# Patient Record
Sex: Female | Born: 1945 | Race: Black or African American | Hispanic: No | Smoking: Never smoker
Health system: Southern US, Community
[De-identification: ages and names within clinical notes are randomized; demographics above are authoritative.]

## PROBLEM LIST (undated history)

## (undated) DIAGNOSIS — K219 Gastro-esophageal reflux disease without esophagitis: Secondary | ICD-10-CM

## (undated) DIAGNOSIS — R112 Nausea with vomiting, unspecified: Secondary | ICD-10-CM

## (undated) DIAGNOSIS — J45909 Unspecified asthma, uncomplicated: Secondary | ICD-10-CM

## (undated) DIAGNOSIS — T7840XA Allergy, unspecified, initial encounter: Secondary | ICD-10-CM

## (undated) DIAGNOSIS — M1990 Unspecified osteoarthritis, unspecified site: Secondary | ICD-10-CM

## (undated) DIAGNOSIS — Z9889 Other specified postprocedural states: Secondary | ICD-10-CM

## (undated) DIAGNOSIS — R05 Cough: Secondary | ICD-10-CM

## (undated) DIAGNOSIS — G629 Polyneuropathy, unspecified: Secondary | ICD-10-CM

## (undated) DIAGNOSIS — E785 Hyperlipidemia, unspecified: Secondary | ICD-10-CM

## (undated) DIAGNOSIS — I671 Cerebral aneurysm, nonruptured: Secondary | ICD-10-CM

## (undated) DIAGNOSIS — I5032 Chronic diastolic (congestive) heart failure: Secondary | ICD-10-CM

## (undated) DIAGNOSIS — N186 End stage renal disease: Secondary | ICD-10-CM

## (undated) DIAGNOSIS — N189 Chronic kidney disease, unspecified: Secondary | ICD-10-CM

## (undated) DIAGNOSIS — Z992 Dependence on renal dialysis: Secondary | ICD-10-CM

## (undated) DIAGNOSIS — Z86010 Personal history of colonic polyps: Secondary | ICD-10-CM

## (undated) DIAGNOSIS — I10 Essential (primary) hypertension: Secondary | ICD-10-CM

## (undated) DIAGNOSIS — R059 Cough, unspecified: Secondary | ICD-10-CM

## (undated) HISTORY — DX: Allergy, unspecified, initial encounter: T78.40XA

## (undated) HISTORY — DX: Cough, unspecified: R05.9

## (undated) HISTORY — DX: Gastro-esophageal reflux disease without esophagitis: K21.9

## (undated) HISTORY — DX: Essential (primary) hypertension: I10

## (undated) HISTORY — DX: Unspecified osteoarthritis, unspecified site: M19.90

## (undated) HISTORY — PX: NASAL TURBINATE REDUCTION: SHX2072

## (undated) HISTORY — DX: Personal history of colonic polyps: Z86.010

## (undated) HISTORY — DX: Hyperlipidemia, unspecified: E78.5

## (undated) HISTORY — PX: CATARACT EXTRACTION: SUR2

## (undated) HISTORY — DX: Cough: R05

## (undated) HISTORY — PX: OTHER SURGICAL HISTORY: SHX169

---

## 2002-05-26 ENCOUNTER — Encounter: Admission: RE | Admit: 2002-05-26 | Discharge: 2002-08-24 | Payer: Self-pay | Admitting: General Practice

## 2005-03-11 ENCOUNTER — Emergency Department (HOSPITAL_COMMUNITY): Admission: EM | Admit: 2005-03-11 | Discharge: 2005-03-11 | Payer: Self-pay | Admitting: Family Medicine

## 2008-05-15 ENCOUNTER — Ambulatory Visit: Payer: Self-pay | Admitting: Cardiovascular Disease

## 2008-05-15 ENCOUNTER — Observation Stay (HOSPITAL_COMMUNITY): Admission: EM | Admit: 2008-05-15 | Discharge: 2008-05-17 | Payer: Self-pay | Admitting: Emergency Medicine

## 2008-05-17 ENCOUNTER — Encounter: Payer: Self-pay | Admitting: Cardiology

## 2008-06-08 ENCOUNTER — Ambulatory Visit: Payer: Self-pay | Admitting: Cardiovascular Disease

## 2009-10-04 ENCOUNTER — Emergency Department (HOSPITAL_COMMUNITY): Admission: EM | Admit: 2009-10-04 | Discharge: 2009-10-04 | Payer: Self-pay | Admitting: Emergency Medicine

## 2010-06-03 ENCOUNTER — Encounter: Admission: RE | Admit: 2010-06-03 | Discharge: 2010-06-03 | Payer: Self-pay | Admitting: Anesthesiology

## 2010-07-03 DIAGNOSIS — E119 Type 2 diabetes mellitus without complications: Secondary | ICD-10-CM | POA: Insufficient documentation

## 2010-07-03 DIAGNOSIS — R059 Cough, unspecified: Secondary | ICD-10-CM | POA: Insufficient documentation

## 2010-07-03 DIAGNOSIS — R05 Cough: Secondary | ICD-10-CM

## 2010-07-03 DIAGNOSIS — I10 Essential (primary) hypertension: Secondary | ICD-10-CM | POA: Insufficient documentation

## 2010-07-03 DIAGNOSIS — E785 Hyperlipidemia, unspecified: Secondary | ICD-10-CM | POA: Insufficient documentation

## 2010-07-08 ENCOUNTER — Ambulatory Visit: Payer: Self-pay | Admitting: Cardiovascular Disease

## 2010-07-08 ENCOUNTER — Encounter: Payer: Self-pay | Admitting: Cardiovascular Disease

## 2010-07-08 DIAGNOSIS — R079 Chest pain, unspecified: Secondary | ICD-10-CM | POA: Insufficient documentation

## 2010-07-08 DIAGNOSIS — R072 Precordial pain: Secondary | ICD-10-CM | POA: Insufficient documentation

## 2010-07-19 ENCOUNTER — Encounter: Payer: Self-pay | Admitting: Cardiovascular Disease

## 2010-07-19 ENCOUNTER — Ambulatory Visit: Payer: Self-pay

## 2010-08-09 ENCOUNTER — Ambulatory Visit: Payer: Self-pay | Admitting: Cardiovascular Disease

## 2010-08-11 DIAGNOSIS — Z860100 Personal history of colon polyps, unspecified: Secondary | ICD-10-CM | POA: Insufficient documentation

## 2010-08-11 HISTORY — DX: Personal history of colon polyps, unspecified: Z86.0100

## 2010-08-11 HISTORY — DX: Personal history of colonic polyps: Z86.010

## 2010-09-12 NOTE — Assessment & Plan Note (Signed)
Summary: bp high/pcp can't get it under control/mt  Medications Added PRAVASTATIN SODIUM 20 MG TABS (PRAVASTATIN SODIUM) 1 tab at bedtime HYDROCHLOROTHIAZIDE 12.5 MG CAPS (HYDROCHLOROTHIAZIDE) 2 caps once daily AMLODIPINE BESYLATE 5 MG TABS (AMLODIPINE BESYLATE) Take one tablet by mouth daily        Visit Type:  pt last seen 2009 Referring Provider:  Cathlean Cower Primary Provider:  Cathlean Cower  CC:  BP has been running very high and states when this happens she gets cp LUQ...edema/abdomen/fingers....denies any sob.  History of Present Illness: Paula [name] is a pleasant 65 year old African female who is originally from [name] and spends much of her time there each year who was last seen in our office in 2009 and is here today for follow up. She was initially seen by myself in the Emergency Room at [facility] on May 15, 2008, with complaints of chest pain.  She has a past medical history significant for hypertension, hyperlipidemia, diabetes mellitus and chronic cough.  At the time I saw her in the emergency room, she was complaining of a sharp pain occurring under her left breast.  The pain at time that seemed to me like it was a positional type pain and was worsened with coughing.  She underwent a nuclear perfusion stress study while in the hospital there that showed no evidence of ischemia.  She had been on Ace-inhibitor and this was stopped because of a cough.   She is here today for follow up. She tells me that she has had problems with blood pressure control over  the last few months. When her blood pressure goes up, she feel sharp left sided chest pain under her left breast. This only lasts for a few seconds. No associated SOB, palpitations, near syncope or syncope. No visual changes or headaches. Her BP has been as high as [id number] systolically.   Current Medications (verified): 1)  Metformin Hcl 500 Mg Tabs (Metformin Hcl) .Marland Kitchen.. 1 Tab Two Times A Day 2)  Pravastatin  Sodium 20 Mg Tabs (Pravastatin Sodium) .Marland Kitchen.. 1 Tab At Bedtime 3)  Aspirin 81 Mg Tbec (Aspirin) .... Take One Tablet By Mouth Daily 4)  Verapamil Hcl 80 Mg Tabs (Verapamil Hcl) .Marland Kitchen.. 1 Tab  Two Times A Day 5)  Hydrochlorothiazide 12.5 Mg Caps (Hydrochlorothiazide) .... 2 Caps Once Daily  Allergies: 1)  ! Ace Inhibitors  Past History:  Past Medical History: Reviewed history from 07/03/2010 and no changes required. HYPERLIPIDEMIA (ICD-272.4) HYPERTENSION (ICD-401.9) COUGH, CHRONIC (ICD-786.2) DIABETES MELLITUS, TYPE II (ICD-250.00)  Social History: Reviewed history from 07/03/2010 and no changes required. The patient is widowed and lives in [place].  She is   originally from [name].  She is a lifelong nonsmoker and denies use of  alcohol or illicit drugs.  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  Her youngest daughter lives here in [place].     Review of Systems       The patient complains of chest pain.  The patient denies fatigue, malaise, fever, weight gain/loss, vision loss, decreased hearing, hoarseness, palpitations, shortness of breath, prolonged cough, wheezing, sleep apnea, coughing up blood, abdominal pain, blood in stool, nausea, vomiting, diarrhea, heartburn, incontinence, blood in urine, muscle weakness, joint pain, leg swelling, rash, skin lesions, headache, fainting, dizziness, depression, anxiety, enlarged lymph nodes, easy bruising or bleeding, and environmental allergies.    Vital Signs:  Patient profile:   65 year old female Height:      63 inches  Weight:      154.50 pounds BMI:     27.47 Pulse rate:   80 / minute Pulse rhythm:   irregular BP sitting:   150 / 90  (left arm) Cuff size:   large  Vitals Entered By: Julaine Hua, CMA (July 08, 2010 4:30 PM)  Physical Exam  General:  General: Well developed, well nourished, NAD Musculoskeletal: Muscle strength 5/5 all ext Psychiatric: Mood and affect normal Neck: No JVD, no carotid  bruits, no thyromegaly, no lymphadenopathy. Lungs:Clear bilaterally, no wheezes, rhonci, crackles CV: RRR no murmurs, gallops rubs Abdomen: soft, NT, ND, BS present Extremities: No edema, pulses 2+.    EKG  Procedure date:  07/08/2010  Findings:      NSR. Nonspecific T wave flattening.  Poor R wave progression.   Impression & Recommendations:  Problem # 1:  HYPERTENSION (ICD-401.9) Will add Norvasc 5 mg by mouth Qdaily and check renal artery dopplers to exclude renal artery atherosclerosis.   The following medications were removed from the medication list:    Diovan 160 Mg Tabs (Valsartan) .Marland Kitchen... 1 tab once daily Her updated medication list for this problem includes:    Aspirin 81 Mg Tbec (Aspirin) .Marland Kitchen... Take one tablet by mouth daily    Verapamil Hcl 80 Mg Tabs (Verapamil hcl) .Marland Kitchen... 1 tab  two times a day    Hydrochlorothiazide 12.5 Mg Caps (Hydrochlorothiazide) .Marland Kitchen... 2 caps once daily    Amlodipine Besylate 5 Mg Tabs (Amlodipine besylate) .Marland Kitchen... Take one tablet by mouth daily  Orders: EKG w/ Interpretation (93000) Renal Artery Duplex (Renal Artery Duplex)  Problem # 2:  CHEST PAIN-PRECORDIAL (ICD-786.51) Atypical. Likely non-cardiac. Stress test in 2009 without ischemia.  Her updated medication list for this problem includes:    Aspirin 81 Mg Tbec (Aspirin) .Marland Kitchen... Take one tablet by mouth daily    Verapamil Hcl 80 Mg Tabs (Verapamil hcl) .Marland Kitchen... 1 tab  two times a day    Amlodipine Besylate 5 Mg Tabs (Amlodipine besylate) .Marland Kitchen... Take one tablet by mouth daily  Patient Instructions: 1)  Your physician recommends that you schedule a follow-up appointment in: 3-4 weeks. 2)  Your physician recommends that you continue on your current medications as directed. Please refer to the Current Medication list given to you today. 3)  Your physician has requested that you have a renal artery duplex. During this test, an ultrasound is used to evaluate blood flow to the kidneys. Allow one hour  for this exam. Do not eat after midnight the day before and avoid carbonated beverages. Take your medications as you usually do. Prescriptions: AMLODIPINE BESYLATE 5 MG TABS (AMLODIPINE BESYLATE) Take one tablet by mouth daily  #30 x 3   Entered by:   Whitney Jannett Celestine RN   Authorized by:   Lauree Chandler, MD   Signed by:   Mikle Bosworth RN on 07/08/2010   Method used:   Electronically to        [facility] [phone]* (retail)       [place], [place]  [address]       Ph: [phone] or [phone]       Fax: [phone]   RxID:   [id number]

## 2010-09-12 NOTE — Assessment & Plan Note (Signed)
Summary: per check out/sf  Medications Added HYDROCHLOROTHIAZIDE 25 MG TABS (HYDROCHLOROTHIAZIDE) Take one tablet by mouth daily. LORATADINE 10 MG TABS (LORATADINE) once daily TYLENOL 325 MG TABS (ACETAMINOPHEN) as needed CALTRATE 600+D PLUS 600-400 MG-UNIT CHEW (CALCIUM CARBONATE-VIT D-MIN) once daily PATANASE 0.6 % SOLN (OLOPATADINE HCL) 1-2 sprays each nostril two times a day QVAR 80 MCG/ACT AERS (BECLOMETHASONE DIPROPIONATE) 2 puffs once daily VENTOLIN HFA 108 (90 BASE) MCG/ACT AERS (ALBUTEROL SULFATE) [place] as directed      Allergies Added:   Visit Type:  Follow-up Referring Provider:  Cathlean Cower Primary Provider:  Cathlean Cower  CC:  no complaints.  History of Present Illness: Ms. Passarella is a pleasant 65 year old African female who is originally from [name] and spends much of her time there each year and is here today for follow up. She was initially seen by myself in the Emergency Room at [facility] on May 15, 2008, with complaints of chest pain.  She has a past medical history significant for hypertension, hyperlipidemia, diabetes mellitus and chronic cough.  At the time I saw her in the emergency room, she was complaining of a sharp pain occurring under her left breast.  The pain at time that seemed to me like it was a positional type pain and was worsened with coughing.  She underwent a nuclear perfusion stress study while in the hospital there that showed no evidence of ischemia.  She had been on Ace-inhibitor and this was stopped because of a cough.   She is here today for follow I last saw her in November 2011 and started her on Norvasc for uncontrolled BP. Her BP is much better. We also checked renal artery dopplers which were normal. She has  no complaints today except for chronic cough , unchanged since 1994 per pt. She had been on an Ace-inhibitor but no change in cough since it was stopped.   Current Medications (verified): 1)  Metformin Hcl 500 Mg Tabs  (Metformin Hcl) .Marland Kitchen.. 1 Tab Two Times A Day 2)  Pravastatin Sodium 20 Mg Tabs (Pravastatin Sodium) .Marland Kitchen.. 1 Tab At Bedtime 3)  Aspirin 81 Mg Tbec (Aspirin) .... Take One Tablet By Mouth Daily 4)  Verapamil Hcl 80 Mg Tabs (Verapamil Hcl) .Marland Kitchen.. 1 Tab  Two Times A Day 5)  Hydrochlorothiazide 25 Mg Tabs (Hydrochlorothiazide) .... Take One Tablet By Mouth Daily. 6)  Amlodipine Besylate 5 Mg Tabs (Amlodipine Besylate) .... Take One Tablet By Mouth Daily 7)  Loratadine 10 Mg Tabs (Loratadine) .... Once Daily 8)  Tylenol 325 Mg Tabs (Acetaminophen) .... As Needed 9)  Caltrate 600+d Plus 600-400 Mg-Unit Chew (Calcium Carbonate-Vit D-Min) .... Once Daily 10)  Patanase 0.6 % Soln (Olopatadine Hcl) .Marland Kitchen.. 1-2 Sprays Each Nostril Two Times A Day 11)  Qvar 80 Mcg/act Aers (Beclomethasone Dipropionate) .... 2 Puffs Once Daily 12)  Ventolin Hfa 108 (90 Base) Mcg/act Aers (Albuterol Sulfate) .... [place] As Directed  Allergies (verified): 1)  ! Ace Inhibitors  Past History:  Past Medical History: Reviewed history from 07/03/2010 and no changes required. HYPERLIPIDEMIA (ICD-272.4) HYPERTENSION (ICD-401.9) COUGH, CHRONIC (ICD-786.2) DIABETES MELLITUS, TYPE II (ICD-250.00)  Social History: Reviewed history from 07/03/2010 and no changes required. The patient is widowed and lives in [place].  She is   originally from [name].  She is a lifelong nonsmoker and denies use of  alcohol or illicit drugs.  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  Review of Systems  The patient denies fatigue, malaise, fever, weight gain/loss, vision loss, decreased hearing, hoarseness, chest pain, palpitations, shortness of breath, prolonged cough, wheezing, sleep apnea, coughing up blood, abdominal pain, blood in stool, nausea, vomiting, diarrhea, heartburn, incontinence, blood in urine, muscle weakness, joint pain, leg swelling, rash, skin lesions, headache,  fainting, dizziness, depression, anxiety, enlarged lymph nodes, easy bruising or bleeding, and environmental allergies.         Cough  Vital Signs:  Patient profile:   65 year old female Height:      63 inches Weight:      153.50 pounds BMI:     27.29 Pulse rate:   72 / minute BP sitting:   112 / 80  (left arm) Cuff size:   regular  Vitals Entered By: Hansel Feinstein CMA (August 09, 2010 9:40 AM)  Physical Exam  General:  General: Well developed, well nourished, NAD Musculoskeletal: Muscle strength 5/5 all ext Psychiatric: Mood and affect normal Neck: No JVD, no carotid bruits, no thyromegaly, no lymphadenopathy. Lungs:Clear bilaterally, no wheezes, rhonci, crackles CV: RRR no murmurs, gallops rubs Abdomen: soft, NT, ND, BS present Extremities: No edema, pulses 2+.    Arterial Doppler  Procedure date:  07/19/2010  Findings:      Normal renal arteries bilaterally, normal kidney size bilaterally. Normal abdominal aorta.   Impression & Recommendations:  Problem # 1:  HYPERTENSION (ICD-401.9) BP well controlled. Continue current therapy.   Her updated medication list for this problem includes:    Aspirin 81 Mg Tbec (Aspirin) .Marland Kitchen... Take one tablet by mouth daily    Verapamil Hcl 80 Mg Tabs (Verapamil hcl) .Marland Kitchen... 1 tab  two times a day    Hydrochlorothiazide 25 Mg Tabs (Hydrochlorothiazide) .Marland Kitchen... Take one tablet by mouth daily.    Amlodipine Besylate 5 Mg Tabs (Amlodipine besylate) .Marland Kitchen... Take one tablet by mouth daily  Patient Instructions: 1)  Your physician recommends that you schedule a follow-up appointment in: 1 year with Dr. Angelena Form 2)  Your physician recommends that you continue on your current medications as directed. Please refer to the Current Medication list given to you today. Prescriptions: AMLODIPINE BESYLATE 5 MG TABS (AMLODIPINE BESYLATE) Take one tablet by mouth daily  #30 x 11   Entered by:   Joelyn Oms RN   Authorized by:   Lauree Chandler, MD    Signed by:   Joelyn Oms RN on 08/09/2010   Method used:   Electronically to        [facility] [phone]* (retail)       [address] [place], [place]  [address]       Ph: [phone] or [phone]       Fax: [phone]   RxID:   [id number]

## 2010-10-30 LAB — DIFFERENTIAL
Basophils Absolute: 0.1 10*3/uL (ref 0.0–0.1)
Basophils Relative: 1 % (ref 0–1)
Eosinophils Absolute: 0.1 10*3/uL (ref 0.0–0.7)
Eosinophils Relative: 2 % (ref 0–5)
Lymphocytes Relative: 32 % (ref 12–46)
Lymphs Abs: 1.9 10*3/uL (ref 0.7–4.0)
Monocytes Absolute: 0.5 10*3/uL (ref 0.1–1.0)
Monocytes Relative: 8 % (ref 3–12)
Neutro Abs: 3.2 10*3/uL (ref 1.7–7.7)
Neutrophils Relative %: 57 % (ref 43–77)

## 2010-10-30 LAB — URINALYSIS, ROUTINE W REFLEX MICROSCOPIC
Bilirubin Urine: NEGATIVE
Glucose, UA: NEGATIVE mg/dL
Hgb urine dipstick: NEGATIVE
Ketones, ur: NEGATIVE mg/dL
Nitrite: NEGATIVE
Protein, ur: 30 mg/dL — AB
Specific Gravity, Urine: 1.01 (ref 1.005–1.030)
Urobilinogen, UA: 0.2 mg/dL (ref 0.0–1.0)
pH: 8 (ref 5.0–8.0)

## 2010-10-30 LAB — BASIC METABOLIC PANEL
BUN: 17 mg/dL (ref 6–23)
CO2: 30 mEq/L (ref 19–32)
Calcium: 9.6 mg/dL (ref 8.4–10.5)
Chloride: 101 mEq/L (ref 96–112)
Creatinine, Ser: 0.95 mg/dL (ref 0.4–1.2)
GFR calc Af Amer: >60 mL/min (ref >60)
GFR calc non Af Amer: 59 mL/min — ABNORMAL LOW (ref >60)
Glucose, Bld: 92 mg/dL (ref 70–99)
Potassium: 3.6 mEq/L (ref 3.5–5.1)
Sodium: 140 mEq/L (ref 135–145)

## 2010-10-30 LAB — POCT CARDIAC MARKERS
CKMB, poc: <1 ng/mL — ABNORMAL LOW (ref 1.0–8.0)
Myoglobin, poc: 75 ng/mL (ref 12–200)
Troponin i, poc: <0.05 ng/mL (ref 0.00–0.09)

## 2010-10-30 LAB — HEPATIC FUNCTION PANEL
ALT: 20 U/L (ref 0–35)
AST: 24 U/L (ref 0–37)
Albumin: 4.2 g/dL (ref 3.5–5.2)
Alkaline Phosphatase: 56 U/L (ref 39–117)
Bilirubin, Direct: <0.1 mg/dL (ref 0.0–0.3)
Total Bilirubin: 0.7 mg/dL (ref 0.3–1.2)
Total Protein: 7.3 g/dL (ref 6.0–8.3)

## 2010-10-30 LAB — RAPID URINE DRUG SCREEN, HOSP PERFORMED
Amphetamines: NOT DETECTED
Barbiturates: NOT DETECTED
Benzodiazepines: NOT DETECTED
Cocaine: NOT DETECTED
Opiates: NOT DETECTED
Tetrahydrocannabinol: NOT DETECTED

## 2010-10-30 LAB — CBC
HCT: 39.6 % (ref 36.0–46.0)
Hemoglobin: 13.5 g/dL (ref 12.0–15.0)
MCHC: 34 g/dL (ref 30.0–36.0)
MCV: 87.3 fL (ref 78.0–100.0)
Platelets: 193 10*3/uL (ref 150–400)
RBC: 4.54 MIL/uL (ref 3.87–5.11)
RDW: 13.3 % (ref 11.5–15.5)
WBC: 5.8 10*3/uL (ref 4.0–10.5)

## 2010-10-30 LAB — URINE MICROSCOPIC-ADD ON

## 2010-10-30 LAB — URINE CULTURE
Colony Count: NO GROWTH
Culture: NO GROWTH

## 2010-10-30 LAB — LIPASE, BLOOD: Lipase: 50 U/L (ref 11–59)

## 2010-10-30 LAB — CK TOTAL AND CKMB (NOT AT ARMC)
CK, MB: 1.4 ng/mL (ref 0.3–4.0)
Relative Index: 0.9 (ref 0.0–2.5)
Total CK: 160 U/L (ref 7–177)

## 2010-10-30 LAB — TROPONIN I: Troponin I: 0.01 ng/mL (ref 0.00–0.06)

## 2010-12-24 NOTE — H&P (Signed)
NAME:  Paula [name], Paula [name]           ACCOUNT NO.:  [id number]   MEDICAL RECORD NO.:  [id number]          PATIENT TYPE:  EMS   LOCATION:  ED                           FACILITY:  [facility]   PHYSICIAN:  Jacquelynn Cree, M.D.   DATE OF BIRTH:  02-23-1946   DATE OF ADMISSION:  05/15/2008  DATE OF DISCHARGE:                              HISTORY & PHYSICAL   PRIMARY CARE PHYSICIAN:  Cathlean Cower, MD.   CHIEF COMPLAINT:  Chest pain.   HISTORY OF PRESENT ILLNESS:  The patient is a 65 year old female with a  2 day history of chest pain.  The patient describes the pain as coming  on suddenly while asleep at 1 a.m. in the morning 2 nights ago.  She  describes the pain as left-sided and sharp.  It was associated with  difficulty breathing and got worse when she took deep breaths in.  It  was also worse with bending down.  The pain later became heavy and she  described it as a level 8/10 at its worst.  She states the pain did not  radiate anywhere when it first came on.  She subsequently took aspirin  and the pain diminished somewhat.  Over the weekend, the patient took  aspirin twice a day which seemed to keep the pain under some control,  but has become concerned because of an increasing sense of heaviness in  her chest.  She also describes some associated head pain and right arm  pain now.  States that when the pain first started, I could not move.  There was no associated diaphoresis, nausea or vomiting.   PAST MEDICAL HISTORY:  1. Hypertension.  2. Diabetes.  3. Dyslipidemia.  4. Chronic cough with postnasal drip.   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   FAMILY HISTORY:  The patient's mother died at 29 from old age.  She also  had cerebrovascular disease with history of stroke and hypertension  prior to her death.  The patient's father died at 36 from a stomach  related illness.  She has 1 brother who is healthy and 1 sister who is  healthy.  One other sister is bed ridden and has asthma.   SOCIAL HISTORY:  The patient is widowed.  She lives in [place], but  is originally from [name].  She is a lifelong nonsmoker.  She denies  alcohol or drug use.  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   ALLERGIES:  CHLOROQUINE causes a pruritus/rash.   CURRENT MEDICATIONS:  1. Metformin 500 mg b.i.d.  2. Lisinopril 20 mg daily.  3. Pravastatin 20 mg daily.  4. Aspirin p.r.n.   REVIEW OF SYSTEMS:  The patient has chronic cough with clear sputum.  This is occasionally associated with bouts of vomiting.  Chronic ear  pain.  No fever or chills.  Otherwise, a 14-point review of systems is  unremarkable.   PHYSICAL EXAMINATION:  VITAL SIGNS:  Temperature 98.4, pulse 73,  respirations 18, blood pressure 156/82, O2 saturation 100% on room air.  GENERAL:  Well-developed, slightly obese female who is in no acute  distress.  HEENT:  Normocephalic, atraumatic.  PERRL.  EOMI.  Oropharynx is clear.  NECK:  Supple, no thyromegaly, no lymphadenopathy, no jugular venous  distention.  CHEST:  Lungs clear to auscultation bilaterally with good  air movement.  HEART:  Regular rate and rhythm.  No murmurs, rubs, or gallops.  ABDOMEN:  Soft, nontender, nondistended with normoactive bowel sounds.  EXTREMITIES:  No clubbing, edema, or cyanosis.  SKIN:  Warm and dry.  No rashes.  NEUROLOGIC:  The patient is alert and oriented x3.  Cranial nerves II-  XII are grossly intact.  Nonfocal.   DIAGNOSTICS:  1. Chest x-ray shows no active cardiopulmonary disease.  2. A 12-lead EKG shows normal sinus rhythm at 74 beats per minute.  No      ST/T wave abnormalities appreciable.   LABORATORY DATA:  Sodium is 139, potassium 4.1, chloride 106, bicarb 23,  BUN 22, creatinine 1.14, glucose 144, white blood cell count is 5.1,  hemoglobin 13.3, hematocrit 39.9, platelets 185, D-dimer is 0.24.  Point  of care  cardiac markers are negative x1.   ASSESSMENT/PLAN:  1. Chest pain:  The patient has multiple risk factors including      hypertension, diabetes, and dyslipidemia along with obesity.  We      will therefore need a cardiology consultation for consideration of      stress test at a minimum.  We will admit the patient to a telemetry      unit and rule out acute coronary syndrome with serial enzymes.  Her      first set of cardiac markers in the ER is negative.  We will put      her on aspirin, p.r.n. morphine and nitroglycerin for recurrent      chest pain.  We will check a fasting lipid panel to make sure her      lipids are well controlled.  2. Hypertension:  The patient's blood pressure is currently      suboptimally controlled.  We will monitor this over the next 24      hours and if needed adjust her antihypertensives.  3. Dyslipidemia:  The patient will be continued on her usual dose of      statin therapy.  We will check a fasting lipid panel to ensure that      she is well controlled on this dose.  4. Diabetes:  We will check the patient's hemoglobin A1c value to      determine her overall glycemic control.  We will hold her metformin      as she may need a contrasted study while in the hospital.  We will      put her on sliding-scale insulin  5. Chronic cough:  We will treat this with Delsym and Protonix.  6. Prophylaxis:  We will initiate DVT prophylaxis with subcutaneous      heparin.  We will put her on proton pump inhibitor therapy for      treatment of her chronic cough and for stress ulcer prophylaxis.      Jacquelynn Cree, M.D.  Electronically Signed     CR/MEDQ  D:  05/15/2008  T:  05/16/2008  Job:  [id number]

## 2010-12-24 NOTE — Consult Note (Signed)
NAME:  ITZAYANA, PILLER           ACCOUNT NO.:  [id number]   MEDICAL RECORD NO.:  [id number]          PATIENT TYPE:  OBV   LOCATION:  0106                         FACILITY[facility]  [facility]   PHYSICIAN:  Lauree Chandler, MDDATE OF BIRTH:  05-02-1946   DATE OF CONSULTATION:  05/15/2008  DATE OF DISCHARGE:                                 CONSULTATION   Chest pain.   HISTORY OF PRESENT ILLNESS:  Ms. Rajput is a pleasant 65 year old  female with a two-day history of left-sided chest pain that she first  noticed while asleep several days ago.  She describes the pain as being  very sharp and occurring underneath her left breast.  There does not  seem to be any radiation of the pain.  There is a mild association with  shortness of breath but no association with palpitations, diaphoresis,  nausea, or vomiting.  The pain has not radiated.  There is no resolution  of the pain while at rest.  There does seem to be a positional component  to the left-sided pain.  It seems to be worse when she is bending over.  She states that she has had pain like this several times before;  however, her pain is usually more of a dull pain.  She says that she  took several aspirin and had no relief of the pain and presented to the  emergency department.  Currently, she describes a very mild, dull pain  over the left chest wall underneath the left breast.  She denies any  lower extremity edema, PND, orthopnea, dizziness, near syncope, or  syncope.   PAST MEDICAL HISTORY:  1. Hypertension.  2. Diabetes mellitus.  3. Hyperlipidemia.  4. Chronic cough.  5. Removal of bilateral benign masses in the axilla.   ALLERGIES:  CHLORAQUIN, which caused a rash.   MEDICATIONS:  1. Metformin 500 mg twice daily.  2. Lisinopril 20 mg once daily.  3. Pravastatin 20 mg once daily.  4. Aspirin p.r.n.   SOCIAL HISTORY:  The patient is widowed and lives in [place].  She is  originally from [name].  She is a lifelong  nonsmoker and denies the use  of alcohol or illicit drugs.  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   FAMILY HISTORY:  Patient's mother died at 77 from natural causes but did  have a stroke and hypertension.  Father died at age 31 from stomach-  related reasons.  She has one brother who is healthy and two sisters.  One of her sisters is bedridden with chronic lung disease, and the other  is healthy.   REVIEW OF SYSTEMS:  Positive for chronic cough productive of clear  sputum.  She denies any fevers or chills.  Otherwise, as noted in the  history of present illness.   PHYSICAL EXAMINATION:  Temperature 98.4, pulse 73 and regular,  respirations 18 and nonlabored.  Blood pressure 166/82.  Oxygen  saturations of 100% on room air.  GENERAL:  Alert and oriented x3 in no acute distress.  NEURO:  Nonfocal.  Cranial nerves II-XII are grossly intact.  NECK:  No JVD.  No carotid bruits.  No lymphadenopathy.  No thyromegaly.  SKIN:  Warm and dry.  Oropharynx clear.  Mucous membranes moist.  LUNGS:  Clear to auscultation bilaterally with no wheezes, rhonchi, or  crackles noted.  CARDIOVASCULAR:  Regular rate and rhythm without murmurs, rubs or  gallops noted.  ABDOMEN:  Soft, nontender, nondistended.  Bowel sounds are present.  EXTREMITIES:  No evidence of edema.  Pulses are 2+ in all extremities.   DIAGNOSTIC STUDIES:  EKG obtained here in the emergency department with  normal sinus rhythm with a ventricular rate of 74 beats per minute and  no ischemic changes.   Chest x-ray:  No active cardiopulmonary disease.   Laboratory values from admission show a sodium of 139, potassium 4.1,  creatinine 1.14, hemoglobin 13.3, platelets 185.  White blood cell count  5.1.  D-dimer 0.24.  Troponin less than 0.05.   ASSESSMENT/PLAN:  This is a pleasant 65 year old African female with  several risk factors for coronary artery disease, including  hypertension, hyperlipidemia, and  diabetes mellitus, who presents to the  emergency department with complaints of two days of atypical left-sided  chest discomfort.  I agree that it would be most appropriate to admit  her to telemetry and rule out a myocardial infarction with serial  cardiac enzymes.  Her symptoms sound atypical, but her risk factors are  significant.  If she rules out for a myocardial infarction, then I think  it would be appropriate to perform an exercise treadmill nuclear stress  test tomorrow prior to discharge.  If she does rule in for a myocardial  infarction, then we would plan on transferring her over to [facility]  for a left heart catheterization tomorrow.      Lauree Chandler, MD  Electronically Signed     CM/MEDQ  D:  05/15/2008  T:  05/15/2008  Job:  [id number]

## 2010-12-24 NOTE — Discharge Summary (Signed)
NAME:  Paula [name], COMTE           ACCOUNT NO.:  [id number]   MEDICAL RECORD NO.:  [id number]          PATIENT TYPE:  OBV   LOCATION:  [id number]                         FACILITY:  [facility]   PHYSICIAN:  Jacquelynn Cree, M.D.   DATE OF BIRTH:  11-01-1945   DATE OF ADMISSION:  05/15/2008  DATE OF DISCHARGE:  05/17/2008                               DISCHARGE SUMMARY   PRIMARY CARE PHYSICIAN:  Dr. Amadeo Garnet.   CARDIOLOGIST:  Dr. Darlina Guys.   DISCHARGE DIAGNOSES:  1. Noncardiac chest pain.  2. Hypertension.  3. Dyslipidemia.  4. Diabetes mellitus.  5. Chronic cough.   DISCHARGE MEDICATIONS:  1. Metformin 500 mg b.i.d.  2. Lisinopril 20 mg daily.  3. Pravastatin 40 mg daily.  4. Nifedipine 20 mg t.i.d.  5. Protonix 40 mg daily.  6. Delsym 60 mg q.12 hours.  7. Aspirin 81 mg daily.   CONSULTATIONS:  1. Dr. Lauree Chandler for Cardiology.   BRIEF ADMISSION HPI:  The patient is a 65 year old female with multiple  cardiac risk factors who presented to the hospital with chief complaints  of chest pain.  For the full details, please see my dictated H&P.   PROCEDURES AND DIAGNOSTIC STUDIES:  1. Chest X-Ray:  On May 15, 2008 showed no active cardiopulmonary      disease.  2. Stress Myoview done on May 17, 2008:  Images pending.  No      complaints of chest pain or EKG changes during the test.   DISCHARGE LABORATORY VALUES:  Sodium was 142, potassium 3.6, chloride  111, bicarb 24, BUN 18, creatinine 1.12, glucose 104.  Cardiac markers  were negative x3.   HOSPITAL COURSE BY PROBLEM:  1. Chest pain:  The patient was admitted to the telemetry unit and      monitored closely.  Acute coronary syndrome was ruled out by three      sets of negative enzymes.  Because of her cardiac risk factor      profile, a cardiology consultation was requested and kindly      provided by the [facility].  They did recommend a stress test      and this was completed on May 17, 2008.  At this  point the      images are pending but the preliminary reading certainly appears to      be normal.  As soon as we get confirmation that the stress test is      negative, she will be discharged home.  Continued risk factor      modification will be stressed to the patient on discharge.  2. Hypertension:  The patient's lisinopril was resumed.  3. Dyslipidemia:  The patient's total cholesterol was well controlled      at 183 but her HDL was low at 33 and her LDL was high at 124.  We      have increased her pravastatin to 40 mg daily.  She should have a      follow-up fasting lipid panel and check of her liver function      studies in 6 weeks.  4. Diabetes:  The patient has excellent outpatient diabetic control      given her hemoglobin A1c of 6.5.  She should continue on metformin      at discharge.  She was covered with sliding scale insulin while in      the hospital in case she had to have a contrasted study.  .  5. Chronic cough:  The patient was put on a combination of Delsym and      Protonix which seems to have improved her cough.  She will be      discharged on these medicines.  If she continues to have problems      with cough, consideration for changing her ACE inhibitor to an ARB      can be made by her primary care physician.   DISPOSITION:  Patient is medically stable and will be discharged home  soon as the final stress test results are available.  She has follow-up  scheduled with Dr. Angelena Form on October 29 at 10:30 a.m.  She should  follow up with her primary care physician in 1-2 weeks.      Jacquelynn Cree, M.D.  Electronically Signed     CR/MEDQ  D:  05/17/2008  T:  05/17/2008  Job:  [id number]   cc:   Lauree Chandler, MD  [address] [place] [address]

## 2010-12-24 NOTE — Assessment & Plan Note (Signed)
[facility] OFFICE NOTE   NAME:Paula [name], Paula [name]                  MRN:          [id number]  DATE:06/08/2008                            DOB:          [birth year]    PRIMARY CARE PHYSICIAN:  Cathlean Cower, MD   HISTORY OF PRESENT ILLNESS:  Ms. Paula [name] is a pleasant 65 year old  African female who is originally from [name] and spends much of her time  their each year who was initially seen by myself in the Emergency Room  at [facility] on May 15, 2008, with complaints of chest  pain.  She has a past medical history significant for hypertension,  hyperlipidemia, diabetes mellitus and chronic cough.  At the time I saw  her in the emergency room, she was complaining of a sharp pain occurring  under her left breast.  There was no radiation of the pain.  There was a  mild association with shortness of breath at that time, but no  associated palpitations, diaphoresis, nausea, or vomiting.  The pain at  time that seemed to me like it was a positional type pain and was  worsened with coughing.  She underwent a nuclear perfusion stress study  while in the hospital there that showed no evidence of ischemia.  She  comes into my office today for a hospital followup.  She tells me that  she has had no recurrence of her chest pain, but does occasionally  notice very mild pain under her left breast when she had deep coughing.  Her only complaint today really is a severe cough for the last several  months.  This is a nonproductive cough.  Occasionally, if she does  produce sputum, it is clear.  She thinks that is mostly associated with  postnasal drip, but tells me that she has been on an ACE inhibitor for  some time.  She has no other complaints today.  She denies palpitations,  dizziness, near syncope, syncope, abdominal pain, nausea, vomiting,  diaphoresis, orthopnea, PND, or lower extremity edema.   PAST  MEDICAL HISTORY:  1. Hypertension.  2. Hyperlipidemia.  3. Diabetes mellitus.  4. Chronic cough, on ACE inhibitor therapy.  5. Removal of bilateral benign masses in the axilla.   ALLERGIES:  CHLOROQUINE, which causes a rash.   CURRENT MEDICATIONS:  1. Metformin 500 mg twice daily.  2. Lisinopril 20 mg once daily.  3. Pravastatin 40 mg once daily.  4. Protonix 40 mg once daily.  5. Enteric-coated aspirin 81 mg once daily.  6. Verapamil 80 mg twice daily.  7. Metoclopramide 5 mg 3 times daily.  8. Ranitidine 300 mg once daily.   SOCIAL HISTORY:  The patient is widowed and lives in [place].  She is  originally from [name].  She is a lifelong nonsmoker and denies use of  alcohol or illicit drugs.  She is currently disabled, but used to work  as a Education officer, museum.  Her youngest daughter lives here in [place].   FAMILY HISTORY:  The patient's mother died at 45 from natural causes,  but did have a stroke  and hypertension.  Her father died at age 44 from  stomach related reasons.  She has 1 brother who is healthy and 2  sisters.  One for sisters is bedridden with chronic lung disease and  other is healthy.   REVIEW OF SYSTEMS:  As stated in history of present illness and is  otherwise negative.  The only positives in her review of systems are  currently the chronic cough, which is productive of clear sputum.  She  denies any fevers or chills.   PHYSICAL EXAMINATION:  VITAL SIGNS:  Blood pressure 182/93 (on recheck  it was 170/90), pulse 69 and regular, respirations 12 and unlabored,  weight 154 pounds.  GENERAL:  She is a pleasant 65 year old African female who is in no  acute distress.  She is alert and oriented x3.  SKIN:  Warm and dry.  HEENT:  Normal.  NEUROLOGICAL:  No focal neurological deficits.  PSYCHIATRIC:  Mood and affect are appropriate.  MUSCULOSKELETAL:  Muscle strength and tone is normal.  LUNGS:  Clear to auscultation bilaterally with no wheezes, rhonchi, or   crackles noted.  CARDIOVASCULAR:  Regular rate and rhythm without murmurs, gallops, or  rubs noted.  ABDOMEN:  Soft, nontender, and nondistended.  Bowel sounds are present.  EXTREMITIES:  No evidence of edema.  Pulses are 2+ in all extremities.   DIAGNOSTIC STUDIES:  1. A 12-lead EKG shows normal sinus rhythm with first-degree AV block.      The PR interval is 216 milliseconds.  There is poor R-wave      progression through the precordial leads.  2. Exercise myocardial perfusion study performed on May 17, 2008,      shows no evidence of inducible ischemia.  The left ventricular wall      motion and systolic function was normal.   ASSESSMENT AND PLAN:  This is a pleasant 65 year old African female with  prior history of diabetes mellitus, hypertension and hyperlipidemia who  is seen today for hospital followup after admission with complaints of  left-sided chest pain.  As noted above, the patient's stress test showed  no evidence of inducible ischemia.  Her overall left ventricular  function was normal on the nuclear study.  She has had no recurrence of  chest pain.  I think that most of her complaints of chest discomfort are  related to coughing, which is most likely related to her ACE inhibitor.  I will stop her ACE inhibitor today and we will start her on Diovan 160  mg once daily.  I will also start her on hydrochlorothiazide 12.5 mg  once daily for her significantly elevated blood pressure.  I do not  think that any further cardiac workup is needed at the current time.  She will continue to  follow up with her primary care physician and we will have further  titration of her antihypertensive medications in the primary care  office.     Lauree Chandler, MD  Electronically Signed    CM/MedQ  DD: 06/08/2008  DT: 06/09/2008  Job #: [id number]   cc:   Cathlean Cower, MD

## 2011-01-13 ENCOUNTER — Other Ambulatory Visit: Payer: Self-pay | Admitting: Allergy and Immunology

## 2011-01-13 ENCOUNTER — Ambulatory Visit
Admission: RE | Admit: 2011-01-13 | Discharge: 2011-01-13 | Disposition: A | Discharge status: Home / Self Care | Payer: Medicaid Other | Source: Ambulatory Visit | Attending: Allergy and Immunology | Admitting: Allergy and Immunology

## 2011-01-13 DIAGNOSIS — J329 Chronic sinusitis, unspecified: Secondary | ICD-10-CM

## 2011-04-07 ENCOUNTER — Encounter: Payer: Self-pay | Admitting: Pulmonary Disease

## 2011-04-08 ENCOUNTER — Encounter: Payer: Self-pay | Admitting: Critical Care Medicine

## 2011-04-08 ENCOUNTER — Ambulatory Visit (INDEPENDENT_AMBULATORY_CARE_PROVIDER_SITE_OTHER): Payer: Medicaid Other | Admitting: Critical Care Medicine

## 2011-04-08 DIAGNOSIS — E119 Type 2 diabetes mellitus without complications: Secondary | ICD-10-CM

## 2011-04-08 DIAGNOSIS — K219 Gastro-esophageal reflux disease without esophagitis: Secondary | ICD-10-CM | POA: Insufficient documentation

## 2011-04-08 DIAGNOSIS — R059 Cough, unspecified: Secondary | ICD-10-CM

## 2011-04-08 DIAGNOSIS — E785 Hyperlipidemia, unspecified: Secondary | ICD-10-CM

## 2011-04-08 DIAGNOSIS — I10 Essential (primary) hypertension: Secondary | ICD-10-CM

## 2011-04-08 DIAGNOSIS — R05 Cough: Secondary | ICD-10-CM

## 2011-04-08 MED ORDER — METOCLOPRAMIDE HCL 10 MG PO TABS: ORAL_TABLET | ORAL | Status: DC

## 2011-04-08 MED ORDER — HYDROCOD POLST-CPM POLST ER 10-8 MG PO CP12: 1.0000 | ORAL_CAPSULE | Freq: Two times a day (BID) | ORAL | Status: DC | PRN

## 2011-04-08 MED ORDER — BENZONATATE 100 MG PO CAPS: ORAL_CAPSULE | ORAL | Status: AC

## 2011-04-08 MED ORDER — DEXLANSOPRAZOLE 60 MG PO CPDR: 60.0000 mg | DELAYED_RELEASE_CAPSULE | Freq: Every day | ORAL | Status: DC

## 2011-04-08 NOTE — Patient Instructions (Signed)
Stop omeprazole Start Dexilant one daily Start Reglan one 4 times a day with meals and at bedtime Stop Qvar Use cyclic cough protocol Reflux diet

## 2011-04-08 NOTE — Progress Notes (Addendum)
Subjective:    Patient ID: Paula [name], female    DOB: 1946/07/18, 65 y.o.   MRN: [id number] 65 y.o. AAF with cyclical cough d/t GERD, upper airway instability HPI Comments: Cough since 1993.  Coughing seasonally , but now coughing constantly, phlegm into nose and mouth. Saw ENT and was neg w/u  Cough This is a chronic problem. The current episode started more than 1 year ago. The problem has been gradually worsening. The problem occurs constantly. The cough is productive of sputum (mucus is clear , no blood). Associated symptoms include ear congestion, ear pain, headaches, heartburn, nasal congestion, postnasal drip, rhinorrhea, a sore throat, shortness of breath and wheezing. Pertinent negatives include no chest pain, chills, fever, hemoptysis, myalgias or rash. Associated symptoms comments: Has a dry scratchy throat If eats food will regurgitate . The symptoms are aggravated by stress, dust, fumes, pollens, lying down and exercise (allergy to mold and dust, coughs all night long). She has tried steroid inhaler and a beta-agonist inhaler for the symptoms. The treatment provided no relief. Her past medical history is significant for environmental allergies. There is no history of asthma, bronchitis, COPD, emphysema or pneumonia.   Past Medical History  Diagnosis Date  . Allergic rhinitis   . Cough   . GERD (gastroesophageal reflux disease)   . Diabetes mellitus   . Hypertension   . Hyperlipidemia      Family History  Problem Relation Age of Onset  . Asthma Sister      History   Social History  . Marital Status: Widowed    Spouse Name: N/A    Number of Children: N/A  . Years of Education: N/A   Occupational History  . Not on file.   Social History Main Topics  . Smoking status: Never Smoker   . Smokeless tobacco: Never Used  . Alcohol Use: No  . Drug Use: No  . Sexually Active: Not on file   Other Topics Concern  . Not on file   Social History Narrative  . No  narrative on file     Allergies  Allergen Reactions  . Ace Inhibitors     REACTION: chronic cough     Outpatient Prescriptions Prior to Visit  Medication Sig Dispense Refill  . albuterol (VENTOLIN HFA) 108 (90 BASE) MCG/ACT inhaler Inhale 2 puffs into the lungs every 6 (six) hours as needed.        Marland Kitchen amLODipine (NORVASC) 5 MG tablet Take 5 mg by mouth daily.        Marland Kitchen aspirin 81 MG tablet Take 81 mg by mouth daily.        . verapamil (CALAN) 80 MG tablet Take 80 mg by mouth 2 (two) times daily.       . beclomethasone (QVAR) 80 MCG/ACT inhaler Inhale 2 puffs into the lungs 2 (two) times daily.        . Calcium-Vitamin D (CALTRATE 600 PLUS-VIT D PO)        . hydrochlorothiazide (,MICROZIDE/HYDRODIURIL,) 12.5 MG capsule Take 12.5 mg by mouth daily.        Marland Kitchen loratadine (CLARITIN) 10 MG tablet Take 10 mg by mouth daily.        . metFORMIN (GLUCOPHAGE) 850 MG tablet Take 850 mg by mouth 2 (two) times daily with a meal.        . Olopatadine HCl (PATANASE) 0.6 % SOLN Place 2 sprays into the nose 2 (two) times daily.       Marland Kitchen  Pseudoeph-Chlorphen-Hydrocod (ZUTRIPRO) 60-4-5 MG/5ML SOLN             Review of Systems  Constitutional: Positive for fatigue. Negative for fever, chills, diaphoresis, activity change, appetite change and unexpected weight change.  HENT: Positive for ear pain, congestion, sore throat, rhinorrhea, sneezing, voice change, postnasal drip, sinus pressure and tinnitus. Negative for hearing loss, nosebleeds, facial swelling, mouth sores, trouble swallowing, neck pain, neck stiffness, dental problem and ear discharge.   Eyes: Positive for itching. Negative for photophobia, discharge and visual disturbance.  Respiratory: Positive for cough, choking, shortness of breath and wheezing. Negative for apnea, hemoptysis, chest tightness and stridor.   Cardiovascular: Positive for palpitations. Negative for chest pain and leg swelling.  Gastrointestinal: Positive for heartburn, nausea,  vomiting and abdominal distention. Negative for abdominal pain, constipation and blood in stool.  Genitourinary: Negative for dysuria, urgency, frequency, hematuria, flank pain, decreased urine volume and difficulty urinating.  Musculoskeletal: Positive for back pain. Negative for myalgias, joint swelling, arthralgias and gait problem.  Skin: Negative for color change, pallor and rash.  Neurological: Positive for dizziness and headaches. Negative for tremors, seizures, syncope, speech difficulty, weakness, light-headedness and numbness.  Hematological: Positive for environmental allergies. Negative for adenopathy. Does not bruise/bleed easily.  Psychiatric/Behavioral: Positive for sleep disturbance and agitation. Negative for confusion. The patient is not nervous/anxious.        Objective:   Physical Exam  Filed Vitals:   04/08/11 1044  BP: 130/88  Pulse: 77  Temp: 98.8 F (37.1 C)  TempSrc: Oral  Height: 5\' 3"  (1.6 m)  Weight: 162 lb 9.6 oz (73.755 kg)  SpO2: 100%    Gen: Pleasant, well-nourished, in no distress,  normal affect  ENT: No lesions,  mouth clear,  oropharynx clear, no postnasal drip  Neck: No JVD, no TMG, no carotid bruits  Lungs: No use of accessory muscles, no dullness to percussion,prominent pseudowheeze  Cardiovascular: RRR, heart sounds normal, no murmur or gallops, no peripheral edema  Abdomen: soft and NT, no HSM,  BS normal  Musculoskeletal: No deformities, no cyanosis or clubbing  Neuro: alert, non focal  Skin: Warm, no lesions or rashes  CXR: NAD Arlyce Harman 04/08/11: normal CT sinus 6/12: normal    Assessment & Plan:   COUGH, CHRONIC 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  No true lower lung disease Note CXR no active disease,  Note no real sinus disease Note spirometry is normal  Plan Stop omeprazole Start Dexilant one daily Start Reglan one 4 times a day with meals and at bedtime Stop Qvar Use cyclic cough  protocol Reflux diet     Updated Medication List Outpatient Encounter Prescriptions as of 04/08/2011  Medication Sig Dispense Refill  . acetaminophen (TYLENOL) 500 MG tablet Take 500 mg by mouth as needed.        Marland Kitchen albuterol (VENTOLIN HFA) 108 (90 BASE) MCG/ACT inhaler Inhale 2 puffs into the lungs every 6 (six) hours as needed.        Marland Kitchen amLODipine (NORVASC) 5 MG tablet Take 5 mg by mouth daily.        Marland Kitchen aspirin 81 MG tablet Take 81 mg by mouth daily.        . Calcium Carbonate-Vitamin D (CALCIUM 600+D) 600-200 MG-UNIT TABS Take 1 tablet by mouth daily.        . cetirizine (ZYRTEC) 10 MG tablet Take 10 mg by mouth daily.        . hydrochlorothiazide 25 MG tablet Take  25 mg by mouth daily.        . metFORMIN (GLUCOPHAGE) 500 MG tablet Take 500 mg by mouth 2 (two) times daily.        Vladimir Faster Glycol-Propyl Glycol 0.4-0.3 % SOLN Apply 1 drop to eye 3 (three) times daily.        . verapamil (CALAN) 80 MG tablet Take 80 mg by mouth 2 (two) times daily.       Marland Kitchen DISCONTD: beclomethasone (QVAR) 80 MCG/ACT inhaler Inhale 2 puffs into the lungs 2 (two) times daily.        Marland Kitchen DISCONTD: Dextromethorphan Polistirex (DELSYM PO) Take by mouth. 1 lozenge as needed       . DISCONTD: omeprazole (PRILOSEC) 20 MG capsule Take 20 mg by mouth daily.        . benzonatate (TESSALON) 100 MG capsule Take 1-2 4 times daily with cyclic cough protocol  90 capsule  4  . dexlansoprazole (DEXILANT) 60 MG capsule Take 1 capsule (60 mg total) by mouth daily.  30 capsule  6  . Hydrocod Polst-Chlorphen Polst (TUSSICAPS) 10-8 MG CP12 Take 1 capsule by mouth 2 (two) times daily as needed.  10 each  1  . metoCLOPramide (REGLAN) 10 MG tablet One three times a day at meals and at bedtime  40 tablet  1  . DISCONTD: Calcium-Vitamin D (CALTRATE 600 PLUS-VIT D PO)        . DISCONTD: hydrochlorothiazide (,MICROZIDE/HYDRODIURIL,) 12.5 MG capsule Take 12.5 mg by mouth daily.        Marland Kitchen DISCONTD: loratadine (CLARITIN) 10 MG tablet Take  10 mg by mouth daily.        Marland Kitchen DISCONTD: metFORMIN (GLUCOPHAGE) 850 MG tablet Take 850 mg by mouth 2 (two) times daily with a meal.        . DISCONTD: Olopatadine HCl (PATANASE) 0.6 % SOLN Place 2 sprays into the nose 2 (two) times daily.       Marland Kitchen DISCONTD: Pseudoeph-Chlorphen-Hydrocod (ZUTRIPRO) 60-4-5 MG/5ML SOLN

## 2011-04-09 ENCOUNTER — Telehealth: Payer: Self-pay | Admitting: Critical Care Medicine

## 2011-04-09 NOTE — Telephone Encounter (Signed)
Spoke with the patient and she wanted to know if the meds she was given yesterday at appt could make her sleepy. I advised that it is possible that some of them could make her sleepy. She states understanding and no further questions. P[place] Bing, CMA

## 2011-04-09 NOTE — Assessment & Plan Note (Addendum)
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  No true lower lung disease Note CXR no active disease,  Note no real sinus disease Note spirometry is normal  Plan Stop omeprazole Start Dexilant one daily Start Reglan one 4 times a day with meals and at bedtime Stop Qvar Use cyclic cough protocol Reflux diet

## 2011-04-15 ENCOUNTER — Telehealth: Payer: Self-pay | Admitting: Critical Care Medicine

## 2011-04-15 NOTE — Telephone Encounter (Signed)
(  Very hard to communicate with pt due to language barrier) From what I was able to understand pt was on cough protocol and completed Tussionex and started Benzonatate yesterday. c/o Muscle pains in right arm, back and foot pain. Offered pt appointment and she declined and states she "does not want to be bombarded with bills" and that her "Medicaid has expired". Pt is scheduled to be seen by TP 04/17/11 and wants to cancel same because she does not know what the cost is and will go to ER or UC. Gave pt ProFee #. Will forward to PW for FYI.

## 2011-04-15 NOTE — Telephone Encounter (Signed)
Not much to offer or add if she is unwilling to come to the office for eval.

## 2011-04-16 ENCOUNTER — Telehealth: Payer: Self-pay | Admitting: Critical Care Medicine

## 2011-04-16 NOTE — Telephone Encounter (Signed)
I called the [employer] [place] [employer] Dept at [phone] to initiate PA for pt's Dexilant and was told that her coverage ended on 04/11/2011. The pt is aware of this and will be checking into getting insurance coverage soon but cannot afford her medications in the meantime. She currently gets prescriptions for Dexilant and Ventolin HFA through our office. I will forward this msg to our PCC's to see if there is patient assistance on either of the medications and call the pt back.

## 2011-04-17 ENCOUNTER — Telehealth: Payer: Self-pay | Admitting: Critical Care Medicine

## 2011-04-17 ENCOUNTER — Ambulatory Visit: Payer: Medicaid Other | Admitting: Adult Health

## 2011-04-17 MED ORDER — TRAMADOL HCL ER 100 MG PO TB24: ORAL_TABLET | ORAL | Status: DC

## 2011-04-17 NOTE — Telephone Encounter (Signed)
Spoke to pt and mailed pt assistance for ventolin hfa and dexilant

## 2011-04-17 NOTE — Telephone Encounter (Signed)
Pt is aware of new rx for tramadol and directions for use. New rx sent to her pharmacy.

## 2011-04-17 NOTE — Telephone Encounter (Signed)
Try generic tramadol 100mg  3 times daily for 3 days then 3 times daily as needed thereafter for cough  #90 1 RF

## 2011-04-17 NOTE — Telephone Encounter (Signed)
Spoke with pt. She states was prescribed tussicaps at last ov and was unable to afford rx and is requesting alternative med. PW, pls advise thanks!

## 2011-04-21 ENCOUNTER — Emergency Department (HOSPITAL_COMMUNITY)
Admission: EM | Admit: 2011-04-21 | Discharge: 2011-04-21 | Disposition: A | Discharge status: Home / Self Care | Payer: Self-pay | Attending: Emergency Medicine | Admitting: Emergency Medicine

## 2011-04-21 ENCOUNTER — Emergency Department (HOSPITAL_COMMUNITY): Payer: Self-pay

## 2011-04-21 DIAGNOSIS — K219 Gastro-esophageal reflux disease without esophagitis: Secondary | ICD-10-CM | POA: Insufficient documentation

## 2011-04-21 DIAGNOSIS — I10 Essential (primary) hypertension: Secondary | ICD-10-CM | POA: Insufficient documentation

## 2011-04-21 DIAGNOSIS — R05 Cough: Secondary | ICD-10-CM | POA: Insufficient documentation

## 2011-04-21 DIAGNOSIS — R079 Chest pain, unspecified: Secondary | ICD-10-CM | POA: Insufficient documentation

## 2011-04-21 DIAGNOSIS — E876 Hypokalemia: Secondary | ICD-10-CM | POA: Insufficient documentation

## 2011-04-21 DIAGNOSIS — E119 Type 2 diabetes mellitus without complications: Secondary | ICD-10-CM | POA: Insufficient documentation

## 2011-04-21 DIAGNOSIS — Z79899 Other long term (current) drug therapy: Secondary | ICD-10-CM | POA: Insufficient documentation

## 2011-04-21 DIAGNOSIS — R059 Cough, unspecified: Secondary | ICD-10-CM | POA: Insufficient documentation

## 2011-04-21 DIAGNOSIS — E7800 Pure hypercholesterolemia, unspecified: Secondary | ICD-10-CM | POA: Insufficient documentation

## 2011-04-21 LAB — CBC
HCT: 37.7 % (ref 36.0–46.0)
Hemoglobin: 13.6 g/dL (ref 12.0–15.0)
MCH: 29 pg (ref 26.0–34.0)
MCHC: 36.1 g/dL — ABNORMAL HIGH (ref 30.0–36.0)
MCV: 80.4 fL (ref 78.0–100.0)
Platelets: 221 10*3/uL (ref 150–400)
RBC: 4.69 MIL/uL (ref 3.87–5.11)
RDW: 12.7 % (ref 11.5–15.5)
WBC: 6.2 10*3/uL (ref 4.0–10.5)

## 2011-04-21 LAB — URINALYSIS, ROUTINE W REFLEX MICROSCOPIC
Bilirubin Urine: NEGATIVE
Glucose, UA: NEGATIVE mg/dL
Hgb urine dipstick: NEGATIVE
Ketones, ur: NEGATIVE mg/dL
Leukocytes, UA: NEGATIVE
Nitrite: NEGATIVE
Protein, ur: 100 mg/dL — AB
Specific Gravity, Urine: 1.012 (ref 1.005–1.030)
Urobilinogen, UA: 0.2 mg/dL (ref 0.0–1.0)
pH: 7 (ref 5.0–8.0)

## 2011-04-21 LAB — BASIC METABOLIC PANEL
BUN: 17 mg/dL (ref 6–23)
CO2: 30 mEq/L (ref 19–32)
Calcium: 10.2 mg/dL (ref 8.4–10.5)
Chloride: 97 mEq/L (ref 96–112)
Creatinine, Ser: 1.33 mg/dL — ABNORMAL HIGH (ref 0.50–1.10)
GFR calc Af Amer: 49 mL/min — ABNORMAL LOW (ref >60)
GFR calc non Af Amer: 40 mL/min — ABNORMAL LOW (ref >60)
Glucose, Bld: 114 mg/dL — ABNORMAL HIGH (ref 70–99)
Potassium: 2.9 mEq/L — ABNORMAL LOW (ref 3.5–5.1)
Sodium: 140 mEq/L (ref 135–145)

## 2011-04-21 LAB — LIPASE, BLOOD: Lipase: 65 U/L — ABNORMAL HIGH (ref 11–59)

## 2011-04-21 LAB — POCT I-STAT TROPONIN I: Troponin i, poc: 0.01 ng/mL (ref 0.00–0.08)

## 2011-04-21 LAB — DIFFERENTIAL
Basophils Absolute: 0 10*3/uL (ref 0.0–0.1)
Basophils Relative: 1 % (ref 0–1)
Eosinophils Absolute: 0.3 10*3/uL (ref 0.0–0.7)
Eosinophils Relative: 5 % (ref 0–5)
Lymphocytes Relative: 28 % (ref 12–46)
Lymphs Abs: 1.8 10*3/uL (ref 0.7–4.0)
Monocytes Absolute: 0.7 10*3/uL (ref 0.1–1.0)
Monocytes Relative: 11 % (ref 3–12)
Neutro Abs: 3.5 10*3/uL (ref 1.7–7.7)
Neutrophils Relative %: 56 % (ref 43–77)

## 2011-04-21 LAB — URINE MICROSCOPIC-ADD ON

## 2011-04-24 ENCOUNTER — Encounter: Payer: Self-pay | Admitting: Adult Health

## 2011-04-24 ENCOUNTER — Ambulatory Visit (INDEPENDENT_AMBULATORY_CARE_PROVIDER_SITE_OTHER): Payer: Medicaid Other | Admitting: Adult Health

## 2011-04-24 ENCOUNTER — Inpatient Hospital Stay: Payer: Medicaid Other | Admitting: Critical Care Medicine

## 2011-04-24 DIAGNOSIS — R05 Cough: Secondary | ICD-10-CM

## 2011-04-24 DIAGNOSIS — R059 Cough, unspecified: Secondary | ICD-10-CM

## 2011-04-24 MED ORDER — TRAMADOL HCL 50 MG PO TABS: 50.0000 mg | ORAL_TABLET | Freq: Four times a day (QID) | ORAL | Status: AC | PRN

## 2011-04-24 NOTE — Progress Notes (Signed)
Subjective:    Patient ID: Paula [name], female    DOB: 1946-01-03, 65 y.o.   MRN: [id number] 65 y.o. AAF with cyclical cough d/t GERD, upper airway instability HPI Comments: Cough since 1993.  Coughing seasonally , but now coughing constantly, phlegm into nose and mouth. Saw ENT and was neg w/u  Cough This is a chronic problem. The current episode started more than 1 year ago. The problem has been gradually worsening. The problem occurs constantly. The cough is productive of sputum (mucus is clear , no blood). Associated symptoms include ear congestion, ear pain, headaches, heartburn, nasal congestion, postnasal drip, rhinorrhea, a sore throat, shortness of breath and wheezing. Pertinent negatives include no chest pain, chills, fever, hemoptysis, myalgias or rash. Associated symptoms comments: Has a dry scratchy throat If eats food will regurgitate . The symptoms are aggravated by stress, dust, fumes, pollens, lying down and exercise (allergy to mold and dust, coughs all night long). She has tried steroid inhaler and a beta-agonist inhaler for the symptoms. The treatment provided no relief. Her past medical history is significant for environmental allergies. There is no history of asthma, bronchitis, COPD, emphysema or pneumonia.   04/24/2011 ER follow up  Pt returns for follow up from ER visit. On 04/21/11. Seen in ER for chest pain, dyspnea and weakness on 9/10. Told she had severe reflux. Was unable to take reglan given last ov due to side effects. She   CXR in ER with no acute findings. Since ER she is feeling better., taking dexilant which is helping.  Of note with med review , she is on verapamil and norvasc  Per PCP .  Does not have money , medicaid does not cover certain meds or otc meds.  NO discolored mucus or fever. Some throat drainage at times.    Past Medical History  Diagnosis Date  . Allergic rhinitis   . Cough   . GERD (gastroesophageal reflux disease)   . Diabetes  mellitus   . Hypertension   . Hyperlipidemia      Family History  Problem Relation Age of Onset  . Asthma Sister      History   Social History  . Marital Status: Widowed    Spouse Name: N/A    Number of Children: N/A  . Years of Education: N/A   Occupational History  . Not on file.   Social History Main Topics  . Smoking status: Never Smoker   . Smokeless tobacco: Never Used  . Alcohol Use: No  . Drug Use: No  . Sexually Active: Not on file   Other Topics Concern  . Not on file   Social History Narrative  . No narrative on file     Allergies  Allergen Reactions  . Ace Inhibitors     REACTION: chronic cough     Outpatient Prescriptions Prior to Visit  Medication Sig Dispense Refill  . albuterol (VENTOLIN HFA) 108 (90 BASE) MCG/ACT inhaler Inhale 2 puffs into the lungs every 6 (six) hours as needed.        Marland Kitchen amLODipine (NORVASC) 5 MG tablet Take 5 mg by mouth daily.        Marland Kitchen aspirin 81 MG tablet Take 81 mg by mouth daily.        . verapamil (CALAN) 80 MG tablet Take 80 mg by mouth 2 (two) times daily.       . beclomethasone (QVAR) 80 MCG/ACT inhaler Inhale 2 puffs into the lungs 2 (two) times  daily.        . Calcium-Vitamin D (CALTRATE 600 PLUS-VIT D PO)        . hydrochlorothiazide (,MICROZIDE/HYDRODIURIL,) 12.5 MG capsule Take 12.5 mg by mouth daily.        Marland Kitchen loratadine (CLARITIN) 10 MG tablet Take 10 mg by mouth daily.        . metFORMIN (GLUCOPHAGE) 850 MG tablet Take 850 mg by mouth 2 (two) times daily with a meal.        . Olopatadine HCl (PATANASE) 0.6 % SOLN Place 2 sprays into the nose 2 (two) times daily.       . Pseudoeph-Chlorphen-Hydrocod (ZUTRIPRO) 60-4-5 MG/5ML SOLN             Review of Systems  Constitutional:   No  weight loss, night sweats,  Fevers, chills, fatigue, or  lassitude.  HEENT:   No headaches,  Difficulty swallowing,  Tooth/dental problems, or  Sore throat,                No sneezing, itching, ear ache, nasal congestion, post  nasal drip,   CV:  No chest pain,  Orthopnea, PND, swelling in lower extremities, anasarca, dizziness, palpitations, syncope.   GI    abdominal pain, nausea, vomiting, diarrhea, change in bowel habits, loss of appetite, bloody stools.   Resp:   No coughing up of blood.   No chest wall deformity  Skin: no rash or lesions.  GU: no dysuria, change in color of urine, no urgency or frequency.  No flank pain, no hematuria   MS:  No joint pain or swelling.  No decreased range of motion.  No back pain.  Psych:  No change in mood or affect. No depression or anxiety.  No memory loss.          Objective:   Physical Exam .                              Gen: Pleasant, well-nourished, in no distress,  normal affect  ENT: No lesions,  mouth clear,  oropharynx clear, no postnasal drip  Neck: No JVD, no TMG, no carotid bruits  Lungs: No use of accessory muscles, no dullness to percussion,prominent pseudowheeze  Cardiovascular: RRR, heart sounds normal, no murmur or gallops, no peripheral edema  Abdomen: soft and NT, no HSM,  BS normal  Musculoskeletal: No deformities, no cyanosis or clubbing  Neuro: alert, non focal  Skin: Warm, no lesions or rashes CXR 04/21/11  NAD Arlyce Harman 04/08/11: normal CT sinus 6/12: normal    Assessment & Plan:      Updated Medication List Outpatient Encounter Prescriptions as of 04/08/2011  Medication Sig Dispense Refill  . acetaminophen (TYLENOL) 500 MG tablet Take 500 mg by mouth as needed.        Marland Kitchen albuterol (VENTOLIN HFA) 108 (90 BASE) MCG/ACT inhaler Inhale 2 puffs into the lungs every 6 (six) hours as needed.        Marland Kitchen amLODipine (NORVASC) 5 MG tablet Take 5 mg by mouth daily.        Marland Kitchen aspirin 81 MG tablet Take 81 mg by mouth daily.        . Calcium Carbonate-Vitamin D (CALCIUM 600+D) 600-200 MG-UNIT TABS Take 1 tablet by mouth daily.        . cetirizine (ZYRTEC) 10 MG tablet Take 10 mg by mouth daily.        . hydrochlorothiazide 25  MG  tablet Take 25 mg by mouth daily.        . metFORMIN (GLUCOPHAGE) 500 MG tablet Take 500 mg by mouth 2 (two) times daily.        Vladimir Faster Glycol-Propyl Glycol 0.4-0.3 % SOLN Apply 1 drop to eye 3 (three) times daily.        . verapamil (CALAN) 80 MG tablet Take 80 mg by mouth 2 (two) times daily.       Marland Kitchen DISCONTD: beclomethasone (QVAR) 80 MCG/ACT inhaler Inhale 2 puffs into the lungs 2 (two) times daily.        Marland Kitchen DISCONTD: Dextromethorphan Polistirex (DELSYM PO) Take by mouth. 1 lozenge as needed       . DISCONTD: omeprazole (PRILOSEC) 20 MG capsule Take 20 mg by mouth daily.        . benzonatate (TESSALON) 100 MG capsule Take 1-2 4 times daily with cyclic cough protocol  90 capsule  4  . dexlansoprazole (DEXILANT) 60 MG capsule Take 1 capsule (60 mg total) by mouth daily.  30 capsule  6  . Hydrocod Polst-Chlorphen Polst (TUSSICAPS) 10-8 MG CP12 Take 1 capsule by mouth 2 (two) times daily as needed.  10 each  1  . metoCLOPramide (REGLAN) 10 MG tablet One three times a day at meals and at bedtime  40 tablet  1  . DISCONTD: Calcium-Vitamin D (CALTRATE 600 PLUS-VIT D PO)        . DISCONTD: hydrochlorothiazide (,MICROZIDE/HYDRODIURIL,) 12.5 MG capsule Take 12.5 mg by mouth daily.        Marland Kitchen DISCONTD: loratadine (CLARITIN) 10 MG tablet Take 10 mg by mouth daily.        Marland Kitchen DISCONTD: metFORMIN (GLUCOPHAGE) 850 MG tablet Take 850 mg by mouth 2 (two) times daily with a meal.        . DISCONTD: Olopatadine HCl (PATANASE) 0.6 % SOLN Place 2 sprays into the nose 2 (two) times daily.       Marland Kitchen DISCONTD: Pseudoeph-Chlorphen-Hydrocod (ZUTRIPRO) 60-4-5 MG/5ML SOLN

## 2011-04-24 NOTE — Patient Instructions (Addendum)
Follow up Dr. Melvyn Novas  In 2-3 weeks .  GERD diet  Dexilant 60mg  daily -before meal  Avoid all mint products  Use sugarless candy, ice chips, water to help coughing and throat clearing.  Stop reglan .  Delsym 2 tsp every 12 hrs for cough (for cough ) otc  Tramadol 50mg  every 4 hr As needed  Breakthrough cough  Chlor -Trimeton 4mg  2 At bedtime  (for drainage ) otc  Pepcid 20mg  At bedtime  (for reflux) otc

## 2011-04-28 NOTE — Assessment & Plan Note (Signed)
Flare with upper airway inflammation ? Secondary to GERD CXR with no acute changes  Plan;  Follow up Dr. Melvyn Novas  In 2-3 weeks .  GERD diet  Dexilant 60mg  daily -before meal  Avoid all mint products  Use sugarless candy, ice chips, water to help coughing and throat clearing.  Stop reglan .  Delsym 2 tsp every 12 hrs for cough (for cough ) otc  Tramadol 50mg  every 4 hr As needed  Breakthrough cough  Chlor -Trimeton 4mg  2 At bedtime  (for drainage ) otc  Pepcid 20mg  At bedtime  (for reflux) otc

## 2011-05-06 ENCOUNTER — Ambulatory Visit (INDEPENDENT_AMBULATORY_CARE_PROVIDER_SITE_OTHER): Payer: Self-pay | Admitting: Critical Care Medicine

## 2011-05-06 ENCOUNTER — Encounter: Payer: Self-pay | Admitting: Critical Care Medicine

## 2011-05-06 DIAGNOSIS — R059 Cough, unspecified: Secondary | ICD-10-CM

## 2011-05-06 DIAGNOSIS — R05 Cough: Secondary | ICD-10-CM

## 2011-05-06 NOTE — Progress Notes (Signed)
Subjective:    Patient ID: Paula [name], female    DOB: Nov 14, 1945, 65 y.o.   MRN: [id number]  HPI  HPI Comments: Cough since 1993.  Coughing seasonally , but now coughing constantly, phlegm into nose and mouth. Saw ENT and was neg w/u  Cough This is a chronic problem. The current episode started more than 1 year ago. The problem has been gradually worsening. The problem occurs constantly. The cough is productive of sputum (mucus is clear , no blood). Associated symptoms include ear congestion, ear pain, headaches, heartburn, nasal congestion, postnasal drip, rhinorrhea, a sore throat, shortness of breath and wheezing. Pertinent negatives include no chest pain, chills, fever, hemoptysis, myalgias or rash. Associated symptoms comments: Has a dry scratchy throat If eats food will regurgitate . The symptoms are aggravated by stress, dust, fumes, pollens, lying down and exercise (allergy to mold and dust, coughs all night long). She has tried steroid inhaler and a beta-agonist inhaler for the symptoms. The treatment provided no relief. Her past medical history is significant for environmental allergies. There is no history of asthma, bronchitis, COPD, emphysema or pneumonia.   04/24/2011 ER follow up  Pt returns for follow up from ER visit. On 04/21/11. Seen in ER for chest pain, dyspnea and weakness on 9/10. Told she had severe reflux. Was unable to take reglan given last ov due to side effects. She   CXR in ER with no acute findings. Since ER she is feeling better., taking dexilant which is helping.  Of note with med review , she is on verapamil and norvasc  Per PCP .  Does not have money , medicaid does not cover certain meds or otc meds.  NO discolored mucus or fever. Some throat drainage at times.   05/06/2011 Pt first seen end 8/12 and we rec: Stop omeprazole  Start Dexilant one daily  Start Reglan one 4 times a day with meals and at bedtime  Stop Qvar  Use cyclic cough protocol  At  Ov with NP 9/13 we rec: GERD diet  Dexilant 60mg  daily -before meal  Avoid all mint products  Use sugarless candy, ice chips, water to help coughing and throat clearing.  Stop reglan .  Delsym 2 tsp every 12 hrs for cough (for cough ) otc  Tramadol 50mg  every 4 hr As needed Breakthrough cough  Chlor -Trimeton 4mg  2 At bedtime (for drainage ) otc  Pepcid 20mg  At bedtime (for reflux) otc   Not as bad as before.   No real heartburn.  Chest will squeeze.   Cough remains dry occ some phlegm.  Not dyspneic.    Review of Systems Constitutional:   No  weight loss, night sweats,  Fevers, chills, fatigue, lassitude. HEENT:   No headaches,  Difficulty swallowing,  Tooth/dental problems,  Sore throat,                No sneezing, itching, ear ache, nasal congestion, post nasal drip,   CV:  No chest pain,  Orthopnea, PND, swelling in lower extremities, anasarca, dizziness, palpitations  GI  No heartburn, indigestion, abdominal pain, nausea, vomiting, diarrhea, change in bowel habits, loss of appetite  Resp: No shortness of breath with exertion or at rest.  No excess mucus, no productive cough,  No non-productive cough,  No coughing up of blood.  No change in color of mucus.  No wheezing.  No chest wall deformity  Skin: no rash or lesions.  GU: no dysuria, change in color of  urine, no urgency or frequency.  No flank pain.  MS:  No joint pain or swelling.  No decreased range of motion.  No back pain.  Psych:  No change in mood or affect. No depression or anxiety.  No memory loss.     Objective:   Physical Exam Filed Vitals:   05/06/11 1057  BP: 152/82  Pulse: 96  Temp: 98.7 F (37.1 C)  TempSrc: Oral  Height: 5\' 4"  (1.626 m)  Weight: 159 lb 9.6 oz (72.394 kg)  SpO2: 99%    Gen: Pleasant, well-nourished, in no distress,  normal affect  ENT: No lesions,  mouth clear,  oropharynx clear, no postnasal drip  Neck: No JVD, no TMG, no carotid bruits  Lungs: No use of accessory  muscles, no dullness to percussion, clear without rales or rhonchi  Cardiovascular: RRR, heart sounds normal, no murmur or gallops, no peripheral edema  Abdomen: soft and NT, no HSM,  BS normal  Musculoskeletal: No deformities, no cyanosis or clubbing  Neuro: alert, non focal  Skin: Warm, no lesions or rashes    CXR 04/21/11  NAD Arlyce Harman 04/08/11: normal CT sinus 6/12: normal     Assessment & Plan:   COUGH, 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 acetaminophen (TYLENOL) 500 MG tablet Take 500 mg by mouth as needed.        Marland Kitchen albuterol (VENTOLIN HFA) 108 (90 BASE) MCG/ACT inhaler Inhale 2 puffs into the lungs every 6 (six) hours as needed.        Marland Kitchen amLODipine (NORVASC) 5 MG tablet Take 5 mg by mouth daily.        Marland Kitchen aspirin 81 MG tablet Take 81 mg by mouth daily.        . Calcium Carbonate-Vitamin D (CALCIUM 600+D) 600-200 MG-UNIT TABS Take 1 tablet by mouth daily.        . cetirizine (ZYRTEC) 10 MG tablet Take 10 mg by mouth daily.        . chlorpheniramine (CHLOR-TRIMETON) 4 MG tablet Take 4 mg by mouth at bedtime.        Marland Kitchen dexlansoprazole (DEXILANT) 60 MG capsule Take 1 capsule (60 mg total) by mouth daily.  30 capsule  6  . famotidine (PEPCID) 10 MG tablet Take 20 mg by mouth at bedtime.        . hydrochlorothiazide 25 MG tablet Take 25 mg by mouth daily.        . metFORMIN (GLUCOPHAGE) 500 MG tablet Take 500 mg by mouth 2 (two) times daily.        Vladimir Faster Glycol-Propyl Glycol 0.4-0.3 % SOLN Apply 1 drop to eye 3 (three) times daily.        Marland Kitchen  POTASSIUM PO Take by mouth daily. Unsure os strength       . verapamil (CALAN) 80 MG tablet Take 80 mg by mouth 2 (two) times daily.       Marland Kitchen DISCONTD: metoCLOPramide (REGLAN) 10 MG tablet One three times a day at meals and at bedtime  40 tablet  1

## 2011-05-06 NOTE — Patient Instructions (Addendum)
Stop pepcid after your current supply runs out Stay on dexilant daily Use Delsym as needed Use chlorpheniramine at bedtime Keep sugar free jolly ranchers in mouth to train yourself to swallow not cough or clear throat Follow reflux diet  Return 2 months

## 2011-05-07 NOTE — Assessment & Plan Note (Signed)
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

## 2011-05-13 LAB — GLUCOSE, CAPILLARY
Glucose-Capillary: 107 — ABNORMAL HIGH
Glucose-Capillary: 109 — ABNORMAL HIGH
Glucose-Capillary: 113 — ABNORMAL HIGH
Glucose-Capillary: 114 — ABNORMAL HIGH
Glucose-Capillary: 123 — ABNORMAL HIGH
Glucose-Capillary: 204 — ABNORMAL HIGH
Glucose-Capillary: 230 — ABNORMAL HIGH
Glucose-Capillary: 276 — ABNORMAL HIGH
Glucose-Capillary: 54 — ABNORMAL LOW
Glucose-Capillary: 93
Glucose-Capillary: 95

## 2011-05-13 LAB — COMPREHENSIVE METABOLIC PANEL
ALT: 16
AST: 20
Albumin: 3.5
Alkaline Phosphatase: 51
BUN: 24 — ABNORMAL HIGH
CO2: 27
Calcium: 9.6
Chloride: 104
Creatinine, Ser: 1.25 — ABNORMAL HIGH
GFR calc Af Amer: 53 — ABNORMAL LOW
GFR calc non Af Amer: 44 — ABNORMAL LOW
Glucose, Bld: 132 — ABNORMAL HIGH
Potassium: 3.8
Sodium: 141
Total Bilirubin: 0.7
Total Protein: 6.5

## 2011-05-13 LAB — BASIC METABOLIC PANEL
BUN: 18
BUN: 22
CO2: 23
CO2: 24
Calcium: 9.3
Calcium: 9.3
Chloride: 106
Chloride: 111
Creatinine, Ser: 1.12
Creatinine, Ser: 1.14
GFR calc Af Amer: 59 — ABNORMAL LOW
GFR calc Af Amer: 60 — ABNORMAL LOW
GFR calc non Af Amer: 48 — ABNORMAL LOW
GFR calc non Af Amer: 49 — ABNORMAL LOW
Glucose, Bld: 104 — ABNORMAL HIGH
Glucose, Bld: 144 — ABNORMAL HIGH
Potassium: 3.6
Potassium: 4.1
Sodium: 139
Sodium: 142

## 2011-05-13 LAB — HEMOGLOBIN A1C
Hgb A1c MFr Bld: 6.5 — ABNORMAL HIGH
Mean Plasma Glucose: 140

## 2011-05-13 LAB — CBC
HCT: 39.9
Hemoglobin: 13.3
MCHC: 33.4
MCV: 87.8
Platelets: 185
RBC: 4.54
RDW: 13.5
WBC: 5.1

## 2011-05-13 LAB — CARDIAC PANEL(CRET KIN+CKTOT+MB+TROPI)
CK, MB: 0.9
CK, MB: 1.1
Relative Index: 1
Relative Index: INVALID
Total CK: 115
Total CK: 94
Troponin I: <0.01
Troponin I: <0.01

## 2011-05-13 LAB — LIPID PANEL
Cholesterol: 183
HDL: 33 — ABNORMAL LOW
LDL Cholesterol: 124 — ABNORMAL HIGH
Total CHOL/HDL Ratio: 5.5
Triglycerides: 132
VLDL: 26

## 2011-05-13 LAB — D-DIMER, QUANTITATIVE: D-Dimer, Quant: 0.24

## 2011-05-13 LAB — POCT CARDIAC MARKERS
CKMB, poc: <1 — ABNORMAL LOW
Myoglobin, poc: 55.9
Troponin i, poc: <0.05

## 2011-05-13 LAB — B-NATRIURETIC PEPTIDE (CONVERTED LAB): Pro B Natriuretic peptide (BNP): <30

## 2011-05-13 LAB — TSH: TSH: 2.789

## 2011-05-19 ENCOUNTER — Ambulatory Visit: Payer: Medicaid Other | Admitting: Internal Medicine

## 2011-06-10 ENCOUNTER — Telehealth: Payer: Self-pay | Admitting: Critical Care Medicine

## 2011-06-10 NOTE — Telephone Encounter (Signed)
Sample at front. Unable to notify pt, phone  Unable to accept calls. [place], CMA

## 2011-06-11 ENCOUNTER — Telehealth: Payer: Self-pay | Admitting: Critical Care Medicine

## 2011-06-11 NOTE — Telephone Encounter (Signed)
I spoke with pt and she is aware dexilant samples where upfront for pick up. Pt verbalized understanding and had no questions

## 2011-06-11 NOTE — Telephone Encounter (Signed)
Pt adds that she wants to know what the status is re: her rx for this- says she signed something re: rx's (maybe med assistance)? Hasn't heard back re: this. Mariann Laster

## 2011-06-11 NOTE — Telephone Encounter (Signed)
ATC NA unable to leave VM wcb

## 2011-06-12 ENCOUNTER — Telehealth: Payer: Self-pay | Admitting: Critical Care Medicine

## 2011-06-12 NOTE — Telephone Encounter (Signed)
ATC x 1. Line busy. WCB. 

## 2011-06-12 NOTE — Telephone Encounter (Signed)
See other note dated 06/11/11.

## 2011-06-13 NOTE — Telephone Encounter (Signed)
ATC and line was still busy, WCB.

## 2011-06-16 NOTE — Telephone Encounter (Signed)
Spoke with [employer] and she states will work on this for pt, has more assistance forms and wants the msg forwarded to her. Thanks [employer]!

## 2011-06-16 NOTE — Telephone Encounter (Signed)
No, I do not have any paperwork or pt assistance forms on this pt.  I haven't seen any either.  Who did she give them too?

## 2011-06-16 NOTE — Telephone Encounter (Signed)
[employer] said they never got forms refilled out forms amd pt will come by 06/17/11 to resign

## 2011-06-16 NOTE — Telephone Encounter (Signed)
Crystal, this pt states she dropped off papers and I think she is talking about patient assistance forms. They mailed in their income information and [employer] has this, but she does not have any assistant forms. Do you have these? Please advise. [place], CMA

## 2011-06-16 NOTE — Telephone Encounter (Signed)
Spoke with pt and notified that [place] does not have the forms and she states does not remember who she gave the forms to. She states that this has been about a month ago.

## 2011-06-24 ENCOUNTER — Telehealth: Payer: Self-pay | Admitting: Critical Care Medicine

## 2011-06-24 NOTE — Telephone Encounter (Signed)
Dexilant should be 60 mg daily- Called back and spoke with Jenny Reichmann and notified of this and he verbalized understanding.

## 2011-07-07 ENCOUNTER — Other Ambulatory Visit: Payer: Self-pay | Admitting: Obstetrics and Gynecology

## 2011-07-07 ENCOUNTER — Ambulatory Visit (INDEPENDENT_AMBULATORY_CARE_PROVIDER_SITE_OTHER): Payer: Self-pay | Admitting: Critical Care Medicine

## 2011-07-07 ENCOUNTER — Encounter: Payer: Self-pay | Admitting: Critical Care Medicine

## 2011-07-07 ENCOUNTER — Other Ambulatory Visit: Payer: Self-pay | Admitting: Anesthesiology

## 2011-07-07 DIAGNOSIS — R05 Cough: Secondary | ICD-10-CM

## 2011-07-07 DIAGNOSIS — Z1231 Encounter for screening mammogram for malignant neoplasm of breast: Secondary | ICD-10-CM

## 2011-07-07 DIAGNOSIS — R059 Cough, unspecified: Secondary | ICD-10-CM

## 2011-07-07 MED ORDER — DEXLANSOPRAZOLE 60 MG PO CPDR: 60.0000 mg | DELAYED_RELEASE_CAPSULE | Freq: Every day | ORAL | Status: DC

## 2011-07-07 NOTE — Progress Notes (Signed)
Subjective:    Patient ID: Paula [name], female    DOB: 1946-03-29, 65 y.o.   MRN: [id number]  Cough    HPI Comments: Cough since 1993.  Coughing seasonally , but now coughing constantly, phlegm into nose and mouth. Saw ENT and was neg w/u  Cough This is a chronic problem. The current episode started more than 1 year ago. The problem has been gradually worsening. The problem occurs constantly. The cough is productive of sputum (mucus is clear , no blood). Associated symptoms include ear congestion, ear pain, headaches, heartburn, nasal congestion, postnasal drip, rhinorrhea, a sore throat, shortness of breath and wheezing. Pertinent negatives include no chest pain, chills, fever, hemoptysis, myalgias or rash. Associated symptoms comments: Has a dry scratchy throat If eats food will regurgitate . The symptoms are aggravated by stress, dust, fumes, pollens, lying down and exercise (allergy to mold and dust, coughs all night long). She has tried steroid inhaler and a beta-agonist inhaler for the symptoms. The treatment provided no relief. Her past medical history is significant for environmental allergies. There is no history of asthma, bronchitis, COPD, emphysema or pneumonia.   04/24/2011 ER follow up  Pt returns for follow up from ER visit. On 04/21/11. Seen in ER for chest pain, dyspnea and weakness on 9/10. Told she had severe reflux. Was unable to take reglan given last ov due to side effects. She   CXR in ER with no acute findings. Since ER she is feeling better., taking dexilant which is helping.  Of note with med review , she is on verapamil and norvasc  Per PCP .  Does not have money , medicaid does not cover certain meds or otc meds.  NO discolored mucus or fever. Some throat drainage at times.   9/25 Pt first seen end 8/12 and we rec: Stop omeprazole  Start Dexilant one daily  Start Reglan one 4 times a day with meals and at bedtime  Stop Qvar  Use cyclic cough  protocol  At Ov with NP 9/13 we rec: GERD diet  Dexilant 60mg  daily -before meal  Avoid all mint products  Use sugarless candy, ice chips, water to help coughing and throat clearing.  Stop reglan .  Delsym 2 tsp every 12 hrs for cough (for cough ) otc  Tramadol 50mg  every 4 hr As needed Breakthrough cough  Chlor -Trimeton 4mg  2 At bedtime (for drainage ) otc  Pepcid 20mg  At bedtime (for reflux) otc   Not as bad as before.   No real heartburn.  Chest will squeeze.   Cough remains dry occ some phlegm.  Not dyspneic.   07/07/2011 At last ov we rec: Stop pepcid after your current supply runs out Stay on dexilant daily Use Delsym as needed Use chlorpheniramine at bedtime Keep sugar free jolly ranchers in mouth to train yourself to swallow not cough or clear throat  Out of dexilant.  Notices a difference, food refluxes less.  Cough is less. Not as frequent.    Review of Systems  Respiratory: Positive for cough.    Constitutional:   No  weight loss, night sweats,  Fevers, chills, fatigue, lassitude. HEENT:   No headaches,  Difficulty swallowing,  Tooth/dental problems,  Sore throat,                No sneezing, itching, ear ache, nasal congestion, post nasal drip,   CV:  No chest pain,  Orthopnea, PND, swelling in lower extremities, anasarca, dizziness, palpitations  GI  No heartburn, indigestion, abdominal pain, nausea, vomiting, diarrhea, change in bowel habits, loss of appetite  Resp: No shortness of breath with exertion or at rest.  No excess mucus, no productive cough,  No non-productive cough,  No coughing up of blood.  No change in color of mucus.  No wheezing.  No chest wall deformity  Skin: no rash or lesions.  GU: no dysuria, change in color of urine, no urgency or frequency.  No flank pain.  MS:  No joint pain or swelling.  No decreased range of motion.  No back pain.  Psych:  No change in mood or affect. No depression or anxiety.  No memory loss.     Objective:    Physical Exam  Filed Vitals:   07/07/11 1331  BP: 130/72  Pulse: 83  Height: 5\' 3"  (1.6 m)  Weight: 156 lb 12.8 oz (71.124 kg)  SpO2: 98%    Gen: Pleasant, well-nourished, in no distress,  normal affect  ENT: No lesions,  mouth clear,  oropharynx clear, no postnasal drip  Neck: No JVD, no TMG, no carotid bruits  Lungs: No use of accessory muscles, no dullness to percussion, clear without rales or rhonchi  Cardiovascular: RRR, heart sounds normal, no murmur or gallops, no peripheral edema  Abdomen: soft and NT, no HSM,  BS normal  Musculoskeletal: No deformities, no cyanosis or clubbing  Neuro: alert, non focal  Skin: Warm, no lesions or rashes    CXR 04/21/11  NAD Arlyce Harman 04/08/11: normal CT sinus 6/12: normal     Assessment & Plan:   COUGH, 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 acetaminophen (TYLENOL) 500 MG tablet Take 500 mg by mouth as needed.        Marland Kitchen amLODipine (NORVASC) 5 MG tablet Take 5 mg by mouth daily.        Marland Kitchen aspirin 81 MG tablet Take 81 mg by mouth daily.        . Calcium Carbonate-Vitamin D (CALCIUM 600+D) 600-200 MG-UNIT TABS Take 1 tablet by mouth daily.        . cetirizine (ZYRTEC) 10 MG tablet Take 10 mg by mouth daily.        . chlorpheniramine (CHLOR-TRIMETON) 4 MG tablet Take 4 mg by mouth at bedtime.        . hydrochlorothiazide 25 MG tablet Take 25 mg by mouth daily.        . metFORMIN (GLUCOPHAGE) 500 MG tablet Take 500 mg by mouth 2 (two) times daily.        Vladimir Faster Glycol-Propyl Glycol 0.4-0.3 % SOLN  Apply 1 drop to eye 3 (three) times daily.        Marland Kitchen POTASSIUM PO Take by mouth daily. Unsure os strength       . verapamil (CALAN) 80 MG tablet Take 80 mg by mouth 2 (two) times daily.       Marland Kitchen albuterol (VENTOLIN HFA) 108 (90 BASE) MCG/ACT inhaler Inhale 2 puffs into the lungs every 6 (six) hours as needed.        Marland Kitchen  dexlansoprazole (DEXILANT) 60 MG capsule Take 1 capsule (60 mg total) by mouth daily.  20 capsule  0  . DISCONTD: famotidine (PEPCID) 10 MG tablet Take 20 mg by mouth at bedtime.

## 2011-07-07 NOTE — Assessment & Plan Note (Signed)
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

## 2011-07-07 NOTE — Patient Instructions (Signed)
Stay on [place] daily, use samples until shipment arrives No other medication changes Return 4 months

## 2011-07-22 ENCOUNTER — Telehealth: Payer: Self-pay | Admitting: Critical Care Medicine

## 2011-07-22 NOTE — Telephone Encounter (Signed)
2 boxes left up front for pt and spoke with pt and notified of this.

## 2011-07-30 ENCOUNTER — Ambulatory Visit: Payer: Self-pay

## 2011-08-07 ENCOUNTER — Telehealth: Payer: Self-pay | Admitting: Critical Care Medicine

## 2011-08-07 DIAGNOSIS — K219 Gastro-esophageal reflux disease without esophagitis: Secondary | ICD-10-CM

## 2011-08-07 NOTE — Telephone Encounter (Signed)
I spoke with pt and she states she thinks the dexilant is causing her to have side effects. Pt states for 2 weeks now she has been having upset stomach, soft stools, and hives on the back of her neck. Pt states her pcp is giving her something to help with the hives. Pt states she read something about being on dexilant to long can cause these side effects. Pt has been on dexilant since 04/08/11. Pt states she has not started anything new to have these reactions. Pt is wanting to know Dr. Bettina Gavia thoughts on this. I advised pt to stop the dexilant to see if it is causing these side effects or not. She voiced her understanding. Pt aware he is out of the office this week and will not return until next week and was fine with this. Pt states she only wants this to go to Dr. Joya Gaskins.  Please advise , thanks

## 2011-08-08 ENCOUNTER — Encounter: Payer: Self-pay | Admitting: Gastroenterology

## 2011-08-08 ENCOUNTER — Telehealth: Payer: Self-pay | Admitting: Critical Care Medicine

## 2011-08-08 NOTE — Telephone Encounter (Signed)
Stop dexilant for now Needs referral to GI for alternatives and eval of GERD

## 2011-08-08 NOTE — Telephone Encounter (Signed)
I spoke with pt and she states she was calling to make [place] aware that she is not sure what GI physician will accept her insurance. I advised her that our [facility] will find someone who does. She voiced her understanding

## 2011-08-08 NOTE — Telephone Encounter (Signed)
I spoke with pt and she is aware of [facility] recs. Referral has been placed.

## 2011-08-25 ENCOUNTER — Ambulatory Visit (INDEPENDENT_AMBULATORY_CARE_PROVIDER_SITE_OTHER): Payer: Self-pay | Admitting: Gastroenterology

## 2011-08-25 ENCOUNTER — Encounter: Payer: Self-pay | Admitting: Gastroenterology

## 2011-08-25 VITALS — BP 136/80 | HR 64 | Ht 63.0 in | Wt 152.0 lb

## 2011-08-25 DIAGNOSIS — Z86010 Personal history of colonic polyps: Secondary | ICD-10-CM

## 2011-08-25 DIAGNOSIS — K219 Gastro-esophageal reflux disease without esophagitis: Secondary | ICD-10-CM

## 2011-08-25 DIAGNOSIS — R197 Diarrhea, unspecified: Secondary | ICD-10-CM

## 2011-08-25 MED ORDER — HYOSCYAMINE SULFATE ER 0.375 MG PO TB12: 375.0000 ug | ORAL_TABLET | Freq: Two times a day (BID) | ORAL | Status: DC

## 2011-08-25 NOTE — Patient Instructions (Addendum)
Go directly to the basement to get your containers for your stool cultures.  You have been scheduled for a Upper Endoscopy. See separate instructions. A prescription for Levbid has been sent to your pharmacy.  Patient advised to avoid spicy, acidic, citrus, chocolate, mints, fruit and fruit juices.  Limit the intake of caffeine, alcohol and Soda.  Don't exercise too soon after eating.  Don't lie down within 3-4 hours of eating.  Elevate the head of your bed. cc: Asencion Noble, MD

## 2011-08-25 NOTE — Progress Notes (Addendum)
History of Present Illness: This is a 66 year old female with GERD and a history of colon polyps who is followed by Dr. Teena Irani. She has a history of a chronic cough which apparently has improved substantially, but has not resolved. with treatment of GERD. According to the patient her Medicaid expired and she has been unable to schedule an endoscopy as recommended by Dr. Amedeo Plenty until her Medicaid is reinstated. She is now referred to me by Dr. Asencion Noble. She takes Dexilant for her reflux which appears to control her reflux quite well however she has occasional breakthrough symptoms. She states she takes Dexilant as needed. She has a prior history of constipation using MiraLax on an occasional basis. For the past few months she has noted 4-5 loose, urgent, watery, nonbloody bowel movements on a daily basis with intestinal bloating and rumbling. She underwent colonoscopy by Dr. Teena Irani in January 2012 showing adenomatous colon polyps. Denies weight loss, abdominal pain, constipation, diarrhea, change in stool caliber, melena, hematochezia, nausea, vomiting, dysphagia, reflux symptoms, chest pain.   Review of Systems: Pertinent positive and negative review of systems were noted in the above HPI section. All other review of systems were otherwise negative.   Current Medications, Allergies, Past Medical History, Past Surgical History, Family History and Social History were reviewed in [employer] record.   Physical Exam: General: Well developed , well nourished, no acute distress Head: Normocephalic and atraumatic Eyes:  sclerae anicteric, EOMI Ears: Normal auditory acuity Mouth: No deformity or lesions Neck: Supple, no masses or thyromegaly Lungs: Clear throughout to auscultation Heart: Regular rate and rhythm; no murmurs, rubs or bruits Abdomen: Soft, non tender and non distended. No masses, hepatosplenomegaly or hernias noted. Normal Bowel sounds Musculoskeletal:  Symmetrical with no gross deformities  Skin: No lesions on visible extremities Pulses:  Normal pulses noted Extremities: No clubbing, cyanosis, edema or deformities noted Neurological: Alert oriented x 4, grossly nonfocal Cervical Nodes:  No significant cervical adenopathy Inguinal Nodes: No significant inguinal adenopathy Psychological:  Alert and cooperative. Normal mood and affect  Assessment and Recommendations:  1. Chronic GERD. Cough may be related to chronic GERD. She's advised to take her Dexilant every morning and not as needed. Standard instructions on antireflux measures. Schedule endoscopy to rule out erosive esophagitis and Barrett's esophagus. The patient will decide on long-term followup with either me or Dr. Teena Irani.  2. Personal history of adenomatous colon polyps. Dr. Amedeo Plenty had recommended a followup colonoscopy in 2 years which would be January 2014. The patient will decide on long-term followup with either me or Dr. Teena Irani.  3. Diarrhea associated with bloating. Prior history of constipation. Obtain standard stool studies to rule out infectious etiologies. Possible irritable bowel syndrome. Trial of Levbid twice a day.

## 2011-08-26 ENCOUNTER — Other Ambulatory Visit: Payer: Self-pay

## 2011-08-26 DIAGNOSIS — R197 Diarrhea, unspecified: Secondary | ICD-10-CM

## 2011-08-27 LAB — CLOSTRIDIUM DIFFICILE BY PCR: Toxigenic C. Difficile by PCR: NOT DETECTED

## 2011-08-30 LAB — STOOL CULTURE

## 2011-09-01 ENCOUNTER — Other Ambulatory Visit: Payer: Self-pay | Admitting: Obstetrics and Gynecology

## 2011-09-01 DIAGNOSIS — Z1231 Encounter for screening mammogram for malignant neoplasm of breast: Secondary | ICD-10-CM

## 2011-09-02 ENCOUNTER — Ambulatory Visit (INDEPENDENT_AMBULATORY_CARE_PROVIDER_SITE_OTHER): Payer: Self-pay | Admitting: *Deleted

## 2011-09-02 ENCOUNTER — Ambulatory Visit (HOSPITAL_COMMUNITY)
Admission: RE | Admit: 2011-09-02 | Discharge: 2011-09-02 | Disposition: A | Discharge status: Home / Self Care | Payer: Self-pay | Source: Ambulatory Visit | Attending: Obstetrics and Gynecology | Admitting: Obstetrics and Gynecology

## 2011-09-02 VITALS — BP 137/83 | HR 84 | Temp 97.9°F | Ht 63.0 in | Wt 152.3 lb

## 2011-09-02 DIAGNOSIS — Z0000 Encounter for general adult medical examination without abnormal findings: Secondary | ICD-10-CM

## 2011-09-02 DIAGNOSIS — Z1231 Encounter for screening mammogram for malignant neoplasm of breast: Secondary | ICD-10-CM

## 2011-09-02 DIAGNOSIS — Z01419 Encounter for gynecological examination (general) (routine) without abnormal findings: Secondary | ICD-10-CM

## 2011-09-02 NOTE — Patient Instructions (Signed)
Taught patient how to perform BSE and gave educational materials to take home. Let her know [facility] will cover Pap smears every 3 years unless has a history of abnormal Pap smears. Patient is escorted to mammography for a screening mammogram. Let patient know will follow up with her within the next few weeks with results by letter or phone. Patient verbalized understanding.

## 2011-09-02 NOTE — Progress Notes (Signed)
No complaints today.  Pap Smear:    Pap smear completed today. Patients last Pap smear was in 2011 per patient but patient was unsure. Per patient no history of an abnormal Pap smear. No Pap smear results are in EPIC.  Physical exam: Breasts Breasts symmetrical. No skin abnormalities bilateral breasts. Scar at the left axilla where patient stated she has had some lymph nodes removed. No nipple retraction bilateral breasts. No nipple discharge bilateral breasts. No lymphadenopathy. No lumps palpated bilateral breasts. No complaints of pain or tenderness on palpation.          Pelvic/Bimanual   Ext Genitalia No lesions, no swelling and no discharge observed on external genitalia.         Vagina Vagina pink and normal texture. No lesions or discharge observed in vagina.          Cervix Cervix is present. Cervix pink and of normal texture. Cervix friable and positioned to the left. No discharge observed.     Uterus Uterus is present and palpable. Uterus normal size.        Adnexae Bilateral ovaries present and palpable. No tenderness on palpation.          Rectovaginal No rectal exam completed today since patient had no rectal complaints. No skin abnormalities observed on exam.

## 2011-09-11 ENCOUNTER — Ambulatory Visit (AMBULATORY_SURGERY_CENTER): Payer: Self-pay | Admitting: Gastroenterology

## 2011-09-11 ENCOUNTER — Encounter: Payer: Self-pay | Admitting: Gastroenterology

## 2011-09-11 VITALS — BP 150/84 | HR 81 | Temp 97.8°F | Resp 19 | Ht 63.0 in | Wt 152.0 lb

## 2011-09-11 DIAGNOSIS — K219 Gastro-esophageal reflux disease without esophagitis: Secondary | ICD-10-CM

## 2011-09-11 LAB — GLUCOSE, CAPILLARY
Glucose-Capillary: 105 mg/dL — ABNORMAL HIGH (ref 70–99)
Glucose-Capillary: 97 mg/dL (ref 70–99)

## 2011-09-11 MED ORDER — SODIUM CHLORIDE 0.9 % IV SOLN: 500.0000 mL | INTRAVENOUS | Status: DC

## 2011-09-11 NOTE — Op Note (Signed)
[place] [address] [place], [place]  [address]  ENDOSCOPY PROCEDURE REPORT  PATIENT:  Paula, [name]  MR#:  [id number] BIRTHDATE:  10-May-1946, 65 yrs. old  GENDER:  female ENDOSCOPIST:  Norberto Sorenson T. Fuller Plan, MD, [facility] Referred by:  Burnett Harry. Joya Gaskins, M.D. PROCEDURE DATE:  09/11/2011 PROCEDURE:  EGD, diagnostic 43235 ASA CLASS:  Class II INDICATIONS:  GERD MEDICATIONS:      These medications were titrated to patient response per physician's verbal order, Fentanyl 50 mcg IV, Versed 4 mg IV TOPICAL ANESTHETIC:  Cetacaine Spray DESCRIPTION OF PROCEDURE:   After the risks benefits and alternatives of the procedure were thoroughly explained, informed consent was obtained.  The LB GIF-H180 [id number] endoscope was introduced through the mouth and advanced to the second portion of the duodenum, without limitations.  The instrument was slowly withdrawn as the mucosa was fully examined. <<PROCEDUREIMAGES>> The esophagus and gastroesophageal junction were completely normal in appearance.  The stomach was entered and closely examined. The pylorus, antrum, angularis, and lesser curvature were well visualized, including a retroflexed view of the cardia and fundus. The stomach wall was normally distensable. The scope passed easily through the pylorus into the duodenum.  The duodenal bulb was normal in appearance, as was the postbulbar duodenum. Retroflexed views revealed no abnormalities.    The scope was then withdrawn from the patient and the procedure completed.  COMPLICATIONS:  None  ENDOSCOPIC IMPRESSION: 1) Normal EGD  RECOMMENDATIONS: 1) Anti-reflux regimen long term 2) Continue daily PPI long term  Devesh Monforte T. Fuller Plan, MD, [name]  n. eSIGNED:   Pricilla Riffle. Yeny Schmoll at 09/11/2011 10:18 AM  [place], [place], [id number]

## 2011-09-11 NOTE — Patient Instructions (Signed)
FOLLOW DISCHARGE INSTRUCTIONS (BLUE AND GREEN SHEETS).. 

## 2011-09-11 NOTE — Progress Notes (Signed)
Patient did not experience any of the following events: a burn prior to discharge; a fall within the facility; wrong site/side/patient/procedure/implant event; or a hospital transfer or hospital admission upon discharge from the facility. (G8907) Patient did not have preoperative order for IV antibiotic SSI prophylaxis. (G8918)  

## 2011-09-11 NOTE — Progress Notes (Signed)
The pt has a non-productive cough.  I asked her how long she has been coughing.  She responed 18 years.  MAW  The pt tolerated the EGD very well. Maw

## 2011-09-12 ENCOUNTER — Telehealth: Payer: Self-pay | Admitting: *Deleted

## 2011-09-12 NOTE — Telephone Encounter (Signed)
  Follow up Call-  Call back number 09/11/2011  Post procedure Call Back phone  # [phone]  Permission to leave phone message Yes     Patient questions:  Do you have a fever, pain , or abdominal swelling? no Pain Score  0 *  Have you tolerated food without any problems? no  Have you been able to return to your normal activities? no  Do you have any questions about your discharge instructions: Diet   no Medications  no Follow up visit  no  Do you have questions or concerns about your Care? no  Actions: * If pain score is 4 or above: No action needed, pain <4.

## 2011-09-16 ENCOUNTER — Encounter: Payer: Self-pay | Admitting: Obstetrics and Gynecology

## 2011-10-10 ENCOUNTER — Other Ambulatory Visit: Payer: Self-pay

## 2011-10-10 ENCOUNTER — Observation Stay (HOSPITAL_COMMUNITY)
Admission: EM | Admit: 2011-10-10 | Discharge: 2011-10-11 | Disposition: A | Discharge status: Home / Self Care | Payer: Medicare Other | Attending: Internal Medicine | Admitting: Internal Medicine

## 2011-10-10 ENCOUNTER — Encounter (HOSPITAL_COMMUNITY): Payer: Self-pay

## 2011-10-10 ENCOUNTER — Emergency Department (HOSPITAL_COMMUNITY): Payer: Medicare Other

## 2011-10-10 DIAGNOSIS — Z79899 Other long term (current) drug therapy: Secondary | ICD-10-CM | POA: Insufficient documentation

## 2011-10-10 DIAGNOSIS — I10 Essential (primary) hypertension: Secondary | ICD-10-CM | POA: Diagnosis present

## 2011-10-10 DIAGNOSIS — I129 Hypertensive chronic kidney disease with stage 1 through stage 4 chronic kidney disease, or unspecified chronic kidney disease: Secondary | ICD-10-CM | POA: Insufficient documentation

## 2011-10-10 DIAGNOSIS — E785 Hyperlipidemia, unspecified: Secondary | ICD-10-CM | POA: Diagnosis present

## 2011-10-10 DIAGNOSIS — R5381 Other malaise: Secondary | ICD-10-CM | POA: Insufficient documentation

## 2011-10-10 DIAGNOSIS — M79609 Pain in unspecified limb: Secondary | ICD-10-CM | POA: Insufficient documentation

## 2011-10-10 DIAGNOSIS — I5033 Acute on chronic diastolic (congestive) heart failure: Secondary | ICD-10-CM | POA: Diagnosis present

## 2011-10-10 DIAGNOSIS — I5032 Chronic diastolic (congestive) heart failure: Secondary | ICD-10-CM | POA: Diagnosis present

## 2011-10-10 DIAGNOSIS — E1140 Type 2 diabetes mellitus with diabetic neuropathy, unspecified: Secondary | ICD-10-CM | POA: Diagnosis present

## 2011-10-10 DIAGNOSIS — N183 Chronic kidney disease, stage 3 unspecified: Secondary | ICD-10-CM | POA: Diagnosis present

## 2011-10-10 DIAGNOSIS — K219 Gastro-esophageal reflux disease without esophagitis: Secondary | ICD-10-CM | POA: Insufficient documentation

## 2011-10-10 DIAGNOSIS — E1149 Type 2 diabetes mellitus with other diabetic neurological complication: Secondary | ICD-10-CM | POA: Insufficient documentation

## 2011-10-10 DIAGNOSIS — N289 Disorder of kidney and ureter, unspecified: Secondary | ICD-10-CM | POA: Insufficient documentation

## 2011-10-10 DIAGNOSIS — G319 Degenerative disease of nervous system, unspecified: Secondary | ICD-10-CM | POA: Insufficient documentation

## 2011-10-10 DIAGNOSIS — E876 Hypokalemia: Secondary | ICD-10-CM | POA: Diagnosis present

## 2011-10-10 DIAGNOSIS — R0789 Other chest pain: Secondary | ICD-10-CM | POA: Insufficient documentation

## 2011-10-10 DIAGNOSIS — I509 Heart failure, unspecified: Secondary | ICD-10-CM | POA: Insufficient documentation

## 2011-10-10 DIAGNOSIS — E119 Type 2 diabetes mellitus without complications: Secondary | ICD-10-CM | POA: Diagnosis present

## 2011-10-10 DIAGNOSIS — Z23 Encounter for immunization: Secondary | ICD-10-CM | POA: Insufficient documentation

## 2011-10-10 DIAGNOSIS — R42 Dizziness and giddiness: Principal | ICD-10-CM | POA: Diagnosis present

## 2011-10-10 DIAGNOSIS — E860 Dehydration: Secondary | ICD-10-CM | POA: Insufficient documentation

## 2011-10-10 DIAGNOSIS — R531 Weakness: Secondary | ICD-10-CM

## 2011-10-10 DIAGNOSIS — I079 Rheumatic tricuspid valve disease, unspecified: Secondary | ICD-10-CM | POA: Insufficient documentation

## 2011-10-10 DIAGNOSIS — R0602 Shortness of breath: Secondary | ICD-10-CM | POA: Insufficient documentation

## 2011-10-10 DIAGNOSIS — E1142 Type 2 diabetes mellitus with diabetic polyneuropathy: Secondary | ICD-10-CM | POA: Insufficient documentation

## 2011-10-10 HISTORY — DX: Other specified postprocedural states: Z98.890

## 2011-10-10 HISTORY — DX: Chronic diastolic (congestive) heart failure: I50.32

## 2011-10-10 HISTORY — DX: Nausea with vomiting, unspecified: R11.2

## 2011-10-10 HISTORY — DX: Polyneuropathy, unspecified: G62.9

## 2011-10-10 LAB — URINALYSIS, ROUTINE W REFLEX MICROSCOPIC
Bilirubin Urine: NEGATIVE
Glucose, UA: NEGATIVE mg/dL
Hgb urine dipstick: NEGATIVE
Ketones, ur: NEGATIVE mg/dL
Nitrite: NEGATIVE
Protein, ur: NEGATIVE mg/dL
Specific Gravity, Urine: 1.003 — ABNORMAL LOW (ref 1.005–1.030)
Urobilinogen, UA: 0.2 mg/dL (ref 0.0–1.0)
pH: 7.5 (ref 5.0–8.0)

## 2011-10-10 LAB — CBC
HCT: 35.6 % — ABNORMAL LOW (ref 36.0–46.0)
HCT: 36.5 % (ref 36.0–46.0)
Hemoglobin: 12.5 g/dL (ref 12.0–15.0)
Hemoglobin: 12.7 g/dL (ref 12.0–15.0)
MCH: 28.3 pg (ref 26.0–34.0)
MCH: 28.3 pg (ref 26.0–34.0)
MCHC: 35.1 g/dL (ref 30.0–36.0)
MCHC: 34.8 g/dL (ref 30.0–36.0)
MCV: 80.5 fL (ref 78.0–100.0)
MCV: 81.5 fL (ref 78.0–100.0)
Platelets: 224 10*3/uL (ref 150–400)
Platelets: 239 10*3/uL (ref 150–400)
RBC: 4.42 MIL/uL (ref 3.87–5.11)
RBC: 4.48 MIL/uL (ref 3.87–5.11)
RDW: 13 % (ref 11.5–15.5)
RDW: 13.1 % (ref 11.5–15.5)
WBC: 10.4 10*3/uL (ref 4.0–10.5)
WBC: 10 10*3/uL (ref 4.0–10.5)

## 2011-10-10 LAB — BASIC METABOLIC PANEL
BUN: 25 mg/dL — ABNORMAL HIGH (ref 6–23)
BUN: 23 mg/dL (ref 6–23)
CO2: 28 mEq/L (ref 19–32)
CO2: 21 mEq/L (ref 19–32)
Calcium: 9.4 mg/dL (ref 8.4–10.5)
Calcium: 8.7 mg/dL (ref 8.4–10.5)
Chloride: 99 mEq/L (ref 96–112)
Chloride: 103 mEq/L (ref 96–112)
Creatinine, Ser: 1.32 mg/dL — ABNORMAL HIGH (ref 0.50–1.10)
Creatinine, Ser: 1.11 mg/dL — ABNORMAL HIGH (ref 0.50–1.10)
GFR calc Af Amer: 48 mL/min — ABNORMAL LOW (ref >90)
GFR calc Af Amer: 59 mL/min — ABNORMAL LOW (ref >90)
GFR calc non Af Amer: 41 mL/min — ABNORMAL LOW (ref >90)
GFR calc non Af Amer: 51 mL/min — ABNORMAL LOW (ref >90)
Glucose, Bld: 138 mg/dL — ABNORMAL HIGH (ref 70–99)
Glucose, Bld: 89 mg/dL (ref 70–99)
Potassium: 3.2 mEq/L — ABNORMAL LOW (ref 3.5–5.1)
Potassium: 3.4 mEq/L — ABNORMAL LOW (ref 3.5–5.1)
Sodium: 137 mEq/L (ref 135–145)
Sodium: 138 mEq/L (ref 135–145)

## 2011-10-10 LAB — GLUCOSE, CAPILLARY
Glucose-Capillary: 114 mg/dL — ABNORMAL HIGH (ref 70–99)
Glucose-Capillary: 72 mg/dL (ref 70–99)
Glucose-Capillary: 114 mg/dL — ABNORMAL HIGH (ref 70–99)

## 2011-10-10 LAB — RAPID URINE DRUG SCREEN, HOSP PERFORMED
Amphetamines: NOT DETECTED
Barbiturates: NOT DETECTED
Benzodiazepines: NOT DETECTED
Cocaine: NOT DETECTED
Opiates: NOT DETECTED
Tetrahydrocannabinol: NOT DETECTED

## 2011-10-10 LAB — PROTIME-INR
INR: 1.08 (ref 0.00–1.49)
INR: 1.14 (ref 0.00–1.49)
Prothrombin Time: 14.2 seconds (ref 11.6–15.2)
Prothrombin Time: 14.8 seconds (ref 11.6–15.2)

## 2011-10-10 LAB — CARDIAC PANEL(CRET KIN+CKTOT+MB+TROPI)
CK, MB: 2.4 ng/mL (ref 0.3–4.0)
Relative Index: 2.1 (ref 0.0–2.5)
Total CK: 112 U/L (ref 7–177)
Troponin I: <0.3 ng/mL (ref <0.30)

## 2011-10-10 LAB — POCT I-STAT TROPONIN I: Troponin i, poc: 0 ng/mL (ref 0.00–0.08)

## 2011-10-10 LAB — URINE MICROSCOPIC-ADD ON

## 2011-10-10 LAB — PRO B NATRIURETIC PEPTIDE: Pro B Natriuretic peptide (BNP): 27.5 pg/mL (ref 0–125)

## 2011-10-10 LAB — PHOSPHORUS: Phosphorus: 3 mg/dL (ref 2.3–4.6)

## 2011-10-10 LAB — APTT: aPTT: 26 seconds (ref 24–37)

## 2011-10-10 LAB — TROPONIN I
Troponin I: <0.3 ng/mL (ref <0.30)
Troponin I: <0.3 ng/mL (ref <0.30)

## 2011-10-10 LAB — MAGNESIUM: Magnesium: 1.5 mg/dL (ref 1.5–2.5)

## 2011-10-10 MED ORDER — AMLODIPINE BESYLATE 5 MG PO TABS
5.0000 mg | ORAL_TABLET | Freq: Every day | ORAL | Status: DC
  Administered 2011-10-11: 5 mg via ORAL
  Filled 2011-10-10 (×3): qty 1

## 2011-10-10 MED ORDER — SIMVASTATIN 10 MG PO TABS
10.0000 mg | ORAL_TABLET | Freq: Every day | ORAL | Status: DC
  Filled 2011-10-10 (×2): qty 1

## 2011-10-10 MED ORDER — HEPARIN SODIUM (PORCINE) 5000 UNIT/ML IJ SOLN: 5000.0000 [IU] | Freq: Three times a day (TID) | INTRAMUSCULAR | Status: DC

## 2011-10-10 MED ORDER — PANTOPRAZOLE SODIUM 40 MG PO TBEC
40.0000 mg | DELAYED_RELEASE_TABLET | Freq: Every day | ORAL | Status: DC
  Administered 2011-10-10 – 2011-10-11 (×2): 40 mg via ORAL
  Filled 2011-10-10 (×2): qty 1

## 2011-10-10 MED ORDER — GABAPENTIN 100 MG PO CAPS
100.0000 mg | ORAL_CAPSULE | Freq: Three times a day (TID) | ORAL | Status: DC
  Administered 2011-10-10 – 2011-10-11 (×2): 100 mg via ORAL
  Filled 2011-10-10 (×8): qty 1

## 2011-10-10 MED ORDER — SODIUM CHLORIDE 0.9 % IV SOLN
INTRAVENOUS | Status: DC
  Administered 2011-10-10: 22:00:00 via INTRAVENOUS

## 2011-10-10 MED ORDER — SODIUM CHLORIDE 0.9 % IJ SOLN
3.0000 mL | Freq: Two times a day (BID) | INTRAMUSCULAR | Status: DC
  Administered 2011-10-10 – 2011-10-11 (×2): 3 mL via INTRAVENOUS

## 2011-10-10 MED ORDER — ALBUTEROL SULFATE HFA 108 (90 BASE) MCG/ACT IN AERS: 2.0000 | INHALATION_SPRAY | Freq: Four times a day (QID) | RESPIRATORY_TRACT | Status: DC | PRN

## 2011-10-10 MED ORDER — ONDANSETRON HCL 4 MG/2ML IJ SOLN: 4.0000 mg | Freq: Three times a day (TID) | INTRAMUSCULAR | Status: AC | PRN

## 2011-10-10 MED ORDER — PNEUMOCOCCAL VAC POLYVALENT 25 MCG/0.5ML IJ INJ
0.5000 mL | INJECTION | INTRAMUSCULAR | Status: AC
  Administered 2011-10-11: 0.5 mL via INTRAMUSCULAR
  Filled 2011-10-10: qty 0.5

## 2011-10-10 MED ORDER — SODIUM CHLORIDE 0.9 % IV SOLN
INTRAVENOUS | Status: DC
  Administered 2011-10-11: 09:00:00 via INTRAVENOUS

## 2011-10-10 MED ORDER — INSULIN ASPART 100 UNIT/ML ~~LOC~~ SOLN: 0.0000 [IU] | Freq: Every day | SUBCUTANEOUS | Status: DC

## 2011-10-10 MED ORDER — POLYVINYL ALCOHOL 1.4 % OP SOLN
1.0000 [drp] | Freq: Two times a day (BID) | OPHTHALMIC | Status: DC
  Administered 2011-10-10 – 2011-10-11 (×2): 1 [drp] via OPHTHALMIC
  Filled 2011-10-10: qty 15

## 2011-10-10 MED ORDER — ASPIRIN EC 81 MG PO TBEC
81.0000 mg | DELAYED_RELEASE_TABLET | Freq: Every day | ORAL | Status: DC
  Administered 2011-10-11: 81 mg via ORAL
  Filled 2011-10-10 (×2): qty 1

## 2011-10-10 MED ORDER — ENOXAPARIN SODIUM 30 MG/0.3ML ~~LOC~~ SOLN
30.0000 mg | SUBCUTANEOUS | Status: DC
  Administered 2011-10-10: 30 mg via SUBCUTANEOUS
  Filled 2011-10-10 (×2): qty 0.3

## 2011-10-10 MED ORDER — POLYETHYL GLYCOL-PROPYL GLYCOL 0.4-0.3 % OP SOLN: 1.0000 [drp] | Freq: Two times a day (BID) | OPHTHALMIC | Status: DC

## 2011-10-10 MED ORDER — POTASSIUM CHLORIDE CRYS ER 20 MEQ PO TBCR
20.0000 meq | EXTENDED_RELEASE_TABLET | Freq: Once | ORAL | Status: AC
  Administered 2011-10-10: 20 meq via ORAL

## 2011-10-10 MED ORDER — INSULIN ASPART 100 UNIT/ML ~~LOC~~ SOLN
0.0000 [IU] | Freq: Three times a day (TID) | SUBCUTANEOUS | Status: DC
  Filled 2011-10-10: qty 3

## 2011-10-10 MED ORDER — CALCIUM CARBONATE-VITAMIN D 600-200 MG-UNIT PO TABS
1.0000 | ORAL_TABLET | Freq: Every day | ORAL | Status: DC
Start: 2011-10-10 — End: 2011-10-10

## 2011-10-10 MED ORDER — ASPIRIN 81 MG PO TABS: 81.0000 mg | ORAL_TABLET | Freq: Every day | ORAL | Status: DC

## 2011-10-10 MED ORDER — SODIUM CHLORIDE 0.9 % IV BOLUS (SEPSIS)
1000.0000 mL | Freq: Once | INTRAVENOUS | Status: AC
  Administered 2011-10-10: 1000 mL via INTRAVENOUS

## 2011-10-10 MED ORDER — ACETAMINOPHEN 500 MG PO TABS: 500.0000 mg | ORAL_TABLET | Freq: Four times a day (QID) | ORAL | Status: DC | PRN

## 2011-10-10 MED ORDER — VERAPAMIL HCL 80 MG PO TABS
80.0000 mg | ORAL_TABLET | Freq: Two times a day (BID) | ORAL | Status: DC
  Administered 2011-10-10 – 2011-10-11 (×2): 80 mg via ORAL
  Filled 2011-10-10 (×5): qty 1

## 2011-10-10 MED ORDER — METFORMIN HCL 500 MG PO TABS
500.0000 mg | ORAL_TABLET | Freq: Two times a day (BID) | ORAL | Status: DC
  Administered 2011-10-11: 500 mg via ORAL
  Filled 2011-10-10 (×4): qty 1

## 2011-10-10 MED ORDER — HYDROCHLOROTHIAZIDE 25 MG PO TABS
25.0000 mg | ORAL_TABLET | Freq: Every day | ORAL | Status: DC
  Administered 2011-10-11: 25 mg via ORAL
  Filled 2011-10-10 (×3): qty 1

## 2011-10-10 MED ORDER — CALCIUM CARBONATE-VITAMIN D 500-200 MG-UNIT PO TABS
1.0000 | ORAL_TABLET | Freq: Every day | ORAL | Status: DC
  Administered 2011-10-11: 1 via ORAL
  Filled 2011-10-10 (×2): qty 1

## 2011-10-10 NOTE — ED Notes (Signed)
Patient transported to CT 

## 2011-10-10 NOTE — ED Notes (Signed)
[id number] Expected date:10/10/11<BR> Expected time:11:49 AM<BR> Means of arrival:Ambulance<BR> Comments:<BR> EMS 100 GC - dizzy/lightheaded

## 2011-10-10 NOTE — H&P (Signed)
PCP:  [facility], MD, MD   DOA:  10/10/2011 12:07 PM  Chief Complaint:  dizziness  HPI: 66 year old female with history of HTN, DM, dyslipidemia, acute on chronic kidney disease who presented to ED with complaints of dizziness, shortness of breath (on exertion) and left sided chest pain, 5-6/10 in intensity, non radiating and started at rest. 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 Patient has a complaint of some numbness and tingling sensation over lower bilateral extremities. Patient denies fever, chills, abdominal pain, dysuria, hematuria, blood in stool. Patient did have only 1 episode of emesis but no blood in it. Since the admission patient did not have any more nausea or vomiting. Patient will be admitted to hospitalist service for further evaluation and management.  Assessment/Plan  Principal Problem:   *Dizziness - unclear etiology - rule out cardiac arrhythmia - cycle cardiac enzymes, TSH, fasting lipid panel and 2 D ECHO - follow up CT head - PT evaluation  Active Problems:   DIABETES MELLITUS, TYPE II - CBG monitoring - sliding scale insulin - continue metformin   HYPERLIPIDEMIA - fasting lipid panel - continue paravachol   HYPERTENSION - uncontrolled; the goal BP should be <135/85 - as per ED physician patient is non compliant with medications - continue home medication   Hypokalemia - repleted in ED - follow up BMP in am   Acute on chronic kidney disease, stage 3 - creatinine stable on admission - continue to monitor  Disposition - to telemetry    Allergies: Allergies  Allergen Reactions  . Ace Inhibitors     REACTION: chronic cough    Prior to Admission medications   Medication Sig Start Date End Date Taking? Authorizing Provider  acetaminophen (TYLENOL) 500 MG tablet Take 500 mg by mouth every 6 (six) hours as needed. For pain.   Yes Historical Provider, MD  albuterol (VENTOLIN  HFA) 108 (90 BASE) MCG/ACT inhaler Inhale 2 puffs into the lungs every 6 (six) hours as needed. For shortness of breath.   Yes Historical Provider, MD  amLODipine (NORVASC) 5 MG tablet Take 5 mg by mouth daily.     Yes Historical Provider, MD  aspirin 81 MG tablet Take 81 mg by mouth daily.     Yes Historical Provider, MD  Calcium Carbonate-Vitamin D (CALCIUM 600+D) 600-200 MG-UNIT TABS Take 1 tablet by mouth daily.     Yes Historical Provider, MD  dexlansoprazole (DEXILANT) 60 MG capsule Take 60 mg by mouth daily.    Yes Historical Provider, MD  hydrochlorothiazide 25 MG tablet Take 25 mg by mouth daily.     Yes Historical Provider, MD  metFORMIN (GLUCOPHAGE) 500 MG tablet Take 500 mg by mouth 2 (two) times daily.     Yes Historical Provider, MD  Polyethyl Glycol-Propyl Glycol 0.4-0.3 % SOLN Place 1 drop into both eyes 2 (two) times daily.    Yes Historical Provider, MD  potassium gluconate 595 MG TABS Take 595 mg by mouth daily.   Yes Historical Provider, MD  pravastatin (PRAVACHOL) 20 MG tablet Take 20 mg by mouth daily.   Yes Historical Provider, MD  verapamil (CALAN) 80 MG tablet Take 80 mg by mouth 2 (two) times daily.    Yes Historical Provider, MD    Past Medical History  Diagnosis Date  . Allergic rhinitis   . Cough   . GERD (gastroesophageal reflux disease)   . Diabetes mellitus   . Hypertension   .  Hyperlipidemia   . History of colon polyps 2012    tubular adenoma   . Arthritis   . PONV (postoperative nausea and vomiting)     trouble waking up    Past Surgical History  Procedure Date  . Nasal turbinate reduction   . Lymphatic mass surgery   . Cataract extraction     right eye    Social History:  reports that she has never smoked. She has never used smokeless tobacco. She reports that she does not drink alcohol or use illicit drugs.  Family History  Problem Relation Age of Onset  . Asthma Sister   . Colon cancer Neg Hx   . Hypertension Mother     Review of  Systems:  Constitutional: Denies fever, chills, diaphoresis, appetite change and fatigue.  HEENT: Denies photophobia, eye pain, redness, hearing loss, ear pain, congestion, sore throat, rhinorrhea, sneezing, mouth sores, trouble swallowing, neck pain, neck stiffness and tinnitus.   Respiratory: Denies SOB, DOE, cough, chest tightness,  and wheezing.   Cardiovascular: Denies chest pain, palpitations and leg swelling.  Gastrointestinal: Denies nausea, vomiting, abdominal pain, diarrhea, constipation, blood in stool and abdominal distention.  Genitourinary: Denies dysuria, urgency, frequency, hematuria, flank pain and difficulty urinating.  Musculoskeletal: Denies myalgias, back pain, joint swelling, arthralgias and gait problem.  Skin: Denies pallor, rash and wound.  Neurological: Denies dizziness, seizures, syncope, weakness, light-headedness, numbness and headaches.  Hematological: Denies adenopathy. Easy bruising, personal or family bleeding history  Psychiatric/Behavioral: Denies suicidal ideation, mood changes, confusion, nervousness, sleep disturbance and agitation   Physical Exam:  Filed Vitals:   10/10/11 1211 10/10/11 1713 10/10/11 1715 10/10/11 1716  BP:  133/66 125/66 141/73  Pulse:  91 93 101  Temp:      TempSrc:      Resp:      Height: 5\' 4"  (1.626 m)     Weight: 68.947 kg (152 lb)     SpO2:        Constitutional: Vital signs reviewed.  Patient is a well-developed and well-nourished in no acute distress and cooperative with exam. Alert and oriented x3.  Head: Normocephalic and atraumatic Ear: TM normal bilaterally Mouth: no erythema or exudates, MMM Eyes: PERRL, EOMI, conjunctivae normal, No scleral icterus.  Neck: Supple, Trachea midline normal ROM, No JVD, mass, thyromegaly, or carotid bruit present.  Cardiovascular: RRR, S1 normal, S2 normal, no MRG, pulses symmetric and intact bilaterally Pulmonary/Chest: CTAB, no wheezes, rales, or rhonchi Abdominal: Soft.  Non-tender, non-distended, bowel sounds are normal, no masses, organomegaly, or guarding present.  GU: no CVA tenderness Musculoskeletal: No joint deformities, erythema, or stiffness, ROM full and no nontender Ext: no edema and no cyanosis, pulses palpable bilaterally (DP and PT) Hematology: no cervical, inginal, or axillary adenopathy.  Neurological: A&O x3, Strenght is normal and symmetric bilaterally, cranial nerve II-XII are grossly intact, no focal motor deficit, sensory intact to light touch bilaterally.  Skin: Warm, dry and intact. No rash, cyanosis, or clubbing.  Psychiatric: Normal mood and affect. speech and behavior is normal. Judgment and thought content normal. Cognition and memory are normal.   Labs on Admission:  Results for orders placed during the hospital encounter of 10/10/11 (from the past 48 hour(s))  GLUCOSE, CAPILLARY     Status: Abnormal   Collection Time   10/10/11  1:20 PM      Component Value Range Comment   Glucose-Capillary 114 (*) 70 - 99 (mg/dL)   CBC     Status: Abnormal  Collection Time   10/10/11  1:29 PM      Component Value Range Comment   WBC 10.4  4.0 - 10.5 (K/uL)    RBC 4.42  3.87 - 5.11 (MIL/uL)    Hemoglobin 12.5  12.0 - 15.0 (g/dL)    HCT 35.6 (*) 36.0 - 46.0 (%)    MCV 80.5  78.0 - 100.0 (fL)    MCH 28.3  26.0 - 34.0 (pg)    MCHC 35.1  30.0 - 36.0 (g/dL)    RDW 13.0  11.5 - 15.5 (%)    Platelets 224  150 - 400 (K/uL)   BASIC METABOLIC PANEL     Status: Abnormal   Collection Time   10/10/11  1:29 PM      Component Value Range Comment   Sodium 137  135 - 145 (mEq/L)    Potassium 3.2 (*) 3.5 - 5.1 (mEq/L)    Chloride 99  96 - 112 (mEq/L)    CO2 28  19 - 32 (mEq/L)    Glucose, Bld 138 (*) 70 - 99 (mg/dL)    BUN 25 (*) 6 - 23 (mg/dL)    Creatinine, Ser 1.32 (*) 0.50 - 1.10 (mg/dL)    Calcium 9.4  8.4 - 10.5 (mg/dL)    GFR calc non Af Amer 41 (*) >90 (mL/min)    GFR calc Af Amer 48 (*) >90 (mL/min)   PRO B NATRIURETIC PEPTIDE     Status:  Normal   Collection Time   10/10/11  1:29 PM      Component Value Range Comment   Pro B Natriuretic peptide (BNP) 27.5  0 - 125 (pg/mL)   TROPONIN I     Status: Normal   Collection Time   10/10/11  1:29 PM      Component Value Range Comment   Troponin I <0.30  <0.30 (ng/mL)   POCT I-STAT TROPONIN I     Status: Normal   Collection Time   10/10/11  1:37 PM      Component Value Range Comment   Troponin i, poc 0.00  0.00 - 0.08 (ng/mL)    Comment 3            TROPONIN I     Status: Normal   Collection Time   10/10/11  3:40 PM      Component Value Range Comment   Troponin I <0.30  <0.30 (ng/mL)   BASIC METABOLIC PANEL     Status: Abnormal   Collection Time   10/10/11  3:41 PM      Component Value Range Comment   Sodium 138  135 - 145 (mEq/L)    Potassium 3.4 (*) 3.5 - 5.1 (mEq/L)    Chloride 103  96 - 112 (mEq/L)    CO2 21  19 - 32 (mEq/L)    Glucose, Bld 89  70 - 99 (mg/dL)    BUN 23  6 - 23 (mg/dL)    Creatinine, Ser 1.11 (*) 0.50 - 1.10 (mg/dL)    Calcium 8.7  8.4 - 10.5 (mg/dL)    GFR calc non Af Amer 51 (*) >90 (mL/min)    GFR calc Af Amer 59 (*) >90 (mL/min)   PROTIME-INR     Status: Normal   Collection Time   10/10/11  4:10 PM      Component Value Range Comment   Prothrombin Time 14.2  11.6 - 15.2 (seconds)    INR 1.08  0.00 - 1.49    URINALYSIS,  ROUTINE W REFLEX MICROSCOPIC     Status: Abnormal   Collection Time   10/10/11  5:23 PM      Component Value Range Comment   Color, Urine YELLOW  YELLOW     APPearance CLEAR  CLEAR     Specific Gravity, Urine 1.003 (*) 1.005 - 1.030     pH 7.5  5.0 - 8.0     Glucose, UA NEGATIVE  NEGATIVE (mg/dL)    Hgb urine dipstick NEGATIVE  NEGATIVE     Bilirubin Urine NEGATIVE  NEGATIVE     Ketones, ur NEGATIVE  NEGATIVE (mg/dL)    Protein, ur NEGATIVE  NEGATIVE (mg/dL)    Urobilinogen, UA 0.2  0.0 - 1.0 (mg/dL)    Nitrite NEGATIVE  NEGATIVE     Leukocytes, UA SMALL (*) NEGATIVE    URINE MICROSCOPIC-ADD ON     Status: Normal   Collection  Time   10/10/11  5:23 PM      Component Value Range Comment   Squamous Epithelial / LPF RARE  RARE     WBC, UA 0-2  <3 (WBC/hpf)   CBC     Status: Normal   Collection Time   10/10/11  5:28 PM      Component Value Range Comment   WBC 10.0  4.0 - 10.5 (K/uL)    RBC 4.48  3.87 - 5.11 (MIL/uL)    Hemoglobin 12.7  12.0 - 15.0 (g/dL)    HCT 36.5  36.0 - 46.0 (%)    MCV 81.5  78.0 - 100.0 (fL)    MCH 28.3  26.0 - 34.0 (pg)    MCHC 34.8  30.0 - 36.0 (g/dL)    RDW 13.1  11.5 - 15.5 (%)    Platelets 239  150 - 400 (K/uL)   GLUCOSE, CAPILLARY     Status: Normal   Collection Time   10/10/11  6:27 PM      Component Value Range Comment   Glucose-Capillary 72  70 - 99 (mg/dL)    Comment 1 Notify RN      Comment 2 Documented in Chart        Time Spent on Admission: Greater than 30 minutes  Paula [name] 10/10/2011, 8:44 PM

## 2011-10-10 NOTE — ED Notes (Signed)
Pt brought by ems from home with multiple complaints, right lower extremity/right upper extremity pain and numbness and burning since 06/2011, and as of this am fatigue, weakness and emesis  x 1.

## 2011-10-10 NOTE — ED Provider Notes (Signed)
History     CSN: [id number]  Arrival date & time 10/10/11  1207   First MD Initiated Contact with Patient 10/10/11 1406      Chief Complaint  Patient presents with  . Weakness  . Fatigue  . Extremity Pain    right arm    (Consider location/radiation/quality/duration/timing/severity/associated sxs/prior treatment) Patient is a 66 y.o. female presenting with weakness. The history is provided by the patient.  Weakness The primary symptoms include dizziness, nausea and vomiting. Primary symptoms do not include headaches, syncope, loss of consciousness, altered mental status, seizures, visual change, paresthesias, focal weakness, loss of sensation or speech change. The symptoms began 12 to 24 hours ago. The symptoms are resolved. The neurological symptoms are diffuse.  Dizziness also occurs with nausea, vomiting and weakness. Dizziness does not occur with tinnitus.  Additional symptoms include weakness. Additional symptoms do not include neck stiffness, pain, lower back pain, leg pain, loss of balance, photophobia, aura, hallucinations, nystagmus, taste disturbance, hyperacusis, hearing loss, tinnitus, vertigo, anxiety, irritability or dysphoric mood.   Pt with hx DM, HTN, HLD presents from home with multiple complaints. She states that she got out of bed this morning around 4 am to use the restroom when she began to feel lightheaded/unsteady on her feet. She denies noticing a sensation of the room spinning around her. She then began to feel "weak all over." She denies any focal weakness in her arms or legs or loss of consciousness. She was able to ambulate after this began. States she then developed a sensation of nausea and had one episode of emesis. She did not have any associated chest pain, dyspnea, diaphoresis. Denies abd pain. She has not had any recent URI sx, and has not had any loss of appetite over the past several days.  Also states she's had intermittent RUE/RLE ext burning pain  since about Nov of 2012. She does have hx DM. She hasn't seen her PCP about this. She's not currently taking any medications such as gabapentin for this.  Past Medical History  Diagnosis Date  . Allergic rhinitis   . Cough   . GERD (gastroesophageal reflux disease)   . Diabetes mellitus   . Hypertension   . Hyperlipidemia   . History of colon polyps 2012    tubular adenoma   . Arthritis     Past Surgical History  Procedure Date  . Nasal turbinate reduction   . Lymphatic mass surgery   . Cataract extraction     Family History  Problem Relation Age of Onset  . Asthma Sister   . Colon cancer Neg Hx   . Hypertension Mother     History  Substance Use Topics  . Smoking status: Never Smoker   . Smokeless tobacco: Never Used  . Alcohol Use: No    OB History    Grav Para Term Preterm Abortions TAB SAB Ect Mult Living   5 4 4  1 1    4       Review of Systems  Constitutional: Negative for activity change, appetite change, irritability and unexpected weight change.  HENT: Negative for hearing loss, nosebleeds, congestion, sore throat, trouble swallowing, neck pain, neck stiffness and tinnitus.   Eyes: Negative for photophobia.  Respiratory: Negative for chest tightness and shortness of breath.   Cardiovascular: Negative for chest pain, palpitations and syncope.  Gastrointestinal: Positive for nausea and vomiting. Negative for abdominal pain and diarrhea.  Genitourinary: Negative for dysuria and decreased urine volume.  Musculoskeletal: Negative for  myalgias.  Skin: Negative for color change and rash.  Neurological: Positive for dizziness and weakness. Negative for vertigo, speech change, focal weakness, seizures, loss of consciousness, light-headedness, headaches, paresthesias and loss of balance.  Psychiatric/Behavioral: Negative for hallucinations, dysphoric mood and altered mental status.    Allergies  Ace inhibitors  Home Medications   Current Outpatient Rx  Name  Route Sig Dispense Refill  . ACETAMINOPHEN 500 MG PO TABS Oral Take 500 mg by mouth every 6 (six) hours as needed. For pain.    . ALBUTEROL SULFATE HFA 108 (90 BASE) MCG/ACT IN AERS Inhalation Inhale 2 puffs into the lungs every 6 (six) hours as needed. For shortness of breath.    . AMLODIPINE BESYLATE 5 MG PO TABS Oral Take 5 mg by mouth daily.      . ASPIRIN 81 MG PO TABS Oral Take 81 mg by mouth daily.      Marland Kitchen CALCIUM CARBONATE-VITAMIN D 600-200 MG-UNIT PO TABS Oral Take 1 tablet by mouth daily.      . DEXLANSOPRAZOLE 60 MG PO CPDR Oral Take 60 mg by mouth daily.     Marland Kitchen HYDROCHLOROTHIAZIDE 25 MG PO TABS Oral Take 25 mg by mouth daily.      Marland Kitchen METFORMIN HCL 500 MG PO TABS Oral Take 500 mg by mouth 2 (two) times daily.      Marland Kitchen POLYETHYL GLYCOL-PROPYL GLYCOL 0.4-0.3 % OP SOLN Both Eyes Place 1 drop into both eyes 2 (two) times daily.     Marland Kitchen POTASSIUM GLUCONATE 595 MG PO TABS Oral Take 595 mg by mouth daily.    Marland Kitchen PRAVASTATIN SODIUM 20 MG PO TABS Oral Take 20 mg by mouth daily.    Marland Kitchen VERAPAMIL HCL 80 MG PO TABS Oral Take 80 mg by mouth 2 (two) times daily.       BP 142/71  Pulse 85  Temp(Src) 99.1 F (37.3 C) (Oral)  Resp 19  Ht 5\' 4"  (1.626 m)  Wt 152 lb (68.947 kg)  BMI 26.09 kg/m2  SpO2 98%  Orthostatic VS negative for change.  Physical Exam  Nursing note and vitals reviewed. Constitutional: She is oriented to person, place, and time. She appears well-developed and well-nourished. No distress.  HENT:  Head: Normocephalic and atraumatic.  Right Ear: External ear normal.  Left Ear: External ear normal.  Mouth/Throat: Oropharynx is clear and moist. No oropharyngeal exudate.  Eyes: EOM are normal. Pupils are equal, round, and reactive to light.  Neck: Normal range of motion.  Cardiovascular: Normal rate, regular rhythm and normal heart sounds.   Pulmonary/Chest: Effort normal and breath sounds normal. No respiratory distress. She has no wheezes. She exhibits no tenderness.  Abdominal:  Soft. Bowel sounds are normal. There is no tenderness. There is no rebound and no guarding.  Musculoskeletal: Normal range of motion. She exhibits no edema.  Neurological: She is alert and oriented to person, place, and time. She displays normal reflexes. No cranial nerve deficit. She exhibits normal muscle tone. Coordination normal.       Grip strength equal b/l, no facial droop  Skin: Skin is warm and dry. No rash noted. She is not diaphoretic.  Psychiatric: She has a normal mood and affect.    ED Course  Procedures (including critical care time)   Date: 10/10/2011  Rate: 93  Rhythm: normal sinus rhythm  QRS Axis: normal  Intervals: normal  ST/T Wave abnormalities: T flat/negative diffusely  Conduction Disutrbances:none  Narrative Interpretation:   Old EKG Reviewed: Old  ECG from 04/21/11: T segments normal appearing    Labs Reviewed  GLUCOSE, CAPILLARY - Abnormal; Notable for the following:    Glucose-Capillary 114 (*)    All other components within normal limits  CBC - Abnormal; Notable for the following:    HCT 35.6 (*)    All other components within normal limits  BASIC METABOLIC PANEL - Abnormal; Notable for the following:    Potassium 3.2 (*)    Glucose, Bld 138 (*)    BUN 25 (*)    Creatinine, Ser 1.32 (*)    GFR calc non Af Amer 41 (*)    GFR calc Af Amer 48 (*)    All other components within normal limits  BASIC METABOLIC PANEL - Abnormal; Notable for the following:    Potassium 3.4 (*)    Creatinine, Ser 1.11 (*)    GFR calc non Af Amer 51 (*)    GFR calc Af Amer 59 (*)    All other components within normal limits  URINALYSIS, ROUTINE W REFLEX MICROSCOPIC - Abnormal; Notable for the following:    Specific Gravity, Urine 1.003 (*)    Leukocytes, UA SMALL (*)    All other components within normal limits  PRO B NATRIURETIC PEPTIDE  TROPONIN I  POCT I-STAT TROPONIN I  PROTIME-INR  TROPONIN I  CBC  GLUCOSE, CAPILLARY  URINE MICROSCOPIC-ADD ON  PROTIME-INR    CBC   No results found.   1. Weakness generalized       MDM  66yo F with episode of weakness which occurred early this morning. Labs remarkable for mild hypokalemia, elevated Cr. UA negative. Troponin negative. However, there does appear to be a change in her ECG from previous, with flattened T waves as compared with previous. Given the episode she suffered today in addition to the ECG changes will admit. I've discussed with Dr. Charlies Silvers of [facility] who agrees to admit for further eval.        Abran Richard, PA 10/10/11 2046

## 2011-10-10 NOTE — ED Notes (Signed)
Bed assignment made. Awaiting admission orders from hospitalist.

## 2011-10-10 NOTE — ED Provider Notes (Signed)
Medical screening examination/treatment/procedure(s) were performed by non-physician practitioner and as supervising physician I was immediately available for consultation/collaboration.  Jasper Riling. Alvino Chapel, MD 10/10/11 [phone]

## 2011-10-10 NOTE — Progress Notes (Addendum)
MEDICATION RELATED CONSULT NOTE - Lovenox for VTE Prophylaxis   Allergies  Allergen Reactions  . Ace Inhibitors     REACTION: chronic cough    Patient Measurements: Height: 5\' 4"  (162.6 cm) Weight: 150 lb 5.7 oz (68.2 kg) IBW/kg (Calculated) : 54.7     Labs:  Basename 10/10/11 2110 10/10/11 1728 10/10/11 1541 10/10/11 1329  WBC -- 10.0 -- 10.4  HGB -- 12.7 -- 12.5  HCT -- 36.5 -- 35.6*  PLT -- 239 -- 224  APTT 26 -- -- --  CREATININE -- -- 1.11* 1.32*  LABCREA -- -- -- --  CREATININE -- -- 1.11* 1.32*  CREAT24HRUR -- -- -- --  MG 1.5 -- -- --  PHOS 3.0 -- -- --  ALBUMIN -- -- -- --  PROT -- -- -- --  ALBUMIN -- -- -- --  AST -- -- -- --  ALT -- -- -- --  ALKPHOS -- -- -- --  BILITOT -- -- -- --  BILIDIR -- -- -- --  IBILI -- -- -- --   Estimated Creatinine Clearance: 47.9 ml/min (by C-G formula based on Cr of 1.11).   Assessment: Asked to review Lovenox dose ordered for VTE prophylaxis for 66 year-old female.  Presenting complaints include weakness, fatigue, dizziness, right arm pain.  Head CT showed no acute intracranial abnormalities.  PT and aPTT are within the normal ranges.  Renal function is adequate for the usual dose.  Plan:  Increase the Lovenox dose to 40 mg every 24 hours.  [place]. 10/10/2011,10:56 PM

## 2011-10-11 ENCOUNTER — Encounter (HOSPITAL_COMMUNITY): Payer: Self-pay | Admitting: Internal Medicine

## 2011-10-11 DIAGNOSIS — G629 Polyneuropathy, unspecified: Secondary | ICD-10-CM | POA: Insufficient documentation

## 2011-10-11 DIAGNOSIS — E1140 Type 2 diabetes mellitus with diabetic neuropathy, unspecified: Secondary | ICD-10-CM | POA: Diagnosis present

## 2011-10-11 DIAGNOSIS — I5033 Acute on chronic diastolic (congestive) heart failure: Secondary | ICD-10-CM | POA: Diagnosis present

## 2011-10-11 DIAGNOSIS — I5032 Chronic diastolic (congestive) heart failure: Secondary | ICD-10-CM | POA: Diagnosis present

## 2011-10-11 HISTORY — DX: Polyneuropathy, unspecified: G62.9

## 2011-10-11 HISTORY — DX: Chronic diastolic (congestive) heart failure: I50.32

## 2011-10-11 LAB — COMPREHENSIVE METABOLIC PANEL
ALT: 18 U/L (ref 0–35)
AST: 19 U/L (ref 0–37)
Albumin: 3.2 g/dL — ABNORMAL LOW (ref 3.5–5.2)
Alkaline Phosphatase: 54 U/L (ref 39–117)
BUN: 17 mg/dL (ref 6–23)
CO2: 21 mEq/L (ref 19–32)
Calcium: 9 mg/dL (ref 8.4–10.5)
Chloride: 108 mEq/L (ref 96–112)
Creatinine, Ser: 1.04 mg/dL (ref 0.50–1.10)
GFR calc Af Amer: 64 mL/min — ABNORMAL LOW (ref >90)
GFR calc non Af Amer: 55 mL/min — ABNORMAL LOW (ref >90)
Glucose, Bld: 111 mg/dL — ABNORMAL HIGH (ref 70–99)
Potassium: 3.6 mEq/L (ref 3.5–5.1)
Sodium: 142 mEq/L (ref 135–145)
Total Bilirubin: 0.3 mg/dL (ref 0.3–1.2)
Total Protein: 6.6 g/dL (ref 6.0–8.3)

## 2011-10-11 LAB — DIFFERENTIAL
Basophils Absolute: 0 10*3/uL (ref 0.0–0.1)
Basophils Relative: 1 % (ref 0–1)
Eosinophils Absolute: 0.2 10*3/uL (ref 0.0–0.7)
Eosinophils Relative: 4 % (ref 0–5)
Lymphocytes Relative: 20 % (ref 12–46)
Lymphs Abs: 1.2 10*3/uL (ref 0.7–4.0)
Monocytes Absolute: 0.6 10*3/uL (ref 0.1–1.0)
Monocytes Relative: 9 % (ref 3–12)
Neutro Abs: 4.2 10*3/uL (ref 1.7–7.7)
Neutrophils Relative %: 67 % (ref 43–77)

## 2011-10-11 LAB — GLUCOSE, CAPILLARY
Glucose-Capillary: 107 mg/dL — ABNORMAL HIGH (ref 70–99)
Glucose-Capillary: 144 mg/dL — ABNORMAL HIGH (ref 70–99)
Glucose-Capillary: 87 mg/dL (ref 70–99)

## 2011-10-11 LAB — CBC
HCT: 33.5 % — ABNORMAL LOW (ref 36.0–46.0)
Hemoglobin: 11.8 g/dL — ABNORMAL LOW (ref 12.0–15.0)
MCH: 28.4 pg (ref 26.0–34.0)
MCHC: 35.2 g/dL (ref 30.0–36.0)
MCV: 80.7 fL (ref 78.0–100.0)
Platelets: 187 10*3/uL (ref 150–400)
RBC: 4.15 MIL/uL (ref 3.87–5.11)
RDW: 13.3 % (ref 11.5–15.5)
WBC: 6.3 10*3/uL (ref 4.0–10.5)

## 2011-10-11 LAB — CARDIAC PANEL(CRET KIN+CKTOT+MB+TROPI)
CK, MB: 3 ng/mL (ref 0.3–4.0)
CK, MB: 3.3 ng/mL (ref 0.3–4.0)
Relative Index: 2.3 (ref 0.0–2.5)
Relative Index: 2.3 (ref 0.0–2.5)
Total CK: 130 U/L (ref 7–177)
Total CK: 144 U/L (ref 7–177)
Troponin I: <0.3 ng/mL (ref <0.30)
Troponin I: <0.3 ng/mL (ref <0.30)

## 2011-10-11 LAB — HEMOGLOBIN A1C
Hgb A1c MFr Bld: 6.1 % — ABNORMAL HIGH (ref <5.7)
Mean Plasma Glucose: 128 mg/dL — ABNORMAL HIGH (ref <117)

## 2011-10-11 LAB — TSH: TSH: 1.725 u[IU]/mL (ref 0.350–4.500)

## 2011-10-11 MED ORDER — AMITRIPTYLINE HCL 25 MG PO TABS: 25.0000 mg | ORAL_TABLET | Freq: Every day | ORAL | Status: DC

## 2011-10-11 MED ORDER — ENOXAPARIN SODIUM 40 MG/0.4ML ~~LOC~~ SOLN
40.0000 mg | SUBCUTANEOUS | Status: DC
  Filled 2011-10-11 (×2): qty 0.4

## 2011-10-11 MED ORDER — HYDROCHLOROTHIAZIDE 25 MG PO TABS: 12.5000 mg | ORAL_TABLET | Freq: Every day | ORAL | Status: DC

## 2011-10-11 NOTE — Progress Notes (Signed)
Gave pt discharge instructions and explained them to her. Pt verbalized understanding of instructions given.

## 2011-10-11 NOTE — Progress Notes (Signed)
  Echocardiogram 2D Echocardiogram has been performed.  Paula [name] Ellery Plunk 10/11/2011, 10:37 AM

## 2011-10-11 NOTE — Discharge Instructions (Signed)
Near-Syncope Near-syncope is sudden weakness, dizziness, or feeling like you might pass out (faint). This can happen when getting up or while standing for a long time. It can be caused by a drop in blood pressure. It is common in people taking medicine for blood pressure. Fainting can happen when the blood pressure or pulse is too low. HOME CARE  If you feel like you are going to pass out:   Lie down right away.   Breathe deeply and steadily.   Move only when the feeling has gone away. Most of the time, this feeling lasts only a few minutes. You may feel tired for several hours.   Drink enough fluids to keep your pee (urine) clear or pale yellow.   If you are taking blood pressure or heart medicine, stand up slowly.  GET HELP RIGHT AWAY IF:   You have a severe headache.   Unusual pain develops in the chest, belly (abdomen), or back.   You have bleeding from the mouth or butt (rectum), or you have black or tarry poop (stool).   You feel your heart beat differently than normal, or you have a very fast pulse.   You pass out, or you twitch and shake when you pass out.   You pass out when sitting or lying down.   You feel confused.   You have trouble walking.   You are weak.   You have vision problems.  MAKE SURE YOU:   Understand these instructions.   Will watch your condition.   Will get help right away if you are not doing well or get worse.  Document Released: 01/14/2008 Document Revised: 04/09/2011 Document Reviewed: 09/13/2010 [facility] Patient Information 2012 [place].

## 2011-10-11 NOTE — Progress Notes (Signed)
Pt refuses to take insulin stating that she takes metformin

## 2011-10-11 NOTE — Discharge Summary (Signed)
Physician Discharge Summary  Patient ID: Paula [name] MRN: [id number] DOB/AGE: 1946-04-07 66 y.o.  Admit date: 10/10/2011 Discharge date: 10/11/2011  Primary Care Physician:  Cathlean Cower, MD, MD   Discharge Diagnoses:    Present on Admission:  .DIABETES MELLITUS, TYPE II .HYPERLIPIDEMIA .HYPERTENSION .Hypokalemia .Acute on chronic kidney disease, stage 3 .Dizziness .Neuropathy .Diastolic CHF, chronic  Discharge Medications:  Medication List  As of 10/11/2011  4:41 PM   TAKE these medications         acetaminophen 500 MG tablet   Commonly known as: TYLENOL   Take 500 mg by mouth every 6 (six) hours as needed. For pain.      amitriptyline 25 MG tablet   Commonly known as: ELAVIL   Take 1 tablet (25 mg total) by mouth at bedtime. Use to treat diabetic neuropathy      amLODipine 5 MG tablet   Commonly known as: NORVASC   Take 5 mg by mouth daily.      aspirin 81 MG tablet   Take 81 mg by mouth daily.      Calcium 600+D 600-200 MG-UNIT Tabs   Generic drug: Calcium Carbonate-Vitamin D   Take 1 tablet by mouth daily.      DEXILANT 60 MG capsule   Generic drug: dexlansoprazole   Take 60 mg by mouth daily.      hydrochlorothiazide 25 MG tablet   Commonly known as: HYDRODIURIL   Take 0.5 tablets (12.5 mg total) by mouth daily. Decrease dose to 1/2 tablet daily      metFORMIN 500 MG tablet   Commonly known as: GLUCOPHAGE   Take 500 mg by mouth 2 (two) times daily.      Polyethyl Glycol-Propyl Glycol 0.4-0.3 % Soln   Place 1 drop into both eyes 2 (two) times daily.      potassium gluconate 595 MG Tabs   Take 595 mg by mouth daily.      pravastatin 20 MG tablet   Commonly known as: PRAVACHOL   Take 20 mg by mouth daily.      VENTOLIN HFA 108 (90 BASE) MCG/ACT inhaler   Generic drug: albuterol   Inhale 2 puffs into the lungs every 6 (six) hours as needed. For shortness of breath.      verapamil 80 MG tablet   Commonly known as: CALAN   Take 80 mg by  mouth 2 (two) times daily.             Disposition and Follow-up: The patient is being discharged home.  She is instructed to follow up with Paula [name] PCP in 1 week.  Significant Diagnostic Studies:   Ct Head Wo Contrast 10/10/2011  IMPRESSION:  1.  No acute intracranial abnormalities. 2.  Small vessel ischemic change and brain atrophy.  Original Report Authenticated By: Angelita Ingles, M.D.    2 D Echo 10/11/10: Study Conclusions - Left ventricle: The cavity size was normal. Systolic function was normal. The estimated ejection fraction was in the range of 60% to 65%. Wall motion was normal; there were no regional wall motion abnormalities. Features are consistent with a pseudonormal left ventricular filling pattern, with concomitant abnormal relaxation and increased filling pressure (grade 2 diastolic dysfunction). - Aortic valve: Trivial regurgitation. - Mitral valve: Calcified annulus.   Discharge Laboratory Values: Basic Metabolic Panel:  Lab [id number] 10/10/11 2110 10/10/11 1541 10/10/11 1329  NA 142 -- 138 137  K 3.6 -- 3.4* --  CL 108 -- 103 99  CO2  21 -- 21 28  GLUCOSE 111* -- 89 138*  BUN 17 -- 23 25*  CREATININE 1.04 -- 1.11* 1.32*  CALCIUM 9.0 -- 8.7 9.4  MG -- 1.5 -- --  PHOS -- 3.0 -- --   GFR Estimated Creatinine Clearance: 51.4 ml/min (by C-G formula based on Cr of 1.04). Liver Function Tests:  Lab 10/11/11 0550  AST 19  ALT 18  ALKPHOS 54  BILITOT 0.3  PROT 6.6  ALBUMIN 3.2*   Coagulation profile  Lab 10/10/11 2110 10/10/11 1610  INR 1.14 1.08  PROTIME -- --    CBC:  Lab 10/11/11 0550 10/10/11 1728 10/10/11 1329  WBC 6.3 10.0 10.4  NEUTROABS 4.2 -- --  HGB 11.8* 12.7 12.5  HCT 33.5* 36.5 35.6*  MCV 80.7 81.5 80.5  PLT 187 239 224   Cardiac Enzymes:  Lab 10/11/11 1355 10/11/11 0550 10/10/11 2248 10/10/11 1540 10/10/11 1329  CKTOTAL 144 130 112 -- --  CKMB 3.3 3.0 2.4 -- --  CKMBINDEX -- -- -- -- --  TROPONINI <0.30 <0.30 <0.30 <0.30  <0.30   CBG:  Lab 10/11/11 1117 10/11/11 0718 10/10/11 2312 10/10/11 1827 10/10/11 1320  GLUCAP 144* 107* 114* 72 114*   Hgb A1c  Basename 10/10/11 2110  HGBA1C 6.1*   Thyroid function studies  Basename 10/10/11 2110  TSH 1.725  T4TOTAL --  T3FREE --  THYROIDAB --    Brief H and P: For complete details please refer to admission H and P, but in brief, Paula [name] is a 66 year old female with history of HTN, DM, dyslipidemia, acute on chronic kidney disease who presented to ED with complaints of dizziness, shortness of breath (on exertion) and left sided chest pain, 5-6/10 in intensity, non radiating and started at rest. 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    Physical Exam at Discharge: BP 115/65  Pulse 67  Temp(Src) 98.2 F (36.8 C) (Oral)  Resp 20  Ht 5\' 4"  (1.626 m)  Wt 69 kg (152 lb 1.9 oz)  BMI 26.11 kg/m2  SpO2 94% Gen:  NAD Cardiovascular:  RRR, No M/R/G Respiratory: Lungs CTAB Gastrointestinal: Abdomen soft, NT/ND with normal active bowel sounds. Extremities: No C/E/C   Hospital Course:  Principal Problem:  *Dizziness Likely from orthostasis in the setting of dehydration, AKI and known peripheral neuropathy.  She was gently hydrated with improvement in Paula [name] creatinine.  She will be discharged on a lower dose of HCTZ with instructions to follow up with Paula [name] PCP for a blood pressure check and further medication adjustment if indicated.  CT of the head, 2 D Echo, thyroid function all unremarkable. Active Problems:  DIABETES MELLITUS, TYPE II Hemoglobin A1c indicates good out patient control.  HYPERLIPIDEMIA Maintained on Zocor.  HYPERTENSION BP slightly low on admission.  Hydrated.  Will d/c on lower dose of HCTZ.  Hypokalemia Likely related to diuretics.  Replaced.  Acute on chronic kidney disease, stage 3 Creatinine elevated on admission.  Baseline creatinine 0.95.  Hydrated with return of  Paula [name] creatinine to baseline.  Neuropathy Put on Neurontin, but cost constraints will preclude continuing this at discharge.  Will start low dose Amitriptyline for pain control.   Diastolic CHF, chronic Pro-BNP not elevated and no evidence of decompensated heart failure.    Recommendations for hospital follow-up: 1. F/U BP check in 1 week. 2. Adjust Amitriptyline dose for neuropathy control.  Diet:  Heart healthy, carbohydrate modified.  Activity:  Increase activity slowly  Condition at Discharge:   Stable  Time spent on Discharge:  35 minutes.  Signed: Dr. Margreta Journey Tamasha Laplante Pager [phone] 10/11/2011, 4:41 PM

## 2011-11-04 ENCOUNTER — Ambulatory Visit (INDEPENDENT_AMBULATORY_CARE_PROVIDER_SITE_OTHER): Payer: Medicaid Other | Admitting: Critical Care Medicine

## 2011-11-04 ENCOUNTER — Encounter: Payer: Self-pay | Admitting: Critical Care Medicine

## 2011-11-04 VITALS — BP 126/62 | HR 82 | Temp 97.8°F | Ht 63.0 in | Wt 154.8 lb

## 2011-11-04 DIAGNOSIS — R05 Cough: Secondary | ICD-10-CM

## 2011-11-04 DIAGNOSIS — R059 Cough, unspecified: Secondary | ICD-10-CM | POA: Insufficient documentation

## 2011-11-04 MED ORDER — MOMETASONE FUROATE 50 MCG/ACT NA SUSP: 2.0000 | Freq: Every day | NASAL | Status: DC

## 2011-11-04 MED ORDER — TRAMADOL HCL 50 MG PO TABS: ORAL_TABLET | ORAL | Status: AC

## 2011-11-04 MED ORDER — BENZONATATE 100 MG PO CAPS: ORAL_CAPSULE | ORAL | Status: AC

## 2011-11-04 MED ORDER — CHLORPHENIRAMINE TANNATE 12 MG PO TABS: ORAL_TABLET | ORAL | Status: DC

## 2011-11-04 NOTE — Assessment & Plan Note (Addendum)
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

## 2011-11-04 NOTE — Progress Notes (Signed)
Subjective:    Patient ID: Paula [name], female    DOB: [birth year], 66 y.o.   MRN: [id number]  HPI  HPI Comments: Cough since 1993.  Coughing seasonally , but now coughing constantly, phlegm into nose and mouth. Saw ENT and was neg w/u  Cough This is a chronic problem. The current episode started more than 1 year ago. The problem has been gradually worsening. The problem occurs constantly. The cough is productive of sputum (mucus is clear , no blood). Associated symptoms include ear congestion, ear pain, headaches, heartburn, nasal congestion, postnasal drip, rhinorrhea, a sore throat, shortness of breath and wheezing. Pertinent negatives include no chest pain, chills, fever, hemoptysis, myalgias or rash. Associated symptoms comments: Has a dry scratchy throat If eats food will regurgitate . The symptoms are aggravated by stress, dust, fumes, pollens, lying down and exercise (allergy to mold and dust, coughs all night long). She has tried steroid inhaler and a beta-agonist inhaler for the symptoms. The treatment provided no relief. Her past medical history is significant for environmental allergies. There is no history of asthma, bronchitis, COPD, emphysema or pneumonia.   04/24/2011 ER follow up  Pt returns for follow up from ER visit. On 04/21/11. Seen in ER for chest pain, dyspnea and weakness on 9/10. Told she had severe reflux. Was unable to take reglan given last ov due to side effects. She   CXR in ER with no acute findings. Since ER she is feeling better., taking dexilant which is helping.  Of note with med review , she is on verapamil and norvasc  Per PCP .  Does not have money , medicaid does not cover certain meds or otc meds.  NO discolored mucus or fever. Some throat drainage at times.   9/25 Pt first seen end 8/12 and we rec: Stop omeprazole  Start Dexilant one daily  Start Reglan one 4 times a day with meals and at bedtime  Stop Qvar  Use cyclic cough protocol  At Ov  with NP 9/13 we rec: GERD diet  Dexilant 60mg  daily -before meal  Avoid all mint products  Use sugarless candy, ice chips, water to help coughing and throat clearing.  Stop reglan .  Delsym 2 tsp every 12 hrs for cough (for cough ) otc  Tramadol 50mg  every 4 hr As needed Breakthrough cough  Chlor -Trimeton 4mg  2 At bedtime (for drainage ) otc  Pepcid 20mg  At bedtime (for reflux) otc   Not as bad as before.   No real heartburn.  Chest will squeeze.   Cough remains dry occ some phlegm.  Not dyspneic.   11/12 At last ov we rec: Stop pepcid after your current supply runs out Stay on dexilant daily Use Delsym as needed Use chlorpheniramine at bedtime Keep sugar free jolly ranchers in mouth to train yourself to swallow not cough or clear throat  Out of dexilant.  Notices a difference, food refluxes less.  Cough is less. Not as frequent.   11/04/2011 Pt started coughing again three weeks ago after d/c from hosp.  Cough is dry, occ prod clear mucus. No heartburn, occ belching and burping with gas.  Throat is irritated.  Occ clear of throat.  Throat is dry and scratchy.  Not dyspneic unless is coughing.  No chest pains.  Notes some sneezing and itching and pndrip.    Past Medical History  Diagnosis Date  . Allergic rhinitis   . Cough   . GERD (gastroesophageal reflux disease)   .  Diabetes mellitus   . Hypertension   . Hyperlipidemia   . History of colon polyps 2012    tubular adenoma   . Arthritis   . PONV (postoperative nausea and vomiting)     trouble waking up  . Diastolic CHF, chronic 123XX123  . Neuropathy 10/11/2011     Family History  Problem Relation Age of Onset  . Asthma Sister   . Colon cancer Neg Hx   . Hypertension Mother      History   Social History  . Marital Status: Widowed    Spouse Name: N/A    Number of Children: 4  . Years of Education: N/A   Occupational History  . Unemployed    Social History Main Topics  . Smoking status: Never Smoker     . Smokeless tobacco: Never Used  . Alcohol Use: No  . Drug Use: No  . Sexually Active: Not Currently    Birth Control/ Protection: None   Other Topics Concern  . Not on file   Social History Narrative  . No narrative on file     Allergies  Allergen Reactions  . Ace Inhibitors     REACTION: chronic cough     Outpatient Prescriptions Prior to Visit  Medication Sig Dispense Refill  . acetaminophen (TYLENOL) 500 MG tablet Take 500 mg by mouth every 6 (six) hours as needed. For pain.      Marland Kitchen albuterol (VENTOLIN HFA) 108 (90 BASE) MCG/ACT inhaler Inhale 2 puffs into the lungs every 6 (six) hours as needed. For shortness of breath.      Marland Kitchen amLODipine (NORVASC) 5 MG tablet Take 5 mg by mouth daily.        Marland Kitchen aspirin 81 MG tablet Take 81 mg by mouth daily.        . Calcium Carbonate-Vitamin D (CALCIUM 600+D) 600-200 MG-UNIT TABS Take 1 tablet by mouth daily.        Marland Kitchen dexlansoprazole (DEXILANT) 60 MG capsule Take 60 mg by mouth daily.       . hydrochlorothiazide (HYDRODIURIL) 25 MG tablet Take 0.5 tablets (12.5 mg total) by mouth daily. Decrease dose to 1/2 tablet daily      . metFORMIN (GLUCOPHAGE) 500 MG tablet Take 500 mg by mouth 2 (two) times daily.        Vladimir Faster Glycol-Propyl Glycol 0.4-0.3 % SOLN Place 1 drop into both eyes 2 (two) times daily.       . potassium gluconate 595 MG TABS Take 595 mg by mouth daily.      . pravastatin (PRAVACHOL) 20 MG tablet Take 20 mg by mouth daily.      . verapamil (CALAN) 80 MG tablet Take 80 mg by mouth 2 (two) times daily.       Marland Kitchen amitriptyline (ELAVIL) 25 MG tablet Take 1 tablet (25 mg total) by mouth at bedtime. Use to treat diabetic neuropathy  30 tablet  2     Review of Systems Constitutional:   No  weight loss, night sweats,  Fevers, chills, fatigue, lassitude. HEENT:   No headaches,  Difficulty swallowing,  Tooth/dental problems,  Sore throat,                No sneezing, itching, ear ache, nasal congestion, post nasal drip,   CV:   No chest pain,  Orthopnea, PND, swelling in lower extremities, anasarca, dizziness, palpitations  GI  No heartburn, indigestion, abdominal pain, nausea, vomiting, diarrhea, change in bowel habits, loss of appetite  Resp: No shortness of breath with exertion or at rest.  No excess mucus, no productive cough,  No non-productive cough,  No coughing up of blood.  No change in color of mucus.  No wheezing.  No chest wall deformity  Skin: no rash or lesions.  GU: no dysuria, change in color of urine, no urgency or frequency.  No flank pain.  MS:  No joint pain or swelling.  No decreased range of motion.  No back pain.  Psych:  No change in mood or affect. No depression or anxiety.  No memory loss.     Objective:   Physical Exam  Filed Vitals:   11/04/11 1054  BP: 126/62  Pulse: 82  Temp: 97.8 F (36.6 C)  TempSrc: Oral  Height: 5\' 3"  (1.6 m)  Weight: 154 lb 12.8 oz (70.217 kg)  SpO2: 96%    Gen: Pleasant, well-nourished, in no distress,  normal affect  ENT: No lesions,  mouth clear,  oropharynx clear, no postnasal drip  Neck: No JVD, no TMG, no carotid bruits  Lungs: No use of accessory muscles, no dullness to percussion, clear without rales or rhonchi [place]  Cardiovascular: RRR, heart sounds normal, no murmur or gallops, no peripheral edema  Abdomen: soft and NT, no HSM,  BS normal  Musculoskeletal: No deformities, no cyanosis or clubbing  Neuro: alert, non focal  Skin: Warm, no lesions or rashes    CXR 04/21/11  NAD Arlyce Harman 04/08/11: normal CT sinus 6/12: normal     Assessment & Plan:   Cough 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 acetaminophen (TYLENOL) 500 MG tablet Take 500 mg by mouth every 6 (six) hours as needed. For pain.      Marland Kitchen albuterol (VENTOLIN HFA) 108 (90 BASE) MCG/ACT inhaler Inhale 2 puffs into the lungs every 6 (six) hours as needed. For shortness of breath.      Marland Kitchen amitriptyline (ELAVIL) 25 MG tablet Take 12.5 mg by mouth at bedtime. Use to treat diabetic neuropathy      . amLODipine (NORVASC) 5 MG tablet Take 5 mg by mouth daily.        Marland Kitchen aspirin 81 MG tablet Take 81 mg by mouth daily.        . Calcium Carbonate-Vitamin D (CALCIUM 600+D) 600-200 MG-UNIT TABS Take 1 tablet by mouth daily.        Marland Kitchen dexlansoprazole (DEXILANT) 60 MG capsule Take 60 mg by mouth daily.       . hydrochlorothiazide (HYDRODIURIL) 25 MG tablet Take 0.5 tablets (12.5 mg total) by mouth daily. Decrease dose to 1/2 tablet daily      . metFORMIN (GLUCOPHAGE) 500 MG tablet Take 500 mg by mouth 2 (two) times daily.        Vladimir Faster Glycol-Propyl Glycol 0.4-0.3 % SOLN Place 1 drop into both eyes 2 (two) times daily.       . potassium gluconate 595 MG TABS Take 595 mg by mouth daily.      . pravastatin (PRAVACHOL) 20 MG tablet Take 20 mg by  mouth daily.      . verapamil (CALAN) 80 MG tablet Take 80 mg by mouth 2 (two) times daily.       Marland Kitchen DISCONTD: amitriptyline (ELAVIL) 25 MG tablet Take 1 tablet (25 mg total) by mouth at bedtime. Use to treat diabetic neuropathy  30 tablet  2  . benzonatate (TESSALON) 100 MG capsule Take 1 - 2 every 4-6 hours as needed for cough  60 capsule  4  . Chlorpheniramine Tannate 12 MG TABS One twice daily for 10days  30 each  6  . mometasone (NASONEX) 50 MCG/ACT nasal spray Place 2 sprays into the nose daily.  17 g  0  . traMADol (ULTRAM) 50 MG tablet Take per cyclic cough protocol  1-2 every 4-6 hours as needed  30 tablet  0

## 2011-11-04 NOTE — Patient Instructions (Signed)
Use tramadol and tessalon for cough protocol Start chlorpheniramine 12mg  twice daily for 10days Use nasonex two puff each nostril daily until sample gone Return 2 weeks for recheck with Tammy Parrett

## 2011-11-17 ENCOUNTER — Ambulatory Visit (INDEPENDENT_AMBULATORY_CARE_PROVIDER_SITE_OTHER): Payer: Medicare Other | Admitting: Adult Health

## 2011-11-17 ENCOUNTER — Encounter: Payer: Self-pay | Admitting: Adult Health

## 2011-11-17 VITALS — BP 118/72 | HR 83 | Temp 96.9°F | Ht 63.0 in | Wt 153.8 lb

## 2011-11-17 DIAGNOSIS — R05 Cough: Secondary | ICD-10-CM

## 2011-11-17 DIAGNOSIS — R059 Cough, unspecified: Secondary | ICD-10-CM

## 2011-11-17 MED ORDER — TRAMADOL HCL 50 MG PO TABS
50.0000 mg | ORAL_TABLET | Freq: Four times a day (QID) | ORAL | Status: AC | PRN
Start: 2011-11-17 — End: 2011-11-27

## 2011-11-17 NOTE — Progress Notes (Signed)
Subjective:    Patient ID: Paula [name], female    DOB: [birth year], 66 y.o.   MRN: [id number]  HPI   HPI Comments: Cough since 1993.  Coughing seasonally , but now coughing constantly, phlegm into nose and mouth. Saw ENT and was neg w/u  Cough This is a chronic problem. The current episode started more than 1 year ago. The problem has been gradually worsening. The problem occurs constantly. The cough is productive of sputum (mucus is clear , no blood). Associated symptoms include ear congestion, ear pain, headaches, heartburn, nasal congestion, postnasal drip, rhinorrhea, a sore throat, shortness of breath and wheezing. Pertinent negatives include no chest pain, chills, fever, hemoptysis, myalgias or rash. Associated symptoms comments: Has a dry scratchy throat If eats food will regurgitate . The symptoms are aggravated by stress, dust, fumes, pollens, lying down and exercise (allergy to mold and dust, coughs all night long). She has tried steroid inhaler and a beta-agonist inhaler for the symptoms. The treatment provided no relief. Her past medical history is significant for environmental allergies. There is no history of asthma, bronchitis, COPD, emphysema or pneumonia.   04/24/2011 ER follow up  Pt returns for follow up from ER visit. On 04/21/11. Seen in ER for chest pain, dyspnea and weakness on 9/10. Told she had severe reflux. Was unable to take reglan given last ov due to side effects. She   CXR in ER with no acute findings. Since ER she is feeling better., taking dexilant which is helping.  Of note with med review , she is on verapamil and norvasc  Per PCP .  Does not have money , medicaid does not cover certain meds or otc meds.  NO discolored mucus or fever. Some throat drainage at times.   9/25 Pt first seen end 8/12 and we rec: Stop omeprazole  Start Dexilant one daily  Start Reglan one 4 times a day with meals and at bedtime  Stop Qvar  Use cyclic cough protocol  At Ov  with NP 9/13 we rec: GERD diet  Dexilant 60mg  daily -before meal  Avoid all mint products  Use sugarless candy, ice chips, water to help coughing and throat clearing.  Stop reglan .  Delsym 2 tsp every 12 hrs for cough (for cough ) otc  Tramadol 50mg  every 4 hr As needed Breakthrough cough  Chlor -Trimeton 4mg  2 At bedtime (for drainage ) otc  Pepcid 20mg  At bedtime (for reflux) otc   Not as bad as before.   No real heartburn.  Chest will squeeze.   Cough remains dry occ some phlegm.  Not dyspneic.   11/12 At last ov we rec: Stop pepcid after your current supply runs out Stay on dexilant daily Use Delsym as needed Use chlorpheniramine at bedtime Keep sugar free jolly ranchers in mouth to train yourself to swallow not cough or clear throat  Out of dexilant.  Notices a difference, food refluxes less.  Cough is less. Not as frequent.   11/04/11  Pt started coughing again three weeks ago after d/c from hosp.  Cough is dry, occ prod clear mucus. No heartburn, occ belching and burping with gas.  Throat is irritated.  Occ clear of throat.  Throat is dry and scratchy.  Not dyspneic unless is coughing.  No chest pains.  Notes some sneezing and itching and pndrip.   >>cyclical cough rx    [id number]  Follow up  2 week follow up cough. Says  SOB and wheezing  have improved but the cough returned after finishing the tramadol. She had flare of cough last ov , tx with cyclical cough regimen with tessalon and tramadol. Unable to take tessalon. Complains of sleepiness with chlor tabs.  Has run out of Tramadol, needs refill.  No chest pain or fever . No discolored mucus. Cough is dry.        Past Medical History  Diagnosis Date  . Allergic rhinitis   . Cough   . GERD (gastroesophageal reflux disease)   . Diabetes mellitus   . Hypertension   . Hyperlipidemia   . History of colon polyps 2012    tubular adenoma   . Arthritis   . PONV (postoperative nausea and vomiting)     trouble  waking up  . Diastolic CHF, chronic 123XX123  . Neuropathy 10/11/2011     Family History  Problem Relation Age of Onset  . Asthma Sister   . Colon cancer Neg Hx   . Hypertension Mother      History   Social History  . Marital Status: Widowed    Spouse Name: N/A    Number of Children: 4  . Years of Education: N/A   Occupational History  . Unemployed    Social History Main Topics  . Smoking status: Never Smoker   . Smokeless tobacco: Never Used  . Alcohol Use: No  . Drug Use: No  . Sexually Active: Not Currently    Birth Control/ Protection: None   Other Topics Concern  . Not on file   Social History Narrative  . No narrative on file     Allergies  Allergen Reactions  . Ace Inhibitors     REACTION: chronic cough     Outpatient Prescriptions Prior to Visit  Medication Sig Dispense Refill  . acetaminophen (TYLENOL) 500 MG tablet Take 500 mg by mouth every 6 (six) hours as needed. For pain.      Marland Kitchen albuterol (VENTOLIN HFA) 108 (90 BASE) MCG/ACT inhaler Inhale 2 puffs into the lungs every 6 (six) hours as needed. For shortness of breath.      Marland Kitchen amitriptyline (ELAVIL) 25 MG tablet Take 12.5 mg by mouth at bedtime. Use to treat diabetic neuropathy      . amLODipine (NORVASC) 5 MG tablet Take 5 mg by mouth daily.        Marland Kitchen aspirin 81 MG tablet Take 81 mg by mouth daily.        . Calcium Carbonate-Vitamin D (CALCIUM 600+D) 600-200 MG-UNIT TABS Take 1 tablet by mouth daily.        . Chlorpheniramine Tannate 12 MG TABS One twice daily for 10days  30 each  6  . metFORMIN (GLUCOPHAGE) 500 MG tablet Take 500 mg by mouth 2 (two) times daily.        . mometasone (NASONEX) 50 MCG/ACT nasal spray Place 2 sprays into the nose daily.  17 g  0  . Polyethyl Glycol-Propyl Glycol 0.4-0.3 % SOLN Place 1 drop into both eyes 2 (two) times daily.       . potassium gluconate 595 MG TABS Take 595 mg by mouth daily.      . pravastatin (PRAVACHOL) 20 MG tablet Take 20 mg by mouth daily.      .  verapamil (CALAN) 80 MG tablet Take 80 mg by mouth 2 (two) times daily.       Marland Kitchen dexlansoprazole (DEXILANT) 60 MG capsule Take 1 capsule (60 mg total) by mouth daily.  [place]  capsule  0  . hydrochlorothiazide (HYDRODIURIL) 25 MG tablet Take 0.5 tablets (12.5 mg total) by mouth daily. Decrease dose to 1/2 tablet daily      . dexlansoprazole (DEXILANT) 60 MG capsule Take 1 capsule (60 mg total) by mouth daily.  30 capsule  6  . dexlansoprazole (DEXILANT) 60 MG capsule Take 60 mg by mouth daily.          Review of Systems  Constitutional:   No  weight loss, night sweats,  Fevers, chills +, fatigue, lassitude. HEENT:   No headaches,  Difficulty swallowing,  Tooth/dental problems,  Sore throat,                No sneezing, itching, ear ache,  +nasal congestion, post nasal drip,   CV:  No chest pain,  Orthopnea, PND, swelling in lower extremities, anasarca, dizziness, palpitations  GI  No heartburn, indigestion, abdominal pain, nausea, vomiting, diarrhea, change in bowel habits, loss of appetite   RESP: No chest wall deformity  Skin: no rash or lesions.  GU: no dysuria, change in color of urine, no urgency or frequency.  No flank pain.  MS:  No joint pain or swelling.  No decreased range of motion.  No back pain.  Psych:  No change in mood or affect. No depression or anxiety.  No memory loss.     Objective:   Physical Exam  Filed Vitals:   11/17/11 1035  BP: 118/72  Pulse: 83  Temp: 96.9 F (36.1 C)  TempSrc: Oral  Height: 5\' 3"  (1.6 m)  Weight: 153 lb 12.8 oz (69.763 kg)  SpO2: 97%    Gen: Pleasant, well-nourished, in no distress,  normal affect  ENT: No lesions,  mouth clear,  oropharynx clear, no postnasal drip  Neck: No JVD, no TMG, no carotid bruits  Lungs: No use of accessory muscles, no dullness to percussion, clear without rales or rhonchi    Cardiovascular: RRR, heart sounds normal, no murmur or gallops, no peripheral edema  Abdomen: soft and NT, no HSM,  BS  normal  Musculoskeletal: No deformities, no cyanosis or clubbing  Neuro: alert, non focal  Skin: Warm, no lesions or rashes    CXR 04/21/11  NAD Arlyce Harman 04/08/11: normal CT sinus 6/12: normal     Assessment & Plan:   No problem-specific assessment & plan notes found for this encounter.   Updated Medication List Outpatient Encounter Prescriptions as of 11/17/2011  Medication Sig Dispense Refill  . acetaminophen (TYLENOL) 500 MG tablet Take 500 mg by mouth every 6 (six) hours as needed. For pain.      Marland Kitchen albuterol (VENTOLIN HFA) 108 (90 BASE) MCG/ACT inhaler Inhale 2 puffs into the lungs every 6 (six) hours as needed. For shortness of breath.      Marland Kitchen amitriptyline (ELAVIL) 25 MG tablet Take 12.5 mg by mouth at bedtime. Use to treat diabetic neuropathy      . amLODipine (NORVASC) 5 MG tablet Take 5 mg by mouth daily.        Marland Kitchen aspirin 81 MG tablet Take 81 mg by mouth daily.        . Calcium Carbonate-Vitamin D (CALCIUM 600+D) 600-200 MG-UNIT TABS Take 1 tablet by mouth daily.        . Chlorpheniramine Tannate 12 MG TABS One twice daily for 10days  30 each  6  . dexlansoprazole (DEXILANT) 60 MG capsule Take 60 mg by mouth daily.      . hydrochlorothiazide (HYDRODIURIL) 25 MG  tablet Decrease dose to 1/2 tablet daily      . metFORMIN (GLUCOPHAGE) 500 MG tablet Take 500 mg by mouth 2 (two) times daily.        . mometasone (NASONEX) 50 MCG/ACT nasal spray Place 2 sprays into the nose daily.  17 g  0  . Polyethyl Glycol-Propyl Glycol 0.4-0.3 % SOLN Place 1 drop into both eyes 2 (two) times daily.       . potassium gluconate 595 MG TABS Take 595 mg by mouth daily.      . pravastatin (PRAVACHOL) 20 MG tablet Take 20 mg by mouth daily.      . verapamil (CALAN) 80 MG tablet Take 80 mg by mouth 2 (two) times daily.       Marland Kitchen DISCONTD: dexlansoprazole (DEXILANT) 60 MG capsule Take 1 capsule (60 mg total) by mouth daily.  20 capsule  0  . DISCONTD: hydrochlorothiazide (HYDRODIURIL) 25 MG tablet Take 0.5  tablets (12.5 mg total) by mouth daily. Decrease dose to 1/2 tablet daily      . DISCONTD: dexlansoprazole (DEXILANT) 60 MG capsule Take 1 capsule (60 mg total) by mouth daily.  30 capsule  6  . DISCONTD: dexlansoprazole (DEXILANT) 60 MG capsule Take 60 mg by mouth daily.

## 2011-11-17 NOTE — Assessment & Plan Note (Signed)
Mild flare   Plan:  GERD diet  Continue on Dexilant 60mg  daily -before meal  Avoid all mint products  Use sugarless candy, ice chips, water to help coughing and throat clearing.  Add Delsym 2 tsp every 12 hrs for cough (for cough ) --this is over the counter.  May continue Tramadol 50mg  every 4 hr As needed  Breakthrough cough  Decrease Chlorphenaramine 12mg  1/2 in am and whole at bedtime.  follow up Dr. Joya Gaskins  In 6 weeks and As needed

## 2011-11-17 NOTE — Patient Instructions (Addendum)
GERD diet  Continue on Dexilant 60mg  daily -before meal  Avoid all mint products  Use sugarless candy, ice chips, water to help coughing and throat clearing.  Add Delsym 2 tsp every 12 hrs for cough (for cough ) --this is over the counter.  May continue Tramadol 50mg  every 4 hr As needed  Breakthrough cough  Decrease Chlorphenaramine 12mg  1/2 in am and whole at bedtime.  follow up Dr. Joya Gaskins  In 6 weeks and As needed

## 2011-12-03 ENCOUNTER — Ambulatory Visit (INDEPENDENT_AMBULATORY_CARE_PROVIDER_SITE_OTHER): Payer: Medicare Other | Admitting: Adult Health

## 2011-12-03 ENCOUNTER — Ambulatory Visit: Payer: Medicare Other | Admitting: Adult Health

## 2011-12-03 DIAGNOSIS — I10 Essential (primary) hypertension: Secondary | ICD-10-CM

## 2011-12-04 ENCOUNTER — Telehealth: Payer: Self-pay | Admitting: Critical Care Medicine

## 2011-12-04 NOTE — Telephone Encounter (Signed)
Pt was scheduled for appt with TP to have patient assistance forms filled out > ov was not necessary, so appt cancelled.    Med assistance forms for dexilant 60mg  once daily filled out - pt had started the forms with PEW's name as the provider.  PEW not in office until next week.  Pt is aware.  Message forwarded to PEW, forms placed in look at folder.  Pt requests call when forms are signed by PEW.  Copy will need to be made and originals mailed back to patient to be finished and sent to manufacturer.  Thanks.

## 2011-12-05 NOTE — Progress Notes (Signed)
Not seen  Canceled ov per pt

## 2011-12-05 NOTE — Assessment & Plan Note (Signed)
Canceled ov

## 2011-12-10 NOTE — Telephone Encounter (Signed)
Form has been signed by Dr. Joya Gaskins.  I called, spoke with pt and informed her of this.  She still needs to sign both forms.  She would like these mailed to her home address, not the [address].  I did verify her home address.  Forms placed in mail and pt aware.  Note: copy made of forms and placed in scan folder.  Also, I do not see mention where an actual rx is needed from Dr. Joya Gaskins.  Looks like this is only needed from [place] and [place].

## 2011-12-22 ENCOUNTER — Telehealth: Payer: Self-pay | Admitting: Critical Care Medicine

## 2011-12-22 NOTE — Telephone Encounter (Signed)
Spoke with patient-I offered her an appt with TP sooner than 01-07-12(appt with PW); pt declined and stated she only wants to see PW!!! She stated she will go to ER if things worsen, otherwise she will be here on 01-07-12 to see PW.

## 2011-12-24 ENCOUNTER — Telehealth: Payer: Self-pay | Admitting: Critical Care Medicine

## 2011-12-24 NOTE — Telephone Encounter (Signed)
[facility] PT line busy x 3 wcb 

## 2011-12-25 ENCOUNTER — Ambulatory Visit (INDEPENDENT_AMBULATORY_CARE_PROVIDER_SITE_OTHER): Payer: Medicare Other | Admitting: Pulmonary Disease

## 2011-12-25 ENCOUNTER — Ambulatory Visit (INDEPENDENT_AMBULATORY_CARE_PROVIDER_SITE_OTHER)
Admission: RE | Admit: 2011-12-25 | Discharge: 2011-12-25 | Disposition: A | Discharge status: Home / Self Care | Payer: Medicare Other | Source: Ambulatory Visit | Attending: Pulmonary Disease | Admitting: Pulmonary Disease

## 2011-12-25 ENCOUNTER — Encounter: Payer: Self-pay | Admitting: Pulmonary Disease

## 2011-12-25 VITALS — BP 122/76 | HR 81 | Temp 98.4°F | Ht 63.0 in | Wt 155.0 lb

## 2011-12-25 DIAGNOSIS — R059 Cough, unspecified: Secondary | ICD-10-CM

## 2011-12-25 DIAGNOSIS — R05 Cough: Secondary | ICD-10-CM

## 2011-12-25 NOTE — Patient Instructions (Addendum)
Likely musculoskeletal pain Tylenol extra strength or motrin after food for pain Chest xray nasonex sample Stay on dexilant

## 2011-12-25 NOTE — Progress Notes (Signed)
  Subjective:    Patient ID: Paula [name], female    DOB: 1945-11-25, 66 y.o.   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  Saw ENT and was neg w/u Improved with GERD therapy.  4/13 Follow up  2 week follow up cough. Says  SOB and wheezing have improved but the cough returned after finishing the tramadol. She had flare of cough last ov , tx with cyclical cough regimen with tessalon and tramadol. Unable to take tessalon. Complains of sleepiness with chlor tabs.  Has run out of Tramadol, needs refill.  No chest pain or fever . No discolored mucus. Cough is dry >> Continue on Dexilant 60mg  daily -before meal  Avoid all mint products  Use sugarless candy, ice chips, water to help coughing and throat clearing.  Add Delsym 2 tsp every 12 hrs for cough (for cough ) --this is over the counter.  May continue Tramadol 50mg  every 4 hr As needed Breakthrough cough  Decrease Chlorphenaramine 12mg  1/2 in am and whole at bedtime.    12/25/2011  .  PW pt. Pt c/o when taking deep breathe her back hurts and hurts when she coughs. today is some better. pt c/o cough w/ clear phlem CXr 9/12 wnl She reports remote injury in the 90's while in [place]     Review of Systems  Constitutional:   No  weight loss, night sweats,  Fevers, chills +, fatigue, lassitude. HEENT:   No headaches,  Difficulty swallowing,  Tooth/dental problems,  Sore throat,                No sneezing, itching, ear ache,  +nasal congestion, post nasal drip,   CV:  No chest pain,  Orthopnea, PND, swelling in lower extremities, anasarca, dizziness, palpitations  GI  No heartburn, indigestion, abdominal pain, nausea, vomiting, diarrhea, change in bowel habits, loss of appetite   RESP: No chest wall deformity  Skin: no rash or lesions.  GU: no dysuria, change in color of urine, no urgency or frequency.  No flank pain.  MS:  No joint  pain or swelling.  No decreased range of motion.  No back pain.  Psych:  No change in mood or affect. No depression or anxiety.  No memory loss.     Objective:   Physical Exam  Filed Vitals:   12/25/11 1555 12/25/11 1557  BP:  122/76  Pulse:  81  Temp: 98.4 F (36.9 C)   TempSrc: Oral   Height: 5\' 3"  (1.6 m)   Weight: 155 lb (70.308 kg)   SpO2:  98%    Gen: Pleasant, well-nourished, in no distress,  normal affect  ENT: No lesions,  mouth clear,  oropharynx clear, no postnasal drip  Neck: No JVD, no TMG, no carotid bruits  Lungs: No use of accessory muscles, no dullness to percussion, clear without rales or rhonchi    Cardiovascular: RRR, heart sounds normal, no murmur or gallops, no peripheral edema  Abdomen: soft and NT, no HSM,  BS normal  Musculoskeletal: No deformities, no cyanosis or clubbing  Neuro: alert, non focal  Skin: Warm, no lesions or rashes    CXR 04/21/11  NAD Arlyce Harman 04/08/11: normal CT sinus 6/12: normal     Assessment & Plan:   No problem-specific assessment & plan notes found for this encounter.

## 2011-12-25 NOTE — Telephone Encounter (Signed)
Spoke with pt. She c/o pain in upper back and chest x several days with inspiration. She states feels the need to cough but can not due to pain. She states that her breathing also seems slightly worse since the last visit.

## 2011-12-25 NOTE — Assessment & Plan Note (Signed)
Likely musculoskeletal pain ? Related to cough Tylenol extra strength or motrin after food for pain Chest xray nasonex sample Stay on dexilant

## 2011-12-25 NOTE — Telephone Encounter (Signed)
OV with RA today at 4:15, advised ED sooner if worsens.

## 2012-01-07 ENCOUNTER — Ambulatory Visit (INDEPENDENT_AMBULATORY_CARE_PROVIDER_SITE_OTHER): Payer: Medicare Other | Admitting: Critical Care Medicine

## 2012-01-07 ENCOUNTER — Encounter: Payer: Self-pay | Admitting: Critical Care Medicine

## 2012-01-07 VITALS — BP 126/76 | HR 76 | Temp 97.9°F | Ht 63.0 in | Wt 155.4 lb

## 2012-01-07 DIAGNOSIS — R059 Cough, unspecified: Secondary | ICD-10-CM

## 2012-01-07 DIAGNOSIS — R05 Cough: Secondary | ICD-10-CM

## 2012-01-07 NOTE — Progress Notes (Signed)
Subjective:    Patient ID: Paula [name], female    DOB: 01-18-1946, 66 y.o.   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  Saw ENT and was neg w/u Improved with GERD therapy.    01/07/2012 Now has Medicare.  Is on Dexilant.  Cough is much better No real pndrip. Cough is better.    Pt denies any significant sore throat, nasal congestion or excess secretions, fever, chills, sweats, unintended weight loss, pleurtic or exertional chest pain, orthopnea PND, or leg swelling Pt denies any increase in rescue therapy over baseline, denies waking up needing it or having any early am or nocturnal exacerbations of coughing/wheezing/or dyspnea. Pt also denies any obvious fluctuation in symptoms with  weather or environmental change or other alleviating or aggravating factors    Past Medical History  Diagnosis Date  . Allergic rhinitis   . Cough   . GERD (gastroesophageal reflux disease)   . Diabetes mellitus   . Hypertension   . Hyperlipidemia   . History of colon polyps 2012    tubular adenoma   . Arthritis   . PONV (postoperative nausea and vomiting)     trouble waking up  . Diastolic CHF, chronic 123XX123  . Neuropathy 10/11/2011     Family History  Problem Relation Age of Onset  . Asthma Sister   . Colon cancer Neg Hx   . Hypertension Mother      History   Social History  . Marital Status: Widowed    Spouse Name: N/A    Number of Children: 4  . Years of Education: N/A   Occupational History  . Unemployed    Social History Main Topics  . Smoking status: Never Smoker   . Smokeless tobacco: Never Used  . Alcohol Use: No  . Drug Use: No  . Sexually Active: Not Currently    Birth Control/ Protection: None   Other Topics Concern  . Not on file   Social History Narrative  . No narrative on file     Allergies  Allergen Reactions  . Ace Inhibitors     REACTION:  chronic cough     Outpatient Prescriptions Prior to Visit  Medication Sig Dispense Refill  . acetaminophen (TYLENOL) 500 MG tablet Take 500 mg by mouth every 6 (six) hours as needed. For pain.      Marland Kitchen amitriptyline (ELAVIL) 25 MG tablet Take 12.5 mg by mouth at bedtime. Use to treat diabetic neuropathy      . amLODipine (NORVASC) 5 MG tablet Take 5 mg by mouth daily.        Marland Kitchen aspirin 81 MG tablet Take 81 mg by mouth daily.        . Calcium Carbonate-Vitamin D (CALCIUM 600+D) 600-200 MG-UNIT TABS Take 1 tablet by mouth daily.        Marland Kitchen dexlansoprazole (DEXILANT) 60 MG capsule Take 60 mg by mouth daily.      . hydrochlorothiazide (HYDRODIURIL) 25 MG tablet Decrease dose to 1/2 tablet daily      . metFORMIN (GLUCOPHAGE) 500 MG tablet Take 500 mg by mouth 2 (two) times daily.        Vladimir Faster Glycol-Propyl Glycol 0.4-0.3 % SOLN Place 1 drop into both eyes 2 (two) times daily.       . potassium gluconate 595 MG TABS Take 595 mg by mouth daily.      Marland Kitchen  pravastatin (PRAVACHOL) 20 MG tablet Take 20 mg by mouth daily.      . verapamil (CALAN) 80 MG tablet Take 80 mg by mouth 2 (two) times daily.       . mometasone (NASONEX) 50 MCG/ACT nasal spray Place 2 sprays into the nose daily.  17 g  0  . albuterol (VENTOLIN HFA) 108 (90 BASE) MCG/ACT inhaler Inhale 2 puffs into the lungs every 6 (six) hours as needed. For shortness of breath.      . Chlorpheniramine Tannate 12 MG TABS One twice daily for 10days  30 each  6       Review of Systems  Constitutional:   No  weight loss, night sweats,  Fevers, chills , fatigue, lassitude. HEENT:   No headaches,  Difficulty swallowing,  Tooth/dental problems,  Sore throat,                No sneezing, itching, ear ache,  No nasal congestion, post nasal drip,   CV:  No chest pain,  Orthopnea, PND, swelling in lower extremities, anasarca, dizziness, palpitations  GI  No heartburn, indigestion, abdominal pain, nausea, vomiting, diarrhea, change in bowel habits,  loss of appetite   RESP: No chest wall deformity  Skin: no rash or lesions.  GU: no dysuria, change in color of urine, no urgency or frequency.  No flank pain.  MS:  No joint pain or swelling.  No decreased range of motion.  No back pain.  Psych:  No change in mood or affect. No depression or anxiety.  No memory loss.     Objective:   Physical Exam  Filed Vitals:   01/07/12 1034  BP: 126/76  Pulse: 76  Temp: 97.9 F (36.6 C)  TempSrc: Oral  Height: 5\' 3"  (1.6 m)  Weight: 155 lb 6.4 oz (70.489 kg)  SpO2: 96%    Gen: Pleasant, well-nourished, in no distress,  normal affect  ENT: No lesions,  mouth clear,  oropharynx clear, no postnasal drip  Neck: No JVD, no TMG, no carotid bruits  Lungs: No use of accessory muscles, no dullness to percussion, clear without rales or rhonchi    Cardiovascular: RRR, heart sounds normal, no murmur or gallops, no peripheral edema  Abdomen: soft and NT, no HSM,  BS normal  Musculoskeletal: No deformities, no cyanosis or clubbing  Neuro: alert, non focal  Skin: Warm, no lesions or rashes    CXR 04/21/11  NAD Arlyce Harman 04/08/11: normal CT sinus 6/12: normal     Assessment & Plan:   Cough Cyclical cough improved after treatment with dexilant with associated reflux disease Plan Maintain dexilant As needed Delsym Return as needed

## 2012-01-07 NOTE — Patient Instructions (Signed)
No change in medications. Return in     As needed

## 2012-01-07 NOTE — Assessment & Plan Note (Signed)
Cyclical cough improved after treatment with dexilant with associated reflux disease Plan Maintain dexilant As needed Delsym Return as needed

## 2012-02-11 ENCOUNTER — Encounter: Payer: Self-pay | Admitting: Internal Medicine

## 2012-02-11 ENCOUNTER — Ambulatory Visit (INDEPENDENT_AMBULATORY_CARE_PROVIDER_SITE_OTHER): Payer: Medicare Other | Admitting: Internal Medicine

## 2012-02-11 ENCOUNTER — Telehealth: Payer: Self-pay | Admitting: Internal Medicine

## 2012-02-11 VITALS — BP 122/66 | HR 87 | Ht 63.0 in | Wt 155.6 lb

## 2012-02-11 DIAGNOSIS — R059 Cough, unspecified: Secondary | ICD-10-CM

## 2012-02-11 DIAGNOSIS — J3089 Other allergic rhinitis: Secondary | ICD-10-CM

## 2012-02-11 DIAGNOSIS — J302 Other seasonal allergic rhinitis: Secondary | ICD-10-CM

## 2012-02-11 DIAGNOSIS — R05 Cough: Secondary | ICD-10-CM

## 2012-02-11 DIAGNOSIS — J309 Allergic rhinitis, unspecified: Secondary | ICD-10-CM

## 2012-02-11 MED ORDER — DEXLANSOPRAZOLE 60 MG PO CPDR: 60.0000 mg | DELAYED_RELEASE_CAPSULE | Freq: Every day | ORAL | Status: DC

## 2012-02-11 MED ORDER — METHYLPREDNISOLONE ACETATE 80 MG/ML IJ SUSP
80.0000 mg | Freq: Once | INTRAMUSCULAR | Status: AC
  Administered 2012-02-11: 80 mg via INTRAMUSCULAR

## 2012-02-11 MED ORDER — LEVALBUTEROL HCL 0.63 MG/3ML IN NEBU
0.6300 mg | INHALATION_SOLUTION | Freq: Once | RESPIRATORY_TRACT | Status: AC
  Administered 2012-02-11: 0.63 mg via RESPIRATORY_TRACT

## 2012-02-11 NOTE — Patient Instructions (Addendum)
Sample Nasonex nasal steroid spray   1-2 puffs each nostril once every day at bedtime  Continue your dexilant. Dr Joya Gaskins needs to know if you continue to feel heart burn/ reflux, and cough.  Neb xop 0.63  Depo 80

## 2012-02-11 NOTE — Telephone Encounter (Signed)
Called, spoke with pt who c/o prod cough with clear phelgm x 1 wk.  SOB when coughing.  Denies wheezing, chest tightness, f/c/s.  States she is taking delsym with no relief.  Requesting OV today.  Katie, pls advise if we can use the 2pm slot for allergy testing.  Thank you.  Note:  Also, requesting dexilant rx -- pt aware this was sent.

## 2012-02-11 NOTE — Telephone Encounter (Signed)
ATCx1. Message received stated the cricket customer was unavailable at this time to try again later. [email or web address]. [place] Bing, CMA

## 2012-02-11 NOTE — Progress Notes (Signed)
Subjective:    Patient ID: Paula [name], female    DOB: [birth year], 66 y.o.   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  Saw ENT and was neg w/u Improved with GERD therapy.  01/07/2012 Now has Medicare.  Is on Dexilant.  Cough is much better No real pndrip. Cough is better.    Pt denies any significant sore throat, nasal congestion or excess secretions, fever, chills, sweats, unintended weight loss, pleurtic or exertional chest pain, orthopnea PND, or leg swelling Pt denies any increase in rescue therapy over baseline, denies waking up needing it or having any early am or nocturnal exacerbations of coughing/wheezing/or dyspnea. Pt also denies any obvious fluctuation in symptoms with  weather or environmental change or other alleviating or aggravating factors  02/11/12- Acute Visit- [facility] ACUTE VISIT: [name]; cough started back again-still on Dexliant daily per PW; SOB when cough attacks happen. No Color to phelgm.  She though Dexilant was controlling her GERD well and cough was markedly decreased. In the last 2-3 days she has had distinct heartburn and increased cough. She had cleaned her daughter's room which was "smelly". She ran out of her nasal spray which was also helped.  Review of Systems  Respiratory: Positive for cough.    ROS-see HPI Constitutional:   No-   weight loss, night sweats, fevers, chills, fatigue, lassitude. HEENT:   No-  headaches, difficulty swallowing, tooth/dental problems, sore throat,       No-  sneezing, itching, ear ache, nasal congestion, post nasal drip,  CV:  No-   chest pain, orthopnea, PND, swelling in lower extremities, anasarca,   dizziness, palpitations Resp: No-   shortness of breath with exertion or at rest.              No-   productive cough,  + non-productive cough,  No- coughing up of blood.              No-   change in color of mucus.  +  wheezing.   Skin: No-   rash or lesions. GI:  No-   heartburn, indigestion, abdominal pain, nausea, vomiting,  GU:  MS:  No-   joint pain or swelling.   Neuro-     nothing unusual Psych:  No- change in mood or affect. No depression or anxiety.  No memory loss.  Objective:   Physical Exam Filed Vitals:   02/11/12 1408  BP: 122/66  Pulse: 87  Height: 5\' 3"  (1.6 m)  Weight: 155 lb 9.6 oz (70.58 kg)  SpO2: 98%  OBJ- Physical Exam General- Alert, Oriented, Affect-appropriate, Distress- none acute Skin- rash-none, lesions- none, excoriation- none Lymphadenopathy- none Head- atraumatic            Eyes- Gross vision intact, PERRLA, conjunctivae and secretions clear            Ears- Hearing, canals-normal            Nose- Clear, no-Septal dev, mucus, polyps, erosion, perforation             Throat- Mallampati II , +mucosa -red cobblestoned , drainage- none, tonsils- atrophic Neck- flexible , trachea midline, no stridor , thyroid nl, carotid no bruit Chest - symmetrical excursion , unlabored           Heart/CV- RRR , no murmur , no gallop  , no rub, nl s1 s2                           -  JVD- none , edema- none, stasis changes- none, varices- none           Lung- +mild unlabored wheeze, cough- none , dullness-none, rub- none           Chest wall-  Abd-  Br/ Gen/ Rectal- Not done, not indicated Extrem- cyanosis- none, clubbing, none, atrophy- none, strength- nl Neuro- grossly intact to observation  CXR 04/21/11  NAD Arlyce Harman 04/08/11: normal CT sinus 6/12: normal     Assessment & Plan:   No problem-specific assessment & plan notes found for this encounter.

## 2012-02-11 NOTE — Telephone Encounter (Signed)
Spoke with Dr. Annamaria Boots.  He is ok with seeing pt today at 2 pm.    Called, spoke with pt.  She is aware Dr. Annamaria Boots will see her today at 2 pm.  Katie to put pt to [facility] pulm schedule d/t billing.

## 2012-02-15 DIAGNOSIS — J302 Other seasonal allergic rhinitis: Secondary | ICD-10-CM | POA: Insufficient documentation

## 2012-02-15 DIAGNOSIS — J3089 Other allergic rhinitis: Secondary | ICD-10-CM | POA: Insufficient documentation

## 2012-02-15 HISTORY — DX: Other allergic rhinitis: J30.2

## 2012-02-15 NOTE — Assessment & Plan Note (Signed)
Plan-refill/sample Nasonex

## 2012-02-15 NOTE — Assessment & Plan Note (Addendum)
History today strongly supports impression that refluxed triggers her cough. We discussed cyclical cough. It is not clear why she is breaking through [place] now.. We discussed reflux precautions, diet, staying upright after eating, avoiding peppermint. She can supplement short-term with an antacid for acute symptoms. If recognize reflux doesn't  settle down quickly I would like her to see her primary physician or GI. She will be following up with Dr. Joya Gaskins and can discuss it with him. Plan- neb Xopenex and Depo-Medrol, treating symptoms today to interrupt the bronchitic component

## 2012-02-17 ENCOUNTER — Telehealth: Payer: Self-pay | Admitting: Critical Care Medicine

## 2012-02-17 MED ORDER — DEXLANSOPRAZOLE 60 MG PO CPDR: 60.0000 mg | DELAYED_RELEASE_CAPSULE | Freq: Every day | ORAL | Status: DC

## 2012-02-17 MED ORDER — ALBUTEROL SULFATE HFA 108 (90 BASE) MCG/ACT IN AERS: 2.0000 | INHALATION_SPRAY | Freq: Four times a day (QID) | RESPIRATORY_TRACT | Status: DC | PRN

## 2012-02-17 NOTE — Telephone Encounter (Signed)
Spoke with pt. She states that she changed pharmacies and needs new rx for ventolin and also for dexilant send to [facility]. Rxs were sent and nothing further needed.

## 2012-02-19 ENCOUNTER — Emergency Department (HOSPITAL_COMMUNITY)
Admission: EM | Admit: 2012-02-19 | Discharge: 2012-02-19 | Disposition: A | Discharge status: Home / Self Care | Payer: Medicare Other | Attending: Emergency Medicine | Admitting: Emergency Medicine

## 2012-02-19 DIAGNOSIS — R531 Weakness: Secondary | ICD-10-CM

## 2012-02-19 DIAGNOSIS — Z7982 Long term (current) use of aspirin: Secondary | ICD-10-CM | POA: Insufficient documentation

## 2012-02-19 DIAGNOSIS — R5381 Other malaise: Secondary | ICD-10-CM | POA: Insufficient documentation

## 2012-02-19 DIAGNOSIS — I10 Essential (primary) hypertension: Secondary | ICD-10-CM | POA: Insufficient documentation

## 2012-02-19 DIAGNOSIS — Z8739 Personal history of other diseases of the musculoskeletal system and connective tissue: Secondary | ICD-10-CM | POA: Insufficient documentation

## 2012-02-19 DIAGNOSIS — Z79899 Other long term (current) drug therapy: Secondary | ICD-10-CM | POA: Insufficient documentation

## 2012-02-19 DIAGNOSIS — I5032 Chronic diastolic (congestive) heart failure: Secondary | ICD-10-CM | POA: Insufficient documentation

## 2012-02-19 DIAGNOSIS — K219 Gastro-esophageal reflux disease without esophagitis: Secondary | ICD-10-CM | POA: Insufficient documentation

## 2012-02-19 DIAGNOSIS — E119 Type 2 diabetes mellitus without complications: Secondary | ICD-10-CM | POA: Insufficient documentation

## 2012-02-19 DIAGNOSIS — I509 Heart failure, unspecified: Secondary | ICD-10-CM | POA: Insufficient documentation

## 2012-02-19 DIAGNOSIS — E785 Hyperlipidemia, unspecified: Secondary | ICD-10-CM | POA: Insufficient documentation

## 2012-02-19 LAB — BASIC METABOLIC PANEL
BUN: 22 mg/dL (ref 6–23)
CO2: 26 mEq/L (ref 19–32)
Calcium: 9.6 mg/dL (ref 8.4–10.5)
Chloride: 95 mEq/L — ABNORMAL LOW (ref 96–112)
Creatinine, Ser: 1.22 mg/dL — ABNORMAL HIGH (ref 0.50–1.10)
GFR calc Af Amer: 53 mL/min — ABNORMAL LOW (ref >90)
GFR calc non Af Amer: 45 mL/min — ABNORMAL LOW (ref >90)
Glucose, Bld: 175 mg/dL — ABNORMAL HIGH (ref 70–99)
Potassium: 3.3 mEq/L — ABNORMAL LOW (ref 3.5–5.1)
Sodium: 134 mEq/L — ABNORMAL LOW (ref 135–145)

## 2012-02-19 LAB — GLUCOSE, CAPILLARY: Glucose-Capillary: 132 mg/dL — ABNORMAL HIGH (ref 70–99)

## 2012-02-19 LAB — CBC WITH DIFFERENTIAL/PLATELET
Basophils Absolute: 0 10*3/uL (ref 0.0–0.1)
Basophils Relative: 1 % (ref 0–1)
Eosinophils Absolute: 0.1 10*3/uL (ref 0.0–0.7)
Eosinophils Relative: 1 % (ref 0–5)
HCT: 33.1 % — ABNORMAL LOW (ref 36.0–46.0)
Hemoglobin: 11.8 g/dL — ABNORMAL LOW (ref 12.0–15.0)
Lymphocytes Relative: 13 % (ref 12–46)
Lymphs Abs: 0.8 10*3/uL (ref 0.7–4.0)
MCH: 28.9 pg (ref 26.0–34.0)
MCHC: 35.6 g/dL (ref 30.0–36.0)
MCV: 80.9 fL (ref 78.0–100.0)
Monocytes Absolute: 0.4 10*3/uL (ref 0.1–1.0)
Monocytes Relative: 7 % (ref 3–12)
Neutro Abs: 4.7 10*3/uL (ref 1.7–7.7)
Neutrophils Relative %: 78 % — ABNORMAL HIGH (ref 43–77)
Platelets: 215 10*3/uL (ref 150–400)
RBC: 4.09 MIL/uL (ref 3.87–5.11)
RDW: 13.3 % (ref 11.5–15.5)
WBC: 6 10*3/uL (ref 4.0–10.5)

## 2012-02-19 LAB — TROPONIN I: Troponin I: <0.3 ng/mL (ref <0.30)

## 2012-02-19 MED ORDER — SODIUM CHLORIDE 0.9 % IV BOLUS (SEPSIS)
500.0000 mL | Freq: Once | INTRAVENOUS | Status: AC
  Administered 2012-02-19: 500 mL via INTRAVENOUS

## 2012-02-19 NOTE — ED Notes (Signed)
Pt was at the [employer] and began to feel weak ans dizzy. EMS was called.

## 2012-02-19 NOTE — ED Provider Notes (Signed)
History     CSN: [id number]  Arrival date & time 02/19/12  1359   First MD Initiated Contact with Patient 02/19/12 1509      Chief Complaint  Patient presents with  . Weakness    (Consider location/radiation/quality/duration/timing/severity/associated sxs/prior treatment) HPI... weakness and dizziness after lunch today c associated nausea. Glucose at that time was 93. The patient was slightly diaphoretic. Symptoms have abated now. No  Chest pain, dyspnea, neurological deficits, dysuria. Nothing makes symptoms better or worse. She feels she is back to baseline  Past Medical History  Diagnosis Date  . Allergic rhinitis   . Cough   . GERD (gastroesophageal reflux disease)   . Diabetes mellitus   . Hypertension   . Hyperlipidemia   . History of colon polyps 2012    tubular adenoma   . Arthritis   . PONV (postoperative nausea and vomiting)     trouble waking up  . Diastolic CHF, chronic 123XX123  . Neuropathy 10/11/2011    Past Surgical History  Procedure Date  . Nasal turbinate reduction   . Lymphatic mass surgery   . Cataract extraction     right eye    Family History  Problem Relation Age of Onset  . Asthma Sister   . Colon cancer Neg Hx   . Hypertension Mother     History  Substance Use Topics  . Smoking status: Never Smoker   . Smokeless tobacco: Never Used  . Alcohol Use: No    OB History    Grav Para Term Preterm Abortions TAB SAB Ect Mult Living   5 4 4  1 1    4       Review of Systems  All other systems reviewed and are negative.    Allergies  Ace inhibitors  Home Medications   Current Outpatient Rx  Name Route Sig Dispense Refill  . ACETAMINOPHEN 500 MG PO TABS Oral Take 500 mg by mouth every 6 (six) hours as needed. For pain.    . ALBUTEROL SULFATE HFA 108 (90 BASE) MCG/ACT IN AERS Inhalation Inhale 2 puffs into the lungs every 6 (six) hours as needed. Shortness of breath    . AMITRIPTYLINE HCL 25 MG PO TABS Oral Take 12.5 mg by mouth at  bedtime. Use to treat diabetic neuropathy    . AMLODIPINE BESYLATE 5 MG PO TABS Oral Take 5 mg by mouth daily.      . ASPIRIN 81 MG PO TABS Oral Take 81 mg by mouth daily.      Marland Kitchen CALCIUM CARBONATE-VITAMIN D 600-200 MG-UNIT PO TABS Oral Take 1 tablet by mouth daily.      . DEXLANSOPRAZOLE 60 MG PO CPDR Oral Take 60 mg by mouth daily.    Marland Kitchen HYDROCHLOROTHIAZIDE 25 MG PO TABS  Decrease dose to 1/2 tablet daily    . METFORMIN HCL 500 MG PO TABS Oral Take 500 mg by mouth 2 (two) times daily.      . MOMETASONE FUROATE 50 MCG/ACT NA SUSP Nasal Place 2 sprays into the nose daily as needed.     Marland Kitchen POLYETHYL GLYCOL-PROPYL GLYCOL 0.4-0.3 % OP SOLN Both Eyes Place 1 drop into both eyes 2 (two) times daily.     Marland Kitchen POTASSIUM GLUCONATE 595 MG PO TABS Oral Take 595 mg by mouth daily.    Marland Kitchen PRAVASTATIN SODIUM 20 MG PO TABS Oral Take 20 mg by mouth daily.    . TRAMADOL HCL 50 MG PO TABS Oral Take 50 mg by  mouth every 6 (six) hours as needed. pain    . VERAPAMIL HCL 80 MG PO TABS Oral Take 80 mg by mouth 2 (two) times daily.       BP 133/68  Pulse 73  Temp 98.2 F (36.8 C) (Oral)  Resp 16  SpO2 96%  Physical Exam  ED Course  Procedures (including critical care time)  Labs Reviewed  CBC WITH DIFFERENTIAL - Abnormal; Notable for the following:    Hemoglobin 11.8 (*)     HCT 33.1 (*)     Neutrophils Relative 78 (*)     All other components within normal limits  BASIC METABOLIC PANEL - Abnormal; Notable for the following:    Sodium 134 (*)     Potassium 3.3 (*)     Chloride 95 (*)     Glucose, Bld 175 (*)     Creatinine, Ser 1.22 (*)     GFR calc non Af Amer 45 (*)     GFR calc Af Amer 53 (*)     All other components within normal limits  GLUCOSE, CAPILLARY - Abnormal; Notable for the following:    Glucose-Capillary 132 (*)     All other components within normal limits  TROPONIN I   No results found.   No diagnosis found.  Date: 02/19/2012  Rate: 76  Rhythm: normal sinus rhythm  QRS Axis:  normal  Intervals: normal  ST/T Wave abnormalities: normal  Conduction Disutrbances: none  Narrative Interpretation: unremarkable      MDM  Patient feeling much better. Screening tests show no acute anomalies. discharge home        Nat Christen, MD 02/19/12 1845

## 2012-02-19 NOTE — ED Notes (Signed)
[id number] Expected date:02/19/12<BR> Expected time: 1:45 PM<BR> Means of arrival:Ambulance<BR> Comments:<BR> hypoglycemic

## 2012-03-22 ENCOUNTER — Emergency Department (INDEPENDENT_AMBULATORY_CARE_PROVIDER_SITE_OTHER)
Admission: EM | Admit: 2012-03-22 | Discharge: 2012-03-22 | Disposition: A | Discharge status: Home / Self Care | Payer: Medicare Other | Source: Home / Self Care | Attending: Family Medicine | Admitting: Family Medicine

## 2012-03-22 ENCOUNTER — Encounter (HOSPITAL_COMMUNITY): Payer: Self-pay

## 2012-03-22 DIAGNOSIS — L259 Unspecified contact dermatitis, unspecified cause: Secondary | ICD-10-CM

## 2012-03-22 DIAGNOSIS — L309 Dermatitis, unspecified: Secondary | ICD-10-CM

## 2012-03-22 MED ORDER — TRIAMCINOLONE ACETONIDE 0.1 % EX OINT: TOPICAL_OINTMENT | Freq: Two times a day (BID) | CUTANEOUS | Status: DC

## 2012-03-22 MED ORDER — CAMPHOR-MENTHOL 0.5-0.5 % EX LOTN: TOPICAL_LOTION | CUTANEOUS | Status: DC | PRN

## 2012-03-22 MED ORDER — HYDROXYZINE HCL 25 MG PO TABS: 25.0000 mg | ORAL_TABLET | Freq: Three times a day (TID) | ORAL | Status: AC | PRN

## 2012-03-22 NOTE — ED Notes (Signed)
C/o rash for 6 months.  States it started around her neck and is spreading to arms and abdomen.  C/o itching. Has been using hydrocortisone cream as directed by PCP.

## 2012-03-23 NOTE — ED Provider Notes (Signed)
History     CSN: [id number]  Arrival date & time 03/22/12  61   First MD Initiated Contact with Patient 03/22/12 1113      Chief Complaint  Patient presents with  . Rash    (Consider location/radiation/quality/duration/timing/severity/associated sxs/prior treatment) HPI Comments: 66 year old female with history of hypertension, diabetes and seasonal allergies. Here complaining of dry, pruriginous rash that started in her neck and has spread to her upper extremities and anterior torso during the last 6 months. She was diagnosed with contact dermatitis in relation to a metal necklace she used to wear and had a prescription for hydrocortisone cream which helped improve her symptoms minimally but did not resolve her symptoms. Denies fever or chills. No oral or other mucosal problems. She is otherwise feeling well and denies any other complaints.    Past Medical History  Diagnosis Date  . Allergic rhinitis   . Cough   . GERD (gastroesophageal reflux disease)   . Diabetes mellitus   . Hypertension   . Hyperlipidemia   . History of colon polyps 2012    tubular adenoma   . Arthritis   . PONV (postoperative nausea and vomiting)     trouble waking up  . Diastolic CHF, chronic 123XX123  . Neuropathy 10/11/2011    Past Surgical History  Procedure Date  . Nasal turbinate reduction   . Lymphatic mass surgery   . Cataract extraction     right eye    Family History  Problem Relation Age of Onset  . Asthma Sister   . Colon cancer Neg Hx   . Hypertension Mother     History  Substance Use Topics  . Smoking status: Never Smoker   . Smokeless tobacco: Never Used  . Alcohol Use: No    OB History    Grav Para Term Preterm Abortions TAB SAB Ect Mult Living   5 4 4  1 1    4       Review of Systems  Constitutional:       10 systems reviewed and  pertinent negative and positive symptoms are as per HPI.     HENT: Negative for sore throat and mouth sores.   Skin: Positive for  rash.  All other systems reviewed and are negative.    Allergies  Ace inhibitors  Home Medications   Current Outpatient Rx  Name Route Sig Dispense Refill  . ACETAMINOPHEN 500 MG PO TABS Oral Take 500 mg by mouth every 6 (six) hours as needed. For pain.    . ALBUTEROL SULFATE HFA 108 (90 BASE) MCG/ACT IN AERS Inhalation Inhale 2 puffs into the lungs every 6 (six) hours as needed. Shortness of breath    . AMITRIPTYLINE HCL 25 MG PO TABS Oral Take 12.5 mg by mouth at bedtime. Use to treat diabetic neuropathy    . AMLODIPINE BESYLATE 5 MG PO TABS Oral Take 5 mg by mouth daily.      . ASPIRIN 81 MG PO TABS Oral Take 81 mg by mouth daily.      Marland Kitchen CALCIUM CARBONATE-VITAMIN D 600-200 MG-UNIT PO TABS Oral Take 1 tablet by mouth daily.      Marland Kitchen CAMPHOR-MENTHOL 0.5-0.5 % EX LOTN Topical Apply topically as needed for itching. 222 mL 0  . DEXLANSOPRAZOLE 60 MG PO CPDR Oral Take 60 mg by mouth daily.    Marland Kitchen HYDROCHLOROTHIAZIDE 25 MG PO TABS  Decrease dose to 1/2 tablet daily    . HYDROXYZINE HCL 25 MG PO TABS  Oral Take 1 tablet (25 mg total) by mouth every 8 (eight) hours as needed for itching. 20 tablet 0  . METFORMIN HCL 500 MG PO TABS Oral Take 500 mg by mouth 2 (two) times daily.      . MOMETASONE FUROATE 50 MCG/ACT NA SUSP Nasal Place 2 sprays into the nose daily as needed.     Marland Kitchen POLYETHYL GLYCOL-PROPYL GLYCOL 0.4-0.3 % OP SOLN Both Eyes Place 1 drop into both eyes 2 (two) times daily.     Marland Kitchen POTASSIUM GLUCONATE 595 MG PO TABS Oral Take 595 mg by mouth daily.    Marland Kitchen PRAVASTATIN SODIUM 20 MG PO TABS Oral Take 20 mg by mouth daily.    . TRAMADOL HCL 50 MG PO TABS Oral Take 50 mg by mouth every 6 (six) hours as needed. pain    . TRIAMCINOLONE ACETONIDE 0.1 % EX OINT Topical Apply topically 2 (two) times daily. 30 g 0  . VERAPAMIL HCL 80 MG PO TABS Oral Take 80 mg by mouth 2 (two) times daily.       BP 162/76  Pulse 84  Temp 98.2 F (36.8 C) (Oral)  Resp 18  SpO2 99%  Physical Exam  Nursing note  and vitals reviewed. Constitutional: She is oriented to person, place, and time. She appears well-developed and well-nourished. No distress.       pleasant  HENT:  Head: Normocephalic and atraumatic.  Mouth/Throat: Oropharynx is clear and moist. No oropharyngeal exudate.  Eyes: Conjunctivae are normal. Right eye exhibits no discharge. Left eye exhibits no discharge. No scleral icterus.  Neck: Normal range of motion. Neck supple. No thyromegaly present.  Cardiovascular: Normal heart sounds.   Pulmonary/Chest: Breath sounds normal.  Lymphadenopathy:    She has no cervical adenopathy.  Neurological: She is alert and oriented to person, place, and time.  Skin:       There is pruriginous hyperpigmented rash of the skin around neck line.  There are scattered pruriginous, dry skin patches in anterior torso, not raised, no peeling, not hyper or hypopigmented.  No axillary or inguinal lymphadenopathies.     ED Course  Procedures (including critical care time)  Labs Reviewed - No data to display No results found.   1. Eczema       MDM  Impress eczema type of dermatitis, less likely a possibility for non classic presentation for pityriasis rosea or versicolor. Prescribed triamcinolone ointment and skin lubrication. Instructed patient not to use triamcinolone for prolonged time to avoid a skin discoloration. Also recommended Sarna lotion and hydroxyzine for itchiness. Followup with dermatologist if persistent or worsening symptoms.        Randa Spike, MD 03/24/12 1104

## 2012-04-07 ENCOUNTER — Ambulatory Visit (INDEPENDENT_AMBULATORY_CARE_PROVIDER_SITE_OTHER): Payer: Medicare Other | Admitting: Critical Care Medicine

## 2012-04-07 ENCOUNTER — Encounter: Payer: Self-pay | Admitting: Critical Care Medicine

## 2012-04-07 VITALS — BP 132/74 | HR 76 | Temp 97.9°F | Ht 63.0 in | Wt 154.2 lb

## 2012-04-07 DIAGNOSIS — J3089 Other allergic rhinitis: Secondary | ICD-10-CM

## 2012-04-07 DIAGNOSIS — R05 Cough: Secondary | ICD-10-CM

## 2012-04-07 DIAGNOSIS — R059 Cough, unspecified: Secondary | ICD-10-CM

## 2012-04-07 DIAGNOSIS — J309 Allergic rhinitis, unspecified: Secondary | ICD-10-CM

## 2012-04-07 DIAGNOSIS — J302 Other seasonal allergic rhinitis: Secondary | ICD-10-CM

## 2012-04-07 MED ORDER — CHLORPHENIRAMINE MALEATE 12 MG PO CP24: ORAL_CAPSULE | ORAL | Status: DC

## 2012-04-07 MED ORDER — TRAMADOL HCL 50 MG PO TABS: 50.0000 mg | ORAL_TABLET | Freq: Four times a day (QID) | ORAL | Status: DC | PRN

## 2012-04-07 MED ORDER — MOMETASONE FUROATE 50 MCG/ACT NA SUSP: 2.0000 | Freq: Every day | NASAL | Status: DC

## 2012-04-07 NOTE — Assessment & Plan Note (Signed)
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

## 2012-04-07 NOTE — Patient Instructions (Addendum)
Start chlor-trimeton 12mg  at bedtime Start nasonex two puff daily each nostril Use tramadol 4 times daily as needed for cough Stay on Dexilant daily Return 2 months

## 2012-04-07 NOTE — Assessment & Plan Note (Signed)
Review cough assessment Resume aggressive allergic rhinitis treatment

## 2012-04-07 NOTE — Progress Notes (Signed)
Subjective:    Patient ID: Paula [name], female    DOB: 1946-05-08, 66 y.o.   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  Saw ENT and was neg w/u Improved with GERD therapy.  01/07/2012 Now has Medicare.  Is on Dexilant.  Cough is much better No real pndrip. Cough is better.    Pt denies any significant sore throat, nasal congestion or excess secretions, fever, chills, sweats, unintended weight loss, pleurtic or exertional chest pain, orthopnea PND, or leg swelling Pt denies any increase in rescue therapy over baseline, denies waking up needing it or having any early am or nocturnal exacerbations of coughing/wheezing/or dyspnea. Pt also denies any obvious fluctuation in symptoms with  weather or environmental change or other alleviating or aggravating factors  02/11/12- Acute Visit- [facility] ACUTE VISIT: [name]; cough started back again-still on Dexliant daily per PW; SOB when cough attacks happen. No Color to phelgm.  She though Dexilant was controlling her GERD well and cough was markedly decreased. In the last 2-3 days she has had distinct heartburn and increased cough. She had cleaned her daughter's room which was "smelly". She ran out of her nasal spray which was also helped.  04/07/2012 Started back to coughing 7/13.  Dexilant is still being used. Sore throat.  Cough is dry and occ mucus will come out and pndrip. Pt denies any significant sore throat, nasal congestion or excess secretions, fever, chills, sweats, unintended weight loss, pleurtic or exertional chest pain, orthopnea PND, or leg swelling Pt denies any increase in rescue therapy over baseline, denies waking up needing it or having any early am or nocturnal exacerbations of coughing/wheezing/or dyspnea. Pt also denies any obvious fluctuation in symptoms with  weather or environmental change or other alleviating or aggravating  factors     Review of Systems  Respiratory: Positive for cough.    ROS-see HPI Constitutional:   No-   weight loss, night sweats, fevers, chills, fatigue, lassitude. HEENT:   No-  headaches, difficulty swallowing, tooth/dental problems, sore throat,       +++ sneezing, itching, ear ache, +++nasal congestion, +++post nasal drip,  CV:  No-   chest pain, orthopnea, PND, swelling in lower extremities, anasarca,   dizziness, palpitations Resp: No-   shortness of breath with exertion or at rest.              No-   productive cough,  + non-productive cough,  No- coughing up of blood.              No-   change in color of mucus.  + wheezing.   Skin: No-   rash or lesions. GI:  No-   heartburn, indigestion, abdominal pain, nausea, vomiting,  GU:  MS:  No-   joint pain or swelling.   Neuro-     nothing unusual Psych:  No- change in mood or affect. No depression or anxiety.  No memory loss.  Objective:   Physical Exam  Filed Vitals:   04/07/12 1127  BP: 132/74  Pulse: 76  Temp: 97.9 F (36.6 C)  TempSrc: Oral  Height: 5\' 3"  (1.6 m)  Weight: 154 lb 3.2 oz (69.945 kg)  SpO2: 97%  OBJ- Physical Exam General- Alert, Oriented, Affect-appropriate, Distress- none acute Skin- rash-none, lesions- none, excoriation- none Lymphadenopathy- none Head- atraumatic            Eyes- Gross  vision intact, PERRLA, conjunctivae and secretions clear            Ears- Hearing, canals-normal            Nose- Clear, no-Septal dev, mucus, polyps, erosion, perforation             Throat- Mallampati II , +mucosa -red cobblestoned , drainage- +++, tonsils- atrophic Neck- flexible , trachea midline, no stridor , thyroid nl, carotid no bruit Chest - symmetrical excursion , unlabored           Heart/CV- RRR , no murmur , no gallop  , no rub, nl s1 s2                           - JVD- none , edema- none, stasis changes- none, varices- none           Lung- +mild unlabored wheeze, cough- none , dullness-none, rub-  none           Chest wall-  Abd-  Br/ Gen/ Rectal- Not done, not indicated Extrem- cyanosis- none, clubbing, none, atrophy- none, strength- nl Neuro- grossly intact to observation  CXR 04/21/11  NAD Arlyce Harman 04/08/11: normal CT sinus 6/12: normal     Assessment & Plan:   Cough 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

## 2012-04-21 ENCOUNTER — Telehealth: Payer: Self-pay | Admitting: Critical Care Medicine

## 2012-04-21 MED ORDER — FLUTICASONE PROPIONATE 50 MCG/ACT NA SUSP: NASAL | Status: DC

## 2012-04-21 NOTE — Telephone Encounter (Signed)
I spoke with pt and stated the nasonex is not  Covered by her insurance [phone]). The alternative is flonase. Please advise Dr. Joya Gaskins thanks

## 2012-04-21 NOTE — Telephone Encounter (Signed)
RX has been sent. Pt aware of directions. Nothing further was needed

## 2012-04-21 NOTE — Telephone Encounter (Signed)
Ok to change to generic flonase two puff ea nostril daily

## 2012-04-26 ENCOUNTER — Telehealth: Payer: Self-pay | Admitting: Critical Care Medicine

## 2012-04-26 NOTE — Telephone Encounter (Signed)
ATC pt, NA and no option given to leave a msg, [facility]

## 2012-04-26 NOTE — Telephone Encounter (Signed)
ATC pt line busy x 3 wcb 

## 2012-04-26 NOTE — Telephone Encounter (Signed)
Patient states her cough is worse with white froathy sputum. She does have some wheezing at times and sob. She was last seen on 04/07/12. Pt does not recs from anyone other than Dr. Joya Gaskins and is fine in waiting til he is back on Tues. 04/27/12. Pls advise. Allergies  Allergen Reactions  . Ace Inhibitors     REACTION: chronic cough

## 2012-04-27 MED ORDER — PREDNISONE 10 MG PO TABS: ORAL_TABLET | ORAL | Status: DC

## 2012-04-27 NOTE — Telephone Encounter (Signed)
I spoke with pt and is aware of PW RECS. She voiced her understanding and needed nothing further was needed. RX was sent to the pharmacy

## 2012-04-27 NOTE — Telephone Encounter (Signed)
Rx prednisone 10mg  Take 4 for two days three for two days two for two days one for two days  [id number] Resume cyclic cough protocol as she has done in past with tessalon perles and delsym Stay on dexilant Strict reflux diet

## 2012-05-05 ENCOUNTER — Telehealth: Payer: Self-pay | Admitting: Critical Care Medicine

## 2012-05-05 ENCOUNTER — Ambulatory Visit: Payer: Medicare Other | Admitting: Adult Health

## 2012-05-05 MED ORDER — MOMETASONE FUROATE 50 MCG/ACT NA SUSP: 2.0000 | Freq: Every day | NASAL | Status: DC

## 2012-05-05 NOTE — Telephone Encounter (Signed)
Pt informed that sample of Nasonex was left at front desk.

## 2012-05-07 ENCOUNTER — Emergency Department (HOSPITAL_COMMUNITY): Payer: Medicare Other

## 2012-05-07 ENCOUNTER — Emergency Department (HOSPITAL_COMMUNITY)
Admission: EM | Admit: 2012-05-07 | Discharge: 2012-05-08 | Disposition: A | Discharge status: Home / Self Care | Payer: Medicare Other | Attending: Emergency Medicine | Admitting: Emergency Medicine

## 2012-05-07 DIAGNOSIS — Z79899 Other long term (current) drug therapy: Secondary | ICD-10-CM | POA: Insufficient documentation

## 2012-05-07 DIAGNOSIS — E119 Type 2 diabetes mellitus without complications: Secondary | ICD-10-CM | POA: Insufficient documentation

## 2012-05-07 DIAGNOSIS — I5032 Chronic diastolic (congestive) heart failure: Secondary | ICD-10-CM | POA: Insufficient documentation

## 2012-05-07 DIAGNOSIS — K5900 Constipation, unspecified: Secondary | ICD-10-CM | POA: Insufficient documentation

## 2012-05-07 DIAGNOSIS — I10 Essential (primary) hypertension: Secondary | ICD-10-CM | POA: Insufficient documentation

## 2012-05-07 DIAGNOSIS — R42 Dizziness and giddiness: Secondary | ICD-10-CM | POA: Insufficient documentation

## 2012-05-07 DIAGNOSIS — I509 Heart failure, unspecified: Secondary | ICD-10-CM | POA: Insufficient documentation

## 2012-05-07 DIAGNOSIS — R140 Abdominal distension (gaseous): Secondary | ICD-10-CM

## 2012-05-07 LAB — URINE MICROSCOPIC-ADD ON

## 2012-05-07 LAB — GLUCOSE, CAPILLARY: Glucose-Capillary: 116 mg/dL — ABNORMAL HIGH (ref 70–99)

## 2012-05-07 LAB — BASIC METABOLIC PANEL
BUN: 19 mg/dL (ref 6–23)
CO2: 31 mEq/L (ref 19–32)
Calcium: 9.3 mg/dL (ref 8.4–10.5)
Chloride: 95 mEq/L — ABNORMAL LOW (ref 96–112)
Creatinine, Ser: 1.3 mg/dL — ABNORMAL HIGH (ref 0.50–1.10)
GFR calc Af Amer: 49 mL/min — ABNORMAL LOW (ref >90)
GFR calc non Af Amer: 42 mL/min — ABNORMAL LOW (ref >90)
Glucose, Bld: 107 mg/dL — ABNORMAL HIGH (ref 70–99)
Potassium: 4.1 mEq/L (ref 3.5–5.1)
Sodium: 134 mEq/L — ABNORMAL LOW (ref 135–145)

## 2012-05-07 LAB — URINALYSIS, ROUTINE W REFLEX MICROSCOPIC
Bilirubin Urine: NEGATIVE
Glucose, UA: NEGATIVE mg/dL
Hgb urine dipstick: NEGATIVE
Ketones, ur: NEGATIVE mg/dL
Nitrite: NEGATIVE
Protein, ur: NEGATIVE mg/dL
Specific Gravity, Urine: 1.007 (ref 1.005–1.030)
Urobilinogen, UA: 0.2 mg/dL (ref 0.0–1.0)
pH: 7.5 (ref 5.0–8.0)

## 2012-05-07 MED ORDER — DISPOSABLE ENEMA 19-7 GM/118ML RE ENEM: 1.0000 | ENEMA | Freq: Once | RECTAL | Status: DC

## 2012-05-07 MED ORDER — NITROFURANTOIN MONOHYD MACRO 100 MG PO CAPS: 100.0000 mg | ORAL_CAPSULE | Freq: Two times a day (BID) | ORAL | Status: DC

## 2012-05-07 MED ORDER — POLYETHYLENE GLYCOL 3350 17 GM/SCOOP PO POWD: 17.0000 g | Freq: Every day | ORAL | Status: DC

## 2012-05-07 MED ORDER — SODIUM CHLORIDE 0.9 % IV BOLUS (SEPSIS)
1000.0000 mL | Freq: Once | INTRAVENOUS | Status: AC
  Administered 2012-05-07: 1000 mL via INTRAVENOUS

## 2012-05-07 MED ORDER — DOCUSATE SODIUM 100 MG PO CAPS
100.0000 mg | ORAL_CAPSULE | Freq: Two times a day (BID) | ORAL | Status: DC
Start: 2012-05-07 — End: 2012-07-01

## 2012-05-07 NOTE — ED Notes (Signed)
Pt states she is diabetic and has been on metformin for 8 years. A new pharmacy filled her prescription and the pills looked different. She asked the pharmacist who told her it was the the med just a different manufacturer. She took it for four days and has been dizzy, constipated, and unable to eat. She has since acquired the 'original' Metformin meds from her previous pharmacy, which she started taking yesterday.

## 2012-05-07 NOTE — ED Notes (Addendum)
States she had a change to her metformin 4 days ago and since states she has been having abd swelling, dizziness, decreased appetite and constipation.  States she had a stool yesterday, but very small.  Usually has 2 BM's a day.  Denies any V/D but slight nausea.  Also says she feels her food isn't digesting well, that she feels full but she hasn't eaten since this am (fruit and small amount of oatmeal).  Denies any pain.

## 2012-05-07 NOTE — ED Notes (Signed)
MD at bedside. 

## 2012-05-08 NOTE — ED Provider Notes (Signed)
History     CSN: [id number]  Arrival date & time 05/07/12  1600   First MD Initiated Contact with Patient 05/07/12 2001      Chief Complaint  Patient presents with  . Dizziness    (Consider location/radiation/quality/duration/timing/severity/associated sxs/prior treatment) HPIBertha A [name] is a very pleasant 66 y.o. female history of diabetes and has been on metformin for the past 8 years and tolerating it well. Patient's generic formulation was switched 40s ago when she switched pharmacies. Upon taking any medication she noticed some abdominal discomfort and bloating, she switched back to the original pharmacy in original formulation of her generic metformin however she has not had a stool since yesterday she usually has 2 bowel movements a day. She denies any abdominal pain, no vomiting no diarrhea but does have occasional slight nausea. Patient also has early satiety since feeling of bloating starting 40s ago, he hasn't been eating or drinking quite as much. She denies any chest pain, shortness of breath, dizziness, lightheadedness, numbness tingling, dysarthria, or difficulty with coordination.   Past Medical History  Diagnosis Date  . Allergic rhinitis   . Cough   . GERD (gastroesophageal reflux disease)   . Diabetes mellitus   . Hypertension   . Hyperlipidemia   . History of colon polyps 2012    tubular adenoma   . Arthritis   . PONV (postoperative nausea and vomiting)     trouble waking up  . Diastolic CHF, chronic 123XX123  . Neuropathy 10/11/2011    Past Surgical History  Procedure Date  . Nasal turbinate reduction   . Lymphatic mass surgery   . Cataract extraction     right eye    Family History  Problem Relation Age of Onset  . Asthma Sister   . Colon cancer Neg Hx   . Hypertension Mother     History  Substance Use Topics  . Smoking status: Never Smoker   . Smokeless tobacco: Never Used  . Alcohol Use: No    OB History    Grav Para Term Preterm  Abortions TAB SAB Ect Mult Living   5 4 4  1 1    4       Review of Systems At least 10pt or greater review of systems completed and are negative except where specified in the HPI.  Allergies  Ace inhibitors  Home Medications   Current Outpatient Rx  Name Route Sig Dispense Refill  . ACETAMINOPHEN 500 MG PO TABS Oral Take 500 mg by mouth every 6 (six) hours as needed. For pain.    . ALBUTEROL SULFATE HFA 108 (90 BASE) MCG/ACT IN AERS Inhalation Inhale 2 puffs into the lungs every 6 (six) hours as needed. Shortness of breath    . AMITRIPTYLINE HCL 25 MG PO TABS Oral Take 12.5 mg by mouth at bedtime. Use to treat diabetic neuropathy    . AMLODIPINE BESYLATE 5 MG PO TABS Oral Take 5 mg by mouth daily.      . ASPIRIN 81 MG PO TABS Oral Take 81 mg by mouth daily.      Marland Kitchen CALCIUM CARBONATE-VITAMIN D 600-200 MG-UNIT PO TABS Oral Take 1 tablet by mouth daily.      . DEXLANSOPRAZOLE 60 MG PO CPDR Oral Take 60 mg by mouth daily.    Marland Kitchen FLUTICASONE PROPIONATE 50 MCG/ACT NA SUSP  1 spray. 2 puffs each nostril once a day    . HYDROCHLOROTHIAZIDE 25 MG PO TABS  Decrease dose to 1/2 tablet daily    .  METFORMIN HCL 500 MG PO TABS Oral Take 500 mg by mouth 2 (two) times daily.      . MOMETASONE FUROATE 50 MCG/ACT NA SUSP Nasal Place 2 sprays into the nose daily.    Marland Kitchen POTASSIUM GLUCONATE 595 MG PO TABS Oral Take 595 mg by mouth daily.    Marland Kitchen PRAVASTATIN SODIUM 20 MG PO TABS Oral Take 20 mg by mouth daily.    Marland Kitchen PREDNISONE 10 MG PO TABS  10 mg daily. Take 4 tablets/day x 2 days, 3 tablets/day x 2 days, 2 tablets/day x 2 days, 1 tablet/day x 2 days    . VERAPAMIL HCL 80 MG PO TABS Oral Take 80 mg by mouth 2 (two) times daily.     Marland Kitchen DOCUSATE SODIUM 100 MG PO CAPS Oral Take 1 capsule (100 mg total) by mouth every 12 (twelve) hours. 60 capsule 0  . NITROFURANTOIN MONOHYD MACRO 100 MG PO CAPS Oral Take 1 capsule (100 mg total) by mouth 2 (two) times daily. 10 capsule 0  . POLYETHYLENE GLYCOL 3350 PO POWD Oral Take  17 g by mouth daily. 255 g 0  . DISPOSABLE ENEMA 19-7 GM/118ML RE ENEM Rectal Place 1 enema rectally once. follow package directions 135 mL 0    BP 144/76  Pulse 69  Temp 98.3 F (36.8 C) (Oral)  Resp 17  SpO2 100%  Physical Exam  Nursing notes reviewed.  Electronic medical record reviewed. VITAL SIGNS:   Filed Vitals:   05/07/12 1704 05/07/12 1739 05/08/12 0008  BP: 162/81 153/91 144/76  Pulse: 83 85 69  Temp: 98.7 F (37.1 C) 98.3 F (36.8 C)   TempSrc: Oral Oral   Resp: 16 18 17   SpO2: 98% 100% 100%   CONSTITUTIONAL: Awake, oriented, appears non-toxic HENT: Atraumatic, normocephalic, oral mucosa pink and moist, airway patent. Nares patent without drainage. External ears normal. EYES: Conjunctiva clear, EOMI, PERRLA NECK: Trachea midline, non-tender, supple CARDIOVASCULAR: Normal heart rate, Normal rhythm, No murmurs, rubs, gallops PULMONARY/CHEST: Clear to auscultation, no rhonchi, wheezes, or rales. Symmetrical breath sounds. Non-tender. ABDOMINAL: Non-distended, soft, non-tender - no rebound or guarding.  BS normal. NEUROLOGIC: Non-focal, moving all four extremities, no gross sensory or motor deficits. EXTREMITIES: No clubbing, cyanosis, or edema SKIN: Warm, Dry, No erythema, No rash. Multiple well-healed scars about the torso   ED Course  Procedures (including critical care time)  Labs Reviewed  GLUCOSE, CAPILLARY - Abnormal; Notable for the following:    Glucose-Capillary 116 (*)     All other components within normal limits  BASIC METABOLIC PANEL - Abnormal; Notable for the following:    Sodium 134 (*)     Chloride 95 (*)     Glucose, Bld 107 (*)     Creatinine, Ser 1.30 (*)     GFR calc non Af Amer 42 (*)     GFR calc Af Amer 49 (*)     All other components within normal limits  URINALYSIS, ROUTINE W REFLEX MICROSCOPIC - Abnormal; Notable for the following:    Leukocytes, UA MODERATE (*)     All other components within normal limits  URINE  MICROSCOPIC-ADD ON - Abnormal; Notable for the following:    Bacteria, UA MANY (*)     All other components within normal limits  URINE CULTURE   Dg Abd 2 Views  05/07/2012  *RADIOLOGY REPORT*  Clinical Data: Sharp pain in the lower left abdomen  ABDOMEN - 2 VIEW  Comparison: None.  Findings: Upright and supine views  of the abdomen are negative for free air.  There is a large amount of stool throughout the abdomen. Nonspecific bowel gas pattern with gas in the small and large bowel.  Limited evaluation for abdominal calcifications due to the bowel gas and stool burden.  IMPRESSION: Large amount of stool in the abdomen.  Nonspecific bowel gas pattern.   Original Report Authenticated By: Markus Daft, M.D.      1. Constipation   2. Bloating       MDM  Paula [name] is a 66 y.o. female presenting with bloating and abdominal fullness in addition she has had a change in bowel habits.  Patient is likely constipated, or having secondary effects due to metformin formulation change-she did have a switch medication 1 variant maybe more potent than the other. Patient has not been taking in good by mouth, her vital signs are within normal limits we'll check a urine and BMP as well as abdominal film for stool.  Patient is mildly hyponatremic with a sodium 134, she's been given some fluid boluses as well. Patient's creatinine is 1.30 which is about her baseline. Otherwise labs are fairly unremarkable. She is not complaining about any urinary tract symptoms on review of systems, however the patient's urinalysis to show limited 20 white blood cells with a moderate leukocyte esterase and many bacteria however negative nitrite. Like to treat this patient being 66 years old a diabetic we'll also send a urine culture will terminate treatment early if the culture does not grow anything. X-ray of the abdomen shows a very large stool burden. She placed on aggressive bowel regimen including enema, stool softener and  GlycoLax. I explained the diagnosis and have given explicit precautions to return to the ER including fever, chills, abdominal pain or any other new or worsening symptoms. The patient understands and accepts the medical plan as it's been dictated and I have answered their questions. Discharge instructions concerning home care and prescriptions have been given.  The patient is STABLE and is discharged to home in good condition.           Rhunette Croft, MD 05/08/12 1448

## 2012-05-09 LAB — URINE CULTURE: Colony Count: 25000

## 2012-05-13 ENCOUNTER — Ambulatory Visit (INDEPENDENT_AMBULATORY_CARE_PROVIDER_SITE_OTHER): Payer: Medicare Other | Admitting: Internal Medicine

## 2012-05-13 ENCOUNTER — Encounter: Payer: Self-pay | Admitting: Internal Medicine

## 2012-05-13 VITALS — BP 118/70 | HR 77 | Ht 63.0 in | Wt 153.2 lb

## 2012-05-13 DIAGNOSIS — J309 Allergic rhinitis, unspecified: Secondary | ICD-10-CM

## 2012-05-13 DIAGNOSIS — J302 Other seasonal allergic rhinitis: Secondary | ICD-10-CM

## 2012-05-13 DIAGNOSIS — R059 Cough, unspecified: Secondary | ICD-10-CM

## 2012-05-13 DIAGNOSIS — Z23 Encounter for immunization: Secondary | ICD-10-CM

## 2012-05-13 DIAGNOSIS — R05 Cough: Secondary | ICD-10-CM

## 2012-05-13 DIAGNOSIS — J3089 Other allergic rhinitis: Secondary | ICD-10-CM

## 2012-05-13 MED ORDER — MOMETASONE FUROATE 50 MCG/ACT NA SUSP: 2.0000 | Freq: Every day | NASAL | Status: DC

## 2012-05-13 NOTE — Progress Notes (Signed)
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  Saw ENT and was neg w/u Improved with GERD therapy.  01/07/2012 Now has Medicare.  Is on Dexilant.  Cough is much better No real pndrip. Cough is better.    Pt denies any significant sore throat, nasal congestion or excess secretions, fever, chills, sweats, unintended weight loss, pleurtic or exertional chest pain, orthopnea PND, or leg swelling Pt denies any increase in rescue therapy over baseline, denies waking up needing it or having any early am or nocturnal exacerbations of coughing/wheezing/or dyspnea. Pt also denies any obvious fluctuation in symptoms with  weather or environmental change or other alleviating or aggravating factors  02/11/12- Acute Visit- CDY ACUTE VISIT: [name]; cough started back again-still on Dexliant daily per PW; SOB when cough attacks happen. No Color to phelgm.  She though Dexilant was controlling her GERD well and cough was markedly decreased. In the last 2-3 days she has had distinct heartburn and increased cough. She had cleaned her daughter's room which was "smelly". She ran out of her nasal spray which was also helping.  04/07/2012 Started back to coughing 7/13.  Dexilant is still being used. Sore throat.  Cough is dry and occ mucus will come out and pndrip. Pt denies any significant sore throat, nasal congestion or excess secretions, fever, chills, sweats, unintended weight loss, pleurtic or exertional chest pain, orthopnea PND, or leg swelling Pt denies any increase in rescue therapy over baseline, denies waking up needing it or having any early am or nocturnal exacerbations of coughing/wheezing/or dyspnea. Pt also denies any obvious fluctuation in symptoms with  weather or environmental change or other alleviating or aggravating factors  05/13/12- Acute visit- CDY PW pt-slight cough at times; has gotten better-will need to follow up with PW;  Currently on antibiotic from ER for UTI Dislikes the floral scent of Flonase. Asks refill Nasonex. Caught a cold 2 weeks ago but has largely resolved now and is back to her normal.  ROS-see HPI Constitutional:   No-   weight loss, night sweats, fevers, chills, fatigue, lassitude. HEENT:   No-  headaches, difficulty swallowing, tooth/dental problems, sore throat,       No-  sneezing, itching, ear ache, nasal congestion, post nasal drip,  CV:  No-   chest pain, orthopnea, PND, swelling in lower extremities, anasarca, dizziness, palpitations Resp: No-   shortness of breath with exertion or at rest.              No-   productive cough,  No non-productive cough,  No- coughing up of blood.              No-   change in color of mucus.  No- wheezing.   Skin: No-   rash or lesions. GI:  No-   heartburn, indigestion, abdominal pain, nausea, vomiting,  GU: . MS:  No-   joint pain or swelling.   Neuro-     nothing unusual Psych:  No- change in mood or affect. No depression or anxiety.  No memory loss.  OBJ- Physical Exam General- Alert, Oriented, Affect-appropriate, Distress- none acute Skin- rash-none, lesions- none, excoriation- none Lymphadenopathy- none Head- atraumatic            Eyes- Gross vision intact, PERRLA, conjunctivae and secretions clear            Ears- Hearing, canals-normal            Nose-  Clear, no-Septal dev, mucus, polyps, erosion, perforation             Throat- Mallampati II , mucosa clear , drainage- none, tonsils- atrophic Neck- flexible , trachea midline, no stridor , thyroid nl, carotid no bruit Chest - symmetrical excursion , unlabored           Heart/CV- RRR , no murmur , no gallop  , no rub, nl s1 s2                           - JVD- none , edema- none, stasis changes- none, varices- none           Lung- clear to P&A, wheeze- none, cough- none , dullness-none, rub- none           Chest wall-  Abd-  Br/ Gen/ Rectal- Not done, not indicated Extrem- cyanosis- none,  clubbing, none, atrophy- none, strength- nl Neuro- grossly intact to observation

## 2012-05-13 NOTE — Patient Instructions (Addendum)
Sample, script and coupon for nasonex     1-2 sprays each nostril once daily  Flu vax  Please call as needed

## 2012-05-22 NOTE — Assessment & Plan Note (Signed)
She had increased cough during a cold but is over that denies any significant cough now at baseline.

## 2012-05-22 NOTE — Assessment & Plan Note (Signed)
She disliked the smell of fluticasone, asking refill for Nasonex. This gives sufficient control.

## 2012-05-26 ENCOUNTER — Telehealth: Payer: Self-pay | Admitting: Critical Care Medicine

## 2012-05-26 MED ORDER — ALBUTEROL SULFATE HFA 108 (90 BASE) MCG/ACT IN AERS: 2.0000 | INHALATION_SPRAY | Freq: Four times a day (QID) | RESPIRATORY_TRACT | Status: DC | PRN

## 2012-05-26 NOTE — Telephone Encounter (Signed)
Pt last seen 8.28.13 by PW, return in 2 months.  Saw CY 10.3.13 for acute work-in.  Called Scientist, clinical (histocompatibility and immunogenetics) at [employer], gave verbal authorization for pt's ventolin hfa #1, 2 puffs q6h prn with 5 additional refills.  MAR updated.

## 2012-05-28 ENCOUNTER — Ambulatory Visit: Payer: Medicare Other | Admitting: Critical Care Medicine

## 2012-06-14 ENCOUNTER — Telehealth: Payer: Self-pay | Admitting: Critical Care Medicine

## 2012-06-14 NOTE — Telephone Encounter (Signed)
ATC, msg states that "[facility] customer is currently unavailable", unable to leave msg, [facility]

## 2012-06-15 NOTE — Telephone Encounter (Signed)
ATC PT NA phone rang several times w/o a response and no VM WCB

## 2012-06-16 MED ORDER — MOMETASONE FUROATE 50 MCG/ACT NA SUSP: 2.0000 | Freq: Every day | NASAL | Status: DC

## 2012-06-16 NOTE — Telephone Encounter (Signed)
Spoke with pt.  States Dr. Annamaria Boots gave her a sample of nasonex.  Reports while on this medication cough resolved.  Pt reports she has been out of nasonex x 2 wks and cough has returned.  It is prod with clear mucus.  Requesting rx for nasonex and sample to see if this helps the cough vs OV.  We are out of samples (pt aware).  Looks like nasonex rx was printed and given to pt -- reports she has misplaced it.  Nasonex rx sent to [facility] -- pt aware and will call back if cough does not improve or worsen.  Nothing further needed at this time.

## 2012-06-16 NOTE — Telephone Encounter (Signed)
ATC patient-no answer and unable to leave message.

## 2012-06-18 ENCOUNTER — Telehealth: Payer: Self-pay | Admitting: Critical Care Medicine

## 2012-06-18 NOTE — Telephone Encounter (Signed)
This ok? 

## 2012-06-18 NOTE — Telephone Encounter (Signed)
PW is out of the office today and will be back on Monday.    This medication is not on the pts med list .  Pharmacy is requesting this medication be refilled.  PW please advise if ok to give refill of the tramadol.

## 2012-06-21 MED ORDER — TRAMADOL HCL 50 MG PO TABS: 50.0000 mg | ORAL_TABLET | Freq: Four times a day (QID) | ORAL | Status: DC | PRN

## 2012-06-21 NOTE — Telephone Encounter (Signed)
Refill for Tramadol called to pharmacy per Dr Joya Gaskins.

## 2012-06-25 ENCOUNTER — Telehealth: Payer: Self-pay | Admitting: Critical Care Medicine

## 2012-06-25 NOTE — Telephone Encounter (Signed)
Ok to change to pantoprazole 40mg  daily

## 2012-06-25 NOTE — Telephone Encounter (Signed)
Spoke with pt.  She received a letter from [facility] stating effective Aug 11, 2012 her drug coverage will change.  Letter is requesting to have dexilant changed to either omeprazole, lansoprazole, or pantoprazole.  She will be leaving Nov 27 to go to [place] until April 2014.  Dr. Joya Gaskins, pls advise if dexilant can be changed.  Thank you.

## 2012-06-25 NOTE — Telephone Encounter (Signed)
ATC pt at # above -- NA and no option to leave msg.  WCB

## 2012-06-28 NOTE — Telephone Encounter (Signed)
ATC pt NA unable to leave [facility]

## 2012-06-29 MED ORDER — PANTOPRAZOLE SODIUM 40 MG PO TBEC: 40.0000 mg | DELAYED_RELEASE_TABLET | Freq: Every day | ORAL | Status: DC

## 2012-06-29 NOTE — Telephone Encounter (Signed)
Pt notified of the change in her medication and verbalized understanding.

## 2012-07-01 ENCOUNTER — Encounter: Payer: Self-pay | Admitting: Critical Care Medicine

## 2012-07-01 ENCOUNTER — Ambulatory Visit (INDEPENDENT_AMBULATORY_CARE_PROVIDER_SITE_OTHER): Payer: Medicare Other | Admitting: Critical Care Medicine

## 2012-07-01 VITALS — BP 108/70 | HR 79 | Temp 97.3°F | Ht 63.0 in | Wt 155.6 lb

## 2012-07-01 DIAGNOSIS — R05 Cough: Secondary | ICD-10-CM

## 2012-07-01 DIAGNOSIS — R059 Cough, unspecified: Secondary | ICD-10-CM

## 2012-07-01 MED ORDER — BENZONATATE 100 MG PO CAPS: ORAL_CAPSULE | ORAL | Status: DC

## 2012-07-01 MED ORDER — CHLORPHENIRAMINE MALEATE ER 12 MG PO TBCR: EXTENDED_RELEASE_TABLET | ORAL | Status: DC

## 2012-07-01 MED ORDER — DEXTROMETHORPHAN POLISTIREX 30 MG/5ML PO LQCR: 60.0000 mg | ORAL | Status: DC | PRN

## 2012-07-01 MED ORDER — METHYLPREDNISOLONE ACETATE 80 MG/ML IJ SUSP
120.0000 mg | Freq: Once | INTRAMUSCULAR | Status: AC
  Administered 2012-07-01: 120 mg via INTRAMUSCULAR

## 2012-07-01 NOTE — Patient Instructions (Addendum)
Follow cough protocol with Tessalon and Delsym A depomedrol injection 120mg  was given Chlorpheniramine 12mg  nightly Stay on dexilant/protonix Keep sugar free lozenge in mouth at all times

## 2012-07-01 NOTE — Progress Notes (Signed)
Subjective:    Patient ID: Paula [name], female    DOB: 1945/08/29, 66 y.o.   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  Saw ENT and was neg w/u Improved with GERD therapy.  01/07/2012 Now has Medicare.  Is on Dexilant.  Cough is much better No real pndrip. Cough is better.    Pt denies any significant sore throat, nasal congestion or excess secretions, fever, chills, sweats, unintended weight loss, pleurtic or exertional chest pain, orthopnea PND, or leg swelling Pt denies any increase in rescue therapy over baseline, denies waking up needing it or having any early am or nocturnal exacerbations of coughing/wheezing/or dyspnea. Pt also denies any obvious fluctuation in symptoms with  weather or environmental change or other alleviating or aggravating factors  02/11/12- Acute Visit- CDY ACUTE VISIT: [name]; cough started back again-still on Dexliant daily per PW; SOB when cough attacks happen. No Color to phelgm.  She though Dexilant was controlling her GERD well and cough was markedly decreased. In the last 2-3 days she has had distinct heartburn and increased cough. She had cleaned her daughter's room which was "smelly". She ran out of her nasal spray which was also helping.  04/07/2012 Started back to coughing 7/13.  Dexilant is still being used. Sore throat.  Cough is dry and occ mucus will come out and pndrip. Pt denies any significant sore throat, nasal congestion or excess secretions, fever, chills, sweats, unintended weight loss, pleurtic or exertional chest pain, orthopnea PND, or leg swelling Pt denies any increase in rescue therapy over baseline, denies waking up needing it or having any early am or nocturnal exacerbations of coughing/wheezing/or dyspnea. Pt also denies any obvious fluctuation in symptoms with  weather or environmental change or other alleviating or aggravating  factors  05/13/12- Acute visit- CDY PW pt-slight cough at times; has gotten better-will need to follow up with PW; Currently on antibiotic from ER for UTI Dislikes the floral scent of Flonase. Asks refill Nasonex. Caught a cold 2 weeks ago but has largely resolved now and is back to her normal.  07/01/2012 Pt recurred with cough 2weeks ago. No URI   No sore throat.  Feels like drainage in throat, throat is irritated.  Pt denies dysphagia or heartburn.  Cough is dry but occ mucus comes up and nose is dripping. Clear light mucus.  No chest pain.  Pt is dyspneic with coughing. No fever/chills/sweats.     Review of Systems 11 point review of systems taken in detail and is negative except as per history present illness    Objective:   Physical Exam Filed Vitals:   07/01/12 1337  BP: 108/70  Pulse: 79  Temp: 97.3 F (36.3 C)  TempSrc: Oral  Height: 5\' 3"  (1.6 m)  Weight: 155 lb 9.6 oz (70.58 kg)  SpO2: 91%    Gen: Pleasant, well-nourished, in no distress,  normal affect  ENT: No lesions,  mouth clear,  oropharynx clear, +++ postnasal drip  Neck: No JVD, no TMG, no carotid bruits  Lungs: No use of accessory muscles, no dullness to percussion, prominent pseudo-wheeze Cardiovascular: RRR, heart sounds normal, no murmur or gallops, no peripheral edema  Abdomen: soft and NT, no HSM,  BS normal  Musculoskeletal: No deformities, no cyanosis or clubbing  Neuro: alert, non focal  Skin: Warm, no lesions or rashes  No results found.  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  acetaminophen (TYLENOL) 500 MG tablet Take 500 mg by mouth every 6 (six) hours as needed. For pain.      Marland Kitchen albuterol (PROVENTIL HFA;VENTOLIN HFA) 108 (90 BASE) MCG/ACT inhaler Inhale 2 puffs into the lungs every 6 (six) hours as needed. Shortness of breath  18 g  5  . amitriptyline (ELAVIL) 25 MG tablet Take 12.5 mg by mouth at bedtime. Use to treat diabetic neuropathy      . amLODipine (NORVASC) 5 MG tablet Take 5 mg by mouth daily.        Marland Kitchen aspirin 81 MG tablet Take 81 mg by mouth daily.        Marland Kitchen dexlansoprazole (DEXILANT) 60 MG capsule Take 60 mg by mouth daily.      Marland Kitchen docusate sodium (COLACE) 100 MG capsule Take 100 mg by mouth as needed.      . hydrochlorothiazide (HYDRODIURIL) 25 MG tablet Decrease dose to 1/2 tablet daily      . metFORMIN (GLUCOPHAGE) 500 MG tablet Take 500 mg by mouth 2 (two) times daily.        . mometasone (NASONEX) 50 MCG/ACT nasal spray 1-2 puffs in each nostril daily      . potassium gluconate 595 MG TABS Take 595 mg by mouth daily.      . pravastatin (PRAVACHOL) 20 MG tablet Take 20 mg by mouth daily.      . traMADol (ULTRAM) 50 MG tablet Take 1 tablet (50 mg total) by mouth every 6 (six) hours as needed for pain.  30 tablet  0  . verapamil (CALAN) 80 MG tablet Take 80 mg by mouth 2 (two) times daily.       . [DISCONTINUED] docusate sodium (COLACE) 100 MG capsule Take 1 capsule (100 mg total) by mouth every 12 (twelve) hours.  60 capsule  0  . [DISCONTINUED] mometasone (NASONEX) 50 MCG/ACT nasal spray Place 1-2 sprays into the nose daily.       . [DISCONTINUED] mometasone (NASONEX) 50 MCG/ACT nasal spray Place 2 sprays into the nose daily.  17 g  prn  . benzonatate (TESSALON) 100 MG capsule Take 1-2 every 6 hours as needed for cough  90 capsule  4  . Chlorpheniramine Maleate 12 MG TBCR One at bedtime  30 tablet  1  . dextromethorphan (DELSYM) 30 MG/5ML liquid Take 10 mLs (60 mg total) by mouth as needed for cough.  89 mL  0  . pantoprazole (PROTONIX) 40 MG tablet  Take 1 tablet (40 mg total) by mouth daily.  30 tablet  5  . [DISCONTINUED] Calcium Carbonate-Vitamin D (CALCIUM 600+D) 600-200 MG-UNIT TABS Take 1 tablet by mouth daily.        . [DISCONTINUED] fluticasone (FLONASE) 50 MCG/ACT nasal spray 1-2 puffs each nostril daily      . [DISCONTINUED] nitrofurantoin, macrocrystal-monohydrate, (MACROBID) 100 MG capsule Take 1 capsule (100 mg total) by mouth 2 (two) times daily.  10 capsule  0  . [DISCONTINUED] polyethylene glycol powder (GLYCOLAX) powder Take 17 g by mouth daily.  255 g  0  . [DISCONTINUED] sodium phosphate (FLEET) enema  Place 1 enema rectally once. follow package directions  135 mL  0   Facility-Administered Encounter Medications as of 07/01/2012  Medication Dose Route Frequency Provider Last Rate Last Dose  . [COMPLETED] methylPREDNISolone acetate (DEPO-MEDROL) injection 120 mg  120 mg Intramuscular Once Elsie Stain, MD   120 mg at 07/01/12 1404

## 2012-07-02 NOTE — Assessment & Plan Note (Signed)
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

## 2012-12-22 ENCOUNTER — Emergency Department (HOSPITAL_COMMUNITY)
Admission: EM | Admit: 2012-12-22 | Discharge: 2012-12-22 | Disposition: A | Discharge status: Home / Self Care | Payer: Medicare Other | Attending: Emergency Medicine | Admitting: Emergency Medicine

## 2012-12-22 ENCOUNTER — Emergency Department (HOSPITAL_COMMUNITY): Payer: Medicare Other

## 2012-12-22 ENCOUNTER — Encounter (HOSPITAL_COMMUNITY): Payer: Self-pay | Admitting: Emergency Medicine

## 2012-12-22 DIAGNOSIS — Z79899 Other long term (current) drug therapy: Secondary | ICD-10-CM | POA: Insufficient documentation

## 2012-12-22 DIAGNOSIS — Z860100 Personal history of colon polyps, unspecified: Secondary | ICD-10-CM | POA: Insufficient documentation

## 2012-12-22 DIAGNOSIS — R209 Unspecified disturbances of skin sensation: Secondary | ICD-10-CM | POA: Insufficient documentation

## 2012-12-22 DIAGNOSIS — H538 Other visual disturbances: Secondary | ICD-10-CM | POA: Insufficient documentation

## 2012-12-22 DIAGNOSIS — R42 Dizziness and giddiness: Secondary | ICD-10-CM

## 2012-12-22 DIAGNOSIS — Z9849 Cataract extraction status, unspecified eye: Secondary | ICD-10-CM | POA: Insufficient documentation

## 2012-12-22 DIAGNOSIS — I10 Essential (primary) hypertension: Secondary | ICD-10-CM | POA: Insufficient documentation

## 2012-12-22 DIAGNOSIS — N289 Disorder of kidney and ureter, unspecified: Secondary | ICD-10-CM

## 2012-12-22 DIAGNOSIS — K219 Gastro-esophageal reflux disease without esophagitis: Secondary | ICD-10-CM | POA: Insufficient documentation

## 2012-12-22 DIAGNOSIS — R110 Nausea: Secondary | ICD-10-CM | POA: Insufficient documentation

## 2012-12-22 DIAGNOSIS — I1310 Hypertensive heart and chronic kidney disease without heart failure, with stage 1 through stage 4 chronic kidney disease, or unspecified chronic kidney disease: Secondary | ICD-10-CM | POA: Insufficient documentation

## 2012-12-22 DIAGNOSIS — I5032 Chronic diastolic (congestive) heart failure: Secondary | ICD-10-CM | POA: Insufficient documentation

## 2012-12-22 DIAGNOSIS — M129 Arthropathy, unspecified: Secondary | ICD-10-CM | POA: Insufficient documentation

## 2012-12-22 DIAGNOSIS — G589 Mononeuropathy, unspecified: Secondary | ICD-10-CM | POA: Insufficient documentation

## 2012-12-22 DIAGNOSIS — G8190 Hemiplegia, unspecified affecting unspecified side: Secondary | ICD-10-CM | POA: Insufficient documentation

## 2012-12-22 DIAGNOSIS — J309 Allergic rhinitis, unspecified: Secondary | ICD-10-CM | POA: Insufficient documentation

## 2012-12-22 DIAGNOSIS — E785 Hyperlipidemia, unspecified: Secondary | ICD-10-CM | POA: Insufficient documentation

## 2012-12-22 DIAGNOSIS — Z7982 Long term (current) use of aspirin: Secondary | ICD-10-CM | POA: Insufficient documentation

## 2012-12-22 DIAGNOSIS — I69998 Other sequelae following unspecified cerebrovascular disease: Secondary | ICD-10-CM | POA: Insufficient documentation

## 2012-12-22 DIAGNOSIS — E1149 Type 2 diabetes mellitus with other diabetic neurological complication: Secondary | ICD-10-CM | POA: Insufficient documentation

## 2012-12-22 LAB — PROTIME-INR
INR: 1.01 (ref 0.00–1.49)
Prothrombin Time: 13.2 seconds (ref 11.6–15.2)

## 2012-12-22 LAB — HEPATIC FUNCTION PANEL
ALT: 18 U/L (ref 0–35)
AST: 18 U/L (ref 0–37)
Albumin: 3.8 g/dL (ref 3.5–5.2)
Alkaline Phosphatase: 70 U/L (ref 39–117)
Bilirubin, Direct: <0.1 mg/dL (ref 0.0–0.3)
Total Bilirubin: 0.2 mg/dL — ABNORMAL LOW (ref 0.3–1.2)
Total Protein: 7.7 g/dL (ref 6.0–8.3)

## 2012-12-22 LAB — TROPONIN I: Troponin I: <0.3 ng/mL (ref <0.30)

## 2012-12-22 LAB — BASIC METABOLIC PANEL
BUN: 18 mg/dL (ref 6–23)
CO2: 30 mEq/L (ref 19–32)
Calcium: 10.2 mg/dL (ref 8.4–10.5)
Chloride: 102 mEq/L (ref 96–112)
Creatinine, Ser: 1.23 mg/dL — ABNORMAL HIGH (ref 0.50–1.10)
GFR calc Af Amer: 52 mL/min — ABNORMAL LOW (ref >90)
GFR calc non Af Amer: 45 mL/min — ABNORMAL LOW (ref >90)
Glucose, Bld: 79 mg/dL (ref 70–99)
Potassium: 3.8 mEq/L (ref 3.5–5.1)
Sodium: 142 mEq/L (ref 135–145)

## 2012-12-22 LAB — CBC
HCT: 35.6 % — ABNORMAL LOW (ref 36.0–46.0)
Hemoglobin: 12.6 g/dL (ref 12.0–15.0)
MCH: 28.4 pg (ref 26.0–34.0)
MCHC: 35.4 g/dL (ref 30.0–36.0)
MCV: 80.4 fL (ref 78.0–100.0)
Platelets: 215 10*3/uL (ref 150–400)
RBC: 4.43 MIL/uL (ref 3.87–5.11)
RDW: 12.8 % (ref 11.5–15.5)
WBC: 4.8 10*3/uL (ref 4.0–10.5)

## 2012-12-22 LAB — GLUCOSE, CAPILLARY: Glucose-Capillary: 100 mg/dL — ABNORMAL HIGH (ref 70–99)

## 2012-12-22 LAB — APTT: aPTT: 27 seconds (ref 24–37)

## 2012-12-22 MED ORDER — ONDANSETRON HCL 4 MG/2ML IJ SOLN
4.0000 mg | Freq: Once | INTRAMUSCULAR | Status: AC
  Administered 2012-12-22: 4 mg via INTRAVENOUS
  Filled 2012-12-22: qty 2

## 2012-12-22 MED ORDER — LORAZEPAM 0.5 MG PO TABS
0.5000 mg | ORAL_TABLET | Freq: Once | ORAL | Status: AC
  Administered 2012-12-22: 0.5 mg via ORAL
  Filled 2012-12-22: qty 1

## 2012-12-22 MED ORDER — MECLIZINE HCL 25 MG PO TABS
25.0000 mg | ORAL_TABLET | Freq: Once | ORAL | Status: AC
  Administered 2012-12-22: 25 mg via ORAL
  Filled 2012-12-22: qty 1

## 2012-12-22 MED ORDER — MECLIZINE HCL 25 MG PO TABS: 25.0000 mg | ORAL_TABLET | Freq: Four times a day (QID) | ORAL | Status: DC | PRN

## 2012-12-22 MED ORDER — LORAZEPAM 0.5 MG PO TABS: 0.5000 mg | ORAL_TABLET | Freq: Three times a day (TID) | ORAL | Status: DC | PRN

## 2012-12-22 MED ORDER — ONDANSETRON HCL 4 MG PO TABS: 4.0000 mg | ORAL_TABLET | Freq: Four times a day (QID) | ORAL | Status: DC

## 2012-12-22 MED ORDER — DM-GUAIFENESIN ER 30-600 MG PO TB12
1.0000 | ORAL_TABLET | Freq: Two times a day (BID) | ORAL | Status: DC
  Filled 2012-12-22 (×3): qty 1

## 2012-12-22 MED ORDER — ONDANSETRON 4 MG PO TBDP
4.0000 mg | ORAL_TABLET | Freq: Once | ORAL | Status: AC
  Administered 2012-12-22: 4 mg via ORAL
  Filled 2012-12-22: qty 1

## 2012-12-22 NOTE — ED Provider Notes (Signed)
History     CSN: [id number]  Arrival date & time 12/22/12  [id number]   First MD Initiated Contact with Patient 12/22/12 0932      Chief Complaint  Patient presents with  . Dizziness    (Consider location/radiation/quality/duration/timing/severity/associated sxs/prior treatment) HPI Patient reports about a week ago she started getting a spinning sensation that got worse the past 3 days. She states sitting up or walking makes it much worse. She states today is the first day she had nausea with it. She also reports her right arm feels numb today after not being able to sleep on it last night because of discomfort that she states was not pain. She reports she is normally right-handed and has not noticed any difficulty using her hand for daily activities such as opening doors or dressing herself. She also states she has some discomfort along the left side of her face indicates along her jaw-line today. She denies any numbness. She denies any difficulty with speech. She states yesterday when she was walking she was having trouble using her left leg. She states sometimes her vision is blurred. She states last week she had a headache that was on top of her head. She states she's never had any of these symptoms before.  Patient reports she had stayed in [place] for about 5 months and has been home for about a month. She was concerned she may have malaria however she is not having any episodes of fever or chills.  PCP Dr Sheryle Hail  Past Medical History  Diagnosis Date  . Allergic rhinitis   . Cough   . GERD (gastroesophageal reflux disease)   . Diabetes mellitus   . Hypertension   . Hyperlipidemia   . History of colon polyps 2012    tubular adenoma   . Arthritis   . PONV (postoperative nausea and vomiting)     trouble waking up  . Diastolic CHF, chronic 123XX123  . Neuropathy 10/11/2011    Past Surgical History  Procedure Laterality Date  . Nasal turbinate reduction    . Lymphatic mass surgery     . Cataract extraction      right eye    Family History  Problem Relation Age of Onset  . Asthma Sister   . Colon cancer Neg Hx   . Hypertension Mother     History  Substance Use Topics  . Smoking status: Never Smoker   . Smokeless tobacco: Never Used  . Alcohol Use: No   lives at home Lives alone  [place] History   Grav Para Term Preterm Abortions TAB SAB Ect Mult Living   5 4 4  1 1    4       Review of Systems  All other systems reviewed and are negative.    Allergies  Ace inhibitors  Home Medications   Current Outpatient Rx  Name  Route  Sig  Dispense  Refill  . acetaminophen (TYLENOL) 500 MG tablet   Oral   Take 500 mg by mouth every 6 (six) hours as needed. For pain.         Marland Kitchen albuterol (PROVENTIL HFA;VENTOLIN HFA) 108 (90 BASE) MCG/ACT inhaler   Inhalation   Inhale 2 puffs into the lungs every 6 (six) hours as needed. Shortness of breath   18 g   5   . amitriptyline (ELAVIL) 25 MG tablet   Oral   Take 12.5 mg by mouth at bedtime.         Marland Kitchen  amLODipine (NORVASC) 5 MG tablet   Oral   Take 5 mg by mouth daily.           Marland Kitchen aspirin 81 MG tablet   Oral   Take 81 mg by mouth daily.           . benzonatate (TESSALON) 100 MG capsule      Take 1-2 every 6 hours as needed for cough   90 capsule   4   . Calcium-Vitamin D (CALTRATE 600 PLUS-VIT D PO)   Oral   Take 1 tablet by mouth daily.         Marland Kitchen docusate sodium (COLACE) 100 MG capsule   Oral   Take 100 mg by mouth as needed.         . hydrochlorothiazide (HYDRODIURIL) 25 MG tablet      Decrease dose to 1/2 tablet daily         . metFORMIN (GLUCOPHAGE) 500 MG tablet   Oral   Take 500 mg by mouth 2 (two) times daily.           . pantoprazole (PROTONIX) 40 MG tablet   Oral   Take 1 tablet (40 mg total) by mouth daily.   30 tablet   5   . potassium gluconate 595 MG TABS   Oral   Take 595 mg by mouth daily.         . traMADol (ULTRAM) 50 MG tablet   Oral   Take 1 tablet  (50 mg total) by mouth every 6 (six) hours as needed for pain.   30 tablet   0   . verapamil (CALAN) 80 MG tablet   Oral   Take 80 mg by mouth 2 (two) times daily.          Marland Kitchen EXPIRED: amitriptyline (ELAVIL) 25 MG tablet   Oral   Take 12.5 mg by mouth at bedtime. Use to treat diabetic neuropathy           BP 145/74  Pulse 79  Temp(Src) 98.5 F (36.9 C) (Oral)  Resp 18  SpO2 100%  Vital signs normal    Physical Exam  Nursing note and vitals reviewed. Constitutional: She is oriented to person, place, and time. She appears well-developed and well-nourished.  Non-toxic appearance. She does not appear ill. No distress.  HENT:  Head: Normocephalic and atraumatic.  Right Ear: External ear normal.  Left Ear: External ear normal.  Nose: Nose normal. No mucosal edema or rhinorrhea.  Mouth/Throat: Oropharynx is clear and moist and mucous membranes are normal. No dental abscesses or edematous.  Eyes: Conjunctivae and EOM are normal. Pupils are equal, round, and reactive to light.  Neck: Normal range of motion and full passive range of motion without pain. Neck supple.  Cardiovascular: Normal rate, regular rhythm and normal heart sounds.  Exam reveals no gallop and no friction rub.   No murmur heard. Pulmonary/Chest: Effort normal and breath sounds normal. No respiratory distress. She has no wheezes. She has no rhonchi. She has no rales. She exhibits no tenderness and no crepitus.  Abdominal: Soft. Normal appearance and bowel sounds are normal. She exhibits no distension. There is no tenderness. There is no rebound and no guarding.  Musculoskeletal: Normal range of motion. She exhibits no edema and no tenderness.  Moves all extremities well.   Neurological: She is alert and oriented to person, place, and time. She has normal strength. No cranial nerve deficit.  Patient has intact cranial nerves,  she has no pronator drift, she has equal grips, she has normal motor strength in upper and  lower extremities.  Skin: Skin is warm, dry and intact. No rash noted. No erythema. No pallor.  Psychiatric: She has a normal mood and affect. Her speech is normal and behavior is normal. Her mood appears not anxious.    ED Course  Procedures (including critical care time)  Medications  meclizine (ANTIVERT) tablet 25 mg (25 mg Oral Given 12/22/12 1105)  ondansetron (ZOFRAN) injection 4 mg (4 mg Intravenous Given 12/22/12 1123)  meclizine (ANTIVERT) tablet 25 mg (25 mg Oral Given 12/22/12 1349)  ondansetron (ZOFRAN-ODT) disintegrating tablet 4 mg (4 mg Oral Given 12/22/12 1412)  LORazepam (ATIVAN) tablet 0.5 mg (0.5 mg Oral Given 12/22/12 1412)   Pt still has some dizziness, have discussed the results of her tests including her chronic renal insufficiency which on review of her prior labs she has had since Sept 2012 when her creatinine was 1.33 and in Sept 2013 her creatinine was 1.30. MRI was done because of her other potential neurological symptoms however no stroke was found.  Results for orders placed during the hospital encounter of 12/22/12  CBC      Result Value Range   WBC 4.8  4.0 - 10.5 K/uL   RBC 4.43  3.87 - 5.11 MIL/uL   Hemoglobin 12.6  12.0 - 15.0 g/dL   HCT 35.6 (*) 36.0 - 46.0 %   MCV 80.4  78.0 - 100.0 fL   MCH 28.4  26.0 - 34.0 pg   MCHC 35.4  30.0 - 36.0 g/dL   RDW 12.8  11.5 - 15.5 %   Platelets 215  150 - 400 K/uL  BASIC METABOLIC PANEL      Result Value Range   Sodium 142  135 - 145 mEq/L   Potassium 3.8  3.5 - 5.1 mEq/L   Chloride 102  96 - 112 mEq/L   CO2 30  19 - 32 mEq/L   Glucose, Bld 79  70 - 99 mg/dL   BUN 18  6 - 23 mg/dL   Creatinine, Ser 1.23 (*) 0.50 - 1.10 mg/dL   Calcium 10.2  8.4 - 10.5 mg/dL   GFR calc non Af Amer 45 (*) >90 mL/min   GFR calc Af Amer 52 (*) >90 mL/min  GLUCOSE, CAPILLARY      Result Value Range   Glucose-Capillary 100 (*) 70 - 99 mg/dL   Comment 1 Notify RN    TROPONIN I      Result Value Range   Troponin I <0.30  <0.30  ng/mL  APTT      Result Value Range   aPTT 27  24 - 37 seconds  PROTIME-INR      Result Value Range   Prothrombin Time 13.2  11.6 - 15.2 seconds   INR 1.01  0.00 - 1.49  HEPATIC FUNCTION PANEL      Result Value Range   Total Protein 7.7  6.0 - 8.3 g/dL   Albumin 3.8  3.5 - 5.2 g/dL   AST 18  0 - 37 U/L   ALT 18  0 - 35 U/L   Alkaline Phosphatase 70  39 - 117 U/L   Total Bilirubin 0.2 (*) 0.3 - 1.2 mg/dL   Bilirubin, Direct <0.1  0.0 - 0.3 mg/dL   Indirect Bilirubin NOT CALCULATED  0.3 - 0.9 mg/dL   Laboratory interpretation all normal   Mr Brain Wo Contrast  12/22/2012   *  RADIOLOGY REPORT*  Clinical Data: Dizziness.  Right arm numbness.  Left leg weakness. Left facial pain.  Diabetic hypertensive patient with hyperlipidemia.  MRI HEAD WITHOUT CONTRAST  Technique:  Multiplanar, multiecho pulse sequences of the brain and surrounding structures were obtained according to standard protocol without intravenous contrast.  Comparison: 10/10/2011 CT.  No comparison MR.  Findings: No acute infarct.  No intracranial hemorrhage.  Mild small vessel disease type changes.  Mild global atrophy without hydrocephalus.  No intracranial mass lesion detected on this unenhanced exam.  Major intracranial vascular structures are patent.  Cervical medullary junction, pituitary region and pineal region as well as orbital structures unremarkable.  Minimal paranasal sinus mucosal thickening.  IMPRESSION: No acute infarct.  Mild small vessel disease type changes.  Please see above.   Original Report Authenticated By: Genia Del, M.D.     Date: 12/22/2012  Rate: 78  Rhythm: normal sinus rhythm  QRS Axis: normal  Intervals: PR prolonged  ST/T Wave abnormalities: normal  Conduction Disutrbances:none  Narrative Interpretation:   Old EKG Reviewed: unchanged from 02/19/2012     1. Vertigo   2. Renal insufficiency     New Prescriptions   LORAZEPAM (ATIVAN) 0.5 MG TABLET    Take 1 tablet (0.5 mg total) by  mouth every 8 (eight) hours as needed (dizziness).   MECLIZINE (ANTIVERT) 25 MG TABLET    Take 1 tablet (25 mg total) by mouth 4 (four) times daily as needed for dizziness (May take a second if dizziness is not improving).   ONDANSETRON (ZOFRAN) 4 MG TABLET    Take 1 tablet (4 mg total) by mouth every 6 (six) hours.    Rolland Porter, MD, FACEP   MDM          Janice Norrie, MD 12/22/12 [phone]

## 2012-12-22 NOTE — ED Notes (Signed)
Patient transported to MRI 

## 2012-12-22 NOTE — ED Notes (Signed)
Pt states that she was [place] by the [facility] and became dizzy.  Was escorted over by security.  Pt is nauseated but has not vomited.

## 2012-12-24 ENCOUNTER — Other Ambulatory Visit (HOSPITAL_COMMUNITY): Payer: Self-pay | Admitting: Internal Medicine

## 2012-12-24 ENCOUNTER — Other Ambulatory Visit: Payer: Self-pay | Admitting: Family Medicine

## 2012-12-24 DIAGNOSIS — Z1231 Encounter for screening mammogram for malignant neoplasm of breast: Secondary | ICD-10-CM

## 2012-12-31 ENCOUNTER — Ambulatory Visit: Payer: Medicare Other | Admitting: Critical Care Medicine

## 2013-01-05 ENCOUNTER — Ambulatory Visit (HOSPITAL_COMMUNITY): Payer: Medicare Other | Attending: Internal Medicine

## 2013-01-05 ENCOUNTER — Ambulatory Visit
Admission: RE | Admit: 2013-01-05 | Discharge: 2013-01-05 | Disposition: A | Discharge status: Home / Self Care | Payer: Medicare Other | Source: Ambulatory Visit | Attending: General Practice | Admitting: General Practice

## 2013-01-05 ENCOUNTER — Other Ambulatory Visit: Payer: Self-pay | Admitting: General Practice

## 2013-01-05 DIAGNOSIS — R52 Pain, unspecified: Secondary | ICD-10-CM

## 2013-01-07 ENCOUNTER — Other Ambulatory Visit (HOSPITAL_COMMUNITY): Payer: Self-pay | Admitting: Internal Medicine

## 2013-01-07 ENCOUNTER — Ambulatory Visit (HOSPITAL_COMMUNITY): Payer: Medicare Other

## 2013-01-07 ENCOUNTER — Ambulatory Visit (HOSPITAL_COMMUNITY)
Admission: RE | Admit: 2013-01-07 | Discharge: 2013-01-07 | Disposition: A | Discharge status: Home / Self Care | Payer: Medicare Other | Source: Ambulatory Visit | Attending: Internal Medicine | Admitting: Internal Medicine

## 2013-01-07 DIAGNOSIS — Z1231 Encounter for screening mammogram for malignant neoplasm of breast: Secondary | ICD-10-CM

## 2013-01-10 ENCOUNTER — Ambulatory Visit: Payer: Medicare Other | Admitting: Gastroenterology

## 2013-01-11 ENCOUNTER — Ambulatory Visit (INDEPENDENT_AMBULATORY_CARE_PROVIDER_SITE_OTHER): Payer: Medicare Other | Admitting: Critical Care Medicine

## 2013-01-11 ENCOUNTER — Encounter: Payer: Self-pay | Admitting: Critical Care Medicine

## 2013-01-11 VITALS — BP 132/74 | HR 69 | Temp 98.0°F | Ht 62.0 in | Wt 141.6 lb

## 2013-01-11 DIAGNOSIS — R05 Cough: Secondary | ICD-10-CM

## 2013-01-11 DIAGNOSIS — R059 Cough, unspecified: Secondary | ICD-10-CM

## 2013-01-11 MED ORDER — MOMETASONE FUROATE 50 MCG/ACT NA SUSP: 2.0000 | Freq: Every day | NASAL | Status: DC

## 2013-01-11 NOTE — Progress Notes (Signed)
Subjective:    Patient ID: Paula [name], female    DOB: 1945/09/16, 67 y.o.   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  Saw ENT and was neg w/u Improved with GERD therapy.  01/11/2013 Pt travelled to [name] and was there 5 months. No longer coughing.  Using PPI. Not on nasonex.     Review of Systems Constitutional:   No  weight loss, night sweats,  Fevers, chills, fatigue, lassitude. HEENT:   No headaches,  Difficulty swallowing,  Tooth/dental problems,  Sore throat,                No sneezing, itching, ear ache, nasal congestion, post nasal drip,   CV:  No chest pain,  Orthopnea, PND, swelling in lower extremities, anasarca, dizziness, palpitations  GI  No heartburn, indigestion, abdominal pain, nausea, vomiting, diarrhea, change in bowel habits, loss of appetite  Resp: No shortness of breath with exertion or at rest.  No excess mucus, no productive cough,  No non-productive cough,  No coughing up of blood.  No change in color of mucus.  No wheezing.  No chest wall deformity  Skin: no rash or lesions.  GU: no dysuria, change in color of urine, no urgency or frequency.  No flank pain.  MS:  No joint pain or swelling.  No decreased range of motion.  No back pain.  Psych:  No change in mood or affect. No depression or anxiety.  No memory loss.     Objective:   Physical Exam  Filed Vitals:   01/11/13 1344  BP: 132/74  Pulse: 69  Temp: 98 F (36.7 C)  TempSrc: Oral  Height: 5\' 2"  (1.575 m)  Weight: 64.229 kg (141 lb 9.6 oz)  SpO2: 100%    Gen: Pleasant, well-nourished, in no distress,  normal affect  ENT: No lesions,  mouth clear,  oropharynx clear,                    + postnasal drip  Neck: No JVD, no TMG, no carotid bruits  Lungs: No use of accessory muscles, no dullness to percussion,  Cardiovascular: RRR, heart sounds normal, no murmur or gallops, no peripheral  edema  Abdomen: soft and NT, no HSM,  BS normal  Musculoskeletal: No deformities, no cyanosis or clubbing  Neuro: alert, non focal  Skin: Warm, no lesions or rashes  No results found.        Assessment & Plan:   Cough Cyclical cough essentially resolved.  D/t GERD and pndrip Plan Cont nasonex and PPI GERD diet Prn tessalon rov prn     Updated Medication List Outpatient Encounter Prescriptions as of 01/11/2013  Medication Sig Dispense Refill  . acetaminophen (TYLENOL) 500 MG tablet Take 500 mg by mouth every 6 (six) hours as needed. For pain.      Marland Kitchen albuterol (PROVENTIL HFA;VENTOLIN HFA) 108 (90 BASE) MCG/ACT inhaler Inhale 2 puffs into the lungs every 6 (six) hours as needed. Shortness of breath  18 g  5  . amLODipine (NORVASC) 5 MG tablet Take 5 mg by mouth daily.        Marland Kitchen aspirin 81 MG tablet Take 81 mg by mouth daily.        . Calcium-Vitamin D (CALTRATE 600 PLUS-VIT D PO) Take 1 tablet by mouth. On occasions      . hydrochlorothiazide (HYDRODIURIL) 25 MG tablet Take 25  mg by mouth daily.       . metFORMIN (GLUCOPHAGE) 500 MG tablet Take 500 mg by mouth 2 (two) times daily.        . pantoprazole (PROTONIX) 20 MG tablet Take 20 mg by mouth daily as needed.      . potassium gluconate 595 MG TABS Take 595 mg by mouth daily as needed.       . traMADol (ULTRAM) 50 MG tablet Take 1 tablet (50 mg total) by mouth every 6 (six) hours as needed for pain.  30 tablet  0  . verapamil (CALAN) 80 MG tablet Take 80 mg by mouth 2 (two) times daily.       . mometasone (NASONEX) 50 MCG/ACT nasal spray Place 2 sprays into the nose daily.  17 g  6  . [DISCONTINUED] amitriptyline (ELAVIL) 25 MG tablet Take 12.5 mg by mouth at bedtime. Use to treat diabetic neuropathy      . [DISCONTINUED] amitriptyline (ELAVIL) 25 MG tablet Take 12.5 mg by mouth at bedtime.      . [DISCONTINUED] benzonatate (TESSALON) 100 MG capsule Take 1-2 every 6 hours as needed for cough  90 capsule  4  . [DISCONTINUED]  docusate sodium (COLACE) 100 MG capsule Take 100 mg by mouth as needed.      . [DISCONTINUED] LORazepam (ATIVAN) 0.5 MG tablet Take 1 tablet (0.5 mg total) by mouth every 8 (eight) hours as needed (dizziness).  10 tablet  0  . [DISCONTINUED] meclizine (ANTIVERT) 25 MG tablet Take 1 tablet (25 mg total) by mouth 4 (four) times daily as needed for dizziness (May take a second if dizziness is not improving).  40 tablet  0  . [DISCONTINUED] ondansetron (ZOFRAN) 4 MG tablet Take 1 tablet (4 mg total) by mouth every 6 (six) hours.  12 tablet  0  . [DISCONTINUED] pantoprazole (PROTONIX) 40 MG tablet Take 1 tablet (40 mg total) by mouth daily.  30 tablet  5   No facility-administered encounter medications on file as of 01/11/2013.

## 2013-01-11 NOTE — Patient Instructions (Signed)
Use nasonex two puff each nostril as needed Return 1 year or sooner if needed

## 2013-01-12 NOTE — Assessment & Plan Note (Signed)
Cyclical cough essentially resolved.  D/t GERD and pndrip Plan Cont nasonex and PPI GERD diet Prn tessalon rov prn

## 2013-01-14 ENCOUNTER — Ambulatory Visit: Payer: Medicare Other | Admitting: Critical Care Medicine

## 2013-01-19 ENCOUNTER — Other Ambulatory Visit: Payer: Self-pay | Admitting: Gastroenterology

## 2013-01-23 ENCOUNTER — Emergency Department (HOSPITAL_COMMUNITY)
Admission: EM | Admit: 2013-01-23 | Discharge: 2013-01-23 | Disposition: A | Discharge status: Home / Self Care | Payer: Medicare Other | Attending: Emergency Medicine | Admitting: Emergency Medicine

## 2013-01-23 ENCOUNTER — Emergency Department (HOSPITAL_COMMUNITY): Payer: Medicare Other

## 2013-01-23 ENCOUNTER — Encounter (HOSPITAL_COMMUNITY): Payer: Self-pay

## 2013-01-23 DIAGNOSIS — Z8669 Personal history of other diseases of the nervous system and sense organs: Secondary | ICD-10-CM | POA: Insufficient documentation

## 2013-01-23 DIAGNOSIS — I10 Essential (primary) hypertension: Secondary | ICD-10-CM | POA: Insufficient documentation

## 2013-01-23 DIAGNOSIS — E785 Hyperlipidemia, unspecified: Secondary | ICD-10-CM | POA: Insufficient documentation

## 2013-01-23 DIAGNOSIS — Z860100 Personal history of colon polyps, unspecified: Secondary | ICD-10-CM | POA: Insufficient documentation

## 2013-01-23 DIAGNOSIS — K219 Gastro-esophageal reflux disease without esophagitis: Secondary | ICD-10-CM | POA: Insufficient documentation

## 2013-01-23 DIAGNOSIS — M25572 Pain in left ankle and joints of left foot: Secondary | ICD-10-CM

## 2013-01-23 DIAGNOSIS — E119 Type 2 diabetes mellitus without complications: Secondary | ICD-10-CM | POA: Insufficient documentation

## 2013-01-23 DIAGNOSIS — Z7982 Long term (current) use of aspirin: Secondary | ICD-10-CM | POA: Insufficient documentation

## 2013-01-23 DIAGNOSIS — I5032 Chronic diastolic (congestive) heart failure: Secondary | ICD-10-CM | POA: Insufficient documentation

## 2013-01-23 DIAGNOSIS — M129 Arthropathy, unspecified: Secondary | ICD-10-CM | POA: Insufficient documentation

## 2013-01-23 DIAGNOSIS — M25579 Pain in unspecified ankle and joints of unspecified foot: Secondary | ICD-10-CM | POA: Insufficient documentation

## 2013-01-23 DIAGNOSIS — Z79899 Other long term (current) drug therapy: Secondary | ICD-10-CM | POA: Insufficient documentation

## 2013-01-23 MED ORDER — TRAMADOL HCL 50 MG PO TABS
50.0000 mg | ORAL_TABLET | Freq: Once | ORAL | Status: AC
  Administered 2013-01-23: 50 mg via ORAL
  Filled 2013-01-23: qty 1

## 2013-01-23 MED ORDER — TRAMADOL HCL 50 MG PO TABS: 50.0000 mg | ORAL_TABLET | Freq: Four times a day (QID) | ORAL | Status: DC | PRN

## 2013-01-23 NOTE — ED Provider Notes (Signed)
History     CSN: [id number]  Arrival date & time 01/23/13  [id number]   First MD Initiated Contact with Patient 01/23/13 [phone]      Chief Complaint  Patient presents with  . Leg Pain    (Consider location/radiation/quality/duration/timing/severity/associated sxs/prior treatment) HPI  The patient is a 67 year old female presents to the emergency department for burning nonradiating left ankle pain that started around 4 AM this morning the patient woke up. Patient states she is unable to walk on ankle due to pain. No alleviating factors. No other aggravating factors besides ambulating. Patient rates the pain 10 out of 10. Denies any trauma or injury to left ankle.  Past Medical History  Diagnosis Date  . Allergic rhinitis   . Cough   . GERD (gastroesophageal reflux disease)   . Diabetes mellitus   . Hypertension   . Hyperlipidemia   . History of colon polyps 2012    tubular adenoma   . Arthritis   . PONV (postoperative nausea and vomiting)     trouble waking up  . Diastolic CHF, chronic 123XX123  . Neuropathy 10/11/2011    Past Surgical History  Procedure Laterality Date  . Nasal turbinate reduction    . Lymphatic mass surgery    . Cataract extraction      right eye    Family History  Problem Relation Age of Onset  . Asthma Sister   . Colon cancer Neg Hx   . Hypertension Mother     History  Substance Use Topics  . Smoking status: Never Smoker   . Smokeless tobacco: Never Used  . Alcohol Use: No    OB History   Grav Para Term Preterm Abortions TAB SAB Ect Mult Living   5 4 4  1 1    4       Review of Systems  Constitutional: Negative for fever and chills.  Cardiovascular: Negative for leg swelling.  Musculoskeletal: Positive for myalgias and arthralgias.  Skin: Negative.     Allergies  Ace inhibitors  Home Medications   Current Outpatient Rx  Name  Route  Sig  Dispense  Refill  . amLODipine (NORVASC) 5 MG tablet   Oral   Take 5 mg by mouth every  morning.          . hydrochlorothiazide (HYDRODIURIL) 25 MG tablet   Oral   Take 25 mg by mouth every morning.          . lovastatin (MEVACOR) 20 MG tablet   Oral   Take 20 mg by mouth at bedtime.         . metFORMIN (GLUCOPHAGE) 500 MG tablet   Oral   Take 500 mg by mouth 2 (two) times daily.           . methylcellulose (ARTIFICIAL TEARS) 1 % ophthalmic solution   Both Eyes   Place 1 drop into both eyes 2 (two) times daily as needed (dry eyes). Thera Tears         . mometasone (NASONEX) 50 MCG/ACT nasal spray   Nasal   Place 2 sprays into the nose daily.   17 g   6   . pantoprazole (PROTONIX) 20 MG tablet   Oral   Take 20 mg by mouth every morning.          . traMADol (ULTRAM) 50 MG tablet   Oral   Take 50 mg by mouth 2 (two) times daily.         Marland Kitchen  verapamil (CALAN) 80 MG tablet   Oral   Take 80 mg by mouth 2 (two) times daily.          Marland Kitchen albuterol (PROVENTIL HFA;VENTOLIN HFA) 108 (90 BASE) MCG/ACT inhaler   Inhalation   Inhale 2 puffs into the lungs every 6 (six) hours as needed. Shortness of breath   18 g   5   . aspirin 81 MG tablet   Oral   Take 81 mg by mouth daily.           . calcium-vitamin D (OSCAL WITH D) 500-200 MG-UNIT per tablet   Oral   Take 1 tablet by mouth every morning.         . potassium gluconate 595 MG TABS   Oral   Take 595 mg by mouth daily as needed.          . traMADol (ULTRAM) 50 MG tablet   Oral   Take 1 tablet (50 mg total) by mouth every 6 (six) hours as needed for pain.   15 tablet   0     BP 126/65  Pulse 69  Temp(Src) 97.7 F (36.5 C) (Oral)  Resp 19  SpO2 100%  Physical Exam  Constitutional: She is oriented to person, place, and time. She appears well-developed and well-nourished. No distress.  HENT:  Head: Normocephalic and atraumatic.  Eyes: Conjunctivae are normal.  Neck: Neck supple.  Musculoskeletal: Normal range of motion. She exhibits no edema.       Right ankle: Normal.        Left ankle: She exhibits normal range of motion, no swelling, no ecchymosis, no deformity, no laceration and normal pulse. Tenderness. Medial malleolus tenderness found. No lateral malleolus, no AITFL, no CF ligament, no posterior TFL, no head of 5th metatarsal and no proximal fibula tenderness found.       Right foot: Normal.       Left foot: Normal.       Feet:  Neurological: She is alert and oriented to person, place, and time.  Skin: Skin is warm and dry. She is not diaphoretic.  Psychiatric: She has a normal mood and affect.    ED Course  Procedures (including critical care time)  Labs Reviewed - No data to display Dg Ankle Complete Left  01/23/2013   *RADIOLOGY REPORT*  Clinical Data: Left ankle injury.  Severe ankle pain and inability to bear weight.  LEFT ANKLE COMPLETE - 3+ VIEW  Comparison: None.  Findings: No evidence of fracture or dislocation.  No evidence of ankle joint effusion.  Incidental note is made of a prominent dorsal calcaneal spur.  IMPRESSION: No acute findings.   Original Report Authenticated By: Earle Gell, M.D.     1. Painful ankle, left       MDM  No obvious deformity. Imaging shows no fracture. Directed pt to ice injury, take acetaminophen or ibuprofen for pain, and to elevate and rest the injury when possible. Wrapped ankle for support and comfort. Provided crutches. Advised to f/u w/ PCP. Patient is stable at time of discharge.        Harlow Mares, PA-C 01/23/13 [phone]

## 2013-01-23 NOTE — ED Notes (Signed)
Ice pack given to patient. Ankle wrapped in ace bandage, pt education given for use of crutches

## 2013-01-23 NOTE — ED Notes (Signed)
[id number] Expected date:<BR> Expected time:<BR> Means of arrival:<BR> Comments:<BR>

## 2013-01-23 NOTE — ED Notes (Signed)
Pt here via EMS with c/o leg pain. Pt fell three months ago and these flare-ups of pain happen often. Pt is c/o left leg pain. On EMS arrival, pt says she began feeling nauseous around 4 am but has had no vomiting and is currently asleep on the stretcher.

## 2013-01-24 NOTE — ED Provider Notes (Signed)
Medical screening examination/treatment/procedure(s) were performed by non-physician practitioner and as supervising physician I was immediately available for consultation/collaboration.   Saddie Benders. Abhinav Mayorquin, MD 01/24/13 0930

## 2013-03-11 ENCOUNTER — Telehealth: Payer: Self-pay | Admitting: Critical Care Medicine

## 2013-03-11 MED ORDER — MOMETASONE FUROATE 50 MCG/ACT NA SUSP: 2.0000 | Freq: Every day | NASAL | Status: DC

## 2013-03-11 NOTE — Telephone Encounter (Signed)
Pt aware samples upfront for pick up. Nothing further was needed

## 2013-05-06 ENCOUNTER — Ambulatory Visit (INDEPENDENT_AMBULATORY_CARE_PROVIDER_SITE_OTHER): Payer: Medicare Other | Admitting: Adult Health

## 2013-05-06 ENCOUNTER — Encounter: Payer: Self-pay | Admitting: Adult Health

## 2013-05-06 VITALS — BP 124/76 | HR 72 | Temp 98.3°F | Ht 62.5 in | Wt 146.2 lb

## 2013-05-06 DIAGNOSIS — Z23 Encounter for immunization: Secondary | ICD-10-CM

## 2013-05-06 DIAGNOSIS — J309 Allergic rhinitis, unspecified: Secondary | ICD-10-CM

## 2013-05-06 DIAGNOSIS — J302 Other seasonal allergic rhinitis: Secondary | ICD-10-CM

## 2013-05-06 NOTE — Progress Notes (Signed)
  Subjective:    Patient ID: Paula [name], female    DOB: 03-05-1946, 67 y.o.   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  Saw ENT and was neg w/u Improved with GERD therapy.   05/06/2013 Acute OV  Complains of PND, right ear discomfort, eyes itching, prod cough with clear mucus x1 month - denies f/c/s, hemoptysis.  Has lots of clear nasal drainage , drippy nose,  Sneezing. Ears feel full . Out of nasonex .  Cough is minimal.  Says she forgot what antihistamine to take.     Review of Systems Constitutional:   No  weight loss, night sweats,  Fevers, chills, fatigue, or  lassitude.  HEENT:   No headaches,  Difficulty swallowing,  Tooth/dental problems, or  Sore throat,                No sneezing, itching, ear ache,  +nasal congestion, post nasal drip,   CV:  No chest pain,  Orthopnea, PND, swelling in lower extremities, anasarca, dizziness, palpitations, syncope.   GI  No heartburn, indigestion, abdominal pain, nausea, vomiting, diarrhea, change in bowel habits, loss of appetite, bloody stools.   Resp:   No chest wall deformity  Skin: no rash or lesions.  GU: no dysuria, change in color of urine, no urgency or frequency.  No flank pain, no hematuria   MS:  No joint pain or swelling.  No decreased range of motion.  No back pain.  Psych:  No change in mood or affect. No depression or anxiety.  No memory loss.         Objective:   Physical Exam GEN: A/Ox3; pleasant , NAD, well nourished   HEENT:  [place]/AT,  EACs-clear, TMs-wnl, NOSE-clear drainage  THROAT-clear, no lesions, Clear postnasal drip   NECK:  Supple w/ fair ROM; no JVD; normal carotid impulses w/o bruits; no thyromegaly or nodules palpated; no lymphadenopathy.  RESP  Clear  P & A; w/o, wheezes/ rales/ or rhonchi.no accessory muscle use, no dullness to percussion  CARD:  RRR, no m/r/g  , no peripheral edema, pulses intact, no  cyanosis or clubbing.  GI:   Soft & nt; nml bowel sounds; no organomegaly or masses detected.  Musco: Warm bil, no deformities or joint swelling noted.   Neuro: alert, no focal deficits noted.    Skin: Warm, no lesions or rashes         Assessment & Plan:

## 2013-05-06 NOTE — Assessment & Plan Note (Signed)
Flare   Plan  Begin Allegra 180mg  daily for 2 weeks then daily As needed  For drippy nose/drainage.  Saline nasal rinses As needed   Nasonex 1 puff Twice daily   Use Chlorpheniramine  4mg  2 tabs At bedtime  As needed  For drainage.  Delsym 2 tsp Twice daily  As needed  Cough.  follow up Dr. Joya Gaskins  As planned and As needed   Please contact office for sooner follow up if symptoms do not improve or worsen or seek emergency care  Flu shot today .

## 2013-05-06 NOTE — Patient Instructions (Addendum)
Begin Allegra 180mg  daily for 2 weeks then daily As needed  For drippy nose/drainage.  Saline nasal rinses As needed   Nasonex 1 puff Twice daily   Use Chlorpheniramine  4mg  2 tabs At bedtime  As needed  For drainage.  Delsym 2 tsp Twice daily  As needed  Cough.  follow up Dr. Joya Gaskins  As planned and As needed   Please contact office for sooner follow up if symptoms do not improve or worsen or seek emergency care  Flu shot today .

## 2013-05-19 ENCOUNTER — Other Ambulatory Visit: Payer: Self-pay | Admitting: General Practice

## 2013-05-19 ENCOUNTER — Ambulatory Visit
Admission: RE | Admit: 2013-05-19 | Discharge: 2013-05-19 | Disposition: A | Discharge status: Home / Self Care | Payer: No Typology Code available for payment source | Source: Ambulatory Visit | Attending: General Practice | Admitting: General Practice

## 2013-05-19 DIAGNOSIS — M542 Cervicalgia: Secondary | ICD-10-CM

## 2013-05-19 DIAGNOSIS — M25511 Pain in right shoulder: Secondary | ICD-10-CM

## 2013-05-23 ENCOUNTER — Ambulatory Visit: Payer: No Typology Code available for payment source | Attending: General Practice | Admitting: Physical Therapy

## 2013-05-23 DIAGNOSIS — M25519 Pain in unspecified shoulder: Secondary | ICD-10-CM | POA: Diagnosis not present

## 2013-05-23 DIAGNOSIS — IMO0001 Reserved for inherently not codable concepts without codable children: Secondary | ICD-10-CM | POA: Insufficient documentation

## 2013-05-23 DIAGNOSIS — M542 Cervicalgia: Secondary | ICD-10-CM | POA: Diagnosis not present

## 2013-05-23 DIAGNOSIS — M546 Pain in thoracic spine: Secondary | ICD-10-CM | POA: Insufficient documentation

## 2013-05-25 ENCOUNTER — Ambulatory Visit: Payer: No Typology Code available for payment source | Admitting: Physical Therapy

## 2013-05-25 DIAGNOSIS — IMO0001 Reserved for inherently not codable concepts without codable children: Secondary | ICD-10-CM | POA: Diagnosis not present

## 2013-05-27 ENCOUNTER — Ambulatory Visit: Payer: No Typology Code available for payment source | Admitting: Rehabilitation

## 2013-05-27 DIAGNOSIS — IMO0001 Reserved for inherently not codable concepts without codable children: Secondary | ICD-10-CM | POA: Diagnosis not present

## 2013-05-30 ENCOUNTER — Ambulatory Visit: Payer: No Typology Code available for payment source | Admitting: Physical Therapy

## 2013-05-30 DIAGNOSIS — IMO0001 Reserved for inherently not codable concepts without codable children: Secondary | ICD-10-CM | POA: Diagnosis not present

## 2013-06-01 ENCOUNTER — Ambulatory Visit: Payer: No Typology Code available for payment source | Admitting: Physical Therapy

## 2013-06-01 DIAGNOSIS — IMO0001 Reserved for inherently not codable concepts without codable children: Secondary | ICD-10-CM | POA: Diagnosis not present

## 2013-06-02 ENCOUNTER — Encounter: Payer: Medicare Other | Admitting: Physical Therapy

## 2013-06-03 ENCOUNTER — Ambulatory Visit: Payer: No Typology Code available for payment source | Admitting: Physical Therapy

## 2013-06-06 ENCOUNTER — Ambulatory Visit: Payer: No Typology Code available for payment source | Admitting: Physical Therapy

## 2013-06-06 DIAGNOSIS — IMO0001 Reserved for inherently not codable concepts without codable children: Secondary | ICD-10-CM | POA: Diagnosis not present

## 2013-06-09 ENCOUNTER — Ambulatory Visit: Payer: No Typology Code available for payment source | Admitting: Physical Therapy

## 2013-06-09 DIAGNOSIS — IMO0001 Reserved for inherently not codable concepts without codable children: Secondary | ICD-10-CM | POA: Diagnosis not present

## 2013-06-13 ENCOUNTER — Ambulatory Visit: Payer: No Typology Code available for payment source | Attending: General Practice | Admitting: Physical Therapy

## 2013-06-13 DIAGNOSIS — M25519 Pain in unspecified shoulder: Secondary | ICD-10-CM | POA: Diagnosis not present

## 2013-06-13 DIAGNOSIS — M542 Cervicalgia: Secondary | ICD-10-CM | POA: Diagnosis not present

## 2013-06-13 DIAGNOSIS — M546 Pain in thoracic spine: Secondary | ICD-10-CM | POA: Diagnosis not present

## 2013-06-13 DIAGNOSIS — IMO0001 Reserved for inherently not codable concepts without codable children: Secondary | ICD-10-CM | POA: Insufficient documentation

## 2013-06-16 ENCOUNTER — Ambulatory Visit: Payer: No Typology Code available for payment source | Admitting: Physical Therapy

## 2013-06-16 DIAGNOSIS — IMO0001 Reserved for inherently not codable concepts without codable children: Secondary | ICD-10-CM | POA: Diagnosis not present

## 2013-06-20 ENCOUNTER — Ambulatory Visit: Payer: No Typology Code available for payment source | Admitting: Physical Therapy

## 2013-06-20 DIAGNOSIS — IMO0001 Reserved for inherently not codable concepts without codable children: Secondary | ICD-10-CM | POA: Diagnosis not present

## 2013-06-24 ENCOUNTER — Ambulatory Visit: Payer: No Typology Code available for payment source | Admitting: Physical Therapy

## 2013-06-24 DIAGNOSIS — IMO0001 Reserved for inherently not codable concepts without codable children: Secondary | ICD-10-CM | POA: Diagnosis not present

## 2013-06-27 ENCOUNTER — Ambulatory Visit: Payer: No Typology Code available for payment source | Admitting: Physical Therapy

## 2013-06-27 DIAGNOSIS — IMO0001 Reserved for inherently not codable concepts without codable children: Secondary | ICD-10-CM | POA: Diagnosis not present

## 2013-07-01 ENCOUNTER — Ambulatory Visit: Payer: No Typology Code available for payment source | Admitting: Physical Therapy

## 2013-07-01 DIAGNOSIS — IMO0001 Reserved for inherently not codable concepts without codable children: Secondary | ICD-10-CM | POA: Diagnosis not present

## 2014-06-12 ENCOUNTER — Encounter: Payer: Self-pay | Admitting: Adult Health

## 2014-09-04 ENCOUNTER — Emergency Department (HOSPITAL_COMMUNITY)
Admission: EM | Admit: 2014-09-04 | Discharge: 2014-09-04 | Disposition: A | Discharge status: Home / Self Care | Payer: Medicare Other | Attending: Emergency Medicine | Admitting: Emergency Medicine

## 2014-09-04 ENCOUNTER — Emergency Department (HOSPITAL_COMMUNITY): Payer: Medicare Other

## 2014-09-04 ENCOUNTER — Encounter (HOSPITAL_COMMUNITY): Payer: Self-pay | Admitting: Emergency Medicine

## 2014-09-04 DIAGNOSIS — Z86010 Personal history of colonic polyps: Secondary | ICD-10-CM | POA: Insufficient documentation

## 2014-09-04 DIAGNOSIS — R059 Cough, unspecified: Secondary | ICD-10-CM

## 2014-09-04 DIAGNOSIS — K219 Gastro-esophageal reflux disease without esophagitis: Secondary | ICD-10-CM | POA: Insufficient documentation

## 2014-09-04 DIAGNOSIS — I10 Essential (primary) hypertension: Secondary | ICD-10-CM | POA: Insufficient documentation

## 2014-09-04 DIAGNOSIS — Z8669 Personal history of other diseases of the nervous system and sense organs: Secondary | ICD-10-CM | POA: Insufficient documentation

## 2014-09-04 DIAGNOSIS — R05 Cough: Secondary | ICD-10-CM

## 2014-09-04 DIAGNOSIS — E119 Type 2 diabetes mellitus without complications: Secondary | ICD-10-CM | POA: Insufficient documentation

## 2014-09-04 DIAGNOSIS — Z7951 Long term (current) use of inhaled steroids: Secondary | ICD-10-CM | POA: Insufficient documentation

## 2014-09-04 DIAGNOSIS — I5032 Chronic diastolic (congestive) heart failure: Secondary | ICD-10-CM | POA: Insufficient documentation

## 2014-09-04 DIAGNOSIS — M1990 Unspecified osteoarthritis, unspecified site: Secondary | ICD-10-CM | POA: Insufficient documentation

## 2014-09-04 DIAGNOSIS — J069 Acute upper respiratory infection, unspecified: Secondary | ICD-10-CM | POA: Insufficient documentation

## 2014-09-04 DIAGNOSIS — Z7982 Long term (current) use of aspirin: Secondary | ICD-10-CM | POA: Insufficient documentation

## 2014-09-04 DIAGNOSIS — Z79899 Other long term (current) drug therapy: Secondary | ICD-10-CM | POA: Insufficient documentation

## 2014-09-04 DIAGNOSIS — E785 Hyperlipidemia, unspecified: Secondary | ICD-10-CM | POA: Insufficient documentation

## 2014-09-04 LAB — CBC WITH DIFFERENTIAL/PLATELET
Basophils Absolute: 0 10*3/uL (ref 0.0–0.1)
Basophils Relative: 1 % (ref 0–1)
Eosinophils Absolute: 0.3 10*3/uL (ref 0.0–0.7)
Eosinophils Relative: 4 % (ref 0–5)
HCT: 38.4 % (ref 36.0–46.0)
Hemoglobin: 13.5 g/dL (ref 12.0–15.0)
Lymphocytes Relative: 27 % (ref 12–46)
Lymphs Abs: 1.9 10*3/uL (ref 0.7–4.0)
MCH: 28.9 pg (ref 26.0–34.0)
MCHC: 35.2 g/dL (ref 30.0–36.0)
MCV: 82.2 fL (ref 78.0–100.0)
Monocytes Absolute: 0.6 10*3/uL (ref 0.1–1.0)
Monocytes Relative: 9 % (ref 3–12)
Neutro Abs: 4.1 10*3/uL (ref 1.7–7.7)
Neutrophils Relative %: 59 % (ref 43–77)
Platelets: 201 10*3/uL (ref 150–400)
RBC: 4.67 MIL/uL (ref 3.87–5.11)
RDW: 12.9 % (ref 11.5–15.5)
WBC: 7 10*3/uL (ref 4.0–10.5)

## 2014-09-04 LAB — BASIC METABOLIC PANEL
Anion gap: 12 (ref 5–15)
BUN: 23 mg/dL (ref 6–23)
CO2: 25 mmol/L (ref 19–32)
Calcium: 9.8 mg/dL (ref 8.4–10.5)
Chloride: 105 mmol/L (ref 96–112)
Creatinine, Ser: 1.25 mg/dL — ABNORMAL HIGH (ref 0.50–1.10)
GFR calc Af Amer: 50 mL/min — ABNORMAL LOW (ref >90)
GFR calc non Af Amer: 43 mL/min — ABNORMAL LOW (ref >90)
Glucose, Bld: 158 mg/dL — ABNORMAL HIGH (ref 70–99)
Potassium: 3.2 mmol/L — ABNORMAL LOW (ref 3.5–5.1)
Sodium: 142 mmol/L (ref 135–145)

## 2014-09-04 MED ORDER — TRAMADOL HCL 50 MG PO TABS: 50.0000 mg | ORAL_TABLET | Freq: Four times a day (QID) | ORAL | Status: DC | PRN

## 2014-09-04 MED ORDER — BENZONATATE 100 MG PO CAPS: 100.0000 mg | ORAL_CAPSULE | Freq: Three times a day (TID) | ORAL | Status: DC

## 2014-09-04 NOTE — ED Notes (Signed)
Pt arrived via EMS with report of coughing, fatigue, fever. Pt reported coughing productive and when she coughs too much she gets SOB.

## 2014-09-04 NOTE — ED Provider Notes (Signed)
CSN: [id number]     Arrival date & time 09/04/14  [id number] History   First MD Initiated Contact with Patient 09/04/14 [phone]      Patient is a 69 y.o. female presenting with cough. The history is provided by the patient.  Cough Cough characteristics:  Productive Sputum characteristics:  Clear Severity:  Moderate Onset quality:  Gradual Duration:  4 days Timing:  Constant Progression since onset: It got a little better on sunday but she woke up feeling poorly this am.  She did not sleep well. Chronicity:  New Relieved by:  Nothing Worsened by:  Activity Associated symptoms: chest pain, chills, ear pain, myalgias and sinus congestion   Associated symptoms: no rhinorrhea, no shortness of breath and no wheezing  Fever: no temp measured, felt warm.   Chest pain:    Quality:  Aching (with coughing)   Severity:  Moderate Ear pain:    Location:  Right   Severity:  Moderate Myalgias:    Location:  Generalized   Quality:  Aching   Severity:  Moderate   Timing: Pt has a history of chronic pain, she did run out of her ultram before the storm and could not get it refilled.   Past Medical History  Diagnosis Date  . Allergic rhinitis   . Cough   . GERD (gastroesophageal reflux disease)   . Diabetes mellitus   . Hypertension   . Hyperlipidemia   . History of colon polyps 2012    tubular adenoma   . Arthritis   . PONV (postoperative nausea and vomiting)     trouble waking up  . Diastolic CHF, chronic [id number]  . Neuropathy 10/11/2011   Past Surgical History  Procedure Laterality Date  . Nasal turbinate reduction    . Lymphatic mass surgery    . Cataract extraction      right eye   Family History  Problem Relation Age of Onset  . Asthma Sister   . Colon cancer Neg Hx   . Hypertension Mother    History  Substance Use Topics  . Smoking status: Never Smoker   . Smokeless tobacco: Never Used  . Alcohol Use: No   OB History    Gravida Para Term Preterm AB TAB SAB Ectopic Multiple  Living   5 4 4  1 1    4      Review of Systems  Constitutional: Positive for chills. Fever: no temp measured, felt warm.  HENT: Positive for ear pain. Negative for rhinorrhea.   Respiratory: Positive for cough. Negative for shortness of breath and wheezing.   Cardiovascular: Positive for chest pain.  Musculoskeletal: Positive for myalgias.  All other systems reviewed and are negative.     Allergies  Ace inhibitors  Home Medications   Prior to Admission medications   Medication Sig Start Date End Date Taking? Authorizing Provider  albuterol (PROVENTIL HFA;VENTOLIN HFA) 108 (90 BASE) MCG/ACT inhaler Inhale 2 puffs into the lungs every 6 (six) hours as needed. Shortness of breath 05/26/12   Elsie Stain, MD  amLODipine (NORVASC) 5 MG tablet Take 5 mg by mouth every morning.     Historical Provider, MD  aspirin 81 MG tablet Take 81 mg by mouth daily.      Historical Provider, MD  calcium-vitamin D (OSCAL WITH D) 500-200 MG-UNIT per tablet Take 1 tablet by mouth every morning.    Historical Provider, MD  hydrochlorothiazide (HYDRODIURIL) 25 MG tablet Take 25 mg by mouth every morning.  10/11/11  Venetia Maxon Rama, MD  lovastatin (MEVACOR) 20 MG tablet Take 20 mg by mouth at bedtime.    Historical Provider, MD  metFORMIN (GLUCOPHAGE) 500 MG tablet Take 500 mg by mouth 2 (two) times daily.      Historical Provider, MD  methylcellulose (ARTIFICIAL TEARS) 1 % ophthalmic solution Place 1 drop into both eyes 2 (two) times daily as needed (dry eyes). Thera Tears    Historical Provider, MD  mometasone (NASONEX) 50 MCG/ACT nasal spray Place 2 sprays into the nose daily. 03/11/13   Elsie Stain, MD  pantoprazole (PROTONIX) 20 MG tablet Take 20 mg by mouth every morning.     Historical Provider, MD  potassium gluconate 595 MG TABS Take 595 mg by mouth daily as needed.     Historical Provider, MD  traMADol (ULTRAM) 50 MG tablet Take 1 tablet (50 mg total) by mouth every 6 (six) hours as needed  for pain. 01/23/13   Jennifer L Piepenbrink, PA-C  verapamil (CALAN) 80 MG tablet Take 80 mg by mouth 2 (two) times daily.     Historical Provider, MD   SpO2 100% Physical Exam  Constitutional: She appears well-developed and well-nourished. No distress.  HENT:  Head: Normocephalic and atraumatic.  Right Ear: Tympanic membrane, external ear and ear canal normal.  Left Ear: Tympanic membrane, external ear and ear canal normal.  Eyes: Conjunctivae are normal. Right eye exhibits no discharge. Left eye exhibits no discharge. No scleral icterus.  Neck: Neck supple. No tracheal deviation present.  Cardiovascular: Normal rate, regular rhythm and intact distal pulses.   Pulmonary/Chest: Effort normal and breath sounds normal. No stridor. No respiratory distress. She has no wheezes. She has no rales.  Abdominal: Soft. Bowel sounds are normal. She exhibits no distension. There is no tenderness. There is no rebound and no guarding.  Musculoskeletal: She exhibits no edema or tenderness.  Neurological: She is alert. She has normal strength. No cranial nerve deficit (no facial droop, extraocular movements intact, no slurred speech) or sensory deficit. She exhibits normal muscle tone. She displays no seizure activity. Coordination normal.  Skin: Skin is warm and dry. No rash noted.  Psychiatric: She has a normal mood and affect.  Nursing note and vitals reviewed.   ED Course  Procedures (including critical care time) Labs Review Labs Reviewed  BASIC METABOLIC PANEL - Abnormal; Notable for the following:    Potassium 3.2 (*)    Glucose, Bld 158 (*)    Creatinine, Ser 1.25 (*)    GFR calc non Af Amer 43 (*)    GFR calc Af Amer 50 (*)    All other components within normal limits  CBC WITH DIFFERENTIAL/PLATELET    Imaging Review Dg Chest 2 View  09/04/2014   CLINICAL DATA:  Cough, congestion and Chest comfort. Fever this morning.  EXAM: CHEST  2 VIEW  COMPARISON:  12/25/2011 and 04/21/2011.   FINDINGS: The heart size and mediastinal contours are normal. The lungs are clear. There is no pleural effusion or pneumothorax. No acute osseous findings are identified. There are stable degenerative changes in the thoracic spine and stable mild aortic atherosclerosis.  IMPRESSION: Stable chest.  No active cardiopulmonary process.   Electronically Signed   By: Camie Patience M.D.   On: 09/04/2014 07:47     EKG Interpretation   Date/Time:  Monday September 04 2014 08:37:17 EST Ventricular Rate:  75 PR Interval:  196 QRS Duration: 80 QT Interval:  412 QTC Calculation: 460 R Axis:   -  20 Text Interpretation:  Sinus rhythm Borderline left axis deviation  Borderline T wave abnormalities No significant change since last tracing  except PR interval decreased Confirmed by Joeli Fenner  MD-J, Tamer Baughman [id number]) on  09/04/2014 8:50:08 AM      MDM   Final diagnoses:  Cough  URI, acute    Symptoms are consistent with a simple upper respiratory infection. There is no evidence to suggest pneumonia on my exam. The patient does not appear to have an otitis media. I discussed supportive treatment. I encouraged followup with the primary care doctor next week if symptoms have not resolved. Warning signs and reasons to return to the emergency room were discussed  Also discussed her tramadol which she has been taking for a while.  She is concerned she might be withdrawing from it.  Discussed talking to her doctor about this medication.  Will give her few days of refill until she can see her doctor.      Dorie Rank, MD 09/04/14 [phone]

## 2014-09-04 NOTE — ED Notes (Signed)
Bed: WA03 Expected date: 09/04/14 Expected time: 6:33 AM Means of arrival: Ambulance Comments: Cough, fever

## 2014-09-04 NOTE — Discharge Instructions (Signed)
Cough, Adult   A cough is a reflex. It helps you clear your throat and airways. A cough can help heal your body. A cough can last 2 or 3 weeks (acute) or may last more than 8 weeks (chronic). Some common causes of a cough can include an infection, allergy, or a cold.  HOME CARE  · Only take medicine as told by your doctor.  · If given, take your medicines (antibiotics) as told. Finish them even if you start to feel better.  · Use a cold steam vaporizer or humidifier in your home. This can help loosen thick spit (secretions).  · Sleep so you are almost sitting up (semi-upright). Use pillows to do this. This helps reduce coughing.  · Rest as needed.  · Stop smoking if you smoke.  GET HELP RIGHT AWAY IF:  · You have yellowish-white fluid (pus) in your thick spit.  · Your cough gets worse.  · Your medicine does not reduce coughing, and you are losing sleep.  · You cough up blood.  · You have trouble breathing.  · Your pain gets worse and medicine does not help.  · You have a fever.  MAKE SURE YOU:   · Understand these instructions.  · Will watch your condition.  · Will get help right away if you are not doing well or get worse.  Document Released: 04/10/2011 Document Revised: 12/12/2013 Document Reviewed: 04/10/2011  [employer]® Patient Information ©2015 [employer]. This information is not intended to replace advice given to you by your health care provider. Make sure you discuss any questions you have with your health care provider.

## 2014-12-11 ENCOUNTER — Ambulatory Visit: Payer: Medicare Other | Attending: Internal Medicine

## 2014-12-13 ENCOUNTER — Ambulatory Visit: Payer: Self-pay | Attending: Internal Medicine | Admitting: Internal Medicine

## 2014-12-13 ENCOUNTER — Encounter: Payer: Self-pay | Admitting: Internal Medicine

## 2014-12-13 VITALS — BP 148/81 | HR 76 | Temp 98.3°F | Resp 16 | Ht 63.0 in | Wt 150.0 lb

## 2014-12-13 DIAGNOSIS — E1140 Type 2 diabetes mellitus with diabetic neuropathy, unspecified: Secondary | ICD-10-CM | POA: Insufficient documentation

## 2014-12-13 DIAGNOSIS — I129 Hypertensive chronic kidney disease with stage 1 through stage 4 chronic kidney disease, or unspecified chronic kidney disease: Secondary | ICD-10-CM | POA: Insufficient documentation

## 2014-12-13 DIAGNOSIS — Z7982 Long term (current) use of aspirin: Secondary | ICD-10-CM | POA: Insufficient documentation

## 2014-12-13 DIAGNOSIS — E119 Type 2 diabetes mellitus without complications: Secondary | ICD-10-CM

## 2014-12-13 DIAGNOSIS — K088 Other specified disorders of teeth and supporting structures: Secondary | ICD-10-CM | POA: Insufficient documentation

## 2014-12-13 DIAGNOSIS — K219 Gastro-esophageal reflux disease without esophagitis: Secondary | ICD-10-CM | POA: Insufficient documentation

## 2014-12-13 DIAGNOSIS — M1990 Unspecified osteoarthritis, unspecified site: Secondary | ICD-10-CM | POA: Insufficient documentation

## 2014-12-13 DIAGNOSIS — N183 Chronic kidney disease, stage 3 unspecified: Secondary | ICD-10-CM

## 2014-12-13 DIAGNOSIS — I10 Essential (primary) hypertension: Secondary | ICD-10-CM

## 2014-12-13 DIAGNOSIS — Z79891 Long term (current) use of opiate analgesic: Secondary | ICD-10-CM | POA: Insufficient documentation

## 2014-12-13 DIAGNOSIS — Z86010 Personal history of colonic polyps: Secondary | ICD-10-CM | POA: Insufficient documentation

## 2014-12-13 DIAGNOSIS — Z79899 Other long term (current) drug therapy: Secondary | ICD-10-CM | POA: Insufficient documentation

## 2014-12-13 DIAGNOSIS — L509 Urticaria, unspecified: Secondary | ICD-10-CM | POA: Insufficient documentation

## 2014-12-13 DIAGNOSIS — M79674 Pain in right toe(s): Secondary | ICD-10-CM

## 2014-12-13 DIAGNOSIS — M25571 Pain in right ankle and joints of right foot: Secondary | ICD-10-CM | POA: Insufficient documentation

## 2014-12-13 DIAGNOSIS — E785 Hyperlipidemia, unspecified: Secondary | ICD-10-CM | POA: Insufficient documentation

## 2014-12-13 DIAGNOSIS — M25512 Pain in left shoulder: Secondary | ICD-10-CM | POA: Insufficient documentation

## 2014-12-13 LAB — POCT GLYCOSYLATED HEMOGLOBIN (HGB A1C): Hemoglobin A1C: 6.7

## 2014-12-13 LAB — GLUCOSE, POCT (MANUAL RESULT ENTRY): POC Glucose: 169 mg/dl — AB (ref 70–99)

## 2014-12-13 MED ORDER — FREESTYLE SYSTEM KIT: 1.0000 | PACK | Status: DC | PRN

## 2014-12-13 MED ORDER — FREESTYLE LANCETS MISC: Status: DC

## 2014-12-13 MED ORDER — AMLODIPINE BESYLATE 10 MG PO TABS: 10.0000 mg | ORAL_TABLET | Freq: Every day | ORAL | Status: DC

## 2014-12-13 MED ORDER — GLIPIZIDE ER 2.5 MG PO TB24: 2.5000 mg | ORAL_TABLET | Freq: Every day | ORAL | Status: DC

## 2014-12-13 MED ORDER — GLUCOSE BLOOD VI STRP: ORAL_STRIP | Status: DC

## 2014-12-13 MED ORDER — VERAPAMIL HCL 80 MG PO TABS: 80.0000 mg | ORAL_TABLET | Freq: Two times a day (BID) | ORAL | Status: DC

## 2014-12-13 NOTE — Progress Notes (Signed)
Pt is here to establish care. Pt has a history of HTN and diabetes. Pt states that she has pain in her left arm and her neck for about 6 months. Pt reports having pain in her right great toe.

## 2014-12-13 NOTE — Patient Instructions (Signed)
Stop Metformin due to Kidney function.  Stop hydrochlorothiazide. I have increased your amlodipine to 10 mg. The new prescription of amlodipine is for the new dose of 10 mg.   I want you to return to clinic tomorrow for blood test and I will check you for Gout.  I would also like you to come back in 2 weeks so a nurse can check your blood pressure since we changed the medications   Call me back when you receive orange card and I will put in dental referral

## 2014-12-13 NOTE — Progress Notes (Signed)
Patient ID: Paula [name], female   DOB: 01-16-1946, 69 y.o.   MRN: [id number]  [id number]  [id number]  DOB - 07/14/1946  CC:  Chief Complaint  Patient presents with  . Establish Care       HPI: Paula [name] is a 69 y.o. female here today to establish medical care.  She has a past medical history of HTN, T2DM, HLD, Gerd, CKD, and arthritis.  She presents today to seek care for her chronic diseases. She was previously being seen by a provider in the past who has continued to write her prescriptions because she lost her insurance. She reports that she has been taking Metformin twice per day.  Patient is concerned about hives for the past 6 months. She is unsure exactly what could be causing her to have hives. She states that she stopped taking lovastatin because she thought it was causing her generalized pain. She would like a dental referral for dental pain and mouth odor. She has not been to a dentist in several years. Pain in right great toe described as swollen and sharp pain. She reports neuropathy from years of diabetes but states that this pain is different from her neuropathy pain.  Pain in left arm from shoulder joint to neck. Unable to sleep on arm. Pain is improving slightly. Has been taking tylenol.    Allergies  Allergen Reactions  . Dust Mite Extract Cough  . Ace Inhibitors Cough    chronic cough   Past Medical History  Diagnosis Date  . Allergic rhinitis   . Cough   . GERD (gastroesophageal reflux disease)   . Diabetes mellitus   . Hypertension   . Hyperlipidemia   . History of colon polyps 2012    tubular adenoma   . Arthritis   . PONV (postoperative nausea and vomiting)     trouble waking up  . Diastolic CHF, chronic 11/10/3242  . Neuropathy 10/11/2011   Current Outpatient Prescriptions on File Prior to Visit  Medication Sig Dispense Refill  . acetaminophen (TYLENOL) 500 MG tablet Take 500 mg by mouth every 6 (six) hours as needed for mild pain  or headache.    . albuterol (PROVENTIL HFA;VENTOLIN HFA) 108 (90 BASE) MCG/ACT inhaler Inhale 2 puffs into the lungs every 6 (six) hours as needed. Shortness of breath 18 g 5  . amLODipine (NORVASC) 5 MG tablet Take 5 mg by mouth daily with breakfast.     . aspirin 81 MG tablet Take 81 mg by mouth daily.      . calcium-vitamin D (OSCAL WITH D) 500-200 MG-UNIT per tablet Take 1 tablet by mouth every morning.    Marland Kitchen esomeprazole (NEXIUM) 20 MG capsule Take 20 mg by mouth daily with breakfast.    . hydrochlorothiazide (MICROZIDE) 12.5 MG capsule Take 12.5 mg by mouth daily with breakfast.    . metFORMIN (GLUCOPHAGE) 500 MG tablet Take 500 mg by mouth 2 (two) times daily.      . polyvinyl alcohol (LIQUIFILM TEARS) 1.4 % ophthalmic solution Place 2 drops into both eyes 2 (two) times daily.    . potassium gluconate 595 MG TABS Take 595 mg by mouth daily.     . traMADol (ULTRAM) 50 MG tablet Take 1 tablet (50 mg total) by mouth every 6 (six) hours as needed for moderate pain. 15 tablet 0  . verapamil (CALAN) 80 MG tablet Take 80 mg by mouth 2 (two) times daily.     . benzonatate (TESSALON) 100 MG capsule  Take 1 capsule (100 mg total) by mouth every 8 (eight) hours. (Patient not taking: Reported on 12/13/2014) 21 capsule 0  . Influenza vac split quadrivalent PF (FLUARIX) 0.5 ML injection Inject 0.5 mLs into the muscle once.    . lovastatin (MEVACOR) 20 MG tablet Take 20 mg by mouth at bedtime.    . mometasone (NASONEX) 50 MCG/ACT nasal spray Place 2 sprays into the nose daily. (Patient not taking: Reported on 09/04/2014) 15 g 0  . pantoprazole (PROTONIX) 20 MG tablet Take 20 mg by mouth every morning.      No current facility-administered medications on file prior to visit.   Family History  Problem Relation Age of Onset  . Asthma Sister   . Colon cancer Neg Hx   . Hypertension Mother    History   Social History  . Marital Status: Widowed    Spouse Name: N/A  . Number of Children: 4  . Years of  Education: N/A   Occupational History  . Unemployed    Social History Main Topics  . Smoking status: Never Smoker   . Smokeless tobacco: Never Used  . Alcohol Use: No  . Drug Use: No  . Sexual Activity: Not Currently    Birth Control/ Protection: None   Other Topics Concern  . Not on file   Social History Narrative    Review of Systems: See HPI     Objective:   Filed Vitals:   12/13/14 0955  BP: 148/81  Pulse: 76  Temp: 98.3 F (36.8 C)  Resp: 16    Physical Exam: Constitutional: Patient appears well-developed and well-nourished. No distress. HENT: Normocephalic, atraumatic, External right and left ear normal. Oropharynx is clear and moist.  Eyes: Conjunctivae and EOM are normal. PERRLA, no scleral icterus. Neck: Normal ROM. Neck supple. No JVD. No tracheal deviation. No thyromegaly. CVS: RRR, S1/S2 +, no murmurs, no gallops, no carotid bruit.  Pulmonary: Effort and breath sounds normal, no stridor, rhonchi, wheezes, rales.  Abdominal: Soft. BS +, no distension, tenderness, rebound or guarding.  Musculoskeletal: Normal range of motion. No edema and no tenderness.  Neuro: Alert. Normal reflexes, muscle tone coordination. No cranial nerve deficit. Skin: Skin is warm and dry. No rash noted. Not diaphoretic. No erythema. No pallor. Right great toe swelling  Psychiatric: Normal mood and affect. Behavior, judgment, thought content normal.  Lab Results  Component Value Date   WBC 7.0 09/04/2014   HGB 13.5 09/04/2014   HCT 38.4 09/04/2014   MCV 82.2 09/04/2014   PLT 201 09/04/2014   Lab Results  Component Value Date   CREATININE 1.25* 09/04/2014   BUN 23 09/04/2014   NA 142 09/04/2014   K 3.2* 09/04/2014   CL 105 09/04/2014   CO2 25 09/04/2014    Lab Results  Component Value Date   HGBA1C 6.70 12/13/2014   Lipid Panel     Component Value Date/Time   CHOL  05/16/2008 0500    183        ATP III CLASSIFICATION:  <200     mg/dL   Desirable  200-239   mg/dL   Borderline High  >=240    mg/dL   High   TRIG 132 05/16/2008 0500   HDL 33* 05/16/2008 0500   CHOLHDL 5.5 05/16/2008 0500   VLDL 26 05/16/2008 0500   LDLCALC * 05/16/2008 0500    124        Total Cholesterol/HDL:CHD Risk Coronary Heart Disease Risk Table  Men   Women  1/2 Average Risk   3.4   3.3       Assessment and plan:   Paula [name] was seen today for establish care.  Diagnoses and all orders for this visit:  Type 2 diabetes mellitus without complication Orders: -     Glucose (CBG) -     HgB A1c -     Microalbumin, urine -     COMPLETE METABOLIC PANEL WITH GFR; Future -     glucose monitoring kit (FREESTYLE) monitoring kit; 1 each by Does not apply route as needed. -     glucose blood test strip; Use as instructed -     Lancets (FREESTYLE) lancets; Use as instructed -    Switched to glipiZIDE (GLIPIZIDE XL) 2.5 MG 24 hr tablet; Take 1 tablet (2.5 mg total) by mouth daily with breakfast. For diabetes -     Lipid panel; Future Discontinued Metformin d/t elevated creatinine   Essential hypertension Orders: -    Refilled amLODipine (NORVASC) 10 MG tablet; Take 1 tablet (10 mg total) by mouth daily with breakfast. -    Refilled verapamil (CALAN) 80 MG tablet; Take 1 tablet (80 mg total) by mouth 2 (two) times daily. Stop HCTZ due to potassium wasting  CKD (chronic kidney disease) stage 3, GFR 30-59 ml/min Orders: -     Basic Metabolic Panel; Future Will need a urology referral  Great toe pain, right -     Uric Acid; Future Likely gout, will send allopurinol if needed once labs are resulted  Left shoulder pain Continue tylenol   Return in about 1 day (around 12/14/2014) for Lab Visit, 2 weeks RN-BP check, and 3 mo PCP.      Chari Manning, NP-C [facility] [phone] 12/13/2014, 10:07 AM

## 2014-12-14 ENCOUNTER — Ambulatory Visit: Payer: Self-pay | Attending: Internal Medicine

## 2014-12-14 DIAGNOSIS — N183 Chronic kidney disease, stage 3 unspecified: Secondary | ICD-10-CM

## 2014-12-14 DIAGNOSIS — E119 Type 2 diabetes mellitus without complications: Secondary | ICD-10-CM

## 2014-12-14 LAB — COMPLETE METABOLIC PANEL WITH GFR
ALT: 19 U/L (ref 0–35)
AST: 16 U/L (ref 0–37)
Albumin: 4.2 g/dL (ref 3.5–5.2)
Alkaline Phosphatase: 66 U/L (ref 39–117)
BUN: 19 mg/dL (ref 6–23)
CO2: 26 mEq/L (ref 19–32)
Calcium: 10.1 mg/dL (ref 8.4–10.5)
Chloride: 101 mEq/L (ref 96–112)
Creat: 1.48 mg/dL — ABNORMAL HIGH (ref 0.50–1.10)
GFR, Est African American: 42 mL/min — ABNORMAL LOW
GFR, Est Non African American: 36 mL/min — ABNORMAL LOW
Glucose, Bld: 133 mg/dL — ABNORMAL HIGH (ref 70–99)
Potassium: 4.2 mEq/L (ref 3.5–5.3)
Sodium: 143 mEq/L (ref 135–145)
Total Bilirubin: 0.5 mg/dL (ref 0.2–1.2)
Total Protein: 7.7 g/dL (ref 6.0–8.3)

## 2014-12-14 LAB — LIPID PANEL
Cholesterol: 249 mg/dL — ABNORMAL HIGH (ref 0–200)
HDL: 49 mg/dL (ref >=46)
LDL Cholesterol: 167 mg/dL — ABNORMAL HIGH (ref 0–99)
Total CHOL/HDL Ratio: 5.1 Ratio
Triglycerides: 164 mg/dL — ABNORMAL HIGH (ref <150)
VLDL: 33 mg/dL (ref 0–40)

## 2014-12-14 LAB — BASIC METABOLIC PANEL
BUN: 19 mg/dL (ref 6–23)
CO2: 26 mEq/L (ref 19–32)
Calcium: 10.1 mg/dL (ref 8.4–10.5)
Chloride: 101 mEq/L (ref 96–112)
Creat: 1.48 mg/dL — ABNORMAL HIGH (ref 0.50–1.10)
Glucose, Bld: 133 mg/dL — ABNORMAL HIGH (ref 70–99)
Potassium: 4.2 mEq/L (ref 3.5–5.3)
Sodium: 143 mEq/L (ref 135–145)

## 2014-12-14 LAB — URIC ACID: Uric Acid, Serum: 8.1 mg/dL — ABNORMAL HIGH (ref 2.4–7.0)

## 2014-12-14 LAB — MICROALBUMIN, URINE: Microalb, Ur: 56.7 mg/dL — ABNORMAL HIGH (ref <2.0)

## 2014-12-27 ENCOUNTER — Ambulatory Visit: Payer: Self-pay | Attending: Internal Medicine | Admitting: *Deleted

## 2014-12-27 VITALS — BP 109/67 | HR 73 | Temp 98.4°F | Resp 18

## 2014-12-27 DIAGNOSIS — R7989 Other specified abnormal findings of blood chemistry: Secondary | ICD-10-CM

## 2014-12-27 DIAGNOSIS — R748 Abnormal levels of other serum enzymes: Secondary | ICD-10-CM | POA: Insufficient documentation

## 2014-12-27 MED ORDER — PANTOPRAZOLE SODIUM 20 MG PO TBEC: 20.0000 mg | DELAYED_RELEASE_TABLET | Freq: Every morning | ORAL | Status: DC

## 2014-12-27 MED ORDER — MOMETASONE FUROATE 50 MCG/ACT NA SUSP: 2.0000 | Freq: Every day | NASAL | Status: DC

## 2014-12-27 MED ORDER — ALLOPURINOL 100 MG PO TABS: 100.0000 mg | ORAL_TABLET | Freq: Every day | ORAL | Status: DC

## 2014-12-27 NOTE — Progress Notes (Signed)
Patient presents for BP check after stopping HCTZ Med list reviewed; states taking all meds as directed except unable to take lovastatin due to severe generalized body pain and states nexium does not work for GERD. States does better with protonix. (refill e-scribed to [facility] pharmacy) Discussed need for low sodium diet and using Mrs. Dash as alternative to salt (patient currently using sea salt). Encouraged to choose foods with 5% or less of daily value for sodium. Patient riding stationary bike and following TV exercises 30 minutes per day for exercise Patient denies headaches, blurred vision, SHOB, chest pain  Labs from last OV reviewed; Patient aware to begin allopurinol 100 mg daily to prevent gout flares Patient states she has no gout pain at present Referral to nephrology placed   Filed Vitals:   12/27/14 1044  BP: 109/67  Pulse: 73  Temp: 98.4 F (36.9 C)  Resp: 18    Patient advised to call for med refills at least 7 days before running out so as not to go without.  Patient aware that she is to f/u with PCP 3 months from last visit (Due 03/15/15)  Patient given literature on DASH Eating Plan, Fat and Cholesterol Control Diet, Hypertension, Gout and foods to avoid for gout  Note routed to Covering Provider for alternative to lovastatin

## 2014-12-27 NOTE — Patient Instructions (Signed)
Discussed list of food to avoid ie. Organ meats, game meats, bacon, beef, pork and lamb, anchovies, sardines, herring, mackerel, scallops, gravy, beer Discussed foods that are safe to eat. Ie. Green vegetables, tomatoes, fruits, breads and cereals that are not whole-grain, cheese, eggs, chocolate, coffee, tea, peanut butter and nuts Discussed dairy that may lower risk of gout: low-fat or nonfat milk, low-fat yogurt    Gout Gout is an inflammatory arthritis caused by a buildup of uric acid crystals in the joints. Uric acid is a chemical that is normally present in the blood. When the level of uric acid in the blood is too high it can form crystals that deposit in your joints and tissues. This causes joint redness, soreness, and swelling (inflammation). Repeat attacks are common. Over time, uric acid crystals can form into masses (tophi) near a joint, destroying bone and causing disfigurement. Gout is treatable and often preventable. CAUSES  The disease begins with elevated levels of uric acid in the blood. Uric acid is produced by your body when it breaks down a naturally found substance called purines. Certain foods you eat, such as meats and fish, contain high amounts of purines. Causes of an elevated uric acid level include:  Being passed down from parent to child (heredity).  Diseases that cause increased uric acid production (such as obesity, psoriasis, and certain cancers).  Excessive alcohol use.  Diet, especially diets rich in meat and seafood.  Medicines, including certain cancer-fighting medicines (chemotherapy), water pills (diuretics), and aspirin.  Chronic kidney disease. The kidneys are no longer able to remove uric acid well.  Problems with metabolism. Conditions strongly associated with gout include:  Obesity.  High blood pressure.  High cholesterol.  Diabetes. Not everyone with elevated uric acid levels gets gout. It is not understood why some people get gout and  others do not. Surgery, joint injury, and eating too much of certain foods are some of the factors that can lead to gout attacks. SYMPTOMS   An attack of gout comes on quickly. It causes intense pain with redness, swelling, and warmth in a joint.  Fever can occur.  Often, only one joint is involved. Certain joints are more commonly involved:  Base of the big toe.  Knee.  Ankle.  Wrist.  Finger. Without treatment, an attack usually goes away in a few days to weeks. Between attacks, you usually will not have symptoms, which is different from many other forms of arthritis. DIAGNOSIS  Your caregiver will suspect gout based on your symptoms and exam. In some cases, tests may be recommended. The tests may include:  Blood tests.  Urine tests.  X-rays.  Joint fluid exam. This exam requires a needle to remove fluid from the joint (arthrocentesis). Using a microscope, gout is confirmed when uric acid crystals are seen in the joint fluid. TREATMENT  There are two phases to gout treatment: treating the sudden onset (acute) attack and preventing attacks (prophylaxis).  Treatment of an Acute Attack.  Medicines are used. These include anti-inflammatory medicines or steroid medicines.  An injection of steroid medicine into the affected joint is sometimes necessary.  The painful joint is rested. Movement can worsen the arthritis.  You may use warm or cold treatments on painful joints, depending which works best for you.  Treatment to Prevent Attacks.  If you suffer from frequent gout attacks, your caregiver may advise preventive medicine. These medicines are started after the acute attack subsides. These medicines either help your kidneys eliminate uric acid from  your body or decrease your uric acid production. You may need to stay on these medicines for a very long time.  The early phase of treatment with preventive medicine can be associated with an increase in acute gout attacks. For  this reason, during the first few months of treatment, your caregiver may also advise you to take medicines usually used for acute gout treatment. Be sure you understand your caregiver's directions. Your caregiver may make several adjustments to your medicine dose before these medicines are effective.  Discuss dietary treatment with your caregiver or dietitian. Alcohol and drinks high in sugar and fructose and foods such as meat, poultry, and seafood can increase uric acid levels. Your caregiver or dietitian can advise you on drinks and foods that should be limited. HOME CARE INSTRUCTIONS   Do not take aspirin to relieve pain. This raises uric acid levels.  Only take over-the-counter or prescription medicines for pain, discomfort, or fever as directed by your caregiver.  Rest the joint as much as possible. When in bed, keep sheets and blankets off painful areas.  Keep the affected joint raised (elevated).  Apply warm or cold treatments to painful joints. Use of warm or cold treatments depends on which works best for you.  Use crutches if the painful joint is in your leg.  Drink enough fluids to keep your urine clear or pale yellow. This helps your body get rid of uric acid. Limit alcohol, sugary drinks, and fructose drinks.  Follow your dietary instructions. Pay careful attention to the amount of protein you eat. Your daily diet should emphasize fruits, vegetables, whole grains, and fat-free or low-fat milk products. Discuss the use of coffee, vitamin C, and cherries with your caregiver or dietitian. These may be helpful in lowering uric acid levels.  Maintain a healthy body weight. SEEK MEDICAL CARE IF:   You develop diarrhea, vomiting, or any side effects from medicines.  You do not feel better in 24 hours, or you are getting worse. SEEK IMMEDIATE MEDICAL CARE IF:   Your joint becomes suddenly more tender, and you have chills or a fever. MAKE SURE YOU:   Understand these  instructions.  Will watch your condition.  Will get help right away if you are not doing well or get worse. Document Released: 07/25/2000 Document Revised: 12/12/2013 Document Reviewed: 03/10/2012 [facility] Patient Information 2015 [place], [place]. This information is not intended to replace advice given to you by your health care provider. Make sure you discuss any questions you have with your health care provider. Fat and Cholesterol Control Diet Fat and cholesterol levels in your blood and organs are influenced by your diet. High levels of fat and cholesterol may lead to diseases of the heart, small and large blood vessels, gallbladder, liver, and pancreas. CONTROLLING FAT AND CHOLESTEROL WITH DIET Although exercise and lifestyle factors are important, your diet is key. That is because certain foods are known to raise cholesterol and others to lower it. The goal is to balance foods for their effect on cholesterol and more importantly, to replace saturated and trans fat with other types of fat, such as monounsaturated fat, polyunsaturated fat, and omega-3 fatty acids. On average, a person should consume no more than 15 to 17 g of saturated fat daily. Saturated and trans fats are considered "bad" fats, and they will raise LDL cholesterol. Saturated fats are primarily found in animal products such as meats, butter, and cream. However, that does not mean you need to give up all your  favorite foods. Today, there are good tasting, low-fat, low-cholesterol substitutes for most of the things you like to eat. Choose low-fat or nonfat alternatives. Choose round or loin cuts of red meat. These types of cuts are lowest in fat and cholesterol. Chicken (without the skin), fish, veal, and ground [place] breast are great choices. Eliminate fatty meats, such as hot dogs and salami. Even shellfish have little or no saturated fat. Have a 3 oz (85 g) portion when you eat lean meat, poultry, or fish. Trans fats are also  called "partially hydrogenated oils." They are oils that have been scientifically manipulated so that they are solid at room temperature resulting in a longer shelf life and improved taste and texture of foods in which they are added. Trans fats are found in stick margarine, some tub margarines, cookies, crackers, and baked goods.  When baking and cooking, oils are a great substitute for butter. The monounsaturated oils are especially beneficial since it is believed they lower LDL and raise HDL. The oils you should avoid entirely are saturated tropical oils, such as coconut and palm.  Remember to eat a lot from food groups that are naturally free of saturated and trans fat, including fish, fruit, vegetables, beans, grains (barley, rice, couscous, bulgur wheat), and pasta (without cream sauces).  IDENTIFYING FOODS THAT LOWER FAT AND CHOLESTEROL  Soluble fiber may lower your cholesterol. This type of fiber is found in fruits such as apples, vegetables such as broccoli, potatoes, and carrots, legumes such as beans, peas, and lentils, and grains such as barley. Foods fortified with plant sterols (phytosterol) may also lower cholesterol. You should eat at least 2 g per day of these foods for a cholesterol lowering effect.  Read package labels to identify low-saturated fats, trans fat free, and low-fat foods at the supermarket. Select cheeses that have only 2 to 3 g saturated fat per ounce. Use a heart-healthy tub margarine that is free of trans fats or partially hydrogenated oil. When buying baked goods (cookies, crackers), avoid partially hydrogenated oils. Breads and muffins should be made from whole grains (whole-wheat or whole oat flour, instead of "flour" or "enriched flour"). Buy non-creamy canned soups with reduced salt and no added fats.  FOOD PREPARATION TECHNIQUES  Never deep-fry. If you must fry, either stir-fry, which uses very little fat, or use non-stick cooking sprays. When possible, broil, bake, or  roast meats, and steam vegetables. Instead of putting butter or margarine on vegetables, use lemon and herbs, applesauce, and cinnamon (for squash and sweet potatoes). Use nonfat yogurt, salsa, and low-fat dressings for salads.  LOW-SATURATED FAT / LOW-FAT FOOD SUBSTITUTES Meats / Saturated Fat (g)  Avoid: Steak, marbled (3 oz/85 g) / 11 g  Choose: Steak, lean (3 oz/85 g) / 4 g  Avoid: Hamburger (3 oz/85 g) / 7 g  Choose: Hamburger, lean (3 oz/85 g) / 5 g  Avoid: Ham (3 oz/85 g) / 6 g  Choose: Ham, lean cut (3 oz/85 g) / 2.4 g  Avoid: Chicken, with skin, dark meat (3 oz/85 g) / 4 g  Choose: Chicken, skin removed, dark meat (3 oz/85 g) / 2 g  Avoid: Chicken, with skin, light meat (3 oz/85 g) / 2.5 g  Choose: Chicken, skin removed, light meat (3 oz/85 g) / 1 g Dairy / Saturated Fat (g)  Avoid: Whole milk (1 cup) / 5 g  Choose: Low-fat milk, 2% (1 cup) / 3 g  Choose: Low-fat milk, 1% (1 cup) / 1.5 g  Choose: Skim milk (1 cup) / 0.3 g  Avoid: Hard cheese (1 oz/28 g) / 6 g  Choose: Skim milk cheese (1 oz/28 g) / 2 to 3 g  Avoid: [place], 4% fat (1 cup) / 6.5 g  Choose: Low-fat cottage cheese, 1% fat (1 cup) / 1.5 g  Avoid: Ice cream (1 cup) / 9 g  Choose: Sherbet (1 cup) / 2.5 g  Choose: Nonfat frozen yogurt (1 cup) / 0.3 g  Choose: Frozen fruit bar / trace  Avoid: Whipped cream (1 tbs) / 3.5 g  Choose: Nondairy whipped topping (1 tbs) / 1 g Condiments / Saturated Fat (g)  Avoid: Mayonnaise (1 tbs) / 2 g  Choose: Low-fat mayonnaise (1 tbs) / 1 g  Avoid: Butter (1 tbs) / 7 g  Choose: Extra light margarine (1 tbs) / 1 g  Avoid: Coconut oil (1 tbs) / 11.8 g  Choose: Olive oil (1 tbs) / 1.8 g  Choose: Corn oil (1 tbs) / 1.7 g  Choose: Safflower oil (1 tbs) / 1.2 g  Choose: Sunflower oil (1 tbs) / 1.4 g  Choose: Soybean oil (1 tbs) / 2.4 g  Choose: Canola oil (1 tbs) / 1 g Document Released: 07/28/2005 Document Revised: 11/22/2012 Document  Reviewed: 10/26/2013 [employer] Patient Information 2015 [place], [place]. This information is not intended to replace advice given to you by your health care provider. Make sure you discuss any questions you have with your health care provider. DASH Eating Plan DASH stands for "Dietary Approaches to Stop Hypertension." The DASH eating plan is a healthy eating plan that has been shown to reduce high blood pressure (hypertension). Additional health benefits may include reducing the risk of type 2 diabetes mellitus, heart disease, and stroke. The DASH eating plan may also help with weight loss. WHAT DO I NEED TO KNOW ABOUT THE DASH EATING PLAN? For the DASH eating plan, you will follow these general guidelines:  Choose foods with a percent daily value for sodium of less than 5% (as listed on the food label).  Use salt-free seasonings or herbs instead of table salt or sea salt.  Check with your health care provider or pharmacist before using salt substitutes.  Eat lower-sodium products, often labeled as "lower sodium" or "no salt added."  Eat fresh foods.  Eat more vegetables, fruits, and low-fat dairy products.  Choose whole grains. Look for the word "whole" as the first word in the ingredient list.  Choose fish and skinless chicken or [place] more often than red meat. Limit fish, poultry, and meat to 6 oz (170 g) each day.  Limit sweets, desserts, sugars, and sugary drinks.  Choose heart-healthy fats.  Limit cheese to 1 oz (28 g) per day.  Eat more home-cooked food and less restaurant, buffet, and fast food.  Limit fried foods.  Cook foods using methods other than frying.  Limit canned vegetables. If you do use them, rinse them well to decrease the sodium.  When eating at a restaurant, ask that your food be prepared with less salt, or no salt if possible. WHAT FOODS CAN I EAT? Seek help from a dietitian for individual calorie needs. Grains Whole grain or whole wheat bread. Brown  rice. Whole grain or whole wheat pasta. Quinoa, bulgur, and whole grain cereals. Low-sodium cereals. Corn or whole wheat flour tortillas. Whole grain cornbread. Whole grain crackers. Low-sodium crackers. Vegetables Fresh or frozen vegetables (raw, steamed, roasted, or grilled). Low-sodium or reduced-sodium tomato and vegetable juices. Low-sodium  or reduced-sodium tomato sauce and paste. Low-sodium or reduced-sodium canned vegetables.  Fruits All fresh, canned (in natural juice), or frozen fruits. Meat and Other Protein Products Ground beef (85% or leaner), grass-fed beef, or beef trimmed of fat. Skinless chicken or [place]. Ground chicken or [place]. Pork trimmed of fat. All fish and seafood. Eggs. Dried beans, peas, or lentils. Unsalted nuts and seeds. Unsalted canned beans. Dairy Low-fat dairy products, such as skim or 1% milk, 2% or reduced-fat cheeses, low-fat ricotta or cottage cheese, or plain low-fat yogurt. Low-sodium or reduced-sodium cheeses. Fats and Oils Tub margarines without trans fats. Light or reduced-fat mayonnaise and salad dressings (reduced sodium). Avocado. Safflower, olive, or canola oils. Natural peanut or almond butter. Other Unsalted popcorn and pretzels. The items listed above may not be a complete list of recommended foods or beverages. Contact your dietitian for more options. WHAT FOODS ARE NOT RECOMMENDED? Grains White bread. White pasta. White rice. Refined cornbread. Bagels and croissants. Crackers that contain trans fat. Vegetables Creamed or fried vegetables. Vegetables in a cheese sauce. Regular canned vegetables. Regular canned tomato sauce and paste. Regular tomato and vegetable juices. Fruits Dried fruits. Canned fruit in light or heavy syrup. Fruit juice. Meat and Other Protein Products Fatty cuts of meat. Ribs, chicken wings, bacon, sausage, bologna, salami, chitterlings, fatback, hot dogs, bratwurst, and packaged luncheon meats. Salted nuts and seeds.  Canned beans with salt. Dairy Whole or 2% milk, cream, half-and-half, and cream cheese. Whole-fat or sweetened yogurt. Full-fat cheeses or blue cheese. Nondairy creamers and whipped toppings. Processed cheese, cheese spreads, or cheese curds. Condiments Onion and garlic salt, seasoned salt, table salt, and sea salt. Canned and packaged gravies. Worcestershire sauce. Tartar sauce. Barbecue sauce. Teriyaki sauce. Soy sauce, including reduced sodium. Steak sauce. Fish sauce. Oyster sauce. Cocktail sauce. Horseradish. Ketchup and mustard. Meat flavorings and tenderizers. Bouillon cubes. Hot sauce. Tabasco sauce. Marinades. Taco seasonings. Relishes. Fats and Oils Butter, stick margarine, lard, shortening, ghee, and bacon fat. Coconut, palm kernel, or palm oils. Regular salad dressings. Other Pickles and olives. Salted popcorn and pretzels. The items listed above may not be a complete list of foods and beverages to avoid. Contact your dietitian for more information. WHERE CAN I FIND MORE INFORMATION? National Heart, Lung, and Blood Institute: travelstabloid.com Document Released: 07/17/2011 Document Revised: 12/12/2013 Document Reviewed: 06/01/2013 [facility] Patient Information 2015 [place], [place]. This information is not intended to replace advice given to you by your health care provider. Make sure you discuss any questions you have with your health care provider.

## 2015-01-10 ENCOUNTER — Telehealth: Payer: Self-pay | Admitting: Internal Medicine

## 2015-01-10 DIAGNOSIS — E785 Hyperlipidemia, unspecified: Secondary | ICD-10-CM

## 2015-01-10 NOTE — Telephone Encounter (Signed)
Patient has come in today to see if she can have a review of her BP medication; patient mentioned she was on a BP medication before and it was DC'd by PCP;

## 2015-01-16 NOTE — Telephone Encounter (Signed)
Please find out what patient needs. I d/c'ed her HCTZ in May and then she came back for a BP check with Lauren. At that time her BP looked great and I wanted her to stay on the current regimen she is already on. Only thing I saw from Lauren's note is that she needed cholesterol medication, find out what reaction she has to lovastatin

## 2015-01-16 NOTE — Telephone Encounter (Signed)
Please see previous note.

## 2015-01-24 MED ORDER — ROSUVASTATIN CALCIUM 5 MG PO TABS: 5.0000 mg | ORAL_TABLET | Freq: Every day | ORAL | Status: DC

## 2015-01-24 NOTE — Telephone Encounter (Signed)
Patient reports myalgia taking the lovastatin.  Per verbal order I am sending a prescription for Crestor 5 mg daily to her [place] [address]  Patient understands and was told to begin taking immediately and if she has problems to let us know.  Patient verbalized understanding.

## 2015-01-24 NOTE — Telephone Encounter (Signed)
-----   Message from Lance Bosch, NP sent at 01/19/2015 12:18 PM EDT ----- Regarding: call patient Please call patient and find out what her intolerance to lovastatin is. If it is just itchy or myalgias then please switch her to Crestor 5 mg daily. If it is a serious reaction please see me before doing anything. Thanks

## 2015-02-02 ENCOUNTER — Ambulatory Visit: Payer: Self-pay | Attending: Internal Medicine | Admitting: Internal Medicine

## 2015-02-02 VITALS — BP 154/78 | HR 71 | Temp 97.8°F | Resp 16 | Wt 152.4 lb

## 2015-02-02 DIAGNOSIS — E785 Hyperlipidemia, unspecified: Secondary | ICD-10-CM

## 2015-02-02 DIAGNOSIS — K088 Other specified disorders of teeth and supporting structures: Secondary | ICD-10-CM

## 2015-02-02 DIAGNOSIS — M545 Low back pain, unspecified: Secondary | ICD-10-CM

## 2015-02-02 DIAGNOSIS — E119 Type 2 diabetes mellitus without complications: Secondary | ICD-10-CM

## 2015-02-02 DIAGNOSIS — K0889 Other specified disorders of teeth and supporting structures: Secondary | ICD-10-CM

## 2015-02-02 LAB — GLUCOSE, POCT (MANUAL RESULT ENTRY): POC Glucose: 82 mg/dl (ref 70–99)

## 2015-02-02 MED ORDER — TRAMADOL HCL 50 MG PO TABS: 50.0000 mg | ORAL_TABLET | Freq: Two times a day (BID) | ORAL | Status: DC | PRN

## 2015-02-02 MED ORDER — ROSUVASTATIN CALCIUM 5 MG PO TABS: 5.0000 mg | ORAL_TABLET | Freq: Every day | ORAL | Status: DC

## 2015-02-02 NOTE — Progress Notes (Signed)
Pt present today for a Dental Referral and refill on Tramdol.
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
## 2015-02-06 ENCOUNTER — Encounter: Payer: Self-pay | Admitting: Internal Medicine

## 2015-02-09 ENCOUNTER — Encounter (HOSPITAL_COMMUNITY): Payer: Self-pay | Admitting: *Deleted

## 2015-02-09 ENCOUNTER — Emergency Department (INDEPENDENT_AMBULATORY_CARE_PROVIDER_SITE_OTHER)
Admission: EM | Admit: 2015-02-09 | Discharge: 2015-02-09 | Disposition: A | Discharge status: Home / Self Care | Payer: Self-pay | Source: Home / Self Care | Attending: Internal Medicine | Admitting: Internal Medicine

## 2015-02-09 DIAGNOSIS — S46812A Strain of other muscles, fascia and tendons at shoulder and upper arm level, left arm, initial encounter: Secondary | ICD-10-CM

## 2015-02-09 DIAGNOSIS — S161XXA Strain of muscle, fascia and tendon at neck level, initial encounter: Secondary | ICD-10-CM

## 2015-02-09 MED ORDER — DICLOFENAC SODIUM 1 % TD GEL: 1.0000 "application " | Freq: Four times a day (QID) | TRANSDERMAL | Status: DC
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
## 2015-02-09 NOTE — Discharge Instructions (Signed)
Muscle Strain A muscle strain (pulled muscle) happens when a muscle is stretched beyond normal length. It happens when a sudden, violent force stretches your muscle too far. Usually, a few of the fibers in your muscle are torn. Muscle strain is common in athletes. Recovery usually takes 1-2 weeks. Complete healing takes 5-6 weeks.  HOME CARE   Follow the PRICE method of treatment to help your injury get better. Do this the first 2-3 days after the injury:  Protect. Protect the muscle to keep it from getting injured again.  Rest. Limit your activity and rest the injured body part.  Ice. Put ice in a plastic bag. Place a towel between your skin and the bag. Then, apply the ice and leave it on from 15-20 minutes each hour. After the third day, switch to moist heat packs.  Compression. Use a splint or elastic bandage on the injured area for comfort. Do not put it on too tightly.  Elevate. Keep the injured body part above the level of your heart.  Only take medicine as told by your doctor.  Warm up before doing exercise to prevent future muscle strains. GET HELP IF:   You have more pain or puffiness (swelling) in the injured area.  You feel numbness, tingling, or notice a loss of strength in the injured area. MAKE SURE YOU:   Understand these instructions.  Will watch your condition.  Will get help right away if you are not doing well or get worse. Document Released: 05/06/2008 Document Revised: 05/18/2013 Document Reviewed: 02/24/2013 [facility] Patient Information 2015 [place], [place]. This information is not intended to replace advice given to you by your health care provider. Make sure you discuss any questions you have with your health care provider.  Cervical Strain and Sprain (Whiplash) with Rehab Cervical strain and sprain are injuries that commonly occur with "whiplash" injuries. Whiplash occurs when the neck is forcefully whipped backward or forward, such as during a motor  vehicle accident or during contact sports. The muscles, ligaments, tendons, discs, and nerves of the neck are susceptible to injury when this occurs. RISK FACTORS Risk of having a whiplash injury increases if:  Osteoarthritis of the spine.  Situations that make head or neck accidents or trauma more likely.  High-risk sports (football, rugby, wrestling, hockey, auto racing, gymnastics, diving, contact karate, or boxing).  Poor strength and flexibility of the neck.  Previous neck injury.  Poor tackling technique.  Improperly fitted or padded equipment. SYMPTOMS   Pain or stiffness in the front or back of neck or both.  Symptoms may present immediately or up to 24 hours after injury.  Dizziness, headache, nausea, and vomiting.  Muscle spasm with soreness and stiffness in the neck.  Tenderness and swelling at the injury site. PREVENTION  Learn and use proper technique (avoid tackling with the head, spearing, and head-butting; use proper falling techniques to avoid landing on the head).  Warm up and stretch properly before activity.  Maintain physical fitness:  Strength, flexibility, and endurance.  Cardiovascular fitness.  Wear properly fitted and padded protective equipment, such as padded soft collars, for participation in contact sports. PROGNOSIS  Recovery from cervical strain and sprain injuries is dependent on the extent of the injury. These injuries are usually curable in 1 week to 3 months with appropriate treatment.  RELATED COMPLICATIONS   Temporary numbness and weakness may occur if the nerve roots are damaged, and this may persist until the nerve has completely healed.  Chronic pain due  to frequent recurrence of symptoms.  Prolonged healing, especially if activity is resumed too soon (before complete recovery). TREATMENT  Treatment initially involves the use of ice and medication to help reduce pain and inflammation. It is also important to perform  strengthening and stretching exercises and modify activities that worsen symptoms so the injury does not get worse. These exercises may be performed at home or with a therapist. For patients who experience severe symptoms, a soft, padded collar may be recommended to be worn around the neck.  Improving your posture may help reduce symptoms. Posture improvement includes pulling your chin and abdomen in while sitting or standing. If you are sitting, sit in a firm chair with your buttocks against the back of the chair. While sleeping, try replacing your pillow with a small towel rolled to 2 inches in diameter, or use a cervical pillow or soft cervical collar. Poor sleeping positions delay healing.  For patients with nerve root damage, which causes numbness or weakness, the use of a cervical traction apparatus may be recommended. Surgery is rarely necessary for these injuries. However, cervical strain and sprains that are present at birth (congenital) may require surgery. MEDICATION   If pain medication is necessary, nonsteroidal anti-inflammatory medications, such as aspirin and ibuprofen, or other minor pain relievers, such as acetaminophen, are often recommended.  Do not take pain medication for 7 days before surgery.  Prescription pain relievers may be given if deemed necessary by your caregiver. Use only as directed and only as much as you need. HEAT AND COLD:   Cold treatment (icing) relieves pain and reduces inflammation. Cold treatment should be applied for 10 to 15 minutes every 2 to 3 hours for inflammation and pain and immediately after any activity that aggravates your symptoms. Use ice packs or an ice massage.  Heat treatment may be used prior to performing the stretching and strengthening activities prescribed by your caregiver, physical therapist, or athletic trainer. Use a heat pack or a warm soak. SEEK MEDICAL CARE IF:   Symptoms get worse or do not improve in 2 weeks despite  treatment.  New, unexplained symptoms develop (drugs used in treatment may produce side effects).

## 2015-02-15 ENCOUNTER — Ambulatory Visit (HOSPITAL_BASED_OUTPATIENT_CLINIC_OR_DEPARTMENT_OTHER): Payer: Self-pay | Admitting: Internal Medicine

## 2015-02-15 ENCOUNTER — Ambulatory Visit: Payer: Self-pay | Attending: Internal Medicine

## 2015-02-15 ENCOUNTER — Encounter: Payer: Self-pay | Admitting: Internal Medicine

## 2015-02-15 VITALS — BP 138/78 | HR 68 | Temp 98.2°F | Ht 63.0 in | Wt 150.0 lb

## 2015-02-15 DIAGNOSIS — Z8669 Personal history of other diseases of the nervous system and sense organs: Secondary | ICD-10-CM

## 2015-02-15 DIAGNOSIS — I10 Essential (primary) hypertension: Secondary | ICD-10-CM | POA: Insufficient documentation

## 2015-02-15 DIAGNOSIS — E119 Type 2 diabetes mellitus without complications: Secondary | ICD-10-CM | POA: Insufficient documentation

## 2015-02-15 DIAGNOSIS — R52 Pain, unspecified: Secondary | ICD-10-CM | POA: Insufficient documentation

## 2015-02-15 LAB — GLUCOSE, POCT (MANUAL RESULT ENTRY): POC Glucose: 121 mg/dl — AB (ref 70–99)

## 2015-02-15 MED ORDER — MOMETASONE FUROATE 50 MCG/ACT NA SUSP: 2.0000 | Freq: Every day | NASAL | Status: DC

## 2015-02-15 MED ORDER — GLIPIZIDE ER 2.5 MG PO TB24: 2.5000 mg | ORAL_TABLET | Freq: Every day | ORAL | Status: DC

## 2015-02-15 MED ORDER — VERAPAMIL HCL 80 MG PO TABS: 80.0000 mg | ORAL_TABLET | Freq: Two times a day (BID) | ORAL | Status: DC

## 2015-02-15 MED ORDER — AMLODIPINE BESYLATE 10 MG PO TABS: 10.0000 mg | ORAL_TABLET | Freq: Every day | ORAL | Status: DC

## 2015-02-15 NOTE — Patient Instructions (Signed)
Continue to use Voltaren Gel. If no improvement in 6 weeks I will place referral to Orthopedics
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
## 2015-02-23 ENCOUNTER — Other Ambulatory Visit: Payer: Self-pay | Admitting: Internal Medicine

## 2015-02-23 DIAGNOSIS — E785 Hyperlipidemia, unspecified: Secondary | ICD-10-CM

## 2015-02-23 MED ORDER — ROSUVASTATIN CALCIUM 5 MG PO TABS: 5.0000 mg | ORAL_TABLET | Freq: Every day | ORAL | Status: DC

## 2015-02-27 DIAGNOSIS — Z8669 Personal history of other diseases of the nervous system and sense organs: Secondary | ICD-10-CM | POA: Insufficient documentation

## 2015-02-27 HISTORY — DX: Personal history of other diseases of the nervous system and sense organs: Z86.69

## 2015-03-01 ENCOUNTER — Ambulatory Visit: Payer: Self-pay | Attending: Internal Medicine | Admitting: Physical Therapy

## 2015-03-01 DIAGNOSIS — M545 Low back pain, unspecified: Secondary | ICD-10-CM

## 2015-03-01 DIAGNOSIS — M542 Cervicalgia: Secondary | ICD-10-CM | POA: Insufficient documentation

## 2015-03-01 DIAGNOSIS — M79605 Pain in left leg: Secondary | ICD-10-CM | POA: Insufficient documentation
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
## 2015-03-07 ENCOUNTER — Encounter: Payer: Self-pay | Admitting: Physical Therapy

## 2015-03-07 ENCOUNTER — Ambulatory Visit: Payer: Self-pay | Admitting: Physical Therapy

## 2015-03-07 DIAGNOSIS — M545 Low back pain, unspecified: Secondary | ICD-10-CM

## 2015-03-07 DIAGNOSIS — M542 Cervicalgia: Secondary | ICD-10-CM

## 2015-03-07 NOTE — Therapy (Signed)
Paula [name], [place], [address] Phone: [phone]   Fax:  [phone]  Physical Therapy Treatment  Patient Details  Name: Paula [name] MRN: [id number] Date of Birth: 12-Oct-1945 Referring Provider:  Tresa Garter, MD  Encounter Date: 03/07/2015      PT End of Session - 03/07/15 1022    Visit Number 2   Date for PT Re-Evaluation 05/02/15   PT Start Time [id number]   PT Stop Time 1055   PT Time Calculation (min) 40 min      Past Medical History  Diagnosis Date  . Allergic rhinitis   . Cough   . GERD (gastroesophageal reflux disease)   . Diabetes mellitus   . Hypertension   . Hyperlipidemia   . History of colon polyps 2012    tubular adenoma   . Arthritis   . PONV (postoperative nausea and vomiting)     trouble waking up  . Diastolic CHF, chronic 123XX123  . Neuropathy 10/11/2011    Past Surgical History  Procedure Laterality Date  . Nasal turbinate reduction    . Lymphatic mass surgery    . Cataract extraction      right eye    There were no vitals filed for this visit.  Visit Diagnosis:  Neck pain on left side  Left-sided low back pain without sciatica      Subjective Assessment - 03/07/15 1010    Subjective felt alot better after estim and heat, decreased tension. Verb doing HEP.    Currently in Pain? Yes   Pain Score 5    Pain Location Neck   Pain Orientation Left   Multiple Pain Sites Yes   Pain Score 5   Pain Location Back                         [facility] Adult PT Treatment/Exercise - 03/07/15 0001    Exercises   Exercises Neck;Lumbar;Knee/Hip   Neck Exercises: Theraband   Scapula Retraction 10 reps  YELLOW,3 way   Neck Exercises: Standing   Other Standing Exercises ball vs wall 10 times   Other Standing Exercises 3# shruggs and backward rolls 15 times each   Lumbar Exercises: Aerobic   Stationary Bike Nustep L 5 5 min   Lumbar  Exercises: Seated   Other Seated Lumbar Exercises sit fit pelvic ROM 4 way, LAQ and marching 10 times each   Modalities   Modalities Electrical Stimulation;Moist Heat   Moist Heat Therapy   Number Minutes Moist Heat 15 Minutes   Moist Heat Location Cervical;Shoulder;Hip  Left cervical/UT, left low back/hip   Electrical Stimulation   Electrical Stimulation Location L proximal cervical muscles to L UT, Lparaspinals, L glute medius   Electrical Stimulation Parameters premod   Electrical Stimulation Goals Pain                  PT Short Term Goals - 03/01/15 1629    PT SHORT TERM GOAL #1   Title Pt will ind with HEP   Time 2   Status New           PT Long Term Goals - 03/01/15 1630    PT LONG TERM GOAL #1   Title Pt will have pain decrease 50%   Time 8   Period Weeks   Status New   PT LONG TERM GOAL #2   Title Pt will have increased UE  strength 4/5 to maintain posture and decrease muscle imbalances.   Time 8   Period Weeks   Status New   PT LONG TERM GOAL #3   Title Pt will have increased LE strength 4/5.   PT LONG TERM GOAL #4   Title Pt will increase cervical ROM 25%.     Time 8   Period Weeks   Status New               Plan - 03/07/15 1022    Clinical Impression Statement pt tolerate dther ex fair, agreeable but limited by pain. V/C for correct tech as pt compensates with pain   PT Next Visit Plan Progress as tolerated with ther ex. Add to HEP for strength,pt currently doing gentle cerv ROM.        Problem List Patient Active Problem List   Diagnosis Date Noted  . History of cataract 02/27/2015  . Seasonal and perennial allergic rhinitis 02/15/2012  . Cough 11/04/2011  . Diabetic neuropathy 10/11/2011  . Diastolic CHF, chronic [id number]  . CKD (chronic kidney disease) stage 3, GFR 30-59 ml/min 10/10/2011  . DIABETES MELLITUS, TYPE II 07/03/2010  . HYPERLIPIDEMIA 07/03/2010  . HYPERTENSION 07/03/2010    Lorrane Mccay,ANGIE PTA 03/07/2015,  10:25 AM  [address] [address] [place], [place], [address] Phone: [phone]   Fax:  [phone]

## 2015-03-09 ENCOUNTER — Ambulatory Visit: Payer: Self-pay | Admitting: Physical Therapy

## 2015-03-09 DIAGNOSIS — M545 Low back pain, unspecified: Secondary | ICD-10-CM

## 2015-03-09 DIAGNOSIS — M542 Cervicalgia: Secondary | ICD-10-CM

## 2015-03-09 DIAGNOSIS — M79605 Pain in left leg: Secondary | ICD-10-CM

## 2015-03-09 NOTE — Therapy (Signed)
[place], [place], [address] Phone: [phone]   Fax:  [phone]  Physical Therapy Treatment  Patient Details  Name: Paula [name] MRN: [id number] Date of Birth: [birth year] Referring Provider:  Tresa Garter, MD  Encounter Date: 03/09/2015      PT End of Session - 03/09/15 0928    Visit Number 3   Date for PT Re-Evaluation 05/02/15   PT Start Time 0926   PT Stop Time 1018   PT Time Calculation (min) 52 min   Activity Tolerance Patient tolerated treatment well   Behavior During Therapy [facility] for tasks assessed/performed      Past Medical History  Diagnosis Date  . Allergic rhinitis   . Cough   . GERD (gastroesophageal reflux disease)   . Diabetes mellitus   . Hypertension   . Hyperlipidemia   . History of colon polyps 2012    tubular adenoma   . Arthritis   . PONV (postoperative nausea and vomiting)     trouble waking up  . Diastolic CHF, chronic 123XX123  . Neuropathy 10/11/2011    Past Surgical History  Procedure Laterality Date  . Nasal turbinate reduction    . Lymphatic mass surgery    . Cataract extraction      right eye    There were no vitals filed for this visit.  Visit Diagnosis:  Neck pain on left side  Left-sided low back pain without sciatica  Left leg pain      Subjective Assessment - 03/09/15 0926    Subjective feel much better   Currently in Pain? Yes   Pain Score 5    Pain Location Back  L subscapular area   Pain Orientation Left   Pain Descriptors / Indicators Aching;Tightness   Multiple Pain Sites Yes   Pain Score 5   Pain Location Back   Pain Orientation Lower   Pain Descriptors / Indicators Aching                         [facility] Adult PT Treatment/Exercise - 03/09/15 0001    Neck Exercises: Theraband   Scapula Retraction 10 reps  2 sets   Neck Exercises: Standing   Other Standing Exercises 3#  shrugs/depression and backward rolls 15 times each   Neck Exercises: Seated   Other Seated Exercise Thoracic rotation with manual overpressure holding cane x 10 each way   Lumbar Exercises: Aerobic   Stationary Bike Nustep L 5 5 min   Lumbar Exercises: Seated   Other Seated Lumbar Exercises sit fit pelvic ROM 4 way, LAQ and marching 10 times each   Shoulder Exercises: Standing   Extension Strengthening;Both;15 reps   Theraband Level (Shoulder Extension) Level 3 (Green)   Row Strengthening;Both;15 reps  2 sets   Theraband Level (Shoulder Row) Level 3 (Green)   Modalities   Modalities Electrical Stimulation;Moist Heat   Moist Heat Therapy   Number Minutes Moist Heat 15 Minutes   Moist Heat Location Cervical;Shoulder;Hip   Electrical Stimulation   Electrical Stimulation Location R/L UT, L T11/12 paraspinals, L glute med   Electrical Stimulation Parameters premod   Electrical Stimulation Goals Pain   Manual Therapy   Manual Therapy Joint mobilization;Soft tissue mobilization   Joint Mobilization C4-T12, PA mobes, T1-T12 L unilateral mobes, T11/12 costovertebral mobes, grade 1-2  tightness at C6-7, pain relief at T11/12 with unilat,  PT Education - 03/09/15 1006    Education provided Yes   Education Details provided green/blue TB for shoulder exercises, education on posture   Person(s) Educated Patient   Methods Explanation;Demonstration;Verbal cues   Comprehension Verbalized understanding          PT Short Term Goals - 03/01/15 1629    PT SHORT TERM GOAL #1   Title Pt will ind with HEP   Time 2   Status New           PT Long Term Goals - 03/01/15 1630    PT LONG TERM GOAL #1   Title Pt will have pain decrease 50%   Time 8   Period Weeks   Status New   PT LONG TERM GOAL #2   Title Pt will have increased UE strength 4/5 to maintain posture and decrease muscle imbalances.   Time 8   Period Weeks   Status New   PT LONG TERM GOAL #3   Title  Pt will have increased LE strength 4/5.   PT LONG TERM GOAL #4   Title Pt will increase cervical ROM 25%.     Time 8   Period Weeks   Status New               Plan - 03/09/15 1007    Clinical Impression Statement Pt improving with posture, and increased exercises.  Added in cervical/thoracic mobilizations for pain.     Pt will benefit from skilled therapeutic intervention in order to improve on the following deficits Abnormal gait;Decreased activity tolerance;Decreased endurance;Decreased mobility;Decreased range of motion;Hypomobility;Decreased strength;Difficulty walking;Pain   Rehab Potential Good   PT Frequency 2x / week   PT Duration 8 weeks   PT Next Visit Plan Progress as tolerated with ther ex. Add to HEP for strength,pt currently doing gentle cerv ROM.  possibly add in glute med/piriformis stretching   PT Home Exercise Plan TB for shoulder exercises, posture education   Consulted and Agree with Plan of Care Patient        Problem List Patient Active Problem List   Diagnosis Date Noted  . History of cataract 02/27/2015  . Seasonal and perennial allergic rhinitis 02/15/2012  . Cough 11/04/2011  . Diabetic neuropathy 10/11/2011  . Diastolic CHF, chronic [id number]  . CKD (chronic kidney disease) stage 3, GFR 30-59 ml/min 10/10/2011  . DIABETES MELLITUS, TYPE II 07/03/2010  . HYPERLIPIDEMIA 07/03/2010  . HYPERTENSION 07/03/2010    Sumner Boast., PT 03/09/2015, 11:29 AM  [place] [address] [place], [place], [address] Phone: [phone]   Fax:  [phone]

## 2015-03-13 ENCOUNTER — Ambulatory Visit: Payer: Self-pay | Admitting: Physical Therapy

## 2015-03-16 ENCOUNTER — Ambulatory Visit: Payer: Self-pay | Attending: Internal Medicine | Admitting: Physical Therapy

## 2015-03-16 ENCOUNTER — Encounter: Payer: Self-pay | Admitting: Physical Therapy

## 2015-03-16 DIAGNOSIS — M79605 Pain in left leg: Secondary | ICD-10-CM | POA: Insufficient documentation

## 2015-03-16 DIAGNOSIS — M542 Cervicalgia: Secondary | ICD-10-CM | POA: Insufficient documentation

## 2015-03-16 DIAGNOSIS — M545 Low back pain, unspecified: Secondary | ICD-10-CM

## 2015-03-16 NOTE — Therapy (Signed)
[place], [place], [address] Phone: [phone]   Fax:  [phone]  Physical Therapy Treatment  Patient Details  Name: Paula [name] MRN: [id number] Date of Birth: [birth year] Referring Provider:  Tresa Garter, MD  Encounter Date: 03/16/2015      PT End of Session - 03/16/15 1202    Visit Number 4   Date for PT Re-Evaluation 05/02/15   PT Start Time 1100   PT Stop Time [id number]   PT Time Calculation (min) 56 min   Activity Tolerance Patient tolerated treatment well   Behavior During Therapy [facility] for tasks assessed/performed      Past Medical History  Diagnosis Date  . Allergic rhinitis   . Cough   . GERD (gastroesophageal reflux disease)   . Diabetes mellitus   . Hypertension   . Hyperlipidemia   . History of colon polyps 2012    tubular adenoma   . Arthritis   . PONV (postoperative nausea and vomiting)     trouble waking up  . Diastolic CHF, chronic 123XX123  . Neuropathy 10/11/2011    Past Surgical History  Procedure Laterality Date  . Nasal turbinate reduction    . Lymphatic mass surgery    . Cataract extraction      right eye    There were no vitals filed for this visit.  Visit Diagnosis:  Neck pain on left side  Left-sided low back pain without sciatica  Left leg pain      Subjective Assessment - 03/16/15 1100    Subjective I had pain a couple days after the last treatment and that is why I couldn't come.  Then I had a dr. appt for my cataracts.  I have a pain that is shooting up into my head that I might have to get taken care of because that wasn't  there before the bus event.  The pushing on my back and neck really helped (jt mobes)   Currently in Pain? Yes   Pain Score 5    Pain Location Back  and neck   Pain Orientation Left   Pain Descriptors / Indicators Aching;Shooting   Pain Type Chronic pain                         [facility]  Adult PT Treatment/Exercise - 03/16/15 0001    Neck Exercises: Seated   Other Seated Exercise Thoracic rotation with manual overpressure holding cane x 10 each way   Lumbar Exercises: Aerobic   Stationary Bike Nustep L 5 6 min   Shoulder Exercises: Standing   Row Strengthening;Both;10 reps  2 sets 10   Row Weight (lbs) 20#   Other Standing Exercises Lat pull down 2 x 10  15#   Modalities   Modalities Electrical Stimulation   Moist Heat Therapy   Number Minutes Moist Heat 15 Minutes   Moist Heat Location Cervical;Shoulder;Hip   Electrical Stimulation   Electrical Stimulation Location R/L UT, L T11/12 paraspinals, L glute med   Electrical Stimulation Action Premod   Electrical Stimulation Goals Pain   Manual Therapy   Manual Therapy Joint mobilization;Soft tissue mobilization   Joint Mobilization C4-T12, PA mobes, lateral glides, T1-T12 L unilateral mobes, T11/12 costovertebral mobes, grade 2-3, L3/4  tightness in paraspinals, C6-T10, L3/4   Soft tissue mobilization paraspinals L/R side, L QL  PT Education - 03/16/15 1201    Education provided Yes   Education Details movement, thoracic self mobilizations and stretching   Person(s) Educated Patient   Methods Explanation;Demonstration   Comprehension Verbalized understanding;Returned demonstration          PT Short Term Goals - 03/01/15 1629    PT SHORT TERM GOAL #1   Title Pt will ind with HEP   Time 2   Status New           PT Long Term Goals - 03/01/15 1630    PT LONG TERM GOAL #1   Title Pt will have pain decrease 50%   Time 8   Period Weeks   Status New   PT LONG TERM GOAL #2   Title Pt will have increased UE strength 4/5 to maintain posture and decrease muscle imbalances.   Time 8   Period Weeks   Status New   PT LONG TERM GOAL #3   Title Pt will have increased LE strength 4/5.   PT LONG TERM GOAL #4   Title Pt will increase cervical ROM 25%.     Time 8   Period Weeks   Status  New               Plan - 03/16/15 1202    Clinical Impression Statement Pt had increase in pain a couple days after last treatment.  Denied doing anything that could have caused the pain.  Pt mentioned the joint mobilizations helped a lot.  Focused today on strengthning and joint mobilizations   Pt will benefit from skilled therapeutic intervention in order to improve on the following deficits Abnormal gait;Decreased activity tolerance;Decreased endurance;Decreased mobility;Decreased range of motion;Hypomobility;Decreased strength;Difficulty walking;Pain   Rehab Potential Good   PT Frequency 2x / week   PT Duration 8 weeks   PT Next Visit Plan Progress as tolerated with ther ex. Add to HEP for strength,pt currently doing gentle cerv ROM.  possibly add in glute med/piriformis stretching   PT Home Exercise Plan posture, thoracic self mobes   Consulted and Agree with Plan of Care Patient        Problem List Patient Active Problem List   Diagnosis Date Noted  . History of cataract 02/27/2015  . Seasonal and perennial allergic rhinitis 02/15/2012  . Cough 11/04/2011  . Diabetic neuropathy 10/11/2011  . Diastolic CHF, chronic [id number]  . CKD (chronic kidney disease) stage 3, GFR 30-59 ml/min 10/10/2011  . DIABETES MELLITUS, TYPE II 07/03/2010  . HYPERLIPIDEMIA 07/03/2010  . HYPERTENSION 07/03/2010    Sumner Boast., PT 03/16/2015, 12:10 PM  [address] [place], [place], [address] Phone: [phone]   Fax:  [phone]

## 2015-03-20 ENCOUNTER — Ambulatory Visit: Payer: Self-pay | Admitting: Physical Therapy

## 2015-03-20 DIAGNOSIS — M79605 Pain in left leg: Secondary | ICD-10-CM

## 2015-03-20 DIAGNOSIS — M545 Low back pain, unspecified: Secondary | ICD-10-CM

## 2015-03-20 DIAGNOSIS — M542 Cervicalgia: Secondary | ICD-10-CM

## 2015-03-20 NOTE — Therapy (Signed)
[place], [place], [address] Phone: [phone]   Fax:  [phone]  Physical Therapy Treatment  Patient Details  Name: Paula [name] MRN: [id number] Date of Birth: [birth year] Referring Provider:  Tresa Garter, MD  Encounter Date: 03/20/2015      PT End of Session - 03/20/15 1139    Visit Number 5   Date for PT Re-Evaluation 05/02/15   PT Start Time 1055   PT Stop Time 1150   PT Time Calculation (min) 55 min   Activity Tolerance Patient tolerated treatment well   Behavior During Therapy [facility] for tasks assessed/performed      Past Medical History  Diagnosis Date  . Allergic rhinitis   . Cough   . GERD (gastroesophageal reflux disease)   . Diabetes mellitus   . Hypertension   . Hyperlipidemia   . History of colon polyps 2012    tubular adenoma   . Arthritis   . PONV (postoperative nausea and vomiting)     trouble waking up  . Diastolic CHF, chronic 123XX123  . Neuropathy 10/11/2011    Past Surgical History  Procedure Laterality Date  . Nasal turbinate reduction    . Lymphatic mass surgery    . Cataract extraction      right eye    There were no vitals filed for this visit.  Visit Diagnosis:  Neck pain on left side  Left-sided low back pain without sciatica  Left leg pain      Subjective Assessment - 03/20/15 1059    Subjective I feel terrific, the points you touched on my back and neck feel good and I don't have the pain going up into my head anymore.     Currently in Pain? Yes   Pain Score 2    Pain Location Back   Pain Orientation Left   Pain Descriptors / Indicators Aching   Pain Type Chronic pain;Acute pain   Pain Onset 1 to 4 weeks ago   Pain Frequency Intermittent                         [facility] Adult PT Treatment/Exercise - 03/20/15 0001    Neck Exercises: Seated   Other Seated Exercise Thoracic rotation with manual overpressure  holding cane x 10 each way   Lumbar Exercises: Aerobic   Stationary Bike Nustep L 5 6 min   Shoulder Exercises: Standing   Row Strengthening;Both;10 reps   Row Weight (lbs) 20#   Other Standing Exercises Lat pull down 2 x 10  20#   Modalities   Modalities Electrical Stimulation   Moist Heat Therapy   Number Minutes Moist Heat 15 Minutes   Moist Heat Location Cervical;Shoulder;Hip   Electrical Stimulation   Electrical Stimulation Location R/L UT, L T11/12 paraspinals, L glute med   Electrical Stimulation Action premod   Electrical Stimulation Parameters to tolerance   Electrical Stimulation Goals Pain   Manual Therapy   Manual Therapy Joint mobilization;Soft tissue mobilization   Joint Mobilization C4-T12, PA mobes, lateral glides, T1-T12 L unilateral mobes, T9/10 costovertebral mobes, grade 2-3, L3/4   Soft tissue mobilization paraspinals L/R side, L QL                PT Education - 03/20/15 1139    Education provided Yes   Education Details thoracic mobilizations/stretching, doorway stretch   Person(s) Educated Patient   Methods Explanation;Demonstration;Tactile cues;Verbal cues  Comprehension Verbalized understanding;Returned demonstration          PT Short Term Goals - 03/20/15 1142    PT SHORT TERM GOAL #1   Status Achieved           PT Long Term Goals - 03/20/15 1142    PT LONG TERM GOAL #1   Status Achieved               Plan - 03/20/15 1139    Clinical Impression Statement Pt is doing great, she reports decrease in pain in cervical/thoracic/L LBP.  She has been doing her thoracic rotations and stretching.  Continued joint mobilizations in cervical/thoracic spine PA/lateral glides/unilateral.  STM of L QL.  Discussed visit on Thursday and having next week as possible discharge by the end of the week.     Pt will benefit from skilled therapeutic intervention in order to improve on the following deficits Abnormal gait;Decreased activity  tolerance;Decreased endurance;Decreased mobility;Decreased range of motion;Hypomobility;Decreased strength;Difficulty walking;Pain   Rehab Potential Good   PT Frequency 2x / week   PT Duration 2 weeks   PT Next Visit Plan Continue joint mobilizations and scapular stabilization, STM   PT Home Exercise Plan posture, thoracic self mobes   Consulted and Agree with Plan of Care Patient        Problem List Patient Active Problem List   Diagnosis Date Noted  . History of cataract 02/27/2015  . Seasonal and perennial allergic rhinitis 02/15/2012  . Cough 11/04/2011  . Diabetic neuropathy 10/11/2011  . Diastolic CHF, chronic [id number]  . CKD (chronic kidney disease) stage 3, GFR 30-59 ml/min 10/10/2011  . DIABETES MELLITUS, TYPE II 07/03/2010  . HYPERLIPIDEMIA 07/03/2010  . HYPERTENSION 07/03/2010    Scot Jun, PTA 03/20/2015, 11:50 AM  [place], [place], [address] Phone: [phone]   Fax:  [phone]

## 2015-03-22 ENCOUNTER — Ambulatory Visit: Payer: Self-pay | Admitting: Physical Therapy

## 2015-03-22 DIAGNOSIS — M545 Low back pain, unspecified: Secondary | ICD-10-CM

## 2015-03-22 DIAGNOSIS — M542 Cervicalgia: Secondary | ICD-10-CM

## 2015-03-22 DIAGNOSIS — M79605 Pain in left leg: Secondary | ICD-10-CM

## 2015-03-22 NOTE — Therapy (Signed)
[place], [place], [address] Phone: [phone]   Fax:  [phone]  Physical Therapy Treatment  Patient Details  Name: Paula [name] MRN: [id number] Date of Birth: [birth year] Referring Provider:  Tresa Garter, MD  Encounter Date: 03/22/2015      PT End of Session - 03/22/15 1059    Visit Number 6   PT Start Time 1012   PT Stop Time 1108   PT Time Calculation (min) 56 min   Activity Tolerance Patient tolerated treatment well   Behavior During Therapy [facility] for tasks assessed/performed      Past Medical History  Diagnosis Date  . Allergic rhinitis   . Cough   . GERD (gastroesophageal reflux disease)   . Diabetes mellitus   . Hypertension   . Hyperlipidemia   . History of colon polyps 2012    tubular adenoma   . Arthritis   . PONV (postoperative nausea and vomiting)     trouble waking up  . Diastolic CHF, chronic 123XX123  . Neuropathy 10/11/2011    Past Surgical History  Procedure Laterality Date  . Nasal turbinate reduction    . Lymphatic mass surgery    . Cataract extraction      right eye    There were no vitals filed for this visit.  Visit Diagnosis:  Neck pain on left side  Left-sided low back pain without sciatica  Left leg pain      Subjective Assessment - 03/22/15 1013    Subjective Doing well today, just a little tightness in the shoulders.  Maybe because of packing and moving.     Currently in Pain? No/denies                         [facility] Adult PT Treatment/Exercise - 03/22/15 0001    Neck Exercises: Seated   Other Seated Exercise Thoracic rotation with manual overpressure holding cane x 10 each way   Lumbar Exercises: Aerobic   Stationary Bike bike, 6 min no resistance   Knee/Hip Exercises: Supine   Other Supine Knee/Hip Exercises QL distraction 3 x 25 sec   Shoulder Exercises: Standing   Row Strengthening;Both  8 reps, 2  sets   Row Weight (lbs) 25#   Other Standing Exercises Lat pull down 2 x 8  2#   Modalities   Modalities Electrical Stimulation   Moist Heat Therapy   Number Minutes Moist Heat 15 Minutes   Moist Heat Location Cervical;Shoulder;Hip   Electrical Stimulation   Electrical Stimulation Location R/L UT, L T11/12 paraspinals, L glute med   Electrical Stimulation Action premod   Electrical Stimulation Parameters to tolerance   Electrical Stimulation Goals Pain   Manual Therapy   Manual Therapy Joint mobilization;Soft tissue mobilization   Joint Mobilization C4-T12, PA mobes, lateral glides, T1-T12 L unilateral mobes, T9/10 costovertebral mobes, grade 2-3, L3/4   Soft tissue mobilization paraspinals L/R side, L QL   Passive ROM Cervical Rot/flex/SB/45 degrees                PT Education - 03/22/15 1059    Education provided Yes   Education Details thoracic stretching, cervical stretching   Person(s) Educated Patient   Methods Explanation;Demonstration   Comprehension Verbalized understanding          PT Short Term Goals - 03/20/15 1142    PT SHORT TERM GOAL #1   Status Achieved  PT Long Term Goals - 03/20/15 1142    PT LONG TERM GOAL #1   Status Achieved               Plan - 03/22/15 1100    Clinical Impression Statement pt continues to do well with decreased pain and increased mobility.  spinal mobilizations were less restricted today.  Cervical ROM is much improved from initial evaluation.  Discussed discharge next week.     Pt will benefit from skilled therapeutic intervention in order to improve on the following deficits Abnormal gait;Decreased activity tolerance;Decreased endurance;Decreased mobility;Decreased range of motion;Hypomobility;Decreased strength;Difficulty walking;Pain   PT Frequency 2x / week   PT Duration 2 weeks   PT Next Visit Plan Continue joint mobilizations and scapular stabilization, STM   PT Home Exercise Plan posture,  thoracic self mobes, cervical ROM   Consulted and Agree with Plan of Care Patient        Problem List Patient Active Problem List   Diagnosis Date Noted  . History of cataract 02/27/2015  . Seasonal and perennial allergic rhinitis 02/15/2012  . Cough 11/04/2011  . Diabetic neuropathy 10/11/2011  . Diastolic CHF, chronic [id number]  . CKD (chronic kidney disease) stage 3, GFR 30-59 ml/min 10/10/2011  . DIABETES MELLITUS, TYPE II 07/03/2010  . HYPERLIPIDEMIA 07/03/2010  . HYPERTENSION 07/03/2010    Sumner Boast., PT 03/22/2015, 3:59 PM  [address] [place], [place], [address] Phone: [phone]   Fax:  [phone]

## 2015-03-23 ENCOUNTER — Ambulatory Visit: Payer: Self-pay | Admitting: Physical Therapy

## 2015-03-26 ENCOUNTER — Encounter: Payer: Self-pay | Admitting: Physical Therapy

## 2015-03-26 ENCOUNTER — Ambulatory Visit: Payer: Self-pay | Admitting: Physical Therapy

## 2015-03-26 DIAGNOSIS — M542 Cervicalgia: Secondary | ICD-10-CM

## 2015-03-26 NOTE — Therapy (Signed)
[place], [place], [address] Phone: [phone]   Fax:  [phone]  Physical Therapy Treatment  Patient Details  Name: Paula [name] MRN: [id number] Date of Birth: 17-May-1946 Referring Provider:  Tresa Garter, MD  Encounter Date: 03/26/2015      PT End of Session - 03/26/15 1100    Visit Number 7   Date for PT Re-Evaluation 05/02/15   PT Start Time 1022   PT Stop Time 1100   PT Time Calculation (min) 38 min   Activity Tolerance Patient tolerated treatment well   Behavior During Therapy [facility] for tasks assessed/performed      Past Medical History  Diagnosis Date  . Allergic rhinitis   . Cough   . GERD (gastroesophageal reflux disease)   . Diabetes mellitus   . Hypertension   . Hyperlipidemia   . History of colon polyps 2012    tubular adenoma   . Arthritis   . PONV (postoperative nausea and vomiting)     trouble waking up  . Diastolic CHF, chronic 10/16/962  . Neuropathy 10/11/2011    Past Surgical History  Procedure Laterality Date  . Nasal turbinate reduction    . Lymphatic mass surgery    . Cataract extraction      right eye    There were no vitals filed for this visit.  Visit Diagnosis:  Neck pain on left side      Subjective Assessment - 03/26/15 1026    Subjective Pt reports that only a little pain in the neck that goes down into the L shoulder otherwise she is totally healed    Pertinent History DM, HTN   Currently in Pain? Yes   Pain Score 2    Pain Location Neck   Pain Orientation Left   Pain Descriptors / Indicators Aching                         [facility] Adult PT Treatment/Exercise - 03/26/15 0001    Lumbar Exercises: Aerobic   Stationary Bike bike, 6 min no resistance   Shoulder Exercises: Standing   Extension 15 reps;Theraband  2 sets    Theraband Level (Shoulder Extension) Level 3 (Green)   Row Strengthening;Both;10 reps   3 sets    Row Weight (lbs) 25#   Retraction 10 reps  2 sets    Theraband Level (Shoulder Retraction) Level 3 (Green)   Other Standing Exercises Lat pull down 3 x 10 #25   Manual Therapy   Manual Therapy Soft tissue mobilization   Soft tissue mobilization paraspinals L/R side, L QL   Passive ROM Cervical Rot/flex/SB/45 degrees                  PT Short Term Goals - 03/20/15 1142    PT SHORT TERM GOAL #1   Status Achieved           PT Long Term Goals - 03/26/15 1104    PT LONG TERM GOAL #2   Title Pt will have increased UE strength 4/5 to maintain posture and decrease muscle imbalances.   Status On-going   PT LONG TERM GOAL #3   Title Pt will have increased LE strength 4/5.   Status On-going   PT LONG TERM GOAL #4   Title Pt will increase cervical ROM 25%.     Status Partially Met  Plan - 03/26/15 1101    Clinical Impression Statement Pt 5 minutes late for PT treatment. Pt reports decreased pain and increased mobility. Pt able to complete additional therapeutic interventions without issue.    Pt will benefit from skilled therapeutic intervention in order to improve on the following deficits Abnormal gait;Decreased activity tolerance;Decreased endurance;Decreased mobility;Decreased range of motion;Hypomobility;Decreased strength;Difficulty walking;Pain   PT Frequency 2x / week   PT Duration 2 weeks   PT Next Visit Plan d/c PT         Problem List Patient Active Problem List   Diagnosis Date Noted  . History of cataract 02/27/2015  . Seasonal and perennial allergic rhinitis 02/15/2012  . Cough 11/04/2011  . Diabetic neuropathy 10/11/2011  . Diastolic CHF, chronic 19/54/2481  . CKD (chronic kidney disease) stage 3, GFR 30-59 ml/min 10/10/2011  . DIABETES MELLITUS, TYPE II 07/03/2010  . HYPERLIPIDEMIA 07/03/2010  . HYPERTENSION 07/03/2010    Scot Jun, PTA  03/26/2015, 11:06 AM  [place] [address] [place], [place], [address] Phone: [phone]   Fax:  [phone]

## 2015-03-27 ENCOUNTER — Encounter: Payer: Self-pay | Admitting: Pharmacist

## 2015-03-28 ENCOUNTER — Ambulatory Visit: Payer: Self-pay | Attending: Internal Medicine | Admitting: Internal Medicine

## 2015-03-28 ENCOUNTER — Encounter: Payer: Self-pay | Admitting: Internal Medicine

## 2015-03-28 VITALS — BP 128/80 | HR 77 | Temp 98.2°F | Resp 18 | Ht 63.0 in | Wt 152.0 lb

## 2015-03-28 DIAGNOSIS — R42 Dizziness and giddiness: Secondary | ICD-10-CM

## 2015-03-28 DIAGNOSIS — E119 Type 2 diabetes mellitus without complications: Secondary | ICD-10-CM

## 2015-03-28 LAB — COMPLETE METABOLIC PANEL WITH GFR
ALT: 20 U/L (ref 6–29)
AST: 21 U/L (ref 10–35)
Albumin: 4.1 g/dL (ref 3.6–5.1)
Alkaline Phosphatase: 73 U/L (ref 33–130)
BUN: 21 mg/dL (ref 7–25)
CO2: 28 mmol/L (ref 20–31)
Calcium: 10 mg/dL (ref 8.6–10.4)
Chloride: 101 mmol/L (ref 98–110)
Creat: 1.64 mg/dL — ABNORMAL HIGH (ref 0.50–0.99)
GFR, Est African American: 37 mL/min — ABNORMAL LOW (ref >=60)
GFR, Est Non African American: 32 mL/min — ABNORMAL LOW (ref >=60)
Glucose, Bld: 97 mg/dL (ref 65–99)
Potassium: 4.9 mmol/L (ref 3.5–5.3)
Sodium: 139 mmol/L (ref 135–146)
Total Bilirubin: 0.5 mg/dL (ref 0.2–1.2)
Total Protein: 7.4 g/dL (ref 6.1–8.1)

## 2015-03-28 LAB — CBC
HCT: 37.9 % (ref 36.0–46.0)
Hemoglobin: 12.5 g/dL (ref 12.0–15.0)
MCH: 28.5 pg (ref 26.0–34.0)
MCHC: 33 g/dL (ref 30.0–36.0)
MCV: 86.3 fL (ref 78.0–100.0)
MPV: 12 fL (ref 8.6–12.4)
Platelets: 231 10*3/uL (ref 150–400)
RBC: 4.39 MIL/uL (ref 3.87–5.11)
RDW: 14.3 % (ref 11.5–15.5)
WBC: 5.7 10*3/uL (ref 4.0–10.5)

## 2015-03-28 LAB — POCT GLYCOSYLATED HEMOGLOBIN (HGB A1C): Hemoglobin A1C: 6.2

## 2015-03-28 LAB — GLUCOSE, POCT (MANUAL RESULT ENTRY): POC Glucose: 124 mg/dl — AB (ref 70–99)

## 2015-03-28 NOTE — Progress Notes (Signed)
Patient ID: Paula [name], female   DOB: 03-22-1946, 69 y.o.   MRN: [id number]  CC: dizziness  HPI: Paula [name] is a 69 y.o. female here today for a follow up visit.  Patient has past medical history of GERD, HTN, T2DM, CHF, CKD, and HLD. Patient reports that she experienced dizziness for 2 days three weeks ago that has now resolved. She states that she felt off balance but does not remember if it occurred with position changes. She reports at that time her BP and cbg were both normal. She reports concern because of CKD.  Patient also reports that she has always been managed by a endocrinologist for her diabetes and wants to know if she should go back.    Allergies  Allergen Reactions  . Dust Mite Extract Cough  . Ace Inhibitors Cough    chronic cough  . Lovastatin Other (See Comments)    Generalized body pain   Past Medical History  Diagnosis Date  . Allergic rhinitis   . Cough   . GERD (gastroesophageal reflux disease)   . Diabetes mellitus   . Hypertension   . Hyperlipidemia   . History of colon polyps 2012    tubular adenoma   . Arthritis   . PONV (postoperative nausea and vomiting)     trouble waking up  . Diastolic CHF, chronic 6/0/0459  . Neuropathy 10/11/2011   Current Outpatient Prescriptions on File Prior to Visit  Medication Sig Dispense Refill  . acetaminophen (TYLENOL) 500 MG tablet Take 500 mg by mouth every 6 (six) hours as needed for mild pain or headache.    . albuterol (PROVENTIL HFA;VENTOLIN HFA) 108 (90 BASE) MCG/ACT inhaler Inhale 2 puffs into the lungs every 6 (six) hours as needed. Shortness of breath 18 g 5  . allopurinol (ZYLOPRIM) 100 MG tablet Take 1 tablet (100 mg total) by mouth daily. 30 tablet 2  . amLODipine (NORVASC) 10 MG tablet Take 1 tablet (10 mg total) by mouth daily with breakfast. 90 tablet 2  . aspirin 81 MG tablet Take 81 mg by mouth daily.      . calcium-vitamin D (OSCAL WITH D) 500-200 MG-UNIT per tablet Take 1 tablet by  mouth every morning.    . diclofenac sodium (VOLTAREN) 1 % GEL Apply 1 application topically 4 (four) times daily. 100 g 0  . glipiZIDE (GLIPIZIDE XL) 2.5 MG 24 hr tablet Take 1 tablet (2.5 mg total) by mouth daily with breakfast. For diabetes 90 tablet 2  . glucose blood test strip Use as instructed 100 each 12  . glucose monitoring kit (FREESTYLE) monitoring kit 1 each by Does not apply route as needed. 1 each 0  . Lancets (FREESTYLE) lancets Use as instructed 100 each 12  . mometasone (NASONEX) 50 MCG/ACT nasal spray Place 2 sprays into the nose daily. 15 g 2  . pantoprazole (PROTONIX) 20 MG tablet Take 1 tablet (20 mg total) by mouth every morning. 30 tablet 2  . Polyethyl Glycol-Propyl Glycol 0.4-0.3 % SOLN Apply to eye.    . polyvinyl alcohol (LIQUIFILM TEARS) 1.4 % ophthalmic solution Place 2 drops into both eyes 2 (two) times daily.    . rosuvastatin (CRESTOR) 5 MG tablet Take 1 tablet (5 mg total) by mouth daily. 90 tablet 3  . traMADol (ULTRAM) 50 MG tablet Take 1 tablet (50 mg total) by mouth every 12 (twelve) hours as needed for moderate pain. 60 tablet 1  . verapamil (CALAN) 80 MG tablet Take 1  tablet (80 mg total) by mouth 2 (two) times daily. 180 tablet 4  . benzonatate (TESSALON) 100 MG capsule Take 1 capsule (100 mg total) by mouth every 8 (eight) hours. (Patient not taking: Reported on 02/15/2015) 21 capsule 0   No current facility-administered medications on file prior to visit.   Family History  Problem Relation Age of Onset  . Asthma Sister   . Colon cancer Neg Hx   . Hypertension Mother    Social History   Social History  . Marital Status: Widowed    Spouse Name: N/A  . Number of Children: 4  . Years of Education: N/A   Occupational History  . Unemployed    Social History Main Topics  . Smoking status: Never Smoker   . Smokeless tobacco: Never Used  . Alcohol Use: No  . Drug Use: No  . Sexual Activity: Not Currently    Birth Control/ Protection: None    Other Topics Concern  . Not on file   Social History Narrative    Review of Systems: Other than what is stated in HPI, all other systems are negative.   Objective:   Filed Vitals:   03/28/15 1118  BP: 116/68  Pulse: 62  Temp: 98.2 F (36.8 C)  Resp: 18    Physical Exam  Constitutional: She is oriented to person, place, and time.  Cardiovascular: Normal rate, regular rhythm and normal heart sounds.   Pulmonary/Chest: Effort normal and breath sounds normal.  Neurological: She is alert and oriented to person, place, and time. No cranial nerve deficit.  '  Lab Results  Component Value Date   WBC 7.0 09/04/2014   HGB 13.5 09/04/2014   HCT 38.4 09/04/2014   MCV 82.2 09/04/2014   PLT 201 09/04/2014   Lab Results  Component Value Date   CREATININE 1.48* 12/14/2014   CREATININE 1.48* 12/14/2014   BUN 19 12/14/2014   BUN 19 12/14/2014   NA 143 12/14/2014   NA 143 12/14/2014   K 4.2 12/14/2014   K 4.2 12/14/2014   CL 101 12/14/2014   CL 101 12/14/2014   CO2 26 12/14/2014   CO2 26 12/14/2014    Lab Results  Component Value Date   HGBA1C 6.20 03/28/2015   Lipid Panel     Component Value Date/Time   CHOL 249* 12/14/2014 0921   TRIG 164* 12/14/2014 0921   HDL 49 12/14/2014 0921   CHOLHDL 5.1 12/14/2014 0921   VLDL 33 12/14/2014 0921   LDLCALC 167* 12/14/2014 0921       Assessment and plan:   Paula [name] was seen today for dizziness.  Diagnoses and all orders for this visit:  Dizziness -     Orthostatic vital signs -     CBC -     COMPLETE METABOLIC PANEL WITH GFR -     Vitamin D, 25-hydroxy Dizziness resolved now.   Type 2 diabetes mellitus without complication -     Glucose (CBG) -     HgB A1c Patients diabetes is well control as evidence by consistently low a1c.  Patient will continue with current therapy and continue to make necessary lifestyle changes.  Reviewed foot care, diet, exercise, annual health maintenance with patient.    Return in  about 3 months (around 06/28/2015) for DM/HTN.       Paula [name], [name] [phone] 03/28/2015, 11:58 AM

## 2015-03-28 NOTE — Progress Notes (Signed)
Pt been experiencing some dizziness x3wks ago, but has subsided.   Pt wants a referral diabetes specialist

## 2015-03-28 NOTE — Patient Instructions (Signed)
I am checking blood work today to see if I can find a cause for your dizziness. I will call you with results in a few days.   Dizziness Dizziness is a common problem. It is a feeling of unsteadiness or light-headedness. You may feel like you are about to faint. Dizziness can lead to injury if you stumble or fall. A person of any age group can suffer from dizziness, but dizziness is more common in older adults. CAUSES  Dizziness can be caused by many different things, including:  Middle ear problems.  Standing for too long.  Infections.  An allergic reaction.  Aging.  An emotional response to something, such as the sight of blood.  Side effects of medicines.  Tiredness.  Problems with circulation or blood pressure.  Excessive use of alcohol or medicines, or illegal drug use.  Breathing too fast (hyperventilation).  An irregular heart rhythm (arrhythmia).  A low red blood cell count (anemia).  Pregnancy.  Vomiting, diarrhea, fever, or other illnesses that cause body fluid loss (dehydration).  Diseases or conditions such as Parkinson's disease, high blood pressure (hypertension), diabetes, and thyroid problems.  Exposure to extreme heat. DIAGNOSIS  Your health care provider will ask about your symptoms, perform a physical exam, and perform an electrocardiogram (ECG) to record the electrical activity of your heart. Your health care provider may also perform other heart or blood tests to determine the cause of your dizziness. These may include:  Transthoracic echocardiogram (TTE). During echocardiography, sound waves are used to evaluate how blood flows through your heart.  Transesophageal echocardiogram (TEE).  Cardiac monitoring. This allows your health care provider to monitor your heart rate and rhythm in real time.  Holter monitor. This is a portable device that records your heartbeat and can help diagnose heart arrhythmias. It allows your health care provider to  track your heart activity for several days if needed.  Stress tests by exercise or by giving medicine that makes the heart beat faster. TREATMENT  Treatment of dizziness depends on the cause of your symptoms and can vary greatly. HOME CARE INSTRUCTIONS   Drink enough fluids to keep your urine clear or pale yellow. This is especially important in very hot weather. In older adults, it is also important in cold weather.  Take your medicine exactly as directed if your dizziness is caused by medicines. When taking blood pressure medicines, it is especially important to get up slowly.  Rise slowly from chairs and steady yourself until you feel okay.  In the morning, first sit up on the side of the bed. When you feel okay, stand slowly while holding onto something until you know your balance is fine.  Move your legs often if you need to stand in one place for a long time. Tighten and relax your muscles in your legs while standing.  Have someone stay with you for 1-2 days if dizziness continues to be a problem. Do this until you feel you are well enough to stay alone. Have the person call your health care provider if he or she notices changes in you that are concerning.  Do not drive or use heavy machinery if you feel dizzy.  Do not drink alcohol. SEEK IMMEDIATE MEDICAL CARE IF:   Your dizziness or light-headedness gets worse.  You feel nauseous or vomit.  You have problems talking, walking, or using your arms, hands, or legs.  You feel weak.  You are not thinking clearly or you have trouble forming sentences. It  may take a friend or family member to notice this.  You have chest pain, abdominal pain, shortness of breath, or sweating.  Your vision changes.  You notice any bleeding.  You have side effects from medicine that seems to be getting worse rather than better. MAKE SURE YOU:   Understand these instructions.  Will watch your condition.  Will get help right away if you are  not doing well or get worse. Document Released: 01/21/2001 Document Revised: 08/02/2013 Document Reviewed: 02/14/2011 [facility] Patient Information 2015 [place], [place]. This information is not intended to replace advice given to you by your health care provider. Make sure you discuss any questions you have with your health care provider.

## 2015-03-29 LAB — VITAMIN D 25 HYDROXY (VIT D DEFICIENCY, FRACTURES): Vit D, 25-Hydroxy: 29 ng/mL — ABNORMAL LOW (ref 30–100)

## 2015-03-30 ENCOUNTER — Encounter: Payer: Self-pay | Admitting: Physical Therapy

## 2015-03-30 ENCOUNTER — Ambulatory Visit: Payer: Self-pay | Admitting: Physical Therapy

## 2015-03-30 DIAGNOSIS — M545 Low back pain, unspecified: Secondary | ICD-10-CM

## 2015-03-30 DIAGNOSIS — M542 Cervicalgia: Secondary | ICD-10-CM

## 2015-03-30 NOTE — Therapy (Signed)
[facility] [address] [place], [place], [address] Phone: [phone]   Fax:  [phone]  Physical Therapy Treatment  Patient Details  Name: Paula [name] MRN: [id number] Date of Birth: 12-Aug-1945 Referring Provider:  Tresa Garter, MD  Encounter Date: 03/30/2015    Past Medical History  Diagnosis Date  . Allergic rhinitis   . Cough   . GERD (gastroesophageal reflux disease)   . Diabetes mellitus   . Hypertension   . Hyperlipidemia   . History of colon polyps 2012    tubular adenoma   . Arthritis   . PONV (postoperative nausea and vomiting)     trouble waking up  . Diastolic CHF, chronic 12/18/4583  . Neuropathy 10/11/2011    Past Surgical History  Procedure Laterality Date  . Nasal turbinate reduction    . Lymphatic mass surgery    . Cataract extraction      right eye    There were no vitals filed for this visit.  Visit Diagnosis:  Left-sided low back pain without sciatica  Neck pain on left side      Subjective Assessment - 03/30/15 1019    Subjective Pt reports that everything is going good, no pain just tense    Currently in Pain? No/denies   Pain Score 0-No pain                                   PT Short Term Goals - 03/20/15 1142    PT SHORT TERM GOAL #1   Status Achieved           PT Long Term Goals - 03/30/15 1044    PT LONG TERM GOAL #2   Title Pt will have increased UE strength 4/5 to maintain posture and decrease muscle imbalances.   Status Achieved   PT LONG TERM GOAL #3   Title Pt will have increased LE strength 4/5.   Status Achieved               Plan - 03/30/15 1050    Clinical Impression Statement Pt does report that she did not sleep well last night and was a little tired. Pt tolerated exercises well but did get tired on stationary bile. Pt has completed all goals    Rehab Potential Good   PT Frequency 2x / week   PT  Duration 2 weeks   PT Next Visit Plan d/c PT      PHYSICAL THERAPY DISCHARGE SUMMARY   Plan: Patient agrees to discharge.  Patient goals were met. Patient is being discharged due to meeting the stated rehab goals.  ?????       Problem List Patient Active Problem List   Diagnosis Date Noted  . History of cataract 02/27/2015  . Seasonal and perennial allergic rhinitis 02/15/2012  . Cough 11/04/2011  . Diabetic neuropathy 10/11/2011  . Diastolic CHF, chronic 92/92/4462  . CKD (chronic kidney disease) stage 3, GFR 30-59 ml/min 10/10/2011  . DIABETES MELLITUS, TYPE II 07/03/2010  . HYPERLIPIDEMIA 07/03/2010  . HYPERTENSION 07/03/2010  Lum Babe, PT  Sumner Boast, PTA 04/02/2015, 12:28 PM  [facility] [address] [place], [place], [address] Phone: [phone]   Fax:  [phone]

## 2015-04-03 ENCOUNTER — Other Ambulatory Visit: Payer: Self-pay | Admitting: Internal Medicine

## 2015-04-04 ENCOUNTER — Telehealth: Payer: Self-pay | Admitting: Internal Medicine

## 2015-04-04 ENCOUNTER — Other Ambulatory Visit: Payer: Self-pay

## 2015-04-04 DIAGNOSIS — M545 Low back pain, unspecified: Secondary | ICD-10-CM

## 2015-04-04 MED ORDER — TRAMADOL HCL 50 MG PO TABS: 50.0000 mg | ORAL_TABLET | Freq: Two times a day (BID) | ORAL | Status: DC | PRN

## 2015-04-04 NOTE — Progress Notes (Unsigned)
Patient came into office requesting a refill on her tramadol Per Chari Manning NP we can refill it today

## 2015-04-04 NOTE — Telephone Encounter (Signed)
Patient came in requesting a medication refill for   traMADol Veatrice Bourbon)      Please follow up.

## 2015-04-09 ENCOUNTER — Telehealth: Payer: Self-pay

## 2015-04-09 NOTE — Telephone Encounter (Signed)
Returned patient phone call Patient stated that her prescription  Was in fact sent To [facility]

## 2015-04-12 ENCOUNTER — Telehealth: Payer: Self-pay

## 2015-04-12 NOTE — Telephone Encounter (Signed)
-----   Message from Lance Bosch, NP sent at 04/01/2015  8:24 PM EDT ----- Vitamin d is slightly low. She may take OTC vitamin D 800 IU daily. This is important for bone health

## 2015-04-12 NOTE — Telephone Encounter (Signed)
Nurse called patient, patient verified date of birth. Patient aware of vitamin D is slightly low.  Patient agrees to take OTC Vitamin D 800 IU daily. Patient aware of vitamin D being important for bone health.  Patient voices understanding.

## 2015-05-02 ENCOUNTER — Other Ambulatory Visit: Payer: Self-pay | Admitting: Internal Medicine

## 2015-05-04 ENCOUNTER — Telehealth: Payer: Self-pay

## 2015-05-04 ENCOUNTER — Encounter: Payer: Self-pay | Admitting: Internal Medicine

## 2015-05-04 ENCOUNTER — Ambulatory Visit: Payer: Self-pay | Attending: Internal Medicine | Admitting: Internal Medicine

## 2015-05-04 VITALS — BP 153/75 | HR 79 | Temp 98.8°F | Resp 15 | Ht 63.0 in | Wt 153.0 lb

## 2015-05-04 DIAGNOSIS — N189 Chronic kidney disease, unspecified: Secondary | ICD-10-CM | POA: Insufficient documentation

## 2015-05-04 DIAGNOSIS — K219 Gastro-esophageal reflux disease without esophagitis: Secondary | ICD-10-CM | POA: Insufficient documentation

## 2015-05-04 DIAGNOSIS — E785 Hyperlipidemia, unspecified: Secondary | ICD-10-CM | POA: Insufficient documentation

## 2015-05-04 DIAGNOSIS — E119 Type 2 diabetes mellitus without complications: Secondary | ICD-10-CM | POA: Insufficient documentation

## 2015-05-04 DIAGNOSIS — Z79899 Other long term (current) drug therapy: Secondary | ICD-10-CM | POA: Insufficient documentation

## 2015-05-04 DIAGNOSIS — I129 Hypertensive chronic kidney disease with stage 1 through stage 4 chronic kidney disease, or unspecified chronic kidney disease: Secondary | ICD-10-CM | POA: Insufficient documentation

## 2015-05-04 DIAGNOSIS — Z889 Allergy status to unspecified drugs, medicaments and biological substances status: Secondary | ICD-10-CM

## 2015-05-04 DIAGNOSIS — Z7982 Long term (current) use of aspirin: Secondary | ICD-10-CM | POA: Insufficient documentation

## 2015-05-04 DIAGNOSIS — J301 Allergic rhinitis due to pollen: Secondary | ICD-10-CM | POA: Insufficient documentation

## 2015-05-04 LAB — GLUCOSE, POCT (MANUAL RESULT ENTRY): POC Glucose: 110 mg/dl — AB (ref 70–99)

## 2015-05-04 MED ORDER — PANTOPRAZOLE SODIUM 20 MG PO TBEC: 20.0000 mg | DELAYED_RELEASE_TABLET | Freq: Every morning | ORAL | Status: DC

## 2015-05-04 MED ORDER — ALBUTEROL SULFATE HFA 108 (90 BASE) MCG/ACT IN AERS: 2.0000 | INHALATION_SPRAY | Freq: Four times a day (QID) | RESPIRATORY_TRACT | Status: DC | PRN

## 2015-05-04 MED ORDER — GLIPIZIDE ER 2.5 MG PO TB24: 2.5000 mg | ORAL_TABLET | Freq: Every day | ORAL | Status: DC

## 2015-05-04 NOTE — Progress Notes (Signed)
Subjective:    Patient ID: Paula [name], female    DOB: Aug 01, 1946, 69 y.o.   MRN: [id number]  HPI  Patient is 69 year old here for medication refills. Patient has history of GERD, HTN, DMT2, CHF, CKD and hyperlipidemia.  Patient CBG today 110, her A1C was 6.20 on 03/28/15. Patient has elevated BP today of 153/75 states she has taken her medication and is compliant with her medication regimen. Patient reports that she does not feel her 2.5 mg of Glipizide is working because she has seen some cbg readings of 140 earlier this week. Her cbg log has readings of 65-142. She has no diabetic symptoms.     Blood pressure 153/75, pulse 79, temperature 98.8 F (37.1 C), resp. rate 15, height 5' 3" (1.6 m), weight 153 lb (69.4 kg), SpO2 100 %.  Review of Systems  Constitutional: Negative.   HENT: Negative.   Eyes: Negative.   Respiratory: Negative.   Cardiovascular: Negative.        Patient complains of chest pain last week that she rates as minor and she thinks was related to cold that has resolved.  Gastrointestinal: Negative.   Endocrine: Negative.   Genitourinary: Negative.   Musculoskeletal: Negative.   Skin: Negative.   Allergic/Immunologic: Negative.   Neurological: Negative.   Hematological: Negative.   Psychiatric/Behavioral: Negative.     Social History   Social History  . Marital Status: Widowed    Spouse Name: N/A  . Number of Children: 4  . Years of Education: N/A   Occupational History  . Unemployed    Social History Main Topics  . Smoking status: Never Smoker   . Smokeless tobacco: Never Used  . Alcohol Use: No  . Drug Use: No  . Sexual Activity: Not Currently    Birth Control/ Protection: None   Other Topics Concern  . Not on file   Social History Narrative    Outpatient Encounter Prescriptions as of 05/04/2015  Medication Sig Note  . albuterol (PROVENTIL HFA;VENTOLIN HFA) 108 (90 BASE) MCG/ACT inhaler Inhale 2 puffs into the lungs every 6 (six)  hours as needed. Shortness of breath   . allopurinol (ZYLOPRIM) 100 MG tablet Take 1 tablet (100 mg total) by mouth daily.   Marland Kitchen amLODipine (NORVASC) 10 MG tablet Take 1 tablet (10 mg total) by mouth daily with breakfast.   . aspirin 81 MG tablet Take 81 mg by mouth daily.   09/04/2014: .  . calcium-vitamin D (OSCAL WITH D) 500-200 MG-UNIT per tablet Take 1 tablet by mouth every morning. 09/04/2014: .  . diclofenac sodium (VOLTAREN) 1 % GEL Apply 1 application topically 4 (four) times daily.   Marland Kitchen glipiZIDE (GLIPIZIDE XL) 2.5 MG 24 hr tablet Take 1 tablet (2.5 mg total) by mouth daily with breakfast. For diabetes   . rosuvastatin (CRESTOR) 5 MG tablet Take 1 tablet (5 mg total) by mouth daily.   . traMADol (ULTRAM) 50 MG tablet Take 1 tablet (50 mg total) by mouth every 12 (twelve) hours as needed for moderate pain.   . verapamil (CALAN) 80 MG tablet Take 1 tablet (80 mg total) by mouth 2 (two) times daily.   . [DISCONTINUED] albuterol (PROVENTIL HFA;VENTOLIN HFA) 108 (90 BASE) MCG/ACT inhaler Inhale 2 puffs into the lungs every 6 (six) hours as needed. Shortness of breath   . [DISCONTINUED] glipiZIDE (GLIPIZIDE XL) 2.5 MG 24 hr tablet Take 1 tablet (2.5 mg total) by mouth daily with breakfast. For diabetes   .  acetaminophen (TYLENOL) 500 MG tablet Take 500 mg by mouth every 6 (six) hours as needed for mild pain or headache.   . benzonatate (TESSALON) 100 MG capsule Take 1 capsule (100 mg total) by mouth every 8 (eight) hours. (Patient not taking: Reported on 02/15/2015)   . glucose blood test strip Use as instructed   . glucose monitoring kit (FREESTYLE) monitoring kit 1 each by Does not apply route as needed.   . Lancets (FREESTYLE) lancets Use as instructed   . mometasone (NASONEX) 50 MCG/ACT nasal spray Place 2 sprays into the nose daily.   . pantoprazole (PROTONIX) 20 MG tablet Take 1 tablet (20 mg total) by mouth every morning.   Vladimir Faster Glycol-Propyl Glycol 0.4-0.3 % SOLN Apply to eye.   .  polyvinyl alcohol (LIQUIFILM TEARS) 1.4 % ophthalmic solution Place 2 drops into both eyes 2 (two) times daily.   . [DISCONTINUED] pantoprazole (PROTONIX) 20 MG tablet Take 1 tablet (20 mg total) by mouth every morning.    No facility-administered encounter medications on file as of 05/04/2015.      Objective:   Physical Exam  Constitutional: She appears well-developed and well-nourished.  HENT:  Head: Normocephalic and atraumatic.  Right Ear: External ear normal.  Left Ear: External ear normal.  Nose: Nose normal.  Mouth/Throat: Oropharynx is clear and moist.  Eyes: Pupils are equal, round, and reactive to light.  Neck: Normal range of motion.  Cardiovascular: Normal rate, regular rhythm, normal heart sounds and intact distal pulses.   Pulmonary/Chest: Effort normal and breath sounds normal.  Abdominal: Soft.  Musculoskeletal: Normal range of motion.  Neurological: She is alert. She has normal reflexes.  Skin: Skin is warm and dry.  Psychiatric: She has a normal mood and affect. Judgment and thought content normal.      Assessment & Plan   Lumi was seen today for follow-up.  Diagnoses and all orders for this visit:  Type 2 diabetes mellitus without complication -     Glucose (CBG) -    Refill glipiZIDE (GLIPIZIDE XL) 2.5 MG 24 hr tablet; Take 1 tablet (2.5 mg total) by mouth daily with breakfast. For diabetes Patients diabetes is well control as evidence by consistently low a1c.  Patient will continue with current therapy and continue to make necessary lifestyle changes.  Reviewed foot care, diet, exercise, annual health maintenance with patient.   Gastroesophageal reflux disease without esophagitis -     pantoprazole (PROTONIX) 20 MG tablet; Take 1 tablet (20 mg total) by mouth every morning. Stable, refilled meds  H/O seasonal allergies -     albuterol (PROVENTIL HFA;VENTOLIN HFA) 108 (90 BASE) MCG/ACT inhaler; Inhale 2 puffs into the lungs every 6 (six) hours as  needed. Shortness of breath  Patient states she has had influenza vaccination from [place] this year. Patient encouraged to return to the clinic if needed for primary care. Medications refilled and medication teaching completed and patient verbalizes understanding.   Return in about 3 months (around 08/03/2015) for DM/HTN.   Clois Dupes, RN, BSN, [place] 05/04/2015 3:16 PM

## 2015-05-04 NOTE — Progress Notes (Signed)
Patient states her glucose medication is not working She has been getting 5mg  glipizide from [facility] and having  To break it in half Patient states her am blood sugars having been running a little high(140's)

## 2015-05-10 NOTE — Telephone Encounter (Signed)
Error

## 2015-05-31 ENCOUNTER — Other Ambulatory Visit: Payer: Self-pay | Admitting: Internal Medicine

## 2015-06-05 ENCOUNTER — Telehealth: Payer: Self-pay

## 2015-06-05 ENCOUNTER — Other Ambulatory Visit: Payer: Self-pay | Admitting: Family Medicine

## 2015-06-05 ENCOUNTER — Ambulatory Visit: Payer: Self-pay | Attending: Internal Medicine

## 2015-06-05 DIAGNOSIS — M545 Low back pain, unspecified: Secondary | ICD-10-CM

## 2015-06-05 LAB — CBC WITH DIFFERENTIAL/PLATELET
Basophils Absolute: 0 10*3/uL (ref 0.0–0.1)
Basophils Relative: 0 % (ref 0–1)
Eosinophils Absolute: 0.2 10*3/uL (ref 0.0–0.7)
Eosinophils Relative: 3 % (ref 0–5)
HCT: 36.4 % (ref 36.0–46.0)
Hemoglobin: 12.5 g/dL (ref 12.0–15.0)
Lymphocytes Relative: 29 % (ref 12–46)
Lymphs Abs: 1.9 10*3/uL (ref 0.7–4.0)
MCH: 28.7 pg (ref 26.0–34.0)
MCHC: 34.3 g/dL (ref 30.0–36.0)
MCV: 83.5 fL (ref 78.0–100.0)
MPV: 11 fL (ref 8.6–12.4)
Monocytes Absolute: 0.5 10*3/uL (ref 0.1–1.0)
Monocytes Relative: 8 % (ref 3–12)
Neutro Abs: 3.8 10*3/uL (ref 1.7–7.7)
Neutrophils Relative %: 60 % (ref 43–77)
Platelets: 211 10*3/uL (ref 150–400)
RBC: 4.36 MIL/uL (ref 3.87–5.11)
RDW: 13.9 % (ref 11.5–15.5)
WBC: 6.4 10*3/uL (ref 4.0–10.5)

## 2015-06-05 MED ORDER — TRAMADOL HCL 50 MG PO TABS: 50.0000 mg | ORAL_TABLET | Freq: Two times a day (BID) | ORAL | Status: DC | PRN

## 2015-06-05 NOTE — Telephone Encounter (Signed)
Pt. Called requesting a med refill on her Tramadol. Pt. Stated she has been out of her med for two days. Please f/u with pt.

## 2015-06-05 NOTE — Telephone Encounter (Signed)
ok to refill. Same as before with 1 refill

## 2015-06-05 NOTE — Telephone Encounter (Signed)
Patient requesting a refill on her tramadol Thanks

## 2015-06-05 NOTE — Telephone Encounter (Signed)
Called pt to let know Tramadol prescription was ready for pickup

## 2015-06-06 LAB — COMPREHENSIVE METABOLIC PANEL
ALT: 23 U/L (ref 6–29)
AST: 21 U/L (ref 10–35)
Albumin: 4.4 g/dL (ref 3.6–5.1)
Alkaline Phosphatase: 88 U/L (ref 33–130)
BUN: 23 mg/dL (ref 7–25)
CO2: 25 mmol/L (ref 20–31)
Calcium: 9.2 mg/dL (ref 8.6–10.4)
Chloride: 100 mmol/L (ref 98–110)
Creat: 1.32 mg/dL — ABNORMAL HIGH (ref 0.50–0.99)
Glucose, Bld: 95 mg/dL (ref 65–99)
Potassium: 3.7 mmol/L (ref 3.5–5.3)
Sodium: 138 mmol/L (ref 135–146)
Total Bilirubin: 0.3 mg/dL (ref 0.2–1.2)
Total Protein: 7.5 g/dL (ref 6.1–8.1)

## 2015-06-06 LAB — PTH, INTACT AND CALCIUM
Calcium: 9.2 mg/dL (ref 8.4–10.5)
PTH: 103 pg/mL — ABNORMAL HIGH (ref 14–64)

## 2015-06-06 LAB — PHOSPHORUS: Phosphorus: 4 mg/dL (ref 2.1–4.3)

## 2015-06-07 LAB — SPEP & IFE WITH QIG
Albumin ELP: 4.3 g/dL (ref 3.8–4.8)
Alpha-1-Globulin: 0.3 g/dL (ref 0.2–0.3)
Alpha-2-Globulin: 0.7 g/dL (ref 0.5–0.9)
Beta 2: 0.4 g/dL (ref 0.2–0.5)
Beta Globulin: 0.5 g/dL (ref 0.4–0.6)
Gamma Globulin: 1.5 g/dL (ref 0.8–1.7)
IgA: 248 mg/dL (ref 69–380)
IgG (Immunoglobin G), Serum: 1720 mg/dL — ABNORMAL HIGH (ref 690–1700)
IgM, Serum: 37 mg/dL — ABNORMAL LOW (ref 52–322)
Total Protein, Serum Electrophoresis: 7.5 g/dL (ref 6.1–8.1)

## 2015-06-15 ENCOUNTER — Other Ambulatory Visit: Payer: Self-pay | Admitting: Nephrology

## 2015-06-15 DIAGNOSIS — N2581 Secondary hyperparathyroidism of renal origin: Secondary | ICD-10-CM

## 2015-06-15 DIAGNOSIS — D631 Anemia in chronic kidney disease: Secondary | ICD-10-CM

## 2015-06-15 DIAGNOSIS — N189 Chronic kidney disease, unspecified: Secondary | ICD-10-CM

## 2015-06-15 DIAGNOSIS — N183 Chronic kidney disease, stage 3 unspecified: Secondary | ICD-10-CM

## 2015-06-18 ENCOUNTER — Ambulatory Visit
Admission: RE | Admit: 2015-06-18 | Discharge: 2015-06-18 | Disposition: A | Discharge status: Home / Self Care | Payer: No Typology Code available for payment source | Source: Ambulatory Visit | Attending: Nephrology | Admitting: Nephrology

## 2015-06-18 DIAGNOSIS — N2581 Secondary hyperparathyroidism of renal origin: Secondary | ICD-10-CM

## 2015-06-18 DIAGNOSIS — N183 Chronic kidney disease, stage 3 unspecified: Secondary | ICD-10-CM

## 2015-06-18 DIAGNOSIS — D631 Anemia in chronic kidney disease: Secondary | ICD-10-CM

## 2015-06-18 DIAGNOSIS — N189 Chronic kidney disease, unspecified: Secondary | ICD-10-CM

## 2015-06-22 ENCOUNTER — Telehealth: Payer: Self-pay | Admitting: Internal Medicine

## 2015-06-22 ENCOUNTER — Ambulatory Visit: Payer: Self-pay | Attending: Internal Medicine

## 2015-06-22 NOTE — Telephone Encounter (Signed)
Patient would like a refill on medications for 3 Months  Amlodipine Glipizide Verpamil Rosouvastatin Pantoprozole diclofenac sodium Nasonex Albuterol Lancets And a Glucometer

## 2015-07-02 NOTE — Telephone Encounter (Signed)
Patient is calling to check on the status of her refill request. Please follow up with pt. Thank you

## 2015-07-03 NOTE — Telephone Encounter (Signed)
Patient is calling to check on the status of her refill request. Please follow up with pt. Thank you

## 2015-07-04 ENCOUNTER — Telehealth: Payer: Self-pay

## 2015-07-04 ENCOUNTER — Other Ambulatory Visit: Payer: Self-pay

## 2015-07-04 DIAGNOSIS — E785 Hyperlipidemia, unspecified: Secondary | ICD-10-CM

## 2015-07-04 DIAGNOSIS — Z794 Long term (current) use of insulin: Secondary | ICD-10-CM

## 2015-07-04 DIAGNOSIS — I10 Essential (primary) hypertension: Secondary | ICD-10-CM

## 2015-07-04 DIAGNOSIS — E119 Type 2 diabetes mellitus without complications: Secondary | ICD-10-CM

## 2015-07-04 DIAGNOSIS — M545 Low back pain, unspecified: Secondary | ICD-10-CM

## 2015-07-04 MED ORDER — TRAMADOL HCL 50 MG PO TABS: 50.0000 mg | ORAL_TABLET | Freq: Two times a day (BID) | ORAL | Status: DC | PRN

## 2015-07-04 MED ORDER — GLIPIZIDE ER 2.5 MG PO TB24: 2.5000 mg | ORAL_TABLET | Freq: Every day | ORAL | Status: DC

## 2015-07-04 MED ORDER — AMLODIPINE BESYLATE 10 MG PO TABS: 10.0000 mg | ORAL_TABLET | Freq: Every day | ORAL | Status: DC

## 2015-07-04 MED ORDER — GLUCOSE BLOOD VI STRP: ORAL_STRIP | Status: DC

## 2015-07-04 MED ORDER — VERAPAMIL HCL 80 MG PO TABS: 80.0000 mg | ORAL_TABLET | Freq: Two times a day (BID) | ORAL | Status: DC

## 2015-07-04 MED ORDER — MOMETASONE FUROATE 50 MCG/ACT NA SUSP
2.0000 | Freq: Every day | NASAL | Status: DC
Start: 2015-07-04 — End: 2016-01-23

## 2015-07-04 MED ORDER — ROSUVASTATIN CALCIUM 5 MG PO TABS: 5.0000 mg | ORAL_TABLET | Freq: Every day | ORAL | Status: DC

## 2015-07-04 NOTE — Telephone Encounter (Signed)
Patient called requesting refills on her medications Patient is going out of town and needs them  Refills sent to [facility] on file

## 2015-07-04 NOTE — Telephone Encounter (Signed)
Pt calling to follow up on med refill request, please f/u with pt.

## 2015-12-18 ENCOUNTER — Other Ambulatory Visit: Payer: Self-pay | Admitting: Internal Medicine

## 2015-12-18 MED FILL — TRUE METRIX TEST STRIP: 25 days supply | Qty: 100 | Fill #0

## 2015-12-18 MED FILL — !TRUE METRIX BLOOD GLUCOSE: 1 days supply | Qty: 1 | Fill #0

## 2015-12-19 MED FILL — VERAPAMIL 80 MG TABLET: 80 | 30 days supply | Qty: 60 | Fill #0

## 2015-12-19 MED FILL — ROSUVASTATIN CAL 5 MG TAB: 5 | 30 days supply | Qty: 30 | Fill #0

## 2015-12-19 MED FILL — AMLODIPINE BESYLATE 10 MG T: 10 | 30 days supply | Qty: 30 | Fill #0

## 2016-01-09 ENCOUNTER — Emergency Department (HOSPITAL_COMMUNITY): Payer: Self-pay

## 2016-01-09 ENCOUNTER — Encounter (HOSPITAL_COMMUNITY): Payer: Self-pay | Admitting: *Deleted

## 2016-01-09 ENCOUNTER — Emergency Department (HOSPITAL_COMMUNITY)
Admission: EM | Admit: 2016-01-09 | Discharge: 2016-01-09 | Disposition: A | Discharge status: Home / Self Care | Payer: Self-pay | Attending: Emergency Medicine | Admitting: Emergency Medicine

## 2016-01-09 DIAGNOSIS — Z79899 Other long term (current) drug therapy: Secondary | ICD-10-CM | POA: Insufficient documentation

## 2016-01-09 DIAGNOSIS — E1140 Type 2 diabetes mellitus with diabetic neuropathy, unspecified: Secondary | ICD-10-CM | POA: Insufficient documentation

## 2016-01-09 DIAGNOSIS — M109 Gout, unspecified: Secondary | ICD-10-CM

## 2016-01-09 DIAGNOSIS — I110 Hypertensive heart disease with heart failure: Secondary | ICD-10-CM | POA: Insufficient documentation

## 2016-01-09 DIAGNOSIS — I5032 Chronic diastolic (congestive) heart failure: Secondary | ICD-10-CM | POA: Insufficient documentation

## 2016-01-09 DIAGNOSIS — E785 Hyperlipidemia, unspecified: Secondary | ICD-10-CM

## 2016-01-09 DIAGNOSIS — R05 Cough: Secondary | ICD-10-CM | POA: Insufficient documentation

## 2016-01-09 DIAGNOSIS — M10071 Idiopathic gout, right ankle and foot: Secondary | ICD-10-CM | POA: Insufficient documentation

## 2016-01-09 DIAGNOSIS — Z7982 Long term (current) use of aspirin: Secondary | ICD-10-CM | POA: Insufficient documentation

## 2016-01-09 DIAGNOSIS — Z7984 Long term (current) use of oral hypoglycemic drugs: Secondary | ICD-10-CM | POA: Insufficient documentation

## 2016-01-09 MED ORDER — NAPROXEN 500 MG PO TABS
500.0000 mg | ORAL_TABLET | Freq: Two times a day (BID) | ORAL | Status: DC
  Administered 2016-01-09: 500 mg via ORAL
  Filled 2016-01-09: qty 1

## 2016-01-09 MED ORDER — COLCHICINE 0.6 MG PO TABS
0.6000 mg | ORAL_TABLET | Freq: Once | ORAL | Status: AC
  Administered 2016-01-09: 0.6 mg via ORAL
  Filled 2016-01-09: qty 1

## 2016-01-09 MED ORDER — OXYCODONE HCL 5 MG PO TABS
2.5000 mg | ORAL_TABLET | Freq: Once | ORAL | Status: AC
  Administered 2016-01-09: 2.5 mg via ORAL
  Filled 2016-01-09: qty 1

## 2016-01-09 MED ORDER — ACETAMINOPHEN 500 MG PO TABS
1000.0000 mg | ORAL_TABLET | Freq: Once | ORAL | Status: AC
  Administered 2016-01-09: 500 mg via ORAL
  Filled 2016-01-09: qty 2

## 2016-01-09 MED ORDER — ROSUVASTATIN CALCIUM 5 MG PO TABS: 5.0000 mg | ORAL_TABLET | Freq: Every day | ORAL | Status: DC

## 2016-01-09 MED ORDER — COLCHICINE 0.6 MG PO TABS
1.2000 mg | ORAL_TABLET | Freq: Once | ORAL | Status: AC
  Administered 2016-01-09: 1.2 mg via ORAL
  Filled 2016-01-09: qty 2

## 2016-01-09 NOTE — ED Notes (Signed)
MD at bedside. 

## 2016-01-09 NOTE — Discharge Instructions (Signed)
Ask your family doctor about taking NSAIDs.  Take tylenol for pain. Return for fever, or increased redness to the foot.  Gout Gout is when your joints become red, sore, and swell (inflamed). This is caused by the buildup of uric acid crystals in the joints. Uric acid is a chemical that is normally in the blood. If the level of uric acid gets too high in the blood, these crystals form in your joints and tissues. Over time, these crystals can form into masses near the joints and tissues. These masses can destroy bone and cause the bone to look misshapen (deformed). HOME CARE   Do not take aspirin for pain.  Only take medicine as told by your doctor.  Rest the joint as much as you can. When in bed, keep sheets and blankets off painful areas.  Keep the sore joints raised (elevated).  Put warm or cold packs on painful joints. Use of warm or cold packs depends on which works best for you.  Use crutches if the painful joint is in your leg.  Drink enough fluids to keep your pee (urine) clear or pale yellow. Limit alcohol, sugary drinks, and drinks with fructose in them.  Follow your diet instructions. Pay careful attention to how much protein you eat. Include fruits, vegetables, whole grains, and fat-free or low-fat milk products in your daily diet. Talk to your doctor or dietitian about the use of coffee, vitamin C, and cherries. These may help lower uric acid levels.  Keep a healthy body weight. GET HELP RIGHT AWAY IF:   You have watery poop (diarrhea), throw up (vomit), or have any side effects from medicines.  You do not feel better in 24 hours, or you are getting worse.  Your joint becomes suddenly more tender, and you have chills or a fever. MAKE SURE YOU:   Understand these instructions.  Will watch your condition.  Will get help right away if you are not doing well or get worse.   This information is not intended to replace advice given to you by your health care provider. Make  sure you discuss any questions you have with your health care provider.   Document Released: 05/06/2008 Document Revised: 08/18/2014 Document Reviewed: 03/10/2012 Elsevier Interactive Patient Education [employer].

## 2016-01-09 NOTE — ED Provider Notes (Signed)
CSN: [id number]     Arrival date & time 01/09/16  8270 History   First MD Initiated Contact with Patient 01/09/16 0848     Chief Complaint  Patient presents with  . Foot Pain  . Cough     (Consider location/radiation/quality/duration/timing/severity/associated sxs/prior Treatment) Patient is a 70 y.o. female presenting with lower extremity pain and cough. The history is provided by the patient.  Foot Pain This is a new problem. The current episode started more than 1 week ago. The problem occurs constantly. The problem has not changed since onset.Pertinent negatives include no chest pain, no abdominal pain, no headaches and no shortness of breath. The symptoms are aggravated by walking and bending. Nothing relieves the symptoms. She has tried nothing for the symptoms.  Cough Associated symptoms: myalgias   Associated symptoms: no chest pain, no chills, no fever, no headaches, no rhinorrhea, no shortness of breath and no wheezing     70 yo F With a chief complaint of right foot pain. This been going on for about a week. Patient has a history of gout and feels that this feels similar. However she started taking her allopurinol and it did not get better so she thinks it probably is not gallop. Denies fevers or chills. Denies injury.  Past Medical History  Diagnosis Date  . Allergic rhinitis   . Cough   . GERD (gastroesophageal reflux disease)   . Diabetes mellitus   . Hypertension   . Hyperlipidemia   . History of colon polyps 2012    tubular adenoma   . Arthritis   . PONV (postoperative nausea and vomiting)     trouble waking up  . Diastolic CHF, chronic ([facility]) 10/11/2011  . Neuropathy ([facility]) 10/11/2011   Past Surgical History  Procedure Laterality Date  . Nasal turbinate reduction    . Lymphatic mass surgery    . Cataract extraction      right eye   Family History  Problem Relation Age of Onset  . Asthma Sister   . Colon cancer Neg Hx   . Hypertension Mother    Social  History  Substance Use Topics  . Smoking status: Never Smoker   . Smokeless tobacco: Never Used  . Alcohol Use: No   OB History    Gravida Para Term Preterm AB TAB SAB Ectopic Multiple Living   5 4 4  1 1    4      Review of Systems  Constitutional: Negative for fever and chills.  HENT: Negative for congestion and rhinorrhea.   Eyes: Negative for redness and visual disturbance.  Respiratory: Positive for cough. Negative for shortness of breath and wheezing.   Cardiovascular: Negative for chest pain and palpitations.  Gastrointestinal: Negative for nausea, vomiting and abdominal pain.  Genitourinary: Negative for dysuria and urgency.  Musculoskeletal: Positive for myalgias, arthralgias and gait problem.  Skin: Negative for pallor and wound.  Neurological: Negative for dizziness and headaches.      Allergies  Dust mite extract; Ace inhibitors; and Lovastatin  Home Medications   Prior to Admission medications   Medication Sig Start Date End Date Taking? Authorizing Provider  acetaminophen (TYLENOL) 500 MG tablet Take 500 mg by mouth every 6 (six) hours as needed for mild pain or headache.   Yes Historical Provider, MD  albuterol (PROVENTIL HFA;VENTOLIN HFA) 108 (90 BASE) MCG/ACT inhaler Inhale 2 puffs into the lungs every 6 (six) hours as needed. Shortness of breath 05/04/15  Yes Lance Bosch, NP  allopurinol (ZYLOPRIM) 100 MG tablet Take 1 tablet (100 mg total) by mouth daily. 12/27/14  Yes Lance Bosch, NP  amLODipine (NORVASC) 10 MG tablet Take 1 tablet (10 mg total) by mouth daily with breakfast. 07/04/15  Yes Lance Bosch, NP  aspirin 81 MG tablet Take 81 mg by mouth daily.     Yes Historical Provider, MD  Blood Glucose Monitoring Suppl (TRUERESULT BLOOD GLUCOSE) w/Device KIT USE AS DIRECTED TO CHECK BLOOD SUGAR. 12/18/15  Yes Tresa Garter, MD  cholecalciferol (VITAMIN D) 1000 units tablet Take 1,000 Units by mouth daily.   Yes Historical Provider, MD  diclofenac  sodium (VOLTAREN) 1 % GEL Apply 1 application topically 4 (four) times daily. 02/09/15  Yes Janne Napoleon, NP  glipiZIDE (GLIPIZIDE XL) 2.5 MG 24 hr tablet Take 1 tablet (2.5 mg total) by mouth daily with breakfast. For diabetes 07/04/15  Yes Lance Bosch, NP  glucose blood test strip Use as instructed 07/04/15  Yes Lance Bosch, NP  Lancets (FREESTYLE) lancets Use as instructed 12/13/14  Yes Lance Bosch, NP  mometasone (NASONEX) 50 MCG/ACT nasal spray Place 2 sprays into the nose daily. 07/04/15  Yes Lance Bosch, NP  pantoprazole (PROTONIX) 20 MG tablet Take 1 tablet (20 mg total) by mouth every morning. Patient taking differently: Take 20 mg by mouth daily as needed for heartburn or indigestion.  05/04/15  Yes Lance Bosch, NP  Polyethyl Glycol-Propyl Glycol (SYSTANE) 0.4-0.3 % SOLN Place 1 drop into both eyes 2 (two) times daily.   Yes Historical Provider, MD  traMADol (ULTRAM) 50 MG tablet Take 1 tablet (50 mg total) by mouth every 12 (twelve) hours as needed for moderate pain. 07/04/15  Yes Lance Bosch, NP  TRUETEST TEST test strip USE TO CHECK BLOOD AS DIRECTED BY PHYSICIAN 12/18/15  Yes Olugbemiga Essie Christine, MD  verapamil (CALAN) 80 MG tablet Take 1 tablet (80 mg total) by mouth 2 (two) times daily. 07/04/15  Yes Lance Bosch, NP  rosuvastatin (CRESTOR) 5 MG tablet Take 1 tablet (5 mg total) by mouth daily. 01/09/16   Deno Etienne, DO   BP 122/67 mmHg  Pulse 64  Temp(Src) 98.4 F (36.9 C) (Oral)  Resp 18  Ht 5' 3"  (1.6 m)  Wt 150 lb (68.04 kg)  BMI 26.58 kg/m2  SpO2 100%  LMP  (LMP Unknown) Physical Exam  Constitutional: She is oriented to person, place, and time. She appears well-developed and well-nourished. No distress.  HENT:  Head: Normocephalic and atraumatic.  Eyes: EOM are normal. Pupils are equal, round, and reactive to light.  Neck: Normal range of motion. Neck supple.  Cardiovascular: Normal rate and regular rhythm.  Exam reveals no gallop and no friction rub.   No  murmur heard. Pulmonary/Chest: Effort normal. She has no wheezes. She has no rales.  Abdominal: Soft. She exhibits no distension. There is no tenderness.  Musculoskeletal: She exhibits tenderness. She exhibits no edema.  Tenderness and swelling about the mid foot. No noted erythema no warmth. Noted swelling or erythema to the ankle. Full range of motion of the ankle. Pulse motor and sensation intact distally. No signs of trauma.  Neurological: She is alert and oriented to person, place, and time.  Skin: Skin is warm and dry. She is not diaphoretic.  Psychiatric: She has a normal mood and affect. Her behavior is normal.  Nursing note and vitals reviewed.   ED Course  Procedures (including critical care time) Labs Review  Labs Reviewed - No data to display  Imaging Review Dg Foot Complete Right  01/09/2016  CLINICAL DATA:  Pain for 6 days.  No history of trauma EXAM: RIGHT FOOT COMPLETE - 3+ VIEW COMPARISON:  None. FINDINGS: Frontal, oblique, and lateral views were obtained. There is no acute fracture or dislocation. There is mild joint space narrowing in the first MTP joint as well as in all PIP joints. Calcification in the medial aspect of the second DIP joint is likely of arthropathic etiology. No erosive change. There are minimal calcaneal spurs. IMPRESSION: No acute fracture or dislocation. 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 Minimal calcaneal spurs present. Electronically Signed   By: Lowella Grip III M.D.   On: 01/09/2016 09:39   I have personally reviewed and evaluated these images and lab results as part of my medical decision-making.   EKG Interpretation None      MDM   Final diagnoses:  Acute gout of right foot, unspecified cause    70 yo F with right foot pain. Patient has some swelling no noted findings on x-ray. Suspect gout based on patient's history. This could be early cellulitis however patient has no erythema or warmth to the skin and has been going on  for a week. Doubt septic arthritis. Given cholchicine seen here. We'll have her follow with her family physician.  10:31 AM:  I have discussed the diagnosis/risks/treatment options with the patient and family and believe the pt to be eligible for discharge home to follow-up with PCP. We also discussed returning to the ED immediately if new or worsening sx occur. We discussed the sx which are most concerning (e.g., sudden worsening pain, fever, inability to tolerate by mouth) that necessitate immediate return. Medications administered to the patient during their visit and any new prescriptions provided to the patient are listed below.  Medications given during this visit Medications  naproxen (NAPROSYN) tablet 500 mg (500 mg Oral Given 01/09/16 0906)  acetaminophen (TYLENOL) tablet 1,000 mg (500 mg Oral Given 01/09/16 0906)  oxyCODONE (Oxy IR/ROXICODONE) immediate release tablet 2.5 mg (2.5 mg Oral Given 01/09/16 0906)  colchicine tablet 1.2 mg (1.2 mg Oral Given 01/09/16 0937)  colchicine tablet 0.6 mg (0.6 mg Oral Given 01/09/16 1018)    Discharge Medication List as of 01/09/2016 10:03 AM      The patient appears reasonably screen and/or stabilized for discharge and I doubt any other medical condition or other [facility] requiring further screening, evaluation, or treatment in the ED at this time prior to discharge.      Deno Etienne, DO 01/09/16 1032

## 2016-01-09 NOTE — ED Notes (Signed)
Pt reports R foot pain since Friday.  Denies any injury at this time.  Pt reports she is unable to bear weight on her R foot d/t pain.  No swelling noted at this time.  Pt states she has DM.  Pt also reports non-productive cough x 1 week d/t nasal drainage.  Pt states she has been using nasonex but ran out.

## 2016-01-13 ENCOUNTER — Other Ambulatory Visit: Payer: Self-pay | Admitting: Internal Medicine

## 2016-01-14 ENCOUNTER — Other Ambulatory Visit: Payer: Self-pay | Admitting: Internal Medicine

## 2016-01-14 NOTE — Telephone Encounter (Signed)
Pt. Called requesting a refill on Tramadol. Please f/u °

## 2016-01-15 NOTE — Telephone Encounter (Signed)
Medication Refill: traMADol (ULTRAM) 50 MG tablet  Pt. Insistent on getting medication refill.  Pt. Has appointment scheduled for June 14th

## 2016-01-17 NOTE — Telephone Encounter (Signed)
Last visit was in 04/2015; will address refill at office visit when she comes in to establish with me.

## 2016-01-21 ENCOUNTER — Ambulatory Visit (HOSPITAL_COMMUNITY)
Admission: EM | Admit: 2016-01-21 | Discharge: 2016-01-21 | Disposition: A | Discharge status: Home / Self Care | Payer: No Typology Code available for payment source | Attending: Family Medicine | Admitting: Family Medicine

## 2016-01-21 ENCOUNTER — Encounter (HOSPITAL_COMMUNITY): Payer: Self-pay | Admitting: Emergency Medicine

## 2016-01-21 DIAGNOSIS — J301 Allergic rhinitis due to pollen: Secondary | ICD-10-CM

## 2016-01-21 MED ORDER — IPRATROPIUM BROMIDE 0.06 % NA SOLN: 2.0000 | Freq: Four times a day (QID) | NASAL | Status: DC

## 2016-01-21 NOTE — Discharge Instructions (Signed)
Go to your doctor for medication renewal.

## 2016-01-21 NOTE — ED Provider Notes (Signed)
CSN: [id number]     Arrival date & time 01/21/16  1257 History   First MD Initiated Contact with Patient 01/21/16 1335     Chief Complaint  Patient presents with  . Dizziness   (Consider location/radiation/quality/duration/timing/severity/associated sxs/prior Treatment) Patient is a 70 y.o. female presenting with dizziness. The history is provided by the patient.  Dizziness Quality:  Imbalance Severity:  Mild Onset quality:  Gradual Duration:  2 days Progression:  Unchanged Chronicity:  New Context: medication   Context comment:  Pt reports dizziness related to being out of her tramadol, advised she needs that rx from [place], also with head cong. Relieved by:  Being still Worsened by:  Being still Ineffective treatments:  Being still   Past Medical History  Diagnosis Date  . Allergic rhinitis   . Cough   . GERD (gastroesophageal reflux disease)   . Diabetes mellitus   . Hypertension   . Hyperlipidemia   . History of colon polyps 2012    tubular adenoma   . Arthritis   . PONV (postoperative nausea and vomiting)     trouble waking up  . Diastolic CHF, chronic ([place]) 10/11/2011  . Neuropathy ([place]) 10/11/2011   Past Surgical History  Procedure Laterality Date  . Nasal turbinate reduction    . Lymphatic mass surgery    . Cataract extraction      right eye   Family History  Problem Relation Age of Onset  . Asthma Sister   . Colon cancer Neg Hx   . Hypertension Mother    Social History  Substance Use Topics  . Smoking status: Never Smoker   . Smokeless tobacco: Never Used  . Alcohol Use: No   OB History    Gravida Para Term Preterm AB TAB SAB Ectopic Multiple Living   _0 Review of Systems  Constitutional: Negative.   HENT: Positive for congestion, postnasal drip and rhinorrhea.   Eyes: Negative.   Neurological: Positive for dizziness.  All other systems reviewed and are negative.   Allergies  Dust mite extract; Ace inhibitors; and  Lovastatin  Home Medications   Prior to Admission medications   Medication Sig Start Date End Date Taking? Authorizing Provider  acetaminophen (TYLENOL) 500 MG tablet Take 500 mg by mouth every 6 (six) hours as needed for mild pain or headache.   Yes Historical Provider, MD  allopurinol (ZYLOPRIM) 100 MG tablet Take 1 tablet (100 mg total) by mouth daily. 12/27/14  Yes Lance Bosch, NP  amLODipine (NORVASC) 10 MG tablet Take 1 tablet (10 mg total) by mouth daily with breakfast. 07/04/15  Yes Lance Bosch, NP  aspirin 81 MG tablet Take 81 mg by mouth daily.     Yes Historical Provider, MD  Blood Glucose Monitoring Suppl (TRUERESULT BLOOD GLUCOSE) w/Device KIT USE AS DIRECTED TO CHECK BLOOD SUGAR. 12/18/15  Yes Tresa Garter, MD  cholecalciferol (VITAMIN D) 1000 units tablet Take 1,000 Units by mouth daily.   Yes Historical Provider, MD  glipiZIDE (GLIPIZIDE XL) 2.5 MG 24 hr tablet Take 1 tablet (2.5 mg total) by mouth daily with breakfast. For diabetes 07/04/15  Yes Lance Bosch, NP  glucose blood test strip Use as instructed 07/04/15  Yes Lance Bosch, NP  Lancets (FREESTYLE) lancets Use as instructed 12/13/14  Yes Lance Bosch, NP  pantoprazole (PROTONIX) 20 MG tablet Take 1 tablet (20 mg total) by mouth every morning.  Patient taking differently: Take 20 mg by mouth daily as needed for heartburn or indigestion.  05/04/15  Yes Lance Bosch, NP  rosuvastatin (CRESTOR) 5 MG tablet Take 1 tablet (5 mg total) by mouth daily. 01/09/16  Yes Deno Etienne, DO  traMADol (ULTRAM) 50 MG tablet Take 1 tablet (50 mg total) by mouth every 12 (twelve) hours as needed for moderate pain. 07/04/15  Yes Lance Bosch, NP  TRUETEST TEST test strip USE TO CHECK BLOOD AS DIRECTED BY PHYSICIAN 12/18/15  Yes Olugbemiga Essie Christine, MD  verapamil (CALAN) 80 MG tablet Take 1 tablet (80 mg total) by mouth 2 (two) times daily. 07/04/15  Yes Lance Bosch, NP  albuterol (PROVENTIL HFA;VENTOLIN HFA) 108 (90 BASE)  MCG/ACT inhaler Inhale 2 puffs into the lungs every 6 (six) hours as needed. Shortness of breath 05/04/15   Lance Bosch, NP  diclofenac sodium (VOLTAREN) 1 % GEL Apply 1 application topically 4 (four) times daily. 02/09/15   Janne Napoleon, NP  ipratropium (ATROVENT) 0.06 % nasal spray Place 2 sprays into both nostrils 4 (four) times daily. 01/21/16   Billy Fischer, MD  mometasone (NASONEX) 50 MCG/ACT nasal spray Place 2 sprays into the nose daily. 07/04/15   Lance Bosch, NP  Polyethyl Glycol-Propyl Glycol (SYSTANE) 0.4-0.3 % SOLN Place 1 drop into both eyes 2 (two) times daily.    Historical Provider, MD   Meds Ordered and Administered this Visit  Medications - No data to display  BP 149/75 mmHg  Pulse 73  Temp(Src) 97.8 F (36.6 C) (Oral)  Resp 18  SpO2 98%  LMP  (LMP Unknown) No data found.   Physical Exam  Constitutional: She is oriented to person, place, and time. She appears well-developed and well-nourished.  HENT:  Right Ear: External ear normal.  Left Ear: External ear normal.  Nose: Nose normal.  Mouth/Throat: Oropharynx is clear and moist.  Eyes: Conjunctivae and EOM are normal. Pupils are equal, round, and reactive to light.  Neck: Normal range of motion. Neck supple.  Cardiovascular: Normal rate, regular rhythm, normal heart sounds and intact distal pulses.   Pulmonary/Chest: Effort normal and breath sounds normal.  Lymphadenopathy:    She has no cervical adenopathy.  Neurological: She is alert and oriented to person, place, and time.  Skin: Skin is warm and dry.  Nursing note and vitals reviewed.   ED Course  Procedures (including critical care time)  Labs Review Labs Reviewed - No data to display  Imaging Review No results found.   Visual Acuity Review  Right Eye Distance:   Left Eye Distance:   Bilateral Distance:    Right Eye Near:   Left Eye Near:    Bilateral Near:         MDM   1. Seasonal allergic rhinitis due to pollen      Billy Fischer, MD 01/21/16 1349

## 2016-01-21 NOTE — ED Notes (Signed)
The patient presented to the [facility] with a complaint of dizziness x 2 days and dry mouth x 3 weeks.

## 2016-01-23 ENCOUNTER — Ambulatory Visit: Payer: Self-pay | Attending: Family Medicine | Admitting: Family Medicine

## 2016-01-23 ENCOUNTER — Encounter: Payer: Self-pay | Admitting: Family Medicine

## 2016-01-23 VITALS — BP 152/81 | HR 67 | Temp 97.6°F | Resp 18 | Ht 63.0 in | Wt 149.8 lb

## 2016-01-23 DIAGNOSIS — E119 Type 2 diabetes mellitus without complications: Secondary | ICD-10-CM | POA: Insufficient documentation

## 2016-01-23 DIAGNOSIS — M545 Low back pain, unspecified: Secondary | ICD-10-CM

## 2016-01-23 DIAGNOSIS — M109 Gout, unspecified: Secondary | ICD-10-CM

## 2016-01-23 DIAGNOSIS — Z79899 Other long term (current) drug therapy: Secondary | ICD-10-CM | POA: Insufficient documentation

## 2016-01-23 DIAGNOSIS — M1A0790 Idiopathic chronic gout, unspecified ankle and foot, without tophus (tophi): Secondary | ICD-10-CM

## 2016-01-23 DIAGNOSIS — E785 Hyperlipidemia, unspecified: Secondary | ICD-10-CM | POA: Insufficient documentation

## 2016-01-23 DIAGNOSIS — I10 Essential (primary) hypertension: Secondary | ICD-10-CM | POA: Insufficient documentation

## 2016-01-23 DIAGNOSIS — Z7982 Long term (current) use of aspirin: Secondary | ICD-10-CM | POA: Insufficient documentation

## 2016-01-23 HISTORY — DX: Gout, unspecified: M10.9

## 2016-01-23 LAB — POCT GLYCOSYLATED HEMOGLOBIN (HGB A1C): Hemoglobin A1C: 6.7

## 2016-01-23 LAB — GLUCOSE, POCT (MANUAL RESULT ENTRY): POC Glucose: 105 mg/dl — AB (ref 70–99)

## 2016-01-23 MED ORDER — TRAMADOL HCL 50 MG PO TABS: 50.0000 mg | ORAL_TABLET | Freq: Two times a day (BID) | ORAL | Status: DC | PRN

## 2016-01-23 MED ORDER — MOMETASONE FUROATE 50 MCG/ACT NA SUSP: 2.0000 | Freq: Every day | NASAL | Status: DC

## 2016-01-23 MED ORDER — COLCHICINE 0.6 MG PO TABS: ORAL_TABLET | ORAL | Status: DC

## 2016-01-23 MED FILL — NASONEX 50 MCG NASAL SPRAY: 50 | 25 days supply | Qty: 17 | Fill #0

## 2016-01-23 MED FILL — traMADol HCL 50 MG TABS: 50 | 30 days supply | Qty: 60 | Fill #0

## 2016-01-23 NOTE — Progress Notes (Signed)
Subjective:  Patient ID: Paula [name], female    DOB: [birth year]  Age: 70 y.o. MRN: [id number]  CC: Follow-up   HPI Paula [name] is 70 year old female with a history of type 2 diabetes mellitus (A1c 6.7 from today), hypertension, hyperlipidemia, chronic joint pains and back pain from previous trauma several years ago who comes into the clinic to establish care with me as she was previously followed by a nurse practitioner who is no longer with the practice.  She has been out of the country on a trip to [name] and recently returned; she has been compliant with her medications. She denies hypoglycemic episodes or numbness in extremities. She is not up-to-date on an annual eye exam.  Her blood pressure is slightly elevated which she attributes to stress but states she has been compliant with her antihypertensives.  She reports a recent gout flare which is subsiding now with pain at 3/10 in her right big toe and right dorsum.  Lost her Medicare and no longer has any insurance. Needs a refill of tramadol and Nasonex.  Outpatient Prescriptions Prior to Visit  Medication Sig Dispense Refill  . acetaminophen (TYLENOL) 500 MG tablet Take 500 mg by mouth every 6 (six) hours as needed for mild pain or headache.    . albuterol (PROVENTIL HFA;VENTOLIN HFA) 108 (90 BASE) MCG/ACT inhaler Inhale 2 puffs into the lungs every 6 (six) hours as needed. Shortness of breath 18 g 2  . allopurinol (ZYLOPRIM) 100 MG tablet Take 1 tablet (100 mg total) by mouth daily. 30 tablet 2  . amLODipine (NORVASC) 10 MG tablet Take 1 tablet (10 mg total) by mouth daily with breakfast. 90 tablet 2  . aspirin 81 MG tablet Take 81 mg by mouth daily.      . Blood Glucose Monitoring Suppl (TRUERESULT BLOOD GLUCOSE) w/Device KIT USE AS DIRECTED TO CHECK BLOOD SUGAR. 1 each 0  . cholecalciferol (VITAMIN D) 1000 units tablet Take 1,000 Units by mouth daily.    . diclofenac sodium (VOLTAREN) 1 % GEL Apply 1 application  topically 4 (four) times daily. 100 g 0  . glipiZIDE (GLIPIZIDE XL) 2.5 MG 24 hr tablet Take 1 tablet (2.5 mg total) by mouth daily with breakfast. For diabetes 90 tablet 2  . glucose blood test strip Use as instructed 100 each 12  . ipratropium (ATROVENT) 0.06 % nasal spray Place 2 sprays into both nostrils 4 (four) times daily. 15 mL 1  . Lancets (FREESTYLE) lancets Use as instructed 100 each 12  . pantoprazole (PROTONIX) 20 MG tablet Take 1 tablet (20 mg total) by mouth every morning. (Patient taking differently: Take 20 mg by mouth daily as needed for heartburn or indigestion. ) 90 tablet 2  . rosuvastatin (CRESTOR) 5 MG tablet Take 1 tablet (5 mg total) by mouth daily. 30 tablet 0  . TRUETEST TEST test strip USE TO CHECK BLOOD AS DIRECTED BY PHYSICIAN 100 each 12  . verapamil (CALAN) 80 MG tablet Take 1 tablet (80 mg total) by mouth 2 (two) times daily. 180 tablet 4  . mometasone (NASONEX) 50 MCG/ACT nasal spray Place 2 sprays into the nose daily. 15 g 2  . traMADol (ULTRAM) 50 MG tablet Take 1 tablet (50 mg total) by mouth every 12 (twelve) hours as needed for moderate pain. 60 tablet 1  . Polyethyl Glycol-Propyl Glycol (SYSTANE) 0.4-0.3 % SOLN Place 1 drop into both eyes 2 (two) times daily.     No facility-administered medications prior to  visit.    ROS Review of Systems  Constitutional: Negative for activity change, appetite change and fatigue.  HENT: Negative for congestion, sinus pressure and sore throat.   Eyes: Negative for visual disturbance.  Respiratory: Negative for cough, chest tightness, shortness of breath and wheezing.   Cardiovascular: Negative for chest pain and palpitations.  Gastrointestinal: Negative for abdominal pain, constipation and abdominal distention.  Endocrine: Negative for polydipsia.  Genitourinary: Negative for dysuria and frequency.  Musculoskeletal:       Chronic joint pains  Skin: Negative for rash.  Neurological: Negative for tremors,  light-headedness and numbness.  Hematological: Does not bruise/bleed easily.  Psychiatric/Behavioral: Negative for behavioral problems and agitation.    Objective:  BP 152/81 mmHg  Pulse 67  Temp(Src) 97.6 F (36.4 C) (Oral)  Resp 18  Ht _0  (1.6 m)  Wt 149 lb 12.8 oz (67.949 kg)  BMI 26.54 kg/m2  SpO2 98%  LMP  (LMP Unknown)  BP/Weight 01/23/2016 01/21/2016 1/85/6314  Systolic BP 970 263 785  Diastolic BP 81 75 67  Wt. (Lbs) 149.8 - 150  BMI 26.54 - 26.58      Physical Exam  Constitutional: She is oriented to person, place, and time. She appears well-developed and well-nourished.  Cardiovascular: Normal rate, normal heart sounds and intact distal pulses.   No murmur heard. Pulmonary/Chest: Effort normal and breath sounds normal. She has no wheezes. She has no rales. She exhibits no tenderness.  Abdominal: Soft. Bowel sounds are normal. She exhibits no distension and no mass. There is no tenderness.  Musculoskeletal: Normal range of motion.  Neurological: She is alert and oriented to person, place, and time.  Skin: Skin is warm and dry.  Psychiatric: She has a normal mood and affect.     Lab Results  Component Value Date   HGBA1C 6.7 01/23/2016    CMP Latest Ref Rng 06/05/2015 06/05/2015 03/28/2015  Glucose 65 - 99 mg/dL - 95 97  BUN 7 - 25 mg/dL - 23 21  Creatinine 0.50 - 0.99 mg/dL - 1.32(H) 1.64(H)  Sodium 135 - 146 mmol/L - 138 139  Potassium 3.5 - 5.3 mmol/L - 3.7 4.9  Chloride 98 - 110 mmol/L - 100 101  CO2 20 - 31 mmol/L - 25 28  Calcium 8.4 - 10.5 mg/dL 9.2 9.2 10.0  Total Protein 6.1 - 8.1 g/dL - 7.5 7.4  Total Bilirubin 0.2 - 1.2 mg/dL - 0.3 0.5  Alkaline Phos 33 - 130 U/L - 88 73  AST 10 - 35 U/L - 21 21  ALT 6 - 29 U/L - 23 20     Lipid Panel     Component Value Date/Time   CHOL 249* 12/14/2014 0921   TRIG 164* 12/14/2014 0921   HDL 49 12/14/2014 0921   CHOLHDL 5.1 12/14/2014 0921   VLDL 33 12/14/2014 0921   LDLCALC 167* 12/14/2014 0921       Assessment & Plan:   1. Type 2 diabetes mellitus without complication, without long-term current use of insulin (HCC) Controlled with A1c of 6.7 Advised to schedule an annual eye exam - HgB A1c - Glucose (CBG) - COMPLETE METABOLIC PANEL WITH GFR; Future - Lipid panel; Future - Microalbumin / creatinine urine ratio; Future  2. Essential hypertension Uncontrolled We'll make no changes to regimen as BP was previously controlled at last office visit. Low-sodium, DASH diet  3. Midline low back pain without sciatica Previous history of trauma several years ago Remains on tramadol chronically Controlled substances  contract Discussed with the patient and signed - traMADol (ULTRAM) 50 MG tablet; Take 1 tablet (50 mg total) by mouth every 12 (twelve) hours as needed for moderate pain.  Dispense: 60 tablet; Refill: 1  4. Idiopathic chronic gout of foot without tophus, unspecified laterality Recently got over an acute flare. - Uric Acid; Future  5. Hyperlipidemia Uncontrolled, LDL from 12/2014 was 167 which is above goal of <100 Lipid panel ordered. Continue Crestor   Meds ordered this encounter  Medications  . traMADol (ULTRAM) 50 MG tablet    Sig: Take 1 tablet (50 mg total) by mouth every 12 (twelve) hours as needed for moderate pain.    Dispense:  60 tablet    Refill:  1  . mometasone (NASONEX) 50 MCG/ACT nasal spray    Sig: Place 2 sprays into the nose daily.    Dispense:  15 g    Refill:  2    Order Specific Question:  Lot Number?    Answer:  [id number]    Order Specific Question:  Expiration Date?    Answer:  06/11/2014    Order Specific Question:  Quantity    Answer:  2  . colchicine 0.6 MG tablet    Sig: Take 2 tabs (1.45m) at the onset of a gout attack, may repeat 1 tab (0.644m after 1 hour if symptoms persist.    Dispense:  30 tablet    Refill:  1    Follow-up: Return in about 3 months (around 04/24/2016) for follow up on Diabetes mellitus.   EnArnoldo MoraleMD

## 2016-01-23 NOTE — Patient Instructions (Signed)

## 2016-01-23 NOTE — Progress Notes (Signed)
Pt here for F/U for DM and HTN. Pt denies any pain today. Pt CBG is 105 and A1C is 6.7. Pt has taken medications today and needs refill tramadol and nasonex if the pharmacy has samples.

## 2016-01-25 ENCOUNTER — Ambulatory Visit: Payer: Self-pay | Attending: Internal Medicine

## 2016-01-25 DIAGNOSIS — E119 Type 2 diabetes mellitus without complications: Secondary | ICD-10-CM | POA: Insufficient documentation

## 2016-01-25 DIAGNOSIS — M1A0790 Idiopathic chronic gout, unspecified ankle and foot, without tophus (tophi): Secondary | ICD-10-CM

## 2016-01-25 LAB — COMPLETE METABOLIC PANEL WITH GFR
ALT: 18 U/L (ref 6–29)
AST: 18 U/L (ref 10–35)
Albumin: 4.2 g/dL (ref 3.6–5.1)
Alkaline Phosphatase: 80 U/L (ref 33–130)
BUN: 21 mg/dL (ref 7–25)
CO2: 28 mmol/L (ref 20–31)
Calcium: 9.7 mg/dL (ref 8.6–10.4)
Chloride: 103 mmol/L (ref 98–110)
Creat: 1.53 mg/dL — ABNORMAL HIGH (ref 0.50–0.99)
GFR, Est African American: 40 mL/min — ABNORMAL LOW (ref >=60)
GFR, Est Non African American: 34 mL/min — ABNORMAL LOW (ref >=60)
Glucose, Bld: 146 mg/dL — ABNORMAL HIGH (ref 65–99)
Potassium: 4 mmol/L (ref 3.5–5.3)
Sodium: 141 mmol/L (ref 135–146)
Total Bilirubin: 0.4 mg/dL (ref 0.2–1.2)
Total Protein: 7.6 g/dL (ref 6.1–8.1)

## 2016-01-25 LAB — LIPID PANEL
Cholesterol: 156 mg/dL (ref 125–200)
HDL: 54 mg/dL (ref >=46)
LDL Cholesterol: 82 mg/dL (ref <130)
Total CHOL/HDL Ratio: 2.9 Ratio (ref <=5.0)
Triglycerides: 98 mg/dL (ref <150)
VLDL: 20 mg/dL (ref <30)

## 2016-01-25 LAB — URIC ACID: Uric Acid, Serum: 7 mg/dL (ref 2.5–7.0)

## 2016-01-26 LAB — MICROALBUMIN / CREATININE URINE RATIO
Creatinine, Urine: 127 mg/dL (ref 20–320)
Microalb Creat Ratio: 535 mcg/mg creat — ABNORMAL HIGH (ref <30)
Microalb, Ur: 67.9 mg/dL — ABNORMAL HIGH

## 2016-01-30 ENCOUNTER — Telehealth: Payer: Self-pay

## 2016-01-30 MED FILL — PANTOPRAZOLE SOD DR 20 MG T: 20 | 30 days supply | Qty: 30 | Fill #2

## 2016-01-30 NOTE — Telephone Encounter (Signed)
-----   Message from Arnoldo Morale, MD sent at 01/28/2016  8:44 AM EDT ----- Cholesterol level is normal, uric acid which is an indicator of gout is normal. She does have some microalbuminuria and mildly elevated creatinine which could be secondary to diabetes and age related; advised increase fluid intake.

## 2016-01-30 NOTE — Telephone Encounter (Signed)
Writer called patient regarding her lab results.  Patient states an understanding about increasing her fluids due to her elevated creatinine and the albumin found in her urine.

## 2016-02-08 MED FILL — ROSUVASTATIN CAL 5 MG TAB: 5 | 30 days supply | Qty: 30 | Fill #1

## 2016-02-14 ENCOUNTER — Other Ambulatory Visit: Payer: Self-pay | Admitting: Pharmacist

## 2016-02-14 MED ORDER — ATORVASTATIN CALCIUM 10 MG PO TABS: 10.0000 mg | ORAL_TABLET | Freq: Every day | ORAL | Status: DC

## 2016-02-14 NOTE — Telephone Encounter (Signed)
Patient does not have insurance and is unable to afford Crestor (>$200 copay) so substituted atorvastatin 10 mg daily for $30. Patient has not tolerated lovastatin in the past.

## 2016-02-15 MED FILL — traMADol HCL 50 MG TABS: 50 | 30 days supply | Qty: 60 | Fill #1

## 2016-02-18 ENCOUNTER — Other Ambulatory Visit: Payer: Self-pay | Admitting: Internal Medicine

## 2016-02-18 MED FILL — AMLODIPINE BESYLATE 10 MG T: 10 | 30 days supply | Qty: 30 | Fill #0

## 2016-03-03 ENCOUNTER — Other Ambulatory Visit: Payer: Self-pay | Admitting: Internal Medicine

## 2016-03-03 DIAGNOSIS — E119 Type 2 diabetes mellitus without complications: Secondary | ICD-10-CM

## 2016-03-03 DIAGNOSIS — E785 Hyperlipidemia, unspecified: Secondary | ICD-10-CM

## 2016-03-03 DIAGNOSIS — I10 Essential (primary) hypertension: Secondary | ICD-10-CM

## 2016-03-03 MED FILL — ROSUVASTATIN CAL 5 MG TAB: 5 | 30 days supply | Qty: 30 | Fill #0 | Status: TO

## 2016-03-03 MED FILL — glipiZIDE XL 2.5 MG TB24: 2.5 | 30 days supply | Qty: 30 | Fill #0

## 2016-03-03 MED FILL — PANTOPRAZOLE SOD DR 20 MG T: 20 | 30 days supply | Qty: 30 | Fill #3

## 2016-03-03 NOTE — Telephone Encounter (Signed)
Rx request 

## 2016-03-03 NOTE — Telephone Encounter (Signed)
Patient came into facility requesting a 3 month supply for all her medications including albuterol (PROVENTIL HFA;VENTOLIN HFA) 108 (90 BASE) MCG/ACT inhaler.  Please follow up.

## 2016-03-03 NOTE — Telephone Encounter (Signed)
Rx requests 

## 2016-03-04 MED FILL — VERAPAMIL 80 MG TABLET: 80 | 60 days supply | Qty: 60 | Fill #0

## 2016-03-05 ENCOUNTER — Telehealth: Payer: Self-pay | Admitting: Family Medicine

## 2016-03-05 MED FILL — TRUE METRIX TEST STRIP: 25 days supply | Qty: 100 | Fill #1

## 2016-03-05 NOTE — Telephone Encounter (Signed)
Patient dropped off SCAT paperwork to be filled by the doctor

## 2016-03-05 NOTE — Telephone Encounter (Signed)
Patient would like 3 month supply of he medications . Patient did not specify.  Please follow up.

## 2016-03-06 ENCOUNTER — Telehealth: Payer: Self-pay

## 2016-03-06 NOTE — Telephone Encounter (Signed)
Patient calling for 3 month refill on her medications.  Writer reminded patient that she has an appt with MD on 03/11/16 and she can request refills at her next visit. Patient stated understanding and states that she didn't know that she had an appt with MD she thought it was in Sept.

## 2016-03-11 ENCOUNTER — Encounter: Payer: Self-pay | Admitting: Family Medicine

## 2016-03-11 ENCOUNTER — Ambulatory Visit: Payer: Self-pay | Attending: Family Medicine | Admitting: Family Medicine

## 2016-03-11 VITALS — BP 145/77 | HR 73 | Temp 98.3°F | Ht 63.0 in | Wt 151.4 lb

## 2016-03-11 DIAGNOSIS — F411 Generalized anxiety disorder: Secondary | ICD-10-CM

## 2016-03-11 DIAGNOSIS — Z794 Long term (current) use of insulin: Secondary | ICD-10-CM

## 2016-03-11 DIAGNOSIS — M545 Low back pain, unspecified: Secondary | ICD-10-CM

## 2016-03-11 DIAGNOSIS — E119 Type 2 diabetes mellitus without complications: Secondary | ICD-10-CM

## 2016-03-11 DIAGNOSIS — E785 Hyperlipidemia, unspecified: Secondary | ICD-10-CM

## 2016-03-11 DIAGNOSIS — Z889 Allergy status to unspecified drugs, medicaments and biological substances status: Secondary | ICD-10-CM

## 2016-03-11 DIAGNOSIS — I10 Essential (primary) hypertension: Secondary | ICD-10-CM

## 2016-03-11 DIAGNOSIS — R21 Rash and other nonspecific skin eruption: Secondary | ICD-10-CM

## 2016-03-11 MED ORDER — ROSUVASTATIN CALCIUM 5 MG PO TABS: 5.0000 mg | ORAL_TABLET | Freq: Every day | ORAL | 1 refills | Status: DC

## 2016-03-11 MED ORDER — GLIPIZIDE ER 5 MG PO TB24: 5.0000 mg | ORAL_TABLET | Freq: Every day | ORAL | 1 refills | Status: DC

## 2016-03-11 MED ORDER — AMLODIPINE BESYLATE 10 MG PO TABS: 10.0000 mg | ORAL_TABLET | Freq: Every day | ORAL | 1 refills | Status: DC

## 2016-03-11 MED ORDER — HYDROCORTISONE 1 % EX CREA: 1.0000 "application " | TOPICAL_CREAM | Freq: Two times a day (BID) | CUTANEOUS | 0 refills | Status: DC

## 2016-03-11 MED ORDER — ALBUTEROL SULFATE HFA 108 (90 BASE) MCG/ACT IN AERS: 2.0000 | INHALATION_SPRAY | Freq: Four times a day (QID) | RESPIRATORY_TRACT | 2 refills | Status: DC | PRN

## 2016-03-11 MED ORDER — TRAMADOL HCL 50 MG PO TABS: 50.0000 mg | ORAL_TABLET | Freq: Two times a day (BID) | ORAL | 1 refills | Status: DC | PRN

## 2016-03-11 MED ORDER — VERAPAMIL HCL 80 MG PO TABS: 80.0000 mg | ORAL_TABLET | Freq: Two times a day (BID) | ORAL | 1 refills | Status: DC

## 2016-03-11 MED FILL — ?GLIPIZIDE 5 MG TABLET: 5 | 30 days supply | Qty: 30 | Fill #0

## 2016-03-11 MED FILL — ?AMLODIPINE BESYLATE 10 MG: 10 | 30 days supply | Qty: 30 | Fill #0

## 2016-03-11 MED FILL — VENTOLIN HFA 90 MCG INHALER: 108 (90 BAS | 28 days supply | Qty: 18 | Fill #0

## 2016-03-11 NOTE — Progress Notes (Signed)
Subjective:  Patient ID: Paula [name], female    DOB: [birth year]  Age: 70 y.o. MRN: [id number]  CC: Diabetes (sugars have been "really high")   HPI Paula [name] is a 70 year old female with a history of type 2 diabetes mellitus (A1c 6.7), hypertension, gout, chronic back pain who presents to the clinic complaining of fluctuations in blood sugars.  She states her blood sugars have been in the 300s after eating (of note she checked blood sugar 1 hour after her meal). Her 7 day average is 171 and her 30 day average is 161; she remains on 2.5 mg of Glucotrol XL daily. Denies hypoglycemia.  Complains of palpitations at night which occur after she has had scary and weird dreams. She endorses being stressed by her daughter and symptoms have been on for the last 1 week but did not happen last night. Blood pressure is slightly elevated and she endorses compliance with her antihypertensive.   Past Medical History:  Diagnosis Date  . Allergic rhinitis   . Arthritis   . Cough   . Diabetes mellitus   . Diastolic CHF, chronic ([place]) 10/11/2011  . GERD (gastroesophageal reflux disease)   . History of colon polyps 2012   tubular adenoma   . Hyperlipidemia   . Hypertension   . Neuropathy ([place]) 10/11/2011  . PONV (postoperative nausea and vomiting)    trouble waking up    Past Surgical History:  Procedure Laterality Date  . CATARACT EXTRACTION     right eye  . lymphatic mass surgery    . NASAL TURBINATE REDUCTION      Allergies  Allergen Reactions  . Dust Mite Extract Cough  . Ace Inhibitors Cough    chronic cough  . Lovastatin Other (See Comments)    Generalized body pain     Outpatient Medications Prior to Visit  Medication Sig Dispense Refill  . acetaminophen (TYLENOL) 500 MG tablet Take 500 mg by mouth every 6 (six) hours as needed for mild pain or headache.    Marland Kitchen aspirin 81 MG tablet Take 81 mg by mouth daily.      . Blood Glucose Monitoring Suppl (TRUERESULT BLOOD  GLUCOSE) w/Device KIT USE AS DIRECTED TO CHECK BLOOD SUGAR. 1 each 0  . cholecalciferol (VITAMIN D) 1000 units tablet Take 1,000 Units by mouth daily.    . diclofenac sodium (VOLTAREN) 1 % GEL Apply 1 application topically 4 (four) times daily. 100 g 0  . glucose blood test strip Use as instructed 100 each 12  . Lancets (FREESTYLE) lancets Use as instructed 100 each 12  . mometasone (NASONEX) 50 MCG/ACT nasal spray Place 2 sprays into the nose daily. 15 g 2  . pantoprazole (PROTONIX) 20 MG tablet Take 1 tablet (20 mg total) by mouth every morning. (Patient taking differently: Take 20 mg by mouth daily as needed for heartburn or indigestion. ) 90 tablet 2  . Polyethyl Glycol-Propyl Glycol (SYSTANE) 0.4-0.3 % SOLN Place 1 drop into both eyes 2 (two) times daily.    . TRUETEST TEST test strip USE TO CHECK BLOOD AS DIRECTED BY PHYSICIAN 100 each 12  . albuterol (PROVENTIL HFA;VENTOLIN HFA) 108 (90 BASE) MCG/ACT inhaler Inhale 2 puffs into the lungs every 6 (six) hours as needed. Shortness of breath 18 g 2  . amLODipine (NORVASC) 10 MG tablet TAKE 1 TABLET BY MOUTH DAILY WITH BREAKFAST 90 tablet 0  . atorvastatin (LIPITOR) 10 MG tablet Take 1 tablet (10 mg total) by mouth daily.  90 tablet 0  . glipiZIDE (GLIPIZIDE XL) 2.5 MG 24 hr tablet Take 1 tablet (2.5 mg total) by mouth daily with breakfast. For diabetes 90 tablet 2  . rosuvastatin (CRESTOR) 5 MG tablet TAKE 1 TABLET BY MOUTH DAILY 90 tablet 0  . traMADol (ULTRAM) 50 MG tablet Take 1 tablet (50 mg total) by mouth every 12 (twelve) hours as needed for moderate pain. 60 tablet 1  . verapamil (CALAN) 80 MG tablet TAKE 1 TABLET BY MOUTH TWICE DAILY 180 tablet 0  . allopurinol (ZYLOPRIM) 100 MG tablet Take 1 tablet (100 mg total) by mouth daily. (Patient not taking: Reported on 03/11/2016) 30 tablet 2  . colchicine 0.6 MG tablet Take 2 tabs (1.45m) at the onset of a gout attack, may repeat 1 tab (0.663m after 1 hour if symptoms persist. (Patient not  taking: Reported on 03/11/2016) 30 tablet 1  . GLIPIZIDE XL 2.5 MG 24 hr tablet TAKE 1 TABLET BY MOUTH DAILY WITH BREAKFAST FOR DIABETES 90 tablet 0  . ipratropium (ATROVENT) 0.06 % nasal spray Place 2 sprays into both nostrils 4 (four) times daily. (Patient not taking: Reported on 03/11/2016) 15 mL 1   No facility-administered medications prior to visit.     ROS Review of Systems  Constitutional: Negative for activity change, appetite change and fatigue.  HENT: Negative for congestion, sinus pressure and sore throat.   Eyes: Negative for visual disturbance.  Respiratory: Negative for cough, chest tightness, shortness of breath and wheezing.   Cardiovascular: Positive for palpitations. Negative for chest pain.  Gastrointestinal: Negative for abdominal distention, abdominal pain and constipation.  Endocrine: Negative for polydipsia.  Genitourinary: Negative for dysuria and frequency.  Musculoskeletal: Negative for arthralgias and back pain.  Skin: Negative for rash.  Neurological: Negative for tremors, light-headedness and numbness.  Hematological: Does not bruise/bleed easily.  Psychiatric/Behavioral: Negative for agitation and behavioral problems.    Objective:  BP (!) 145/77 (BP Location: Left Arm, Patient Position: Sitting, Cuff Size: Small)   Pulse 73   Temp 98.3 F (36.8 C) (Oral)   Ht 5' 3"  (1.6 m)   Wt 151 lb 6.4 oz (68.7 kg)   LMP  (LMP Unknown)   SpO2 97%   BMI 26.82 kg/m   BP/Weight 03/11/2016 01/23/2016 01/17/92/7342Systolic BP [id number] BP 77 81 75  Wt. (Lbs) 151.4 149.8 -  BMI 26.82 26.54 -      Physical Exam  Constitutional: She is oriented to person, place, and time. She appears well-developed and well-nourished.  Cardiovascular: Normal rate, normal heart sounds and intact distal pulses.   No murmur heard. Pulmonary/Chest: Effort normal and breath sounds normal. She has no wheezes. She has no rales. She exhibits no tenderness.  Abdominal: Soft.  Bowel sounds are normal. She exhibits no distension and no mass. There is no tenderness.  Musculoskeletal: Normal range of motion.  Neurological: She is alert and oriented to person, place, and time.  Skin: Skin is warm and dry.  Psychiatric: She has a normal mood and affect.     Assessment & Plan:   1. H/O seasonal allergies - albuterol (PROVENTIL HFA;VENTOLIN HFA) 108 (90 Base) MCG/ACT inhaler; Inhale 2 puffs into the lungs every 6 (six) hours as needed. Shortness of breath  Dispense: 18 g; Refill: 2  2. Type 2 diabetes mellitus without complication, with long-term current use of insulin (HCC) Controlled with A1c of 6.7 Due to complaints of fluctuations in blood sugar I am raising glipizide from  2.5 mg to 5 mg daily We'll review her blood sugar log at her next visit - glipiZIDE (GLUCOTROL XL) 5 MG 24 hr tablet; Take 1 tablet (5 mg total) by mouth daily with breakfast. For diabetes  Dispense: 90 tablet; Refill: 1  3. Hyperlipidemia Scheduled for lipid panel - rosuvastatin (CRESTOR) 5 MG tablet; Take 1 tablet (5 mg total) by mouth daily.  Dispense: 90 tablet; Refill: 1  4. Midline low back pain without sciatica Currently on chronic tramadol - traMADol (ULTRAM) 50 MG tablet; Take 1 tablet (50 mg total) by mouth every 12 (twelve) hours as needed for moderate pain.  Dispense: 60 tablet; Refill: 1  5. Essential hypertension Slightly above goal of less than 140/90 Low-sodium diet, DASH diet, lifestyle modification - amLODipine (NORVASC) 10 MG tablet; Take 1 tablet (10 mg total) by mouth daily with breakfast.  Dispense: 90 tablet; Refill: 1 - verapamil (CALAN) 80 MG tablet; Take 1 tablet (80 mg total) by mouth 2 (two) times daily.  Dispense: 180 tablet; Refill: 1  6. Rash and nonspecific skin eruption Uses triamcinolone on her facial rash and I have advised him that unless for 10 steroid will be safer for her face  7. Anxiety state Could explain palpitations and weird dreams at  night. If she continues to have symptoms I will place on clonidine as needed at bedtime.   Meds ordered this encounter  Medications  . albuterol (PROVENTIL HFA;VENTOLIN HFA) 108 (90 Base) MCG/ACT inhaler    Sig: Inhale 2 puffs into the lungs every 6 (six) hours as needed. Shortness of breath    Dispense:  18 g    Refill:  2  . hydrocortisone cream 1 %    Sig: Apply 1 application topically 2 (two) times daily.    Dispense:  30 g    Refill:  0  . amLODipine (NORVASC) 10 MG tablet    Sig: Take 1 tablet (10 mg total) by mouth daily with breakfast.    Dispense:  90 tablet    Refill:  1  . glipiZIDE (GLUCOTROL XL) 5 MG 24 hr tablet    Sig: Take 1 tablet (5 mg total) by mouth daily with breakfast. For diabetes    Dispense:  90 tablet    Refill:  1    Discontinue previous dose  . rosuvastatin (CRESTOR) 5 MG tablet    Sig: Take 1 tablet (5 mg total) by mouth daily.    Dispense:  90 tablet    Refill:  1  . traMADol (ULTRAM) 50 MG tablet    Sig: Take 1 tablet (50 mg total) by mouth every 12 (twelve) hours as needed for moderate pain.    Dispense:  60 tablet    Refill:  1  . verapamil (CALAN) 80 MG tablet    Sig: Take 1 tablet (80 mg total) by mouth 2 (two) times daily.    Dispense:  180 tablet    Refill:  1    Follow-up: Return in about 2 weeks (around 03/25/2016) for Follow-up on diabetes mellitus.   Arnoldo Morale MD

## 2016-03-11 NOTE — Patient Instructions (Signed)
Diabetes Mellitus and Food It is important for you to manage your blood sugar (glucose) level. Your blood glucose level can be greatly affected by what you eat. Eating healthier foods in the appropriate amounts throughout the day at about the same time each day will help you control your blood glucose level. It can also help slow or prevent worsening of your diabetes mellitus. Healthy eating may even help you improve the level of your blood pressure and reach or maintain a healthy weight.  General recommendations for healthful eating and cooking habits include:  Eating meals and snacks regularly. Avoid going long periods of time without eating to lose weight.  Eating a diet that consists mainly of plant-based foods, such as fruits, vegetables, nuts, legumes, and whole grains.  Using low-heat cooking methods, such as baking, instead of high-heat cooking methods, such as deep frying. Work with your dietitian to make sure you understand how to use the Nutrition Facts information on food labels. HOW CAN FOOD AFFECT ME? Carbohydrates Carbohydrates affect your blood glucose level more than any other type of food. Your dietitian will help you determine how many carbohydrates to eat at each meal and teach you how to count carbohydrates. Counting carbohydrates is important to keep your blood glucose at a healthy level, especially if you are using insulin or taking certain medicines for diabetes mellitus. Alcohol Alcohol can cause sudden decreases in blood glucose (hypoglycemia), especially if you use insulin or take certain medicines for diabetes mellitus. Hypoglycemia can be a life-threatening condition. Symptoms of hypoglycemia (sleepiness, dizziness, and disorientation) are similar to symptoms of having too much alcohol.  If your health care provider has given you approval to drink alcohol, do so in moderation and use the following guidelines:  Women should not have more than one drink per day, and men  should not have more than two drinks per day. One drink is equal to:  12 oz of beer.  5 oz of wine.  1 oz of hard liquor.  Do not drink on an empty stomach.  Keep yourself hydrated. Have water, diet soda, or unsweetened iced tea.  Regular soda, juice, and other mixers might contain a lot of carbohydrates and should be counted. WHAT FOODS ARE NOT RECOMMENDED? As you make food choices, it is important to remember that all foods are not the same. Some foods have fewer nutrients per serving than other foods, even though they might have the same number of calories or carbohydrates. It is difficult to get your body what it needs when you eat foods with fewer nutrients. Examples of foods that you should avoid that are high in calories and carbohydrates but low in nutrients include:  Trans fats (most processed foods list trans fats on the Nutrition Facts label).  Regular soda.  Juice.  Candy.  Sweets, such as cake, pie, doughnuts, and cookies.  Fried foods. WHAT FOODS CAN I EAT? Eat nutrient-rich foods, which will nourish your body and keep you healthy. The food you should eat also will depend on several factors, including:  The calories you need.  The medicines you take.  Your weight.  Your blood glucose level.  Your blood pressure level.  Your cholesterol level. You should eat a variety of foods, including:  Protein.  Lean cuts of meat.  Proteins low in saturated fats, such as fish, egg whites, and beans. Avoid processed meats.  Fruits and vegetables.  Fruits and vegetables that may help control blood glucose levels, such as apples, mangoes, and   yams.  Dairy products.  Choose fat-free or low-fat dairy products, such as milk, yogurt, and cheese.  Grains, bread, pasta, and rice.  Choose whole grain products, such as multigrain bread, whole oats, and brown rice. These foods may help control blood pressure.  Fats.  Foods containing healthful fats, such as nuts,  avocado, olive oil, canola oil, and fish. DOES EVERYONE WITH DIABETES MELLITUS HAVE THE SAME MEAL PLAN? Because every person with diabetes mellitus is different, there is not one meal plan that works for everyone. It is very important that you meet with a dietitian who will help you create a meal plan that is just right for you.   This information is not intended to replace advice given to you by your health care provider. Make sure you discuss any questions you have with your health care provider.   Document Released: 04/24/2005 Document Revised: 08/18/2014 Document Reviewed: 06/24/2013 Elsevier Interactive Patient Education 2016 [employer]  

## 2016-03-11 NOTE — Progress Notes (Signed)
Woke up in the middle of the night and her "heart was pounding" for one week- did not happen last night.  Admits to being under a lot of stress.  Sugars are a fluctuating a lot.  Would like three months supply of meds  Would like triamcinolone cream 0.1%

## 2016-03-17 MED FILL — traMADol HCL 50 MG TABS: 50 | 30 days supply | Qty: 60 | Fill #0

## 2016-03-25 ENCOUNTER — Encounter: Payer: Self-pay | Admitting: Family Medicine

## 2016-03-25 ENCOUNTER — Ambulatory Visit: Payer: Self-pay | Attending: Family Medicine | Admitting: Family Medicine

## 2016-03-25 VITALS — BP 127/76 | HR 70 | Temp 98.0°F | Ht 63.0 in | Wt 150.2 lb

## 2016-03-25 DIAGNOSIS — G4700 Insomnia, unspecified: Secondary | ICD-10-CM | POA: Insufficient documentation

## 2016-03-25 DIAGNOSIS — I10 Essential (primary) hypertension: Secondary | ICD-10-CM

## 2016-03-25 DIAGNOSIS — E119 Type 2 diabetes mellitus without complications: Secondary | ICD-10-CM

## 2016-03-25 NOTE — Progress Notes (Signed)
Subjective:    Patient ID: Paula [name], female    DOB: [birth year], 70 y.o.   MRN: [id number]  HPI Paula [name] is a 70 year old female with a history of type 2 diabetes mellitus (A1c 6.7), hypertension, gout, chronic back pain who presents to the clinic for follow-up of blood sugars after her glipizide had been increased from 2.5 mg to 5 mg daily due to fluctuations in blood sugars with hyperglycemia  up to 300. She had to cut back her dose to 2.5 mg as she noticed hypoglycemia with the 5 mg. Endorses ingestion high carbohydrate meals and is working on her diet. Blood sugar log reviewed today reveals fasting sugars in the 110-166 range and random sugars less than 195.  Had complains of palpitations at night which occur after she has had scary and weird dreams. She endorses being stressed by her daughter; she had refused initiation of clonidine at her last visit and comes into the clinic wondering if she can take melatonin for insomnia which I have advised that she can.  She has no other complaints today.  Past Medical History:  Diagnosis Date  . Allergic rhinitis   . Arthritis   . Cough   . Diabetes mellitus   . Diastolic CHF, chronic ([place]) 10/11/2011  . GERD (gastroesophageal reflux disease)   . History of colon polyps 2012   tubular adenoma   . Hyperlipidemia   . Hypertension   . Neuropathy ([place]) 10/11/2011  . PONV (postoperative nausea and vomiting)    trouble waking up    Past Surgical History:  Procedure Laterality Date  . CATARACT EXTRACTION     right eye  . lymphatic mass surgery    . NASAL TURBINATE REDUCTION      Allergies  Allergen Reactions  . Dust Mite Extract Cough  . Ace Inhibitors Cough    chronic cough  . Lovastatin Other (See Comments)    Generalized body pain    Current Outpatient Prescriptions on File Prior to Visit  Medication Sig Dispense Refill  . acetaminophen (TYLENOL) 500 MG tablet Take 500 mg by mouth every 6 (six) hours as  needed for mild pain or headache.    . albuterol (PROVENTIL HFA;VENTOLIN HFA) 108 (90 Base) MCG/ACT inhaler Inhale 2 puffs into the lungs every 6 (six) hours as needed. Shortness of breath 18 g 2  . amLODipine (NORVASC) 10 MG tablet Take 1 tablet (10 mg total) by mouth daily with breakfast. 90 tablet 1  . aspirin 81 MG tablet Take 81 mg by mouth daily.      . Blood Glucose Monitoring Suppl (TRUERESULT BLOOD GLUCOSE) w/Device KIT USE AS DIRECTED TO CHECK BLOOD SUGAR. 1 each 0  . cholecalciferol (VITAMIN D) 1000 units tablet Take 1,000 Units by mouth daily.    . diclofenac sodium (VOLTAREN) 1 % GEL Apply 1 application topically 4 (four) times daily. 100 g 0  . glipiZIDE (GLUCOTROL XL) 5 MG 24 hr tablet Take 1 tablet (5 mg total) by mouth daily with breakfast. For diabetes 90 tablet 1  . glucose blood test strip Use as instructed 100 each 12  . Lancets (FREESTYLE) lancets Use as instructed 100 each 12  . mometasone (NASONEX) 50 MCG/ACT nasal spray Place 2 sprays into the nose daily. 15 g 2  . pantoprazole (PROTONIX) 20 MG tablet Take 1 tablet (20 mg total) by mouth every morning. (Patient taking differently: Take 20 mg by mouth daily as needed for heartburn or indigestion. ) 90  tablet 2  . Polyethyl Glycol-Propyl Glycol (SYSTANE) 0.4-0.3 % SOLN Place 1 drop into both eyes 2 (two) times daily.    . rosuvastatin (CRESTOR) 5 MG tablet Take 1 tablet (5 mg total) by mouth daily. 90 tablet 1  . traMADol (ULTRAM) 50 MG tablet Take 1 tablet (50 mg total) by mouth every 12 (twelve) hours as needed for moderate pain. 60 tablet 1  . TRUETEST TEST test strip USE TO CHECK BLOOD AS DIRECTED BY PHYSICIAN 100 each 12  . verapamil (CALAN) 80 MG tablet Take 1 tablet (80 mg total) by mouth 2 (two) times daily. 180 tablet 1  . hydrocortisone cream 1 % Apply 1 application topically 2 (two) times daily. (Patient not taking: Reported on 03/25/2016) 30 g 0   No current facility-administered medications on file prior to  visit.      Review of Systems  Constitutional: Negative for activity change, appetite change and fatigue.  HENT: Negative for congestion, sinus pressure and sore throat.   Eyes: Negative for visual disturbance.  Respiratory: Negative for cough, chest tightness, shortness of breath and wheezing.   Cardiovascular: Positive for palpitations. Negative for chest pain.  Gastrointestinal: Negative for abdominal distention, abdominal pain and constipation.  Endocrine: Negative for polydipsia.  Genitourinary: Negative for dysuria and frequency.  Musculoskeletal: Negative for arthralgias and back pain.  Skin: Negative for rash.  Neurological: Negative for tremors, light-headedness and numbness.  Hematological: Does not bruise/bleed easily.  Psychiatric/Behavioral: Positive for sleep disturbance. Negative for agitation and behavioral problems.       Objective: Vitals:   03/25/16 1115  BP: 127/76  Pulse: 70  Temp: 98 F (36.7 C)  TempSrc: Oral  SpO2: 100%  Weight: 150 lb 3.2 oz (68.1 kg)  Height: 5' 3"  (1.6 m)      Physical Exam  Constitutional: She is oriented to person, place, and time. She appears well-developed and well-nourished.  Cardiovascular: Normal rate, normal heart sounds and intact distal pulses.   No murmur heard. Pulmonary/Chest: Effort normal and breath sounds normal. She has no wheezes. She has no rales. She exhibits no tenderness.  Abdominal: Soft. Bowel sounds are normal. She exhibits no distension and no mass. There is no tenderness.  Musculoskeletal: Normal range of motion.  Neurological: She is alert and oriented to person, place, and time.    Lab Results  Component Value Date   HGBA1C 6.7 01/23/2016    Lipid Panel     Component Value Date/Time   CHOL 156 01/25/2016 0901   TRIG 98 01/25/2016 0901   HDL 54 01/25/2016 0901   CHOLHDL 2.9 01/25/2016 0901   VLDL 20 01/25/2016 0901   LDLCALC 82 01/25/2016 0901    CMP Latest Ref Rng & Units 01/25/2016  06/05/2015 06/05/2015  Glucose 65 - 99 mg/dL 146(H) - 95  BUN 7 - 25 mg/dL 21 - 23  Creatinine 0.50 - 0.99 mg/dL 1.53(H) - 1.32(H)  Sodium 135 - 146 mmol/L 141 - 138  Potassium 3.5 - 5.3 mmol/L 4.0 - 3.7  Chloride 98 - 110 mmol/L 103 - 100  CO2 20 - 31 mmol/L 28 - 25  Calcium 8.6 - 10.4 mg/dL 9.7 9.2 9.2  Total Protein 6.1 - 8.1 g/dL 7.6 - 7.5  Total Bilirubin 0.2 - 1.2 mg/dL 0.4 - 0.3  Alkaline Phos 33 - 130 U/L 80 - 88  AST 10 - 35 U/L 18 - 21  ALT 6 - 29 U/L 18 - 23  Assessment & Plan:   1. Type 2 diabetes mellitus without complication, with long-term current use of insulin (HCC) Controlled with A1c of 6.7 Continue 2.49m as 534mcauses hypoglycemia We'll review her blood sugar log at her next visit  2. Hyperlipidemia Controlled - rosuvastatin (CRESTOR) 5 MG tablet; Take 1 tablet (5 mg total) by mouth daily.  Dispense: 90 tablet; Refill: 1  3. Essential hypertension Controlled - amLODipine (NORVASC) 10 MG tablet; Take 1 tablet (10 mg total) by mouth daily with breakfast.  Dispense: 90 tablet; Refill: 1 - verapamil (CALAN) 80 MG tablet; Take 1 tablet (80 mg total) by mouth 2 (two) times daily.  Dispense: 180 tablet; Refill: 1  4. Insomnia and Anxiety state Unable to place on clonidine due to interaction with verapamil. Advised to use melatonin to help with insomnia Holding off on SSRI at this time due to patient declining.   This note has been created with [employer]veyor, quantityAny transcriptional errors are unintentional.

## 2016-03-25 NOTE — Patient Instructions (Signed)
Insomnia Insomnia is a sleep disorder that makes it difficult to fall asleep or to stay asleep. Insomnia can cause tiredness (fatigue), low energy, difficulty concentrating, mood swings, and poor performance at work or school.  There are three different ways to classify insomnia:  Difficulty falling asleep.  Difficulty staying asleep.  Waking up too early in the morning. Any type of insomnia can be long-term (chronic) or short-term (acute). Both are common. Short-term insomnia usually lasts for three months or less. Chronic insomnia occurs at least three times a week for longer than three months. CAUSES  Insomnia may be caused by another condition, situation, or substance, such as:  Anxiety.  Certain medicines.  Gastroesophageal reflux disease (GERD) or other gastrointestinal conditions.  Asthma or other breathing conditions.  Restless legs syndrome, sleep apnea, or other sleep disorders.  Chronic pain.  Menopause. This may include hot flashes.  Stroke.  Abuse of alcohol, tobacco, or illegal drugs.  Depression.  Caffeine.   Neurological disorders, such as Alzheimer disease.  An overactive thyroid (hyperthyroidism). The cause of insomnia may not be known. RISK FACTORS Risk factors for insomnia include:  Gender. Women are more commonly affected than men.  Age. Insomnia is more common as you get older.  Stress. This may involve your professional or personal life.  Income. Insomnia is more common in people with lower income.  Lack of exercise.   Irregular work schedule or night shifts.  Traveling between different time zones. SIGNS AND SYMPTOMS If you have insomnia, trouble falling asleep or trouble staying asleep is the main symptom. This may lead to other symptoms, such as:  Feeling fatigued.  Feeling nervous about going to sleep.  Not feeling rested in the morning.  Having trouble concentrating.  Feeling irritable, anxious, or depressed. TREATMENT   Treatment for insomnia depends on the cause. If your insomnia is caused by an underlying condition, treatment will focus on addressing the condition. Treatment may also include:   Medicines to help you sleep.  Counseling or therapy.  Lifestyle adjustments. HOME CARE INSTRUCTIONS   Take medicines only as directed by your health care provider.  Keep regular sleeping and waking hours. Avoid naps.  Keep a sleep diary to help you and your health care provider figure out what could be causing your insomnia. Include:   When you sleep.  When you wake up during the night.  How well you sleep.   How rested you feel the next day.  Any side effects of medicines you are taking.  What you eat and drink.   Make your bedroom a comfortable place where it is easy to fall asleep:  Put up shades or special blackout curtains to block light from outside.  Use a white noise machine to block noise.  Keep the temperature cool.   Exercise regularly as directed by your health care provider. Avoid exercising right before bedtime.  Use relaxation techniques to manage stress. Ask your health care provider to suggest some techniques that may work well for you. These may include:  Breathing exercises.  Routines to release muscle tension.  Visualizing peaceful scenes.  Cut back on alcohol, caffeinated beverages, and cigarettes, especially close to bedtime. These can disrupt your sleep.  Do not overeat or eat spicy foods right before bedtime. This can lead to digestive discomfort that can make it hard for you to sleep.  Limit screen use before bedtime. This includes:  Watching TV.  Using your smartphone, tablet, and computer.  Stick to a routine. This   can help you fall asleep faster. Try to do a quiet activity, brush your teeth, and go to bed at the same time each night.  Get out of bed if you are still awake after 15 minutes of trying to sleep. Keep the lights down, but try reading or  doing a quiet activity. When you feel sleepy, go back to bed.  Make sure that you drive carefully. Avoid driving if you feel very sleepy.  Keep all follow-up appointments as directed by your health care provider. This is important. SEEK MEDICAL CARE IF:   You are tired throughout the day or have trouble in your daily routine due to sleepiness.  You continue to have sleep problems or your sleep problems get worse. SEEK IMMEDIATE MEDICAL CARE IF:   You have serious thoughts about hurting yourself or someone else.   This information is not intended to replace advice given to you by your health care provider. Make sure you discuss any questions you have with your health care provider.   Document Released: 07/25/2000 Document Revised: 04/18/2015 Document Reviewed: 04/28/2014 Elsevier Interactive Patient Education 2016 [employer]  

## 2016-04-02 ENCOUNTER — Telehealth: Payer: Self-pay | Admitting: Family Medicine

## 2016-04-02 NOTE — Telephone Encounter (Signed)
Pt called requesting referral to cardiology, pt states she still continues with fast heart rate and is concerned. Please f/up

## 2016-04-03 NOTE — Telephone Encounter (Signed)
Please schedule an office visit for her as she needs work up including EKG and labs prior to Cardiology referral.

## 2016-04-07 MED FILL — VERAPAMIL 80 MG TABLET: 80 | 30 days supply | Qty: 60 | Fill #0

## 2016-04-08 NOTE — Telephone Encounter (Signed)
Patient called and advised per Dr. Jarold Song: need office visit before referral to specialist.  Appointment made for September 18th at 4:00pm.  No further action needed at this time. Priscille Heidelberg, RN, BSN

## 2016-04-11 MED FILL — PANTOPRAZOLE SOD DR 20 MG T: 20 | 90 days supply | Qty: 90 | Fill #4

## 2016-04-11 MED FILL — TRUE METRIX TEST STRIP: 25 days supply | Qty: 100 | Fill #2

## 2016-04-15 MED FILL — traMADol HCL 50 MG TABS: 50 | 30 days supply | Qty: 60 | Fill #1

## 2016-04-24 MED FILL — ROSUVASTATIN CAL 5 MG TAB: 5 | 30 days supply | Qty: 30 | Fill #1 | Status: TO

## 2016-04-24 MED FILL — glipiZIDE XL 2.5 MG TB24: 2.5 | 30 days supply | Qty: 30 | Fill #1 | Status: TO

## 2016-04-25 LAB — GLUCOSE, POCT (MANUAL RESULT ENTRY): POC Glucose: 115 mg/dl — AB (ref 70–99)

## 2016-04-25 LAB — POCT GLYCOSYLATED HEMOGLOBIN (HGB A1C): Hemoglobin A1C: 8.1

## 2016-04-28 ENCOUNTER — Ambulatory Visit: Payer: Self-pay | Attending: Family Medicine | Admitting: Family Medicine

## 2016-04-28 ENCOUNTER — Encounter: Payer: Self-pay | Admitting: Family Medicine

## 2016-04-28 ENCOUNTER — Other Ambulatory Visit: Payer: Self-pay

## 2016-04-28 VITALS — BP 129/65 | HR 72 | Temp 98.7°F | Resp 18 | Ht 63.0 in | Wt 154.0 lb

## 2016-04-28 DIAGNOSIS — Z0000 Encounter for general adult medical examination without abnormal findings: Secondary | ICD-10-CM | POA: Insufficient documentation

## 2016-04-28 DIAGNOSIS — E1140 Type 2 diabetes mellitus with diabetic neuropathy, unspecified: Secondary | ICD-10-CM | POA: Insufficient documentation

## 2016-04-28 DIAGNOSIS — E1122 Type 2 diabetes mellitus with diabetic chronic kidney disease: Secondary | ICD-10-CM | POA: Insufficient documentation

## 2016-04-28 DIAGNOSIS — Z79899 Other long term (current) drug therapy: Secondary | ICD-10-CM | POA: Insufficient documentation

## 2016-04-28 DIAGNOSIS — N183 Chronic kidney disease, stage 3 (moderate): Secondary | ICD-10-CM | POA: Insufficient documentation

## 2016-04-28 DIAGNOSIS — Z7984 Long term (current) use of oral hypoglycemic drugs: Secondary | ICD-10-CM | POA: Insufficient documentation

## 2016-04-28 DIAGNOSIS — R002 Palpitations: Secondary | ICD-10-CM | POA: Insufficient documentation

## 2016-04-28 DIAGNOSIS — E119 Type 2 diabetes mellitus without complications: Secondary | ICD-10-CM

## 2016-04-28 DIAGNOSIS — Z23 Encounter for immunization: Secondary | ICD-10-CM | POA: Insufficient documentation

## 2016-04-28 DIAGNOSIS — E1149 Type 2 diabetes mellitus with other diabetic neurological complication: Secondary | ICD-10-CM

## 2016-04-28 DIAGNOSIS — Z7982 Long term (current) use of aspirin: Secondary | ICD-10-CM | POA: Insufficient documentation

## 2016-04-28 DIAGNOSIS — I129 Hypertensive chronic kidney disease with stage 1 through stage 4 chronic kidney disease, or unspecified chronic kidney disease: Secondary | ICD-10-CM | POA: Insufficient documentation

## 2016-04-28 DIAGNOSIS — I10 Essential (primary) hypertension: Secondary | ICD-10-CM

## 2016-04-28 LAB — POCT GLYCOSYLATED HEMOGLOBIN (HGB A1C): Hemoglobin A1C: 6.7

## 2016-04-28 LAB — GLUCOSE, POCT (MANUAL RESULT ENTRY): POC Glucose: 124 mg/dl — AB (ref 70–99)

## 2016-04-28 MED ORDER — GABAPENTIN 300 MG PO CAPS: 300.0000 mg | ORAL_CAPSULE | Freq: Two times a day (BID) | ORAL | 3 refills | Status: DC

## 2016-04-28 MED FILL — GABAPENTIN 300 MG CAPSULE: 300 | 30 days supply | Qty: 60 | Fill #0

## 2016-04-28 MED FILL — AMLODIPINE BESYLATE 10 MG T: 10 | 30 days supply | Qty: 30 | Fill #1

## 2016-04-28 NOTE — Patient Instructions (Signed)

## 2016-04-28 NOTE — Progress Notes (Signed)
Patient is here for Clearance  Patient denies pain at this time.  Patient tolerated flu injection well today.

## 2016-04-28 NOTE — Progress Notes (Signed)
Subjective:  Patient ID: Paula [name], female    DOB: 01-11-1946  Age: 70 y.o. MRN: [id number]  CC: Medical Clearance   HPI Paula [name] is a 70 year old female with a history of type 2 diabetes mellitus (A1c 6.7), hypertension, stage III CKD (managed by [facility]) who presents today for evaluation of symptoms of palpitations. At a previous visit she had attributed palpitations to anxiety and stress from her daughter.  She had called the clinic requesting a referral to cardiology for this. She wakes up with palpitations in the middle of the night and this occurs intermittently but denies shortness of breath or chest pains. 2-D echo from 2013 reveals EF of 38-93%, grade 2 diastolic dysfunction. Denies night sweats, weight loss.  Also complains of numbness in feet which is occasional and prevents her from placing the sole of her foot on the floor.  Past Medical History:  Diagnosis Date  . Allergic rhinitis   . Arthritis   . Cough   . Diabetes mellitus   . Diastolic CHF, chronic ([place]) 10/11/2011  . GERD (gastroesophageal reflux disease)   . History of colon polyps 2012   tubular adenoma   . Hyperlipidemia   . Hypertension   . Neuropathy ([place]) 10/11/2011  . PONV (postoperative nausea and vomiting)    trouble waking up    Past Surgical History:  Procedure Laterality Date  . CATARACT EXTRACTION     right eye  . lymphatic mass surgery    . NASAL TURBINATE REDUCTION      Allergies  Allergen Reactions  . Dust Mite Extract Cough  . Ace Inhibitors Cough    chronic cough  . Lovastatin Other (See Comments)    Generalized body pain     Outpatient Medications Prior to Visit  Medication Sig Dispense Refill  . acetaminophen (TYLENOL) 500 MG tablet Take 500 mg by mouth every 6 (six) hours as needed for mild pain or headache.    . albuterol (PROVENTIL HFA;VENTOLIN HFA) 108 (90 Base) MCG/ACT inhaler Inhale 2 puffs into the lungs every 6 (six) hours as needed.  Shortness of breath 18 g 2  . amLODipine (NORVASC) 10 MG tablet Take 1 tablet (10 mg total) by mouth daily with breakfast. 90 tablet 1  . aspirin 81 MG tablet Take 81 mg by mouth daily.      . Blood Glucose Monitoring Suppl (TRUERESULT BLOOD GLUCOSE) w/Device KIT USE AS DIRECTED TO CHECK BLOOD SUGAR. 1 each 0  . cholecalciferol (VITAMIN D) 1000 units tablet Take 1,000 Units by mouth daily.    . diclofenac sodium (VOLTAREN) 1 % GEL Apply 1 application topically 4 (four) times daily. 100 g 0  . glipiZIDE (GLUCOTROL XL) 5 MG 24 hr tablet Take 1 tablet (5 mg total) by mouth daily with breakfast. For diabetes 90 tablet 1  . glucose blood test strip Use as instructed 100 each 12  . hydrocortisone cream 1 % Apply 1 application topically 2 (two) times daily. (Patient not taking: Reported on 03/25/2016) 30 g 0  . Lancets (FREESTYLE) lancets Use as instructed 100 each 12  . mometasone (NASONEX) 50 MCG/ACT nasal spray Place 2 sprays into the nose daily. 15 g 2  . pantoprazole (PROTONIX) 20 MG tablet Take 1 tablet (20 mg total) by mouth every morning. (Patient taking differently: Take 20 mg by mouth daily as needed for heartburn or indigestion. ) 90 tablet 2  . Polyethyl Glycol-Propyl Glycol (SYSTANE) 0.4-0.3 % SOLN Place 1 drop into both  eyes 2 (two) times daily.    . rosuvastatin (CRESTOR) 5 MG tablet Take 1 tablet (5 mg total) by mouth daily. 90 tablet 1  . traMADol (ULTRAM) 50 MG tablet Take 1 tablet (50 mg total) by mouth every 12 (twelve) hours as needed for moderate pain. 60 tablet 1  . TRUETEST TEST test strip USE TO CHECK BLOOD AS DIRECTED BY PHYSICIAN 100 each 12  . verapamil (CALAN) 80 MG tablet Take 1 tablet (80 mg total) by mouth 2 (two) times daily. 180 tablet 1   No facility-administered medications prior to visit.     ROS Review of Systems  Constitutional: Negative for activity change, appetite change and fatigue.  HENT: Negative for congestion, sinus pressure and sore throat.   Eyes:  Negative for visual disturbance.  Respiratory: Negative for cough, chest tightness, shortness of breath and wheezing.   Cardiovascular: Negative for chest pain and palpitations.  Gastrointestinal: Negative for abdominal distention, abdominal pain and constipation.  Endocrine: Negative for polydipsia.  Genitourinary: Negative for dysuria and frequency.  Musculoskeletal: Negative for arthralgias and back pain.  Skin: Negative for rash.  Neurological: Positive for numbness. Negative for tremors and light-headedness.  Hematological: Does not bruise/bleed easily.  Psychiatric/Behavioral: Negative for agitation and behavioral problems.    Objective: Vitals:   04/28/16 1717  BP: 129/65  Pulse: 72  Resp: 18  Temp: 98.7 F (37.1 C)  TempSrc: Oral  SpO2: 98%  Weight: 154 lb (69.9 kg)  Height: 5' 3"  (1.6 m)     LMP  (LMP Unknown)   BP/Weight 03/25/2016 03/11/2016 7/00/1749  Systolic BP 449 675 916  Diastolic BP 76 77 81  Wt. (Lbs) 150.2 151.4 149.8  BMI 26.61 26.82 26.54      Physical Exam  Constitutional: She is oriented to person, place, and time. She appears well-developed and well-nourished.  Cardiovascular: Normal rate, normal heart sounds and intact distal pulses.   No murmur heard. Pulmonary/Chest: Effort normal and breath sounds normal. She has no wheezes. She has no rales. She exhibits no tenderness.  Abdominal: Soft. Bowel sounds are normal. She exhibits no distension and no mass. There is no tenderness.  Musculoskeletal: Normal range of motion.  Neurological: She is alert and oriented to person, place, and time.    Lab Results  Component Value Date   HGBA1C 6.7 04/28/2016    Lipid Panel     Component Value Date/Time   CHOL 156 01/25/2016 0901   TRIG 98 01/25/2016 0901   HDL 54 01/25/2016 0901   CHOLHDL 2.9 01/25/2016 0901   VLDL 20 01/25/2016 0901   LDLCALC 82 01/25/2016 0901    Assessment & Plan:   1. Type 2 diabetes mellitus without complication,  without long-term current use of insulin (HCC) Controlled with A1c of 6.7 Continue current regimen - POCT A1C - Glucose (CBG)  2. Other diabetic neurological complication associated with type 2 diabetes mellitus (HCC) Commenced gabapentin - side effects discussed. - gabapentin (NEURONTIN) 300 MG capsule; Take 1 capsule (300 mg total) by mouth 2 (two) times daily.  Dispense: 60 capsule; Refill: 3  3. Essential hypertension Controlled  4. Palpitations Will need to exclude hypoglycemia as etiology; patient advised to check blood sugar whenever she has this episodes at night EKG performed today reveals first-degree AV block which could be secondary to verapamil use. We'll see back at next visit and refer to cardiology if workup is unrevealing. - TSH  5. Healthcare maintenance - Flu Vaccine QUAD 36+ mos PF IM (  Fluarix & Fluzone Quad PF)   Meds ordered this encounter  Medications  . gabapentin (NEURONTIN) 300 MG capsule    Sig: Take 1 capsule (300 mg total) by mouth 2 (two) times daily.    Dispense:  60 capsule    Refill:  3    Follow-up: Return in about 2 weeks (around 05/12/2016) for follow up on palpitations.   This note has been created with Surveyor, quantity. Any transcriptional errors are unintentional.    Arnoldo Morale MD

## 2016-04-29 LAB — TSH: TSH: 3.54 mIU/L

## 2016-04-30 ENCOUNTER — Telehealth: Payer: Self-pay

## 2016-04-30 NOTE — Telephone Encounter (Signed)
-----   Message from Arnoldo Morale, MD sent at 04/29/2016  4:47 PM EDT ----- Please inform the patient that labs are normal; advised to keep follow up appointment.Thank you.

## 2016-04-30 NOTE — Telephone Encounter (Signed)
Patient hipaa verif per guidelines. RN advised patient per Dr. Jarold Song: Please inform the patient that labs are normal; advised to keep follow up appointment.  Patient verbalized understanding and no further questions at this time.

## 2016-05-05 MED FILL — VERAPAMIL 80 MG TABLET: 80 | 30 days supply | Qty: 60 | Fill #1

## 2016-05-12 ENCOUNTER — Ambulatory Visit: Payer: Self-pay

## 2016-05-12 ENCOUNTER — Encounter: Payer: Self-pay | Admitting: Family Medicine

## 2016-05-12 ENCOUNTER — Ambulatory Visit: Payer: Self-pay | Attending: Family Medicine | Admitting: Family Medicine

## 2016-05-12 VITALS — BP 132/70 | HR 71 | Temp 97.6°F | Ht 63.0 in | Wt 153.4 lb

## 2016-05-12 DIAGNOSIS — E1141 Type 2 diabetes mellitus with diabetic mononeuropathy: Secondary | ICD-10-CM | POA: Insufficient documentation

## 2016-05-12 DIAGNOSIS — Z7984 Long term (current) use of oral hypoglycemic drugs: Secondary | ICD-10-CM | POA: Insufficient documentation

## 2016-05-12 DIAGNOSIS — E0841 Diabetes mellitus due to underlying condition with diabetic mononeuropathy: Secondary | ICD-10-CM

## 2016-05-12 DIAGNOSIS — N183 Chronic kidney disease, stage 3 (moderate): Secondary | ICD-10-CM | POA: Insufficient documentation

## 2016-05-12 DIAGNOSIS — M545 Low back pain, unspecified: Secondary | ICD-10-CM

## 2016-05-12 DIAGNOSIS — G8929 Other chronic pain: Secondary | ICD-10-CM

## 2016-05-12 DIAGNOSIS — Z79899 Other long term (current) drug therapy: Secondary | ICD-10-CM | POA: Insufficient documentation

## 2016-05-12 DIAGNOSIS — I129 Hypertensive chronic kidney disease with stage 1 through stage 4 chronic kidney disease, or unspecified chronic kidney disease: Secondary | ICD-10-CM | POA: Insufficient documentation

## 2016-05-12 DIAGNOSIS — Z7982 Long term (current) use of aspirin: Secondary | ICD-10-CM | POA: Insufficient documentation

## 2016-05-12 DIAGNOSIS — E1122 Type 2 diabetes mellitus with diabetic chronic kidney disease: Secondary | ICD-10-CM | POA: Insufficient documentation

## 2016-05-12 DIAGNOSIS — R002 Palpitations: Secondary | ICD-10-CM | POA: Insufficient documentation

## 2016-05-12 LAB — GLUCOSE, POCT (MANUAL RESULT ENTRY): POC Glucose: 157 mg/dl — AB (ref 70–99)

## 2016-05-12 MED ORDER — TRAMADOL HCL 50 MG PO TABS: 50.0000 mg | ORAL_TABLET | Freq: Two times a day (BID) | ORAL | 1 refills | Status: DC | PRN

## 2016-05-12 NOTE — Progress Notes (Signed)
Subjective:  Patient ID: Paula [name], female    DOB: Jul 03, 1946  Age: 70 y.o. MRN: [id number]  CC: Diabetes; Palpitations ("feeling better"); Hypertension; and Chronic Kidney Disease   HPI Paula [name] is a 70 year old female with a history of type 2 diabetes mellitus (A1c 6.7), hypertension, stage III CKD (managed by [facility]) who presents today for evaluation of symptoms of palpitations.  At her last office visit she had a TSH which came back normal, EKG revealed first-degree AV block. In the past she has endorsed being stressed by her daughter and also has had some degree of anxiety. I have reviewed her blood sugar log which reveals fasting sugars in the 101-165 range and her blood sugars between 2-3am are negative for hypoglycemia.  She would like to be evaluated by cardiology.  Outpatient Medications Prior to Visit  Medication Sig Dispense Refill  . acetaminophen (TYLENOL) 500 MG tablet Take 500 mg by mouth every 6 (six) hours as needed for mild pain or headache.    . albuterol (PROVENTIL HFA;VENTOLIN HFA) 108 (90 Base) MCG/ACT inhaler Inhale 2 puffs into the lungs every 6 (six) hours as needed. Shortness of breath 18 g 2  . amLODipine (NORVASC) 10 MG tablet Take 1 tablet (10 mg total) by mouth daily with breakfast. 90 tablet 1  . aspirin 81 MG tablet Take 81 mg by mouth daily.      . Blood Glucose Monitoring Suppl (TRUERESULT BLOOD GLUCOSE) w/Device KIT USE AS DIRECTED TO CHECK BLOOD SUGAR. 1 each 0  . cholecalciferol (VITAMIN D) 1000 units tablet Take 1,000 Units by mouth daily.    . diclofenac sodium (VOLTAREN) 1 % GEL Apply 1 application topically 4 (four) times daily. 100 g 0  . gabapentin (NEURONTIN) 300 MG capsule Take 1 capsule (300 mg total) by mouth 2 (two) times daily. 60 capsule 3  . glipiZIDE (GLUCOTROL XL) 5 MG 24 hr tablet Take 1 tablet (5 mg total) by mouth daily with breakfast. For diabetes 90 tablet 1  . glucose blood test strip Use as  instructed 100 each 12  . hydrocortisone cream 1 % Apply 1 application topically 2 (two) times daily. 30 g 0  . Lancets (FREESTYLE) lancets Use as instructed 100 each 12  . mometasone (NASONEX) 50 MCG/ACT nasal spray Place 2 sprays into the nose daily. 15 g 2  . pantoprazole (PROTONIX) 20 MG tablet Take 1 tablet (20 mg total) by mouth every morning. (Patient taking differently: Take 20 mg by mouth daily as needed for heartburn or indigestion. ) 90 tablet 2  . Polyethyl Glycol-Propyl Glycol (SYSTANE) 0.4-0.3 % SOLN Place 1 drop into both eyes 2 (two) times daily.    . rosuvastatin (CRESTOR) 5 MG tablet Take 1 tablet (5 mg total) by mouth daily. 90 tablet 1  . TRUETEST TEST test strip USE TO CHECK BLOOD AS DIRECTED BY PHYSICIAN 100 each 12  . verapamil (CALAN) 80 MG tablet Take 1 tablet (80 mg total) by mouth 2 (two) times daily. 180 tablet 1  . traMADol (ULTRAM) 50 MG tablet Take 1 tablet (50 mg total) by mouth every 12 (twelve) hours as needed for moderate pain. 60 tablet 1   No facility-administered medications prior to visit.     ROS Review of Systems  Constitutional: Negative for activity change and appetite change.  HENT: Negative for sinus pressure and sore throat.   Respiratory: Negative for chest tightness, shortness of breath and wheezing.   Cardiovascular: Positive for palpitations. Negative  for chest pain.  Gastrointestinal: Negative for abdominal distention, abdominal pain and constipation.  Genitourinary: Negative.   Musculoskeletal: Negative.   Psychiatric/Behavioral: Negative for behavioral problems and dysphoric mood.    Objective:  BP 132/70 (BP Location: Right Arm, Patient Position: Sitting, Cuff Size: Large)   Pulse 71   Temp 97.6 F (36.4 C) (Oral)   Ht _0  (1.6 m)   Wt 153 lb 6.4 oz (69.6 kg)   LMP  (LMP Unknown)   SpO2 98%   BMI 27.17 kg/m   BP/Weight 05/12/2016 04/28/2016 12/07/7679  Systolic BP 157 262 035  Diastolic BP 70 65 76  Wt. (Lbs) 153.4 154  150.2  BMI 27.17 27.28 26.61      Physical Exam  Constitutional: She is oriented to person, place, and time. She appears well-developed and well-nourished.  Cardiovascular: Normal rate, normal heart sounds and intact distal pulses.   No murmur heard. Pulmonary/Chest: Effort normal and breath sounds normal. She has no wheezes. She has no rales. She exhibits no tenderness.  Abdominal: Soft. Bowel sounds are normal. She exhibits no distension and no mass. There is no tenderness.  Musculoskeletal: Normal range of motion.  Neurological: She is alert and oriented to person, place, and time.    Lab Results  Component Value Date   TSH 3.54 04/28/2016    Lab Results  Component Value Date   HGBA1C 6.7 04/28/2016     Assessment & Plan:   1. Diabetic mononeuropathy associated with diabetes mellitus due to underlying condition ([place]) Diabetes is controlled with A1c of 6.7 No hypoglycemia We'll see back in 2 months to refill medications as she will be traveling to [name] - Glucose (CBG)  2. Chronic midline low back pain without sciatica Controlled on tramadol - traMADol (ULTRAM) 50 MG tablet; Take 1 tablet (50 mg total) by mouth every 12 (twelve) hours as needed for moderate pain.  Dispense: 60 tablet; Refill: 1  3. Palpitations Workup so far negative. First degree AV block on EKG could be secondary to Verapamil We'll refer to cardiology as per patient request. - Ambulatory referral to Cardiology   Meds ordered this encounter  Medications  . traMADol (ULTRAM) 50 MG tablet    Sig: Take 1 tablet (50 mg total) by mouth every 12 (twelve) hours as needed for moderate pain.    Dispense:  60 tablet    Refill:  1    Follow-up: Return in about 2 months (around 07/12/2016) for Follow-up on diabetes mellitus.   This note has been created with Surveyor, quantity. Any transcriptional errors are unintentional.    Arnoldo Morale MD

## 2016-05-12 NOTE — Patient Instructions (Signed)

## 2016-05-13 MED FILL — traMADol HCL 50 MG TABS: 50 | 30 days supply | Qty: 60 | Fill #0

## 2016-05-14 ENCOUNTER — Encounter: Payer: Self-pay | Admitting: Cardiology

## 2016-05-15 ENCOUNTER — Encounter: Payer: Self-pay | Admitting: Cardiology

## 2016-05-15 ENCOUNTER — Ambulatory Visit (INDEPENDENT_AMBULATORY_CARE_PROVIDER_SITE_OTHER): Payer: Self-pay | Admitting: Cardiology

## 2016-05-15 VITALS — BP 152/70 | HR 80 | Ht 63.0 in | Wt 153.4 lb

## 2016-05-15 DIAGNOSIS — R002 Palpitations: Secondary | ICD-10-CM

## 2016-05-15 NOTE — Progress Notes (Signed)
05/15/2016 Paula [name]   [birth year]  [id number]  Primary Physician Paula Morale, MD Primary Cardiologist: New (Dr. Harrington [name], Paula [name])   Reason for Visit/CC: New Patient Referral for Palpitations   HPI:  Paula [name] is a 70 y/o AAF, referred to our practice, as a new patient by her PCP, Paula [name], for evaluation for palpitations. She has no prior h/o any cardiac issue. Risk factors include HTN HLD, and T2DM, all of which are treated with medication. Last Hgb A1c was at goal at 6.7. She also has a h/o stage III CKD, managed by [facility]. Her chart has been reviewed. It appears she had a NST in 2009 that was negative for ischemia. She also had a 2D echo in 2013 which showed normal LVEF at 60-65%. Wall motion was normal. There were no regional wall motion abnormalities. Grade 2DD was noted.   She was seen at her PCP office on 05/12/16 and complained of palpitations. At her last office visit she had a TSH which came back normal, EKG revealed first-degree AV block (on verapamil for BP).  She reports symptoms have been occurring off and on for the past 2 1/2 months. Occurs almost every night while sleeping. She notes heavy pounding in her chest. Rate dose not feel fast, just hard. It dose not hurt when it occurs. No associated dyspnea, diaphoresis, dizziness, syncope/ near syncope. Episodes can last up to an hour. She denies caffeine, ETOH and tobacco use.   She denies any exertional symptoms. She lives in a 2 story home. No exertional CP or dyspnea climbing a flight of stairs. No symptoms or limitations with house hold chores.    Current Meds  Medication Sig  . acetaminophen (TYLENOL) 500 MG tablet Take 500 mg by mouth every 6 (six) hours as needed for mild pain or headache.  . albuterol (PROVENTIL HFA;VENTOLIN HFA) 108 (90 Base) MCG/ACT inhaler Inhale 2 puffs into the lungs every 6 (six) hours as needed. Shortness of breath  . amLODipine (NORVASC) 10 MG tablet Take 1 tablet (10 mg  total) by mouth daily with breakfast.  . aspirin 81 MG tablet Take 81 mg by mouth daily.    . Blood Glucose Monitoring Suppl (TRUERESULT BLOOD GLUCOSE) w/Device KIT USE AS DIRECTED TO CHECK BLOOD SUGAR.  . cholecalciferol (VITAMIN D) 1000 units tablet Take 1,000 Units by mouth daily.  . diclofenac sodium (VOLTAREN) 1 % GEL Apply 1 application topically 4 (four) times daily.  Marland Kitchen gabapentin (NEURONTIN) 300 MG capsule Take 1 capsule (300 mg total) by mouth 2 (two) times daily.  Marland Kitchen glipiZIDE (GLUCOTROL XL) 5 MG 24 hr tablet Take 1 tablet (5 mg total) by mouth daily with breakfast. For diabetes  . glucose blood test strip Use as instructed  . hydrocortisone cream 1 % Apply 1 application topically 2 (two) times daily.  . Lancets (FREESTYLE) lancets Use as instructed  . mometasone (NASONEX) 50 MCG/ACT nasal spray Place 2 sprays into the nose daily.  . pantoprazole (PROTONIX) 20 MG tablet Take 1 tablet (20 mg total) by mouth every morning. (Patient taking differently: Take 20 mg by mouth daily as needed for heartburn or indigestion. )  . Polyethyl Glycol-Propyl Glycol (SYSTANE) 0.4-0.3 % SOLN Place 1 drop into both eyes 2 (two) times daily.  . rosuvastatin (CRESTOR) 5 MG tablet Take 1 tablet (5 mg total) by mouth daily.  . traMADol (ULTRAM) 50 MG tablet Take 1 tablet (50 mg total) by mouth every 12 (twelve) hours as needed for moderate  pain.  Marland Kitchen TRUETEST TEST test strip USE TO CHECK BLOOD AS DIRECTED BY PHYSICIAN  . verapamil (CALAN) 80 MG tablet Take 1 tablet (80 mg total) by mouth 2 (two) times daily.   Allergies  Allergen Reactions  . Dust Mite Extract Cough  . Ace Inhibitors Cough    chronic cough  . Lovastatin Other (See Comments)    Generalized body pain   Past Medical History:  Diagnosis Date  . Allergic rhinitis   . Arthritis   . Cough   . Diabetes mellitus   . Diastolic CHF, chronic ([place]) 10/11/2011  . GERD (gastroesophageal reflux disease)   . History of colon polyps 2012   tubular  adenoma   . Hyperlipidemia   . Hypertension   . Neuropathy ([place]) 10/11/2011  . PONV (postoperative nausea and vomiting)    trouble waking up   Family History  Problem Relation Age of Onset  . Hypertension Mother   . Asthma Sister   . Colon cancer Neg Hx    Past Surgical History:  Procedure Laterality Date  . CATARACT EXTRACTION     right eye  . lymphatic mass surgery    . NASAL TURBINATE REDUCTION     Social History   Social History  . Marital status: Widowed    Spouse name: N/A  . Number of children: 4  . Years of education: N/A   Occupational History  . Unemployed    Social History Main Topics  . Smoking status: Never Smoker  . Smokeless tobacco: Never Used  . Alcohol use No  . Drug use: No  . Sexual activity: Not Currently    Birth control/ protection: None   Other Topics Concern  . Not on file   Social History Narrative  . No narrative on file     Review of Systems: General: negative for chills, fever, night sweats or weight changes.  Cardiovascular: negative for chest pain, dyspnea on exertion, edema, orthopnea, palpitations, paroxysmal nocturnal dyspnea or shortness of breath Dermatological: negative for rash Respiratory: negative for cough or wheezing Urologic: negative for hematuria Abdominal: negative for nausea, vomiting, diarrhea, bright red blood per rectum, melena, or hematemesis Neurologic: negative for visual changes, syncope, or dizziness All other systems reviewed and are otherwise negative except as noted above.   Physical Exam:  Blood pressure (!) 152/70, pulse 80, height 5' 3"  (1.6 m), weight 153 lb 6.4 oz (69.6 kg).  General appearance: alert, cooperative and no distress Neck: no carotid bruit and no JVD Lungs: clear to auscultation bilaterally Heart: regular rate and rhythm, S1, S2 normal, no murmur, click, rub or gallop Extremities: no LEE Pulses: 2+ and symmetric Skin: warm and dry Neurologic: Grossly normal  EKG NSR. 1st  degree AVB. No ischemia.  ASSESSMENT AND PLAN:   1. Palpiations: recent TSH was WNL. EKG shows NSR with 1st degree AVB (on verapamil for BP). She had a NST in 2009 that was negative for ischemia and a 2D echo in 2013 that showed normal LVEF, trivial AI and G2DD. Her physical exam today is benign. RRR. No murmurs. I've discussed patient case with Dr. Harrington [name], DOD. We will further evaluate patient with a 30 day monitor to assess for worrisome cardiac arrhthymias. No indication for NST nor repeat 2D echo, at this time, as patient has not had any exertional CP nor dyspnea. F/u in 4-5 weeks after monitor.    Lyda Jester PA-C 05/15/2016 11:54 AM

## 2016-05-15 NOTE — Patient Instructions (Signed)
Medication Instructions:   Your physician recommends that you continue on your current medications as directed. Please refer to the Current Medication list given to you today.    If you need a refill on your cardiac medications before your next appointment, please call your pharmacy.  Labwork: NONE ORDER TODAY    Testing/Procedures: Your physician has recommended that you wear an event monitor. Event monitors are medical devices that record the heart's electrical activity. Doctors most often [place] these monitors to diagnose arrhythmias. Arrhythmias are problems with the speed or rhythm of the heartbeat. The monitor is a small, portable device. You can wear one while you do your normal daily activities. This is usually used to diagnose what is causing palpitations/syncope (passing out).    Follow-Up:  AFTER RESULTS SOME ONE WILL CONTACT YOUR FOR AN FOLLOW UP    Any Other Special Instructions Will Be Listed Below (If Applicable).

## 2016-05-22 MED FILL — ROSUVASTATIN CAL 5 MG TAB: 5 | 30 days supply | Qty: 30 | Fill #2 | Status: TO

## 2016-05-26 MED FILL — ?AMLODIPINE BESYLATE 10 MG: 10 | 30 days supply | Qty: 30 | Fill #2

## 2016-05-26 MED FILL — glipiZIDE XL 2.5 MG TB24: 2.5 | 30 days supply | Qty: 30 | Fill #0

## 2016-06-03 ENCOUNTER — Encounter (INDEPENDENT_AMBULATORY_CARE_PROVIDER_SITE_OTHER): Payer: Self-pay

## 2016-06-03 DIAGNOSIS — R002 Palpitations: Secondary | ICD-10-CM

## 2016-06-04 MED FILL — VERAPAMIL 80 MG TABLET: 80 | 30 days supply | Qty: 60 | Fill #2

## 2016-06-12 MED FILL — traMADol HCL 50 MG TABS: 50 | 30 days supply | Qty: 60 | Fill #1

## 2016-06-17 MED FILL — ROSUVASTATIN CAL 5 MG TAB: 5 | 30 days supply | Qty: 30 | Fill #3 | Status: TO

## 2016-06-20 MED FILL — TRUE METRIX TEST STRIP: 25 days supply | Qty: 100 | Fill #3

## 2016-06-24 ENCOUNTER — Other Ambulatory Visit: Payer: Self-pay | Admitting: Internal Medicine

## 2016-06-24 DIAGNOSIS — E119 Type 2 diabetes mellitus without complications: Secondary | ICD-10-CM

## 2016-06-24 MED FILL — AMLODIPINE BESYLATE 10 MG T: 10 | 30 days supply | Qty: 30 | Fill #3

## 2016-06-24 MED FILL — glipiZIDE XL 5 MG TB24: 5 | 30 days supply | Qty: 30 | Fill #0

## 2016-06-27 ENCOUNTER — Ambulatory Visit: Payer: Self-pay | Attending: Family Medicine | Admitting: Family Medicine

## 2016-06-27 ENCOUNTER — Encounter: Payer: Self-pay | Admitting: Family Medicine

## 2016-06-27 VITALS — BP 130/70 | HR 69 | Temp 97.6°F | Ht 63.0 in | Wt 155.0 lb

## 2016-06-27 DIAGNOSIS — K219 Gastro-esophageal reflux disease without esophagitis: Secondary | ICD-10-CM | POA: Insufficient documentation

## 2016-06-27 DIAGNOSIS — E1149 Type 2 diabetes mellitus with other diabetic neurological complication: Secondary | ICD-10-CM

## 2016-06-27 DIAGNOSIS — I129 Hypertensive chronic kidney disease with stage 1 through stage 4 chronic kidney disease, or unspecified chronic kidney disease: Secondary | ICD-10-CM | POA: Insufficient documentation

## 2016-06-27 DIAGNOSIS — Z79899 Other long term (current) drug therapy: Secondary | ICD-10-CM | POA: Insufficient documentation

## 2016-06-27 DIAGNOSIS — N183 Chronic kidney disease, stage 3 (moderate): Secondary | ICD-10-CM | POA: Insufficient documentation

## 2016-06-27 DIAGNOSIS — Z7982 Long term (current) use of aspirin: Secondary | ICD-10-CM | POA: Insufficient documentation

## 2016-06-27 DIAGNOSIS — E7800 Pure hypercholesterolemia, unspecified: Secondary | ICD-10-CM | POA: Insufficient documentation

## 2016-06-27 DIAGNOSIS — Z889 Allergy status to unspecified drugs, medicaments and biological substances status: Secondary | ICD-10-CM

## 2016-06-27 DIAGNOSIS — E1140 Type 2 diabetes mellitus with diabetic neuropathy, unspecified: Secondary | ICD-10-CM | POA: Insufficient documentation

## 2016-06-27 DIAGNOSIS — I10 Essential (primary) hypertension: Secondary | ICD-10-CM

## 2016-06-27 DIAGNOSIS — Z7984 Long term (current) use of oral hypoglycemic drugs: Secondary | ICD-10-CM | POA: Insufficient documentation

## 2016-06-27 DIAGNOSIS — G8929 Other chronic pain: Secondary | ICD-10-CM

## 2016-06-27 DIAGNOSIS — M545 Low back pain, unspecified: Secondary | ICD-10-CM

## 2016-06-27 DIAGNOSIS — Z760 Encounter for issue of repeat prescription: Secondary | ICD-10-CM | POA: Insufficient documentation

## 2016-06-27 DIAGNOSIS — E1122 Type 2 diabetes mellitus with diabetic chronic kidney disease: Secondary | ICD-10-CM | POA: Insufficient documentation

## 2016-06-27 LAB — COMPLETE METABOLIC PANEL WITH GFR
ALT: 27 U/L (ref 6–29)
AST: 22 U/L (ref 10–35)
Albumin: 4.1 g/dL (ref 3.6–5.1)
Alkaline Phosphatase: 87 U/L (ref 33–130)
BUN: 18 mg/dL (ref 7–25)
CO2: 27 mmol/L (ref 20–31)
Calcium: 9.6 mg/dL (ref 8.6–10.4)
Chloride: 101 mmol/L (ref 98–110)
Creat: 1.52 mg/dL — ABNORMAL HIGH (ref 0.50–0.99)
GFR, Est African American: 40 mL/min — ABNORMAL LOW (ref >=60)
GFR, Est Non African American: 35 mL/min — ABNORMAL LOW (ref >=60)
Glucose, Bld: 242 mg/dL — ABNORMAL HIGH (ref 65–99)
Potassium: 3.9 mmol/L (ref 3.5–5.3)
Sodium: 137 mmol/L (ref 135–146)
Total Bilirubin: 0.4 mg/dL (ref 0.2–1.2)
Total Protein: 7.2 g/dL (ref 6.1–8.1)

## 2016-06-27 LAB — GLUCOSE, POCT (MANUAL RESULT ENTRY): POC Glucose: 275 mg/dl — AB (ref 70–99)

## 2016-06-27 MED ORDER — ALBUTEROL SULFATE HFA 108 (90 BASE) MCG/ACT IN AERS: 2.0000 | INHALATION_SPRAY | Freq: Four times a day (QID) | RESPIRATORY_TRACT | 2 refills | Status: DC | PRN

## 2016-06-27 MED ORDER — PANTOPRAZOLE SODIUM 20 MG PO TBEC: 20.0000 mg | DELAYED_RELEASE_TABLET | Freq: Every day | ORAL | 1 refills | Status: DC | PRN

## 2016-06-27 MED ORDER — GABAPENTIN 300 MG PO CAPS: 300.0000 mg | ORAL_CAPSULE | Freq: Two times a day (BID) | ORAL | 1 refills | Status: DC

## 2016-06-27 MED ORDER — AMLODIPINE BESYLATE 10 MG PO TABS: 10.0000 mg | ORAL_TABLET | Freq: Every day | ORAL | 1 refills | Status: DC

## 2016-06-27 MED ORDER — VERAPAMIL HCL 80 MG PO TABS: 80.0000 mg | ORAL_TABLET | Freq: Two times a day (BID) | ORAL | 1 refills | Status: DC

## 2016-06-27 MED ORDER — GLUCOSE BLOOD VI STRP
ORAL_STRIP | 1 refills | Status: DC
Start: 2016-06-27 — End: 2017-07-28

## 2016-06-27 MED ORDER — GLIPIZIDE ER 5 MG PO TB24: 5.0000 mg | ORAL_TABLET | Freq: Every day | ORAL | 0 refills | Status: DC

## 2016-06-27 MED ORDER — CETIRIZINE HCL 10 MG PO TABS: 10.0000 mg | ORAL_TABLET | Freq: Every day | ORAL | 1 refills | Status: DC

## 2016-06-27 MED ORDER — TRAMADOL HCL 50 MG PO TABS: 50.0000 mg | ORAL_TABLET | Freq: Two times a day (BID) | ORAL | 1 refills | Status: DC | PRN

## 2016-06-27 MED ORDER — ROSUVASTATIN CALCIUM 5 MG PO TABS: 5.0000 mg | ORAL_TABLET | Freq: Every day | ORAL | 1 refills | Status: DC

## 2016-06-27 MED FILL — PANTOPRAZOLE SOD DR 20 MG T: 20 | 90 days supply | Qty: 90 | Fill #0

## 2016-06-27 MED FILL — ROSUVASTATIN CAL 5 MG TAB: 5 | 90 days supply | Qty: 90 | Fill #0

## 2016-06-27 MED FILL — VERAPAMIL 80 MG TABLET: 80 | 90 days supply | Qty: 180 | Fill #0

## 2016-06-27 MED FILL — AMLODIPINE BESYLATE 10 MG T: 10 | 90 days supply | Qty: 90 | Fill #0

## 2016-06-27 MED FILL — glipiZIDE XL 5 MG TB24: 5 | 90 days supply | Qty: 90 | Fill #0

## 2016-06-27 MED FILL — GABAPENTIN 300 MG CAPSULE: 300 | 90 days supply | Qty: 180 | Fill #0

## 2016-06-27 MED FILL — VENTOLIN HFA 90 MCG INHALER: 108 (90 BAS | 89 days supply | Qty: 54 | Fill #0

## 2016-06-27 MED FILL — TRUE METRIX TEST STRIP: 50 days supply | Qty: 200 | Fill #4

## 2016-06-27 NOTE — Patient Instructions (Signed)
Diabetes Mellitus and Food It is important for you to manage your blood sugar (glucose) level. Your blood glucose level can be greatly affected by what you eat. Eating healthier foods in the appropriate amounts throughout the day at about the same time each day will help you control your blood glucose level. It can also help slow or prevent worsening of your diabetes mellitus. Healthy eating may even help you improve the level of your blood pressure and reach or maintain a healthy weight. General recommendations for healthful eating and cooking habits include:  Eating meals and snacks regularly. Avoid going long periods of time without eating to lose weight.  Eating a diet that consists mainly of plant-based foods, such as fruits, vegetables, nuts, legumes, and whole grains.  Using low-heat cooking methods, such as baking, instead of high-heat cooking methods, such as deep frying.  Work with your dietitian to make sure you understand how to use the Nutrition Facts information on food labels. How can food affect me? Carbohydrates Carbohydrates affect your blood glucose level more than any other type of food. Your dietitian will help you determine how many carbohydrates to eat at each meal and teach you how to count carbohydrates. Counting carbohydrates is important to keep your blood glucose at a healthy level, especially if you are using insulin or taking certain medicines for diabetes mellitus. Alcohol Alcohol can cause sudden decreases in blood glucose (hypoglycemia), especially if you use insulin or take certain medicines for diabetes mellitus. Hypoglycemia can be a life-threatening condition. Symptoms of hypoglycemia (sleepiness, dizziness, and disorientation) are similar to symptoms of having too much alcohol. If your health care provider has given you approval to drink alcohol, do so in moderation and use the following guidelines:  Women should not have more than one drink per day, and men  should not have more than two drinks per day. One drink is equal to: ? 12 oz of beer. ? 5 oz of wine. ? 1 oz of hard liquor.  Do not drink on an empty stomach.  Keep yourself hydrated. Have water, diet soda, or unsweetened iced tea.  Regular soda, juice, and other mixers might contain a lot of carbohydrates and should be counted.  What foods are not recommended? As you make food choices, it is important to remember that all foods are not the same. Some foods have fewer nutrients per serving than other foods, even though they might have the same number of calories or carbohydrates. It is difficult to get your body what it needs when you eat foods with fewer nutrients. Examples of foods that you should avoid that are high in calories and carbohydrates but low in nutrients include:  Trans fats (most processed foods list trans fats on the Nutrition Facts label).  Regular soda.  Juice.  Candy.  Sweets, such as cake, pie, doughnuts, and cookies.  Fried foods.  What foods can I eat? Eat nutrient-rich foods, which will nourish your body and keep you healthy. The food you should eat also will depend on several factors, including:  The calories you need.  The medicines you take.  Your weight.  Your blood glucose level.  Your blood pressure level.  Your cholesterol level.  You should eat a variety of foods, including:  Protein. ? Lean cuts of meat. ? Proteins low in saturated fats, such as fish, egg whites, and beans. Avoid processed meats.  Fruits and vegetables. ? Fruits and vegetables that may help control blood glucose levels, such as apples,   mangoes, and yams.  Dairy products. ? Choose fat-free or low-fat dairy products, such as milk, yogurt, and cheese.  Grains, bread, pasta, and rice. ? Choose whole grain products, such as multigrain bread, whole oats, and brown rice. These foods may help control blood pressure.  Fats. ? Foods containing healthful fats, such as  nuts, avocado, olive oil, canola oil, and fish.  Does everyone with diabetes mellitus have the same meal plan? Because every person with diabetes mellitus is different, there is not one meal plan that works for everyone. It is very important that you meet with a dietitian who will help you create a meal plan that is just right for you. This information is not intended to replace advice given to you by your health care provider. Make sure you discuss any questions you have with your health care provider. Document Released: 04/24/2005 Document Revised: 01/03/2016 Document Reviewed: 06/24/2013 Elsevier Interactive Patient Education  2017 Elsevier Inc.  

## 2016-06-27 NOTE — Progress Notes (Signed)
Subjective:  Patient ID: Paula [name], female    DOB: 16-Mar-1946  Age: 70 y.o. MRN: [id number]  CC: Diabetes and medication refills (leaving for [place] at the end of the month)   HPI Paula [name] is a 70 year old female with a history of type 2 diabetes mellitus (A1c 6.7), hypertension, hypercholesterolemia, allergic rhinitis stage III CKD (managed by [facility]) who comes into the clinic for a follow-up visit.  She has been compliant with her glipizide and denies hypoglycemia. Has also been compliant with a diabetic diet and exercise. She does have diabetic neuropathy which is controlled on gabapentin.  Tolerating her antihypertensive with no complains of chest pains. She did have palpitations for which she had been referred to cardiology and is currently wearing an event monitor.  2 weeks ago she developed sinus congestion, rhinorrhea, worsening postnasal drip but she states the symptoms are improving with the use of OTC antihistamines. She does have a history of allergic rhinitis has been unable to obtain her Nasonex from the pharmacy due to cost.  Past Medical History:  Diagnosis Date  . Allergic rhinitis   . Arthritis   . Cough   . Diabetes mellitus   . Diastolic CHF, chronic ([place]) 10/11/2011  . GERD (gastroesophageal reflux disease)   . History of colon polyps 2012   tubular adenoma   . Hyperlipidemia   . Hypertension   . Neuropathy ([place]) 10/11/2011  . PONV (postoperative nausea and vomiting)    trouble waking up    Past Surgical History:  Procedure Laterality Date  . CATARACT EXTRACTION     right eye  . lymphatic mass surgery    . NASAL TURBINATE REDUCTION       Outpatient Medications Prior to Visit  Medication Sig Dispense Refill  . acetaminophen (TYLENOL) 500 MG tablet Take 500 mg by mouth every 6 (six) hours as needed for mild pain or headache.    Marland Kitchen aspirin 81 MG tablet Take 81 mg by mouth daily.      . Blood Glucose Monitoring Suppl  (TRUERESULT BLOOD GLUCOSE) w/Device KIT USE AS DIRECTED TO CHECK BLOOD SUGAR. 1 each 0  . cholecalciferol (VITAMIN D) 1000 units tablet Take 1,000 Units by mouth daily.    Marland Kitchen glucose blood test strip Use as instructed 100 each 12  . Lancets (FREESTYLE) lancets Use as instructed 100 each 12  . Polyethyl Glycol-Propyl Glycol (SYSTANE) 0.4-0.3 % SOLN Place 1 drop into both eyes 2 (two) times daily.    Marland Kitchen albuterol (PROVENTIL HFA;VENTOLIN HFA) 108 (90 Base) MCG/ACT inhaler Inhale 2 puffs into the lungs every 6 (six) hours as needed. Shortness of breath 18 g 2  . amLODipine (NORVASC) 10 MG tablet Take 1 tablet (10 mg total) by mouth daily with breakfast. 90 tablet 1  . gabapentin (NEURONTIN) 300 MG capsule Take 1 capsule (300 mg total) by mouth 2 (two) times daily. 60 capsule 3  . glipiZIDE (GLUCOTROL XL) 5 MG 24 hr tablet Take 1 tablet (5 mg total) by mouth daily with breakfast. 90 tablet 0  . pantoprazole (PROTONIX) 20 MG tablet Take 1 tablet (20 mg total) by mouth every morning. (Patient taking differently: Take 20 mg by mouth daily as needed for heartburn or indigestion. ) 90 tablet 2  . rosuvastatin (CRESTOR) 5 MG tablet Take 1 tablet (5 mg total) by mouth daily. 90 tablet 1  . traMADol (ULTRAM) 50 MG tablet Take 1 tablet (50 mg total) by mouth every 12 (twelve) hours as  needed for moderate pain. 60 tablet 1  . TRUETEST TEST test strip USE TO CHECK BLOOD AS DIRECTED BY PHYSICIAN 100 each 12  . verapamil (CALAN) 80 MG tablet Take 1 tablet (80 mg total) by mouth 2 (two) times daily. 180 tablet 1  . diclofenac sodium (VOLTAREN) 1 % GEL Apply 1 application topically 4 (four) times daily. (Patient not taking: Reported on 06/27/2016) 100 g 0  . hydrocortisone cream 1 % Apply 1 application topically 2 (two) times daily. (Patient not taking: Reported on 06/27/2016) 30 g 0  . mometasone (NASONEX) 50 MCG/ACT nasal spray Place 2 sprays into the nose daily. (Patient not taking: Reported on 06/27/2016) 15 g 2    No facility-administered medications prior to visit.     ROS Review of Systems  Constitutional: Negative for activity change, appetite change and fatigue.  HENT: Positive for postnasal drip. Negative for congestion, sinus pressure and sore throat.   Eyes: Negative for visual disturbance.  Respiratory: Negative for cough, chest tightness, shortness of breath and wheezing.   Cardiovascular: Negative for chest pain and palpitations.  Gastrointestinal: Negative for abdominal distention, abdominal pain and constipation.  Endocrine: Negative for polydipsia.  Genitourinary: Negative for dysuria and frequency.  Musculoskeletal: Negative for arthralgias and back pain.  Skin: Negative for rash.  Neurological: Negative for tremors, light-headedness and numbness.  Hematological: Does not bruise/bleed easily.  Psychiatric/Behavioral: Negative for agitation and behavioral problems.    Objective:  BP 130/70 (BP Location: Right Arm, Patient Position: Sitting, Cuff Size: Small)   Pulse 69   Temp 97.6 F (36.4 C) (Oral)   Ht _0  (1.6 m)   Wt 155 lb (70.3 kg)   LMP  (LMP Unknown)   SpO2 99%   BMI 27.46 kg/m   BP/Weight 06/27/2016 05/15/2016 14/11/8183  Systolic BP 631 497 026  Diastolic BP 70 70 70  Wt. (Lbs) 155 153.4 153.4  BMI 27.46 27.17 27.17      Physical Exam  Constitutional: She is oriented to person, place, and time. She appears well-developed and well-nourished.  Cardiovascular: Normal rate, normal heart sounds and intact distal pulses.   No murmur heard. Pulmonary/Chest: Effort normal and breath sounds normal. She has no wheezes. She has no rales. She exhibits no tenderness.  Wearing an event monitor  Abdominal: Soft. Bowel sounds are normal. She exhibits no distension and no mass. There is no tenderness.  Musculoskeletal: Normal range of motion.  Neurological: She is alert and oriented to person, place, and time.    Lab Results  Component Value Date   HGBA1C 6.7  04/28/2016    CMP Latest Ref Rng & Units 01/25/2016 06/05/2015 06/05/2015  Glucose 65 - 99 mg/dL 146(H) - 95  BUN 7 - 25 mg/dL 21 - 23  Creatinine 0.50 - 0.99 mg/dL 1.53(H) - 1.32(H)  Sodium 135 - 146 mmol/L 141 - 138  Potassium 3.5 - 5.3 mmol/L 4.0 - 3.7  Chloride 98 - 110 mmol/L 103 - 100  CO2 20 - 31 mmol/L 28 - 25  Calcium 8.6 - 10.4 mg/dL 9.7 9.2 9.2  Total Protein 6.1 - 8.1 g/dL 7.6 - 7.5  Total Bilirubin 0.2 - 1.2 mg/dL 0.4 - 0.3  Alkaline Phos 33 - 130 U/L 80 - 88  AST 10 - 35 U/L 18 - 21  ALT 6 - 29 U/L 18 - 23    Lipid Panel     Component Value Date/Time   CHOL 156 01/25/2016 0901   TRIG 98 01/25/2016 0901  HDL 54 01/25/2016 0901   CHOLHDL 2.9 01/25/2016 0901   VLDL 20 01/25/2016 0901   LDLCALC 82 01/25/2016 0901    Assessment & Plan:   1. H/O seasonal allergies Placed on Zyrtec as she has not been able to obtain Nasonex due to cost - albuterol (PROVENTIL HFA;VENTOLIN HFA) 108 (90 Base) MCG/ACT inhaler; Inhale 2 puffs into the lungs every 6 (six) hours as needed. Shortness of breath  Dispense: 18 g; Refill: 2  2. Essential hypertension Controlled - amLODipine (NORVASC) 10 MG tablet; Take 1 tablet (10 mg total) by mouth daily with breakfast.  Dispense: 90 tablet; Refill: 1 - verapamil (CALAN) 80 MG tablet; Take 1 tablet (80 mg total) by mouth 2 (two) times daily.  Dispense: 180 tablet; Refill: 1  3. Other diabetic neurological complication associated with type 2 diabetes mellitus ([place]) Controlled with A1c of 6.7 - Glucose (CBG) - gabapentin (NEURONTIN) 300 MG capsule; Take 1 capsule (300 mg total) by mouth 2 (two) times daily.  Dispense: 180 capsule; Refill: 1 - glipiZIDE (GLUCOTROL XL) 5 MG 24 hr tablet; Take 1 tablet (5 mg total) by mouth daily with breakfast.  Dispense: 90 tablet; Refill: 0 - COMPLETE METABOLIC PANEL WITH GFR  4. Pure hypercholesterolemia Controlled - rosuvastatin (CRESTOR) 5 MG tablet; Take 1 tablet (5 mg total) by mouth daily.   Dispense: 90 tablet; Refill: 1  5. Gastroesophageal reflux disease without esophagitis Stable - pantoprazole (PROTONIX) 20 MG tablet; Take 1 tablet (20 mg total) by mouth daily as needed for heartburn or indigestion.  Dispense: 90 tablet; Refill: 1  6. Chronic midline low back pain without sciatica Controlled on tramadol - traMADol (ULTRAM) 50 MG tablet; Take 1 tablet (50 mg total) by mouth every 12 (twelve) hours as needed for moderate pain.  Dispense: 90 tablet; Refill: 1   Meds ordered this encounter  Medications  . DISCONTD: albuterol (PROVENTIL HFA;VENTOLIN HFA) 108 (90 Base) MCG/ACT inhaler    Sig: Inhale 2 puffs into the lungs every 6 (six) hours as needed. Shortness of breath    Dispense:  18 g    Refill:  2  . albuterol (PROVENTIL HFA;VENTOLIN HFA) 108 (90 Base) MCG/ACT inhaler    Sig: Inhale 2 puffs into the lungs every 6 (six) hours as needed. Shortness of breath    Dispense:  18 g    Refill:  2  . amLODipine (NORVASC) 10 MG tablet    Sig: Take 1 tablet (10 mg total) by mouth daily with breakfast.    Dispense:  90 tablet    Refill:  1  . gabapentin (NEURONTIN) 300 MG capsule    Sig: Take 1 capsule (300 mg total) by mouth 2 (two) times daily.    Dispense:  180 capsule    Refill:  1  . glipiZIDE (GLUCOTROL XL) 5 MG 24 hr tablet    Sig: Take 1 tablet (5 mg total) by mouth daily with breakfast.    Dispense:  90 tablet    Refill:  0  . rosuvastatin (CRESTOR) 5 MG tablet    Sig: Take 1 tablet (5 mg total) by mouth daily.    Dispense:  90 tablet    Refill:  1  . glucose blood (TRUETEST TEST) test strip    Sig: USE TO CHECK BLOOD AS DIRECTED ONCE DAILY    Dispense:  100 each    Refill:  1  . verapamil (CALAN) 80 MG tablet    Sig: Take 1 tablet (80 mg total) by  mouth 2 (two) times daily.    Dispense:  180 tablet    Refill:  1  . cetirizine (ZYRTEC) 10 MG tablet    Sig: Take 1 tablet (10 mg total) by mouth daily.    Dispense:  90 tablet    Refill:  1  . pantoprazole  (PROTONIX) 20 MG tablet    Sig: Take 1 tablet (20 mg total) by mouth daily as needed for heartburn or indigestion.    Dispense:  90 tablet    Refill:  1  . traMADol (ULTRAM) 50 MG tablet    Sig: Take 1 tablet (50 mg total) by mouth every 12 (twelve) hours as needed for moderate pain.    Dispense:  90 tablet    Refill:  1    Follow-up: Return in about 6 months (around 12/25/2016) for Follow-up on diabetes mellitus.   Arnoldo Morale MD

## 2016-06-30 ENCOUNTER — Telehealth: Payer: Self-pay

## 2016-06-30 NOTE — Telephone Encounter (Signed)
-----   Message from Arnoldo Morale, MD sent at 06/30/2016  8:42 AM EST ----- Kidney function is stable

## 2016-06-30 NOTE — Telephone Encounter (Signed)
Writer called patient with lab results per Dr. Jarold Song.  Patient states understanding.

## 2016-07-07 MED FILL — traMADol HCL 50 MG TABS: 50 | 45 days supply | Qty: 90 | Fill #0

## 2016-07-10 DIAGNOSIS — R002 Palpitations: Secondary | ICD-10-CM | POA: Insufficient documentation

## 2016-07-17 ENCOUNTER — Telehealth: Payer: Self-pay | Admitting: Cardiology

## 2016-07-17 NOTE — Telephone Encounter (Signed)
New message ° °Pt is returning call  ° °Please call back °

## 2016-07-17 NOTE — Telephone Encounter (Signed)
Gave patient normal event monitor results and to continue same meds. Sent message to Dr. Harrington Challenger to inquire about follow up.  Pt is feeling ok with no new symptoms/complaints.

## 2016-12-16 ENCOUNTER — Other Ambulatory Visit: Payer: Self-pay | Admitting: Family Medicine

## 2016-12-16 DIAGNOSIS — E1149 Type 2 diabetes mellitus with other diabetic neurological complication: Secondary | ICD-10-CM

## 2016-12-16 MED FILL — VERAPAMIL 80 MG TABLET: 80 | 90 days supply | Qty: 180 | Fill #1

## 2016-12-16 MED FILL — AMLODIPINE BESYLATE 10 MG T: 10 | 90 days supply | Qty: 90 | Fill #1

## 2016-12-16 MED FILL — traMADol HCL 50 MG TABS: 50 | 45 days supply | Qty: 90 | Fill #1

## 2016-12-17 MED FILL — glipiZIDE XL 5 MG TB24: 5 | 90 days supply | Qty: 90 | Fill #0

## 2016-12-19 ENCOUNTER — Ambulatory Visit: Payer: Self-pay | Attending: Family Medicine

## 2016-12-25 ENCOUNTER — Other Ambulatory Visit: Payer: Self-pay | Admitting: Family Medicine

## 2016-12-25 DIAGNOSIS — K219 Gastro-esophageal reflux disease without esophagitis: Secondary | ICD-10-CM

## 2017-01-02 ENCOUNTER — Ambulatory Visit: Payer: Self-pay | Admitting: Family Medicine

## 2017-01-05 ENCOUNTER — Emergency Department (HOSPITAL_COMMUNITY)
Admission: EM | Admit: 2017-01-05 | Discharge: 2017-01-05 | Disposition: A | Discharge status: Home / Self Care | Payer: Self-pay | Attending: Emergency Medicine | Admitting: Emergency Medicine

## 2017-01-05 ENCOUNTER — Encounter (HOSPITAL_COMMUNITY): Payer: Self-pay | Admitting: Emergency Medicine

## 2017-01-05 DIAGNOSIS — E1140 Type 2 diabetes mellitus with diabetic neuropathy, unspecified: Secondary | ICD-10-CM | POA: Insufficient documentation

## 2017-01-05 DIAGNOSIS — Z79899 Other long term (current) drug therapy: Secondary | ICD-10-CM | POA: Insufficient documentation

## 2017-01-05 DIAGNOSIS — M7662 Achilles tendinitis, left leg: Secondary | ICD-10-CM | POA: Insufficient documentation

## 2017-01-05 DIAGNOSIS — I130 Hypertensive heart and chronic kidney disease with heart failure and stage 1 through stage 4 chronic kidney disease, or unspecified chronic kidney disease: Secondary | ICD-10-CM | POA: Insufficient documentation

## 2017-01-05 DIAGNOSIS — Z7982 Long term (current) use of aspirin: Secondary | ICD-10-CM | POA: Insufficient documentation

## 2017-01-05 DIAGNOSIS — I5032 Chronic diastolic (congestive) heart failure: Secondary | ICD-10-CM | POA: Insufficient documentation

## 2017-01-05 DIAGNOSIS — N183 Chronic kidney disease, stage 3 (moderate): Secondary | ICD-10-CM | POA: Insufficient documentation

## 2017-01-05 LAB — BASIC METABOLIC PANEL
Anion gap: 10 (ref 5–15)
BUN: 21 mg/dL — ABNORMAL HIGH (ref 6–20)
CO2: 28 mmol/L (ref 22–32)
Calcium: 9.2 mg/dL (ref 8.9–10.3)
Chloride: 101 mmol/L (ref 101–111)
Creatinine, Ser: 1.37 mg/dL — ABNORMAL HIGH (ref 0.44–1.00)
GFR calc Af Amer: 44 mL/min — ABNORMAL LOW (ref >60)
GFR calc non Af Amer: 38 mL/min — ABNORMAL LOW (ref >60)
Glucose, Bld: 187 mg/dL — ABNORMAL HIGH (ref 65–99)
Potassium: 3.6 mmol/L (ref 3.5–5.1)
Sodium: 139 mmol/L (ref 135–145)

## 2017-01-05 LAB — URINALYSIS, ROUTINE W REFLEX MICROSCOPIC
Bacteria, UA: NONE SEEN
Bilirubin Urine: NEGATIVE
Glucose, UA: NEGATIVE mg/dL
Hgb urine dipstick: NEGATIVE
Ketones, ur: NEGATIVE mg/dL
Nitrite: NEGATIVE
Protein, ur: 100 mg/dL — AB
Specific Gravity, Urine: 1.005 (ref 1.005–1.030)
pH: 7 (ref 5.0–8.0)

## 2017-01-05 LAB — CBC
HCT: 35.6 % — ABNORMAL LOW (ref 36.0–46.0)
Hemoglobin: 12.3 g/dL (ref 12.0–15.0)
MCH: 28.1 pg (ref 26.0–34.0)
MCHC: 34.6 g/dL (ref 30.0–36.0)
MCV: 81.5 fL (ref 78.0–100.0)
Platelets: 201 10*3/uL (ref 150–400)
RBC: 4.37 MIL/uL (ref 3.87–5.11)
RDW: 13.3 % (ref 11.5–15.5)
WBC: 7.4 10*3/uL (ref 4.0–10.5)

## 2017-01-05 LAB — CBG MONITORING, ED: Glucose-Capillary: 249 mg/dL — ABNORMAL HIGH (ref 65–99)

## 2017-01-05 MED ORDER — ACETAMINOPHEN 500 MG PO TABS
1000.0000 mg | ORAL_TABLET | Freq: Once | ORAL | Status: AC
  Administered 2017-01-05: 1000 mg via ORAL
  Filled 2017-01-05: qty 2

## 2017-01-05 MED ORDER — COLCHICINE 0.6 MG PO TABS
1.2000 mg | ORAL_TABLET | Freq: Once | ORAL | Status: AC
  Administered 2017-01-05: 1.2 mg via ORAL
  Filled 2017-01-05: qty 2

## 2017-01-05 MED ORDER — COLCHICINE 0.6 MG PO TABS
0.6000 mg | ORAL_TABLET | Freq: Once | ORAL | Status: AC
  Administered 2017-01-05: 0.6 mg via ORAL
  Filled 2017-01-05: qty 1

## 2017-01-05 NOTE — ED Provider Notes (Signed)
[facility] DEPT Provider Note   CSN: [id number] Arrival date & time: 01/05/17  0900     History   Chief Complaint Chief Complaint  Patient presents with  . Foot Pain    HPI Paula [name] is a 71 y.o. female.  71 yo F with a chief complaint of left foot pain. This started a couple days ago with pain worse to the lateral aspect of the foot and now has moved to the posterior aspect of the ankle. Worse with movement and palpation. Denies fevers denies trauma. Today it had gotten much worse to where she had trouble bearing weight at all.   The history is provided by the patient.  Illness  This is a new problem. The current episode started yesterday. The problem occurs constantly. The problem has not changed since onset.Pertinent negatives include no chest pain, no headaches and no shortness of breath. Nothing aggravates the symptoms. Nothing relieves the symptoms. She has tried nothing for the symptoms. The treatment provided no relief.    Past Medical History:  Diagnosis Date  . Allergic rhinitis   . Arthritis   . Cough   . Diabetes mellitus   . Diastolic CHF, chronic ([place]) 10/11/2011  . GERD (gastroesophageal reflux disease)   . History of colon polyps 2012   tubular adenoma   . Hyperlipidemia   . Hypertension   . Neuropathy 10/11/2011  . PONV (postoperative nausea and vomiting)    trouble waking up    Patient Active Problem List   Diagnosis Date Noted  . Palpitations 07/10/2016  . Insomnia 03/25/2016  . Gout 01/23/2016  . History of cataract 02/27/2015  . Seasonal and perennial allergic rhinitis 02/15/2012  . Cough 11/04/2011  . Diabetic neuropathy ([place]) 10/11/2011  . Diastolic CHF, chronic ([place]) 10/11/2011  . CKD (chronic kidney disease) stage 3, GFR 30-59 ml/min 10/10/2011  . Type 2 diabetes mellitus ([place]) 07/03/2010  . Hyperlipidemia 07/03/2010  . Essential hypertension 07/03/2010    Past Surgical History:  Procedure Laterality Date  . CATARACT  EXTRACTION     right eye  . lymphatic mass surgery    . NASAL TURBINATE REDUCTION      OB History    Gravida Para Term Preterm AB Living   5 4 4   1 4    SAB TAB Ectopic Multiple Live Births     1             Home Medications    Prior to Admission medications   Medication Sig Start Date End Date Taking? Authorizing Provider  amLODipine (NORVASC) 10 MG tablet Take 1 tablet (10 mg total) by mouth daily with breakfast. 06/27/16  Yes Arnoldo Morale, MD  aspirin 81 MG tablet Take 81 mg by mouth daily.     Yes [provider]  calcium carbonate (OSCAL) 1500 (600 Ca) MG TABS tablet Take 1,500 mg by mouth daily with breakfast.   Yes [provider]  GLIPIZIDE XL 5 MG 24 hr tablet TAKE 1 TABLET BY MOUTH DAILY WITH BREAKFAST. 12/16/16  Yes Amao, Charlane Ferretti, MD  Polyethyl Glycol-Propyl Glycol (SYSTANE) 0.4-0.3 % SOLN Place 1 drop into both eyes 2 (two) times daily.   Yes [provider]  rosuvastatin (CRESTOR) 5 MG tablet Take 1 tablet (5 mg total) by mouth daily. 06/27/16  Yes Arnoldo Morale, MD  traMADol (ULTRAM) 50 MG tablet Take 1 tablet (50 mg total) by mouth every 12 (twelve) hours as needed for moderate pain. 06/27/16  Yes Amao, Charlane Ferretti,  MD  verapamil (CALAN) 80 MG tablet Take 1 tablet (80 mg total) by mouth 2 (two) times daily. 06/27/16  Yes Arnoldo Morale, MD  albuterol (PROVENTIL HFA;VENTOLIN HFA) 108 (90 Base) MCG/ACT inhaler Inhale 2 puffs into the lungs every 6 (six) hours as needed. Shortness of breath Patient not taking: Reported on 01/05/2017 06/27/16   Arnoldo Morale, MD  Blood Glucose Monitoring Suppl (TRUERESULT BLOOD GLUCOSE) w/Device KIT USE AS DIRECTED TO CHECK BLOOD SUGAR. 12/18/15   Tresa Garter, MD  cetirizine (ZYRTEC) 10 MG tablet Take 1 tablet (10 mg total) by mouth daily. Patient not taking: Reported on 01/05/2017 06/27/16   Arnoldo Morale, MD  diclofenac sodium (VOLTAREN) 1 % GEL Apply 1 application topically 4 (four) times daily. Patient not  taking: Reported on 06/27/2016 02/09/15   Janne Napoleon, NP  gabapentin (NEURONTIN) 300 MG capsule Take 1 capsule (300 mg total) by mouth 2 (two) times daily. Patient not taking: Reported on 01/05/2017 06/27/16   Arnoldo Morale, MD  glucose blood (TRUETEST TEST) test strip USE TO CHECK BLOOD AS DIRECTED ONCE DAILY 06/27/16   Arnoldo Morale, MD  glucose blood test strip Use as instructed 07/04/15   Chari Manning A, NP  hydrocortisone cream 1 % Apply 1 application topically 2 (two) times daily. Patient not taking: Reported on 06/27/2016 03/11/16   Arnoldo Morale, MD  Lancets (FREESTYLE) lancets Use as instructed 12/13/14   Chari Manning A, NP  mometasone (NASONEX) 50 MCG/ACT nasal spray Place 2 sprays into the nose daily. Patient not taking: Reported on 06/27/2016 01/23/16   Arnoldo Morale, MD  pantoprazole (PROTONIX) 20 MG tablet TAKE 1 TABLET BY MOUTH DAILY AS NEEDED FOR HEARTBURN OR INDIGESTION. Patient not taking: Reported on 01/05/2017 12/25/16   Maren Reamer, MD    Family History Family History  Problem Relation Age of Onset  . Hypertension Mother   . Asthma Sister   . Colon cancer Neg Hx     Social History Social History  Substance Use Topics  . Smoking status: Never Smoker  . Smokeless tobacco: Never Used  . Alcohol use No     Allergies   Dust mite extract; Ace inhibitors; and Lovastatin   Review of Systems Review of Systems  Constitutional: Negative for chills and fever.  HENT: Negative for congestion and rhinorrhea.   Eyes: Negative for redness and visual disturbance.  Respiratory: Negative for shortness of breath and wheezing.   Cardiovascular: Negative for chest pain and palpitations.  Gastrointestinal: Negative for nausea and vomiting.  Genitourinary: Negative for dysuria and urgency.  Musculoskeletal: Positive for arthralgias and myalgias.  Skin: Negative for pallor and wound.  Neurological: Negative for dizziness and headaches.     Physical Exam Updated Vital  Signs BP (!) 150/62 (BP Location: Right Arm)   Pulse 75   Temp 98.3 F (36.8 C) (Oral)   Resp 16   Ht 5' 2"  (1.575 m)   Wt 68 kg (150 lb)   LMP  (LMP Unknown)   SpO2 100%   BMI 27.44 kg/m   Physical Exam  Constitutional: She is oriented to person, place, and time. She appears well-developed and well-nourished. No distress.  HENT:  Head: Normocephalic and atraumatic.  Eyes: EOM are normal. Pupils are equal, round, and reactive to light.  Neck: Normal range of motion. Neck supple.  Cardiovascular: Normal rate and regular rhythm.  Exam reveals no gallop and no friction rub.   No murmur heard. Pulmonary/Chest: Effort normal. She has no wheezes. She has  no rales.  Abdominal: Soft. She exhibits no distension and no mass. There is no tenderness. There is no guarding.  Musculoskeletal: She exhibits tenderness (worse to the attachment of the Achilles to the calcaneus). She exhibits no edema.  Mild erythema. Range of motion limited to pain. Pulse motor and sensation intact to the left foot.  Neurological: She is alert and oriented to person, place, and time.  Skin: Skin is warm and dry. She is not diaphoretic.  Psychiatric: She has a normal mood and affect. Her behavior is normal.  Nursing note and vitals reviewed.    ED Treatments / Results  Labs (all labs ordered are listed, but only abnormal results are displayed) Labs Reviewed  BASIC METABOLIC PANEL - Abnormal; Notable for the following:       Result Value   Glucose, Bld 187 (*)    BUN 21 (*)    Creatinine, Ser 1.37 (*)    GFR calc non Af Amer 38 (*)    GFR calc Af Amer 44 (*)    All other components within normal limits  CBC - Abnormal; Notable for the following:    HCT 35.6 (*)    All other components within normal limits  URINALYSIS, ROUTINE W REFLEX MICROSCOPIC - Abnormal; Notable for the following:    Color, Urine STRAW (*)    Protein, ur 100 (*)    Leukocytes, UA TRACE (*)    Squamous Epithelial / LPF 0-5 (*)     All other components within normal limits  CBG MONITORING, ED - Abnormal; Notable for the following:    Glucose-Capillary 249 (*)    All other components within normal limits  CBG MONITORING, ED    EKG  EKG Interpretation None       Radiology No results found.  Procedures Procedures (including critical care time)  Medications Ordered in ED Medications  colchicine tablet 1.2 mg (1.2 mg Oral Given 01/05/17 1012)    Followed by  colchicine tablet 0.6 mg (0.6 mg Oral Given 01/05/17 1122)  acetaminophen (TYLENOL) tablet 1,000 mg (1,000 mg Oral Given 01/05/17 1012)     Initial Impression / Assessment and Plan / ED Course  I have reviewed the triage vital signs and the nursing notes.  Pertinent labs & imaging results that were available during my care of the patient were reviewed by me and considered in my medical decision making (see chart for details).     71 yo F With a chief complaint of left foot pain. On my exam it's localized to the attachment of the Achilles to the calcaneus. I suspect this is a overuse injury. Patient has limitation to NSAIDs that she has chronic kidney disease and is having trouble controlling her blood sugars side do not feel that steroids would be appropriate. The patient feels this is somewhat typical of her gout so I'll treat with colchicine here in the ED. She had labs drawn in triage, I will wait for those to return.  PCP follow up.  4:07 PM:  I have discussed the diagnosis/risks/treatment options with the patient and family and believe the pt to be eligible for discharge home to follow-up with PCP. We also discussed returning to the ED immediately if new or worsening sx occur. We discussed the sx which are most concerning (e.g., sudden worsening pain, fever, inability to tolerate by mouth) that necessitate immediate return. Medications administered to the patient during their visit and any new prescriptions provided to the patient are listed  below.  Medications given during this visit Medications  colchicine tablet 1.2 mg (1.2 mg Oral Given 01/05/17 1012)    Followed by  colchicine tablet 0.6 mg (0.6 mg Oral Given 01/05/17 1122)  acetaminophen (TYLENOL) tablet 1,000 mg (1,000 mg Oral Given 01/05/17 1012)     The patient appears reasonably screen and/or stabilized for discharge and I doubt any other medical condition or other [facility] requiring further screening, evaluation, or treatment in the ED at this time prior to discharge.    Final Clinical Impressions(s) / ED Diagnoses   Final diagnoses:  Tendonitis, Achilles, left    New Prescriptions Discharge Medication List as of 01/05/2017 11:44 AM       Deno Etienne, DO 01/05/17 1607

## 2017-01-05 NOTE — ED Notes (Signed)
Bed: WA02 Expected date:  Expected time:  Means of arrival:  Comments: 

## 2017-01-05 NOTE — ED Notes (Signed)
Bed: WLPT1 Expected date:  Expected time:  Means of arrival:  Comments: 

## 2017-01-05 NOTE — ED Notes (Signed)
Bed: WLPT2 Expected date:  Expected time:  Means of arrival:  Comments: 

## 2017-01-05 NOTE — ED Triage Notes (Signed)
Pt complaint of worsening left foot pain for past 10 days; hx of gout. Pt diabetic with CBG in the 200s; pt verbalizes complaint with home medications.

## 2017-01-05 NOTE — Discharge Instructions (Signed)
Follow up with the podiatrist as needed.   Take tylenol 1000mg (2 extra strength) four times a day.

## 2017-01-08 ENCOUNTER — Other Ambulatory Visit: Payer: Self-pay | Admitting: Family Medicine

## 2017-01-08 DIAGNOSIS — E7800 Pure hypercholesterolemia, unspecified: Secondary | ICD-10-CM

## 2017-01-08 MED FILL — ROSUVASTATIN CALCIUM 5 MG T: 5 | 90 days supply | Qty: 90 | Fill #1

## 2017-01-08 MED FILL — PANTOPRAZOLE SOD DR 20 MG T: 20 | 30 days supply | Qty: 30 | Fill #0

## 2017-01-12 ENCOUNTER — Ambulatory Visit: Payer: Self-pay | Admitting: Podiatry

## 2017-01-12 ENCOUNTER — Encounter: Payer: Self-pay | Admitting: Podiatry

## 2017-01-12 DIAGNOSIS — M7752 Other enthesopathy of left foot: Secondary | ICD-10-CM

## 2017-01-12 DIAGNOSIS — M7662 Achilles tendinitis, left leg: Secondary | ICD-10-CM

## 2017-01-12 MED ORDER — MELOXICAM 15 MG PO TABS: 15.0000 mg | ORAL_TABLET | Freq: Every day | ORAL | 0 refills | Status: DC

## 2017-01-12 MED ORDER — BETAMETHASONE SOD PHOS & ACET 6 (3-3) MG/ML IJ SUSP: 3.0000 mg | Freq: Once | INTRAMUSCULAR | Status: DC

## 2017-01-12 NOTE — Progress Notes (Signed)
   HPI: 71 year old female presents today for evaluation of left heel pain. Pains been going on for approximately one month now. Patient presented to the emergency department on 01/05/2017. Patient and x-rays taken which were negative for fracture. Patient presents today for follow-up treatment and evaluation   Physical Exam: General: The patient is alert and oriented x3 in no acute distress.  Dermatology: Skin is warm, dry and supple bilateral lower extremities. Negative for open lesions or macerations.  Vascular: Palpable pedal pulses bilaterally. No edema or erythema noted. Capillary refill within normal limits.  Neurological: Epicritic and protective threshold grossly intact bilaterally.   Musculoskeletal Exam: Pain on direct palpation to the insertion of the Achilles tendon left lower extremity posterior to the heel. There is some fluctuance noted suggestive of a retrocalcaneal bursitis.  Assessment: 1. Achilles tendinitis left lower extremity 2. Retrocalcaneal bursitis left lower extremity  Plan of Care:  1. Patient was evaluated. 2. Injection of 0.5 mL Celestone Soluspan injected into the retrocalcaneal bursa left lower extremity. Care was taken to avoid injection directly into the Achilles tendon. 3. Immobilization cam boot provided with compression anklet 4. Return to clinic in 4 weeks   Edrick Kins, DPM [facility]  Dr. Edrick Kins, [address]                                        [place], [place] [address]                Office [phone]  Fax [phone]

## 2017-01-30 ENCOUNTER — Other Ambulatory Visit: Payer: Self-pay | Admitting: Family Medicine

## 2017-01-30 DIAGNOSIS — M545 Low back pain, unspecified: Secondary | ICD-10-CM

## 2017-01-30 DIAGNOSIS — G8929 Other chronic pain: Secondary | ICD-10-CM

## 2017-02-02 ENCOUNTER — Encounter: Payer: Self-pay | Admitting: Family Medicine

## 2017-02-02 ENCOUNTER — Ambulatory Visit: Payer: Self-pay | Attending: Family Medicine | Admitting: Family Medicine

## 2017-02-02 VITALS — BP 127/69 | HR 68 | Temp 97.9°F | Wt 153.4 lb

## 2017-02-02 DIAGNOSIS — I5032 Chronic diastolic (congestive) heart failure: Secondary | ICD-10-CM | POA: Insufficient documentation

## 2017-02-02 DIAGNOSIS — E1149 Type 2 diabetes mellitus with other diabetic neurological complication: Secondary | ICD-10-CM

## 2017-02-02 DIAGNOSIS — M545 Low back pain, unspecified: Secondary | ICD-10-CM

## 2017-02-02 DIAGNOSIS — E1140 Type 2 diabetes mellitus with diabetic neuropathy, unspecified: Secondary | ICD-10-CM | POA: Insufficient documentation

## 2017-02-02 DIAGNOSIS — N183 Chronic kidney disease, stage 3 (moderate): Secondary | ICD-10-CM | POA: Insufficient documentation

## 2017-02-02 DIAGNOSIS — Z9889 Other specified postprocedural states: Secondary | ICD-10-CM | POA: Insufficient documentation

## 2017-02-02 DIAGNOSIS — I130 Hypertensive heart and chronic kidney disease with heart failure and stage 1 through stage 4 chronic kidney disease, or unspecified chronic kidney disease: Secondary | ICD-10-CM | POA: Insufficient documentation

## 2017-02-02 DIAGNOSIS — Z888 Allergy status to other drugs, medicaments and biological substances status: Secondary | ICD-10-CM | POA: Insufficient documentation

## 2017-02-02 DIAGNOSIS — Z7982 Long term (current) use of aspirin: Secondary | ICD-10-CM | POA: Insufficient documentation

## 2017-02-02 DIAGNOSIS — M1990 Unspecified osteoarthritis, unspecified site: Secondary | ICD-10-CM | POA: Insufficient documentation

## 2017-02-02 DIAGNOSIS — E1100 Type 2 diabetes mellitus with hyperosmolarity without nonketotic hyperglycemic-hyperosmolar coma (NKHHC): Secondary | ICD-10-CM | POA: Insufficient documentation

## 2017-02-02 DIAGNOSIS — G8929 Other chronic pain: Secondary | ICD-10-CM | POA: Insufficient documentation

## 2017-02-02 DIAGNOSIS — E7800 Pure hypercholesterolemia, unspecified: Secondary | ICD-10-CM | POA: Insufficient documentation

## 2017-02-02 DIAGNOSIS — E1122 Type 2 diabetes mellitus with diabetic chronic kidney disease: Secondary | ICD-10-CM | POA: Insufficient documentation

## 2017-02-02 DIAGNOSIS — K219 Gastro-esophageal reflux disease without esophagitis: Secondary | ICD-10-CM | POA: Insufficient documentation

## 2017-02-02 DIAGNOSIS — Z86010 Personal history of colonic polyps: Secondary | ICD-10-CM | POA: Insufficient documentation

## 2017-02-02 DIAGNOSIS — Z7984 Long term (current) use of oral hypoglycemic drugs: Secondary | ICD-10-CM | POA: Insufficient documentation

## 2017-02-02 DIAGNOSIS — Z9841 Cataract extraction status, right eye: Secondary | ICD-10-CM | POA: Insufficient documentation

## 2017-02-02 DIAGNOSIS — I10 Essential (primary) hypertension: Secondary | ICD-10-CM

## 2017-02-02 LAB — POCT GLYCOSYLATED HEMOGLOBIN (HGB A1C): Hemoglobin A1C: 7.4

## 2017-02-02 LAB — GLUCOSE, POCT (MANUAL RESULT ENTRY): POC Glucose: 165 mg/dl — AB (ref 70–99)

## 2017-02-02 MED ORDER — VERAPAMIL HCL 80 MG PO TABS: 80.0000 mg | ORAL_TABLET | Freq: Two times a day (BID) | ORAL | 1 refills | Status: DC

## 2017-02-02 MED ORDER — AMLODIPINE BESYLATE 10 MG PO TABS: 10.0000 mg | ORAL_TABLET | Freq: Every day | ORAL | 1 refills | Status: DC

## 2017-02-02 MED ORDER — ROSUVASTATIN CALCIUM 5 MG PO TABS
5.0000 mg | ORAL_TABLET | Freq: Every day | ORAL | 1 refills | Status: DC
Start: 2017-02-02 — End: 2017-02-02

## 2017-02-02 MED ORDER — CETIRIZINE HCL 10 MG PO TABS: 10.0000 mg | ORAL_TABLET | Freq: Every day | ORAL | 1 refills | Status: DC

## 2017-02-02 MED ORDER — ROSUVASTATIN CALCIUM 5 MG PO TABS: 5.0000 mg | ORAL_TABLET | Freq: Every day | ORAL | 1 refills | Status: DC

## 2017-02-02 MED ORDER — TRAMADOL HCL 50 MG PO TABS: 50.0000 mg | ORAL_TABLET | Freq: Two times a day (BID) | ORAL | 1 refills | Status: DC | PRN

## 2017-02-02 MED ORDER — GLIPIZIDE ER 5 MG PO TB24: 5.0000 mg | ORAL_TABLET | Freq: Every day | ORAL | 1 refills | Status: DC

## 2017-02-02 MED FILL — ?CETIRIZINE HCL 10 MG TABLE: 10 | 30 days supply | Qty: 30 | Fill #0

## 2017-02-02 MED FILL — traMADol HCL 50 MG TABS: 50 | 45 days supply | Qty: 90 | Fill #0

## 2017-02-02 NOTE — Progress Notes (Signed)
Subjective:  Patient ID: Paula [name], female    DOB: August 24, 1945  Age: 71 y.o. MRN: [id number]  CC: Diabetes   HPI Paula [name] is a 71 year old female with a history of type 2 diabetes mellitus (A1c 7.4 which is up from 6.7), hypertension, hypercholesterolemia, allergic rhinitis, stage III CKD (managed by [facility]) who comes into the clinic for a follow-up visit.  She has been compliant with her glipizide and denies hypoglycemia. Has also been compliant with a diabetic diet and exercise. She does have diabetic neuropathy which is controlled on gabapentin.  Tolerating her antihypertensive with no complains of side effects. She recently returned from [name] and had been compliant with all her medications but endorses ingesting foods high in carbs. She is requesting a refill for her pills.  She has no acute concerns today.  Past Medical History:  Diagnosis Date  . Allergic rhinitis   . Arthritis   . Cough   . Diabetes mellitus   . Diastolic CHF, chronic ([place]) 10/11/2011  . GERD (gastroesophageal reflux disease)   . History of colon polyps 2012   tubular adenoma   . Hyperlipidemia   . Hypertension   . Neuropathy 10/11/2011  . PONV (postoperative nausea and vomiting)    trouble waking up    Past Surgical History:  Procedure Laterality Date  . CATARACT EXTRACTION     right eye  . lymphatic mass surgery    . NASAL TURBINATE REDUCTION      Allergies  Allergen Reactions  . Dust Mite Extract Cough  . Ace Inhibitors Cough    chronic cough  . Lovastatin Other (See Comments)    Generalized body pain     Outpatient Medications Prior to Visit  Medication Sig Dispense Refill  . aspirin 81 MG tablet Take 81 mg by mouth daily.      . Blood Glucose Monitoring Suppl (TRUERESULT BLOOD GLUCOSE) w/Device KIT USE AS DIRECTED TO CHECK BLOOD SUGAR. 1 each 0  . calcium carbonate (OSCAL) 1500 (600 Ca) MG TABS tablet Take 1,500 mg by mouth daily with breakfast.    .  glucose blood (TRUETEST TEST) test strip USE TO CHECK BLOOD AS DIRECTED ONCE DAILY 100 each 1  . glucose blood test strip Use as instructed 100 each 12  . Lancets (FREESTYLE) lancets Use as instructed 100 each 12  . amLODipine (NORVASC) 10 MG tablet Take 1 tablet (10 mg total) by mouth daily with breakfast. 90 tablet 1  . GLIPIZIDE XL 5 MG 24 hr tablet TAKE 1 TABLET BY MOUTH DAILY WITH BREAKFAST. 90 tablet 0  . pantoprazole (PROTONIX) 20 MG tablet TAKE 1 TABLET BY MOUTH DAILY AS NEEDED FOR HEARTBURN OR INDIGESTION. 30 tablet 0  . rosuvastatin (CRESTOR) 5 MG tablet TAKE 1 TABLET BY MOUTH DAILY 30 tablet 0  . traMADol (ULTRAM) 50 MG tablet Take 1 tablet (50 mg total) by mouth every 12 (twelve) hours as needed for moderate pain. 90 tablet 1  . verapamil (CALAN) 80 MG tablet Take 1 tablet (80 mg total) by mouth 2 (two) times daily. 180 tablet 1  . albuterol (PROVENTIL HFA;VENTOLIN HFA) 108 (90 Base) MCG/ACT inhaler Inhale 2 puffs into the lungs every 6 (six) hours as needed. Shortness of breath (Patient not taking: Reported on 01/05/2017) 18 g 2  . mometasone (NASONEX) 50 MCG/ACT nasal spray Place 2 sprays into the nose daily. (Patient not taking: Reported on 06/27/2016) 15 g 2  . Polyethyl Glycol-Propyl Glycol (SYSTANE) 0.4-0.3 % SOLN  Place 1 drop into both eyes 2 (two) times daily.    . cetirizine (ZYRTEC) 10 MG tablet Take 1 tablet (10 mg total) by mouth daily. (Patient not taking: Reported on 01/05/2017) 90 tablet 1  . diclofenac sodium (VOLTAREN) 1 % GEL Apply 1 application topically 4 (four) times daily. (Patient not taking: Reported on 06/27/2016) 100 g 0  . gabapentin (NEURONTIN) 300 MG capsule Take 1 capsule (300 mg total) by mouth 2 (two) times daily. (Patient not taking: Reported on 01/05/2017) 180 capsule 1  . hydrocortisone cream 1 % Apply 1 application topically 2 (two) times daily. (Patient not taking: Reported on 06/27/2016) 30 g 0  . meloxicam (MOBIC) 15 MG tablet Take 1 tablet (15 mg  total) by mouth daily. (Patient not taking: Reported on 02/02/2017) 60 tablet 0   Facility-Administered Medications Prior to Visit  Medication Dose Route Frequency Provider Last Rate Last Dose  . betamethasone acetate-betamethasone sodium phosphate (CELESTONE) injection 3 mg  3 mg Intramuscular Once Daylene Katayama M, DPM        ROS Review of Systems  Constitutional: Negative for activity change, appetite change and fatigue.  HENT: Negative for congestion, sinus pressure and sore throat.   Eyes: Negative for visual disturbance.  Respiratory: Negative for cough, chest tightness, shortness of breath and wheezing.   Cardiovascular: Negative for chest pain and palpitations.  Gastrointestinal: Negative for abdominal distention, abdominal pain and constipation.  Endocrine: Negative for polydipsia.  Genitourinary: Negative for dysuria and frequency.  Musculoskeletal: Negative for arthralgias and back pain.  Skin: Negative for rash.  Neurological: Negative for tremors, light-headedness and numbness.  Hematological: Does not bruise/bleed easily.  Psychiatric/Behavioral: Negative for agitation and behavioral problems.    Objective:  BP 127/69   Pulse 68   Temp 97.9 F (36.6 C) (Oral)   Wt 153 lb 6.4 oz (69.6 kg)   LMP  (LMP Unknown)   SpO2 98%   BMI 28.06 kg/m   BP/Weight 02/02/2017 01/05/2017 16/05/9603  Systolic BP 540 981 191  Diastolic BP 69 62 70  Wt. (Lbs) 153.4 150 155  BMI 28.06 27.44 27.46      Physical Exam  Constitutional: She is oriented to person, place, and time. She appears well-developed and well-nourished.  Cardiovascular: Normal rate, normal heart sounds and intact distal pulses.   No murmur heard. Pulmonary/Chest: Effort normal and breath sounds normal. She has no wheezes. She has no rales. She exhibits no tenderness.  Abdominal: Soft. Bowel sounds are normal. She exhibits no distension and no mass. There is no tenderness.  Musculoskeletal: Normal range of motion.   Neurological: She is alert and oriented to person, place, and time.  Skin: Skin is warm and dry.  Psychiatric: She has a normal mood and affect.     Lab Results  Component Value Date   HGBA1C 7.4 02/02/2017    Assessment & Plan:   1. Type 2 diabetes mellitus with hyperosmolarity without coma, without long-term current use of insulin (HCC) A1c of 7.4 Stricter glycemic control to ensure A1c at goal of less than 7.0 Continue medications - POCT glucose (manual entry) - POCT glycosylated hemoglobin (Hb A1C)  2. Chronic midline low back pain without sciatica Stable  - traMADol (ULTRAM) 50 MG tablet; Take 1 tablet (50 mg total) by mouth every 12 (twelve) hours as needed for moderate pain.  Dispense: 90 tablet; Refill: 1  3. Essential hypertension controlled Low-sodium diet - verapamil (CALAN) 80 MG tablet; Take 1 tablet (80 mg total) by  mouth 2 (two) times daily.  Dispense: 180 tablet; Refill: 1 - amLODipine (NORVASC) 10 MG tablet; Take 1 tablet (10 mg total) by mouth daily with breakfast.  Dispense: 90 tablet; Refill: 1  4. Pure hypercholesterolemia Staple - rosuvastatin (CRESTOR) 5 MG tablet; Take 1 tablet (5 mg total) by mouth daily.  Dispense: 90 tablet; Refill: 1  5. Other diabetic neurological complication associated with type 2 diabetes mellitus (HCC) - glipiZIDE (GLIPIZIDE XL) 5 MG 24 hr tablet; Take 1 tablet (5 mg total) by mouth daily with breakfast.  Dispense: 90 tablet; Refill: 1   Meds ordered this encounter  Medications  . traMADol (ULTRAM) 50 MG tablet    Sig: Take 1 tablet (50 mg total) by mouth every 12 (twelve) hours as needed for moderate pain.    Dispense:  90 tablet    Refill:  1  . verapamil (CALAN) 80 MG tablet    Sig: Take 1 tablet (80 mg total) by mouth 2 (two) times daily.    Dispense:  180 tablet    Refill:  1  . amLODipine (NORVASC) 10 MG tablet    Sig: Take 1 tablet (10 mg total) by mouth daily with breakfast.    Dispense:  90 tablet     Refill:  1  . cetirizine (ZYRTEC) 10 MG tablet    Sig: Take 1 tablet (10 mg total) by mouth daily.    Dispense:  90 tablet    Refill:  1  . rosuvastatin (CRESTOR) 5 MG tablet    Sig: Take 1 tablet (5 mg total) by mouth daily.    Dispense:  90 tablet    Refill:  1    Must have office visit for refills  . glipiZIDE (GLIPIZIDE XL) 5 MG 24 hr tablet    Sig: Take 1 tablet (5 mg total) by mouth daily with breakfast.    Dispense:  90 tablet    Refill:  1    Follow-up: Return in about 3 months (around 05/05/2017) for Follow-up on chronic medical conditions.   This note has been created with Surveyor, quantity. Any transcriptional errors are unintentional.     Arnoldo Morale MD

## 2017-02-02 NOTE — Patient Instructions (Signed)
Diabetes Mellitus and Food It is important for you to manage your blood sugar (glucose) level. Your blood glucose level can be greatly affected by what you eat. Eating healthier foods in the appropriate amounts throughout the day at about the same time each day will help you control your blood glucose level. It can also help slow or prevent worsening of your diabetes mellitus. Healthy eating may even help you improve the level of your blood pressure and reach or maintain a healthy weight. General recommendations for healthful eating and cooking habits include:  Eating meals and snacks regularly. Avoid going long periods of time without eating to lose weight.  Eating a diet that consists mainly of plant-based foods, such as fruits, vegetables, nuts, legumes, and whole grains.  Using low-heat cooking methods, such as baking, instead of high-heat cooking methods, such as deep frying.  Work with your dietitian to make sure you understand how to use the Nutrition Facts information on food labels. How can food affect me? Carbohydrates Carbohydrates affect your blood glucose level more than any other type of food. Your dietitian will help you determine how many carbohydrates to eat at each meal and teach you how to count carbohydrates. Counting carbohydrates is important to keep your blood glucose at a healthy level, especially if you are using insulin or taking certain medicines for diabetes mellitus. Alcohol Alcohol can cause sudden decreases in blood glucose (hypoglycemia), especially if you use insulin or take certain medicines for diabetes mellitus. Hypoglycemia can be a life-threatening condition. Symptoms of hypoglycemia (sleepiness, dizziness, and disorientation) are similar to symptoms of having too much alcohol. If your health care provider has given you approval to drink alcohol, do so in moderation and use the following guidelines:  Women should not have more than one drink per day, and men  should not have more than two drinks per day. One drink is equal to: ? 12 oz of beer. ? 5 oz of wine. ? 1 oz of hard liquor.  Do not drink on an empty stomach.  Keep yourself hydrated. Have water, diet soda, or unsweetened iced tea.  Regular soda, juice, and other mixers might contain a lot of carbohydrates and should be counted.  What foods are not recommended? As you make food choices, it is important to remember that all foods are not the same. Some foods have fewer nutrients per serving than other foods, even though they might have the same number of calories or carbohydrates. It is difficult to get your body what it needs when you eat foods with fewer nutrients. Examples of foods that you should avoid that are high in calories and carbohydrates but low in nutrients include:  Trans fats (most processed foods list trans fats on the Nutrition Facts label).  Regular soda.  Juice.  Candy.  Sweets, such as cake, pie, doughnuts, and cookies.  Fried foods.  What foods can I eat? Eat nutrient-rich foods, which will nourish your body and keep you healthy. The food you should eat also will depend on several factors, including:  The calories you need.  The medicines you take.  Your weight.  Your blood glucose level.  Your blood pressure level.  Your cholesterol level.  You should eat a variety of foods, including:  Protein. ? Lean cuts of meat. ? Proteins low in saturated fats, such as fish, egg whites, and beans. Avoid processed meats.  Fruits and vegetables. ? Fruits and vegetables that may help control blood glucose levels, such as apples,   mangoes, and yams.  Dairy products. ? Choose fat-free or low-fat dairy products, such as milk, yogurt, and cheese.  Grains, bread, pasta, and rice. ? Choose whole grain products, such as multigrain bread, whole oats, and brown rice. These foods may help control blood pressure.  Fats. ? Foods containing healthful fats, such as  nuts, avocado, olive oil, canola oil, and fish.  Does everyone with diabetes mellitus have the same meal plan? Because every person with diabetes mellitus is different, there is not one meal plan that works for everyone. It is very important that you meet with a dietitian who will help you create a meal plan that is just right for you. This information is not intended to replace advice given to you by your health care provider. Make sure you discuss any questions you have with your health care provider. Document Released: 04/24/2005 Document Revised: 01/03/2016 Document Reviewed: 06/24/2013 Elsevier Interactive Patient Education  2017 Elsevier Inc.  

## 2017-02-02 NOTE — Telephone Encounter (Signed)
Patient came in to office

## 2017-02-09 ENCOUNTER — Encounter: Payer: Self-pay | Admitting: Podiatry

## 2017-02-09 ENCOUNTER — Ambulatory Visit: Payer: Self-pay | Admitting: Podiatry

## 2017-02-09 DIAGNOSIS — E0842 Diabetes mellitus due to underlying condition with diabetic polyneuropathy: Secondary | ICD-10-CM

## 2017-02-11 NOTE — Progress Notes (Signed)
   HPI: 71 year old female presents today for follow-up evaluation of left heel pain. She states her pain has improved. She has not worn the boot because it was too heavy. She has been wearing the compression anklet N states the injection has also helped to alleviate her pain. She is requesting diabetic shoes. She denies any new complaints at this time.   Physical Exam: General: The patient is alert and oriented x3 in no acute distress.  Dermatology: Skin is warm, dry and supple bilateral lower extremities. Negative for open lesions or macerations.  Vascular: Palpable pedal pulses bilaterally. No edema or erythema noted. Capillary refill within normal limits.  Neurological: Epicritic and protective threshold absent bilaterally.   Musculoskeletal Exam: Range of motion within normal limits to all pedal and ankle joints bilateral. Muscle strength 5/5 in all groups bilateral.   Assessment: 1. DM with neuropathy  Plan of Care:  1. Patient was evaluated. 2. Fitted for DM shoes today. Authorization for DM shoes initiated. 3. Return to clinic in 4 weeks to pick up DM shoes.   Edrick Kins, DPM [facility]  Dr. Edrick Kins, [address]                                        [place], [place] [address]                Office [phone]  Fax [phone]

## 2017-02-17 ENCOUNTER — Telehealth: Payer: Self-pay | Admitting: Podiatry

## 2017-02-17 NOTE — Telephone Encounter (Signed)
Calling to check to see if my shoes have come in.

## 2017-03-02 ENCOUNTER — Other Ambulatory Visit: Payer: Self-pay | Admitting: Family Medicine

## 2017-03-02 DIAGNOSIS — E1149 Type 2 diabetes mellitus with other diabetic neurological complication: Secondary | ICD-10-CM

## 2017-03-02 MED FILL — glipiZIDE XL 5 MG TB24: 5 | 30 days supply | Qty: 30 | Fill #0

## 2017-03-12 MED FILL — AMLODIPINE BESYLATE 10 MG T: 10 | 30 days supply | Qty: 30 | Fill #0

## 2017-03-12 MED FILL — glipiZIDE XL 5 MG TB24: 5 | 30 days supply | Qty: 30 | Fill #1

## 2017-03-12 MED FILL — VERAPAMIL 80 MG TABLET: 80 | 30 days supply | Qty: 60 | Fill #0

## 2017-03-16 ENCOUNTER — Other Ambulatory Visit: Payer: Self-pay | Admitting: Family Medicine

## 2017-03-16 DIAGNOSIS — G8929 Other chronic pain: Secondary | ICD-10-CM

## 2017-03-16 DIAGNOSIS — M545 Low back pain, unspecified: Secondary | ICD-10-CM

## 2017-03-16 MED FILL — TRUE METRIX TEST STRIP: 25 days supply | Qty: 100 | Fill #0

## 2017-03-18 ENCOUNTER — Other Ambulatory Visit: Payer: Self-pay | Admitting: Family Medicine

## 2017-03-18 DIAGNOSIS — G8929 Other chronic pain: Secondary | ICD-10-CM

## 2017-03-18 DIAGNOSIS — M545 Low back pain, unspecified: Secondary | ICD-10-CM

## 2017-03-18 MED FILL — traMADol HCL 50 MG TABS: 50 | 45 days supply | Qty: 90 | Fill #0

## 2017-03-24 ENCOUNTER — Encounter (HOSPITAL_COMMUNITY): Payer: Self-pay

## 2017-03-24 DIAGNOSIS — E1140 Type 2 diabetes mellitus with diabetic neuropathy, unspecified: Secondary | ICD-10-CM | POA: Insufficient documentation

## 2017-03-24 DIAGNOSIS — Z7982 Long term (current) use of aspirin: Secondary | ICD-10-CM | POA: Insufficient documentation

## 2017-03-24 DIAGNOSIS — I5032 Chronic diastolic (congestive) heart failure: Secondary | ICD-10-CM | POA: Insufficient documentation

## 2017-03-24 DIAGNOSIS — R002 Palpitations: Secondary | ICD-10-CM | POA: Insufficient documentation

## 2017-03-24 DIAGNOSIS — N183 Chronic kidney disease, stage 3 (moderate): Secondary | ICD-10-CM | POA: Insufficient documentation

## 2017-03-24 DIAGNOSIS — Z79899 Other long term (current) drug therapy: Secondary | ICD-10-CM | POA: Insufficient documentation

## 2017-03-24 DIAGNOSIS — I130 Hypertensive heart and chronic kidney disease with heart failure and stage 1 through stage 4 chronic kidney disease, or unspecified chronic kidney disease: Secondary | ICD-10-CM | POA: Insufficient documentation

## 2017-03-24 NOTE — ED Triage Notes (Signed)
Pt complains of having an irregular heartbeat This evening she says it felt like it was stopping and starting again

## 2017-03-25 ENCOUNTER — Emergency Department (HOSPITAL_COMMUNITY): Payer: Self-pay

## 2017-03-25 ENCOUNTER — Emergency Department (HOSPITAL_COMMUNITY)
Admission: EM | Admit: 2017-03-25 | Discharge: 2017-03-25 | Disposition: A | Discharge status: Home / Self Care | Payer: Self-pay | Attending: Emergency Medicine | Admitting: Emergency Medicine

## 2017-03-25 DIAGNOSIS — R002 Palpitations: Secondary | ICD-10-CM

## 2017-03-25 LAB — BASIC METABOLIC PANEL
Anion gap: 8 (ref 5–15)
BUN: 23 mg/dL — ABNORMAL HIGH (ref 6–20)
CO2: 26 mmol/L (ref 22–32)
Calcium: 9.1 mg/dL (ref 8.9–10.3)
Chloride: 105 mmol/L (ref 101–111)
Creatinine, Ser: 1.64 mg/dL — ABNORMAL HIGH (ref 0.44–1.00)
GFR calc Af Amer: 36 mL/min — ABNORMAL LOW (ref >60)
GFR calc non Af Amer: 31 mL/min — ABNORMAL LOW (ref >60)
Glucose, Bld: 131 mg/dL — ABNORMAL HIGH (ref 65–99)
Potassium: 3.9 mmol/L (ref 3.5–5.1)
Sodium: 139 mmol/L (ref 135–145)

## 2017-03-25 LAB — CBC
HCT: 35.9 % — ABNORMAL LOW (ref 36.0–46.0)
Hemoglobin: 12.8 g/dL (ref 12.0–15.0)
MCH: 28.7 pg (ref 26.0–34.0)
MCHC: 35.7 g/dL (ref 30.0–36.0)
MCV: 80.5 fL (ref 78.0–100.0)
Platelets: 196 10*3/uL (ref 150–400)
RBC: 4.46 MIL/uL (ref 3.87–5.11)
RDW: 13.2 % (ref 11.5–15.5)
WBC: 6.7 10*3/uL (ref 4.0–10.5)

## 2017-03-25 LAB — POCT I-STAT TROPONIN I
Troponin i, poc: 0 ng/mL (ref 0.00–0.08)
Troponin i, poc: 0 ng/mL (ref 0.00–0.08)

## 2017-03-25 NOTE — ED Provider Notes (Signed)
[facility] DEPT Provider Note   CSN: [id number] Arrival date & time: 03/24/17  2343     History   Chief Complaint Chief Complaint  Patient presents with  . Irregular Heart Beat    HPI Paula [name] is a 71 y.o. female.  The history is provided by the patient. No language interpreter was used.  Palpitations   This is a recurrent problem. The current episode started 6 to 12 hours ago. The problem occurs constantly. The problem has not changed since onset.The problem is associated with an unknown factor. Pertinent negatives include no diaphoresis, no fever, no malaise/fatigue, no numbness, no chest pain, no chest pressure, no claudication, no exertional chest pressure, no near-syncope, no orthopnea, no PND, no syncope, no abdominal pain, no nausea, no vomiting, no headaches, no back pain, no leg pain, no lower extremity edema, no dizziness, no weakness, no cough, no hemoptysis, no shortness of breath and no sputum production. She has tried nothing for the symptoms. The treatment provided moderate relief. Risk factors include post menopause. Her past medical history does not include hyperthyroidism.  States they have happened while she was here.    Past Medical History:  Diagnosis Date  . Allergic rhinitis   . Arthritis   . Cough   . Diabetes mellitus   . Diastolic CHF, chronic ([place]) 10/11/2011  . GERD (gastroesophageal reflux disease)   . History of colon polyps 2012   tubular adenoma   . Hyperlipidemia   . Hypertension   . Neuropathy 10/11/2011  . PONV (postoperative nausea and vomiting)    trouble waking up    Patient Active Problem List   Diagnosis Date Noted  . Palpitations 07/10/2016  . Insomnia 03/25/2016  . Gout 01/23/2016  . History of cataract 02/27/2015  . Seasonal and perennial allergic rhinitis 02/15/2012  . Cough 11/04/2011  . Diabetic neuropathy ([place]) 10/11/2011  . Diastolic CHF, chronic ([place]) 10/11/2011  . CKD (chronic kidney disease) stage 3, GFR  30-59 ml/min 10/10/2011  . Type 2 diabetes mellitus ([place]) 07/03/2010  . Hyperlipidemia 07/03/2010  . Essential hypertension 07/03/2010    Past Surgical History:  Procedure Laterality Date  . CATARACT EXTRACTION     right eye  . lymphatic mass surgery    . NASAL TURBINATE REDUCTION      OB History    Gravida Para Term Preterm AB Living   _0 SAB TAB Ectopic Multiple Live Births     1             Home Medications    Prior to Admission medications   Medication Sig Start Date End Date Taking? Authorizing Provider  amLODipine (NORVASC) 10 MG tablet Take 1 tablet (10 mg total) by mouth daily with breakfast. 02/02/17  Yes Arnoldo Morale, MD  aspirin 81 MG tablet Take 81 mg by mouth daily.     Yes [provider]  calcium carbonate (OSCAL) 1500 (600 Ca) MG TABS tablet Take 1,500 mg by mouth daily with breakfast.   Yes [provider]  cetirizine (ZYRTEC) 10 MG tablet Take 1 tablet (10 mg total) by mouth daily. 02/02/17  Yes Arnoldo Morale, MD  glipiZIDE (GLIPIZIDE XL) 5 MG 24 hr tablet Take 1 tablet (5 mg total) by mouth daily with breakfast. 02/02/17  Yes Amao, Charlane Ferretti, MD  Polyethyl Glycol-Propyl Glycol (SYSTANE) 0.4-0.3 % SOLN Place 1 drop into both eyes 2 (two) times daily.   Yes [provider]  rosuvastatin (  CRESTOR) 5 MG tablet Take 1 tablet (5 mg total) by mouth daily. 02/02/17  Yes Amao, Charlane Ferretti, MD  traMADol (ULTRAM) 50 MG tablet TAKE 1 TABLET BY MOUTH EVERY 12 HOURS AS NEEDED FOR MODERATE PAIN 03/16/17  Yes Arnoldo Morale, MD  verapamil (CALAN) 80 MG tablet Take 1 tablet (80 mg total) by mouth 2 (two) times daily. 02/02/17  Yes Arnoldo Morale, MD  albuterol (PROVENTIL HFA;VENTOLIN HFA) 108 (90 Base) MCG/ACT inhaler Inhale 2 puffs into the lungs every 6 (six) hours as needed. Shortness of breath Patient not taking: Reported on 01/05/2017 06/27/16   Arnoldo Morale, MD  Blood Glucose Monitoring Suppl (TRUERESULT BLOOD GLUCOSE) w/Device KIT USE AS DIRECTED  TO CHECK BLOOD SUGAR. 12/18/15   Angelica Chessman E, MD  glucose blood (TRUETEST TEST) test strip USE TO CHECK BLOOD AS DIRECTED ONCE DAILY 06/27/16   Arnoldo Morale, MD  glucose blood test strip Use as instructed 07/04/15   Lance Bosch, NP  Lancets (FREESTYLE) lancets Use as instructed 12/13/14   Lance Bosch, NP  mometasone (NASONEX) 50 MCG/ACT nasal spray Place 2 sprays into the nose daily. Patient not taking: Reported on 06/27/2016 01/23/16   Arnoldo Morale, MD    Family History Family History  Problem Relation Age of Onset  . Hypertension Mother   . Asthma Sister   . Colon cancer Neg Hx     Social History Social History  Substance Use Topics  . Smoking status: Never Smoker  . Smokeless tobacco: Never Used  . Alcohol use No     Allergies   Dust mite extract; Ace inhibitors; and Lovastatin   Review of Systems Review of Systems  Constitutional: Negative for diaphoresis, fever and malaise/fatigue.  Respiratory: Negative for cough, hemoptysis, sputum production, chest tightness and shortness of breath.   Cardiovascular: Positive for palpitations. Negative for chest pain, orthopnea, claudication, leg swelling, syncope, PND and near-syncope.  Gastrointestinal: Negative for abdominal pain, nausea and vomiting.  Musculoskeletal: Negative for back pain.  Neurological: Negative for dizziness, weakness, numbness and headaches.  All other systems reviewed and are negative.    Physical Exam Updated Vital Signs BP (!) 118/58 (BP Location: Left Arm)   Pulse 74   Temp 98.7 F (37.1 C) (Oral)   Resp 19   LMP  (LMP Unknown)   SpO2 98%   Physical Exam  Constitutional: She is oriented to person, place, and time. She appears well-developed and well-nourished. No distress.  HENT:  Head: Normocephalic and atraumatic.  Mouth/Throat: No oropharyngeal exudate.  Eyes: Pupils are equal, round, and reactive to light. Conjunctivae are normal.  Neck: Normal range of motion. Neck  supple. No JVD present.  Cardiovascular: Normal rate, regular rhythm, normal heart sounds and intact distal pulses.   Pulmonary/Chest: Effort normal and breath sounds normal. No stridor. No respiratory distress. She has no wheezes. She has no rales.  Abdominal: Soft. Bowel sounds are normal. She exhibits no mass. There is no tenderness. There is no guarding.  Musculoskeletal: Normal range of motion. She exhibits no edema, tenderness or deformity.  Neurological: She is alert and oriented to person, place, and time. She displays normal reflexes.  Skin: Skin is warm and dry. Capillary refill takes less than 2 seconds. She is not diaphoretic.  Psychiatric: She has a normal mood and affect.     ED Treatments / Results  Labs (all labs ordered are listed, but only abnormal results are displayed)  Results for orders placed or performed during the hospital encounter of  16/10/96  Basic metabolic panel  Result Value Ref Range   Sodium 139 135 - 145 mmol/L   Potassium 3.9 3.5 - 5.1 mmol/L   Chloride 105 101 - 111 mmol/L   CO2 26 22 - 32 mmol/L   Glucose, Bld 131 (H) 65 - 99 mg/dL   BUN 23 (H) 6 - 20 mg/dL   Creatinine, Ser 1.64 (H) 0.44 - 1.00 mg/dL   Calcium 9.1 8.9 - 10.3 mg/dL   GFR calc non Af Amer 31 (L) >60 mL/min   GFR calc Af Amer 36 (L) >60 mL/min   Anion gap 8 5 - 15  CBC  Result Value Ref Range   WBC 6.7 4.0 - 10.5 K/uL   RBC 4.46 3.87 - 5.11 MIL/uL   Hemoglobin 12.8 12.0 - 15.0 g/dL   HCT 35.9 (L) 36.0 - 46.0 %   MCV 80.5 78.0 - 100.0 fL   MCH 28.7 26.0 - 34.0 pg   MCHC 35.7 30.0 - 36.0 g/dL   RDW 13.2 11.5 - 15.5 %   Platelets 196 150 - 400 K/uL  POCT i-Stat troponin I  Result Value Ref Range   Troponin i, poc 0.00 0.00 - 0.08 ng/mL   Comment 3           Dg Chest 2 View  Result Date: 03/25/2017 CLINICAL DATA:  Chest pain.  Nonproductive cough for 1 week. EXAM: CHEST  2 VIEW COMPARISON:  09/04/2014 FINDINGS: The cardiomediastinal contours are normal. The lungs are  clear. Pulmonary vasculature is normal. No consolidation, pleural effusion, or pneumothorax. No acute osseous abnormalities are seen. IMPRESSION: No acute pulmonary process. Electronically Signed   By: Jeb Levering M.D.   On: 03/25/2017 01:11    EKG  EKG Interpretation  Date/Time:  Tuesday March 24 2017 23:53:51 EDT Ventricular Rate:  69 PR Interval:    QRS Duration: 90 QT Interval:  382 QTC Calculation: 410 R Axis:   -9 Text Interpretation:  Sinus rhythm PAC Prolonged PR interval Confirmed by Dory Horn) on 03/25/2017 1:55:47 AM       Radiology Dg Chest 2 View  Result Date: 03/25/2017 CLINICAL DATA:  Chest pain.  Nonproductive cough for 1 week. EXAM: CHEST  2 VIEW COMPARISON:  09/04/2014 FINDINGS: The cardiomediastinal contours are normal. The lungs are clear. Pulmonary vasculature is normal. No consolidation, pleural effusion, or pneumothorax. No acute osseous abnormalities are seen. IMPRESSION: No acute pulmonary process. Electronically Signed   By: Jeb Levering M.D.   On: 03/25/2017 01:11    Procedures Procedures (including critical care time)   Final Clinical Impressions(s) / ED Diagnoses  The patient is very well appearing and has been observed in the ED.  Strict return precautions given for  intractable abdominal pain, hard or rigid abdomen, inability to pass gas or stool, intractable vomiting or  Diarrhea, inability to tolerate oral liquids or foods, shortness of breath, changes in vision or thinking, chest pain, dyspnea on exertion, weakness or numbness or any concerns. No signs of systemic illness or infection. The patient is nontoxic-appearing on exam and vital signs are within normal limits.  Follow up with your PMD to schedule holter monitoring.   I have reviewed the triage vital signs and the nursing notes. Pertinent labs &imaging results that were available during my care of the patient were reviewed by me and considered in my medical decision  making (see chart for details).  After history, exam, and medical workup I feel the patient has been appropriately  medically screened and is safe for discharge home. Pertinent diagnoses were discussed with the patient. Patient was given return precautions.   Kynleigh Artz, MD 03/25/17 [phone]

## 2017-04-02 ENCOUNTER — Other Ambulatory Visit: Payer: Self-pay | Admitting: Family Medicine

## 2017-04-02 DIAGNOSIS — E7800 Pure hypercholesterolemia, unspecified: Secondary | ICD-10-CM

## 2017-04-06 MED FILL — ROSUVASTATIN CALCIUM 5 MG T: 5 | 30 days supply | Qty: 30 | Fill #0

## 2017-04-08 ENCOUNTER — Ambulatory Visit: Payer: Self-pay | Admitting: Podiatry

## 2017-04-08 ENCOUNTER — Telehealth: Payer: Self-pay | Admitting: Podiatry

## 2017-04-08 ENCOUNTER — Encounter: Payer: Self-pay | Admitting: Podiatry

## 2017-04-08 VITALS — BP 147/77 | HR 69

## 2017-04-08 DIAGNOSIS — E0842 Diabetes mellitus due to underlying condition with diabetic polyneuropathy: Secondary | ICD-10-CM

## 2017-04-08 NOTE — Telephone Encounter (Signed)
Pt was asking at her appt today with Dr Amalia Hailey about Diabetic shoes. She thought they were already ordered but they were not.   Upon checking pts only has the [employer] 100% coverage and it does not cover diabetic shoes/inserts. Pt then asked Dr Rebekah Chesterfield the cost andhe told pt we would call her. I discussed with Liliane Channel about the cost for cash pay and he said estimated about  210.00.  I called pt and gave her the estimated price and she is going to check to see if she can get some assistance with the cost and will let us know if she is able to get them

## 2017-04-08 NOTE — Progress Notes (Signed)
   HPI: 71 year old female presents today for follow-up evaluation of diabetes mellitus with neuropathy. Patient states that she's feeling much better regarding her heel pain. She no longer experiences any pain or symptoms. She presents today for follow-up regarding diabetes and possible   Physical Exam: General: The patient is alert and oriented x3 in no acute distress.  Dermatology: Skin is warm, dry and supple bilateral lower extremities. Negative for open lesions or macerations.  Vascular: Palpable pedal pulses bilaterally. No edema or erythema noted. Capillary refill within normal limits.  Neurological: Epicritic and protective threshold absent bilaterally.   Musculoskeletal Exam: Range of motion within normal limits to all pedal and ankle joints bilateral. Muscle strength 5/5 in all groups bilateral.   Assessment: 1. DM with neuropathy  Plan of Care:  1. Patient was evaluated. 2. Today we will follow up with the patient regarding her diabetic shoes. At the moment her current [facility] discount coverage does not cover DME products.  3. We will contact her regarding price for the shoes and diabetic insoles 4. Return to clinic when necessary  Edrick Kins, DPM [facility]  Dr. Edrick Kins, [address] [place]                                        [place], [place] [address]                Office [phone]  Fax [phone]

## 2017-04-14 MED FILL — VERAPAMIL 80 MG TABLET: 80 | 30 days supply | Qty: 60 | Fill #1

## 2017-04-14 MED FILL — AMLODIPINE BESYLATE 10 MG T: 10 | 30 days supply | Qty: 30 | Fill #1

## 2017-04-14 MED FILL — glipiZIDE XL 5 MG TB24: 5 | 30 days supply | Qty: 30 | Fill #2

## 2017-04-21 ENCOUNTER — Ambulatory Visit: Payer: Self-pay | Attending: Family Medicine

## 2017-05-04 ENCOUNTER — Other Ambulatory Visit: Payer: Self-pay | Admitting: Family Medicine

## 2017-05-04 DIAGNOSIS — M545 Low back pain, unspecified: Secondary | ICD-10-CM

## 2017-05-04 DIAGNOSIS — G8929 Other chronic pain: Secondary | ICD-10-CM

## 2017-05-04 MED ORDER — TRAMADOL HCL 50 MG PO TABS: ORAL_TABLET | ORAL | 1 refills | Status: DC

## 2017-05-04 MED FILL — traMADol HCL 50 MG TABS: 50 | 30 days supply | Qty: 60 | Fill #0

## 2017-05-04 MED FILL — ROSUVASTATIN CALCIUM 5 MG T: 5 | 30 days supply | Qty: 30 | Fill #1

## 2017-05-07 ENCOUNTER — Encounter: Payer: Self-pay | Admitting: Family Medicine

## 2017-05-07 ENCOUNTER — Ambulatory Visit: Payer: Self-pay | Attending: Family Medicine | Admitting: Family Medicine

## 2017-05-07 VITALS — BP 139/70 | HR 67 | Temp 97.5°F | Ht 63.0 in | Wt 156.2 lb

## 2017-05-07 DIAGNOSIS — Z9841 Cataract extraction status, right eye: Secondary | ICD-10-CM | POA: Insufficient documentation

## 2017-05-07 DIAGNOSIS — N183 Chronic kidney disease, stage 3 (moderate): Secondary | ICD-10-CM | POA: Insufficient documentation

## 2017-05-07 DIAGNOSIS — E1100 Type 2 diabetes mellitus with hyperosmolarity without nonketotic hyperglycemic-hyperosmolar coma (NKHHC): Secondary | ICD-10-CM | POA: Insufficient documentation

## 2017-05-07 DIAGNOSIS — G8929 Other chronic pain: Secondary | ICD-10-CM | POA: Insufficient documentation

## 2017-05-07 DIAGNOSIS — J328 Other chronic sinusitis: Secondary | ICD-10-CM

## 2017-05-07 DIAGNOSIS — Z7982 Long term (current) use of aspirin: Secondary | ICD-10-CM | POA: Insufficient documentation

## 2017-05-07 DIAGNOSIS — Z9889 Other specified postprocedural states: Secondary | ICD-10-CM | POA: Insufficient documentation

## 2017-05-07 DIAGNOSIS — Z888 Allergy status to other drugs, medicaments and biological substances status: Secondary | ICD-10-CM | POA: Insufficient documentation

## 2017-05-07 DIAGNOSIS — E7800 Pure hypercholesterolemia, unspecified: Secondary | ICD-10-CM | POA: Insufficient documentation

## 2017-05-07 DIAGNOSIS — B360 Pityriasis versicolor: Secondary | ICD-10-CM | POA: Insufficient documentation

## 2017-05-07 DIAGNOSIS — Z1159 Encounter for screening for other viral diseases: Secondary | ICD-10-CM | POA: Insufficient documentation

## 2017-05-07 DIAGNOSIS — G894 Chronic pain syndrome: Secondary | ICD-10-CM | POA: Insufficient documentation

## 2017-05-07 DIAGNOSIS — K0510 Chronic gingivitis, plaque induced: Secondary | ICD-10-CM | POA: Insufficient documentation

## 2017-05-07 DIAGNOSIS — I130 Hypertensive heart and chronic kidney disease with heart failure and stage 1 through stage 4 chronic kidney disease, or unspecified chronic kidney disease: Secondary | ICD-10-CM | POA: Insufficient documentation

## 2017-05-07 DIAGNOSIS — Z23 Encounter for immunization: Secondary | ICD-10-CM | POA: Insufficient documentation

## 2017-05-07 DIAGNOSIS — E1122 Type 2 diabetes mellitus with diabetic chronic kidney disease: Secondary | ICD-10-CM | POA: Insufficient documentation

## 2017-05-07 DIAGNOSIS — I10 Essential (primary) hypertension: Secondary | ICD-10-CM

## 2017-05-07 DIAGNOSIS — J329 Chronic sinusitis, unspecified: Secondary | ICD-10-CM | POA: Insufficient documentation

## 2017-05-07 DIAGNOSIS — Z7984 Long term (current) use of oral hypoglycemic drugs: Secondary | ICD-10-CM | POA: Insufficient documentation

## 2017-05-07 DIAGNOSIS — E1140 Type 2 diabetes mellitus with diabetic neuropathy, unspecified: Secondary | ICD-10-CM | POA: Insufficient documentation

## 2017-05-07 DIAGNOSIS — E1149 Type 2 diabetes mellitus with other diabetic neurological complication: Secondary | ICD-10-CM

## 2017-05-07 DIAGNOSIS — I5032 Chronic diastolic (congestive) heart failure: Secondary | ICD-10-CM | POA: Insufficient documentation

## 2017-05-07 DIAGNOSIS — Z91048 Other nonmedicinal substance allergy status: Secondary | ICD-10-CM | POA: Insufficient documentation

## 2017-05-07 DIAGNOSIS — Z86010 Personal history of colonic polyps: Secondary | ICD-10-CM | POA: Insufficient documentation

## 2017-05-07 LAB — GLUCOSE, POCT (MANUAL RESULT ENTRY): POC Glucose: 240 mg/dl — AB (ref 70–99)

## 2017-05-07 LAB — POCT GLYCOSYLATED HEMOGLOBIN (HGB A1C): Hemoglobin A1C: 8

## 2017-05-07 MED ORDER — ACYCLOVIR 400 MG PO TABS: 400.0000 mg | ORAL_TABLET | Freq: Three times a day (TID) | ORAL | 0 refills | Status: DC

## 2017-05-07 MED ORDER — ROSUVASTATIN CALCIUM 5 MG PO TABS: 5.0000 mg | ORAL_TABLET | Freq: Every day | ORAL | 1 refills | Status: DC

## 2017-05-07 MED ORDER — AMLODIPINE BESYLATE 10 MG PO TABS: 10.0000 mg | ORAL_TABLET | Freq: Every day | ORAL | 1 refills | Status: DC

## 2017-05-07 MED ORDER — CETIRIZINE HCL 10 MG PO TABS: 10.0000 mg | ORAL_TABLET | Freq: Every day | ORAL | 1 refills | Status: DC

## 2017-05-07 MED ORDER — GLIPIZIDE ER 10 MG PO TB24: 10.0000 mg | ORAL_TABLET | Freq: Every day | ORAL | 1 refills | Status: DC

## 2017-05-07 MED ORDER — CLOTRIMAZOLE 1 % EX CREA: 1.0000 "application " | TOPICAL_CREAM | Freq: Two times a day (BID) | CUTANEOUS | 0 refills | Status: DC

## 2017-05-07 MED ORDER — VERAPAMIL HCL 80 MG PO TABS: 80.0000 mg | ORAL_TABLET | Freq: Two times a day (BID) | ORAL | 1 refills | Status: DC

## 2017-05-07 MED FILL — CETIRIZINE HCL 10 MG TABLET: 10 | 90 days supply | Qty: 90 | Fill #0 | Status: TO

## 2017-05-07 MED FILL — glipiZIDE XL 10 MG TB24: 10 | 90 days supply | Qty: 90 | Fill #0

## 2017-05-07 MED FILL — VERAPAMIL 80 MG TABLET: 80 | 30 days supply | Qty: 60 | Fill #0

## 2017-05-07 MED FILL — ACYCLOVIR 400 MG TABLET: 400 | 3 days supply | Qty: 10 | Fill #0

## 2017-05-07 NOTE — Progress Notes (Signed)
Subjective:  Patient ID: Paula [name], female    DOB: 27-Jun-1946  Age: 71 y.o. MRN: [id number]  CC: Diabetes   HPI SHEELAH [name] is a 71 year old female with a history of type 2 diabetes mellitus (A1c 8.0 which is up from ), hypertension, hypercholesterolemia, allergic rhinitis, stage III CKD (managed by [facility]) who comes into the clinic for a follow-up visit.  Her blood sugars have been high lately and today is 240. She endorses compliance with a diabetic diet, her medications and tries to exercise. Denies hypoglycemia, visual complaints or numbness in extremities.  Tolerates her antihypertensive with no adverse effects and for her cholesterol she has been taking Crestor which she tolerates with no complaints of myalgias.  She does have chronic rhinitis and takes Zyrtec for it.  Also informs me that tramadol helps with her postnasal drip and when I go on to inform her that is not an indication for tramadol use she informs me that her pulmonologist-Dr. Joya Gaskins placed her on that and she has taken it for years. She also informs me of chronic pain that she has had for years ever since she sustained a fall from a high rise building with resulting broken bones several years ago. She points to her neck, low back and the knees status post of her pain improved and is willing to work on being tapered off it when we discussed the potential for addiction.  Past Medical History:  Diagnosis Date  . Allergic rhinitis   . Arthritis   . Cough   . Diabetes mellitus   . Diastolic CHF, chronic ([place]) 10/11/2011  . GERD (gastroesophageal reflux disease)   . History of colon polyps 2012   tubular adenoma   . Hyperlipidemia   . Hypertension   . Neuropathy 10/11/2011  . PONV (postoperative nausea and vomiting)    trouble waking up    Past Surgical History:  Procedure Laterality Date  . CATARACT EXTRACTION     right eye  . lymphatic mass surgery    . NASAL TURBINATE REDUCTION       Allergies  Allergen Reactions  . Dust Mite Extract Cough  . Ace Inhibitors Cough    chronic cough  . Lovastatin Other (See Comments)    Generalized body pain     Outpatient Medications Prior to Visit  Medication Sig Dispense Refill  . albuterol (PROVENTIL HFA;VENTOLIN HFA) 108 (90 Base) MCG/ACT inhaler Inhale 2 puffs into the lungs every 6 (six) hours as needed. Shortness of breath 18 g 2  . aspirin 81 MG tablet Take 81 mg by mouth daily.      . Blood Glucose Monitoring Suppl (TRUERESULT BLOOD GLUCOSE) w/Device KIT USE AS DIRECTED TO CHECK BLOOD SUGAR. 1 each 0  . calcium carbonate (OSCAL) 1500 (600 Ca) MG TABS tablet Take 1,500 mg by mouth daily with breakfast.    . glucose blood (TRUETEST TEST) test strip USE TO CHECK BLOOD AS DIRECTED ONCE DAILY 100 each 1  . glucose blood test strip Use as instructed 100 each 12  . Lancets (FREESTYLE) lancets Use as instructed 100 each 12  . Polyethyl Glycol-Propyl Glycol (SYSTANE) 0.4-0.3 % SOLN Place 1 drop into both eyes 2 (two) times daily.    . traMADol (ULTRAM) 50 MG tablet TAKE 1 TABLET BY MOUTH EVERY 12 HOURS AS NEEDED FOR MODERATE PAIN 60 tablet 1  . amLODipine (NORVASC) 10 MG tablet Take 1 tablet (10 mg total) by mouth daily with breakfast. 90 tablet 1  .  cetirizine (ZYRTEC) 10 MG tablet Take 1 tablet (10 mg total) by mouth daily. 90 tablet 1  . glipiZIDE (GLIPIZIDE XL) 5 MG 24 hr tablet Take 1 tablet (5 mg total) by mouth daily with breakfast. 90 tablet 1  . rosuvastatin (CRESTOR) 5 MG tablet Take 1 tablet (5 mg total) by mouth daily. 90 tablet 1  . verapamil (CALAN) 80 MG tablet Take 1 tablet (80 mg total) by mouth 2 (two) times daily. 180 tablet 1  . mometasone (NASONEX) 50 MCG/ACT nasal spray Place 2 sprays into the nose daily. (Patient not taking: Reported on 06/27/2016) 15 g 2  . betamethasone acetate-betamethasone sodium phosphate (CELESTONE) injection 3 mg      No facility-administered medications prior to visit.      ROS Review of Systems  Constitutional: Negative for activity change, appetite change and fatigue.  HENT: Negative for congestion, sinus pressure and sore throat.   Eyes: Negative for visual disturbance.  Respiratory: Negative for cough, chest tightness, shortness of breath and wheezing.   Cardiovascular: Negative for chest pain and palpitations.  Gastrointestinal: Negative for abdominal distention, abdominal pain and constipation.  Endocrine: Negative for polydipsia.  Genitourinary: Negative for dysuria and frequency.  Musculoskeletal: Negative for arthralgias and back pain.  Skin: Positive for rash.  Neurological: Negative for tremors, light-headedness and numbness.  Hematological: Does not bruise/bleed easily.  Psychiatric/Behavioral: Negative for agitation and behavioral problems.    Objective:  BP 139/70   Pulse 67   Temp (!) 97.5 F (36.4 C) (Oral)   Ht 5' 3"  (1.6 m)   Wt 156 lb 3.2 oz (70.9 kg)   LMP  (LMP Unknown)   SpO2 98%   BMI 27.67 kg/m   BP/Weight 05/07/2017 04/08/2017 4/58/5929  Systolic BP 244 628 638  Diastolic BP 70 77 60  Wt. (Lbs) 156.2 - -  BMI 27.67 - -      Physical Exam  Constitutional: She is oriented to person, place, and time. She appears well-developed and well-nourished.  HENT:  Gingival Hypertrophy without inflammation  Cardiovascular: Normal rate, normal heart sounds and intact distal pulses.   No murmur heard. Pulmonary/Chest: Effort normal and breath sounds normal. She has no wheezes. She has no rales. She exhibits no tenderness.  Abdominal: Soft. Bowel sounds are normal. She exhibits no distension and no mass. There is no tenderness.  Musculoskeletal: Normal range of motion.  Neurological: She is alert and oriented to person, place, and time.  Skin: Skin is warm and dry.  Hypopigmented patches sparsely distributed on forearms  Psychiatric: She has a normal mood and affect.    CMP Latest Ref Rng & Units 03/25/2017 01/05/2017  06/27/2016  Glucose 65 - 99 mg/dL 131(H) 187(H) 242(H)  BUN 6 - 20 mg/dL 23(H) 21(H) 18  Creatinine 0.44 - 1.00 mg/dL 1.64(H) 1.37(H) 1.52(H)  Sodium 135 - 145 mmol/L 139 139 137  Potassium 3.5 - 5.1 mmol/L 3.9 3.6 3.9  Chloride 101 - 111 mmol/L 105 101 101  CO2 22 - 32 mmol/L 26 28 27   Calcium 8.9 - 10.3 mg/dL 9.1 9.2 9.6  Total Protein 6.1 - 8.1 g/dL - - 7.2  Total Bilirubin 0.2 - 1.2 mg/dL - - 0.4  Alkaline Phos 33 - 130 U/L - - 87  AST 10 - 35 U/L - - 22  ALT 6 - 29 U/L - - 27    Lipid Panel     Component Value Date/Time   CHOL 156 01/25/2016 0901   TRIG 98 01/25/2016  0901   HDL 54 01/25/2016 0901   CHOLHDL 2.9 01/25/2016 0901   VLDL 20 01/25/2016 0901   LDLCALC 82 01/25/2016 0901     Lab Results  Component Value Date   HGBA1C 8.0 05/07/2017    Assessment & Plan:   1. Type 2 diabetes mellitus with hyperosmolarity without coma, without long-term current use of insulin (HCC) Uncontrolled with A1c of 8.0 which has trended up from 7.4 previously Increased dose of glipizide from 5 mg to 10 mg Diabetic diet, lifestyle modifications - POCT glucose (manual entry) - POCT glycosylated hemoglobin (Hb A1C) - CMP14+EGFR; Future - Microalbumin/Creatinine Ratio, Urine; Future  2. Tinea versicolor - clotrimazole (LOTRIMIN) 1 % cream; Apply 1 application topically 2 (two) times daily.  Dispense: 30 g; Refill: 0  3. Other diabetic neurological complication associated with type 2 diabetes mellitus (HCC) - glipiZIDE (GLUCOTROL XL) 10 MG 24 hr tablet; Take 1 tablet (10 mg total) by mouth daily with breakfast.  Dispense: 90 tablet; Refill: 1  4. Chronic pain syndrome Patient currently takes tramadol which she had taken for years -she points to different body parts as the source of the pain but states tramadol does help him mostly with postnasal drip We have discussed the potential for addiction and I am unable to  identify any medical indication to continue prescribing this  medication for her We have discussed tapering instructions and if she has pain we might have to place her on a muscle relaxant  5. Essential hypertension Controlled Low-sodium diet - amLODipine (NORVASC) 10 MG tablet; Take 1 tablet (10 mg total) by mouth daily with breakfast.  Dispense: 90 tablet; Refill: 1 - verapamil (CALAN) 80 MG tablet; Take 1 tablet (80 mg total) by mouth 2 (two) times daily.  Dispense: 180 tablet; Refill: 1  6. Pure hypercholesterolemia Controlled - Lipid panel; Future - rosuvastatin (CRESTOR) 5 MG tablet; Take 1 tablet (5 mg total) by mouth daily.  Dispense: 90 tablet; Refill: 1  7. Gingivostomatitis Resolving - acyclovir (ZOVIRAX) 400 MG tablet; Take 1 tablet (400 mg total) by mouth 3 (three) times daily.  Dispense: 10 tablet; Refill: 0  8. Need for hepatitis C screening test - Hepatitis c antibody (reflex); Future  9. Need for influenza vaccination Flu shot administered  10. Other chronic sinusitis Continue Zyrtec   Meds ordered this encounter  Medications  . glipiZIDE (GLUCOTROL XL) 10 MG 24 hr tablet    Sig: Take 1 tablet (10 mg total) by mouth daily with breakfast.    Dispense:  90 tablet    Refill:  1    Discontinue previous dose-ninety-day supply  . cetirizine (ZYRTEC) 10 MG tablet    Sig: Take 1 tablet (10 mg total) by mouth daily.    Dispense:  90 tablet    Refill:  1    90 days supply  . amLODipine (NORVASC) 10 MG tablet    Sig: Take 1 tablet (10 mg total) by mouth daily with breakfast.    Dispense:  90 tablet    Refill:  1    Ninety-day supply  . rosuvastatin (CRESTOR) 5 MG tablet    Sig: Take 1 tablet (5 mg total) by mouth daily.    Dispense:  90 tablet    Refill:  1    Must have office visit for refills  . verapamil (CALAN) 80 MG tablet    Sig: Take 1 tablet (80 mg total) by mouth 2 (two) times daily.    Dispense:  180 tablet  Refill:  1    Ninety-day supply  . clotrimazole (LOTRIMIN) 1 % cream    Sig: Apply 1 application  topically 2 (two) times daily.    Dispense:  30 g    Refill:  0  . acyclovir (ZOVIRAX) 400 MG tablet    Sig: Take 1 tablet (400 mg total) by mouth 3 (three) times daily.    Dispense:  10 tablet    Refill:  0    Follow-up: Return in about 3 months (around 08/06/2017) for follow up on Chronic medical conditions.   Arnoldo Morale MD

## 2017-05-08 ENCOUNTER — Ambulatory Visit: Payer: Self-pay | Attending: Family Medicine

## 2017-05-08 DIAGNOSIS — E7800 Pure hypercholesterolemia, unspecified: Secondary | ICD-10-CM | POA: Insufficient documentation

## 2017-05-08 DIAGNOSIS — Z1159 Encounter for screening for other viral diseases: Secondary | ICD-10-CM | POA: Insufficient documentation

## 2017-05-08 DIAGNOSIS — E1100 Type 2 diabetes mellitus with hyperosmolarity without nonketotic hyperglycemic-hyperosmolar coma (NKHHC): Secondary | ICD-10-CM | POA: Insufficient documentation

## 2017-05-08 NOTE — Progress Notes (Signed)
Patient here for lab visit  

## 2017-05-09 LAB — CMP14+EGFR
ALT: 19 IU/L (ref 0–32)
AST: 18 IU/L (ref 0–40)
Albumin/Globulin Ratio: 1.3 (ref 1.2–2.2)
Albumin: 4.1 g/dL (ref 3.5–4.8)
Alkaline Phosphatase: 85 IU/L (ref 39–117)
BUN/Creatinine Ratio: 14 (ref 12–28)
BUN: 22 mg/dL (ref 8–27)
Bilirubin Total: 0.4 mg/dL (ref 0.0–1.2)
CO2: 25 mmol/L (ref 20–29)
Calcium: 9.5 mg/dL (ref 8.7–10.3)
Chloride: 102 mmol/L (ref 96–106)
Creatinine, Ser: 1.58 mg/dL — ABNORMAL HIGH (ref 0.57–1.00)
GFR calc Af Amer: 38 mL/min/{1.73_m2} — ABNORMAL LOW (ref >59)
GFR calc non Af Amer: 33 mL/min/{1.73_m2} — ABNORMAL LOW (ref >59)
Globulin, Total: 3.1 g/dL (ref 1.5–4.5)
Glucose: 201 mg/dL — ABNORMAL HIGH (ref 65–99)
Potassium: 4.1 mmol/L (ref 3.5–5.2)
Sodium: 143 mmol/L (ref 134–144)
Total Protein: 7.2 g/dL (ref 6.0–8.5)

## 2017-05-09 LAB — LIPID PANEL
Chol/HDL Ratio: 3.2 ratio (ref 0.0–4.4)
Cholesterol, Total: 145 mg/dL (ref 100–199)
HDL: 46 mg/dL (ref >39)
LDL Calculated: 81 mg/dL (ref 0–99)
Triglycerides: 90 mg/dL (ref 0–149)
VLDL Cholesterol Cal: 18 mg/dL (ref 5–40)

## 2017-05-09 LAB — MICROALBUMIN / CREATININE URINE RATIO
Creatinine, Urine: 148.6 mg/dL
Microalb/Creat Ratio: 1696.6 mg/g creat — ABNORMAL HIGH (ref 0.0–30.0)
Microalbumin, Urine: 2521.2 ug/mL

## 2017-05-09 LAB — HEPATITIS C ANTIBODY (REFLEX): HCV Ab: <0.1 s/co ratio (ref 0.0–0.9)

## 2017-05-09 LAB — HCV COMMENT:

## 2017-05-14 ENCOUNTER — Telehealth: Payer: Self-pay

## 2017-05-14 NOTE — Telephone Encounter (Signed)
Pt was called and informed of lab results. 

## 2017-06-02 MED FILL — traMADol HCL 50 MG TABS: 50 | 30 days supply | Qty: 60 | Fill #1

## 2017-06-02 MED FILL — ROSUVASTATIN CALCIUM 5 MG T: 5 | 30 days supply | Qty: 30 | Fill #2

## 2017-06-11 ENCOUNTER — Other Ambulatory Visit: Payer: Self-pay | Admitting: Family Medicine

## 2017-06-11 DIAGNOSIS — I10 Essential (primary) hypertension: Secondary | ICD-10-CM

## 2017-06-11 MED FILL — VERAPAMIL 80 MG TABLET: 80 | 90 days supply | Qty: 180 | Fill #1 | Status: TO

## 2017-06-17 ENCOUNTER — Encounter (HOSPITAL_COMMUNITY): Payer: Self-pay

## 2017-06-29 ENCOUNTER — Telehealth: Payer: Self-pay | Admitting: Family Medicine

## 2017-06-29 NOTE — Telephone Encounter (Signed)
Refill denied - please refer to my last note regarding my conversation with the patient; I will no longer be prescribing Tramadol

## 2017-06-29 NOTE — Telephone Encounter (Signed)
Pt. Called requesting a refill on Tramadol. Please f/u °

## 2017-06-30 MED ORDER — TIZANIDINE HCL 4 MG PO TABS: 4.0000 mg | ORAL_TABLET | Freq: Two times a day (BID) | ORAL | 1 refills | Status: DC | PRN

## 2017-06-30 NOTE — Telephone Encounter (Signed)
Done

## 2017-06-30 NOTE — Telephone Encounter (Signed)
Will inform PCP to send medication to onsite pharmacy.

## 2017-06-30 NOTE — Telephone Encounter (Signed)
Pt called back to speak with the nurse, she is changing her mind now she want the muscle relaxer to please sent it to the [facility] pharmacy and she will pick it tomorrow, please follow up

## 2017-06-30 NOTE — Telephone Encounter (Signed)
Pt was called and informed that tramadol will no longer be prescribed, pt states that she would like to talk to Dr, pt also states that she does not want the muscle relaxer and needs some type of medication by Wednesday. Please follow up.

## 2017-07-01 MED FILL — tiZANidine HCL 4 MG TABS: 4 | 30 days supply | Qty: 60 | Fill #0 | Status: TO

## 2017-07-01 NOTE — Telephone Encounter (Signed)
Pt was called and there is no VM set up to leave a message is pt returns phone call please inform pt that muscle relaxer has been sent tot onsite pharmacy.

## 2017-07-02 ENCOUNTER — Emergency Department (HOSPITAL_COMMUNITY)
Admission: EM | Admit: 2017-07-02 | Discharge: 2017-07-02 | Disposition: A | Discharge status: Home / Self Care | Payer: Self-pay | Attending: Emergency Medicine | Admitting: Emergency Medicine

## 2017-07-02 ENCOUNTER — Emergency Department (HOSPITAL_COMMUNITY): Payer: Self-pay

## 2017-07-02 ENCOUNTER — Encounter (HOSPITAL_COMMUNITY): Payer: Self-pay | Admitting: Emergency Medicine

## 2017-07-02 DIAGNOSIS — F1193 Opioid use, unspecified with withdrawal: Secondary | ICD-10-CM

## 2017-07-02 DIAGNOSIS — Z7982 Long term (current) use of aspirin: Secondary | ICD-10-CM | POA: Insufficient documentation

## 2017-07-02 DIAGNOSIS — I110 Hypertensive heart disease with heart failure: Secondary | ICD-10-CM | POA: Insufficient documentation

## 2017-07-02 DIAGNOSIS — E119 Type 2 diabetes mellitus without complications: Secondary | ICD-10-CM | POA: Insufficient documentation

## 2017-07-02 DIAGNOSIS — R109 Unspecified abdominal pain: Secondary | ICD-10-CM | POA: Insufficient documentation

## 2017-07-02 DIAGNOSIS — R110 Nausea: Secondary | ICD-10-CM | POA: Insufficient documentation

## 2017-07-02 DIAGNOSIS — N183 Chronic kidney disease, stage 3 (moderate): Secondary | ICD-10-CM | POA: Insufficient documentation

## 2017-07-02 DIAGNOSIS — F1123 Opioid dependence with withdrawal: Secondary | ICD-10-CM | POA: Insufficient documentation

## 2017-07-02 DIAGNOSIS — I5032 Chronic diastolic (congestive) heart failure: Secondary | ICD-10-CM | POA: Insufficient documentation

## 2017-07-02 DIAGNOSIS — I129 Hypertensive chronic kidney disease with stage 1 through stage 4 chronic kidney disease, or unspecified chronic kidney disease: Secondary | ICD-10-CM | POA: Insufficient documentation

## 2017-07-02 DIAGNOSIS — R059 Cough, unspecified: Secondary | ICD-10-CM

## 2017-07-02 DIAGNOSIS — J3489 Other specified disorders of nose and nasal sinuses: Secondary | ICD-10-CM | POA: Insufficient documentation

## 2017-07-02 DIAGNOSIS — R05 Cough: Secondary | ICD-10-CM | POA: Insufficient documentation

## 2017-07-02 DIAGNOSIS — Z7984 Long term (current) use of oral hypoglycemic drugs: Secondary | ICD-10-CM | POA: Insufficient documentation

## 2017-07-02 DIAGNOSIS — N179 Acute kidney failure, unspecified: Secondary | ICD-10-CM

## 2017-07-02 LAB — CBC WITH DIFFERENTIAL/PLATELET
Basophils Absolute: 0 10*3/uL (ref 0.0–0.1)
Basophils Relative: 0 %
Eosinophils Absolute: 0.2 10*3/uL (ref 0.0–0.7)
Eosinophils Relative: 3 %
HCT: 41.3 % (ref 36.0–46.0)
Hemoglobin: 14.8 g/dL (ref 12.0–15.0)
Lymphocytes Relative: 25 %
Lymphs Abs: 1.5 10*3/uL (ref 0.7–4.0)
MCH: 29.5 pg (ref 26.0–34.0)
MCHC: 35.8 g/dL (ref 30.0–36.0)
MCV: 82.3 fL (ref 78.0–100.0)
Monocytes Absolute: 0.5 10*3/uL (ref 0.1–1.0)
Monocytes Relative: 9 %
Neutro Abs: 3.7 10*3/uL (ref 1.7–7.7)
Neutrophils Relative %: 63 %
Platelets: 124 10*3/uL — ABNORMAL LOW (ref 150–400)
RBC: 5.02 MIL/uL (ref 3.87–5.11)
RDW: 12.9 % (ref 11.5–15.5)
WBC: 5.9 10*3/uL (ref 4.0–10.5)

## 2017-07-02 LAB — URINALYSIS, ROUTINE W REFLEX MICROSCOPIC
Bacteria, UA: NONE SEEN
Bilirubin Urine: NEGATIVE
Glucose, UA: NEGATIVE mg/dL
Hgb urine dipstick: NEGATIVE
Ketones, ur: NEGATIVE mg/dL
Nitrite: NEGATIVE
Protein, ur: >=300 mg/dL — AB
RBC / HPF: NONE SEEN RBC/hpf (ref 0–5)
Specific Gravity, Urine: 1.011 (ref 1.005–1.030)
pH: 6 (ref 5.0–8.0)

## 2017-07-02 LAB — COMPREHENSIVE METABOLIC PANEL
ALT: 20 U/L (ref 14–54)
AST: 23 U/L (ref 15–41)
Albumin: 3.7 g/dL (ref 3.5–5.0)
Alkaline Phosphatase: 70 U/L (ref 38–126)
Anion gap: 8 (ref 5–15)
BUN: 27 mg/dL — ABNORMAL HIGH (ref 6–20)
CO2: 26 mmol/L (ref 22–32)
Calcium: 8.9 mg/dL (ref 8.9–10.3)
Chloride: 104 mmol/L (ref 101–111)
Creatinine, Ser: 2.38 mg/dL — ABNORMAL HIGH (ref 0.44–1.00)
GFR calc Af Amer: 23 mL/min — ABNORMAL LOW (ref >60)
GFR calc non Af Amer: 20 mL/min — ABNORMAL LOW (ref >60)
Glucose, Bld: 174 mg/dL — ABNORMAL HIGH (ref 65–99)
Potassium: 3.7 mmol/L (ref 3.5–5.1)
Sodium: 138 mmol/L (ref 135–145)
Total Bilirubin: 0.3 mg/dL (ref 0.3–1.2)
Total Protein: 7.2 g/dL (ref 6.5–8.1)

## 2017-07-02 LAB — INFLUENZA PANEL BY PCR (TYPE A & B)
Influenza A By PCR: NEGATIVE
Influenza B By PCR: NEGATIVE

## 2017-07-02 LAB — I-STAT TROPONIN, ED: Troponin i, poc: 0 ng/mL (ref 0.00–0.08)

## 2017-07-02 MED ORDER — ONDANSETRON 4 MG PO TBDP: 4.0000 mg | ORAL_TABLET | Freq: Three times a day (TID) | ORAL | 0 refills | Status: DC | PRN

## 2017-07-02 MED ORDER — HYDROXYZINE HCL 25 MG PO TABS: 25.0000 mg | ORAL_TABLET | Freq: Four times a day (QID) | ORAL | 0 refills | Status: DC | PRN

## 2017-07-02 MED ORDER — METHOCARBAMOL 1000 MG/10ML IJ SOLN
1000.0000 mg | Freq: Once | INTRAVENOUS | Status: AC
  Administered 2017-07-02: 1000 mg via INTRAVENOUS
  Filled 2017-07-02: qty 10

## 2017-07-02 MED ORDER — METHOCARBAMOL 500 MG PO TABS: 500.0000 mg | ORAL_TABLET | Freq: Four times a day (QID) | ORAL | 0 refills | Status: DC | PRN

## 2017-07-02 MED ORDER — AMOXICILLIN-POT CLAVULANATE 875-125 MG PO TABS: 1.0000 | ORAL_TABLET | Freq: Two times a day (BID) | ORAL | 0 refills | Status: AC

## 2017-07-02 MED ORDER — SODIUM CHLORIDE 0.9 % IV BOLUS (SEPSIS)
1000.0000 mL | Freq: Once | INTRAVENOUS | Status: AC
  Administered 2017-07-02: 1000 mL via INTRAVENOUS

## 2017-07-02 MED ORDER — ONDANSETRON HCL 4 MG/2ML IJ SOLN
4.0000 mg | Freq: Once | INTRAMUSCULAR | Status: AC
  Administered 2017-07-02: 4 mg via INTRAVENOUS
  Filled 2017-07-02: qty 2

## 2017-07-02 MED ORDER — METHOCARBAMOL 1000 MG/10ML IJ SOLN: 1000.0000 mg | Freq: Once | INTRAMUSCULAR | Status: DC

## 2017-07-02 NOTE — ED Triage Notes (Signed)
Per EMS, patient from home with daughter, c/o abdominal pain, productive cough, and nausea since Monday when she stopped taking tramadol. Reports her doctor discontinued the medication after patient has been taking it long term.

## 2017-07-02 NOTE — ED Notes (Signed)
Bed: WA21 Expected date: 07/02/17 Expected time: 4:34 PM Means of arrival: Ambulance Comments: Near Syncope, light headed

## 2017-07-02 NOTE — ED Provider Notes (Signed)
[facility] DEPT Provider Note   CSN: [id number] Arrival date & time: 07/02/17  1638     History   Chief Complaint Chief Complaint  Patient presents with  . Cough  . Abdominal Pain  . Nausea    HPI Paula [name] is a 71 y.o. female.  HPI   Has been on tramadol for 5 years, tried to come off of it but "body couldn't take it."  Doctor would not refill rx for her, and has not had it since Monday.  Patient reports she tried to wean the medication but had too much pain and did not wean it, and PCP did not fill it.  Reported feeling nervous, anxious, like couldn't sleep and feeling fatigue.  Reports some rectal pain.  Reports some abdominal pain in the front of the belly, feels uncomfortable, thinks it is because of the lack of tramadol.  Reports history of chronic rhinitis, history of chronic pain from breaking bones after remote fall.  Reports nausea, abdominal pain, sensation that needs to have frequent bowel movements but not having diarrhea.    Reports now she is having increased rhinorrhea, and now with post-nasal drainage and coughing.  Now coughing up clear phlegm.  When coughing, feels some shortness of breath, uses inhaler-2 weeks ago was given inhaler.  No chest pain.     Past Medical History:  Diagnosis Date  . Allergic rhinitis   . Arthritis   . Cough   . Diabetes mellitus   . Diastolic CHF, chronic ([place]) 10/11/2011  . GERD (gastroesophageal reflux disease)   . History of colon polyps 2012   tubular adenoma   . Hyperlipidemia   . Hypertension   . Neuropathy 10/11/2011  . PONV (postoperative nausea and vomiting)    trouble waking up    Patient Active Problem List   Diagnosis Date Noted  . Chronic pain 05/07/2017  . Palpitations 07/10/2016  . Insomnia 03/25/2016  . Gout 01/23/2016  . History of cataract 02/27/2015  . Seasonal and perennial allergic rhinitis 02/15/2012  . Cough 11/04/2011  . Diabetic neuropathy ([place])  10/11/2011  . Diastolic CHF, chronic ([place]) 10/11/2011  . CKD (chronic kidney disease) stage 3, GFR 30-59 ml/min (HCC) 10/10/2011  . Type 2 diabetes mellitus ([place]) 07/03/2010  . Hyperlipidemia 07/03/2010  . Essential hypertension 07/03/2010    Past Surgical History:  Procedure Laterality Date  . CATARACT EXTRACTION     right eye  . lymphatic mass surgery    . NASAL TURBINATE REDUCTION      OB History    Gravida Para Term Preterm AB Living   _0 SAB TAB Ectopic Multiple Live Births     1             Home Medications    Prior to Admission medications   Medication Sig Start Date End Date Taking? Authorizing Provider  acyclovir (ZOVIRAX) 400 MG tablet Take 1 tablet (400 mg total) by mouth 3 (three) times daily. 05/07/17   Arnoldo Morale, MD  albuterol (PROVENTIL HFA;VENTOLIN HFA) 108 (90 Base) MCG/ACT inhaler Inhale 2 puffs into the lungs every 6 (six) hours as needed. Shortness of breath 06/27/16   Arnoldo Morale, MD  amLODipine (NORVASC) 10 MG tablet Take 1 tablet (10 mg total) by mouth daily with breakfast. 05/07/17   Arnoldo Morale, MD  amoxicillin-clavulanate (AUGMENTIN) 875-125 MG tablet Take 1 tablet by mouth every 12 (twelve) hours for 7 days. 07/02/17 07/09/17  Gareth Morgan, MD  aspirin 81 MG tablet Take 81 mg by mouth daily.      [provider]  Blood Glucose Monitoring Suppl (TRUERESULT BLOOD GLUCOSE) w/Device KIT USE AS DIRECTED TO CHECK BLOOD SUGAR. 12/18/15   Tresa Garter, MD  calcium carbonate (OSCAL) 1500 (600 Ca) MG TABS tablet Take 1,500 mg by mouth daily with breakfast.    [provider]  cetirizine (ZYRTEC) 10 MG tablet Take 1 tablet (10 mg total) by mouth daily. 05/07/17   Arnoldo Morale, MD  clotrimazole (LOTRIMIN) 1 % cream Apply 1 application topically 2 (two) times daily. 05/07/17   Arnoldo Morale, MD  glipiZIDE (GLUCOTROL XL) 10 MG 24 hr tablet Take 1 tablet (10 mg total) by mouth daily with breakfast. 05/07/17   Arnoldo Morale,  MD  glucose blood (TRUETEST TEST) test strip USE TO CHECK BLOOD AS DIRECTED ONCE DAILY 06/27/16   Arnoldo Morale, MD  glucose blood test strip Use as instructed 07/04/15   Lance Bosch, NP  hydrOXYzine (ATARAX/VISTARIL) 25 MG tablet Take 1-2 tablets (25-50 mg total) by mouth every 6 (six) hours as needed for anxiety or nausea. 07/02/17   Gareth Morgan, MD  Lancets (FREESTYLE) lancets Use as instructed 12/13/14   Lance Bosch, NP  methocarbamol (ROBAXIN) 500 MG tablet Take 1-2 tablets (500-1,000 mg total) by mouth every 6 (six) hours as needed for muscle spasms. 07/02/17   Gareth Morgan, MD  mometasone (NASONEX) 50 MCG/ACT nasal spray Place 2 sprays into the nose daily. Patient not taking: Reported on 06/27/2016 01/23/16   Arnoldo Morale, MD  ondansetron (ZOFRAN ODT) 4 MG disintegrating tablet Take 1 tablet (4 mg total) by mouth every 8 (eight) hours as needed for nausea or vomiting. 07/02/17   Gareth Morgan, MD  Polyethyl Glycol-Propyl Glycol (SYSTANE) 0.4-0.3 % SOLN Place 1 drop into both eyes 2 (two) times daily.    [provider]  rosuvastatin (CRESTOR) 5 MG tablet Take 1 tablet (5 mg total) by mouth daily. 05/07/17   Arnoldo Morale, MD  tiZANidine (ZANAFLEX) 4 MG tablet Take 1 tablet (4 mg total) by mouth every 12 (twelve) hours as needed for muscle spasms. 06/30/17   Arnoldo Morale, MD  traMADol (ULTRAM) 50 MG tablet TAKE 1 TABLET BY MOUTH EVERY 12 HOURS AS NEEDED FOR MODERATE PAIN 05/04/17   Arnoldo Morale, MD  verapamil (CALAN) 80 MG tablet Take 1 tablet (80 mg total) by mouth 2 (two) times daily. 05/07/17   Arnoldo Morale, MD    Family History Family History  Problem Relation Age of Onset  . Hypertension Mother   . Asthma Sister   . Colon cancer Neg Hx     Social History Social History   Tobacco Use  . Smoking status: Never Smoker  . Smokeless tobacco: Never Used  Substance Use Topics  . Alcohol use: No  . Drug use: No     Allergies   Dust mite extract; Ace  inhibitors; and Lovastatin   Review of Systems Review of Systems  Constitutional: Positive for chills, fatigue and fever (subjective). Negative for appetite change.  HENT: Positive for congestion, ear pain, postnasal drip and rhinorrhea.   Eyes: Negative for visual disturbance.  Respiratory: Positive for cough and shortness of breath.   Cardiovascular: Negative for chest pain.  Gastrointestinal: Positive for abdominal pain, nausea and rectal pain. Negative for constipation and vomiting. Diarrhea: frequent but not diarrhea.  Genitourinary: Negative for difficulty urinating and dysuria.  Musculoskeletal: Positive for back pain and  neck pain.  Skin: Negative for rash.  Neurological: Positive for light-headedness.  Psychiatric/Behavioral: The patient is nervous/anxious.      Physical Exam Updated Vital Signs BP (!) 146/79   Pulse 80   Temp 98.8 F (37.1 C) (Oral)   Resp 18   LMP  (LMP Unknown)   SpO2 100%   Physical Exam  Constitutional: She is oriented to person, place, and time. She appears well-developed and well-nourished. No distress.  HENT:  Head: Normocephalic and atraumatic.  Right Ear: Tympanic membrane is bulging.  Left Ear: Tympanic membrane is bulging.  Eyes: Conjunctivae and EOM are normal.  Neck: Normal range of motion.  Cardiovascular: Normal rate, regular rhythm, normal heart sounds and intact distal pulses. Exam reveals no gallop and no friction rub.  No murmur heard. Pulmonary/Chest: Effort normal and breath sounds normal. No respiratory distress. She has no wheezes. She has no rales.  Abdominal: Soft. She exhibits no distension. There is no tenderness. There is no guarding.  Musculoskeletal: She exhibits no edema or tenderness.  Neurological: She is alert and oriented to person, place, and time.  Skin: Skin is warm and dry. No rash noted. She is not diaphoretic. No erythema.  Nursing note and vitals reviewed.    ED Treatments / Results  Labs (all labs  ordered are listed, but only abnormal results are displayed) Labs Reviewed  COMPREHENSIVE METABOLIC PANEL - Abnormal; Notable for the following components:      Result Value   Glucose, Bld 174 (*)    BUN 27 (*)    Creatinine, Ser 2.38 (*)    GFR calc non Af Amer 20 (*)    GFR calc Af Amer 23 (*)    All other components within normal limits  URINALYSIS, ROUTINE W REFLEX MICROSCOPIC - Abnormal; Notable for the following components:   Protein, ur >=300 (*)    Leukocytes, UA SMALL (*)    Squamous Epithelial / LPF 0-5 (*)    All other components within normal limits  CBC WITH DIFFERENTIAL/PLATELET - Abnormal; Notable for the following components:   Platelets 124 (*)    All other components within normal limits  URINE CULTURE  INFLUENZA PANEL BY PCR (TYPE A & B)  CBC WITH DIFFERENTIAL/PLATELET  I-STAT TROPONIN, ED    EKG  EKG Interpretation  Date/Time:  Thursday July 02 2017 16:52:01 EST Ventricular Rate:  85 PR Interval:    QRS Duration: 79 QT Interval:  443 QTC Calculation: 527 R Axis:   -14 Text Interpretation:  Sinus rhythm Borderline T wave abnormalities Prolonged QT interval No significant change since last tracing Confirmed by Gareth Morgan [phone]) on 07/02/2017 4:56:31 PM       Radiology Dg Chest 2 View  Result Date: 07/02/2017 CLINICAL DATA:  Chest tightness, nausea, vomiting and dyspnea. EXAM: CHEST  2 VIEW COMPARISON:  03/25/2017 FINDINGS: The heart size and mediastinal contours are within normal limits. Both lungs are clear. The visualized skeletal structures are unremarkable. IMPRESSION: No active cardiopulmonary disease. Electronically Signed   By: Ashley Royalty M.D.   On: 07/02/2017 17:44    Procedures Procedures (including critical care time)  Medications Ordered in ED Medications  sodium chloride 0.9 % bolus 1,000 mL (0 mLs Intravenous Stopped 07/02/17 1927)  ondansetron (ZOFRAN) injection 4 mg (4 mg Intravenous Given 07/02/17 1753)  methocarbamol  (ROBAXIN) 1,000 mg in dextrose 5 % 50 mL IVPB (0 mg Intravenous Stopped 07/02/17 1833)     Initial Impression / Assessment and Plan / ED  Course  I have reviewed the triage vital signs and the nursing notes.  Pertinent labs & imaging results that were available during my care of the patient were reviewed by me and considered in my medical decision making (see chart for details).     71 year old female with hx of DM, htn, hlpd, chronic pain, presents with concern for cough, congestion, nausea, abdominal pain in the setting of her physician stopping her tramadol on Monday.  X-ray completed shows no sign of pneumonia or congestive heart failure.  Patient also reports a sensation of palpitations, with EKG showing no significant abnormalities, no abnormalities on cardiac monitoring.  Abdominal exam is benign, have low suspicion for appendicitis, cholecystitis, acute obstruction, diverticulitis. Hx not consistent with mesenteric ischemia.  Flu testing completed and negative. Troponin completed given nausea, hx of DM, show no sign of cardiac ischemia.    Symptoms may be due to viral syndrome or likely tramadol withdrawal given duration of time taking the medication. Given IV fluids, robaxin and zofran in ED.  Discussed we cannot refill her tramadol rx, however will give rx for zofran, robaxin, hydroxazine to help with side effects of symptoms. In addition, bilateral TM concerning for otitis media and will give rx for augmentin.    Final Clinical Impressions(s) / ED Diagnoses   Final diagnoses:  Cough  Rhinorrhea  Narcotic withdrawal (HCC)  Nausea  Abdominal pain, unspecified abdominal location  Acute kidney injury [facility])    ED Discharge Orders        Ordered    ondansetron (ZOFRAN ODT) 4 MG disintegrating tablet  Every 8 hours PRN     07/02/17 2021    methocarbamol (ROBAXIN) 500 MG tablet  Every 6 hours PRN     07/02/17 2021    hydrOXYzine (ATARAX/VISTARIL) 25 MG tablet  Every 6 hours PRN      07/02/17 2021    amoxicillin-clavulanate (AUGMENTIN) 875-125 MG tablet  Every 12 hours     07/02/17 2028       Gareth Morgan, MD 07/02/17 2322

## 2017-07-02 NOTE — ED Notes (Signed)
Patient transported to X-ray 

## 2017-07-04 LAB — URINE CULTURE

## 2017-07-06 NOTE — Telephone Encounter (Signed)
She does have chronic kidney disease which is being managed by [facility].  She could be placed on any available provider's schedule.

## 2017-07-06 NOTE — Telephone Encounter (Signed)
Pt. Called stating she went to the ED and was told to fullow up with her PCP ASAP. Pt. States that she was told that something is wrong with her Kidney's. Pt. Also states that her PCP took her off Tramadol and thinks that is why she also ended in the ED. She was told that her PCP was booked up and put her in with [facility] on 07/16/17. Pt. Stated she could not wait that long and requested to speak with PCP. Please f/u

## 2017-07-07 NOTE — Telephone Encounter (Signed)
Pt has appointment Thursday at 9

## 2017-07-08 MED FILL — ROSUVASTATIN CALCIUM 5 MG T: 5 | 30 days supply | Qty: 30 | Fill #3

## 2017-07-08 NOTE — Telephone Encounter (Signed)
Noted  

## 2017-07-09 ENCOUNTER — Encounter: Payer: Self-pay | Admitting: Family Medicine

## 2017-07-09 ENCOUNTER — Ambulatory Visit: Payer: Self-pay | Attending: Family Medicine | Admitting: Family Medicine

## 2017-07-09 VITALS — BP 131/72 | HR 79 | Temp 97.8°F | Ht 63.0 in | Wt 152.8 lb

## 2017-07-09 DIAGNOSIS — E1100 Type 2 diabetes mellitus with hyperosmolarity without nonketotic hyperglycemic-hyperosmolar coma (NKHHC): Secondary | ICD-10-CM

## 2017-07-09 DIAGNOSIS — Z7982 Long term (current) use of aspirin: Secondary | ICD-10-CM | POA: Insufficient documentation

## 2017-07-09 DIAGNOSIS — F1193 Opioid use, unspecified with withdrawal: Secondary | ICD-10-CM

## 2017-07-09 DIAGNOSIS — E1165 Type 2 diabetes mellitus with hyperglycemia: Secondary | ICD-10-CM | POA: Insufficient documentation

## 2017-07-09 DIAGNOSIS — Z86010 Personal history of colonic polyps: Secondary | ICD-10-CM | POA: Insufficient documentation

## 2017-07-09 DIAGNOSIS — E1122 Type 2 diabetes mellitus with diabetic chronic kidney disease: Secondary | ICD-10-CM | POA: Insufficient documentation

## 2017-07-09 DIAGNOSIS — N183 Chronic kidney disease, stage 3 unspecified: Secondary | ICD-10-CM

## 2017-07-09 DIAGNOSIS — F1123 Opioid dependence with withdrawal: Secondary | ICD-10-CM | POA: Insufficient documentation

## 2017-07-09 DIAGNOSIS — E860 Dehydration: Secondary | ICD-10-CM | POA: Insufficient documentation

## 2017-07-09 DIAGNOSIS — K219 Gastro-esophageal reflux disease without esophagitis: Secondary | ICD-10-CM | POA: Insufficient documentation

## 2017-07-09 DIAGNOSIS — I130 Hypertensive heart and chronic kidney disease with heart failure and stage 1 through stage 4 chronic kidney disease, or unspecified chronic kidney disease: Secondary | ICD-10-CM | POA: Insufficient documentation

## 2017-07-09 DIAGNOSIS — Z7984 Long term (current) use of oral hypoglycemic drugs: Secondary | ICD-10-CM | POA: Insufficient documentation

## 2017-07-09 DIAGNOSIS — E7800 Pure hypercholesterolemia, unspecified: Secondary | ICD-10-CM | POA: Insufficient documentation

## 2017-07-09 DIAGNOSIS — Z79899 Other long term (current) drug therapy: Secondary | ICD-10-CM | POA: Insufficient documentation

## 2017-07-09 LAB — GLUCOSE, POCT (MANUAL RESULT ENTRY): POC Glucose: 277 mg/dl — AB (ref 70–99)

## 2017-07-09 MED ORDER — GLUCOSE BLOOD VI STRP: ORAL_STRIP | 12 refills | Status: DC

## 2017-07-09 MED ORDER — CLONIDINE HCL 0.1 MG PO TABS: 0.1000 mg | ORAL_TABLET | Freq: Two times a day (BID) | ORAL | 0 refills | Status: DC

## 2017-07-09 MED FILL — AMOX-CLAV 875-125 MG TABLET: 875-125 | 7 days supply | Qty: 14 | Fill #0

## 2017-07-09 MED FILL — TRUE METRIX TEST STRIP: 30 days supply | Qty: 100 | Fill #0 | Status: TO

## 2017-07-09 MED FILL — ?CLONIDINE HCL 0.1 MG TABL: 0.1 | 15 days supply | Qty: 30 | Fill #0

## 2017-07-09 NOTE — Progress Notes (Signed)
Subjective:  Patient ID: Paula [name], female    DOB: Feb 25, 1946  Age: 72 y.o. MRN: [id number]  CC: Hospitalization Follow-up   HPI Paula [name]  is a 71 year old female with a history of type 2 diabetes mellitus (A1c 8.0 ), hypertension, hypercholesterolemia, allergic rhinitis, stage III CKD (managed by Dr [facility]) who comes into the clinic for a follow-up visit.  She was seen at the ED 1 week ago due to symptoms of tramadol withdrawal versus viral syndrome and was treated with IV fluids, Robaxin, Zofran and Augmentin prescribed for examination findings concerning for otitis media. We had discussed tapering instructions for tramadol at her visit on 05/07/17 after we had found no indication to remain on tramadol which she states she had taken for 5 years; refill had been denied as we had discussed discontinuation. During her ED visit labs had revealed trending up of her creatinine from a baseline of 1.5-2.38.  Today she informs me she no longer has rhinorrhea but still feels nervous, weak and dizzy; states she thought 'she was going to die'.   Denies body aches or joint pain and has no diarrhea.  She would like to have information about completing a Will. She has been to see her nephrologist-Dr. [name] for follow-up of her chronic kidney disease and had labs done yesterday.  With regards to her blood sugars her fasting sugars have been in the 124-134 range.  Past Medical History:  Diagnosis Date  . Allergic rhinitis   . Arthritis   . Cough   . Diabetes mellitus   . Diastolic CHF, chronic ([place]) 10/11/2011  . GERD (gastroesophageal reflux disease)   . History of colon polyps 2012   tubular adenoma   . Hyperlipidemia   . Hypertension   . Neuropathy 10/11/2011  . PONV (postoperative nausea and vomiting)    trouble waking up    Past Surgical History:  Procedure Laterality Date  . CATARACT EXTRACTION     right eye  . lymphatic mass surgery    . NASAL TURBINATE  REDUCTION      Allergies  Allergen Reactions  . Dust Mite Extract Cough  . Ace Inhibitors Cough    chronic cough  . Lovastatin Other (See Comments)    Generalized body pain     Outpatient Medications Prior to Visit  Medication Sig Dispense Refill  . amLODipine (NORVASC) 10 MG tablet Take 1 tablet (10 mg total) by mouth daily with breakfast. 90 tablet 1  . glipiZIDE (GLUCOTROL XL) 10 MG 24 hr tablet Take 1 tablet (10 mg total) by mouth daily with breakfast. 90 tablet 1  . rosuvastatin (CRESTOR) 5 MG tablet Take 1 tablet (5 mg total) by mouth daily. 90 tablet 1  . verapamil (CALAN) 80 MG tablet Take 1 tablet (80 mg total) by mouth 2 (two) times daily. 180 tablet 1  . acyclovir (ZOVIRAX) 400 MG tablet Take 1 tablet (400 mg total) by mouth 3 (three) times daily. (Patient not taking: Reported on 07/09/2017) 10 tablet 0  . albuterol (PROVENTIL HFA;VENTOLIN HFA) 108 (90 Base) MCG/ACT inhaler Inhale 2 puffs into the lungs every 6 (six) hours as needed. Shortness of breath (Patient not taking: Reported on 07/09/2017) 18 g 2  . amoxicillin-clavulanate (AUGMENTIN) 875-125 MG tablet Take 1 tablet by mouth every 12 (twelve) hours for 7 days. (Patient not taking: Reported on 07/09/2017) 14 tablet 0  . aspirin 81 MG tablet Take 81 mg by mouth daily.      Marland Kitchen  Blood Glucose Monitoring Suppl (TRUERESULT BLOOD GLUCOSE) w/Device KIT USE AS DIRECTED TO CHECK BLOOD SUGAR. (Patient not taking: Reported on 07/09/2017) 1 each 0  . calcium carbonate (OSCAL) 1500 (600 Ca) MG TABS tablet Take 1,500 mg by mouth daily with breakfast.    . cetirizine (ZYRTEC) 10 MG tablet Take 1 tablet (10 mg total) by mouth daily. (Patient not taking: Reported on 07/09/2017) 90 tablet 1  . clotrimazole (LOTRIMIN) 1 % cream Apply 1 application topically 2 (two) times daily. (Patient not taking: Reported on 07/09/2017) 30 g 0  . glucose blood (TRUETEST TEST) test strip USE TO CHECK BLOOD AS DIRECTED ONCE DAILY (Patient not taking: Reported  on 07/09/2017) 100 each 1  . hydrOXYzine (ATARAX/VISTARIL) 25 MG tablet Take 1-2 tablets (25-50 mg total) by mouth every 6 (six) hours as needed for anxiety or nausea. (Patient not taking: Reported on 07/09/2017) 12 tablet 0  . Lancets (FREESTYLE) lancets Use as instructed (Patient not taking: Reported on 07/09/2017) 100 each 12  . mometasone (NASONEX) 50 MCG/ACT nasal spray Place 2 sprays into the nose daily. (Patient not taking: Reported on 06/27/2016) 15 g 2  . ondansetron (ZOFRAN ODT) 4 MG disintegrating tablet Take 1 tablet (4 mg total) by mouth every 8 (eight) hours as needed for nausea or vomiting. (Patient not taking: Reported on 07/09/2017) 20 tablet 0  . Polyethyl Glycol-Propyl Glycol (SYSTANE) 0.4-0.3 % SOLN Place 1 drop into both eyes 2 (two) times daily.    Marland Kitchen tiZANidine (ZANAFLEX) 4 MG tablet Take 1 tablet (4 mg total) by mouth every 12 (twelve) hours as needed for muscle spasms. (Patient not taking: Reported on 07/09/2017) 60 tablet 1  . traMADol (ULTRAM) 50 MG tablet TAKE 1 TABLET BY MOUTH EVERY 12 HOURS AS NEEDED FOR MODERATE PAIN (Patient not taking: Reported on 07/09/2017) 60 tablet 1  . glucose blood test strip Use as instructed (Patient not taking: Reported on 07/09/2017) 100 each 12  . methocarbamol (ROBAXIN) 500 MG tablet Take 1-2 tablets (500-1,000 mg total) by mouth every 6 (six) hours as needed for muscle spasms. (Patient not taking: Reported on 07/09/2017) 20 tablet 0   No facility-administered medications prior to visit.     ROS Review of Systems General: negative for fever, weight loss, appetite change, positive for fatigue Eyes: no visual symptoms. ENT: no ear symptoms, no sinus tenderness, no nasal congestion or sore throat. Neck: no pain  Respiratory: no wheezing, shortness of breath, cough Cardiovascular: no chest pain, no dyspnea on exertion, no pedal edema, no orthopnea. Gastrointestinal: no abdominal pain, no diarrhea, no constipation Genito-Urinary: no  urinary frequency, no dysuria, no polyuria. Hematologic: no bruising Endocrine: no cold or heat intolerance Neurological: no headaches, no seizures, positive for restlessness, lightheadedness Musculoskeletal: no joint pains, no joint swelling Skin: no pruritus, no rash. Psychological: no depression, no anxiety,    Objective:  BP 131/72   Pulse 79   Temp 97.8 F (36.6 C) (Oral)   Ht _0  (1.6 m)   Wt 152 lb 12.8 oz (69.3 kg)   LMP  (LMP Unknown)   SpO2 98%   BMI 27.07 kg/m   BP/Weight 07/09/2017 07/02/2017 7/56/4332  Systolic BP 951 884 166  Diastolic BP 72 79 70  Wt. (Lbs) 152.8 - 156.2  BMI 27.07 - 27.67      Physical Exam Constitutional: normal appearing,  Eyes: PERRLA HEENT: Head is atraumatic, normal sinuses, normal oropharynx, normal appearing tonsils and palate, tympanic membrane is normal bilaterally. Neck: normal range of  motion, no thyromegaly, no JVD Cardiovascular: normal rate and rhythm, normal heart sounds, no murmurs, rub or gallop, no pedal edema Respiratory: clear to auscultation bilaterally, no wheezes, no rales, no rhonchi Abdomen: soft, not tender to palpation, normal bowel sounds, no enlarged organs Extremities: Full ROM, no tenderness in joints Skin: warm and dry, no lesions. Neurological: alert, oriented x3, cranial nerves I-XII grossly intact , normal motor strength, normal sensation. Psychological: normal mood.    Lab Results  Component Value Date   HGBA1C 8.0 05/07/2017    Assessment & Plan:   1. Type 2 diabetes mellitus with hyperosmolarity without coma, without long-term current use of insulin (HCC) A1c was 8.0 in 04/2017 Currently on glipizide 10 mg and fasting blood sugars in the 120-130 range If A1c trends up I will increase her dose of glipizide Compliant with diabetic diet - POCT glucose (manual entry) - glucose blood (TRUE METRIX BLOOD GLUCOSE TEST) test strip; Use 3 times daily before meals  Dispense: 100 each; Refill:  12  2. Opioid withdrawal ([place]) Symptoms have improved -she has residual restlessness, fatigue Spoke with the pharmacy who will fill her Flexeril today I have discussed with her in the event of body aches we might have to consider nonnarcotic treatment but at this time she is pain-free so this is not indicated - cloNIDine (CATAPRES) 0.1 MG tablet; Take 1 tablet (0.1 mg total) by mouth 2 (two) times daily. For opioid withdrawal  Dispense: 30 tablet; Refill: 0 Information has been provided regarding proxy, power of attorney and creation of a will.  3. CKD (chronic kidney disease) stage 3, GFR 30-59 ml/min (HCC) Creatinine trended up from a baseline of 1.5- 2.38 due to dehydration and symptoms of opiate withdrawal She was seen by her nephrologist (Dr [name]) yesterday and had labs. We will hold off on repeating labs today.   Meds ordered this encounter  Medications  . cloNIDine (CATAPRES) 0.1 MG tablet    Sig: Take 1 tablet (0.1 mg total) by mouth 2 (two) times daily. For opioid withdrawal    Dispense:  30 tablet    Refill:  0  . glucose blood (TRUE METRIX BLOOD GLUCOSE TEST) test strip    Sig: Use 3 times daily before meals    Dispense:  100 each    Refill:  12    Follow-up: Return in about 1 month (around 08/08/2017) for follow up on diabetes mellitus.   Arnoldo Morale MD

## 2017-07-13 ENCOUNTER — Telehealth: Payer: Self-pay | Admitting: Family Medicine

## 2017-07-13 MED ORDER — ISOSORBIDE MONONITRATE ER 60 MG PO TB24: 60.0000 mg | ORAL_TABLET | Freq: Every day | ORAL | 3 refills | Status: DC

## 2017-07-13 NOTE — Telephone Encounter (Signed)
Please advise her to speak with her pharmacy regarding her concern as she would have received a notice if the lot number of the medication she was taking was on the recall list. Nevertheless I have changed her medication as per request.

## 2017-07-13 NOTE — Telephone Encounter (Signed)
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

## 2017-07-14 ENCOUNTER — Ambulatory Visit (INDEPENDENT_AMBULATORY_CARE_PROVIDER_SITE_OTHER): Payer: Self-pay | Admitting: Emergency Medicine

## 2017-07-14 ENCOUNTER — Encounter: Payer: Self-pay | Admitting: Emergency Medicine

## 2017-07-14 VITALS — BP 142/80 | HR 76 | Ht 63.0 in | Wt 155.4 lb

## 2017-07-14 DIAGNOSIS — R053 Chronic cough: Secondary | ICD-10-CM

## 2017-07-14 DIAGNOSIS — R05 Cough: Secondary | ICD-10-CM

## 2017-07-14 MED ORDER — MOMETASONE FUROATE 50 MCG/ACT NA SUSP: 2.0000 | Freq: Every day | NASAL | 5 refills | Status: DC

## 2017-07-14 MED ORDER — PANTOPRAZOLE SODIUM 40 MG PO TBEC: 40.0000 mg | DELAYED_RELEASE_TABLET | Freq: Every day | ORAL | 5 refills | Status: DC

## 2017-07-14 NOTE — Progress Notes (Signed)
Subjective:    Patient ID: Paula [name], female    DOB: [birth year], 71 y.o.   MRN: [id number]  HPI 71 year old never smoker with a history of diabetes, hypertension with diastolic dysfunction, and chronic cough previously followed in our office by Dr. Joya Gaskins.  She has a history of allergic rhinitis and GERD and it was felt that these were the most significant contributor to her coughing on her past evaluations.  She returns today describing an increase nasal drainage, cough that started about 2.5 weeks ago when she came off tramadol. She does not want to restart it. She is no longer on nasonex, was on an alternative nasal steroid that she stopped a month ago. She is no longer on a PPI.  Her cough is productive of clear mucous, she has a lot of nasal congestion.    Review of Systems  HENT: Positive for congestion, postnasal drip, sinus pressure, sneezing and sore throat.   Respiratory: Positive for cough, chest tightness, shortness of breath and wheezing.   Cardiovascular: Positive for palpitations.  Skin: Negative for rash.  Allergic/Immunologic: Negative.   Psychiatric/Behavioral: The patient is nervous/anxious.     Past Medical History:  Diagnosis Date  . Allergic rhinitis   . Arthritis   . Cough   . Diabetes mellitus   . Diastolic CHF, chronic ([place]) 10/11/2011  . GERD (gastroesophageal reflux disease)   . History of colon polyps 2012   tubular adenoma   . Hyperlipidemia   . Hypertension   . Neuropathy 10/11/2011  . PONV (postoperative nausea and vomiting)    trouble waking up     Family History  Problem Relation Age of Onset  . Hypertension Mother   . Asthma Sister   . Colon cancer Neg Hx      Social History   Socioeconomic History  . Marital status: Widowed    Spouse name: Not on file  . Number of children: 4  . Years of education: Not on file  . Highest education level: Not on file  Social Needs  . Financial resource strain: Not on file  . Food insecurity  - worry: Not on file  . Food insecurity - inability: Not on file  . Transportation needs - medical: Not on file  . Transportation needs - non-medical: Not on file  Occupational History  . Occupation: Unemployed  Tobacco Use  . Smoking status: Never Smoker  . Smokeless tobacco: Never Used  Substance and Sexual Activity  . Alcohol use: No  . Drug use: No  . Sexual activity: Not Currently    Birth control/protection: None  Other Topics Concern  . Not on file  Social History Narrative  . Not on file  Originally from [name], has lived [place], [place], [place] Has worked as Education officer, museum   Allergies  Allergen Reactions  . Dust Mite Extract Cough  . Ace Inhibitors Cough    chronic cough  . Lovastatin Other (See Comments)    Generalized body pain     Outpatient Medications Prior to Visit  Medication Sig Dispense Refill  . albuterol (PROVENTIL HFA;VENTOLIN HFA) 108 (90 Base) MCG/ACT inhaler Inhale 2 puffs into the lungs every 6 (six) hours as needed. Shortness of breath 18 g 2  . aspirin 81 MG tablet Take 81 mg by mouth daily.      . Blood Glucose Monitoring Suppl (TRUERESULT BLOOD GLUCOSE) w/Device KIT USE AS DIRECTED TO CHECK BLOOD SUGAR. 1 each 0  . calcium carbonate (OSCAL) 1500 (600 Ca)  MG TABS tablet Take 1,500 mg by mouth daily with breakfast.    . glipiZIDE (GLUCOTROL XL) 10 MG 24 hr tablet Take 1 tablet (10 mg total) by mouth daily with breakfast. 90 tablet 1  . glucose blood (TRUE METRIX BLOOD GLUCOSE TEST) test strip Use 3 times daily before meals 100 each 12  . glucose blood (TRUETEST TEST) test strip USE TO CHECK BLOOD AS DIRECTED ONCE DAILY 100 each 1  . Lancets (FREESTYLE) lancets Use as instructed 100 each 12  . ondansetron (ZOFRAN ODT) 4 MG disintegrating tablet Take 1 tablet (4 mg total) by mouth every 8 (eight) hours as needed for nausea or vomiting. 20 tablet 0  . Polyethyl Glycol-Propyl Glycol (SYSTANE) 0.4-0.3 % SOLN Place 1 drop into both eyes 2 (two) times daily.    .  rosuvastatin (CRESTOR) 5 MG tablet Take 1 tablet (5 mg total) by mouth daily. 90 tablet 1  . tiZANidine (ZANAFLEX) 4 MG tablet Take 1 tablet (4 mg total) by mouth every 12 (twelve) hours as needed for muscle spasms. 60 tablet 1  . traMADol (ULTRAM) 50 MG tablet TAKE 1 TABLET BY MOUTH EVERY 12 HOURS AS NEEDED FOR MODERATE PAIN 60 tablet 1  . verapamil (CALAN) 80 MG tablet Take 1 tablet (80 mg total) by mouth 2 (two) times daily. 180 tablet 1  . acyclovir (ZOVIRAX) 400 MG tablet Take 1 tablet (400 mg total) by mouth 3 (three) times daily. 10 tablet 0  . cetirizine (ZYRTEC) 10 MG tablet Take 1 tablet (10 mg total) by mouth daily. 90 tablet 1  . mometasone (NASONEX) 50 MCG/ACT nasal spray Place 2 sprays into the nose daily. 15 g 2  . cloNIDine (CATAPRES) 0.1 MG tablet Take 1 tablet (0.1 mg total) by mouth 2 (two) times daily. For opioid withdrawal (Patient not taking: Reported on 07/14/2017) 30 tablet 0  . clotrimazole (LOTRIMIN) 1 % cream Apply 1 application topically 2 (two) times daily. (Patient not taking: Reported on 07/14/2017) 30 g 0  . isosorbide mononitrate (IMDUR) 60 MG 24 hr tablet Take 1 tablet (60 mg total) by mouth daily. (Patient not taking: Reported on 07/14/2017) 30 tablet 3  . hydrOXYzine (ATARAX/VISTARIL) 25 MG tablet Take 1-2 tablets (25-50 mg total) by mouth every 6 (six) hours as needed for anxiety or nausea. (Patient not taking: Reported on 07/14/2017) 12 tablet 0   No facility-administered medications prior to visit.         Objective:   Physical Exam  Vitals:   07/14/17 1459  BP: (!) 142/80  Pulse: 76  SpO2: 98%  Weight: 155 lb 6.4 oz (70.5 kg)  Height: 5' 3"  (1.6 m)   Gen: Pleasant, well-nourished, in no distress,  normal affect  ENT: No lesions,  mouth clear,  oropharynx clear, no postnasal drip but some apparent congestion  Neck: No JVD, no stridor  Lungs: No use of accessory muscles, no wheezing, no crackles  Cardiovascular: RRR, heart sounds normal, no murmur  or gallops, no peripheral edema  Musculoskeletal: No deformities, no cyanosis or clubbing  Neuro: alert, non focal  Skin: Warm, no lesions or rashes  Assessment & Plan:  Cough She has a history of chronic cough that in the past has responded to PPI.  She also describes significant allergic rhinitis and drainage that are almost certainly contributing.  She is not currently taking her Nasonex.  Interestingly the entire syndrome worsened when she was taken off tramadol.  She does not want to have to go back on the tramadol.  I believe we can try restarting her Nasonex, restarting pantoprazole to see if her cough resolves.  If so we can peel these medications off as tolerated.  If her cough does not improve then I believe she needs full pulmonary function testing, chest x-ray, more in-depth workup.  Baltazar Apo, MD, PhD 07/14/2017, 3:41 PM [facility] [phone] or if no answer [phone]

## 2017-07-14 NOTE — Patient Instructions (Addendum)
Please start Nasonex 2 sprays each nostril once a day Please start pantoprazole 40 mg once a day.  Take this medication 30-60 minutes before or after eating.  Depending on your improvement on these medications we may need to do further evaluation including possible pulmonary function testing, chest x-ray.  Follow with Dr Lamonte Sakai next available to assess your status

## 2017-07-14 NOTE — Assessment & Plan Note (Signed)
She has a history of chronic cough that in the past has responded to PPI.  She also describes significant allergic rhinitis and drainage that are almost certainly contributing.  She is not currently taking her Nasonex.  Interestingly the entire syndrome worsened when she was taken off tramadol.  She does not want to have to go back on the tramadol.  I believe we can try restarting her Nasonex, restarting pantoprazole to see if her cough resolves.  If so we can peel these medications off as tolerated.  If her cough does not improve then I believe she needs full pulmonary function testing, chest x-ray, more in-depth workup.

## 2017-07-16 ENCOUNTER — Institutional Professional Consult (permissible substitution): Payer: Self-pay | Admitting: Emergency Medicine

## 2017-07-16 ENCOUNTER — Inpatient Hospital Stay: Payer: Self-pay

## 2017-07-20 ENCOUNTER — Telehealth: Payer: Self-pay | Admitting: Emergency Medicine

## 2017-07-20 MED ORDER — PREDNISONE 10 MG PO TABS: ORAL_TABLET | ORAL | 0 refills | Status: DC

## 2017-07-20 NOTE — Telephone Encounter (Signed)
Spoke with patient. She is having persistent cough, hoarse voice, dyaspnea that feels like she cannot move air through her throat. She is trying allergy and GERD relief as we discussed 12/4.   Reasonable to treat her with pred taper to see if we can reverse UA irritation. I will call in for her - although she is concerned that she will not be able top pick up until 12/11 due to the snow. I advised her to seek emergency care if she worsens in any way - she understands to do so if needed.   pred > Take 40mg  daily for 3 days, then 30mg  daily for 3 days, then 20mg  daily for 3 days, then 10mg  daily for 3 days, then stop

## 2017-07-21 ENCOUNTER — Telehealth: Payer: Self-pay | Admitting: Emergency Medicine

## 2017-07-21 NOTE — Telephone Encounter (Signed)
I was notified by the patient that the prednisone that was sent by [facility] never got to her pharmacy.   I will call in again.   Take 40mg  daily for 3 days, then 30mg  daily for 3 days, then 20mg  daily for 3 days, then 10mg  daily for 3 days, then stop

## 2017-07-26 ENCOUNTER — Other Ambulatory Visit: Payer: Self-pay

## 2017-07-26 ENCOUNTER — Emergency Department (HOSPITAL_COMMUNITY)
Admission: EM | Admit: 2017-07-26 | Discharge: 2017-07-26 | Disposition: A | Discharge status: Home / Self Care | Payer: Self-pay | Attending: Emergency Medicine | Admitting: Emergency Medicine

## 2017-07-26 DIAGNOSIS — I5032 Chronic diastolic (congestive) heart failure: Secondary | ICD-10-CM | POA: Insufficient documentation

## 2017-07-26 DIAGNOSIS — N183 Chronic kidney disease, stage 3 (moderate): Secondary | ICD-10-CM | POA: Insufficient documentation

## 2017-07-26 DIAGNOSIS — E1140 Type 2 diabetes mellitus with diabetic neuropathy, unspecified: Secondary | ICD-10-CM | POA: Insufficient documentation

## 2017-07-26 DIAGNOSIS — Z79899 Other long term (current) drug therapy: Secondary | ICD-10-CM | POA: Insufficient documentation

## 2017-07-26 DIAGNOSIS — Z7982 Long term (current) use of aspirin: Secondary | ICD-10-CM | POA: Insufficient documentation

## 2017-07-26 DIAGNOSIS — R739 Hyperglycemia, unspecified: Secondary | ICD-10-CM | POA: Insufficient documentation

## 2017-07-26 DIAGNOSIS — Z7984 Long term (current) use of oral hypoglycemic drugs: Secondary | ICD-10-CM | POA: Insufficient documentation

## 2017-07-26 DIAGNOSIS — I130 Hypertensive heart and chronic kidney disease with heart failure and stage 1 through stage 4 chronic kidney disease, or unspecified chronic kidney disease: Secondary | ICD-10-CM | POA: Insufficient documentation

## 2017-07-26 DIAGNOSIS — E1122 Type 2 diabetes mellitus with diabetic chronic kidney disease: Secondary | ICD-10-CM | POA: Insufficient documentation

## 2017-07-26 LAB — URINALYSIS, ROUTINE W REFLEX MICROSCOPIC
Bilirubin Urine: NEGATIVE
Glucose, UA: 50 mg/dL — AB
Hgb urine dipstick: NEGATIVE
Ketones, ur: NEGATIVE mg/dL
Leukocytes, UA: NEGATIVE
Nitrite: NEGATIVE
Protein, ur: 100 mg/dL — AB
RBC / HPF: NONE SEEN RBC/hpf (ref 0–5)
Specific Gravity, Urine: 1.006 (ref 1.005–1.030)
WBC, UA: NONE SEEN WBC/hpf (ref 0–5)
pH: 7 (ref 5.0–8.0)

## 2017-07-26 LAB — COMPREHENSIVE METABOLIC PANEL
ALT: 34 U/L (ref 14–54)
AST: 30 U/L (ref 15–41)
Albumin: 3.7 g/dL (ref 3.5–5.0)
Alkaline Phosphatase: 73 U/L (ref 38–126)
Anion gap: 9 (ref 5–15)
BUN: 30 mg/dL — ABNORMAL HIGH (ref 6–20)
CO2: 28 mmol/L (ref 22–32)
Calcium: 9 mg/dL (ref 8.9–10.3)
Chloride: 98 mmol/L — ABNORMAL LOW (ref 101–111)
Creatinine, Ser: 1.69 mg/dL — ABNORMAL HIGH (ref 0.44–1.00)
GFR calc Af Amer: 34 mL/min — ABNORMAL LOW (ref >60)
GFR calc non Af Amer: 30 mL/min — ABNORMAL LOW (ref >60)
Glucose, Bld: 354 mg/dL — ABNORMAL HIGH (ref 65–99)
Potassium: 4 mmol/L (ref 3.5–5.1)
Sodium: 135 mmol/L (ref 135–145)
Total Bilirubin: 0.5 mg/dL (ref 0.3–1.2)
Total Protein: 7.3 g/dL (ref 6.5–8.1)

## 2017-07-26 LAB — CBC WITH DIFFERENTIAL/PLATELET
Basophils Absolute: 0 10*3/uL (ref 0.0–0.1)
Basophils Relative: 0 %
Eosinophils Absolute: 0 10*3/uL (ref 0.0–0.7)
Eosinophils Relative: 0 %
HCT: 36.9 % (ref 36.0–46.0)
Hemoglobin: 13.3 g/dL (ref 12.0–15.0)
Lymphocytes Relative: 9 %
Lymphs Abs: 0.8 10*3/uL (ref 0.7–4.0)
MCH: 29.2 pg (ref 26.0–34.0)
MCHC: 36 g/dL (ref 30.0–36.0)
MCV: 81.1 fL (ref 78.0–100.0)
Monocytes Absolute: 0.1 10*3/uL (ref 0.1–1.0)
Monocytes Relative: 2 %
Neutro Abs: 7.1 10*3/uL (ref 1.7–7.7)
Neutrophils Relative %: 89 %
Platelets: 273 10*3/uL (ref 150–400)
RBC: 4.55 MIL/uL (ref 3.87–5.11)
RDW: 13.3 % (ref 11.5–15.5)
WBC: 8 10*3/uL (ref 4.0–10.5)

## 2017-07-26 LAB — CBG MONITORING, ED
Glucose-Capillary: 331 mg/dL — ABNORMAL HIGH (ref 65–99)
Glucose-Capillary: 332 mg/dL — ABNORMAL HIGH (ref 65–99)
Glucose-Capillary: 288 mg/dL — ABNORMAL HIGH (ref 65–99)
Glucose-Capillary: 293 mg/dL — ABNORMAL HIGH (ref 65–99)

## 2017-07-26 MED ORDER — GLIPIZIDE 5 MG PO TABS: 5.0000 mg | ORAL_TABLET | Freq: Every day | ORAL | 0 refills | Status: DC

## 2017-07-26 NOTE — ED Notes (Signed)
Dr.Zammit at bedside to speak with pt.

## 2017-07-26 NOTE — ED Provider Notes (Signed)
[facility] DEPT Provider Note   CSN: [id number] Arrival date & time: 07/26/17  1345     History   Chief Complaint Chief Complaint  Patient presents with  . Hyperglycemia    HPI Paula [name] is a 71 y.o. female.  The history is provided by the patient and medical records. No language interpreter was used.   Paula [name] is a 71 y.o. female  with a PMH of DM who presents to the Emergency Department complaining of hyperglycemia which she noticed today.  She was prescribed a prednisone taper on 12/10 has been taking this as directed for URI symptoms.  She has noticed significant improvement in URI symptoms.  This morning, she took her usual home medications including her glipizide.  She had spaghetti for lunch and then checked her sugar and noticed it was a little over 500.  She became very worried about her elevated glucose level, therefore took an extra glipizide pill today at 1 PM.  She states that other than her improving URI symptoms, she has no complaints.  Past Medical History:  Diagnosis Date  . Allergic rhinitis   . Arthritis   . Cough   . Diabetes mellitus   . Diastolic CHF, chronic ([place]) 10/11/2011  . GERD (gastroesophageal reflux disease)   . History of colon polyps 2012   tubular adenoma   . Hyperlipidemia   . Hypertension   . Neuropathy 10/11/2011  . PONV (postoperative nausea and vomiting)    trouble waking up    Patient Active Problem List   Diagnosis Date Noted  . Chronic pain 05/07/2017  . Palpitations 07/10/2016  . Insomnia 03/25/2016  . Gout 01/23/2016  . History of cataract 02/27/2015  . Seasonal and perennial allergic rhinitis 02/15/2012  . Cough 11/04/2011  . Diabetic neuropathy ([place]) 10/11/2011  . Diastolic CHF, chronic ([place]) 10/11/2011  . CKD (chronic kidney disease) stage 3, GFR 30-59 ml/min ([facility]) 10/10/2011  . Type 2 diabetes mellitus ([place]) 07/03/2010  . Hyperlipidemia 07/03/2010  . Essential  hypertension 07/03/2010    Past Surgical History:  Procedure Laterality Date  . CATARACT EXTRACTION     right eye  . lymphatic mass surgery    . NASAL TURBINATE REDUCTION      OB History    Gravida Para Term Preterm AB Living   5 4 4   1 4    SAB TAB Ectopic Multiple Live Births     1             Home Medications    Prior to Admission medications   Medication Sig Start Date End Date Taking? Authorizing Provider  albuterol (PROVENTIL HFA;VENTOLIN HFA) 108 (90 Base) MCG/ACT inhaler Inhale 2 puffs into the lungs every 6 (six) hours as needed. Shortness of breath 06/27/16  Yes Arnoldo Morale, MD  amLODipine (NORVASC) 10 MG tablet Take 10 mg by mouth daily with breakfast. 07/14/17  Yes [provider]  aspirin 81 MG tablet Take 81 mg by mouth daily.     Yes [provider]  calcium carbonate (OSCAL) 1500 (600 Ca) MG TABS tablet Take 1,500 mg by mouth daily with breakfast.   Yes [provider]  glipiZIDE (GLUCOTROL XL) 10 MG 24 hr tablet Take 1 tablet (10 mg total) by mouth daily with breakfast. 05/07/17  Yes Arnoldo Morale, MD  isosorbide mononitrate (IMDUR) 60 MG 24 hr tablet Take 1 tablet (60 mg total) by mouth daily. 07/13/17  Yes Arnoldo Morale, MD  pantoprazole (  PROTONIX) 40 MG tablet Take 1 tablet (40 mg total) by mouth daily. 07/14/17  Yes Collene Gobble, MD  Polyethyl Glycol-Propyl Glycol (SYSTANE) 0.4-0.3 % SOLN Place 1 drop into both eyes 2 (two) times daily.   Yes [provider]  predniSONE (DELTASONE) 10 MG tablet Take 40mg  daily for 3 days, then 30mg  daily for 3 days, then 20mg  daily for 3 days, then 10mg  daily for 3 days, then stop 07/20/17  Yes Byrum, Rose Fillers, MD  rosuvastatin (CRESTOR) 5 MG tablet Take 1 tablet (5 mg total) by mouth daily. 05/07/17  Yes Arnoldo Morale, MD  verapamil (CALAN) 80 MG tablet Take 1 tablet (80 mg total) by mouth 2 (two) times daily. 05/07/17  Yes Arnoldo Morale, MD  cloNIDine (CATAPRES) 0.1 MG tablet Take 1 tablet  (0.1 mg total) by mouth 2 (two) times daily. For opioid withdrawal Patient not taking: Reported on 07/14/2017 07/09/17   Arnoldo Morale, MD  glucose blood (TRUETEST TEST) test strip USE TO CHECK BLOOD AS DIRECTED ONCE DAILY 06/27/16   Arnoldo Morale, MD  tiZANidine (ZANAFLEX) 4 MG tablet Take 1 tablet (4 mg total) by mouth every 12 (twelve) hours as needed for muscle spasms. Patient not taking: Reported on 07/26/2017 06/30/17   Arnoldo Morale, MD    Family History Family History  Problem Relation Age of Onset  . Hypertension Mother   . Asthma Sister   . Colon cancer Neg Hx     Social History Social History   Tobacco Use  . Smoking status: Never Smoker  . Smokeless tobacco: Never Used  Substance Use Topics  . Alcohol use: No  . Drug use: No     Allergies   Dust mite extract; Ace inhibitors; and Lovastatin   Review of Systems Review of Systems  HENT: Positive for congestion.   Respiratory: Positive for cough.   All other systems reviewed and are negative.    Physical Exam Updated Vital Signs BP (!) 155/84 (BP Location: Left Arm)   Pulse 75   Temp 98.3 F (36.8 C) (Oral)   Resp (!) 26   Ht 5\' 2"  (1.575 m)   Wt 69.9 kg (154 lb)   LMP  (LMP Unknown)   SpO2 100%   BMI 28.17 kg/m   Physical Exam  Constitutional: She is oriented to person, place, and time. She appears well-developed and well-nourished. No distress.  HENT:  Head: Normocephalic and atraumatic.  Cardiovascular: Normal rate, regular rhythm and normal heart sounds.  No murmur heard. Pulmonary/Chest: Effort normal and breath sounds normal. No respiratory distress.  Lungs clear to auscultation bilaterally.  Abdominal: Soft. She exhibits no distension.  No abdominal tenderness.  Musculoskeletal: She exhibits no edema.  Neurological: She is alert and oriented to person, place, and time.  Skin: Skin is warm and dry.  Nursing note and vitals reviewed.    ED Treatments / Results  Labs (all labs ordered  are listed, but only abnormal results are displayed) Labs Reviewed  CBG MONITORING, ED - Abnormal; Notable for the following components:      Result Value   Glucose-Capillary 331 (*)    All other components within normal limits  CBC WITH DIFFERENTIAL/PLATELET  URINALYSIS, ROUTINE W REFLEX MICROSCOPIC  COMPREHENSIVE METABOLIC PANEL  CBG MONITORING, ED  CBG MONITORING, ED    EKG  EKG Interpretation None       Radiology No results found.  Procedures Procedures (including critical care time)  Medications Ordered in ED Medications - No data to  display   Initial Impression / Assessment and Plan / ED Course  I have reviewed the triage vital signs and the nursing notes.  Pertinent labs & imaging results that were available during my care of the patient were reviewed by me and considered in my medical decision making (see chart for details).    Paula [name] is a 71 y.o. female who presents to ED for hyperglycemia.  She has been on prednisone taper since 12/11 and also had spaghetti for lunch just prior to checking CBG.  (Likely contributory to elevated glucose level today.  She took her typical medications this morning, but after seeing her sugar at home was 500, she took an extra of her home 10 mg glipizide.  This was at 1 PM today.  CBG upon arrival 331.  Will obtain basic labs and continue to trend glucose.  CMP pending at shift change. Care assumed by oncoming provider PA [place]. Case discussed, plan agreed upon. Will follow up on pending CMP and monitor CBG's. If CBG's remain stable, likely discharge to home with close PCP follow up.   Patient discussed with Dr. Rogene Houston who agrees with treatment plan.    Final Clinical Impressions(s) / ED Diagnoses   Final diagnoses:  None    ED Discharge Orders    None       Ward, Ozella Almond, PA-C 07/26/17 1608    Fredia Sorrow, MD 07/27/17 [phone]

## 2017-07-26 NOTE — ED Provider Notes (Signed)
Care assumed from [facility], PA-C at shift change with CMP and CBG pending.  In brief, this patient is a 71 y.o. F who presents for evaluation of hyperglycemia.  Patient reports that she took her normal meds this morning including daily glipizide.  Patient then had spaghetti for lunch.  She rechecked her blood sugar and it was in the 500s.  She decided to take a second additional glipizide and come to the emergency department.  On physical exam, lungs are clear.  Initial blood glucose of 500.  Blood glucose at this time is 330.  Please see full note by Amie Portland for further ED course.   PLAN: Plan is to watch patient in the department for 5 hours.  Her distal time would be 7 PM.  Plan for hourly CBG checks.  If patient's blood glucose remains greater than 100, plan for dispel with primary care follow-up.  Patient instructed to resume her normal medications tomorrow.   Repeat CBG is 288.  Discussed with patient.  No complaints at this time.  Repeat CBG is 293.  At this point patient has been monitored in the ED for 5 hours.  Dr. patient to continue taking prednisone taper until completion. Discussed patient with Dr. Roderic Palau.  Given that patient is going to be completing the steroid taper and has had further hyperglycemia, will plan to add additional 5 mg glipizide to her normal medications.  Directed patient to follow-up with her primary care doctor in the next 24-48 hours for further evaluation. Strict return precautions discussed. Patient expresses understanding and agreement to plan.     1. Hyperglycemia       Desma Mcgregor 07/27/17 5456    Milton Ferguson, MD 07/28/17 [phone]

## 2017-07-26 NOTE — ED Notes (Signed)
Bed: XY72 Expected date: 07/26/17 Expected time: 1:48 PM Means of arrival: Ambulance Comments: Hyperglycemia

## 2017-07-26 NOTE — ED Triage Notes (Signed)
Pt arrived by [facility] from home after checking her sugar this morning and seeing that it was 500. Patient took 2 doses of glipizide after seeing high CBG. Has hx of diabetes type 2.  Patient states that she is "dazy" and has increased thirst/urination. Pt denies any other symptoms.

## 2017-07-26 NOTE — Discharge Instructions (Addendum)
It was my pleasure taking care of you today!   Take your diabetes medication only as prescribed.   Watch your blood sugar closely at home and follow a low carb, low sugar diet. Decrease your amount of pastas, breads, sugars, etc.  Follow up with your primary care provider for further discussion of your hospital visit today.   Take your normal 10 mg Glipizide and the 5mg  Glipizide as directed.   Return to ER for new or worsening symptoms, any additional concerns.

## 2017-07-27 MED FILL — glipiZIDE XL 10 MG TB24: 10 | 30 days supply | Qty: 30 | Fill #1 | Status: TO

## 2017-07-28 ENCOUNTER — Telehealth: Payer: Self-pay | Admitting: Family Medicine

## 2017-07-28 ENCOUNTER — Telehealth: Payer: Self-pay | Admitting: Internal Medicine

## 2017-07-28 ENCOUNTER — Other Ambulatory Visit: Payer: Self-pay | Admitting: Family Medicine

## 2017-07-28 MED ORDER — GLUCOSE BLOOD VI STRP: ORAL_STRIP | 5 refills | Status: DC

## 2017-07-28 MED FILL — TRUE METRIX TEST STRIP: 100 days supply | Qty: 100 | Fill #0 | Status: TO

## 2017-07-28 NOTE — Telephone Encounter (Signed)
Refilled

## 2017-07-28 NOTE — Telephone Encounter (Signed)
Patient needs glucose meter test strips sent to [facility]

## 2017-07-28 NOTE — Telephone Encounter (Signed)
Triage: answsering just called. SOme med is causing high bp. Please call and enquire I am headin gout. YUOu can call me on my cell

## 2017-07-29 MED FILL — ISOSORBIDE MN ER 60 MG TAB: 60 | 30 days supply | Qty: 30 | Fill #0 | Status: TO

## 2017-07-29 NOTE — Telephone Encounter (Signed)
ATC pt, VM box has not been set up yet. WCB.  

## 2017-08-05 MED FILL — ROSUVASTATIN CALCIUM 5 MG T: 5 | 30 days supply | Qty: 30 | Fill #4 | Status: TO

## 2017-08-07 ENCOUNTER — Ambulatory Visit: Payer: Self-pay | Admitting: Family Medicine

## 2017-08-18 ENCOUNTER — Ambulatory Visit: Payer: Self-pay | Admitting: Emergency Medicine

## 2017-08-20 ENCOUNTER — Encounter: Payer: Self-pay | Admitting: Family Medicine

## 2017-08-20 ENCOUNTER — Ambulatory Visit: Payer: BLUE CROSS/BLUE SHIELD | Attending: Family Medicine | Admitting: Family Medicine

## 2017-08-20 VITALS — BP 138/70 | HR 73 | Temp 98.0°F | Ht 63.0 in | Wt 156.0 lb

## 2017-08-20 DIAGNOSIS — J3089 Other allergic rhinitis: Secondary | ICD-10-CM | POA: Diagnosis not present

## 2017-08-20 DIAGNOSIS — K219 Gastro-esophageal reflux disease without esophagitis: Secondary | ICD-10-CM | POA: Diagnosis not present

## 2017-08-20 DIAGNOSIS — E7800 Pure hypercholesterolemia, unspecified: Secondary | ICD-10-CM | POA: Diagnosis not present

## 2017-08-20 DIAGNOSIS — I10 Essential (primary) hypertension: Secondary | ICD-10-CM

## 2017-08-20 DIAGNOSIS — Z79899 Other long term (current) drug therapy: Secondary | ICD-10-CM | POA: Diagnosis not present

## 2017-08-20 DIAGNOSIS — J309 Allergic rhinitis, unspecified: Secondary | ICD-10-CM | POA: Insufficient documentation

## 2017-08-20 DIAGNOSIS — I5032 Chronic diastolic (congestive) heart failure: Secondary | ICD-10-CM | POA: Insufficient documentation

## 2017-08-20 DIAGNOSIS — N183 Chronic kidney disease, stage 3 (moderate): Secondary | ICD-10-CM | POA: Insufficient documentation

## 2017-08-20 DIAGNOSIS — Z86010 Personal history of colonic polyps: Secondary | ICD-10-CM | POA: Insufficient documentation

## 2017-08-20 DIAGNOSIS — E1122 Type 2 diabetes mellitus with diabetic chronic kidney disease: Secondary | ICD-10-CM | POA: Insufficient documentation

## 2017-08-20 DIAGNOSIS — Z7982 Long term (current) use of aspirin: Secondary | ICD-10-CM | POA: Diagnosis not present

## 2017-08-20 DIAGNOSIS — I130 Hypertensive heart and chronic kidney disease with heart failure and stage 1 through stage 4 chronic kidney disease, or unspecified chronic kidney disease: Secondary | ICD-10-CM | POA: Insufficient documentation

## 2017-08-20 DIAGNOSIS — E1100 Type 2 diabetes mellitus with hyperosmolarity without nonketotic hyperglycemic-hyperosmolar coma (NKHHC): Secondary | ICD-10-CM | POA: Insufficient documentation

## 2017-08-20 HISTORY — DX: Allergic rhinitis, unspecified: J30.9

## 2017-08-20 LAB — GLUCOSE, POCT (MANUAL RESULT ENTRY): POC Glucose: 253 mg/dl — AB (ref 70–99)

## 2017-08-20 LAB — POCT GLYCOSYLATED HEMOGLOBIN (HGB A1C): Hemoglobin A1C: 8.3

## 2017-08-20 MED ORDER — ISOSORBIDE MONONITRATE ER 60 MG PO TB24: 60.0000 mg | ORAL_TABLET | Freq: Every day | ORAL | 1 refills | Status: DC

## 2017-08-20 MED ORDER — ROSUVASTATIN CALCIUM 5 MG PO TABS: 5.0000 mg | ORAL_TABLET | Freq: Every day | ORAL | 1 refills | Status: DC

## 2017-08-20 MED ORDER — GLIPIZIDE 10 MG PO TABS: 10.0000 mg | ORAL_TABLET | Freq: Two times a day (BID) | ORAL | 3 refills | Status: DC

## 2017-08-20 MED ORDER — VERAPAMIL HCL 80 MG PO TABS: 80.0000 mg | ORAL_TABLET | Freq: Two times a day (BID) | ORAL | 1 refills | Status: DC

## 2017-08-20 MED ORDER — CETIRIZINE HCL 10 MG PO TABS: 10.0000 mg | ORAL_TABLET | Freq: Every day | ORAL | 1 refills | Status: DC

## 2017-08-20 NOTE — Progress Notes (Signed)
Subjective:  Patient ID: Paula [name], female    DOB: 11-24-1945  Age: 72 y.o. MRN: [id number]  CC: Diabetes   HPI Paula [name] is a 72 year old female with a history of type 2 diabetes mellitus (A1c 8.3 ), hypertension, hypercholesterolemia, allergic rhinitis, stage III CKD (managed by Dr Pamala Duffel Kidney) who comes into the clinic for a follow-up visit.  She had an ED visit 3 weeks ago due to hyperglycemia as her sugars had been running in the 400s after she had been placed on prednisone by pulmonary for management of chronic allergic rhinitis. She informs me she has been taking 15mg  of glipizide in the morning and 5 mg in the evening and her blood sugar log reveals her fasting sugars have been in the 170 range. She denies hypoglycemic episodes, visual concerns or numbness in extremities.  She is unhappy about her allergic rhinitis which is uncontrolled as she has constant postnasal drip, itching of her eyes and stays congested despite using Nasonex.  She informs me the symptoms were absent when she was on tramadol which she had been taking for 5 years but was recently discontinued due to the fact that I found she had no indication for chronic tramadol use. She informs me she also takes Zyrtec however she does not have that on her med list.  She has been compliant with her antihypertensive and her statin and denies the presence of myalgias.  Past Medical History:  Diagnosis Date  . Allergic rhinitis   . Arthritis   . Cough   . Diabetes mellitus   . Diastolic CHF, chronic ([place]) 10/11/2011  . GERD (gastroesophageal reflux disease)   . History of colon polyps 2012   tubular adenoma   . Hyperlipidemia   . Hypertension   . Neuropathy 10/11/2011  . PONV (postoperative nausea and vomiting)    trouble waking up    Past Surgical History:  Procedure Laterality Date  . CATARACT EXTRACTION     right eye  . lymphatic mass surgery    . NASAL TURBINATE REDUCTION       Allergies  Allergen Reactions  . Dust Mite Extract Cough  . Ace Inhibitors Cough    chronic cough  . Lovastatin Other (See Comments)    Generalized body pain     Outpatient Medications Prior to Visit  Medication Sig Dispense Refill  . albuterol (PROVENTIL HFA;VENTOLIN HFA) 108 (90 Base) MCG/ACT inhaler Inhale 2 puffs into the lungs every 6 (six) hours as needed. Shortness of breath 18 g 2  . aspirin 81 MG tablet Take 81 mg by mouth daily.      . calcium carbonate (OSCAL) 1500 (600 Ca) MG TABS tablet Take 1,500 mg by mouth daily with breakfast.    . pantoprazole (PROTONIX) 40 MG tablet Take 1 tablet (40 mg total) by mouth daily. 30 tablet 5  . Polyethyl Glycol-Propyl Glycol (SYSTANE) 0.4-0.3 % SOLN Place 1 drop into both eyes 2 (two) times daily.    . isosorbide mononitrate (IMDUR) 60 MG 24 hr tablet Take 1 tablet (60 mg total) by mouth daily. 30 tablet 3  . rosuvastatin (CRESTOR) 5 MG tablet Take 1 tablet (5 mg total) by mouth daily. 90 tablet 1  . verapamil (CALAN) 80 MG tablet Take 1 tablet (80 mg total) by mouth 2 (two) times daily. 180 tablet 1  . glucose blood (TRUETEST TEST) test strip USE TO CHECK BLOOD AS DIRECTED THREE TIMES DAILY (Patient not taking: Reported on 08/20/2017) 100 each  5  . amLODipine (NORVASC) 10 MG tablet Take 10 mg by mouth daily with breakfast.  1  . cloNIDine (CATAPRES) 0.1 MG tablet Take 1 tablet (0.1 mg total) by mouth 2 (two) times daily. For opioid withdrawal (Patient not taking: Reported on 07/14/2017) 30 tablet 0  . glipiZIDE (GLUCOTROL) 5 MG tablet Take 1 tablet (5 mg total) by mouth daily before breakfast for 5 days. 5 tablet 0  . predniSONE (DELTASONE) 10 MG tablet Take 40mg  daily for 3 days, then 30mg  daily for 3 days, then 20mg  daily for 3 days, then 10mg  daily for 3 days, then stop (Patient not taking: Reported on 08/20/2017) 30 tablet 0  . tiZANidine (ZANAFLEX) 4 MG tablet Take 1 tablet (4 mg total) by mouth every 12 (twelve) hours as needed for  muscle spasms. (Patient not taking: Reported on 07/26/2017) 60 tablet 1   No facility-administered medications prior to visit.     ROS Review of Systems  Constitutional: Negative for activity change, appetite change and fatigue.  HENT: Positive for postnasal drip. Negative for congestion, sinus pressure and sore throat.   Eyes: Positive for itching. Negative for visual disturbance.  Respiratory: Negative for cough, chest tightness, shortness of breath and wheezing.   Cardiovascular: Negative for chest pain and palpitations.  Gastrointestinal: Negative for abdominal distention, abdominal pain and constipation.  Endocrine: Negative for polydipsia.  Genitourinary: Negative for dysuria and frequency.  Musculoskeletal: Negative for arthralgias and back pain.  Skin: Negative for rash.  Neurological: Negative for tremors, light-headedness and numbness.  Hematological: Does not bruise/bleed easily.  Psychiatric/Behavioral: Negative for agitation and behavioral problems.    Objective:  BP 138/70   Pulse 73   Temp 98 F (36.7 C) (Oral)   Ht 5\' 3"  (1.6 m)   Wt 156 lb (70.8 kg)   LMP  (LMP Unknown)   SpO2 99%   BMI 27.63 kg/m   BP/Weight 08/20/2017 07/26/2017 29/12/1882  Systolic BP 166 063 016  Diastolic BP 70 80 80  Wt. (Lbs) 156 154 155.4  BMI 27.63 28.17 27.53      Physical Exam Constitutional: normal appearing,  Eyes: PERRLA HEENT: Head is atraumatic, normal sinuses, post nasal drip in oropharynx, normal appearing tonsils and palate, tympanic membrane is normal bilaterally. Neck: normal range of motion, no thyromegaly, no JVD Cardiovascular: normal rate and rhythm, normal heart sounds, no murmurs, rub or gallop, no pedal edema Respiratory: clear to auscultation bilaterally, no wheezes, no rales, no rhonchi Abdomen: soft, not tender to palpation, normal bowel sounds, no enlarged organs Extremities: Full ROM, no tenderness in joints Skin: warm and dry, no  lesions. Neurological: alert, oriented x3, cranial nerves I-XII grossly intact , normal motor strength, normal sensation. Psychological: normal mood.   CMP Latest Ref Rng & Units 07/26/2017 07/02/2017 05/08/2017  Glucose 65 - 99 mg/dL 354(H) 174(H) 201(H)  BUN 6 - 20 mg/dL 30(H) 27(H) 22  Creatinine 0.44 - 1.00 mg/dL 1.69(H) 2.38(H) 1.58(H)  Sodium 135 - 145 mmol/L 135 138 143  Potassium 3.5 - 5.1 mmol/L 4.0 3.7 4.1  Chloride 101 - 111 mmol/L 98(L) 104 102  CO2 22 - 32 mmol/L 28 26 25   Calcium 8.9 - 10.3 mg/dL 9.0 8.9 9.5  Total Protein 6.5 - 8.1 g/dL 7.3 7.2 7.2  Total Bilirubin 0.3 - 1.2 mg/dL 0.5 0.3 0.4  Alkaline Phos 38 - 126 U/L 73 70 85  AST 15 - 41 U/L 30 23 18   ALT 14 - 54 U/L 34 20 19  Lipid Panel     Component Value Date/Time   CHOL 145 05/08/2017 0845   TRIG 90 05/08/2017 0845   HDL 46 05/08/2017 0845   CHOLHDL 3.2 05/08/2017 0845   CHOLHDL 2.9 01/25/2016 0901   VLDL 20 01/25/2016 0901   LDLCALC 81 05/08/2017 0845    Lab Results  Component Value Date   HGBA1C 8.3 08/20/2017    Assessment & Plan:   1. Type 2 diabetes mellitus with hyperosmolarity without coma, without long-term current use of insulin (HCC) Controlled with A1c of 8.3 Recent hyperglycemia due to treatment with prednisone which she received from pulmonary She is not open to the idea of insulin and so we will continue with glipizide at this time; unable to use metformin due to chronic kidney disease Counseled on diabetic diet - POCT glucose (manual entry) - POCT glycosylated hemoglobin (Hb A1C)  2. Pure hypercholesterolemia Controlled Low-cholesterol diet - rosuvastatin (CRESTOR) 5 MG tablet; Take 1 tablet (5 mg total) by mouth daily.  Dispense: 90 tablet; Refill: 1  3. Essential hypertension Controlled Low-sodium diet - verapamil (CALAN) 80 MG tablet; Take 1 tablet (80 mg total) by mouth 2 (two) times daily.  Dispense: 180 tablet; Refill: 1  4. Non-seasonal allergic rhinitis due to  other allergic trigger Symptoms have worsened ever since her tramadol was discontinued as per patient Uncontrolled on Nasonex Patient informs me she is taking Nasonex and Zyrtec even though I do not see it on her med list I have prescribed Zyrtec Will refer to allergy and immunology for optimization - Ambulatory referral to Allergy   Meds ordered this encounter  Medications  . glipiZIDE (GLUCOTROL) 10 MG tablet    Sig: Take 1 tablet (10 mg total) by mouth 2 (two) times daily before a meal.    Dispense:  60 tablet    Refill:  3  . cetirizine (ZYRTEC) 10 MG tablet    Sig: Take 1 tablet (10 mg total) by mouth daily.    Dispense:  90 tablet    Refill:  1  . rosuvastatin (CRESTOR) 5 MG tablet    Sig: Take 1 tablet (5 mg total) by mouth daily.    Dispense:  90 tablet    Refill:  1  . verapamil (CALAN) 80 MG tablet    Sig: Take 1 tablet (80 mg total) by mouth 2 (two) times daily.    Dispense:  180 tablet    Refill:  1    Ninety-day supply  . isosorbide mononitrate (IMDUR) 60 MG 24 hr tablet    Sig: Take 1 tablet (60 mg total) by mouth daily.    Dispense:  90 tablet    Refill:  1    Follow-up: Return in about 3 months (around 11/18/2017) for follow up on chronic medical conditions.   Arnoldo Morale MD

## 2017-08-20 NOTE — Patient Instructions (Signed)

## 2017-09-01 ENCOUNTER — Emergency Department (HOSPITAL_COMMUNITY): Payer: BLUE CROSS/BLUE SHIELD

## 2017-09-01 ENCOUNTER — Encounter (HOSPITAL_COMMUNITY): Payer: Self-pay | Admitting: Emergency Medicine

## 2017-09-01 ENCOUNTER — Inpatient Hospital Stay (HOSPITAL_COMMUNITY)
Admission: EM | Admit: 2017-09-01 | Discharge: 2017-09-03 | DRG: 071 | Disposition: A | Discharge status: Home / Self Care | Payer: BLUE CROSS/BLUE SHIELD | Attending: Family Medicine | Admitting: Family Medicine

## 2017-09-01 ENCOUNTER — Inpatient Hospital Stay (HOSPITAL_COMMUNITY): Payer: BLUE CROSS/BLUE SHIELD

## 2017-09-01 DIAGNOSIS — Z79891 Long term (current) use of opiate analgesic: Secondary | ICD-10-CM | POA: Diagnosis not present

## 2017-09-01 DIAGNOSIS — J069 Acute upper respiratory infection, unspecified: Secondary | ICD-10-CM | POA: Diagnosis present

## 2017-09-01 DIAGNOSIS — E871 Hypo-osmolality and hyponatremia: Secondary | ICD-10-CM | POA: Diagnosis present

## 2017-09-01 DIAGNOSIS — Z91048 Other nonmedicinal substance allergy status: Secondary | ICD-10-CM

## 2017-09-01 DIAGNOSIS — G9341 Metabolic encephalopathy: Secondary | ICD-10-CM | POA: Diagnosis present

## 2017-09-01 DIAGNOSIS — E785 Hyperlipidemia, unspecified: Secondary | ICD-10-CM | POA: Diagnosis present

## 2017-09-01 DIAGNOSIS — R29810 Facial weakness: Secondary | ICD-10-CM

## 2017-09-01 DIAGNOSIS — I10 Essential (primary) hypertension: Secondary | ICD-10-CM | POA: Diagnosis not present

## 2017-09-01 DIAGNOSIS — Z7984 Long term (current) use of oral hypoglycemic drugs: Secondary | ICD-10-CM | POA: Diagnosis not present

## 2017-09-01 DIAGNOSIS — E119 Type 2 diabetes mellitus without complications: Secondary | ICD-10-CM | POA: Diagnosis not present

## 2017-09-01 DIAGNOSIS — J181 Lobar pneumonia, unspecified organism: Secondary | ICD-10-CM | POA: Diagnosis not present

## 2017-09-01 DIAGNOSIS — N183 Chronic kidney disease, stage 3 unspecified: Secondary | ICD-10-CM | POA: Diagnosis present

## 2017-09-01 DIAGNOSIS — K219 Gastro-esophageal reflux disease without esophagitis: Secondary | ICD-10-CM | POA: Diagnosis present

## 2017-09-01 DIAGNOSIS — G4700 Insomnia, unspecified: Secondary | ICD-10-CM | POA: Diagnosis present

## 2017-09-01 DIAGNOSIS — G8929 Other chronic pain: Secondary | ICD-10-CM | POA: Diagnosis present

## 2017-09-01 DIAGNOSIS — E872 Acidosis: Secondary | ICD-10-CM | POA: Diagnosis present

## 2017-09-01 DIAGNOSIS — R410 Disorientation, unspecified: Secondary | ICD-10-CM | POA: Diagnosis present

## 2017-09-01 DIAGNOSIS — Z8249 Family history of ischemic heart disease and other diseases of the circulatory system: Secondary | ICD-10-CM | POA: Diagnosis not present

## 2017-09-01 DIAGNOSIS — J189 Pneumonia, unspecified organism: Secondary | ICD-10-CM

## 2017-09-01 DIAGNOSIS — Z7982 Long term (current) use of aspirin: Secondary | ICD-10-CM | POA: Diagnosis not present

## 2017-09-01 DIAGNOSIS — Z79899 Other long term (current) drug therapy: Secondary | ICD-10-CM | POA: Diagnosis not present

## 2017-09-01 DIAGNOSIS — I5032 Chronic diastolic (congestive) heart failure: Secondary | ICD-10-CM | POA: Diagnosis present

## 2017-09-01 DIAGNOSIS — E860 Dehydration: Secondary | ICD-10-CM | POA: Diagnosis present

## 2017-09-01 DIAGNOSIS — M109 Gout, unspecified: Secondary | ICD-10-CM | POA: Diagnosis present

## 2017-09-01 DIAGNOSIS — E1122 Type 2 diabetes mellitus with diabetic chronic kidney disease: Secondary | ICD-10-CM | POA: Diagnosis present

## 2017-09-01 DIAGNOSIS — J309 Allergic rhinitis, unspecified: Secondary | ICD-10-CM | POA: Diagnosis present

## 2017-09-01 DIAGNOSIS — B9729 Other coronavirus as the cause of diseases classified elsewhere: Secondary | ICD-10-CM | POA: Diagnosis present

## 2017-09-01 DIAGNOSIS — I130 Hypertensive heart and chronic kidney disease with heart failure and stage 1 through stage 4 chronic kidney disease, or unspecified chronic kidney disease: Secondary | ICD-10-CM | POA: Diagnosis present

## 2017-09-01 DIAGNOSIS — Z86010 Personal history of colonic polyps: Secondary | ICD-10-CM

## 2017-09-01 DIAGNOSIS — I509 Heart failure, unspecified: Secondary | ICD-10-CM | POA: Diagnosis not present

## 2017-09-01 DIAGNOSIS — E1142 Type 2 diabetes mellitus with diabetic polyneuropathy: Secondary | ICD-10-CM | POA: Diagnosis present

## 2017-09-01 DIAGNOSIS — Z888 Allergy status to other drugs, medicaments and biological substances status: Secondary | ICD-10-CM

## 2017-09-01 LAB — URINALYSIS, ROUTINE W REFLEX MICROSCOPIC
Bacteria, UA: NONE SEEN
Bilirubin Urine: NEGATIVE
Glucose, UA: 150 mg/dL — AB
Hgb urine dipstick: NEGATIVE
Ketones, ur: 5 mg/dL — AB
Leukocytes, UA: NEGATIVE
Nitrite: NEGATIVE
Protein, ur: 100 mg/dL — AB
Specific Gravity, Urine: 1.006 (ref 1.005–1.030)
pH: 8 (ref 5.0–8.0)

## 2017-09-01 LAB — CBG MONITORING, ED: Glucose-Capillary: 167 mg/dL — ABNORMAL HIGH (ref 65–99)

## 2017-09-01 LAB — COMPREHENSIVE METABOLIC PANEL
ALT: 22 U/L (ref 14–54)
AST: 34 U/L (ref 15–41)
Albumin: 4.3 g/dL (ref 3.5–5.0)
Alkaline Phosphatase: 76 U/L (ref 38–126)
Anion gap: 14 (ref 5–15)
BUN: 22 mg/dL — ABNORMAL HIGH (ref 6–20)
CO2: 25 mmol/L (ref 22–32)
Calcium: 9.4 mg/dL (ref 8.9–10.3)
Chloride: 92 mmol/L — ABNORMAL LOW (ref 101–111)
Creatinine, Ser: 1.75 mg/dL — ABNORMAL HIGH (ref 0.44–1.00)
GFR calc Af Amer: 33 mL/min — ABNORMAL LOW (ref >60)
GFR calc non Af Amer: 28 mL/min — ABNORMAL LOW (ref >60)
Glucose, Bld: 163 mg/dL — ABNORMAL HIGH (ref 65–99)
Potassium: 3.4 mmol/L — ABNORMAL LOW (ref 3.5–5.1)
Sodium: 131 mmol/L — ABNORMAL LOW (ref 135–145)
Total Bilirubin: 0.9 mg/dL (ref 0.3–1.2)
Total Protein: 7.7 g/dL (ref 6.5–8.1)

## 2017-09-01 LAB — CBC WITH DIFFERENTIAL/PLATELET
Basophils Absolute: 0 10*3/uL (ref 0.0–0.1)
Basophils Relative: 1 %
Eosinophils Absolute: 0 10*3/uL (ref 0.0–0.7)
Eosinophils Relative: 1 %
HCT: 36.2 % (ref 36.0–46.0)
Hemoglobin: 12.9 g/dL (ref 12.0–15.0)
Lymphocytes Relative: 16 %
Lymphs Abs: 1 10*3/uL (ref 0.7–4.0)
MCH: 28.9 pg (ref 26.0–34.0)
MCHC: 35.6 g/dL (ref 30.0–36.0)
MCV: 81 fL (ref 78.0–100.0)
Monocytes Absolute: 0.7 10*3/uL (ref 0.1–1.0)
Monocytes Relative: 10 %
Neutro Abs: 4.6 10*3/uL (ref 1.7–7.7)
Neutrophils Relative %: 72 %
Platelets: 240 10*3/uL (ref 150–400)
RBC: 4.47 MIL/uL (ref 3.87–5.11)
RDW: 12.9 % (ref 11.5–15.5)
WBC: 6.3 10*3/uL (ref 4.0–10.5)

## 2017-09-01 LAB — RAPID URINE DRUG SCREEN, HOSP PERFORMED
Amphetamines: NOT DETECTED
Barbiturates: NOT DETECTED
Benzodiazepines: NOT DETECTED
Cocaine: NOT DETECTED
Opiates: NOT DETECTED
Tetrahydrocannabinol: NOT DETECTED

## 2017-09-01 LAB — APTT: aPTT: 27 seconds (ref 24–36)

## 2017-09-01 LAB — I-STAT CHEM 8, ED
BUN: 26 mg/dL — ABNORMAL HIGH (ref 6–20)
Calcium, Ion: 1.04 mmol/L — ABNORMAL LOW (ref 1.15–1.40)
Chloride: 93 mmol/L — ABNORMAL LOW (ref 101–111)
Creatinine, Ser: 1.5 mg/dL — ABNORMAL HIGH (ref 0.44–1.00)
Glucose, Bld: 169 mg/dL — ABNORMAL HIGH (ref 65–99)
HCT: 42 % (ref 36.0–46.0)
Hemoglobin: 14.3 g/dL (ref 12.0–15.0)
Potassium: 3.6 mmol/L (ref 3.5–5.1)
Sodium: 133 mmol/L — ABNORMAL LOW (ref 135–145)
TCO2: 27 mmol/L (ref 22–32)

## 2017-09-01 LAB — I-STAT CG4 LACTIC ACID, ED
Lactic Acid, Venous: 3.97 mmol/L (ref 0.5–1.9)
Lactic Acid, Venous: 3.48 mmol/L (ref 0.5–1.9)

## 2017-09-01 LAB — I-STAT TROPONIN, ED: Troponin i, poc: 0 ng/mL (ref 0.00–0.08)

## 2017-09-01 LAB — ETHANOL: Alcohol, Ethyl (B): <10 mg/dL (ref <10)

## 2017-09-01 LAB — PROTIME-INR
INR: 1.01
Prothrombin Time: 13.2 seconds (ref 11.4–15.2)

## 2017-09-01 MED ORDER — HYDRALAZINE HCL 20 MG/ML IJ SOLN
10.0000 mg | Freq: Once | INTRAMUSCULAR | Status: AC
  Administered 2017-09-01: 10 mg via INTRAVENOUS
  Filled 2017-09-01: qty 1

## 2017-09-01 MED ORDER — HYDRALAZINE HCL 20 MG/ML IJ SOLN
10.0000 mg | Freq: Four times a day (QID) | INTRAMUSCULAR | Status: DC | PRN
  Administered 2017-09-03: 10 mg via INTRAVENOUS
  Filled 2017-09-01: qty 1

## 2017-09-01 MED ORDER — ACETAMINOPHEN 325 MG PO TABS
650.0000 mg | ORAL_TABLET | Freq: Once | ORAL | Status: AC | PRN
  Administered 2017-09-01: 650 mg via ORAL
  Filled 2017-09-01: qty 2

## 2017-09-01 MED ORDER — AZITHROMYCIN 500 MG IV SOLR
500.0000 mg | Freq: Once | INTRAVENOUS | Status: AC
  Administered 2017-09-01: 500 mg via INTRAVENOUS
  Filled 2017-09-01: qty 500

## 2017-09-01 MED ORDER — PANTOPRAZOLE SODIUM 40 MG IV SOLR
40.0000 mg | Freq: Two times a day (BID) | INTRAVENOUS | Status: DC
  Administered 2017-09-02 – 2017-09-03 (×4): 40 mg via INTRAVENOUS
  Filled 2017-09-01 (×6): qty 40

## 2017-09-01 MED ORDER — DEXTROSE 5 % IV SOLN
1.0000 g | Freq: Once | INTRAVENOUS | Status: AC
  Administered 2017-09-01: 1 g via INTRAVENOUS
  Filled 2017-09-01: qty 10

## 2017-09-01 MED ORDER — FLUTICASONE PROPIONATE 50 MCG/ACT NA SUSP
2.0000 | Freq: Every day | NASAL | Status: DC
  Administered 2017-09-02 (×2): 2 via NASAL
  Filled 2017-09-01: qty 16

## 2017-09-01 MED ORDER — KETOROLAC TROMETHAMINE 15 MG/ML IJ SOLN
7.5000 mg | Freq: Once | INTRAMUSCULAR | Status: AC
  Administered 2017-09-01: 7.5 mg via INTRAVENOUS
  Filled 2017-09-01: qty 1

## 2017-09-01 MED ORDER — SODIUM CHLORIDE 0.9 % IV BOLUS (SEPSIS)
1000.0000 mL | Freq: Once | INTRAVENOUS | Status: AC
  Administered 2017-09-01: 1000 mL via INTRAVENOUS

## 2017-09-01 MED ORDER — SODIUM CHLORIDE 0.9 % IV SOLN
INTRAVENOUS | Status: DC
  Administered 2017-09-02 (×2): via INTRAVENOUS

## 2017-09-01 MED ORDER — METOPROLOL TARTRATE 5 MG/5ML IV SOLN
5.0000 mg | Freq: Three times a day (TID) | INTRAVENOUS | Status: DC
  Filled 2017-09-01 (×2): qty 5

## 2017-09-01 NOTE — H&P (Addendum)
History and Physical  Paula [name]:[id number] DOB: 08-26-1945 DOA: 09/01/2017  Referring physician: EDP PCP: Arnoldo Morale, MD   Chief Complaint: cough and severe headache, confusion  HPI: Paula [name] is a 72 y.o. female  H/o noninsulin dependent diabetes, HTN, HLD, diastolic chf presented to the emergency room with above complaints.  Currently patient seems to be confused, not able to provide detailed history.  HPI obtained from chart review and talking to daughter at bedside. Per daughter patient has a chronic cough, however cough is got worse in the last 2 weeks.  Cough is nonproductive.  Daughter denies fever.  In the last week patient has been sitting up at night due to worsening of cough. There is no chest pain, no edema.  She also complained of severe headache when she coughs. Patient has been taking mucinex d at home for this. Daughter reported that her blood pressure and blood sugar has been elevated recently. Daughter also observed a brief slurred speech and facial droop which has resolved upon arrival, CT head negative.   No hypoxia, tmax 100,2, blood pressure elevated.  CBC unremarkable, glucose 169, BUN 26 creatinine 1.5 which is close to baseline, lft unremarkable,  troponin negative. ekg sinus rhythm, diffuse st/t flattening, no acute changes.  however lactic acid is elevated at 3.97 (daughter reports patient takes metformin which is not on home med list), UA unremarkable. Cxr: Mild left base atelectasis/infiltrate.  She is given 1 L of normal saline,  Zithromax and Rocephinx1, hospitalist called to admit the patient.  Review of Systems:  Detail per HPI, Review of systems are otherwise negative  Past Medical History:  Diagnosis Date  . Allergic rhinitis   . Arthritis   . Cough   . Diabetes mellitus   . Diastolic CHF, chronic ([place]) 10/11/2011  . GERD (gastroesophageal reflux disease)   . History of colon polyps 2012   tubular adenoma   . Hyperlipidemia     . Hypertension   . Neuropathy 10/11/2011  . PONV (postoperative nausea and vomiting)    trouble waking up   Past Surgical History:  Procedure Laterality Date  . CATARACT EXTRACTION     right eye  . lymphatic mass surgery    . NASAL TURBINATE REDUCTION     Social History:  reports that  has never smoked. she has never used smokeless tobacco. She reports that she does not drink alcohol or use drugs. Patient lives at home with daughter & is able to participate in activities of daily living independently prior to this   Allergies  Allergen Reactions  . Dust Mite Extract Cough  . Ace Inhibitors Cough    chronic cough  . Lovastatin Other (See Comments)    Generalized body pain    Family History  Problem Relation Age of Onset  . Hypertension Mother   . Asthma Sister   . Colon cancer Neg Hx       Prior to Admission medications   Medication Sig Start Date End Date Taking? Authorizing Provider  albuterol (PROVENTIL HFA;VENTOLIN HFA) 108 (90 Base) MCG/ACT inhaler Inhale 2 puffs into the lungs every 6 (six) hours as needed. Shortness of breath 06/27/16  Yes Arnoldo Morale, MD  aspirin 81 MG tablet Take 81 mg by mouth daily.     Yes [provider]  calcium carbonate (OSCAL) 1500 (600 Ca) MG TABS tablet Take 1,500 mg by mouth daily with breakfast.   Yes [provider]  cetirizine (ZYRTEC) 10 MG tablet Take  1 tablet (10 mg total) by mouth daily. 08/20/17  Yes Arnoldo Morale, MD  glipiZIDE (GLUCOTROL) 10 MG tablet Take 1 tablet (10 mg total) by mouth 2 (two) times daily before a meal. 08/20/17  Yes Amao, Charlane Ferretti, MD  isosorbide mononitrate (IMDUR) 60 MG 24 hr tablet Take 1 tablet (60 mg total) by mouth daily. 08/20/17  Yes Arnoldo Morale, MD  Melatonin 10 MG TABS Take 1 tablet by mouth at bedtime.   Yes [provider]  pantoprazole (PROTONIX) 40 MG tablet Take 1 tablet (40 mg total) by mouth daily. Patient taking differently: Take 40 mg by mouth daily as needed  (heartburn).  07/14/17  Yes Collene Gobble, MD  pseudoephedrine-guaifenesin (MUCINEX D) 60-600 MG 12 hr tablet Take 1 tablet by mouth every 12 (twelve) hours as needed for congestion.   Yes [provider]  rosuvastatin (CRESTOR) 5 MG tablet Take 1 tablet (5 mg total) by mouth daily. 08/20/17  Yes Arnoldo Morale, MD  verapamil (CALAN) 80 MG tablet Take 1 tablet (80 mg total) by mouth 2 (two) times daily. 08/20/17  Yes Arnoldo Morale, MD  GLIPIZIDE XL 10 MG 24 hr tablet Take 10 mg by mouth daily with breakfast. 07/27/17   [provider]  glucose blood (TRUETEST TEST) test strip USE TO CHECK BLOOD AS DIRECTED THREE TIMES DAILY Patient not taking: Reported on 08/20/2017 07/28/17   Arnoldo Morale, MD  mometasone (NASONEX) 50 MCG/ACT nasal spray Place 2 sprays into the nose daily as needed (allergies).  08/13/17   [provider]  Polyethyl Glycol-Propyl Glycol (SYSTANE) 0.4-0.3 % SOLN Place 1 drop into both eyes 2 (two) times daily as needed (dry eyes).     [provider]  tiZANidine (ZANAFLEX) 4 MG tablet TAKE 1 TABLET  4 MG TOTAL  BY MOUTH EVERY 12  TWELVE  HOURS AS NEEDED FOR MUSCLE SPASMS 07/01/17   [provider]  traMADol (ULTRAM) 50 MG tablet TAKE 1 TABLET BY MOUTH EVERY 12 HOURS AS NEEDED FOR MODERATE PAIN 06/02/17   [provider]    Physical Exam: BP (!) 189/86   Pulse 96   Temp 97.9 F (36.6 C) (Rectal)   Resp 14   Wt 70.8 kg (156 lb)   LMP  (LMP Unknown)   SpO2 100%   BMI 27.63 kg/m   General:  Confused, not following command Eyes: PERRL ENT: unremarkable Neck: supple, no JVD Cardiovascular:  sinus tachycardia in low 100's Respiratory: diminished at basis, no wheezing, no rhonchi, no rales Abdomen: soft/NT/ND, positive bowel sounds Skin: no rash Musculoskeletal:  No edema Psychiatric: confused Neurologic: confused, moving all extremities          Labs on Admission:  Basic Metabolic Panel: Recent Labs  Lab  09/01/17 1528 09/01/17 1605  NA 131* 133*  K 3.4* 3.6  CL 92* 93*  CO2 25  --   GLUCOSE 163* 169*  BUN 22* 26*  CREATININE 1.75* 1.50*  CALCIUM 9.4  --    Liver Function Tests: Recent Labs  Lab 09/01/17 1528  AST 34  ALT 22  ALKPHOS 76  BILITOT 0.9  PROT 7.7  ALBUMIN 4.3   No results for input(s): LIPASE, AMYLASE in the last 168 hours. No results for input(s): AMMONIA in the last 168 hours. CBC: Recent Labs  Lab 09/01/17 1528 09/01/17 1605  WBC 6.3  --   NEUTROABS 4.6  --   HGB 12.9 14.3  HCT 36.2 42.0  MCV 81.0  --  PLT 240  --    Cardiac Enzymes: No results for input(s): CKTOTAL, CKMB, CKMBINDEX, TROPONINI in the last 168 hours.  BNP (last 3 results) No results for input(s): BNP in the last 8760 hours.  ProBNP (last 3 results) No results for input(s): PROBNP in the last 8760 hours.  CBG: Recent Labs  Lab 09/01/17 1538  GLUCAP 167*    Radiological Exams on Admission: Dg Chest 2 View  Result Date: 09/01/2017 CLINICAL DATA:  Severe headache.  Cough and wheezing. EXAM: CHEST  2 VIEW COMPARISON:  07/02/2017. FINDINGS: Mediastinum and hilar structures normal. Mild left base atelectasis/infiltrate. No pleural effusion or pneumothorax. Heart size normal. Thoracic spine scoliosis and degenerative change. IMPRESSION: Mild left base atelectasis/infiltrate. Electronically Signed   By: Marcello Moores  Register   On: 09/01/2017 14:03   Ct Head Wo Contrast  Result Date: 09/01/2017 CLINICAL DATA:  Severe headache for several days EXAM: CT HEAD WITHOUT CONTRAST TECHNIQUE: Contiguous axial images were obtained from the base of the skull through the vertex without intravenous contrast. COMPARISON:  12/22/2012 FINDINGS: Brain: No evidence of acute infarction, hemorrhage, hydrocephalus, extra-axial collection or mass lesion/mass effect. Vascular: No hyperdense vessel or unexpected calcification. Skull: Normal. Negative for fracture or focal lesion. Sinuses/Orbits: No acute finding.  Other: None. IMPRESSION: No acute intracranial abnormality noted. Electronically Signed   By: Inez Catalina M.D.   On: 09/01/2017 16:32      Assessment/Plan Present on Admission: **None**   Metabolic encephalopathy/headache uds negative From elevated blood pressure? From infection? Pneumonia? cxr concerning for mild left lower lobe infiltrates/atelectasisua unremarkable,  Respiratory viral panel ordered, pending collection Blood culture ordered, pending collection. Urine streppneumo antigen ordered, pending collection. Ct head no acute finding, MRI brain no acute findings, does show "Scattered mucosal thickening throughout the paranasal sinuses. No air-fluid level to suggest acute sinusitis. No mastoid effusion" Will continue rocephin/zitrhomax for now. Continue nasonex, mucinex.  Lactic acidosis:  From metformin?  from infection? Continue hydration, cycle lactic acid  Cough:  -With h/o acid reflux, allergic rhinitis and diastolic chf -cxr concerns for pneumonia,  -Clinically does not appear fluids overloaded, cxr on cardiomegaly -Will continue rocephin/zitrhomax for now. Continue nasonex, mucinex. Start iv protonix, Will get echocardiogram.  Headache:  -Ct /mri no acute intracranial findings, does show "Scattered mucosal thickening throughout the paranasal sinuses. No air-fluid level to suggest acute sinusitis. No mastoid effusion" -blood pressure control, avoid imdur for now.   HTN; bp elevated Continue verapamil, add prn hydralazine Hold imdur for now due to headache  Non-insulin-dependent type 2 diabetes Recent a1c 8.3  Start ssi here, hold home oral meds Carb modified diet    CKDIII: Cr at baseline, renal dosing meds   DVT prophylaxis: lovenox  Consultants: none  Code Status: full   Family Communication:  Patient and daughter at bedside  Disposition Plan: admit to med tele  Time spent: 50mins  Florencia Reasons MD, PhD [facility] Pager [phone] If  7PM-7AM, please contact night-coverage at [email or web address], password [facility]

## 2017-09-01 NOTE — ED Triage Notes (Signed)
Pt comes in with complaints of a "severe" headache over the past few days. Also endorses cough.  Pt very tearful and anxious on arrival and continued to state she is choking.  Pt oxygenating at 100% and able to speak in full clear sentences.  Some wheezing noted.  No hx of migraine. Afebrile at home and on assessment.  Complains that the headache is posterior in nature radiating up.

## 2017-09-01 NOTE — ED Notes (Signed)
Patient was in the Lobby in a wheelchair and walked to the triage entrance beating on the door. Patient was escorted back to her wheelchair. Patient was brought to [facility] [place]. After the patient was placed and assisted to the stretcher, she began demanding food. Explained to the patient that she could not have food until seen by a doctor and that they approved for her to have food. Patient's daughter stated that she was going to get her mother some food and left out of the ED.

## 2017-09-01 NOTE — ED Notes (Signed)
Patient transported to X-ray 

## 2017-09-01 NOTE — ED Provider Notes (Signed)
[facility] DEPT Provider Note   CSN: [id number] Arrival date & time: 09/01/17  1249     History   Chief Complaint Chief Complaint  Patient presents with  . Migraine  . Cough    HPI Paula [name] is a 72 y.o. female who presents today for evaluation of cough and headache.  Her daughter reports that her head has been hurting for 4-5 days.  It appears to come and go.  She was ok at around 11am this morning but after that started having a headache.  Her daughter reports that she has an episode about an hour PTA where the left side of her face didn't move.  This lasted "a few minutes" and resolved.  She had slurred speech during this episode.  Her daughter reports that she has a "strong" personality normally but is having behavior changes.  She also reports chronic cough that is unchanged.  Her head hurts more when she coughs.  Her headache feels like a pressure.  Her daughter reports that patient speaks multiple languages and switches between them during interview making assessment difficult.    She has been taking her medications as rx.    HPI  Past Medical History:  Diagnosis Date  . Allergic rhinitis   . Arthritis   . Cough   . Diabetes mellitus   . Diastolic CHF, chronic ([place]) 10/11/2011  . GERD (gastroesophageal reflux disease)   . History of colon polyps 2012   tubular adenoma   . Hyperlipidemia   . Hypertension   . Neuropathy 10/11/2011  . PONV (postoperative nausea and vomiting)    trouble waking up    Patient Active Problem List   Diagnosis Date Noted  . Confusion 09/01/2017  . Allergic rhinitis 08/20/2017  . Chronic pain 05/07/2017  . Palpitations 07/10/2016  . Insomnia 03/25/2016  . Gout 01/23/2016  . History of cataract 02/27/2015  . Seasonal and perennial allergic rhinitis 02/15/2012  . Cough 11/04/2011  . Diabetic neuropathy ([place]) 10/11/2011  . Diastolic CHF, chronic ([place]) 10/11/2011  . CKD (chronic kidney disease) stage  3, GFR 30-59 ml/min ([facility]) 10/10/2011  . Type 2 diabetes mellitus ([place]) 07/03/2010  . Hyperlipidemia 07/03/2010  . Essential hypertension 07/03/2010    Past Surgical History:  Procedure Laterality Date  . CATARACT EXTRACTION     right eye  . lymphatic mass surgery    . NASAL TURBINATE REDUCTION      OB History    Gravida Para Term Preterm AB Living   5 4 4   1 4    SAB TAB Ectopic Multiple Live Births     1             Home Medications    Prior to Admission medications   Medication Sig Start Date End Date Taking? Authorizing Provider  albuterol (PROVENTIL HFA;VENTOLIN HFA) 108 (90 Base) MCG/ACT inhaler Inhale 2 puffs into the lungs every 6 (six) hours as needed. Shortness of breath 06/27/16  Yes Arnoldo Morale, MD  aspirin 81 MG tablet Take 81 mg by mouth daily.     Yes [provider]  calcium carbonate (OSCAL) 1500 (600 Ca) MG TABS tablet Take 1,500 mg by mouth daily with breakfast.   Yes [provider]  cetirizine (ZYRTEC) 10 MG tablet Take 1 tablet (10 mg total) by mouth daily. 08/20/17  Yes Arnoldo Morale, MD  glipiZIDE (GLUCOTROL) 10 MG tablet Take 1 tablet (10 mg total) by mouth 2 (two) times daily before a  meal. 08/20/17  Yes Arnoldo Morale, MD  isosorbide mononitrate (IMDUR) 60 MG 24 hr tablet Take 1 tablet (60 mg total) by mouth daily. 08/20/17  Yes Arnoldo Morale, MD  Melatonin 10 MG TABS Take 1 tablet by mouth at bedtime.   Yes [provider]  pantoprazole (PROTONIX) 40 MG tablet Take 1 tablet (40 mg total) by mouth daily. Patient taking differently: Take 40 mg by mouth daily as needed (heartburn).  07/14/17  Yes Collene Gobble, MD  pseudoephedrine-guaifenesin (MUCINEX D) 60-600 MG 12 hr tablet Take 1 tablet by mouth every 12 (twelve) hours as needed for congestion.   Yes [provider]  rosuvastatin (CRESTOR) 5 MG tablet Take 1 tablet (5 mg total) by mouth daily. 08/20/17  Yes Arnoldo Morale, MD  verapamil (CALAN) 80 MG tablet Take 1  tablet (80 mg total) by mouth 2 (two) times daily. 08/20/17  Yes Arnoldo Morale, MD  GLIPIZIDE XL 10 MG 24 hr tablet Take 10 mg by mouth daily with breakfast. 07/27/17   [provider]  glucose blood (TRUETEST TEST) test strip USE TO CHECK BLOOD AS DIRECTED THREE TIMES DAILY Patient not taking: Reported on 08/20/2017 07/28/17   Arnoldo Morale, MD  mometasone (NASONEX) 50 MCG/ACT nasal spray Place 2 sprays into the nose daily as needed (allergies).  08/13/17   [provider]  Polyethyl Glycol-Propyl Glycol (SYSTANE) 0.4-0.3 % SOLN Place 1 drop into both eyes 2 (two) times daily as needed (dry eyes).     [provider]  tiZANidine (ZANAFLEX) 4 MG tablet TAKE 1 TABLET  4 MG TOTAL  BY MOUTH EVERY 12  TWELVE  HOURS AS NEEDED FOR MUSCLE SPASMS 07/01/17   [provider]  traMADol (ULTRAM) 50 MG tablet TAKE 1 TABLET BY MOUTH EVERY 12 HOURS AS NEEDED FOR MODERATE PAIN 06/02/17   [provider]    Family History Family History  Problem Relation Age of Onset  . Hypertension Mother   . Asthma Sister   . Colon cancer Neg Hx     Social History Social History   Tobacco Use  . Smoking status: Never Smoker  . Smokeless tobacco: Never Used  Substance Use Topics  . Alcohol use: No  . Drug use: No     Allergies   Dust mite extract; Ace inhibitors; and Lovastatin   Review of Systems Review of Systems  Constitutional: Negative for chills and fever.  HENT: Negative for ear pain and sore throat.   Eyes: Negative for pain and visual disturbance.  Respiratory: Negative for cough and shortness of breath.   Cardiovascular: Negative for chest pain and palpitations.  Gastrointestinal: Negative for abdominal pain and vomiting.  Genitourinary: Negative for dysuria and hematuria.  Musculoskeletal: Negative for arthralgias and back pain.  Skin: Negative for color change and rash.  Neurological: Positive for facial asymmetry, speech difficulty and headaches.  Negative for seizures, syncope and weakness.  Psychiatric/Behavioral: Positive for confusion.  All other systems reviewed and are negative.    Physical Exam Updated Vital Signs BP (!) 195/97   Pulse 88   Temp 97.9 F (36.6 C) (Rectal)   Resp 15   Wt 70.8 kg (156 lb)   LMP  (LMP Unknown)   SpO2 100%   BMI 27.63 kg/m   Physical Exam  Constitutional: She appears well-developed and well-nourished. No distress.  HENT:  Head: Normocephalic and atraumatic.  Eyes: Conjunctivae are normal.  Neck: Normal range of motion. Neck supple.  Cardiovascular: Normal rate, regular rhythm  and intact distal pulses.  No murmur heard. Pulmonary/Chest: Effort normal. No respiratory distress. She has rhonchi in the left lower field.  Abdominal: Soft. Bowel sounds are normal. She exhibits no distension. There is no tenderness.  Musculoskeletal: She exhibits no edema.  Neurological: She is alert.  Neuro exam limited secondary to patient AMS.  Mental status appears to wax and wane, patient remains alert, but orientation varries from x3 to x1 (person only).    Skin: Skin is warm and dry.  Nursing note and vitals reviewed.    ED Treatments / Results  Labs (all labs ordered are listed, but only abnormal results are displayed) Labs Reviewed  COMPREHENSIVE METABOLIC PANEL - Abnormal; Notable for the following components:      Result Value   Sodium 131 (*)    Potassium 3.4 (*)    Chloride 92 (*)    Glucose, Bld 163 (*)    BUN 22 (*)    Creatinine, Ser 1.75 (*)    GFR calc non Af Amer 28 (*)    GFR calc Af Amer 33 (*)    All other components within normal limits  URINALYSIS, ROUTINE W REFLEX MICROSCOPIC - Abnormal; Notable for the following components:   Color, Urine STRAW (*)    Glucose, UA 150 (*)    Ketones, ur 5 (*)    Protein, ur 100 (*)    Squamous Epithelial / LPF 0-5 (*)    All other components within normal limits  I-STAT CG4 LACTIC ACID, ED - Abnormal; Notable for the following  components:   Lactic Acid, Venous 3.97 (*)    All other components within normal limits  CBG MONITORING, ED - Abnormal; Notable for the following components:   Glucose-Capillary 167 (*)    All other components within normal limits  I-STAT CHEM 8, ED - Abnormal; Notable for the following components:   Sodium 133 (*)    Chloride 93 (*)    BUN 26 (*)    Creatinine, Ser 1.50 (*)    Glucose, Bld 169 (*)    Calcium, Ion 1.04 (*)    All other components within normal limits  I-STAT CG4 LACTIC ACID, ED - Abnormal; Notable for the following components:   Lactic Acid, Venous 3.48 (*)    All other components within normal limits  RESPIRATORY PANEL BY PCR  CULTURE, BLOOD (ROUTINE X 2)  CULTURE, BLOOD (ROUTINE X 2)  CBC WITH DIFFERENTIAL/PLATELET  ETHANOL  PROTIME-INR  APTT  RAPID URINE DRUG SCREEN, HOSP PERFORMED  COOXEMETRY PANEL  ACETAMINOPHEN LEVEL  SALICYLATE LEVEL  I-STAT TROPONIN, ED    EKG  EKG Interpretation  Date/Time:  Tuesday September 01 2017 16:12:40 EST Ventricular Rate:  77 PR Interval:    QRS Duration: 86 QT Interval:  429 QTC Calculation: 486 R Axis:   4 Text Interpretation:  Sinus rhythm Borderline prolonged PR interval Borderline T abnormalities, lateral leads Borderline prolonged QT interval No STEMI.  Confirmed by Nanda Quinton [phone]) on 09/01/2017 4:17:52 PM Also confirmed by Nanda Quinton [phone]), editor Philomena Doheny [phone])  on 09/01/2017 4:30:46 PM       Radiology Dg Chest 2 View  Result Date: 09/01/2017 CLINICAL DATA:  Severe headache.  Cough and wheezing. EXAM: CHEST  2 VIEW COMPARISON:  07/02/2017. FINDINGS: Mediastinum and hilar structures normal. Mild left base atelectasis/infiltrate. No pleural effusion or pneumothorax. Heart size normal. Thoracic spine scoliosis and degenerative change. IMPRESSION: Mild left base atelectasis/infiltrate. Electronically Signed   By: Marcello Moores  Register  On: 09/01/2017 14:03   Ct Head Wo Contrast  Result Date:  09/01/2017 CLINICAL DATA:  Severe headache for several days EXAM: CT HEAD WITHOUT CONTRAST TECHNIQUE: Contiguous axial images were obtained from the base of the skull through the vertex without intravenous contrast. COMPARISON:  12/22/2012 FINDINGS: Brain: No evidence of acute infarction, hemorrhage, hydrocephalus, extra-axial collection or mass lesion/mass effect. Vascular: No hyperdense vessel or unexpected calcification. Skull: Normal. Negative for fracture or focal lesion. Sinuses/Orbits: No acute finding. Other: None. IMPRESSION: No acute intracranial abnormality noted. Electronically Signed   By: Inez Catalina M.D.   On: 09/01/2017 16:32    Procedures Procedures (including critical care time)  Medications Ordered in ED Medications  hydrALAZINE (APRESOLINE) injection 10 mg (not administered)  pantoprazole (PROTONIX) injection 40 mg (not administered)  fluticasone (FLONASE) 50 MCG/ACT nasal spray 2 spray (not administered)  ketorolac (TORADOL) 15 MG/ML injection 7.5 mg (not administered)  acetaminophen (TYLENOL) tablet 650 mg (650 mg Oral Given 09/01/17 1314)  cefTRIAXone (ROCEPHIN) 1 g in dextrose 5 % 50 mL IVPB (0 g Intravenous Stopped 09/01/17 1630)  azithromycin (ZITHROMAX) 500 mg in dextrose 5 % 250 mL IVPB (0 mg Intravenous Stopped 09/01/17 1750)  sodium chloride 0.9 % bolus 1,000 mL (0 mLs Intravenous Stopped 09/01/17 1750)  hydrALAZINE (APRESOLINE) injection 10 mg (10 mg Intravenous Given 09/01/17 2008)     Initial Impression / Assessment and Plan / ED Course  I have reviewed the triage vital signs and the nursing notes.  Pertinent labs & imaging results that were available during my care of the patient were reviewed by me and considered in my medical decision making (see chart for details).  Clinical Course as of Sep 01 2008  Tue Sep 01, 2017  1611 When initially assessed patient she was able to tell me name, location (hospital) and that it was January.  Now is unable to tell  location and thinks its December.   [EH]  [place] with hospitalist who requests I add on a respiratory viral panel, states they will admit to telemetry.  [EH]    Clinical Course User Index [EH] Lorin Glass, PA-C   [place] presents today for evaluation of headache and cough.  She was found to have altered mental status and that she is oriented to person, not place or time, and this changes frequently, patients daughter attributes this to her mother being irritated by all the questions and not wanting to answer them.  Patient did not meet SIRS criteria, not febrile, tachycardic, tachypneic, and normal white count. Other causes for patient's lactic acidosis were considered, orders placed for cooxemetry panel but low suspicion for CO poisoning as she lives in an apartment and no one else is sick.   Patient does not take metformin, denies use of asa or apap.  Patient denies alcohol use, unsure why patient has isolated lactic acid with out other physical signs of infection/sirs.  CURB-65 score 3 (elevated BUN, age, and confusion).    She did have a headache and reported facial droop earlier in the day which was not present on my exam, CT head performed with out acute abnormality.  As patient requires admission for confusion and pneumonia tx will defer additional work up to inpatient team.    This patient was seen as a shared visit with Dr. Leonette Monarch who evaluated the patient and agreed with my plan.   Final Clinical Impressions(s) / ED Diagnoses   Final diagnoses:  Confusion  Community acquired pneumonia of  left lower lobe of lung [facility])    ED Discharge Orders    None       Ollen Gross 09/02/17 0144    Fatima Blank, MD 09/03/17 [phone]

## 2017-09-01 NOTE — ED Notes (Signed)
ED Provider at bedside. 

## 2017-09-02 ENCOUNTER — Inpatient Hospital Stay (HOSPITAL_COMMUNITY): Payer: BLUE CROSS/BLUE SHIELD

## 2017-09-02 ENCOUNTER — Other Ambulatory Visit: Payer: Self-pay

## 2017-09-02 DIAGNOSIS — J069 Acute upper respiratory infection, unspecified: Secondary | ICD-10-CM

## 2017-09-02 DIAGNOSIS — E119 Type 2 diabetes mellitus without complications: Secondary | ICD-10-CM

## 2017-09-02 DIAGNOSIS — R29810 Facial weakness: Secondary | ICD-10-CM

## 2017-09-02 DIAGNOSIS — I10 Essential (primary) hypertension: Secondary | ICD-10-CM

## 2017-09-02 DIAGNOSIS — I509 Heart failure, unspecified: Secondary | ICD-10-CM

## 2017-09-02 DIAGNOSIS — G9341 Metabolic encephalopathy: Secondary | ICD-10-CM

## 2017-09-02 DIAGNOSIS — N183 Chronic kidney disease, stage 3 (moderate): Secondary | ICD-10-CM

## 2017-09-02 HISTORY — DX: Metabolic encephalopathy: G93.41

## 2017-09-02 LAB — RESPIRATORY PANEL BY PCR

## 2017-09-02 LAB — CBC
HCT: 35.5 % — ABNORMAL LOW (ref 36.0–46.0)
Hemoglobin: 12.7 g/dL (ref 12.0–15.0)
MCH: 28.8 pg (ref 26.0–34.0)
MCHC: 35.8 g/dL (ref 30.0–36.0)
MCV: 80.5 fL (ref 78.0–100.0)
Platelets: 215 10*3/uL (ref 150–400)
RBC: 4.41 MIL/uL (ref 3.87–5.11)
RDW: 13.1 % (ref 11.5–15.5)
WBC: 9.4 10*3/uL (ref 4.0–10.5)

## 2017-09-02 LAB — COMPREHENSIVE METABOLIC PANEL
ALT: 19 U/L (ref 14–54)
AST: 25 U/L (ref 15–41)
Albumin: 3.5 g/dL (ref 3.5–5.0)
Alkaline Phosphatase: 74 U/L (ref 38–126)
Anion gap: 10 (ref 5–15)
BUN: 20 mg/dL (ref 6–20)
CO2: 26 mmol/L (ref 22–32)
Calcium: 8.9 mg/dL (ref 8.9–10.3)
Chloride: 101 mmol/L (ref 101–111)
Creatinine, Ser: 1.56 mg/dL — ABNORMAL HIGH (ref 0.44–1.00)
GFR calc Af Amer: 38 mL/min — ABNORMAL LOW (ref >60)
GFR calc non Af Amer: 33 mL/min — ABNORMAL LOW (ref >60)
Glucose, Bld: 202 mg/dL — ABNORMAL HIGH (ref 65–99)
Potassium: 2.9 mmol/L — ABNORMAL LOW (ref 3.5–5.1)
Sodium: 137 mmol/L (ref 135–145)
Total Bilirubin: 0.6 mg/dL (ref 0.3–1.2)
Total Protein: 6.9 g/dL (ref 6.5–8.1)

## 2017-09-02 LAB — CREATININE, SERUM
Creatinine, Ser: 1.41 mg/dL — ABNORMAL HIGH (ref 0.44–1.00)
GFR calc Af Amer: 43 mL/min — ABNORMAL LOW (ref >60)
GFR calc non Af Amer: 37 mL/min — ABNORMAL LOW (ref >60)

## 2017-09-02 LAB — SALICYLATE LEVEL: Salicylate Lvl: <7 mg/dL (ref 2.8–30.0)

## 2017-09-02 LAB — LACTIC ACID, PLASMA
Lactic Acid, Venous: 1.7 mmol/L (ref 0.5–1.9)
Lactic Acid, Venous: 1.7 mmol/L (ref 0.5–1.9)

## 2017-09-02 LAB — STREP PNEUMONIAE URINARY ANTIGEN: Strep Pneumo Urinary Antigen: NEGATIVE

## 2017-09-02 LAB — ECHOCARDIOGRAM COMPLETE
Height: 63 in
Weight: 2408 oz

## 2017-09-02 LAB — MAGNESIUM: Magnesium: 1.8 mg/dL (ref 1.7–2.4)

## 2017-09-02 LAB — GLUCOSE, CAPILLARY
Glucose-Capillary: 141 mg/dL — ABNORMAL HIGH (ref 65–99)
Glucose-Capillary: 203 mg/dL — ABNORMAL HIGH (ref 65–99)
Glucose-Capillary: 149 mg/dL — ABNORMAL HIGH (ref 65–99)
Glucose-Capillary: 244 mg/dL — ABNORMAL HIGH (ref 65–99)

## 2017-09-02 LAB — ACETAMINOPHEN LEVEL: Acetaminophen (Tylenol), Serum: <10 ug/mL — ABNORMAL LOW (ref 10–30)

## 2017-09-02 MED ORDER — GUAIFENESIN 100 MG/5ML PO SOLN
5.0000 mL | ORAL | Status: DC | PRN
  Administered 2017-09-02: 100 mg via ORAL
  Filled 2017-09-02: qty 10

## 2017-09-02 MED ORDER — POTASSIUM CHLORIDE CRYS ER 20 MEQ PO TBCR
40.0000 meq | EXTENDED_RELEASE_TABLET | ORAL | Status: AC
  Administered 2017-09-02 (×3): 40 meq via ORAL
  Filled 2017-09-02 (×3): qty 2

## 2017-09-02 MED ORDER — LORATADINE 10 MG PO TABS
10.0000 mg | ORAL_TABLET | Freq: Every day | ORAL | Status: DC
  Administered 2017-09-02 – 2017-09-03 (×2): 10 mg via ORAL
  Filled 2017-09-02 (×2): qty 1

## 2017-09-02 MED ORDER — METOPROLOL TARTRATE 5 MG/5ML IV SOLN
5.0000 mg | INTRAVENOUS | Status: AC
Start: 2017-09-02 — End: 2017-09-02
  Administered 2017-09-02: 5 mg via INTRAVENOUS

## 2017-09-02 MED ORDER — CALCIUM CARBONATE 1250 (500 CA) MG PO TABS
1250.0000 mg | ORAL_TABLET | Freq: Every day | ORAL | Status: DC
  Administered 2017-09-02 – 2017-09-03 (×2): 1250 mg via ORAL
  Filled 2017-09-02 (×2): qty 1

## 2017-09-02 MED ORDER — TRAMADOL HCL 50 MG PO TABS
50.0000 mg | ORAL_TABLET | Freq: Two times a day (BID) | ORAL | Status: DC | PRN
  Filled 2017-09-02: qty 1

## 2017-09-02 MED ORDER — ENOXAPARIN SODIUM 30 MG/0.3ML ~~LOC~~ SOLN
30.0000 mg | Freq: Every day | SUBCUTANEOUS | Status: DC
  Filled 2017-09-02 (×2): qty 0.3

## 2017-09-02 MED ORDER — VERAPAMIL HCL 80 MG PO TABS
80.0000 mg | ORAL_TABLET | Freq: Two times a day (BID) | ORAL | Status: DC
Start: 2017-09-02 — End: 2017-09-03
  Administered 2017-09-02 – 2017-09-03 (×4): 80 mg via ORAL
  Filled 2017-09-02 (×4): qty 1

## 2017-09-02 MED ORDER — INSULIN ASPART 100 UNIT/ML ~~LOC~~ SOLN: 0.0000 [IU] | Freq: Every day | SUBCUTANEOUS | Status: DC

## 2017-09-02 MED ORDER — POLYVINYL ALCOHOL 1.4 % OP SOLN: 1.0000 [drp] | Freq: Two times a day (BID) | OPHTHALMIC | Status: DC | PRN

## 2017-09-02 MED ORDER — FLUTICASONE PROPIONATE 50 MCG/ACT NA SUSP
2.0000 | Freq: Every day | NASAL | Status: DC
  Filled 2017-09-02: qty 16

## 2017-09-02 MED ORDER — METOPROLOL TARTRATE 5 MG/5ML IV SOLN
5.0000 mg | Freq: Three times a day (TID) | INTRAVENOUS | Status: DC | PRN
  Filled 2017-09-02: qty 5

## 2017-09-02 MED ORDER — DEXTROSE 5 % IV SOLN
1.0000 g | INTRAVENOUS | Status: DC
  Administered 2017-09-02: 1 g via INTRAVENOUS
  Filled 2017-09-02: qty 10

## 2017-09-02 MED ORDER — ALBUTEROL SULFATE (2.5 MG/3ML) 0.083% IN NEBU
3.0000 mL | INHALATION_SOLUTION | Freq: Four times a day (QID) | RESPIRATORY_TRACT | Status: DC | PRN
  Administered 2017-09-02: 3 mL via RESPIRATORY_TRACT
  Filled 2017-09-02: qty 3

## 2017-09-02 MED ORDER — AZITHROMYCIN 500 MG IV SOLR
500.0000 mg | INTRAVENOUS | Status: DC
  Filled 2017-09-02: qty 500

## 2017-09-02 MED ORDER — ASPIRIN EC 81 MG PO TBEC
81.0000 mg | DELAYED_RELEASE_TABLET | Freq: Every day | ORAL | Status: DC
  Administered 2017-09-02 – 2017-09-03 (×2): 81 mg via ORAL
  Filled 2017-09-02 (×2): qty 1

## 2017-09-02 MED ORDER — INSULIN ASPART 100 UNIT/ML ~~LOC~~ SOLN: 0.0000 [IU] | Freq: Three times a day (TID) | SUBCUTANEOUS | Status: DC

## 2017-09-02 MED ORDER — ROSUVASTATIN CALCIUM 5 MG PO TABS
5.0000 mg | ORAL_TABLET | Freq: Every day | ORAL | Status: DC
  Administered 2017-09-02: 5 mg via ORAL
  Filled 2017-09-02: qty 1

## 2017-09-02 NOTE — ED Notes (Signed)
meds sent via tube station to floor 1400

## 2017-09-02 NOTE — Progress Notes (Signed)
PROGRESS NOTE  ALDONIA KEEVEN JQB:[id number] DOB: 10-16-1945 DOA: 09/01/2017 PCP: Arnoldo Morale, MD  Brief Narrative: 26yow PMH DM prsented with cough, headache, confusion, brief episode of slurred speech and facial droop which resolved prior to presentation. Admitted for cough, metabolic encephalopathy, possible pneumonia.   Assessment/Plan Acute metabolic encephalopathy - etiology unclear, but appears resolved  Reported facial weakness, difficulty speaking, confusion prior to presentation. No focal deficits on presentation. - MRI brain negative. History suggests possible TIA with HA and reported high blood pressure. - check MRA brain, carotid u/s - echo was unremarkable  URI. Respiratory panel positive for Coronavirus.  - no significant infiltrate on CXR. D/c abx.  Headache  - resolved, etiology unclear   Hyponatremia, resolved, likely dehydration  DM type 2 - CBG stable  CKD stage III - at baseline  Essential HTN - stable   Much better. Complete carotid u/s and MRA head. If better likley home 1/24  DVT prophylaxis: enoxaparin Code Status: full Family Communication: daughter at bedside Disposition Plan: home    Murray Hodgkins, MD  [facility] Direct contact: [phone] --[place]ia [place]  --[email or web address]; password [email or web address]  7PM-7AM contact night coverage as above 09/02/2017, 6:42 PM  LOS: 1 day   Consultants:    Procedures:  Echo Study Conclusions  - Left ventricle: The cavity size was normal. Systolic function was   vigorous. The estimated ejection fraction was in the range of 65%   to 70%. Wall motion was normal; there were no regional wall   motion abnormalities. Doppler parameters are consistent with   abnormal left ventricular relaxation (grade 1 diastolic   dysfunction). - Aortic valve: There was trivial regurgitation. - Mitral valve: There was trivial regurgitation.  Antimicrobials:  Azithromycin 1/22  Ceftriaxone 1/22 >>  1/23  Interval history/Subjective: Feels much better, no headache, breathing fine, eating fine. No neuro complaints.  Objective: Vitals:  Vitals:   09/02/17 0930 09/02/17 1500  BP: (!) 148/72 (!) 150/70  Pulse:  98  Resp:  20  Temp:  98.9 F (37.2 C)  SpO2:  100%    Exam:  Constitutional:  . Appears calm and comfortable Eyes:  . pupils and irises appear normal . Normal lids ENMT:  . grossly normal hearing  . Lips appear normal Respiratory:  . CTA bilaterally, no w/r/r.  . Respiratory effort normal.  Cardiovascular:  . RRR, no m/r/g . No LE extremity edema   Musculoskeletal:  . Digits/nails BUE: no clubbing, cyanosis, petechiae, infection . exam of joints, bones, muscles of at least one of following: head/neck, RUE, LUE, RLE, LLE   o strength and tone normal, no atrophy, no abnormal movements Neurologic:  . CN 2-12 intact Psychiatric:  . Mental status o Mood, affect appropriate o Orientation to person, WL, month, year  . judgement and insight appear normal     I have personally reviewed the following:   Labs:  Potassium 2.9  Creatinine at baseline  LFTs unremarkable  Imaging studies:  MRI brain negative   CXR NAD  Medical tests:  EKG SR    Scheduled Meds: . aspirin EC  81 mg Oral Daily  . calcium carbonate  1,250 mg Oral Q breakfast  . enoxaparin (LOVENOX) injection  30 mg Subcutaneous QHS  . fluticasone  2 spray Each Nare Daily  . insulin aspart  0-5 Units Subcutaneous QHS  . insulin aspart  0-9 Units Subcutaneous TID WC  . loratadine  10 mg Oral Daily  . pantoprazole (PROTONIX) IV  40  mg Intravenous Q12H  . potassium chloride  40 mEq Oral Q4H  . rosuvastatin  5 mg Oral q1800  . verapamil  80 mg Oral BID   Continuous Infusions:   Active Problems:   Type 2 diabetes mellitus (HCC)   CKD (chronic kidney disease) stage 3, GFR 30-59 ml/min ([facility])   Acute metabolic encephalopathy   URI (upper respiratory infection)   Facial weakness    Benign essential HTN   LOS: 1 day

## 2017-09-02 NOTE — Progress Notes (Signed)
  Echocardiogram 2D Echocardiogram has been performed.  Paula [name] L Androw 09/02/2017, 10:44 AM

## 2017-09-02 NOTE — ED Notes (Signed)
ED TO INPATIENT HANDOFF REPORT  Name/Age/Gender Paula [name] 72 y.o. female  Code Status Code Status History    Date Active Date Inactive Code Status Order ID Comments User Context   10/10/2011 21:45 10/11/2011 21:28 Full Code [id number]  Arn Medal, RN ED      Home/SNF/Other Home  Chief Complaint Migraine, Cough, Feels like "choking"  Level of Care/Admitting Diagnosis ED Disposition    ED Disposition Condition [facility] Area: [place] [[id number]]  Level of Care: Telemetry [5]  Admit to tele based on following criteria: Other see comments  Comments: unexplained confusion  Diagnosis: Confusion [[id number]]  Admitting Physician: Florencia Reasons [[id number]]  Attending Physician: Florencia Reasons [[id number]]  Estimated length of stay: past midnight tomorrow  Certification:: I certify this patient will need inpatient services for at least 2 midnights  PT Class (Do Not Modify): Inpatient [101]  PT Acc Code (Do Not Modify): Private [1]       Medical History Past Medical History:  Diagnosis Date  . Allergic rhinitis   . Arthritis   . Cough   . Diabetes mellitus   . Diastolic CHF, chronic ([place]) 10/11/2011  . GERD (gastroesophageal reflux disease)   . History of colon polyps 2012   tubular adenoma   . Hyperlipidemia   . Hypertension   . Neuropathy 10/11/2011  . PONV (postoperative nausea and vomiting)    trouble waking up    Allergies Allergies  Allergen Reactions  . Dust Mite Extract Cough  . Ace Inhibitors Cough    chronic cough  . Lovastatin Other (See Comments)    Generalized body pain    IV Location/Drains/Wounds Patient Lines/Drains/Airways Status   Active Line/Drains/Airways    None          Labs/Imaging Results for orders placed or performed during the hospital encounter of 09/01/17 (from the past 48 hour(s))  CBC with Differential     Status: None   Collection Time: 09/01/17  3:28 PM  Result Value Ref Range   WBC 6.3  4.0 - 10.5 K/uL   RBC 4.47 3.87 - 5.11 MIL/uL   Hemoglobin 12.9 12.0 - 15.0 g/dL   HCT 36.2 36.0 - 46.0 %   MCV 81.0 78.0 - 100.0 fL   MCH 28.9 26.0 - 34.0 pg   MCHC 35.6 30.0 - 36.0 g/dL   RDW 12.9 11.5 - 15.5 %   Platelets 240 150 - 400 K/uL   Neutrophils Relative % 72 %   Neutro Abs 4.6 1.7 - 7.7 K/uL   Lymphocytes Relative 16 %   Lymphs Abs 1.0 0.7 - 4.0 K/uL   Monocytes Relative 10 %   Monocytes Absolute 0.7 0.1 - 1.0 K/uL   Eosinophils Relative 1 %   Eosinophils Absolute 0.0 0.0 - 0.7 K/uL   Basophils Relative 1 %   Basophils Absolute 0.0 0.0 - 0.1 K/uL  Comprehensive metabolic panel     Status: Abnormal   Collection Time: 09/01/17  3:28 PM  Result Value Ref Range   Sodium 131 (L) 135 - 145 mmol/L   Potassium 3.4 (L) 3.5 - 5.1 mmol/L   Chloride 92 (L) 101 - 111 mmol/L   CO2 25 22 - 32 mmol/L   Glucose, Bld 163 (H) 65 - 99 mg/dL   BUN 22 (H) 6 - 20 mg/dL   Creatinine, Ser 1.75 (H) 0.44 - 1.00 mg/dL   Calcium 9.4 8.9 - 10.3 mg/dL   Total Protein 7.7 6.5 -  8.1 g/dL   Albumin 4.3 3.5 - 5.0 g/dL   AST 34 15 - 41 U/L   ALT 22 14 - 54 U/L   Alkaline Phosphatase 76 38 - 126 U/L   Total Bilirubin 0.9 0.3 - 1.2 mg/dL   GFR calc non Af Amer 28 (L) >60 mL/min   GFR calc Af Amer 33 (L) >60 mL/min    Comment: (NOTE) The eGFR has been calculated using the CKD EPI equation. This calculation has not been validated in all clinical situations. eGFR's persistently <60 mL/min signify possible Chronic Kidney Disease.    Anion gap 14 5 - 15  CBG monitoring, ED     Status: Abnormal   Collection Time: 09/01/17  3:38 PM  Result Value Ref Range   Glucose-Capillary 167 (H) 65 - 99 mg/dL  Protime-INR     Status: None   Collection Time: 09/01/17  3:51 PM  Result Value Ref Range   Prothrombin Time 13.2 11.4 - 15.2 seconds   INR 1.01   APTT     Status: None   Collection Time: 09/01/17  3:51 PM  Result Value Ref Range   aPTT 27 24 - 36 seconds  Ethanol     Status: None   Collection  Time: 09/01/17  4:00 PM  Result Value Ref Range   Alcohol, Ethyl (B) <10 <10 mg/dL    Comment:        LOWEST DETECTABLE LIMIT FOR SERUM ALCOHOL IS 10 mg/dL FOR MEDICAL PURPOSES ONLY   I-stat troponin, ED     Status: None   Collection Time: 09/01/17  4:03 PM  Result Value Ref Range   Troponin i, poc 0.00 0.00 - 0.08 ng/mL   Comment 3            Comment: Due to the release kinetics of cTnI, a negative result within the first hours of the onset of symptoms does not rule out myocardial infarction with certainty. If myocardial infarction is still suspected, repeat the test at appropriate intervals.   I-Stat CG4 Lactic Acid, ED     Status: Abnormal   Collection Time: 09/01/17  4:05 PM  Result Value Ref Range   Lactic Acid, Venous 3.97 (HH) 0.5 - 1.9 mmol/L   Comment NOTIFIED PHYSICIAN   I-stat Chem 8, ED     Status: Abnormal   Collection Time: 09/01/17  4:05 PM  Result Value Ref Range   Sodium 133 (L) 135 - 145 mmol/L   Potassium 3.6 3.5 - 5.1 mmol/L   Chloride 93 (L) 101 - 111 mmol/L   BUN 26 (H) 6 - 20 mg/dL   Creatinine, Ser 1.50 (H) 0.44 - 1.00 mg/dL   Glucose, Bld 169 (H) 65 - 99 mg/dL   Calcium, Ion 1.04 (L) 1.15 - 1.40 mmol/L   TCO2 27 22 - 32 mmol/L   Hemoglobin 14.3 12.0 - 15.0 g/dL   HCT 42.0 36.0 - 46.0 %  Urine rapid drug screen (hosp performed)     Status: None   Collection Time: 09/01/17  5:41 PM  Result Value Ref Range   Opiates NONE DETECTED NONE DETECTED   Cocaine NONE DETECTED NONE DETECTED   Benzodiazepines NONE DETECTED NONE DETECTED   Amphetamines NONE DETECTED NONE DETECTED   Tetrahydrocannabinol NONE DETECTED NONE DETECTED   Barbiturates NONE DETECTED NONE DETECTED    Comment: (NOTE) DRUG SCREEN FOR MEDICAL PURPOSES ONLY.  IF CONFIRMATION IS NEEDED FOR ANY PURPOSE, NOTIFY LAB WITHIN 5 DAYS. LOWEST DETECTABLE LIMITS FOR URINE  DRUG SCREEN Drug Class                     Cutoff (ng/mL) Amphetamine and metabolites    1000 Barbiturate and metabolites     200 Benzodiazepine                 494 Tricyclics and metabolites     300 Opiates and metabolites        300 Cocaine and metabolites        300 THC                            50   Urinalysis, Routine w reflex microscopic     Status: Abnormal   Collection Time: 09/01/17  5:41 PM  Result Value Ref Range   Color, Urine STRAW (A) YELLOW   APPearance CLEAR CLEAR   Specific Gravity, Urine 1.006 1.005 - 1.030   pH 8.0 5.0 - 8.0   Glucose, UA 150 (A) NEGATIVE mg/dL   Hgb urine dipstick NEGATIVE NEGATIVE   Bilirubin Urine NEGATIVE NEGATIVE   Ketones, ur 5 (A) NEGATIVE mg/dL   Protein, ur 100 (A) NEGATIVE mg/dL   Nitrite NEGATIVE NEGATIVE   Leukocytes, UA NEGATIVE NEGATIVE   RBC / HPF 0-5 0 - 5 RBC/hpf   WBC, UA 0-5 0 - 5 WBC/hpf   Bacteria, UA NONE SEEN NONE SEEN   Squamous Epithelial / LPF 0-5 (A) NONE SEEN  I-Stat CG4 Lactic Acid, ED     Status: Abnormal   Collection Time: 09/01/17  5:52 PM  Result Value Ref Range   Lactic Acid, Venous 3.48 (HH) 0.5 - 1.9 mmol/L   Comment NOTIFIED PHYSICIAN    Dg Chest 2 View  Result Date: 09/01/2017 CLINICAL DATA:  Severe headache.  Cough and wheezing. EXAM: CHEST  2 VIEW COMPARISON:  07/02/2017. FINDINGS: Mediastinum and hilar structures normal. Mild left base atelectasis/infiltrate. No pleural effusion or pneumothorax. Heart size normal. Thoracic spine scoliosis and degenerative change. IMPRESSION: Mild left base atelectasis/infiltrate. Electronically Signed   By: Marcello Moores  Register   On: 09/01/2017 14:03   Ct Head Wo Contrast  Result Date: 09/01/2017 CLINICAL DATA:  Severe headache for several days EXAM: CT HEAD WITHOUT CONTRAST TECHNIQUE: Contiguous axial images were obtained from the base of the skull through the vertex without intravenous contrast. COMPARISON:  12/22/2012 FINDINGS: Brain: No evidence of acute infarction, hemorrhage, hydrocephalus, extra-axial collection or mass lesion/mass effect. Vascular: No hyperdense vessel or unexpected  calcification. Skull: Normal. Negative for fracture or focal lesion. Sinuses/Orbits: No acute finding. Other: None. IMPRESSION: No acute intracranial abnormality noted. Electronically Signed   By: Inez Catalina M.D.   On: 09/01/2017 16:32   Mr Brain Wo Contrast  Result Date: 09/01/2017 CLINICAL DATA:  Initial evaluation for acute severe headache for several days, altered mental status. EXAM: MRI HEAD WITHOUT CONTRAST TECHNIQUE: Multiplanar, multiecho pulse sequences of the brain and surrounding structures were obtained without intravenous contrast. COMPARISON:  Prior CT from earlier the same day. FINDINGS: Brain: Examination mildly degraded by motion artifact. Generalized age appropriate cerebral atrophy. Patchy and confluent T2/FLAIR hyperintensity within the periventricular and deep white matter both cerebral hemispheres, most consistent with chronic small vessel ischemic changes, mild for age. Single tiny remote lacunar infarct noted within the right thalamus. No abnormal foci of restricted diffusion to suggest acute or subacute ischemia. Gray-white matter differentiation maintained. No evidence for acute or chronic intracranial hemorrhage. No mass lesion,  midline shift or mass effect. No hydrocephalus. No extra-axial fluid collection. Major dural sinuses are grossly patent. Pituitary gland grossly within normal limits. Midline structures intact. The Vascular: Major intracranial vascular flow voids are maintained at the skull base. Skull and upper cervical spine: Craniocervical junction within normal limits. Upper cervical spine normal. Bone marrow signal intensity within normal limits. No scalp soft tissue abnormality. Sinuses/Orbits: Globes and orbital soft tissues within normal limits. Patient status post cataract extraction bilaterally. Scattered mucosal thickening throughout the paranasal sinuses. No air-fluid level to suggest acute sinusitis. No mastoid effusion. Other: None. IMPRESSION: 1. No acute  intracranial abnormality. 2. 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 Electronically Signed   By: Jeannine Boga M.D.   On: 09/01/2017 22:39    Pending Labs Unresulted Labs (From admission, onward)   Start     Ordered   09/02/17 0052  Strep pneumoniae urinary antigen  Once,   R     09/02/17 0051   09/01/17 2147  Lactic acid, plasma  STAT Now then every 3 hours,   R     09/01/17 2146   09/01/17 1939  Culture, blood (routine x 2)  BLOOD CULTURE X 2,   R     09/01/17 1938   09/01/17 1854  Respiratory Panel by PCR  (Respiratory virus panel)  Once,   R     09/01/17 1853   09/01/17 1804  .Cooxemetry Panel (carboxy, met, total hgb, O2 sat)  Once,   R     09/01/17 1803   09/01/17 1804  Acetaminophen level  Once,   STAT     09/01/17 1804   25/05/39 7673  Salicylate level  Once,   STAT     09/01/17 1804      Vitals/Pain Today's Vitals   09/01/17 2330 09/02/17 0000 09/02/17 0015 09/02/17 0047  BP: (!) 170/67 (!) 141/66 (!) 142/72 (!) 192/64  Pulse: (!) 107 (!) 107 (!) 109 (!) 109  Resp: _0 Temp:    100.2 F (37.9 C)  TempSrc:    Oral  SpO2: 95% 98% 96% 100%  Weight:    150 lb 8 oz (68.3 kg)  Height:    _1  (1.6 m)  PainSc:    10-Worst pain ever    Isolation Precautions Droplet precaution  Medications Medications  hydrALAZINE (APRESOLINE) injection 10 mg (not administered)  pantoprazole (PROTONIX) injection 40 mg (not administered)  fluticasone (FLONASE) 50 MCG/ACT nasal spray 2 spray (not administered)  0.9 %  sodium chloride infusion (not administered)  traMADol (ULTRAM) tablet 50 mg (not administered)  albuterol (PROVENTIL HFA;VENTOLIN HFA) 108 (90 Base) MCG/ACT inhaler 2 puff (not administered)  aspirin tablet 81 mg (not administered)  calcium carbonate (OSCAL) tablet 1,500 mg (not administered)  loratadine (CLARITIN) tablet 10 mg (not administered)  fluticasone (FLONASE) 50 MCG/ACT nasal spray 2 spray  (not administered)  Polyethyl Glycol-Propyl Glycol 0.4-0.3 % SOLN 1 drop (not administered)  rosuvastatin (CRESTOR) tablet 5 mg (not administered)  verapamil (CALAN) tablet 80 mg (not administered)  metoprolol tartrate (LOPRESSOR) injection 5 mg (not administered)  metoprolol tartrate (LOPRESSOR) injection 5 mg (not administered)  acetaminophen (TYLENOL) tablet 650 mg (650 mg Oral Given 09/01/17 1314)  cefTRIAXone (ROCEPHIN) 1 g in dextrose 5 % 50 mL IVPB (0 g Intravenous Stopped 09/01/17 1630)  azithromycin (ZITHROMAX) 500 mg in dextrose 5 % 250 mL IVPB (0 mg Intravenous Stopped 09/01/17 1750)  sodium chloride 0.9 % bolus 1,000  mL (0 mLs Intravenous Stopped 09/01/17 1750)  hydrALAZINE (APRESOLINE) injection 10 mg (10 mg Intravenous Given 09/01/17 2008)  ketorolac (TORADOL) 15 MG/ML injection 7.5 mg (7.5 mg Intravenous Given 09/01/17 2023)    Mobility walks

## 2017-09-02 NOTE — ED Notes (Signed)
Pt pulled out IV in her sleep 3 attempts to restart site unsuccessful consult for IV team placed in orders.

## 2017-09-03 ENCOUNTER — Inpatient Hospital Stay (HOSPITAL_COMMUNITY): Payer: BLUE CROSS/BLUE SHIELD

## 2017-09-03 DIAGNOSIS — R410 Disorientation, unspecified: Secondary | ICD-10-CM

## 2017-09-03 DIAGNOSIS — J069 Acute upper respiratory infection, unspecified: Secondary | ICD-10-CM

## 2017-09-03 LAB — BASIC METABOLIC PANEL
Anion gap: 8 (ref 5–15)
BUN: 21 mg/dL — ABNORMAL HIGH (ref 6–20)
CO2: 23 mmol/L (ref 22–32)
Calcium: 9.1 mg/dL (ref 8.9–10.3)
Chloride: 109 mmol/L (ref 101–111)
Creatinine, Ser: 1.52 mg/dL — ABNORMAL HIGH (ref 0.44–1.00)
GFR calc Af Amer: 39 mL/min — ABNORMAL LOW (ref >60)
GFR calc non Af Amer: 34 mL/min — ABNORMAL LOW (ref >60)
Glucose, Bld: 157 mg/dL — ABNORMAL HIGH (ref 65–99)
Potassium: 3.6 mmol/L (ref 3.5–5.1)
Sodium: 140 mmol/L (ref 135–145)

## 2017-09-03 LAB — GLUCOSE, CAPILLARY
Glucose-Capillary: 183 mg/dL — ABNORMAL HIGH (ref 65–99)
Glucose-Capillary: 249 mg/dL — ABNORMAL HIGH (ref 65–99)
Glucose-Capillary: 144 mg/dL — ABNORMAL HIGH (ref 65–99)

## 2017-09-03 LAB — MAGNESIUM: Magnesium: 1.8 mg/dL (ref 1.7–2.4)

## 2017-09-03 MED ORDER — ACETAMINOPHEN 325 MG PO TABS: 650.0000 mg | ORAL_TABLET | Freq: Four times a day (QID) | ORAL | Status: DC | PRN

## 2017-09-03 MED ORDER — ONDANSETRON HCL 4 MG/2ML IJ SOLN: 4.0000 mg | Freq: Four times a day (QID) | INTRAMUSCULAR | Status: DC | PRN

## 2017-09-03 MED ORDER — PANTOPRAZOLE SODIUM 40 MG PO TBEC: 40.0000 mg | DELAYED_RELEASE_TABLET | Freq: Two times a day (BID) | ORAL | Status: DC

## 2017-09-03 NOTE — Progress Notes (Signed)
PHARMACIST - PHYSICIAN COMMUNICATION  DR:   TRH  CONCERNING: IV to Oral Route Change Policy  RECOMMENDATION: This patient is receiving pantoprazole by the intravenous route.  Based on criteria approved by the Pharmacy and Therapeutics Committee, the intravenous medication(s) is/are being converted to the equivalent oral dose form(s).   DESCRIPTION: These criteria include:  The patient is eating (either orally or via tube) and/or has been taking other orally administered medications for a least 24 hours  The patient has no evidence of active gastrointestinal bleeding or impaired GI absorption (gastrectomy, short bowel, patient on TNA or NPO).  If you have questions about this conversion, please contact the Pharmacy Department  []   [phone] )  [facility] []   [phone] )  [facility] []   [phone] )  [facility] []   [phone] )  [facility] [x]   [phone] )  [place], PharmD, BCPS.   Pager: [phone] 09/03/2017 11:37 AM

## 2017-09-03 NOTE — Progress Notes (Signed)
Pt and Pt's Daughter given discharge teaching including medications and schedule. Understanding verbalized. Discharged packet given to Daughter

## 2017-09-03 NOTE — Progress Notes (Signed)
Dr. Sarajane Jews making rounds on the floor and made aware that the Patient is refusing Insulin.

## 2017-09-03 NOTE — Progress Notes (Signed)
Patient c/o left foot pain.  RN will use cold therapy(ice pack)

## 2017-09-03 NOTE — Progress Notes (Signed)
Carotid artery duplex has been completed. 1-39% ICA stenosis bilaterally.  09/03/17 12:23 PM Carlos Levering RVT

## 2017-09-03 NOTE — Discharge Summary (Addendum)
Physician Discharge Summary  Paula [name] [facility] VPX:[id number] DOB: Feb 09, 1946 DOA: 09/01/2017  PCP: Charlott Rakes, MD  Admit date: 09/01/2017 Discharge date: 09/03/2017  Recommendations for Outpatient Follow-up:  1. Incidental finding small supraclinoid aneursym. Discussed with Dr. Estanislado Pandy, will follow-up in outpatient setting, information below. Patient to call as below to set up appointment. Discussed with patient and daughter.  Follow-up Information    Arnoldo Morale, MD. Schedule an appointment as soon as possible for a visit in 1 week(s).   Specialty:  Family Medicine Contact information: [place] [address] [phone]        Luanne Bras, MD. Schedule an appointment as soon as possible for a visit in 2 week(s).   Specialties:  Interventional Radiology, Radiology Why:  [facility] [phone] Contact information: [address] [place] [address] [phone]            Discharge Diagnoses:  2. Acute metabolic encephalopathy 3. URI secondary to Coronavirus.  4. Headache  5. Hyponatremia 6. DM type 2 7. CKD stage III 8. Essential HTN 9. Incidental finding small supraclinoid aneursym  Discharge Condition: improved Disposition: home  Diet recommendation: heart healthy, diabetic diet  Filed Weights   09/01/17 1310 09/02/17 0047 09/03/17 0503  Weight: 70.8 kg (156 lb) 68.3 kg (150 lb 8 oz) 71.2 kg (156 lb 14.4 oz)    History of present illness:  70yow PMH DM prsented with cough, headache, confusion, brief episode of slurred speech and facial droop which resolved prior to presentation. Admitted for cough, metabolic encephalopathy, possible pneumonia.   Hospital Course:  Acute metabolic encephalopathy spontaneously resolved, etiology unclear but extensive investigation was unrevealing. Headache spontaneously resolved as well. Hospitalization was uncomplicated. Individual issues as below.  Acute metabolic  encephalopathy - etiology unclear, but resolved spontaneously  Reported facial weakness, difficulty speaking, confusion prior to presentation. No focal deficits on presentation. - MRI brain, MRA head negative. Carotid u/s and echo unremarkable.  TIA was considered in differential but is doubted at this point given lack of objective findings.   URI. Respiratory panel positive for Coronavirus.  - no significant infiltrate on CXR. No indication for antibiotic.  Headache  - resolved spontaneously, likely secondary to URI  Hyponatremia, resolved, likely dehydration, URI  DM type 2 - CBG remained stable  CKD stage III - at baseline  Essential HTN - stable  Consultants:  None   Procedures:  Echo Study Conclusions  - Left ventricle: The cavity size was normal. Systolic function was vigorous. The estimated ejection fraction was in the range of 65% to 70%. Wall motion was normal; there were no regional wall motion abnormalities. Doppler parameters are consistent with abnormal left ventricular relaxation (grade 1 diastolic dysfunction). - Aortic valve: There was trivial regurgitation. - Mitral valve: There was trivial regurgitation.  Antimicrobials:  Azithromycin 1/22  Ceftriaxone 1/22 >> 1/23  Today's assessment: S: feels fine, some pain in left foot, ready to go home Refusing insulin. O: Vitals:  Vitals:   09/03/17 0942 09/03/17 1316  BP: (!) 150/70 (!) 174/71  Pulse:  89  Resp:  18  Temp:  98 F (36.7 C)  SpO2:  100%    Constitutional:  . Appears calm and comfortable Respiratory:  . CTA bilaterally, no w/r/r.  . Respiratory effort normal.  Cardiovascular:  . RRR, no m/r/g . No LE extremity edema   Neurologic:  . Grossly intact Psychiatric:  . judgement and insight appear normal . Mental status o Mood, affect appropriate  Left foot appears unremarkable  CBG stable BMP stable Mg 1.8 MRA head noted Carotid u/s  noted  Discharge Instructions  Discharge Instructions    Diet - low sodium heart healthy   Complete by:  As directed    Diet Carb Modified   Complete by:  As directed    Discharge instructions   Complete by:  As directed    Call your physician or seek immediate medical attention for pain, confusion, weakness, difficulty speaking or worsening of condition.   Increase activity slowly   Complete by:  As directed      Allergies as of 09/03/2017      Reactions   Dust Mite Extract Cough   Ace Inhibitors Cough   chronic cough   Lovastatin Other (See Comments)   Generalized body pain      Medication List    STOP taking these medications   traMADol 50 MG tablet Commonly known as:  ULTRAM     TAKE these medications   albuterol 108 (90 Base) MCG/ACT inhaler Commonly known as:  PROVENTIL HFA;VENTOLIN HFA Inhale 2 puffs into the lungs every 6 (six) hours as needed. Shortness of breath   aspirin 81 MG tablet Take 81 mg by mouth daily.   calcium carbonate 1500 (600 Ca) MG Tabs tablet Commonly known as:  OSCAL Take 1,500 mg by mouth daily with breakfast.   cetirizine 10 MG tablet Commonly known as:  ZYRTEC Take 1 tablet (10 mg total) by mouth daily.   glipiZIDE 10 MG tablet Commonly known as:  GLUCOTROL Take 1 tablet (10 mg total) by mouth 2 (two) times daily before a meal. What changed:  Another medication with the same name was removed. Continue taking this medication, and follow the directions you see here.   glucose blood test strip Commonly known as:  TRUETEST TEST USE TO CHECK BLOOD AS DIRECTED THREE TIMES DAILY   isosorbide mononitrate 60 MG 24 hr tablet Commonly known as:  IMDUR Take 1 tablet (60 mg total) by mouth daily.   Melatonin 10 MG Tabs Take 1 tablet by mouth at bedtime.   mometasone 50 MCG/ACT nasal spray Commonly known as:  NASONEX Place 2 sprays into the nose daily as needed (allergies).   pantoprazole 40 MG tablet Commonly known as:   PROTONIX Take 1 tablet (40 mg total) by mouth daily. What changed:    when to take this  reasons to take this   pseudoephedrine-guaifenesin 60-600 MG 12 hr tablet Commonly known as:  MUCINEX D Take 1 tablet by mouth every 12 (twelve) hours as needed for congestion.   rosuvastatin 5 MG tablet Commonly known as:  CRESTOR Take 1 tablet (5 mg total) by mouth daily.   SYSTANE 0.4-0.3 % Soln Generic drug:  Polyethyl Glycol-Propyl Glycol Place 1 drop into both eyes 2 (two) times daily as needed (dry eyes).   tiZANidine 4 MG tablet Commonly known as:  ZANAFLEX TAKE 1 TABLET  4 MG TOTAL  BY MOUTH EVERY 12  TWELVE  HOURS AS NEEDED FOR MUSCLE SPASMS   verapamil 80 MG tablet Commonly known as:  CALAN Take 1 tablet (80 mg total) by mouth 2 (two) times daily.      Allergies  Allergen Reactions  . Dust Mite Extract Cough  . Ace Inhibitors Cough    chronic cough  . Lovastatin Other (See Comments)    Generalized body pain    The results of significant diagnostics from this hospitalization (including imaging, microbiology, ancillary and laboratory)  are listed below for reference.    Significant Diagnostic Studies: Dg Chest 2 View  Result Date: 09/01/2017 CLINICAL DATA:  Severe headache.  Cough and wheezing. EXAM: CHEST  2 VIEW COMPARISON:  07/02/2017. FINDINGS: Mediastinum and hilar structures normal. Mild left base atelectasis/infiltrate. No pleural effusion or pneumothorax. Heart size normal. Thoracic spine scoliosis and degenerative change. IMPRESSION: Mild left base atelectasis/infiltrate. Electronically Signed   By: Marcello Moores  Register   On: 09/01/2017 14:03   Ct Head Wo Contrast  Result Date: 09/01/2017 CLINICAL DATA:  Severe headache for several days EXAM: CT HEAD WITHOUT CONTRAST TECHNIQUE: Contiguous axial images were obtained from the base of the skull through the vertex without intravenous contrast. COMPARISON:  12/22/2012 FINDINGS: Brain: No evidence of acute infarction,  hemorrhage, hydrocephalus, extra-axial collection or mass lesion/mass effect. Vascular: No hyperdense vessel or unexpected calcification. Skull: Normal. Negative for fracture or focal lesion. Sinuses/Orbits: No acute finding. Other: None. IMPRESSION: No acute intracranial abnormality noted. Electronically Signed   By: Inez Catalina M.D.   On: 09/01/2017 16:32   Mr Jodene Nam Head Wo Contrast  Result Date: 09/02/2017 CLINICAL DATA:  Slurred speech and facial droop, now resolved. EXAM: MRA HEAD WITHOUT CONTRAST TECHNIQUE: Angiographic images of the Circle of Willis were obtained using MRA technique without intravenous contrast. COMPARISON:  MRI of the head September 01, 2017 and CT HEAD September 01, 2017 FINDINGS: ANTERIOR CIRCULATION: Flow related enhancement bilateral cervical internal carotid arteries, however there is moderate tandem stenosis RIGHT carotid siphon. 2 mm superiorly directed outpouching LEFT supraclinoid internal carotid artery. Focally ectatic LEFT cavernous segment without discrete aneurysm, associated with advanced calcific atherosclerosis on recent head CT. Mild luminal irregularity of the anterior and middle cerebral arteries which are patent. Moderate stenosis LEFT M2 origin. No large vessel occlusion, flow limiting stenosis. POSTERIOR CIRCULATION: Codominant vertebral arteries. Basilar artery is patent, with normal flow related enhancement of the main branch vessels. Patent posterior cerebral arteries, mild luminal regularity. No large vessel occlusion, flow limiting stenosis,  aneurysm. ANATOMIC VARIANTS: Hypoplastic RIGHT A1 segment. 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 Source images and MIP images were reviewed. IMPRESSION: 1. No emergent large vessel occlusion. 2. Moderate stenosis RIGHT carotid siphon associated with calcific atherosclerosis on prior CT. 3. Mild cerebral artery atherosclerosis. Moderate stenosis LEFT M2 segment. 4. 2 mm probable aneurysm versus atherosclerotic irregularity LEFT  supraclinoid internal carotid artery, CTA HEAD may better characterize. Electronically Signed   By: Elon Alas M.D.   On: 09/02/2017 21:11   Mr Brain Wo Contrast  Result Date: 09/01/2017 CLINICAL DATA:  Initial evaluation for acute severe headache for several days, altered mental status. EXAM: MRI HEAD WITHOUT CONTRAST TECHNIQUE: Multiplanar, multiecho pulse sequences of the brain and surrounding structures were obtained without intravenous contrast. COMPARISON:  Prior CT from earlier the same day. FINDINGS: Brain: Examination mildly degraded by motion artifact. Generalized age appropriate cerebral atrophy. Patchy and confluent T2/FLAIR hyperintensity within the periventricular and deep white matter both cerebral hemispheres, most consistent with chronic small vessel ischemic changes, mild for age. Single tiny remote lacunar infarct noted within the right thalamus. No abnormal foci of restricted diffusion to suggest acute or subacute ischemia. Gray-white matter differentiation maintained. No evidence for acute or chronic intracranial hemorrhage. No mass lesion, midline shift or mass effect. No hydrocephalus. No extra-axial fluid collection. Major dural sinuses are grossly patent. Pituitary gland grossly within normal limits. Midline structures intact. The Vascular: Major intracranial vascular flow voids are maintained at the skull base. Skull and upper cervical spine: Craniocervical  junction within normal limits. Upper cervical spine normal. Bone marrow signal intensity within normal limits. No scalp soft tissue abnormality. Sinuses/Orbits: Globes and orbital soft tissues within normal limits. Patient status post cataract extraction bilaterally. Scattered mucosal thickening throughout the paranasal sinuses. No air-fluid level to suggest acute sinusitis. No mastoid effusion. Other: None. IMPRESSION: 1. No acute intracranial abnormality. 2. 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 Electronically Signed   By: Jeannine Boga M.D.   On: 09/01/2017 22:39    Microbiology: Recent Results (from the past 240 hour(s))  Respiratory Panel by PCR     Status: Abnormal   Collection Time: 09/01/17  6:54 PM  Result Value Ref Range Status   Adenovirus NOT DETECTED NOT DETECTED Final   Coronavirus 229E NOT DETECTED NOT DETECTED Final   Coronavirus HKU1 NOT DETECTED NOT DETECTED Final   Coronavirus NL63 NOT DETECTED NOT DETECTED Final   Coronavirus OC43 DETECTED (A) NOT DETECTED Final   Metapneumovirus NOT DETECTED NOT DETECTED Final   Rhinovirus / Enterovirus NOT DETECTED NOT DETECTED Final   Influenza A NOT DETECTED NOT DETECTED Final   Influenza B NOT DETECTED NOT DETECTED Final   Parainfluenza Virus 1 NOT DETECTED NOT DETECTED Final   Parainfluenza Virus 2 NOT DETECTED NOT DETECTED Final   Parainfluenza Virus 3 NOT DETECTED NOT DETECTED Final   Parainfluenza Virus 4 NOT DETECTED NOT DETECTED Final   Respiratory Syncytial Virus NOT DETECTED NOT DETECTED Final   Bordetella pertussis NOT DETECTED NOT DETECTED Final   Chlamydophila pneumoniae NOT DETECTED NOT DETECTED Final   Mycoplasma pneumoniae NOT DETECTED NOT DETECTED Final  Culture, blood (routine x 2)     Status: None (Preliminary result)   Collection Time: 09/01/17  7:39 PM  Result Value Ref Range Status   Specimen Description BLOOD LEFT ANTECUBITAL  Final   Special Requests   Final    BOTTLES DRAWN AEROBIC AND ANAEROBIC Blood Culture adequate volume   Culture   Final    NO GROWTH 1 DAY Performed at [facility], [address] [place], [place] [address]    Report Status PENDING  Incomplete  Culture, blood (routine x 2)     Status: None (Preliminary result)   Collection Time: 09/01/17  7:44 PM  Result Value Ref Range Status   Specimen Description BLOOD RIGHT HAND  Final   Special Requests   Final    BOTTLES DRAWN AEROBIC ONLY Blood Culture adequate volume   Culture   Final      NO GROWTH 1 DAY Performed at [facility], [place] [address] [place], [place] [address]    Report Status PENDING  Incomplete     Labs: Basic Metabolic Panel: Recent Labs  Lab 09/01/17 1528 09/01/17 1605 09/02/17 0144 09/02/17 0411 09/03/17 0512  NA 131* 133*  --  137 140  K 3.4* 3.6  --  2.9* 3.6  CL 92* 93*  --  101 109  CO2 25  --   --  26 23  GLUCOSE 163* 169*  --  202* 157*  BUN 22* 26*  --  20 21*  CREATININE 1.75* 1.50* 1.41* 1.56* 1.52*  CALCIUM 9.4  --   --  8.9 9.1  MG  --   --   --  1.8 1.8   Liver Function Tests: Recent Labs  Lab 09/01/17 1528 09/02/17 0411  AST 34 25  ALT 22 19  ALKPHOS 76 74  BILITOT 0.9 0.6  PROT 7.7 6.9  ALBUMIN 4.3 3.5   CBC: Recent Labs  Lab 09/01/17 1528 09/01/17 1605 09/02/17 0144  WBC 6.3  --  9.4  NEUTROABS 4.6  --   --   HGB 12.9 14.3 12.7  HCT 36.2 42.0 35.5*  MCV 81.0  --  80.5  PLT 240  --  215    CBG: Recent Labs  Lab 09/02/17 1652 09/02/17 2319 09/03/17 0800 09/03/17 1157 09/03/17 1547  GLUCAP 149* 244* 183* 249* 144*    Active Problems:   Type 2 diabetes mellitus (HCC)   CKD (chronic kidney disease) stage 3, GFR 30-59 ml/min (HCC)   Acute metabolic encephalopathy   URI (upper respiratory infection)   Facial weakness   Benign essential HTN   Time coordinating discharge: 35 minutes  Signed:  Murray Hodgkins, MD [facility] 09/03/2017, 5:50 PM

## 2017-09-04 ENCOUNTER — Other Ambulatory Visit (HOSPITAL_COMMUNITY): Payer: Self-pay | Admitting: Interventional Radiology

## 2017-09-04 DIAGNOSIS — I729 Aneurysm of unspecified site: Secondary | ICD-10-CM

## 2017-09-07 ENCOUNTER — Ambulatory Visit: Payer: Self-pay | Admitting: Emergency Medicine

## 2017-09-07 LAB — CULTURE, BLOOD (ROUTINE X 2)
Culture: NO GROWTH
Culture: NO GROWTH
Special Requests: ADEQUATE
Special Requests: ADEQUATE

## 2017-09-09 ENCOUNTER — Ambulatory Visit: Payer: BLUE CROSS/BLUE SHIELD | Admitting: Family Medicine

## 2017-09-10 ENCOUNTER — Inpatient Hospital Stay: Payer: BLUE CROSS/BLUE SHIELD | Admitting: Acute Care

## 2017-09-10 ENCOUNTER — Other Ambulatory Visit (HOSPITAL_COMMUNITY): Payer: Self-pay | Admitting: Interventional Radiology

## 2017-09-10 ENCOUNTER — Ambulatory Visit (HOSPITAL_COMMUNITY)
Admission: RE | Admit: 2017-09-10 | Discharge: 2017-09-10 | Disposition: A | Discharge status: Home / Self Care | Payer: BLUE CROSS/BLUE SHIELD | Source: Ambulatory Visit | Attending: Interventional Radiology | Admitting: Interventional Radiology

## 2017-09-10 DIAGNOSIS — I729 Aneurysm of unspecified site: Secondary | ICD-10-CM

## 2017-09-10 HISTORY — PX: IR RADIOLOGIST EVAL & MGMT: IMG5224

## 2017-09-11 ENCOUNTER — Other Ambulatory Visit: Payer: Self-pay | Admitting: Radiology

## 2017-09-14 ENCOUNTER — Other Ambulatory Visit: Payer: Self-pay | Admitting: General Surgery

## 2017-09-14 ENCOUNTER — Encounter (HOSPITAL_COMMUNITY): Payer: Self-pay | Admitting: Interventional Radiology

## 2017-09-15 ENCOUNTER — Ambulatory Visit (HOSPITAL_COMMUNITY)
Admission: RE | Admit: 2017-09-15 | Discharge: 2017-09-15 | Disposition: A | Discharge status: Home / Self Care | Payer: BLUE CROSS/BLUE SHIELD | Source: Ambulatory Visit | Attending: Interventional Radiology | Admitting: Interventional Radiology

## 2017-09-15 ENCOUNTER — Other Ambulatory Visit (HOSPITAL_COMMUNITY): Payer: Self-pay | Admitting: Interventional Radiology

## 2017-09-15 ENCOUNTER — Encounter (HOSPITAL_COMMUNITY): Payer: Self-pay

## 2017-09-15 DIAGNOSIS — I729 Aneurysm of unspecified site: Secondary | ICD-10-CM

## 2017-09-15 DIAGNOSIS — Z7984 Long term (current) use of oral hypoglycemic drugs: Secondary | ICD-10-CM | POA: Insufficient documentation

## 2017-09-15 DIAGNOSIS — K219 Gastro-esophageal reflux disease without esophagitis: Secondary | ICD-10-CM | POA: Insufficient documentation

## 2017-09-15 DIAGNOSIS — I110 Hypertensive heart disease with heart failure: Secondary | ICD-10-CM | POA: Insufficient documentation

## 2017-09-15 DIAGNOSIS — M1990 Unspecified osteoarthritis, unspecified site: Secondary | ICD-10-CM | POA: Insufficient documentation

## 2017-09-15 DIAGNOSIS — I6523 Occlusion and stenosis of bilateral carotid arteries: Secondary | ICD-10-CM | POA: Insufficient documentation

## 2017-09-15 DIAGNOSIS — I5032 Chronic diastolic (congestive) heart failure: Secondary | ICD-10-CM | POA: Diagnosis not present

## 2017-09-15 DIAGNOSIS — E1140 Type 2 diabetes mellitus with diabetic neuropathy, unspecified: Secondary | ICD-10-CM | POA: Insufficient documentation

## 2017-09-15 DIAGNOSIS — I671 Cerebral aneurysm, nonruptured: Secondary | ICD-10-CM | POA: Diagnosis not present

## 2017-09-15 DIAGNOSIS — E785 Hyperlipidemia, unspecified: Secondary | ICD-10-CM | POA: Insufficient documentation

## 2017-09-15 DIAGNOSIS — R51 Headache: Secondary | ICD-10-CM | POA: Diagnosis present

## 2017-09-15 DIAGNOSIS — Z7982 Long term (current) use of aspirin: Secondary | ICD-10-CM | POA: Insufficient documentation

## 2017-09-15 HISTORY — PX: IR ANGIO VERTEBRAL SEL VERTEBRAL BILAT MOD SED: IMG5369

## 2017-09-15 HISTORY — PX: IR ANGIO INTRA EXTRACRAN SEL COM CAROTID INNOMINATE BILAT MOD SED: IMG5360

## 2017-09-15 LAB — APTT: aPTT: 26 seconds (ref 24–36)

## 2017-09-15 LAB — BASIC METABOLIC PANEL
Anion gap: 15 (ref 5–15)
BUN: 20 mg/dL (ref 6–20)
CO2: 24 mmol/L (ref 22–32)
Calcium: 9.2 mg/dL (ref 8.9–10.3)
Chloride: 102 mmol/L (ref 101–111)
Creatinine, Ser: 1.74 mg/dL — ABNORMAL HIGH (ref 0.44–1.00)
GFR calc Af Amer: 33 mL/min — ABNORMAL LOW (ref >60)
GFR calc non Af Amer: 29 mL/min — ABNORMAL LOW (ref >60)
Glucose, Bld: 175 mg/dL — ABNORMAL HIGH (ref 65–99)
Potassium: 3.2 mmol/L — ABNORMAL LOW (ref 3.5–5.1)
Sodium: 141 mmol/L (ref 135–145)

## 2017-09-15 LAB — PROTIME-INR
INR: 1.08
Prothrombin Time: 13.9 seconds (ref 11.4–15.2)

## 2017-09-15 LAB — CBC
HCT: 35.8 % — ABNORMAL LOW (ref 36.0–46.0)
Hemoglobin: 12.4 g/dL (ref 12.0–15.0)
MCH: 28.4 pg (ref 26.0–34.0)
MCHC: 34.6 g/dL (ref 30.0–36.0)
MCV: 82.1 fL (ref 78.0–100.0)
Platelets: 208 10*3/uL (ref 150–400)
RBC: 4.36 MIL/uL (ref 3.87–5.11)
RDW: 13.3 % (ref 11.5–15.5)
WBC: 5.1 10*3/uL (ref 4.0–10.5)

## 2017-09-15 LAB — GLUCOSE, CAPILLARY
Glucose-Capillary: 151 mg/dL — ABNORMAL HIGH (ref 65–99)
Glucose-Capillary: 111 mg/dL — ABNORMAL HIGH (ref 65–99)

## 2017-09-15 MED ORDER — FENTANYL CITRATE (PF) 100 MCG/2ML IJ SOLN
INTRAMUSCULAR | Status: AC
  Filled 2017-09-15: qty 2

## 2017-09-15 MED ORDER — SODIUM CHLORIDE 0.9 % IV SOLN: INTRAVENOUS | Status: DC

## 2017-09-15 MED ORDER — IOPAMIDOL (ISOVUE-300) INJECTION 61%
INTRAVENOUS | Status: AC
  Administered 2017-09-15: 81 mL
  Filled 2017-09-15: qty 50

## 2017-09-15 MED ORDER — SODIUM CHLORIDE 0.9 % IV SOLN
INTRAVENOUS | Status: AC | PRN
Start: 2017-09-15 — End: 2017-09-15
  Administered 2017-09-15: 250 mL/h via INTRAVENOUS

## 2017-09-15 MED ORDER — FENTANYL CITRATE (PF) 100 MCG/2ML IJ SOLN
INTRAMUSCULAR | Status: AC | PRN
  Administered 2017-09-15: 25 ug via INTRAVENOUS

## 2017-09-15 MED ORDER — LIDOCAINE HCL 1 % IJ SOLN
INTRAMUSCULAR | Status: AC
  Filled 2017-09-15: qty 20

## 2017-09-15 MED ORDER — HEPARIN SODIUM (PORCINE) 1000 UNIT/ML IJ SOLN
INTRAMUSCULAR | Status: AC
  Filled 2017-09-15: qty 1

## 2017-09-15 MED ORDER — MIDAZOLAM HCL 2 MG/2ML IJ SOLN
INTRAMUSCULAR | Status: AC
  Filled 2017-09-15: qty 2

## 2017-09-15 MED ORDER — SODIUM CHLORIDE 0.9 % IV SOLN: Freq: Once | INTRAVENOUS | Status: DC

## 2017-09-15 MED ORDER — MIDAZOLAM HCL 2 MG/2ML IJ SOLN
INTRAMUSCULAR | Status: AC | PRN
  Administered 2017-09-15: 1 mg via INTRAVENOUS

## 2017-09-15 MED ORDER — ACETAMINOPHEN 325 MG PO TABS
650.0000 mg | ORAL_TABLET | Freq: Once | ORAL | Status: AC
  Administered 2017-09-15: 650 mg via ORAL
  Filled 2017-09-15: qty 2

## 2017-09-15 MED ORDER — LIDOCAINE HCL (PF) 1 % IJ SOLN
INTRAMUSCULAR | Status: AC | PRN
  Administered 2017-09-15: 10 mL

## 2017-09-15 MED ORDER — IOPAMIDOL (ISOVUE-300) INJECTION 61%
INTRAVENOUS | Status: AC
  Filled 2017-09-15: qty 150

## 2017-09-15 MED ORDER — HEPARIN SODIUM (PORCINE) 1000 UNIT/ML IJ SOLN
INTRAMUSCULAR | Status: AC | PRN
  Administered 2017-09-15: 500 [IU] via INTRAVENOUS

## 2017-09-15 MED ORDER — ACETAMINOPHEN 325 MG PO TABS
ORAL_TABLET | ORAL | Status: AC
  Filled 2017-09-15: qty 2

## 2017-09-15 NOTE — Sedation Documentation (Signed)
Patient is resting comfortably. 

## 2017-09-15 NOTE — Discharge Instructions (Signed)
Angiogram, Care After °This sheet gives you information about how to care for yourself after your procedure. Your doctor may also give you more specific instructions. If you have problems or questions, contact your doctor. °Follow these instructions at home: °Insertion site care °· Follow instructions from your doctor about how to take care of your long, thin tube (catheter) insertion area. Make sure you: °? Wash your hands with soap and water before you change your bandage (dressing). If you cannot use soap and water, use hand sanitizer. °? Change your bandage as told by your doctor. °? Leave stitches (sutures), skin glue, or skin tape (adhesive) strips in place. They may need to stay in place for 2 weeks or longer. If tape strips get loose and curl up, you may trim the loose edges. Do not remove tape strips completely unless your doctor says it is okay. °· Do not take baths, swim, or use a hot tub until your doctor says it is okay. °· You may shower 24-48 hours after the procedure or as told by your doctor. °? Gently wash the area with plain soap and water. °? Pat the area dry with a clean towel. °? Do not rub the area. This may cause bleeding. °· Do not apply powder or lotion to the area. Keep the area clean and dry. °· Check your insertion area every day for signs of infection. Check for: °? More redness, swelling, or pain. °? Fluid or blood. °? Warmth. °? Pus or a bad smell. °Activity °· Rest as told by your doctor, usually for 1-2 days. °· Do not lift anything that is heavier than 10 lbs. (4.5 kg) or as told by your doctor. °· Do not drive for 24 hours if you were given a medicine to help you relax (sedative). °· Do not drive or use heavy machinery while taking prescription pain medicine. °General instructions °· Go back to your normal activities as told by your doctor, usually in about a week. Ask your doctor what activities are safe for you. °· If the insertion area starts to bleed, lie flat and put pressure  on the area. If the bleeding does not stop, get help right away. This is an emergency. °· Drink enough fluid to keep your pee (urine) clear or pale yellow. °· Take over-the-counter and prescription medicines only as told by your doctor. °· Keep all follow-up visits as told by your doctor. This is important. °Contact a doctor if: °· You have a fever. °· You have chills. °· You have more redness, swelling, or pain around your insertion area. °· You have fluid or blood coming from your insertion area. °· The insertion area feels warm to the touch. °· You have pus or a bad smell coming from your insertion area. °· You have more bruising around the insertion area. °· Blood collects in the tissue around the insertion area (hematoma) that may be painful to the touch. °Get help right away if: °· You have a lot of pain in the insertion area. °· The insertion area swells very fast. °· The insertion area is bleeding, and the bleeding does not stop after holding steady pressure on the area. °· The area near or just beyond the insertion area becomes pale, cool, tingly, or numb. °These symptoms may be an emergency. Do not wait to see if the symptoms will go away. Get medical help right away. Call your local emergency services (911 in the [place] Do not drive yourself to the hospital. °Summary °·   After the procedure, it is common to have bruising and tenderness at the long, thin tube insertion area. °· After the procedure, it is important to rest and drink plenty of fluids. °· Do not take baths, swim, or use a hot tub until your doctor says it is okay to do so. You may shower 24-48 hours after the procedure or as told by your doctor. °· If the insertion area starts to bleed, lie flat and put pressure on the area. If the bleeding does not stop, get help right away. This is an emergency. °This information is not intended to replace advice given to you by your health care provider. Make sure you discuss any questions you have with  your health care provider. °Document Released: 10/24/2008 Document Revised: 07/22/2016 Document Reviewed: 07/22/2016 °Elsevier Interactive Patient Education © 2017 [employer] °Moderate Conscious Sedation, Adult, Care After °These instructions provide you with information about caring for yourself after your procedure. Your health care provider may also give you more specific instructions. Your treatment has been planned according to current medical practices, but problems sometimes occur. Call your health care provider if you have any problems or questions after your procedure. °What can I expect after the procedure? °After your procedure, it is common: °· To feel sleepy for several hours. °· To feel clumsy and have poor balance for several hours. °· To have poor judgment for several hours. °· To vomit if you eat too soon. ° °Follow these instructions at home: °For at least 24 hours after the procedure: ° °· Do not: °? Participate in activities where you could fall or become injured. °? Drive. °? Use heavy machinery. °? Drink alcohol. °? Take sleeping pills or medicines that cause drowsiness. °? Make important decisions or sign legal documents. °? Take care of children on your own. °· Rest. °Eating and drinking °· Follow the diet recommended by your health care provider. °· If you vomit: °? Drink water, juice, or soup when you can drink without vomiting. °? Make sure you have little or no nausea before eating solid foods. °General instructions °· Have a responsible adult stay with you until you are awake and alert. °· Take over-the-counter and prescription medicines only as told by your health care provider. °· If you smoke, do not smoke without supervision. °· Keep all follow-up visits as told by your health care provider. This is important. °Contact a health care provider if: °· You keep feeling nauseous or you keep vomiting. °· You feel light-headed. °· You develop a rash. °· You have a fever. °Get help right  away if: °· You have trouble breathing. °This information is not intended to replace advice given to you by your health care provider. Make sure you discuss any questions you have with your health care provider. °Document Released: 05/18/2013 Document Revised: 12/31/2015 Document Reviewed: 11/17/2015 °Elsevier Interactive Patient Education © 2018 [employer] ° °

## 2017-09-15 NOTE — H&P (Signed)
Chief Complaint: Patient was seen in consultation today for cerebral arteriogram at the request of Dr Dillard Cannon   Supervising Physician: Luanne Bras  Patient Status: [facility] - Out-pt  History of Present Illness: Paula [name] is a 72 y.o. female   Presented to ED 09/01/17 Altered mental status Headaches; slurred speech Work up included CT and MRI/MRA 09/02/17: IMPRESSION: 1. No emergent large vessel occlusion. 2. Moderate stenosis RIGHT carotid siphon associated with calcific atherosclerosis on prior CT. 3. Mild cerebral artery atherosclerosis. Moderate stenosis LEFT M2 segment. 4. 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  All mental status changes have resolved Referred to Dr Estanislado Pandy as OP 1/31/Note: The MRI MRA findings were reviewed with the patient and also with her daughter. Brought to their attention was the focal outpouching in the left internal carotid artery supraclinoid region. A formal catheter arteriogram would be needed in order to more accurately identify the morphology of the suspected aneurysm in the left internal carotid artery supraclinoid region.  Now scheduled for same  Past Medical History:  Diagnosis Date  . Allergic rhinitis   . Arthritis   . Cough   . Diabetes mellitus   . Diastolic CHF, chronic ([place]) 10/11/2011  . GERD (gastroesophageal reflux disease)   . History of colon polyps 2012   tubular adenoma   . Hyperlipidemia   . Hypertension   . Neuropathy 10/11/2011  . PONV (postoperative nausea and vomiting)    trouble waking up    Past Surgical History:  Procedure Laterality Date  . CATARACT EXTRACTION     right eye  . IR RADIOLOGIST EVAL & MGMT  09/10/2017  . lymphatic mass surgery    . NASAL TURBINATE REDUCTION      Allergies: Dust mite extract; Ace inhibitors; and Lovastatin  Medications: Prior to Admission medications     Medication Sig Start Date End Date Taking? Authorizing Provider  albuterol (PROVENTIL HFA;VENTOLIN HFA) 108 (90 Base) MCG/ACT inhaler Inhale 2 puffs into the lungs every 6 (six) hours as needed. Shortness of breath 06/27/16   Charlott Rakes, MD  aspirin 81 MG tablet Take 81 mg by mouth daily.      [provider]  calcium carbonate (OSCAL) 1500 (600 Ca) MG TABS tablet Take 1,500 mg by mouth daily with breakfast.    [provider]  cetirizine (ZYRTEC) 10 MG tablet Take 1 tablet (10 mg total) by mouth daily. 08/20/17   Charlott Rakes, MD  glipiZIDE (GLUCOTROL) 10 MG tablet Take 1 tablet (10 mg total) by mouth 2 (two) times daily before a meal. 08/20/17   Newlin, Enobong, MD  glucose blood (TRUETEST TEST) test strip USE TO CHECK BLOOD AS DIRECTED THREE TIMES DAILY Patient not taking: Reported on 08/20/2017 07/28/17   Charlott Rakes, MD  isosorbide mononitrate (IMDUR) 60 MG 24 hr tablet Take 1 tablet (60 mg total) by mouth daily. 08/20/17   Charlott Rakes, MD  Melatonin 10 MG TABS Take 1 tablet by mouth at bedtime.    [provider]  mometasone (NASONEX) 50 MCG/ACT nasal spray Place 2 sprays into the nose daily as needed (allergies).  08/13/17   [provider]  pantoprazole (PROTONIX) 40 MG tablet Take 1 tablet (40 mg total) by mouth daily. Patient taking differently: Take 40 mg by mouth daily as needed (heartburn).  07/14/17   Collene Gobble, MD  Polyethyl Glycol-Propyl Glycol (SYSTANE) 0.4-0.3 % SOLN Place 1 drop into both  eyes 2 (two) times daily as needed (dry eyes).     [provider]  pseudoephedrine-guaifenesin (MUCINEX D) 60-600 MG 12 hr tablet Take 1 tablet by mouth every 12 (twelve) hours as needed for congestion.    [provider]  rosuvastatin (CRESTOR) 5 MG tablet Take 1 tablet (5 mg total) by mouth daily. 08/20/17   Charlott Rakes, MD  verapamil (CALAN) 80 MG tablet Take 1 tablet (80 mg total) by mouth 2 (two) times daily. 08/20/17    Charlott Rakes, MD     Family History  Problem Relation Age of Onset  . Hypertension Mother   . Asthma Sister   . Colon cancer Neg Hx     Social History   Socioeconomic History  . Marital status: Widowed    Spouse name: None  . Number of children: 4  . Years of education: None  . Highest education level: None  Social Needs  . Financial resource strain: None  . Food insecurity - worry: None  . Food insecurity - inability: None  . Transportation needs - medical: None  . Transportation needs - non-medical: None  Occupational History  . Occupation: Unemployed  Tobacco Use  . Smoking status: Never Smoker  . Smokeless tobacco: Never Used  Substance and Sexual Activity  . Alcohol use: No  . Drug use: No  . Sexual activity: Not Currently    Birth control/protection: None  Other Topics Concern  . None  Social History Narrative  . None    Review of Systems: A 12 point ROS discussed and pertinent positives are indicated in the HPI above.  All other systems are negative.  Review of Systems  Constitutional: Positive for activity change. Negative for fatigue and fever.  HENT: Negative for hearing loss and tinnitus.   Eyes: Negative for photophobia and visual disturbance.  Respiratory: Positive for cough. Negative for shortness of breath.   Cardiovascular: Negative for chest pain.  Gastrointestinal: Negative for abdominal pain.  Musculoskeletal: Negative for gait problem.  Neurological: Positive for headaches. Negative for dizziness, tremors, seizures, syncope, facial asymmetry, speech difficulty, weakness, light-headedness and numbness.  Psychiatric/Behavioral: Negative for behavioral problems and confusion.    Vital Signs: BP (!) 153/73   Pulse 90   Temp 98.8 F (37.1 C)   Resp 20   Ht 5\' 3"  (1.6 m)   Wt 156 lb (70.8 kg)   LMP  (LMP Unknown)   SpO2 97%   BMI 27.63 kg/m   Physical Exam  Constitutional: She is oriented to person, place, and time. She appears  well-nourished.  HENT:  Head: Atraumatic.  Eyes: EOM are normal.  Neck: Neck supple.  Cardiovascular: Normal rate, regular rhythm and normal heart sounds.  Pulmonary/Chest: Effort normal and breath sounds normal.  Abdominal: Soft. Bowel sounds are normal.  Musculoskeletal: Normal range of motion.  Neurological: She is alert and oriented to person, place, and time.  Skin: Skin is warm and dry.  Psychiatric: She has a normal mood and affect. Her behavior is normal. Judgment and thought content normal.  Nursing note and vitals reviewed.   Imaging:   Labs:  CBC: Recent Labs    07/02/17 1810 07/26/17 1416 09/01/17 1528 09/01/17 1605 09/02/17 0144  WBC 5.9 8.0 6.3  --  9.4  HGB 14.8 13.3 12.9 14.3 12.7  HCT 41.3 36.9 36.2 42.0 35.5*  PLT 124* 273 240  --  215    COAGS: Recent Labs    09/01/17 1551  INR 1.01  APTT  27    BMP: Recent Labs    07/26/17 1555 09/01/17 1528 09/01/17 1605 09/02/17 0144 09/02/17 0411 09/03/17 0512  NA 135 131* 133*  --  137 140  K 4.0 3.4* 3.6  --  2.9* 3.6  CL 98* 92* 93*  --  101 109  CO2 28 25  --   --  26 23  GLUCOSE 354* 163* 169*  --  202* 157*  BUN 30* 22* 26*  --  20 21*  CALCIUM 9.0 9.4  --   --  8.9 9.1  CREATININE 1.69* 1.75* 1.50* 1.41* 1.56* 1.52*  GFRNONAA 30* 28*  --  37* 33* 34*  GFRAA 34* 33*  --  43* 38* 39*    LIVER FUNCTION TESTS: Recent Labs    07/02/17 1712 07/26/17 1555 09/01/17 1528 09/02/17 0411  BILITOT 0.3 0.5 0.9 0.6  AST 23 30 34 25  ALT 20 34 22 19  ALKPHOS 70 73 76 74  PROT 7.2 7.3 7.7 6.9  ALBUMIN 3.7 3.7 4.3 3.5    TUMOR MARKERS: No results for input(s): AFPTM, CEA, CA199, CHROMGRNA in the last 8760 hours.  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 This interventional procedure involves the use of X-rays and because of the nature of the planned procedure, it is possible that we will have prolonged use of X-ray fluoroscopy. Potential radiation risks to you include (but are not limited to) the following: - A slightly elevated risk for cancer  several years later in life. This risk is typically less than 0.5% percent. This risk is low in comparison to the normal incidence of human cancer, which is 33% for women and 50% for men according to the [place]. - Radiation induced injury can include skin redness, resembling a rash, tissue breakdown / ulcers and hair loss (which can be temporary or permanent).  The likelihood of either of these occurring depends on the difficulty of the procedure and whether you are sensitive to radiation due to previous procedures, disease, or genetic conditions.  IF your procedure requires a prolonged use of radiation, you will be notified and given written instructions for further action.  It is your responsibility to monitor the irradiated area for the 2 weeks following the procedure and to notify your physician if you are concerned that you have suffered a radiation induced injury.    All of the patient's questions were answered, patient is agreeable to proceed. Consent signed and in chart.  Thank you for this interesting consult.  I greatly enjoyed meeting Paula [name] and look forward to participating in their care.  A copy of this report was sent to the requesting provider on this date.  Electronically Signed: Lavonia Drafts, PA-C 09/15/2017, 9:36 AM   I spent a total of  30 min   in face to face in clinical consultation, greater than 50% of which was counseling/coordinating care for cerebral arteriogram

## 2017-09-15 NOTE — Progress Notes (Signed)
(  R) groin dsg changed. VPad removed.redressed with 2x2 and tegederm dsg.  Site unremarkable.  Pt ambulated in halls without bleeding. (R) groin site remains unremarkable

## 2017-09-15 NOTE — Procedures (Signed)
S/P 4 vessel cerebral arteriogram. RT CFA approach. Findings. 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 2.50 % mid basilar A stenosis. 3.Approx 65 % stenosis of origin of LT [place]. 4.Lt ICA approx 6.29mm x 4.4 mm ICA ?PCOM region aneurysm. 5.ACON  3.6 mm x 3.5  fusiform   aneurysm 6.Approc 50 % stenosis of LT ICA petrous cavernous junction.

## 2017-09-16 ENCOUNTER — Other Ambulatory Visit (HOSPITAL_COMMUNITY): Payer: Self-pay | Admitting: Interventional Radiology

## 2017-09-16 ENCOUNTER — Encounter (HOSPITAL_COMMUNITY): Payer: Self-pay | Admitting: Interventional Radiology

## 2017-09-16 DIAGNOSIS — I729 Aneurysm of unspecified site: Secondary | ICD-10-CM

## 2017-09-21 ENCOUNTER — Ambulatory Visit: Payer: BLUE CROSS/BLUE SHIELD | Admitting: Family Medicine

## 2017-09-22 ENCOUNTER — Ambulatory Visit: Payer: BLUE CROSS/BLUE SHIELD | Admitting: Family Medicine

## 2017-09-24 ENCOUNTER — Telehealth: Payer: Self-pay

## 2017-09-24 ENCOUNTER — Ambulatory Visit: Payer: BLUE CROSS/BLUE SHIELD | Attending: Family Medicine | Admitting: Family Medicine

## 2017-09-24 ENCOUNTER — Encounter: Payer: Self-pay | Admitting: Family Medicine

## 2017-09-24 VITALS — BP 138/70 | HR 78 | Temp 98.0°F | Ht 63.0 in | Wt 151.6 lb

## 2017-09-24 DIAGNOSIS — E1122 Type 2 diabetes mellitus with diabetic chronic kidney disease: Secondary | ICD-10-CM | POA: Insufficient documentation

## 2017-09-24 DIAGNOSIS — N183 Chronic kidney disease, stage 3 unspecified: Secondary | ICD-10-CM

## 2017-09-24 DIAGNOSIS — K219 Gastro-esophageal reflux disease without esophagitis: Secondary | ICD-10-CM | POA: Insufficient documentation

## 2017-09-24 DIAGNOSIS — J3089 Other allergic rhinitis: Secondary | ICD-10-CM | POA: Diagnosis not present

## 2017-09-24 DIAGNOSIS — I10 Essential (primary) hypertension: Secondary | ICD-10-CM | POA: Diagnosis not present

## 2017-09-24 DIAGNOSIS — E1140 Type 2 diabetes mellitus with diabetic neuropathy, unspecified: Secondary | ICD-10-CM | POA: Insufficient documentation

## 2017-09-24 DIAGNOSIS — I130 Hypertensive heart and chronic kidney disease with heart failure and stage 1 through stage 4 chronic kidney disease, or unspecified chronic kidney disease: Secondary | ICD-10-CM | POA: Insufficient documentation

## 2017-09-24 DIAGNOSIS — E119 Type 2 diabetes mellitus without complications: Secondary | ICD-10-CM | POA: Diagnosis not present

## 2017-09-24 DIAGNOSIS — Z9111 Patient's noncompliance with dietary regimen: Secondary | ICD-10-CM | POA: Insufficient documentation

## 2017-09-24 DIAGNOSIS — I5032 Chronic diastolic (congestive) heart failure: Secondary | ICD-10-CM | POA: Diagnosis not present

## 2017-09-24 DIAGNOSIS — I729 Aneurysm of unspecified site: Secondary | ICD-10-CM | POA: Diagnosis not present

## 2017-09-24 DIAGNOSIS — J302 Other seasonal allergic rhinitis: Secondary | ICD-10-CM | POA: Diagnosis not present

## 2017-09-24 DIAGNOSIS — Z79899 Other long term (current) drug therapy: Secondary | ICD-10-CM | POA: Insufficient documentation

## 2017-09-24 DIAGNOSIS — Z7982 Long term (current) use of aspirin: Secondary | ICD-10-CM | POA: Diagnosis not present

## 2017-09-24 DIAGNOSIS — Z7984 Long term (current) use of oral hypoglycemic drugs: Secondary | ICD-10-CM | POA: Insufficient documentation

## 2017-09-24 DIAGNOSIS — I671 Cerebral aneurysm, nonruptured: Secondary | ICD-10-CM | POA: Diagnosis not present

## 2017-09-24 LAB — GLUCOSE, POCT (MANUAL RESULT ENTRY): POC Glucose: 234 mg/dl — AB (ref 70–99)

## 2017-09-24 MED ORDER — MONTELUKAST SODIUM 10 MG PO TABS: 10.0000 mg | ORAL_TABLET | Freq: Every day | ORAL | 1 refills | Status: DC

## 2017-09-24 MED ORDER — HYDROCODONE-HOMATROPINE 5-1.5 MG/5ML PO SYRP: 5.0000 mL | ORAL_SOLUTION | Freq: Two times a day (BID) | ORAL | 0 refills | Status: DC | PRN

## 2017-09-24 NOTE — Progress Notes (Signed)
Subjective:  Patient ID: Paula [name], female    DOB: 1945-10-09  Age: 72 y.o. MRN: [id number]  CC: Hospitalization Follow-up   HPI NARELY NOBLES is a 72 year old female with a history of type 2 diabetes mellitus (A1c 8.3 ), hypertension, hypercholesterolemia, allergic rhinitis, stage III CKD (managed by Dr Pamala Duffel Kidney) who comes into the clinic for a follow-up visit company by her daughter after recent hospitalization at [facility] from 09/01/17- 09/03/17 for acute metabolic encephalopathy and URI secondary to coronavirus.  She had presented to the ED with confusion, headache, cough brief episode of slurred speech and facial droop which had resolved prior to presentation. CT head revealed no acute intracranial abnormality, MRI of the brain revealed no acute intracranial abnormality, mild chronic microvascular ischemic disease with small remote right thalamic lacunar infarct.  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 Estanislado Pandy for follow-up. She received IV ceftriaxone and azithromycin which was subsequently discontinued after respiratory virus panel was positive for coronavirus  Today she complains of headaches which occur whenever she coughs and this is described as pounding but at this time she describes it as heaviness as she has no pain.  She continues to complain of spontaneous cough which would occur in spells with resultant production of mucus from her mouth and her nose (witnessed at today's visit) and I had referred her to ENT but her appointment is pending.  Her symptoms were controlled while she was on tramadol she continues to insist.  Currently using Nasonex and Zyrtec with no relief.  Since discharge she underwent IR angiogram with findings below:  IMPRESSION: Approximately 5.2 mm x 3.4 mm wide-based mildly lobulated left internal carotid  artery superior hypophyseal region aneurysm.  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 Approximately 50% stenosis of the left internal carotid artery petrous cavernous junction.  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  Approximately 80% stenosis at the origin of the right external carotid artery with the post stenotic dilatation.   Past Medical History:  Diagnosis Date  . Allergic rhinitis   . Arthritis   . Cough   . Diabetes mellitus   . Diastolic CHF, chronic ([place]) 10/11/2011  . GERD (gastroesophageal reflux disease)   . History of colon polyps 2012   tubular adenoma   . Hyperlipidemia   . Hypertension   . Neuropathy 10/11/2011  . PONV (postoperative nausea and vomiting)    trouble waking up    Past Surgical History:  Procedure Laterality Date  . CATARACT EXTRACTION     right eye  . IR ANGIO INTRA EXTRACRAN SEL COM CAROTID INNOMINATE BILAT MOD SED  09/15/2017  . IR ANGIO VERTEBRAL SEL VERTEBRAL BILAT MOD SED  09/15/2017  . IR RADIOLOGIST EVAL & MGMT  09/10/2017  . lymphatic mass surgery    . NASAL TURBINATE REDUCTION      Allergies  Allergen Reactions  . Dust Mite Extract Cough  . Ace Inhibitors Cough    chronic cough  . Lovastatin Other (See Comments)    Generalized body pain     Outpatient Medications Prior to Visit  Medication Sig Dispense Refill  .  acetaminophen (TYLENOL) 500 MG tablet Take 500 mg by mouth every 6 (six) hours as needed for headache.    . albuterol (PROVENTIL HFA;VENTOLIN HFA) 108 (90 Base) MCG/ACT inhaler Inhale 2 puffs into the lungs every 6 (six) hours as needed. Shortness of breath 18 g 2  . aspirin 81 MG tablet Take 81 mg by mouth daily.      .  calcium carbonate (OSCAL) 1500 (600 Ca) MG TABS tablet Take 1,500 mg by mouth daily with breakfast.    . Camphor-Eucalyptus-Menthol (CHEST RUB EX) Apply 1 application topically daily as needed (cough).    . cetirizine (ZYRTEC) 10 MG tablet Take 1 tablet (10 mg total) by mouth daily. 90 tablet 1  . glipiZIDE (GLUCOTROL) 10 MG tablet Take 1 tablet (10 mg total) by mouth 2 (two) times daily before a meal. 60 tablet 3  . isosorbide mononitrate (IMDUR) 60 MG 24 hr tablet Take 1 tablet (60 mg total) by mouth daily. 90 tablet 1  . Melatonin 10 MG TABS Take 10 mg by mouth at bedtime as needed (sleep).     . mometasone (NASONEX) 50 MCG/ACT nasal spray Place 2 sprays into the nose daily as needed (allergies).   0  . pantoprazole (PROTONIX) 40 MG tablet Take 1 tablet (40 mg total) by mouth daily. (Patient taking differently: Take 40 mg by mouth daily as needed (heartburn). ) 30 tablet 5  . Polyethyl Glycol-Propyl Glycol (SYSTANE) 0.4-0.3 % SOLN Place 1 drop into both eyes 2 (two) times daily as needed (dry eyes).     . Propylene Glycol (SYSTANE BALANCE) 0.6 % SOLN Apply 1 drop to eye daily.    . pseudoephedrine-guaifenesin (MUCINEX D) 60-600 MG 12 hr tablet Take 1 tablet by mouth every 12 (twelve) hours as needed for congestion.    . rosuvastatin (CRESTOR) 5 MG tablet Take 1 tablet (5 mg total) by mouth daily. 90 tablet 1  . verapamil (CALAN) 80 MG tablet Take 1 tablet (80 mg total) by mouth 2 (two) times daily. 180 tablet 1  . glucose blood (TRUETEST TEST) test strip USE TO CHECK BLOOD AS DIRECTED THREE TIMES DAILY (Patient not taking: Reported on 08/20/2017) 100 each 5   No facility-administered medications prior to visit.     ROS Review of Systems  Constitutional: Negative for activity change, appetite change and fatigue.  HENT: Positive for postnasal drip. Negative for congestion, rhinorrhea, sinus pressure, sinus pain and sore throat.   Eyes: Negative for visual disturbance.  Respiratory: Positive  for cough. Negative for chest tightness, shortness of breath and wheezing.   Cardiovascular: Negative for chest pain and palpitations.  Gastrointestinal: Negative for abdominal distention, abdominal pain and constipation.  Endocrine: Negative for polydipsia.  Genitourinary: Negative for dysuria and frequency.  Musculoskeletal: Negative for arthralgias and back pain.  Skin: Negative for rash.  Neurological: Negative for tremors, light-headedness and numbness.  Hematological: Does not bruise/bleed easily.  Psychiatric/Behavioral: Negative for agitation and behavioral problems.    Objective:  BP 138/70   Pulse 78   Temp 98 F (36.7 C) (Oral)   Ht _0  (1.6 m)   Wt 151 lb 9.6 oz (68.8 kg)   LMP  (LMP Unknown)   SpO2 98%   BMI 26.85 kg/m   BP/Weight 09/24/2017 09/15/2017 8/89/1694  Systolic BP 503 888 280  Diastolic BP 70 79 71  Wt. (Lbs) 151.6 156 156.9  BMI 26.85 27.63 27.79      Physical Exam  Constitutional: She is oriented to person,  place, and time. She appears well-developed and well-nourished.  Cardiovascular: Normal rate, normal heart sounds and intact distal pulses.  No murmur heard. Pulmonary/Chest: Effort normal and breath sounds normal. She has no wheezes. She has no rales. She exhibits no tenderness.  Abdominal: Soft. Bowel sounds are normal. She exhibits no distension and no mass. There is no tenderness.  Musculoskeletal: Normal range of motion.  Neurological: She is alert and oriented to person, place, and time.  Skin: Skin is warm and dry.  Psychiatric: She has a normal mood and affect.     Lab Results  Component Value Date   HGBA1C 8.3 08/20/2017    Assessment & Plan:   1. Type 2 diabetes mellitus without complication, without long-term current use of insulin (HCC) Controlled with A1c of 8.3 Patient has been noncompliant with diabetic diet. Compliance with diabetic regimen emphasized Counseled on Diabetic diet, my plate method, 209 minutes of  moderate intensity exercise/week Keep blood sugar logs with fasting goals of 80-120 mg/dl, random of less than 180 and in the event of sugars less than 60 mg/dl or greater than 400 mg/dl please notify the clinic ASAP. It is recommended that you undergo annual eye exams and annual foot exams. Pneumonia vaccine is recommended. - POCT glucose (manual entry) - CMP14+EGFR  2. Essential hypertension Controlled Continue antihypertensive  3. Seasonal and perennial allergic rhinitis Uncontrolled Referred to ENT previously We will add Singulair to her regimen while she continues Nasonex and Zyrtec She continues to insist that tramadol controlled her symptoms in the past  4. CKD (chronic kidney disease) stage 3, GFR 30-59 ml/min (HCC) Currently followed by nephrology We will send of complaints of metabolic panel today  5. 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 Has upcoming appointment with interventional radiology.   Meds ordered this encounter  Medications  . HYDROcodone-homatropine (HYCODAN) 5-1.5 MG/5ML syrup    Sig: Take 5 mLs by mouth every 12 (twelve) hours as needed for cough.    Dispense:  120 mL    Refill:  0  . montelukast (SINGULAIR) 10 MG tablet    Sig: Take 1 tablet (10 mg total) by mouth at bedtime.    Dispense:  30 tablet    Refill:  1    Follow-up: Return in about 6 weeks (around 11/05/2017) for follow up of chronic medical conditions.   Charlott Rakes MD

## 2017-09-24 NOTE — Telephone Encounter (Signed)
Met with the patient and her daughter when she was in the clinic today. Completed SCAT application and faxed to SCAT eligibility.  

## 2017-09-24 NOTE — Patient Instructions (Signed)

## 2017-09-25 LAB — CMP14+EGFR
ALT: 21 IU/L (ref 0–32)
AST: 18 IU/L (ref 0–40)
Albumin/Globulin Ratio: 1.4 (ref 1.2–2.2)
Albumin: 4.1 g/dL (ref 3.5–4.8)
Alkaline Phosphatase: 79 IU/L (ref 39–117)
BUN/Creatinine Ratio: 12 (ref 12–28)
BUN: 22 mg/dL (ref 8–27)
Bilirubin Total: 0.4 mg/dL (ref 0.0–1.2)
CO2: 24 mmol/L (ref 20–29)
Calcium: 9.8 mg/dL (ref 8.7–10.3)
Chloride: 95 mmol/L — ABNORMAL LOW (ref 96–106)
Creatinine, Ser: 1.88 mg/dL — ABNORMAL HIGH (ref 0.57–1.00)
GFR calc Af Amer: 31 mL/min/{1.73_m2} — ABNORMAL LOW (ref >59)
GFR calc non Af Amer: 27 mL/min/{1.73_m2} — ABNORMAL LOW (ref >59)
Globulin, Total: 3 g/dL (ref 1.5–4.5)
Glucose: 181 mg/dL — ABNORMAL HIGH (ref 65–99)
Potassium: 4.2 mmol/L (ref 3.5–5.2)
Sodium: 137 mmol/L (ref 134–144)
Total Protein: 7.1 g/dL (ref 6.0–8.5)

## 2017-09-29 ENCOUNTER — Ambulatory Visit (HOSPITAL_COMMUNITY): Payer: BLUE CROSS/BLUE SHIELD

## 2017-10-01 ENCOUNTER — Telehealth: Payer: Self-pay

## 2017-10-01 ENCOUNTER — Encounter: Payer: Self-pay | Admitting: Allergy

## 2017-10-01 ENCOUNTER — Ambulatory Visit (INDEPENDENT_AMBULATORY_CARE_PROVIDER_SITE_OTHER): Payer: BLUE CROSS/BLUE SHIELD | Admitting: Allergy

## 2017-10-01 VITALS — BP 138/70 | HR 64 | Temp 98.1°F | Resp 20 | Ht 61.0 in | Wt 150.4 lb

## 2017-10-01 DIAGNOSIS — I5032 Chronic diastolic (congestive) heart failure: Secondary | ICD-10-CM | POA: Diagnosis not present

## 2017-10-01 DIAGNOSIS — H1010 Acute atopic conjunctivitis, unspecified eye: Secondary | ICD-10-CM | POA: Insufficient documentation

## 2017-10-01 DIAGNOSIS — J310 Chronic rhinitis: Secondary | ICD-10-CM

## 2017-10-01 DIAGNOSIS — R05 Cough: Secondary | ICD-10-CM

## 2017-10-01 DIAGNOSIS — R059 Cough, unspecified: Secondary | ICD-10-CM

## 2017-10-01 DIAGNOSIS — E119 Type 2 diabetes mellitus without complications: Secondary | ICD-10-CM

## 2017-10-01 HISTORY — DX: Chronic rhinitis: J31.0

## 2017-10-01 MED ORDER — RANITIDINE HCL 300 MG PO CAPS: 300.0000 mg | ORAL_CAPSULE | Freq: Every evening | ORAL | 5 refills | Status: DC

## 2017-10-01 MED ORDER — AZELASTINE HCL 0.1 % NA SOLN: 2.0000 | Freq: Two times a day (BID) | NASAL | 5 refills | Status: DC

## 2017-10-01 MED ORDER — LEVOCETIRIZINE DIHYDROCHLORIDE 5 MG PO TABS
5.0000 mg | ORAL_TABLET | Freq: Every evening | ORAL | 5 refills | Status: DC
Start: 2017-10-01 — End: 2018-06-23

## 2017-10-01 NOTE — Progress Notes (Signed)
New Patient Note  RE: Paula [name] MRN: [id number] DOB: [birth year] Date of Office Visit: 10/01/2017  Referring provider: Charlott Rakes, MD Primary care provider: Charlott Rakes, MD  Chief Complaint: Cough and nasal drip  History of present illness: Paula [name] is a 72 y.o. female presenting today for consultation for allergic rhinitis with cough. She reports a long history of allergic rhinitis and GERD for which she has visited Dr. Ishmael [name] in the past. She also reports going to [facility] clinic and receiving Tramadol, for chronic midline low back pain without sciatica, which she reports completely resolved her rhinitis and cough. She stopped taking Tramadol in mid November, 2018 and reports, that since then, she has experienced an increase in nasal drainage and cough.  She reports going to the emergency department on 07/26/2017 due to hyperglycemia with blood sugars over 400 which was likely caused by increased prednisone use designed to control her allergic rhinitis.  She does have a history of dust mite allergy.  On 09/01/2017 Paula [name] presented to the [facility] emergency department with cough, headache, confusion, brief episode of facial droop and slurred speech and was admitted for cough, metabolic encephalopathy, and possible pneumonia.  Echocardiogram indicated ejection fraction in the range of 45-80%, grade 1 diastolic dysfunction, aortic valve regurgitation, and mitral valve regurgitation.  On 09/02/2017 she had an MRA head without contrast with the following impression: No emergent large vessel occlusion, moderate stenosis right carotid siphon associated with cephalic atherosclerosis on prior CT.  Mild cerebral artery atherosclerosis.  Moderate stenosis left M2 segment.  4.2 mm probable aneurysm versus atherosclerotic irregularity left supra clinoid internal carotid artery.  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 Approximately 50% stenosis of the left internal carotid artery petrous cavernous junction. 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 Approximately 80% stenosis at the origin of the right external carotid artery with the post stenotic dilatation.  At today's visit, she reports her allergic rhinitis is uncontrolled despite aggressive use of medications. She reports constant clear nasal drainage from both nares, dry and itchy eyes, intermittent pain in both ears, and continuous productive cough with clear sputum. She reports a lump that appears on the right side of her throat when she is having an extended period of coughing that completely resolves, with her neck becoming smooth again, when she stops coughing. She is currently using Nasonex 2 sprays in each nare twice a day and taking Zyrtec 10 mg once a day with no relief. She is not currently taking montelukast, however, she reports taking this on a previous occasion with no relief of symptoms.   Paula [name] reports an increase in gastric reflux over the last several months despite pantoprazole daily.  She reports heartburn and burning feeling in her throat frequently. She reports an increase in throat clearing over the last 2 months.   Paula [name] reports no history of shortness of breath, or asthma.  She does report that she received an albuterol inhaler that she uses about 2-3 times a day when  she cannot stop coughing which leads to wheezing.  She  reports this strategy has not been effective in stopping her cough.  She denies a history of or current eczema.   Review of systems: Review of Systems  Constitutional: Negative.   HENT:       Continuous cough, continuous sinus drainage, continuous postnasal drainage, intermittent ear pain, continuous headache.  Eyes:       Occasional dry red itchy eyes  Respiratory: Positive for cough and wheezing.   Cardiovascular: Negative.   Gastrointestinal: Positive for heartburn.  Genitourinary: Negative.   Musculoskeletal: Positive for back pain.  Skin: Negative.   Neurological: Positive for headaches.  Endo/Heme/Allergies: Negative.   Psychiatric/Behavioral: Negative.     All other systems negative unless noted above in HPI  Past medical history: Past Medical History:  Diagnosis Date  . Allergic rhinitis   . Arthritis   . Cough   . Diabetes mellitus   . Diastolic CHF, chronic ([place]) 10/11/2011  . GERD (gastroesophageal reflux disease)   . History of colon polyps 2012   tubular adenoma   . Hyperlipidemia   . Hypertension   . Neuropathy 10/11/2011  . PONV (postoperative nausea and vomiting)    trouble waking up    Past surgical history: Past Surgical History:  Procedure Laterality Date  . CATARACT EXTRACTION     right eye  . Paula [name]  09/15/2017  . Paula [name]  09/15/2017  . Paula [name]  09/10/2017  . lymphatic mass surgery    . NASAL TURBINATE REDUCTION      Family history:  Family History  Problem Relation Age of Onset  . Hypertension Mother   . Asthma Sister   . Colon cancer Neg Hx   . Allergic rhinitis Neg Hx   . Eczema Neg Hx     Social history:  Social History Narrative   Paula [name] lives in a 72 year old apartment with no concern for water damage or mildew.  Flooring is carpet throughout.  Heating is electric and cooling is central.  There are no animals located  in the home, however, there is 1 dog across the hallway.  There are no roaches in the home and there are no dust mite free covers on the bed or pillow.  She is exposed to tobacco smoke in her home.  There are no concerns for exposure to fumes, chemicals, or dust in the home.    Medication List: Allergies as of 10/01/2017      Reactions   Dust Mite Extract Cough   Ace Inhibitors Cough   chronic cough   Lovastatin Other (See Comments)   Generalized body pain      Medication List        Accurate as of 10/01/17 10:53 PM. Always use your most recent med list.          acetaminophen 500 MG tablet Commonly known as:  TYLENOL Take 500 mg by mouth every 6 (six) hours as needed for headache.   albuterol 108 (90 Base) MCG/ACT inhaler Commonly known as:  PROVENTIL HFA;VENTOLIN HFA Inhale 2 puffs into the lungs every 6 (six) hours as needed. Shortness of breath   aspirin 81 MG tablet Take 81 mg by mouth daily.   azelastine 0.1 % nasal spray Commonly known as:  ASTELIN Place 2 sprays into both nostrils 2 (two) times daily.   calcium carbonate 1500 (600  Ca) MG Tabs tablet Commonly known as:  OSCAL Take 1,500 mg by mouth daily with breakfast.   cetirizine 10 MG tablet Commonly known as:  ZYRTEC Take 1 tablet (10 mg total) by mouth daily.   CHEST RUB EX Apply 1 application topically daily as needed (cough).   glipiZIDE 10 MG tablet Commonly known as:  GLUCOTROL Take 1 tablet (10 mg total) by mouth 2 (two) times daily before a meal.   glucose blood test strip Commonly known as:  TRUETEST TEST USE TO CHECK BLOOD AS DIRECTED THREE TIMES DAILY   HYDROcodone-homatropine 5-1.5 MG/5ML syrup Commonly known as:  HYCODAN Take 5 mLs by mouth every 12 (twelve) hours as needed for cough.   isosorbide mononitrate 60 MG 24 hr tablet Commonly known as:  IMDUR Take 1 tablet (60 mg total) by mouth daily.   levocetirizine 5 MG tablet Commonly known as:  XYZAL Take 1 tablet (5 mg total) by  mouth every evening.   Melatonin 10 MG Tabs Take 10 mg by mouth at bedtime as needed (sleep).   mometasone 50 MCG/ACT nasal spray Commonly known as:  NASONEX Place 2 sprays into the nose daily as needed (allergies).   montelukast 10 MG tablet Commonly known as:  SINGULAIR Take 1 tablet (10 mg total) by mouth at bedtime.   pantoprazole 40 MG tablet Commonly known as:  PROTONIX Take 1 tablet (40 mg total) by mouth daily.   pseudoephedrine-guaifenesin 60-600 MG 12 hr tablet Commonly known as:  MUCINEX D Take 1 tablet by mouth every 12 (twelve) hours as needed for congestion.   ranitidine 300 MG capsule Commonly known as:  ZANTAC Take 1 capsule (300 mg total) by mouth every evening.   rosuvastatin 5 MG tablet Commonly known as:  CRESTOR Take 1 tablet (5 mg total) by mouth daily.   SYSTANE 0.4-0.3 % Soln Generic drug:  Polyethyl Glycol-Propyl Glycol Place 1 drop into both eyes 2 (two) times daily as needed (dry eyes).   SYSTANE BALANCE 0.6 % Soln Generic drug:  Propylene Glycol Apply 1 drop to eye daily.   verapamil 80 MG tablet Commonly known as:  CALAN Take 1 tablet (80 mg total) by mouth 2 (two) times daily.       Known medication allergies: Allergies  Allergen Reactions  . Dust Mite Extract Cough  . Ace Inhibitors Cough    chronic cough  . Lovastatin Other (See Comments)    Generalized body pain     Physical examination: Blood pressure 138/70, pulse 64, temperature 98.1 F (36.7 C), temperature source Oral, resp. rate 20, height 5\' 1"  (1.549 m), weight 150 lb 6.4 oz (68.2 kg).  General: Alert, interactive, in no acute distress. HEENT: TMs pearly gray, turbinates non-edematous without discharge, post-pharynx mildly erythematous. Neck: Supple without lymphadenopathy. Lungs: Clear to auscultation without wheezing, rhonchi or rales. {no increased work of breathing. CV: Normal S1, S2 without murmurs. Abdomen: Nondistended, nontender. Skin: Warm and dry,  without lesions or rashes. Extremities:  No clubbing, cyanosis or edema. Neuro:   Grossly intact.  Diagnositics/Labs:  Spirometry: Spirometry not done due to continuous cough.   Assessment and plan:   1. Chronic rhinitis Stop Nasonex for now. Use this medication if you have a stuffy nose. Begin Astelin nasal spray 2 sprays in each nostril twice a day for nasal drainage/PND Stop Zyrtec Begin Xyzal 2.5 mg once a day (due to history of stage 3 kidney diseaes)  2. Seasonal allergic conjunctivitis Pazeo eye drops once a day as  needed for itchy watery eyes  3. GERD Stop pantoprazole Begin ranitidine 300 mg once a day Avoid all forms of caffeine  Follow up in 2 months or sooner if needed.  If this treatment plan is not effective we will refer to ear nose and throat specialist for further evaluation.  Gareth Morgan, FNP [facility]  I appreciate the opportunity to take part in Paula [name]'s care. Please do not hesitate to contact me with questions.  I performed/discussed the history and physical examination of the patient as well as management with NP Buna Cuppett. I reviewed the NP's note and agree with the documented findings and plan of care with following additions/exceptions: none  Sincerely,   Prudy Feeler, MD Allergy/Immunology [facility]

## 2017-10-01 NOTE — Telephone Encounter (Signed)
Patient as called and patient states that she is on the bus and could not understand me. Patient will return phone call for lab results.  Kidney function has declined slightly compared to previous probably due to contrast which she received. Please advise increase intake of fluids and follow-up with nephrologist.

## 2017-10-01 NOTE — Patient Instructions (Addendum)
1. Chronic rhinitis Stop Nasonex for now. Use this medication if you have a stuffy nose. Begin Astelin nasal spray 2 sprays in each nostril twice a day  Stop Zyrtec Begin Xyzal 5 mg once a day  2. Seasonal allergic conjunctivitis Pazeo eye drops once a day as needed for itchy watery eyes  3. GERD Stop pantoprazole Begin ranitidine 300 mg once a day Avoid all forms of caffeine  Follow up in 2 months or sooner if needed.  If this treatment plan is not effective we will refer to ear nose and throat specialist for further evaluation.

## 2017-10-02 ENCOUNTER — Telehealth: Payer: Self-pay | Admitting: Family Medicine

## 2017-10-02 NOTE — Telephone Encounter (Signed)
Patient called and I told  the patient Kidney function has declined slightly compared to previous probably due to contrast which she received. Please advise increase intake of fluids and follow-up with nephrologist.

## 2017-10-08 ENCOUNTER — Other Ambulatory Visit: Payer: Self-pay | Admitting: Family Medicine

## 2017-10-08 ENCOUNTER — Inpatient Hospital Stay: Payer: BLUE CROSS/BLUE SHIELD | Admitting: Emergency Medicine

## 2017-10-08 ENCOUNTER — Other Ambulatory Visit: Payer: Self-pay | Admitting: Pharmacist

## 2017-10-08 DIAGNOSIS — Z889 Allergy status to unspecified drugs, medicaments and biological substances status: Secondary | ICD-10-CM

## 2017-10-08 MED ORDER — GLUCOSE BLOOD VI STRP: ORAL_STRIP | 5 refills | Status: DC

## 2017-10-08 NOTE — Telephone Encounter (Signed)
Please send albuterol inhaler and strips to [facility] on [address]

## 2017-10-09 ENCOUNTER — Other Ambulatory Visit: Payer: Self-pay | Admitting: Pharmacist

## 2017-10-09 ENCOUNTER — Telehealth: Payer: Self-pay | Admitting: Emergency Medicine

## 2017-10-09 DIAGNOSIS — Z889 Allergy status to unspecified drugs, medicaments and biological substances status: Secondary | ICD-10-CM

## 2017-10-09 MED ORDER — ALBUTEROL SULFATE HFA 108 (90 BASE) MCG/ACT IN AERS: INHALATION_SPRAY | RESPIRATORY_TRACT | 0 refills | Status: DC

## 2017-10-09 MED ORDER — GLUCOSE BLOOD VI STRP: ORAL_STRIP | 12 refills | Status: DC

## 2017-10-09 NOTE — Telephone Encounter (Signed)
Went to the lobby to speak to pt about her appt she was supposed to have had yesterday, 10/08/17 with Dr. Lamonte Sakai when she no showed for that appt.  Pt stated she has had memory loss and due to not receiving a phone call about that appt, she forgot the appt was scheduled.  I told pt we could get her in Monday, 10/12/17 with Dr. Melvyn Novas at 3:15 for a HFU.  Pt expressed understanding. appt scheduled. Nothing further needed at this current time.

## 2017-10-12 ENCOUNTER — Ambulatory Visit (INDEPENDENT_AMBULATORY_CARE_PROVIDER_SITE_OTHER): Payer: BLUE CROSS/BLUE SHIELD | Admitting: Internal Medicine

## 2017-10-12 ENCOUNTER — Encounter: Payer: Self-pay | Admitting: Internal Medicine

## 2017-10-12 ENCOUNTER — Ambulatory Visit (INDEPENDENT_AMBULATORY_CARE_PROVIDER_SITE_OTHER)
Admission: RE | Admit: 2017-10-12 | Discharge: 2017-10-12 | Disposition: A | Discharge status: Home / Self Care | Payer: BLUE CROSS/BLUE SHIELD | Source: Ambulatory Visit | Attending: Internal Medicine | Admitting: Internal Medicine

## 2017-10-12 ENCOUNTER — Other Ambulatory Visit (INDEPENDENT_AMBULATORY_CARE_PROVIDER_SITE_OTHER): Payer: BLUE CROSS/BLUE SHIELD

## 2017-10-12 VITALS — BP 166/94 | HR 66 | Ht 61.0 in | Wt 150.0 lb

## 2017-10-12 DIAGNOSIS — R05 Cough: Secondary | ICD-10-CM

## 2017-10-12 DIAGNOSIS — R058 Other specified cough: Secondary | ICD-10-CM

## 2017-10-12 LAB — CBC WITH DIFFERENTIAL/PLATELET
Basophils Absolute: 0.1 10*3/uL (ref 0.0–0.1)
Basophils Relative: 0.9 % (ref 0.0–3.0)
Eosinophils Absolute: 0.2 10*3/uL (ref 0.0–0.7)
Eosinophils Relative: 4.1 % (ref 0.0–5.0)
HCT: 36.7 % (ref 36.0–46.0)
Hemoglobin: 12.6 g/dL (ref 12.0–15.0)
Lymphocytes Relative: 30.5 % (ref 12.0–46.0)
Lymphs Abs: 1.7 10*3/uL (ref 0.7–4.0)
MCHC: 34.2 g/dL (ref 30.0–36.0)
MCV: 84 fl (ref 78.0–100.0)
Monocytes Absolute: 0.4 10*3/uL (ref 0.1–1.0)
Monocytes Relative: 8 % (ref 3.0–12.0)
Neutro Abs: 3.1 10*3/uL (ref 1.4–7.7)
Neutrophils Relative %: 56.5 % (ref 43.0–77.0)
Platelets: 188 10*3/uL (ref 150.0–400.0)
RBC: 4.37 Mil/uL (ref 3.87–5.11)
RDW: 14 % (ref 11.5–15.5)
WBC: 5.6 10*3/uL (ref 4.0–10.5)

## 2017-10-12 MED ORDER — GABAPENTIN 100 MG PO CAPS
100.0000 mg | ORAL_CAPSULE | Freq: Three times a day (TID) | ORAL | 2 refills | Status: DC
Start: 2017-10-12 — End: 2018-01-11

## 2017-10-12 MED ORDER — RANITIDINE HCL 300 MG PO CAPS: ORAL_CAPSULE | ORAL | Status: DC

## 2017-10-12 NOTE — Patient Instructions (Addendum)
Gabapentin 100 mg three times daily   For drainage / throat tickle try take CHLORPHENIRAMINE  4 mg - take one every 4 hours as needed - available over the counter- may cause drowsiness so start with just a bedtime dose or two and see how you tolerate it before trying in daytime    Zantac 300 mg one half after bfast and one half after supper    GERD (REFLUX)  is an extremely common cause of respiratory symptoms just like yours , many times with no obvious heartburn at all.    It can be treated with medication, but also with lifestyle changes including elevation of the head of your bed (ideally with 6 inch  bed blocks),  Smoking cessation, avoidance of late meals, excessive alcohol, and avoid fatty foods, chocolate, peppermint, colas, red wine, and acidic juices such as orange juice.  NO MINT OR MENTHOL PRODUCTS SO NO COUGH DROPS  USE SUGARLESS CANDY INSTEAD (Jolley ranchers or Stover's or Life Savers) or even ice chips will also do - the key is to swallow to prevent all throat clearing. NO OIL BASED VITAMINS - use powdered substitutes.     Please remember to go to the lab and x-ray department downstairs in the basement  for your tests - we will call you with the results when they are available.     Follow up with Dr Lamonte Sakai in one month but you must bring every one of your medications with you

## 2017-10-12 NOTE — Progress Notes (Signed)
Subjective:     Patient ID: Paula [name], female   DOB: November 09, 1945, 72 y.o.   MRN: [id number]  HPI  41 yobf from [place] never smoker ollwed by Dr Lamonte Sakai p Dr Joya Gaskins with cough since 1993 attributed to gerd / irritable larynx (esp due to smells)  eg diesel smells/ perfume / dusty conditions    Last eval by Joya Gaskins 01/11/13 rx tramadol prn  Last eval by [facility]rum 07/14/17  Please start Nasonex 2 sprays each nostril once a day Please start pantoprazole 40 mg once a day.  Take this medication 30-60 minutes before or after eating.  Depending on your improvement on these medications we may need to do further evaluation including possible pulmonary function testing, chest x-ray.     10/12/2017 acute  office visit/ Lakeita Panther  Re cough since 1993/ Requesting tramadol Chief Complaint  Patient presents with  . Follow-up    cough caused by an unpleasant feeling in her throat, like a lump, clear mucus  apparently doing fine until ran out of tramadol and now feels throat is closing up with severe cough but no dysphagia and only sob during coughing fist assoc with incont and sense of choking 2/4/7  - no better with saba /only tramadol  No obvious day to day or daytime variability or assoc excess/ purulent sputum or mucus plugs or hemoptysis or cp or chest tightness, subjective wheeze or overt sinus or hb symptoms. No unusual exposure hx or h/o childhood pna/ asthma or knowledge of premature birth.  Also denies any obvious fluctuation of symptoms with weather or environmental changes or other aggravating or alleviating factors except as outlined above   Current Allergies, Complete Past Medical History, Past Surgical History, Family History, and Social History were reviewed in [employer] record.  ROS  The following are not active complaints unless bolded Hoarseness, sore throat, dysphagia, dental problems, itching, sneezing,  nasal congestion or discharge of excess mucus or purulent  secretions, ear ache,   fever, chills, sweats, unintended wt loss or wt gain, classically pleuritic or exertional cp,  orthopnea pnd or leg swelling, presyncope, palpitations, abdominal pain, anorexia, nausea, vomiting, diarrhea  or change in bowel habits or change in bladder habits, change in stools or change in urine, dysuria, hematuria,  rash, arthralgias, visual complaints, headache, numbness, weakness or ataxia or problems with walking or coordination,  change in mood/affect or memory.        Current Meds - not able to confirm she's taking any of these meds   Medication Sig  . acetaminophen (TYLENOL) 500 MG tablet Take 500 mg by mouth every 6 (six) hours as needed for headache.  . albuterol (PROVENTIL HFA;VENTOLIN HFA) 108 (90 Base) MCG/ACT inhaler INHALE TWO PUFFS INTO LUNGS EVERY 6 HOURS AS NEEDED FOR SHORTNESS OF BREATH  . aspirin 81 MG tablet Take 81 mg by mouth daily.    . calcium carbonate (OSCAL) 1500 (600 Ca) MG TABS tablet Take 1,500 mg by mouth daily with breakfast.  . Camphor-Eucalyptus-Menthol (CHEST RUB EX) Apply 1 application topically daily as needed (cough).  . cetirizine (ZYRTEC) 10 MG tablet Take 1 tablet (10 mg total) by mouth daily.  Marland Kitchen glipiZIDE (GLUCOTROL) 10 MG tablet Take 1 tablet (10 mg total) by mouth 2 (two) times daily before a meal.  . glucose blood (TRUE METRIX BLOOD GLUCOSE TEST) test strip Use as instructed TID  . isosorbide mononitrate (IMDUR) 60 MG 24 hr tablet Take 1 tablet (60 mg total) by mouth daily.  Marland Kitchen  levocetirizine (XYZAL) 5 MG tablet Take 1 tablet (5 mg total) by mouth every evening.  . mometasone (NASONEX) 50 MCG/ACT nasal spray Place 2 sprays into the nose daily as needed (allergies).   . montelukast (SINGULAIR) 10 MG tablet Take 1 tablet (10 mg total) by mouth at bedtime.  Vladimir Faster Glycol-Propyl Glycol (SYSTANE) 0.4-0.3 % SOLN Place 1 drop into both eyes 2 (two) times daily as needed (dry eyes).   . Propylene Glycol (SYSTANE BALANCE) 0.6 % SOLN  Apply 1 drop to eye daily.  . ranitidine (ZANTAC) 300 MG capsule One half after bfast and one half after supper  . rosuvastatin (CRESTOR) 5 MG tablet Take 1 tablet (5 mg total) by mouth daily.  . verapamil (CALAN) 80 MG tablet Take 1 tablet (80 mg total) by mouth 2 (two) times daily.  . [DISCONTINUED] ranitidine (ZANTAC) 300 MG capsule Take 1 capsule (300 mg total) by mouth every evening.         Review of Systems     Objective:   Physical Exam    animated hoarse bf clearing throat constantly    Wt Readings from Last 3 Encounters:  10/12/17 150 lb (68 kg)  10/01/17 150 lb 6.4 oz (68.2 kg)  09/24/17 151 lb 9.6 oz (68.8 kg)     Vital signs reviewed - Note on arrival 02 sats  98% on RA   HEENT: nl dentition, turbinates bilaterally, and oropharynx which is pristine. Nl external ear canals without cough reflex   NECK :  without JVD/Nodes/TM/ nl carotid upstrokes bilaterally   LUNGS: no acc muscle use,  Nl contour chest which is clear to A and P bilaterally without cough on insp or exp maneuvers   CV:  RRR  no s3 or murmur or increase in P2, and no edema   ABD:  soft and nontender with nl inspiratory excursion in the supine position. No bruits or organomegaly appreciated, bowel sounds nl  MS:  Nl gait/ ext warm without deformities, calf tenderness, cyanosis or clubbing No obvious joint restrictions   SKIN: warm and dry without lesions    NEURO:  alert, approp, nl sensorium with  no motor or cerebellar deficits apparent.     CXR PA and Lateral:   10/12/2017 :    I personally reviewed images and   impression as follows:    wnl  Labs ordered 10/12/2017  Allergy profile      Assessment:

## 2017-10-13 ENCOUNTER — Encounter: Payer: Self-pay | Admitting: Internal Medicine

## 2017-10-13 LAB — RESPIRATORY ALLERGY PROFILE REGION II ~~LOC~~

## 2017-10-13 LAB — INTERPRETATION:

## 2017-10-13 NOTE — Progress Notes (Signed)
Spoke with pt and notified of results per Dr. Wert. Pt verbalized understanding and denied any questions. 

## 2017-10-13 NOTE — Assessment & Plan Note (Addendum)
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   These are the same pts (now being labeled as having "irritable larynx syndrome" by some cough centers) who not infrequently have a history of having failed to tolerate ace inhibitors,  dry powder inhalers or biphosphonates or report having atypical/extraesophageal reflux symptoms that don't respond to standard doses of PPI  and are easily confused as having aecopd or asthma flares by even experienced allergists/ pulmonologists (myself included).    rec trial of gabapentin and if use tramadol it all it should only be used for short courses for cyclical cough: Of the three most common causes of  Sub-acute or recurrent or chronic cough, only one (GERD)  can actually contribute to/ trigger  the other two (asthma and post nasal drip syndrome)  and perpetuate the cylce of cough.  While not intuitively obvious, many patients with chronic low grade reflux do not cough until there is a primary insult that disturbs the protective epithelial barrier and exposes sensitive nerve endings.   This is typically viral but can be direct physical injury such as with an endotracheal tube.   The point is that once this occurs, it is difficult to eliminate the cycle  using anything but a maximally effective acid suppression regimen at least in the short run, accompanied by an appropriate  diet to address non acid GERD and control / eliminate the cough itself for at least 3 days with trmadol and add  1st gen H1 blockers per guidelines     I had an extended discussion with the patient reviewing all relevant studies completed to date and  lasting 25 minutes of a 40  minute acute office  visit  With pt new to me     re  severe non-specific but potentially very serious refractory respiratory symptoms of uncertain and potentially multiple  etiologies.   Each maintenance medication was reviewed in detail including most importantly the difference between maintenance and prns and under what circumstances the prns are to be triggered using an action plan format that is not reflected in the computer generated alphabetically organized AVS.    Please see AVS for specific instructions unique to this office visit that I personally wrote and verbalized to the the pt in detail and then reviewed with pt  by my nurse highlighting any changes in therapy/plan of care  recommended at today's visit.

## 2017-10-13 NOTE — Progress Notes (Signed)
Pt aware.

## 2017-10-16 ENCOUNTER — Telehealth: Payer: Self-pay | Admitting: Family Medicine

## 2017-10-16 NOTE — Telephone Encounter (Signed)
Patient returned my call and stated that she was evaluated and got approved for [facility].

## 2017-10-16 NOTE — Telephone Encounter (Signed)
Call placed to patient regarding her SCAT application. No answer. Left patient a message asking her to return my call at [phone].  Last time I spoke with patient, she mentioned that she missed [facility]'s call, but that she would contact them to schedule her evaluation.

## 2017-10-19 ENCOUNTER — Ambulatory Visit (HOSPITAL_COMMUNITY)
Admission: RE | Admit: 2017-10-19 | Discharge: 2017-10-19 | Disposition: A | Discharge status: Home / Self Care | Payer: BLUE CROSS/BLUE SHIELD | Source: Ambulatory Visit | Attending: Interventional Radiology | Admitting: Interventional Radiology

## 2017-10-19 DIAGNOSIS — I729 Aneurysm of unspecified site: Secondary | ICD-10-CM

## 2017-10-19 HISTORY — PX: IR RADIOLOGIST EVAL & MGMT: IMG5224

## 2017-10-20 ENCOUNTER — Encounter (HOSPITAL_COMMUNITY): Payer: Self-pay | Admitting: Interventional Radiology

## 2017-10-21 ENCOUNTER — Ambulatory Visit: Payer: BLUE CROSS/BLUE SHIELD | Attending: Family Medicine | Admitting: Family Medicine

## 2017-10-21 ENCOUNTER — Encounter: Payer: Self-pay | Admitting: Family Medicine

## 2017-10-21 VITALS — BP 147/73 | HR 73 | Temp 97.4°F | Ht 61.0 in | Wt 148.0 lb

## 2017-10-21 DIAGNOSIS — Z86010 Personal history of colonic polyps: Secondary | ICD-10-CM | POA: Insufficient documentation

## 2017-10-21 DIAGNOSIS — E1142 Type 2 diabetes mellitus with diabetic polyneuropathy: Secondary | ICD-10-CM | POA: Diagnosis not present

## 2017-10-21 DIAGNOSIS — K219 Gastro-esophageal reflux disease without esophagitis: Secondary | ICD-10-CM | POA: Insufficient documentation

## 2017-10-21 DIAGNOSIS — R05 Cough: Secondary | ICD-10-CM | POA: Diagnosis present

## 2017-10-21 DIAGNOSIS — I10 Essential (primary) hypertension: Secondary | ICD-10-CM | POA: Diagnosis not present

## 2017-10-21 DIAGNOSIS — R058 Other specified cough: Secondary | ICD-10-CM

## 2017-10-21 DIAGNOSIS — N183 Chronic kidney disease, stage 3 unspecified: Secondary | ICD-10-CM

## 2017-10-21 DIAGNOSIS — Z79899 Other long term (current) drug therapy: Secondary | ICD-10-CM | POA: Diagnosis not present

## 2017-10-21 DIAGNOSIS — E1122 Type 2 diabetes mellitus with diabetic chronic kidney disease: Secondary | ICD-10-CM | POA: Diagnosis present

## 2017-10-21 DIAGNOSIS — E7800 Pure hypercholesterolemia, unspecified: Secondary | ICD-10-CM | POA: Diagnosis not present

## 2017-10-21 DIAGNOSIS — I729 Aneurysm of unspecified site: Secondary | ICD-10-CM | POA: Diagnosis not present

## 2017-10-21 DIAGNOSIS — Z7982 Long term (current) use of aspirin: Secondary | ICD-10-CM | POA: Insufficient documentation

## 2017-10-21 DIAGNOSIS — R51 Headache: Secondary | ICD-10-CM | POA: Diagnosis not present

## 2017-10-21 DIAGNOSIS — E119 Type 2 diabetes mellitus without complications: Secondary | ICD-10-CM | POA: Diagnosis not present

## 2017-10-21 DIAGNOSIS — I130 Hypertensive heart and chronic kidney disease with heart failure and stage 1 through stage 4 chronic kidney disease, or unspecified chronic kidney disease: Secondary | ICD-10-CM | POA: Insufficient documentation

## 2017-10-21 LAB — GLUCOSE, POCT (MANUAL RESULT ENTRY): POC Glucose: 191 mg/dl — AB (ref 70–99)

## 2017-10-21 MED ORDER — CARVEDILOL 3.125 MG PO TABS: 3.1250 mg | ORAL_TABLET | Freq: Two times a day (BID) | ORAL | 3 refills | Status: DC

## 2017-10-21 NOTE — Progress Notes (Signed)
Subjective:  Patient ID: Paula [name], female    DOB: 06-14-1946  Age: 72 y.o. 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 She was last seen by interventional radiology on 10/19/17 with recommendations for optimization of glycemic control and blood pressure control with a goal of [phone] systolic blood pressure, pulmonary risk assessment for general anesthesia. She continues to have headache whenever she coughs and has to tie her head with a strong band to control her symptoms.  Last seen by [facility]-Dr. Melvyn Novas on 10/12/17 due to chronic cough with postnasal drip and diagnosed with severe upper airway cough syndrome for which she was placed on chlorpheniramine, gabapentin and ranitidine.  She is unsure if her symptoms have improved as she coughs intermittently with frequent clearing of her throat and postnasal drip but has an upcoming appointment in 1 month with pulmonary.  She has a blood sugar log with her which reveals sugars have improved and fasting sugars in the low 100s.  Her daughter now prepares her meals and she is limited to  silence in the car modified diet.      Past Medical History:  Diagnosis Date  . Allergic rhinitis   . Arthritis   . Cough   . Diabetes mellitus   . Diastolic CHF, chronic ([place]) 10/11/2011  . GERD (gastroesophageal reflux disease)   . History of colon polyps 2012   tubular adenoma   . Hyperlipidemia   . Hypertension   . Neuropathy 10/11/2011  . PONV (postoperative nausea and vomiting)    trouble waking up    Past Surgical History:  Procedure Laterality Date  . CATARACT EXTRACTION     right eye  . IR ANGIO INTRA EXTRACRAN SEL COM CAROTID INNOMINATE BILAT MOD SED  09/15/2017  . IR ANGIO VERTEBRAL SEL VERTEBRAL BILAT MOD SED  09/15/2017  . IR RADIOLOGIST EVAL & MGMT  09/10/2017  . IR RADIOLOGIST EVAL & MGMT  10/19/2017  . lymphatic mass surgery    . NASAL TURBINATE REDUCTION      Allergies  Allergen Reactions  . Dust Mite Extract Cough  . Ace Inhibitors Cough    chronic cough  . Lovastatin Other (See Comments)    Generalized body pain     Outpatient Medications Prior to Visit  Medication Sig Dispense Refill  . acetaminophen (TYLENOL) 500 MG tablet Take 500 mg by mouth every 6 (six) hours as needed for headache.    . albuterol (PROVENTIL HFA;VENTOLIN HFA) 108 (90 Base) MCG/ACT inhaler INHALE TWO PUFFS INTO LUNGS EVERY 6 HOURS AS NEEDED FOR SHORTNESS OF BREATH 1 Inhaler 0  . aspirin 81 MG tablet Take 81 mg by mouth daily.      . calcium carbonate (OSCAL) 1500 (600 Ca) MG TABS tablet Take 1,500 mg by mouth daily with breakfast.    . Camphor-Eucalyptus-Menthol (CHEST RUB EX) Apply 1 application topically daily as needed (cough).    . cetirizine (ZYRTEC) 10 MG tablet Take 1 tablet (10 mg total) by mouth daily. 90 tablet 1  . glipiZIDE (GLUCOTROL) 10 MG tablet Take 1 tablet (10 mg total) by mouth 2 (two) times daily before a meal. 60 tablet 3  .  glucose blood (TRUE METRIX BLOOD GLUCOSE TEST) test strip Use as instructed TID 100 each 12  . isosorbide mononitrate (IMDUR) 60 MG 24 hr tablet Take 1 tablet (60 mg total) by mouth daily. 90 tablet 1  . levocetirizine (XYZAL) 5 MG tablet Take 1 tablet (5 mg total) by mouth every evening. 30 tablet 5  . Polyethyl Glycol-Propyl Glycol (SYSTANE) 0.4-0.3 % SOLN Place 1 drop into both eyes 2 (two) times daily as needed (dry eyes).     . Propylene Glycol (SYSTANE BALANCE) 0.6 % SOLN Apply 1 drop to eye daily.    . ranitidine (ZANTAC) 300 MG capsule One half after bfast and one half after supper    . rosuvastatin (CRESTOR) 5 MG tablet Take 1  tablet (5 mg total) by mouth daily. 90 tablet 1  . verapamil (CALAN) 80 MG tablet Take 1 tablet (80 mg total) by mouth 2 (two) times daily. 180 tablet 1  . gabapentin (NEURONTIN) 100 MG capsule Take 1 capsule (100 mg total) by mouth 3 (three) times daily. One three times daily (Patient not taking: Reported on 10/21/2017) 90 capsule 2  . mometasone (NASONEX) 50 MCG/ACT nasal spray Place 2 sprays into the nose daily as needed (allergies).   0  . montelukast (SINGULAIR) 10 MG tablet Take 1 tablet (10 mg total) by mouth at bedtime. (Patient not taking: Reported on 10/21/2017) 30 tablet 1  . pantoprazole (PROTONIX) 40 MG tablet Take 1 tablet (40 mg total) by mouth daily. (Patient not taking: Reported on 10/12/2017) 30 tablet 5  . pseudoephedrine-guaifenesin (MUCINEX D) 60-600 MG 12 hr tablet Take 1 tablet by mouth every 12 (twelve) hours as needed for congestion.     No facility-administered medications prior to visit.     ROS Review of Systems  Constitutional: Negative for activity change, appetite change and fatigue.  HENT: Negative for congestion, sinus pressure and sore throat.   Eyes: Negative for visual disturbance.  Respiratory: Positive for cough. Negative for chest tightness, shortness of breath and wheezing.   Cardiovascular: Negative for chest pain and palpitations.  Gastrointestinal: Negative for abdominal distention, abdominal pain and constipation.  Endocrine: Negative for polydipsia.  Genitourinary: Negative for dysuria and frequency.  Musculoskeletal: Negative for arthralgias and back pain.  Skin: Negative for rash.  Neurological: Positive for headaches. Negative for tremors, light-headedness and numbness.  Hematological: Does not bruise/bleed easily.  Psychiatric/Behavioral: Negative for agitation and behavioral problems.    Objective:  BP (!) 147/73   Pulse 73   Temp (!) 97.4 F (36.3 C) (Oral)   Ht 5\' 1"  (1.549 m)   Wt 148 lb (67.1 kg)   LMP  (LMP Unknown)   SpO2 99%    BMI 27.96 kg/m   BP/Weight 10/21/2017 10/12/2017 04/08/5620  Systolic BP 308 657 846  Diastolic BP 73 94 70  Wt. (Lbs) 148 150 150.4  BMI 27.96 28.34 28.42      Physical Exam  Constitutional: She is oriented to person, place, and time. She appears well-developed and well-nourished.  HENT:  Right Ear: External ear normal.  Left Ear: External ear normal.  Postnasal drip  Cardiovascular: Normal rate, normal heart sounds and intact distal pulses.  No murmur heard. Pulmonary/Chest: Effort normal and breath sounds normal. She has no wheezes. She has no rales. She exhibits no tenderness.  Abdominal: Soft. Bowel sounds are normal. She exhibits no distension and no mass. There is no tenderness.  Musculoskeletal: Normal range of motion.  Neurological: She is alert and oriented  to person, place, and time.     Lab Results  Component Value Date   HGBA1C 8.3 08/20/2017    Assessment & Plan:   1. Type 2 diabetes mellitus without complication, without long-term current use of insulin (HCC) Not fully optimized with A1c of 8.3 Blood sugar log reveals improvement  - POCT glucose (manual entry)  2. Essential hypertension uncontrolled Carvedilol added to regimen No regimen change today - carvedilol (COREG) 3.125 MG tablet; Take 1 tablet (3.125 mg total) by mouth 2 (two) times daily with a meal.  Dispense: 60 tablet; Refill: 3  3. CKD (chronic kidney disease) stage 3, GFR 30-59 ml/min (HCC) Stable Managed by nephrology  4. Aneurysm [facility]) Currently followed by interventional radiology plans for anticipated repair once blood pressure, diabetes optimal and clearance received from pulmonary.  5.  Upper airway cough syndrome Continue gabapentin, chlorpheniramine, ranitidine as per pulmonary  Meds ordered this encounter  Medications  . carvedilol (COREG) 3.125 MG tablet    Sig: Take 1 tablet (3.125 mg total) by mouth 2 (two) times daily with a meal.    Dispense:  60 tablet    Refill:  3      Follow-up: Return for follow up of chronic meical conditions keep previously scheduled appointment.   Charlott Rakes MD

## 2017-10-21 NOTE — Patient Instructions (Signed)

## 2017-10-30 ENCOUNTER — Telehealth: Payer: Self-pay | Admitting: Podiatry

## 2017-10-30 NOTE — Telephone Encounter (Signed)
Called pt to discuss diabetic shoe/insert coverage and she does not have voicemail set up to be able to leave a message. But diabetic shoes/inserts are not covered under her plan.

## 2017-11-09 ENCOUNTER — Telehealth: Payer: Self-pay | Admitting: Family Medicine

## 2017-11-09 MED FILL — TRUE METRIX TEST STRIP: 30 days supply | Qty: 100 | Fill #0

## 2017-11-16 ENCOUNTER — Encounter: Payer: Self-pay | Admitting: Emergency Medicine

## 2017-11-16 ENCOUNTER — Ambulatory Visit (INDEPENDENT_AMBULATORY_CARE_PROVIDER_SITE_OTHER): Payer: BLUE CROSS/BLUE SHIELD | Admitting: Emergency Medicine

## 2017-11-16 ENCOUNTER — Telehealth: Payer: Self-pay | Admitting: Emergency Medicine

## 2017-11-16 VITALS — BP 120/86 | HR 64 | Ht 62.0 in | Wt 149.0 lb

## 2017-11-16 DIAGNOSIS — R059 Cough, unspecified: Secondary | ICD-10-CM

## 2017-11-16 DIAGNOSIS — R05 Cough: Secondary | ICD-10-CM

## 2017-11-16 NOTE — Telephone Encounter (Signed)
[facility] dept closes at 4:30.  Will call back 11/17/17.

## 2017-11-16 NOTE — H&P (View-Only) (Signed)
Subjective:    Patient ID: Paula [name], female    DOB: 08-17-1945, 72 y.o.   MRN: [id number]  Cough  Associated symptoms include postnasal drip, a sore throat, shortness of breath and wheezing. Pertinent negatives include no rash.   72 year old never smoker with a history of diabetes, hypertension with diastolic dysfunction, and chronic cough previously followed in our office by Dr. Joya [name].  She has a history of allergic rhinitis and GERD and it was felt that these were the most significant contributor to her coughing on her past evaluations.  She returns today describing an increase nasal drainage, cough that started about 2.5 weeks ago when she came off tramadol. She does not want to restart it. She is no longer on nasonex, was on an alternative nasal steroid that she stopped a month ago. She is no longer on a PPI.  Her cough is productive of clear mucous, she has a lot of nasal congestion.   ROV 11/16/17 --follow-up visit for patient with a history of diabetes, hypertension with diastolic dysfunction.  We have followed her for chronic cough in the setting of allergic rhinitis and GERD. I had her on nasal saline, protonix but she stopped. She used to benefit from tramadol, but this was stopped.   She was seen here about 1 month ago for upper airway irritation, globus sensation, and flaring of her cough.   She was started on chlorpheniramine as needed, Zantac, and was given gabapentin 100 mg 3 times daily. She apparently is no longer on these, if she took them at all.   She was already on Xyzal. CXR was done last time > no infiltrates noted. Planning for IR treatment brain aneurism w Dr Estanislado Pandy, needs surgical pre-op eval   Review of Systems  HENT: Positive for congestion, postnasal drip, sinus pressure, sneezing and sore throat.   Respiratory: Positive for cough, chest tightness, shortness of breath and wheezing.   Cardiovascular: Positive for palpitations.  Skin: Negative for rash.    Allergic/Immunologic: Negative.   Psychiatric/Behavioral: The patient is nervous/anxious.     Past Medical History:  Diagnosis Date  . Allergic rhinitis   . Arthritis   . Cough   . Diabetes mellitus   . Diastolic CHF, chronic ([place]) 10/11/2011  . GERD (gastroesophageal reflux disease)   . History of colon polyps 2012   tubular adenoma   . Hyperlipidemia   . Hypertension   . Neuropathy 10/11/2011  . PONV (postoperative nausea and vomiting)    trouble waking up     Family History  Problem Relation Age of Onset  . Hypertension Mother   . Asthma Sister   . Colon cancer Neg Hx   . Allergic rhinitis Neg Hx   . Eczema Neg Hx      Social History   Socioeconomic History  . Marital status: Widowed    Spouse name: Not on file  . Number of children: 4  . Years of education: Not on file  . Highest education level: Not on file  Occupational History  . Occupation: Unemployed  Social Needs  . Financial resource strain: Not on file  . Food insecurity:    Worry: Not on file    Inability: Not on file  . Transportation needs:    Medical: Not on file    Non-medical: Not on file  Tobacco Use  . Smoking status: Never Smoker  . Smokeless tobacco: Never Used  Substance and Sexual Activity  . Alcohol use: No  .  Drug use: No  . Sexual activity: Not Currently    Birth control/protection: None  Lifestyle  . Physical activity:    Days per week: Not on file    Minutes per session: Not on file  . Stress: Not on file  Relationships  . Social connections:    Talks on phone: Not on file    Gets together: Not on file    Attends religious service: Not on file    Active member of club or organization: Not on file    Attends meetings of clubs or organizations: Not on file    Relationship status: Not on file  . Intimate partner violence:    Fear of current or ex partner: Not on file    Emotionally abused: Not on file    Physically abused: Not on file    Forced sexual activity: Not on  file  Other Topics Concern  . Not on file  Social History Narrative   Paula [name] lives in a 72 year old apartment with no concern for water damage or mildew.  Flooring is carpet throughout.  Heating is electric and cooling is central.  There are no animals located in the home, however, there is 1 dog across the hallway.  There are no roaches in the home and there are no dust mite free covers on the bed or pillow.  She is exposed to tobacco smoke in her home.  There are no concerns for exposure to fumes, chemicals, or dust in the home.  Originally from [name], has lived [place], [place], [place] Has worked as Education officer, museum   Allergies  Allergen Reactions  . Dust Mite Extract Cough  . Ace Inhibitors Cough    chronic cough  . Lovastatin Other (See Comments)    Generalized body pain     Outpatient Medications Prior to Visit  Medication Sig Dispense Refill  . acetaminophen (TYLENOL) 500 MG tablet Take 500 mg by mouth every 6 (six) hours as needed for headache.    . albuterol (PROVENTIL HFA;VENTOLIN HFA) 108 (90 Base) MCG/ACT inhaler INHALE TWO PUFFS INTO LUNGS EVERY 6 HOURS AS NEEDED FOR SHORTNESS OF BREATH 1 Inhaler 0  . aspirin 81 MG tablet Take 81 mg by mouth daily.      . calcium carbonate (OSCAL) 1500 (600 Ca) MG TABS tablet Take 1,500 mg by mouth daily with breakfast.    . carvedilol (COREG) 3.125 MG tablet Take 1 tablet (3.125 mg total) by mouth 2 (two) times daily with a meal. 60 tablet 3  . glipiZIDE (GLUCOTROL) 10 MG tablet Take 1 tablet (10 mg total) by mouth 2 (two) times daily before a meal. 60 tablet 3  . isosorbide mononitrate (IMDUR) 60 MG 24 hr tablet Take 1 tablet (60 mg total) by mouth daily. 90 tablet 1  . levocetirizine (XYZAL) 5 MG tablet Take 1 tablet (5 mg total) by mouth every evening. 30 tablet 5  . Propylene Glycol (SYSTANE BALANCE) 0.6 % SOLN Apply 1 drop to eye daily.    . rosuvastatin (CRESTOR) 5 MG tablet Take 1 tablet (5 mg total) by mouth daily. 90 tablet 1  . verapamil (CALAN)  80 MG tablet Take 1 tablet (80 mg total) by mouth 2 (two) times daily. 180 tablet 1  . Camphor-Eucalyptus-Menthol (CHEST RUB EX) Apply 1 application topically daily as needed (cough).    . cetirizine (ZYRTEC) 10 MG tablet Take 1 tablet (10 mg total) by mouth daily. 90 tablet 1  . gabapentin (NEURONTIN) 100 MG capsule Take  1 capsule (100 mg total) by mouth 3 (three) times daily. One three times daily (Patient not taking: Reported on 10/21/2017) 90 capsule 2  . glucose blood (TRUE METRIX BLOOD GLUCOSE TEST) test strip Use as instructed TID 100 each 12  . mometasone (NASONEX) 50 MCG/ACT nasal spray Place 2 sprays into the nose daily as needed (allergies).   0  . montelukast (SINGULAIR) 10 MG tablet Take 1 tablet (10 mg total) by mouth at bedtime. (Patient not taking: Reported on 10/21/2017) 30 tablet 1  . pantoprazole (PROTONIX) 40 MG tablet Take 1 tablet (40 mg total) by mouth daily. (Patient not taking: Reported on 10/12/2017) 30 tablet 5  . Polyethyl Glycol-Propyl Glycol (SYSTANE) 0.4-0.3 % SOLN Place 1 drop into both eyes 2 (two) times daily as needed (dry eyes).     . pseudoephedrine-guaifenesin (MUCINEX D) 60-600 MG 12 hr tablet Take 1 tablet by mouth every 12 (twelve) hours as needed for congestion.    . ranitidine (ZANTAC) 300 MG capsule One half after bfast and one half after supper     No facility-administered medications prior to visit.         Objective:   Physical Exam  Vitals:   11/16/17 1139  BP: 120/86  Pulse: 64  SpO2: 97%  Weight: 149 lb (67.6 kg)  Height: 5\' 2"  (1.575 m)   Gen: Pleasant, well-nourished, in no distress,  normal affect  ENT: No lesions,  mouth clear,  oropharynx clear, no postnasal drip but some apparent congestion  Neck: No JVD, no stridor  Lungs: No use of accessory muscles, no wheezing, no crackles  Cardiovascular: RRR, heart sounds normal, no murmur or gallops, no peripheral edema  Musculoskeletal: No deformities, no cyanosis or clubbing  Neuro:  alert, non focal  Skin: Warm, no lesions or rashes                                                                           Assessment & Plan:  Cough Related to rhinitis and also GERD.  We have tried to treat both of these but she has been unreliable therapy.  She saw Dr. Melvyn Novas 1 month ago and he started therapy then but she did not take reliably.  He also started gabapentin to see if she would benefit but she is not on that either.  I believe she needs a bronchoscopy for airway inspection to rule out other causes.  She also needs pulmonary function testing to rule out obstructive lung disease.  If both of these are reassuring then we will have to push hard to improve her compliance with her GERD therapy, rhinitis therapy.  Please continue your chlorpheniramine 8mg  in the morning.  Continue xyzal (levoceterizine) once a day.  You are at low risk from pulmonary standpoint to get your aneurysm procedure done under general anesthesia.  We will set up a bronchoscopy to evaluate your throat and airways. You will need someone to drive you for the procedure as we will use sedation  We will set up full pulmonary function testing  Follow with Dr Lamonte Sakai in 1 month or next available.  Baltazar Apo, MD, PhD 11/16/2017, 12:09 PM [facility] [phone] or if no answer [phone]

## 2017-11-16 NOTE — Patient Instructions (Addendum)
Please continue your chlorpheniramine 8mg  in the morning.  Continue xyzal (levoceterizine) once a day.  You are at low risk from pulmonary standpoint to get your aneurysm procedure done under general anesthesia.  We will set up a bronchoscopy to evaluate your throat and airways. You will need someone to drive you for the procedure as we will use sedation  We will set up full pulmonary function testing  Follow with Dr Lamonte Sakai in 1 month or next available.

## 2017-11-16 NOTE — Progress Notes (Signed)
Subjective:    Patient ID: Paula [name], female    DOB: 1945/11/08, 72 y.o.   MRN: [id number]  Cough  Associated symptoms include postnasal drip, a sore throat, shortness of breath and wheezing. Pertinent negatives include no rash.   72 year old never smoker with a history of diabetes, hypertension with diastolic dysfunction, and chronic cough previously followed in our office by Dr. Joya [name].  She has a history of allergic rhinitis and GERD and it was felt that these were the most significant contributor to her coughing on her past evaluations.  She returns today describing an increase nasal drainage, cough that started about 2.5 weeks ago when she came off tramadol. She does not want to restart it. She is no longer on nasonex, was on an alternative nasal steroid that she stopped a month ago. She is no longer on a PPI.  Her cough is productive of clear mucous, she has a lot of nasal congestion.   ROV 11/16/17 --follow-up visit for patient with a history of diabetes, hypertension with diastolic dysfunction.  We have followed her for chronic cough in the setting of allergic rhinitis and GERD. I had her on nasal saline, protonix but she stopped. She used to benefit from tramadol, but this was stopped.   She was seen here about 1 month ago for upper airway irritation, globus sensation, and flaring of her cough.   She was started on chlorpheniramine as needed, Zantac, and was given gabapentin 100 mg 3 times daily. She apparently is no longer on these, if she took them at all.   She was already on Xyzal. CXR was done last time > no infiltrates noted. Planning for IR treatment brain aneurism w Dr Paula [name], needs surgical pre-op eval   Review of Systems  HENT: Positive for congestion, postnasal drip, sinus pressure, sneezing and sore throat.   Respiratory: Positive for cough, chest tightness, shortness of breath and wheezing.   Cardiovascular: Positive for palpitations.  Skin: Negative for rash.    Allergic/Immunologic: Negative.   Psychiatric/Behavioral: The patient is nervous/anxious.     Past Medical History:  Diagnosis Date  . Allergic rhinitis   . Arthritis   . Cough   . Diabetes mellitus   . Diastolic CHF, chronic ([place]) 10/11/2011  . GERD (gastroesophageal reflux disease)   . History of colon polyps 2012   tubular adenoma   . Hyperlipidemia   . Hypertension   . Neuropathy 10/11/2011  . PONV (postoperative nausea and vomiting)    trouble waking up     Family History  Problem Relation Age of Onset  . Hypertension Mother   . Asthma Sister   . Colon cancer Neg Hx   . Allergic rhinitis Neg Hx   . Eczema Neg Hx      Social History   Socioeconomic History  . Marital status: Widowed    Spouse name: Not on file  . Number of children: 4  . Years of education: Not on file  . Highest education level: Not on file  Occupational History  . Occupation: Unemployed  Social Needs  . Financial resource strain: Not on file  . Food insecurity:    Worry: Not on file    Inability: Not on file  . Transportation needs:    Medical: Not on file    Non-medical: Not on file  Tobacco Use  . Smoking status: Never Smoker  . Smokeless tobacco: Never Used  Substance and Sexual Activity  . Alcohol use: No  .  Drug use: No  . Sexual activity: Not Currently    Birth control/protection: None  Lifestyle  . Physical activity:    Days per week: Not on file    Minutes per session: Not on file  . Stress: Not on file  Relationships  . Social connections:    Talks on phone: Not on file    Gets together: Not on file    Attends religious service: Not on file    Active member of club or organization: Not on file    Attends meetings of clubs or organizations: Not on file    Relationship status: Not on file  . Intimate partner violence:    Fear of current or ex partner: Not on file    Emotionally abused: Not on file    Physically abused: Not on file    Forced sexual activity: Not on  file  Other Topics Concern  . Not on file  Social History Narrative   [name] lives in a 72 year old apartment with no concern for water damage or mildew.  Flooring is carpet throughout.  Heating is electric and cooling is central.  There are no animals located in the home, however, there is 1 dog across the hallway.  There are no roaches in the home and there are no dust mite free covers on the bed or pillow.  She is exposed to tobacco smoke in her home.  There are no concerns for exposure to fumes, chemicals, or dust in the home.  Originally from [name], has lived [place], [place], [place] Has worked as Education officer, museum   Allergies  Allergen Reactions  . Dust Mite Extract Cough  . Ace Inhibitors Cough    chronic cough  . Lovastatin Other (See Comments)    Generalized body pain     Outpatient Medications Prior to Visit  Medication Sig Dispense Refill  . acetaminophen (TYLENOL) 500 MG tablet Take 500 mg by mouth every 6 (six) hours as needed for headache.    . albuterol (PROVENTIL HFA;VENTOLIN HFA) 108 (90 Base) MCG/ACT inhaler INHALE TWO PUFFS INTO LUNGS EVERY 6 HOURS AS NEEDED FOR SHORTNESS OF BREATH 1 Inhaler 0  . aspirin 81 MG tablet Take 81 mg by mouth daily.      . calcium carbonate (OSCAL) 1500 (600 Ca) MG TABS tablet Take 1,500 mg by mouth daily with breakfast.    . carvedilol (COREG) 3.125 MG tablet Take 1 tablet (3.125 mg total) by mouth 2 (two) times daily with a meal. 60 tablet 3  . glipiZIDE (GLUCOTROL) 10 MG tablet Take 1 tablet (10 mg total) by mouth 2 (two) times daily before a meal. 60 tablet 3  . isosorbide mononitrate (IMDUR) 60 MG 24 hr tablet Take 1 tablet (60 mg total) by mouth daily. 90 tablet 1  . levocetirizine (XYZAL) 5 MG tablet Take 1 tablet (5 mg total) by mouth every evening. 30 tablet 5  . Propylene Glycol (SYSTANE BALANCE) 0.6 % SOLN Apply 1 drop to eye daily.    . rosuvastatin (CRESTOR) 5 MG tablet Take 1 tablet (5 mg total) by mouth daily. 90 tablet 1  . verapamil (CALAN)  80 MG tablet Take 1 tablet (80 mg total) by mouth 2 (two) times daily. 180 tablet 1  . Camphor-Eucalyptus-Menthol (CHEST RUB EX) Apply 1 application topically daily as needed (cough).    . cetirizine (ZYRTEC) 10 MG tablet Take 1 tablet (10 mg total) by mouth daily. 90 tablet 1  . gabapentin (NEURONTIN) 100 MG capsule Take  1 capsule (100 mg total) by mouth 3 (three) times daily. One three times daily (Patient not taking: Reported on 10/21/2017) 90 capsule 2  . glucose blood (TRUE METRIX BLOOD GLUCOSE TEST) test strip Use as instructed TID 100 each 12  . mometasone (NASONEX) 50 MCG/ACT nasal spray Place 2 sprays into the nose daily as needed (allergies).   0  . montelukast (SINGULAIR) 10 MG tablet Take 1 tablet (10 mg total) by mouth at bedtime. (Patient not taking: Reported on 10/21/2017) 30 tablet 1  . pantoprazole (PROTONIX) 40 MG tablet Take 1 tablet (40 mg total) by mouth daily. (Patient not taking: Reported on 10/12/2017) 30 tablet 5  . Polyethyl Glycol-Propyl Glycol (SYSTANE) 0.4-0.3 % SOLN Place 1 drop into both eyes 2 (two) times daily as needed (dry eyes).     . pseudoephedrine-guaifenesin (MUCINEX D) 60-600 MG 12 hr tablet Take 1 tablet by mouth every 12 (twelve) hours as needed for congestion.    . ranitidine (ZANTAC) 300 MG capsule One half after bfast and one half after supper     No facility-administered medications prior to visit.         Objective:   Physical Exam  Vitals:   11/16/17 1139  BP: 120/86  Pulse: 64  SpO2: 97%  Weight: 149 lb (67.6 kg)  Height: 5\' 2"  (1.575 m)   Gen: Pleasant, well-nourished, in no distress,  normal affect  ENT: No lesions,  mouth clear,  oropharynx clear, no postnasal drip but some apparent congestion  Neck: No JVD, no stridor  Lungs: No use of accessory muscles, no wheezing, no crackles  Cardiovascular: RRR, heart sounds normal, no murmur or gallops, no peripheral edema  Musculoskeletal: No deformities, no cyanosis or clubbing  Neuro:  alert, non focal  Skin: Warm, no lesions or rashes                                                                           Assessment & Plan:  Cough Related to rhinitis and also GERD.  We have tried to treat both of these but she has been unreliable therapy.  She saw Dr. Melvyn Novas 1 month ago and he started therapy then but she did not take reliably.  He also started gabapentin to see if she would benefit but she is not on that either.  I believe she needs a bronchoscopy for airway inspection to rule out other causes.  She also needs pulmonary function testing to rule out obstructive lung disease.  If both of these are reassuring then we will have to push hard to improve her compliance with her GERD therapy, rhinitis therapy.  Please continue your chlorpheniramine 8mg  in the morning.  Continue xyzal (levoceterizine) once a day.  You are at low risk from pulmonary standpoint to get your aneurysm procedure done under general anesthesia.  We will set up a bronchoscopy to evaluate your throat and airways. You will need someone to drive you for the procedure as we will use sedation  We will set up full pulmonary function testing  Follow with Dr Lamonte Sakai in 1 month or next available.  Baltazar Apo, MD, PhD 11/16/2017, 12:09 PM [facility] [phone] or if no answer [phone]

## 2017-11-16 NOTE — Assessment & Plan Note (Signed)
Related to rhinitis and also GERD.  We have tried to treat both of these but she has been unreliable therapy.  She saw Dr. Melvyn Novas 1 month ago and he started therapy then but she did not take reliably.  He also started gabapentin to see if she would benefit but she is not on that either.  I believe she needs a bronchoscopy for airway inspection to rule out other causes.  She also needs pulmonary function testing to rule out obstructive lung disease.  If both of these are reassuring then we will have to push hard to improve her compliance with her GERD therapy, rhinitis therapy.  Please continue your chlorpheniramine 8mg  in the morning.  Continue xyzal (levoceterizine) once a day.  You are at low risk from pulmonary standpoint to get your aneurysm procedure done under general anesthesia.  We will set up a bronchoscopy to evaluate your throat and airways. You will need someone to drive you for the procedure as we will use sedation  We will set up full pulmonary function testing  Follow with Dr Lamonte Sakai in 1 month or next available.

## 2017-11-17 NOTE — Telephone Encounter (Signed)
This issue has been handled. Pt is going to cancel the other appointment she has tomorrow and will be coming to have the bronch done. Baxter Flattery is aware of this information. Nothing further was needed.

## 2017-11-17 NOTE — Telephone Encounter (Signed)
Pt is returning call. Cb is [phone].

## 2017-11-17 NOTE — Telephone Encounter (Signed)
Spoke with Yvone Neu was scheduled for tomorrow  4/10 at 10 am but she has an appt. I called to see if pt was still going to the bronch appt.   I tried to call pt but no VM to leave message.   FYI RB

## 2017-11-17 NOTE — Telephone Encounter (Signed)
Attempted to call patient, no answer, and voicemail has not been set up so unable to leave message.   Will route to [place] to also follow up on.

## 2017-11-18 ENCOUNTER — Telehealth: Payer: Self-pay | Admitting: Emergency Medicine

## 2017-11-18 ENCOUNTER — Ambulatory Visit (HOSPITAL_COMMUNITY)
Admission: RE | Admit: 2017-11-18 | Discharge: 2017-11-18 | Disposition: A | Discharge status: Home / Self Care | Payer: BLUE CROSS/BLUE SHIELD | Source: Ambulatory Visit | Attending: Emergency Medicine | Admitting: Emergency Medicine

## 2017-11-18 ENCOUNTER — Encounter (HOSPITAL_COMMUNITY)
Admission: RE | Disposition: A | Discharge status: Home / Self Care | Payer: Self-pay | Source: Ambulatory Visit | Attending: Emergency Medicine

## 2017-11-18 ENCOUNTER — Ambulatory Visit: Payer: BLUE CROSS/BLUE SHIELD | Admitting: Family Medicine

## 2017-11-18 DIAGNOSIS — I5032 Chronic diastolic (congestive) heart failure: Secondary | ICD-10-CM | POA: Diagnosis not present

## 2017-11-18 DIAGNOSIS — J309 Allergic rhinitis, unspecified: Secondary | ICD-10-CM | POA: Insufficient documentation

## 2017-11-18 DIAGNOSIS — Z7982 Long term (current) use of aspirin: Secondary | ICD-10-CM | POA: Diagnosis not present

## 2017-11-18 DIAGNOSIS — Z79899 Other long term (current) drug therapy: Secondary | ICD-10-CM | POA: Diagnosis not present

## 2017-11-18 DIAGNOSIS — I110 Hypertensive heart disease with heart failure: Secondary | ICD-10-CM | POA: Diagnosis not present

## 2017-11-18 DIAGNOSIS — Z7984 Long term (current) use of oral hypoglycemic drugs: Secondary | ICD-10-CM | POA: Insufficient documentation

## 2017-11-18 DIAGNOSIS — E1140 Type 2 diabetes mellitus with diabetic neuropathy, unspecified: Secondary | ICD-10-CM | POA: Diagnosis not present

## 2017-11-18 DIAGNOSIS — R059 Cough, unspecified: Secondary | ICD-10-CM | POA: Diagnosis present

## 2017-11-18 DIAGNOSIS — R05 Cough: Secondary | ICD-10-CM | POA: Diagnosis present

## 2017-11-18 DIAGNOSIS — K219 Gastro-esophageal reflux disease without esophagitis: Secondary | ICD-10-CM | POA: Diagnosis not present

## 2017-11-18 DIAGNOSIS — E785 Hyperlipidemia, unspecified: Secondary | ICD-10-CM | POA: Diagnosis not present

## 2017-11-18 DIAGNOSIS — Z7951 Long term (current) use of inhaled steroids: Secondary | ICD-10-CM | POA: Insufficient documentation

## 2017-11-18 DIAGNOSIS — Z7722 Contact with and (suspected) exposure to environmental tobacco smoke (acute) (chronic): Secondary | ICD-10-CM | POA: Insufficient documentation

## 2017-11-18 DIAGNOSIS — R058 Other specified cough: Secondary | ICD-10-CM

## 2017-11-18 HISTORY — PX: VIDEO BRONCHOSCOPY: SHX5072

## 2017-11-18 LAB — BODY FLUID CELL COUNT WITH DIFFERENTIAL
Lymphs, Fluid: 14 %
Monocyte-Macrophage-Serous Fluid: 82 % (ref 50–90)
Neutrophil Count, Fluid: 4 % (ref 0–25)
Total Nucleated Cell Count, Fluid: 43 cu mm (ref 0–1000)

## 2017-11-18 LAB — GLUCOSE, CAPILLARY: Glucose-Capillary: 116 mg/dL — ABNORMAL HIGH (ref 65–99)

## 2017-11-18 SURGERY — VIDEO BRONCHOSCOPY WITHOUT FLUORO
Anesthesia: Moderate Sedation | Laterality: Bilateral

## 2017-11-18 MED ORDER — MIDAZOLAM HCL 5 MG/ML IJ SOLN
INTRAMUSCULAR | Status: AC
  Filled 2017-11-18: qty 2

## 2017-11-18 MED ORDER — MIDAZOLAM HCL 10 MG/2ML IJ SOLN
INTRAMUSCULAR | Status: DC | PRN
  Administered 2017-11-18: 3 mg via INTRAVENOUS

## 2017-11-18 MED ORDER — PHENYLEPHRINE HCL 0.25 % NA SOLN
NASAL | Status: DC | PRN
  Administered 2017-11-18: 2 via NASAL

## 2017-11-18 MED ORDER — LIDOCAINE HCL (PF) 1 % IJ SOLN
INTRAMUSCULAR | Status: DC | PRN
  Administered 2017-11-18: 6 mL

## 2017-11-18 MED ORDER — LIDOCAINE HCL 2 % EX GEL: 1.0000 "application " | Freq: Once | CUTANEOUS | Status: DC

## 2017-11-18 MED ORDER — HYDRALAZINE HCL 20 MG/ML IJ SOLN
INTRAMUSCULAR | Status: AC
  Filled 2017-11-18: qty 1

## 2017-11-18 MED ORDER — SODIUM CHLORIDE 0.9 % IV SOLN
INTRAVENOUS | Status: DC
  Administered 2017-11-18: 10:00:00 via INTRAVENOUS

## 2017-11-18 MED ORDER — LIDOCAINE HCL 2 % EX GEL
CUTANEOUS | Status: DC | PRN
  Administered 2017-11-18: 1

## 2017-11-18 MED ORDER — HYDRALAZINE HCL 20 MG/ML IJ SOLN
10.0000 mg | Freq: Once | INTRAMUSCULAR | Status: AC
  Administered 2017-11-18: 10 mg via INTRAVENOUS

## 2017-11-18 MED ORDER — FENTANYL CITRATE (PF) 100 MCG/2ML IJ SOLN
INTRAMUSCULAR | Status: AC
  Filled 2017-11-18: qty 4

## 2017-11-18 MED ORDER — PHENYLEPHRINE HCL 0.25 % NA SOLN: 1.0000 | Freq: Four times a day (QID) | NASAL | Status: DC | PRN

## 2017-11-18 MED ORDER — FENTANYL CITRATE (PF) 100 MCG/2ML IJ SOLN
INTRAMUSCULAR | Status: DC | PRN
  Administered 2017-11-18: 50 ug via INTRAVENOUS

## 2017-11-18 NOTE — Discharge Instructions (Signed)
Flexible Bronchoscopy, Care After These instructions give you information on caring for yourself after your procedure. Your doctor may also give you more specific instructions. Call your doctor if you have any problems or questions after your procedure. Follow these instructions at home:  Do not eat or drink anything for 2 hours after your procedure. If you try to eat or drink before the medicine wears off, food or drink could go into your lungs. You could also burn yourself.  After 2 hours have passed and when you can cough and gag normally, you may eat soft food and drink liquids slowly.  The day after the test, you may eat your normal diet.  You may do your normal activities.  Keep all doctor visits. Get help right away if:  You get more and more short of breath.  You get light-headed.  You feel like you are going to pass out (faint).  You have chest pain.  You have new problems that worry you.  You cough up more than a little blood.  You cough up more blood than before. This information is not intended to replace advice given to you by your health care provider. Make sure you discuss any questions you have with your health care provider. Document Released: 05/25/2009 Document Revised: 01/03/2016 Document Reviewed: 04/01/2013 Elsevier Interactive Patient Education  2017 [place] not eat or drink until after 1215 today 11/18/17  PLEASE CALL OUR OFFICE FOR ANY PROBLEMS OR QUESTIONS [phone].

## 2017-11-18 NOTE — Op Note (Signed)
[facility] Cardiopulmonary Patient Name: Paula [name] Procedure Date: 11/18/2017 MRN: [id number] Attending MD: Collene Gobble , MD Date of Birth: 03/22/1946 CSN: [id number] Age: 72 Admit Type: Outpatient Ethnicity: Not Hispanic or Latino Procedure:            Bronchoscopy Indications:          Chronic cough with normal chest X-ray Providers:            Collene Gobble, MD, Cherre Huger RRT, RCP, Ashley Mariner                        RRT,RCP Referring MD:          Medicines:            Midazolam 3 mg mg IV, Fentanyl 50 mcg IV, Lidocaine 1%                        applied to cords 8 mL, Lidocaine 1% applied to the                        tracheobronchial tree 8 mL Complications:        No immediate complications Estimated Blood Loss: Estimated blood loss: none. Procedure:      Pre-Anesthesia Assessment:      - A History and Physical has been performed. Patient meds and allergies       have been reviewed. The risks and benefits of the procedure and the       sedation options and risks were discussed with the patient. All       questions were answered and informed consent was obtained. Patient       identification and proposed procedure were verified prior to the       procedure by the physician in the procedure room. Mental Status       Examination: alert and oriented. Airway Examination: normal       oropharyngeal airway. Respiratory Examination: clear to auscultation. CV       Examination: normal. ASA Grade Assessment: I - A normal healthy patient.       After reviewing the risks and benefits, the patient was deemed in       satisfactory condition to undergo the procedure. The anesthesia plan was       to use moderate sedation / analgesia (conscious sedation). Immediately       prior to administration of medications, the patient was re-assessed for       adequacy to receive sedatives. The heart rate, respiratory rate, oxygen       saturations, blood pressure,  adequacy of pulmonary ventilation, and       response to care were monitored throughout the procedure. The physical       status of the patient was re-assessed after the procedure.      After obtaining informed consent, the bronchoscope was passed under       direct vision. Throughout the procedure, the patient's blood pressure,       pulse, and oxygen saturations were monitored continuously. the [id number]       ([id number]) scope was introduced through the right nostril and advanced to       the tracheobronchial tree. The procedure was accomplished without       difficulty. The patient tolerated the procedure well. The total duration       of the procedure was  23 minutes. Findings:      The nasopharynx/oropharynx appears normal. The larynx is very crowded       with edema, no significant cobblestoning. No overt lesions noted. The       vocal cords appear normal at rest and with phonation. The subglottic       space is normal. The trachea is of normal caliber. The main carina is       sharp. The tracheobronchial tree was examined to at least the first       subsegmental level. Bronchial mucosa is somewhat edematous with some       blunting of the carinae and some collapsability of the segmantal       airways. Anatomy is otherwise normal; there are no endobronchial       lesions, and no secretions.      Bronchoalveolar lavage was performed in the RUL anterior segment (B3) of       the lung and sent for cell count, bacterial culture, viral smears &       culture, and fungal & AFB analysis and cytology. 60 mL of fluid were       instilled. 20 mL were returned. The return was clear. There were no       mucoid plugs in the return fluid. Impression:      - Chronic cough with normal chest X-ray      - The airway examination was normal.      - Bronchoalveolar lavage was performed. Moderate Sedation:      Moderate (conscious) sedation was personally administered by the       pulmonologist. The  following parameters were monitored: oxygen       saturation, heart rate, blood pressure, respiratory rate, EKG, adequacy       of pulmonary ventilation, and response to care. Total physician       intraservice time was 23 minutes. Recommendation:      - Await BAL, culture and cytology results.      - Discharge patient to home (ambulatory). Procedure Code(s):      --- Professional ---      [phone], Bronchoscopy, rigid or flexible, including fluoroscopic guidance,       when performed; with bronchial alveolar lavage      99152, Moderate sedation services provided by the same physician or       other qualified health care professional performing the diagnostic or       therapeutic service that the sedation supports, requiring the presence       of an independent trained observer to assist in the monitoring of the       patient's level of consciousness and physiological status; initial 15       minutes of intraservice time, patient age 76 years or older      [phone], Moderate sedation services provided by the same physician or       other qualified health care professional performing the diagnostic or       therapeutic service that the sedation supports, requiring the presence       of an independent trained observer to assist in the monitoring of the       patient's level of consciousness and physiological status; each       additional 15 minutes intraservice time (List separately in addition to       code for primary service) Diagnosis Code(s):      --- Professional ---  R05, Cough CPT copyright 2017 American Medical Association. All rights reserved. The codes documented in this report are preliminary and upon coder review may  be revised to meet current compliance requirements. Collene Gobble, MD Collene Gobble, MD 11/18/2017 10:52:50 AM Number of Addenda: 0 Scope In: 10:26:16 AM Scope Out: 10:34:21 AM

## 2017-11-18 NOTE — Progress Notes (Signed)
Video bronchoscopy performed Intervention bronchial washings Pt tolerated well  Adhya Cocco David RRT  

## 2017-11-18 NOTE — Interval H&P Note (Signed)
PCCM Interval Note  Pt presents today for further eval of her chronic cough. Etiology unclear but suspect related to GERD and rhinitis based on her past eval and response to therapy.   No new problems reported, although she is hypertensive on presentation here, did not take her verapamil, carvedilol, Imdur this am since she was NPO. Also having difficulty obtaining IV access, still attempting  Vitals:   11/18/17 0945 11/18/17 0949  BP: (!) 204/87 (!) 204/142  Resp: 20 (!) 21  Temp:  99.1 F (37.3 C)  TempSrc:  Oral  SpO2: 100% 100%   Planning for inspection FOB to further w/u cough, r/o endobronchial or pharyngeal lesion. If we are able to get IV access then we will give IV BP control before giving conscious sedation. If we are unable to get IV access then we I will refer her to ENT where she can at least get an airway inspection.   Baltazar Apo, MD, PhD 11/18/2017, 10:08 AM [facility] [phone] or if no answer [phone]

## 2017-11-18 NOTE — Telephone Encounter (Signed)
Called and spoke to patient and let her know she could resume her medications at the next scheduled time that her medications are due. I gave the example that if a medication is usually taken in the morning and evening then take the evening dose at the regular time. Patient stated that was good because she had already taken her medications. Nothing further is needed at this time.

## 2017-11-19 ENCOUNTER — Encounter (HOSPITAL_COMMUNITY): Payer: Self-pay | Admitting: Emergency Medicine

## 2017-11-19 LAB — ACID FAST SMEAR (AFB, MYCOBACTERIA): Acid Fast Smear: NEGATIVE

## 2017-11-20 LAB — CULTURE, BAL-QUANTITATIVE W GRAM STAIN
Culture: 50000 — AB
Gram Stain: NONE SEEN
Special Requests: NORMAL

## 2017-11-25 ENCOUNTER — Other Ambulatory Visit (HOSPITAL_COMMUNITY): Payer: Self-pay | Admitting: Interventional Radiology

## 2017-11-25 DIAGNOSIS — I671 Cerebral aneurysm, nonruptured: Secondary | ICD-10-CM

## 2017-12-02 ENCOUNTER — Ambulatory Visit: Payer: BLUE CROSS/BLUE SHIELD | Admitting: Allergy

## 2017-12-08 ENCOUNTER — Other Ambulatory Visit: Payer: Self-pay | Admitting: Radiology

## 2017-12-08 NOTE — Pre-Procedure Instructions (Addendum)
[name]  12/08/2017    Your procedure is scheduled on Monday, Dec 14, 2017 at 8:30 AM.   Report to [facility] Entrance "A" Admitting Office at 6:30 AM.   Call this number if you have problems the morning of surgery: [phone]   Questions prior to day of surgery, please call [phone] between 8 & 4 PM.   Remember:  Do not eat food or drink liquids after midnight Sunday, 12/13/17  Take these medicines the morning of surgery with A SIP OF WATER: Aspirin, Clopidogrel (Plavix), Carvedilol (Coreg), Cetirizine (Zyrtec), Gabapentin (Neurontin), Isosorbide Mononitrate (Imdur), Verapamil (Calan), Albuterol inhaler - if needed (bring with you day of surgery), eye drops   Do not take Glipizide the evening before surgery or the day of surgery.   How to Manage Your Diabetes Before Surgery   Why is it important to control my blood sugar before and after surgery?   Improving blood sugar levels before and after surgery helps healing and can limit problems.  A way of improving blood sugar control is eating a healthy diet by:  - Eating less sugar and carbohydrates  - Increasing activity/exercise  - Talk with your doctor about reaching your blood sugar goals  High blood sugars (greater than 180 mg/dL) can raise your risk of infections and slow down your recovery so you will need to focus on controlling your diabetes during the weeks before surgery.  Make sure that the doctor who takes care of your diabetes knows about your planned surgery including the date and location.  How do I manage my blood sugars before surgery?   Check your blood sugar at least 4 times a day, 2 days before surgery to make sure that they are not too high or low.  Check your blood sugar the morning of your surgery when you wake up and every 2 hours until you get to the Short-Stay unit.  Treat a low blood sugar (less than 70 mg/dL) with 1/2 cup of clear juice (cranberry or apple), 4 glucose  tablets, OR glucose gel.  Recheck blood sugar in 15 minutes after treatment (to make sure it is greater than 70 mg/dL).  If blood sugar is not greater than 70 mg/dL on re-check, call [phone] for further instructions.   Report your blood sugar to the Short-Stay nurse when you get to Short-Stay.  References:  [facility], 2007 "How to Manage your Diabetes Before and After Surgery".   Do not wear jewelry, make-up or nail polish.  Do not wear lotions, powders, perfumes or deodorant.  Do not shave 48 hours prior to surgery.    Do not bring valuables to the hospital.  [facility] is not responsible for any belongings or valuables.  Contacts, dentures or bridgework may not be worn into surgery.  Leave your suitcase in the car.  After surgery it may be brought to your room.  For patients admitted to the hospital, discharge time will be determined by your treatment team.  [facility] - Preparing for Surgery  Before surgery, you can play an important role.  Because skin is not sterile, your skin needs to be as free of germs as possible.  You can reduce the number of germs on you skin by washing with CHG (chlorahexidine gluconate) soap before surgery.  CHG is an antiseptic cleaner which kills germs and bonds with the skin to continue killing germs even after washing.  Please DO NOT use if you have an  allergy to CHG or antibacterial soaps.  If your skin becomes reddened/irritated stop using the CHG and inform your nurse when you arrive at Short Stay.  Do not shave (including legs and underarms) for at least 48 hours prior to the first CHG shower.  You may shave your face.  Please follow these instructions carefully:   1.  Shower with CHG Soap the night before surgery and the                    morning of Surgery.  2.  If you choose to wash your hair, wash your hair first as usual with your       normal shampoo.  3.  After you shampoo, rinse your hair and body  thoroughly to remove the shampoo.  4.  Use CHG as you would any other liquid soap.  You can apply chg directly       to the skin and wash gently with scrungie or a clean washcloth.  5.  Apply the CHG Soap to your body ONLY FROM THE NECK DOWN.        Do not use on open wounds or open sores.  Avoid contact with your eyes, ears, mouth and genitals (private parts).  Wash genitals (private parts) with your normal soap.  6.  Wash thoroughly, paying special attention to the area where your surgery        will be performed.  7.  Thoroughly rinse your body with warm water from the neck down.  8.  DO NOT shower/wash with your normal soap after using and rinsing off       the CHG Soap.  9.  Pat yourself dry with a clean towel.            10.  Wear clean pajamas.            11.  Place clean sheets on your bed the night of your first shower and do not        sleep with pets.  Day of Surgery  Shower as above. Do not apply any lotions/deodorants the morning of surgery.  Please wear clean clothes to the hospital.    Please read over the fact sheets that you were given.

## 2017-12-09 ENCOUNTER — Encounter (HOSPITAL_COMMUNITY)
Admission: RE | Admit: 2017-12-09 | Discharge: 2017-12-09 | Disposition: A | Discharge status: Home / Self Care | Payer: BLUE CROSS/BLUE SHIELD | Source: Ambulatory Visit | Attending: Interventional Radiology | Admitting: Interventional Radiology

## 2017-12-09 ENCOUNTER — Other Ambulatory Visit (HOSPITAL_COMMUNITY): Payer: Self-pay | Admitting: *Deleted

## 2017-12-09 ENCOUNTER — Encounter (HOSPITAL_COMMUNITY): Payer: Self-pay

## 2017-12-09 ENCOUNTER — Other Ambulatory Visit: Payer: Self-pay

## 2017-12-09 DIAGNOSIS — Z01812 Encounter for preprocedural laboratory examination: Secondary | ICD-10-CM | POA: Diagnosis present

## 2017-12-09 HISTORY — DX: Unspecified asthma, uncomplicated: J45.909

## 2017-12-09 HISTORY — DX: Chronic kidney disease, unspecified: N18.9

## 2017-12-09 LAB — BASIC METABOLIC PANEL WITH GFR
Anion gap: 13 (ref 5–15)
BUN: 18 mg/dL (ref 6–20)
CO2: 29 mmol/L (ref 22–32)
Calcium: 9.6 mg/dL (ref 8.9–10.3)
Chloride: 99 mmol/L — ABNORMAL LOW (ref 101–111)
Creatinine, Ser: 1.58 mg/dL — ABNORMAL HIGH (ref 0.44–1.00)
GFR calc Af Amer: 37 mL/min — ABNORMAL LOW
GFR calc non Af Amer: 32 mL/min — ABNORMAL LOW
Glucose, Bld: 144 mg/dL — ABNORMAL HIGH (ref 65–99)
Potassium: 3.9 mmol/L (ref 3.5–5.1)
Sodium: 141 mmol/L (ref 135–145)

## 2017-12-09 LAB — CBC WITH DIFFERENTIAL/PLATELET
Basophils Absolute: 0 K/uL (ref 0.0–0.1)
Basophils Relative: 0 %
Eosinophils Absolute: 0.2 K/uL (ref 0.0–0.7)
Eosinophils Relative: 4 %
HCT: 36.4 % (ref 36.0–46.0)
Hemoglobin: 12.7 g/dL (ref 12.0–15.0)
Lymphocytes Relative: 34 %
Lymphs Abs: 1.6 K/uL (ref 0.7–4.0)
MCH: 28.7 pg (ref 26.0–34.0)
MCHC: 34.9 g/dL (ref 30.0–36.0)
MCV: 82.4 fL (ref 78.0–100.0)
Monocytes Absolute: 0.4 K/uL (ref 0.1–1.0)
Monocytes Relative: 9 %
Neutro Abs: 2.4 K/uL (ref 1.7–7.7)
Neutrophils Relative %: 51 %
Platelets: 181 K/uL (ref 150–400)
RBC: 4.42 MIL/uL (ref 3.87–5.11)
RDW: 12.8 % (ref 11.5–15.5)
WBC: 4.6 K/uL (ref 4.0–10.5)

## 2017-12-09 LAB — PLATELET INHIBITION P2Y12: Platelet Function  P2Y12: 166 [PRU] — ABNORMAL LOW (ref 194–418)

## 2017-12-09 LAB — PROTIME-INR
INR: 1.1
Prothrombin Time: 14.1 s (ref 11.4–15.2)

## 2017-12-09 LAB — HEMOGLOBIN A1C
Hgb A1c MFr Bld: 6.6 % — ABNORMAL HIGH (ref 4.8–5.6)
Mean Plasma Glucose: 142.72 mg/dL

## 2017-12-09 LAB — GLUCOSE, CAPILLARY: Glucose-Capillary: 161 mg/dL — ABNORMAL HIGH (ref 65–99)

## 2017-12-09 LAB — APTT: aPTT: 26 seconds (ref 24–36)

## 2017-12-09 NOTE — Progress Notes (Signed)
Pt states she has had an irregular heart rate in the past, but no other cardiac problems that she is aware of. States she saw a cardiologist at [facility] in 2017 and wore a heart monitor. States it was negative and she didn't have to return. Hx of HTN and Type 2 diabetic. She states it's been "a long time" since having an A1C done. She thinks it was 8. Something. Pt states her fasting blood sugar is usually between 80-120. Today's CBG was 161, she had eaten breakfast. Pt states she started Plavix on 12/07/17. Pt also on an 81 mg Aspirin

## 2017-12-10 ENCOUNTER — Telehealth: Payer: Self-pay | Admitting: Podiatry

## 2017-12-10 NOTE — Telephone Encounter (Signed)
Pt called and left 3 messages about diabetic shoes. She has bcbs and wants to know if they are covered.   I called pt back and per her insurance diabetic shoes are not covered. I did tell pt that if we ordered her a pair of shoes and 1 pair of inserts it is 250.00. She is deciding and will contact us if she wants to proceed.

## 2017-12-14 ENCOUNTER — Ambulatory Visit (HOSPITAL_COMMUNITY): Payer: BLUE CROSS/BLUE SHIELD | Admitting: Certified Registered"

## 2017-12-14 ENCOUNTER — Encounter (HOSPITAL_COMMUNITY)
Admission: RE | Disposition: A | Discharge status: Home / Self Care | Payer: Self-pay | Source: Ambulatory Visit | Attending: Interventional Radiology

## 2017-12-14 ENCOUNTER — Other Ambulatory Visit: Payer: Self-pay

## 2017-12-14 ENCOUNTER — Ambulatory Visit (HOSPITAL_COMMUNITY)
Admission: RE | Admit: 2017-12-14 | Discharge: 2017-12-14 | Disposition: A | Discharge status: Home / Self Care | Payer: BLUE CROSS/BLUE SHIELD | Source: Ambulatory Visit | Attending: Interventional Radiology | Admitting: Interventional Radiology

## 2017-12-14 ENCOUNTER — Encounter (HOSPITAL_COMMUNITY): Payer: Self-pay

## 2017-12-14 DIAGNOSIS — I130 Hypertensive heart and chronic kidney disease with heart failure and stage 1 through stage 4 chronic kidney disease, or unspecified chronic kidney disease: Secondary | ICD-10-CM | POA: Diagnosis not present

## 2017-12-14 DIAGNOSIS — K219 Gastro-esophageal reflux disease without esophagitis: Secondary | ICD-10-CM | POA: Insufficient documentation

## 2017-12-14 DIAGNOSIS — N183 Chronic kidney disease, stage 3 (moderate): Secondary | ICD-10-CM | POA: Insufficient documentation

## 2017-12-14 DIAGNOSIS — Z7982 Long term (current) use of aspirin: Secondary | ICD-10-CM | POA: Insufficient documentation

## 2017-12-14 DIAGNOSIS — Z7902 Long term (current) use of antithrombotics/antiplatelets: Secondary | ICD-10-CM | POA: Insufficient documentation

## 2017-12-14 DIAGNOSIS — J449 Chronic obstructive pulmonary disease, unspecified: Secondary | ICD-10-CM | POA: Insufficient documentation

## 2017-12-14 DIAGNOSIS — E1151 Type 2 diabetes mellitus with diabetic peripheral angiopathy without gangrene: Secondary | ICD-10-CM | POA: Insufficient documentation

## 2017-12-14 DIAGNOSIS — E1140 Type 2 diabetes mellitus with diabetic neuropathy, unspecified: Secondary | ICD-10-CM | POA: Insufficient documentation

## 2017-12-14 DIAGNOSIS — E1122 Type 2 diabetes mellitus with diabetic chronic kidney disease: Secondary | ICD-10-CM | POA: Diagnosis not present

## 2017-12-14 DIAGNOSIS — I671 Cerebral aneurysm, nonruptured: Secondary | ICD-10-CM | POA: Insufficient documentation

## 2017-12-14 DIAGNOSIS — E785 Hyperlipidemia, unspecified: Secondary | ICD-10-CM | POA: Insufficient documentation

## 2017-12-14 DIAGNOSIS — I5032 Chronic diastolic (congestive) heart failure: Secondary | ICD-10-CM | POA: Diagnosis not present

## 2017-12-14 HISTORY — PX: IR 3D INDEPENDENT WKST: IMG2385

## 2017-12-14 HISTORY — PX: RADIOLOGY WITH ANESTHESIA: SHX6223

## 2017-12-14 HISTORY — PX: IR ANGIO INTRA EXTRACRAN SEL COM CAROTID INNOMINATE UNI L MOD SED: IMG5358

## 2017-12-14 HISTORY — DX: Cerebral aneurysm, nonruptured: I67.1

## 2017-12-14 LAB — PLATELET INHIBITION P2Y12: Platelet Function  P2Y12: 93 [PRU] — ABNORMAL LOW (ref 194–418)

## 2017-12-14 LAB — GLUCOSE, CAPILLARY: Glucose-Capillary: 134 mg/dL — ABNORMAL HIGH (ref 65–99)

## 2017-12-14 SURGERY — RADIOLOGY WITH ANESTHESIA
Anesthesia: General

## 2017-12-14 MED ORDER — ASPIRIN EC 325 MG PO TBEC
DELAYED_RELEASE_TABLET | ORAL | Status: AC
  Filled 2017-12-14: qty 1

## 2017-12-14 MED ORDER — LIDOCAINE HCL 1 % IJ SOLN
INTRAMUSCULAR | Status: AC
  Filled 2017-12-14: qty 20

## 2017-12-14 MED ORDER — ASPIRIN EC 325 MG PO TBEC: 325.0000 mg | DELAYED_RELEASE_TABLET | ORAL | Status: DC

## 2017-12-14 MED ORDER — MIDAZOLAM HCL 2 MG/2ML IJ SOLN
INTRAMUSCULAR | Status: DC | PRN
  Administered 2017-12-14: 1 mg via INTRAVENOUS

## 2017-12-14 MED ORDER — SODIUM CHLORIDE 0.9 % IV SOLN: INTRAVENOUS | Status: DC

## 2017-12-14 MED ORDER — CLOPIDOGREL BISULFATE 75 MG PO TABS
ORAL_TABLET | ORAL | Status: AC
  Filled 2017-12-14: qty 1

## 2017-12-14 MED ORDER — CEFAZOLIN SODIUM-DEXTROSE 2-4 GM/100ML-% IV SOLN: 2.0000 g | INTRAVENOUS | Status: DC

## 2017-12-14 MED ORDER — NIMODIPINE 30 MG PO CAPS
ORAL_CAPSULE | ORAL | Status: AC
  Administered 2017-12-14: 60 mg via ORAL
  Filled 2017-12-14: qty 2

## 2017-12-14 MED ORDER — FENTANYL CITRATE (PF) 100 MCG/2ML IJ SOLN
INTRAMUSCULAR | Status: DC | PRN
  Administered 2017-12-14: 25 ug via INTRAVENOUS

## 2017-12-14 MED ORDER — CEFAZOLIN SODIUM-DEXTROSE 2-4 GM/100ML-% IV SOLN
INTRAVENOUS | Status: AC
  Filled 2017-12-14: qty 100

## 2017-12-14 MED ORDER — NIMODIPINE 30 MG PO CAPS
0.0000 mg | ORAL_CAPSULE | ORAL | Status: AC
  Administered 2017-12-14: 60 mg via ORAL

## 2017-12-14 MED ORDER — CISATRACURIUM BESYLATE 20 MG/10ML IV SOLN
INTRAVENOUS | Status: AC
  Filled 2017-12-14: qty 20

## 2017-12-14 MED ORDER — IOPAMIDOL (ISOVUE-300) INJECTION 61%
INTRAVENOUS | Status: AC
  Administered 2017-12-14: 40 mL
  Filled 2017-12-14: qty 150

## 2017-12-14 MED ORDER — LACTATED RINGERS IV SOLN
INTRAVENOUS | Status: DC
  Administered 2017-12-14: 08:00:00 via INTRAVENOUS

## 2017-12-14 MED ORDER — HEPARIN SODIUM (PORCINE) 1000 UNIT/ML IJ SOLN
INTRAMUSCULAR | Status: DC | PRN
  Administered 2017-12-14: 1000 [IU] via INTRAVENOUS

## 2017-12-14 MED ORDER — CLOPIDOGREL BISULFATE 75 MG PO TABS: 75.0000 mg | ORAL_TABLET | ORAL | Status: DC

## 2017-12-14 MED ORDER — IOPAMIDOL (ISOVUE-300) INJECTION 61%
INTRAVENOUS | Status: AC
  Administered 2017-12-14: 21 mL
  Filled 2017-12-14: qty 100

## 2017-12-14 NOTE — Sedation Documentation (Signed)
R groin pulses checked with Arby Barrette RN

## 2017-12-14 NOTE — Progress Notes (Signed)
Pt ambulated, R groin remains level 0, vpad removed, gauze/tegaderm dsg applied, pt dressing for D/C.

## 2017-12-14 NOTE — Sedation Documentation (Signed)
R groin sheath removed Vpad applied

## 2017-12-14 NOTE — Anesthesia Procedure Notes (Signed)
Arterial Line Insertion Start/End5/01/2018 8:15 AM, 12/14/2017 8:25 AM Performed by: Cleda Daub, CRNA, CRNA  Patient location: Pre-op. Preanesthetic checklist: patient identified, IV checked, site marked, risks and benefits discussed, surgical consent, monitors and equipment checked, pre-op evaluation and anesthesia consent Lidocaine 1% used for infiltration Left, radial was placed Catheter size: 20 G Hand hygiene performed , maximum sterile barriers used  and Seldinger technique used Allen's test indicative of satisfactory collateral circulation Attempts: 1 Procedure performed without using ultrasound guided technique. Ultrasound Notes:anatomy identified, needle tip was noted to be adjacent to the nerve/plexus identified and no ultrasound evidence of intravascular and/or intraneural injection Following insertion, dressing applied and Biopatch. Post procedure assessment: normal  Patient tolerated the procedure well with no immediate complications.

## 2017-12-14 NOTE — Procedures (Signed)
S/P Lt common carotid arteriogram. RT CFA approach. Findings. 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

## 2017-12-14 NOTE — H&P (Signed)
Chief Complaint: Patient was seen in consultation today for Left internal carotid artery aneurysm embolization  At request of Dr Dillard Cannon  Supervising Physician: Luanne Bras  Patient Status: [facility] - Out-pt  History of Present Illness: Paula [name] is a 72 y.o. female    Presented to ED 09/01/17 Altered mental status Headaches; slurred speech Work up included CT and MRI/MRA 09/02/17: IMPRESSION: 1. No emergent large vessel occlusion. 2. Moderate stenosis RIGHT carotid siphon associated with calcific atherosclerosis on prior CT. 3. Mild cerebral artery atherosclerosis. Moderate stenosis LEFT M2 segment. 4. 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  All mental status changes have resolved Referred to Dr Estanislado Pandy as OP  1/31/Note Dr Estanislado Pandy: The MRI MRA findings were reviewed with the patient and also with her daughter. Brought to their attention was the focal outpouching in the left internal carotid artery supraclinoid region. A formal catheter arteriogram would be needed in order to more accurately identify the morphology of the suspected aneurysm in the left internal carotid artery supraclinoid region.  2/5 Arteriogram: IMPRESSION: Approximately 5.2 mm x 3.4 mm wide-based mildly lobulated left internal carotid artery superior hypophyseal region aneurysm. 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 Approximately 50% stenosis of the left internal carotid artery petrous cavernous junction. 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 Approximately 80% stenosis at the origin of the  right external carotid artery with the post stenotic dilatation.  Note from Dr Lamonte Sakai 11/16/17: You are at low risk from pulmonary standpoint to get your aneurysm procedure done under general anesthesia.   Scheduled now for cerebral arteriogram with L ICA aneurysm embolization   Past Medical History:  Diagnosis Date  . Allergic rhinitis   . Arthritis   . Asthma   . Brain aneurysm   . Chronic kidney disease    Stage 3  . Cough   . Diabetes mellitus   . Diastolic CHF, chronic ([place]) 10/11/2011  . GERD (gastroesophageal reflux disease)   . History of colon polyps 2012   tubular adenoma   . Hyperlipidemia   . Hypertension   . Neuropathy 10/11/2011  . PONV (postoperative nausea and vomiting)    one time after lymph node surgery    Past Surgical History:  Procedure Laterality Date  . CATARACT EXTRACTION     right eye  . IR ANGIO INTRA EXTRACRAN SEL COM CAROTID INNOMINATE BILAT MOD SED  09/15/2017  . IR ANGIO VERTEBRAL SEL VERTEBRAL BILAT MOD SED  09/15/2017  . IR RADIOLOGIST EVAL & MGMT  09/10/2017  . IR RADIOLOGIST EVAL & MGMT  10/19/2017  . lymphatic mass surgery    . lymphoma mass surgery Bilateral    non cancerous  . NASAL TURBINATE REDUCTION    . VIDEO BRONCHOSCOPY Bilateral 11/18/2017   Procedure: VIDEO BRONCHOSCOPY WITHOUT FLUORO;  Surgeon: Collene Gobble, MD;  Location: [facility];  Service: Cardiopulmonary;  Laterality: Bilateral;    Allergies: Dust mite extract; Ace inhibitors; and Lovastatin  Medications: Prior to Admission medications   Medication Sig Start Date End Date Taking? Authorizing Provider  acetaminophen (TYLENOL) 500 MG tablet Take 500 mg by mouth every 6 (six) hours as needed for headache.   Yes [provider]  albuterol (PROVENTIL HFA;VENTOLIN HFA) 108 (90 Base) MCG/ACT inhaler INHALE TWO PUFFS INTO LUNGS EVERY 6 HOURS AS NEEDED FOR SHORTNESS OF BREATH 10/09/17  Yes Charlott Rakes, MD  aspirin 81 MG tablet Take 81 mg by mouth daily.     Yes  [provider]  calcium carbonate (OSCAL) 1500 (600 Ca) MG TABS tablet Take 1,500 mg by mouth daily with breakfast.   Yes [provider]  carvedilol (COREG) 3.125 MG tablet Take 1 tablet (3.125 mg total) by mouth 2 (two) times daily with a meal. 10/21/17  Yes Newlin, Enobong, MD  cetirizine (ZYRTEC) 10 MG tablet Take 1 tablet (10 mg total) by mouth daily. 08/20/17  Yes Charlott Rakes, MD  clopidogrel (PLAVIX) 75 MG tablet Take 75 mg by mouth daily. 12/02/17  Yes [provider]  gabapentin (NEURONTIN) 100 MG capsule Take 1 capsule (100 mg total) by mouth 3 (three) times daily. One three times daily 10/12/17  Yes Tanda Rockers, MD  glipiZIDE (GLUCOTROL) 10 MG tablet Take 1 tablet (10 mg total) by mouth 2 (two) times daily before a meal. 08/20/17  Yes Newlin, Enobong, MD  glucose blood (TRUE METRIX BLOOD GLUCOSE TEST) test strip Use as instructed TID 10/09/17  Yes Charlott Rakes, MD  isosorbide mononitrate (IMDUR) 60 MG 24 hr tablet Take 1 tablet (60 mg total) by mouth daily. 08/20/17  Yes Charlott Rakes, MD  levocetirizine (XYZAL) 5 MG tablet Take 1 tablet (5 mg total) by mouth every evening. 10/01/17  Yes Ambs, Kathrine Cords, FNP  Polyethyl Glycol-Propyl Glycol (SYSTANE) 0.4-0.3 % SOLN Place 1 drop into both eyes 2 (two) times daily as needed (dry eyes).    Yes [provider]  rosuvastatin (CRESTOR) 5 MG tablet Take 1 tablet (5 mg total) by mouth daily. 08/20/17  Yes Charlott Rakes, MD  verapamil (CALAN) 80 MG tablet Take 1 tablet (80 mg total) by mouth 2 (two) times daily. 08/20/17  Yes Charlott Rakes, MD  Camphor-Eucalyptus-Menthol (CHEST RUB EX) Apply 1 application topically daily as needed (cough).    [provider]  montelukast (SINGULAIR) 10 MG tablet Take 1 tablet (10 mg total) by mouth at bedtime. Patient not taking: Reported on 10/21/2017 09/24/17   Charlott Rakes, MD  pantoprazole (PROTONIX) 40 MG tablet Take 1 tablet (40 mg total) by mouth  daily. Patient not taking: Reported on 10/12/2017 07/14/17   Collene Gobble, MD     Family History  Problem Relation Age of Onset  . Hypertension Mother   . Asthma Sister   . Colon cancer Neg Hx   . Allergic rhinitis Neg Hx   . Eczema Neg Hx     Social History   Socioeconomic History  . Marital status: Widowed    Spouse name: Not on file  . Number of children: 4  . Years of education: Not on file  . Highest education level: Not on file  Occupational History  . Occupation: Unemployed  Social Needs  . Financial resource strain: Not on file  . Food insecurity:    Worry: Not on file    Inability: Not on file  . Transportation needs:    Medical: Not on file    Non-medical: Not on file  Tobacco Use  . Smoking status: Never Smoker  . Smokeless tobacco: Never Used  Substance and Sexual Activity  . Alcohol use: No  . Drug use: No  . Sexual activity: Not Currently    Birth control/protection: None  Lifestyle  .  Physical activity:    Days per week: Not on file    Minutes per session: Not on file  . Stress: Not on file  Relationships  . Social connections:    Talks on phone: Not on file    Gets together: Not on file    Attends religious service: Not on file    Active member of club or organization: Not on file    Attends meetings of clubs or organizations: Not on file    Relationship status: Not on file  Other Topics Concern  . Not on file  Social History Narrative   Trecia lives in a 72 year old apartment with no concern for water damage or mildew.  Flooring is carpet throughout.  Heating is electric and cooling is central.  There are no animals located in the home, however, there is 1 dog across the hallway.  There are no roaches in the home and there are no dust mite free covers on the bed or pillow.  She is exposed to tobacco smoke in her home.  There are no concerns for exposure to fumes, chemicals, or dust in the home.    Review of Systems: A 12 point ROS discussed  and pertinent positives are indicated in the HPI above.  All other systems are negative.  Review of Systems  Constitutional: Negative for activity change, appetite change, fatigue, fever and unexpected weight change.  HENT: Negative for tinnitus, trouble swallowing and voice change.   Eyes: Negative for visual disturbance.  Respiratory: Negative for cough and shortness of breath.   Cardiovascular: Negative for chest pain.  Gastrointestinal: Negative for abdominal pain.  Musculoskeletal: Negative for back pain, gait problem, neck pain and neck stiffness.  Neurological: Positive for headaches. Negative for dizziness, tremors, seizures, syncope, facial asymmetry, speech difficulty, weakness, light-headedness and numbness.  Psychiatric/Behavioral: Negative for behavioral problems and confusion.    Vital Signs: BP (!) 209/94   Pulse 67   Temp 97.6 F (36.4 C)   Resp 20   Ht 5\' 2"  (1.575 m)   Wt 149 lb (67.6 kg)   LMP  (LMP Unknown)   SpO2 99%   BMI 27.25 kg/m   Physical Exam  Constitutional: She is oriented to person, place, and time. She appears well-nourished.  HENT:  Head: Atraumatic.  Eyes: EOM are normal.  Neck: Neck supple.  Cardiovascular: Normal rate, regular rhythm and normal heart sounds.  Pulmonary/Chest: Effort normal and breath sounds normal. She has no wheezes.  Abdominal: Soft. Bowel sounds are normal. There is no tenderness.  Musculoskeletal: Normal range of motion.  Neurological: She is oriented to person, place, and time.  Skin: Skin is warm and dry.  Psychiatric: She has a normal mood and affect. Her behavior is normal. Judgment and thought content normal.  Nursing note and vitals reviewed.   Imaging: No results found.  Labs:  CBC: Recent Labs    09/02/17 0144 09/15/17 0846 10/12/17 1559 12/09/17 1035  WBC 9.4 5.1 5.6 4.6  HGB 12.7 12.4 12.6 12.7  HCT 35.5* 35.8* 36.7 36.4  PLT 215 208 188.0 181    COAGS: Recent Labs    09/01/17 1551  09/15/17 0846 12/09/17 1035  INR 1.01 1.08 1.10  APTT 27 26 26     BMP: Recent Labs    09/03/17 0512 09/15/17 0846 09/24/17 1214 12/09/17 1035  NA 140 141 137 141  K 3.6 3.2* 4.2 3.9  CL 109 102 95* 99*  CO2 23 24 24 29   GLUCOSE 157* 175*  181* 144*  BUN 21* 20 22 18   CALCIUM 9.1 9.2 9.8 9.6  CREATININE 1.52* 1.74* 1.88* 1.58*  GFRNONAA 34* 29* 27* 32*  GFRAA 39* 33* 31* 37*    LIVER FUNCTION TESTS: Recent Labs    07/26/17 1555 09/01/17 1528 09/02/17 0411 09/24/17 1214  BILITOT 0.5 0.9 0.6 0.4  AST 30 34 25 18  ALT 34 22 19 21   ALKPHOS 73 76 74 79  PROT 7.3 7.7 6.9 7.1  ALBUMIN 3.7 4.3 3.5 4.1    TUMOR MARKERS: No results for input(s): AFPTM, CEA, CA199, CHROMGRNA in the last 8760 hours.  Assessment and Plan:  Rt sided headaches Left internal carotid aneurysm Consulted with Dr Estanislado Pandy Scheduled today for cerebral arteriogram with possible embolization Risks and benefits of cerebral angiogram with intervention were discussed with the patient including, but not limited to bleeding, infection, vascular injury, contrast induced renal failure, stroke or even death.  This interventional procedure involves the use of X-rays and because of the nature of the planned procedure, it is possible that we will have prolonged use of X-ray fluoroscopy.  Potential radiation risks to you include (but are not limited to) the following: - A slightly elevated risk for cancer  several years later in life. This risk is typically less than 0.5% percent. This risk is low in comparison to the normal incidence of human cancer, which is 33% for women and 50% for men according to the [place]. - Radiation induced injury can include skin redness, resembling a rash, tissue breakdown / ulcers and hair loss (which can be temporary or permanent).   The likelihood of either of these occurring depends on the difficulty of the procedure and whether you are sensitive to radiation  due to previous procedures, disease, or genetic conditions.   IF your procedure requires a prolonged use of radiation, you will be notified and given written instructions for further action.  It is your responsibility to monitor the irradiated area for the 2 weeks following the procedure and to notify your physician if you are concerned that you have suffered a radiation induced injury.    All of the patient's questions were answered, patient is agreeable to proceed. Consent signed and in chart.  Pt and family aware-- if intervention is performed-- she will be admitted to Neuro ICU overnight; plan to discharge in am Agreeable to plan   Thank you for this interesting consult.  I greatly enjoyed meeting Paula [name] and look forward to participating in their care.  A copy of this report was sent to the requesting provider on this date.  Electronically Signed: Lavonia Drafts, PA-C 12/14/2017, 7:26 AM   I spent a total of  30 Minutes   in face to face in clinical consultation, greater than 50% of which was counseling/coordinating care for cerebral arteriogram/ LICA aneurysm embolization

## 2017-12-14 NOTE — Anesthesia Preprocedure Evaluation (Addendum)
Anesthesia Evaluation  Patient identified by MRN, date of birth, ID band Patient awake    Reviewed: Allergy & Precautions, NPO status , Patient's Chart, lab work & pertinent test results  History of Anesthesia Complications (+) PONV  Airway Mallampati: III  TM Distance: <3 FB Neck ROM: Full    Dental no notable dental hx.    Pulmonary neg pulmonary ROS,    Pulmonary exam normal breath sounds clear to auscultation       Cardiovascular hypertension, + Peripheral Vascular Disease  Normal cardiovascular exam Rhythm:Regular Rate:Normal  EF 60%   Neuro/Psych negative neurological ROS  negative psych ROS   GI/Hepatic negative GI ROS, Neg liver ROS,   Endo/Other  diabetes  Renal/GU Renal InsufficiencyRenal disease  negative genitourinary   Musculoskeletal negative musculoskeletal ROS (+)   Abdominal   Peds negative pediatric ROS (+)  Hematology negative hematology ROS (+)   Anesthesia Other Findings   Reproductive/Obstetrics negative OB ROS                            Anesthesia Physical Anesthesia Plan  ASA: III  Anesthesia Plan: MAC   Post-op Pain Management:    Induction: Intravenous  PONV Risk Score and Plan: 3 and Ondansetron, Dexamethasone and Treatment may vary due to age or medical condition  Airway Management Planned: Simple Face Mask  Additional Equipment: Arterial line  Intra-op Plan:   Post-operative Plan:   Informed Consent: I have reviewed the patients History and Physical, chart, labs and discussed the procedure including the risks, benefits and alternatives for the proposed anesthesia with the patient or authorized representative who has indicated his/her understanding and acceptance.   Dental advisory given  Plan Discussed with: CRNA and Surgeon  Anesthesia Plan Comments:        Anesthesia Quick Evaluation

## 2017-12-14 NOTE — Anesthesia Postprocedure Evaluation (Signed)
Anesthesia Post Note  Patient: Paula [name]  Procedure(s) Performed: RADIOLOGY WITH ANESTHESIA EMBOLIZATION (N/A )     Patient location during evaluation: PACU Anesthesia Type: MAC Level of consciousness: awake and alert Pain management: pain level controlled Vital Signs Assessment: post-procedure vital signs reviewed and stable Respiratory status: spontaneous breathing, nonlabored ventilation, respiratory function stable and patient connected to nasal cannula oxygen Cardiovascular status: blood pressure returned to baseline and stable Postop Assessment: no apparent nausea or vomiting Anesthetic complications: no    Last Vitals:  Vitals:   12/14/17 0845 12/14/17 0850  BP: (!) 101/39 (!) 108/49  Pulse:    Resp:    Temp:    SpO2:      Last Pain:  Vitals:   12/14/17 0718  PainSc: 0-No pain                 [name] S

## 2017-12-14 NOTE — Discharge Instructions (Addendum)
Cerebral Angiogram, Care After °Refer to this sheet in the next few weeks. These instructions provide you with information on caring for yourself after your procedure. Your health care provider may also give you more specific instructions. Your treatment has been planned according to current medical practices, but problems sometimes occur. Call your health care provider if you have any problems or questions after your procedure. °What can I expect after the procedure? °After your procedure, it is typical to have the following: °· Bruising at the catheter insertion site that usually fades within 1-2 weeks. °· Blood collecting in the tissue (hematoma) that may be painful to the touch. It should usually decrease in size and tenderness within 1-2 weeks. °· A mild headache. ° °Follow these instructions at home: °· Take medicines only as directed by your health care provider. °· You may shower 24-48 hours after the procedure or as directed by your health care provider. Remove the bandage (dressing) and gently wash the site with plain soap and water. Pat the area dry with a clean towel. Do not rub the site, because this may cause bleeding. °· Do not take baths, swim, or use a hot tub until your health care provider approves. °· Check your insertion site every day for redness, swelling, or drainage. °· Do not apply powder or lotion to the site. °· Do not lift over 10 lb (4.5 kg) for 5 days after your procedure or as directed by your health care provider. °· Ask your health care provider when it is okay to: °? Return to work or school. °? Resume usual physical activities or sports. °? Resume sexual activity. °· Do not drive home if you are discharged the same day as the procedure. Have someone else drive you. °· You may drive 24 hours after the procedure unless otherwise instructed by your health care provider. °· Do not operate machinery or power tools for 24 hours after the procedure or as directed by your health care  provider. °· If your procedure was done as an outpatient procedure, which means that you went home the same day as your procedure, a responsible adult should be with you for the first 24 hours after you arrive home. °· Keep all follow-up visits as directed by your health care provider. This is important. °Contact a health care provider if: °· You have a fever. °· You have chills. °· You have increased bleeding from the catheter insertion site. Hold pressure on the site. °Get help right away if: °· You have vision changes or loss of vision. °· You have numbness or weakness on one side of your body. °· You have difficulty talking, or you have slurred speech or cannot speak (aphasia). °· You feel confused or have difficulty remembering. °· You have unusual pain at the catheter insertion site. °· You have redness, warmth, or swelling at the catheter insertion site. °· You have drainage (other than a small amount of blood on the dressing) from the catheter insertion site. °· The catheter insertion site is bleeding, and the bleeding does not stop after 30 minutes of holding steady pressure on the site. °These symptoms may represent a serious problem that is an emergency. Do not wait to see if the symptoms will go away. Get medical help right away. Call your local emergency services (911 in [place] Do not drive yourself to the hospital. °This information is not intended to replace advice given to you by your health care provider. Make sure you discuss any questions   you have with your health care provider. °Document Released: 12/12/2013 Document Revised: 01/03/2016 Document Reviewed: 08/10/2013 °Elsevier Interactive Patient Education © 2017 Elsevier Inc[employer] °Moderate Conscious Sedation, Adult, Care After °These instructions provide you with information about caring for yourself after your procedure. Your health care provider may also give you more specific instructions. Your treatment has been planned according to current  medical practices, but problems sometimes occur. Call your health care provider if you have any problems or questions after your procedure. °What can I expect after the procedure? °After your procedure, it is common: °· To feel sleepy for several hours. °· To feel clumsy and have poor balance for several hours. °· To have poor judgment for several hours. °· To vomit if you eat too soon. ° °Follow these instructions at home: °For at least 24 hours after the procedure: ° °· Do not: °? Participate in activities where you could fall or become injured. °? Drive. °? Use heavy machinery. °? Drink alcohol. °? Take sleeping pills or medicines that cause drowsiness. °? Make important decisions or sign legal documents. °? Take care of children on your own. °· Rest. °Eating and drinking °· Follow the diet recommended by your health care provider. °· If you vomit: °? Drink water, juice, or soup when you can drink without vomiting. °? Make sure you have little or no nausea before eating solid foods. °General instructions °· Have a responsible adult stay with you until you are awake and alert. °· Take over-the-counter and prescription medicines only as told by your health care provider. °· If you smoke, do not smoke without supervision. °· Keep all follow-up visits as told by your health care provider. This is important. °Contact a health care provider if: °· You keep feeling nauseous or you keep vomiting. °· You feel light-headed. °· You develop a rash. °· You have a fever. °Get help right away if: °· You have trouble breathing. °This information is not intended to replace advice given to you by your health care provider. Make sure you discuss any questions you have with your health care provider. °Document Released: 05/18/2013 Document Revised: 12/31/2015 Document Reviewed: 11/17/2015 °Elsevier Interactive Patient Education © 2018 Elsevier Inc. ° °

## 2017-12-14 NOTE — Sedation Documentation (Signed)
Pt arrived to room with CRNA AO x 4 speech clear  & appropriate moves all extremities well

## 2017-12-14 NOTE — Transfer of Care (Signed)
Immediate Anesthesia Transfer of Care Note  Patient: Paula [name]  Procedure(s) Performed: RADIOLOGY WITH ANESTHESIA EMBOLIZATION (N/A )  Patient Location: PACU and Short Stay  Anesthesia Type:MAC  Level of Consciousness: awake, alert , oriented and patient cooperative  Airway & Oxygen Therapy: Patient Spontanous Breathing in room air.  Post-op Assessment: Report given to RN and Post -op Vital signs reviewed and stable  Post vital signs: Reviewed and stable  Last Vitals:  Vitals Value Taken Time  BP    Temp    Pulse    Resp    SpO2      Last Pain:  Vitals:   12/14/17 0718  PainSc: 0-No pain      Patients Stated Pain Goal: 3 (59/29/24 4628)  Complications: No apparent anesthesia complications

## 2017-12-15 ENCOUNTER — Encounter (HOSPITAL_COMMUNITY): Payer: Self-pay | Admitting: Interventional Radiology

## 2017-12-16 ENCOUNTER — Encounter (HOSPITAL_COMMUNITY): Payer: Self-pay | Admitting: Interventional Radiology

## 2017-12-17 LAB — FUNGUS CULTURE WITH STAIN

## 2017-12-17 LAB — FUNGAL ORGANISM REFLEX

## 2017-12-17 LAB — FUNGUS CULTURE RESULT

## 2017-12-19 ENCOUNTER — Other Ambulatory Visit: Payer: Self-pay | Admitting: Family Medicine

## 2017-12-19 DIAGNOSIS — I10 Essential (primary) hypertension: Secondary | ICD-10-CM

## 2017-12-22 ENCOUNTER — Ambulatory Visit (INDEPENDENT_AMBULATORY_CARE_PROVIDER_SITE_OTHER): Payer: BLUE CROSS/BLUE SHIELD | Admitting: Emergency Medicine

## 2017-12-22 ENCOUNTER — Encounter: Payer: Self-pay | Admitting: Emergency Medicine

## 2017-12-22 VITALS — BP 106/68 | HR 70 | Ht 62.0 in | Wt 151.0 lb

## 2017-12-22 DIAGNOSIS — J3089 Other allergic rhinitis: Secondary | ICD-10-CM

## 2017-12-22 DIAGNOSIS — R059 Cough, unspecified: Secondary | ICD-10-CM

## 2017-12-22 DIAGNOSIS — K219 Gastro-esophageal reflux disease without esophagitis: Secondary | ICD-10-CM

## 2017-12-22 DIAGNOSIS — E119 Type 2 diabetes mellitus without complications: Secondary | ICD-10-CM

## 2017-12-22 DIAGNOSIS — R05 Cough: Secondary | ICD-10-CM

## 2017-12-22 LAB — PULMONARY FUNCTION TEST
DL/VA % pred: 82 %
DL/VA: 3.74 ml/min/mmHg/L
DLCO cor % pred: 63 %
DLCO cor: 13.76 ml/min/mmHg
DLCO unc % pred: 62 %
DLCO unc: 13.45 ml/min/mmHg
FEF 25-75 Post: 2.12 L/sec
FEF 25-75 Pre: 2.08 L/sec
FEF2575-%Change-Post: 1 %
FEF2575-%Pred-Post: 140 %
FEF2575-%Pred-Pre: 138 %
FEV1-%Change-Post: 3 %
FEV1-%Pred-Post: 119 %
FEV1-%Pred-Pre: 114 %
FEV1-Post: 1.94 L
FEV1-Pre: 1.87 L
FEV1FVC-%Change-Post: 4 %
FEV1FVC-%Pred-Pre: 106 %
FEV6-%Change-Post: 0 %
FEV6-%Pred-Post: 111 %
FEV6-%Pred-Pre: 112 %
FEV6-Post: 2.26 L
FEV6-Pre: 2.27 L
FEV6FVC-%Change-Post: 0 %
FEV6FVC-%Pred-Post: 104 %
FEV6FVC-%Pred-Pre: 103 %
FVC-%Change-Post: 0 %
FVC-%Pred-Post: 107 %
FVC-%Pred-Pre: 107 %
FVC-Post: 2.26 L
FVC-Pre: 2.28 L
Post FEV1/FVC ratio: 86 %
Post FEV6/FVC ratio: 100 %
Pre FEV1/FVC ratio: 82 %
Pre FEV6/FVC Ratio: 99 %
RV % pred: 98 %
RV: 2.09 L
TLC % pred: 101 %
TLC: 4.81 L

## 2017-12-22 LAB — GLUCOSE, POCT (MANUAL RESULT ENTRY): POC Glucose: 145 mg/dl — AB (ref 70–99)

## 2017-12-22 MED ORDER — MONTELUKAST SODIUM 10 MG PO TABS: 10.0000 mg | ORAL_TABLET | Freq: Every day | ORAL | 5 refills | Status: DC

## 2017-12-22 MED ORDER — FLUTICASONE PROPIONATE 50 MCG/ACT NA SUSP: 2.0000 | Freq: Every day | NASAL | 5 refills | Status: DC

## 2017-12-22 MED ORDER — PANTOPRAZOLE SODIUM 40 MG PO TBEC: 40.0000 mg | DELAYED_RELEASE_TABLET | Freq: Every day | ORAL | 5 refills | Status: DC

## 2017-12-22 NOTE — Progress Notes (Signed)
Subjective:    Patient ID: Paula [name], female    DOB: 1946-02-05, 72 y.o.   MRN: [id number]  Cough  Associated symptoms include postnasal drip, a sore throat, shortness of breath and wheezing. Pertinent negatives include no rash.   72 year old never smoker with a history of diabetes, hypertension with diastolic dysfunction, and chronic cough previously followed in our office by Dr. Joya [name].  She has a history of allergic rhinitis and GERD and it was felt that these were the most significant contributor to her coughing on her past evaluations.  She returns today describing an increase nasal drainage, cough that started about 2.5 weeks ago when she came off tramadol. She does not want to restart it. She is no longer on nasonex, was on an alternative nasal steroid that she stopped a month ago. She is no longer on a PPI.  Her cough is productive of clear mucous, she has a lot of nasal congestion.   ROV 11/16/17 --follow-up visit for patient with a history of diabetes, hypertension with diastolic dysfunction.  We have followed her for chronic cough in the setting of allergic rhinitis and GERD. I had her on nasal saline, protonix but she stopped. She used to benefit from tramadol, but this was stopped.   She was seen here about 1 month ago for upper airway irritation, globus sensation, and flaring of her cough.   She was started on chlorpheniramine as needed, Zantac, and was given gabapentin 100 mg 3 times daily. She apparently is no longer on these, if she took them at all.   She was already on Xyzal. CXR was done last time > no infiltrates noted. Planning for IR treatment brain aneurism w Dr Paula [name], needs surgical pre-op eval  ROV 12/22/17 --this is a follow-up visit for patient with a history of diabetes, hypertension and chronic cough.  I saw her approximately 1 month ago and her cough was quite active.  I continued her on chlorpheniramine, Xyzal.  We performed bronchoscopy on 11/18/2017 to  perform an upper airway evaluation which showed a very crowded larynx with some edema.  No significant cobblestoning or lesion noted.  The vocal cords and glottis were normal.  The rest of the entire airway exam was normal as well.  Bacterial culture, fungal and AFB smears are negative.  Cell count showed 43 white cells (43% neutrophils, 14% lymphocytes, 82% monocytes).  Cytology was normal.  She underwent pulmonary function testing today that I have reviewed.  This shows normal air flows without a bronchodilator response, normal lung volumes and a slightly decreased diffusion capacity that corrects to the normal range when it was adjusted for alveolar volume.  Her flow volume loops are grossly normal.  She still feels nasal gtt to her throat. She is taking both zyrtec and xyzal, but not singulair. She is not doing any nasal sprays or saline. Not currently on pantoprazole either.    Review of Systems  HENT: Positive for congestion, postnasal drip, sinus pressure, sneezing and sore throat.   Respiratory: Positive for cough, chest tightness, shortness of breath and wheezing.   Cardiovascular: Positive for palpitations.  Skin: Negative for rash.  Allergic/Immunologic: Negative.   Psychiatric/Behavioral: The patient is nervous/anxious.     Past Medical History:  Diagnosis Date  . Allergic rhinitis   . Arthritis   . Asthma   . Brain aneurysm   . Chronic kidney disease    Stage 3  . Cough   . Diabetes mellitus   .  Diastolic CHF, chronic ([place]) 10/11/2011  . GERD (gastroesophageal reflux disease)   . History of colon polyps 2012   tubular adenoma   . Hyperlipidemia   . Hypertension   . Neuropathy 10/11/2011  . PONV (postoperative nausea and vomiting)    one time after lymph node surgery     Family History  Problem Relation Age of Onset  . Hypertension Mother   . Asthma Sister   . Colon cancer Neg Hx   . Allergic rhinitis Neg Hx   . Eczema Neg Hx      Social History   Socioeconomic  History  . Marital status: Widowed    Spouse name: Not on file  . Number of children: 4  . Years of education: Not on file  . Highest education level: Not on file  Occupational History  . Occupation: Unemployed  Social Needs  . Financial resource strain: Not on file  . Food insecurity:    Worry: Not on file    Inability: Not on file  . Transportation needs:    Medical: Not on file    Non-medical: Not on file  Tobacco Use  . Smoking status: Never Smoker  . Smokeless tobacco: Never Used  Substance and Sexual Activity  . Alcohol use: No  . Drug use: No  . Sexual activity: Not Currently    Birth control/protection: None  Lifestyle  . Physical activity:    Days per week: Not on file    Minutes per session: Not on file  . Stress: Not on file  Relationships  . Social connections:    Talks on phone: Not on file    Gets together: Not on file    Attends religious service: Not on file    Active member of club or organization: Not on file    Attends meetings of clubs or organizations: Not on file    Relationship status: Not on file  . Intimate partner violence:    Fear of current or ex partner: Not on file    Emotionally abused: Not on file    Physically abused: Not on file    Forced sexual activity: Not on file  Other Topics Concern  . Not on file  Social History Narrative   Paula [name] lives in a 72 year old apartment with no concern for water damage or mildew.  Flooring is carpet throughout.  Heating is electric and cooling is central.  There are no animals located in the home, however, there is 1 dog across the hallway.  There are no roaches in the home and there are no dust mite free covers on the bed or pillow.  She is exposed to tobacco smoke in her home.  There are no concerns for exposure to fumes, chemicals, or dust in the home.  Originally from [name], has lived [place], [place], [place] Has worked as Education officer, museum   Allergies  Allergen Reactions  . Dust Mite Extract Cough  . Ace  Inhibitors Cough    chronic cough  . Lovastatin Other (See Comments)    Generalized body pain     Outpatient Medications Prior to Visit  Medication Sig Dispense Refill  . acetaminophen (TYLENOL) 500 MG tablet Take 500 mg by mouth every 6 (six) hours as needed for headache.    . albuterol (PROVENTIL HFA;VENTOLIN HFA) 108 (90 Base) MCG/ACT inhaler INHALE TWO PUFFS INTO LUNGS EVERY 6 HOURS AS NEEDED FOR SHORTNESS OF BREATH 1 Inhaler 0  . aspirin 81 MG tablet Take 81 mg by  mouth daily.      . calcium carbonate (OSCAL) 1500 (600 Ca) MG TABS tablet Take 1,500 mg by mouth daily with breakfast.    . carvedilol (COREG) 3.125 MG tablet TAKE 1 TABLET BY MOUTH TWICE DAILY WITH A MEAL 60 tablet 3  . cetirizine (ZYRTEC) 10 MG tablet Take 1 tablet (10 mg total) by mouth daily. 90 tablet 1  . gabapentin (NEURONTIN) 100 MG capsule Take 1 capsule (100 mg total) by mouth 3 (three) times daily. One three times daily 90 capsule 2  . glipiZIDE (GLUCOTROL) 10 MG tablet Take 1 tablet (10 mg total) by mouth 2 (two) times daily before a meal. 60 tablet 3  . glucose blood (TRUE METRIX BLOOD GLUCOSE TEST) test strip Use as instructed TID 100 each 12  . isosorbide mononitrate (IMDUR) 60 MG 24 hr tablet Take 1 tablet (60 mg total) by mouth daily. 90 tablet 1  . levocetirizine (XYZAL) 5 MG tablet Take 1 tablet (5 mg total) by mouth every evening. 30 tablet 5  . montelukast (SINGULAIR) 10 MG tablet Take 1 tablet (10 mg total) by mouth at bedtime. 30 tablet 1  . pantoprazole (PROTONIX) 40 MG tablet Take 1 tablet (40 mg total) by mouth daily. 30 tablet 5  . Polyethyl Glycol-Propyl Glycol (SYSTANE) 0.4-0.3 % SOLN Place 1 drop into both eyes 2 (two) times daily as needed (dry eyes).     . rosuvastatin (CRESTOR) 5 MG tablet Take 1 tablet (5 mg total) by mouth daily. 90 tablet 1  . verapamil (CALAN) 80 MG tablet Take 1 tablet (80 mg total) by mouth 2 (two) times daily. 180 tablet 1  . Camphor-Eucalyptus-Menthol (CHEST RUB EX)  Apply 1 application topically daily as needed (cough).     No facility-administered medications prior to visit.         Objective:   Physical Exam  Vitals:   12/22/17 1041 12/22/17 1042  BP:  106/68  Pulse:  70  SpO2:  96%  Weight: 151 lb (68.5 kg)   Height: 5\' 2"  (1.575 m)    Gen: Pleasant, well-nourished, in no distress,  normal affect  ENT: No lesions,  mouth clear,  oropharynx clear, no postnasal drip but some apparent congestion  Neck: No JVD, no stridor  Lungs: No use of accessory muscles, no wheezing, no crackles  Cardiovascular: RRR, heart sounds normal, no murmur or gallops, no peripheral edema  Musculoskeletal: No deformities, no cyanosis or clubbing  Neuro: alert, non focal  Skin: Warm, no lesions or rashes                                                                           Assessment & Plan:  Cough Continues to have cough, it may be slightly improved since last visit.  Pulmonary function testing does not support a productive lung disease.  Her bronchoscopy was reassuring with the exception of some upper airway erythema and narrowing without cobblestoning.  She is not currently on her Singulair, nasal spray.  She is also not taking her pantoprazole although she was confused about all these medications.  We will try to ramp up her allergy regimen, get her back on a GERD regimen.  Allergic rhinitis She has  Xyzal and Zyrtec both in her medications, is not taking the Singulair or any nasal steroid.  Instructed her to only take either the Xyzal or Zyrtec daily, will add back the Singulair and fluticasone nasal spray.  GERD (gastroesophageal reflux disease) Add back pantoprazole 40 mg daily.  Baltazar Apo, MD, PhD 12/22/2017, 11:14 AM [facility] [phone] or if no answer [phone]

## 2017-12-22 NOTE — Patient Instructions (Signed)
Please take either Xyzal or Zyrtec once a day (you do not need to take both) We will restart Singulair 10 mg every evening We will restart fluticasone nasal spray, 2 sprays each nostril once a day. We will restart pantoprazole 40 mg once a day.  Remember to take this medication either 1 hour before or 1 hour after eating. We do not need to start any new inhaled medications at this time. Follow with Dr Lamonte Sakai in 6 months or sooner if you have any problems

## 2017-12-22 NOTE — Assessment & Plan Note (Signed)
Continues to have cough, it may be slightly improved since last visit.  Pulmonary function testing does not support a productive lung disease.  Her bronchoscopy was reassuring with the exception of some upper airway erythema and narrowing without cobblestoning.  She is not currently on her Singulair, nasal spray.  She is also not taking her pantoprazole although she was confused about all these medications.  We will try to ramp up her allergy regimen, get her back on a GERD regimen.

## 2017-12-22 NOTE — Assessment & Plan Note (Signed)
She has Xyzal and Zyrtec both in her medications, is not taking the Singulair or any nasal steroid.  Instructed her to only take either the Xyzal or Zyrtec daily, will add back the Singulair and fluticasone nasal spray.

## 2017-12-22 NOTE — Addendum Note (Signed)
Addended by: Desmond Dike C on: 12/22/2017 11:24 AM   Modules accepted: Orders

## 2017-12-22 NOTE — Progress Notes (Signed)
Patient completed full PFT today. Patient had hard time with Pleth, was only able to complete two, and did not want to do Nitrogen Washout due to productive coughing during the testing today.

## 2017-12-22 NOTE — Assessment & Plan Note (Signed)
Add back pantoprazole 40 mg daily.

## 2017-12-24 ENCOUNTER — Other Ambulatory Visit: Payer: Self-pay | Admitting: Family Medicine

## 2017-12-25 MED FILL — TRUE METRIX TEST STRIP: 30 days supply | Qty: 100 | Fill #1

## 2017-12-30 LAB — ACID FAST CULTURE WITH REFLEXED SENSITIVITIES (MYCOBACTERIA): Acid Fast Culture: NEGATIVE

## 2018-01-07 ENCOUNTER — Ambulatory Visit: Payer: BLUE CROSS/BLUE SHIELD | Admitting: Allergy

## 2018-01-07 ENCOUNTER — Encounter: Payer: Self-pay | Admitting: Allergy

## 2018-01-07 VITALS — BP 122/70 | HR 73 | Resp 17

## 2018-01-07 DIAGNOSIS — J310 Chronic rhinitis: Secondary | ICD-10-CM

## 2018-01-07 NOTE — Progress Notes (Signed)
Follow-up Note  RE: Paula [name] MRN: [id number] DOB: Feb 14, 1946 Date of Office Visit: 01/07/2018   Brief review of history since last visit: Paula [name] is a 72 y.o. female presenting today for allergy testing.  She has history of chronic rhinoconjunctivitis and GERD.  However she states that she took zyrtec this morning.  She was last seen in the office on 10/01/17 by our nurse practitioner.  In the interim she has seen [facility] Dr Melvyn Novas and Dr. Malvin Johns who believes she has upper airway cough syndrome and GERD leading to her persistent cough.  She has undergone bronchoscopy which per report revealed  "very crowded larynx with some edema, no significant cobblestoning or lesion noted.  The vocal cords and glottis were normal.  The rest of the entire airway exam was normal as well.  Bacterial culture, fungal and AFB smears are negative.  Cell count showed 43 white cells (43% neutrophils, 14% lymphocytes, 82% monocytes).  Cytology was normal."   She also has full PFTs done which per report showed "normal air flows without a bronchodilator response, normal lung volumes and a slightly decreased diffusion capacity that corrects to the normal range when it was adjusted for alveolar volume.  Her flow volume loops are grossly normal."      She states she is currently on Zyrtec, flonase, singulair and protonix and is still quite symptomatic.     She did have an environmental allergy panel done by pulmonary that was negative (see below).  Hence appt today for allergy testing.    Medication List: Allergies as of 01/07/2018      Reactions   Dust Mite Extract Cough   Ace Inhibitors Cough   chronic cough   Lovastatin Other (See Comments)   Generalized body pain      Medication List        Accurate as of 01/07/18 10:32 AM. Always use your most recent med list.          acetaminophen 500 MG tablet Commonly known as:  TYLENOL Take 500 mg by mouth every 6 (six) hours as needed for  headache.   aspirin 81 MG tablet Take 81 mg by mouth daily.   calcium carbonate 1500 (600 Ca) MG Tabs tablet Commonly known as:  OSCAL Take 1,500 mg by mouth daily with breakfast.   carvedilol 3.125 MG tablet Commonly known as:  COREG TAKE 1 TABLET BY MOUTH TWICE DAILY WITH A MEAL   cetirizine 10 MG tablet Commonly known as:  ZYRTEC Take 1 tablet (10 mg total) by mouth daily.   fluticasone 50 MCG/ACT nasal spray Commonly known as:  FLONASE Place 2 sprays into both nostrils daily.   gabapentin 100 MG capsule Commonly known as:  NEURONTIN Take 1 capsule (100 mg total) by mouth 3 (three) times daily. One three times daily   glipiZIDE 10 MG tablet Commonly known as:  GLUCOTROL TAKE 1 TABLET BY MOUTH TWICE DAILY BEFORE MEAL(S)   glucose blood test strip Commonly known as:  TRUE METRIX BLOOD GLUCOSE TEST Use as instructed TID   isosorbide mononitrate 60 MG 24 hr tablet Commonly known as:  IMDUR Take 1 tablet (60 mg total) by mouth daily.   levocetirizine 5 MG tablet Commonly known as:  XYZAL Take 1 tablet (5 mg total) by mouth every evening.   montelukast 10 MG tablet Commonly known as:  SINGULAIR Take 1 tablet (10 mg total) by mouth at bedtime.   pantoprazole 40 MG tablet Commonly known  as:  PROTONIX Take 1 tablet (40 mg total) by mouth daily.   rosuvastatin 5 MG tablet Commonly known as:  CRESTOR Take 1 tablet (5 mg total) by mouth daily.   SYSTANE 0.4-0.3 % Soln Generic drug:  Polyethyl Glycol-Propyl Glycol Place 1 drop into both eyes 2 (two) times daily as needed (dry eyes).   verapamil 80 MG tablet Commonly known as:  CALAN Take 1 tablet (80 mg total) by mouth 2 (two) times daily.       Known medication allergies: Allergies  Allergen Reactions  . Dust Mite Extract Cough  . Ace Inhibitors Cough    chronic cough  . Lovastatin Other (See Comments)    Generalized body pain     Physical examination: Blood pressure 122/70, pulse 73, resp. rate 17,  SpO2 98 %.   PE not performed today  Diagnositics/Labs: Labs:  Component     Latest Ref Rng & Units 10/12/2017         3:59 PM  Allergen, D pternoyssinus,d7     kU/L <0.10  Class      0  D. farinae     kU/L <0.10  Allergen, P. notatum, m1     kU/L <0.10  CLADOSPORIUM HERBARUM (M2) IGE     kU/L <0.10  Aspergillus fumigatus, m3     kU/L <0.10  Allergen, A. alternata, m6     kU/L <0.10  Cat Dander     kU/L <0.10  Dog Dander     kU/L <0.10  Cockroach     kU/L <0.10  [place] IgE     kU/L <0.10  Allergen, Comm Silver Wendee Copp, t9     kU/L <0.10  Allergen, Cedar tree, t12     kU/L <0.10  Allergen, Cottonwood, t14     kU/L <0.10  Allergen, Oak,t7     kU/L <0.10  Elm IgE     kU/L <0.10  Pecan/Hickory Tree IgE     kU/L <0.10  Allergen, Mulberry, t76     kU/L <0.10  [place] Grass     kU/L <0.10  Timothy Grass     kU/L <0.10  [place] Grass     kU/L <0.10  COMMON RAGWEED (SHORT) (W1) IGE     kU/L <0.10  Rough Pigweed  IgE     kU/L <0.10  Sheep Sorrel IgE     kU/L <0.10  Allergen, Mouse Urine Protein, e78     kU/L <0.10  IgE (Immunoglobulin E), Serum     <OR=114 kU/L 5   A/P: chronic rhinitis, GERD Unable to perform allergy skin prick testing due to recent use of antihistamine.   I advised that if she wants to have skin testing done she needs to hold all antihistamines for 3 days.  I did discuss will refer to ENT as she continues to have significant cough, nasal drainage and globus sensation despite nasal sprays, antihistamine and singulair on board.  She did have evidence of airway crowding in post-pharynx per bronchscopy report.   She will return when she can be off antihistamines.    Sincerely,   Prudy Feeler, MD Allergy/Immunology [facility] [facility]

## 2018-01-08 ENCOUNTER — Telehealth: Payer: Self-pay

## 2018-01-08 NOTE — Telephone Encounter (Signed)
Referral placed to Dr. Teoh.  

## 2018-01-08 NOTE — Telephone Encounter (Signed)
Noted  

## 2018-01-08 NOTE — Telephone Encounter (Signed)
-----   Message from Herbie Drape, LPN sent at 4/44/6190 11:16 AM EDT ----- Please refer patient to ENT for persistent non allergic nasal drainage with globus sensations with inadequate response to medication. Thank you.

## 2018-01-11 ENCOUNTER — Encounter: Payer: Self-pay | Admitting: Neurology

## 2018-01-11 ENCOUNTER — Encounter: Payer: Self-pay | Admitting: Family Medicine

## 2018-01-11 ENCOUNTER — Telehealth: Payer: Self-pay | Admitting: *Deleted

## 2018-01-11 ENCOUNTER — Ambulatory Visit: Payer: BLUE CROSS/BLUE SHIELD | Attending: Family Medicine | Admitting: Family Medicine

## 2018-01-11 VITALS — BP 124/71 | HR 64 | Temp 98.2°F | Ht 62.0 in | Wt 150.0 lb

## 2018-01-11 DIAGNOSIS — Z780 Asymptomatic menopausal state: Secondary | ICD-10-CM | POA: Insufficient documentation

## 2018-01-11 DIAGNOSIS — K089 Disorder of teeth and supporting structures, unspecified: Secondary | ICD-10-CM | POA: Insufficient documentation

## 2018-01-11 DIAGNOSIS — E7800 Pure hypercholesterolemia, unspecified: Secondary | ICD-10-CM

## 2018-01-11 DIAGNOSIS — R058 Other specified cough: Secondary | ICD-10-CM

## 2018-01-11 DIAGNOSIS — E1140 Type 2 diabetes mellitus with diabetic neuropathy, unspecified: Secondary | ICD-10-CM | POA: Diagnosis not present

## 2018-01-11 DIAGNOSIS — Z7984 Long term (current) use of oral hypoglycemic drugs: Secondary | ICD-10-CM | POA: Insufficient documentation

## 2018-01-11 DIAGNOSIS — K009 Disorder of tooth development, unspecified: Secondary | ICD-10-CM

## 2018-01-11 DIAGNOSIS — E2839 Other primary ovarian failure: Secondary | ICD-10-CM | POA: Diagnosis not present

## 2018-01-11 DIAGNOSIS — I10 Essential (primary) hypertension: Secondary | ICD-10-CM

## 2018-01-11 DIAGNOSIS — Z1231 Encounter for screening mammogram for malignant neoplasm of breast: Secondary | ICD-10-CM | POA: Diagnosis not present

## 2018-01-11 DIAGNOSIS — E1122 Type 2 diabetes mellitus with diabetic chronic kidney disease: Secondary | ICD-10-CM | POA: Diagnosis not present

## 2018-01-11 DIAGNOSIS — Z7982 Long term (current) use of aspirin: Secondary | ICD-10-CM | POA: Insufficient documentation

## 2018-01-11 DIAGNOSIS — J45909 Unspecified asthma, uncomplicated: Secondary | ICD-10-CM | POA: Insufficient documentation

## 2018-01-11 DIAGNOSIS — K219 Gastro-esophageal reflux disease without esophagitis: Secondary | ICD-10-CM | POA: Diagnosis not present

## 2018-01-11 DIAGNOSIS — Z86010 Personal history of colonic polyps: Secondary | ICD-10-CM | POA: Insufficient documentation

## 2018-01-11 DIAGNOSIS — G4489 Other headache syndrome: Secondary | ICD-10-CM

## 2018-01-11 DIAGNOSIS — N183 Chronic kidney disease, stage 3 (moderate): Secondary | ICD-10-CM | POA: Insufficient documentation

## 2018-01-11 DIAGNOSIS — I5032 Chronic diastolic (congestive) heart failure: Secondary | ICD-10-CM | POA: Diagnosis not present

## 2018-01-11 DIAGNOSIS — I130 Hypertensive heart and chronic kidney disease with heart failure and stage 1 through stage 4 chronic kidney disease, or unspecified chronic kidney disease: Secondary | ICD-10-CM | POA: Diagnosis not present

## 2018-01-11 DIAGNOSIS — N644 Mastodynia: Secondary | ICD-10-CM

## 2018-01-11 DIAGNOSIS — J309 Allergic rhinitis, unspecified: Secondary | ICD-10-CM | POA: Diagnosis not present

## 2018-01-11 DIAGNOSIS — R05 Cough: Secondary | ICD-10-CM

## 2018-01-11 DIAGNOSIS — Z79899 Other long term (current) drug therapy: Secondary | ICD-10-CM | POA: Diagnosis not present

## 2018-01-11 DIAGNOSIS — Z1239 Encounter for other screening for malignant neoplasm of breast: Secondary | ICD-10-CM

## 2018-01-11 DIAGNOSIS — Z7951 Long term (current) use of inhaled steroids: Secondary | ICD-10-CM | POA: Diagnosis not present

## 2018-01-11 DIAGNOSIS — E119 Type 2 diabetes mellitus without complications: Secondary | ICD-10-CM | POA: Diagnosis not present

## 2018-01-11 LAB — GLUCOSE, POCT (MANUAL RESULT ENTRY): POC Glucose: 123 mg/dl — AB (ref 70–99)

## 2018-01-11 MED ORDER — ONETOUCH ULTRASOFT LANCETS MISC: 12 refills | Status: DC

## 2018-01-11 MED ORDER — ISOSORBIDE MONONITRATE ER 60 MG PO TB24: 60.0000 mg | ORAL_TABLET | Freq: Every day | ORAL | 1 refills | Status: DC

## 2018-01-11 MED ORDER — ONETOUCH ULTRA 2 W/DEVICE KIT: 1.0000 | PACK | Freq: Three times a day (TID) | 0 refills | Status: DC

## 2018-01-11 MED ORDER — ROSUVASTATIN CALCIUM 5 MG PO TABS: 5.0000 mg | ORAL_TABLET | Freq: Every day | ORAL | 1 refills | Status: DC

## 2018-01-11 MED ORDER — VERAPAMIL HCL 80 MG PO TABS: 80.0000 mg | ORAL_TABLET | Freq: Two times a day (BID) | ORAL | 1 refills | Status: DC

## 2018-01-11 MED ORDER — GABAPENTIN 100 MG PO CAPS: 100.0000 mg | ORAL_CAPSULE | Freq: Three times a day (TID) | ORAL | 2 refills | Status: DC

## 2018-01-11 MED ORDER — GLUCOSE BLOOD VI STRP: ORAL_STRIP | 12 refills | Status: DC

## 2018-01-11 NOTE — Patient Instructions (Signed)
Diabetes Mellitus and Nutrition When you have diabetes (diabetes mellitus), it is very important to have healthy eating habits because your blood sugar (glucose) levels are greatly affected by what you eat and drink. Eating healthy foods in the appropriate amounts, at about the same times every day, can help you:  Control your blood glucose.  Lower your risk of heart disease.  Improve your blood pressure.  Reach or maintain a healthy weight.  Every person with diabetes is different, and each person has different needs for a meal plan. Your health care provider may recommend that you work with a diet and nutrition specialist (dietitian) to make a meal plan that is best for you. Your meal plan may vary depending on factors such as:  The calories you need.  The medicines you take.  Your weight.  Your blood glucose, blood pressure, and cholesterol levels.  Your activity level.  Other health conditions you have, such as heart or kidney disease.  How do carbohydrates affect me? Carbohydrates affect your blood glucose level more than any other type of food. Eating carbohydrates naturally increases the amount of glucose in your blood. Carbohydrate counting is a method for keeping track of how many carbohydrates you eat. Counting carbohydrates is important to keep your blood glucose at a healthy level, especially if you use insulin or take certain oral diabetes medicines. It is important to know how many carbohydrates you can safely have in each meal. This is different for every person. Your dietitian can help you calculate how many carbohydrates you should have at each meal and for snack. Foods that contain carbohydrates include:  Bread, cereal, rice, pasta, and crackers.  Potatoes and corn.  Peas, beans, and lentils.  Milk and yogurt.  Fruit and juice.  Desserts, such as cakes, cookies, ice cream, and candy.  How does alcohol affect me? Alcohol can cause a sudden decrease in blood  glucose (hypoglycemia), especially if you use insulin or take certain oral diabetes medicines. Hypoglycemia can be a life-threatening condition. Symptoms of hypoglycemia (sleepiness, dizziness, and confusion) are similar to symptoms of having too much alcohol. If your health care provider says that alcohol is safe for you, follow these guidelines:  Limit alcohol intake to no more than 1 drink per day for nonpregnant women and 2 drinks per day for men. One drink equals 12 oz of beer, 5 oz of wine, or 1 oz of hard liquor.  Do not drink on an empty stomach.  Keep yourself hydrated with water, diet soda, or unsweetened iced tea.  Keep in mind that regular soda, juice, and other mixers may contain a lot of sugar and must be counted as carbohydrates.  What are tips for following this plan? Reading food labels  Start by checking the serving size on the label. The amount of calories, carbohydrates, fats, and other nutrients listed on the label are based on one serving of the food. Many foods contain more than one serving per package.  Check the total grams (g) of carbohydrates in one serving. You can calculate the number of servings of carbohydrates in one serving by dividing the total carbohydrates by 15. For example, if a food has 30 g of total carbohydrates, it would be equal to 2 servings of carbohydrates.  Check the number of grams (g) of saturated and trans fats in one serving. Choose foods that have low or no amount of these fats.  Check the number of milligrams (mg) of sodium in one serving. Most people   should limit total sodium intake to less than 2,300 mg per day.  Always check the nutrition information of foods labeled as "low-fat" or "nonfat". These foods may be higher in added sugar or refined carbohydrates and should be avoided.  Talk to your dietitian to identify your daily goals for nutrients listed on the label. Shopping  Avoid buying canned, premade, or processed foods. These  foods tend to be high in fat, sodium, and added sugar.  Shop around the outside edge of the grocery store. This includes fresh fruits and vegetables, bulk grains, fresh meats, and fresh dairy. Cooking  Use low-heat cooking methods, such as baking, instead of high-heat cooking methods like deep frying.  Cook using healthy oils, such as olive, canola, or sunflower oil.  Avoid cooking with butter, cream, or high-fat meats. Meal planning  Eat meals and snacks regularly, preferably at the same times every day. Avoid going long periods of time without eating.  Eat foods high in fiber, such as fresh fruits, vegetables, beans, and whole grains. Talk to your dietitian about how many servings of carbohydrates you can eat at each meal.  Eat 4-6 ounces of lean protein each day, such as lean meat, chicken, fish, eggs, or tofu. 1 ounce is equal to 1 ounce of meat, chicken, or fish, 1 egg, or 1/4 cup of tofu.  Eat some foods each day that contain healthy fats, such as avocado, nuts, seeds, and fish. Lifestyle   Check your blood glucose regularly.  Exercise at least 30 minutes 5 or more days each week, or as told by your health care provider.  Take medicines as told by your health care provider.  Do not use any products that contain nicotine or tobacco, such as cigarettes and e-cigarettes. If you need help quitting, ask your health care provider.  Work with a counselor or diabetes educator to identify strategies to manage stress and any emotional and social challenges. What are some questions to ask my health care provider?  Do I need to meet with a diabetes educator?  Do I need to meet with a dietitian?  What number can I call if I have questions?  When are the best times to check my blood glucose? Where to find more information:  American Diabetes Association: diabetes.org/food-and-fitness/food  Academy of Nutrition and Dietetics:  www.eatright.org/resources/health/diseases-and-conditions/diabetes  National Institute of Diabetes and Digestive and Kidney Diseases (NIH): www.niddk.nih.gov/health-information/diabetes/overview/diet-eating-physical-activity Summary  A healthy meal plan will help you control your blood glucose and maintain a healthy lifestyle.  Working with a diet and nutrition specialist (dietitian) can help you make a meal plan that is best for you.  Keep in mind that carbohydrates and alcohol have immediate effects on your blood glucose levels. It is important to count carbohydrates and to use alcohol carefully. This information is not intended to replace advice given to you by your health care provider. Make sure you discuss any questions you have with your health care provider. Document Released: 04/24/2005 Document Revised: 09/01/2016 Document Reviewed: 09/01/2016 Elsevier Interactive Patient Education  2018 Elsevier Inc.  

## 2018-01-11 NOTE — Telephone Encounter (Signed)
Patient verified DOB Due to pain patient has to have an order for Diagnostic Screening.

## 2018-01-12 ENCOUNTER — Encounter: Payer: Self-pay | Admitting: Family Medicine

## 2018-01-12 LAB — MICROALBUMIN / CREATININE URINE RATIO
Creatinine, Urine: 99.9 mg/dL
Microalb/Creat Ratio: 1213.2 mg/g creat — ABNORMAL HIGH (ref 0.0–30.0)
Microalbumin, Urine: 1212 ug/mL

## 2018-01-12 NOTE — Progress Notes (Signed)
Subjective:  Patient ID: Paula [name], female    DOB: 1946/05/18  Age: 72 y.o. 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 Her A1c is 6.6 and has improved significantly from 8.3 and she endorses compliant with a diabetic diet and her medications.  Her blood pressure is also controlled and she is doing well on her antihypertensives with no adverse effects.  On 12/14/2017 she underwent a cerebral angiogram with findings below: IMPRESSION. 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 PLAN: The findings were reviewed with the patient and the patient's daughter. It was decided to continue with conservative management with neuroimaging surveillance with a six-month MRI MRA brain.  She complains of intermittent headaches which she describes as feeling like a "hollow" in her posterior head and is requesting neurology referral.  She denies visual concerns, nausea or vomiting. She also continues to cough with associated postnasal drip uncontrolled on Zyrtec, Xyzal, Flonase, Singulair and was seen by allergy and asthma center on 01/07/2018 and referred to ENT Last seen by [facility]Dr. Melvyn Novas on 12/22/17 and as per notes PFT does not support a productive lung disease, bronchoscopy revealed upper airway erythema and narrowing without cobblestoning.  She is requesting a referral to the dentist for routine dental cleaning.  Past Medical History:  Diagnosis Date  . Allergic rhinitis   . Arthritis   . Asthma   . Brain aneurysm   . Chronic  kidney disease    Stage 3  . Cough   . Diabetes mellitus   . Diastolic CHF, chronic ([place]) 10/11/2011  . GERD (gastroesophageal reflux disease)   . History of colon polyps 2012   tubular adenoma   . Hyperlipidemia   . Hypertension   . Neuropathy 10/11/2011  . PONV (postoperative nausea and vomiting)    one time after lymph node surgery    Past Surgical History:  Procedure Laterality Date  . CATARACT EXTRACTION     right eye  . IR 3D INDEPENDENT WKST  12/14/2017  . IR ANGIO INTRA EXTRACRAN SEL COM CAROTID INNOMINATE BILAT MOD SED  09/15/2017  . IR ANGIO INTRA EXTRACRAN SEL COM CAROTID INNOMINATE UNI L MOD SED  12/14/2017  . IR ANGIO VERTEBRAL SEL VERTEBRAL BILAT MOD SED  09/15/2017  . IR RADIOLOGIST EVAL & MGMT  09/10/2017  . IR RADIOLOGIST EVAL & MGMT  10/19/2017  . lymphatic mass surgery    . lymphoma mass surgery Bilateral    non cancerous  . NASAL TURBINATE REDUCTION    . RADIOLOGY WITH ANESTHESIA N/A 12/14/2017   Procedure: RADIOLOGY WITH ANESTHESIA EMBOLIZATION;  Surgeon: Luanne Bras, MD;  Location: [facility];  Service: Radiology;  Laterality: N/A;  . VIDEO BRONCHOSCOPY Bilateral 11/18/2017   Procedure: VIDEO BRONCHOSCOPY WITHOUT FLUORO;  Surgeon: Collene Gobble, MD;  Location: [facility];  Service: Cardiopulmonary;  Laterality: Bilateral;    Allergies  Allergen Reactions  . Dust Mite Extract Cough  . Ace Inhibitors Cough    chronic cough  . Lovastatin Other (See Comments)    Generalized body pain    Outpatient Medications Prior to Visit  Medication Sig Dispense Refill  . acetaminophen (TYLENOL) 500 MG tablet Take 500 mg by mouth every 6 (six) hours as needed for headache.    Marland Kitchen aspirin 81 MG tablet Take 81 mg by mouth daily.      . calcium carbonate (OSCAL) 1500 (600 Ca) MG TABS tablet Take 1,500 mg by mouth daily with breakfast.    . carvedilol (COREG) 3.125 MG tablet TAKE 1 TABLET BY MOUTH TWICE DAILY WITH A MEAL 60 tablet 3  . cetirizine (ZYRTEC) 10 MG tablet Take 1  tablet (10 mg total) by mouth daily. 90 tablet 1  . fluticasone (FLONASE) 50 MCG/ACT nasal spray Place 2 sprays into both nostrils daily. 16 g 5  . glipiZIDE (GLUCOTROL) 10 MG tablet TAKE 1 TABLET BY MOUTH TWICE DAILY BEFORE MEAL(S) 60 tablet 3  . glucose blood (TRUE METRIX BLOOD GLUCOSE TEST) test strip Use as instructed TID 100 each 12  . levocetirizine (XYZAL) 5 MG tablet Take 1 tablet (5 mg total) by mouth every evening. 30 tablet 5  . montelukast (SINGULAIR) 10 MG tablet Take 1 tablet (10 mg total) by mouth at bedtime. 30 tablet 5  . pantoprazole (PROTONIX) 40 MG tablet Take 1 tablet (40 mg total) by mouth daily. 30 tablet 5  . Polyethyl Glycol-Propyl Glycol (SYSTANE) 0.4-0.3 % SOLN Place 1 drop into both eyes 2 (two) times daily as needed (dry eyes).     . gabapentin (NEURONTIN) 100 MG capsule Take 1 capsule (100 mg total) by mouth 3 (three) times daily. One three times daily 90 capsule 2  . isosorbide mononitrate (IMDUR) 60 MG 24 hr tablet Take 1 tablet (60 mg total) by mouth daily. 90 tablet 1  . rosuvastatin (CRESTOR) 5 MG tablet Take 1 tablet (5 mg total) by mouth daily. 90 tablet 1  . verapamil (CALAN) 80 MG tablet Take 1 tablet (80 mg total) by mouth 2 (two) times daily. 180 tablet 1   No facility-administered medications prior to visit.     ROS Review of Systems  Constitutional: Negative for activity change, appetite change and fatigue.  HENT: Negative for congestion, sinus pressure and sore throat.   Eyes: Negative for visual disturbance.  Respiratory: Positive for cough. Negative for chest tightness, shortness of breath and wheezing.   Cardiovascular: Negative for chest pain and palpitations.  Gastrointestinal: Negative for abdominal distention, abdominal pain and constipation.  Endocrine: Negative for polydipsia.  Genitourinary: Negative for dysuria and frequency.  Musculoskeletal: Negative for arthralgias and back pain.  Skin: Negative for rash.  Neurological: Positive  for headaches. Negative for tremors, light-headedness and numbness.  Hematological: Does not bruise/bleed easily.  Psychiatric/Behavioral: Negative for agitation and behavioral problems.    Objective:  BP 124/71   Pulse 64   Temp 98.2 F (36.8 C) (Oral)   Ht _0  (1.575 m)   Wt 150 lb (68 kg)   LMP  (LMP Unknown)   SpO2 98%   BMI 27.44 kg/m   BP/Weight 01/11/2018 01/07/2018 1/88/4166  Systolic BP 063 016 010  Diastolic BP 71 70 68  Wt. (Lbs) 150 - 151  BMI 27.44 - 27.62     Physical Exam  Constitutional: She is oriented to person, place, and time. She appears well-developed and well-nourished.  Cardiovascular: Normal rate, normal heart sounds and intact distal pulses.  No murmur heard. Pulmonary/Chest: Effort normal and breath sounds normal. She has no wheezes. She has no rales. She exhibits no tenderness.  Abdominal: Soft. Bowel sounds are normal. She exhibits  no distension and no mass. There is no tenderness.  Musculoskeletal: Normal range of motion.  Neurological: She is alert and oriented to person, place, and time.  Skin: Skin is warm and dry.  Psychiatric: She has a normal mood and affect.     Lab Results  Component Value Date   HGBA1C 6.6 (H) 12/09/2017    Assessment & Plan:   1. Type 2 diabetes mellitus without complication, without long-term current use of insulin ([place]) Contolled with A1c of 6.6 Continue current management - POCT glucose (manual entry) - Microalbumin/Creatinine Ratio, Urine - Ambulatory referral to Ophthalmology - Lancets (ONETOUCH ULTRASOFT) lancets; Use as instructed 3 times daily before meals  Dispense: 100 each; Refill: 12 - glucose blood (ONE TOUCH ULTRA TEST) test strip; Use as instructed 3 times daily before meals  Dispense: 100 each; Refill: 12 - Blood Glucose Monitoring Suppl (ONE TOUCH ULTRA 2) w/Device KIT; 1 each by Does not apply route 3 (three) times daily.  Dispense: 1 each; Refill: 0  2. Upper airway cough  syndrome Stable - gabapentin (NEURONTIN) 100 MG capsule; Take 1 capsule (100 mg total) by mouth 3 (three) times daily. One three times daily  Dispense: 90 capsule; Refill: 2  3. Essential hypertension Controlled - isosorbide mononitrate (IMDUR) 60 MG 24 hr tablet; Take 1 tablet (60 mg total) by mouth daily.  Dispense: 90 tablet; Refill: 1 - verapamil (CALAN) 80 MG tablet; Take 1 tablet (80 mg total) by mouth 2 (two) times daily.  Dispense: 180 tablet; Refill: 1  4. Pure hypercholesterolemia Controlled - rosuvastatin (CRESTOR) 5 MG tablet; Take 1 tablet (5 mg total) by mouth daily.  Dispense: 90 tablet; Refill: 1  5. Other headache syndrome Uncontrolled Multifactorial-she does have a PCA aneurysm monitored by interventional radiology and also has underlying allergic rhinitis Will refer to neurology as requested - Ambulatory referral to Neurology  6. Dental anomaly - Ambulatory referral to Dentistry  7. Screening for breast cancer - MM Digital Screening; Future  8. Estrogen deficiency - DG Bone Density; Future   Meds ordered this encounter  Medications  . gabapentin (NEURONTIN) 100 MG capsule    Sig: Take 1 capsule (100 mg total) by mouth 3 (three) times daily. One three times daily    Dispense:  90 capsule    Refill:  2  . isosorbide mononitrate (IMDUR) 60 MG 24 hr tablet    Sig: Take 1 tablet (60 mg total) by mouth daily.    Dispense:  90 tablet    Refill:  1  . verapamil (CALAN) 80 MG tablet    Sig: Take 1 tablet (80 mg total) by mouth 2 (two) times daily.    Dispense:  180 tablet    Refill:  1    Ninety-day supply  . rosuvastatin (CRESTOR) 5 MG tablet    Sig: Take 1 tablet (5 mg total) by mouth daily.    Dispense:  90 tablet    Refill:  1  . Lancets (ONETOUCH ULTRASOFT) lancets    Sig: Use as instructed 3 times daily before meals    Dispense:  100 each    Refill:  12  . glucose blood (ONE TOUCH ULTRA TEST) test strip    Sig: Use as instructed 3 times daily  before meals    Dispense:  100 each    Refill:  12  . Blood Glucose Monitoring Suppl (ONE TOUCH ULTRA 2) w/Device KIT    Sig: 1 each by Does not apply route 3 (three)  times daily.    Dispense:  1 each    Refill:  0    May change for insurance purposes    Follow-up: Return in about 3 months (around 04/13/2018) for Follow-up of chronic medical conditions.   Charlott Rakes MD

## 2018-01-18 ENCOUNTER — Ambulatory Visit (INDEPENDENT_AMBULATORY_CARE_PROVIDER_SITE_OTHER): Payer: BLUE CROSS/BLUE SHIELD | Admitting: Neurology

## 2018-01-18 ENCOUNTER — Encounter: Payer: Self-pay | Admitting: Neurology

## 2018-01-18 VITALS — BP 170/88 | HR 70 | Ht 62.0 in | Wt 150.0 lb

## 2018-01-18 DIAGNOSIS — G4484 Primary exertional headache: Secondary | ICD-10-CM | POA: Diagnosis not present

## 2018-01-18 DIAGNOSIS — G44209 Tension-type headache, unspecified, not intractable: Secondary | ICD-10-CM

## 2018-01-18 DIAGNOSIS — I671 Cerebral aneurysm, nonruptured: Secondary | ICD-10-CM

## 2018-01-18 NOTE — Progress Notes (Signed)
[facility] Clinic Note - Initial Visit   Date: 01/18/18  Paula [name] MRN: [id number] DOB: 23-Jul-1946   Dear Dr. Margarita Rana:  Thank you for your kind referral of [facility] for consultation of headache. Although her history is well known to you, please allow [place] to reiterate it for the purpose of our medical record. The patient was accompanied to the clinic by self.    History of Present Illness: Paula [name] is a 72 y.o. 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    For the past 2 months, she has sharp pain over the parietal region bilaterally, which lasts a few seconds. She denies nausea or vomiting. She also has dull and pounding headache which are longer, lasting 30-60 minutes.  These headaches are worse with coughing and occur about once per week.  Headache usually respond to tylenol within 30-minutes.  She cannot take NSAIDs due to CKD. Recently, she was started on gabapentin 118m qhs which is being titrated to 1029mTID for her severe coughing spells.  She has noticed some improvement with the severity of her headaches.  Regarding her left PCA aneurysm, this is followed by Dr. DeEstanislado Pandynd being monitored every 6 months.    Out-side paper records, electronic medical record, and images have been reviewed where available and summarized as:  Cerebral angiogram 12/16/2017: IMPRESSION. 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  MRI brain wo contrast 09/01/2017:  1. No acute intracranial abnormality. 2. Mild chronic microvascular ischemic disease with small remote  right thalamic lacunar infarct.  Lab Results  Component Value Date   HGBA1C 6.6 (H) 12/09/2017   Lab Results    Component Value Date   TSH 3.54 04/28/2016     Past Medical History:  Diagnosis Date  . Allergic rhinitis   . Arthritis   . Asthma   . Brain aneurysm   . Chronic kidney disease    Stage 3  . Cough   . Diabetes mellitus   . Diastolic CHF, chronic ([place]/09/2011  . GERD (gastroesophageal reflux disease)   . History of colon polyps 2012   tubular adenoma   . Hyperlipidemia   . Hypertension   . Neuropathy 10/11/2011  . PONV (postoperative nausea and vomiting)    one time after lymph node surgery    Past Surgical History:  Procedure Laterality Date  . CATARACT EXTRACTION     right eye  . IR 3D INDEPENDENT WKST  12/14/2017  . IR ANGIO INTRA EXTRACRAN SEL COM CAROTID INNOMINATE BILAT MOD SED  09/15/2017  . IR ANGIO INTRA EXTRACRAN SEL COM CAROTID INNOMINATE UNI L MOD SED  12/14/2017  . IR ANGIO VERTEBRAL SEL VERTEBRAL BILAT MOD SED  09/15/2017  . IR RADIOLOGIST EVAL & MGMT  09/10/2017  . IR RADIOLOGIST EVAL & MGMT  10/19/2017  . lymphatic mass surgery    . lymphoma mass surgery Bilateral    non cancerous  . NASAL TURBINATE REDUCTION    . RADIOLOGY WITH ANESTHESIA N/A 12/14/2017   Procedure: RADIOLOGY WITH ANESTHESIA EMBOLIZATION;  Surgeon: DeLuanne BrasMD;  [facility] Service: Radiology;  Laterality: N/A;  . VIDEO BRONCHOSCOPY Bilateral 11/18/2017   Procedure: VIDEO BRONCHOSCOPY WITHOUT FLUORO;  Surgeon: ByCollene GobbleMD;  Location: [facility];  Service: Cardiopulmonary;  Laterality: Bilateral;     Medications:  Outpatient Encounter Medications as of 01/18/2018  Medication Sig Note  . acetaminophen (TYLENOL) 500 MG tablet Take 500 mg by mouth every 6 (six) hours as needed for headache.   Marland Kitchen aspirin 81 MG tablet Take 81 mg by mouth daily.   12/14/2017: Took 3 tablets  . Blood Glucose Monitoring Suppl (ONE TOUCH ULTRA 2) w/Device KIT 1 each by Does not apply route 3 (three) times daily.   . calcium carbonate (OSCAL) 1500 (600 Ca) MG TABS tablet Take 1,500 mg by mouth  daily with breakfast.   . carvedilol (COREG) 3.125 MG tablet TAKE 1 TABLET BY MOUTH TWICE DAILY WITH A MEAL   . cetirizine (ZYRTEC) 10 MG tablet Take 1 tablet (10 mg total) by mouth daily.   . fluticasone (FLONASE) 50 MCG/ACT nasal spray Place 2 sprays into both nostrils daily.   Marland Kitchen gabapentin (NEURONTIN) 100 MG capsule Take 1 capsule (100 mg total) by mouth 3 (three) times daily. One three times daily   . glipiZIDE (GLUCOTROL) 10 MG tablet TAKE 1 TABLET BY MOUTH TWICE DAILY BEFORE MEAL(S)   . glucose blood (ONE TOUCH ULTRA TEST) test strip Use as instructed 3 times daily before meals   . glucose blood (TRUE METRIX BLOOD GLUCOSE TEST) test strip Use as instructed TID   . isosorbide mononitrate (IMDUR) 60 MG 24 hr tablet Take 1 tablet (60 mg total) by mouth daily.   . Lancets (ONETOUCH ULTRASOFT) lancets Use as instructed 3 times daily before meals   . levocetirizine (XYZAL) 5 MG tablet Take 1 tablet (5 mg total) by mouth every evening. (Patient not taking: Reported on 01/18/2018)   . montelukast (SINGULAIR) 10 MG tablet Take 1 tablet (10 mg total) by mouth at bedtime.   . pantoprazole (PROTONIX) 40 MG tablet Take 1 tablet (40 mg total) by mouth daily.   Vladimir Faster Glycol-Propyl Glycol (SYSTANE) 0.4-0.3 % SOLN Place 1 drop into both eyes 2 (two) times daily as needed (dry eyes).    . rosuvastatin (CRESTOR) 5 MG tablet Take 1 tablet (5 mg total) by mouth daily.   . verapamil (CALAN) 80 MG tablet Take 1 tablet (80 mg total) by mouth 2 (two) times daily.    No facility-administered encounter medications on file as of 01/18/2018.      Allergies:  Allergies  Allergen Reactions  . Dust Mite Extract Cough  . Ace Inhibitors Cough    chronic cough  . Lovastatin Other (See Comments)    Generalized body pain    Family History: Family History  Problem Relation Age of Onset  . Hypertension Mother   . Asthma Sister   . Colon cancer Neg Hx   . Allergic rhinitis Neg Hx   . Eczema Neg Hx      Social History: Social History   Tobacco Use  . Smoking status: Never Smoker  . Smokeless tobacco: Never Used  Substance Use Topics  . Alcohol use: No  . Drug use: No   Social History   Social History Narrative   Hattie lives in a 72 year old apartment with no concern for water damage or mildew.  Flooring is carpet throughout.  Heating is electric and cooling is central.  There are no animals located in the home, however, there is 1 dog across the hallway.  There are no roaches in the home and there are no dust mite free covers on the bed or pillow.  She is exposed to tobacco smoke in  her home.  There are no concerns for exposure to fumes, chemicals, or dust in the home.      Paient is right-handed. She avoids caffeine, she walks daily. She lives alone in a first floor apartment. She had 5 children, one died in childhood, unknown causes, another as a young adult with kidney disease, in [place].    Review of Systems:  CONSTITUTIONAL: No fevers, chills, night sweats, or weight loss.   EYES: No visual changes or eye pain ENT: No hearing changes.  No history of nose bleeds.   RESPIRATORY: No cough, wheezing and shortness of breath.   CARDIOVASCULAR: Negative for chest pain, and palpitations.  + cough GI: Negative for abdominal discomfort, blood in stools or black stools.  No recent change in bowel habits.   GU:  No history of incontinence.   MUSCLOSKELETAL: No history of joint pain or swelling.  No myalgias.   SKIN: Negative for lesions, rash, and itching.   HEMATOLOGY/ONCOLOGY: Negative for prolonged bleeding, bruising easily, and swollen nodes.    ENDOCRINE: Negative for cold or heat intolerance, polydipsia or goiter.   PSYCH:  No depression or anxiety symptoms.   NEURO: As Above.   Vital Signs:  BP (!) 170/88   Pulse 70   Ht 5' 2"  (1.575 m)   Wt 150 lb (68 kg)   LMP  (LMP Unknown)   SpO2 98%   BMI 27.44 kg/m    General Medical Exam:   General:  Well appearing,  comfortable.   Eyes/ENT: see cranial nerve examination.   Neck: No masses appreciated.  No tenderness to palpation over the GON or LON bilaterally.  No carotid bruits. Respiratory:  Clear to auscultation, good air entry bilaterally.   Cardiac:  Regular rate and rhythm, no murmur.   Extremities:  No deformities, edema, or skin discoloration.  Skin:  No rashes or lesions.  Neurological Exam: MENTAL STATUS including orientation to time, place, person, recent and remote memory, attention span and concentration, language, and fund of knowledge is normal.  Speech is not dysarthric.  CRANIAL NERVES: II:  No visual field defects.  Unremarkable fundi.   III-IV-VI: Pupils equal round and reactive to light.  Normal conjugate, extra-ocular eye movements in all directions of gaze.  No nystagmus.  No ptosis.   V:  Normal facial sensation.    VII:  Normal facial symmetry and movements.  VIII:  Normal hearing and vestibular function.   IX-X:  Normal palatal movement.   XI:  Normal shoulder shrug and head rotation.   XII:  Normal tongue strength and range of motion, no deviation or fasciculation.  MOTOR: Motor strength is 5/5 throughout.  No atrophy, fasciculations or abnormal movements.  No pronator drift.  Tone is normal.    MSRs:  Right                                                                 Left brachioradialis 2+  brachioradialis 2+  biceps 2+  biceps 2+  triceps 2+  triceps 2+  patellar 2+  patellar 2+  ankle jerk 2+  ankle jerk 2+  Hoffman no  Hoffman no  plantar response down  plantar response down   SENSORY:  Normal and symmetric perception of light touch, pinprick, vibration,  and proprioception.  Romberg's sign absent.   COORDINATION/GAIT: Normal finger-to- nose-finger.  Intact rapid alternating movements bilaterally.  Gait narrow based and stable.    IMPRESSION: Episodic secondary headache, exertional, due to cerebral aneurysm (left PCA) occurring ~ once per week.  Headache  is responsive to tylenol which she can continue.  Given that her headaches are less than twice per week, no need for preventative medication.  Of note, she is already taking gabapentin 1101m BID for chronic cough, which is written by ENT and this can certainly help with the severity of her headaches as well.  Warning signs discussed if she develops severe headache and to go to the ER.  Thank you for allowing me to participate in patient's care.  If I can answer any additional questions, I would be pleased to do so.    Sincerely,    Donika K. PPosey Pronto DO

## 2018-01-18 NOTE — Patient Instructions (Addendum)
Continue to treat your headache with tylenol.  Limit to twice per week  Return to clinic as needed

## 2018-01-19 ENCOUNTER — Other Ambulatory Visit (INDEPENDENT_AMBULATORY_CARE_PROVIDER_SITE_OTHER): Payer: Self-pay | Admitting: Otolaryngology

## 2018-01-19 DIAGNOSIS — Q313 Laryngocele: Secondary | ICD-10-CM

## 2018-01-26 MED FILL — TRUE METRIX TEST STRIP: 30 days supply | Qty: 100 | Fill #2

## 2018-01-28 ENCOUNTER — Ambulatory Visit
Admission: RE | Admit: 2018-01-28 | Discharge: 2018-01-28 | Disposition: A | Discharge status: Home / Self Care | Payer: BLUE CROSS/BLUE SHIELD | Source: Ambulatory Visit | Attending: Otolaryngology | Admitting: Otolaryngology

## 2018-01-28 DIAGNOSIS — Q313 Laryngocele: Secondary | ICD-10-CM

## 2018-01-28 MED ORDER — IOHEXOL 300 MG/ML  SOLN
50.0000 mL | Freq: Once | INTRAMUSCULAR | Status: AC | PRN
  Administered 2018-01-28: 50 mL via INTRAVENOUS

## 2018-01-29 ENCOUNTER — Telehealth: Payer: Self-pay | Admitting: Emergency Medicine

## 2018-01-29 MED ORDER — FAMOTIDINE 20 MG PO TABS: 20.0000 mg | ORAL_TABLET | Freq: Two times a day (BID) | ORAL | 1 refills | Status: DC

## 2018-01-29 NOTE — Telephone Encounter (Signed)
She can try starting Pepcid / famotidine 20mg  bid. Should be able to get this OTC. We can write generic script if she would prefer.

## 2018-01-29 NOTE — Telephone Encounter (Signed)
Pt is calling back requesting medication. Cb is [phone].

## 2018-01-29 NOTE — Telephone Encounter (Signed)
Pt is aware of below message and voiced her understanding.  Pt requested that Rx for Pepcid be sent in. Rx has been sent in to preferred pharmacy. Nothing further is needed.

## 2018-01-29 NOTE — Telephone Encounter (Signed)
Pt is aware that we are currently awaiting response from [facility]. Advised pt that our office would contact her with a response.

## 2018-01-29 NOTE — Telephone Encounter (Signed)
Called and spoke with pt who states she was taken off of the pantoprazole due to her kidneys.  Due to pt's cough, she is wanting to know an alternative med to pantoprazole that she can take.  Dr. Lamonte Sakai, please advise on this for pt. Thanks!

## 2018-01-30 ENCOUNTER — Emergency Department (HOSPITAL_COMMUNITY)
Admission: EM | Admit: 2018-01-30 | Discharge: 2018-01-30 | Disposition: A | Discharge status: Home / Self Care | Payer: BLUE CROSS/BLUE SHIELD | Attending: Emergency Medicine | Admitting: Emergency Medicine

## 2018-01-30 ENCOUNTER — Encounter (HOSPITAL_COMMUNITY): Payer: Self-pay

## 2018-01-30 ENCOUNTER — Other Ambulatory Visit: Payer: Self-pay

## 2018-01-30 DIAGNOSIS — N183 Chronic kidney disease, stage 3 (moderate): Secondary | ICD-10-CM | POA: Insufficient documentation

## 2018-01-30 DIAGNOSIS — K111 Hypertrophy of salivary gland: Secondary | ICD-10-CM

## 2018-01-30 DIAGNOSIS — Z7982 Long term (current) use of aspirin: Secondary | ICD-10-CM | POA: Insufficient documentation

## 2018-01-30 DIAGNOSIS — K118 Other diseases of salivary glands: Secondary | ICD-10-CM | POA: Diagnosis not present

## 2018-01-30 DIAGNOSIS — I130 Hypertensive heart and chronic kidney disease with heart failure and stage 1 through stage 4 chronic kidney disease, or unspecified chronic kidney disease: Secondary | ICD-10-CM | POA: Insufficient documentation

## 2018-01-30 DIAGNOSIS — J45909 Unspecified asthma, uncomplicated: Secondary | ICD-10-CM | POA: Diagnosis not present

## 2018-01-30 DIAGNOSIS — I5032 Chronic diastolic (congestive) heart failure: Secondary | ICD-10-CM | POA: Insufficient documentation

## 2018-01-30 DIAGNOSIS — Z79899 Other long term (current) drug therapy: Secondary | ICD-10-CM | POA: Insufficient documentation

## 2018-01-30 DIAGNOSIS — Z7984 Long term (current) use of oral hypoglycemic drugs: Secondary | ICD-10-CM | POA: Diagnosis not present

## 2018-01-30 DIAGNOSIS — E1122 Type 2 diabetes mellitus with diabetic chronic kidney disease: Secondary | ICD-10-CM | POA: Insufficient documentation

## 2018-01-30 DIAGNOSIS — E785 Hyperlipidemia, unspecified: Secondary | ICD-10-CM | POA: Diagnosis not present

## 2018-01-30 DIAGNOSIS — R221 Localized swelling, mass and lump, neck: Secondary | ICD-10-CM | POA: Diagnosis present

## 2018-01-30 NOTE — ED Provider Notes (Signed)
[facility] DEPT Provider Note   CSN: [id number] Arrival date & time: 01/30/18  1107     History   Chief Complaint Chief Complaint  Patient presents with  . Neck Pain  . neck swelling    HPI Paula [name] is a 72 y.o. female.  HPI Patient reports that she had a CT scan done for swelling about her neck.  She reports that after she had the scan she thought that swelling increased yesterday.  She reports today this has gone down again.  She was concerned for the enlargement of either lymph nodes or glands.  She reports it was painful when she was swallowing.  No earache, no nasal congestion or drainage. Past Medical History:  Diagnosis Date  . Allergic rhinitis   . Arthritis   . Asthma   . Brain aneurysm   . Chronic kidney disease    Stage 3  . Cough   . Diabetes mellitus   . Diastolic CHF, chronic ([facility]) 10/11/2011  . GERD (gastroesophageal reflux disease)   . History of colon polyps 2012   tubular adenoma   . Hyperlipidemia   . Hypertension   . Neuropathy 10/11/2011  . PONV (postoperative nausea and vomiting)    one time after lymph node surgery    Patient Active Problem List   Diagnosis Date Noted  . Upper airway cough syndrome 10/12/2017  . Chronic rhinitis 10/01/2017  . Seasonal allergic conjunctivitis 10/01/2017  . Aneurysm ([facility]) 09/24/2017  . Acute metabolic encephalopathy 45/10/8880  . URI (upper respiratory infection) 09/02/2017  . Facial weakness 09/02/2017  . Benign essential HTN 09/02/2017  . Allergic rhinitis 08/20/2017  . Chronic pain 05/07/2017  . Palpitations 07/10/2016  . Insomnia 03/25/2016  . Gout 01/23/2016  . History of cataract 02/27/2015  . Seasonal and perennial allergic rhinitis 02/15/2012  . Cough 11/04/2011  . Diabetic neuropathy ([facility]) 10/11/2011  . Chronic diastolic congestive heart failure ([facility]) 10/11/2011  . CKD (chronic kidney disease) stage 3, GFR 30-59 ml/min ([facility]) 10/10/2011  . GERD  (gastroesophageal reflux disease)   . Type 2 diabetes mellitus without complication, without long-term current use of insulin ([facility]) 07/03/2010  . Hyperlipidemia 07/03/2010  . Essential hypertension 07/03/2010    Past Surgical History:  Procedure Laterality Date  . CATARACT EXTRACTION     right eye  . IR 3D INDEPENDENT WKST  12/14/2017  . IR ANGIO INTRA EXTRACRAN SEL COM CAROTID INNOMINATE BILAT MOD SED  09/15/2017  . IR ANGIO INTRA EXTRACRAN SEL COM CAROTID INNOMINATE UNI L MOD SED  12/14/2017  . IR ANGIO VERTEBRAL SEL VERTEBRAL BILAT MOD SED  09/15/2017  . IR RADIOLOGIST EVAL & MGMT  09/10/2017  . IR RADIOLOGIST EVAL & MGMT  10/19/2017  . lymphatic mass surgery    . lymphoma mass surgery Bilateral    non cancerous  . NASAL TURBINATE REDUCTION    . RADIOLOGY WITH ANESTHESIA N/A 12/14/2017   Procedure: RADIOLOGY WITH ANESTHESIA EMBOLIZATION;  Surgeon: Luanne Bras, MD;  Location: [facility];  Service: Radiology;  Laterality: N/A;  . VIDEO BRONCHOSCOPY Bilateral 11/18/2017   Procedure: VIDEO BRONCHOSCOPY WITHOUT FLUORO;  Surgeon: Collene Gobble, MD;  Location: [facility];  Service: Cardiopulmonary;  Laterality: Bilateral;     OB History    Gravida  5   Para  4   Term  4   Preterm      AB  1   Living  4     SAB  TAB  1   Ectopic      Multiple      Live Births               Home Medications    Prior to Admission medications   Medication Sig Start Date End Date Taking? Authorizing Provider  aspirin 81 MG tablet Take 81 mg by mouth daily.     Yes [provider]  carvedilol (COREG) 3.125 MG tablet TAKE 1 TABLET BY MOUTH TWICE DAILY WITH A MEAL 12/21/17  Yes Newlin, Enobong, MD  fluticasone (FLONASE) 50 MCG/ACT nasal spray Place 2 sprays into both nostrils daily. 12/22/17  Yes Collene Gobble, MD  gabapentin (NEURONTIN) 100 MG capsule Take 1 capsule (100 mg total) by mouth 3 (three) times daily. One three times daily 01/11/18  Yes Newlin, Enobong, MD    glipiZIDE (GLUCOTROL) 10 MG tablet TAKE 1 TABLET BY MOUTH TWICE DAILY BEFORE MEAL(S) 12/25/17  Yes Newlin, Enobong, MD  isosorbide mononitrate (IMDUR) 60 MG 24 hr tablet Take 1 tablet (60 mg total) by mouth daily. 01/11/18  Yes Charlott Rakes, MD  rosuvastatin (CRESTOR) 5 MG tablet Take 1 tablet (5 mg total) by mouth daily. 01/11/18  Yes Charlott Rakes, MD  verapamil (CALAN) 80 MG tablet Take 1 tablet (80 mg total) by mouth 2 (two) times daily. 01/11/18  Yes Charlott Rakes, MD  acetaminophen (TYLENOL) 500 MG tablet Take 500 mg by mouth every 6 (six) hours as needed for headache.    [provider]  Blood Glucose Monitoring Suppl (ONE TOUCH ULTRA 2) w/Device KIT 1 each by Does not apply route 3 (three) times daily. 01/11/18   Charlott Rakes, MD  calcium carbonate (OSCAL) 1500 (600 Ca) MG TABS tablet Take 1,500 mg by mouth daily with breakfast.    [provider]  cetirizine (ZYRTEC) 10 MG tablet Take 1 tablet (10 mg total) by mouth daily. Patient not taking: Reported on 01/30/2018 08/20/17   Charlott Rakes, MD  famotidine (PEPCID) 20 MG tablet Take 1 tablet (20 mg total) by mouth 2 (two) times daily. 01/29/18   Collene Gobble, MD  glucose blood (ONE TOUCH ULTRA TEST) test strip Use as instructed 3 times daily before meals 01/11/18   Charlott Rakes, MD  glucose blood (TRUE METRIX BLOOD GLUCOSE TEST) test strip Use as instructed TID 10/09/17   Charlott Rakes, MD  Lancets (ONETOUCH ULTRASOFT) lancets Use as instructed 3 times daily before meals 01/11/18   Charlott Rakes, MD  levocetirizine (XYZAL) 5 MG tablet Take 1 tablet (5 mg total) by mouth every evening. Patient not taking: Reported on 01/18/2018 10/01/17   Dara Hoyer, FNP  montelukast (SINGULAIR) 10 MG tablet Take 1 tablet (10 mg total) by mouth at bedtime. Patient not taking: Reported on 01/30/2018 12/22/17   Collene Gobble, MD  pantoprazole (PROTONIX) 40 MG tablet Take 1 tablet (40 mg total) by mouth daily. Patient not taking:  Reported on 01/30/2018 12/22/17   Collene Gobble, MD  Polyethyl Glycol-Propyl Glycol (SYSTANE) 0.4-0.3 % SOLN Place 1 drop into both eyes 2 (two) times daily as needed (dry eyes).     [provider]    Family History Family History  Problem Relation Age of Onset  . Hypertension Mother   . Asthma Sister   . Colon cancer Neg Hx   . Allergic rhinitis Neg Hx   . Eczema Neg Hx     Social History Social History   Tobacco Use  . Smoking status:  Never Smoker  . Smokeless tobacco: Never Used  Substance Use Topics  . Alcohol use: No  . Drug use: No     Allergies   Dust mite extract; Ace inhibitors; and Lovastatin   Review of Systems Review of Systems 10 Systems reviewed and are negative for acute change except as noted in the HPI.   Physical Exam Updated Vital Signs BP 132/80 (BP Location: Right Arm)   Pulse 65   Temp 98 F (36.7 C) (Oral)   Resp 18   Ht 5' 2"  (1.575 m)   Wt 68 kg (150 lb)   LMP  (LMP Unknown)   SpO2 96%   BMI 27.44 kg/m   Physical Exam  Constitutional: She is oriented to person, place, and time. She appears well-developed and well-nourished. No distress.  HENT:  Oral cavity is normal.  Posterior oropharynx is widely patent.  Mucous membranes are moist and without lesions.  Dentition is in very good condition.  No apparent lesions on the tongue.  Bilateral TMs normal.  Eyes: Pupils are equal, round, and reactive to light. EOM are normal.  Neck:  Neck is supple.  Patient does have slightly prominent and palpable submandibular salivary glands.  No significant lymphadenopathy.  No stridor.  Cardiovascular: Normal rate, regular rhythm, normal heart sounds and intact distal pulses.  Pulmonary/Chest: Effort normal and breath sounds normal.  Musculoskeletal: Normal range of motion.  Neurological: She is alert and oriented to person, place, and time. No cranial nerve deficit. She exhibits normal muscle tone. Coordination normal.  Skin: Skin is warm  and dry.  Psychiatric: She has a normal mood and affect.     ED Treatments / Results  Labs (all labs ordered are listed, but only abnormal results are displayed) Labs Reviewed - No data to display  EKG None  Radiology Ct Soft Tissue Neck W Contrast  Result Date: 01/28/2018 CLINICAL DATA:  72 year old female with progressed chronic cough and frequent choking sensation. Query laryngocele. Creatinine was obtained on site at [facility] at [address] Results: Creatinine 1.8 mg/dL. EXAM: CT NECK WITH CONTRAST TECHNIQUE: Multidetector CT imaging of the neck was performed using the standard protocol following the bolus administration of intravenous contrast. CONTRAST:  51m OMNIPAQUE IOHEXOL 300 MG/ML SOLN (reduced dose due to renal insufficiency) COMPARISON:  Cerebral angiogram 12/14/2017. Brain MRI 09/01/2017, intracranial MRA 09/02/2017, paranasal sinus CT 01/13/2011. The FINDINGS: Pharynx and larynx: The glottis is closed. The remaining laryngeal contours are normal. Negative for laryngocele. The paralaryngeal spaces appear normal. The hypopharynx, oropharynx, and nasopharynx contours appear normal. Negative parapharyngeal and retropharyngeal spaces. Salivary glands: Negative sublingual space. A skin marker is located along the right anterior neck corresponding to the right submandibular space on series 3, image 60 and sagittal image 34. Subjacent lower pole of the right submandibular gland appears normal. The submandibular glands appear symmetric and within normal limits. No sialolithiasis. No regional soft tissue abnormality; there is a prominent asymmetric right facial vein incidentally noted near the marker. The parotid glands and parotid spaces appear symmetric and normal. Thyroid: Normal. Lymph nodes: No cervical lymphadenopathy. The bilateral neck lymph nodes are symmetric and normal. Vascular: Suboptimal intravascular contrast due to the reduced contrast dose. Grossly patent  major vascular structures in the neck and at the skull base. Limited intracranial: ICA siphon Calcified atherosclerosis. Negative visible brain parenchyma. Visualized orbits: Negative. Mastoids and visualized paranasal sinuses: Mild sinus periosteal thickening, but otherwise clear. Bilateral tympanic cavities and mastoids are clear. Skeleton: No acute  osseous abnormality identified. Mild for age cervical spine degeneration. Upper chest: Negative trachea and carina. Minimal subpleural scarring in the visible lungs. Calcified aortic atherosclerosis. No superior mediastinal mass or lymphadenopathy. IMPRESSION: 1. Negative for laryngocele.  Normal CT appearance of the neck. 2. No abnormality identified to correspond to a right submandibular space marked area of clinical concern. Electronically Signed   By: Genevie Ann M.D.   On: 01/28/2018 16:27    Procedures Procedures (including critical care time)  Medications Ordered in ED Medications - No data to display   Initial Impression / Assessment and Plan / ED Course  I have reviewed the triage vital signs and the nursing notes.  Pertinent labs & imaging results that were available during my care of the patient were reviewed by me and considered in my medical decision making (see chart for details).     Final Clinical Impressions(s) / ED Diagnoses   Final diagnoses:  Salivary gland enlargement   Patient is clinically well in appearance.  She perceived enlargement of swelling in her neck yesterday.  She had gotten a CT of the next day before.  She reports she had dysphasia yesterday.  Symptoms are improved today.  Review of CT shows no abnormalities of the salivary glands or lymph nodes.  On physical exam, patient has slightly prominent submandibular salivary glands but no prominent lymphadenopathy or other soft tissue anomaly.  Patient has scheduled follow-up with ENT.'s time I feel she is stable to continue home observation.  The remainder of her head and  neck exam were normal.  Return precautions reviewed. ED Discharge Orders    None       Charlesetta Shanks, MD 01/30/18 1234

## 2018-01-30 NOTE — ED Notes (Signed)
Pt is A & O x4.  She understood AVS instructions.

## 2018-01-30 NOTE — Discharge Instructions (Addendum)
1.  The CT scan done 2 days ago did not show any abnormalities in the neck.  You have slightly prominent salivary glands but at this time that does not appear to be abnormal.  Follow-up with Dr. Benjamine Mola as planned. 2.  Return to the emergency department if you develop any difficulty swallowing or breathing.

## 2018-01-30 NOTE — ED Triage Notes (Signed)
Patient c/o neck swelling and pain that began yesterday. Patient states she has pain when she swallows.

## 2018-03-09 MED FILL — TRUE METRIX TEST STRIP: 30 days supply | Qty: 100 | Fill #3

## 2018-03-11 ENCOUNTER — Other Ambulatory Visit: Payer: Self-pay | Admitting: Family Medicine

## 2018-03-11 DIAGNOSIS — N644 Mastodynia: Secondary | ICD-10-CM

## 2018-03-17 ENCOUNTER — Ambulatory Visit (INDEPENDENT_AMBULATORY_CARE_PROVIDER_SITE_OTHER): Payer: BLUE CROSS/BLUE SHIELD

## 2018-03-17 ENCOUNTER — Ambulatory Visit: Payer: BLUE CROSS/BLUE SHIELD | Admitting: Podiatry

## 2018-03-17 ENCOUNTER — Other Ambulatory Visit: Payer: Self-pay

## 2018-03-17 DIAGNOSIS — M7752 Other enthesopathy of left foot: Secondary | ICD-10-CM

## 2018-03-17 DIAGNOSIS — M778 Other enthesopathies, not elsewhere classified: Secondary | ICD-10-CM

## 2018-03-17 DIAGNOSIS — M10072 Idiopathic gout, left ankle and foot: Secondary | ICD-10-CM | POA: Diagnosis not present

## 2018-03-17 DIAGNOSIS — M659 Synovitis and tenosynovitis, unspecified: Secondary | ICD-10-CM

## 2018-03-17 DIAGNOSIS — M779 Enthesopathy, unspecified: Secondary | ICD-10-CM | POA: Diagnosis not present

## 2018-03-18 ENCOUNTER — Ambulatory Visit: Payer: BLUE CROSS/BLUE SHIELD

## 2018-03-18 ENCOUNTER — Ambulatory Visit
Admission: RE | Admit: 2018-03-18 | Discharge: 2018-03-18 | Disposition: A | Discharge status: Home / Self Care | Payer: BLUE CROSS/BLUE SHIELD | Source: Ambulatory Visit | Attending: Family Medicine | Admitting: Family Medicine

## 2018-03-18 DIAGNOSIS — E2839 Other primary ovarian failure: Secondary | ICD-10-CM

## 2018-03-18 DIAGNOSIS — N644 Mastodynia: Secondary | ICD-10-CM

## 2018-03-19 ENCOUNTER — Telehealth: Payer: Self-pay

## 2018-03-19 NOTE — Telephone Encounter (Signed)
Patient was called and informed to contact office for lab results.   If patient returns phone call please inform patient of results below. 

## 2018-03-19 NOTE — Telephone Encounter (Signed)
-----   Message from Charlott Rakes, MD sent at 03/18/2018  1:37 PM EDT ----- Bone density test is normal

## 2018-03-21 NOTE — Progress Notes (Signed)
   HPI: 72 year old female with PMHx of DM presenting today with a chief complaint of left foot pain that began one week ago. She states the pain started in the middle of the night and woke her from her sleep. She states the pain is worse with walking and decreases her mobility. She has not done anything for treatment. She reports h/o fracture to the 4th or 5th metatarsal 6 months ago that was treated at [facility]. She also admits to eating a lot of shellfish lately. Patient is here for further evaluation and treatment.   Past Medical History:  Diagnosis Date  . Allergic rhinitis   . Arthritis   . Asthma   . Brain aneurysm   . Chronic kidney disease    Stage 3  . Cough   . Diabetes mellitus   . Diastolic CHF, chronic ([place]) 10/11/2011  . GERD (gastroesophageal reflux disease)   . History of colon polyps 2012   tubular adenoma   . Hyperlipidemia   . Hypertension   . Neuropathy 10/11/2011  . PONV (postoperative nausea and vomiting)    one time after lymph node surgery     Physical Exam: General: The patient is alert and oriented x3 in no acute distress.  Dermatology: Skin is warm, dry and supple bilateral lower extremities. Negative for open lesions or macerations.  Vascular: Palpable pedal pulses bilaterally. No edema or erythema noted. Capillary refill within normal limits.  Neurological: Epicritic and protective threshold grossly intact bilaterally.   Musculoskeletal Exam: Pain on palpation to the left ankle and midfoot with erythema and edema. Muscle strength 5/5 in all groups bilateral.   Radiographic Exam:  Normal osseous mineralization. Joint spaces preserved. No fracture/dislocation/boney destruction.    Assessment: 1. Left ankle and midfoot capsulitis secondary to acute gout attack    Plan of Care:  1. Patient evaluated. X-Rays reviewed.  2. Injection of 0.5 mLs Celestone Soluspan injected into the left ankle joint.  3. Injection of 0.5 mLs  Celestone Soluspan injected into the left midfoot.  4. Discussed avoiding shellfish and red meat.  5. Patient has CKD so we will avoid oral NSAIDs.  6. Return to clinic as needed.       Edrick Kins, DPM [facility]  Dr. Edrick Kins, DPM    [address], [place] [address]                Office [phone]  Fax [phone]

## 2018-04-12 ENCOUNTER — Other Ambulatory Visit: Payer: Self-pay | Admitting: Family Medicine

## 2018-04-13 MED FILL — TRUE METRIX TEST STRIP: 30 days supply | Qty: 100 | Fill #4

## 2018-04-15 ENCOUNTER — Encounter: Payer: Self-pay | Admitting: Family Medicine

## 2018-04-15 ENCOUNTER — Ambulatory Visit: Payer: BLUE CROSS/BLUE SHIELD | Attending: Family Medicine | Admitting: Family Medicine

## 2018-04-15 VITALS — BP 147/76 | HR 60 | Temp 98.4°F | Ht 62.0 in | Wt 147.0 lb

## 2018-04-15 DIAGNOSIS — J45909 Unspecified asthma, uncomplicated: Secondary | ICD-10-CM | POA: Insufficient documentation

## 2018-04-15 DIAGNOSIS — I130 Hypertensive heart and chronic kidney disease with heart failure and stage 1 through stage 4 chronic kidney disease, or unspecified chronic kidney disease: Secondary | ICD-10-CM | POA: Insufficient documentation

## 2018-04-15 DIAGNOSIS — K219 Gastro-esophageal reflux disease without esophagitis: Secondary | ICD-10-CM | POA: Insufficient documentation

## 2018-04-15 DIAGNOSIS — N183 Chronic kidney disease, stage 3 unspecified: Secondary | ICD-10-CM

## 2018-04-15 DIAGNOSIS — E1169 Type 2 diabetes mellitus with other specified complication: Secondary | ICD-10-CM | POA: Diagnosis not present

## 2018-04-15 DIAGNOSIS — E7800 Pure hypercholesterolemia, unspecified: Secondary | ICD-10-CM | POA: Insufficient documentation

## 2018-04-15 DIAGNOSIS — I5032 Chronic diastolic (congestive) heart failure: Secondary | ICD-10-CM | POA: Insufficient documentation

## 2018-04-15 DIAGNOSIS — Z7982 Long term (current) use of aspirin: Secondary | ICD-10-CM | POA: Insufficient documentation

## 2018-04-15 DIAGNOSIS — Z7984 Long term (current) use of oral hypoglycemic drugs: Secondary | ICD-10-CM | POA: Insufficient documentation

## 2018-04-15 DIAGNOSIS — Z79899 Other long term (current) drug therapy: Secondary | ICD-10-CM | POA: Insufficient documentation

## 2018-04-15 DIAGNOSIS — R05 Cough: Secondary | ICD-10-CM | POA: Insufficient documentation

## 2018-04-15 DIAGNOSIS — E1140 Type 2 diabetes mellitus with diabetic neuropathy, unspecified: Secondary | ICD-10-CM | POA: Insufficient documentation

## 2018-04-15 DIAGNOSIS — R51 Headache: Secondary | ICD-10-CM | POA: Insufficient documentation

## 2018-04-15 DIAGNOSIS — I10 Essential (primary) hypertension: Secondary | ICD-10-CM

## 2018-04-15 DIAGNOSIS — I671 Cerebral aneurysm, nonruptured: Secondary | ICD-10-CM | POA: Insufficient documentation

## 2018-04-15 DIAGNOSIS — R058 Other specified cough: Secondary | ICD-10-CM

## 2018-04-15 DIAGNOSIS — E0841 Diabetes mellitus due to underlying condition with diabetic mononeuropathy: Secondary | ICD-10-CM | POA: Diagnosis not present

## 2018-04-15 DIAGNOSIS — E1122 Type 2 diabetes mellitus with diabetic chronic kidney disease: Secondary | ICD-10-CM | POA: Insufficient documentation

## 2018-04-15 DIAGNOSIS — I729 Aneurysm of unspecified site: Secondary | ICD-10-CM

## 2018-04-15 LAB — POCT GLYCOSYLATED HEMOGLOBIN (HGB A1C): HbA1c, POC (controlled diabetic range): 6.7 % (ref 0.0–7.0)

## 2018-04-15 LAB — GLUCOSE, POCT (MANUAL RESULT ENTRY): POC Glucose: 180 mg/dl — AB (ref 70–99)

## 2018-04-15 MED ORDER — GLIPIZIDE 10 MG PO TABS: 10.0000 mg | ORAL_TABLET | Freq: Two times a day (BID) | ORAL | 1 refills | Status: DC

## 2018-04-15 MED ORDER — IPRATROPIUM BROMIDE 0.06 % NA SOLN: NASAL | 2 refills | Status: DC

## 2018-04-15 MED ORDER — CARVEDILOL 3.125 MG PO TABS: ORAL_TABLET | ORAL | 1 refills | Status: DC

## 2018-04-15 NOTE — Progress Notes (Signed)
Subjective:  Patient ID: Paula [name], female    DOB: Dec 31, 1945  Age: 72 y.o. 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 She had a visit with [facility] on 03/24/2018 due to persistent postnasal drainage and cough and as per notes cough suppression therapy recommended given cough not controlled on current regimen. Also seen by Neurology-Dr. Posey Pronto due to headaches in the setting of cerebral aneurysm and no preventive medication recommended given infrequent headaches she is to continue gabapentin. Her aneurysm is followed by Dr. Estanislado Pandy and she has an upcoming appointment later this month.  Paula [name] states she feels "healthy" today and her headaches and cough have been infrequent.  She endorses compliance with a diabetic diet, exercise and denies hypoglycemic episodes.  Her diabetic neuropathy is controlled on gabapentin and she recently saw podiatry where she received a sock which has helped with her balance and gait. Her blood pressure is slightly elevated and she endorses compliance with her antihypertensives. She denies acute concerns today.  Past Medical History:  Diagnosis Date  . Allergic rhinitis   . Arthritis   . Asthma   . Brain aneurysm   . Chronic kidney disease    Stage 3  . Cough   . Diabetes mellitus   . Diastolic CHF, chronic ([place]) 10/11/2011  . GERD (gastroesophageal reflux disease)   . History of colon polyps 2012   tubular adenoma   . Hyperlipidemia   . Hypertension   . Neuropathy 10/11/2011  . PONV (postoperative nausea and vomiting)    one time after lymph node  surgery    Past Surgical History:  Procedure Laterality Date  . CATARACT EXTRACTION     right eye  . IR 3D INDEPENDENT WKST  12/14/2017  . IR ANGIO INTRA EXTRACRAN SEL COM CAROTID INNOMINATE BILAT MOD SED  09/15/2017  . IR ANGIO INTRA EXTRACRAN SEL COM CAROTID INNOMINATE UNI L MOD SED  12/14/2017  . IR ANGIO VERTEBRAL SEL VERTEBRAL BILAT MOD SED  09/15/2017  . IR RADIOLOGIST EVAL & MGMT  09/10/2017  . IR RADIOLOGIST EVAL & MGMT  10/19/2017  . lymphatic mass surgery    . lymphoma mass surgery Bilateral    non cancerous  . NASAL TURBINATE REDUCTION    . RADIOLOGY WITH ANESTHESIA N/A 12/14/2017   Procedure: RADIOLOGY WITH ANESTHESIA EMBOLIZATION;  Surgeon: Luanne Bras, MD;  Location: [place];  Service: Radiology;  Laterality: N/A;  . VIDEO BRONCHOSCOPY Bilateral 11/18/2017   Procedure: VIDEO BRONCHOSCOPY WITHOUT FLUORO;  Surgeon: Collene Gobble, MD;  Location: [facility];  Service: Cardiopulmonary;  Laterality: Bilateral;    Allergies  Allergen Reactions  . Dust Mite Extract Cough  . Ace Inhibitors Cough    chronic cough  . Lovastatin Other (See Comments)    Generalized body pain     Outpatient Medications Prior to Visit  Medication Sig Dispense Refill  . acetaminophen (TYLENOL) 500 MG tablet Take 500 mg by mouth every 6 (six) hours as needed for headache.    Marland Kitchen aspirin 81 MG tablet Take 81 mg by mouth daily.      . Blood Glucose Monitoring Suppl (ONE TOUCH ULTRA 2)  w/Device KIT 1 each by Does not apply route 3 (three) times daily. 1 each 0  . calcium carbonate (OSCAL) 1500 (600 Ca) MG TABS tablet Take 1,500 mg by mouth daily with breakfast.    . cetirizine (ZYRTEC) 10 MG tablet Take 1 tablet (10 mg total) by mouth daily. 90 tablet 1  . famotidine (PEPCID) 20 MG tablet Take 1 tablet (20 mg total) by mouth 2 (two) times daily. 60 tablet 1  . fluticasone (FLONASE) 50 MCG/ACT nasal spray Place 2 sprays into both nostrils daily. 16 g 5  . gabapentin (NEURONTIN) 100 MG capsule Take 1  capsule (100 mg total) by mouth 3 (three) times daily. One three times daily 90 capsule 2  . glucose blood (ONE TOUCH ULTRA TEST) test strip Use as instructed 3 times daily before meals 100 each 12  . glucose blood (TRUE METRIX BLOOD GLUCOSE TEST) test strip Use as instructed TID 100 each 12  . isosorbide mononitrate (IMDUR) 60 MG 24 hr tablet Take 1 tablet (60 mg total) by mouth daily. 90 tablet 1  . Lancets (ONETOUCH ULTRASOFT) lancets Use as instructed 3 times daily before meals 100 each 12  . Polyethyl Glycol-Propyl Glycol (SYSTANE) 0.4-0.3 % SOLN Place 1 drop into both eyes 2 (two) times daily as needed (dry eyes).     . rosuvastatin (CRESTOR) 5 MG tablet Take 1 tablet (5 mg total) by mouth daily. 90 tablet 1  . verapamil (CALAN) 80 MG tablet Take 1 tablet (80 mg total) by mouth 2 (two) times daily. 180 tablet 1  . carvedilol (COREG) 3.125 MG tablet TAKE 1 TABLET BY MOUTH TWICE DAILY WITH A MEAL 60 tablet 3  . glipiZIDE (GLUCOTROL) 10 MG tablet TAKE 1 TABLET BY MOUTH TWICE DAILY BEFORE MEAL(S) 90 tablet 0  . ipratropium (ATROVENT) 0.06 % nasal spray PLACE 2 SPRAY(S) IN EACH NOSTRIL TWICE DAILY AS NEEDED FOR DRAINAGE  5  . ipratropium (ATROVENT) 0.06 % nasal spray PLACE 2 SPRAY(S) IN EACH NOSTRIL TWICE DAILY AS NEEDED FOR DRAINAGE    . levocetirizine (XYZAL) 5 MG tablet Take 1 tablet (5 mg total) by mouth every evening. (Patient not taking: Reported on 01/18/2018) 30 tablet 5  . montelukast (SINGULAIR) 10 MG tablet Take 1 tablet (10 mg total) by mouth at bedtime. (Patient not taking: Reported on 01/30/2018) 30 tablet 5  . pantoprazole (PROTONIX) 40 MG tablet Take 1 tablet (40 mg total) by mouth daily. (Patient not taking: Reported on 01/30/2018) 30 tablet 5   No facility-administered medications prior to visit.     ROS Review of Systems  Constitutional: Negative for activity change, appetite change and fatigue.  HENT: Negative for congestion, sinus pressure and sore throat.   Eyes: Negative  for visual disturbance.  Respiratory: Positive for cough. Negative for chest tightness, shortness of breath and wheezing.   Cardiovascular: Negative for chest pain and palpitations.  Gastrointestinal: Negative for abdominal distention, abdominal pain and constipation.  Endocrine: Negative for polydipsia.  Genitourinary: Negative for dysuria and frequency.  Musculoskeletal: Negative for arthralgias and back pain.  Skin: Negative for rash.  Neurological: Negative for tremors, light-headedness and numbness.  Hematological: Does not bruise/bleed easily.  Psychiatric/Behavioral: Negative for agitation and behavioral problems.    Objective:  BP (!) 147/76   Pulse 60   Temp 98.4 F (36.9 C) (Oral)   Ht 5' 2" (1.575 m)   Wt 147 lb (66.7 kg)   LMP  (LMP Unknown)   SpO2 95%   BMI 26.89  kg/m   BP/Weight 04/15/2018 01/30/2018 1/56/1537  Systolic BP 943 276 147  Diastolic BP 76 93 88  Wt. (Lbs) 147 150 150  BMI 26.89 27.44 27.44     Physical Exam  Constitutional: She is oriented to person, place, and time. She appears well-developed and well-nourished.  Cardiovascular: Normal rate, normal heart sounds and intact distal pulses.  No murmur heard. Pulmonary/Chest: Effort normal and breath sounds normal. She has no wheezes. She has no rales. She exhibits no tenderness.  Abdominal: Soft. Bowel sounds are normal. She exhibits no distension and no mass. There is no tenderness.  Musculoskeletal: Normal range of motion.  Neurological: She is alert and oriented to person, place, and time.  Skin: Skin is warm and dry.  Psychiatric: She has a normal mood and affect.      CMP Latest Ref Rng & Units 12/09/2017 09/24/2017 09/15/2017  Glucose 65 - 99 mg/dL 144(H) 181(H) 175(H)  BUN 6 - 20 mg/dL _0 Creatinine 0.44 - 1.00 mg/dL 1.58(H) 1.88(H) 1.74(H)  Sodium 135 - 145 mmol/L 141 137 141  Potassium 3.5 - 5.1 mmol/L 3.9 4.2 3.2(L)  Chloride 101 - 111 mmol/L 99(L) 95(L) 102  CO2 22 - 32 mmol/L _1 Calcium 8.9 - 10.3 mg/dL 9.6 9.8 9.2  Total Protein 6.0 - 8.5 g/dL - 7.1 -  Total Bilirubin 0.0 - 1.2 mg/dL - 0.4 -  Alkaline Phos 39 - 117 IU/L - 79 -  AST 0 - 40 IU/L - 18 -  ALT 0 - 32 IU/L - 21 -    Lipid Panel     Component Value Date/Time   CHOL 145 05/08/2017 0845   TRIG 90 05/08/2017 0845   HDL 46 05/08/2017 0845   CHOLHDL 3.2 05/08/2017 0845   CHOLHDL 2.9 01/25/2016 0901   VLDL 20 01/25/2016 0901   LDLCALC 81 05/08/2017 0845    Lab Results  Component Value Date   HGBA1C 6.7 04/15/2018    Assessment & Plan:   1. Type 2 diabetes mellitus with other specified complication, without long-term current use of insulin (HCC) Controlled with A1c of 6.7 Discussed management of hypoglycemia Counseled on Diabetic diet, my plate method, 092 minutes of moderate intensity exercise/week Keep blood sugar logs with fasting goals of 80-120 mg/dl, random of less than 180 and in the event of sugars less than 60 mg/dl or greater than 400 mg/dl please notify the clinic ASAP. It is recommended that you undergo annual eye exams and annual foot exams. Pneumonia vaccine is recommended. - POCT glucose (manual entry) - POCT glycosylated hemoglobin (Hb A1C) - glipiZIDE (GLUCOTROL) 10 MG tablet; Take 1 tablet (10 mg total) by mouth 2 (two) times daily before a meal.  Dispense: 180 tablet; Refill: 1  2. Essential hypertension Slightly elevated No regimen change today Counseled on blood pressure goal of less than 130/80, low-sodium, DASH diet, medication compliance, 150 minutes of moderate intensity exercise per week. Discussed medication compliance, adverse effects. - carvedilol (COREG) 3.125 MG tablet; TAKE 1 TABLET BY MOUTH TWICE DAILY WITH A MEAL  Dispense: 180 tablet; Refill: 1  3. Diabetic mononeuropathy associated with diabetes mellitus due to underlying condition ([place]) Stable Currently on gabapentin   4. 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 Upper airway cough syndrome Improved with less frequent cough Continue Zyrtec Recently seen by ENT-cough suppression therapy recommended - ipratropium (ATROVENT) 0.06 %  nasal spray; PLACE 2 SPRAY(S) IN EACH NOSTRIL TWICE DAILY AS NEEDED FOR DRAINAGE  Dispense: 15 mL; Refill: 2  6. Aneurysm ([place]) Stable Followed by Dr. Estanislado Pandy   Meds ordered this encounter  Medications  . glipiZIDE (GLUCOTROL) 10 MG tablet    Sig: Take 1 tablet (10 mg total) by mouth 2 (two) times daily before a meal.    Dispense:  180 tablet    Refill:  1  . carvedilol (COREG) 3.125 MG tablet    Sig: TAKE 1 TABLET BY MOUTH TWICE DAILY WITH A MEAL    Dispense:  180 tablet    Refill:  1    Please consider 90 day supplies to promote better adherence  . ipratropium (ATROVENT) 0.06 % nasal spray    Sig: PLACE 2 SPRAY(S) IN EACH NOSTRIL TWICE DAILY AS NEEDED FOR DRAINAGE    Dispense:  15 mL    Refill:  2    Follow-up: Return in about 3 months (around 07/15/2018) for Follow-up of chronic medical conditions.   Charlott Rakes MD

## 2018-04-15 NOTE — Patient Instructions (Signed)
Diabetes Mellitus and Nutrition When you have diabetes (diabetes mellitus), it is very important to have healthy eating habits because your blood sugar (glucose) levels are greatly affected by what you eat and drink. Eating healthy foods in the appropriate amounts, at about the same times every day, can help you:  Control your blood glucose.  Lower your risk of heart disease.  Improve your blood pressure.  Reach or maintain a healthy weight.  Every person with diabetes is different, and each person has different needs for a meal plan. Your health care provider may recommend that you work with a diet and nutrition specialist (dietitian) to make a meal plan that is best for you. Your meal plan may vary depending on factors such as:  The calories you need.  The medicines you take.  Your weight.  Your blood glucose, blood pressure, and cholesterol levels.  Your activity level.  Other health conditions you have, such as heart or kidney disease.  How do carbohydrates affect me? Carbohydrates affect your blood glucose level more than any other type of food. Eating carbohydrates naturally increases the amount of glucose in your blood. Carbohydrate counting is a method for keeping track of how many carbohydrates you eat. Counting carbohydrates is important to keep your blood glucose at a healthy level, especially if you use insulin or take certain oral diabetes medicines. It is important to know how many carbohydrates you can safely have in each meal. This is different for every person. Your dietitian can help you calculate how many carbohydrates you should have at each meal and for snack. Foods that contain carbohydrates include:  Bread, cereal, rice, pasta, and crackers.  Potatoes and corn.  Peas, beans, and lentils.  Milk and yogurt.  Fruit and juice.  Desserts, such as cakes, cookies, ice cream, and candy.  How does alcohol affect me? Alcohol can cause a sudden decrease in blood  glucose (hypoglycemia), especially if you use insulin or take certain oral diabetes medicines. Hypoglycemia can be a life-threatening condition. Symptoms of hypoglycemia (sleepiness, dizziness, and confusion) are similar to symptoms of having too much alcohol. If your health care provider says that alcohol is safe for you, follow these guidelines:  Limit alcohol intake to no more than 1 drink per day for nonpregnant women and 2 drinks per day for men. One drink equals 12 oz of beer, 5 oz of wine, or 1 oz of hard liquor.  Do not drink on an empty stomach.  Keep yourself hydrated with water, diet soda, or unsweetened iced tea.  Keep in mind that regular soda, juice, and other mixers may contain a lot of sugar and must be counted as carbohydrates.  What are tips for following this plan? Reading food labels  Start by checking the serving size on the label. The amount of calories, carbohydrates, fats, and other nutrients listed on the label are based on one serving of the food. Many foods contain more than one serving per package.  Check the total grams (g) of carbohydrates in one serving. You can calculate the number of servings of carbohydrates in one serving by dividing the total carbohydrates by 15. For example, if a food has 30 g of total carbohydrates, it would be equal to 2 servings of carbohydrates.  Check the number of grams (g) of saturated and trans fats in one serving. Choose foods that have low or no amount of these fats.  Check the number of milligrams (mg) of sodium in one serving. Most people   should limit total sodium intake to less than 2,300 mg per day.  Always check the nutrition information of foods labeled as "low-fat" or "nonfat". These foods may be higher in added sugar or refined carbohydrates and should be avoided.  Talk to your dietitian to identify your daily goals for nutrients listed on the label. Shopping  Avoid buying canned, premade, or processed foods. These  foods tend to be high in fat, sodium, and added sugar.  Shop around the outside edge of the grocery store. This includes fresh fruits and vegetables, bulk grains, fresh meats, and fresh dairy. Cooking  Use low-heat cooking methods, such as baking, instead of high-heat cooking methods like deep frying.  Cook using healthy oils, such as olive, canola, or sunflower oil.  Avoid cooking with butter, cream, or high-fat meats. Meal planning  Eat meals and snacks regularly, preferably at the same times every day. Avoid going long periods of time without eating.  Eat foods high in fiber, such as fresh fruits, vegetables, beans, and whole grains. Talk to your dietitian about how many servings of carbohydrates you can eat at each meal.  Eat 4-6 ounces of lean protein each day, such as lean meat, chicken, fish, eggs, or tofu. 1 ounce is equal to 1 ounce of meat, chicken, or fish, 1 egg, or 1/4 cup of tofu.  Eat some foods each day that contain healthy fats, such as avocado, nuts, seeds, and fish. Lifestyle   Check your blood glucose regularly.  Exercise at least 30 minutes 5 or more days each week, or as told by your health care provider.  Take medicines as told by your health care provider.  Do not use any products that contain nicotine or tobacco, such as cigarettes and e-cigarettes. If you need help quitting, ask your health care provider.  Work with a counselor or diabetes educator to identify strategies to manage stress and any emotional and social challenges. What are some questions to ask my health care provider?  Do I need to meet with a diabetes educator?  Do I need to meet with a dietitian?  What number can I call if I have questions?  When are the best times to check my blood glucose? Where to find more information:  American Diabetes Association: diabetes.org/food-and-fitness/food  Academy of Nutrition and Dietetics:  www.eatright.org/resources/health/diseases-and-conditions/diabetes  National Institute of Diabetes and Digestive and Kidney Diseases (NIH): www.niddk.nih.gov/health-information/diabetes/overview/diet-eating-physical-activity Summary  A healthy meal plan will help you control your blood glucose and maintain a healthy lifestyle.  Working with a diet and nutrition specialist (dietitian) can help you make a meal plan that is best for you.  Keep in mind that carbohydrates and alcohol have immediate effects on your blood glucose levels. It is important to count carbohydrates and to use alcohol carefully. This information is not intended to replace advice given to you by your health care provider. Make sure you discuss any questions you have with your health care provider. Document Released: 04/24/2005 Document Revised: 09/01/2016 Document Reviewed: 09/01/2016 Elsevier Interactive Patient Education  2018 Elsevier Inc.  

## 2018-04-16 ENCOUNTER — Other Ambulatory Visit: Payer: Self-pay | Admitting: Family Medicine

## 2018-04-24 ENCOUNTER — Emergency Department (HOSPITAL_COMMUNITY): Payer: BLUE CROSS/BLUE SHIELD

## 2018-04-24 ENCOUNTER — Encounter (HOSPITAL_COMMUNITY): Payer: Self-pay

## 2018-04-24 ENCOUNTER — Emergency Department (HOSPITAL_COMMUNITY)
Admission: EM | Admit: 2018-04-24 | Discharge: 2018-04-24 | Disposition: A | Discharge status: Home / Self Care | Payer: BLUE CROSS/BLUE SHIELD | Attending: Emergency Medicine | Admitting: Emergency Medicine

## 2018-04-24 ENCOUNTER — Other Ambulatory Visit: Payer: Self-pay

## 2018-04-24 DIAGNOSIS — Z7984 Long term (current) use of oral hypoglycemic drugs: Secondary | ICD-10-CM | POA: Insufficient documentation

## 2018-04-24 DIAGNOSIS — Y999 Unspecified external cause status: Secondary | ICD-10-CM | POA: Insufficient documentation

## 2018-04-24 DIAGNOSIS — M546 Pain in thoracic spine: Secondary | ICD-10-CM | POA: Insufficient documentation

## 2018-04-24 DIAGNOSIS — W1830XA Fall on same level, unspecified, initial encounter: Secondary | ICD-10-CM | POA: Diagnosis not present

## 2018-04-24 DIAGNOSIS — Y92009 Unspecified place in unspecified non-institutional (private) residence as the place of occurrence of the external cause: Secondary | ICD-10-CM | POA: Insufficient documentation

## 2018-04-24 DIAGNOSIS — I130 Hypertensive heart and chronic kidney disease with heart failure and stage 1 through stage 4 chronic kidney disease, or unspecified chronic kidney disease: Secondary | ICD-10-CM | POA: Insufficient documentation

## 2018-04-24 DIAGNOSIS — M25561 Pain in right knee: Secondary | ICD-10-CM | POA: Insufficient documentation

## 2018-04-24 DIAGNOSIS — I5032 Chronic diastolic (congestive) heart failure: Secondary | ICD-10-CM | POA: Insufficient documentation

## 2018-04-24 DIAGNOSIS — N183 Chronic kidney disease, stage 3 (moderate): Secondary | ICD-10-CM | POA: Diagnosis not present

## 2018-04-24 DIAGNOSIS — W19XXXA Unspecified fall, initial encounter: Secondary | ICD-10-CM

## 2018-04-24 DIAGNOSIS — E1122 Type 2 diabetes mellitus with diabetic chronic kidney disease: Secondary | ICD-10-CM | POA: Diagnosis not present

## 2018-04-24 DIAGNOSIS — T07XXXA Unspecified multiple injuries, initial encounter: Secondary | ICD-10-CM | POA: Insufficient documentation

## 2018-04-24 DIAGNOSIS — Z79899 Other long term (current) drug therapy: Secondary | ICD-10-CM | POA: Insufficient documentation

## 2018-04-24 DIAGNOSIS — Y9389 Activity, other specified: Secondary | ICD-10-CM | POA: Insufficient documentation

## 2018-04-24 NOTE — Discharge Instructions (Addendum)
There were no abnormal findings on the x-ray images.  You likely bruised your right knee and strained your back and hip area when he fell.  To treat the discomfort take Tylenol every 4 hours.  You can also use heat on the sore area 3 or 4 times a day.

## 2018-04-24 NOTE — ED Triage Notes (Signed)
She states she tripped and fell yesterday while going to get her mail. She c/o left hip and bilat. Elbow pain. She is alert and in no distress.

## 2018-04-24 NOTE — ED Provider Notes (Signed)
[facility] DEPT Provider Note   CSN: [id number] Arrival date & time: 04/24/18  6546     History   Chief Complaint Chief Complaint  Patient presents with  . Fall    HPI Paula [name] is a 72 y.o. female.  HPI   She presents for evaluation of injuries to back, and right knee, when she fell yesterday while getting the mail.  Since then she has been ambulatory but complains of pain in the middle of her back in her right knee.  She denies headache, neck pain, focal weakness or paresthesia.  There are no other known modifying factors.  Past Medical History:  Diagnosis Date  . Allergic rhinitis   . Arthritis   . Asthma   . Brain aneurysm   . Chronic kidney disease    Stage 3  . Cough   . Diabetes mellitus   . Diastolic CHF, chronic ([place]) 10/11/2011  . GERD (gastroesophageal reflux disease)   . History of colon polyps 2012   tubular adenoma   . Hyperlipidemia   . Hypertension   . Neuropathy 10/11/2011  . PONV (postoperative nausea and vomiting)    one time after lymph node surgery    Patient Active Problem List   Diagnosis Date Noted  . Upper airway cough syndrome 10/12/2017  . Chronic rhinitis 10/01/2017  . Seasonal allergic conjunctivitis 10/01/2017  . Aneurysm ([place]) 09/24/2017  . Acute metabolic encephalopathy 50/35/4656  . URI (upper respiratory infection) 09/02/2017  . Facial weakness 09/02/2017  . Benign essential HTN 09/02/2017  . Allergic rhinitis 08/20/2017  . Chronic pain 05/07/2017  . Palpitations 07/10/2016  . Insomnia 03/25/2016  . Gout 01/23/2016  . History of cataract 02/27/2015  . Seasonal and perennial allergic rhinitis 02/15/2012  . Cough 11/04/2011  . Diabetic neuropathy ([place]) 10/11/2011  . Chronic diastolic congestive heart failure ([place]) 10/11/2011  . CKD (chronic kidney disease) stage 3, GFR 30-59 ml/min ([facility]) 10/10/2011  . GERD (gastroesophageal reflux disease)   . Type 2 diabetes mellitus without  complication, without long-term current use of insulin ([place]) 07/03/2010  . Hyperlipidemia 07/03/2010  . Essential hypertension 07/03/2010    Past Surgical History:  Procedure Laterality Date  . CATARACT EXTRACTION     right eye  . IR 3D INDEPENDENT WKST  12/14/2017  . IR ANGIO INTRA EXTRACRAN SEL COM CAROTID INNOMINATE BILAT MOD SED  09/15/2017  . IR ANGIO INTRA EXTRACRAN SEL COM CAROTID INNOMINATE UNI L MOD SED  12/14/2017  . IR ANGIO VERTEBRAL SEL VERTEBRAL BILAT MOD SED  09/15/2017  . IR RADIOLOGIST EVAL & MGMT  09/10/2017  . IR RADIOLOGIST EVAL & MGMT  10/19/2017  . lymphatic mass surgery    . lymphoma mass surgery Bilateral    non cancerous  . NASAL TURBINATE REDUCTION    . RADIOLOGY WITH ANESTHESIA N/A 12/14/2017   Procedure: RADIOLOGY WITH ANESTHESIA EMBOLIZATION;  Surgeon: Luanne Bras, MD;  Location: [facility];  Service: Radiology;  Laterality: N/A;  . VIDEO BRONCHOSCOPY Bilateral 11/18/2017   Procedure: VIDEO BRONCHOSCOPY WITHOUT FLUORO;  Surgeon: Collene Gobble, MD;  Location: [facility];  Service: Cardiopulmonary;  Laterality: Bilateral;     OB History    Gravida  5   Para  4   Term  4   Preterm      AB  1   Living  4     SAB      TAB  1   Ectopic      Multiple  Live Births               Home Medications    Prior to Admission medications   Medication Sig Start Date End Date Taking? Authorizing Provider  aspirin 81 MG tablet Take 81 mg by mouth daily.     Yes [provider]  calcium carbonate (OSCAL) 1500 (600 Ca) MG TABS tablet Take 1,500 mg by mouth daily with breakfast.   Yes [provider]  carvedilol (COREG) 3.125 MG tablet TAKE 1 TABLET BY MOUTH TWICE DAILY WITH A MEAL 04/15/18  Yes Newlin, Enobong, MD  EQ ALLERGY RELIEF, CETIRIZINE, 10 MG tablet TAKE 1 TABLET BY MOUTH ONCE DAILY 04/16/18  Yes Newlin, Enobong, MD  glipiZIDE (GLUCOTROL) 10 MG tablet Take 1 tablet (10 mg total) by mouth 2 (two) times daily before a meal. 04/15/18   Yes Newlin, Enobong, MD  isosorbide mononitrate (IMDUR) 60 MG 24 hr tablet Take 1 tablet (60 mg total) by mouth daily. 01/11/18  Yes Charlott Rakes, MD  rosuvastatin (CRESTOR) 5 MG tablet Take 1 tablet (5 mg total) by mouth daily. 01/11/18  Yes Charlott Rakes, MD  verapamil (CALAN) 80 MG tablet Take 1 tablet (80 mg total) by mouth 2 (two) times daily. 01/11/18  Yes Charlott Rakes, MD  Blood Glucose Monitoring Suppl (ONE TOUCH ULTRA 2) w/Device KIT 1 each by Does not apply route 3 (three) times daily. 01/11/18   Charlott Rakes, MD  famotidine (PEPCID) 20 MG tablet Take 1 tablet (20 mg total) by mouth 2 (two) times daily. Patient not taking: Reported on 04/24/2018 01/29/18   Collene Gobble, MD  fluticasone [facility]) 50 MCG/ACT nasal spray Place 2 sprays into both nostrils daily. Patient not taking: Reported on 04/24/2018 12/22/17   Collene Gobble, MD  gabapentin (NEURONTIN) 100 MG capsule Take 1 capsule (100 mg total) by mouth 3 (three) times daily. One three times daily Patient not taking: Reported on 04/24/2018 01/11/18   Charlott Rakes, MD  glucose blood (ONE TOUCH ULTRA TEST) test strip Use as instructed 3 times daily before meals 01/11/18   Charlott Rakes, MD  glucose blood (TRUE METRIX BLOOD GLUCOSE TEST) test strip Use as instructed TID 10/09/17   Charlott Rakes, MD  ipratropium (ATROVENT) 0.06 % nasal spray PLACE 2 SPRAY(S) IN EACH NOSTRIL TWICE DAILY AS NEEDED FOR DRAINAGE Patient not taking: Reported on 04/24/2018 04/15/18   Charlott Rakes, MD  Lancets (ONETOUCH ULTRASOFT) lancets Use as instructed 3 times daily before meals 01/11/18   Charlott Rakes, MD  levocetirizine (XYZAL) 5 MG tablet Take 1 tablet (5 mg total) by mouth every evening. Patient not taking: Reported on 01/18/2018 10/01/17   Dara Hoyer, FNP  montelukast (SINGULAIR) 10 MG tablet Take 1 tablet (10 mg total) by mouth at bedtime. Patient not taking: Reported on 01/30/2018 12/22/17   Collene Gobble, MD  pantoprazole (PROTONIX) 40 MG  tablet Take 1 tablet (40 mg total) by mouth daily. Patient not taking: Reported on 01/30/2018 12/22/17   Collene Gobble, MD    Family History Family History  Problem Relation Age of Onset  . Hypertension Mother   . Asthma Sister   . Colon cancer Neg Hx   . Allergic rhinitis Neg Hx   . Eczema Neg Hx     Social History Social History   Tobacco Use  . Smoking status: Never Smoker  . Smokeless tobacco: Never Used  Substance Use Topics  . Alcohol use: No  . Drug use: No  Allergies   Dust mite extract; Ace inhibitors; and Lovastatin   Review of Systems Review of Systems  All other systems reviewed and are negative.    Physical Exam Updated Vital Signs BP (!) 184/85   Pulse 71   Temp 97.9 F (36.6 C)   Resp 16   LMP  (LMP Unknown)   SpO2 97%   Physical Exam  Constitutional: She is oriented to person, place, and time. She appears well-developed and well-nourished.  HENT:  Head: Normocephalic and atraumatic.  Eyes: Pupils are equal, round, and reactive to light. Conjunctivae and EOM are normal.  Neck: Normal range of motion and phonation normal. Neck supple.  Cardiovascular: Normal rate.  Pulmonary/Chest: Effort normal. No respiratory distress. She exhibits no tenderness.  Musculoskeletal: Normal range of motion.  Right knee tender medially without deformity or instability.  Normal range of motion arms and legs.  No focal back or spine tenderness.  Neurological: She is alert and oriented to person, place, and time. She exhibits normal muscle tone.  Skin: Skin is warm and dry.  Psychiatric: She has a normal mood and affect. Her behavior is normal. Judgment and thought content normal.  Nursing note and vitals reviewed.    ED Treatments / Results  Labs (all labs ordered are listed, but only abnormal results are displayed) Labs Reviewed - No data to display  EKG None  Radiology Dg Thoracic Spine 2 View  Result Date: 04/24/2018 CLINICAL DATA:  Fall EXAM:  THORACIC SPINE 2 VIEWS COMPARISON:  None. FINDINGS: Normal thoracic kyphosis. No evidence of fracture or dislocation. Vertebral body heights are maintained. Mild multilevel degenerative changes. Visualized lungs are clear. IMPRESSION: Negative. Electronically Signed   By: Julian Hy M.D.   On: 04/24/2018 10:51   Dg Lumbar Spine Complete  Result Date: 04/24/2018 CLINICAL DATA:  Fall EXAM: LUMBAR SPINE - COMPLETE 4+ VIEW COMPARISON:  None. FINDINGS: Normal lumbar lordosis. No evidence of fracture or dislocation. Vertebral body heights are maintained. Mild degenerative changes of the lower thoracic spine. Mild degenerative changes at L3-4. Visualized bony pelvis appears intact. IMPRESSION: No fracture or dislocation is seen. Mild degenerative changes. Electronically Signed   By: Julian Hy M.D.   On: 04/24/2018 10:51   Dg Knee Complete 4 Views Right  Result Date: 04/24/2018 CLINICAL DATA:  Fall, right knee pain EXAM: RIGHT KNEE - COMPLETE 4+ VIEW COMPARISON:  None. FINDINGS: No fracture or dislocation is seen. The joint spaces are preserved. Suprapatellar and infrapatellar enthesopathy. Visualized soft tissues are within normal limits. No suprapatellar knee joint effusion. IMPRESSION: Negative. Electronically Signed   By: Julian Hy M.D.   On: 04/24/2018 10:52    Procedures Procedures (including critical care time)  Medications Ordered in ED Medications - No data to display   Initial Impression / Assessment and Plan / ED Course  I have reviewed the triage vital signs and the nursing notes.  Pertinent labs & imaging results that were available during my care of the patient were reviewed by me and considered in my medical decision making (see chart for details).  Clinical Course as of Apr 25 1103  Sat Apr 24, 2018  1101 Radiologic images reviewed by me, right knee, thoracic and lumbar spines, no fracture or dislocations.   [EW]    Clinical Course User Index [EW] Daleen Bo, MD     Patient Vitals for the past 24 hrs:  BP Temp Pulse Resp SpO2  04/24/18 0925 (!) 184/85 97.9 F (36.6 C) 71 16  97 %    11:04 AM Reevaluation with update and discussion. After initial assessment and treatment, an updated evaluation reveals no change in clinical status.  Findings discussed and questions answered. Daleen Bo   Medical Decision Making: Fall with contusions without evidence for serious injury.  Doubt fracture or significant soft tissue injury  CRITICAL CARE-no Performed by: Daleen Bo  Nursing Notes Reviewed/ Care Coordinated Applicable Imaging Reviewed Interpretation of Laboratory Data incorporated into ED treatment  The patient appears reasonably screened and/or stabilized for discharge and I doubt any other medical condition or other [facility] requiring further screening, evaluation, or treatment in the ED at this time prior to discharge.  Plan: Home Medications-use OTC analgesia; Home Treatments-rest, heat therapy; return here if the recommended treatment, does not improve the symptoms; Recommended follow up-PCP, PRN   Final Clinical Impressions(s) / ED Diagnoses   Final diagnoses:  Fall, initial encounter  Contusion, multiple sites    ED Discharge Orders    None       Daleen Bo, MD 04/24/18 1105

## 2018-04-28 ENCOUNTER — Ambulatory Visit: Payer: BLUE CROSS/BLUE SHIELD

## 2018-04-28 NOTE — Progress Notes (Deleted)
Patient ID: Paula [name], female   DOB: 1945/12/06, 72 y.o.   MRN: [id number]  After being seen in the ED 04/24/2018 after a fall.  Imaging of lumbar spine, T-spine, and R knee were negative other than mild degenerative changes of the Lumbar spine.    From ED note: She presents for evaluation of injuries to back, and right knee, when she fell yesterday while getting the mail.  Since then she has been ambulatory but complains of pain in the middle of her back in her right knee.  She denies headache, neck pain, focal weakness or paresthesia.  There are no other known modifying factors.   A/P: Plan: Home Medications-use OTC analgesia; Home Treatments-rest, heat therapy; return here if the recommended treatment, does not improve the symptoms; Recommended follow up-PCP, PRN

## 2018-04-30 ENCOUNTER — Telehealth: Payer: Self-pay | Admitting: Family Medicine

## 2018-04-30 NOTE — Telephone Encounter (Signed)
Patient called experiencing some dizziness and wants to get some advice regarding what she is experiencing. Please follow up with patient.

## 2018-05-12 ENCOUNTER — Ambulatory Visit (HOSPITAL_COMMUNITY)
Admission: EM | Admit: 2018-05-12 | Discharge: 2018-05-12 | Disposition: A | Discharge status: Home / Self Care | Payer: BLUE CROSS/BLUE SHIELD | Attending: Family Medicine | Admitting: Family Medicine

## 2018-05-12 ENCOUNTER — Emergency Department (HOSPITAL_COMMUNITY): Payer: Self-pay

## 2018-05-12 ENCOUNTER — Encounter (HOSPITAL_COMMUNITY): Payer: Self-pay | Admitting: Emergency Medicine

## 2018-05-12 ENCOUNTER — Other Ambulatory Visit: Payer: Self-pay

## 2018-05-12 ENCOUNTER — Emergency Department (HOSPITAL_COMMUNITY)
Admission: EM | Admit: 2018-05-12 | Discharge: 2018-05-12 | Disposition: A | Discharge status: Home / Self Care | Payer: Self-pay | Attending: Emergency Medicine | Admitting: Emergency Medicine

## 2018-05-12 DIAGNOSIS — I130 Hypertensive heart and chronic kidney disease with heart failure and stage 1 through stage 4 chronic kidney disease, or unspecified chronic kidney disease: Secondary | ICD-10-CM | POA: Insufficient documentation

## 2018-05-12 DIAGNOSIS — R42 Dizziness and giddiness: Secondary | ICD-10-CM

## 2018-05-12 DIAGNOSIS — Z7982 Long term (current) use of aspirin: Secondary | ICD-10-CM | POA: Insufficient documentation

## 2018-05-12 DIAGNOSIS — I671 Cerebral aneurysm, nonruptured: Secondary | ICD-10-CM | POA: Insufficient documentation

## 2018-05-12 DIAGNOSIS — E119 Type 2 diabetes mellitus without complications: Secondary | ICD-10-CM | POA: Insufficient documentation

## 2018-05-12 DIAGNOSIS — N183 Chronic kidney disease, stage 3 (moderate): Secondary | ICD-10-CM | POA: Insufficient documentation

## 2018-05-12 DIAGNOSIS — Z79899 Other long term (current) drug therapy: Secondary | ICD-10-CM | POA: Insufficient documentation

## 2018-05-12 DIAGNOSIS — I5032 Chronic diastolic (congestive) heart failure: Secondary | ICD-10-CM | POA: Insufficient documentation

## 2018-05-12 LAB — BASIC METABOLIC PANEL
Anion gap: 9 (ref 5–15)
BUN: 29 mg/dL — ABNORMAL HIGH (ref 8–23)
CO2: 26 mmol/L (ref 22–32)
Calcium: 9.4 mg/dL (ref 8.9–10.3)
Chloride: 102 mmol/L (ref 98–111)
Creatinine, Ser: 1.74 mg/dL — ABNORMAL HIGH (ref 0.44–1.00)
GFR calc Af Amer: 33 mL/min — ABNORMAL LOW (ref >60)
GFR calc non Af Amer: 28 mL/min — ABNORMAL LOW (ref >60)
Glucose, Bld: 93 mg/dL (ref 70–99)
Potassium: 3.6 mmol/L (ref 3.5–5.1)
Sodium: 137 mmol/L (ref 135–145)

## 2018-05-12 LAB — POCT I-STAT, CHEM 8
BUN: 33 mg/dL — ABNORMAL HIGH (ref 8–23)
Calcium, Ion: 1.17 mmol/L (ref 1.15–1.40)
Chloride: 99 mmol/L (ref 98–111)
Creatinine, Ser: 2 mg/dL — ABNORMAL HIGH (ref 0.44–1.00)
Glucose, Bld: 158 mg/dL — ABNORMAL HIGH (ref 70–99)
HCT: 40 % (ref 36.0–46.0)
Hemoglobin: 13.6 g/dL (ref 12.0–15.0)
Potassium: 3.7 mmol/L (ref 3.5–5.1)
Sodium: 138 mmol/L (ref 135–145)
TCO2: 29 mmol/L (ref 22–32)

## 2018-05-12 LAB — CBC WITH DIFFERENTIAL/PLATELET
Abs Immature Granulocytes: 0 10*3/uL (ref 0.0–0.1)
Basophils Absolute: 0.1 10*3/uL (ref 0.0–0.1)
Basophils Relative: 1 %
Eosinophils Absolute: 0.2 10*3/uL (ref 0.0–0.7)
Eosinophils Relative: 2 %
HCT: 40.1 % (ref 36.0–46.0)
Hemoglobin: 13.5 g/dL (ref 12.0–15.0)
Immature Granulocytes: 1 %
Lymphocytes Relative: 29 %
Lymphs Abs: 1.9 10*3/uL (ref 0.7–4.0)
MCH: 28.4 pg (ref 26.0–34.0)
MCHC: 33.7 g/dL (ref 30.0–36.0)
MCV: 84.2 fL (ref 78.0–100.0)
Monocytes Absolute: 0.5 10*3/uL (ref 0.1–1.0)
Monocytes Relative: 7 %
Neutro Abs: 3.8 10*3/uL (ref 1.7–7.7)
Neutrophils Relative %: 60 %
Platelets: 181 10*3/uL (ref 150–400)
RBC: 4.76 MIL/uL (ref 3.87–5.11)
RDW: 14.3 % (ref 11.5–15.5)
WBC: 6.4 10*3/uL (ref 4.0–10.5)

## 2018-05-12 LAB — POCT URINALYSIS DIP (DEVICE)
Bilirubin Urine: NEGATIVE
Glucose, UA: NEGATIVE mg/dL
Hgb urine dipstick: NEGATIVE
Ketones, ur: NEGATIVE mg/dL
Leukocytes, UA: NEGATIVE
Nitrite: NEGATIVE
Protein, ur: >=300 mg/dL — AB
Specific Gravity, Urine: 1.02 (ref 1.005–1.030)
Urobilinogen, UA: 0.2 mg/dL (ref 0.0–1.0)
pH: 5.5 (ref 5.0–8.0)

## 2018-05-12 MED ORDER — IOPAMIDOL (ISOVUE-370) INJECTION 76%: 50.0000 mL | Freq: Once | INTRAVENOUS | Status: DC | PRN

## 2018-05-12 MED ORDER — SODIUM CHLORIDE 0.9 % IV BOLUS
1000.0000 mL | Freq: Once | INTRAVENOUS | Status: AC
  Administered 2018-05-12: 1000 mL via INTRAVENOUS

## 2018-05-12 MED ORDER — HYDRALAZINE HCL 20 MG/ML IJ SOLN
5.0000 mg | Freq: Once | INTRAMUSCULAR | Status: AC
  Administered 2018-05-12: 5 mg via INTRAVENOUS
  Filled 2018-05-12: qty 1

## 2018-05-12 MED ORDER — IOPAMIDOL (ISOVUE-370) INJECTION 76%
INTRAVENOUS | Status: AC
  Filled 2018-05-12: qty 50

## 2018-05-12 MED ORDER — IOPAMIDOL (ISOVUE-370) INJECTION 76%
50.0000 mL | Freq: Once | INTRAVENOUS | Status: AC | PRN
  Administered 2018-05-12: 50 mL via INTRAVENOUS

## 2018-05-12 NOTE — ED Provider Notes (Signed)
[facility]    CSN: [id number] Arrival date & time: 05/12/18  1111     History   Chief Complaint Chief Complaint  Patient presents with  . Dizziness    HPI Paula [name] is a 72 y.o. female.   Paula [name] presents with complaints of dizziness which has been ongoing for the past two weeks at least. Has not been progressive. Has not worsened. Intermittent headache. Denies any current headache. No chest pain or palpitations. No nausea or vomiting, no sweating. States tripped and fell two weeks ago, went to er and had imaging done. Does not know that she hit her head or lost consciousness. She has a known brain aneurysm. She has not followed with neurology or radiology in regards to this. Has seen neurology for chronic headache as well. She has been eating and drinking well. Getting up to standing and activity can worsen her dizziness. Feels better at rest. No weakness. No fevers. Her blood sugars have been well managed. Hx of asthma, arthritis, CKD, cough, DM, chf, hyperlipidemia, htn, neuropathy. Has been taking her BP medications, takes imdur daily as well.      ROS per HPI.      Past Medical History:  Diagnosis Date  . Allergic rhinitis   . Arthritis   . Asthma   . Brain aneurysm   . Chronic kidney disease    Stage 3  . Cough   . Diabetes mellitus   . Diastolic CHF, chronic ([place]) 10/11/2011  . GERD (gastroesophageal reflux disease)   . History of colon polyps 2012   tubular adenoma   . Hyperlipidemia   . Hypertension   . Neuropathy 10/11/2011  . PONV (postoperative nausea and vomiting)    one time after lymph node surgery    Patient Active Problem List   Diagnosis Date Noted  . Upper airway cough syndrome 10/12/2017  . Chronic rhinitis 10/01/2017  . Seasonal allergic conjunctivitis 10/01/2017  . Aneurysm ([place]) 09/24/2017  . Acute metabolic encephalopathy 63/87/5643  . URI (upper respiratory infection) 09/02/2017  . Facial weakness 09/02/2017  .  Benign essential HTN 09/02/2017  . Allergic rhinitis 08/20/2017  . Chronic pain 05/07/2017  . Palpitations 07/10/2016  . Insomnia 03/25/2016  . Gout 01/23/2016  . History of cataract 02/27/2015  . Seasonal and perennial allergic rhinitis 02/15/2012  . Cough 11/04/2011  . Diabetic neuropathy ([place]) 10/11/2011  . Chronic diastolic congestive heart failure ([place]) 10/11/2011  . CKD (chronic kidney disease) stage 3, GFR 30-59 ml/min ([facility]) 10/10/2011  . GERD (gastroesophageal reflux disease)   . Type 2 diabetes mellitus without complication, without long-term current use of insulin ([place]) 07/03/2010  . Hyperlipidemia 07/03/2010  . Essential hypertension 07/03/2010    Past Surgical History:  Procedure Laterality Date  . CATARACT EXTRACTION     right eye  . IR 3D INDEPENDENT WKST  12/14/2017  . IR ANGIO INTRA EXTRACRAN SEL COM CAROTID INNOMINATE BILAT MOD SED  09/15/2017  . IR ANGIO INTRA EXTRACRAN SEL COM CAROTID INNOMINATE UNI L MOD SED  12/14/2017  . IR ANGIO VERTEBRAL SEL VERTEBRAL BILAT MOD SED  09/15/2017  . IR RADIOLOGIST EVAL & MGMT  09/10/2017  . IR RADIOLOGIST EVAL & MGMT  10/19/2017  . lymphatic mass surgery    . lymphoma mass surgery Bilateral    non cancerous  . NASAL TURBINATE REDUCTION    . RADIOLOGY WITH ANESTHESIA N/A 12/14/2017   Procedure: RADIOLOGY WITH ANESTHESIA EMBOLIZATION;  Surgeon: Luanne Bras, MD;  Location: [facility];  Service: Radiology;  Laterality: N/A;  . VIDEO BRONCHOSCOPY Bilateral 11/18/2017   Procedure: VIDEO BRONCHOSCOPY WITHOUT FLUORO;  Surgeon: Collene Gobble, MD;  Location[facility];  Service: Cardiopulmonary;  Laterality: Bilateral;    OB History    Gravida  5   Para  4   Term  4   Preterm      AB  1   Living  4     SAB      TAB  1   Ectopic      Multiple      Live Births               Home Medications    Prior to Admission medications   Medication Sig Start Date End Date Taking? Authorizing Provider  aspirin 81 MG  tablet Take 81 mg by mouth daily.      [provider]  Blood Glucose Monitoring Suppl (ONE TOUCH ULTRA 2) w/Device KIT 1 each by Does not apply route 3 (three) times daily. 01/11/18   Charlott Rakes, MD  calcium carbonate (OSCAL) 1500 (600 Ca) MG TABS tablet Take 1,500 mg by mouth daily with breakfast.    [provider]  carvedilol (COREG) 3.125 MG tablet TAKE 1 TABLET BY MOUTH TWICE DAILY WITH A MEAL 04/15/18   Charlott Rakes, MD  EQ ALLERGY RELIEF, CETIRIZINE, 10 MG tablet TAKE 1 TABLET BY MOUTH ONCE DAILY 04/16/18   Charlott Rakes, MD  famotidine (PEPCID) 20 MG tablet Take 1 tablet (20 mg total) by mouth 2 (two) times daily. Patient not taking: Reported on 04/24/2018 01/29/18   Collene Gobble, MD  fluticasone [facility]) 50 MCG/ACT nasal spray Place 2 sprays into both nostrils daily. Patient not taking: Reported on 04/24/2018 12/22/17   Collene Gobble, MD  gabapentin (NEURONTIN) 100 MG capsule Take 1 capsule (100 mg total) by mouth 3 (three) times daily. One three times daily Patient not taking: Reported on 04/24/2018 01/11/18   Charlott Rakes, MD  glipiZIDE (GLUCOTROL) 10 MG tablet Take 1 tablet (10 mg total) by mouth 2 (two) times daily before a meal. 04/15/18   Charlott Rakes, MD  glucose blood (ONE TOUCH ULTRA TEST) test strip Use as instructed 3 times daily before meals 01/11/18   Charlott Rakes, MD  glucose blood (TRUE METRIX BLOOD GLUCOSE TEST) test strip Use as instructed TID 10/09/17   Charlott Rakes, MD  ipratropium (ATROVENT) 0.06 % nasal spray PLACE 2 SPRAY(S) IN EACH NOSTRIL TWICE DAILY AS NEEDED FOR DRAINAGE Patient not taking: Reported on 04/24/2018 04/15/18   Charlott Rakes, MD  isosorbide mononitrate (IMDUR) 60 MG 24 hr tablet Take 1 tablet (60 mg total) by mouth daily. 01/11/18   Charlott Rakes, MD  Lancets (ONETOUCH ULTRASOFT) lancets Use as instructed 3 times daily before meals 01/11/18   Charlott Rakes, MD  levocetirizine (XYZAL) 5 MG tablet Take 1 tablet (5 mg total) by  mouth every evening. Patient not taking: Reported on 01/18/2018 10/01/17   Dara Hoyer, FNP  montelukast (SINGULAIR) 10 MG tablet Take 1 tablet (10 mg total) by mouth at bedtime. Patient not taking: Reported on 01/30/2018 12/22/17   Collene Gobble, MD  pantoprazole (PROTONIX) 40 MG tablet Take 1 tablet (40 mg total) by mouth daily. Patient not taking: Reported on 01/30/2018 12/22/17   Collene Gobble, MD  rosuvastatin (CRESTOR) 5 MG tablet Take 1 tablet (5 mg total) by mouth daily. 01/11/18   Charlott Rakes, MD  verapamil (CALAN) 80 MG tablet Take  1 tablet (80 mg total) by mouth 2 (two) times daily. 01/11/18   Charlott Rakes, MD    Family History Family History  Problem Relation Age of Onset  . Hypertension Mother   . Asthma Sister   . Colon cancer Neg Hx   . Allergic rhinitis Neg Hx   . Eczema Neg Hx     Social History Social History   Tobacco Use  . Smoking status: Never Smoker  . Smokeless tobacco: Never Used  Substance Use Topics  . Alcohol use: No  . Drug use: No     Allergies   Dust mite extract; Ace inhibitors; and Lovastatin   Review of Systems Review of Systems   Physical Exam Triage Vital Signs ED Triage Vitals  Enc Vitals Group     BP 05/12/18 1230 (!) 165/70     Pulse Rate 05/12/18 1230 71     Resp 05/12/18 1230 18     Temp 05/12/18 1230 98 F (36.7 C)     Temp Source 05/12/18 1230 Oral     SpO2 05/12/18 1230 98 %     Weight --      Height --      Head Circumference --      Peak Flow --      Pain Score 05/12/18 1231 0     Pain Loc --      Pain Edu? --      Excl. in [place]? --    Orthostatic VS for the past 24 hrs:  BP- Sitting Pulse- Sitting BP- Standing at 0 minutes Pulse- Standing at 0 minutes  05/12/18 1231 165/70 71 186/71 88    Updated Vital Signs BP (!) 165/70 (BP Location: Left Arm)   Pulse 71   Temp 98 F (36.7 C) (Oral)   Resp 18   LMP  (LMP Unknown)   SpO2 98%    Physical Exam  Constitutional: She is oriented to person, place,  and time. She appears well-developed and well-nourished. No distress.  HENT:  Head: Normocephalic and atraumatic.  Right Ear: External ear normal.  Left Ear: External ear normal.  Mouth/Throat: Oropharynx is clear and moist.  Eyes: Pupils are equal, round, and reactive to light. Conjunctivae and EOM are normal.  Neck: Normal range of motion.  Cardiovascular: Normal rate, regular rhythm and normal heart sounds.  Pulmonary/Chest: Effort normal and breath sounds normal.  Neurological: She is alert and oriented to person, place, and time. She has normal strength. She is not disoriented. No sensory deficit. GCS eye subscore is 4. GCS verbal subscore is 5. GCS motor subscore is 6.  Mild dizziness noted with [place]; ambulatory without difficulty   Skin: Skin is warm and dry.     UC Treatments / Results  Labs (all labs ordered are listed, but only abnormal results are displayed) Labs Reviewed  POCT I-STAT, CHEM 8 - Abnormal; Notable for the following components:      Result Value   BUN 33 (*)    Creatinine, Ser 2.00 (*)    Glucose, Bld 158 (*)    All other components within normal limits  POCT URINALYSIS DIP (DEVICE) - Abnormal; Notable for the following components:   Protein, ur >=300 (*)    All other components within normal limits    EKG None  Radiology No results found.  Procedures Procedures (including critical care time)  Medications Ordered in UC Medications - No data to display  Initial Impression / Assessment and Plan / UC Course  I have reviewed the triage vital signs and the nursing notes.  Pertinent labs & imaging results that were available during my care of the patient were reviewed by me and considered in my medical decision making (see chart for details).     Creatinine 2.0 here today which appears slightly elevated from baseline from here, 1.88, 1.58 noted as last two creatinines. Protein to urine, otherwise urine unremarkable. Alert, oriented, ambulatory,  without acute neurological findings. Known brain aneurysm. No headache. Bps slightly elevated, no orthostatic hypotension. EKG without acute findings which would relate to dizziness. Persistent dizziness for two weeks, not progressive. No known head injury. Patient has physicians she is willing to follow up with closely, vs go to ER discussed. Patient states she feels she may go to the ER for further evaluation due to her concern about her aneurysm and she is worried she is dehydrated. Safe for self transport at this time. Return precautions provided. Patient verbalized understanding and agreeable to plan.   Ambulatory out of clinic without difficulty.    Case discussed and ekg reviewed with supervising physician dr. Mannie Stabile.  Final Clinical Impressions(s) / UC Diagnoses   Final diagnoses:  Dizziness     Discharge Instructions     Your exam is reassuring here today, as is your ekg and basic lab tests.  Your kidney function is very slightly elevated, please continue to drink plenty of fluids.  Please follow up with your physician/PCP for recheck. For further concern about your aneurysm I would recommend seeing Dr. Estanislado Pandy or going to the Er.  If develop worsening of headache, dizziness, nausea, vomiting, chest pain  please go to the Er.    ED Prescriptions    None     Controlled Substance Prescriptions [place] Controlled Substance Registry consulted? Not Applicable   Zigmund Gottron, NP 05/12/18 1327

## 2018-05-12 NOTE — ED Notes (Signed)
Patient verbalizes understanding of medications and discharge instructions. No further questions at this time. VSS and patient ambulatory at discharge.   

## 2018-05-12 NOTE — ED Notes (Signed)
Called pt x3 for vital reassessment. No answer.

## 2018-05-12 NOTE — ED Notes (Signed)
Patient ambulated to bathroom with minimal assistance. Steady gait noted.

## 2018-05-12 NOTE — ED Notes (Signed)
Patient updated on wait for IV team.

## 2018-05-12 NOTE — ED Triage Notes (Signed)
Pt sts dizziness x 2 weeks worse with position change; pt sts hospitalization last month after fall

## 2018-05-12 NOTE — Discharge Instructions (Addendum)
Call Dr. Arlean Hopping office to set up an appointment. It is very important to be seen tomorrow, 10/3.  You should have his number, if not, additional numbers to try are above and [phone].  If you develop recurrent HEADACHE, VERTIGO, DIZZINESS, or worsening symptoms in the mean time, come back to the ER immediately  Make sure you take your blood pressure medications AS SOON as you leave the ER

## 2018-05-12 NOTE — Discharge Instructions (Signed)
Your exam is reassuring here today, as is your ekg and basic lab tests.  Your kidney function is very slightly elevated, please continue to drink plenty of fluids.  Please follow up with your physician/PCP for recheck. For further concern about your aneurysm I would recommend seeing Dr. Estanislado Pandy or going to the Er.  If develop worsening of headache, dizziness, nausea, vomiting, chest pain  please go to the Er.

## 2018-05-12 NOTE — ED Notes (Signed)
IV team placed 22g, informed we need a 20g for CTA Head.

## 2018-05-12 NOTE — ED Notes (Signed)
Patient refused 3rd IV. Requests to speak with MD about need for CT.

## 2018-05-12 NOTE — ED Notes (Signed)
18g placed by Dr. Ellender Hose.

## 2018-05-12 NOTE — ED Provider Notes (Signed)
Patient placed in Quick Look pathway, seen and evaluated   Chief Complaint: headache  HPI:  Paula [name] is a 72 y.o. with hx of hx of brain aneurysm who present to the ED with severe headache. Pt reports dizziness with headache x2 days. Symptoms continue even with hydration. She is a patient of Dr. Estanislado Pandy for brain aneurysm. She is concerned.  ROS: Neuro: headache  Physical Exam:  BP (!) 178/89 (BP Location: Right Arm)   Pulse 68   Temp 98.4 F (36.9 C) (Oral)   Resp 17   LMP  (LMP Unknown)   SpO2 96%    Gen: No distress  Neuro: Awake and Alert, no weakness  Skin: Warm and dry  Eyes: no vision change, good occular movement.   Resp: no distress  Initiation of care has begun. The patient has been counseled on the process, plan, and necessity for staying for the completion/evaluation, and the remainder of the medical screening examination    Ashley Murrain, NP 05/12/18 [place]    Drenda Freeze, MD 05/12/18 [phone]

## 2018-05-12 NOTE — ED Notes (Signed)
MD notified of pt's HTN

## 2018-05-12 NOTE — ED Provider Notes (Signed)
[facility] EMERGENCY DEPARTMENT Provider Note   CSN: [id number] Arrival date & time: 05/12/18  1336     History   Chief Complaint Chief Complaint  Patient presents with  . Dizziness    HPI Paula [name] is a 72 y.o. female.  HPI   72 year old female with extensive past medical history as below including recurrent dehydration and orthostasis as well as known cerebral aneurysms here with mild dizziness upon standing.  The patient states that over the last several days, she has noticed more dizziness with standing, though it is also persisted intermittently without standing.  She is had a mild, generalized headache.  She has history of previous headaches and is followed by Dr. Marjory Lies w/ interventional radiology.  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  She denies any thunderclap headache or severe symptoms.  No recent trauma.  She is been taking blood pressure medications.  No recent cough or heavy lifting.  She currently feels fine when sitting and otherwise at her baseline.  Denies any focal numbness or weakness.  No vision changes.  Past Medical History:  Diagnosis Date  . Allergic rhinitis   . Arthritis   . Asthma   . Brain aneurysm   . Chronic kidney disease    Stage 3  . Cough   . Diabetes mellitus   . Diastolic CHF, chronic ([place]) 10/11/2011  . GERD (gastroesophageal reflux disease)   . History of colon polyps 2012   tubular adenoma   . Hyperlipidemia   . Hypertension   . Neuropathy 10/11/2011  . PONV (postoperative nausea and vomiting)    one time after lymph node surgery    Patient Active Problem List   Diagnosis Date Noted  . Upper airway cough syndrome 10/12/2017  . Chronic rhinitis 10/01/2017  . Seasonal allergic conjunctivitis 10/01/2017  . Aneurysm ([place]) 09/24/2017  . Acute metabolic encephalopathy 03/47/4259  . URI (upper respiratory infection) 09/02/2017  . Facial weakness 09/02/2017  .  Benign essential HTN 09/02/2017  . Allergic rhinitis 08/20/2017  . Chronic pain 05/07/2017  . Palpitations 07/10/2016  . Insomnia 03/25/2016  . Gout 01/23/2016  . History of cataract 02/27/2015  . Seasonal and perennial allergic rhinitis 02/15/2012  . Cough 11/04/2011  . Diabetic neuropathy ([place]) 10/11/2011  . Chronic diastolic congestive heart failure ([place]) 10/11/2011  . CKD (chronic kidney disease) stage 3, GFR 30-59 ml/min ([facility]) 10/10/2011  . GERD (gastroesophageal reflux disease)   . Type 2 diabetes mellitus without complication, without long-term current use of insulin ([place]) 07/03/2010  . Hyperlipidemia 07/03/2010  . Essential hypertension 07/03/2010    Past Surgical History:  Procedure Laterality Date  . CATARACT EXTRACTION     right eye  . IR 3D INDEPENDENT WKST  12/14/2017  . IR ANGIO INTRA EXTRACRAN SEL COM CAROTID INNOMINATE BILAT MOD SED  09/15/2017  . IR ANGIO INTRA EXTRACRAN SEL COM CAROTID INNOMINATE UNI L MOD SED  12/14/2017  . IR ANGIO VERTEBRAL SEL VERTEBRAL BILAT MOD SED  09/15/2017  . IR RADIOLOGIST EVAL & MGMT  09/10/2017  . IR RADIOLOGIST EVAL & MGMT  10/19/2017  . lymphatic mass surgery    . lymphoma mass surgery Bilateral    non cancerous  . NASAL TURBINATE REDUCTION    . RADIOLOGY WITH ANESTHESIA N/A 12/14/2017   Procedure: RADIOLOGY WITH ANESTHESIA EMBOLIZATION;  Surgeon: Luanne Bras, MD;  Location: [facility];  Service: Radiology;  Laterality: N/A;  . VIDEO BRONCHOSCOPY Bilateral 11/18/2017  Procedure: VIDEO BRONCHOSCOPY WITHOUT FLUORO;  Surgeon: Collene Gobble, MD;  Location: [facility];  Service: Cardiopulmonary;  Laterality: Bilateral;     OB History    Gravida  5   Para  4   Term  4   Preterm      AB  1   Living  4     SAB      TAB  1   Ectopic      Multiple      Live Births               Home Medications    Prior to Admission medications   Medication Sig Start Date End Date Taking? Authorizing Provider  aspirin 81 MG  tablet Take 81 mg by mouth daily.     Yes [provider]  calcium carbonate (OSCAL) 1500 (600 Ca) MG TABS tablet Take 1,500 mg by mouth daily with breakfast.   Yes [provider]  carvedilol (COREG) 3.125 MG tablet TAKE 1 TABLET BY MOUTH TWICE DAILY WITH A MEAL 04/15/18  Yes Newlin, Enobong, MD  Cholecalciferol (VITAMIN D PO) Take 1 tablet by mouth daily.   Yes [provider]  EQ ALLERGY RELIEF, CETIRIZINE, 10 MG tablet TAKE 1 TABLET BY MOUTH ONCE DAILY 04/16/18  Yes Newlin, Enobong, MD  glipiZIDE (GLUCOTROL) 10 MG tablet Take 1 tablet (10 mg total) by mouth 2 (two) times daily before a meal. 04/15/18  Yes Newlin, Enobong, MD  isosorbide mononitrate (IMDUR) 60 MG 24 hr tablet Take 1 tablet (60 mg total) by mouth daily. 01/11/18  Yes Charlott Rakes, MD  Polyethyl Glycol-Propyl Glycol (SYSTANE OP) Place 1 drop into both eyes daily as needed (dry eyes).   Yes [provider]  rosuvastatin (CRESTOR) 5 MG tablet Take 1 tablet (5 mg total) by mouth daily. 01/11/18  Yes Charlott Rakes, MD  verapamil (CALAN) 80 MG tablet Take 1 tablet (80 mg total) by mouth 2 (two) times daily. 01/11/18  Yes Charlott Rakes, MD  Blood Glucose Monitoring Suppl (ONE TOUCH ULTRA 2) w/Device KIT 1 each by Does not apply route 3 (three) times daily. 01/11/18   Charlott Rakes, MD  famotidine (PEPCID) 20 MG tablet Take 1 tablet (20 mg total) by mouth 2 (two) times daily. Patient not taking: Reported on 04/24/2018 01/29/18   Collene Gobble, MD  fluticasone [facility] 50 MCG/ACT nasal spray Place 2 sprays into both nostrils daily. Patient not taking: Reported on 05/12/2018 12/22/17   Collene Gobble, MD  gabapentin (NEURONTIN) 100 MG capsule Take 1 capsule (100 mg total) by mouth 3 (three) times daily. One three times daily Patient not taking: Reported on 04/24/2018 01/11/18   Charlott Rakes, MD  glucose blood (ONE TOUCH ULTRA TEST) test strip Use as instructed 3 times daily before meals 01/11/18   Charlott Rakes, MD  glucose blood (TRUE METRIX BLOOD GLUCOSE TEST) test strip Use as instructed TID 10/09/17   Charlott Rakes, MD  ipratropium (ATROVENT) 0.06 % nasal spray PLACE 2 SPRAY(S) IN EACH NOSTRIL TWICE DAILY AS NEEDED FOR DRAINAGE Patient not taking: Reported on 04/24/2018 04/15/18   Charlott Rakes, MD  Lancets (ONETOUCH ULTRASOFT) lancets Use as instructed 3 times daily before meals 01/11/18   Charlott Rakes, MD  levocetirizine (XYZAL) 5 MG tablet Take 1 tablet (5 mg total) by mouth every evening. Patient not taking: Reported on 01/18/2018 10/01/17   Dara Hoyer, FNP  montelukast (SINGULAIR) 10 MG tablet Take 1 tablet (10 mg total)  by mouth at bedtime. Patient not taking: Reported on 01/30/2018 12/22/17   Collene Gobble, MD  pantoprazole (PROTONIX) 40 MG tablet Take 1 tablet (40 mg total) by mouth daily. Patient not taking: Reported on 01/30/2018 12/22/17   Collene Gobble, MD    Family History Family History  Problem Relation Age of Onset  . Hypertension Mother   . Asthma Sister   . Colon cancer Neg Hx   . Allergic rhinitis Neg Hx   . Eczema Neg Hx     Social History Social History   Tobacco Use  . Smoking status: Never Smoker  . Smokeless tobacco: Never Used  Substance Use Topics  . Alcohol use: No  . Drug use: No     Allergies   Dust mite extract; Ace inhibitors; and Lovastatin   Review of Systems Review of Systems  Constitutional: Positive for fatigue. Negative for chills and fever.  HENT: Negative for congestion and rhinorrhea.   Eyes: Negative for visual disturbance.  Respiratory: Negative for cough, shortness of breath and wheezing.   Cardiovascular: Negative for chest pain and leg swelling.  Gastrointestinal: Negative for abdominal pain, diarrhea, nausea and vomiting.  Genitourinary: Negative for dysuria and flank pain.  Musculoskeletal: Negative for neck pain and neck stiffness.  Skin: Negative for rash and wound.  Allergic/Immunologic: Negative for  immunocompromised state.  Neurological: Positive for dizziness and headaches. Negative for syncope and weakness.  All other systems reviewed and are negative.    Physical Exam Updated Vital Signs BP (!) 157/97   Pulse 79   Temp 98.4 F (36.9 C) (Oral)   Resp (!) 21   LMP  (LMP Unknown)   SpO2 96%   Physical Exam  Constitutional: She is oriented to person, place, and time. She appears well-developed and well-nourished. No distress.  HENT:  Head: Normocephalic and atraumatic.  Dry MM  Eyes: Conjunctivae are normal.  Neck: Neck supple.  Cardiovascular: Normal rate, regular rhythm and normal heart sounds. Exam reveals no friction rub.  No murmur heard. Pulmonary/Chest: Effort normal and breath sounds normal. No respiratory distress. She has no wheezes. She has no rales.  Abdominal: She exhibits no distension.  Musculoskeletal: She exhibits no edema.  Neurological: She is alert and oriented to person, place, and time. She exhibits normal muscle tone.  Skin: Skin is warm. Capillary refill takes less than 2 seconds.  Psychiatric: She has a normal mood and affect.  Nursing note and vitals reviewed.   Neurological Exam:  Mental Status: Alert and oriented to person, place, and time. Attention and concentration normal. Speech clear. Recent memory is intact. Cranial Nerves: Visual fields grossly intact. EOMI and PERRLA. No nystagmus noted. Facial sensation intact at forehead, maxillary cheek, and chin/mandible bilaterally. No facial asymmetry or weakness. Hearing grossly normal. Uvula is midline, and palate elevates symmetrically. Normal SCM and trapezius strength. Tongue midline without fasciculations. Motor: Muscle strength 5/5 in proximal and distal UE and LE bilaterally. No pronator drift. Muscle tone normal. Reflexes: 2+ and symmetrical in all four extremities.  Sensation: Intact to light touch in upper and lower extremities distally bilaterally.  Gait: Normal without  ataxia. Coordination: Normal FTN bilaterally.      ED Treatments / Results  Labs (all labs ordered are listed, but only abnormal results are displayed) Labs Reviewed  BASIC METABOLIC PANEL - Abnormal; Notable for the following components:      Result Value   BUN 29 (*)    Creatinine, Ser 1.74 (*)  GFR calc non Af Amer 28 (*)    GFR calc Af Amer 33 (*)    All other components within normal limits  CBC WITH DIFFERENTIAL/PLATELET    EKG None  Radiology Ct Angio Head W Or Wo Contrast  Result Date: 05/12/2018 CLINICAL DATA:  72 y/o F; cerebral aneurysm, subarachnoid hemorrhage, cerebral vasospasm evaluation. EXAM: CT ANGIOGRAPHY HEAD TECHNIQUE: Multidetector CT imaging of the head was performed using the standard protocol during bolus administration of intravenous contrast. Multiplanar CT image reconstructions and MIPs were obtained to evaluate the vascular anatomy. CONTRAST:  47 cc Isovue 370 COMPARISON:  12/14/2017 and 09/15/2017 cerebral angiograms. 09/02/2017 MRA of the head. FINDINGS: CT HEAD Brain: No evidence of acute infarction, hemorrhage, hydrocephalus, extra-axial collection or mass lesion/mass effect. Stable chronic microvascular ischemic changes and volume loss of the brain. Vascular: As below. Skull: Normal. Negative for fracture or focal lesion. Sinuses: Imaged portions are clear. Orbits: No acute finding. Bilateral intra-ocular lens replacement. CTA HEAD Anterior circulation: Left ICA petrous cavernous junction 50% stenosis. Mild fusiform dilatation of proximal left ICA cavernous segment (series 13, image 107). Left posterior communicating artery wide neck saccular aneurysm measuring 3.8 x 2.7 mm (series 13, image 106 and series 16, image 23). Additionally, there is stable mild fusiform ectasia of the adjacent paraclinoid left ICA (series 13, image 105). Moderate stenosis of the right carotid siphon. No large vessel occlusion, new aneurysm, or high-grade stenosis. Posterior  circulation: No significant stenosis, proximal occlusion, aneurysm, or vascular malformation. Mild mid basilar stenosis. Venous sinuses: As permitted by contrast timing, patent. Anatomic variants: Large left A1, large anterior communicating artery, hypoplastic right A1, normal variant. Delayed phase: No abnormal intracranial enhancement. IMPRESSION: CT head: 1. No acute intracranial abnormality identified. 2. Stable chronic microvascular ischemic changes and volume loss of the brain. CTA head: 1. Stable left posterior communicating artery wide neck saccular aneurysm measuring 3.8 x 2.7 mm. 2. Stable mild fusiform ectasia of adjacent paraclinoid left ICA. 3. Stable moderate stenosis of right carotid siphon. 4. Stable left ICA petrous cavernous junction moderate stenosis. Stable mild fusiform dilatation of left ICA proximal cavernous segment. 5. Otherwise no large vessel occlusion, additional aneurysm, or vascular malformation of Circle of Willis is identified. Electronically Signed   By: Kristine Garbe M.D.   On: 05/12/2018 22:58    Procedures Procedures (including critical care time)  Medications Ordered in ED Medications  iopamidol (ISOVUE-370) 76 % injection 50 mL (has no administration in time range)  sodium chloride 0.9 % bolus 1,000 mL (0 mLs Intravenous Stopped 05/12/18 1921)  hydrALAZINE (APRESOLINE) injection 5 mg (5 mg Intravenous Given 05/12/18 1924)  iopamidol (ISOVUE-370) 76 % injection 50 mL (50 mLs Intravenous Contrast Given 05/12/18 2214)    Emergency Ultrasound Study:   Angiocath insertion Performed by: Evonnie Pat Consent: Verbal consent/emergent consent obtained. Risks and benefits: risks, benefits and alternatives were discussed Immediately prior to procedure the correct patient, procedure, equipment, support staff and site/side marked as needed.  Indication: difficult IV access Preparation: Patient was prepped and draped in the usual sterile fashion. Sterile gel  was used for this procedure and the ultrasound probe was sterilized prior to use. Vein Location: Left AC vein was visualized during assessment for potential access sites and was found to be patent/ easily compressed with linear ultrasound.  The needle was visualized with real-time ultrasound and guided into the vein. Gauge: 18  Image saved and stored.  Normal blood return.   Patient tolerance: Patient tolerated the procedure well with  no immediate complications.       Initial Impression / Assessment and Plan / ED Course  I have reviewed the triage vital signs and the nursing notes.  Pertinent labs & imaging results that were available during my care of the patient were reviewed by me and considered in my medical decision making (see chart for details).     72 year old female here with intermittent dizziness upon standing, and mild headache.  I suspect this could be due to dehydration, but given her extensive history, CT angios ordered.  CT angios fortunately shows no acute dissection or bleed, but does show known, posterior cerebral aneurysm as well as additional areas of ectasia and aneurysmal dilatation.  These are stable from her recent IR in May, but given her dizziness, I discussed with Dr. Marjory Lies of IR who is following her.  Given her otherwise well appearance, he recommends following up with him in the clinic tomorrow, for further discussion.  The patient has had no headache or recurrence of symptoms here.  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  Serial neurological exam showed no acute abnormalities.  She has been given fluids with improvement in her symptoms.  Given her otherwise well appearance, stable imaging, and close follow-up with IR tomorrow, feels reasonable to discharge her.  She is mildly hypertensive on discharge, but has not taken her meds and has them in the car and would like to take these at home.  I discussed the importance of monitoring it closely and returning with  any recurrent headache, dizziness, vertigo, numbness, weakness, or elevated blood pressure at home.  Final Clinical Impressions(s) / ED Diagnoses   Final diagnoses:  Brain aneurysm  Dizziness    ED Discharge Orders    None       Duffy Bruce, MD 05/12/18 2343

## 2018-05-12 NOTE — ED Notes (Signed)
Called for Pt x3 for vitals. No answer.

## 2018-05-12 NOTE — ED Triage Notes (Signed)
Pt reports dizziness with headache x2 days. Symptoms continue even with hydration. She is a patient of Dr. Estanislado Pandy for brain aneurysm. She is concerned. Pt is alert and oriented at triage.

## 2018-05-13 ENCOUNTER — Ambulatory Visit (HOSPITAL_COMMUNITY)
Admission: RE | Admit: 2018-05-13 | Discharge: 2018-05-13 | Disposition: A | Discharge status: Home / Self Care | Payer: Self-pay | Source: Ambulatory Visit | Attending: Interventional Radiology | Admitting: Interventional Radiology

## 2018-05-13 ENCOUNTER — Other Ambulatory Visit (HOSPITAL_COMMUNITY): Payer: Self-pay | Admitting: Interventional Radiology

## 2018-05-13 DIAGNOSIS — R42 Dizziness and giddiness: Secondary | ICD-10-CM

## 2018-05-13 DIAGNOSIS — I671 Cerebral aneurysm, nonruptured: Secondary | ICD-10-CM

## 2018-05-13 NOTE — Progress Notes (Addendum)
Chief Complaint: Patient was seen in consultation today for dizziness x 2 weeks - ED visit on 05/12/18  Supervising Physician: Luanne Bras  Patient Status: [facility] - Out-pt  History of Present Illness: Paula [name] is a 71 y.o. female with a past medical history significant for asthma, CKD, DM II, GERD, HLD, HTN and neuropathy. 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 She was diagnosed with acute metabolic encephalopathy which was treated and resolved her AMS, although she continued to have some speech difficulties.  She was seen in consultation with Dr. Estanislado Pandy on 09/10/17 and it was decided to pursue a diagnostic angiogram to further evaluate. Diagnostic angiogram was performed on 09/15/17 by Dr. Estanislado Pandy which showed, "Approximately 5.2 mm x 3.4 mm wide-based mildly lobulated left internal carotid artery superior hypophyseal region aneurysm. 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 Approximately 50% stenosis of the left internal carotid artery petrous cavernous junction. 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 Approximately 80% stenosis at the origin of the right external carotid artery with the post stenotic dilatation."   She was again seen on 10/19/17 for follow up of diagnostic angiogram and to discuss need to follow up with PCP for DM and HTN control as well as pulmonology prior to anesthesia. 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 These findings were reviewed and it was decided at that time to continue with conservative management and patient was to have MRI/MRA 6 months later.   Patient presented to urgent care on the morning 05/12/18 with complaints of dizziness, balance issues and chronic headache - EKG and orthostatics were negative however patient was concerned she was dehydrated and needed further testing so she presented to [facility] ED later that day. 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 Dr. Ellender Hose consulted with Dr. Estanislado Pandy and decision was made for patient to return to IR today for further evaluation.  Patient presents today with female friend and daughter, she states she has dizziness for >2 weeks that is most noticeable with standing although she is unsure if it continues when she sits or lays down. She reports poor balance over the past several months with one fall on 04/24/18 - she is unsure if she experienced syncope at that time, she did not lose consciousness and she was evaluated in the ED and d/ced that same day. She reports that she has headaches "all over" most days of the week and when these occur she "needs quiet and no light" and endorses bilateral eye pain. She endorses continued slurred speech as well as trouble finding words when she is talking. She also endorses numbness and tingling at times in both of her feet. She reports fasting blood sugar 100 - 140 at home, BP around 140/80 mmHg usually. She does follow up with her PCP  regularly. She continues to take ASA 81 mg QD.   Past Medical History:  Diagnosis Date  . Allergic rhinitis   . Arthritis   . Asthma   . Brain aneurysm   . Chronic kidney disease    Stage 3  . Cough   . Diabetes mellitus   . Diastolic CHF, chronic ([place])  10/11/2011  . GERD (gastroesophageal reflux disease)   . History of colon polyps 2012   tubular adenoma   . Hyperlipidemia   . Hypertension   . Neuropathy 10/11/2011  . PONV (postoperative nausea and vomiting)    one time after lymph node surgery    Past Surgical History:  Procedure Laterality Date  . CATARACT EXTRACTION     right eye  . IR 3D INDEPENDENT WKST  12/14/2017  . IR ANGIO INTRA EXTRACRAN SEL COM CAROTID INNOMINATE BILAT MOD SED  09/15/2017  . IR ANGIO INTRA EXTRACRAN SEL COM CAROTID INNOMINATE UNI L MOD SED  12/14/2017  . IR ANGIO VERTEBRAL SEL VERTEBRAL BILAT MOD SED  09/15/2017  . IR RADIOLOGIST EVAL & MGMT  09/10/2017  . IR RADIOLOGIST EVAL & MGMT  10/19/2017  . lymphatic mass surgery    . lymphoma mass surgery Bilateral    non cancerous  . NASAL TURBINATE REDUCTION    . RADIOLOGY WITH ANESTHESIA N/A 12/14/2017   Procedure: RADIOLOGY WITH ANESTHESIA EMBOLIZATION;  Surgeon: Luanne Bras, MD;  Location: [facility];  Service: Radiology;  Laterality: N/A;  . VIDEO BRONCHOSCOPY Bilateral 11/18/2017   Procedure: VIDEO BRONCHOSCOPY WITHOUT FLUORO;  Surgeon: Collene Gobble, MD;  Location: [facility];  Service: Cardiopulmonary;  Laterality: Bilateral;    Allergies: Dust mite extract; Ace inhibitors; and Lovastatin  Medications: Prior to Admission medications   Medication Sig Start Date End Date Taking? Authorizing Provider  aspirin 81 MG tablet Take 81 mg by mouth daily.      [provider]  Blood Glucose Monitoring Suppl (ONE TOUCH ULTRA 2) w/Device KIT 1 each by Does not apply route 3 (three) times daily. 01/11/18   Charlott Rakes, MD  calcium carbonate (OSCAL) 1500 (600 Ca) MG TABS tablet Take 1,500 mg by mouth daily with breakfast.    [provider]  carvedilol (COREG) 3.125 MG tablet TAKE 1 TABLET BY MOUTH TWICE DAILY WITH A MEAL 04/15/18   Charlott Rakes, MD  Cholecalciferol (VITAMIN D PO) Take 1 tablet by mouth daily.    [provider]  EQ ALLERGY  RELIEF, CETIRIZINE, 10 MG tablet TAKE 1 TABLET BY MOUTH ONCE DAILY 04/16/18   Charlott Rakes, MD  famotidine (PEPCID) 20 MG tablet Take 1 tablet (20 mg total) by mouth 2 (two) times daily. Patient not taking: Reported on 04/24/2018 01/29/18   Collene Gobble, MD  fluticasone [facility]) 50 MCG/ACT nasal spray Place 2 sprays into both nostrils daily. Patient not taking: Reported on 05/12/2018 12/22/17   Collene Gobble, MD  gabapentin (NEURONTIN) 100 MG capsule Take 1 capsule (100 mg total) by mouth 3 (three) times daily. One three times daily Patient not taking: Reported on 04/24/2018 01/11/18   Charlott Rakes, MD  glipiZIDE (GLUCOTROL) 10 MG tablet Take 1 tablet (10 mg total) by mouth 2 (two) times daily before a meal. 04/15/18   Charlott Rakes, MD  glucose blood (ONE TOUCH ULTRA TEST) test strip Use as instructed 3 times daily before meals 01/11/18   Charlott Rakes, MD  glucose blood (TRUE METRIX BLOOD GLUCOSE TEST) test strip Use as instructed TID 10/09/17  Charlott Rakes, MD  ipratropium (ATROVENT) 0.06 % nasal spray PLACE 2 SPRAY(S) IN EACH NOSTRIL TWICE DAILY AS NEEDED FOR DRAINAGE Patient not taking: Reported on 04/24/2018 04/15/18   Charlott Rakes, MD  isosorbide mononitrate (IMDUR) 60 MG 24 hr tablet Take 1 tablet (60 mg total) by mouth daily. 01/11/18   Charlott Rakes, MD  Lancets (ONETOUCH ULTRASOFT) lancets Use as instructed 3 times daily before meals 01/11/18   Charlott Rakes, MD  levocetirizine (XYZAL) 5 MG tablet Take 1 tablet (5 mg total) by mouth every evening. Patient not taking: Reported on 01/18/2018 10/01/17   Dara Hoyer, FNP  montelukast (SINGULAIR) 10 MG tablet Take 1 tablet (10 mg total) by mouth at bedtime. Patient not taking: Reported on 01/30/2018 12/22/17   Collene Gobble, MD  pantoprazole (PROTONIX) 40 MG tablet Take 1 tablet (40 mg total) by mouth daily. Patient not taking: Reported on 01/30/2018 12/22/17   Collene Gobble, MD  Polyethyl Glycol-Propyl Glycol (SYSTANE OP) Place 1 drop  into both eyes daily as needed (dry eyes).    [provider]  rosuvastatin (CRESTOR) 5 MG tablet Take 1 tablet (5 mg total) by mouth daily. 01/11/18   Charlott Rakes, MD  verapamil (CALAN) 80 MG tablet Take 1 tablet (80 mg total) by mouth 2 (two) times daily. 01/11/18   Charlott Rakes, MD     Family History  Problem Relation Age of Onset  . Hypertension Mother   . Asthma Sister   . Colon cancer Neg Hx   . Allergic rhinitis Neg Hx   . Eczema Neg Hx     Social History   Socioeconomic History  . Marital status: Divorced    Spouse name: Not on file  . Number of children: 5  . Years of education: Not on file  . Highest education level: Master's degree (e.g., MA, MS, MEng, MEd, MSW, MBA)  Occupational History  . Occupation: Unemployed  Social Needs  . Financial resource strain: Not on file  . Food insecurity:    Worry: Not on file    Inability: Not on file  . Transportation needs:    Medical: Not on file    Non-medical: Not on file  Tobacco Use  . Smoking status: Never Smoker  . Smokeless tobacco: Never Used  Substance and Sexual Activity  . Alcohol use: No  . Drug use: No  . Sexual activity: Not Currently    Birth control/protection: None  Lifestyle  . Physical activity:    Days per week: Not on file    Minutes per session: Not on file  . Stress: Not on file  Relationships  . Social connections:    Talks on phone: Not on file    Gets together: Not on file    Attends religious service: Not on file    Active member of club or organization: Not on file    Attends meetings of clubs or organizations: Not on file    Relationship status: Not on file  Other Topics Concern  . Not on file  Social History Narrative   Phylisha lives in a 72 year old apartment with no concern for water damage or mildew.  Flooring is carpet throughout.  Heating is electric and cooling is central.  There are no animals located in the home, however, there is 1 dog across the hallway.  There  are no roaches in the home and there are no dust mite free covers on the bed or pillow.  She is exposed to  tobacco smoke in her home.  There are no concerns for exposure to fumes, chemicals, or dust in the home.      Paient is right-handed. She avoids caffeine, she walks daily. She lives alone in a first floor apartment. She had 5 children, one died in childhood, unknown causes, another as a young adult with kidney disease, in [place].     Review of Systems: A 12 point ROS discussed and pertinent positives are indicated in the HPI above.  All other systems are negative.  Review of Systems  HENT: Negative for hearing loss, tinnitus, trouble swallowing and voice change.   Eyes: Positive for photophobia (with headaches) and pain (with headaches). Negative for redness and visual disturbance.  Respiratory: Negative for shortness of breath.   Cardiovascular: Negative for chest pain.  Gastrointestinal: Negative for abdominal pain, nausea and vomiting.  Neurological: Positive for dizziness, speech difficulty (slurred), numbness (bilateral feet) and headaches. Negative for tremors, seizures, syncope, facial asymmetry, weakness and light-headedness.  Psychiatric/Behavioral: Positive for confusion (at times - forgets words and small tasks). Negative for sleep disturbance.    Vital Signs: LMP  (LMP Unknown)   Physical Exam  Constitutional: She is oriented to person, place, and time. She appears well-developed and well-nourished. No distress.  HENT:  Head: Normocephalic.  Pulmonary/Chest: Effort normal.  Abdominal: She exhibits no distension.  Neurological: She is alert and oriented to person, place, and time.  Speech does not appear dysarthric on exam today; patient is non-native english speaker and at times has trouble finding word she would like to use.   Skin: Skin is warm. She is not diaphoretic.  Psychiatric: She has a normal mood and affect. Her behavior is normal.     Imaging: Ct Angio  Head W Or Wo Contrast  Result Date: 05/12/2018 CLINICAL DATA:  72 y/o F; cerebral aneurysm, subarachnoid hemorrhage, cerebral vasospasm evaluation. EXAM: CT ANGIOGRAPHY HEAD TECHNIQUE: Multidetector CT imaging of the head was performed using the standard protocol during bolus administration of intravenous contrast. Multiplanar CT image reconstructions and MIPs were obtained to evaluate the vascular anatomy. CONTRAST:  47 cc Isovue 370 COMPARISON:  12/14/2017 and 09/15/2017 cerebral angiograms. 09/02/2017 MRA of the head. FINDINGS: CT HEAD Brain: No evidence of acute infarction, hemorrhage, hydrocephalus, extra-axial collection or mass lesion/mass effect. Stable chronic microvascular ischemic changes and volume loss of the brain. Vascular: As below. Skull: Normal. Negative for fracture or focal lesion. Sinuses: Imaged portions are clear. Orbits: No acute finding. Bilateral intra-ocular lens replacement. CTA HEAD Anterior circulation: Left ICA petrous cavernous junction 50% stenosis. Mild fusiform dilatation of proximal left ICA cavernous segment (series 13, image 107). Left posterior communicating artery wide neck saccular aneurysm measuring 3.8 x 2.7 mm (series 13, image 106 and series 16, image 23). Additionally, there is stable mild fusiform ectasia of the adjacent paraclinoid left ICA (series 13, image 105). Moderate stenosis of the right carotid siphon. No large vessel occlusion, new aneurysm, or high-grade stenosis. Posterior circulation: No significant stenosis, proximal occlusion, aneurysm, or vascular malformation. Mild mid basilar stenosis. Venous sinuses: As permitted by contrast timing, patent. Anatomic variants: Large left A1, large anterior communicating artery, hypoplastic right A1, normal variant. Delayed phase: No abnormal intracranial enhancement. IMPRESSION: CT head: 1. No acute intracranial abnormality identified. 2. Stable chronic microvascular ischemic changes and volume loss of the brain. CTA  head: 1. Stable left posterior communicating artery wide neck saccular aneurysm measuring 3.8 x 2.7 mm. 2. Stable mild fusiform ectasia of adjacent paraclinoid left ICA. 3.  Stable moderate stenosis of right carotid siphon. 4. Stable left ICA petrous cavernous junction moderate stenosis. Stable mild fusiform dilatation of left ICA proximal cavernous segment. 5. Otherwise no large vessel occlusion, additional aneurysm, or vascular malformation of Circle of Willis is identified. Electronically Signed   By: Kristine Garbe M.D.   On: 05/12/2018 22:58   Dg Thoracic Spine 2 View  Result Date: 04/24/2018 CLINICAL DATA:  Fall EXAM: THORACIC SPINE 2 VIEWS COMPARISON:  None. FINDINGS: Normal thoracic kyphosis. No evidence of fracture or dislocation. Vertebral body heights are maintained. Mild multilevel degenerative changes. Visualized lungs are clear. IMPRESSION: Negative. Electronically Signed   By: Julian Hy M.D.   On: 04/24/2018 10:51   Dg Lumbar Spine Complete  Result Date: 04/24/2018 CLINICAL DATA:  Fall EXAM: LUMBAR SPINE - COMPLETE 4+ VIEW COMPARISON:  None. FINDINGS: Normal lumbar lordosis. No evidence of fracture or dislocation. Vertebral body heights are maintained. Mild degenerative changes of the lower thoracic spine. Mild degenerative changes at L3-4. Visualized bony pelvis appears intact. IMPRESSION: No fracture or dislocation is seen. Mild degenerative changes. Electronically Signed   By: Julian Hy M.D.   On: 04/24/2018 10:51   Dg Knee Complete 4 Views Right  Result Date: 04/24/2018 CLINICAL DATA:  Fall, right knee pain EXAM: RIGHT KNEE - COMPLETE 4+ VIEW COMPARISON:  None. FINDINGS: No fracture or dislocation is seen. The joint spaces are preserved. Suprapatellar and infrapatellar enthesopathy. Visualized soft tissues are within normal limits. No suprapatellar knee joint effusion. IMPRESSION: Negative. Electronically Signed   By: Julian Hy M.D.   On: 04/24/2018  10:52    Labs:  CBC: Recent Labs    09/15/17 0846 10/12/17 1559 12/09/17 1035 05/12/18 1251 05/12/18 1429  WBC 5.1 5.6 4.6  --  6.4  HGB 12.4 12.6 12.7 13.6 13.5  HCT 35.8* 36.7 36.4 40.0 40.1  PLT 208 188.0 181  --  181    COAGS: Recent Labs    09/01/17 1551 09/15/17 0846 12/09/17 1035  INR 1.01 1.08 1.10  APTT _0 BMP: Recent Labs    09/15/17 0846 09/24/17 1214 12/09/17 1035 05/12/18 1251 05/12/18 1558  NA 141 137 141 138 137  K 3.2* 4.2 3.9 3.7 3.6  CL 102 95* 99* 99 102  CO2 _1 --  26  GLUCOSE 175* 181* 144* 158* 93  BUN _2 33* 29*  CALCIUM 9.2 9.8 9.6  --  9.4  CREATININE 1.74* 1.88* 1.58* 2.00* 1.74*  GFRNONAA 29* 27* 32*  --  28*  GFRAA 33* 31* 37*  --  33*    LIVER FUNCTION TESTS: Recent Labs    07/26/17 1555 09/01/17 1528 09/02/17 0411 09/24/17 1214  BILITOT 0.5 0.9 0.6 0.4  AST 30 34 25 18  ALT 34 _3 ALKPHOS 73 76 74 79  PROT 7.3 7.7 6.9 7.1  ALBUMIN 3.7 4.3 3.5 4.1    TUMOR MARKERS: No results for input(s): AFPTM, CEA, CA199, CHROMGRNA in the last 8760 hours.  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 Work up in ED was negative for acute issues and Dr. Estanislado Pandy was contacted regarding known aneurysm noted on CTA. Patient presents today to discuss these complaints, she continues to report headaches with some visual and auditory symptoms, she also reports slurred speech however this was not appreciated on exam  today and she endorses constant dizziness that is worse with standing. EKG and orthostatics were negative yesterday. BP and DM well controlled per patient, she states she is compliant with all medications.  Images were reviewed by Dr. Estanislado Pandy prior to visit today and he recommends repeat MRI/MRA which was discussed with patient and family today.   Plan  for follow-up includes repeat MRI/MRA brain to evaluate posterior cerebral aneurysm given new onset of symptoms.   IR scheduler to call patient with appointment for MRI/MRA.  All questions answered and concerns addressed.  Patient conveys understanding and agrees with plan.  Thank you for this interesting consult.  I greatly enjoyed meeting Paula [name] and look forward to participating in their care.  A copy of this report was sent to the requesting provider on this date.  Electronically Signed: Joaquim Nam, PA-C 05/13/2018, 3:28 PM   I spent a total of  40 Minutes in face to face in clinical consultation, greater than 50% of which was counseling/coordinating care for posterior cerebral aneurysm.

## 2018-05-17 MED FILL — VERAPAMIL 80 MG TABLET: 80 | 85 days supply | Qty: 170 | Fill #0

## 2018-05-17 MED FILL — ISOSORBIDE MN ER 60 MG TAB: 60 | 90 days supply | Qty: 90 | Fill #0

## 2018-05-21 ENCOUNTER — Ambulatory Visit: Payer: Self-pay | Attending: Family Medicine

## 2018-05-21 MED FILL — TRUE METRIX TEST STRIP: 30 days supply | Qty: 100 | Fill #5

## 2018-05-21 NOTE — Telephone Encounter (Signed)
error 

## 2018-05-26 MED FILL — IPRATROPIUM 0.06% SPRAY: 0.06 | 30 days supply | Qty: 15 | Fill #0

## 2018-05-28 ENCOUNTER — Other Ambulatory Visit: Payer: Self-pay | Admitting: Internal Medicine

## 2018-05-28 DIAGNOSIS — E119 Type 2 diabetes mellitus without complications: Secondary | ICD-10-CM

## 2018-05-28 MED ORDER — TRUE METRIX METER W/DEVICE KIT: PACK | 0 refills | Status: DC

## 2018-05-31 MED FILL — ROSUVASTATIN CALCIUM 5 MG T: 5 | 30 days supply | Qty: 30 | Fill #0

## 2018-06-02 ENCOUNTER — Encounter: Payer: Self-pay | Admitting: Family Medicine

## 2018-06-02 ENCOUNTER — Ambulatory Visit: Payer: Self-pay | Attending: Family Medicine | Admitting: Family Medicine

## 2018-06-02 VITALS — BP 153/79 | HR 64 | Temp 98.5°F | Ht 62.0 in | Wt 140.6 lb

## 2018-06-02 DIAGNOSIS — K089 Disorder of teeth and supporting structures, unspecified: Secondary | ICD-10-CM

## 2018-06-02 DIAGNOSIS — I130 Hypertensive heart and chronic kidney disease with heart failure and stage 1 through stage 4 chronic kidney disease, or unspecified chronic kidney disease: Secondary | ICD-10-CM | POA: Insufficient documentation

## 2018-06-02 DIAGNOSIS — I671 Cerebral aneurysm, nonruptured: Secondary | ICD-10-CM | POA: Insufficient documentation

## 2018-06-02 DIAGNOSIS — J45909 Unspecified asthma, uncomplicated: Secondary | ICD-10-CM | POA: Insufficient documentation

## 2018-06-02 DIAGNOSIS — I5032 Chronic diastolic (congestive) heart failure: Secondary | ICD-10-CM | POA: Insufficient documentation

## 2018-06-02 DIAGNOSIS — J302 Other seasonal allergic rhinitis: Secondary | ICD-10-CM

## 2018-06-02 DIAGNOSIS — Z79899 Other long term (current) drug therapy: Secondary | ICD-10-CM | POA: Insufficient documentation

## 2018-06-02 DIAGNOSIS — K0889 Other specified disorders of teeth and supporting structures: Secondary | ICD-10-CM | POA: Insufficient documentation

## 2018-06-02 DIAGNOSIS — M26609 Unspecified temporomandibular joint disorder, unspecified side: Secondary | ICD-10-CM | POA: Insufficient documentation

## 2018-06-02 DIAGNOSIS — N183 Chronic kidney disease, stage 3 (moderate): Secondary | ICD-10-CM | POA: Insufficient documentation

## 2018-06-02 DIAGNOSIS — E1122 Type 2 diabetes mellitus with diabetic chronic kidney disease: Secondary | ICD-10-CM | POA: Insufficient documentation

## 2018-06-02 DIAGNOSIS — G8929 Other chronic pain: Secondary | ICD-10-CM | POA: Insufficient documentation

## 2018-06-02 DIAGNOSIS — M25552 Pain in left hip: Secondary | ICD-10-CM | POA: Insufficient documentation

## 2018-06-02 DIAGNOSIS — E7800 Pure hypercholesterolemia, unspecified: Secondary | ICD-10-CM | POA: Insufficient documentation

## 2018-06-02 DIAGNOSIS — E1140 Type 2 diabetes mellitus with diabetic neuropathy, unspecified: Secondary | ICD-10-CM | POA: Insufficient documentation

## 2018-06-02 DIAGNOSIS — E119 Type 2 diabetes mellitus without complications: Secondary | ICD-10-CM

## 2018-06-02 DIAGNOSIS — J3089 Other allergic rhinitis: Secondary | ICD-10-CM

## 2018-06-02 DIAGNOSIS — Z7984 Long term (current) use of oral hypoglycemic drugs: Secondary | ICD-10-CM | POA: Insufficient documentation

## 2018-06-02 DIAGNOSIS — K219 Gastro-esophageal reflux disease without esophagitis: Secondary | ICD-10-CM | POA: Insufficient documentation

## 2018-06-02 DIAGNOSIS — M791 Myalgia, unspecified site: Secondary | ICD-10-CM | POA: Insufficient documentation

## 2018-06-02 DIAGNOSIS — Z7982 Long term (current) use of aspirin: Secondary | ICD-10-CM | POA: Insufficient documentation

## 2018-06-02 LAB — GLUCOSE, POCT (MANUAL RESULT ENTRY): POC Glucose: 197 mg/dl — AB (ref 70–99)

## 2018-06-02 MED ORDER — CETIRIZINE HCL 10 MG PO TABS: 10.0000 mg | ORAL_TABLET | Freq: Every day | ORAL | 1 refills | Status: DC

## 2018-06-02 MED ORDER — LIDOCAINE 5 % EX PTCH: 1.0000 | MEDICATED_PATCH | CUTANEOUS | 1 refills | Status: DC

## 2018-06-02 MED ORDER — DICLOFENAC SODIUM 1 % TD GEL: 4.0000 g | Freq: Four times a day (QID) | TRANSDERMAL | 3 refills | Status: DC

## 2018-06-02 MED FILL — DICLOFENAC SODIUM 1% GEL: 1 | 6 days supply | Qty: 100 | Fill #0

## 2018-06-02 MED FILL — ?LIDOCAINE 5% PATCH: 5 | 30 days supply | Qty: 30 | Fill #0

## 2018-06-02 NOTE — Patient Instructions (Signed)

## 2018-06-02 NOTE — Progress Notes (Signed)
Subjective:  Patient ID: Paula [name], female    DOB: 1946-01-18  Age: 72 y.o. 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 She complains she had myalgias and arthralgia 2 weeks ago without fever but these have resolved but she has persisting left hip pain which does not radiate down her left lower extremity. She had an ED visit for a fall last month and also 3 weeks ago for dizziness. CT angiogram of the brain revealed PCA aneurysm and she was advised to follow-up with interventional radiology Dr. Estanislado Pandy whom she usually sees on a regular basis.  She is yet to make an appointment.  At this time dizziness has resolved and she has not had any recent falls since the last one from last month. She would like to see dentist as she has chronic dental pain and grinds her teeth in her sleep. Also complains her cough has worsened.  She has seen ENT, allergy and asthma center, pulmonary due to her chronic rhinitis and upper airway syndrome.  She has been compliant with her nasal spray.  Past Medical History:  Diagnosis Date  . Allergic rhinitis   . Arthritis   . Asthma   . Brain aneurysm   . Chronic kidney disease    Stage 3  . Cough   . Diabetes mellitus   . Diastolic CHF, chronic ([place]) 10/11/2011  . GERD (gastroesophageal reflux disease)   . History of colon polyps 2012   tubular adenoma   . Hyperlipidemia   . Hypertension   . Neuropathy 10/11/2011  . PONV (postoperative nausea and vomiting)    one time after lymph node surgery    Past Surgical History:  Procedure Laterality Date  . CATARACT EXTRACTION     right eye  . IR 3D INDEPENDENT WKST  12/14/2017  . IR ANGIO INTRA EXTRACRAN SEL COM CAROTID INNOMINATE BILAT MOD SED  09/15/2017  . IR ANGIO INTRA EXTRACRAN SEL COM CAROTID INNOMINATE UNI L MOD SED  12/14/2017  . IR ANGIO VERTEBRAL SEL VERTEBRAL BILAT MOD SED  09/15/2017  . IR RADIOLOGIST EVAL & MGMT  09/10/2017  . IR RADIOLOGIST EVAL & MGMT  10/19/2017  . lymphatic mass surgery    . lymphoma mass surgery Bilateral    non cancerous  . NASAL TURBINATE REDUCTION    . RADIOLOGY WITH ANESTHESIA N/A 12/14/2017   Procedure: RADIOLOGY WITH ANESTHESIA EMBOLIZATION;  Surgeon: Luanne Bras, MD;  Location: [facility];  Service: Radiology;  Laterality: N/A;  . VIDEO BRONCHOSCOPY Bilateral 11/18/2017   Procedure: VIDEO BRONCHOSCOPY WITHOUT FLUORO;  Surgeon: Collene Gobble, MD;  Location: [facility];  Service: Cardiopulmonary;  Laterality: Bilateral;    Allergies  Allergen Reactions  . Dust Mite Extract Cough  . Ace Inhibitors Cough    chronic cough  . Lovastatin Other (See Comments)    Generalized body pain     Outpatient Medications Prior to Visit  Medication Sig Dispense Refill  . aspirin 81 MG tablet Take 81 mg by mouth daily.      . Blood Glucose Monitoring Suppl (TRUE METRIX METER) w/Device KIT Use as directed once daily 1 kit 0  . calcium  carbonate (OSCAL) 1500 (600 Ca) MG TABS tablet Take 1,500 mg by mouth daily with breakfast.    . carvedilol (COREG) 3.125 MG tablet TAKE 1 TABLET BY MOUTH TWICE DAILY WITH A MEAL 180 tablet 1  . Cholecalciferol (VITAMIN D PO) Take 1 tablet by mouth daily.    . famotidine (PEPCID) 20 MG tablet Take 1 tablet (20 mg total) by mouth 2 (two) times daily. 60 tablet 1  . fluticasone (FLONASE) 50 MCG/ACT nasal spray Place 2 sprays into both nostrils daily. 16 g 5  . glipiZIDE (GLUCOTROL) 10 MG tablet Take 1 tablet (10 mg total) by mouth 2 (two) times daily before a meal. 180 tablet 1  . glucose blood (ONE TOUCH ULTRA TEST) test strip Use as instructed 3 times daily  before meals 100 each 12  . glucose blood (TRUE METRIX BLOOD GLUCOSE TEST) test strip Use as instructed TID 100 each 12  . ipratropium (ATROVENT) 0.06 % nasal spray PLACE 2 SPRAY(S) IN EACH NOSTRIL TWICE DAILY AS NEEDED FOR DRAINAGE 15 mL 2  . isosorbide mononitrate (IMDUR) 60 MG 24 hr tablet Take 1 tablet (60 mg total) by mouth daily. 90 tablet 1  . Lancets (ONETOUCH ULTRASOFT) lancets Use as instructed 3 times daily before meals 100 each 12  . montelukast (SINGULAIR) 10 MG tablet Take 1 tablet (10 mg total) by mouth at bedtime. 30 tablet 5  . pantoprazole (PROTONIX) 40 MG tablet Take 1 tablet (40 mg total) by mouth daily. 30 tablet 5  . Polyethyl Glycol-Propyl Glycol (SYSTANE OP) Place 1 drop into both eyes daily as needed (dry eyes).    . rosuvastatin (CRESTOR) 5 MG tablet Take 1 tablet (5 mg total) by mouth daily. 90 tablet 1  . verapamil (CALAN) 80 MG tablet Take 1 tablet (80 mg total) by mouth 2 (two) times daily. 180 tablet 1  . EQ ALLERGY RELIEF, CETIRIZINE, 10 MG tablet TAKE 1 TABLET BY MOUTH ONCE DAILY 90 tablet 0  . gabapentin (NEURONTIN) 100 MG capsule Take 1 capsule (100 mg total) by mouth 3 (three) times daily. One three times daily (Patient not taking: Reported on 04/24/2018) 90 capsule 2  . levocetirizine (XYZAL) 5 MG tablet Take 1 tablet (5 mg total) by mouth every evening. (Patient not taking: Reported on 01/18/2018) 30 tablet 5   No facility-administered medications prior to visit.     ROS Review of Systems  Constitutional: Negative for activity change, appetite change and fatigue.  HENT: Positive for dental problem. Negative for congestion, sinus pressure and sore throat.   Eyes: Negative for visual disturbance.  Respiratory: Negative for cough, chest tightness, shortness of breath and wheezing.   Cardiovascular: Negative for chest pain and palpitations.  Gastrointestinal: Negative for abdominal distention, abdominal pain and constipation.  Endocrine: Negative for  polydipsia.  Genitourinary: Negative for dysuria and frequency.  Musculoskeletal:       See hpi  Skin: Negative for rash.  Neurological: Negative for tremors, light-headedness and numbness.  Hematological: Does not bruise/bleed easily.  Psychiatric/Behavioral: Negative for agitation and behavioral problems.    Objective:  BP (!) 153/79   Pulse 64   Temp 98.5 F (36.9 C) (Oral)   Ht 5' 2"  (1.575 m)   Wt 140 lb 9.6 oz (63.8 kg)   LMP  (LMP Unknown)   SpO2 98%   BMI 25.72 kg/m   BP/Weight 06/02/2018 05/12/2018 74/08/4237  Systolic BP 532 023 343  Diastolic BP 79 97 70  Wt. (Lbs) 140.6 - -  BMI 25.72 - -      Physical Exam  Constitutional: She is oriented to person, place, and time. She appears well-developed and well-nourished.  HENT:  Right Ear: External ear normal.  Left Ear: External ear normal.  Postnasal drip  Cardiovascular: Normal rate, normal heart sounds and intact distal pulses.  No murmur heard. Pulmonary/Chest: Effort normal and breath sounds normal. She has no wheezes. She has no rales. She exhibits no tenderness.  Abdominal: Soft. Bowel sounds are normal. She exhibits no distension and no mass. There is no tenderness.  Musculoskeletal: Normal range of motion.  Slight tenderness on range of motion of left hip  Neurological: She is alert and oriented to person, place, and time.  Skin: Skin is warm and dry.  Psychiatric: She has a normal mood and affect.    Lab Results  Component Value Date   HGBA1C 6.7 04/15/2018    Assessment & Plan:   1. Type 2 diabetes mellitus without complication, without long-term current use of insulin (HCC) Controlled with A1c of 6.7 Continue current regimen - POCT glucose (manual entry)  2. TMJ dysfunction Unable to place on NSAIDs due to CKD She could use Voltaren gel Referred to dentist  3. Chronic dental pain - Ambulatory referral to Dentistry  4. Pain of left hip joint Likely due to underlying osteoarthritis -  diclofenac sodium (VOLTAREN) 1 % GEL; Apply 4 g topically 4 (four) times daily.  Dispense: 100 g; Refill: 3 - lidocaine (LIDODERM) 5 %; Place 1 patch onto the skin daily. Remove & Discard patch within 12 hours or as directed by MD  Dispense: 30 patch; Refill: 1  5. Seasonal and perennial allergic rhinitis Uncontrolled on current regimen We will add Zyrtec to regimen - cetirizine (ZYRTEC) 10 MG tablet; Take 1 tablet (10 mg total) by mouth daily.  Dispense: 30 tablet; Refill: 1   Meds ordered this encounter  Medications  . diclofenac sodium (VOLTAREN) 1 % GEL    Sig: Apply 4 g topically 4 (four) times daily.    Dispense:  100 g    Refill:  3  . cetirizine (ZYRTEC) 10 MG tablet    Sig: Take 1 tablet (10 mg total) by mouth daily.    Dispense:  30 tablet    Refill:  1  . lidocaine (LIDODERM) 5 %    Sig: Place 1 patch onto the skin daily. Remove & Discard patch within 12 hours or as directed by MD    Dispense:  30 patch    Refill:  1    Follow-up: Return for Follow-up of chronic medical conditions, keep previously scheduled appointment.   Charlott Rakes MD

## 2018-06-04 ENCOUNTER — Telehealth (HOSPITAL_COMMUNITY): Payer: Self-pay

## 2018-06-23 ENCOUNTER — Ambulatory Visit (INDEPENDENT_AMBULATORY_CARE_PROVIDER_SITE_OTHER): Payer: Self-pay | Admitting: Emergency Medicine

## 2018-06-23 ENCOUNTER — Encounter: Payer: Self-pay | Admitting: Emergency Medicine

## 2018-06-23 DIAGNOSIS — R05 Cough: Secondary | ICD-10-CM

## 2018-06-23 DIAGNOSIS — R059 Cough, unspecified: Secondary | ICD-10-CM

## 2018-06-23 NOTE — Progress Notes (Signed)
 Subjective:    Patient ID: Paula [name], female    DOB: 03/30/1946, 72 y.o.   MRN: [id number]  Cough  Associated symptoms include postnasal drip, a sore throat, shortness of breath and wheezing. Pertinent negatives include no rash.   72-year-old never smoker with a history of diabetes, hypertension with diastolic dysfunction, and chronic cough previously followed in our office by Dr. Wright.  She has a history of allergic rhinitis and GERD and it was felt that these were the most significant contributor to her coughing on her past evaluations.  She returns today describing an increase nasal drainage, cough that started about 2.5 weeks ago when she came off tramadol. She does not want to restart it. She is no longer on nasonex, was on an alternative nasal steroid that she stopped a month ago. She is no longer on a PPI.  Her cough is productive of clear mucous, she has a lot of nasal congestion.   ROV 11/16/17 --follow-up visit for patient with a history of diabetes, hypertension with diastolic dysfunction.  We have followed her for chronic cough in the setting of allergic rhinitis and GERD. I had her on nasal saline, protonix but she stopped. She used to benefit from tramadol, but this was stopped.   She was seen here about 1 month ago for upper airway irritation, globus sensation, and flaring of her cough.   She was started on chlorpheniramine as needed, Zantac, and was given gabapentin 100 mg 3 times daily. She apparently is no longer on these, if she took them at all.   She was already on Xyzal. CXR was done last time > no infiltrates noted. Planning for IR treatment brain aneurism w Dr Deveshwar, needs surgical pre-op eval  ROV 12/22/17 --this is a follow-up visit for patient with a history of diabetes, hypertension and chronic cough.  I saw her approximately 1 month ago and her cough was quite active.  I continued her on chlorpheniramine, Xyzal.  We performed bronchoscopy on 11/18/2017 to  perform an upper airway evaluation which showed a very crowded larynx with some edema.  No significant cobblestoning or lesion noted.  The vocal cords and glottis were normal.  The rest of the entire airway exam was normal as well.  Bacterial culture, fungal and AFB smears are negative.  Cell count showed 43 white cells (43% neutrophils, 14% lymphocytes, 82% monocytes).  Cytology was normal.  She underwent pulmonary function testing today that I have reviewed.  This shows normal air flows without a bronchodilator response, normal lung volumes and a slightly decreased diffusion capacity that corrects to the normal range when it was adjusted for alveolar volume.  Her flow volume loops are grossly normal.  She still feels nasal gtt to her throat. She is taking both zyrtec and xyzal, but not singulair. She is not doing any nasal sprays or saline. Not currently on pantoprazole either.   ROV 06/23/18 --72-year-old woman with a history of diabetes and hypertension.  Seen here for chronic cough with contributions from chronic upper airway irritation, allergic rhinitis, GERD.  She had reassuring PFT.  Bronchoscopy showed upper airway erythema. She restarted zyrtec, flonase (recently ran out). ? atrovent or singulair or protonix >> she doesn't know. No longer on gabapentin, did not help her. She has seen ENT - reassuring eval. She was recommended to go to cough behavioral therapy, but hasn't done so to date.    Review of Systems  HENT: Positive for congestion, postnasal drip, sinus pressure,   sneezing and sore throat.   Respiratory: Positive for cough, chest tightness, shortness of breath and wheezing.   Cardiovascular: Positive for palpitations.  Skin: Negative for rash.  Allergic/Immunologic: Negative.   Psychiatric/Behavioral: The patient is nervous/anxious.     Past Medical History:  Diagnosis Date  . Allergic rhinitis   . Arthritis   . Asthma   . Brain aneurysm   . Chronic kidney disease    Stage 3    . Cough   . Diabetes mellitus   . Diastolic CHF, chronic ([facility]) 10/11/2011  . GERD (gastroesophageal reflux disease)   . History of colon polyps 2012   tubular adenoma   . Hyperlipidemia   . Hypertension   . Neuropathy 10/11/2011  . PONV (postoperative nausea and vomiting)    one time after lymph node surgery     Family History  Problem Relation Age of Onset  . Hypertension Mother   . Asthma Sister   . Colon cancer Neg Hx   . Allergic rhinitis Neg Hx   . Eczema Neg Hx      Social History   Socioeconomic History  . Marital status: Divorced    Spouse name: Not on file  . Number of children: 5  . Years of education: Not on file  . Highest education level: Master's degree (e.g., MA, MS, MEng, MEd, MSW, MBA)  Occupational History  . Occupation: Unemployed  Social Needs  . Financial resource strain: Not on file  . Food insecurity:    Worry: Not on file    Inability: Not on file  . Transportation needs:    Medical: Not on file    Non-medical: Not on file  Tobacco Use  . Smoking status: Never Smoker  . Smokeless tobacco: Never Used  Substance and Sexual Activity  . Alcohol use: No  . Drug use: No  . Sexual activity: Not Currently    Birth control/protection: None  Lifestyle  . Physical activity:    Days per week: Not on file    Minutes per session: Not on file  . Stress: Not on file  Relationships  . Social connections:    Talks on phone: Not on file    Gets together: Not on file    Attends religious service: Not on file    Active member of club or organization: Not on file    Attends meetings of clubs or organizations: Not on file    Relationship status: Not on file  . Intimate partner violence:    Fear of current or ex partner: Not on file    Emotionally abused: Not on file    Physically abused: Not on file    Forced sexual activity: Not on file  Other Topics Concern  . Not on file  Social History Narrative   Fendi lives in a 34-year-old apartment with no  concern for water damage or mildew.  Flooring is carpet throughout.  Heating is electric and cooling is central.  There are no animals located in the home, however, there is 1 dog across the hallway.  There are no roaches in the home and there are no dust mite free covers on the bed or pillow.  She is exposed to tobacco smoke in her home.  There are no concerns for exposure to fumes, chemicals, or dust in the home.      Paient is right-handed. She avoids caffeine, she walks daily. She lives alone in a first floor apartment. She had 5 children, one died   in childhood, unknown causes, another as a young adult with kidney disease, in [place].  Originally from [name], has lived [place], [place], [place] Has worked as Education officer, museum   Allergies  Allergen Reactions  . Dust Mite Extract Cough  . Ace Inhibitors Cough    chronic cough  . Lovastatin Other (See Comments)    Generalized body pain     Outpatient Medications Prior to Visit  Medication Sig Dispense Refill  . aspirin 81 MG tablet Take 81 mg by mouth daily.      . Blood Glucose Monitoring Suppl (TRUE METRIX METER) w/Device KIT Use as directed once daily 1 kit 0  . calcium carbonate (OSCAL) 1500 (600 Ca) MG TABS tablet Take 1,500 mg by mouth daily with breakfast.    . carvedilol (COREG) 3.125 MG tablet TAKE 1 TABLET BY MOUTH TWICE DAILY WITH A MEAL 180 tablet 1  . cetirizine (ZYRTEC) 10 MG tablet Take 1 tablet (10 mg total) by mouth daily. 30 tablet 1  . Cholecalciferol (VITAMIN D PO) Take 1 tablet by mouth daily.    . diclofenac sodium (VOLTAREN) 1 % GEL Apply 4 g topically 4 (four) times daily. 100 g 3  . famotidine (PEPCID) 20 MG tablet Take 1 tablet (20 mg total) by mouth 2 (two) times daily. 60 tablet 1  . fluticasone (FLONASE) 50 MCG/ACT nasal spray Place 2 sprays into both nostrils daily. 16 g 5  . glipiZIDE (GLUCOTROL) 10 MG tablet Take 1 tablet (10 mg total) by mouth 2 (two) times daily before a meal. 180 tablet 1  . glucose blood (ONE TOUCH ULTRA  TEST) test strip Use as instructed 3 times daily before meals 100 each 12  . glucose blood (TRUE METRIX BLOOD GLUCOSE TEST) test strip Use as instructed TID 100 each 12  . ipratropium (ATROVENT) 0.06 % nasal spray PLACE 2 SPRAY(S) IN EACH NOSTRIL TWICE DAILY AS NEEDED FOR DRAINAGE 15 mL 2  . isosorbide mononitrate (IMDUR) 60 MG 24 hr tablet Take 1 tablet (60 mg total) by mouth daily. 90 tablet 1  . Lancets (ONETOUCH ULTRASOFT) lancets Use as instructed 3 times daily before meals 100 each 12  . lidocaine (LIDODERM) 5 % Place 1 patch onto the skin daily. Remove & Discard patch within 12 hours or as directed by MD 30 patch 1  . montelukast (SINGULAIR) 10 MG tablet Take 1 tablet (10 mg total) by mouth at bedtime. 30 tablet 5  . pantoprazole (PROTONIX) 40 MG tablet Take 1 tablet (40 mg total) by mouth daily. 30 tablet 5  . Polyethyl Glycol-Propyl Glycol (SYSTANE OP) Place 1 drop into both eyes daily as needed (dry eyes).    . rosuvastatin (CRESTOR) 5 MG tablet Take 1 tablet (5 mg total) by mouth daily. 90 tablet 1  . verapamil (CALAN) 80 MG tablet Take 1 tablet (80 mg total) by mouth 2 (two) times daily. 180 tablet 1  . gabapentin (NEURONTIN) 100 MG capsule Take 1 capsule (100 mg total) by mouth 3 (three) times daily. One three times daily (Patient not taking: Reported on 04/24/2018) 90 capsule 2  . levocetirizine (XYZAL) 5 MG tablet Take 1 tablet (5 mg total) by mouth every evening. (Patient not taking: Reported on 01/18/2018) 30 tablet 5   No facility-administered medications prior to visit.         Objective:   Physical Exam  Vitals:   06/23/18 1053  BP: 130/80  Pulse: 64  SpO2: 97%  Weight: 153 lb (69.4 kg)  Height: 5' 2" (1.575 m)   Gen: Pleasant, well-nourished, in no distress,  normal affect  ENT: No lesions,  mouth clear,  oropharynx clear, no postnasal drip but some apparent congestion  Neck: No JVD, no stridor  Lungs: No use of accessory muscles, no wheezing, no  crackles  Cardiovascular: RRR, heart sounds normal, no murmur or gallops, no peripheral edema  Musculoskeletal: No deformities, no cyanosis or clubbing  Neuro: alert, non focal  Skin: Warm, no lesions or rashes                                                                           Assessment & Plan:  Cough Long-standing.  Again her precipitating factors have been identified but I also think there is a behavioral component and she needs cough behavioral therapy.  The ENT evaluation at [facility] agrees.  Difficult to tell which medication she is actually taking based on her history giving.  We have ruled out any significant pulmonary pathology.  Your pulmonary function testing and a chest imaging have both been reassuring. Your bronchoscopy has been reassuring as well but does show some throat irritation. You would benefit from continuing your medications for allergic nasal drainage including Zyrtec, Flonase nasal spray.  He probably also benefit from restarting your Singulair You may benefit from being on a stomach acid medication, previously you were on Protonix. Agree with ENT evaluations that are recommended cough behavioral therapy to help reduce your cough and keep it from being so intrusive in your life. Follow with Dr Byrum if needed  Robert Byrum, MD, PhD 06/23/2018, 11:24 AM [facility] [phone] or if no answer [phone]  

## 2018-06-23 NOTE — Patient Instructions (Signed)
Your pulmonary function testing and a chest imaging have both been reassuring. Your bronchoscopy has been reassuring as well but does show some throat irritation. You would benefit from continuing your medications for allergic nasal drainage including Zyrtec, Flonase nasal spray.  He probably also benefit from restarting your Singulair You may benefit from being on a stomach acid medication, previously you were on Protonix. Agree with ENT evaluations that are recommended cough behavioral therapy to help reduce your cough and keep it from being so intrusive in your life. Follow with Dr Lamonte Sakai if needed

## 2018-06-23 NOTE — Assessment & Plan Note (Signed)
Long-standing.  Again her precipitating factors have been identified but I also think there is a behavioral component and she needs cough behavioral therapy.  The ENT evaluation at [facility] agrees.  Difficult to tell which medication she is actually taking based on her history giving.  We have ruled out any significant pulmonary pathology.  Your pulmonary function testing and a chest imaging have both been reassuring. Your bronchoscopy has been reassuring as well but does show some throat irritation. You would benefit from continuing your medications for allergic nasal drainage including Zyrtec, Flonase nasal spray.  He probably also benefit from restarting your Singulair You may benefit from being on a stomach acid medication, previously you were on Protonix. Agree with ENT evaluations that are recommended cough behavioral therapy to help reduce your cough and keep it from being so intrusive in your life. Follow with Dr Lamonte Sakai if needed

## 2018-06-24 ENCOUNTER — Telehealth: Payer: Self-pay | Admitting: Family Medicine

## 2018-06-24 NOTE — Telephone Encounter (Signed)
Pt called to request an update on her CAFA letter please follow up

## 2018-06-24 NOTE — Telephone Encounter (Signed)
I spoke with Pt about her CAFA that has been approve

## 2018-06-25 MED FILL — IPRATROPIUM 0.06% SPRAY: 0.06 | 30 days supply | Qty: 15 | Fill #1

## 2018-06-25 MED FILL — ROSUVASTATIN CALCIUM 5 MG T: 5 | 30 days supply | Qty: 30 | Fill #1

## 2018-06-25 MED FILL — glipiZIDE 10 MG TABS: 10 | 90 days supply | Qty: 180 | Fill #0

## 2018-06-29 MED FILL — TRUE METRIX TEST STRIP: 30 days supply | Qty: 100 | Fill #6

## 2018-07-13 ENCOUNTER — Encounter: Payer: Self-pay | Admitting: Family Medicine

## 2018-07-13 ENCOUNTER — Ambulatory Visit: Payer: Self-pay | Attending: Family Medicine | Admitting: Family Medicine

## 2018-07-13 VITALS — BP 130/80 | HR 66 | Temp 98.3°F | Ht 62.0 in

## 2018-07-13 DIAGNOSIS — Z7982 Long term (current) use of aspirin: Secondary | ICD-10-CM | POA: Insufficient documentation

## 2018-07-13 DIAGNOSIS — R05 Cough: Secondary | ICD-10-CM

## 2018-07-13 DIAGNOSIS — J45909 Unspecified asthma, uncomplicated: Secondary | ICD-10-CM | POA: Insufficient documentation

## 2018-07-13 DIAGNOSIS — Z7984 Long term (current) use of oral hypoglycemic drugs: Secondary | ICD-10-CM | POA: Insufficient documentation

## 2018-07-13 DIAGNOSIS — J329 Chronic sinusitis, unspecified: Secondary | ICD-10-CM | POA: Insufficient documentation

## 2018-07-13 DIAGNOSIS — K219 Gastro-esophageal reflux disease without esophagitis: Secondary | ICD-10-CM | POA: Insufficient documentation

## 2018-07-13 DIAGNOSIS — E1169 Type 2 diabetes mellitus with other specified complication: Secondary | ICD-10-CM

## 2018-07-13 DIAGNOSIS — G8929 Other chronic pain: Secondary | ICD-10-CM

## 2018-07-13 DIAGNOSIS — R51 Headache: Secondary | ICD-10-CM

## 2018-07-13 DIAGNOSIS — E7800 Pure hypercholesterolemia, unspecified: Secondary | ICD-10-CM

## 2018-07-13 DIAGNOSIS — E785 Hyperlipidemia, unspecified: Secondary | ICD-10-CM | POA: Insufficient documentation

## 2018-07-13 DIAGNOSIS — I130 Hypertensive heart and chronic kidney disease with heart failure and stage 1 through stage 4 chronic kidney disease, or unspecified chronic kidney disease: Secondary | ICD-10-CM | POA: Insufficient documentation

## 2018-07-13 DIAGNOSIS — R519 Headache, unspecified: Secondary | ICD-10-CM

## 2018-07-13 DIAGNOSIS — E119 Type 2 diabetes mellitus without complications: Secondary | ICD-10-CM

## 2018-07-13 DIAGNOSIS — N183 Chronic kidney disease, stage 3 (moderate): Secondary | ICD-10-CM | POA: Insufficient documentation

## 2018-07-13 DIAGNOSIS — I5032 Chronic diastolic (congestive) heart failure: Secondary | ICD-10-CM | POA: Insufficient documentation

## 2018-07-13 DIAGNOSIS — I10 Essential (primary) hypertension: Secondary | ICD-10-CM

## 2018-07-13 DIAGNOSIS — Z79899 Other long term (current) drug therapy: Secondary | ICD-10-CM | POA: Insufficient documentation

## 2018-07-13 DIAGNOSIS — E1140 Type 2 diabetes mellitus with diabetic neuropathy, unspecified: Secondary | ICD-10-CM | POA: Insufficient documentation

## 2018-07-13 DIAGNOSIS — E1122 Type 2 diabetes mellitus with diabetic chronic kidney disease: Secondary | ICD-10-CM | POA: Insufficient documentation

## 2018-07-13 DIAGNOSIS — J3089 Other allergic rhinitis: Secondary | ICD-10-CM

## 2018-07-13 DIAGNOSIS — J328 Other chronic sinusitis: Secondary | ICD-10-CM

## 2018-07-13 DIAGNOSIS — J302 Other seasonal allergic rhinitis: Secondary | ICD-10-CM

## 2018-07-13 DIAGNOSIS — R058 Other specified cough: Secondary | ICD-10-CM

## 2018-07-13 LAB — POCT GLYCOSYLATED HEMOGLOBIN (HGB A1C): HbA1c, POC (controlled diabetic range): 6.3 % (ref 0.0–7.0)

## 2018-07-13 LAB — GLUCOSE, POCT (MANUAL RESULT ENTRY): POC Glucose: 153 mg/dl — AB (ref 70–99)

## 2018-07-13 MED ORDER — ROSUVASTATIN CALCIUM 5 MG PO TABS: 5.0000 mg | ORAL_TABLET | Freq: Every day | ORAL | 1 refills | Status: DC

## 2018-07-13 MED ORDER — VERAPAMIL HCL 80 MG PO TABS: 80.0000 mg | ORAL_TABLET | Freq: Two times a day (BID) | ORAL | 1 refills | Status: DC

## 2018-07-13 MED ORDER — MONTELUKAST SODIUM 10 MG PO TABS: 10.0000 mg | ORAL_TABLET | Freq: Every day | ORAL | 5 refills | Status: DC

## 2018-07-13 MED ORDER — CARVEDILOL 3.125 MG PO TABS: ORAL_TABLET | ORAL | 1 refills | Status: DC

## 2018-07-13 MED ORDER — GLIPIZIDE 10 MG PO TABS: 10.0000 mg | ORAL_TABLET | Freq: Two times a day (BID) | ORAL | 1 refills | Status: DC

## 2018-07-13 MED ORDER — PANTOPRAZOLE SODIUM 40 MG PO TBEC: 40.0000 mg | DELAYED_RELEASE_TABLET | Freq: Every day | ORAL | 5 refills | Status: DC

## 2018-07-13 MED ORDER — IPRATROPIUM BROMIDE 0.06 % NA SOLN: NASAL | 2 refills | Status: DC

## 2018-07-13 MED ORDER — CETIRIZINE HCL 10 MG PO TABS: 10.0000 mg | ORAL_TABLET | Freq: Every day | ORAL | 1 refills | Status: DC

## 2018-07-13 MED ORDER — ISOSORBIDE MONONITRATE ER 60 MG PO TB24: 60.0000 mg | ORAL_TABLET | Freq: Every day | ORAL | 1 refills | Status: DC

## 2018-07-13 MED FILL — CARVEDILOL 3.125 MG TABLET: 3.125 | 90 days supply | Qty: 180 | Fill #0

## 2018-07-13 NOTE — Progress Notes (Signed)
Patient has a cough that is making her head hurt.

## 2018-07-14 NOTE — Progress Notes (Signed)
Subjective:  Patient ID: Paula [name], female    DOB: May 01, 1946  Age: 72 y.o. 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 She complains of persisting cough and postnasal drip which she states she has had for >20 years; informs me "I want my head to be looked at".  She also has headaches on the right side of her head.  Denies nausea, vomiting.  Cough is productive of clear mucus from her mouth and nostrils. On informing her she had a CT angiogram of her head one month ago she is unable to recall this.  She has been evaluated by ENT and also by pulmonary with a  bronchoscopy and PFT; cough behavioral therapy recommended which she has been unable to obtain due to lack of transportation. "All my medications do not work" she states.  She is currently on antihistamines, Atrovent nasal spray, PPI.  CTA of the head 05/12/2018: IMPRESSION: CT head:  1. No acute intracranial abnormality identified. 2. Stable chronic microvascular ischemic changes and volume loss of the brain.  CTA head:  1. Stable left posterior communicating artery wide neck saccular aneurysm measuring 3.8 x 2.7 mm. 2. Stable mild fusiform ectasia of adjacent paraclinoid left ICA. 3. Stable moderate stenosis of right carotid siphon. 4. Stable left ICA petrous cavernous junction moderate stenosis. Stable mild fusiform dilatation of left ICA proximal cavernous segment. 5. Otherwise no large vessel occlusion, additional aneurysm, or vascular malformation of Circle of Willis is identified.   Her  diabetes is controlled and she denies hypoglycemia, numbness in extremities or visual concerns.  Past Medical History:  Diagnosis Date  . Allergic rhinitis   . Arthritis   . Asthma   . Brain aneurysm   . Chronic kidney disease    Stage 3  . Cough   . Diabetes mellitus   . Diastolic CHF, chronic ([place]) 10/11/2011  . GERD (gastroesophageal reflux disease)   . History of colon polyps 2012   tubular adenoma   . Hyperlipidemia   . Hypertension   . Neuropathy 10/11/2011  . PONV (postoperative nausea and vomiting)    one time after lymph node surgery    Past Surgical History:  Procedure Laterality Date  . CATARACT EXTRACTION     right eye  . IR 3D INDEPENDENT WKST  12/14/2017  . IR ANGIO INTRA EXTRACRAN SEL COM CAROTID INNOMINATE BILAT MOD SED  09/15/2017  . IR ANGIO INTRA EXTRACRAN SEL COM CAROTID INNOMINATE UNI L MOD SED  12/14/2017  . IR ANGIO VERTEBRAL SEL VERTEBRAL BILAT MOD SED  09/15/2017  . IR RADIOLOGIST EVAL & MGMT  09/10/2017  . IR RADIOLOGIST EVAL & MGMT  10/19/2017  . lymphatic mass surgery    . lymphoma mass surgery Bilateral    non cancerous  . NASAL TURBINATE REDUCTION    . RADIOLOGY WITH ANESTHESIA N/A 12/14/2017   Procedure: RADIOLOGY WITH ANESTHESIA EMBOLIZATION;  Surgeon: Luanne Bras, MD;  Location: [facility];  Service: Radiology;  Laterality: N/A;  . VIDEO BRONCHOSCOPY Bilateral 11/18/2017   Procedure: VIDEO BRONCHOSCOPY WITHOUT FLUORO;  Surgeon: Collene Gobble, MD;  Location: [facility];  Service: Cardiopulmonary;  Laterality: Bilateral;    Allergies  Allergen Reactions  . Dust Mite Extract Cough  . Ace Inhibitors Cough    chronic cough  . Lovastatin Other (See Comments)    Generalized body pain     Outpatient Medications Prior to Visit  Medication Sig Dispense Refill  . aspirin 81 MG tablet Take 81 mg by mouth daily.      . Blood Glucose Monitoring Suppl (TRUE METRIX METER) w/Device KIT Use as directed once daily 1 kit 0  . calcium carbonate (OSCAL) 1500  (600 Ca) MG TABS tablet Take 1,500 mg by mouth daily with breakfast.    . Cholecalciferol (VITAMIN D PO) Take 1 tablet by mouth daily.    . diclofenac sodium (VOLTAREN) 1 % GEL Apply 4 g topically 4 (four) times daily. 100 g 3  . famotidine (PEPCID) 20 MG tablet Take 1 tablet (20 mg total) by mouth 2 (two) times daily. 60 tablet 1  . fluticasone (FLONASE) 50 MCG/ACT nasal spray Place 2 sprays into both nostrils daily. 16 g 5  . glucose blood (ONE TOUCH ULTRA TEST) test strip Use as instructed 3 times daily before meals 100 each 12  . glucose blood (TRUE METRIX BLOOD GLUCOSE TEST) test strip Use as instructed TID 100 each 12  . Lancets (ONETOUCH ULTRASOFT) lancets Use as instructed 3 times daily before meals 100 each 12  . lidocaine (LIDODERM) 5 % Place 1 patch onto the skin daily. Remove & Discard patch within 12 hours or as directed by MD 30 patch 1  . Polyethyl Glycol-Propyl Glycol (SYSTANE OP) Place 1 drop into both eyes daily as needed (dry eyes).    . carvedilol (COREG) 3.125 MG tablet TAKE 1 TABLET BY MOUTH TWICE DAILY WITH A MEAL 180 tablet 1  . cetirizine (ZYRTEC) 10 MG tablet Take 1 tablet (10 mg total) by mouth daily. 30 tablet 1  . glipiZIDE (GLUCOTROL) 10 MG tablet Take 1 tablet (10 mg total) by mouth 2 (two) times daily before a meal. 180 tablet 1  . ipratropium (ATROVENT) 0.06 % nasal spray PLACE 2 SPRAY(S) IN EACH NOSTRIL TWICE DAILY AS NEEDED FOR DRAINAGE 15 mL 2  . isosorbide mononitrate (IMDUR) 60 MG 24 hr tablet Take 1 tablet (60 mg total) by mouth daily. 90 tablet 1  . montelukast (SINGULAIR) 10 MG tablet Take 1 tablet (10 mg total) by mouth at bedtime. 30 tablet 5  . pantoprazole (PROTONIX) 40 MG tablet Take 1 tablet (40 mg total) by mouth daily. 30 tablet 5  . rosuvastatin (CRESTOR) 5 MG tablet Take 1 tablet (5 mg total) by mouth daily. 90 tablet 1  . verapamil (CALAN) 80 MG tablet Take 1 tablet (80 mg total) by mouth 2 (two) times daily. 180 tablet 1   No  facility-administered medications prior to visit.     ROS Review of Systems  Constitutional: Negative for activity change, appetite change and fatigue.  HENT: Positive for postnasal drip. Negative for congestion, sinus pressure and sore throat.   Eyes: Negative for visual disturbance.  Respiratory: Positive for cough and shortness of breath. Negative for chest tightness and wheezing.   Cardiovascular: Negative for chest pain and palpitations.  Gastrointestinal: Negative for abdominal distention, abdominal pain and constipation.  Endocrine: Negative for polydipsia.  Genitourinary: Negative for dysuria and frequency.  Musculoskeletal: Negative for arthralgias and back pain.  Skin: Negative for rash.  Neurological: Positive for headaches. Negative for tremors, light-headedness and numbness.  Hematological: Does not bruise/bleed  easily.  Psychiatric/Behavioral: Negative for agitation and behavioral problems.    Objective:  BP 130/80   Pulse 66   Temp 98.3 F (36.8 C) (Oral)   Ht 5' 2"  (1.575 m)   LMP  (LMP Unknown)   SpO2 98%   BMI 27.98 kg/m   BP/Weight 07/13/2018 06/23/2018 29/51/8841  Systolic BP 660 630 160  Diastolic BP 80 80 79  Wt. (Lbs) - 153 140.6  BMI 27.98 27.98 25.72      Physical Exam  Constitutional: She is oriented to person, place, and time. She appears well-developed and well-nourished.  HENT:  Oropharyngeal erythema  Cardiovascular: Normal rate, normal heart sounds and intact distal pulses.  No murmur heard. Pulmonary/Chest: Effort normal and breath sounds normal. She has no wheezes. She has no rales. She exhibits no tenderness.  Abdominal: Soft. Bowel sounds are normal. She exhibits no distension and no mass. There is no tenderness.  Musculoskeletal: Normal range of motion.  Neurological: She is alert and oriented to person, place, and time.  Psychiatric: She has a normal mood and affect.    Lab Results  Component Value Date   HGBA1C 6.3 07/13/2018      Assessment & Plan:   1. Type 2 diabetes mellitus without complication, without long-term current use of insulin (HCC) Controlled with A1c of 6.3 Continue current regimen - POCT glucose (manual entry) - POCT glycosylated hemoglobin (Hb A1C)  2. Essential hypertension Controlled Low-sodium diet - verapamil (CALAN) 80 MG tablet; Take 1 tablet (80 mg total) by mouth 2 (two) times daily.  Dispense: 180 tablet; Refill: 1 - isosorbide mononitrate (IMDUR) 60 MG 24 hr tablet; Take 1 tablet (60 mg total) by mouth daily.  Dispense: 90 tablet; Refill: 1 - carvedilol (COREG) 3.125 MG tablet; TAKE 1 TABLET BY MOUTH TWICE DAILY WITH A MEAL  Dispense: 180 tablet; Refill: 1  3. Pure hypercholesterolemia Controlled Low-cholesterol diet - rosuvastatin (CRESTOR) 5 MG tablet; Take 1 tablet (5 mg total) by mouth daily.  Dispense: 90 tablet; Refill: 1  4. Upper airway cough syndrome Uncontrolled Previously seen by ENT and pulmonary Cough behavioral therapy recommended however she never made It there due to transportation issues - pantoprazole (PROTONIX) 40 MG tablet; Take 1 tablet (40 mg total) by mouth daily.  Dispense: 30 tablet; Refill: 5 - montelukast (SINGULAIR) 10 MG tablet; Take 1 tablet (10 mg total) by mouth at bedtime.  Dispense: 30 tablet; Refill: 5 - ipratropium (ATROVENT) 0.06 % nasal spray; PLACE 2 SPRAY(S) IN EACH NOSTRIL TWICE DAILY AS NEEDED FOR DRAINAGE  Dispense: 15 mL; Refill: 2  5. Type 2 diabetes mellitus with other specified complication, without long-term current use of insulin (HCC) See 1 above - glipiZIDE (GLUCOTROL) 10 MG tablet; Take 1 tablet (10 mg total) by mouth 2 (two) times daily before a meal.  Dispense: 180 tablet; Refill: 1  6. Seasonal and perennial allergic rhinitis Uncontrolled - cetirizine (ZYRTEC) 10 MG tablet; Take 1 tablet (10 mg total) by mouth daily.  Dispense: 30 tablet; Refill: 1  7. Other chronic sinusitis Continue antihistamine Advised to  follow-up with ENT  8. Chronic intractable headache, unspecified headache type - CT MAXILLOFACIAL WO CONTRAST; Future   Meds ordered this encounter  Medications  . verapamil (CALAN) 80 MG tablet    Sig: Take 1 tablet (80 mg total) by mouth 2 (two) times daily.    Dispense:  180 tablet    Refill:  1    Ninety-day supply  . rosuvastatin (CRESTOR) 5 MG  tablet    Sig: Take 1 tablet (5 mg total) by mouth daily.    Dispense:  90 tablet    Refill:  1  . pantoprazole (PROTONIX) 40 MG tablet    Sig: Take 1 tablet (40 mg total) by mouth daily.    Dispense:  30 tablet    Refill:  5  . montelukast (SINGULAIR) 10 MG tablet    Sig: Take 1 tablet (10 mg total) by mouth at bedtime.    Dispense:  30 tablet    Refill:  5  . isosorbide mononitrate (IMDUR) 60 MG 24 hr tablet    Sig: Take 1 tablet (60 mg total) by mouth daily.    Dispense:  90 tablet    Refill:  1  . ipratropium (ATROVENT) 0.06 % nasal spray    Sig: PLACE 2 SPRAY(S) IN EACH NOSTRIL TWICE DAILY AS NEEDED FOR DRAINAGE    Dispense:  15 mL    Refill:  2  . glipiZIDE (GLUCOTROL) 10 MG tablet    Sig: Take 1 tablet (10 mg total) by mouth 2 (two) times daily before a meal.    Dispense:  180 tablet    Refill:  1  . cetirizine (ZYRTEC) 10 MG tablet    Sig: Take 1 tablet (10 mg total) by mouth daily.    Dispense:  30 tablet    Refill:  1  . carvedilol (COREG) 3.125 MG tablet    Sig: TAKE 1 TABLET BY MOUTH TWICE DAILY WITH A MEAL    Dispense:  180 tablet    Refill:  1    Please consider 90 day supplies to promote better adherence    Follow-up: Return in about 3 months (around 10/12/2018) for Follow-up of chronic medical conditions.   Charlott Rakes MD

## 2018-07-16 ENCOUNTER — Emergency Department (HOSPITAL_COMMUNITY)
Admission: EM | Admit: 2018-07-16 | Discharge: 2018-07-16 | Disposition: A | Discharge status: Home / Self Care | Payer: Medicaid Other | Attending: Emergency Medicine | Admitting: Emergency Medicine

## 2018-07-16 ENCOUNTER — Emergency Department (HOSPITAL_COMMUNITY): Payer: Medicaid Other

## 2018-07-16 ENCOUNTER — Encounter (HOSPITAL_COMMUNITY): Payer: Self-pay | Admitting: Emergency Medicine

## 2018-07-16 DIAGNOSIS — I5032 Chronic diastolic (congestive) heart failure: Secondary | ICD-10-CM | POA: Diagnosis not present

## 2018-07-16 DIAGNOSIS — E1122 Type 2 diabetes mellitus with diabetic chronic kidney disease: Secondary | ICD-10-CM | POA: Insufficient documentation

## 2018-07-16 DIAGNOSIS — J45909 Unspecified asthma, uncomplicated: Secondary | ICD-10-CM | POA: Insufficient documentation

## 2018-07-16 DIAGNOSIS — I130 Hypertensive heart and chronic kidney disease with heart failure and stage 1 through stage 4 chronic kidney disease, or unspecified chronic kidney disease: Secondary | ICD-10-CM | POA: Diagnosis not present

## 2018-07-16 DIAGNOSIS — N183 Chronic kidney disease, stage 3 (moderate): Secondary | ICD-10-CM | POA: Insufficient documentation

## 2018-07-16 DIAGNOSIS — R42 Dizziness and giddiness: Secondary | ICD-10-CM | POA: Diagnosis not present

## 2018-07-16 DIAGNOSIS — R51 Headache: Secondary | ICD-10-CM | POA: Diagnosis not present

## 2018-07-16 DIAGNOSIS — Z7982 Long term (current) use of aspirin: Secondary | ICD-10-CM | POA: Diagnosis not present

## 2018-07-16 DIAGNOSIS — Z7984 Long term (current) use of oral hypoglycemic drugs: Secondary | ICD-10-CM | POA: Diagnosis not present

## 2018-07-16 DIAGNOSIS — Z79899 Other long term (current) drug therapy: Secondary | ICD-10-CM | POA: Insufficient documentation

## 2018-07-16 DIAGNOSIS — R519 Headache, unspecified: Secondary | ICD-10-CM

## 2018-07-16 LAB — BASIC METABOLIC PANEL
Anion gap: 8 (ref 5–15)
BUN: 27 mg/dL — ABNORMAL HIGH (ref 8–23)
CO2: 28 mmol/L (ref 22–32)
Calcium: 8.8 mg/dL — ABNORMAL LOW (ref 8.9–10.3)
Chloride: 104 mmol/L (ref 98–111)
Creatinine, Ser: 1.88 mg/dL — ABNORMAL HIGH (ref 0.44–1.00)
GFR calc Af Amer: 31 mL/min — ABNORMAL LOW (ref >60)
GFR calc non Af Amer: 26 mL/min — ABNORMAL LOW (ref >60)
Glucose, Bld: 165 mg/dL — ABNORMAL HIGH (ref 70–99)
Potassium: 3.8 mmol/L (ref 3.5–5.1)
Sodium: 140 mmol/L (ref 135–145)

## 2018-07-16 LAB — CBC WITH DIFFERENTIAL/PLATELET
Abs Immature Granulocytes: 0.02 10*3/uL (ref 0.00–0.07)
Basophils Absolute: 0 10*3/uL (ref 0.0–0.1)
Basophils Relative: 0 %
Eosinophils Absolute: 0.2 10*3/uL (ref 0.0–0.5)
Eosinophils Relative: 4 %
HCT: 35.6 % — ABNORMAL LOW (ref 36.0–46.0)
Hemoglobin: 12 g/dL (ref 12.0–15.0)
Immature Granulocytes: 0 %
Lymphocytes Relative: 27 %
Lymphs Abs: 1.4 10*3/uL (ref 0.7–4.0)
MCH: 29.1 pg (ref 26.0–34.0)
MCHC: 33.7 g/dL (ref 30.0–36.0)
MCV: 86.2 fL (ref 80.0–100.0)
Monocytes Absolute: 0.5 10*3/uL (ref 0.1–1.0)
Monocytes Relative: 9 %
Neutro Abs: 3.2 10*3/uL (ref 1.7–7.7)
Neutrophils Relative %: 60 %
Platelets: 153 10*3/uL (ref 150–400)
RBC: 4.13 MIL/uL (ref 3.87–5.11)
RDW: 12.7 % (ref 11.5–15.5)
WBC: 5.4 10*3/uL (ref 4.0–10.5)
nRBC: 0 % (ref 0.0–0.2)

## 2018-07-16 LAB — CBG MONITORING, ED: Glucose-Capillary: 152 mg/dL — ABNORMAL HIGH (ref 70–99)

## 2018-07-16 MED ORDER — SODIUM CHLORIDE 0.9 % IV BOLUS
1000.0000 mL | Freq: Once | INTRAVENOUS | Status: AC
  Administered 2018-07-16: 1000 mL via INTRAVENOUS

## 2018-07-16 MED ORDER — METOCLOPRAMIDE HCL 5 MG/ML IJ SOLN
10.0000 mg | Freq: Once | INTRAMUSCULAR | Status: AC
  Administered 2018-07-16: 10 mg via INTRAVENOUS
  Filled 2018-07-16: qty 2

## 2018-07-16 MED ORDER — ACETAMINOPHEN 500 MG PO TABS
1000.0000 mg | ORAL_TABLET | Freq: Once | ORAL | Status: AC
  Administered 2018-07-16: 1000 mg via ORAL
  Filled 2018-07-16: qty 2

## 2018-07-16 NOTE — ED Triage Notes (Signed)
Patient here via EMS with complaints of headaches x2 days with nausea. Hx of same. Reports that she has hx of brain aneurysm that is being monitored.

## 2018-07-16 NOTE — ED Provider Notes (Signed)
[facility] DEPT Provider Note   CSN: [id number] Arrival date & time: 07/16/18  1116     History   Chief Complaint Chief Complaint  Patient presents with  . Headache  . Nausea    HPI Paula [name] is a 72 y.o. female.  She is complaining of feeling lightheaded/dizzy and weak along with headache 8 out of 10 with associated nausea since this morning.  She said she has had symptoms like this before.  No trauma no fevers no chills no blurry vision or double vision no numbness or focal weakness.  No chest pain or abdominal pain.  She has a cough which is nonproductive and she says is chronic and they have done every test for it and cannot find a reason for it.  The history is provided by the patient.  Headache   This is a recurrent problem. The current episode started 1 to 2 hours ago. The problem occurs constantly. The problem has not changed since onset.The headache is associated with nothing. The pain is located in the occipital region. The pain is at a severity of 8/10. The pain does not radiate. Associated symptoms include malaise/fatigue, near-syncope and nausea. Pertinent negatives include no fever, no chest pressure, no syncope, no shortness of breath and no vomiting. She has tried nothing for the symptoms. The treatment provided no relief.    Past Medical History:  Diagnosis Date  . Allergic rhinitis   . Arthritis   . Asthma   . Brain aneurysm   . Chronic kidney disease    Stage 3  . Cough   . Diabetes mellitus   . Diastolic CHF, chronic ([place]) 10/11/2011  . GERD (gastroesophageal reflux disease)   . History of colon polyps 2012   tubular adenoma   . Hyperlipidemia   . Hypertension   . Neuropathy 10/11/2011  . PONV (postoperative nausea and vomiting)    one time after lymph node surgery    Patient Active Problem List   Diagnosis Date Noted  . Upper airway cough syndrome 10/12/2017  . Chronic rhinitis 10/01/2017  . Seasonal allergic  conjunctivitis 10/01/2017  . Aneurysm ([place]) 09/24/2017  . Acute metabolic encephalopathy 62/83/6629  . URI (upper respiratory infection) 09/02/2017  . Facial weakness 09/02/2017  . Benign essential HTN 09/02/2017  . Allergic rhinitis 08/20/2017  . Chronic pain 05/07/2017  . Palpitations 07/10/2016  . Insomnia 03/25/2016  . Gout 01/23/2016  . History of cataract 02/27/2015  . Seasonal and perennial allergic rhinitis 02/15/2012  . Cough 11/04/2011  . Diabetic neuropathy ([place]) 10/11/2011  . Chronic diastolic congestive heart failure ([place]) 10/11/2011  . CKD (chronic kidney disease) stage 3, GFR 30-59 ml/min (HCC) 10/10/2011  . GERD (gastroesophageal reflux disease)   . Type 2 diabetes mellitus without complication, without long-term current use of insulin ([place]) 07/03/2010  . Hyperlipidemia 07/03/2010  . Essential hypertension 07/03/2010    Past Surgical History:  Procedure Laterality Date  . CATARACT EXTRACTION     right eye  . IR 3D INDEPENDENT WKST  12/14/2017  . IR ANGIO INTRA EXTRACRAN SEL COM CAROTID INNOMINATE BILAT MOD SED  09/15/2017  . IR ANGIO INTRA EXTRACRAN SEL COM CAROTID INNOMINATE UNI L MOD SED  12/14/2017  . IR ANGIO VERTEBRAL SEL VERTEBRAL BILAT MOD SED  09/15/2017  . IR RADIOLOGIST EVAL & MGMT  09/10/2017  . IR RADIOLOGIST EVAL & MGMT  10/19/2017  . lymphatic mass surgery    . lymphoma mass surgery Bilateral  non cancerous  . NASAL TURBINATE REDUCTION    . RADIOLOGY WITH ANESTHESIA N/A 12/14/2017   Procedure: RADIOLOGY WITH ANESTHESIA EMBOLIZATION;  Surgeon: Deveshwar, Sanjeev, MD;  Location: [facility];  Service: Radiology;  Laterality: N/A;  . VIDEO BRONCHOSCOPY Bilateral 11/18/2017   Procedure: VIDEO BRONCHOSCOPY WITHOUT FLUORO;  Surgeon: Byrum, Robert S, MD;  Location: [facility];  Service: Cardiopulmonary;  Laterality: Bilateral;     OB History    Gravida  5   Para  4   Term  4   Preterm      AB  1   Living  4     SAB      TAB  1   Ectopic       Multiple      Live Births               Home Medications    Prior to Admission medications   Medication Sig Start Date End Date Taking? Authorizing Provider  aspirin 81 MG tablet Take 81 mg by mouth daily.      [provider]  Blood Glucose Monitoring Suppl (TRUE METRIX METER) w/Device KIT Use as directed once daily 05/28/18   Newlin, Enobong, MD  calcium carbonate (OSCAL) 1500 (600 Ca) MG TABS tablet Take 1,500 mg by mouth daily with breakfast.    [provider]  carvedilol (COREG) 3.125 MG tablet TAKE 1 TABLET BY MOUTH TWICE DAILY WITH A MEAL 07/13/18   Newlin, Enobong, MD  cetirizine (ZYRTEC) 10 MG tablet Take 1 tablet (10 mg total) by mouth daily. 07/13/18   Newlin, Enobong, MD  Cholecalciferol (VITAMIN D PO) Take 1 tablet by mouth daily.    [provider]  diclofenac sodium (VOLTAREN) 1 % GEL Apply 4 g topically 4 (four) times daily. 06/02/18   Newlin, Enobong, MD  famotidine (PEPCID) 20 MG tablet Take 1 tablet (20 mg total) by mouth 2 (two) times daily. 01/29/18   Byrum, Robert S, MD  fluticasone (FLONASE) 50 MCG/ACT nasal spray Place 2 sprays into both nostrils daily. 12/22/17   Byrum, Robert S, MD  glipiZIDE (GLUCOTROL) 10 MG tablet Take 1 tablet (10 mg total) by mouth 2 (two) times daily before a meal. 07/13/18   Newlin, Enobong, MD  glucose blood (ONE TOUCH ULTRA TEST) test strip Use as instructed 3 times daily before meals 01/11/18   Newlin, Enobong, MD  glucose blood (TRUE METRIX BLOOD GLUCOSE TEST) test strip Use as instructed TID 10/09/17   Newlin, Enobong, MD  ipratropium (ATROVENT) 0.06 % nasal spray PLACE 2 SPRAY(S) IN EACH NOSTRIL TWICE DAILY AS NEEDED FOR DRAINAGE 07/13/18   Newlin, Enobong, MD  isosorbide mononitrate (IMDUR) 60 MG 24 hr tablet Take 1 tablet (60 mg total) by mouth daily. 07/13/18   Newlin, Enobong, MD  Lancets (ONETOUCH ULTRASOFT) lancets Use as instructed 3 times daily before meals 01/11/18   Newlin, Enobong, MD  lidocaine (LIDODERM)  5 % Place 1 patch onto the skin daily. Remove & Discard patch within 12 hours or as directed by MD 06/02/18   Newlin, Enobong, MD  montelukast (SINGULAIR) 10 MG tablet Take 1 tablet (10 mg total) by mouth at bedtime. 07/13/18   Newlin, Enobong, MD  pantoprazole (PROTONIX) 40 MG tablet Take 1 tablet (40 mg total) by mouth daily. 07/13/18   Newlin, Enobong, MD  Polyethyl Glycol-Propyl Glycol (SYSTANE OP) Place 1 drop into both eyes daily as needed (dry eyes).    [provider]  rosuvastatin (CRESTOR) 5 MG   tablet Take 1 tablet (5 mg total) by mouth daily. 07/13/18   Newlin, Enobong, MD  verapamil (CALAN) 80 MG tablet Take 1 tablet (80 mg total) by mouth 2 (two) times daily. 07/13/18   Newlin, Enobong, MD    Family History Family History  Problem Relation Age of Onset  . Hypertension Mother   . Asthma Sister   . Colon cancer Neg Hx   . Allergic rhinitis Neg Hx   . Eczema Neg Hx     Social History Social History   Tobacco Use  . Smoking status: Never Smoker  . Smokeless tobacco: Never Used  Substance Use Topics  . Alcohol use: No  . Drug use: No     Allergies   Dust mite extract; Ace inhibitors; and Lovastatin   Review of Systems Review of Systems  Constitutional: Positive for malaise/fatigue. Negative for fever.  HENT: Negative for sore throat.   Eyes: Negative for visual disturbance.  Respiratory: Negative for shortness of breath.   Cardiovascular: Positive for near-syncope. Negative for chest pain and syncope.  Gastrointestinal: Positive for nausea. Negative for abdominal pain and vomiting.  Genitourinary: Negative for dysuria.  Musculoskeletal: Negative for neck pain.  Skin: Negative for rash.  Neurological: Positive for dizziness, light-headedness and headaches. Negative for seizures, syncope, facial asymmetry, speech difficulty, weakness and numbness.     Physical Exam Updated Vital Signs BP (!) 131/117   Pulse 67   Temp 98.1 F (36.7 C) (Oral)   Resp (!)  24   LMP  (LMP Unknown)   SpO2 100%   Physical Exam  Constitutional: She is oriented to person, place, and time. She appears well-developed and well-nourished. No distress.  HENT:  Head: Normocephalic and atraumatic.  Mouth/Throat: Oropharynx is clear and moist.  Eyes: Conjunctivae are normal.  Neck: Neck supple. No neck rigidity. No Brudzinski's sign and no Kernig's sign noted.  Cardiovascular: Normal rate, regular rhythm and normal heart sounds.  No murmur heard. Pulmonary/Chest: Effort normal and breath sounds normal. No respiratory distress. She has no wheezes.  Abdominal: Soft. She exhibits no distension and no mass. There is no tenderness. There is no guarding.  Musculoskeletal: Normal range of motion. She exhibits no edema or tenderness.  Neurological: She is alert and oriented to person, place, and time. She has normal strength. No cranial nerve deficit or sensory deficit. She displays a negative Romberg sign. GCS eye subscore is 4. GCS verbal subscore is 5. GCS motor subscore is 6.  Skin: Skin is warm and dry.  Psychiatric: She has a normal mood and affect.  Nursing note and vitals reviewed.    ED Treatments / Results  Labs (all labs ordered are listed, but only abnormal results are displayed) Labs Reviewed  BASIC METABOLIC PANEL - Abnormal; Notable for the following components:      Result Value   Glucose, Bld 165 (*)    BUN 27 (*)    Creatinine, Ser 1.88 (*)    Calcium 8.8 (*)    GFR calc non Af Amer 26 (*)    GFR calc Af Amer 31 (*)    All other components within normal limits  CBC WITH DIFFERENTIAL/PLATELET - Abnormal; Notable for the following components:   HCT 35.6 (*)    All other components within normal limits  CBG MONITORING, ED - Abnormal; Notable for the following components:   Glucose-Capillary 152 (*)    All other components within normal limits    EKG None  Radiology Ct Head Wo   Contrast  Result Date: 07/16/2018 CLINICAL DATA:  72 year old  female with acute headache for 2 days. EXAM: CT HEAD WITHOUT CONTRAST TECHNIQUE: Contiguous axial images were obtained from the base of the skull through the vertex without intravenous contrast. COMPARISON:  05/12/2018 and prior CTs FINDINGS: Brain: No evidence of acute infarction, hemorrhage, hydrocephalus, extra-axial collection or mass lesion/mass effect. Mild chronic small-vessel white matter ischemic changes again noted. Vascular: Carotid atherosclerotic calcifications noted. Skull: Normal. Negative for fracture or focal lesion. Sinuses/Orbits: No acute finding. Other: None. IMPRESSION: 1. No evidence of acute intracranial abnormality. 2. Mild chronic small-vessel white matter ischemic changes. Electronically Signed   By: Margarette Canada M.D.   On: 07/16/2018 13:15    Procedures Procedures (including critical care time)  Medications Ordered in ED Medications  sodium chloride 0.9 % bolus 1,000 mL (has no administration in time range)  metoCLOPramide (REGLAN) injection 10 mg (has no administration in time range)     Initial Impression / Assessment and Plan / ED Course  I have reviewed the triage vital signs and the nursing notes.  Pertinent labs & imaging results that were available during my care of the patient were reviewed by me and considered in my medical decision making (see chart for details).  Clinical Course as of Jul 16 1532  Fri Jul 16, 2018  1323 Patient's nausea better but headache still remains.  I asked her what she normally takes and she says Tylenol.  I have ordered some for her.   [MB]  1402 Reevaluated patient.  She is says her headache and dizziness is improved and she is sitting up in bed eating some soup that her family members are brought in.  She is comfortable with discharge and I recommended that she follow-up with her primary care doctor and her neuro interventional radiologist Dr. Estanislado Pandy   [MB]    Clinical Course User Index [MB] Hayden Rasmussen, MD      Final Clinical Impressions(s) / ED Diagnoses   Final diagnoses:  Acute nonintractable headache, unspecified headache type  Lightheadedness    ED Discharge Orders    None       Hayden Rasmussen, MD 07/16/18 [phone]

## 2018-07-16 NOTE — ED Notes (Signed)
Bed: [facility] Expected date:  Expected time:  Means of arrival:  Comments: EMSheadache

## 2018-07-16 NOTE — Discharge Instructions (Addendum)
You were evaluated in the emergency department for headache lightheadedness and nausea.  You had blood work and a CAT scan that did not show an obvious cause of your symptoms.  You improved with some nausea medication and some Tylenol.  Please contact Dr. Estanislado Pandy and your primary care doctor regarding your symptoms.  Return if any concerns.

## 2018-07-16 NOTE — ED Notes (Signed)
Patient transported to CT 

## 2018-07-21 ENCOUNTER — Encounter (HOSPITAL_COMMUNITY): Payer: Self-pay

## 2018-07-21 ENCOUNTER — Ambulatory Visit (HOSPITAL_COMMUNITY)
Admission: RE | Admit: 2018-07-21 | Discharge: 2018-07-21 | Disposition: A | Discharge status: Home / Self Care | Payer: Medicaid Other | Source: Ambulatory Visit | Attending: Family Medicine | Admitting: Family Medicine

## 2018-07-21 DIAGNOSIS — R51 Headache: Secondary | ICD-10-CM | POA: Insufficient documentation

## 2018-07-21 DIAGNOSIS — G8929 Other chronic pain: Secondary | ICD-10-CM

## 2018-07-22 ENCOUNTER — Other Ambulatory Visit (HOSPITAL_COMMUNITY): Payer: Self-pay | Admitting: Interventional Radiology

## 2018-07-22 DIAGNOSIS — I729 Aneurysm of unspecified site: Secondary | ICD-10-CM

## 2018-07-23 ENCOUNTER — Telehealth: Payer: Self-pay

## 2018-07-23 NOTE — Telephone Encounter (Signed)
Patient was called and informed of lab results. 

## 2018-07-23 NOTE — Telephone Encounter (Signed)
-----   Message from Charlott Rakes, MD sent at 07/22/2018  4:34 PM EST ----- CT reveals no fracture or dislocation and sinuses are clear.

## 2018-07-26 MED FILL — ROSUVASTATIN CALCIUM 5 MG T: 5 | 90 days supply | Qty: 90 | Fill #0

## 2018-07-26 MED FILL — MONTELUKAST SOD 10 MG TAB: 10 | 90 days supply | Qty: 90 | Fill #0

## 2018-07-26 MED FILL — PANTOPRAZOLE SOD DR 40 MG T: 40 | 90 days supply | Qty: 180 | Fill #0

## 2018-07-26 MED FILL — IPRATROPIUM 0.06% SPRAY: 0.06 | 30 days supply | Qty: 15 | Fill #0

## 2018-07-30 MED FILL — ROSUVASTATIN CALCIUM 5 MG T: 5 | 30 days supply | Qty: 30 | Fill #2

## 2018-08-09 ENCOUNTER — Ambulatory Visit (HOSPITAL_COMMUNITY): Payer: Medicaid Other

## 2018-08-09 ENCOUNTER — Ambulatory Visit (HOSPITAL_COMMUNITY)
Admission: RE | Admit: 2018-08-09 | Discharge: 2018-08-09 | Disposition: A | Discharge status: Home / Self Care | Payer: Medicaid Other | Source: Ambulatory Visit | Attending: Interventional Radiology | Admitting: Interventional Radiology

## 2018-08-09 DIAGNOSIS — I729 Aneurysm of unspecified site: Secondary | ICD-10-CM | POA: Insufficient documentation

## 2018-08-09 MED FILL — VERAPAMIL 80 MG TABLET: 80 | 30 days supply | Qty: 60 | Fill #0

## 2018-08-09 MED FILL — ISOSORBIDE MN ER 60 MG TAB: 60 | 90 days supply | Qty: 90 | Fill #0

## 2018-08-16 ENCOUNTER — Telehealth (HOSPITAL_COMMUNITY): Payer: Self-pay

## 2018-08-16 NOTE — Telephone Encounter (Signed)
Pt agreed to f/u in 6 months with [facility] head wo. AW  

## 2018-08-27 MED FILL — TRUE METRIX TEST STRIP: 30 days supply | Qty: 100 | Fill #7

## 2018-09-02 ENCOUNTER — Ambulatory Visit: Payer: BLUE CROSS/BLUE SHIELD | Attending: Family Medicine | Admitting: Physician Assistant

## 2018-09-02 VITALS — BP 182/91 | HR 70 | Temp 98.3°F | Ht 62.0 in | Wt 155.0 lb

## 2018-09-02 DIAGNOSIS — R8281 Pyuria: Secondary | ICD-10-CM | POA: Diagnosis not present

## 2018-09-02 DIAGNOSIS — R197 Diarrhea, unspecified: Secondary | ICD-10-CM

## 2018-09-02 DIAGNOSIS — E119 Type 2 diabetes mellitus without complications: Secondary | ICD-10-CM

## 2018-09-02 LAB — POCT URINALYSIS DIP (CLINITEK)
Bilirubin, UA: NEGATIVE
Blood, UA: NEGATIVE
Glucose, UA: NEGATIVE mg/dL
Ketones, POC UA: NEGATIVE mg/dL
Nitrite, UA: NEGATIVE
POC PROTEIN,UA: >=300 — AB
Spec Grav, UA: 1.015 (ref 1.010–1.025)
Urobilinogen, UA: 0.2 E.U./dL
pH, UA: 5 (ref 5.0–8.0)

## 2018-09-02 LAB — GLUCOSE, POCT (MANUAL RESULT ENTRY): POC Glucose: 322 mg/dl — AB (ref 70–99)

## 2018-09-02 MED ORDER — INSULIN ASPART 100 UNIT/ML ~~LOC~~ SOLN: 15.0000 [IU] | Freq: Once | SUBCUTANEOUS | Status: DC

## 2018-09-02 MED FILL — IPRATROPIUM 0.06% SPRAY: 0.06 | 30 days supply | Qty: 15 | Fill #1

## 2018-09-02 NOTE — Patient Instructions (Addendum)
Diarrhea, Adult Diarrhea is when you pass loose and watery poop (stool) often. Diarrhea can make you feel weak and cause you to lose water in your body (get dehydrated). Losing water in your body can cause you to:  Feel tired and thirsty.  Have a dry mouth.  Go pee (urinate) less often. Diarrhea often lasts 2-3 days. However, it can last longer if it is a sign of something more serious. It is important to treat your diarrhea as told by your doctor. Follow these instructions at home: Eating and drinking     Follow these instructions as told by your doctor:  Take an ORS (oral rehydration solution). This is a drink that helps you replace fluids and minerals your body lost. It is sold at pharmacies and stores.  Drink plenty of fluids, such as: ? Water. ? Ice chips.   Avoid drinking fluids that have a lot of sugar or caffeine in them.  Eat bland, easy-to-digest foods in small amounts as you are able. These foods including: ? Low-fat (lean) meats. ? Toast. ? Crackers.  Avoid alcohol.  Avoid spicy or fatty foods.  Medicines  Take over-the-counter and prescription medicines only as told by your doctor.  If you were prescribed an antibiotic medicine, take it as told by your doctor. Do not stop using the antibiotic even if you start to feel better. General instructions   Wash your hands often using soap and water. If soap and water are not available, use a hand sanitizer. Others in your home should wash their hands as well. Hands should be washed: ? After using the toilet or changing a diaper. ? Before preparing, cooking, or serving food. ? While caring for a sick person. ? While visiting someone in a hospital.  Drink enough fluid to keep your pee (urine) pale yellow.  Rest at home while you get better.  Watch your condition for any changes.  Take a warm bath to help with any burning or pain from having diarrhea.  Keep all follow-up visits as told by your doctor. This is  important. Contact a doctor if:  You have a fever.  Your diarrhea gets worse.  You have new symptoms.  You cannot keep fluids down.  You feel light-headed or dizzy.  You have a headache.  You have muscle cramps. Get help right away if:  You have chest pain.  You feel very weak or you pass out (faint).  You have bloody or black poop or poop that looks like tar.  You have very bad pain, cramping, or bloating in your belly (abdomen).  You have trouble breathing or you are breathing very quickly.  Your heart is beating very quickly.  Your skin feels cold and clammy.  You feel confused.  You have signs of losing too much water in your body, such as: ? Dark pee, very little pee, or no pee. ? Cracked lips. ? Dry mouth. ? Sunken eyes. ? Sleepiness. ? Weakness. Summary  Diarrhea is when you pass loose and watery poop (stool) often.  Diarrhea can make you feel weak and cause you to lose water in your body (get dehydrated).  Take an ORS (oral rehydration solution). This is a drink that is sold at pharmacies and stores.  Eat bland, easy-to-digest foods in small amounts as you are able.  Contact a doctor if your condition gets worse. Get help right away if you have signs that you have lost too much water in your body. This information is  not intended to replace advice given to you by your health care provider. Make sure you discuss any questions you have with your health care provider. Document Released: 01/14/2008 Document Revised: 01/01/2018 Document Reviewed: 01/01/2018 Elsevier Interactive Patient Education  2019 [employer].

## 2018-09-02 NOTE — Progress Notes (Signed)
Patient ID: Paula [name], female   DOB: 1945-11-23, 73 y.o.   MRN: [id number]   Paula [name], is a 73 y.o. female  OFB:[id number]  POE:[id number]  DOB - [birth year]  Subjective:  Chief Complaint and HPI: Paula [name] is a 73 y.o. female here today due to 2 large loose BMs yesterday.  This morning's BM was more firm.  She felt a little under the weather yesterday, but denies abdominal pain.  No nausea.  Sometimes she coughs til she vomits  ROS:   Constitutional:  No f/c, No night sweats, No unexplained weight loss. EENT:  No vision changes, No blurry vision, No hearing changes. No mouth, throat, or ear problems.  Respiratory: + cough X 24 years, No SOB Cardiac: No CP, no palpitations GI:  No abd pain, No N/V/D. GU: No Urinary s/sx Musculoskeletal: No joint pain Neuro: No headache, no dizziness, no motor weakness.  Skin: No rash Endocrine:  No polydipsia. No polyuria.  Psych: Denies SI/HI  No problems updated.  ALLERGIES: Allergies  Allergen Reactions  . Dust Mite Extract Cough  . Ace Inhibitors Cough  . Lovastatin Other (See Comments)    Generalized body pain    PAST MEDICAL HISTORY: Past Medical History:  Diagnosis Date  . Allergic rhinitis   . Arthritis   . Asthma   . Brain aneurysm   . Chronic kidney disease    Stage 3  . Cough   . Diabetes mellitus   . Diastolic CHF, chronic ([place]) 10/11/2011  . GERD (gastroesophageal reflux disease)   . History of colon polyps 2012   tubular adenoma   . Hyperlipidemia   . Hypertension   . Neuropathy 10/11/2011  . PONV (postoperative nausea and vomiting)    one time after lymph node surgery    MEDICATIONS AT HOME: Prior to Admission medications   Medication Sig Start Date End Date Taking? Authorizing Provider  acetaminophen (TYLENOL) 500 MG tablet Take 500 mg by mouth every 6 (six) hours as needed for mild pain.   Yes [provider]  aspirin 81 MG tablet Take 81 mg by mouth every evening.    Yes  [provider]  Bitter Melon Extract 10 % POWD Take 1 Scoop by mouth daily as needed (high blood sugar). Mix with water   Yes [provider]  Blood Glucose Monitoring Suppl (TRUE METRIX METER) w/Device KIT Use as directed once daily 05/28/18  Yes Newlin, Enobong, MD  calcium-vitamin D (OSCAL WITH D) 250-125 MG-UNIT tablet Take 1 tablet by mouth daily.   Yes [provider]  carvedilol (COREG) 3.125 MG tablet TAKE 1 TABLET BY MOUTH TWICE DAILY WITH A MEAL Patient taking differently: Take 3.125 mg by mouth 2 (two) times daily with a meal.  07/13/18  Yes Newlin, Enobong, MD  cetirizine (ZYRTEC) 10 MG tablet Take 1 tablet (10 mg total) by mouth daily. 07/13/18  Yes Charlott Rakes, MD  diclofenac sodium (VOLTAREN) 1 % GEL Apply 4 g topically 4 (four) times daily. Patient taking differently: Apply 4 g topically 4 (four) times daily as needed (pain).  06/02/18  Yes Charlott Rakes, MD  glipiZIDE (GLUCOTROL) 10 MG tablet Take 1 tablet (10 mg total) by mouth 2 (two) times daily before a meal. 07/13/18  Yes Newlin, Enobong, MD  glucose blood (ONE TOUCH ULTRA TEST) test strip Use as instructed 3 times daily before meals 01/11/18  Yes Newlin, Enobong, MD  glucose blood (TRUE METRIX BLOOD GLUCOSE TEST) test strip Use as instructed TID  10/09/17  Yes Charlott Rakes, MD  Homeopathic Products (EARACHE DROPS) SOLN Place 1 drop in ear(s) daily.   Yes [provider]  ipratropium (ATROVENT) 0.06 % nasal spray PLACE 2 SPRAY(S) IN EACH NOSTRIL TWICE DAILY AS NEEDED FOR DRAINAGE Patient taking differently: Place 2 sprays into both nostrils 2 (two) times daily as needed for rhinitis. PLACE 2 SPRAY(S) IN EACH NOSTRIL TWICE DAILY AS NEEDED FOR DRAINAG 07/13/18  Yes Charlott Rakes, MD  isosorbide mononitrate (IMDUR) 60 MG 24 hr tablet Take 1 tablet (60 mg total) by mouth daily. 07/13/18  Yes Charlott Rakes, MD  Lancets (ONETOUCH ULTRASOFT) lancets Use as instructed 3 times daily before meals  01/11/18  Yes Newlin, Enobong, MD  lidocaine (LIDODERM) 5 % Place 1 patch onto the skin daily. Remove & Discard patch within 12 hours or as directed by MD Patient taking differently: Place 1 patch onto the skin daily as needed (pain). Remove & Discard patch within 12 hours or as directed by MD 06/02/18  Yes Charlott Rakes, MD  montelukast (SINGULAIR) 10 MG tablet Take 1 tablet (10 mg total) by mouth at bedtime. 07/13/18  Yes Charlott Rakes, MD  pantoprazole (PROTONIX) 40 MG tablet Take 1 tablet (40 mg total) by mouth daily. 07/13/18  Yes Charlott Rakes, MD  Polyethyl Glycol-Propyl Glycol (SYSTANE OP) Place 1 drop into both eyes daily as needed (dry eyes).   Yes [provider]  rosuvastatin (CRESTOR) 5 MG tablet Take 1 tablet (5 mg total) by mouth daily. Patient taking differently: Take 5 mg by mouth every evening.  07/13/18  Yes Charlott Rakes, MD  verapamil (CALAN) 80 MG tablet Take 1 tablet (80 mg total) by mouth 2 (two) times daily. 07/13/18  Yes Charlott Rakes, MD  famotidine (PEPCID) 20 MG tablet Take 1 tablet (20 mg total) by mouth 2 (two) times daily. Patient not taking: Reported on 07/16/2018 01/29/18   Collene Gobble, MD  fluticasone [facility]) 50 MCG/ACT nasal spray Place 2 sprays into both nostrils daily. Patient not taking: Reported on 07/16/2018 12/22/17   Collene Gobble, MD     Objective:  EXAM:   Vitals:   09/02/18 1036  BP: (!) 182/91  Pulse: 70  Temp: 98.3 F (36.8 C)  TempSrc: Oral  SpO2: 97%  Weight: 155 lb (70.3 kg)  Height: 5' 2"  (1.575 m)    General appearance : A&OX3. NAD. Non-toxic-appearing HEENT: Atraumatic and Normocephalic.  PERRLA. EOM intact.  TM clear B. Mouth-MMM, post pharynx WNL w/o erythema, No PND. Neck: supple, no JVD. No cervical lymphadenopathy. No thyromegaly Chest/Lungs:  Breathing-non-labored, Good air entry bilaterally, breath sounds normal without rales, rhonchi, or wheezing  CVS: S1 S2 regular, no murmurs, gallops, rubs  Abdomen:  Bowel sounds present, Non tender and not distended with no gaurding, rigidity or rebound. Extremities: Bilateral Lower Ext shows no edema, both legs are warm to touch with = pulse throughout Neurology:  CN II-XII grossly intact, Non focal.   Psych:  TP scattered. J/I WNL. Normal speech. Appropriate eye contact and affect.  Skin:  No Rash  Data Review Lab Results  Component Value Date   HGBA1C 6.3 07/13/2018   HGBA1C 6.7 04/15/2018   HGBA1C 6.6 (H) 12/09/2017     Assessment & Plan   1. Diarrhea, unspecified type Likely viral syndrome-seems to have resolved at this point.  She is not orthostatic.  To ED or UC if worsens.   - Comprehensive metabolic panel - CBC with Differential/Platelet  2. Type 2 diabetes  mellitus without complication, without long-term current use of insulin ([place]) Uncontrolled-she has refused insulin today.  She didn't take her meds the last 2 days.  Resume meds.  Drink adequate water - Glucose (CBG) - POCT URINALYSIS DIP (CLINITEK)  Patient have been counseled extensively about nutrition and exercise  Return for for 10/19/2018 appt with Dr Margarita Rana.  The patient was given clear instructions to go to ER or return to medical center if symptoms don't improve, worsen or new problems develop. The patient verbalized understanding. The patient was told to call to get lab results if they haven't heard anything in the next week.   Freeman Caldron, PA-C [facility] [place], [place]   09/02/2018, 11:02 AM

## 2018-09-03 LAB — COMPREHENSIVE METABOLIC PANEL
ALT: 17 IU/L (ref 0–32)
AST: 19 IU/L (ref 0–40)
Albumin/Globulin Ratio: 1.4 (ref 1.2–2.2)
Albumin: 3.8 g/dL (ref 3.7–4.7)
Alkaline Phosphatase: 76 IU/L (ref 39–117)
BUN/Creatinine Ratio: 14 (ref 12–28)
BUN: 30 mg/dL — ABNORMAL HIGH (ref 8–27)
Bilirubin Total: 0.3 mg/dL (ref 0.0–1.2)
CO2: 22 mmol/L (ref 20–29)
Calcium: 9 mg/dL (ref 8.7–10.3)
Chloride: 95 mmol/L — ABNORMAL LOW (ref 96–106)
Creatinine, Ser: 2.08 mg/dL — ABNORMAL HIGH (ref 0.57–1.00)
GFR calc Af Amer: 27 mL/min/{1.73_m2} — ABNORMAL LOW (ref >59)
GFR calc non Af Amer: 23 mL/min/{1.73_m2} — ABNORMAL LOW (ref >59)
Globulin, Total: 2.8 g/dL (ref 1.5–4.5)
Glucose: 319 mg/dL — ABNORMAL HIGH (ref 65–99)
Potassium: 4 mmol/L (ref 3.5–5.2)
Sodium: 136 mmol/L (ref 134–144)
Total Protein: 6.6 g/dL (ref 6.0–8.5)

## 2018-09-03 LAB — CBC WITH DIFFERENTIAL/PLATELET
Basophils Absolute: 0 10*3/uL (ref 0.0–0.2)
Basos: 1 %
EOS (ABSOLUTE): 0.1 10*3/uL (ref 0.0–0.4)
Eos: 3 %
Hematocrit: 37.3 % (ref 34.0–46.6)
Hemoglobin: 12.4 g/dL (ref 11.1–15.9)
Immature Grans (Abs): 0 10*3/uL (ref 0.0–0.1)
Immature Granulocytes: 1 %
Lymphocytes Absolute: 1.2 10*3/uL (ref 0.7–3.1)
Lymphs: 24 %
MCH: 29 pg (ref 26.6–33.0)
MCHC: 33.2 g/dL (ref 31.5–35.7)
MCV: 87 fL (ref 79–97)
Monocytes Absolute: 0.4 10*3/uL (ref 0.1–0.9)
Monocytes: 7 %
Neutrophils Absolute: 3.3 10*3/uL (ref 1.4–7.0)
Neutrophils: 64 %
Platelets: 203 10*3/uL (ref 150–450)
RBC: 4.28 x10E6/uL (ref 3.77–5.28)
RDW: 13.1 % (ref 11.7–15.4)
WBC: 5.1 10*3/uL (ref 3.4–10.8)

## 2018-09-04 LAB — URINE CULTURE

## 2018-09-06 ENCOUNTER — Telehealth: Payer: Self-pay | Admitting: Family Medicine

## 2018-09-06 MED FILL — VERAPAMIL 80 MG TABLET: 80 | 30 days supply | Qty: 60 | Fill #1

## 2018-09-06 NOTE — Telephone Encounter (Signed)
Patient called stating she is still coughing and that her larynx is still swollen on both sides of her throat. Patient would like advise. Please f/u.

## 2018-09-07 MED ORDER — BENZONATATE 100 MG PO CAPS: 100.0000 mg | ORAL_CAPSULE | Freq: Three times a day (TID) | ORAL | 0 refills | Status: DC | PRN

## 2018-09-07 NOTE — Telephone Encounter (Signed)
Patient was called and informed of medication being sent to pharmacy and to follow up with ENT and pulmonary.

## 2018-09-07 NOTE — Telephone Encounter (Signed)
I have sent prescription for cough medication to her pharmacy.  I would like her to follow-up with her ENT and her pulmonary doctors.

## 2018-09-07 NOTE — Telephone Encounter (Signed)
Will route to PCP for review. 

## 2018-09-08 MED FILL — glipiZIDE 10 MG TABS: 10 | 60 days supply | Qty: 120 | Fill #0

## 2018-09-09 ENCOUNTER — Telehealth: Payer: Self-pay | Admitting: Family Medicine

## 2018-09-09 DIAGNOSIS — J3089 Other allergic rhinitis: Secondary | ICD-10-CM

## 2018-09-09 NOTE — Telephone Encounter (Signed)
Patient need a new Ent Referral  Thank you

## 2018-09-09 NOTE — Telephone Encounter (Signed)
Pt called to request a new referral to ENT, she does not want to assist [facility] any longer, please follow up

## 2018-09-10 ENCOUNTER — Telehealth: Payer: Self-pay

## 2018-09-10 NOTE — Telephone Encounter (Signed)
Referral has been placed. 

## 2018-09-10 NOTE — Telephone Encounter (Signed)
-----   Message from [place] Donovan, [place] sent at 09/03/2018  9:41 AM EST ----- Please call patient.  Her kidney function is impaired-likely due to poor blood sugar control.  Take meds as directed and work on diet with less sugar.  Drink adequate water.  Follow-up as planned.  Thanks, Freeman Caldron, PA-C

## 2018-09-10 NOTE — Telephone Encounter (Signed)
Patient called stating that she was called with her lab results and states that she is doing all the recommendations her nurse told her and she is still not better. Patient states she has been eating well and drinking a lot of water. Patient would like to be referred out to a kidney doctor or for her PCP to change her DM medication. Please f/u

## 2018-09-10 NOTE — Telephone Encounter (Signed)
Patient was called and informed of lab results. 

## 2018-09-10 NOTE — Telephone Encounter (Signed)
Thank you :)

## 2018-09-20 ENCOUNTER — Ambulatory Visit
Admission: RE | Admit: 2018-09-20 | Discharge: 2018-09-20 | Disposition: A | Discharge status: Home / Self Care | Payer: BLUE CROSS/BLUE SHIELD | Source: Ambulatory Visit | Attending: Allergy | Admitting: Allergy

## 2018-09-20 ENCOUNTER — Ambulatory Visit (INDEPENDENT_AMBULATORY_CARE_PROVIDER_SITE_OTHER): Payer: Self-pay | Admitting: Allergy

## 2018-09-20 ENCOUNTER — Encounter: Payer: Self-pay | Admitting: Allergy

## 2018-09-20 VITALS — BP 158/90 | HR 76 | Temp 98.2°F | Resp 16 | Ht 62.0 in | Wt 154.8 lb

## 2018-09-20 DIAGNOSIS — J310 Chronic rhinitis: Secondary | ICD-10-CM

## 2018-09-20 DIAGNOSIS — R059 Cough, unspecified: Secondary | ICD-10-CM

## 2018-09-20 DIAGNOSIS — R05 Cough: Secondary | ICD-10-CM

## 2018-09-20 MED ORDER — FLUTICASONE PROPIONATE 50 MCG/ACT NA SUSP: 2.0000 | Freq: Every day | NASAL | 5 refills | Status: DC

## 2018-09-20 MED ORDER — AZELASTINE HCL 0.1 % NA SOLN: 2.0000 | Freq: Two times a day (BID) | NASAL | 5 refills | Status: DC

## 2018-09-20 NOTE — Progress Notes (Signed)
Follow Up Note  RE: Paula [name] MRN: [id number] DOB: 04-Feb-1946 Date of Office Visit: 09/20/2018  Referring provider: Charlott Rakes, MD Primary care provider: Charlott Rakes, MD  Chief Complaint: Cough and Allergic Rhinitis   History of Present Illness: I had the pleasure of seeing Paula [name] for a follow up visit at the [facility] on 09/22/2018. She is a 73 y.o. female, who is being followed for coughing and allergic rhinitis. Today she is here for new complaint of persistent coughing. Her previous allergy office visit was on 01/07/2018 with Dr. Nelva Bush.   She reports symptoms of coughing with post tussive emesis, chest tightness, shortness of breath, wheezing, nocturnal awakenings for 24 years. Current medications include none. She reports not using aerochamber with inhalers. She tried the following inhalers: ventolin. Main triggers are strong scents, temperature changes.  In the last month, frequency of symptoms: daily. Frequency of nocturnal symptoms: nightly. Frequency of SABA use: 0x/week. In the last 12 months, emergency room visits/urgent care visits/doctor office visits or hospitalizations due to respiratory issues: 3. In the last 12 months, oral steroids courses: not sure. Lifetime history of hospitalization for respiratory issues: yes. History of pneumonia: no. She was evaluated by allergist/pulmonologist in the past. Smoking exposure: no. Up to date with flu vaccine: yes. Up to date with pneumonia vaccine: no.   She reports symptoms of PND, coughing, itchy/watery eyes, itchy nose, rhinorrhea. Symptoms have been going on for 20+ years. The symptoms are present all year around. She has used various allergy pills,  with minimal improvement in symptoms. Sinus infections: no. Previous work up includes: 2019 immunocap was negative. Previous ENT evaluation: yes and recommended cough behavioral therapy. She did attend a session with some benefit.   Patient  is being treated for GERD.  She also had diagnoses of diabetes, hypertension with diastolic dysfunction.  She tried the following remedies: zantac, chlorpheniramine, gabapentin with no significant benefit.  Bronchoscopy on 11/18/2017 which showed "very crowded larynx with some edema" - normal cytology. Full body PFT normal.   Assessment and Plan: Paula [name] is a 73 y.o. female with: Cough Persistent coughing for 20+ years. She had extensive work up with pulmonology - full body PFT and bronchoscopy which were both unremarkable. ENT evaluation was unremarkable and recommended cough behavioral therapy which she just started with some benefit. She has tried PPIs, antihistamines, various nasal sprays, gabapentin with minimal benefit. 2019 immunocap was negative to environmental allergies. Patient wants to get skin prick testing done.   Today's skin prick testing showed: negative to environmental panel and select foods however the positive control was negative as well questioning the validity of the results. Patient denies taking any type of antihistamines for the past 3 days.   Today's spirometry was normal.   No recent CXR and will obtain one.  Discussed with patient that not sure what is triggering her coughing. It is probably multifactorial in nature.   Strongly recommend that she continues with her therapy.   Continue with Protonix 26m daily. Continue reflux diet.  Stop Atrovent.  Start Flonase 2 sprays daily.  Start azelastine 2 sprays twice a day for post nasal drip.   Start Nasal saline spray (i.e., Simply Saline) or nasal saline lavage (i.e., NeilMed) is recommended as needed and prior to medicated nasal sprays.  Chronic rhinitis See assessment and plan as above.   Return in about 4 weeks (around 10/18/2018).  Meds ordered this encounter  Medications  . azelastine (ASTELIN) 0.1 %  nasal spray    Sig: Place 2 sprays into both nostrils 2 (two) times daily.    Dispense:  30 mL     Refill:  5  . fluticasone (FLONASE) 50 MCG/ACT nasal spray    Sig: Place 2 sprays into both nostrils daily.    Dispense:  16 g    Refill:  5   Diagnostics: Spirometry:  Tracings reviewed. Her effort: Good reproducible efforts. FVC: 2.21 L FEV1: 1.74 L, 107 % predicted FEV1/FVC ratio: 79 % Interpretation: No overt abnormalities noted given today's efforts.  Please see scanned spirometry results for details.  Skin Testing: Environmental allergy panel and select foods Negative test to: Environmental allergy panel and select foods including positive control questioning the validity of the results. Results discussed with patient/family. Airborne Adult Perc - 09/20/18 1533    Time Antigen Placed  1534    Allergen Manufacturer  Lavella Hammock    Location  Back    Number of Test  59    Panel 1  Select    1. Control-Buffer 50% Glycerol  Negative    2. Control-Histamine 1 mg/ml  Negative    3. Albumin saline  Negative    4. Squirrel [place]  Negative    5. [place]  Negative    6. [place]  Negative    7. [place] Blue  Negative    8. Meadow Fescue  Negative    9. Perennial Rye  Negative    10. Sweet Vernal  Negative    11. Timothy  Negative    12. Cocklebur  Negative    13. Burweed [place]er  Negative    14. Ragweed, short  Negative    15. Ragweed, Giant  Negative    16. Plantain,  English  Negative    17. Lamb's Quarters  Negative    18. Sheep [place]  Negative    19. Rough Pigweed  Negative    20. Marsh Elder, Rough  Negative    21. Mugwort, Common  Negative    22. Ash mix  Negative    23. Birch mix  Negative    24. Beech American  Negative    25. Box, Elder  Negative    26. Cedar, red  Negative    27. Cottonwood, [place]  Negative    28. Elm mix  Negative    29. Hickory mix  Negative    30. Maple mix  Negative    31. Oak, [place] mix  Negative    32. Pecan Pollen  Negative    33. Pine mix  Negative    34. Sycamore Eastern  Negative    35. [place], Black Pollen  Negative    36.  Alternaria alternata  Negative    37. Cladosporium Herbarum  Negative    38. Aspergillus mix  Negative    39. Penicillium mix  Negative    40. Bipolaris sorokiniana (Helminthosporium)  Negative    41. Drechslera spicifera (Curvularia)  Negative    42. Mucor plumbeus  Negative    43. Fusarium moniliforme  Negative    44. Aureobasidium pullulans (pullulara)  Negative    45. Rhizopus oryzae  Negative    46. Botrytis cinera  Negative    47. Epicoccum nigrum  Negative    48. Phoma betae  Negative    49. Candida Albicans  Negative    50. Trichophyton mentagrophytes  Negative    51. Mite, D Farinae  5,000 AU/ml  Negative    52. Mite, D Pteronyssinus  5,000 AU/ml  Negative    53. Cat Hair 10,000 BAU/ml  Negative    54.  Dog Epithelia  Negative    55. Mixed Feathers  Negative    56. Horse Epithelia  Negative    57. Cockroach, German  Negative    58. Mouse  Negative    59. Tobacco Leaf  Negative     Food Perc - 09/20/18 1534    Time Antigen Placed  1534    Allergen Manufacturer  Lavella Hammock    Location  Back    Number of allergen test  10    Food  Select    1. Peanut  Negative    2. Soybean food  Negative    3. Wheat, whole  Negative    4. Sesame  Negative    5. Milk, cow  Negative    6. Egg White, chicken  Negative    7. Casein  Negative    8. Shellfish mix  Negative    9. Fish mix  Negative    10. Cashew  Negative       Medication List:  Current Outpatient Medications  Medication Sig Dispense Refill  . aspirin 81 MG tablet Take 81 mg by mouth every evening.     . Blood Glucose Monitoring Suppl (TRUE METRIX METER) w/Device KIT Use as directed once daily 1 kit 0  . calcium-vitamin D (OSCAL WITH D) 250-125 MG-UNIT tablet Take 1 tablet by mouth daily.    . carvedilol (COREG) 3.125 MG tablet TAKE 1 TABLET BY MOUTH TWICE DAILY WITH A MEAL (Patient taking differently: Take 3.125 mg by mouth 2 (two) times daily with a meal. ) 180 tablet 1  . cetirizine (ZYRTEC) 10 MG tablet Take 1 tablet  (10 mg total) by mouth daily. 30 tablet 1  . glipiZIDE (GLUCOTROL) 10 MG tablet Take 1 tablet (10 mg total) by mouth 2 (two) times daily before a meal. 180 tablet 1  . glucose blood (ONE TOUCH ULTRA TEST) test strip Use as instructed 3 times daily before meals 100 each 12  . glucose blood (TRUE METRIX BLOOD GLUCOSE TEST) test strip Use as instructed TID 100 each 12  . Homeopathic Products (EARACHE DROPS) SOLN Place 1 drop in ear(s) daily.    . isosorbide mononitrate (IMDUR) 60 MG 24 hr tablet Take 1 tablet (60 mg total) by mouth daily. 90 tablet 1  . Lancets (ONETOUCH ULTRASOFT) lancets Use as instructed 3 times daily before meals 100 each 12  . pantoprazole (PROTONIX) 40 MG tablet Take 1 tablet (40 mg total) by mouth daily. 30 tablet 5  . Polyethyl Glycol-Propyl Glycol (SYSTANE OP) Place 1 drop into both eyes daily as needed (dry eyes).    . rosuvastatin (CRESTOR) 5 MG tablet Take 1 tablet (5 mg total) by mouth daily. (Patient taking differently: Take 5 mg by mouth every evening. ) 90 tablet 1  . acetaminophen (TYLENOL) 500 MG tablet Take 500 mg by mouth every 6 (six) hours as needed for mild pain.    Marland Kitchen azelastine (ASTELIN) 0.1 % nasal spray Place 2 sprays into both nostrils 2 (two) times daily. 30 mL 5  . benzonatate (TESSALON) 100 MG capsule Take 1 capsule (100 mg total) by mouth 3 (three) times daily as needed for cough. (Patient not taking: Reported on 09/20/2018) 30 capsule 0  . Bitter Melon Extract 10 % POWD Take 1 Scoop by mouth daily as needed (high blood sugar). Mix with water    . diclofenac  sodium (VOLTAREN) 1 % GEL Apply 4 g topically 4 (four) times daily. (Patient not taking: Reported on 09/20/2018) 100 g 3  . famotidine (PEPCID) 20 MG tablet Take 1 tablet (20 mg total) by mouth 2 (two) times daily. (Patient not taking: Reported on 07/16/2018) 60 tablet 1  . fluticasone (FLONASE) 50 MCG/ACT nasal spray Place 2 sprays into both nostrils daily. (Patient not taking: Reported on 07/16/2018) 16  g 5  . fluticasone (FLONASE) 50 MCG/ACT nasal spray Place 2 sprays into both nostrils daily. 16 g 5  . lidocaine (LIDODERM) 5 % Place 1 patch onto the skin daily. Remove & Discard patch within 12 hours or as directed by MD (Patient not taking: Reported on 09/20/2018) 30 patch 1  . montelukast (SINGULAIR) 10 MG tablet Take 1 tablet (10 mg total) by mouth at bedtime. (Patient not taking: Reported on 09/20/2018) 30 tablet 5  . verapamil (CALAN) 80 MG tablet Take 1 tablet (80 mg total) by mouth 2 (two) times daily. (Patient not taking: Reported on 09/20/2018) 180 tablet 1   No current facility-administered medications for this visit.    Allergies: Allergies  Allergen Reactions  . Dust Mite Extract Cough  . Ace Inhibitors Cough  . Lovastatin Other (See Comments)    Generalized body pain   I reviewed her past medical history, social history, family history, and environmental history and no significant changes have been reported from previous visit on 01/07/2018.  Review of Systems  Constitutional: Negative for appetite change, chills, fever and unexpected weight change.  HENT: Positive for congestion, postnasal drip and rhinorrhea.   Eyes: Positive for itching.  Respiratory: Positive for cough. Negative for chest tightness, shortness of breath and wheezing.   Gastrointestinal: Negative for abdominal pain.  Skin: Negative for rash.  Allergic/Immunologic: Negative for environmental allergies and food allergies.  Neurological: Negative for headaches.   Objective: BP (!) 158/90 (BP Location: Right Arm, Patient Position: Sitting, Cuff Size: Normal)   Pulse 76   Temp 98.2 F (36.8 C) (Oral)   Resp 16   Ht 5' 2"  (1.575 m)   Wt 154 lb 12.8 oz (70.2 kg)   LMP  (LMP Unknown)   SpO2 96%   BMI 28.31 kg/m  Body mass index is 28.31 kg/m. Physical Exam  Constitutional: She is oriented to person, place, and time. She appears well-developed and well-nourished.  HENT:  Head: Normocephalic and  atraumatic.  Right Ear: External ear normal.  Left Ear: External ear normal.  Nose: Nose normal.  Mouth/Throat: Oropharynx is clear and moist.  Eyes: Conjunctivae and EOM are normal.  Neck: Neck supple.  Cardiovascular: Normal rate, regular rhythm and normal heart sounds. Exam reveals no gallop and no friction rub.  No murmur heard. Coughing during some parts of the exam.   Pulmonary/Chest: Effort normal and breath sounds normal. She has no wheezes. She has no rales.  Abdominal: Soft.  Lymphadenopathy:    She has no cervical adenopathy.  Neurological: She is alert and oriented to person, place, and time.  Skin: Skin is warm. No rash noted.  Psychiatric: She has a normal mood and affect. Her behavior is normal.  Nursing note and vitals reviewed.  Previous notes and tests were reviewed. The plan was reviewed with the patient/family, and all questions/concerned were addressed.  It was my pleasure to see Paula [name] today and participate in her care. Please feel free to contact me with any questions or concerns.  Sincerely,  Rexene Alberts, DO Allergy & Immunology  [name] office: [phone] [place] office: [phone]  40 minutes spent face-to-face with more than 50% of the time spent discussing coughing.

## 2018-09-20 NOTE — Patient Instructions (Addendum)
Today's skin testing was all negative to environmental allergies and food testing but your positive control was negative as well.   Not sure what's going on with your coughing.  Continue with your speech therapy.  Continue with Protonix 40mg  daily. Continue reflux diet. Stop atrovent. Start Flonase 2 sprays daily. Start azelastine 2 sprays twice a day for post nasal drip.   Nasal saline spray (i.e., Simply Saline) or nasal saline lavage (i.e., NeilMed) is recommended as needed and prior to medicated nasal sprays.  Get CXR  Follow up in 1 month.

## 2018-09-21 MED FILL — FLUTICASONE PROP 50 MCG SPR: 50 | 30 days supply | Qty: 16 | Fill #0

## 2018-09-21 MED FILL — AZELASTINE HCL 137 MCG/SPRA: 137 | 25 days supply | Qty: 30 | Fill #0

## 2018-09-22 ENCOUNTER — Encounter: Payer: Self-pay | Admitting: Allergy

## 2018-09-22 ENCOUNTER — Telehealth: Payer: Self-pay | Admitting: Allergy

## 2018-09-22 NOTE — Telephone Encounter (Signed)
Patient said someone called her and is returning call.

## 2018-09-22 NOTE — Assessment & Plan Note (Addendum)
Persistent coughing for 20+ years. She had extensive work up with pulmonology - full body PFT and bronchoscopy which were both unremarkable. ENT evaluation was unremarkable and recommended cough behavioral therapy which she just started with some benefit. She has tried PPIs, antihistamines, various nasal sprays, gabapentin with minimal benefit. 2019 immunocap was negative to environmental allergies. Patient wants to get skin prick testing done.   Today's skin prick testing showed: negative to environmental panel and select foods however the positive control was negative as well questioning the validity of the results. Patient denies taking any type of antihistamines for the past 3 days.   Today's spirometry was normal.   No recent CXR and will obtain one.  Discussed with patient that not sure what is triggering her coughing. It is probably multifactorial in nature.   Strongly recommend that she continues with her therapy.   Continue with Protonix 40mg  daily. Continue reflux diet.  Stop Atrovent.  Start Flonase 2 sprays daily.  Start azelastine 2 sprays twice a day for post nasal drip.   Start Nasal saline spray (i.e., Simply Saline) or nasal saline lavage (i.e., NeilMed) is recommended as needed and prior to medicated nasal sprays.

## 2018-09-22 NOTE — Assessment & Plan Note (Signed)
.   See assessment and plan as above. 

## 2018-10-05 MED FILL — VERAPAMIL 80 MG TABLET: 80 | 30 days supply | Qty: 60 | Fill #2

## 2018-10-12 MED FILL — CARVEDILOL 3.125 MG TABLET: 3.125 | 90 days supply | Qty: 180 | Fill #0

## 2018-10-12 MED FILL — TRUE METRIX TEST STRIP: 30 days supply | Qty: 100 | Fill #8

## 2018-10-19 ENCOUNTER — Other Ambulatory Visit: Payer: BLUE CROSS/BLUE SHIELD

## 2018-10-19 ENCOUNTER — Encounter: Payer: Self-pay | Admitting: Family Medicine

## 2018-10-19 ENCOUNTER — Ambulatory Visit: Payer: BLUE CROSS/BLUE SHIELD | Attending: Family Medicine | Admitting: Family Medicine

## 2018-10-19 VITALS — BP 142/85 | HR 62 | Temp 98.1°F | Ht 62.0 in | Wt 151.0 lb

## 2018-10-19 DIAGNOSIS — M545 Low back pain, unspecified: Secondary | ICD-10-CM

## 2018-10-19 DIAGNOSIS — N183 Chronic kidney disease, stage 3 unspecified: Secondary | ICD-10-CM

## 2018-10-19 DIAGNOSIS — E1122 Type 2 diabetes mellitus with diabetic chronic kidney disease: Secondary | ICD-10-CM

## 2018-10-19 DIAGNOSIS — R05 Cough: Secondary | ICD-10-CM | POA: Diagnosis not present

## 2018-10-19 DIAGNOSIS — I10 Essential (primary) hypertension: Secondary | ICD-10-CM

## 2018-10-19 DIAGNOSIS — G8929 Other chronic pain: Secondary | ICD-10-CM | POA: Diagnosis not present

## 2018-10-19 DIAGNOSIS — R058 Other specified cough: Secondary | ICD-10-CM

## 2018-10-19 LAB — POCT GLYCOSYLATED HEMOGLOBIN (HGB A1C): HbA1c, POC (controlled diabetic range): 6.6 % (ref 0.0–7.0)

## 2018-10-19 LAB — GLUCOSE, POCT (MANUAL RESULT ENTRY): POC Glucose: 173 mg/dl — AB (ref 70–99)

## 2018-10-19 MED ORDER — HYDROCORTISONE 1 % EX OINT: 1.0000 "application " | TOPICAL_OINTMENT | Freq: Two times a day (BID) | CUTANEOUS | 1 refills | Status: DC

## 2018-10-19 MED ORDER — MONTELUKAST SODIUM 10 MG PO TABS: 10.0000 mg | ORAL_TABLET | Freq: Every day | ORAL | 5 refills | Status: DC

## 2018-10-19 MED ORDER — GABAPENTIN 300 MG PO CAPS: 300.0000 mg | ORAL_CAPSULE | Freq: Every day | ORAL | 3 refills | Status: DC

## 2018-10-19 MED ORDER — LIDOCAINE 5 % EX PTCH: 1.0000 | MEDICATED_PATCH | CUTANEOUS | 1 refills | Status: DC

## 2018-10-19 MED ORDER — PANTOPRAZOLE SODIUM 40 MG PO TBEC: 40.0000 mg | DELAYED_RELEASE_TABLET | Freq: Every day | ORAL | 5 refills | Status: DC

## 2018-10-19 MED ORDER — ALBUTEROL SULFATE HFA 108 (90 BASE) MCG/ACT IN AERS: 2.0000 | INHALATION_SPRAY | Freq: Four times a day (QID) | RESPIRATORY_TRACT | 2 refills | Status: DC | PRN

## 2018-10-19 MED FILL — PANTOPRAZOLE SOD DR 40 MG T: 40 | 30 days supply | Qty: 30 | Fill #0

## 2018-10-19 MED FILL — GABAPENTIN 300 MG CAPSULE: 300 | 30 days supply | Qty: 30 | Fill #0

## 2018-10-19 MED FILL — VENTOLIN HFA 90 MCG INHALER: 108 (90 BAS | 25 days supply | Qty: 18 | Fill #0

## 2018-10-19 MED FILL — MONTELUKAST SOD 10 MG TAB: 10 | 30 days supply | Qty: 30 | Fill #0

## 2018-10-19 NOTE — Progress Notes (Signed)
Subjective:  Patient ID: Paula [name], female    DOB: 1945-11-18  Age: 73 y.o. 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  MRA from 07/2018 revealed: IMPRESSION: Stable LEFT PCOM aneurysm, 4 x 3 mm.  Stable intracranial atherosclerotic disease, without flow-limiting stenosis.  No acute intracranial findings.  Chronic changes as described.   Today she complains of low back pain for the last 1 month which is absent at this time but when it occurs gets as severe as 9/10, prevents her from standing up straight and she has to bend for some time prior to maintaining an upright position.  Pain does not radiate down her lower extremities, is not associated with weakness of her legs, falls or loss of sphincteric function.  She continues to experience coughing spells which prevent her from sleeping with associated production of whitish to yellowish phlegm.  Denies headaches as she states those have improved.  Recently seen by allergy and immunology last month and commenced on azelastine spray.  Also saw ENT last month and is currently undergoing cough behavioral therapy which was beneficial after the first session but no recent it has been ineffective.  Her diabetes is stable and she denies hypoglycemia, numbness of her extremities.  Doing well on her antihypertensive and other medications.  Past Medical History:  Diagnosis Date  . Allergic rhinitis   . Arthritis   . Asthma   . Brain aneurysm   . Chronic kidney disease    Stage 3  . Cough   . Diabetes mellitus   . Diastolic  CHF, chronic ([facility]) 10/11/2011  . GERD (gastroesophageal reflux disease)   . History of colon polyps 2012   tubular adenoma   . Hyperlipidemia   . Hypertension   . Neuropathy 10/11/2011  . PONV (postoperative nausea and vomiting)    one time after lymph node surgery    Past Surgical History:  Procedure Laterality Date  . CATARACT EXTRACTION     right eye  . IR 3D INDEPENDENT WKST  12/14/2017  . IR ANGIO INTRA EXTRACRAN SEL COM CAROTID INNOMINATE BILAT MOD SED  09/15/2017  . IR ANGIO INTRA EXTRACRAN SEL COM CAROTID INNOMINATE UNI L MOD SED  12/14/2017  . IR ANGIO VERTEBRAL SEL VERTEBRAL BILAT MOD SED  09/15/2017  . IR RADIOLOGIST EVAL & MGMT  09/10/2017  . IR RADIOLOGIST EVAL & MGMT  10/19/2017  . lymphatic mass surgery    . lymphoma mass surgery Bilateral    non cancerous  . NASAL TURBINATE REDUCTION    . RADIOLOGY WITH ANESTHESIA N/A 12/14/2017   Procedure: RADIOLOGY WITH ANESTHESIA EMBOLIZATION;  Surgeon: Luanne Bras, MD;  Location: [facility];  Service: Radiology;  Laterality: N/A;  . VIDEO BRONCHOSCOPY Bilateral 11/18/2017   Procedure: VIDEO BRONCHOSCOPY WITHOUT FLUORO;  Surgeon: Collene Gobble, MD;  Location: [facility];  Service: Cardiopulmonary;  Laterality: Bilateral;    Family History  Problem Relation Age of Onset  . Hypertension Mother   . Asthma Sister   . Colon cancer Neg Hx   . Allergic rhinitis Neg Hx   . Eczema Neg Hx  Allergies  Allergen Reactions  . Dust Mite Extract Cough  . Ace Inhibitors Cough  . Lovastatin Other (See Comments)    Generalized body pain    Outpatient Medications Prior to Visit  Medication Sig Dispense Refill  . acetaminophen (TYLENOL) 500 MG tablet Take 500 mg by mouth every 6 (six) hours as needed for mild pain.    Marland Kitchen aspirin 81 MG tablet Take 81 mg by mouth every evening.     Marland Kitchen azelastine (ASTELIN) 0.1 % nasal spray Place 2 sprays into both nostrils 2 (two) times daily. 30 mL 5  . Bitter Melon Extract 10 % POWD Take 1 Scoop by mouth  daily as needed (high blood sugar). Mix with water    . Blood Glucose Monitoring Suppl (TRUE METRIX METER) w/Device KIT Use as directed once daily 1 kit 0  . calcium-vitamin D (OSCAL WITH D) 250-125 MG-UNIT tablet Take 1 tablet by mouth daily.    . carvedilol (COREG) 3.125 MG tablet TAKE 1 TABLET BY MOUTH TWICE DAILY WITH A MEAL (Patient taking differently: Take 3.125 mg by mouth 2 (two) times daily with a meal. ) 180 tablet 1  . cetirizine (ZYRTEC) 10 MG tablet Take 1 tablet (10 mg total) by mouth daily. 30 tablet 1  . diclofenac sodium (VOLTAREN) 1 % GEL Apply 4 g topically 4 (four) times daily. 100 g 3  . fluticasone (FLONASE) 50 MCG/ACT nasal spray Place 2 sprays into both nostrils daily. 16 g 5  . glipiZIDE (GLUCOTROL) 10 MG tablet Take 1 tablet (10 mg total) by mouth 2 (two) times daily before a meal. 180 tablet 1  . glucose blood (ONE TOUCH ULTRA TEST) test strip Use as instructed 3 times daily before meals 100 each 12  . glucose blood (TRUE METRIX BLOOD GLUCOSE TEST) test strip Use as instructed TID 100 each 12  . Homeopathic Products (EARACHE DROPS) SOLN Place 1 drop in ear(s) daily.    . isosorbide mononitrate (IMDUR) 60 MG 24 hr tablet Take 1 tablet (60 mg total) by mouth daily. 90 tablet 1  . Lancets (ONETOUCH ULTRASOFT) lancets Use as instructed 3 times daily before meals 100 each 12  . Polyethyl Glycol-Propyl Glycol (SYSTANE OP) Place 1 drop into both eyes daily as needed (dry eyes).    . rosuvastatin (CRESTOR) 5 MG tablet Take 1 tablet (5 mg total) by mouth daily. (Patient taking differently: Take 5 mg by mouth every evening. ) 90 tablet 1  . verapamil (CALAN) 80 MG tablet Take 1 tablet (80 mg total) by mouth 2 (two) times daily. 180 tablet 1  . benzonatate (TESSALON) 100 MG capsule Take 1 capsule (100 mg total) by mouth 3 (three) times daily as needed for cough. (Patient not taking: Reported on 09/20/2018) 30 capsule 0  . famotidine (PEPCID) 20 MG tablet Take 1 tablet (20 mg total) by  mouth 2 (two) times daily. (Patient not taking: Reported on 07/16/2018) 60 tablet 1  . fluticasone (FLONASE) 50 MCG/ACT nasal spray Place 2 sprays into both nostrils daily. (Patient not taking: Reported on 07/16/2018) 16 g 5  . lidocaine (LIDODERM) 5 % Place 1 patch onto the skin daily. Remove & Discard patch within 12 hours or as directed by MD (Patient not taking: Reported on 09/20/2018) 30 patch 1  . montelukast (SINGULAIR) 10 MG tablet Take 1 tablet (10 mg total) by mouth at bedtime. (Patient not taking: Reported on 09/20/2018) 30 tablet 5  . pantoprazole (PROTONIX) 40 MG tablet Take 1 tablet (40  mg total) by mouth daily. (Patient not taking: Reported on 10/19/2018) 30 tablet 5   No facility-administered medications prior to visit.      ROS Review of Systems  Constitutional: Negative for activity change, appetite change and fatigue.  HENT: Negative for congestion, sinus pressure and sore throat.   Eyes: Negative for visual disturbance.  Respiratory: Positive for cough. Negative for chest tightness, shortness of breath and wheezing.   Cardiovascular: Negative for chest pain and palpitations.  Gastrointestinal: Negative for abdominal distention, abdominal pain and constipation.  Endocrine: Negative for polydipsia.  Genitourinary: Negative for dysuria and frequency.  Musculoskeletal: Positive for back pain. Negative for arthralgias.  Skin: Negative for rash.  Neurological: Negative for tremors, light-headedness and numbness.  Hematological: Does not bruise/bleed easily.  Psychiatric/Behavioral: Negative for agitation and behavioral problems.    Objective:  BP (!) 142/85   Pulse 62   Temp 98.1 F (36.7 C) (Oral)   Ht 5' 2"  (1.575 m)   Wt 151 lb (68.5 kg)   LMP  (LMP Unknown)   SpO2 99%   BMI 27.62 kg/m   BP/Weight 10/19/2018 09/20/2018 08/16/2692  Systolic BP 854 627 035  Diastolic BP 85 90 91  Wt. (Lbs) 151 154.8 155  BMI 27.62 28.31 28.35      Physical Exam Constitutional:       Appearance: She is well-developed.  HENT:     Right Ear: Tympanic membrane normal.     Left Ear: Tympanic membrane normal.     Mouth/Throat:     Comments: Slight oropharyngeal erythema Cardiovascular:     Rate and Rhythm: Normal rate.     Heart sounds: Normal heart sounds. No murmur.  Pulmonary:     Effort: Pulmonary effort is normal.     Breath sounds: Normal breath sounds. No wheezing or rales.  Chest:     Chest wall: No tenderness.  Abdominal:     General: Bowel sounds are normal. There is no distension.     Palpations: Abdomen is soft. There is no mass.     Tenderness: There is no abdominal tenderness.  Musculoskeletal:     Comments: No tenderness to palpation of lumbar spine; negative straight leg raise bilaterally  Neurological:     Mental Status: She is alert and oriented to person, place, and time.  Psychiatric:        Mood and Affect: Mood normal.     CMP Latest Ref Rng & Units 09/02/2018 07/16/2018 05/12/2018  Glucose 65 - 99 mg/dL 319(H) 165(H) 93  BUN 8 - 27 mg/dL 30(H) 27(H) 29(H)  Creatinine 0.57 - 1.00 mg/dL 2.08(H) 1.88(H) 1.74(H)  Sodium 134 - 144 mmol/L 136 140 137  Potassium 3.5 - 5.2 mmol/L 4.0 3.8 3.6  Chloride 96 - 106 mmol/L 95(L) 104 102  CO2 20 - 29 mmol/L 22 28 26   Calcium 8.7 - 10.3 mg/dL 9.0 8.8(L) 9.4  Total Protein 6.0 - 8.5 g/dL 6.6 - -  Total Bilirubin 0.0 - 1.2 mg/dL 0.3 - -  Alkaline Phos 39 - 117 IU/L 76 - -  AST 0 - 40 IU/L 19 - -  ALT 0 - 32 IU/L 17 - -    Lipid Panel     Component Value Date/Time   CHOL 145 05/08/2017 0845   TRIG 90 05/08/2017 0845   HDL 46 05/08/2017 0845   CHOLHDL 3.2 05/08/2017 0845   CHOLHDL 2.9 01/25/2016 0901   VLDL 20 01/25/2016 0901   LDLCALC 81 05/08/2017 0845  CBC    Component Value Date/Time   WBC 5.1 09/02/2018 1211   WBC 5.4 07/16/2018 1145   RBC 4.28 09/02/2018 1211   RBC 4.13 07/16/2018 1145   HGB 12.4 09/02/2018 1211   HCT 37.3 09/02/2018 1211   PLT 203 09/02/2018 1211   MCV 87  09/02/2018 1211   MCH 29.0 09/02/2018 1211   MCH 29.1 07/16/2018 1145   MCHC 33.2 09/02/2018 1211   MCHC 33.7 07/16/2018 1145   RDW 13.1 09/02/2018 1211   LYMPHSABS 1.2 09/02/2018 1211   MONOABS 0.5 07/16/2018 1145   EOSABS 0.1 09/02/2018 1211   BASOSABS 0.0 09/02/2018 1211    Lab Results  Component Value Date   HGBA1C 6.6 10/19/2018    Assessment & Plan:   1. Type 2 diabetes mellitus with stage 3 chronic kidney disease, without long-term current use of insulin (HCC) Diabetes mellitus is controlled Continue current management Chronic kidney disease followed by nephrology at [facility] Counseled on Diabetic diet, my plate method, 476 minutes of moderate intensity exercise/week Keep blood sugar logs with fasting goals of 80-120 mg/dl, random of less than 180 and in the event of sugars less than 60 mg/dl or greater than 400 mg/dl please notify the clinic ASAP. It is recommended that you undergo annual eye exams and annual foot exams. Pneumonia vaccine is recommended. - POCT glucose (manual entry) - POCT glycosylated hemoglobin (Hb A1C) - Lipid panel; Future - Microalbumin/Creatinine Ratio, Urine; Future  2. Upper airway cough syndrome Uncontrolled Currently undergoing cough behavioral therapy Review of her med list indicates she has not been taking Singulair which I have refilled We will add Proventil to her regimen Continue azelastine spray prescribed by allergy and immunology and keep appointment with them Also advised to keep appointment with ENT - albuterol (PROVENTIL HFA;VENTOLIN HFA) 108 (90 Base) MCG/ACT inhaler; Inhale 2 puffs into the lungs every 6 (six) hours as needed for wheezing or shortness of breath.  Dispense: 1 Inhaler; Refill: 2 - montelukast (SINGULAIR) 10 MG tablet; Take 1 tablet (10 mg total) by mouth at bedtime.  Dispense: 30 tablet; Refill: 5 - pantoprazole (PROTONIX) 40 MG tablet; Take 1 tablet (40 mg total) by mouth daily.  Dispense: 30  tablet; Refill: 5  3. Chronic midline low back pain without sciatica Pain is absent at this time Unable to use NSAIDs due to CKD Continue Voltaren gel, Lidoderm patch, will add on gabapentin - gabapentin (NEURONTIN) 300 MG capsule; Take 1 capsule (300 mg total) by mouth at bedtime.  Dispense: 30 capsule; Refill: 3 - lidocaine (LIDODERM) 5 %; Place 1 patch onto the skin daily. Remove & Discard patch within 12 hours or as directed by MD  Dispense: 30 patch; Refill: 1  4. CKD (chronic kidney disease) stage 3, GFR 30-59 ml/min (HCC) Stable See #1 above  5. Essential hypertension Blood pressure is almost at goal No regimen change today Counseled on blood pressure goal of less than 130/80, low-sodium, DASH diet, medication compliance, 150 minutes of moderate intensity exercise per week. Discussed medication compliance, adverse effects.    Meds ordered this encounter  Medications  . gabapentin (NEURONTIN) 300 MG capsule    Sig: Take 1 capsule (300 mg total) by mouth at bedtime.    Dispense:  30 capsule    Refill:  3  . albuterol (PROVENTIL HFA;VENTOLIN HFA) 108 (90 Base) MCG/ACT inhaler    Sig: Inhale 2 puffs into the lungs every 6 (six) hours as needed for wheezing or shortness of  breath.    Dispense:  1 Inhaler    Refill:  2  . montelukast (SINGULAIR) 10 MG tablet    Sig: Take 1 tablet (10 mg total) by mouth at bedtime.    Dispense:  30 tablet    Refill:  5  . hydrocortisone 1 % ointment    Sig: Apply 1 application topically 2 (two) times daily.    Dispense:  30 g    Refill:  1  . pantoprazole (PROTONIX) 40 MG tablet    Sig: Take 1 tablet (40 mg total) by mouth daily.    Dispense:  30 tablet    Refill:  5  . lidocaine (LIDODERM) 5 %    Sig: Place 1 patch onto the skin daily. Remove & Discard patch within 12 hours or as directed by MD    Dispense:  30 patch    Refill:  1    Follow-up: Return in about 3 months (around 01/19/2019) for Follow-up of chronic medical conditions.         Charlott Rakes, MD, FAAFP. [facility] [place], [place]   10/19/2018, 12:19 PM

## 2018-10-19 NOTE — Progress Notes (Signed)
Patient is having pain in lower back.

## 2018-10-19 NOTE — Patient Instructions (Signed)

## 2018-10-22 ENCOUNTER — Ambulatory Visit: Payer: BLUE CROSS/BLUE SHIELD | Attending: Family Medicine

## 2018-10-22 ENCOUNTER — Other Ambulatory Visit: Payer: Self-pay

## 2018-10-22 DIAGNOSIS — N183 Chronic kidney disease, stage 3 unspecified: Secondary | ICD-10-CM

## 2018-10-22 DIAGNOSIS — E1122 Type 2 diabetes mellitus with diabetic chronic kidney disease: Secondary | ICD-10-CM

## 2018-10-23 LAB — LIPID PANEL
Chol/HDL Ratio: 3.2 ratio (ref 0.0–4.4)
Cholesterol, Total: 139 mg/dL (ref 100–199)
HDL: 44 mg/dL (ref >39)
LDL Calculated: 77 mg/dL (ref 0–99)
Triglycerides: 88 mg/dL (ref 0–149)
VLDL Cholesterol Cal: 18 mg/dL (ref 5–40)

## 2018-10-23 LAB — MICROALBUMIN / CREATININE URINE RATIO
Creatinine, Urine: 41.3 mg/dL
Microalb/Creat Ratio: 1026 mg/g creat — ABNORMAL HIGH (ref 0–29)
Microalbumin, Urine: 423.9 ug/mL

## 2018-10-28 ENCOUNTER — Telehealth: Payer: Self-pay

## 2018-10-28 MED FILL — glipiZIDE 10 MG TABS: 10 | 60 days supply | Qty: 120 | Fill #1

## 2018-10-28 NOTE — Telephone Encounter (Signed)
-----   Message from Charlott Rakes, MD sent at 10/25/2018 11:28 AM EDT ----- Cholesterol is normal but she does have microalbuminuria which is due to her kidney problems.  Please advised to keep appointment with her nephrologist.

## 2018-10-29 MED FILL — VERAPAMIL 80 MG TABLET: 80 | 90 days supply | Qty: 180 | Fill #3

## 2018-10-29 NOTE — Telephone Encounter (Signed)
-----   Message from Charlott Rakes, MD sent at 10/25/2018 11:28 AM EDT ----- Cholesterol is normal but she does have microalbuminuria which is due to her kidney problems.  Please advised to keep appointment with her nephrologist.

## 2018-10-29 NOTE — Telephone Encounter (Signed)
Patient name and DOB has been verified Patient was informed of lab results. Patient had no questions.  

## 2018-11-05 MED FILL — ISOSORBIDE MN ER 60 MG TAB: 60 | 90 days supply | Qty: 90 | Fill #1

## 2018-11-08 MED FILL — GABAPENTIN 300 MG CAPSULE: 300 | 30 days supply | Qty: 30 | Fill #1

## 2018-11-11 MED FILL — GABAPENTIN 300 MG CAPSULE: 300 | 60 days supply | Qty: 60 | Fill #2

## 2018-11-11 MED FILL — AZELASTINE HCL 137 MCG SPRY: 0.1 | 25 days supply | Qty: 30 | Fill #1

## 2018-11-20 MED FILL — PANTOPRAZOLE SOD DR 40 MG T: 40 | 30 days supply | Qty: 30 | Fill #1

## 2018-11-20 MED FILL — ROSUVASTATIN CALCIUM 5 MG T: 5 | 90 days supply | Qty: 90 | Fill #1

## 2018-11-29 ENCOUNTER — Other Ambulatory Visit: Payer: Self-pay | Admitting: Pharmacist

## 2018-11-29 ENCOUNTER — Other Ambulatory Visit: Payer: Self-pay | Admitting: Family Medicine

## 2018-11-29 MED ORDER — MICROLET LANCETS MISC: 11 refills | Status: DC

## 2018-11-29 MED ORDER — CONTOUR NEXT MONITOR W/DEVICE KIT: 1.0000 | PACK | Freq: Three times a day (TID) | 0 refills | Status: DC

## 2018-11-29 MED FILL — MICROLET LANCETS MISC: 30 days supply | Qty: 100 | Fill #0

## 2018-11-29 MED FILL — CONTOUR NEXT STRIPS: 30 days supply | Qty: 100 | Fill #0

## 2018-11-29 MED FILL — CONTOUR NEXT METER: W/DEVICE | 30 days supply | Qty: 1 | Fill #0

## 2018-12-14 MED FILL — glipiZIDE 10 MG TABS: 10 | 60 days supply | Qty: 120 | Fill #2

## 2018-12-24 MED FILL — PANTOPRAZOLE SOD DR 40 MG T: 40 | 30 days supply | Qty: 30 | Fill #2

## 2019-01-04 MED FILL — CARVEDILOL 3.125 MG TABLET: 3.125 | 90 days supply | Qty: 180 | Fill #1

## 2019-01-04 MED FILL — MONTELUKAST SOD 10 MG TAB: 10 | 90 days supply | Qty: 90 | Fill #1

## 2019-01-07 MED FILL — CONTOUR NEXT STRIPS: 30 days supply | Qty: 100 | Fill #1

## 2019-01-19 MED FILL — PANTOPRAZOLE SOD DR 40 MG T: 40 | 90 days supply | Qty: 90 | Fill #3

## 2019-01-24 MED FILL — VENTOLIN HFA 90 MCG INHALER: 108 (90 BAS | 25 days supply | Qty: 18 | Fill #1

## 2019-01-31 ENCOUNTER — Other Ambulatory Visit: Payer: Self-pay | Admitting: Family Medicine

## 2019-01-31 DIAGNOSIS — I10 Essential (primary) hypertension: Secondary | ICD-10-CM

## 2019-02-01 ENCOUNTER — Other Ambulatory Visit: Payer: Self-pay | Admitting: Family Medicine

## 2019-02-01 DIAGNOSIS — M545 Low back pain, unspecified: Secondary | ICD-10-CM

## 2019-02-01 DIAGNOSIS — G8929 Other chronic pain: Secondary | ICD-10-CM

## 2019-02-01 MED FILL — ISOSORBIDE MN ER 60 MG TAB: 60 | 30 days supply | Qty: 30 | Fill #0

## 2019-02-01 MED FILL — VERAPAMIL 80 MG TABLET: 80 | 30 days supply | Qty: 60 | Fill #0

## 2019-02-02 MED FILL — GABAPENTIN 300 MG CAPSULE: 300 | 30 days supply | Qty: 30 | Fill #0

## 2019-02-03 ENCOUNTER — Ambulatory Visit: Payer: BLUE CROSS/BLUE SHIELD | Attending: Family Medicine | Admitting: Family Medicine

## 2019-02-03 ENCOUNTER — Other Ambulatory Visit: Payer: Self-pay

## 2019-02-03 ENCOUNTER — Telehealth: Payer: Self-pay | Admitting: Family Medicine

## 2019-02-03 VITALS — BP 173/65 | HR 51 | Temp 97.6°F | Ht 62.0 in | Wt 155.0 lb

## 2019-02-03 DIAGNOSIS — M25562 Pain in left knee: Secondary | ICD-10-CM

## 2019-02-03 DIAGNOSIS — E1122 Type 2 diabetes mellitus with diabetic chronic kidney disease: Secondary | ICD-10-CM | POA: Diagnosis not present

## 2019-02-03 DIAGNOSIS — R05 Cough: Secondary | ICD-10-CM

## 2019-02-03 DIAGNOSIS — J302 Other seasonal allergic rhinitis: Secondary | ICD-10-CM

## 2019-02-03 DIAGNOSIS — N183 Chronic kidney disease, stage 3 unspecified: Secondary | ICD-10-CM

## 2019-02-03 DIAGNOSIS — I10 Essential (primary) hypertension: Secondary | ICD-10-CM | POA: Diagnosis not present

## 2019-02-03 DIAGNOSIS — E7800 Pure hypercholesterolemia, unspecified: Secondary | ICD-10-CM

## 2019-02-03 DIAGNOSIS — R058 Other specified cough: Secondary | ICD-10-CM

## 2019-02-03 DIAGNOSIS — M5442 Lumbago with sciatica, left side: Secondary | ICD-10-CM

## 2019-02-03 DIAGNOSIS — G8929 Other chronic pain: Secondary | ICD-10-CM

## 2019-02-03 DIAGNOSIS — J3089 Other allergic rhinitis: Secondary | ICD-10-CM

## 2019-02-03 LAB — POCT GLYCOSYLATED HEMOGLOBIN (HGB A1C): HbA1c, POC (controlled diabetic range): 6.6 % (ref 0.0–7.0)

## 2019-02-03 LAB — GLUCOSE, POCT (MANUAL RESULT ENTRY): POC Glucose: 163 mg/dl — AB (ref 70–99)

## 2019-02-03 MED ORDER — METHOCARBAMOL 500 MG PO TABS: 500.0000 mg | ORAL_TABLET | Freq: Two times a day (BID) | ORAL | 1 refills | Status: DC | PRN

## 2019-02-03 MED ORDER — CETIRIZINE HCL 10 MG PO TABS: 10.0000 mg | ORAL_TABLET | Freq: Every day | ORAL | 1 refills | Status: DC

## 2019-02-03 MED ORDER — VERAPAMIL HCL 40 MG PO TABS: 40.0000 mg | ORAL_TABLET | Freq: Two times a day (BID) | ORAL | 1 refills | Status: DC

## 2019-02-03 MED ORDER — GABAPENTIN 300 MG PO CAPS: 300.0000 mg | ORAL_CAPSULE | Freq: Every day | ORAL | 1 refills | Status: DC

## 2019-02-03 MED ORDER — ALBUTEROL SULFATE HFA 108 (90 BASE) MCG/ACT IN AERS: 2.0000 | INHALATION_SPRAY | Freq: Four times a day (QID) | RESPIRATORY_TRACT | 3 refills | Status: DC | PRN

## 2019-02-03 MED ORDER — ROSUVASTATIN CALCIUM 5 MG PO TABS: 5.0000 mg | ORAL_TABLET | Freq: Every evening | ORAL | 1 refills | Status: DC

## 2019-02-03 MED ORDER — GLIPIZIDE 10 MG PO TABS: 10.0000 mg | ORAL_TABLET | Freq: Two times a day (BID) | ORAL | 1 refills | Status: DC

## 2019-02-03 MED ORDER — ISOSORBIDE MONONITRATE ER 60 MG PO TB24: 60.0000 mg | ORAL_TABLET | Freq: Every day | ORAL | 1 refills | Status: DC

## 2019-02-03 MED ORDER — AZELASTINE HCL 0.1 % NA SOLN: 2.0000 | Freq: Two times a day (BID) | NASAL | 5 refills | Status: DC

## 2019-02-03 MED ORDER — CARVEDILOL 6.25 MG PO TABS: 6.2500 mg | ORAL_TABLET | Freq: Two times a day (BID) | ORAL | 1 refills | Status: DC

## 2019-02-03 MED ORDER — PANTOPRAZOLE SODIUM 40 MG PO TBEC: 40.0000 mg | DELAYED_RELEASE_TABLET | Freq: Every day | ORAL | 1 refills | Status: DC

## 2019-02-03 MED FILL — VERAPAMIL HCL 40 MG TABS: 40 | 30 days supply | Qty: 60 | Fill #0

## 2019-02-03 MED FILL — METHOCARBAMOL 500 MG TABS: 500 | 30 days supply | Qty: 60 | Fill #0

## 2019-02-03 MED FILL — glipiZIDE 10 MG TABS: 10 | 30 days supply | Qty: 60 | Fill #0

## 2019-02-03 MED FILL — CARVEDILOL 6.25 MG TABLET: 6.25 | 30 days supply | Qty: 60 | Fill #0

## 2019-02-03 MED FILL — AZELASTINE HCL 137 MCG SPRY: 0.1 | 30 days supply | Qty: 30 | Fill #0

## 2019-02-03 NOTE — Progress Notes (Signed)
Patient is having lower back pain and leg pain.

## 2019-02-03 NOTE — Progress Notes (Signed)
Subjective:  Patient ID: Paula [name], female    DOB: [birth year]  Age: 73 y.o. 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  One week ago she suddenly noticed her chronic cough had resolved and she endorses resolution of her headache as well.  She states the cough behavioral therapy at [facility] was not too effective but she had complied with performing the exercises she had been taught.  She continues to use a nasal spray, Singulair and Zyrtec.  With regards to her diabetes mellitus her sugars have been controlled, she endorses exercising and adhering to a diabetic diet; she denies neuropathy.  She has no visual concerns but is not up-to-date on annual eye exam due to the fact that her insurance will not pay. Her blood pressure is elevated and she endorses compliance with her antihypertensive.  Today she complains of left-sided back pain which radiates down her left lower extremity and is described as moderate.  This sometimes prevents her from walking.  She also has intermittent left knee pain with associated crepitus but denies swelling of her knee.  She denies falls, paresthesia or loss of sphincteric function.  Past Medical History:  Diagnosis Date  . Allergic rhinitis   . Arthritis   . Asthma   . Brain aneurysm   . Chronic kidney disease    Stage 3  . Cough   . Diabetes mellitus   . Diastolic CHF, chronic ([place]) 10/11/2011  . GERD (gastroesophageal reflux disease)   . History of colon polyps 2012   tubular adenoma   . Hyperlipidemia   . Hypertension   .  Neuropathy 10/11/2011  . PONV (postoperative nausea and vomiting)    one time after lymph node surgery    Past Surgical History:  Procedure Laterality Date  . CATARACT EXTRACTION     right eye  . IR 3D INDEPENDENT WKST  12/14/2017  . IR ANGIO INTRA EXTRACRAN SEL COM CAROTID INNOMINATE BILAT MOD SED  09/15/2017  . IR ANGIO INTRA EXTRACRAN SEL COM CAROTID INNOMINATE UNI L MOD SED  12/14/2017  . IR ANGIO VERTEBRAL SEL VERTEBRAL BILAT MOD SED  09/15/2017  . IR RADIOLOGIST EVAL & MGMT  09/10/2017  . IR RADIOLOGIST EVAL & MGMT  10/19/2017  . lymphatic mass surgery    . lymphoma mass surgery Bilateral    non cancerous  . NASAL TURBINATE REDUCTION    . RADIOLOGY WITH ANESTHESIA N/A 12/14/2017   Procedure: RADIOLOGY WITH ANESTHESIA EMBOLIZATION;  Surgeon: Luanne Bras, MD;  Location: [facility];  Service: Radiology;  Laterality: N/A;  . VIDEO BRONCHOSCOPY Bilateral 11/18/2017   Procedure: VIDEO BRONCHOSCOPY WITHOUT FLUORO;  Surgeon: Collene Gobble, MD;  Location: [facility];  Service: Cardiopulmonary;  Laterality: Bilateral;    Family History  Problem Relation Age of Onset  . Hypertension Mother   . Asthma Sister   . Colon cancer Neg Hx   . Allergic rhinitis Neg Hx   . Eczema Neg Hx     Allergies  Allergen Reactions  . Dust Mite Extract Cough  . Ace Inhibitors Cough  . Lovastatin Other (See Comments)  Generalized body pain    Outpatient Medications Prior to Visit  Medication Sig Dispense Refill  . acetaminophen (TYLENOL) 500 MG tablet Take 500 mg by mouth every 6 (six) hours as needed for mild pain.    Marland Kitchen aspirin 81 MG tablet Take 81 mg by mouth every evening.     Theron Arista Melon Extract 10 % POWD Take 1 Scoop by mouth daily as needed (high blood sugar). Mix with water    . Blood Glucose Monitoring Suppl (CONTOUR NEXT MONITOR) w/Device KIT 1 kit by Does not apply route 3 (three) times daily. 1 kit 0  . calcium-vitamin D (OSCAL WITH D) 250-125 MG-UNIT tablet Take 1 tablet by mouth daily.     . diclofenac sodium (VOLTAREN) 1 % GEL Apply 4 g topically 4 (four) times daily. 100 g 3  . fluticasone (FLONASE) 50 MCG/ACT nasal spray Place 2 sprays into both nostrils daily. 16 g 5  . glucose blood test strip USE AS DIRECTED 3 TIMES DAILY. 100 each 12  . Homeopathic Products (EARACHE DROPS) SOLN Place 1 drop in ear(s) daily.    . hydrocortisone 1 % ointment Apply 1 application topically 2 (two) times daily. 30 g 1  . lidocaine (LIDODERM) 5 % Place 1 patch onto the skin daily. Remove & Discard patch within 12 hours or as directed by MD 30 patch 1  . Microlet Lancets MISC Use to check blood sugar up to 3 times daily. 100 each 11  . montelukast (SINGULAIR) 10 MG tablet Take 1 tablet (10 mg total) by mouth at bedtime. 30 tablet 5  . Polyethyl Glycol-Propyl Glycol (SYSTANE OP) Place 1 drop into both eyes daily as needed (dry eyes).    Marland Kitchen albuterol (PROVENTIL HFA;VENTOLIN HFA) 108 (90 Base) MCG/ACT inhaler Inhale 2 puffs into the lungs every 6 (six) hours as needed for wheezing or shortness of breath. 1 Inhaler 2  . azelastine (ASTELIN) 0.1 % nasal spray Place 2 sprays into both nostrils 2 (two) times daily. 30 mL 5  . carvedilol (COREG) 3.125 MG tablet TAKE 1 TABLET BY MOUTH TWICE DAILY WITH A MEAL (Patient taking differently: Take 3.125 mg by mouth 2 (two) times daily with a meal. ) 180 tablet 1  . cetirizine (ZYRTEC) 10 MG tablet Take 1 tablet (10 mg total) by mouth daily. 30 tablet 1  . gabapentin (NEURONTIN) 300 MG capsule TAKE 1 CAPSULE (300 MG TOTAL) BY MOUTH AT BEDTIME. 30 capsule 3  . glipiZIDE (GLUCOTROL) 10 MG tablet Take 1 tablet (10 mg total) by mouth 2 (two) times daily before a meal. 180 tablet 1  . isosorbide mononitrate (IMDUR) 60 MG 24 hr tablet TAKE 1 TABLET BY MOUTH DAILY. 30 tablet 0  . pantoprazole (PROTONIX) 40 MG tablet Take 1 tablet (40 mg total) by mouth daily. 30 tablet 5  . rosuvastatin (CRESTOR) 5 MG tablet Take 1 tablet (5 mg total) by mouth daily. (Patient taking  differently: Take 5 mg by mouth every evening. ) 90 tablet 1  . verapamil (CALAN) 80 MG tablet TAKE 1 TABLET BY MOUTH TWICE DAILY. 60 tablet 0  . benzonatate (TESSALON) 100 MG capsule Take 1 capsule (100 mg total) by mouth 3 (three) times daily as needed for cough. (Patient not taking: Reported on 09/20/2018) 30 capsule 0  . fluticasone (FLONASE) 50 MCG/ACT nasal spray Place 2 sprays into both nostrils daily. (Patient not taking: Reported on 07/16/2018) 16 g 5  . famotidine (PEPCID) 20 MG tablet Take 1 tablet (20 mg  total) by mouth 2 (two) times daily. (Patient not taking: Reported on 07/16/2018) 60 tablet 1   No facility-administered medications prior to visit.      ROS Review of Systems  Constitutional: Negative for activity change, appetite change and fatigue.  HENT: Negative for congestion, sinus pressure and sore throat.   Eyes: Negative for visual disturbance.  Respiratory: Negative for cough, chest tightness, shortness of breath and wheezing.   Cardiovascular: Negative for chest pain and palpitations.  Gastrointestinal: Negative for abdominal distention, abdominal pain and constipation.  Endocrine: Negative for polydipsia.  Genitourinary: Negative for dysuria and frequency.  Musculoskeletal:       See hpi  Skin: Negative for rash.  Neurological: Negative for tremors, light-headedness and numbness.  Hematological: Does not bruise/bleed easily.  Psychiatric/Behavioral: Negative for agitation and behavioral problems.    Objective:  BP (!) 173/65   Pulse (!) 51   Temp 97.6 F (36.4 C) (Oral)   Ht 5' 2" (1.575 m)   Wt 155 lb (70.3 kg)   LMP  (LMP Unknown)   SpO2 98%   BMI 28.35 kg/m   BP/Weight 02/03/2019 10/19/2018 05/01/1940  Systolic BP 740 814 481  Diastolic BP 65 85 90  Wt. (Lbs) 155 151 154.8  BMI 28.35 27.62 28.31      Physical Exam Constitutional:      Appearance: She is well-developed.  Cardiovascular:     Rate and Rhythm: Bradycardia present.     Heart  sounds: Normal heart sounds. No murmur.  Pulmonary:     Effort: Pulmonary effort is normal.     Breath sounds: Normal breath sounds. No wheezing or rales.  Chest:     Chest wall: No tenderness.  Abdominal:     General: Bowel sounds are normal. There is no distension.     Palpations: Abdomen is soft. There is no mass.     Tenderness: There is no abdominal tenderness.  Musculoskeletal: Normal range of motion.     Comments: Negative straight leg raise bilaterally Tenderness to palpation of left lumbar spine Slight crepitus on range of motion of left knee with associated tenderness.  No edema of the knee noted. Right knee is normal  Neurological:     Mental Status: She is alert and oriented to person, place, and time.     Motor: No weakness.     Coordination: Coordination normal.     Gait: Gait normal.     Deep Tendon Reflexes: Reflexes normal.     CMP Latest Ref Rng & Units 09/02/2018 07/16/2018 05/12/2018  Glucose 65 - 99 mg/dL 319(H) 165(H) 93  BUN 8 - 27 mg/dL 30(H) 27(H) 29(H)  Creatinine 0.57 - 1.00 mg/dL 2.08(H) 1.88(H) 1.74(H)  Sodium 134 - 144 mmol/L 136 140 137  Potassium 3.5 - 5.2 mmol/L 4.0 3.8 3.6  Chloride 96 - 106 mmol/L 95(L) 104 102  CO2 20 - 29 mmol/L _0 Calcium 8.7 - 10.3 mg/dL 9.0 8.8(L) 9.4  Total Protein 6.0 - 8.5 g/dL 6.6 - -  Total Bilirubin 0.0 - 1.2 mg/dL 0.3 - -  Alkaline Phos 39 - 117 IU/L 76 - -  AST 0 - 40 IU/L 19 - -  ALT 0 - 32 IU/L 17 - -    Lipid Panel     Component Value Date/Time   CHOL 139 10/22/2018 1041   TRIG 88 10/22/2018 1041   HDL 44 10/22/2018 1041   CHOLHDL 3.2 10/22/2018 1041   CHOLHDL 2.9 01/25/2016 0901  VLDL 20 01/25/2016 0901   LDLCALC 77 10/22/2018 1041    CBC    Component Value Date/Time   WBC 5.1 09/02/2018 1211   WBC 5.4 07/16/2018 1145   RBC 4.28 09/02/2018 1211   RBC 4.13 07/16/2018 1145   HGB 12.4 09/02/2018 1211   HCT 37.3 09/02/2018 1211   PLT 203 09/02/2018 1211   MCV 87 09/02/2018 1211   MCH  29.0 09/02/2018 1211   MCH 29.1 07/16/2018 1145   MCHC 33.2 09/02/2018 1211   MCHC 33.7 07/16/2018 1145   RDW 13.1 09/02/2018 1211   LYMPHSABS 1.2 09/02/2018 1211   MONOABS 0.5 07/16/2018 1145   EOSABS 0.1 09/02/2018 1211   BASOSABS 0.0 09/02/2018 1211    Lab Results  Component Value Date   HGBA1C 6.6 02/03/2019    Assessment & Plan:   1. Type 2 diabetes mellitus with other specified complication, without long-term current use of insulin (HCC) Controlled with A1c of 6.6 She is due for an ophthalmology exam however is unable to afford this as insurance will not cover; she is trying to obtain this through the [facility] at next visit Counseled on Diabetic diet, my plate method, 314 minutes of moderate intensity exercise/week Keep blood sugar logs with fasting goals of 80-120 mg/dl, random of less than 180 and in the event of sugars less than 60 mg/dl or greater than 400 mg/dl please notify the clinic ASAP. It is recommended that you undergo annual eye exams and annual foot exams. Pneumonia vaccine is recommended. - POCT glucose (manual entry) - POCT glycosylated hemoglobin (Hb A1C) - glipiZIDE (GLUCOTROL) 10 MG tablet; Take 1 tablet (10 mg total) by mouth 2 (two) times daily before a meal.  Dispense: 180 tablet; Refill: 1 - CMP14+EGFR  2. Chronic pain of left knee Likely underlying osteoarthritis Will benefit from PT Unable to use NSAID due to CKD  3. Essential hypertension Uncontrolled Increase carvedilol dose and decreased dose of verapamil as she is bradycardic Counseled on blood pressure goal of less than 130/80, low-sodium, DASH diet, medication compliance, 150 minutes of moderate intensity exercise per week. Discussed medication compliance, adverse effects. - carvedilol (COREG) 6.25 MG tablet; Take 1 tablet (6.25 mg total) by mouth 2 (two) times daily with a meal.  Dispense: 180 tablet; Refill: 1 - isosorbide mononitrate (IMDUR) 60 MG 24 hr tablet; Take 1 tablet  (60 mg total) by mouth daily.  Dispense: 90 tablet; Refill: 1 - verapamil (CALAN) 40 MG tablet; Take 1 tablet (40 mg total) by mouth 2 (two) times daily.  Dispense: 90 tablet; Refill: 1  4. Seasonal and perennial allergic rhinitis Stable - cetirizine (ZYRTEC) 10 MG tablet; Take 1 tablet (10 mg total) by mouth daily.  Dispense: 30 tablet; Refill: 1  5. Upper airway cough syndrome Cough has resolved - albuterol (VENTOLIN HFA) 108 (90 Base) MCG/ACT inhaler; Inhale 2 puffs into the lungs every 6 (six) hours as needed for wheezing or shortness of breath.  Dispense: 18 g; Refill: 3 - pantoprazole (PROTONIX) 40 MG tablet; Take 1 tablet (40 mg total) by mouth daily.  Dispense: 90 tablet; Refill: 1  6. Pure hypercholesterolemia Controlled Low-cholesterol diet - rosuvastatin (CRESTOR) 5 MG tablet; Take 1 tablet (5 mg total) by mouth every evening.  Dispense: 90 tablet; Refill: 1  7. 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  Dispense: 90 capsule; Refill: 1 - Ambulatory referral to Physical Therapy - methocarbamol (ROBAXIN) 500 MG tablet; Take 1 tablet (500 mg total) by mouth 2 (two) times daily as needed for muscle spasms.  Dispense: 60 tablet; Refill: 1   Meds ordered this encounter  Medications  . carvedilol (COREG) 6.25 MG tablet    Sig: Take 1 tablet (6.25 mg total) by mouth 2 (two) times daily with a meal.    Dispense:  180 tablet    Refill:  1    Discontinue 3.125 mg  . isosorbide mononitrate (IMDUR) 60 MG 24 hr tablet    Sig: Take 1 tablet (60 mg total) by mouth daily.    Dispense:  90 tablet    Refill:  1  . verapamil (CALAN) 40 MG tablet    Sig: Take 1 tablet (40 mg total) by mouth 2 (two) times daily.    Dispense:  90 tablet    Refill:  1    Discontinue 31m  . glipiZIDE (GLUCOTROL) 10 MG tablet    Sig: Take 1 tablet (10 mg  total) by mouth 2 (two) times daily before a meal.    Dispense:  180 tablet    Refill:  1  . cetirizine (ZYRTEC) 10 MG tablet    Sig: Take 1 tablet (10 mg total) by mouth daily.    Dispense:  30 tablet    Refill:  1  . albuterol (VENTOLIN HFA) 108 (90 Base) MCG/ACT inhaler    Sig: Inhale 2 puffs into the lungs every 6 (six) hours as needed for wheezing or shortness of breath.    Dispense:  18 g    Refill:  3  . azelastine (ASTELIN) 0.1 % nasal spray    Sig: Place 2 sprays into both nostrils 2 (two) times daily.    Dispense:  30 mL    Refill:  5  . gabapentin (NEURONTIN) 300 MG capsule    Sig: Take 1 capsule (300 mg total) by mouth at bedtime.    Dispense:  90 capsule    Refill:  1  . pantoprazole (PROTONIX) 40 MG tablet    Sig: Take 1 tablet (40 mg total) by mouth daily.    Dispense:  90 tablet    Refill:  1  . rosuvastatin (CRESTOR) 5 MG tablet    Sig: Take 1 tablet (5 mg total) by mouth every evening.    Dispense:  90 tablet    Refill:  1  . methocarbamol (ROBAXIN) 500 MG tablet    Sig: Take 1 tablet (500 mg total) by mouth 2 (two) times daily as needed for muscle spasms.    Dispense:  60 tablet    Refill:  1    Follow-up: Return in about 3 months (around 05/06/2019) for Medical conditions.       ECharlott Rakes MD, FAAFP. [facility]  02/03/2019, 12:03 PM

## 2019-02-03 NOTE — Patient Instructions (Signed)
Sciatica    Sciatica is pain, numbness, weakness, or tingling along the path of the sciatic nerve. The sciatic nerve starts in the lower back and runs down the back of each leg. The nerve controls the muscles in the lower leg and in the back of the knee. It also provides feeling (sensation) to the back of the thigh, the lower leg, and the sole of the foot. Sciatica is a symptom of another medical condition that pinches or puts pressure on the sciatic nerve.  Generally, sciatica only affects one side of the body. Sciatica usually goes away on its own or with treatment. In some cases, sciatica may keep coming back (recur).  What are the causes?  This condition is caused by pressure on the sciatic nerve, or pinching of the sciatic nerve. This may be the result of:  · A disk in between the bones of the spine (vertebrae) bulging out too far (herniated disk).  · Age-related changes in the spinal disks (degenerative disk disease).  · A pain disorder that affects a muscle in the buttock (piriformis syndrome).  · Extra bone growth (bone spur) near the sciatic nerve.  · An injury or break (fracture) of the pelvis.  · Pregnancy.  · Tumor (rare).  What increases the risk?  The following factors may make you more likely to develop this condition:  · Playing sports that place pressure or stress on the spine, such as football or weight lifting.  · Having poor strength and flexibility.  · A history of back injury.  · A history of back surgery.  · Sitting for long periods of time.  · Doing activities that involve repetitive bending or lifting.  · Obesity.  What are the signs or symptoms?  Symptoms can vary from mild to very severe, and they may include:  · Any of these problems in the lower back, leg, hip, or buttock:  ? Mild tingling or dull aches.  ? Burning sensations.  ? Sharp pains.  · Numbness in the back of the calf or the sole of the foot.  · Leg weakness.  · Severe back pain that makes movement difficult.  These symptoms  may get worse when you cough, sneeze, or laugh, or when you sit or stand for long periods of time. Being overweight may also make symptoms worse. In some cases, symptoms may recur over time.  How is this diagnosed?  This condition may be diagnosed based on:  · Your symptoms.  · A physical exam. Your health care provider may ask you to do certain movements to check whether those movements trigger your symptoms.  · You may have tests, including:  ? Blood tests.  ? X-rays.  ? MRI.  ? CT scan.  How is this treated?  In many cases, this condition improves on its own, without any treatment. However, treatment may include:  · Reducing or modifying physical activity during periods of pain.  · Exercising and stretching to strengthen your abdomen and improve the flexibility of your spine.  · Icing and applying heat to the affected area.  · Medicines that help:  ? To relieve pain and swelling.  ? To relax your muscles.  · Injections of medicines that help to relieve pain, irritation, and inflammation around the sciatic nerve (steroids).  · Surgery.  Follow these instructions at home:  Medicines  · Take over-the-counter and prescription medicines only as told by your health care provider.  · Do not drive or operate heavy   machinery while taking prescription pain medicine.  Managing pain  · If directed, apply ice to the affected area.  ? Put ice in a plastic bag.  ? Place a towel between your skin and the bag.  ? Leave the ice on for 20 minutes, 2-3 times a day.  · After icing, apply heat to the affected area before you exercise or as often as told by your health care provider. Use the heat source that your health care provider recommends, such as a moist heat pack or a heating pad.  ? Place a towel between your skin and the heat source.  ? Leave the heat on for 20-30 minutes.  ? Remove the heat if your skin turns bright red. This is especially important if you are unable to feel pain, heat, or cold. You may have a greater risk  of getting burned.  Activity  · Return to your normal activities as told by your health care provider. Ask your health care provider what activities are safe for you.  ? Avoid activities that make your symptoms worse.  · Take brief periods of rest throughout the day. Resting in a lying or standing position is usually better than sitting to rest.  ? When you rest for longer periods, mix in some mild activity or stretching between periods of rest. This will help to prevent stiffness and pain.  ? Avoid sitting for long periods of time without moving. Get up and move around at least one time each hour.  · Exercise and stretch regularly, as told by your health care provider.  · Do not lift anything that is heavier than 10 lb (4.5 kg) while you have symptoms of sciatica. When you do not have symptoms, you should still avoid heavy lifting, especially repetitive heavy lifting.  · When you lift objects, always use proper lifting technique, which includes:  ? Bending your knees.  ? Keeping the load close to your body.  ? Avoiding twisting.  General instructions  · Use good posture.  ? Avoid leaning forward while sitting.  ? Avoid hunching over while standing.  · Maintain a healthy weight. Excess weight puts extra stress on your back and makes it difficult to maintain good posture.  · Wear supportive, comfortable shoes. Avoid wearing high heels.  · Avoid sleeping on a mattress that is too soft or too hard. A mattress that is firm enough to support your back when you sleep may help to reduce your pain.  · Keep all follow-up visits as told by your health care provider. This is important.  Contact a health care provider if:  · You have pain that wakes you up when you are sleeping.  · You have pain that gets worse when you lie down.  · Your pain is worse than you have experienced in the past.  · Your pain lasts longer than 4 weeks.  · You experience unexplained weight loss.  Get help right away if:  · You lose control of your  bowel or bladder (incontinence).  · You have:  ? Weakness in your lower back, pelvis, buttocks, or legs that gets worse.  ? Redness or swelling of your back.  ? A burning sensation when you urinate.  This information is not intended to replace advice given to you by your health care provider. Make sure you discuss any questions you have with your health care provider.  Document Released: 07/22/2001 Document Revised: 01/01/2016 Document Reviewed: 04/06/2015  Elsevier Interactive Patient Education ©   2019 [employer]

## 2019-02-03 NOTE — Telephone Encounter (Signed)
New Message   PT states she forgot to ask about her Diabetes patch and would like to pick it up when ready. Please f/u

## 2019-02-04 ENCOUNTER — Other Ambulatory Visit: Payer: Self-pay | Admitting: Family Medicine

## 2019-02-04 ENCOUNTER — Telehealth: Payer: Self-pay | Admitting: Family Medicine

## 2019-02-04 DIAGNOSIS — E871 Hypo-osmolality and hyponatremia: Secondary | ICD-10-CM

## 2019-02-04 LAB — CMP14+EGFR
ALT: 13 IU/L (ref 0–32)
AST: 19 IU/L (ref 0–40)
Albumin/Globulin Ratio: 1.4 (ref 1.2–2.2)
Albumin: 3.9 g/dL (ref 3.7–4.7)
Alkaline Phosphatase: 76 IU/L (ref 39–117)
BUN/Creatinine Ratio: 11 — ABNORMAL LOW (ref 12–28)
BUN: 21 mg/dL (ref 8–27)
Bilirubin Total: 0.3 mg/dL (ref 0.0–1.2)
CO2: 25 mmol/L (ref 20–29)
Calcium: 9.3 mg/dL (ref 8.7–10.3)
Chloride: 90 mmol/L — ABNORMAL LOW (ref 96–106)
Creatinine, Ser: 1.86 mg/dL — ABNORMAL HIGH (ref 0.57–1.00)
GFR calc Af Amer: 31 mL/min/{1.73_m2} — ABNORMAL LOW (ref >59)
GFR calc non Af Amer: 27 mL/min/{1.73_m2} — ABNORMAL LOW (ref >59)
Globulin, Total: 2.8 g/dL (ref 1.5–4.5)
Glucose: 114 mg/dL — ABNORMAL HIGH (ref 65–99)
Potassium: 4.3 mmol/L (ref 3.5–5.2)
Sodium: 128 mmol/L — ABNORMAL LOW (ref 134–144)
Total Protein: 6.7 g/dL (ref 6.0–8.5)

## 2019-02-04 NOTE — Telephone Encounter (Signed)
Patient would like a referral to cardiology due to a low heart. Patient states she informed PCP at [facility].

## 2019-02-04 NOTE — Telephone Encounter (Signed)
Patient was informed of PCP response.

## 2019-02-04 NOTE — Telephone Encounter (Signed)
Her Heart rate has always been normal except for this one time with a slight reduction hence she does not need a Cardiology referral as reduction in Cardizem dose should correct it.

## 2019-02-04 NOTE — Telephone Encounter (Signed)
Patient would like to get the glucose monitor that stays on the body. She does not want to prick her fingers anymore. Patient has [employer].

## 2019-02-04 NOTE — Telephone Encounter (Signed)
Patient called requesting a referral for cardiologist. Stating that her heart rate is low. Patient states that she discussed this with her PCP on her appointment yesterday.  Please advise [phone]  Thank you Paula [name]

## 2019-02-07 NOTE — Telephone Encounter (Signed)
This patient does not qualify for a continuous glucose monitor since she is not insulin dependent.

## 2019-02-07 NOTE — Telephone Encounter (Signed)
Ok. Noted 

## 2019-02-08 ENCOUNTER — Other Ambulatory Visit: Payer: Self-pay | Admitting: Family Medicine

## 2019-02-08 DIAGNOSIS — Z1231 Encounter for screening mammogram for malignant neoplasm of breast: Secondary | ICD-10-CM

## 2019-02-14 MED FILL — CONTOUR NEXT STRIPS: 30 days supply | Qty: 100 | Fill #2

## 2019-02-15 ENCOUNTER — Other Ambulatory Visit: Payer: Self-pay

## 2019-02-15 ENCOUNTER — Ambulatory Visit: Payer: BLUE CROSS/BLUE SHIELD | Attending: Family Medicine | Admitting: Physical Therapy

## 2019-02-15 ENCOUNTER — Encounter: Payer: Self-pay | Admitting: Physical Therapy

## 2019-02-15 DIAGNOSIS — G8929 Other chronic pain: Secondary | ICD-10-CM

## 2019-02-15 DIAGNOSIS — M5442 Lumbago with sciatica, left side: Secondary | ICD-10-CM | POA: Diagnosis present

## 2019-02-15 DIAGNOSIS — M25562 Pain in left knee: Secondary | ICD-10-CM | POA: Insufficient documentation

## 2019-02-15 DIAGNOSIS — R2689 Other abnormalities of gait and mobility: Secondary | ICD-10-CM

## 2019-02-15 NOTE — Therapy (Signed)
[place], [place], [address] Phone: [phone]   Fax:  [phone]  Physical Therapy Evaluation  Patient Details  Name: Paula [name] MRN: [id number] Date of Birth: 1946-01-21 Referring Provider (PT): Dr Charlott Rakes    Encounter Date: 02/15/2019  PT End of Session - 02/15/19 1257    Visit Number  1    Number of Visits  12    Date for PT Re-Evaluation  03/29/19    PT Start Time  1230    PT Stop Time  1314    PT Time Calculation (min)  44 min    Activity Tolerance  Patient tolerated treatment well    Behavior During Therapy  [facility] for tasks assessed/performed       Past Medical History:  Diagnosis Date  . Allergic rhinitis   . Arthritis   . Asthma   . Brain aneurysm   . Chronic kidney disease    Stage 3  . Cough   . Diabetes mellitus   . Diastolic CHF, chronic ([place]) 10/11/2011  . GERD (gastroesophageal reflux disease)   . History of colon polyps 2012   tubular adenoma   . Hyperlipidemia   . Hypertension   . Neuropathy 10/11/2011  . PONV (postoperative nausea and vomiting)    one time after lymph node surgery    Past Surgical History:  Procedure Laterality Date  . CATARACT EXTRACTION     right eye  . IR 3D INDEPENDENT WKST  12/14/2017  . IR ANGIO INTRA EXTRACRAN SEL COM CAROTID INNOMINATE BILAT MOD SED  09/15/2017  . IR ANGIO INTRA EXTRACRAN SEL COM CAROTID INNOMINATE UNI L MOD SED  12/14/2017  . IR ANGIO VERTEBRAL SEL VERTEBRAL BILAT MOD SED  09/15/2017  . IR RADIOLOGIST EVAL & MGMT  09/10/2017  . IR RADIOLOGIST EVAL & MGMT  10/19/2017  . lymphatic mass surgery    . lymphoma mass surgery Bilateral    non cancerous  . NASAL TURBINATE REDUCTION    . RADIOLOGY WITH ANESTHESIA N/A 12/14/2017   Procedure: RADIOLOGY WITH ANESTHESIA EMBOLIZATION;  Surgeon: Luanne Bras, MD;  Location: [facility];  Service: Radiology;  Laterality: N/A;  . VIDEO BRONCHOSCOPY Bilateral 11/18/2017   Procedure: VIDEO  BRONCHOSCOPY WITHOUT FLUORO;  Surgeon: Collene Gobble, MD;  Location[facility];  Service: Cardiopulmonary;  Laterality: Bilateral;    There were no vitals filed for this visit.   Subjective Assessment - 02/15/19 1238    Subjective  Patient had an initial onset of lower back pain 3 years ago. Over the past month the patient has had an increase in lower back pain. The pain radiates down the back of her hip.    Limitations  Standing;Walking    How long can you sit comfortably?  < 5 minutes    How long can you stand comfortably?  knee crepitus with any standing    How long can you walk comfortably?  feels like she has to hold her back sometimes to be able to walk    Diagnostic tests  X-rays: 2019: lumbar mild degenerative changes Right knee: normal x-ray. None performed on the left k nee    Currently in Pain?  Yes    Pain Score  8     Pain Location  Back    Pain Orientation  Left    Pain Descriptors / Indicators  Aching    Pain Type  Chronic pain    Pain Onset  More than a  month ago    Pain Frequency  Intermittent    Aggravating Factors   Sitting causes increased radiular pain    Pain Relieving Factors  moving    Effect of Pain on Daily Activities  difficulty sitting for too long         [facility] PT Assessment - 02/15/19 0001      Assessment   Medical Diagnosis  Low back and knee pain     Referring Provider (PT)  Dr Charlott Rakes     Onset Date/Surgical Date  --   1 month    Hand Dominance  Right    Next MD Visit  Nothing per patient     Prior Therapy  4 years ago       Precautions   Precautions  None      Restrictions   Weight Bearing Restrictions  No      Balance Screen   Has the patient fallen in the past 6 months  No    Has the patient had a decrease in activity level because of a fear of falling?   No    Is the patient reluctant to leave their home because of a fear of falling?   No      Home Film/video editor residence    Additional  Comments  nothing significant       Prior Function   Level of Independence  Independent    Vocation  Retired    Leisure  used to walk 10 blocks a day       Cognition   Overall Cognitive Status  Within Functional Limits for tasks assessed    Attention  Focused    Focused Attention  Appears intact    Memory  Appears intact    Awareness  Appears intact    Problem Solving  Appears intact      Observation/Other Assessments   Observations  frequently shiftis in her chair     Focus on Therapeutic Outcomes (FOTO)   71% limit 51% expected       Sensation   Light Touch  Appears Intact    Additional Comments  reports radiating pain into the left lower leg       Coordination   Gross Motor Movements are Fluid and Coordinated  Yes    Fine Motor Movements are Fluid and Coordinated  Yes      Posture/Postural Control   Posture Comments  rounded shoulders, forward head, flexed trunk in standing       ROM / Strength   AROM / PROM / Strength  AROM;PROM;Strength      AROM   AROM Assessment Site  Lumbar    Lumbar Flexion  limited 75%. lost balance testing and therapy ahd to correct     Lumbar Extension  limited 75%     Lumbar - Right Rotation  limited 50%     Lumbar - Left Rotation  limited 75% with pain       PROM   Overall PROM Comments  full passive ROM of the hip       Strength   Strength Assessment Site  Hip;Knee    Right/Left Hip  Right;Left    Right Hip Flexion  4+/5    Right Hip ABduction  4+/5    Right Hip ADduction  4+/5    Left Hip Flexion  3/5    Left Hip ABduction  3+/5    Left Hip ADduction  3+/5  Right/Left Knee  Left    Left Knee Flexion  4+/5    Left Knee Extension  4/5      Palpation   Palpation comment  signifiacnt spasming of the lumbar paraspinals extending into the left gluteal       Special Tests   Other special tests  patellar compression test (+)       Transfers   Comments  Slow sit to stand. Has to use hands and has difficulty getting left leg  undereath her                 Objective measurements completed on examination: See above findings.              PT Education - 02/15/19 2022    Education Details  HEP, symptom mangement    Person(s) Educated  Patient    Methods  Explanation;Demonstration;Tactile cues;Verbal cues    Comprehension  Tactile cues required;Verbalized understanding       PT Short Term Goals - 02/15/19 1421      PT SHORT TERM GOAL #1   Title  Patient will increase lumbar flexion by 25%    Time  3    Period  Weeks    Status  New    Target Date  03/08/19      PT SHORT TERM GOAL #2   Title  Patient will increase left hip flexion and abduction to 4/5    Time  3    Period  Weeks    Status  New    Target Date  03/08/19      PT SHORT TERM GOAL #3   Title  Patient will be independent with basic exercises to improve left leg strength and decrease spasming    Time  3    Period  Weeks    Status  New    Target Date  03/08/19        PT Long Term Goals - 02/15/19 1422      PT LONG TERM GOAL #1   Title  Patient will stand for 1/2 hour without increased pain    Time  6    Period  Weeks    Status  New    Target Date  03/29/19      PT LONG TERM GOAL #2   Title  Patient will demonstrate 4+/5 gross left LE strength in order to perfrom daily activity    Time  6    Period  Weeks    Status  New    Target Date  03/29/19      PT LONG TERM GOAL #3   Title  Patient will sit for 45 minutes without increased pain in order to perfrom ADL's and IADL's    Time  6    Period  Weeks    Status  New    Target Date  03/08/19             Plan - 02/15/19 1411    Clinical Impression Statement  Patient is a 73 year old female with left sided low back pain and left knee pain. The patient has signifcant spasming in her left lumbar spine and into her gluteal. Shehads limited spinal motion in all planes. Low back motion testing was limited by balance. The patient reports she has not fallen. She  has significant gait deficits. She has lateral movement to the left with ambualtion. She has decreased hip flexion. She has left sided weakness and reports crepitus in  the left knee. She has limited superior/ inferior patellar movement. She would benefit from skilled therapy to improve left leg strength, gait, and reduce spasming. She was seen for a moderate complexity eval    Personal Factors and Comorbidities  Comorbidity 1;Age    Comorbidities  left knee pain    Examination-Activity Limitations  Bend;Dressing;Carry;Locomotion Level    Examination-Participation Restrictions  Shop;Laundry;Community Activity    Stability/Clinical Decision Making  Evolving/Moderate complexity    Clinical Decision Making  Moderate    Rehab Potential  Good    PT Frequency  2x / week    PT Duration  8 weeks    PT Treatment/Interventions  ADLs/Self Care Home Management;Cryotherapy;Electrical Stimulation;Moist Heat;Iontophoresis 4mg /ml Dexamethasone;DME Instruction;Therapeutic exercise;Therapeutic activities;Functional mobility training;Neuromuscular re-education;Patient/family education;Gait training;Manual techniques;Passive range of motion    PT Next Visit Plan  soft tissue mobilization to the left hip; patellar mobilizations; add lumbar march with TA, hip abduction with TA hip aduction with TA; consider LAD. modalities PRN    PT Home Exercise Plan  LTR, piriformis stretch, tennis ball release, patellar glides    Consulted and Agree with Plan of Care  Patient       Patient will benefit from skilled therapeutic intervention in order to improve the following deficits and impairments:  Abnormal gait, Decreased endurance, Decreased mobility, Difficulty walking, Increased muscle spasms, Decreased range of motion, Improper body mechanics, Decreased activity tolerance, Decreased safety awareness, Pain, Postural dysfunction, Impaired UE functional use  Visit Diagnosis: 1. Chronic left-sided low back pain with left-sided  sciatica   2. Chronic pain of left knee   3. Other abnormalities of gait and mobility        Problem List Patient Active Problem List   Diagnosis Date Noted  . Upper airway cough syndrome 10/12/2017  . Chronic rhinitis 10/01/2017  . Seasonal allergic conjunctivitis 10/01/2017  . Aneurysm ([place]) 09/24/2017  . Acute metabolic encephalopathy 01/65/5374  . URI (upper respiratory infection) 09/02/2017  . Facial weakness 09/02/2017  . Benign essential HTN 09/02/2017  . Allergic rhinitis 08/20/2017  . Chronic pain 05/07/2017  . Palpitations 07/10/2016  . Insomnia 03/25/2016  . Gout 01/23/2016  . History of cataract 02/27/2015  . Seasonal and perennial allergic rhinitis 02/15/2012  . Cough 11/04/2011  . Diabetic neuropathy ([place]) 10/11/2011  . Chronic diastolic congestive heart failure ([place]) 10/11/2011  . CKD (chronic kidney disease) stage 3, GFR 30-59 ml/min ([facility]) 10/10/2011  . GERD (gastroesophageal reflux disease)   . Type 2 diabetes mellitus without complication, without long-term current use of insulin ([place]) 07/03/2010  . Hyperlipidemia 07/03/2010  . Essential hypertension 07/03/2010    Carney Living 02/15/2019, 8:27 PM  [facility] [address] [place], [place], [address] Phone: [phone]   Fax:  [phone]  Name: Paula [name] MRN: [id number] Date of Birth: Oct 23, 1945

## 2019-02-22 ENCOUNTER — Other Ambulatory Visit: Payer: BLUE CROSS/BLUE SHIELD

## 2019-02-23 ENCOUNTER — Ambulatory Visit: Payer: BLUE CROSS/BLUE SHIELD | Admitting: Physical Therapy

## 2019-02-23 ENCOUNTER — Encounter: Payer: Self-pay | Admitting: Physical Therapy

## 2019-02-23 ENCOUNTER — Other Ambulatory Visit: Payer: Self-pay

## 2019-02-23 DIAGNOSIS — R2689 Other abnormalities of gait and mobility: Secondary | ICD-10-CM

## 2019-02-23 DIAGNOSIS — M25562 Pain in left knee: Secondary | ICD-10-CM

## 2019-02-23 DIAGNOSIS — M5442 Lumbago with sciatica, left side: Secondary | ICD-10-CM | POA: Diagnosis not present

## 2019-02-23 DIAGNOSIS — G8929 Other chronic pain: Secondary | ICD-10-CM

## 2019-02-23 NOTE — Therapy (Signed)
[place], [place], [address] Phone: [phone]   Fax:  [phone]  Physical Therapy Treatment  Patient Details  Name: Paula [name] MRN: [id number] Date of Birth: October 25, 1945 Referring Provider (PT): Dr Charlott Rakes    Encounter Date: 02/23/2019  PT End of Session - 02/23/19 1030    Visit Number  2    PT Start Time  1005   Patient was 2 hours early to her appointment   PT Stop Time  1047    PT Time Calculation (min)  42 min    Activity Tolerance  Patient tolerated treatment well    Behavior During Therapy  [facility] for tasks assessed/performed       Past Medical History:  Diagnosis Date  . Allergic rhinitis   . Arthritis   . Asthma   . Brain aneurysm   . Chronic kidney disease    Stage 3  . Cough   . Diabetes mellitus   . Diastolic CHF, chronic ([place]) 10/11/2011  . GERD (gastroesophageal reflux disease)   . History of colon polyps 2012   tubular adenoma   . Hyperlipidemia   . Hypertension   . Neuropathy 10/11/2011  . PONV (postoperative nausea and vomiting)    one time after lymph node surgery    Past Surgical History:  Procedure Laterality Date  . CATARACT EXTRACTION     right eye  . IR 3D INDEPENDENT WKST  12/14/2017  . IR ANGIO INTRA EXTRACRAN SEL COM CAROTID INNOMINATE BILAT MOD SED  09/15/2017  . IR ANGIO INTRA EXTRACRAN SEL COM CAROTID INNOMINATE UNI L MOD SED  12/14/2017  . IR ANGIO VERTEBRAL SEL VERTEBRAL BILAT MOD SED  09/15/2017  . IR RADIOLOGIST EVAL & MGMT  09/10/2017  . IR RADIOLOGIST EVAL & MGMT  10/19/2017  . lymphatic mass surgery    . lymphoma mass surgery Bilateral    non cancerous  . NASAL TURBINATE REDUCTION    . RADIOLOGY WITH ANESTHESIA N/A 12/14/2017   Procedure: RADIOLOGY WITH ANESTHESIA EMBOLIZATION;  Surgeon: Luanne Bras, MD;  Location: [facility];  Service: Radiology;  Laterality: N/A;  . VIDEO BRONCHOSCOPY Bilateral 11/18/2017   Procedure: VIDEO BRONCHOSCOPY  WITHOUT FLUORO;  Surgeon: Collene Gobble, MD;  [facility];  Service: Cardiopulmonary;  Laterality: Bilateral;    There were no vitals filed for this visit.  Subjective Assessment - 02/23/19 1011    Subjective  Patient reports her back feels a little better. She has been working on her stretches and exercises at home. She reports that hse maay be lying down too much.    How long can you sit comfortably?  < 5 minutes    How long can you stand comfortably?  knee crepitus with any standing    How long can you walk comfortably?  feels like she has to hold her back sometimes to be able to walk    Diagnostic tests  X-rays: 2019: lumbar mild degenerative changes Right knee: normal x-ray. None performed on the left k nee    Currently in Pain?  Yes    Pain Score  5     Pain Location  Back    Pain Orientation  Left    Pain Descriptors / Indicators  Aching    Pain Type  Chronic pain    Pain Onset  More than a month ago    Pain Frequency  Constant    Aggravating Factors   sitting causes increased pain  Pain Relieving Factors  movement    Effect of Pain on Daily Activities  long                       [facility] Adult PT Treatment/Exercise - 02/23/19 0001      Lumbar Exercises: Stretches   Active Hamstring Stretch Limitations  seated 2x20 sechold bilateral     Lower Trunk Rotation Limitations  x10     Piriformis Stretch Limitations  3x20 with left       Lumbar Exercises: Supine   AB Set Limitations  reviewed proper abdominal breathing patient picked up quickly 2x10     Clam Limitations  seated and supine x10 each patient rpeorted more difficulty in sitting. Advised she had to be more are of posture in sitting     Other Supine Lumbar Exercises  ball squeeze with breathing 2x10       Manual Therapy   Manual Therapy  Passive ROM;Manual Traction;Soft tissue mobilization    Soft tissue mobilization  to left lumbar spine. Improved tolerance to light touch     Passive ROM   Hip PROminto flexion IR an ER     Manual Traction  bilateral LE with grade 3 oscillations                PT Short Term Goals - 02/15/19 1421      PT SHORT TERM GOAL #1   Title  Patient will increase lumbar flexion by 25%    Time  3    Period  Weeks    Status  New    Target Date  03/08/19      PT SHORT TERM GOAL #2   Title  Patient will increase left hip flexion and abduction to 4/5    Time  3    Period  Weeks    Status  New    Target Date  03/08/19      PT SHORT TERM GOAL #3   Title  Patient will be independent with basic exercises to improve left leg strength and decrease spasming    Time  3    Period  Weeks    Status  New    Target Date  03/08/19        PT Long Term Goals - 02/15/19 1422      PT LONG TERM GOAL #1   Title  Patient will stand for 1/2 hour without increased pain    Time  6    Period  Weeks    Status  New    Target Date  03/29/19      PT LONG TERM GOAL #2   Title  Patient will demonstrate 4+/5 gross left LE strength in order to perfrom daily activity    Time  6    Period  Weeks    Status  New    Target Date  03/29/19      PT LONG TERM GOAL #3   Title  Patient will sit for 45 minutes without increased pain in order to perfrom ADL's and IADL's    Time  6    Period  Weeks    Status  New    Target Date  03/08/19            Plan - 02/23/19 1030    Clinical Impression Statement  Significant improvement noted in spasming in the left hip and left hip movement. Therapy added in core exercises. The patient did well coordinating her  breathing with her exercises. She got a little stiff lying on her back. She was shown how to do her exercises    Comorbidities  left knee pain    Examination-Activity Limitations  Bend;Dressing;Carry;Locomotion Level    Examination-Participation Restrictions  Shop;Laundry;Community Activity    Stability/Clinical Decision Making  Evolving/Moderate complexity    Rehab Potential  Good    PT Frequency  2x / week     PT Duration  8 weeks    PT Treatment/Interventions  ADLs/Self Care Home Management;Cryotherapy;Electrical Stimulation;Moist Heat;Iontophoresis 4mg /ml Dexamethasone;DME Instruction;Therapeutic exercise;Therapeutic activities;Functional mobility training;Neuromuscular re-education;Patient/family education;Gait training;Manual techniques;Passive range of motion    PT Next Visit Plan  soft tissue mobilization to the left hip; patellar mobilizations; add lumbar march with TA, hip abduction with TA hip aduction with TA; consider LAD. modalities PRN    PT Home Exercise Plan  LTR, piriformis stretch, tennis ball release, patellar glides    Consulted and Agree with Plan of Care  Patient       Patient will benefit from skilled therapeutic intervention in order to improve the following deficits and impairments:  Abnormal gait, Decreased endurance, Decreased mobility, Difficulty walking, Increased muscle spasms, Decreased range of motion, Improper body mechanics, Decreased activity tolerance, Decreased safety awareness, Pain, Postural dysfunction, Impaired UE functional use  Visit Diagnosis: 1. Chronic left-sided low back pain with left-sided sciatica   2. Chronic pain of left knee   3. Other abnormalities of gait and mobility        Problem List Patient Active Problem List   Diagnosis Date Noted  . Upper airway cough syndrome 10/12/2017  . Chronic rhinitis 10/01/2017  . Seasonal allergic conjunctivitis 10/01/2017  . Aneurysm ([place]) 09/24/2017  . Acute metabolic encephalopathy 16/05/9603  . URI (upper respiratory infection) 09/02/2017  . Facial weakness 09/02/2017  . Benign essential HTN 09/02/2017  . Allergic rhinitis 08/20/2017  . Chronic pain 05/07/2017  . Palpitations 07/10/2016  . Insomnia 03/25/2016  . Gout 01/23/2016  . History of cataract 02/27/2015  . Seasonal and perennial allergic rhinitis 02/15/2012  . Cough 11/04/2011  . Diabetic neuropathy ([place]) 10/11/2011  . Chronic  diastolic congestive heart failure ([place]) 10/11/2011  . CKD (chronic kidney disease) stage 3, GFR 30-59 ml/min ([facility]) 10/10/2011  . GERD (gastroesophageal reflux disease)   . Type 2 diabetes mellitus without complication, without long-term current use of insulin ([place]) 07/03/2010  . Hyperlipidemia 07/03/2010  . Essential hypertension 07/03/2010    Carney Living PT DPT  02/23/2019, 8:53 PM  [facility] [address] [place], [place], [address] Phone: [phone]   Fax:  [phone]  Name: MCKYLA DECKMAN MRN: [id number] Date of Birth: 03-31-1946

## 2019-02-25 ENCOUNTER — Other Ambulatory Visit: Payer: BLUE CROSS/BLUE SHIELD

## 2019-02-25 ENCOUNTER — Other Ambulatory Visit: Payer: Self-pay

## 2019-02-25 ENCOUNTER — Ambulatory Visit: Payer: BLUE CROSS/BLUE SHIELD | Attending: Family Medicine

## 2019-02-25 DIAGNOSIS — E871 Hypo-osmolality and hyponatremia: Secondary | ICD-10-CM

## 2019-02-25 MED FILL — ROSUVASTATIN CALCIUM 5 MG T: 5 | 90 days supply | Qty: 90 | Fill #0

## 2019-02-26 LAB — BASIC METABOLIC PANEL
BUN/Creatinine Ratio: 12 (ref 12–28)
BUN: 27 mg/dL (ref 8–27)
CO2: 28 mmol/L (ref 20–29)
Calcium: 9.3 mg/dL (ref 8.7–10.3)
Chloride: 94 mmol/L — ABNORMAL LOW (ref 96–106)
Creatinine, Ser: 2.29 mg/dL — ABNORMAL HIGH (ref 0.57–1.00)
GFR calc Af Amer: 24 mL/min/{1.73_m2} — ABNORMAL LOW (ref >59)
GFR calc non Af Amer: 21 mL/min/{1.73_m2} — ABNORMAL LOW (ref >59)
Glucose: 135 mg/dL — ABNORMAL HIGH (ref 65–99)
Potassium: 4.3 mmol/L (ref 3.5–5.2)
Sodium: 135 mmol/L (ref 134–144)

## 2019-02-28 MED FILL — VERAPAMIL HCL 40 MG TABS: 40 | 60 days supply | Qty: 120 | Fill #1

## 2019-02-28 MED FILL — ISOSORBIDE MN ER 60 MG TAB: 60 | 90 days supply | Qty: 90 | Fill #0

## 2019-02-28 MED FILL — CARVEDILOL 6.25 MG TABLET: 6.25 | 90 days supply | Qty: 180 | Fill #1

## 2019-03-02 ENCOUNTER — Ambulatory Visit: Payer: BLUE CROSS/BLUE SHIELD | Admitting: Physical Therapy

## 2019-03-03 ENCOUNTER — Telehealth: Payer: Self-pay | Admitting: Family Medicine

## 2019-03-03 NOTE — Telephone Encounter (Signed)
Patient wants to talk to a nurse or Dr regarding her medications because [facility] told her not to take Gabapentin , acid reflux and cholesterol they said that  medicine she is not suppose to take it because she is losing her kidney . Please, call her back   Thanks

## 2019-03-03 NOTE — Telephone Encounter (Signed)
Patient states that her kidney doctor told her to stop Gabapentin due to messing up kidneys.

## 2019-03-04 NOTE — Telephone Encounter (Signed)
Pt called back because it urges her for someone to get back to her about her kidneys pt states that gabapentin and cetirizine are not good for her kidneys, please follow up.

## 2019-03-04 NOTE — Telephone Encounter (Signed)
Pt would like to know what medication she can take in order to replace cetirizine..please advice.

## 2019-03-04 NOTE — Telephone Encounter (Signed)
Patient wants to know what she can take in place of cetirizine.

## 2019-03-04 NOTE — Telephone Encounter (Signed)
She can discontinue her gabapentin if advised by her nephrologist to do so.  Cetirizine is for her allergic rhinitis and cough and if she feels it is not good for her kidneys and would like to discontinue it she can feel free to do so.

## 2019-03-07 NOTE — Telephone Encounter (Signed)
She can take Claritin

## 2019-03-08 ENCOUNTER — Other Ambulatory Visit: Payer: Self-pay | Admitting: Nephrology

## 2019-03-08 DIAGNOSIS — N183 Chronic kidney disease, stage 3 unspecified: Secondary | ICD-10-CM

## 2019-03-09 ENCOUNTER — Ambulatory Visit: Payer: BLUE CROSS/BLUE SHIELD | Admitting: Physical Therapy

## 2019-03-10 ENCOUNTER — Other Ambulatory Visit (HOSPITAL_COMMUNITY): Payer: Self-pay | Admitting: Interventional Radiology

## 2019-03-10 ENCOUNTER — Ambulatory Visit: Payer: BLUE CROSS/BLUE SHIELD

## 2019-03-10 DIAGNOSIS — I729 Aneurysm of unspecified site: Secondary | ICD-10-CM

## 2019-03-16 ENCOUNTER — Ambulatory Visit (HOSPITAL_COMMUNITY): Admission: RE | Admit: 2019-03-16 | Payer: BLUE CROSS/BLUE SHIELD | Source: Ambulatory Visit

## 2019-03-17 MED FILL — CONTOUR NEXT STRIPS: 30 days supply | Qty: 100 | Fill #3

## 2019-03-21 ENCOUNTER — Telehealth: Payer: Self-pay | Admitting: Emergency Medicine

## 2019-03-21 ENCOUNTER — Encounter: Payer: Self-pay | Admitting: Primary Care

## 2019-03-21 ENCOUNTER — Telehealth: Payer: Self-pay

## 2019-03-21 ENCOUNTER — Other Ambulatory Visit: Payer: Self-pay

## 2019-03-21 ENCOUNTER — Ambulatory Visit (INDEPENDENT_AMBULATORY_CARE_PROVIDER_SITE_OTHER): Payer: BLUE CROSS/BLUE SHIELD | Admitting: Primary Care

## 2019-03-21 DIAGNOSIS — R053 Chronic cough: Secondary | ICD-10-CM

## 2019-03-21 DIAGNOSIS — R05 Cough: Secondary | ICD-10-CM

## 2019-03-21 MED ORDER — AZELASTINE HCL 0.1 % NA SOLN: 2.0000 | Freq: Two times a day (BID) | NASAL | 5 refills | Status: DC

## 2019-03-21 MED ORDER — SALINE SPRAY 0.65 % NA SOLN: 2.0000 | NASAL | 3 refills | Status: DC | PRN

## 2019-03-21 NOTE — Telephone Encounter (Signed)
Pt returned call from earlier today not sure what it was about. Please call today before other pulmonary appt or after.

## 2019-03-21 NOTE — Progress Notes (Signed)
Virtual Visit via Telephone Note  I connected with Paula [name] on 03/21/19 at  4:00 PM EDT by telephone and verified that I am speaking with the correct person using two identifiers.  Location: Patient: Home Provider: Office   I discussed the limitations, risks, security and privacy concerns of performing an evaluation and management service by telephone and the availability of in person appointments. I also discussed with the patient that there may be a patient responsible charge related to this service. The patient expressed understanding and agreed to proceed.   History of Present Illness: 73 year old, never smoked. PMH significant for chronic rhinitis, URI, HTN, chronic diastolic CHF, type 2 diabetes, GERD. Patient of Dr. Lamonte Sakai, last seen on 06/2018. Normal PFTs. Normal bronchoscopy but did show some throat irritation. Ruled out significant pulmonary pathology. CT sinuses on 07/22/18 showed no evidence of sinusitis.    03/21/2019 Patient contacted today for televisit. She continues to have chronic cough, appears more upper airway. Cough is mostly dry, occasionally productive with clear mucus. Associated nasal drainage. States that she has been to voice rehab, reports to be that her larynx are swollen. She has stopped taking most of her medications d/t her declining kidney function. Denies shortness of breath or wheezing.   Observations/Objective:  - Difficult to understand over phone call d/t accent  - Audible upper airway congestion   Assessment and Plan:  Chronic cough - Likely d/t rhinitis and post nasal drip - She ruled out for any significant pulmonary pathology per Dr. Lamonte Sakai  - Advised patient use ocean nasal sinus twice daily followed by azelastine 1 spray per nostril twice daily  - Continue Singulair and Zyrtec if ok per kidney doctor  - Refer ENT   Follow Up Instructions:   As needed if symptoms worsen   I discussed the assessment and treatment plan with the  patient. The patient was provided an opportunity to ask questions and all were answered. The patient agreed with the plan and demonstrated an understanding of the instructions.   The patient was advised to call back or seek an in-person evaluation if the symptoms worsen or if the condition fails to improve as anticipated.  I provided 15 minutes of non-face-to-face time during this encounter.   Martyn Ehrich, NP

## 2019-03-21 NOTE — Telephone Encounter (Signed)
Primary Pulmonologist: RB Last office visit and with whom: 06/23/18 with RB What do we see them for (pulmonary problems): cough Last OV assessment/plan: Instructions  Your pulmonary function testing and a chest imaging have both been reassuring. Your bronchoscopy has been reassuring as well but does show some throat irritation. You would benefit from continuing your medications for allergic nasal drainage including Zyrtec, Flonase nasal spray.  He probably also benefit from restarting your Singulair You may benefit from being on a stomach acid medication, previously you were on Protonix. Agree with ENT evaluations that are recommended cough behavioral therapy to help reduce your cough and keep it from being so intrusive in your life. Follow with Dr Lamonte Sakai if needed     Was appointment offered to patient (explain)?  Pt wants recommendations   Reason for call: called and spoke with pt who stated she has been coughing and states her cough has been worse x2 weeks. Pt is coughing up clear mucus. Pt denies any complaints of head congestion. Pt said that she coughs due to postnasal drainage.  Pt said that she went to [facility] and was told that her larynx was ruined.  Asked pt if she had taken any meds to see if it would help with her cough and pt said that a lot of her meds had been stopped due to her kidneys. Pt said that she is not currently taking benzonatate. Asked pt if she has had to use her ventolin inhaler and she said that she has used it once today.   Stated to pt that we should get her scheduled for a televisit to further evaluate and pt verbalized understanding. Pt has been scheduled a televisit with Beth today at 4pm. Nothing further needed.

## 2019-03-21 NOTE — Patient Instructions (Addendum)
Use ocean nasal sinus twice daily Followed by azelastine 1 spray per nostril twice daily  Continue Singulair and Zyrtec if ok per kidney doctor   Referral: ENT ([place]- local)

## 2019-03-21 NOTE — Telephone Encounter (Signed)
Will forward to Alycia  

## 2019-03-22 ENCOUNTER — Encounter: Payer: Self-pay | Admitting: Primary Care

## 2019-03-22 NOTE — Addendum Note (Signed)
Addended by: Parke Poisson E on: 03/22/2019 02:30 PM   Modules accepted: Orders

## 2019-03-23 ENCOUNTER — Ambulatory Visit (HOSPITAL_COMMUNITY): Payer: BLUE CROSS/BLUE SHIELD

## 2019-03-24 ENCOUNTER — Other Ambulatory Visit: Payer: Self-pay

## 2019-03-24 ENCOUNTER — Ambulatory Visit (HOSPITAL_COMMUNITY)
Admission: RE | Admit: 2019-03-24 | Discharge: 2019-03-24 | Disposition: A | Discharge status: Home / Self Care | Payer: BLUE CROSS/BLUE SHIELD | Source: Ambulatory Visit | Attending: Interventional Radiology | Admitting: Interventional Radiology

## 2019-03-24 DIAGNOSIS — I729 Aneurysm of unspecified site: Secondary | ICD-10-CM | POA: Insufficient documentation

## 2019-03-26 ENCOUNTER — Emergency Department (HOSPITAL_COMMUNITY)
Admission: EM | Admit: 2019-03-26 | Discharge: 2019-03-26 | Disposition: A | Discharge status: Home / Self Care | Payer: BLUE CROSS/BLUE SHIELD | Attending: Emergency Medicine | Admitting: Emergency Medicine

## 2019-03-26 ENCOUNTER — Encounter (HOSPITAL_COMMUNITY): Payer: Self-pay

## 2019-03-26 ENCOUNTER — Other Ambulatory Visit: Payer: Self-pay

## 2019-03-26 ENCOUNTER — Emergency Department (HOSPITAL_COMMUNITY): Payer: BLUE CROSS/BLUE SHIELD

## 2019-03-26 DIAGNOSIS — R51 Headache: Secondary | ICD-10-CM | POA: Diagnosis present

## 2019-03-26 DIAGNOSIS — I5032 Chronic diastolic (congestive) heart failure: Secondary | ICD-10-CM | POA: Insufficient documentation

## 2019-03-26 DIAGNOSIS — Z7982 Long term (current) use of aspirin: Secondary | ICD-10-CM | POA: Insufficient documentation

## 2019-03-26 DIAGNOSIS — N183 Chronic kidney disease, stage 3 (moderate): Secondary | ICD-10-CM | POA: Diagnosis not present

## 2019-03-26 DIAGNOSIS — I10 Essential (primary) hypertension: Secondary | ICD-10-CM

## 2019-03-26 DIAGNOSIS — E1122 Type 2 diabetes mellitus with diabetic chronic kidney disease: Secondary | ICD-10-CM | POA: Diagnosis not present

## 2019-03-26 DIAGNOSIS — J45909 Unspecified asthma, uncomplicated: Secondary | ICD-10-CM | POA: Insufficient documentation

## 2019-03-26 DIAGNOSIS — R519 Headache, unspecified: Secondary | ICD-10-CM

## 2019-03-26 DIAGNOSIS — I671 Cerebral aneurysm, nonruptured: Secondary | ICD-10-CM | POA: Diagnosis not present

## 2019-03-26 DIAGNOSIS — I130 Hypertensive heart and chronic kidney disease with heart failure and stage 1 through stage 4 chronic kidney disease, or unspecified chronic kidney disease: Secondary | ICD-10-CM | POA: Insufficient documentation

## 2019-03-26 DIAGNOSIS — Z79899 Other long term (current) drug therapy: Secondary | ICD-10-CM | POA: Insufficient documentation

## 2019-03-26 DIAGNOSIS — Z7984 Long term (current) use of oral hypoglycemic drugs: Secondary | ICD-10-CM | POA: Diagnosis not present

## 2019-03-26 LAB — COMPREHENSIVE METABOLIC PANEL
ALT: 15 U/L (ref 0–44)
AST: 20 U/L (ref 15–41)
Albumin: 3.2 g/dL — ABNORMAL LOW (ref 3.5–5.0)
Alkaline Phosphatase: 77 U/L (ref 38–126)
Anion gap: 12 (ref 5–15)
BUN: 22 mg/dL (ref 8–23)
CO2: 24 mmol/L (ref 22–32)
Calcium: 8.8 mg/dL — ABNORMAL LOW (ref 8.9–10.3)
Chloride: 99 mmol/L (ref 98–111)
Creatinine, Ser: 1.73 mg/dL — ABNORMAL HIGH (ref 0.44–1.00)
GFR calc Af Amer: 34 mL/min — ABNORMAL LOW (ref >60)
GFR calc non Af Amer: 29 mL/min — ABNORMAL LOW (ref >60)
Glucose, Bld: 200 mg/dL — ABNORMAL HIGH (ref 70–99)
Potassium: 3.5 mmol/L (ref 3.5–5.1)
Sodium: 135 mmol/L (ref 135–145)
Total Bilirubin: 0.5 mg/dL (ref 0.3–1.2)
Total Protein: 6.3 g/dL — ABNORMAL LOW (ref 6.5–8.1)

## 2019-03-26 LAB — PROTIME-INR
INR: 1.1 (ref 0.8–1.2)
Prothrombin Time: 13.7 seconds (ref 11.4–15.2)

## 2019-03-26 LAB — CBC WITH DIFFERENTIAL/PLATELET
Abs Immature Granulocytes: 0.01 10*3/uL (ref 0.00–0.07)
Basophils Absolute: 0 10*3/uL (ref 0.0–0.1)
Basophils Relative: 0 %
Eosinophils Absolute: 0.1 10*3/uL (ref 0.0–0.5)
Eosinophils Relative: 2 %
HCT: 34.7 % — ABNORMAL LOW (ref 36.0–46.0)
Hemoglobin: 11.8 g/dL — ABNORMAL LOW (ref 12.0–15.0)
Immature Granulocytes: 0 %
Lymphocytes Relative: 17 %
Lymphs Abs: 0.8 10*3/uL (ref 0.7–4.0)
MCH: 28.2 pg (ref 26.0–34.0)
MCHC: 34 g/dL (ref 30.0–36.0)
MCV: 82.8 fL (ref 80.0–100.0)
Monocytes Absolute: 0.4 10*3/uL (ref 0.1–1.0)
Monocytes Relative: 9 %
Neutro Abs: 3.6 10*3/uL (ref 1.7–7.7)
Neutrophils Relative %: 72 %
Platelets: 186 10*3/uL (ref 150–400)
RBC: 4.19 MIL/uL (ref 3.87–5.11)
RDW: 12.8 % (ref 11.5–15.5)
WBC: 5 10*3/uL (ref 4.0–10.5)
nRBC: 0 % (ref 0.0–0.2)

## 2019-03-26 MED ORDER — ONDANSETRON HCL 4 MG/2ML IJ SOLN
4.0000 mg | Freq: Once | INTRAMUSCULAR | Status: AC
  Administered 2019-03-26: 17:00:00 4 mg via INTRAVENOUS
  Filled 2019-03-26: qty 2

## 2019-03-26 MED ORDER — HYDRALAZINE HCL 20 MG/ML IJ SOLN
10.0000 mg | Freq: Once | INTRAMUSCULAR | Status: AC
  Administered 2019-03-26: 17:00:00 10 mg via INTRAVENOUS
  Filled 2019-03-26: qty 1

## 2019-03-26 MED ORDER — ACETAMINOPHEN 325 MG PO TABS
650.0000 mg | ORAL_TABLET | Freq: Once | ORAL | Status: AC
  Administered 2019-03-26: 17:00:00 650 mg via ORAL
  Filled 2019-03-26: qty 2

## 2019-03-26 MED ORDER — IOHEXOL 350 MG/ML SOLN
50.0000 mL | Freq: Once | INTRAVENOUS | Status: AC | PRN
  Administered 2019-03-26: 50 mL via INTRAVENOUS

## 2019-03-26 NOTE — ED Notes (Signed)
Patient transported to CT 

## 2019-03-26 NOTE — ED Notes (Signed)
Pt walked well and independently to bathroom

## 2019-03-26 NOTE — ED Triage Notes (Addendum)
Pt from home c/o htn and HA that began 0930 this am; denies visual disturbances, dizziness; R temple and occipital region, pressure; hx brain aneurysm (pt endorses aneurysm 3 days ago, seen at [facility]); stroke screen negative with ems, no hx stroke; PCP advised to reduce to half does on BP meds 3 weeks ago, but pt has continued to take full doses  198/100 HR 75 RR 20 CBG 211 (hx DM) 99% RA (placed on 2 L for comfort)

## 2019-03-26 NOTE — ED Notes (Signed)
Patient verbalizes understanding of discharge instructions. Opportunity for questioning and answers were provided. Armband removed by staff, pt discharged from ED ambulatory.   

## 2019-03-26 NOTE — ED Provider Notes (Signed)
[facility] EMERGENCY DEPARTMENT Provider Note   CSN: [id number] Arrival date & time: 03/26/19  1519    History   Chief Complaint Chief Complaint  Patient presents with   Hypertension   Headache    HPI Paula [name] is a 73 y.o. female.     HPI  Patient presents for evaluation of headache.  She reports that she had felt anxious last night and unwell like something might be wrong.  Difficult describe what or how though.  She was able to go to sleep and then woke this morning around 930 with a severe left-sided sharp headache.  States that it waxes and wanes in severity but has not gone away completely.  She is taking over-the-counter pain medication and try heating pads without improvement.  No other neurologic deficits endorsed including vision problems.  States that she has never had a headache like this before.  Past Medical History:  Diagnosis Date   Allergic rhinitis    Arthritis    Asthma    Brain aneurysm    Chronic kidney disease    Stage 3   Cough    Diabetes mellitus    Diastolic CHF, chronic ([place]) 10/11/2011   GERD (gastroesophageal reflux disease)    History of colon polyps 2012   tubular adenoma    Hyperlipidemia    Hypertension    Neuropathy 10/11/2011   PONV (postoperative nausea and vomiting)    one time after lymph node surgery    Patient Active Problem List   Diagnosis Date Noted   Upper airway cough syndrome 10/12/2017   Chronic rhinitis 10/01/2017   Seasonal allergic conjunctivitis 10/01/2017   Aneurysm ([place]) 65/79/0383   Acute metabolic encephalopathy 33/83/2919   URI (upper respiratory infection) 09/02/2017   Facial weakness 09/02/2017   Benign essential HTN 09/02/2017   Allergic rhinitis 08/20/2017   Chronic pain 05/07/2017   Palpitations 07/10/2016   Insomnia 03/25/2016   Gout 01/23/2016   History of cataract 02/27/2015   Seasonal and perennial allergic rhinitis 02/15/2012   Cough  11/04/2011   Diabetic neuropathy ([place]) 10/11/2011   Chronic diastolic congestive heart failure ([place]) 10/11/2011   CKD (chronic kidney disease) stage 3, GFR 30-59 ml/min ([facility]) 10/10/2011   GERD (gastroesophageal reflux disease)    Type 2 diabetes mellitus without complication, without long-term current use of insulin ([place]) 07/03/2010   Hyperlipidemia 07/03/2010   Essential hypertension 07/03/2010    Past Surgical History:  Procedure Laterality Date   CATARACT EXTRACTION     right eye   IR 3D INDEPENDENT WKST  12/14/2017   IR ANGIO INTRA EXTRACRAN SEL COM CAROTID INNOMINATE BILAT MOD SED  09/15/2017   IR ANGIO INTRA EXTRACRAN SEL COM CAROTID INNOMINATE UNI L MOD SED  12/14/2017   IR ANGIO VERTEBRAL SEL VERTEBRAL BILAT MOD SED  09/15/2017   IR RADIOLOGIST EVAL & MGMT  09/10/2017   IR RADIOLOGIST EVAL & MGMT  10/19/2017   lymphatic mass surgery     lymphoma mass surgery Bilateral    non cancerous   NASAL TURBINATE REDUCTION     RADIOLOGY WITH ANESTHESIA N/A 12/14/2017   Procedure: RADIOLOGY WITH ANESTHESIA EMBOLIZATION;  Surgeon: Luanne Bras, MD;  Location: [facility];  Service: Radiology;  Laterality: N/A;   VIDEO BRONCHOSCOPY Bilateral 11/18/2017   Procedure: VIDEO BRONCHOSCOPY WITHOUT FLUORO;  Surgeon: Collene Gobble, MD;  Location: [facility];  Service: Cardiopulmonary;  Laterality: Bilateral;     OB History    Gravida  5  Para  4   Term  4   Preterm      AB  1   Living  4     SAB      TAB  1   Ectopic      Multiple      Live Births               Home Medications    Prior to Admission medications   Medication Sig Start Date End Date Taking? Authorizing Provider  acetaminophen (TYLENOL) 500 MG tablet Take 500-1,000 mg by mouth every 6 (six) hours as needed for headache (pain).    Yes [provider]  albuterol (VENTOLIN HFA) 108 (90 Base) MCG/ACT inhaler Inhale 2 puffs into the lungs every 6 (six) hours as needed for wheezing or  shortness of breath. 02/03/19  Yes Charlott Rakes, MD  aspirin EC 81 MG tablet Take 81 mg by mouth every evening.   Yes [provider]  azelastine (ASTELIN) 0.1 % nasal spray Place 2 sprays into both nostrils 2 (two) times daily. 03/21/19  Yes Martyn Ehrich, NP  carvedilol (COREG) 6.25 MG tablet Take 1 tablet (6.25 mg total) by mouth 2 (two) times daily with a meal. 02/03/19  Yes Newlin, Enobong, MD  glipiZIDE (GLUCOTROL) 10 MG tablet Take 1 tablet (10 mg total) by mouth 2 (two) times daily before a meal. 02/03/19  Yes Newlin, Enobong, MD  isosorbide mononitrate (IMDUR) 60 MG 24 hr tablet Take 1 tablet (60 mg total) by mouth daily. 02/03/19  Yes Charlott Rakes, MD  sodium chloride (OCEAN) 0.65 % SOLN nasal spray Place 2 sprays into both nostrils as needed for congestion. 03/21/19  Yes Martyn Ehrich, NP  verapamil (CALAN) 80 MG tablet Take 80 mg by mouth 2 (two) times daily.   Yes [provider]  benzonatate (TESSALON) 100 MG capsule Take 1 capsule (100 mg total) by mouth 3 (three) times daily as needed for cough. Patient not taking: Reported on 09/20/2018 09/07/18   Charlott Rakes, MD  Blood Glucose Monitoring Suppl (CONTOUR NEXT MONITOR) w/Device KIT 1 kit by Does not apply route 3 (three) times daily. 11/29/18   Charlott Rakes, MD  cetirizine (ZYRTEC) 10 MG tablet Take 1 tablet (10 mg total) by mouth daily. Patient not taking: Reported on 03/21/2019 02/03/19   Charlott Rakes, MD  diclofenac sodium (VOLTAREN) 1 % GEL Apply 4 g topically 4 (four) times daily. Patient not taking: Reported on 03/21/2019 06/02/18   Charlott Rakes, MD  fluticasone (FLONASE) 50 MCG/ACT nasal spray Place 2 sprays into both nostrils daily. Patient not taking: Reported on 07/16/2018 12/22/17   Collene Gobble, MD  fluticasone [facility]) 50 MCG/ACT nasal spray Place 2 sprays into both nostrils daily. Patient not taking: Reported on 03/21/2019 09/20/18   Garnet Sierras, DO  gabapentin (NEURONTIN) 300 MG  capsule Take 1 capsule (300 mg total) by mouth at bedtime. Patient not taking: Reported on 03/21/2019 02/03/19   Charlott Rakes, MD  glucose blood test strip USE AS DIRECTED 3 TIMES DAILY. 11/29/18   Charlott Rakes, MD  hydrocortisone 1 % ointment Apply 1 application topically 2 (two) times daily. Patient not taking: Reported on 03/21/2019 10/19/18   Charlott Rakes, MD  lidocaine (LIDODERM) 5 % Place 1 patch onto the skin daily. Remove & Discard patch within 12 hours or as directed by MD Patient not taking: Reported on 03/21/2019 10/19/18   Charlott Rakes, MD  methocarbamol (ROBAXIN) 500 MG tablet Take 1  tablet (500 mg total) by mouth 2 (two) times daily as needed for muscle spasms. Patient not taking: Reported on 03/21/2019 02/03/19   Charlott Rakes, MD  Microlet Lancets MISC Use to check blood sugar up to 3 times daily. 11/29/18   Charlott Rakes, MD  montelukast (SINGULAIR) 10 MG tablet Take 1 tablet (10 mg total) by mouth at bedtime. Patient not taking: Reported on 03/21/2019 10/19/18   Charlott Rakes, MD  pantoprazole (PROTONIX) 40 MG tablet Take 1 tablet (40 mg total) by mouth daily. Patient not taking: Reported on 03/21/2019 02/03/19   Charlott Rakes, MD  rosuvastatin (CRESTOR) 5 MG tablet Take 1 tablet (5 mg total) by mouth every evening. Patient not taking: Reported on 03/26/2019 02/03/19   Charlott Rakes, MD  verapamil (CALAN) 40 MG tablet Take 1 tablet (40 mg total) by mouth 2 (two) times daily. Patient not taking: Reported on 03/26/2019 02/03/19   Charlott Rakes, MD    Family History Family History  Problem Relation Age of Onset   Hypertension Mother    Asthma Sister    Colon cancer Neg Hx    Allergic rhinitis Neg Hx    Eczema Neg Hx     Social History Social History   Tobacco Use   Smoking status: Never Smoker   Smokeless tobacco: Never Used  Substance Use Topics   Alcohol use: No   Drug use: No     Allergies   Dust mite extract, Ace inhibitors, and  Lovastatin   Review of Systems Review of Systems  Neurological: Positive for headaches.  Psychiatric/Behavioral: The patient is nervous/anxious.   All other systems reviewed and are negative.    Physical Exam Updated Vital Signs BP (!) 185/89 (BP Location: Right Arm)    Pulse 78    Temp 98.1 F (36.7 C) (Oral)    Resp 19    Ht _0  (1.575 m)    Wt 68 kg    LMP  (LMP Unknown)    SpO2 100%    BMI 27.44 kg/m   Physical Exam Vitals signs and nursing note reviewed.  Constitutional:      General: She is not in acute distress.    Appearance: She is well-developed.  HENT:     Head: Normocephalic and atraumatic.  Eyes:     Extraocular Movements: Extraocular movements intact.     Conjunctiva/sclera: Conjunctivae normal.     Comments: Left pupil is 3 mm, right pupil is 2, both equally reactive.  Neck:     Musculoskeletal: Neck supple.  Cardiovascular:     Rate and Rhythm: Normal rate and regular rhythm.     Heart sounds: No murmur.  Pulmonary:     Effort: Pulmonary effort is normal. No respiratory distress.     Breath sounds: Normal breath sounds.  Abdominal:     Palpations: Abdomen is soft.     Tenderness: There is no abdominal tenderness.  Skin:    General: Skin is warm and dry.  Neurological:     Mental Status: She is alert.     Comments: No dysarthria, dysmetria, and aphasia.  Normal equal strength and sensation to light touch throughout.  No pronator drift.      ED Treatments / Results  Labs (all labs ordered are listed, but only abnormal results are displayed) Labs Reviewed  COMPREHENSIVE METABOLIC PANEL - Abnormal; Notable for the following components:      Result Value   Glucose, Bld 200 (*)    Creatinine, Ser 1.73 (*)  Calcium 8.8 (*)    Total Protein 6.3 (*)    Albumin 3.2 (*)    GFR calc non Af Amer 29 (*)    GFR calc Af Amer 34 (*)    All other components within normal limits  CBC WITH DIFFERENTIAL/PLATELET - Abnormal; Notable for the following  components:   Hemoglobin 11.8 (*)    HCT 34.7 (*)    All other components within normal limits  PROTIME-INR    EKG None  Radiology Ct Angio Head W Or Wo Contrast  Result Date: 03/26/2019 CLINICAL DATA:  Headache.  History of cerebral aneurysm. EXAM: CT ANGIOGRAPHY HEAD TECHNIQUE: Multidetector CT imaging of the head was performed using the standard protocol during bolus administration of intravenous contrast. Multiplanar CT image reconstructions and MIPs were obtained to evaluate the vascular anatomy. CONTRAST:  99m OMNIPAQUE IOHEXOL 350 MG/ML SOLN COMPARISON:  Head MRA 03/24/2019. Head MRI 08/09/2018. Head CTA 05/13/2018. FINDINGS: CT HEAD Brain: There is no evidence of acute infarct, intracranial hemorrhage, mass, midline shift, or extra-axial fluid collection. Mild cerebral atrophy is not greater than expected for age. Patchy hypodensities in the cerebral white matter bilaterally are nonspecific but compatible with mild-to-moderate chronic small vessel ischemic disease. Vascular: Calcified atherosclerosis at the skull base. No hyperdense vessel. Skull: No fracture or focal osseous lesion. Sinuses: The visualized paranasal sinuses and mastoid air cells are clear. Orbits: Bilateral cataract extraction. CTA HEAD Anterior circulation: The internal carotid arteries are patent from skull base to carotid termini with atherosclerotic plaque resulting in mild bilateral cavernous segment stenosis. A 3 x 3 mm aneurysm projecting medially from the proximal left cavernous ICA is unchanged from 2019 as is a 3 x 4 mm left supraclinoid ICA aneurysm in the posterior communicating region. Ectasia and atherosclerotic irregularity of the left supraclinoid ICA proximal to this aneurysm are also similar to the 2019 CTA. The right A1 segment is hypoplastic. The left A1 segment is widely patent and robust/mildly ectatic, supplying both ACAs. The MCAs are patent without evidence of proximal branch occlusion or significant  proximal stenosis. ACA and MCA branch vessel atherosclerotic irregularity is present bilaterally. Posterior circulation: The visualized distal vertebral arteries are patent to the basilar with mild atherosclerosis bilaterally not resulting in significant stenosis. Patent PICA and SCA origins are identified bilaterally. The basilar artery is patent with atherosclerotic irregularity but no significant stenosis. Posterior communicating arteries are not identified and may be diminutive or absent. The PCAs are patent with diffuse atherosclerotic irregularity but no evidence of flow limiting proximal stenosis. No aneurysm is identified. Venous sinuses: As permitted by contrast timing, patent. Anatomic variants: Hypoplastic right A1. IMPRESSION: 1. 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 2. Intracranial atherosclerosis without large vessel occlusion. Mild bilateral ICA stenosis. 3. No evidence of acute intracranial abnormality. Electronically Signed   By: ALogan BoresM.D.   On: 03/26/2019 19:52    Procedures Procedures (including critical care time)  Medications Ordered in ED Medications  ondansetron (ZOFRAN) injection 4 mg (4 mg Intravenous Given 03/26/19 1644)  acetaminophen (TYLENOL) tablet 650 mg (650 mg Oral Given 03/26/19 1649)  hydrALAZINE (APRESOLINE) injection 10 mg (10 mg Intravenous Given 03/26/19 1651)  iohexol (OMNIPAQUE) 350 MG/ML injection 50 mL (50 mLs Intravenous Contrast Given 03/26/19 1805)     Initial Impression / Assessment and Plan / ED Course  I have reviewed the triage vital signs and the nursing notes.  Pertinent labs & imaging results that were available  during my care of the patient were reviewed by me and considered in my medical decision making (see chart for details).        Ms. Rockholt is a 73 year old female with a history of known cerebral artery aneurysm, CHF, CKD, essential HTN, type 2 diabetes without  insulin use.  She just had repeat MRA 2 days ago which showed redemonstrated/stable 4 mm aneurysm and 2 mm aneurysm.  Given the abrupt onset and severity of her headache which is unique in character at her advanced age, I was concerned on arrival for aneurysmal rupture/subarachnoid hemorrhage.  She is also severely hypertensive with systolics up to 191Y.  Gave Tylenol and hydralazine for headache and hypertension.  Proceeded with CTA.  Patient remained stable.  Blood pressure improved with hydralazine.  Headache improved significantly as well.  CTA was negative for intracranial bleed.  Given that the patient is well-appearing work-up is negative, she is okay for discharge.  Strict return precautions aftercare instructions and she will follow-up with her PCP.  Final Clinical Impressions(s) / ED Diagnoses   Final diagnoses:  Essential hypertension  Cerebral aneurysm  Nonintractable headache, unspecified chronicity pattern, unspecified headache type    ED Discharge Orders    None       Tillie Fantasia, MD 03/27/19 Virgel Bouquet    Elnora Morrison, MD 03/28/19 [phone]

## 2019-03-28 ENCOUNTER — Ambulatory Visit: Payer: BLUE CROSS/BLUE SHIELD

## 2019-03-30 ENCOUNTER — Telehealth (HOSPITAL_COMMUNITY): Payer: Self-pay

## 2019-03-30 NOTE — Telephone Encounter (Signed)
Called pt regarding recent mra, no answer, left vm. AW 

## 2019-03-31 ENCOUNTER — Telehealth (HOSPITAL_COMMUNITY): Payer: Self-pay

## 2019-03-31 NOTE — Telephone Encounter (Signed)
Pt agreed to f/u in 1 year with mra. AW  °

## 2019-04-04 ENCOUNTER — Encounter: Payer: Self-pay | Admitting: Physical Therapy

## 2019-04-04 ENCOUNTER — Other Ambulatory Visit: Payer: Self-pay

## 2019-04-04 ENCOUNTER — Ambulatory Visit: Payer: BLUE CROSS/BLUE SHIELD | Attending: Family Medicine | Admitting: Physical Therapy

## 2019-04-04 DIAGNOSIS — R2689 Other abnormalities of gait and mobility: Secondary | ICD-10-CM | POA: Diagnosis present

## 2019-04-04 DIAGNOSIS — G8929 Other chronic pain: Secondary | ICD-10-CM | POA: Insufficient documentation

## 2019-04-04 DIAGNOSIS — M5442 Lumbago with sciatica, left side: Secondary | ICD-10-CM | POA: Diagnosis present

## 2019-04-04 DIAGNOSIS — M25562 Pain in left knee: Secondary | ICD-10-CM | POA: Diagnosis present

## 2019-04-04 MED FILL — glipiZIDE 10 MG TABS: 10 | 30 days supply | Qty: 60 | Fill #1

## 2019-04-04 NOTE — Therapy (Addendum)
[place], [place], [address] Phone: [phone]   Fax:  [phone]  Physical Therapy Evaluation/Discharge   Patient Details  Name: Paula [name] MRN: [id number] Date of Birth: 1945-12-04 Referring Provider (PT): Dr Charlott Rakes    Encounter Date: 04/04/2019  PT End of Session - 04/04/19 1020    Visit Number  3    Number of Visits  12    Date for PT Re-Evaluation  05/02/19    Authorization Type  [employer] local    PT Start Time  1010    PT Stop Time  1050    PT Time Calculation (min)  40 min    Activity Tolerance  Patient tolerated treatment well    Behavior During Therapy  [facility] for tasks assessed/performed       Past Medical History:  Diagnosis Date  . Allergic rhinitis   . Arthritis   . Asthma   . Brain aneurysm   . Chronic kidney disease    Stage 3  . Cough   . Diabetes mellitus   . Diastolic CHF, chronic ([place]) 10/11/2011  . GERD (gastroesophageal reflux disease)   . History of colon polyps 2012   tubular adenoma   . Hyperlipidemia   . Hypertension   . Neuropathy 10/11/2011  . PONV (postoperative nausea and vomiting)    one time after lymph node surgery    Past Surgical History:  Procedure Laterality Date  . CATARACT EXTRACTION     right eye  . IR 3D INDEPENDENT WKST  12/14/2017  . IR ANGIO INTRA EXTRACRAN SEL COM CAROTID INNOMINATE BILAT MOD SED  09/15/2017  . IR ANGIO INTRA EXTRACRAN SEL COM CAROTID INNOMINATE UNI L MOD SED  12/14/2017  . IR ANGIO VERTEBRAL SEL VERTEBRAL BILAT MOD SED  09/15/2017  . IR RADIOLOGIST EVAL & MGMT  09/10/2017  . IR RADIOLOGIST EVAL & MGMT  10/19/2017  . lymphatic mass surgery    . lymphoma mass surgery Bilateral    non cancerous  . NASAL TURBINATE REDUCTION    . RADIOLOGY WITH ANESTHESIA N/A 12/14/2017   Procedure: RADIOLOGY WITH ANESTHESIA EMBOLIZATION;  Surgeon: Luanne Bras, MD;  Location: [place];  Service: Radiology;  Laterality: N/A;  . VIDEO  BRONCHOSCOPY Bilateral 11/18/2017   Procedure: VIDEO BRONCHOSCOPY WITHOUT FLUORO;  Surgeon: Collene Gobble, MD;  [facility];  Service: Cardiopulmonary;  Laterality: Bilateral;    There were no vitals filed for this visit.   Subjective Assessment - 04/04/19 1120    Subjective  Patient has not been able to attend PT 2nd to other medical complications. She has had issues with her kidneys. She has not been able to be as consistent with her exercises. Her back is sore on the left today. She woke up with it being sore.    Limitations  Standing;Walking    How long can you sit comfortably?  < 5 minutes    How long can you stand comfortably?  knee crepitus with any standing    How long can you walk comfortably?  feels like she has to hold her back sometimes to be able to walk    Currently in Pain?  Yes    Pain Score  4     Pain Location  Back    Pain Orientation  Left    Pain Descriptors / Indicators  Aching    Pain Type  Chronic pain    Pain Onset  More than a month ago  Pain Frequency  Intermittent    Aggravating Factors   sitting    Pain Relieving Factors  standing and walking    Effect of Pain on Daily Activities  difficulty ditting for any period of time.         [facility] PT Assessment - 04/04/19 0001      Assessment   Medical Diagnosis  Low back and knee pain     Referring Provider (PT)  Dr Charlott Rakes       AROM   Lumbar Flexion  limited 50%     Lumbar - Left Rotation  limited 25% with pain       Strength   Right Hip Flexion  4+/5    Right Hip ABduction  4+/5    Right Hip ADduction  4+/5    Left Hip Flexion  4/5    Left Hip ABduction  4/5    Left Hip ADduction  4/5    Left Knee Flexion  4/5    Left Knee Extension  4/5      Palpation   Palpation comment  improved from intial eval but still moderate spasming of the lower back.                 Objective measurements completed on examination: See above findings.      [facility]  Treatment/Exercise - 04/04/19 0001      Lumbar Exercises: Stretches   Active Hamstring Stretch Limitations  seated 2x20 sechold bilateral     Lower Trunk Rotation Limitations  x10     Piriformis Stretch Limitations  3x20 with left       Lumbar Exercises: Supine   AB Set Limitations  reviewed proper abdominal breathing patient picked up quickly 2x10     Clam Limitations  2x10 red     Bent Knee Raise Limitations  2x10     Other Supine Lumbar Exercises  ball squeeze with breathing 2x10       Manual Therapy   Soft tissue mobilization  to left lumbar spine. Improved tolerance to light touch     Passive ROM  Hip PROM into flexion IR an ER     Manual Traction  bilateral LE with grade 3 oscillations                PT Short Term Goals - 04/04/19 1456      PT SHORT TERM GOAL #1   Title  Patient will increase lumbar flexion by 25%    Baseline  increased by 25%    Time  4    Period  Weeks    Status  Achieved    Target Date  05/02/19      PT SHORT TERM GOAL #2   Title  Patient will increase left hip flexion and abduction to 4/5    Baseline  4/5    Time  4    Period  Weeks    Status  Achieved    Target Date  05/02/19      PT SHORT TERM GOAL #3   Title  Patient will be independent with basic exercises to improve left leg strength and decrease spasming    Baseline  updated HEP    Time  4    Period  Weeks    Status  Achieved        PT Long Term Goals - 04/04/19 1457      PT LONG TERM GOAL #1   Title  Patient will stand  for 1/2 hour without increased pain    Period  Weeks    Status  On-going    Target Date  05/30/19      PT LONG TERM GOAL #2   Title  Patient will demonstrate 4+/5 gross left LE strength in order to perfrom daily activity    Time  6    Period  Weeks    Status  On-going    Target Date  05/30/19      PT LONG TERM GOAL #3   Title  Patient will sit for 45 minutes without increased pain in order to perfrom ADL's and IADL's    Time  6    Period  Weeks     Status  On-going    Target Date  05/30/19             Plan - 04/04/19 1028    Clinical Impression Statement  Patient had moderate spasming in her lower left lumbar spine. She had improved pain with manual therapy. She had been doing her exercises consitently but as of recently she has been unable to because of multiple MD appointments. She tolerated exercises well today. She was given an updated HEP. She will begin working on her exercises again. She would benefit from further therapy 2W4. She is unsure what her schedule will look like. She reports she will call back to schedule.    Personal Factors and Comorbidities  Comorbidity 1;Age    Examination-Activity Limitations  Bend;Dressing;Carry;Locomotion Level    Examination-Participation Restrictions  Shop;Laundry;Community Activity    Clinical Decision Making  Moderate    Rehab Potential  Good    PT Frequency  2x / week    PT Duration  8 weeks    PT Treatment/Interventions  ADLs/Self Care Home Management;Cryotherapy;Electrical Stimulation;Moist Heat;Iontophoresis 25m/ml Dexamethasone;DME Instruction;Therapeutic exercise;Therapeutic activities;Functional mobility training;Neuromuscular re-education;Patient/family education;Gait training;Manual techniques;Passive range of motion    PT Next Visit Plan  soft tissue mobilization to the left hip; patellar mobilizations; add lumbar march with TA, hip abduction with TA hip aduction with TA; consider LAD. modalities PRN    PT Home Exercise Plan  LTR, piriformis stretch, tennis ball release, patellar glides    Consulted and Agree with Plan of Care  Patient       Patient will benefit from skilled therapeutic intervention in order to improve the following deficits and impairments:  Abnormal gait, Decreased endurance, Decreased mobility, Difficulty walking, Increased muscle spasms, Decreased range of motion, Improper body mechanics, Decreased activity tolerance, Decreased safety awareness, Pain,  Postural dysfunction, Impaired UE functional use  Visit Diagnosis: Chronic left-sided low back pain with left-sided sciatica  Chronic pain of left knee  Other abnormalities of gait and mobility     Problem List Patient Active Problem List   Diagnosis Date Noted  . Upper airway cough syndrome 10/12/2017  . Chronic rhinitis 10/01/2017  . Seasonal allergic conjunctivitis 10/01/2017  . Aneurysm ([place] 09/24/2017  . Acute metabolic encephalopathy 079/43/2761 . URI (upper respiratory infection) 09/02/2017  . Facial weakness 09/02/2017  . Benign essential HTN 09/02/2017  . Allergic rhinitis 08/20/2017  . Chronic pain 05/07/2017  . Palpitations 07/10/2016  . Insomnia 03/25/2016  . Gout 01/23/2016  . History of cataract 02/27/2015  . Seasonal and perennial allergic rhinitis 02/15/2012  . Cough 11/04/2011  . Diabetic neuropathy ([facility] 10/11/2011  . Chronic diastolic congestive heart failure ([facility] 10/11/2011  . CKD (chronic kidney disease) stage 3, GFR 30-59 ml/min ([facility]) 10/10/2011  . GERD (gastroesophageal reflux disease)   .  Type 2 diabetes mellitus without complication, without long-term current use of insulin ([place]) 07/03/2010  . Hyperlipidemia 07/03/2010  . Essential hypertension 07/03/2010    PHYSICAL THERAPY DISCHARGE SUMMARY  Visits from Start of Care: 3  Current functional level related to goals / functional outcomes: Unknown patient did not return    Remaining deficits: Unknown    Education / Equipment: HEP   Plan: Patient agrees to discharge.  Patient goals were not met. Patient is being discharged due to not returning since the last visit.  ?????      Carney Living  PT DPT  04/04/2019, 2:58 PM  [facility] [address] [place], [place], [address] Phone: [phone]   Fax:  [phone]  Name: Paula [name] MRN: [id number] Date of Birth: [birth year]

## 2019-04-06 ENCOUNTER — Encounter: Payer: Self-pay | Admitting: Family Medicine

## 2019-04-06 ENCOUNTER — Ambulatory Visit: Payer: BLUE CROSS/BLUE SHIELD | Attending: Family Medicine | Admitting: Family Medicine

## 2019-04-06 ENCOUNTER — Other Ambulatory Visit: Payer: Self-pay

## 2019-04-06 VITALS — BP 173/79 | HR 60 | Temp 98.2°F | Ht 62.0 in | Wt 152.6 lb

## 2019-04-06 DIAGNOSIS — I10 Essential (primary) hypertension: Secondary | ICD-10-CM

## 2019-04-06 DIAGNOSIS — E1122 Type 2 diabetes mellitus with diabetic chronic kidney disease: Secondary | ICD-10-CM | POA: Diagnosis not present

## 2019-04-06 DIAGNOSIS — E0841 Diabetes mellitus due to underlying condition with diabetic mononeuropathy: Secondary | ICD-10-CM

## 2019-04-06 DIAGNOSIS — N183 Chronic kidney disease, stage 3 unspecified: Secondary | ICD-10-CM

## 2019-04-06 LAB — GLUCOSE, POCT (MANUAL RESULT ENTRY): POC Glucose: 161 mg/dl — AB (ref 70–99)

## 2019-04-06 MED ORDER — AMLODIPINE BESYLATE 5 MG PO TABS: 5.0000 mg | ORAL_TABLET | Freq: Every day | ORAL | 3 refills | Status: DC

## 2019-04-06 MED ORDER — CARVEDILOL 3.125 MG PO TABS: 3.1250 mg | ORAL_TABLET | Freq: Two times a day (BID) | ORAL | 3 refills | Status: DC

## 2019-04-06 MED ORDER — DULOXETINE HCL 60 MG PO CPEP: 60.0000 mg | ORAL_CAPSULE | Freq: Every day | ORAL | 3 refills | Status: DC

## 2019-04-06 MED FILL — CARVEDILOL 3.125 MG TABLET: 3.125 | 30 days supply | Qty: 60 | Fill #0

## 2019-04-06 MED FILL — DULoxetine HCL 60 MG CPEP: 60 | 30 days supply | Qty: 30 | Fill #0

## 2019-04-06 MED FILL — AMLODIPINE BESYLATE 5 MG TA: 5 | 30 days supply | Qty: 30 | Fill #0

## 2019-04-06 NOTE — Progress Notes (Signed)
Subjective:  Patient ID: Paula [name], female    DOB: 1945/08/23  Age: 73 y.o. 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  She had an ED visit for headaches 2 weeks ago, was severely hypertensive and time CTA of the head performed was negative for intracranial bleed.  Blood pressure improved with hydralazine and she was subsequently discharged.  She presents today informing me her nephrologist discontinued a bunch of her medications and recommended she remain on glipizide, isosorbide, verapamil. She complains of pain in the bottom of both feet which is described as burning.  Previously on gabapentin which she has since discontinued. Her blood pressure is elevated and she informs me she was still taking the 3.125 mg of carvedilol despite the fact that it was increased to 6.25 at her last office visit.  She complains she had dyspnea on taking the increased dose.  Verapamil had been decreased from 80 mg to 40 mg due to bradycardia however she has still been taking the 80 mg of verapamil. Past Medical History:  Diagnosis Date  . Allergic rhinitis   . Arthritis   . Asthma   . Brain aneurysm   . Chronic kidney disease    Stage 3  . Cough   . Diabetes mellitus   . Diastolic CHF, chronic ([place]) 10/11/2011  . GERD (gastroesophageal reflux disease)   . History of colon polyps 2012   tubular adenoma   . Hyperlipidemia   . Hypertension   . Neuropathy 10/11/2011  . PONV (postoperative nausea and vomiting)    one time after lymph node surgery    Past Surgical History:  Procedure  Laterality Date  . CATARACT EXTRACTION     right eye  . IR 3D INDEPENDENT WKST  12/14/2017  . IR ANGIO INTRA EXTRACRAN SEL COM CAROTID INNOMINATE BILAT MOD SED  09/15/2017  . IR ANGIO INTRA EXTRACRAN SEL COM CAROTID INNOMINATE UNI L MOD SED  12/14/2017  . IR ANGIO VERTEBRAL SEL VERTEBRAL BILAT MOD SED  09/15/2017  . IR RADIOLOGIST EVAL & MGMT  09/10/2017  . IR RADIOLOGIST EVAL & MGMT  10/19/2017  . lymphatic mass surgery    . lymphoma mass surgery Bilateral    non cancerous  . NASAL TURBINATE REDUCTION    . RADIOLOGY WITH ANESTHESIA N/A 12/14/2017   Procedure: RADIOLOGY WITH ANESTHESIA EMBOLIZATION;  Surgeon: Luanne Bras, MD;  Location: [place];  Service: Radiology;  Laterality: N/A;  . VIDEO BRONCHOSCOPY Bilateral 11/18/2017   Procedure: VIDEO BRONCHOSCOPY WITHOUT FLUORO;  Surgeon: Collene Gobble, MD;  [facility];  Service: Cardiopulmonary;  Laterality: Bilateral;    Family History  Problem Relation Age of Onset  . Hypertension Mother   . Asthma Sister   . Colon cancer Neg Hx   . Allergic rhinitis Neg Hx   . Eczema Neg Hx     Allergies  Allergen Reactions  . Dust Mite Extract Cough  . Ace Inhibitors Cough  . Lovastatin Other (See Comments)    Generalized body pain    Outpatient Medications Prior to Visit  Medication Sig Dispense Refill  . glipiZIDE (GLUCOTROL) 10  MG tablet Take 1 tablet (10 mg total) by mouth 2 (two) times daily before a meal. 180 tablet 1  . verapamil (CALAN) 80 MG tablet Take 80 mg by mouth 2 (two) times daily.    . carvedilol (COREG) 6.25 MG tablet Take 1 tablet (6.25 mg total) by mouth 2 (two) times daily with a meal. 180 tablet 1  . acetaminophen (TYLENOL) 500 MG tablet Take 500-1,000 mg by mouth every 6 (six) hours as needed for headache (pain).     Marland Kitchen albuterol (VENTOLIN HFA) 108 (90 Base) MCG/ACT inhaler Inhale 2 puffs into the lungs every 6 (six) hours as needed for wheezing or shortness of breath. (Patient not taking: Reported on 04/06/2019) 18  g 3  . aspirin EC 81 MG tablet Take 81 mg by mouth every evening.    Marland Kitchen azelastine (ASTELIN) 0.1 % nasal spray Place 2 sprays into both nostrils 2 (two) times daily. (Patient not taking: Reported on 04/06/2019) 30 mL 5  . benzonatate (TESSALON) 100 MG capsule Take 1 capsule (100 mg total) by mouth 3 (three) times daily as needed for cough. (Patient not taking: Reported on 09/20/2018) 30 capsule 0  . Blood Glucose Monitoring Suppl (CONTOUR NEXT MONITOR) w/Device KIT 1 kit by Does not apply route 3 (three) times daily. (Patient not taking: Reported on 04/06/2019) 1 kit 0  . cetirizine (ZYRTEC) 10 MG tablet Take 1 tablet (10 mg total) by mouth daily. (Patient not taking: Reported on 03/21/2019) 30 tablet 1  . diclofenac sodium (VOLTAREN) 1 % GEL Apply 4 g topically 4 (four) times daily. (Patient not taking: Reported on 03/21/2019) 100 g 3  . fluticasone (FLONASE) 50 MCG/ACT nasal spray Place 2 sprays into both nostrils daily. (Patient not taking: Reported on 07/16/2018) 16 g 5  . fluticasone (FLONASE) 50 MCG/ACT nasal spray Place 2 sprays into both nostrils daily. (Patient not taking: Reported on 03/21/2019) 16 g 5  . glucose blood test strip USE AS DIRECTED 3 TIMES DAILY. (Patient not taking: Reported on 04/06/2019) 100 each 12  . hydrocortisone 1 % ointment Apply 1 application topically 2 (two) times daily. (Patient not taking: Reported on 03/21/2019) 30 g 1  . isosorbide mononitrate (IMDUR) 60 MG 24 hr tablet Take 1 tablet (60 mg total) by mouth daily. (Patient not taking: Reported on 04/06/2019) 90 tablet 1  . lidocaine (LIDODERM) 5 % Place 1 patch onto the skin daily. Remove & Discard patch within 12 hours or as directed by MD (Patient not taking: Reported on 03/21/2019) 30 patch 1  . methocarbamol (ROBAXIN) 500 MG tablet Take 1 tablet (500 mg total) by mouth 2 (two) times daily as needed for muscle spasms. (Patient not taking: Reported on 03/21/2019) 60 tablet 1  . Microlet Lancets MISC Use to check blood sugar  up to 3 times daily. (Patient not taking: Reported on 04/06/2019) 100 each 11  . montelukast (SINGULAIR) 10 MG tablet Take 1 tablet (10 mg total) by mouth at bedtime. (Patient not taking: Reported on 03/21/2019) 30 tablet 5  . pantoprazole (PROTONIX) 40 MG tablet Take 1 tablet (40 mg total) by mouth daily. (Patient not taking: Reported on 03/21/2019) 90 tablet 1  . rosuvastatin (CRESTOR) 5 MG tablet Take 1 tablet (5 mg total) by mouth every evening. (Patient not taking: Reported on 03/26/2019) 90 tablet 1  . sodium chloride (OCEAN) 0.65 % SOLN nasal spray Place 2 sprays into both nostrils as needed for congestion. (Patient not taking: Reported on 04/06/2019) 30 mL 3  .  gabapentin (NEURONTIN) 300 MG capsule Take 1 capsule (300 mg total) by mouth at bedtime. (Patient not taking: Reported on 03/21/2019) 90 capsule 1  . verapamil (CALAN) 40 MG tablet Take 1 tablet (40 mg total) by mouth 2 (two) times daily. (Patient not taking: Reported on 04/06/2019) 90 tablet 1   No facility-administered medications prior to visit.      ROS Review of Systems  Constitutional: Negative for activity change, appetite change and fatigue.  HENT: Negative for congestion, sinus pressure and sore throat.   Eyes: Negative for visual disturbance.  Respiratory: Negative for cough, chest tightness, shortness of breath and wheezing.   Cardiovascular: Negative for chest pain and palpitations.  Gastrointestinal: Negative for abdominal distention, abdominal pain and constipation.  Endocrine: Negative for polydipsia.  Genitourinary: Negative for dysuria and frequency.  Musculoskeletal: Negative for arthralgias and back pain.  Skin: Negative for rash.  Neurological: Positive for numbness. Negative for tremors and light-headedness.  Hematological: Does not bruise/bleed easily.  Psychiatric/Behavioral: Negative for agitation and behavioral problems.    Objective:  BP (!) 173/79   Pulse 60   Temp 98.2 F (36.8 C) (Oral)   Ht 5'  2" (1.575 m)   Wt 152 lb 9.6 oz (69.2 kg)   LMP  (LMP Unknown)   SpO2 99%   BMI 27.91 kg/m   BP/Weight 04/06/2019 03/26/2019 3/66/4403  Systolic BP 474 259 563  Diastolic BP 79 89 65  Wt. (Lbs) 152.6 150 155  BMI 27.91 27.44 28.35      Physical Exam Constitutional:      Appearance: She is well-developed.  Cardiovascular:     Rate and Rhythm: Normal rate.     Heart sounds: Normal heart sounds. No murmur.  Pulmonary:     Effort: Pulmonary effort is normal.     Breath sounds: Normal breath sounds. No wheezing or rales.  Chest:     Chest wall: No tenderness.  Abdominal:     General: Bowel sounds are normal. There is no distension.     Palpations: Abdomen is soft. There is no mass.     Tenderness: There is no abdominal tenderness.  Musculoskeletal: Normal range of motion.  Neurological:     Mental Status: She is alert and oriented to person, place, and time.     CMP Latest Ref Rng & Units 03/26/2019 02/25/2019 02/03/2019  Glucose 70 - 99 mg/dL 200(H) 135(H) 114(H)  BUN 8 - 23 mg/dL 22 27 21   Creatinine 0.44 - 1.00 mg/dL 1.73(H) 2.29(H) 1.86(H)  Sodium 135 - 145 mmol/L 135 135 128(L)  Potassium 3.5 - 5.1 mmol/L 3.5 4.3 4.3  Chloride 98 - 111 mmol/L 99 94(L) 90(L)  CO2 22 - 32 mmol/L 24 28 25   Calcium 8.9 - 10.3 mg/dL 8.8(L) 9.3 9.3  Total Protein 6.5 - 8.1 g/dL 6.3(L) - 6.7  Total Bilirubin 0.3 - 1.2 mg/dL 0.5 - 0.3  Alkaline Phos 38 - 126 U/L 77 - 76  AST 15 - 41 U/L 20 - 19  ALT 0 - 44 U/L 15 - 13    Lipid Panel     Component Value Date/Time   CHOL 139 10/22/2018 1041   TRIG 88 10/22/2018 1041   HDL 44 10/22/2018 1041   CHOLHDL 3.2 10/22/2018 1041   CHOLHDL 2.9 01/25/2016 0901   VLDL 20 01/25/2016 0901   LDLCALC 77 10/22/2018 1041    CBC    Component Value Date/Time   WBC 5.0 03/26/2019 1648   RBC 4.19 03/26/2019 1648  HGB 11.8 (L) 03/26/2019 1648   HGB 12.4 09/02/2018 1211   HCT 34.7 (L) 03/26/2019 1648   HCT 37.3 09/02/2018 1211   PLT 186 03/26/2019  1648   PLT 203 09/02/2018 1211   MCV 82.8 03/26/2019 1648   MCV 87 09/02/2018 1211   MCH 28.2 03/26/2019 1648   MCHC 34.0 03/26/2019 1648   RDW 12.8 03/26/2019 1648   RDW 13.1 09/02/2018 1211   LYMPHSABS 0.8 03/26/2019 1648   LYMPHSABS 1.2 09/02/2018 1211   MONOABS 0.4 03/26/2019 1648   EOSABS 0.1 03/26/2019 1648   EOSABS 0.1 09/02/2018 1211   BASOSABS 0.0 03/26/2019 1648   BASOSABS 0.0 09/02/2018 1211    Lab Results  Component Value Date   HGBA1C 6.6 02/03/2019    Assessment & Plan:   1. Type 2 diabetes mellitus with stage 3 chronic kidney disease, without long-term current use of insulin ([place]) Controlled Diabetic visit at next office visit in 1 month - POCT glucose (manual entry)  2. Essential hypertension Uncontrolled There seems to be some discrepancy between her med list and what she is actually taking She is comfortable taking 3.125 mg of Coreg as the 6.25 causes dyspnea I have decreased her dose of Coreg from 6.25 to 3.125 and added amlodipine for optimization of blood pressure Encouraged to have her medications reconciled at his local specialist visit so we can all be on the same page We will reassess blood pressure at next office visit Counseled on blood pressure goal of less than 130/80, low-sodium, DASH diet, medication compliance, 150 minutes of moderate intensity exercise per week. Discussed medication compliance, adverse effects. - amLODipine (NORVASC) 5 MG tablet; Take 1 tablet (5 mg total) by mouth daily.  Dispense: 30 tablet; Refill: 3 - carvedilol (COREG) 3.125 MG tablet; Take 1 tablet (3.125 mg total) by mouth 2 (two) times daily with a meal.  Dispense: 60 tablet; Refill: 3  3. Diabetic mononeuropathy associated with diabetes mellitus due to underlying condition ([place]) Previously on gabapentin which was discontinued by her nephrologist Commenced on Cymbalta - DULoxetine (CYMBALTA) 60 MG capsule; Take 1 capsule (60 mg total) by mouth daily.  Dispense: 30  capsule; Refill: 3    Meds ordered this encounter  Medications  . amLODipine (NORVASC) 5 MG tablet    Sig: Take 1 tablet (5 mg total) by mouth daily.    Dispense:  30 tablet    Refill:  3  . carvedilol (COREG) 3.125 MG tablet    Sig: Take 1 tablet (3.125 mg total) by mouth 2 (two) times daily with a meal.    Dispense:  60 tablet    Refill:  3  . DULoxetine (CYMBALTA) 60 MG capsule    Sig: Take 1 capsule (60 mg total) by mouth daily.    Dispense:  30 capsule    Refill:  3    Follow-up: Return for Medical conditions, keep previously scheduled appointment.       Charlott Rakes, MD, FAAFP. [facility], [place]   04/06/2019, 2:15 PM

## 2019-04-06 NOTE — Progress Notes (Signed)
Patient states that she was informed by kidney doctor to stop all medications except Glipizide, verapamil, carvedilol  Patient is having pain in bottom of feet.

## 2019-04-12 NOTE — Progress Notes (Signed)
Cardiology Office Note   Date:  04/14/2019   ID:  Paula [name], DOB 09-07-1945, MRN [id number]  PCP:  Charlott Rakes, MD  Cardiologist:   Dorris Carnes, MD  F/U of HTN and HL       History of Present Illness: Paula [name] is a 73 y.o. 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 acetaminophen (TYLENOL) 500 MG tablet Take 500-1,000 mg by mouth every 6 (six) hours as needed for headache (pain).   Marland Kitchen amLODipine (NORVASC) 5 MG tablet Take 1 tablet (5 mg total) by mouth daily.  Marland Kitchen aspirin EC 81 MG tablet Take 81 mg by mouth every evening.  Marland Kitchen azelastine (ASTELIN) 0.1 % nasal spray Place 2 sprays into both nostrils 2 (two) times daily.  . Blood Glucose Monitoring Suppl (CONTOUR NEXT MONITOR) w/Device KIT 1 kit by Does not apply route 3 (three) times daily.  . carvedilol (COREG) 3.125 MG tablet Take 1 tablet (3.125 mg total) by mouth 2 (two) times daily with a meal.  . glipiZIDE (GLUCOTROL) 10 MG tablet Take 1 tablet (10 mg total) by mouth 2 (two) times daily before a meal.  . glucose blood test strip USE AS DIRECTED 3 TIMES DAILY.  . hydrocortisone 1 % ointment Apply 1 application topically 2 (two) times daily.  . isosorbide mononitrate (IMDUR) 60 MG 24 hr tablet Take 1 tablet (60 mg total) by mouth daily.  . Microlet Lancets MISC Use to check blood sugar up to 3 times daily.  . sodium chloride (OCEAN) 0.65 % SOLN nasal spray Place 2 sprays into both nostrils as needed for congestion.  . verapamil (CALAN) 80 MG tablet Take 80 mg by mouth 2 (two) times daily.     Allergies:   Dust mite extract, Ace  inhibitors, and Lovastatin   Past Medical History:  Diagnosis Date  . Allergic rhinitis   . Arthritis   . Asthma   . Brain aneurysm   . Chronic kidney disease    Stage 3  . Cough   . Diabetes mellitus   . Diastolic CHF, chronic ([place]) 10/11/2011  . GERD (gastroesophageal reflux disease)   . History of colon polyps 2012   tubular adenoma   . Hyperlipidemia   . Hypertension   . Neuropathy 10/11/2011  . PONV (postoperative nausea and vomiting)    one time after lymph node surgery    Past Surgical History:  Procedure Laterality Date  . CATARACT EXTRACTION     right eye  . IR 3D INDEPENDENT WKST  12/14/2017  . IR ANGIO INTRA EXTRACRAN SEL COM CAROTID INNOMINATE BILAT MOD SED  09/15/2017  . IR ANGIO INTRA EXTRACRAN SEL COM CAROTID INNOMINATE UNI L MOD SED  12/14/2017  . IR ANGIO VERTEBRAL SEL VERTEBRAL BILAT MOD SED  09/15/2017  . IR RADIOLOGIST EVAL & MGMT  09/10/2017  . IR RADIOLOGIST EVAL & MGMT  10/19/2017  . lymphatic mass surgery    . lymphoma mass surgery Bilateral  non cancerous  . NASAL TURBINATE REDUCTION    . RADIOLOGY WITH ANESTHESIA N/A 12/14/2017   Procedure: RADIOLOGY WITH ANESTHESIA EMBOLIZATION;  Surgeon: Luanne Bras, MD;  Location: [place];  Service: Radiology;  Laterality: N/A;  . VIDEO BRONCHOSCOPY Bilateral 11/18/2017   Procedure: VIDEO BRONCHOSCOPY WITHOUT FLUORO;  Surgeon: Collene Gobble, MD;  Location: [facility];  Service: Cardiopulmonary;  Laterality: Bilateral;     Social History:  The patient  reports that she has never smoked. She has never used smokeless tobacco. She reports that she does not drink alcohol or use drugs.   Family History:  The patient's family history includes Asthma in her sister; Hypertension in her mother.    ROS:  Please see the history of present illness. All other systems are reviewed and  Negative to the above problem except as noted.    PHYSICAL EXAM: VS:  BP 132/60   Pulse 69   Ht 5' 2"  (1.575 m)   Wt 152 lb (68.9 kg)    LMP  (LMP Unknown)   SpO2 98%   BMI 27.80 kg/m   GEN: Well nourished, well developed, in no acute distress  HEENT: normal  Neck: no JVD, carotid bruits, or masses Cardiac: RRR; no murmurs, rubs, or gallops,no edema  Respiratory:  clear to auscultation bilaterally, normal work of breathing GI: soft, nontender, nondistended, + BS  No hepatomegaly  MS: no deformity Moving all extremities   Skin: warm and dry, no rash Neuro:  Strength and sensation are intact Psych: euthymic mood, full affect   EKG:  EKG is ordered today.  On 8/17/2o SR 72 bpm     Lipid Panel    Component Value Date/Time   CHOL 139 10/22/2018 1041   TRIG 88 10/22/2018 1041   HDL 44 10/22/2018 1041   CHOLHDL 3.2 10/22/2018 1041   CHOLHDL 2.9 01/25/2016 0901   VLDL 20 01/25/2016 0901   LDLCALC 77 10/22/2018 1041      Wt Readings from Last 3 Encounters:  04/14/19 152 lb (68.9 kg)  04/06/19 152 lb 9.6 oz (69.2 kg)  03/26/19 150 lb (68 kg)      ASSESSMENT AND PLAN:  1  Hyperlipidemai   With vascular dz I would recomm tighter control olf lipids  Last chol panel LDL was 77     WOuld recomm she increase Crestor to 10 mg   2  HTN  Adequate control   COntinue meds     Current medicines are reviewed at length with the patient today.  The patient does not have concerns regarding medicines.  Signed, Dorris Carnes, MD  04/14/2019 11:19 PM    [facility] [address], [place], [place]  [address] Phone: [phone]; Fax: [phone]

## 2019-04-14 ENCOUNTER — Ambulatory Visit (INDEPENDENT_AMBULATORY_CARE_PROVIDER_SITE_OTHER): Payer: BLUE CROSS/BLUE SHIELD | Admitting: Internal Medicine

## 2019-04-14 ENCOUNTER — Encounter: Payer: Self-pay | Admitting: Internal Medicine

## 2019-04-14 ENCOUNTER — Other Ambulatory Visit: Payer: Self-pay

## 2019-04-14 VITALS — BP 132/60 | HR 69 | Ht 62.0 in | Wt 152.0 lb

## 2019-04-14 DIAGNOSIS — I10 Essential (primary) hypertension: Secondary | ICD-10-CM | POA: Diagnosis not present

## 2019-04-14 DIAGNOSIS — E782 Mixed hyperlipidemia: Secondary | ICD-10-CM

## 2019-04-14 MED ORDER — ROSUVASTATIN CALCIUM 10 MG PO TABS: 10.0000 mg | ORAL_TABLET | Freq: Every day | ORAL | 3 refills | Status: DC

## 2019-04-14 MED FILL — ROSUVASTATIN CALCIUM 10 MG: 10 | 90 days supply | Qty: 90 | Fill #0

## 2019-04-14 NOTE — Patient Instructions (Signed)
Medication Instructions:  Increase crestor to 10 mg every day  If you need a refill on your cardiac medications before your next appointment, please call your pharmacy.   Lab work: none If you have labs (blood work) drawn today and your tests are completely normal, you will receive your results only by: Marland Kitchen MyChart Message (if you have MyChart) OR . A paper copy in the mail If you have any lab test that is abnormal or we need to change your treatment, we will call you to review the results.  Testing/Procedures: none  Follow-Up: At [facility], you and your health needs are our priority.  As part of our continuing mission to provide you with exceptional heart care, we have created designated Provider Care Teams.  These Care Teams include your primary Cardiologist (physician) and Advanced Practice Providers (APPs -  Physician Assistants and Nurse Practitioners) who all work together to provide you with the care you need, when you need it. You will need a follow up appointment in:  12 months.  Please call our office 2 months in advance to schedule this appointment.  You may see Dr. Dorris Carnes or one of the following Advanced Practice Providers on your designated Care Team: Paula Dopp, PA-C [place], [place] . Paula Perch, NP  Any Other Special Instructions Will Be Listed Below (If Applicable).

## 2019-04-19 MED FILL — CARVEDILOL 3.125 MG TABLET: 3.125 | 30 days supply | Qty: 60 | Fill #0

## 2019-04-20 MED FILL — CONTOUR NEXT STRIPS: 30 days supply | Qty: 100 | Fill #4

## 2019-04-22 MED FILL — ROSUVASTATIN CALCIUM 10 MG: 10 | 90 days supply | Qty: 90 | Fill #0

## 2019-04-26 ENCOUNTER — Other Ambulatory Visit: Payer: Self-pay | Admitting: Family Medicine

## 2019-04-26 DIAGNOSIS — I10 Essential (primary) hypertension: Secondary | ICD-10-CM

## 2019-04-29 ENCOUNTER — Ambulatory Visit: Payer: Self-pay | Attending: Family Medicine

## 2019-04-29 ENCOUNTER — Other Ambulatory Visit: Payer: Self-pay | Admitting: Family Medicine

## 2019-04-29 ENCOUNTER — Other Ambulatory Visit: Payer: Self-pay

## 2019-04-29 DIAGNOSIS — I10 Essential (primary) hypertension: Secondary | ICD-10-CM

## 2019-05-02 ENCOUNTER — Other Ambulatory Visit: Payer: Self-pay | Admitting: Family Medicine

## 2019-05-02 ENCOUNTER — Telehealth: Payer: Self-pay | Admitting: Family Medicine

## 2019-05-02 DIAGNOSIS — I10 Essential (primary) hypertension: Secondary | ICD-10-CM

## 2019-05-02 MED ORDER — VERAPAMIL HCL 80 MG PO TABS: 80.0000 mg | ORAL_TABLET | Freq: Two times a day (BID) | ORAL | 6 refills | Status: DC

## 2019-05-02 MED FILL — AMLODIPINE BESYLATE 5 MG TA: 5 | 90 days supply | Qty: 90 | Fill #1

## 2019-05-02 NOTE — Telephone Encounter (Signed)
Will route to PCP for review. 

## 2019-05-02 NOTE — Telephone Encounter (Signed)
Patient was advised to go to the ED or UC if needing to get her medication refilled sooner.

## 2019-05-02 NOTE — Telephone Encounter (Signed)
1) Medication(s) Requested (by name): verapamil 2) Pharmacy of Choice: [facility]

## 2019-05-02 NOTE — Telephone Encounter (Signed)
Refilled

## 2019-05-03 MED ORDER — VERAPAMIL HCL 80 MG PO TABS: 80.0000 mg | ORAL_TABLET | Freq: Two times a day (BID) | ORAL | 1 refills | Status: DC

## 2019-05-03 MED FILL — VERAPAMIL 80 MG TABLET: 80 | 90 days supply | Qty: 180 | Fill #0

## 2019-05-03 NOTE — Telephone Encounter (Signed)
Patient was called and informed of medication being sent to the pharmacy.  Patient is requesting a 90 supply due to her not having a ride all the time.

## 2019-05-03 NOTE — Telephone Encounter (Signed)
Done

## 2019-05-10 ENCOUNTER — Other Ambulatory Visit: Payer: Self-pay

## 2019-05-10 ENCOUNTER — Encounter: Payer: Self-pay | Admitting: Family Medicine

## 2019-05-10 ENCOUNTER — Ambulatory Visit: Payer: Medicaid Other | Attending: Family Medicine | Admitting: Family Medicine

## 2019-05-10 VITALS — BP 145/78 | HR 74 | Temp 98.0°F | Ht 62.0 in | Wt 152.0 lb

## 2019-05-10 DIAGNOSIS — N183 Chronic kidney disease, stage 3 unspecified: Secondary | ICD-10-CM

## 2019-05-10 DIAGNOSIS — R05 Cough: Secondary | ICD-10-CM

## 2019-05-10 DIAGNOSIS — E1122 Type 2 diabetes mellitus with diabetic chronic kidney disease: Secondary | ICD-10-CM

## 2019-05-10 DIAGNOSIS — R059 Cough, unspecified: Secondary | ICD-10-CM

## 2019-05-10 DIAGNOSIS — I10 Essential (primary) hypertension: Secondary | ICD-10-CM

## 2019-05-10 LAB — POCT GLYCOSYLATED HEMOGLOBIN (HGB A1C): HbA1c, POC (controlled diabetic range): 6.7 % (ref 0.0–7.0)

## 2019-05-10 LAB — GLUCOSE, POCT (MANUAL RESULT ENTRY): POC Glucose: 181 mg/dl — AB (ref 70–99)

## 2019-05-10 NOTE — Progress Notes (Signed)
Subjective:  Patient ID: Paula [name], female    DOB: 16-Nov-1945  Age: 73 y.o. 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 Her blood pressure is elevated and she has been out of diltiazem due to communication problem with the pharmacy even though I had refilled it.  She also states she walked here from the bus stop. She complains of cough with postnasal drip and the symptoms have been chronic.  This coughing spells are usually accompanied by headache.  In the past she was on Flonase, Zyrtec, Singulair but she had stated this medications were discontinued by her nephrologist due to potential for nephrotoxicity.  Also seen by ENT in the past and referred for cough behavioral therapy. Her last visit with an 03/2019 with pulmonary. Past Medical History:  Diagnosis Date  . Allergic rhinitis   . Arthritis   . Asthma   . Brain aneurysm   . Chronic kidney disease    Stage 3  . Cough   . Diabetes mellitus   . Diastolic CHF, chronic ([place]) 10/11/2011  . GERD (gastroesophageal reflux disease)   . History of colon polyps 2012   tubular adenoma   . Hyperlipidemia   . Hypertension   . Neuropathy 10/11/2011  . PONV (postoperative nausea and vomiting)    one time after lymph node surgery    Past Surgical History:  Procedure Laterality Date  . CATARACT EXTRACTION     right eye  . IR 3D INDEPENDENT WKST  12/14/2017   . IR ANGIO INTRA EXTRACRAN SEL COM CAROTID INNOMINATE BILAT MOD SED  09/15/2017  . IR ANGIO INTRA EXTRACRAN SEL COM CAROTID INNOMINATE UNI L MOD SED  12/14/2017  . IR ANGIO VERTEBRAL SEL VERTEBRAL BILAT MOD SED  09/15/2017  . IR RADIOLOGIST EVAL & MGMT  09/10/2017  . IR RADIOLOGIST EVAL & MGMT  10/19/2017  . lymphatic mass surgery    . lymphoma mass surgery Bilateral    non cancerous  . NASAL TURBINATE REDUCTION    . RADIOLOGY WITH ANESTHESIA N/A 12/14/2017   Procedure: RADIOLOGY WITH ANESTHESIA EMBOLIZATION;  Surgeon: Luanne Bras, MD;  Location: [facility];  Service: Radiology;  Laterality: N/A;  . VIDEO BRONCHOSCOPY Bilateral 11/18/2017   Procedure: VIDEO BRONCHOSCOPY WITHOUT FLUORO;  Surgeon: Collene Gobble, MD;  [facility];  Service: Cardiopulmonary;  Laterality: Bilateral;    Family History  Problem Relation Age of Onset  . Hypertension Mother   . Asthma Sister   . Colon cancer Neg Hx   . Allergic rhinitis Neg Hx   . Eczema Neg Hx     Allergies  Allergen Reactions  . Dust Mite Extract Cough  . Ace Inhibitors Cough  . Lovastatin Other (See Comments)    Generalized body pain    Outpatient Medications Prior to Visit  Medication Sig Dispense Refill  . acetaminophen (TYLENOL) 500 MG tablet Take 500-1,000 mg by mouth every 6 (six) hours as needed for headache (  pain).     . amLODipine (NORVASC) 5 MG tablet Take 1 tablet (5 mg total) by mouth daily. 30 tablet 3  . aspirin EC 81 MG tablet Take 81 mg by mouth every evening.    . azelastine (ASTELIN) 0.1 % nasal spray Place 2 sprays into both nostrils 2 (two) times daily. 30 mL 5  . Blood Glucose Monitoring Suppl (CONTOUR NEXT MONITOR) w/Device KIT 1 kit by Does not apply route 3 (three) times daily. 1 kit 0  . carvedilol (COREG) 3.125 MG tablet Take 1 tablet (3.125 mg total) by mouth 2 (two) times daily with a meal. 60 tablet 3  . glipiZIDE (GLUCOTROL) 10 MG tablet Take 1 tablet (10 mg total) by mouth 2 (two) times daily  before a meal. 180 tablet 1  . glucose blood test strip USE AS DIRECTED 3 TIMES DAILY. 100 each 12  . hydrocortisone 1 % ointment Apply 1 application topically 2 (two) times daily. 30 g 1  . isosorbide mononitrate (IMDUR) 60 MG 24 hr tablet Take 1 tablet (60 mg total) by mouth daily. 90 tablet 1  . Microlet Lancets MISC Use to check blood sugar up to 3 times daily. 100 each 11  . rosuvastatin (CRESTOR) 10 MG tablet Take 1 tablet (10 mg total) by mouth daily. 90 tablet 3  . sodium chloride (OCEAN) 0.65 % SOLN nasal spray Place 2 sprays into both nostrils as needed for congestion. 30 mL 3  . verapamil (CALAN) 80 MG tablet Take 1 tablet (80 mg total) by mouth 2 (two) times daily. 180 tablet 1   No facility-administered medications prior to visit.      ROS Review of Systems  Constitutional: Negative for activity change, appetite change and fatigue.  HENT: Negative for congestion, sinus pressure and sore throat.   Eyes: Negative for visual disturbance.  Respiratory: Positive for cough. Negative for chest tightness, shortness of breath and wheezing.   Cardiovascular: Negative for chest pain and palpitations.  Gastrointestinal: Negative for abdominal distention, abdominal pain and constipation.  Endocrine: Negative for polydipsia.  Genitourinary: Negative for dysuria and frequency.  Musculoskeletal: Negative for arthralgias and back pain.  Skin: Negative for rash.  Neurological: Negative for tremors, light-headedness and numbness.  Hematological: Does not bruise/bleed easily.  Psychiatric/Behavioral: Negative for agitation and behavioral problems.    Objective:  BP (!) 145/78   Pulse 74   Temp 98 F (36.7 C) (Oral)   Ht 5' 2" (1.575 m)   Wt 152 lb (68.9 kg)   LMP  (LMP Unknown)   SpO2 98%   BMI 27.80 kg/m   BP/Weight 05/10/2019 04/14/2019 04/06/2019  Systolic BP 145 132 173  Diastolic BP 78 60 79  Wt. (Lbs) 152 152 152.6  BMI 27.8 27.8 27.91      Physical Exam Constitutional:       Appearance: She is well-developed.  Neck:     Vascular: No JVD.  Cardiovascular:     Rate and Rhythm: Normal rate.     Heart sounds: Normal heart sounds. No murmur.  Pulmonary:     Effort: Pulmonary effort is normal.     Breath sounds: Normal breath sounds. No wheezing or rales.  Chest:     Chest wall: No tenderness.  Abdominal:     General: Bowel sounds are normal. There is no distension.     Palpations: Abdomen is soft. There is no mass.     Tenderness: There is no abdominal tenderness.  Musculoskeletal: Normal range of motion.       Right lower leg: No edema.     Left lower leg: No edema.  Neurological:     Mental Status: She is alert and oriented to person, place, and time.  Psychiatric:        Mood and Affect: Mood normal.     CMP Latest Ref Rng & Units 03/26/2019 02/25/2019 02/03/2019  Glucose 70 - 99 mg/dL 200(H) 135(H) 114(H)  BUN 8 - 23 mg/dL 22 27 21  Creatinine 0.44 - 1.00 mg/dL 1.73(H) 2.29(H) 1.86(H)  Sodium 135 - 145 mmol/L 135 135 128(L)  Potassium 3.5 - 5.1 mmol/L 3.5 4.3 4.3  Chloride 98 - 111 mmol/L 99 94(L) 90(L)  CO2 22 - 32 mmol/L 24 28 25  Calcium 8.9 - 10.3 mg/dL 8.8(L) 9.3 9.3  Total Protein 6.5 - 8.1 g/dL 6.3(L) - 6.7  Total Bilirubin 0.3 - 1.2 mg/dL 0.5 - 0.3  Alkaline Phos 38 - 126 U/L 77 - 76  AST 15 - 41 U/L 20 - 19  ALT 0 - 44 U/L 15 - 13    Lipid Panel     Component Value Date/Time   CHOL 139 10/22/2018 1041   TRIG 88 10/22/2018 1041   HDL 44 10/22/2018 1041   CHOLHDL 3.2 10/22/2018 1041   CHOLHDL 2.9 01/25/2016 0901   VLDL 20 01/25/2016 0901   LDLCALC 77 10/22/2018 1041    CBC    Component Value Date/Time   WBC 5.0 03/26/2019 1648   RBC 4.19 03/26/2019 1648   HGB 11.8 (L) 03/26/2019 1648   HGB 12.4 09/02/2018 1211   HCT 34.7 (L) 03/26/2019 1648   HCT 37.3 09/02/2018 1211   PLT 186 03/26/2019 1648   PLT 203 09/02/2018 1211   MCV 82.8 03/26/2019 1648   MCV 87 09/02/2018 1211   MCH 28.2 03/26/2019 1648   MCHC 34.0  03/26/2019 1648   RDW 12.8 03/26/2019 1648   RDW 13.1 09/02/2018 1211   LYMPHSABS 0.8 03/26/2019 1648   LYMPHSABS 1.2 09/02/2018 1211   MONOABS 0.4 03/26/2019 1648   EOSABS 0.1 03/26/2019 1648   EOSABS 0.1 09/02/2018 1211   BASOSABS 0.0 03/26/2019 1648   BASOSABS 0.0 09/02/2018 1211    Lab Results  Component Value Date   HGBA1C 6.7 05/10/2019    Assessment & Plan:   1. Type 2 diabetes mellitus with stage 3 chronic kidney disease, without long-term current use of insulin (HCC) Controlled Continue current regimen Discussed management of hypoglycemia and she has been advised to decrease glipizide evening dose to 5 mg in the event that she has hypoglycemia Counseled on Diabetic diet, my plate method, 150 minutes of moderate intensity exercise/week Keep blood sugar logs with fasting goals of 80-120 mg/dl, random of less than 180 and in the event of sugars less than 60 mg/dl or greater than 400 mg/dl please notify the clinic ASAP. It is recommended that you undergo annual eye exams and annual foot exams. Pneumonia vaccine is recommended. - POCT glucose (manual entry) - POCT glycosylated hemoglobin (Hb A1C) - Ambulatory referral to Ophthalmology  2. Essential hypertension Elevated blood pressure States at home her blood pressures have been controlled despite not remaining on diltiazem Advised to resume diltiazem Counseled on blood pressure goal of less than 130/80, low-sodium, DASH diet, medication compliance, 150 minutes of moderate intensity exercise per week. Discussed medication compliance, adverse effects.   3. Cough Upper airway cough syndrome Treated for chronic sinusitis and seen by ENT and pulmonary Antihistamines, nasal sprays were discontinued by her nephrologist and   she had reported resolution at last visit Symptoms symptoms have returned and she has been advised to discuss this with her nephrologist so she can resume her medications.   No orders of the defined  types were placed in this encounter.   Follow-up: Return in about 3 months (around 08/09/2019) for medical conditions.       Charlott Rakes, MD, FAAFP. [facility] [place], [place]   05/10/2019, 4:00 PM

## 2019-05-10 NOTE — Patient Instructions (Signed)

## 2019-05-30 MED FILL — !VENTOLIN HFA INHALER: 108 (90 BAS | 25 days supply | Qty: 18 | Fill #0

## 2019-05-31 ENCOUNTER — Other Ambulatory Visit: Payer: Self-pay | Admitting: Pharmacist

## 2019-05-31 MED ORDER — TRUE METRIX BLOOD GLUCOSE TEST VI STRP: ORAL_STRIP | 11 refills | Status: DC

## 2019-05-31 MED ORDER — TRUEPLUS LANCETS 28G MISC: 11 refills | Status: DC

## 2019-05-31 MED ORDER — TRUE METRIX METER W/DEVICE KIT: PACK | 0 refills | Status: DC

## 2019-05-31 MED FILL — TRUE METRIX TEST STRIP: 25 days supply | Qty: 100 | Fill #0

## 2019-05-31 MED FILL — !TRUE METRIX BLOOD GLUCOSE: 365 days supply | Qty: 1 | Fill #0

## 2019-05-31 MED FILL — ISOSORBIDE MN ER 60 MG TAB: 60 | 30 days supply | Qty: 30 | Fill #1

## 2019-05-31 MED FILL — AZELASTINE HCL 137 MCG SPRY: 0.1 | 25 days supply | Qty: 30 | Fill #0

## 2019-05-31 MED FILL — TRUEplus LANCETS 28G MISC: 25 days supply | Qty: 100 | Fill #0

## 2019-06-09 ENCOUNTER — Ambulatory Visit: Payer: Self-pay

## 2019-06-29 MED FILL — ISOSORBIDE MN ER 60 MG TAB: 60 | 60 days supply | Qty: 60 | Fill #2

## 2019-07-04 ENCOUNTER — Other Ambulatory Visit: Payer: Self-pay | Admitting: Family Medicine

## 2019-07-04 DIAGNOSIS — I10 Essential (primary) hypertension: Secondary | ICD-10-CM

## 2019-07-04 NOTE — Telephone Encounter (Signed)
Please fill if appropriate. Last filled in [place]. Last CMP in August.

## 2019-07-12 MED FILL — ROSUVASTATIN CALCIUM 10 MG: 10 | 90 days supply | Qty: 90 | Fill #1

## 2019-07-12 MED FILL — TRUE METRIX TEST STRIP: 25 days supply | Qty: 100 | Fill #1

## 2019-07-18 MED FILL — glipiZIDE 10 MG TABS: 10 | 30 days supply | Qty: 60 | Fill #3

## 2019-07-20 MED FILL — CARVEDILOL 3.125 MG TABLET: 3.125 | 30 days supply | Qty: 60 | Fill #1

## 2019-07-27 ENCOUNTER — Other Ambulatory Visit: Payer: Self-pay | Admitting: Family Medicine

## 2019-07-27 DIAGNOSIS — I10 Essential (primary) hypertension: Secondary | ICD-10-CM

## 2019-07-27 MED FILL — AMLODIPINE BESYLATE 5 MG TA: 5 | 90 days supply | Qty: 90 | Fill #0

## 2019-07-28 MED FILL — ?AZELASTINE HCL 137 MCG SPR: 0.1 | 25 days supply | Qty: 30 | Fill #1

## 2019-08-02 ENCOUNTER — Encounter: Payer: Self-pay | Admitting: Family Medicine

## 2019-08-02 ENCOUNTER — Other Ambulatory Visit: Payer: Self-pay

## 2019-08-02 ENCOUNTER — Ambulatory Visit: Payer: Medicare Other | Attending: Family Medicine | Admitting: Family Medicine

## 2019-08-02 VITALS — BP 138/70 | HR 63 | Temp 98.2°F | Ht 62.0 in | Wt 153.0 lb

## 2019-08-02 DIAGNOSIS — E1121 Type 2 diabetes mellitus with diabetic nephropathy: Secondary | ICD-10-CM

## 2019-08-02 DIAGNOSIS — M79671 Pain in right foot: Secondary | ICD-10-CM | POA: Diagnosis not present

## 2019-08-02 DIAGNOSIS — R058 Other specified cough: Secondary | ICD-10-CM

## 2019-08-02 DIAGNOSIS — I10 Essential (primary) hypertension: Secondary | ICD-10-CM | POA: Diagnosis not present

## 2019-08-02 DIAGNOSIS — N1832 Chronic kidney disease, stage 3b: Secondary | ICD-10-CM | POA: Diagnosis not present

## 2019-08-02 DIAGNOSIS — E1122 Type 2 diabetes mellitus with diabetic chronic kidney disease: Secondary | ICD-10-CM | POA: Diagnosis not present

## 2019-08-02 DIAGNOSIS — R05 Cough: Secondary | ICD-10-CM | POA: Diagnosis not present

## 2019-08-02 DIAGNOSIS — N183 Chronic kidney disease, stage 3 unspecified: Secondary | ICD-10-CM

## 2019-08-02 LAB — POCT GLYCOSYLATED HEMOGLOBIN (HGB A1C): HbA1c, POC (controlled diabetic range): 7 % (ref 0.0–7.0)

## 2019-08-02 LAB — GLUCOSE, POCT (MANUAL RESULT ENTRY): POC Glucose: 200 mg/dl — AB (ref 70–99)

## 2019-08-02 MED ORDER — GLIPIZIDE 10 MG PO TABS: 10.0000 mg | ORAL_TABLET | Freq: Two times a day (BID) | ORAL | 1 refills | Status: DC

## 2019-08-02 MED ORDER — AMLODIPINE BESYLATE 5 MG PO TABS: 5.0000 mg | ORAL_TABLET | Freq: Every day | ORAL | 1 refills | Status: DC

## 2019-08-02 MED ORDER — CARVEDILOL 3.125 MG PO TABS: 3.1250 mg | ORAL_TABLET | Freq: Two times a day (BID) | ORAL | 1 refills | Status: DC

## 2019-08-02 MED ORDER — SITAGLIPTIN PHOSPHATE 25 MG PO TABS: 25.0000 mg | ORAL_TABLET | Freq: Every day | ORAL | 1 refills | Status: DC

## 2019-08-02 MED ORDER — VERAPAMIL HCL 80 MG PO TABS: 80.0000 mg | ORAL_TABLET | Freq: Two times a day (BID) | ORAL | 1 refills | Status: DC

## 2019-08-02 MED FILL — JANUVIA 25 MG TABLET: 25 | 30 days supply | Qty: 30 | Fill #0

## 2019-08-02 MED FILL — VERAPAMIL 80 MG TABLET: 80 | 90 days supply | Qty: 180 | Fill #0

## 2019-08-02 NOTE — Patient Instructions (Signed)
Foot Pain Many things can cause foot pain. Some common causes are:  An injury.  A sprain.  Arthritis.  Blisters.  Bunions. Follow these instructions at home: Managing pain, stiffness, and swelling If directed, put ice on the painful area:  Put ice in a plastic bag.  Place a towel between your skin and the bag.  Leave the ice on for 20 minutes, 2-3 times a day.  Activity  Do not stand or walk for long periods.  Return to your normal activities as told by your health care provider. Ask your health care provider what activities are safe for you.  Do stretches to relieve foot pain and stiffness as told by your health care provider.  Do not lift anything that is heavier than 10 lb (4.5 kg), or the limit that you are told, until your health care provider says that it is safe. Lifting a lot of weight can put added pressure on your feet. Lifestyle  Wear comfortable, supportive shoes that fit you well. Do not wear high heels.  Keep your feet clean and dry. General instructions  Take over-the-counter and prescription medicines only as told by your health care provider.  Rub your foot gently.  Pay attention to any changes in your symptoms.  Keep all follow-up visits as told by your health care provider. This is important. Contact a health care provider if:  Your pain does not get better after a few days of self-care.  Your pain gets worse.  You cannot stand on your foot. Get help right away if:  Your foot is numb or tingling.  Your foot or toes are swollen.  Your foot or toes turn white or blue.  You have warmth and redness along your foot. Summary  Common causes of foot pain are injury, sprain, arthritis, blisters or bunions.  Ice, medicines, and comfortable shoes may help foot pain.  Contact your health care provider if your pain does not get better after a few days of self-care. This information is not intended to replace advice given to you by your health  care provider. Make sure you discuss any questions you have with your health care provider. Document Released: 08/24/2015 Document Revised: 05/13/2018 Document Reviewed: 05/13/2018 Elsevier Patient Education  2020 [employer].

## 2019-08-02 NOTE — Progress Notes (Signed)
Bottom of right foot has a black patch on it. Still coughing.

## 2019-08-02 NOTE — Progress Notes (Signed)
Subjective:  Patient ID: Paula [name], female    DOB: Dec 22, 1945  Age: 73 y.o. 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   She exercises daily and tries to adhere to a diabetic diet but is concerned her sugars are elevated to about 300 even shen "she eats something small" she has been compliant with Glipizide. Complains of black spot on R sole which is chronic and worsening and is burning, she applies ice for relief. Previously on Gabapentin but this was discontinued by Nephrology. She would like to see a Podiatrist "for a nerve procedure". Compliant with her antihypertensive.  She continues to cough but this is not as bad as it was previously. She is no longer on her antihistamines or nasal sprays as "the kidney doctor says it will mess with my kidneys".  Past Medical History:  Diagnosis Date  . Allergic rhinitis   . Arthritis   . Asthma   . Brain aneurysm   . Chronic kidney disease    Stage 3  . Cough   . Diabetes mellitus   . Diastolic CHF, chronic ([place]) 10/11/2011  . GERD (gastroesophageal reflux disease)   . History of colon polyps 2012   tubular adenoma   . Hyperlipidemia   . Hypertension   . Neuropathy 10/11/2011  . PONV (postoperative nausea and vomiting)    one time after lymph node surgery    Past Surgical History:  Procedure Laterality Date  . CATARACT EXTRACTION     right eye  . IR 3D INDEPENDENT WKST  12/14/2017  . IR ANGIO INTRA EXTRACRAN SEL COM CAROTID INNOMINATE BILAT MOD SED  09/15/2017  . IR ANGIO INTRA EXTRACRAN SEL COM CAROTID INNOMINATE UNI L  MOD SED  12/14/2017  . IR ANGIO VERTEBRAL SEL VERTEBRAL BILAT MOD SED  09/15/2017  . IR RADIOLOGIST EVAL & MGMT  09/10/2017  . IR RADIOLOGIST EVAL & MGMT  10/19/2017  . lymphatic mass surgery    . lymphoma mass surgery Bilateral    non cancerous  . NASAL TURBINATE REDUCTION    . RADIOLOGY WITH ANESTHESIA N/A 12/14/2017   Procedure: RADIOLOGY WITH ANESTHESIA EMBOLIZATION;  Surgeon: Luanne Bras, MD;  Location: [facility];  Service: Radiology;  Laterality: N/A;  . VIDEO BRONCHOSCOPY Bilateral 11/18/2017   Procedure: VIDEO BRONCHOSCOPY WITHOUT FLUORO;  Surgeon: Collene Gobble, MD;  Location: [facility];  Service: Cardiopulmonary;  Laterality: Bilateral;    Family History  Problem Relation Age of Onset  . Hypertension Mother   . Asthma Sister   . Colon cancer Neg Hx   . Allergic rhinitis Neg Hx   . Eczema Neg Hx     Allergies  Allergen Reactions  . Dust Mite Extract Cough  . Ace Inhibitors Cough  . Lovastatin Other (See Comments)    Generalized body pain    Outpatient Medications Prior to Visit  Medication Sig Dispense Refill  . acetaminophen (TYLENOL) 500 MG tablet Take 500-1,000 mg by mouth every 6 (six) hours as needed for headache (pain).     Marland Kitchen aspirin EC 81 MG tablet Take 81 mg by mouth every evening.    Marland Kitchen azelastine (  ASTELIN) 0.1 % nasal spray Place 2 sprays into both nostrils 2 (two) times daily. 30 mL 5  . Blood Glucose Monitoring Suppl (TRUE METRIX METER) w/Device KIT Use to check blood sugar daily. 1 kit 0  . glucose blood (TRUE METRIX BLOOD GLUCOSE TEST) test strip Use as instructed 100 each 11  . hydrocortisone 1 % ointment Apply 1 application topically 2 (two) times daily. 30 g 1  . isosorbide mononitrate (IMDUR) 60 MG 24 hr tablet TAKE 1 TABLET (60 MG TOTAL) BY MOUTH DAILY. 60 tablet 1  . rosuvastatin (CRESTOR) 10 MG tablet Take 1 tablet (10 mg total) by mouth daily. 90 tablet 3  . sodium chloride (OCEAN) 0.65 % SOLN nasal spray Place 2 sprays into both nostrils as  needed for congestion. 30 mL 3  . TRUEplus Lancets 28G MISC Use to check blood sugar daily. 100 each 11  . amLODipine (NORVASC) 5 MG tablet TAKE 1 TABLET (5 MG TOTAL) BY MOUTH DAILY. 30 tablet 2  . carvedilol (COREG) 3.125 MG tablet Take 1 tablet (3.125 mg total) by mouth 2 (two) times daily with a meal. 60 tablet 3  . glipiZIDE (GLUCOTROL) 10 MG tablet Take 1 tablet (10 mg total) by mouth 2 (two) times daily before a meal. 180 tablet 1  . verapamil (CALAN) 80 MG tablet Take 1 tablet (80 mg total) by mouth 2 (two) times daily. 180 tablet 1   No facility-administered medications prior to visit.     ROS Review of Systems General: negative for fever, weight loss, appetite change Eyes: no visual symptoms. ENT: no ear symptoms, no sinus tenderness, no nasal congestion or sore throat. Neck: no pain  Respiratory: no wheezing, shortness of breath, +cough Cardiovascular: no chest pain, no dyspnea on exertion, no pedal edema, no orthopnea. Gastrointestinal: no abdominal pain, no diarrhea, no constipation Genito-Urinary: no urinary frequency, no dysuria, no polyuria. Hematologic: no bruising Endocrine: no cold or heat intolerance Neurological: no headaches, no seizures, no tremors Musculoskeletal: no joint pains, no joint swelling Skin: no pruritus, no rash. Psychological: no depression, no anxiety,    Objective:  BP 138/70   Pulse 63   Temp 98.2 F (36.8 C) (Oral)   Ht _0  (1.575 m)   Wt 153 lb (69.4 kg)   LMP  (LMP Unknown)   SpO2 97%   BMI 27.98 kg/m   BP/Weight 08/02/2019 2/75/1700 08/17/4942  Systolic BP 967 591 638  Diastolic BP 70 78 60  Wt. (Lbs) 153 152 152  BMI 27.98 27.8 27.8      Physical Exam Constitutional: normal appearing,  Eyes: PERRLA HEENT: Head is atraumatic, normal sinuses, normal oropharynx, normal appearing tonsils and palate, tympanic membrane is normal bilaterally. Neck: normal range of motion, no thyromegaly, no JVD Cardiovascular: normal rate and  rhythm, normal heart sounds, no murmurs, rub or gallop, no pedal edema Respiratory: Normal breath sounds, clear to auscultation bilaterally, no wheezes, no rales, no rhonchi Abdomen: soft, not tender to palpation, normal bowel sounds, no enlarged organs Musculoskeletal: Full ROM, no tenderness in joints, TTP of medial aspect of R sole  Skin: warm and dry, hyperpigmented patch on medial aspect of right sole Neurological: alert, oriented x3, cranial nerves I-XII grossly intact , normal motor strength, normal sensation. Psychological: normal mood.   CMP Latest Ref Rng & Units 03/26/2019 02/25/2019 02/03/2019  Glucose 70 - 99 mg/dL 200(H) 135(H) 114(H)  BUN 8 - 23 mg/dL _1 Creatinine 0.44 - 1.00 mg/dL 1.73(H) 2.29(H) 1.86(H)  Sodium 135 - 145 mmol/L 135 135 128(L)  Potassium 3.5 - 5.1 mmol/L 3.5 4.3 4.3  Chloride 98 - 111 mmol/L 99 94(L) 90(L)  CO2 22 - 32 mmol/L _0 Calcium 8.9 - 10.3 mg/dL 8.8(L) 9.3 9.3  Total Protein 6.5 - 8.1 g/dL 6.3(L) - 6.7  Total Bilirubin 0.3 - 1.2 mg/dL 0.5 - 0.3  Alkaline Phos 38 - 126 U/L 77 - 76  AST 15 - 41 U/L 20 - 19  ALT 0 - 44 U/L 15 - 13    Lipid Panel     Component Value Date/Time   CHOL 139 10/22/2018 1041   TRIG 88 10/22/2018 1041   HDL 44 10/22/2018 1041   CHOLHDL 3.2 10/22/2018 1041   CHOLHDL 2.9 01/25/2016 0901   VLDL 20 01/25/2016 0901   LDLCALC 77 10/22/2018 1041    CBC    Component Value Date/Time   WBC 5.0 03/26/2019 1648   RBC 4.19 03/26/2019 1648   HGB 11.8 (L) 03/26/2019 1648   HGB 12.4 09/02/2018 1211   HCT 34.7 (L) 03/26/2019 1648   HCT 37.3 09/02/2018 1211   PLT 186 03/26/2019 1648   PLT 203 09/02/2018 1211   MCV 82.8 03/26/2019 1648   MCV 87 09/02/2018 1211   MCH 28.2 03/26/2019 1648   MCHC 34.0 03/26/2019 1648   RDW 12.8 03/26/2019 1648   RDW 13.1 09/02/2018 1211   LYMPHSABS 0.8 03/26/2019 1648   LYMPHSABS 1.2 09/02/2018 1211   MONOABS 0.4 03/26/2019 1648   EOSABS 0.1 03/26/2019 1648   EOSABS 0.1  09/02/2018 1211   BASOSABS 0.0 03/26/2019 1648   BASOSABS 0.0 09/02/2018 1211    Lab Results  Component Value Date   HGBA1C 7.0 08/02/2019    Assessment & Plan:   1. Type 2 diabetes mellitus with stage 3 chronic kidney disease, without long-term current use of insulin (HCC) Controlled but given recent hyperglycemic values,renal dose of Januvia added Also has diabetic nephropathy, followed by Nephrology Unfortunately she has no medical coverage for CGM prescription Counseled on Diabetic diet, my plate method, 366 minutes of moderate intensity exercise/week Blood sugar logs with fasting goals of 80-120 mg/dl, random of less than 180 and in the event of sugars less than 60 mg/dl or greater than 400 mg/dl encouraged to notify the clinic. Advised on the need for annual eye exams, annual foot exams, Pneumonia vaccine. - Glucose (CBG) - HgB A1c - Ambulatory referral to Podiatry - sitaGLIPtin (JANUVIA) 25 MG tablet; Take 1 tablet (25 mg total) by mouth daily.  Dispense: 90 tablet; Refill: 1 - glipiZIDE (GLUCOTROL) 10 MG tablet; Take 1 tablet (10 mg total) by mouth 2 (two) times daily before a meal.  Dispense: 180 tablet; Refill: 1  2. Essential hypertension Controlled Counseled on blood pressure goal of less than 130/80, low-sodium, DASH diet, medication compliance, 150 minutes of moderate intensity exercise per week. Discussed medication compliance, adverse effects. - verapamil (CALAN) 80 MG tablet; Take 1 tablet (80 mg total) by mouth 2 (two) times daily.  Dispense: 180 tablet; Refill: 1 - carvedilol (COREG) 3.125 MG tablet; Take 1 tablet (3.125 mg total) by mouth 2 (two) times daily with a meal.  Dispense: 180 tablet; Refill: 1 - amLODipine (NORVASC) 5 MG tablet; Take 1 tablet (5 mg total) by mouth daily.  Dispense: 90 tablet; Refill: 1  3. Upper airway cough syndrome Uncontrolled Completed rehab Previously on antihistamiines and nasal sprays which she states were discontinued by  Nephrology  4. Right  foot pain Previously on Gabapentin which was discontinued by Nephrology She would like to see Podiatry and I have placed referral  Meds ordered this encounter  Medications  . sitaGLIPtin (JANUVIA) 25 MG tablet    Sig: Take 1 tablet (25 mg total) by mouth daily.    Dispense:  90 tablet    Refill:  1    Please dispense #90  . verapamil (CALAN) 80 MG tablet    Sig: Take 1 tablet (80 mg total) by mouth 2 (two) times daily.    Dispense:  180 tablet    Refill:  1    Please dispense 90-day supply  . glipiZIDE (GLUCOTROL) 10 MG tablet    Sig: Take 1 tablet (10 mg total) by mouth 2 (two) times daily before a meal.    Dispense:  180 tablet    Refill:  1    Please dispense #90  . carvedilol (COREG) 3.125 MG tablet    Sig: Take 1 tablet (3.125 mg total) by mouth 2 (two) times daily with a meal.    Dispense:  180 tablet    Refill:  1    Please dispense #90  . amLODipine (NORVASC) 5 MG tablet    Sig: Take 1 tablet (5 mg total) by mouth daily.    Dispense:  90 tablet    Refill:  1    Please dispense #90    Follow-up: Return in about 3 months (around 10/31/2019) for Chronic medical conditions.       Charlott Rakes, MD, FAAFP. [facility], [place]   08/02/2019, 4:05 PM

## 2019-08-08 MED FILL — glipiZIDE 10 MG TABS: 10 | 90 days supply | Qty: 180 | Fill #0

## 2019-08-18 MED FILL — CARVEDILOL 3.125 MG TABLET: 3.125 | 90 days supply | Qty: 180 | Fill #0

## 2019-08-23 ENCOUNTER — Telehealth: Payer: Self-pay | Admitting: Family Medicine

## 2019-08-23 NOTE — Telephone Encounter (Signed)
Pt called requesting a dental cleaning

## 2019-08-25 ENCOUNTER — Other Ambulatory Visit: Payer: Self-pay | Admitting: Pharmacist

## 2019-08-25 DIAGNOSIS — N1832 Chronic kidney disease, stage 3b: Secondary | ICD-10-CM

## 2019-08-25 DIAGNOSIS — E1122 Type 2 diabetes mellitus with diabetic chronic kidney disease: Secondary | ICD-10-CM

## 2019-08-25 MED ORDER — ACCU-CHEK FASTCLIX LANCETS MISC: 11 refills | Status: DC

## 2019-08-25 MED ORDER — ACCU-CHEK GUIDE VI STRP: ORAL_STRIP | 11 refills | Status: DC

## 2019-08-25 MED ORDER — LANCETS MISC: 11 refills | Status: DC

## 2019-08-25 MED ORDER — ACCU-CHEK GUIDE ME W/DEVICE KIT: 1.0000 | PACK | Freq: Every day | 0 refills | Status: DC

## 2019-08-25 MED FILL — ACCU-CHEK GUIDE W/DEVICE KI: W/DEVICE | 1 days supply | Qty: 1 | Fill #0

## 2019-08-25 MED FILL — ACCU-CHEK GUIDE TEST STRIP: 100 days supply | Qty: 100 | Fill #0

## 2019-08-25 MED FILL — ACCU-CHEK SOFTCLIX LANCETS: 30 days supply | Qty: 100 | Fill #0

## 2019-08-26 ENCOUNTER — Other Ambulatory Visit (INDEPENDENT_AMBULATORY_CARE_PROVIDER_SITE_OTHER): Payer: Self-pay

## 2019-08-26 DIAGNOSIS — I10 Essential (primary) hypertension: Secondary | ICD-10-CM

## 2019-08-26 MED ORDER — ISOSORBIDE MONONITRATE ER 60 MG PO TB24: 60.0000 mg | ORAL_TABLET | Freq: Every day | ORAL | 1 refills | Status: DC

## 2019-08-26 MED FILL — ISOSORBIDE MN ER 60 MG TAB: 60 | 90 days supply | Qty: 90 | Fill #0

## 2019-08-26 NOTE — Telephone Encounter (Signed)
Patient was informed that she would be mailed a listing of dental resources.

## 2019-09-02 ENCOUNTER — Other Ambulatory Visit: Payer: Self-pay

## 2019-09-02 ENCOUNTER — Ambulatory Visit: Payer: Medicaid Other | Admitting: Podiatry

## 2019-09-02 DIAGNOSIS — M79675 Pain in left toe(s): Secondary | ICD-10-CM | POA: Diagnosis not present

## 2019-09-02 DIAGNOSIS — M79674 Pain in right toe(s): Secondary | ICD-10-CM | POA: Diagnosis not present

## 2019-09-02 DIAGNOSIS — E119 Type 2 diabetes mellitus without complications: Secondary | ICD-10-CM

## 2019-09-02 DIAGNOSIS — B351 Tinea unguium: Secondary | ICD-10-CM

## 2019-09-02 NOTE — Patient Instructions (Signed)
Diabetes Mellitus and Foot Care Foot care is an important part of your health, especially when you have diabetes. Diabetes may cause you to have problems because of poor blood flow (circulation) to your feet and legs, which can cause your skin to:  Become thinner and drier.  Break more easily.  Heal more slowly.  Peel and crack. You may also have nerve damage (neuropathy) in your legs and feet, causing decreased feeling in them. This means that you may not notice minor injuries to your feet that could lead to more serious problems. Noticing and addressing any potential problems early is the best way to prevent future foot problems. How to care for your feet Foot hygiene  Wash your feet daily with warm water and mild soap. Do not use hot water. Then, pat your feet and the areas between your toes until they are completely dry. Do not soak your feet as this can dry your skin.  Trim your toenails straight across. Do not dig under them or around the cuticle. File the edges of your nails with an emery board or nail file.  Apply a moisturizing lotion or petroleum jelly to the skin on your feet and to dry, brittle toenails. Use lotion that does not contain alcohol and is unscented. Do not apply lotion between your toes. Shoes and socks  Wear clean socks or stockings every day. Make sure they are not too tight. Do not wear knee-high stockings since they may decrease blood flow to your legs.  Wear shoes that fit properly and have enough cushioning. Always look in your shoes before you put them on to be sure there are no objects inside.  To break in new shoes, wear them for just a few hours a day. This prevents injuries on your feet. Wounds, scrapes, corns, and calluses  Check your feet daily for blisters, cuts, bruises, sores, and redness. If you cannot see the bottom of your feet, use a mirror or ask someone for help.  Do not cut corns or calluses or try to remove them with medicine.  If you  find a minor scrape, cut, or break in the skin on your feet, keep it and the skin around it clean and dry. You may clean these areas with mild soap and water. Do not clean the area with peroxide, alcohol, or iodine.  If you have a wound, scrape, corn, or callus on your foot, look at it several times a day to make sure it is healing and not infected. Check for: ? Redness, swelling, or pain. ? Fluid or blood. ? Warmth. ? Pus or a bad smell. General instructions  Do not cross your legs. This may decrease blood flow to your feet.  Do not use heating pads or hot water bottles on your feet. They may burn your skin. If you have lost feeling in your feet or legs, you may not know this is happening until it is too late.  Protect your feet from hot and cold by wearing shoes, such as at the beach or on hot pavement.  Schedule a complete foot exam at least once a year (annually) or more often if you have foot problems. If you have foot problems, report any cuts, sores, or bruises to your health care provider immediately. Contact a health care provider if:  You have a medical condition that increases your risk of infection and you have any cuts, sores, or bruises on your feet.  You have an injury that is not   healing.  You have redness on your legs or feet.  You feel burning or tingling in your legs or feet.  You have pain or cramps in your legs and feet.  Your legs or feet are numb.  Your feet always feel cold.  You have pain around a toenail. Get help right away if:  You have a wound, scrape, corn, or callus on your foot and: ? You have pain, swelling, or redness that gets worse. ? You have fluid or blood coming from the wound, scrape, corn, or callus. ? Your wound, scrape, corn, or callus feels warm to the touch. ? You have pus or a bad smell coming from the wound, scrape, corn, or callus. ? You have a fever. ? You have a red line going up your leg. Summary  Check your feet every day  for cuts, sores, red spots, swelling, and blisters.  Moisturize feet and legs daily.  Wear shoes that fit properly and have enough cushioning.  If you have foot problems, report any cuts, sores, or bruises to your health care provider immediately.  Schedule a complete foot exam at least once a year (annually) or more often if you have foot problems. This information is not intended to replace advice given to you by your health care provider. Make sure you discuss any questions you have with your health care provider. Document Revised: 04/20/2019 Document Reviewed: 08/29/2016 Elsevier Patient Education  2020 Elsevier Inc.  

## 2019-09-06 MED FILL — JANUVIA 25 MG TABLET: 25 | 30 days supply | Qty: 30 | Fill #1

## 2019-09-08 ENCOUNTER — Encounter: Payer: Self-pay | Admitting: Podiatry

## 2019-09-08 NOTE — Progress Notes (Signed)
Subjective: Paula [name] presents today referred by Charlott Rakes, MD for complaint of for diabetic foot evaluation and painful mycotic nails b/l that are difficult to trim. Pain interferes with ambulation. Aggravating factors include wearing enclosed shoe gear. Pain is relieved with periodic professional debridement.   Past Medical History:  Diagnosis Date  . Allergic rhinitis   . Arthritis   . Asthma   . Brain aneurysm   . Chronic kidney disease    Stage 3  . Cough   . Diabetes mellitus   . Diastolic CHF, chronic ([place]) 10/11/2011  . GERD (gastroesophageal reflux disease)   . History of colon polyps 2012   tubular adenoma   . Hyperlipidemia   . Hypertension   . Neuropathy 10/11/2011  . PONV (postoperative nausea and vomiting)    one time after lymph node surgery     Patient Active Problem List   Diagnosis Date Noted  . Upper airway cough syndrome 10/12/2017  . Chronic rhinitis 10/01/2017  . Seasonal allergic conjunctivitis 10/01/2017  . Aneurysm ([place]) 09/24/2017  . Acute metabolic encephalopathy 54/27/0623  . URI (upper respiratory infection) 09/02/2017  . Facial weakness 09/02/2017  . Benign essential HTN 09/02/2017  . Allergic rhinitis 08/20/2017  . Chronic pain 05/07/2017  . Palpitations 07/10/2016  . Insomnia 03/25/2016  . Gout 01/23/2016  . History of cataract 02/27/2015  . Seasonal and perennial allergic rhinitis 02/15/2012  . Cough 11/04/2011  . Diabetic neuropathy ([place]) 10/11/2011  . Chronic diastolic congestive heart failure ([place]) 10/11/2011  . CKD (chronic kidney disease) stage 3, GFR 30-59 ml/min (HCC) 10/10/2011  . GERD (gastroesophageal reflux disease)   . Type 2 diabetes mellitus without complication, without long-term current use of insulin ([place]) 07/03/2010  . Hyperlipidemia 07/03/2010  . Essential hypertension 07/03/2010     Past Surgical History:  Procedure Laterality Date  . CATARACT EXTRACTION     right eye  . IR 3D INDEPENDENT WKST   12/14/2017  . IR ANGIO INTRA EXTRACRAN SEL COM CAROTID INNOMINATE BILAT MOD SED  09/15/2017  . IR ANGIO INTRA EXTRACRAN SEL COM CAROTID INNOMINATE UNI L MOD SED  12/14/2017  . IR ANGIO VERTEBRAL SEL VERTEBRAL BILAT MOD SED  09/15/2017  . IR RADIOLOGIST EVAL & MGMT  09/10/2017  . IR RADIOLOGIST EVAL & MGMT  10/19/2017  . lymphatic mass surgery    . lymphoma mass surgery Bilateral    non cancerous  . NASAL TURBINATE REDUCTION    . RADIOLOGY WITH ANESTHESIA N/A 12/14/2017   Procedure: RADIOLOGY WITH ANESTHESIA EMBOLIZATION;  Surgeon: Luanne Bras, MD;  Location: [facility];  Service: Radiology;  Laterality: N/A;  . VIDEO BRONCHOSCOPY Bilateral 11/18/2017   Procedure: VIDEO BRONCHOSCOPY WITHOUT FLUORO;  Surgeon: Collene Gobble, MD;  Location: [facility];  Service: Cardiopulmonary;  Laterality: Bilateral;     Current Outpatient Medications on File Prior to Visit  Medication Sig Dispense Refill  . acetaminophen (TYLENOL) 500 MG tablet Take 500-1,000 mg by mouth every 6 (six) hours as needed for headache (pain).     Marland Kitchen amLODipine (NORVASC) 5 MG tablet Take 1 tablet (5 mg total) by mouth daily. 90 tablet 1  . aspirin EC 81 MG tablet Take 81 mg by mouth every evening.    Marland Kitchen azelastine (ASTELIN) 0.1 % nasal spray Place 2 sprays into both nostrils 2 (two) times daily. 30 mL 5  . Blood Glucose Monitoring Suppl (ACCU-CHEK GUIDE ME) w/Device KIT 1 kit by Does not apply route daily. E11.21 1 kit 0  .  carvedilol (COREG) 3.125 MG tablet Take 1 tablet (3.125 mg total) by mouth 2 (two) times daily with a meal. 180 tablet 1  . glipiZIDE (GLUCOTROL) 10 MG tablet Take 1 tablet (10 mg total) by mouth 2 (two) times daily before a meal. 180 tablet 1  . glucose blood (ACCU-CHEK GUIDE) test strip Use as instructed to check blood sugar once daily. E11.21 100 each 11  . hydrocortisone 1 % ointment Apply 1 application topically 2 (two) times daily. 30 g 1  . isosorbide mononitrate (IMDUR) 60 MG 24 hr tablet Take 1 tablet (60 mg  total) by mouth daily. 90 tablet 1  . Lancets MISC Use as instructed to check blood sugar daily. E11.21 100 each 11  . rosuvastatin (CRESTOR) 10 MG tablet Take 1 tablet (10 mg total) by mouth daily. 90 tablet 3  . sitaGLIPtin (JANUVIA) 25 MG tablet Take 1 tablet (25 mg total) by mouth daily. 90 tablet 1  . sodium chloride (OCEAN) 0.65 % SOLN nasal spray Place 2 sprays into both nostrils as needed for congestion. 30 mL 3  . verapamil (CALAN) 80 MG tablet Take 1 tablet (80 mg total) by mouth 2 (two) times daily. 180 tablet 1   No current facility-administered medications on file prior to visit.     Allergies  Allergen Reactions  . Dust Mite Extract Cough  . Ace Inhibitors Cough  . Lovastatin Other (See Comments)    Generalized body pain     Social History   Occupational History  . Occupation: Unemployed  Tobacco Use  . Smoking status: Never Smoker  . Smokeless tobacco: Never Used  Substance and Sexual Activity  . Alcohol use: No  . Drug use: No  . Sexual activity: Not Currently    Birth control/protection: None     Family History  Problem Relation Age of Onset  . Hypertension Mother   . Asthma Sister   . Colon cancer Neg Hx   . Allergic rhinitis Neg Hx   . Eczema Neg Hx      Immunization History  Administered Date(s) Administered  . Influenza Split 05/13/2012, 04/12/2015  . Influenza Whole 07/12/2011  . Influenza,inj,Quad PF,6+ Mos 05/06/2013, 04/28/2016, 05/07/2017, 06/15/2018, 05/10/2019  . Pneumococcal Conjugate-13 05/10/2019  . Pneumococcal Polysaccharide-23 10/11/2011     Objective: There were no vitals filed for this visit.  Vascular Examination:  Capillary fill time to digits <3s b/l, faintly palpable DP pulses b/l, faintly palpable PT pulses b/l, pedal hair absent b/l and skin temperature gradient within normal limits b/l  Dermatological Examination: Pedal skin with normal turgor, texture and tone bilaterally, no open wounds bilaterally, no interdigital  macerations bilaterally and toenails 1-5 b/l elongated, dystrophic, thickened, crumbly with subungual debris  Musculoskeletal: Normal muscle strength 5/5 to all lower extremity muscle groups bilaterally, no pain crepitus or joint limitation noted with ROM b/l and bunion deformity noted b/l  Neurological: Protective sensation intact 5/5 intact bilaterally with 10g monofilament b/l, vibratory sensation intact b/l and proprioception intact bilaterally  Assessment: 1. Encounter for diabetic foot exam ([place])   2. Pain due to onychomycosis of toenails of both feet   3. Type 2 diabetes mellitus without complication, without long-term current use of insulin ([place])     Plan: -Discussed diabetic foot care principles. Literature dispensed on today. -Toenails 1-5 b/l were debrided in length and girth without iatrogenic bleeding. -Patient to continue soft, supportive shoe gear daily. -Patient to report any pedal injuries to medical professional immediately. -Patient/POA to call should  there be question/concern in the interim.  Return in about 3 months (around 12/01/2019) for diabetic nail trim.

## 2019-09-12 ENCOUNTER — Other Ambulatory Visit: Payer: Self-pay

## 2019-09-12 ENCOUNTER — Ambulatory Visit: Payer: Medicaid Other | Attending: Family Medicine | Admitting: Family Medicine

## 2019-09-12 VITALS — BP 160/80 | HR 73 | Temp 98.2°F | Resp 18 | Ht 62.0 in | Wt 157.0 lb

## 2019-09-12 DIAGNOSIS — E785 Hyperlipidemia, unspecified: Secondary | ICD-10-CM | POA: Insufficient documentation

## 2019-09-12 DIAGNOSIS — Z8249 Family history of ischemic heart disease and other diseases of the circulatory system: Secondary | ICD-10-CM | POA: Insufficient documentation

## 2019-09-12 DIAGNOSIS — Z79899 Other long term (current) drug therapy: Secondary | ICD-10-CM | POA: Diagnosis not present

## 2019-09-12 DIAGNOSIS — Z1231 Encounter for screening mammogram for malignant neoplasm of breast: Secondary | ICD-10-CM | POA: Diagnosis not present

## 2019-09-12 DIAGNOSIS — E1122 Type 2 diabetes mellitus with diabetic chronic kidney disease: Secondary | ICD-10-CM | POA: Diagnosis present

## 2019-09-12 DIAGNOSIS — J45909 Unspecified asthma, uncomplicated: Secondary | ICD-10-CM | POA: Insufficient documentation

## 2019-09-12 DIAGNOSIS — I10 Essential (primary) hypertension: Secondary | ICD-10-CM

## 2019-09-12 DIAGNOSIS — K219 Gastro-esophageal reflux disease without esophagitis: Secondary | ICD-10-CM | POA: Diagnosis not present

## 2019-09-12 DIAGNOSIS — Z8719 Personal history of other diseases of the digestive system: Secondary | ICD-10-CM | POA: Diagnosis not present

## 2019-09-12 DIAGNOSIS — I440 Atrioventricular block, first degree: Secondary | ICD-10-CM | POA: Diagnosis not present

## 2019-09-12 DIAGNOSIS — I130 Hypertensive heart and chronic kidney disease with heart failure and stage 1 through stage 4 chronic kidney disease, or unspecified chronic kidney disease: Secondary | ICD-10-CM | POA: Insufficient documentation

## 2019-09-12 DIAGNOSIS — I5032 Chronic diastolic (congestive) heart failure: Secondary | ICD-10-CM | POA: Diagnosis not present

## 2019-09-12 DIAGNOSIS — E1140 Type 2 diabetes mellitus with diabetic neuropathy, unspecified: Secondary | ICD-10-CM | POA: Diagnosis not present

## 2019-09-12 DIAGNOSIS — Z7982 Long term (current) use of aspirin: Secondary | ICD-10-CM | POA: Diagnosis not present

## 2019-09-12 DIAGNOSIS — N183 Chronic kidney disease, stage 3 unspecified: Secondary | ICD-10-CM | POA: Diagnosis not present

## 2019-09-12 DIAGNOSIS — R0789 Other chest pain: Secondary | ICD-10-CM | POA: Diagnosis not present

## 2019-09-12 DIAGNOSIS — Z7984 Long term (current) use of oral hypoglycemic drugs: Secondary | ICD-10-CM | POA: Insufficient documentation

## 2019-09-12 LAB — GLUCOSE, POCT (MANUAL RESULT ENTRY): POC Glucose: 116 mg/dl — AB (ref 70–99)

## 2019-09-12 MED ORDER — SITAGLIPTIN PHOSPHATE 25 MG PO TABS: 25.0000 mg | ORAL_TABLET | Freq: Every day | ORAL | 1 refills | Status: DC

## 2019-09-12 MED ORDER — AMLODIPINE BESYLATE 5 MG PO TABS: 5.0000 mg | ORAL_TABLET | Freq: Every day | ORAL | 1 refills | Status: DC

## 2019-09-12 MED ORDER — VERAPAMIL HCL 80 MG PO TABS: 80.0000 mg | ORAL_TABLET | Freq: Two times a day (BID) | ORAL | 1 refills | Status: DC

## 2019-09-12 MED ORDER — CARVEDILOL 3.125 MG PO TABS: 3.1250 mg | ORAL_TABLET | Freq: Two times a day (BID) | ORAL | 1 refills | Status: DC

## 2019-09-12 MED ORDER — ROSUVASTATIN CALCIUM 10 MG PO TABS: 10.0000 mg | ORAL_TABLET | Freq: Every day | ORAL | 1 refills | Status: DC

## 2019-09-12 MED ORDER — ISOSORBIDE MONONITRATE ER 60 MG PO TB24: 60.0000 mg | ORAL_TABLET | Freq: Every day | ORAL | 1 refills | Status: DC

## 2019-09-12 MED ORDER — GLIPIZIDE 10 MG PO TABS: 10.0000 mg | ORAL_TABLET | Freq: Two times a day (BID) | ORAL | 1 refills | Status: DC

## 2019-09-12 NOTE — Patient Instructions (Signed)
Text COVID to [phone] for your COVID-19 test

## 2019-09-12 NOTE — Progress Notes (Signed)
Subjective:  Patient ID: Paula [name], female    DOB: [birth year]  Age: 74 y.o. 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  She says her ENT will not see her and is requesting a referral ot a Dentist for a growth in her pharynx.  Her chronic cough is still present but not as severe. She complains of "pain in her heart " which is described as left inframammary  Pain which is intermittent, sharp ,lasts for 1 minute then resolves. Occurs at rest and with exertion but is absent at this time.  She is not up-to-date on annual mammograms. [place] and needs a COVID test 10/03/19. She will be in [name] for 6 months and will need refill on all her chronic medications.  Her blood pressure is elevated and she endorses taking her antihypertensives.  She had chronic disease management visit last month at which time her blood pressure was controlled at 138/70.  Past Medical History:  Diagnosis Date   Allergic rhinitis    Arthritis    Asthma    Brain aneurysm    Chronic kidney disease    Stage 3   Cough    Diabetes mellitus    Diastolic CHF, chronic ([place]) 10/11/2011   GERD (gastroesophageal reflux disease)    History of colon polyps 2012   tubular adenoma    Hyperlipidemia    Hypertension    Neuropathy 10/11/2011   PONV (postoperative nausea and vomiting)    one time after lymph node surgery    Past Surgical History:  Procedure Laterality Date   CATARACT EXTRACTION     right eye   IR 3D INDEPENDENT WKST  12/14/2017   IR ANGIO INTRA EXTRACRAN SEL COM CAROTID INNOMINATE BILAT MOD SED  09/15/2017   IR ANGIO INTRA EXTRACRAN SEL COM CAROTID INNOMINATE UNI L MOD SED  12/14/2017   IR ANGIO VERTEBRAL SEL VERTEBRAL BILAT MOD SED  09/15/2017   IR RADIOLOGIST EVAL & MGMT  09/10/2017   IR RADIOLOGIST EVAL & MGMT  10/19/2017   lymphatic mass surgery     lymphoma mass surgery Bilateral    non cancerous   NASAL TURBINATE REDUCTION     RADIOLOGY WITH ANESTHESIA N/A 12/14/2017   Procedure: RADIOLOGY WITH ANESTHESIA EMBOLIZATION;  Surgeon: Luanne Bras, MD;  Location: [facility];  Service: Radiology;  Laterality: N/A;   VIDEO BRONCHOSCOPY Bilateral 11/18/2017   Procedure: VIDEO BRONCHOSCOPY WITHOUT FLUORO;  Surgeon: Collene Gobble, MD;  Location: [facility];  Service: Cardiopulmonary;  Laterality: Bilateral;    Family History  Problem Relation Age of Onset   Hypertension Mother    Asthma Sister    Colon cancer Neg Hx    Allergic rhinitis Neg Hx    Eczema Neg Hx     Allergies  Allergen Reactions   Dust Mite Extract Cough   Ace Inhibitors Cough   Lovastatin Other (See Comments)    Generalized body pain    Outpatient Medications Prior to Visit  Medication Sig Dispense Refill  acetaminophen (TYLENOL) 500 MG tablet Take 500-1,000 mg by mouth every 6 (six) hours as needed for headache (pain).      amLODipine (NORVASC) 5 MG tablet Take 1 tablet (5 mg total) by mouth daily. 90 tablet 1   aspirin EC 81 MG tablet Take 81 mg by mouth every evening.     azelastine (ASTELIN) 0.1 % nasal spray Place 2 sprays into both nostrils 2 (two) times daily. 30 mL 5   Blood Glucose Monitoring Suppl (ACCU-CHEK GUIDE ME) w/Device KIT 1 kit by Does not apply route daily. E11.21 1 kit 0   carvedilol (COREG) 3.125 MG tablet Take 1 tablet (3.125 mg total) by mouth 2 (two) times daily with a meal. 180 tablet 1   glipiZIDE (GLUCOTROL) 10 MG tablet Take 1 tablet (10 mg  total) by mouth 2 (two) times daily before a meal. 180 tablet 1   glucose blood (ACCU-CHEK GUIDE) test strip Use as instructed to check blood sugar once daily. E11.21 100 each 11   hydrocortisone 1 % ointment Apply 1 application topically 2 (two) times daily. 30 g 1   isosorbide mononitrate (IMDUR) 60 MG 24 hr tablet Take 1 tablet (60 mg total) by mouth daily. 90 tablet 1   Lancets MISC Use as instructed to check blood sugar daily. E11.21 100 each 11   rosuvastatin (CRESTOR) 10 MG tablet Take 1 tablet (10 mg total) by mouth daily. 90 tablet 3   sitaGLIPtin (JANUVIA) 25 MG tablet Take 1 tablet (25 mg total) by mouth daily. 90 tablet 1   sodium chloride (OCEAN) 0.65 % SOLN nasal spray Place 2 sprays into both nostrils as needed for congestion. 30 mL 3   verapamil (CALAN) 80 MG tablet Take 1 tablet (80 mg total) by mouth 2 (two) times daily. 180 tablet 1   No facility-administered medications prior to visit.     ROS Review of Systems  Constitutional: Negative for activity change, appetite change and fatigue.  HENT: Negative for congestion, sinus pressure and sore throat.   Eyes: Negative for visual disturbance.  Respiratory: Positive for cough and chest tightness. Negative for shortness of breath and wheezing.   Cardiovascular: Negative for chest pain and palpitations.  Gastrointestinal: Negative for abdominal distention, abdominal pain and constipation.  Endocrine: Negative for polydipsia.  Genitourinary: Negative for dysuria and frequency.  Musculoskeletal: Negative for arthralgias and back pain.  Skin: Negative for rash.  Neurological: Negative for tremors, light-headedness and numbness.  Hematological: Does not bruise/bleed easily.  Psychiatric/Behavioral: Negative for agitation and behavioral problems.    Objective:  BP (!) 163/80 (BP Location: Left Arm, Patient Position: Sitting, Cuff Size: Normal)    Pulse 73    Temp 98.2 F (36.8 C) (Oral)    Resp 18    Ht 5' 2" (1.575  m)    Wt 157 lb (71.2 kg)    LMP  (LMP Unknown)    SpO2 98%    BMI 28.72 kg/m   BP/Weight 09/12/2019 08/02/2019 4/69/6295  Systolic BP 284 132 440  Diastolic BP 80 70 78  Wt. (Lbs) 157 153 152  BMI 28.72 27.98 27.8      Physical Exam Constitutional:      Appearance: She is well-developed.  HENT:     Mouth/Throat:     Comments: No masses noted in oral cavity oropharynx Neck:     Vascular: No JVD.  Cardiovascular:     Rate and Rhythm: Normal rate.     Heart sounds: Normal heart sounds.  No murmur.     Comments: No reproducible chest pain Pulmonary:     Effort: Pulmonary effort is normal.     Breath sounds: Normal breath sounds. No wheezing or rales.  Chest:     Chest wall: No tenderness.  Abdominal:     General: Bowel sounds are normal. There is no distension.     Palpations: Abdomen is soft. There is no mass.     Tenderness: There is no abdominal tenderness.  Musculoskeletal:        General: Normal range of motion.     Right lower leg: No edema.     Left lower leg: No edema.  Neurological:     Mental Status: She is alert and oriented to person, place, and time.  Psychiatric:        Mood and Affect: Mood normal.     CMP Latest Ref Rng & Units 03/26/2019 02/25/2019 02/03/2019  Glucose 70 - 99 mg/dL 200(H) 135(H) 114(H)  BUN 8 - 23 mg/dL _0 Creatinine 0.44 - 1.00 mg/dL 1.73(H) 2.29(H) 1.86(H)  Sodium 135 - 145 mmol/L 135 135 128(L)  Potassium 3.5 - 5.1 mmol/L 3.5 4.3 4.3  Chloride 98 - 111 mmol/L 99 94(L) 90(L)  CO2 22 - 32 mmol/L _1 Calcium 8.9 - 10.3 mg/dL 8.8(L) 9.3 9.3  Total Protein 6.5 - 8.1 g/dL 6.3(L) - 6.7  Total Bilirubin 0.3 - 1.2 mg/dL 0.5 - 0.3  Alkaline Phos 38 - 126 U/L 77 - 76  AST 15 - 41 U/L 20 - 19  ALT 0 - 44 U/L 15 - 13    Lipid Panel     Component Value Date/Time   CHOL 139 10/22/2018 1041   TRIG 88 10/22/2018 1041   HDL 44 10/22/2018 1041   CHOLHDL 3.2 10/22/2018 1041   CHOLHDL 2.9 01/25/2016 0901   VLDL 20 01/25/2016 0901    LDLCALC 77 10/22/2018 1041    CBC    Component Value Date/Time   WBC 5.0 03/26/2019 1648   RBC 4.19 03/26/2019 1648   HGB 11.8 (L) 03/26/2019 1648   HGB 12.4 09/02/2018 1211   HCT 34.7 (L) 03/26/2019 1648   HCT 37.3 09/02/2018 1211   PLT 186 03/26/2019 1648   PLT 203 09/02/2018 1211   MCV 82.8 03/26/2019 1648   MCV 87 09/02/2018 1211   MCH 28.2 03/26/2019 1648   MCHC 34.0 03/26/2019 1648   RDW 12.8 03/26/2019 1648   RDW 13.1 09/02/2018 1211   LYMPHSABS 0.8 03/26/2019 1648   LYMPHSABS 1.2 09/02/2018 1211   MONOABS 0.4 03/26/2019 1648   EOSABS 0.1 03/26/2019 1648   EOSABS 0.1 09/02/2018 1211   BASOSABS 0.0 03/26/2019 1648   BASOSABS 0.0 09/02/2018 1211    Lab Results  Component Value Date   HGBA1C 7.0 08/02/2019    Assessment & Plan:  .  1. Encounter for screening mammogram for malignant neoplasm of breast - MM 3D SCREEN BREAST BILATERAL; Future  2. Essential hypertension Uncontrolled and she endorses compliance with her medications Blood pressure at her last visit was controlled hence I will make no regimen changes today Counseled on blood pressure goal of less than 130/80, low-sodium, DASH diet, medication compliance, 150 minutes of moderate intensity exercise per week. Discussed medication compliance, adverse effects. - amLODipine (NORVASC) 5 MG tablet; Take 1 tablet (5 mg total) by mouth daily.  Dispense: 90 tablet; Refill: 1 - carvedilol (COREG) 3.125 MG tablet; Take 1 tablet (3.125 mg total) by  mouth 2 (two) times daily with a meal.  Dispense: 180 tablet; Refill: 1 - isosorbide mononitrate (IMDUR) 60 MG 24 hr tablet; Take 1 tablet (60 mg total) by mouth daily.  Dispense: 90 tablet; Refill: 1 - verapamil (CALAN) 80 MG tablet; Take 1 tablet (80 mg total) by mouth 2 (two) times daily.  Dispense: 180 tablet; Refill: 1  3. Type 2 diabetes mellitus with stage 3 chronic kidney disease, without long-term current use of insulin (HCC) Controlled with A1c of 7.0; goal  is less than 7.0 Continue current regimen Counseled on Diabetic diet, my plate method, 809 minutes of moderate intensity exercise/week Blood sugar logs with fasting goals of 80-120 mg/dl, random of less than 180 and in the event of sugars less than 60 mg/dl or greater than 400 mg/dl encouraged to notify the clinic. Advised on the need for annual eye exams, annual foot exams, Pneumonia vaccine. - Ambulatory referral to Dentistry - glipiZIDE (GLUCOTROL) 10 MG tablet; Take 1 tablet (10 mg total) by mouth 2 (two) times daily before a meal.  Dispense: 180 tablet; Refill: 1 - rosuvastatin (CRESTOR) 10 MG tablet; Take 1 tablet (10 mg total) by mouth daily.  Dispense: 90 tablet; Refill: 1 - sitaGLIPtin (JANUVIA) 25 MG tablet; Take 1 tablet (25 mg total) by mouth daily.  Dispense: 90 tablet; Refill: 1 - CMP14+EGFR; Future - Microalbumin / creatinine urine ratio; Future - Lipid panel; Future - Glucose (CBG)  4. 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   I am unable to detect the presence of a mass in her oral cavity but advised her that if she does feel a mass this will need to be evaluated by ENT via laryngoscopy but she states she is unable to see ENT.  She now requests dental referral for cleaning and I have placed that referral. She has a housing form which I have completed for her today.    Charlott Rakes, MD, FAAFP. [facility], [place]   09/12/2019, 3:59 PM

## 2019-09-13 ENCOUNTER — Encounter: Payer: Self-pay | Admitting: Family Medicine

## 2019-09-13 ENCOUNTER — Other Ambulatory Visit: Payer: Medicaid Other

## 2019-09-13 MED FILL — ACCU-CHEK GUIDE TEST STRIP: 100 days supply | Qty: 100 | Fill #1

## 2019-09-13 MED FILL — VENTOLIN HFA 90 MCG INHALER: 108 (90 BAS | 75 days supply | Qty: 54 | Fill #1

## 2019-09-13 MED FILL — ISOSORBIDE MN ER 60 MG TAB: 60 | 90 days supply | Qty: 90 | Fill #0

## 2019-09-13 MED FILL — JANUVIA 25 MG TABLET: 25 | 90 days supply | Qty: 90 | Fill #0

## 2019-09-13 MED FILL — AMLODIPINE BESYLATE 5 MG TA: 5 | 90 days supply | Qty: 90 | Fill #0

## 2019-09-13 MED FILL — AZELASTINE HCL 137 MCG SPRY: 0.1 | 90 days supply | Qty: 90 | Fill #1

## 2019-09-13 MED FILL — CARVEDILOL 3.125 MG TABLET: 3.125 | 90 days supply | Qty: 180 | Fill #0

## 2019-09-13 MED FILL — ROSUVASTATIN CALCIUM 10 MG: 10 | 90 days supply | Qty: 90 | Fill #0

## 2019-09-13 MED FILL — VERAPAMIL 80 MG TABLET: 80 | 90 days supply | Qty: 180 | Fill #0

## 2019-09-13 MED FILL — glipiZIDE 10 MG TABS: 10 | 90 days supply | Qty: 180 | Fill #0

## 2019-09-14 ENCOUNTER — Other Ambulatory Visit: Payer: Self-pay

## 2019-09-14 ENCOUNTER — Ambulatory Visit: Payer: Medicaid Other | Attending: Family Medicine

## 2019-09-14 DIAGNOSIS — E1122 Type 2 diabetes mellitus with diabetic chronic kidney disease: Secondary | ICD-10-CM

## 2019-09-14 DIAGNOSIS — N183 Chronic kidney disease, stage 3 unspecified: Secondary | ICD-10-CM

## 2019-09-15 ENCOUNTER — Telehealth: Payer: Self-pay

## 2019-09-15 LAB — LIPID PANEL
Chol/HDL Ratio: 2.9 ratio (ref 0.0–4.4)
Cholesterol, Total: 160 mg/dL (ref 100–199)
HDL: 55 mg/dL (ref >39)
LDL Chol Calc (NIH): 87 mg/dL (ref 0–99)
Triglycerides: 96 mg/dL (ref 0–149)
VLDL Cholesterol Cal: 18 mg/dL (ref 5–40)

## 2019-09-15 LAB — CMP14+EGFR
ALT: 18 IU/L (ref 0–32)
AST: 20 IU/L (ref 0–40)
Albumin/Globulin Ratio: 1.5 (ref 1.2–2.2)
Albumin: 4 g/dL (ref 3.7–4.7)
Alkaline Phosphatase: 79 IU/L (ref 39–117)
BUN/Creatinine Ratio: 12 (ref 12–28)
BUN: 29 mg/dL — ABNORMAL HIGH (ref 8–27)
Bilirubin Total: 0.4 mg/dL (ref 0.0–1.2)
CO2: 27 mmol/L (ref 20–29)
Calcium: 9.4 mg/dL (ref 8.7–10.3)
Chloride: 96 mmol/L (ref 96–106)
Creatinine, Ser: 2.47 mg/dL — ABNORMAL HIGH (ref 0.57–1.00)
GFR calc Af Amer: 22 mL/min/{1.73_m2} — ABNORMAL LOW (ref >59)
GFR calc non Af Amer: 19 mL/min/{1.73_m2} — ABNORMAL LOW (ref >59)
Globulin, Total: 2.7 g/dL (ref 1.5–4.5)
Glucose: 115 mg/dL — ABNORMAL HIGH (ref 65–99)
Potassium: 4.5 mmol/L (ref 3.5–5.2)
Sodium: 137 mmol/L (ref 134–144)
Total Protein: 6.7 g/dL (ref 6.0–8.5)

## 2019-09-15 LAB — MICROALBUMIN / CREATININE URINE RATIO
Creatinine, Urine: 45 mg/dL
Microalb/Creat Ratio: 2873 mg/g creat — ABNORMAL HIGH (ref 0–29)
Microalbumin, Urine: 1292.7 ug/mL

## 2019-09-15 NOTE — Telephone Encounter (Signed)
-----   Message from Charlott Rakes, MD sent at 09/15/2019  1:03 PM EST ----- Please inform her cholesterol is normal but kidney function is abnormal and has slightly worsened compared to last set of labs 5 months ago.  She will need to follow-up with her nephrologist regarding this.

## 2019-09-15 NOTE — Telephone Encounter (Signed)
Patient name and DOB has been verified Patient was informed of lab results. Patient had no questions.  

## 2019-09-16 ENCOUNTER — Telehealth: Payer: Self-pay | Admitting: Family Medicine

## 2019-09-16 DIAGNOSIS — N1832 Chronic kidney disease, stage 3b: Secondary | ICD-10-CM

## 2019-09-16 NOTE — Telephone Encounter (Signed)
Patient came into the office today requesting for a referral to be sent to her kidney doctor. Dr.Web. She called trying to make an appointment and they need a referral please fu at your earliest convenience. [email or web address]

## 2019-09-16 NOTE — Telephone Encounter (Signed)
Disregard being sent to [employer].

## 2019-09-16 NOTE — Telephone Encounter (Signed)
Done

## 2019-09-16 NOTE — Telephone Encounter (Signed)
Patient is needing new referral placed for her Kidney doctor.

## 2019-09-20 ENCOUNTER — Emergency Department (HOSPITAL_COMMUNITY)
Admission: EM | Admit: 2019-09-20 | Discharge: 2019-09-20 | Disposition: A | Discharge status: Home / Self Care | Payer: Medicaid Other | Attending: Emergency Medicine | Admitting: Emergency Medicine

## 2019-09-20 ENCOUNTER — Other Ambulatory Visit: Payer: Self-pay

## 2019-09-20 ENCOUNTER — Telehealth: Payer: Self-pay | Admitting: Family Medicine

## 2019-09-20 ENCOUNTER — Encounter (HOSPITAL_COMMUNITY): Payer: Self-pay | Admitting: Emergency Medicine

## 2019-09-20 DIAGNOSIS — N183 Chronic kidney disease, stage 3 unspecified: Secondary | ICD-10-CM | POA: Diagnosis not present

## 2019-09-20 DIAGNOSIS — J3089 Other allergic rhinitis: Secondary | ICD-10-CM

## 2019-09-20 DIAGNOSIS — Z7984 Long term (current) use of oral hypoglycemic drugs: Secondary | ICD-10-CM | POA: Diagnosis not present

## 2019-09-20 DIAGNOSIS — J45909 Unspecified asthma, uncomplicated: Secondary | ICD-10-CM | POA: Diagnosis not present

## 2019-09-20 DIAGNOSIS — I130 Hypertensive heart and chronic kidney disease with heart failure and stage 1 through stage 4 chronic kidney disease, or unspecified chronic kidney disease: Secondary | ICD-10-CM | POA: Insufficient documentation

## 2019-09-20 DIAGNOSIS — E119 Type 2 diabetes mellitus without complications: Secondary | ICD-10-CM | POA: Diagnosis not present

## 2019-09-20 DIAGNOSIS — R053 Chronic cough: Secondary | ICD-10-CM

## 2019-09-20 DIAGNOSIS — I5030 Unspecified diastolic (congestive) heart failure: Secondary | ICD-10-CM | POA: Diagnosis not present

## 2019-09-20 DIAGNOSIS — Z7982 Long term (current) use of aspirin: Secondary | ICD-10-CM | POA: Diagnosis not present

## 2019-09-20 DIAGNOSIS — R05 Cough: Secondary | ICD-10-CM | POA: Diagnosis not present

## 2019-09-20 DIAGNOSIS — J302 Other seasonal allergic rhinitis: Secondary | ICD-10-CM

## 2019-09-20 DIAGNOSIS — Z79899 Other long term (current) drug therapy: Secondary | ICD-10-CM | POA: Insufficient documentation

## 2019-09-20 DIAGNOSIS — R058 Other specified cough: Secondary | ICD-10-CM

## 2019-09-20 NOTE — Telephone Encounter (Signed)
Referral has been placed. 

## 2019-09-20 NOTE — Telephone Encounter (Signed)
Pt called to inform PCP she went to the ER today as advised and they recommended a referral to ENT. Her preferred location is  -[facility] [address] [place], [place] [address] [phone]

## 2019-09-20 NOTE — Discharge Instructions (Signed)
I do not see any obvious masses in your throat or behind your tongue today.  Please follow-up with ENT for further evaluation of this chronic cough and concern for growth in your throat.  You can try's epical throat lozenges to help with your cough.  Continue following up with you primary care doctor.

## 2019-09-20 NOTE — ED Provider Notes (Signed)
[facility] DEPT Provider Note   CSN: [id number] Arrival date & time: 09/20/19  1116     History Chief Complaint  Patient presents with  . Cough  . Headache    Paula [name] is a 74 y.o. female.  Paula [name] is a 74 y.o. female with a history of hypertension, hyperlipidemia, diabetes, CHF, CKD, who presents to the ED for evaluation of cough.  Patient states that she has had a chronic cough for 26 years.  Cough is dry and nonproductive.  She reports sometimes when she coughs she coughs so hard that she gives herself a headache, or leaks a small amount of urine.  She has been seen multiple times by her primary care doctor for this cough they cannot find what may be causing it.  She has been referred to specialist at [facility] in the past as well.  She states that 6 months ago she went to the dentist and she reports that they saw an "growth in the back of her throat" and she thinks this is likely the cause of her cough and came here today to have it evaluated.  She denies any change in her cough that caused her to present to the emergency department today, she has not been able to follow-up outpatient with an ENT specialist or other specialist regarding this, has only seen her primary care doctor.  She states she sometimes uses over-the-counter Delsym with temporary improvement.  No other aggravating or alleviating factors.  She states that sometimes when she coughs she gets a headache, but she does not have any headache currently, denies vision changes, neck pain or stiffness, numbness tingling or weakness.        Past Medical History:  Diagnosis Date  . Allergic rhinitis   . Arthritis   . Asthma   . Brain aneurysm   . Chronic kidney disease    Stage 3  . Cough   . Diabetes mellitus   . Diastolic CHF, chronic ([place]) 10/11/2011  . GERD (gastroesophageal reflux disease)   . History of colon polyps 2012   tubular adenoma   . Hyperlipidemia     . Hypertension   . Neuropathy 10/11/2011  . PONV (postoperative nausea and vomiting)    one time after lymph node surgery    Patient Active Problem List   Diagnosis Date Noted  . Upper airway cough syndrome 10/12/2017  . Chronic rhinitis 10/01/2017  . Seasonal allergic conjunctivitis 10/01/2017  . Aneurysm ([place]) 09/24/2017  . Acute metabolic encephalopathy 83/41/9622  . URI (upper respiratory infection) 09/02/2017  . Facial weakness 09/02/2017  . Benign essential HTN 09/02/2017  . Allergic rhinitis 08/20/2017  . Chronic pain 05/07/2017  . Palpitations 07/10/2016  . Insomnia 03/25/2016  . Gout 01/23/2016  . History of cataract 02/27/2015  . Seasonal and perennial allergic rhinitis 02/15/2012  . Cough 11/04/2011  . Diabetic neuropathy ([place]) 10/11/2011  . Chronic diastolic congestive heart failure ([place]) 10/11/2011  . CKD (chronic kidney disease) stage 3, GFR 30-59 ml/min (HCC) 10/10/2011  . GERD (gastroesophageal reflux disease)   . Type 2 diabetes mellitus without complication, without long-term current use of insulin ([place]) 07/03/2010  . Hyperlipidemia 07/03/2010  . Essential hypertension 07/03/2010    Past Surgical History:  Procedure Laterality Date  . CATARACT EXTRACTION     right eye  . IR 3D INDEPENDENT WKST  12/14/2017  . IR ANGIO INTRA EXTRACRAN SEL COM CAROTID INNOMINATE BILAT MOD SED  09/15/2017  .  IR ANGIO INTRA EXTRACRAN SEL COM CAROTID INNOMINATE UNI L MOD SED  12/14/2017  . IR ANGIO VERTEBRAL SEL VERTEBRAL BILAT MOD SED  09/15/2017  . IR RADIOLOGIST EVAL & MGMT  09/10/2017  . IR RADIOLOGIST EVAL & MGMT  10/19/2017  . lymphatic mass surgery    . lymphoma mass surgery Bilateral    non cancerous  . NASAL TURBINATE REDUCTION    . RADIOLOGY WITH ANESTHESIA N/A 12/14/2017   Procedure: RADIOLOGY WITH ANESTHESIA EMBOLIZATION;  Surgeon: Luanne Bras, MD;  Location: [facility];  Service: Radiology;  Laterality: N/A;  . VIDEO BRONCHOSCOPY Bilateral 11/18/2017   Procedure: VIDEO  BRONCHOSCOPY WITHOUT FLUORO;  Surgeon: Collene Gobble, MD;  Location: [facility];  Service: Cardiopulmonary;  Laterality: Bilateral;     OB History    Gravida  5   Para  4   Term  4   Preterm      AB  1   Living  4     SAB      TAB  1   Ectopic      Multiple      Live Births              Family History  Problem Relation Age of Onset  . Hypertension Mother   . Asthma Sister   . Colon cancer Neg Hx   . Allergic rhinitis Neg Hx   . Eczema Neg Hx     Social History   Tobacco Use  . Smoking status: Never Smoker  . Smokeless tobacco: Never Used  Substance Use Topics  . Alcohol use: No  . Drug use: No    Home Medications Prior to Admission medications   Medication Sig Start Date End Date Taking? Authorizing Provider  acetaminophen (TYLENOL) 500 MG tablet Take 500-1,000 mg by mouth every 6 (six) hours as needed for headache (pain).     [provider]  amLODipine (NORVASC) 5 MG tablet Take 1 tablet (5 mg total) by mouth daily. 09/12/19   Charlott Rakes, MD  aspirin EC 81 MG tablet Take 81 mg by mouth every evening.    [provider]  azelastine (ASTELIN) 0.1 % nasal spray Place 2 sprays into both nostrils 2 (two) times daily. 03/21/19   Martyn Ehrich, NP  Blood Glucose Monitoring Suppl (ACCU-CHEK GUIDE ME) w/Device KIT 1 kit by Does not apply route daily. E11.21 08/25/19   Charlott Rakes, MD  carvedilol (COREG) 3.125 MG tablet Take 1 tablet (3.125 mg total) by mouth 2 (two) times daily with a meal. 09/12/19   Charlott Rakes, MD  glipiZIDE (GLUCOTROL) 10 MG tablet Take 1 tablet (10 mg total) by mouth 2 (two) times daily before a meal. 09/12/19   Newlin, Enobong, MD  glucose blood (ACCU-CHEK GUIDE) test strip Use as instructed to check blood sugar once daily. E11.21 08/25/19   Charlott Rakes, MD  hydrocortisone 1 % ointment Apply 1 application topically 2 (two) times daily. 10/19/18   Charlott Rakes, MD  isosorbide mononitrate (IMDUR) 60 MG  24 hr tablet Take 1 tablet (60 mg total) by mouth daily. 09/12/19   Charlott Rakes, MD  Lancets MISC Use as instructed to check blood sugar daily. E11.21 08/25/19   Charlott Rakes, MD  rosuvastatin (CRESTOR) 10 MG tablet Take 1 tablet (10 mg total) by mouth daily. 09/12/19   Charlott Rakes, MD  sitaGLIPtin (JANUVIA) 25 MG tablet Take 1 tablet (25 mg total) by mouth daily. 09/12/19   Charlott Rakes, MD  sodium  chloride (OCEAN) 0.65 % SOLN nasal spray Place 2 sprays into both nostrils as needed for congestion. 03/21/19   Martyn Ehrich, NP  verapamil (CALAN) 80 MG tablet Take 1 tablet (80 mg total) by mouth 2 (two) times daily. 09/12/19   Charlott Rakes, MD    Allergies    Dust mite extract, Ace inhibitors, and Lovastatin  Review of Systems   Review of Systems  Constitutional: Negative for chills and fever.  HENT: Negative.  Negative for sore throat and trouble swallowing.   Respiratory: Positive for cough. Negative for shortness of breath.   Cardiovascular: Negative for chest pain.  Gastrointestinal: Negative for abdominal pain, nausea and vomiting.  Musculoskeletal: Negative for neck pain and neck stiffness.  Neurological: Positive for headaches. Negative for dizziness, syncope, weakness, light-headedness and numbness.    Physical Exam Updated Vital Signs BP 139/84   Pulse 81   Temp (!) 97.5 F (36.4 C) (Oral)   Resp 20   Ht 5' 2"  (1.575 m)   Wt 71.2 kg   LMP  (LMP Unknown)   SpO2 100%   BMI 28.72 kg/m   Physical Exam Vitals and nursing note reviewed.  Constitutional:      General: She is not in acute distress.    Appearance: She is well-developed and normal weight. She is not ill-appearing or diaphoretic.  HENT:     Head: Normocephalic and atraumatic.     Mouth/Throat:     Comments: Posterior oropharynx is clear and moist, no erythema, no gross or foreign body noted in the pharynx, uvula midline, no edema, tolerating secretions without difficulty, normal phonation Eyes:       General:        Right eye: No discharge.        Left eye: No discharge.  Pulmonary:     Effort: Pulmonary effort is normal. No respiratory distress.     Breath sounds: Normal breath sounds.     Comments: Respirations equal and unlabored, patient able to speak in full sentences, lungs clear to auscultation bilaterally, occasional dry cough during exam Abdominal:     General: Bowel sounds are normal. There is no distension.     Palpations: Abdomen is soft.     Tenderness: There is no abdominal tenderness.  Musculoskeletal:     Cervical back: Normal range of motion and neck supple. No rigidity.  Skin:    General: Skin is warm and dry.     Capillary Refill: Capillary refill takes less than 2 seconds.  Neurological:     Mental Status: She is alert.     GCS: GCS eye subscore is 4. GCS verbal subscore is 5. GCS motor subscore is 6.     Coordination: Coordination normal.     Comments: Speech is clear, able to follow commands CN III-XII intact Normal strength in upper and lower extremities bilaterally including dorsiflexion and plantar flexion, strong and equal grip strength Sensation normal to light and sharp touch Moves extremities without ataxia, coordination intact  Psychiatric:        Mood and Affect: Mood normal.        Behavior: Behavior normal.     ED Results / Procedures / Treatments   Labs (all labs ordered are listed, but only abnormal results are displayed) Labs Reviewed - No data to display  EKG None  Radiology No results found.  Procedures Procedures (including critical care time)  Medications Ordered in ED Medications - No data to display  ED Course  I  have reviewed the triage vital signs and the nursing notes.  Pertinent labs & imaging results that were available during my care of the patient were reviewed by me and considered in my medical decision making (see chart for details).    MDM Rules/Calculators/A&P                     74 year old female  presents with chronic cough that has been present for 26 years.  She states that this sometimes gives her headaches but she denies any headache today, has no neurologic deficits on exam, aside from mild hypertension her vitals are normal and she is well-appearing.  She states that 6 months ago while at the dentist they saw a possible growth in the back of her throat, she thinks that this may be causing her cough, she came in today for evaluation but denies any new change in her cough, she has no difficulty swallowing or pain in her throat and there is no visible growth or deformity in the throat on exam.  Her lungs are clear she has no associated chest pain.  Cough is not productive and she denies hemoptysis.  I recommend that the patient follow-up with the ENT but do not feel there is any further evaluation indicated of this chronic problem that has been present for many years.   At this time there does not appear to be any evidence of an acute emergency medical condition and the patient appears stable for discharge with appropriate outpatient follow up.Diagnosis was discussed with patient who verbalizes understanding and is agreeable to discharge.  Final Clinical Impression(s) / ED Diagnoses Final diagnoses:  Chronic cough    Rx / DC Orders ED Discharge Orders    None       Janet Berlin 09/24/19 1312    Tegeler, Gwenyth Allegra, MD 09/24/19 [phone]

## 2019-09-20 NOTE — ED Triage Notes (Signed)
Patient presents with complaints of chronic cough. She states that she found a growth in her throat 6 weeks ago she believes may be causing her cough. She also complains of headaches.

## 2019-09-29 ENCOUNTER — Other Ambulatory Visit: Payer: Medicaid Other

## 2019-10-03 ENCOUNTER — Ambulatory Visit: Payer: Medicaid Other | Attending: Internal Medicine

## 2019-10-03 DIAGNOSIS — Z20822 Contact with and (suspected) exposure to covid-19: Secondary | ICD-10-CM

## 2019-10-04 LAB — NOVEL CORONAVIRUS, NAA: SARS-CoV-2, NAA: NOT DETECTED

## 2019-10-25 ENCOUNTER — Ambulatory Visit: Payer: Medicaid Other

## 2019-11-02 ENCOUNTER — Ambulatory Visit: Payer: Medicaid Other | Attending: Family Medicine | Admitting: Family Medicine

## 2019-11-02 ENCOUNTER — Other Ambulatory Visit: Payer: Self-pay

## 2019-11-07 ENCOUNTER — Ambulatory Visit: Payer: Self-pay | Admitting: Family Medicine

## 2019-12-02 ENCOUNTER — Ambulatory Visit: Payer: Medicaid Other | Admitting: Podiatry

## 2020-02-22 MED FILL — glipiZIDE 10 MG TABS: 10 | 90 days supply | Qty: 180 | Fill #1

## 2020-02-22 MED FILL — VERAPAMIL 80 MG TABLET: 80 | 90 days supply | Qty: 180 | Fill #1

## 2020-02-22 MED FILL — ISOSORBIDE MN ER 60 MG TAB: 60 | 90 days supply | Qty: 90 | Fill #1

## 2020-02-22 MED FILL — AMLODIPINE BESYLATE 5 MG TA: 5 | 90 days supply | Qty: 90 | Fill #1

## 2020-02-22 MED FILL — CARVEDILOL 3.125 MG TABLET: 3.125 | 90 days supply | Qty: 180 | Fill #1

## 2020-02-22 MED FILL — ROSUVASTATIN CALCIUM 10 MG: 10 | 90 days supply | Qty: 90 | Fill #1

## 2020-02-24 ENCOUNTER — Other Ambulatory Visit: Payer: Self-pay | Admitting: Pharmacist

## 2020-02-24 DIAGNOSIS — N1832 Chronic kidney disease, stage 3b: Secondary | ICD-10-CM

## 2020-02-24 DIAGNOSIS — E1122 Type 2 diabetes mellitus with diabetic chronic kidney disease: Secondary | ICD-10-CM

## 2020-02-24 MED ORDER — TRUE METRIX METER W/DEVICE KIT: PACK | 0 refills | Status: DC

## 2020-02-24 MED ORDER — GLUCOSE BLOOD VI STRP: ORAL_STRIP | 2 refills | Status: DC

## 2020-02-24 MED ORDER — TRUEPLUS LANCETS 28G MISC: 2 refills | Status: DC

## 2020-02-24 MED FILL — ACCU-CHEK GUIDE TEST STRIP: 100 days supply | Qty: 100 | Fill #2

## 2020-02-24 MED FILL — ACCU-CHEK SOFTCLIX LANCETS: 100 days supply | Qty: 100 | Fill #1

## 2020-02-24 MED FILL — JANUVIA 25 MG TABLET: 25 | 90 days supply | Qty: 90 | Fill #1

## 2020-03-16 IMAGING — CR DG KNEE COMPLETE 4+V*R*
4 series · 4 of 4 positions shown · non-contrast
Comparison: None.

CLINICAL DATA: Fall, right knee pain

EXAM:
RIGHT KNEE - COMPLETE 4+ VIEW

[t knee ap right]
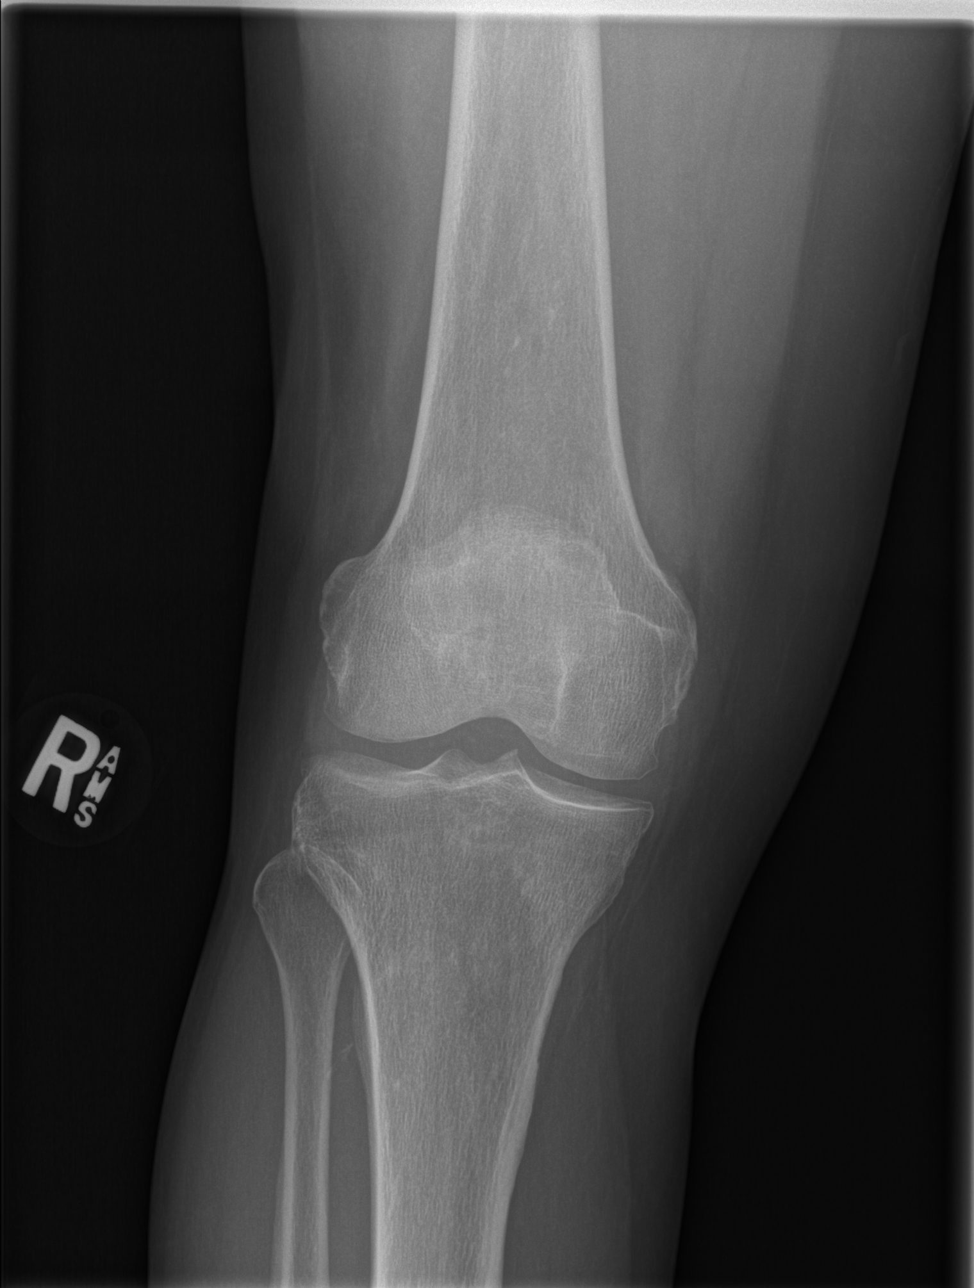

[t knee obl right (1 of 2)]
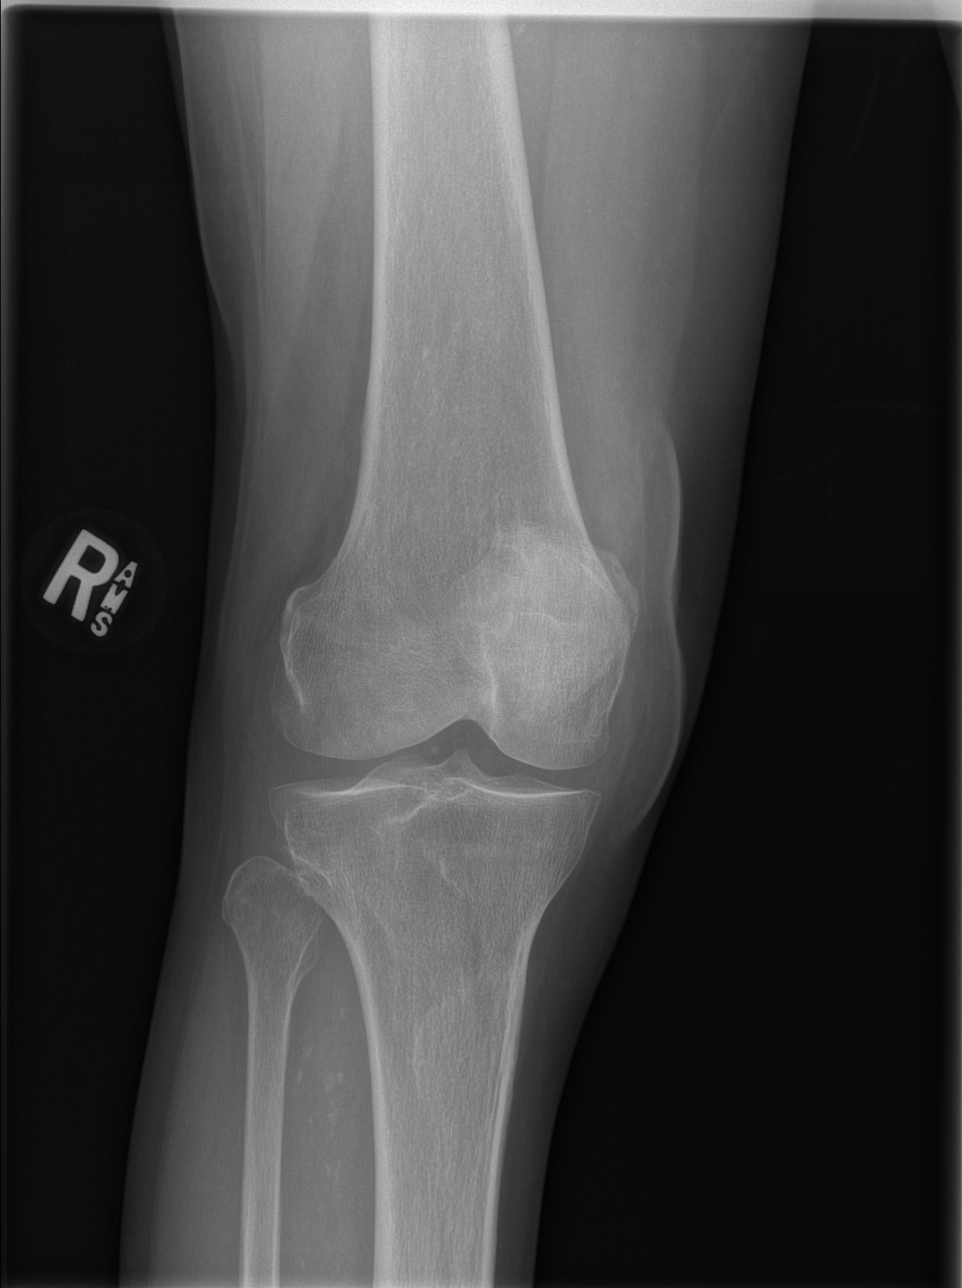

[t knee obl right (2 of 2)]
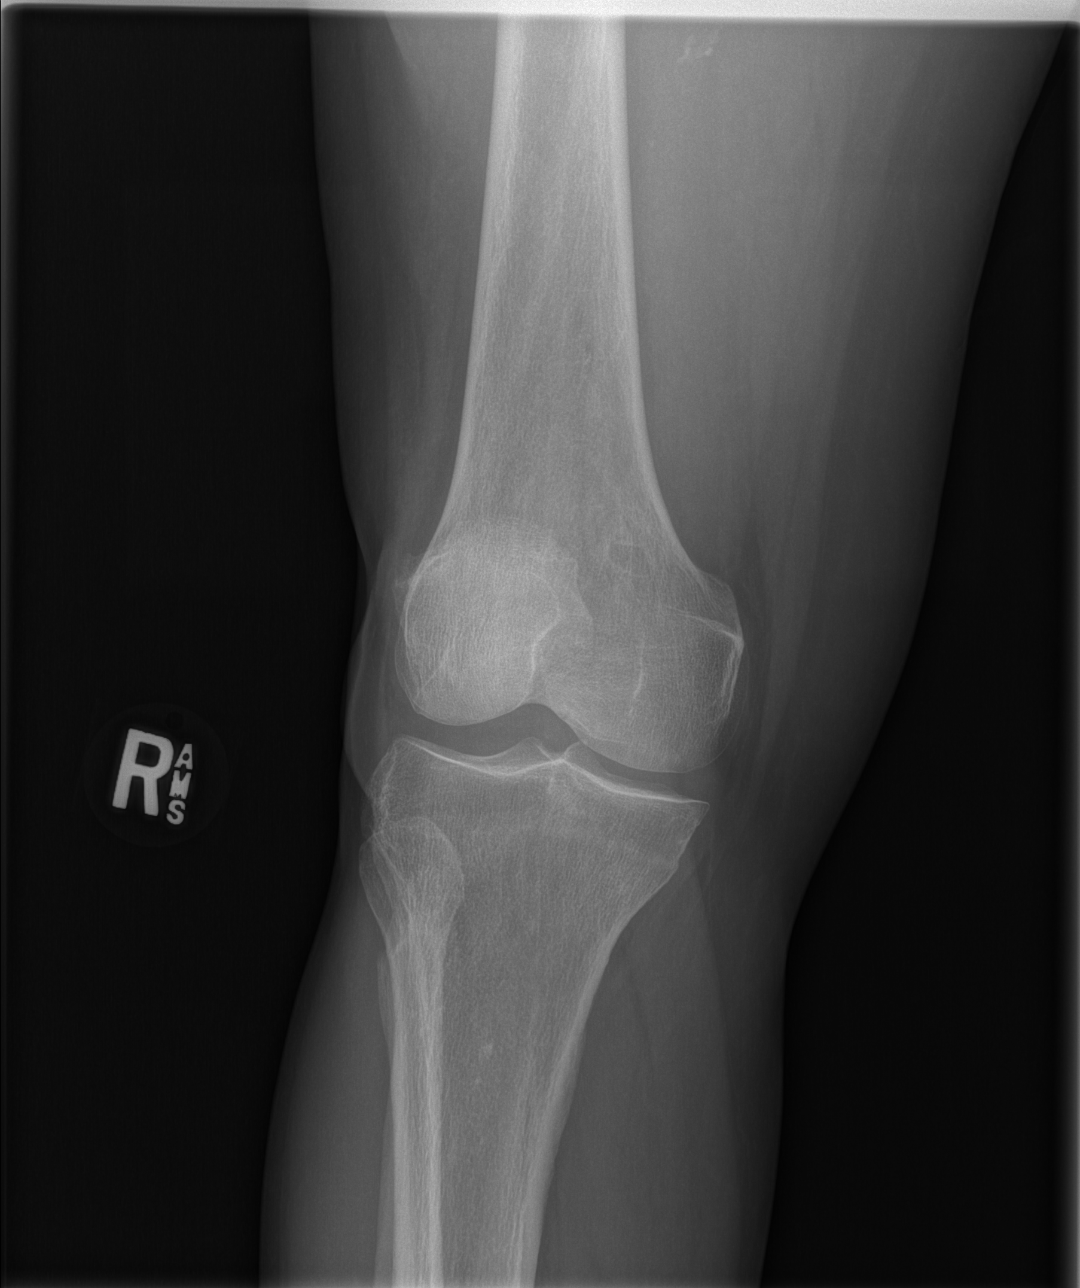

[t knee lat right]
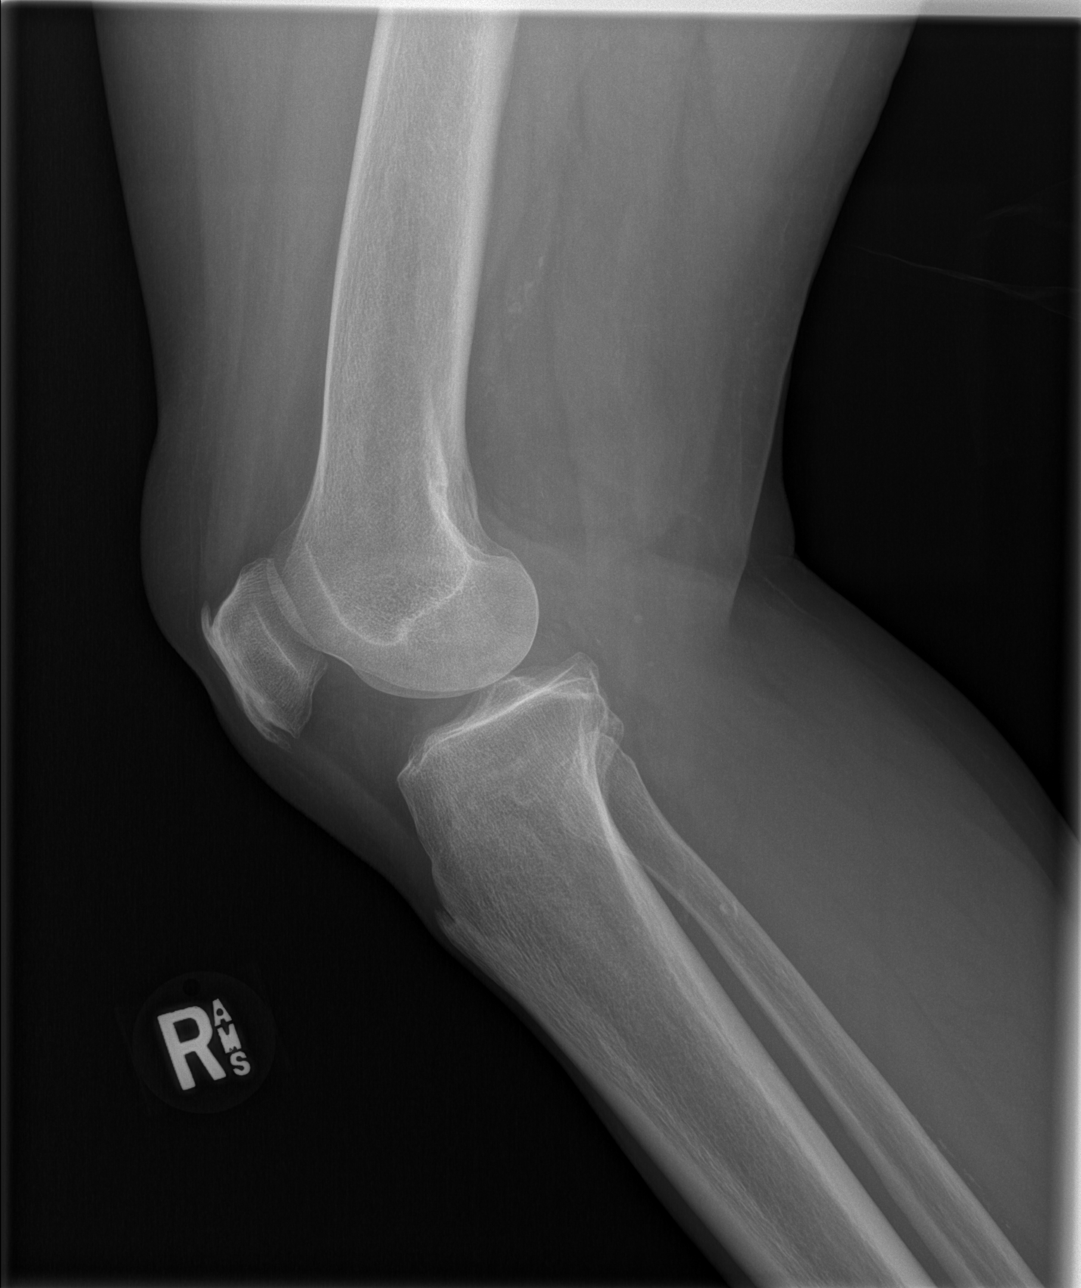

[4 of 4 positions shown; findings below may reference images not displayed]

FINDINGS: No fracture or dislocation is seen.

The joint spaces are preserved.

Suprapatellar and infrapatellar enthesopathy.

Visualized soft tissues are within normal limits.

No suprapatellar knee joint effusion.
IMPRESSION: Negative.

## 2020-04-13 ENCOUNTER — Other Ambulatory Visit (HOSPITAL_COMMUNITY): Payer: Self-pay | Admitting: Interventional Radiology

## 2020-04-13 ENCOUNTER — Telehealth (HOSPITAL_COMMUNITY): Payer: Self-pay

## 2020-04-13 DIAGNOSIS — I671 Cerebral aneurysm, nonruptured: Secondary | ICD-10-CM

## 2020-04-13 NOTE — Telephone Encounter (Signed)
Called to schedule mra, number in system is not in service. AW

## 2020-04-23 ENCOUNTER — Telehealth (HOSPITAL_COMMUNITY): Payer: Self-pay

## 2020-04-23 NOTE — Telephone Encounter (Signed)
Called to schedule mra, no answer, no vm. AW 

## 2020-04-30 ENCOUNTER — Telehealth (HOSPITAL_COMMUNITY): Payer: Self-pay

## 2020-04-30 NOTE — Telephone Encounter (Signed)
Called to schedule mra, no answer, left vm. AW  ?

## 2020-08-08 ENCOUNTER — Telehealth (HOSPITAL_COMMUNITY): Payer: Self-pay

## 2020-08-08 NOTE — Telephone Encounter (Signed)
Called to schedule mra, no answer, no vm. AW 

## 2020-08-16 ENCOUNTER — Other Ambulatory Visit: Payer: Self-pay | Admitting: Family Medicine

## 2020-08-16 DIAGNOSIS — N183 Chronic kidney disease, stage 3 unspecified: Secondary | ICD-10-CM

## 2020-08-16 DIAGNOSIS — E1122 Type 2 diabetes mellitus with diabetic chronic kidney disease: Secondary | ICD-10-CM

## 2020-08-16 DIAGNOSIS — I10 Essential (primary) hypertension: Secondary | ICD-10-CM

## 2020-08-16 DIAGNOSIS — N1832 Chronic kidney disease, stage 3b: Secondary | ICD-10-CM

## 2020-08-16 MED ORDER — GLIPIZIDE 10 MG PO TABS: 10.0000 mg | ORAL_TABLET | Freq: Two times a day (BID) | ORAL | 1 refills | Status: DC

## 2020-08-16 MED ORDER — ROSUVASTATIN CALCIUM 10 MG PO TABS: 10.0000 mg | ORAL_TABLET | Freq: Every day | ORAL | 1 refills | Status: DC

## 2020-08-16 MED ORDER — TRUE METRIX METER W/DEVICE KIT: PACK | 0 refills | Status: DC

## 2020-08-16 MED ORDER — CARVEDILOL 3.125 MG PO TABS: 3.1250 mg | ORAL_TABLET | Freq: Two times a day (BID) | ORAL | 1 refills | Status: DC

## 2020-08-16 MED ORDER — VERAPAMIL HCL 80 MG PO TABS: 80.0000 mg | ORAL_TABLET | Freq: Two times a day (BID) | ORAL | 1 refills | Status: DC

## 2020-08-16 MED ORDER — ISOSORBIDE MONONITRATE ER 60 MG PO TB24: 60.0000 mg | ORAL_TABLET | Freq: Every day | ORAL | 1 refills | Status: DC

## 2020-08-16 MED ORDER — GLUCOSE BLOOD VI STRP: ORAL_STRIP | 2 refills | Status: DC

## 2020-08-16 MED FILL — VERAPAMIL 80 MG TABLET: 80 | 90 days supply | Qty: 180 | Fill #0

## 2020-08-16 MED FILL — ROSUVASTATIN CALCIUM 10 MG: 10 | 90 days supply | Qty: 90 | Fill #0

## 2020-08-16 MED FILL — ACCU-CHEK SOFTCLIX LANCETS: 90 days supply | Qty: 100 | Fill #2

## 2020-08-16 MED FILL — glipiZIDE 10 MG TABS: 10 | 90 days supply | Qty: 180 | Fill #0

## 2020-08-16 MED FILL — ACCU-CHEK GUIDE TEST STRIP: 90 days supply | Qty: 100 | Fill #3

## 2020-08-16 MED FILL — CARVEDILOL 3.125 MG TABLET: 3.125 | 90 days supply | Qty: 180 | Fill #0

## 2020-08-16 MED FILL — ISOSORBIDE MN ER 60 MG TAB: 60 | 90 days supply | Qty: 90 | Fill #0

## 2020-08-16 NOTE — Telephone Encounter (Signed)
Medication Refill - Medication: amlodipine, blood glucose monitoring supplies, carvedilol, glipizide, glucose blood test strips, isosorbide, rosuvastatin, lancets, verapamil   Has the patient contacted their pharmacy? Yes.   (Agent: If no, request that the patient contact the pharmacy for the refill.) (Agent: If yes, when and what did the pharmacy advise?)  Preferred Pharmacy (with phone number or street name):  [facility], [address]  [place] [address]  Phone: [phone] Fax: [phone]  Hours: Not open 24 hours     Agent: Please be advised that RX refills may take up to 3 business days. We ask that you follow-up with your pharmacy.

## 2020-08-24 MED FILL — ACCU-CHEK GUIDE TEST STRIP: 90 days supply | Qty: 100 | Fill #0

## 2020-11-06 ENCOUNTER — Telehealth: Payer: Self-pay

## 2020-11-06 NOTE — Telephone Encounter (Signed)
Patient's daughter dropped off SCAT application this afternoon. 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

## 2020-11-08 NOTE — Telephone Encounter (Signed)
Completed SCAT application faxed to [facility]

## 2020-11-27 ENCOUNTER — Telehealth: Payer: Self-pay

## 2020-11-27 NOTE — Telephone Encounter (Signed)
Call received from Paula [name] who stated that they did not receive the application for services despite fax confirmation that was received at [facility]. Application refaxed to [facility]

## 2020-12-06 ENCOUNTER — Other Ambulatory Visit: Payer: Self-pay

## 2020-12-06 ENCOUNTER — Other Ambulatory Visit: Payer: Self-pay | Admitting: Family Medicine

## 2020-12-06 MED FILL — Rosuvastatin Calcium Tab 10 MG: ORAL | 90 days supply | Qty: 90 | Fill #0 | Status: AC

## 2020-12-06 MED FILL — Glucose Blood Test Strip: 90 days supply | Qty: 100 | Fill #0 | Status: AC

## 2020-12-06 MED FILL — Glipizide Tab 10 MG: ORAL | 90 days supply | Qty: 180 | Fill #0 | Status: AC

## 2020-12-06 MED FILL — Isosorbide Mononitrate Tab ER 24HR 60 MG: ORAL | 90 days supply | Qty: 90 | Fill #0 | Status: AC

## 2020-12-06 MED FILL — Verapamil HCl Tab 80 MG: ORAL | 90 days supply | Qty: 180 | Fill #0 | Status: AC

## 2020-12-06 MED FILL — Carvedilol Tab 3.125 MG: ORAL | 90 days supply | Qty: 180 | Fill #0 | Status: AC

## 2020-12-06 NOTE — Telephone Encounter (Signed)
  Notes to clinic: Patient has a upcoming a appointment on 01/03/2021 Review for a short supply    Requested Prescriptions  Pending Prescriptions Disp Refills   Accu-Chek Softclix Lancets lancets 100 each 11    Sig: USE AS INSTRUCTED TO CHECK BLOOD SUGAR DAILY.      Endocrinology: Diabetes - Testing Supplies Failed - 12/06/2020  1:16 PM      Failed - Valid encounter within last 12 months    Recent Outpatient Visits           1 year ago Encounter for screening mammogram for malignant neoplasm of breast   [place], [place], MD   1 year ago Type 2 diabetes mellitus with stage 3 chronic kidney disease, without long-term current use of insulin ([place])   [place], [place], MD   1 year ago Type 2 diabetes mellitus with stage 3 chronic kidney disease, without long-term current use of insulin ([place])   [place], [place], MD   1 year ago Type 2 diabetes mellitus with stage 3 chronic kidney disease, without long-term current use of insulin ([place])   Lorenzo, Enobong, MD   1 year ago Chronic pain of left knee   [place], Enobong, MD       Future Appointments             In 4 weeks Wynetta Emery Dalbert Batman, MD [place]

## 2020-12-07 ENCOUNTER — Other Ambulatory Visit: Payer: Self-pay

## 2020-12-07 MED ORDER — ACCU-CHEK SOFTCLIX LANCETS MISC
0 refills | Status: DC
  Filled 2020-12-07: qty 100, 90d supply, fill #0
  Filled 2020-12-12: qty 100, 25d supply, fill #0

## 2020-12-10 ENCOUNTER — Ambulatory Visit (HOSPITAL_COMMUNITY): Payer: Medicare Other

## 2020-12-11 ENCOUNTER — Other Ambulatory Visit: Payer: Self-pay

## 2020-12-11 ENCOUNTER — Other Ambulatory Visit: Payer: Self-pay | Admitting: Family Medicine

## 2020-12-11 DIAGNOSIS — I10 Essential (primary) hypertension: Secondary | ICD-10-CM

## 2020-12-11 NOTE — Telephone Encounter (Signed)
   Notes to clinic: Patient has appointment on 01/03/2021 Requesting a 180 day supply [place]    Requested Prescriptions  Pending Prescriptions Disp Refills   amLODipine (NORVASC) 5 MG tablet 90 tablet 1    Sig: Take 1 tablet (5 mg total) by mouth daily.      Cardiovascular:  Calcium Channel Blockers Failed - 12/11/2020  9:25 AM      Failed - Valid encounter within last 6 months    Recent Outpatient Visits           1 year ago Encounter for screening mammogram for malignant neoplasm of breast   [place], [place], MD   1 year ago Type 2 diabetes mellitus with stage 3 chronic kidney disease, without long-term current use of insulin ([place])   [place], [place], MD   1 year ago Type 2 diabetes mellitus with stage 3 chronic kidney disease, without long-term current use of insulin ([place])   [place], [place], MD   1 year ago Type 2 diabetes mellitus with stage 3 chronic kidney disease, without long-term current use of insulin ([place])   Hayden, Enobong, MD   1 year ago Chronic pain of left knee   [place], Enobong, MD       Future Appointments             In 3 weeks Ladell Pier, MD [place] - Last BP in normal range    BP Readings from Last 1 Encounters:  09/20/19 139/84

## 2020-12-12 ENCOUNTER — Other Ambulatory Visit: Payer: Self-pay

## 2020-12-12 MED ORDER — AMLODIPINE BESYLATE 10 MG PO TABS
10.0000 mg | ORAL_TABLET | Freq: Every day | ORAL | 0 refills | Status: DC
  Filled 2020-12-12: qty 30, 30d supply, fill #0

## 2020-12-12 NOTE — Telephone Encounter (Signed)
Please inform her that she should not be on both amlodipine and diltiazem.  I have increased amlodipine to 10 mg I would need her to discontinue the diltiazem also combination of both can affect her heart.  Her blood pressure and heart rate will be reassessed at her upcoming visit.

## 2020-12-12 NOTE — Telephone Encounter (Signed)
Call placed to patient. Explained Dr. Smitty Pluck recommendations. Pt verbalizes understanding to take amlodipine 10 mg once daily. She also voices understanding to stop verapamil.

## 2020-12-14 ENCOUNTER — Other Ambulatory Visit: Payer: Self-pay

## 2020-12-14 MED ORDER — CALCITRIOL 0.25 MCG PO CAPS
ORAL_CAPSULE | ORAL | 6 refills | Status: DC
  Filled 2020-12-14: qty 30, 30d supply, fill #0
  Filled 2021-01-09: qty 30, 30d supply, fill #1
  Filled 2021-02-05: qty 30, 30d supply, fill #2

## 2020-12-29 ENCOUNTER — Emergency Department (HOSPITAL_COMMUNITY): Payer: Medicare Other

## 2020-12-29 ENCOUNTER — Other Ambulatory Visit: Payer: Self-pay

## 2020-12-29 ENCOUNTER — Emergency Department (HOSPITAL_COMMUNITY)
Admission: EM | Admit: 2020-12-29 | Discharge: 2020-12-29 | Disposition: A | Discharge status: Home / Self Care | Payer: Medicare Other | Attending: Emergency Medicine | Admitting: Emergency Medicine

## 2020-12-29 DIAGNOSIS — R42 Dizziness and giddiness: Secondary | ICD-10-CM | POA: Insufficient documentation

## 2020-12-29 DIAGNOSIS — E1140 Type 2 diabetes mellitus with diabetic neuropathy, unspecified: Secondary | ICD-10-CM | POA: Diagnosis not present

## 2020-12-29 DIAGNOSIS — Z20822 Contact with and (suspected) exposure to covid-19: Secondary | ICD-10-CM | POA: Diagnosis not present

## 2020-12-29 DIAGNOSIS — Z7982 Long term (current) use of aspirin: Secondary | ICD-10-CM | POA: Diagnosis not present

## 2020-12-29 DIAGNOSIS — Z79899 Other long term (current) drug therapy: Secondary | ICD-10-CM | POA: Diagnosis not present

## 2020-12-29 DIAGNOSIS — I5032 Chronic diastolic (congestive) heart failure: Secondary | ICD-10-CM | POA: Insufficient documentation

## 2020-12-29 DIAGNOSIS — E871 Hypo-osmolality and hyponatremia: Secondary | ICD-10-CM | POA: Diagnosis not present

## 2020-12-29 DIAGNOSIS — J302 Other seasonal allergic rhinitis: Secondary | ICD-10-CM | POA: Insufficient documentation

## 2020-12-29 DIAGNOSIS — R112 Nausea with vomiting, unspecified: Secondary | ICD-10-CM | POA: Diagnosis present

## 2020-12-29 DIAGNOSIS — E1122 Type 2 diabetes mellitus with diabetic chronic kidney disease: Secondary | ICD-10-CM | POA: Diagnosis not present

## 2020-12-29 DIAGNOSIS — Z7984 Long term (current) use of oral hypoglycemic drugs: Secondary | ICD-10-CM | POA: Insufficient documentation

## 2020-12-29 DIAGNOSIS — N185 Chronic kidney disease, stage 5: Secondary | ICD-10-CM | POA: Diagnosis not present

## 2020-12-29 DIAGNOSIS — I132 Hypertensive heart and chronic kidney disease with heart failure and with stage 5 chronic kidney disease, or end stage renal disease: Secondary | ICD-10-CM | POA: Insufficient documentation

## 2020-12-29 LAB — CBC
HCT: 30.5 % — ABNORMAL LOW (ref 36.0–46.0)
Hemoglobin: 10.9 g/dL — ABNORMAL LOW (ref 12.0–15.0)
MCH: 29.5 pg (ref 26.0–34.0)
MCHC: 35.7 g/dL (ref 30.0–36.0)
MCV: 82.4 fL (ref 80.0–100.0)
Platelets: 207 10*3/uL (ref 150–400)
RBC: 3.7 MIL/uL — ABNORMAL LOW (ref 3.87–5.11)
RDW: 12 % (ref 11.5–15.5)
WBC: 5.3 10*3/uL (ref 4.0–10.5)
nRBC: 0 % (ref 0.0–0.2)

## 2020-12-29 LAB — COMPREHENSIVE METABOLIC PANEL
ALT: 19 U/L (ref 0–44)
AST: 22 U/L (ref 15–41)
Albumin: 3.6 g/dL (ref 3.5–5.0)
Alkaline Phosphatase: 89 U/L (ref 38–126)
Anion gap: 11 (ref 5–15)
BUN: 42 mg/dL — ABNORMAL HIGH (ref 8–23)
CO2: 25 mmol/L (ref 22–32)
Calcium: 8.8 mg/dL — ABNORMAL LOW (ref 8.9–10.3)
Chloride: 88 mmol/L — ABNORMAL LOW (ref 98–111)
Creatinine, Ser: 3.77 mg/dL — ABNORMAL HIGH (ref 0.44–1.00)
GFR, Estimated: 12 mL/min — ABNORMAL LOW (ref >60)
Glucose, Bld: 163 mg/dL — ABNORMAL HIGH (ref 70–99)
Potassium: 4 mmol/L (ref 3.5–5.1)
Sodium: 124 mmol/L — ABNORMAL LOW (ref 135–145)
Total Bilirubin: 0.7 mg/dL (ref 0.3–1.2)
Total Protein: 7.1 g/dL (ref 6.5–8.1)

## 2020-12-29 LAB — RAPID URINE DRUG SCREEN, HOSP PERFORMED
Amphetamines: NOT DETECTED
Barbiturates: NOT DETECTED
Benzodiazepines: NOT DETECTED
Cocaine: NOT DETECTED
Opiates: NOT DETECTED
Tetrahydrocannabinol: NOT DETECTED

## 2020-12-29 LAB — ETHANOL: Alcohol, Ethyl (B): <10 mg/dL (ref <10)

## 2020-12-29 LAB — DIFFERENTIAL
Abs Immature Granulocytes: 0.02 10*3/uL (ref 0.00–0.07)
Basophils Absolute: 0 10*3/uL (ref 0.0–0.1)
Basophils Relative: 0 %
Eosinophils Absolute: 0.1 10*3/uL (ref 0.0–0.5)
Eosinophils Relative: 3 %
Immature Granulocytes: 0 %
Lymphocytes Relative: 16 %
Lymphs Abs: 0.9 10*3/uL (ref 0.7–4.0)
Monocytes Absolute: 0.4 10*3/uL (ref 0.1–1.0)
Monocytes Relative: 8 %
Neutro Abs: 3.8 10*3/uL (ref 1.7–7.7)
Neutrophils Relative %: 73 %

## 2020-12-29 LAB — URINALYSIS, ROUTINE W REFLEX MICROSCOPIC
Bacteria, UA: NONE SEEN
Bilirubin Urine: NEGATIVE
Glucose, UA: NEGATIVE mg/dL
Hgb urine dipstick: NEGATIVE
Ketones, ur: NEGATIVE mg/dL
Leukocytes,Ua: NEGATIVE
Nitrite: NEGATIVE
Protein, ur: >=300 mg/dL — AB
Specific Gravity, Urine: 1.004 — ABNORMAL LOW (ref 1.005–1.030)
pH: 6 (ref 5.0–8.0)

## 2020-12-29 LAB — RESP PANEL BY RT-PCR (FLU A&B, COVID) ARPGX2
Influenza A by PCR: NEGATIVE
Influenza B by PCR: NEGATIVE
SARS Coronavirus 2 by RT PCR: NEGATIVE

## 2020-12-29 LAB — PROTIME-INR
INR: 1.1 (ref 0.8–1.2)
Prothrombin Time: 13.7 seconds (ref 11.4–15.2)

## 2020-12-29 LAB — APTT: aPTT: <20 seconds — ABNORMAL LOW (ref 24–36)

## 2020-12-29 MED ORDER — MECLIZINE HCL 25 MG PO TABS
25.0000 mg | ORAL_TABLET | Freq: Three times a day (TID) | ORAL | 0 refills | Status: DC | PRN
  Filled 2020-12-29: qty 30, 10d supply, fill #0

## 2020-12-29 MED ORDER — ONDANSETRON HCL 4 MG/2ML IJ SOLN
4.0000 mg | Freq: Once | INTRAMUSCULAR | Status: AC
Start: 2020-12-29 — End: 2020-12-29
  Administered 2020-12-29: 4 mg via INTRAVENOUS
  Filled 2020-12-29: qty 2

## 2020-12-29 MED ORDER — MECLIZINE HCL 25 MG PO TABS
12.5000 mg | ORAL_TABLET | Freq: Once | ORAL | Status: AC
  Administered 2020-12-29: 12.5 mg via ORAL
  Filled 2020-12-29: qty 1

## 2020-12-29 MED ORDER — SODIUM CHLORIDE 0.9 % IV SOLN: 100.0000 mL/h | INTRAVENOUS | Status: DC

## 2020-12-29 MED ORDER — SODIUM CHLORIDE 0.9 % IV BOLUS
500.0000 mL | Freq: Once | INTRAVENOUS | Status: AC
  Administered 2020-12-29: 500 mL via INTRAVENOUS

## 2020-12-29 NOTE — ED Triage Notes (Signed)
Brought in by [facility] from home for dizziness, diarrhea, and nausea that started yesterday.    BP: 140/70 HR: 67 SpO2: 98 RA CBG:183

## 2020-12-29 NOTE — ED Notes (Signed)
Patient transported to CT 

## 2020-12-29 NOTE — ED Provider Notes (Signed)
[facility] EMERGENCY DEPARTMENT Provider Note   CSN: [id number] Arrival date & time: 12/29/20  1305     History No chief complaint on file.   Paula [name] is a 75 y.o. female.  Pt presents to the ED today with dizziness, nausea, and diarrhea.  Pt said sx started yesterday.  She said the dizziness is worse with standing. She feels unsteady when she walks.  She has a little epigastric pain.  No cp or sob.  Pt has known CKD and is followed by nephrology.  She has recently seen her nephrologist and has a follow up in about 6 weeks.          Past Medical History:  Diagnosis Date  . Allergic rhinitis   . Arthritis   . Asthma   . Brain aneurysm   . Chronic kidney disease    Stage 3  . Cough   . Diabetes mellitus   . Diastolic CHF, chronic ([place]) 10/11/2011  . GERD (gastroesophageal reflux disease)   . History of colon polyps 2012   tubular adenoma   . Hyperlipidemia   . Hypertension   . Neuropathy 10/11/2011  . PONV (postoperative nausea and vomiting)    one time after lymph node surgery    Patient Active Problem List   Diagnosis Date Noted  . Upper airway cough syndrome 10/12/2017  . Chronic rhinitis 10/01/2017  . Seasonal allergic conjunctivitis 10/01/2017  . Aneurysm ([place]) 09/24/2017  . Acute metabolic encephalopathy 42/59/5638  . URI (upper respiratory infection) 09/02/2017  . Facial weakness 09/02/2017  . Benign essential HTN 09/02/2017  . Allergic rhinitis 08/20/2017  . Chronic pain 05/07/2017  . Palpitations 07/10/2016  . Insomnia 03/25/2016  . Gout 01/23/2016  . History of cataract 02/27/2015  . Seasonal and perennial allergic rhinitis 02/15/2012  . Cough 11/04/2011  . Diabetic neuropathy ([place]) 10/11/2011  . Chronic diastolic congestive heart failure ([place]) 10/11/2011  . CKD (chronic kidney disease) stage 3, GFR 30-59 ml/min (HCC) 10/10/2011  . GERD (gastroesophageal reflux disease)   . Type 2 diabetes mellitus without complication,  without long-term current use of insulin ([place]) 07/03/2010  . Hyperlipidemia 07/03/2010  . Essential hypertension 07/03/2010    Past Surgical History:  Procedure Laterality Date  . CATARACT EXTRACTION     right eye  . IR 3D INDEPENDENT WKST  12/14/2017  . IR ANGIO INTRA EXTRACRAN SEL COM CAROTID INNOMINATE BILAT MOD SED  09/15/2017  . IR ANGIO INTRA EXTRACRAN SEL COM CAROTID INNOMINATE UNI L MOD SED  12/14/2017  . IR ANGIO VERTEBRAL SEL VERTEBRAL BILAT MOD SED  09/15/2017  . IR RADIOLOGIST EVAL & MGMT  09/10/2017  . IR RADIOLOGIST EVAL & MGMT  10/19/2017  . lymphatic mass surgery    . lymphoma mass surgery Bilateral    non cancerous  . NASAL TURBINATE REDUCTION    . RADIOLOGY WITH ANESTHESIA N/A 12/14/2017   Procedure: RADIOLOGY WITH ANESTHESIA EMBOLIZATION;  Surgeon: Luanne Bras, MD;  Location: [facility];  Service: Radiology;  Laterality: N/A;  . VIDEO BRONCHOSCOPY Bilateral 11/18/2017   Procedure: VIDEO BRONCHOSCOPY WITHOUT FLUORO;  Surgeon: Collene Gobble, MD;  Location: [facility];  Service: Cardiopulmonary;  Laterality: Bilateral;     OB History    Gravida  5   Para  4   Term  4   Preterm      AB  1   Living  4     SAB      IAB  1  Ectopic      Multiple      Live Births              Family History  Problem Relation Age of Onset  . Hypertension Mother   . Asthma Sister   . Colon cancer Neg Hx   . Allergic rhinitis Neg Hx   . Eczema Neg Hx     Social History   Tobacco Use  . Smoking status: Never Smoker  . Smokeless tobacco: Never Used  Vaping Use  . Vaping Use: Never used  Substance Use Topics  . Alcohol use: No  . Drug use: No    Home Medications Prior to Admission medications   Medication Sig Start Date End Date Taking? Authorizing Provider  meclizine (ANTIVERT) 25 MG tablet Take 1 tablet (25 mg total) by mouth 3 (three) times daily as needed for dizziness. 12/29/20  Yes Isla Pence, MD  Accu-Chek Softclix Lancets lancets USE AS  INSTRUCTED TO CHECK BLOOD SUGAR DAILY. 12/07/20 12/06/21  Charlott Rakes, MD  acetaminophen (TYLENOL) 500 MG tablet Take 500-1,000 mg by mouth every 6 (six) hours as needed for headache (pain).     [provider]  amLODipine (NORVASC) 10 MG tablet Take 1 tablet (10 mg total) by mouth daily. 12/12/20   Charlott Rakes, MD  aspirin EC 81 MG tablet Take 81 mg by mouth every evening.    [provider]  azelastine (ASTELIN) 0.1 % nasal spray Place 2 sprays into both nostrils 2 (two) times daily. 03/21/19   Martyn Ehrich, NP  Blood Glucose Monitoring Suppl (TRUE METRIX METER) w/Device KIT Use to check blood sugar once daily. E11.21 08/16/20   Charlott Rakes, MD  calcitRIOL (ROCALTROL) 0.25 MCG capsule Take 1 capsule by mouth daily. 12/14/20   Edrick Oh, MD  carvedilol (COREG) 3.125 MG tablet TAKE 1 TABLET (3.125 MG TOTAL) BY MOUTH 2 (TWO) TIMES DAILY WITH A MEAL. 08/16/20 08/16/21  Newlin, Charlane Ferretti, MD  glipiZIDE (GLUCOTROL) 10 MG tablet TAKE 1 TABLET (10 MG TOTAL) BY MOUTH 2 (TWO) TIMES DAILY BEFORE A MEAL. 08/16/20 08/16/21  Charlott Rakes, MD  glucose blood test strip USE TO CHECK BLOOD SUGAR ONCE DAILY. E11.21 08/16/20 08/16/21  Charlott Rakes, MD  hydrocortisone 1 % ointment Apply 1 application topically 2 (two) times daily. 10/19/18   Charlott Rakes, MD  isosorbide mononitrate (IMDUR) 60 MG 24 hr tablet TAKE 1 TABLET (60 MG TOTAL) BY MOUTH DAILY. 08/16/20 08/16/21  Charlott Rakes, MD  rosuvastatin (CRESTOR) 10 MG tablet TAKE 1 TABLET (10 MG TOTAL) BY MOUTH DAILY. 08/16/20 08/16/21  Charlott Rakes, MD  sitaGLIPtin (JANUVIA) 25 MG tablet Take 1 tablet (25 mg total) by mouth daily. 09/12/19   Charlott Rakes, MD  sodium chloride (OCEAN) 0.65 % SOLN nasal spray Place 2 sprays into both nostrils as needed for congestion. 03/21/19   Martyn Ehrich, NP  TRUEplus Lancets 28G MISC Use to check blood sugar once daily. E11.21 02/24/20   Charlott Rakes, MD    Allergies    Dust mite extract, Ace  inhibitors, and Lovastatin  Review of Systems   Review of Systems  Neurological: Positive for dizziness.  All other systems reviewed and are negative.   Physical Exam Updated Vital Signs BP 118/85   Pulse 69   Temp 98.2 F (36.8 C) (Oral)   Resp 15   Ht _0  (1.575 m)   Wt 68 kg   LMP  (LMP Unknown)   SpO2 99%   BMI  27.44 kg/m   Physical Exam Vitals and nursing note reviewed.  Constitutional:      Appearance: Normal appearance.  HENT:     Head: Normocephalic and atraumatic.     Right Ear: External ear normal.     Left Ear: External ear normal.     Nose: Nose normal.     Mouth/Throat:     Mouth: Mucous membranes are moist.     Pharynx: Oropharynx is clear.  Eyes:     Extraocular Movements: Extraocular movements intact.     Conjunctiva/sclera: Conjunctivae normal.     Pupils: Pupils are equal, round, and reactive to light.  Cardiovascular:     Rate and Rhythm: Normal rate and regular rhythm.     Pulses: Normal pulses.     Heart sounds: Normal heart sounds.  Pulmonary:     Effort: Pulmonary effort is normal.     Breath sounds: Normal breath sounds.  Abdominal:     General: Abdomen is flat. Bowel sounds are normal.     Palpations: Abdomen is soft.  Musculoskeletal:        General: Normal range of motion.     Cervical back: Normal range of motion and neck supple.  Skin:    General: Skin is warm.     Capillary Refill: Capillary refill takes less than 2 seconds.  Neurological:     General: No focal deficit present.     Mental Status: She is alert and oriented to person, place, and time.  Psychiatric:        Mood and Affect: Mood normal.        Behavior: Behavior normal.        Thought Content: Thought content normal.        Judgment: Judgment normal.     ED Results / Procedures / Treatments   Labs (all labs ordered are listed, but only abnormal results are displayed) Labs Reviewed  APTT - Abnormal; Notable for the following components:      Result Value    aPTT <20 (*)    All other components within normal limits  CBC - Abnormal; Notable for the following components:   RBC 3.70 (*)    Hemoglobin 10.9 (*)    HCT 30.5 (*)    All other components within normal limits  COMPREHENSIVE METABOLIC PANEL - Abnormal; Notable for the following components:   Sodium 124 (*)    Chloride 88 (*)    Glucose, Bld 163 (*)    BUN 42 (*)    Creatinine, Ser 3.77 (*)    Calcium 8.8 (*)    GFR, Estimated 12 (*)    All other components within normal limits  URINALYSIS, ROUTINE W REFLEX MICROSCOPIC - Abnormal; Notable for the following components:   Color, Urine STRAW (*)    Specific Gravity, Urine 1.004 (*)    Protein, ur >=300 (*)    All other components within normal limits  RESP PANEL BY RT-PCR (FLU A&B, COVID) ARPGX2  ETHANOL  PROTIME-INR  DIFFERENTIAL  RAPID URINE DRUG SCREEN, HOSP PERFORMED    EKG EKG Interpretation  Date/Time:  Saturday Dec 29 2020 13:16:57 EDT Ventricular Rate:  67 PR Interval:  244 QRS Duration: 85 QT Interval:  422 QTC Calculation: 446 R Axis:   21 Text Interpretation: Sinus rhythm Prolonged PR interval No significant change since last tracing Confirmed by Isla Pence [phone]) on 12/29/2020 1:42:41 PM   Radiology CT HEAD WO CONTRAST  Result Date: 12/29/2020 CLINICAL DATA:  75 year old female  with dizziness and nausea for 1 day. EXAM: CT HEAD WITHOUT CONTRAST TECHNIQUE: Contiguous axial images were obtained from the base of the skull through the vertex without intravenous contrast. COMPARISON:  03/26/2019 CT, 08/09/2018 MR and prior studies FINDINGS: Brain: No evidence of acute infarction, hemorrhage, hydrocephalus, extra-axial collection or mass lesion/mass effect. Chronic small-vessel white matter ischemic changes are again noted. Vascular: Carotid atherosclerotic calcifications are noted. Skull: Normal. Negative for fracture or focal lesion. Sinuses/Orbits: No acute finding. Other: None. IMPRESSION: 1. No evidence of  acute intracranial abnormality. 2. Chronic small-vessel white matter ischemic changes. Electronically Signed   By: Margarette Canada M.D.   On: 12/29/2020 14:26    Procedures Procedures   Medications Ordered in ED Medications  sodium chloride 0.9 % bolus 500 mL (0 mLs Intravenous Stopped 12/29/20 1625)    Followed by  0.9 %  sodium chloride infusion (has no administration in time range)  ondansetron (ZOFRAN) injection 4 mg (4 mg Intravenous Given 12/29/20 1353)  meclizine (ANTIVERT) tablet 12.5 mg (12.5 mg Oral Given 12/29/20 1352)    ED Course  I have reviewed the triage vital signs and the nursing notes.  Pertinent labs & imaging results that were available during my care of the patient were reviewed by me and considered in my medical decision making (see chart for details).    MDM Rules/Calculators/A&P                          Pt's sodium is low.  She is given IVFs.    She is also given antivert and zofran.  She is able to ambulate without any problems and her dizziness is gone.  I suspect it was vertigo as she has a problem with her sinuses.  Pt also said she's been taking a sudafed nasal spray from [name] which has been helping her allergies.  I asked her to stop taking that and she is told to take coricidin hbp is she needs to take allergy meds.    She has known CKD.  She has f/u with nephrology in a little over a month.    She is to f/u with pcp to get labs rechecked in about a week.  Return if worse.    Final Clinical Impression(s) / ED Diagnoses Final diagnoses:  CKD (chronic kidney disease) stage 5, GFR less than 15 ml/min (HCC)  Vertigo  Seasonal allergies  Hyponatremia    Rx / DC Orders ED Discharge Orders         Ordered    meclizine (ANTIVERT) 25 MG tablet  3 times daily PRN        12/29/20 1714           Isla Pence, MD 12/29/20 1719

## 2020-12-29 NOTE — Discharge Instructions (Signed)
Stop the sudafed nasal spray from [name].  Start Coricidin HBP which will help your sinuses.  You will need to follow up with your PCP in about 1 week to get your sodium rechecked.

## 2020-12-31 ENCOUNTER — Telehealth: Payer: Self-pay | Admitting: *Deleted

## 2020-12-31 ENCOUNTER — Other Ambulatory Visit: Payer: Self-pay

## 2020-12-31 ENCOUNTER — Ambulatory Visit (HOSPITAL_COMMUNITY)
Admission: RE | Admit: 2020-12-31 | Discharge: 2020-12-31 | Disposition: A | Discharge status: Home / Self Care | Payer: Medicare Other | Source: Ambulatory Visit | Attending: Interventional Radiology | Admitting: Interventional Radiology

## 2020-12-31 DIAGNOSIS — I671 Cerebral aneurysm, nonruptured: Secondary | ICD-10-CM | POA: Insufficient documentation

## 2020-12-31 NOTE — Telephone Encounter (Signed)
Transition Care Management Follow-up Telephone Call  Date of discharge and from where: 12/29/2020 - [facility]  How have you been since you were released from the hospital? "Good"  Any questions or concerns? No  Items Reviewed:  Did the pt receive and understand the discharge instructions provided? Yes   Medications obtained and verified? Yes   Other? No   Any new allergies since your discharge? No   Dietary orders reviewed? No  Do you have support at home? Yes    Functional Questionnaire: (I = Independent and D = Dependent) ADLs: I  Bathing/Dressing- I  Meal Prep- I  Eating- I  Maintaining continence- I  Transferring/Ambulation- I  Managing Meds- I  Follow up appointments reviewed:   PCP Hospital f/u appt confirmed? Yes  Scheduled to see Dr. Margarita Rana on 01/03/2021 @ 1110.  [facility] f/u appt confirmed? No    Are transportation arrangements needed? No   If their condition worsens, is the pt aware to call PCP or go to the Emergency Dept.? Yes  Was the patient provided with contact information for the PCP's office or ED? Yes  Was to pt encouraged to call back with questions or concerns? Yes

## 2021-01-03 ENCOUNTER — Ambulatory Visit: Payer: Medicare Other | Admitting: Internal Medicine

## 2021-01-03 ENCOUNTER — Telehealth: Payer: Self-pay | Admitting: Family Medicine

## 2021-01-03 ENCOUNTER — Telehealth (HOSPITAL_COMMUNITY): Payer: Self-pay

## 2021-01-03 ENCOUNTER — Other Ambulatory Visit: Payer: Self-pay

## 2021-01-03 NOTE — Telephone Encounter (Signed)
Pt agreed to f/u in 1 year with mra head wo. AW  

## 2021-01-03 NOTE — Telephone Encounter (Signed)
   Paula [name] DOB: 1946/03/29 MRN: [id number]   RIDER WAIVER AND RELEASE OF LIABILITY  For purposes of improving physical access to our facilities, [facility] is pleased to partner with third parties to provide [facility] patients or other authorized individuals the option of convenient, on-demand ground transportation services (the [place]") through use of the technology service that enables users to request on-demand ground transportation from independent third-party providers.  By opting to use and accept these [employer], I, the undersigned, hereby agree on behalf of myself, and on behalf of any minor child using the [employer] for whom I am the parent or legal guardian, as follows:  1. Government social research officer provided to me are provided by independent third-party transportation providers who are not [employer] or employees and who are unaffiliated with [employer]. 2. [facility] is neither a transportation carrier nor a common or public carrier. 3. [facility] has no control over the quality or safety of the transportation that occurs as a result of the [employer]. 4. [facility] cannot guarantee that any third-party transportation provider will complete any arranged transportation service. 5. [facility] makes no representation, warranty, or guarantee regarding the reliability, timeliness, quality, safety, suitability, or availability of any of the Transport Services or that they will be error free. 6. I fully understand that traveling by vehicle involves risks and dangers of serious bodily injury, including permanent disability, paralysis, and death. I agree, on behalf of myself and on behalf of any minor child using the Transport Services for whom I am the parent or legal guardian, that the entire risk arising out of my use of the [employer] remains solely with me, to the maximum extent permitted under applicable law. 7. The [employer] are provided "as is" and "as available." [facility] disclaims all representations and warranties, express, implied or statutory, not expressly set out in these terms, including the implied warranties of merchantability and fitness for a particular purpose. 8. I hereby waive and release [facility], its agents, employees, officers, directors, representatives, insurers, attorneys, assigns, successors, subsidiaries, and affiliates from any and all past, present, or future claims, demands, liabilities, actions, causes of action, or suits of any kind directly or indirectly arising from acceptance and use of the [employer]. 9. I further waive and release [facility] and its affiliates from all present and future liability and responsibility for any injury or death to persons or damages to property caused by or related to the use of the [employer]. 10. I have read this Waiver and Release of Liability, and I understand the terms used in it and their legal significance. This Waiver is freely and voluntarily given with the understanding that my right (as well as the right of any minor child for whom I am the parent or legal guardian using the [employer]) to legal recourse against [facility] in connection with the [employer] is knowingly surrendered in return for use of these services.   I attest that I read the consent document to Paula [name], gave Ms. Farnam the opportunity to ask questions and answered the questions asked (if any). I affirm that Paula [name] then provided consent for she's participation in this program.     Darrick Meigs [place]

## 2021-01-04 ENCOUNTER — Encounter: Payer: Self-pay | Admitting: Internal Medicine

## 2021-01-04 ENCOUNTER — Other Ambulatory Visit: Payer: Self-pay

## 2021-01-04 ENCOUNTER — Ambulatory Visit: Payer: Medicare Other | Attending: Internal Medicine | Admitting: Internal Medicine

## 2021-01-04 VITALS — BP 130/73 | HR 61 | Resp 16 | Ht 62.0 in | Wt 136.2 lb

## 2021-01-04 DIAGNOSIS — E1122 Type 2 diabetes mellitus with diabetic chronic kidney disease: Secondary | ICD-10-CM

## 2021-01-04 DIAGNOSIS — I5030 Unspecified diastolic (congestive) heart failure: Secondary | ICD-10-CM | POA: Insufficient documentation

## 2021-01-04 DIAGNOSIS — Z28310 Unvaccinated for covid-19: Secondary | ICD-10-CM | POA: Insufficient documentation

## 2021-01-04 DIAGNOSIS — I152 Hypertension secondary to endocrine disorders: Secondary | ICD-10-CM

## 2021-01-04 DIAGNOSIS — M5432 Sciatica, left side: Secondary | ICD-10-CM | POA: Diagnosis present

## 2021-01-04 DIAGNOSIS — E785 Hyperlipidemia, unspecified: Secondary | ICD-10-CM | POA: Diagnosis not present

## 2021-01-04 DIAGNOSIS — I130 Hypertensive heart and chronic kidney disease with heart failure and stage 1 through stage 4 chronic kidney disease, or unspecified chronic kidney disease: Secondary | ICD-10-CM | POA: Diagnosis not present

## 2021-01-04 DIAGNOSIS — E1140 Type 2 diabetes mellitus with diabetic neuropathy, unspecified: Secondary | ICD-10-CM | POA: Insufficient documentation

## 2021-01-04 DIAGNOSIS — R053 Chronic cough: Secondary | ICD-10-CM | POA: Insufficient documentation

## 2021-01-04 DIAGNOSIS — Z1231 Encounter for screening mammogram for malignant neoplasm of breast: Secondary | ICD-10-CM

## 2021-01-04 DIAGNOSIS — Z7982 Long term (current) use of aspirin: Secondary | ICD-10-CM | POA: Diagnosis not present

## 2021-01-04 DIAGNOSIS — Z8249 Family history of ischemic heart disease and other diseases of the circulatory system: Secondary | ICD-10-CM | POA: Diagnosis not present

## 2021-01-04 DIAGNOSIS — N183 Chronic kidney disease, stage 3 unspecified: Secondary | ICD-10-CM | POA: Insufficient documentation

## 2021-01-04 DIAGNOSIS — D649 Anemia, unspecified: Secondary | ICD-10-CM

## 2021-01-04 DIAGNOSIS — E1159 Type 2 diabetes mellitus with other circulatory complications: Secondary | ICD-10-CM

## 2021-01-04 DIAGNOSIS — G8929 Other chronic pain: Secondary | ICD-10-CM | POA: Diagnosis not present

## 2021-01-04 DIAGNOSIS — Z79899 Other long term (current) drug therapy: Secondary | ICD-10-CM | POA: Insufficient documentation

## 2021-01-04 DIAGNOSIS — N1832 Chronic kidney disease, stage 3b: Secondary | ICD-10-CM | POA: Diagnosis not present

## 2021-01-04 DIAGNOSIS — Z23 Encounter for immunization: Secondary | ICD-10-CM

## 2021-01-04 LAB — POCT GLYCOSYLATED HEMOGLOBIN (HGB A1C): HbA1c, POC (controlled diabetic range): 5.8 % (ref 0.0–7.0)

## 2021-01-04 LAB — GLUCOSE, POCT (MANUAL RESULT ENTRY): POC Glucose: 136 mg/dl — AB (ref 70–99)

## 2021-01-04 NOTE — Progress Notes (Signed)
Pt has Verapamil '80mg'$  in medication bag but it is not listed on medication list

## 2021-01-04 NOTE — Progress Notes (Signed)
Patient ID: Paula [name], female    DOB: 1945/12/02  MRN: [id number]  CC: Diabetes and Hypertension   Subjective: Paula [name] is a 75 y.o. female who presents for chronic ds management.    Her concerns today include:  Pt with hx of HTN, DM type II with neuropathy, CKD 3, HL, diastolic CHF, UACS, vertebrobasilar system stenosis (followed by IR - Dr Estanislado Pandy)  PCP is Dr. Margarita Rana.  Pt wanted to see her but she did not have any available appts at this time.  She was in [name] for past 16 mths.  Returned 1 mth ago.  Seen in the emergency room 12/29/2020 with dizziness, nausea and diarrhea.  Screens for influenza and COVID were negative.  Noted to have slight worsening of anemia and significant worsening of kidney function.  Previous creatinine was 2.47.  Most recent creatinine was 3.77 with GFR of 12.   Today she denies any further dizziness or fatigue.  No blood in the stools.  She tells me she saw her kidney specialist Dr. [name] about 3 weeks ago and was told that her kidney function had worsened. Not on NSAIDs.  DM Results for orders placed or performed in visit on 01/04/21  POCT glucose (manual entry)  Result Value Ref Range   POC Glucose 136 (A) 70 - 99 mg/dl  POCT glycosylated hemoglobin (Hb A1C)  Result Value Ref Range   Hemoglobin A1C     HbA1c POC (<> result, manual entry)     HbA1c, POC (prediabetic range)     HbA1c, POC (controlled diabetic range) 5.8 0.0 - 7.0 %  Patient was on glipizide and Januvia.  Januvia stopped 1 year ago by her doctor in [name] due to worsening kidney function.  She stopped glipizide about 6 months ago because she was having frequent hypoglycemia.  Checks blood sugars twice a day.  Ranges 105-114.  Doing okay with eating habits. Had eye exam last wk [facility] Reports being told that her lenses from previous cataract surgery are cloudy. Has f/u next mth for possible procedure  Complaining of flareup of left-sided sciatica x3 months.  She  fell about 6 months ago on a wet floor while in [name].  Landed on her left side.  Pain is intermittent and goes down the left leg.  No numbness or tingling in the leg.  Denies any loss of bowel or bladder function.  She rates the pain as 5 out of 10.  Worse with prolonged walking and when she lays on her left side.  She has been taking Tylenol which helps.  Reports similar episode back in 2020 and was referred to physical therapy with good results.  Complains of chronic cough and drainage in the back of the throat.  States this started happening after she had surgery on the upper mouth 26 years ago.  Nasal passages feel blocked at night.  She had been using Sudafed with good results but her kidney specialist told her to discontinue using that.  She tells me she has tried everything else in the past including allergy medications in the form of oral medications and nasal spray without relief.  Also reports seeing an ear nose and throat specialist several years ago and did not find that helpful.  She has upper airway cough syndrome listed on her chart as one of her medical problems.  HM: declines further colon cancer screening.  She has had polyps removed in the past.  Did not get COVID vaccine  because she is afraid that it may make her kidney function worse.  Due for mammogram.  Patient Active Problem List   Diagnosis Date Noted  . Upper airway cough syndrome 10/12/2017  . Chronic rhinitis 10/01/2017  . Seasonal allergic conjunctivitis 10/01/2017  . Aneurysm ([place]) 09/24/2017  . Acute metabolic encephalopathy 65/99/3570  . URI (upper respiratory infection) 09/02/2017  . Facial weakness 09/02/2017  . Benign essential HTN 09/02/2017  . Allergic rhinitis 08/20/2017  . Chronic pain 05/07/2017  . Palpitations 07/10/2016  . Insomnia 03/25/2016  . Gout 01/23/2016  . History of cataract 02/27/2015  . Seasonal and perennial allergic rhinitis 02/15/2012  . Cough 11/04/2011  . Diabetic neuropathy ([place])  10/11/2011  . Chronic diastolic congestive heart failure ([place]) 10/11/2011  . CKD (chronic kidney disease) stage 3, GFR 30-59 ml/min ([facility]) 10/10/2011  . GERD (gastroesophageal reflux disease)   . Type 2 diabetes mellitus without complication, without long-term current use of insulin ([place]) 07/03/2010  . Hyperlipidemia 07/03/2010  . Essential hypertension 07/03/2010     Current Outpatient Medications on File Prior to Visit  Medication Sig Dispense Refill  . Accu-Chek Softclix Lancets lancets USE AS INSTRUCTED TO CHECK BLOOD SUGAR DAILY. 100 each 0  . acetaminophen (TYLENOL) 500 MG tablet Take 500-1,000 mg by mouth every 6 (six) hours as needed for headache (pain).     Marland Kitchen amLODipine (NORVASC) 10 MG tablet Take 1 tablet (10 mg total) by mouth daily. 30 tablet 0  . aspirin EC 81 MG tablet Take 81 mg by mouth every evening.    . Blood Glucose Monitoring Suppl (TRUE METRIX METER) w/Device KIT Use to check blood sugar once daily. E11.21 1 kit 0  . calcitRIOL (ROCALTROL) 0.25 MCG capsule Take 1 capsule by mouth daily. 30 capsule 6  . carvedilol (COREG) 3.125 MG tablet TAKE 1 TABLET (3.125 MG TOTAL) BY MOUTH 2 (TWO) TIMES DAILY WITH A MEAL. 180 tablet 1  . glucose blood test strip USE TO CHECK BLOOD SUGAR ONCE DAILY. E11.21 100 strip 2  . hydrocortisone 1 % ointment Apply 1 application topically 2 (two) times daily. (Patient not taking: Reported on 01/04/2021) 30 g 1  . isosorbide mononitrate (IMDUR) 60 MG 24 hr tablet TAKE 1 TABLET (60 MG TOTAL) BY MOUTH DAILY. 90 tablet 1  . meclizine (ANTIVERT) 25 MG tablet Take 1 tablet (25 mg total) by mouth 3 (three) times daily as needed for dizziness. (Patient not taking: Reported on 01/04/2021) 30 tablet 0  . rosuvastatin (CRESTOR) 10 MG tablet TAKE 1 TABLET (10 MG TOTAL) BY MOUTH DAILY. 90 tablet 1  . sodium chloride (OCEAN) 0.65 % SOLN nasal spray Place 2 sprays into both nostrils as needed for congestion. (Patient not taking: Reported on 01/04/2021) 30 mL 3  .  TRUEplus Lancets 28G MISC Use to check blood sugar once daily. E11.21 100 each 2   No current facility-administered medications on file prior to visit.    Allergies  Allergen Reactions  . Dust Mite Extract Cough  . Ace Inhibitors Cough  . Lovastatin Other (See Comments)    Generalized body pain    Social History   Socioeconomic History  . Marital status: Divorced    Spouse name: Not on file  . Number of children: 5  . Years of education: Not on file  . Highest education level: Master's degree (e.g., MA, MS, MEng, MEd, MSW, MBA)  Occupational History  . Occupation: Unemployed  Tobacco Use  . Smoking status: Never Smoker  . Smokeless  tobacco: Never Used  Vaping Use  . Vaping Use: Never used  Substance and Sexual Activity  . Alcohol use: No  . Drug use: No  . Sexual activity: Not Currently    Birth control/protection: None  Other Topics Concern  . Not on file  Social History Narrative   Paula [name] lives in a 75 year old apartment with no concern for water damage or mildew.  Flooring is carpet throughout.  Heating is electric and cooling is central.  There are no animals located in the home, however, there is 1 dog across the hallway.  There are no roaches in the home and there are no dust mite free covers on the bed or pillow.  She is exposed to tobacco smoke in her home.  There are no concerns for exposure to fumes, chemicals, or dust in the home.      Paient is right-handed. She avoids caffeine, she walks daily. She lives alone in a first floor apartment. She had 5 children, one died in childhood, unknown causes, another as a young adult with kidney disease, in [place].   Social Determinants of Health   Financial Resource Strain: Not on file  Food Insecurity: Not on file  Transportation Needs: Not on file  Physical Activity: Not on file  Stress: Not on file  Social Connections: Not on file  Intimate Partner Violence: Not on file    Family History  Problem Relation Age of  Onset  . Hypertension Mother   . Asthma Sister   . Colon cancer Neg Hx   . Allergic rhinitis Neg Hx   . Eczema Neg Hx     Past Surgical History:  Procedure Laterality Date  . CATARACT EXTRACTION     right eye  . IR 3D INDEPENDENT WKST  12/14/2017  . IR ANGIO INTRA EXTRACRAN SEL COM CAROTID INNOMINATE BILAT MOD SED  09/15/2017  . IR ANGIO INTRA EXTRACRAN SEL COM CAROTID INNOMINATE UNI L MOD SED  12/14/2017  . IR ANGIO VERTEBRAL SEL VERTEBRAL BILAT MOD SED  09/15/2017  . IR RADIOLOGIST EVAL & MGMT  09/10/2017  . IR RADIOLOGIST EVAL & MGMT  10/19/2017  . lymphatic mass surgery    . lymphoma mass surgery Bilateral    non cancerous  . NASAL TURBINATE REDUCTION    . RADIOLOGY WITH ANESTHESIA N/A 12/14/2017   Procedure: RADIOLOGY WITH ANESTHESIA EMBOLIZATION;  Surgeon: Luanne Bras, MD;  Location: [place];  Service: Radiology;  Laterality: N/A;  . VIDEO BRONCHOSCOPY Bilateral 11/18/2017   Procedure: VIDEO BRONCHOSCOPY WITHOUT FLUORO;  Surgeon: Collene Gobble, MD;  [facility];  Service: Cardiopulmonary;  Laterality: Bilateral;    ROS: Review of Systems Negative except as stated above  PHYSICAL EXAM: BP 130/73   Pulse 61   Resp 16   Ht 5' 2" (1.575 m)   Wt 136 lb 3.2 oz (61.8 kg)   LMP  (LMP Unknown)   SpO2 98%   BMI 24.91 kg/m   Physical Exam  General appearance - alert, well appearing, elderly female and in no distress Mental status - normal mood, behavior, speech, dress, motor activity, and thought processes Eyes -pale conjunctiva  nose -nasal mucosa is moist.  She has narrowed nasal passage on the right side. Mouth - No oral lesions.  Throat is clear.  No drainage noted at the back of the throat. Neck - supple, no significant adenopathy Chest - clear to auscultation, no wheezes, rales or rhonchi, symmetric air entry Heart - normal rate, regular rhythm, normal  S1, S2, no murmurs, rubs, clicks or gallops MSK: No tenderness on palpation of the lumbar  spine Neurological -power in both lower extremities 5/5 bilaterally.  Gross and fine sensation intact.   Extremities -no lower extremity edema    CMP Latest Ref Rng & Units 12/29/2020 09/14/2019 03/26/2019  Glucose 70 - 99 mg/dL 163(H) 115(H) 200(H)  BUN 8 - 23 mg/dL 42(H) 29(H) 22  Creatinine 0.44 - 1.00 mg/dL 3.77(H) 2.47(H) 1.73(H)  Sodium 135 - 145 mmol/L 124(L) 137 135  Potassium 3.5 - 5.1 mmol/L 4.0 4.5 3.5  Chloride 98 - 111 mmol/L 88(L) 96 99  CO2 22 - 32 mmol/L _0 Calcium 8.9 - 10.3 mg/dL 8.8(L) 9.4 8.8(L)  Total Protein 6.5 - 8.1 g/dL 7.1 6.7 6.3(L)  Total Bilirubin 0.3 - 1.2 mg/dL 0.7 0.4 0.5  Alkaline Phos 38 - 126 U/L 89 79 77  AST 15 - 41 U/L _1 ALT 0 - 44 U/L _2 Lipid Panel     Component Value Date/Time   CHOL 160 09/14/2019 0912   TRIG 96 09/14/2019 0912   HDL 55 09/14/2019 0912   CHOLHDL 2.9 09/14/2019 0912   CHOLHDL 2.9 01/25/2016 0901   VLDL 20 01/25/2016 0901   LDLCALC 87 09/14/2019 0912    CBC    Component Value Date/Time   WBC 5.3 12/29/2020 1436   RBC 3.70 (L) 12/29/2020 1436   HGB 10.9 (L) 12/29/2020 1436   HGB 12.4 09/02/2018 1211   HCT 30.5 (L) 12/29/2020 1436   HCT 37.3 09/02/2018 1211   PLT 207 12/29/2020 1436   PLT 203 09/02/2018 1211   MCV 82.4 12/29/2020 1436   MCV 87 09/02/2018 1211   MCH 29.5 12/29/2020 1436   MCHC 35.7 12/29/2020 1436   RDW 12.0 12/29/2020 1436   RDW 13.1 09/02/2018 1211   LYMPHSABS 0.9 12/29/2020 1436   LYMPHSABS 1.2 09/02/2018 1211   MONOABS 0.4 12/29/2020 1436   EOSABS 0.1 12/29/2020 1436   EOSABS 0.1 09/02/2018 1211   BASOSABS 0.0 12/29/2020 1436   BASOSABS 0.0 09/02/2018 1211    ASSESSMENT AND PLAN: 1. Controlled type 2 diabetes mellitus with chronic kidney disease, without long-term current use of insulin, unspecified CKD stage ([place]) Well-controlled off medications.  Discontinue Januvia and glipizide. Encourage healthy eating habits. Continue follow-up with nephrology.  She has  definite worsening of kidney function. - POCT glucose (manual entry) - POCT glycosylated hemoglobin (Hb A1C) - Microalbumin / creatinine urine ratio - Basic Metabolic Panel - Lipid panel  2. Hypertension associated with diabetes ([place]) Controlled on amlodipine and carvedilol  3. Sciatica, left side Continue Tylenol as needed.  We will try her with some physical therapy.  If no improvement will need advanced imaging and referral to a neurosurgeon.  Patient wants conservative treatment first so we will try the physical therapy - Ambulatory referral to Physical Therapy  4. Chronic cough She definitely has some postnasal drip which she reports has not responded to conventional treatment including steroid nasal sprays.  I advised against use of Sudafed. - Ambulatory referral to ENT  5. Encounter for screening mammogram for malignant neoplasm of breast - MM Digital Screening; Future  6. Need for COVID-19 vaccine Encouraged her to get the COVID-19 vaccine.  Advised that she is at increased risk for adverse outcome should she become infected with the COVID-19 virus  7. Normocytic anemia Likely due to CKD - Iron, TIBC and Ferritin Panel  Patient was given the opportunity to ask questions.  Patient verbalized understanding of the plan and was able to repeat key elements of the plan.   Orders Placed This Encounter  Procedures  . MM Digital Screening  . Microalbumin / creatinine urine ratio  . Basic Metabolic Panel  . Iron, TIBC and Ferritin Panel  . Lipid panel  . Ambulatory referral to Physical Therapy  . Ambulatory referral to ENT  . POCT glucose (manual entry)  . POCT glycosylated hemoglobin (Hb A1C)     Requested Prescriptions    No prescriptions requested or ordered in this encounter    Return in about 3 months (around 04/06/2021) for Give her 3 mth f/u with Dr. Margarita Rana.  Karle Plumber, MD, FACP

## 2021-01-05 LAB — LIPID PANEL
Chol/HDL Ratio: 2.2 ratio (ref 0.0–4.4)
Cholesterol, Total: 141 mg/dL (ref 100–199)
HDL: 63 mg/dL (ref >39)
LDL Chol Calc (NIH): 63 mg/dL (ref 0–99)
Triglycerides: 78 mg/dL (ref 0–149)
VLDL Cholesterol Cal: 15 mg/dL (ref 5–40)

## 2021-01-05 LAB — BASIC METABOLIC PANEL
BUN/Creatinine Ratio: 12 (ref 12–28)
BUN: 41 mg/dL — ABNORMAL HIGH (ref 8–27)
CO2: 23 mmol/L (ref 20–29)
Calcium: 8.8 mg/dL (ref 8.7–10.3)
Chloride: 91 mmol/L — ABNORMAL LOW (ref 96–106)
Creatinine, Ser: 3.4 mg/dL — ABNORMAL HIGH (ref 0.57–1.00)
Glucose: 121 mg/dL — ABNORMAL HIGH (ref 65–99)
Potassium: 4.8 mmol/L (ref 3.5–5.2)
Sodium: 132 mmol/L — ABNORMAL LOW (ref 134–144)
eGFR: 14 mL/min/{1.73_m2} — ABNORMAL LOW (ref >59)

## 2021-01-05 LAB — MICROALBUMIN / CREATININE URINE RATIO
Creatinine, Urine: 87.6 mg/dL
Microalb/Creat Ratio: 3218 mg/g creat — ABNORMAL HIGH (ref 0–29)
Microalbumin, Urine: 2819.3 ug/mL

## 2021-01-05 LAB — IRON,TIBC AND FERRITIN PANEL
Ferritin: 170 ng/mL — ABNORMAL HIGH (ref 15–150)
Iron Saturation: 16 % (ref 15–55)
Iron: 44 ug/dL (ref 27–139)
Total Iron Binding Capacity: 277 ug/dL (ref 250–450)
UIBC: 233 ug/dL (ref 118–369)

## 2021-01-09 ENCOUNTER — Other Ambulatory Visit: Payer: Self-pay

## 2021-01-09 ENCOUNTER — Other Ambulatory Visit: Payer: Self-pay | Admitting: Family Medicine

## 2021-01-09 DIAGNOSIS — I10 Essential (primary) hypertension: Secondary | ICD-10-CM

## 2021-01-09 DIAGNOSIS — E1122 Type 2 diabetes mellitus with diabetic chronic kidney disease: Secondary | ICD-10-CM

## 2021-01-09 MED ORDER — AMLODIPINE BESYLATE 10 MG PO TABS
10.0000 mg | ORAL_TABLET | Freq: Every day | ORAL | 0 refills | Status: DC
  Filled 2021-01-09: qty 30, 30d supply, fill #0

## 2021-01-09 MED ORDER — ISOSORBIDE MONONITRATE ER 60 MG PO TB24
ORAL_TABLET | Freq: Every day | ORAL | 0 refills | Status: DC
  Filled 2021-01-09: qty 90, fill #0
  Filled 2021-03-07: qty 90, 90d supply, fill #0

## 2021-01-09 MED ORDER — ROSUVASTATIN CALCIUM 10 MG PO TABS
ORAL_TABLET | Freq: Every day | ORAL | 0 refills | Status: DC
  Filled 2021-01-09: qty 90, fill #0

## 2021-01-10 ENCOUNTER — Other Ambulatory Visit: Payer: Self-pay

## 2021-01-21 ENCOUNTER — Encounter: Payer: Self-pay | Admitting: Physical Therapy

## 2021-01-21 ENCOUNTER — Other Ambulatory Visit: Payer: Self-pay

## 2021-01-21 ENCOUNTER — Ambulatory Visit: Payer: Medicare Other | Attending: Internal Medicine | Admitting: Physical Therapy

## 2021-01-21 DIAGNOSIS — M5442 Lumbago with sciatica, left side: Secondary | ICD-10-CM | POA: Diagnosis present

## 2021-01-21 DIAGNOSIS — M25562 Pain in left knee: Secondary | ICD-10-CM | POA: Insufficient documentation

## 2021-01-21 DIAGNOSIS — R2689 Other abnormalities of gait and mobility: Secondary | ICD-10-CM | POA: Diagnosis present

## 2021-01-21 DIAGNOSIS — M6281 Muscle weakness (generalized): Secondary | ICD-10-CM | POA: Diagnosis present

## 2021-01-21 DIAGNOSIS — G8929 Other chronic pain: Secondary | ICD-10-CM | POA: Insufficient documentation

## 2021-01-21 NOTE — Patient Instructions (Signed)
Access Code: [email or web address] URL: [email or web address] Date: 01/21/2021 Prepared by: Amador Cunas  Exercises Supine Lower Trunk Rotation - 1 x daily - 7 x weekly - 2 sets - 10 reps - 3-5 sec hold Supine Piriformis Stretch with Leg Straight - 1 x daily - 7 x weekly - 2 sets - 2 reps - 15 sec hold Seated Hamstring Stretch - 1 x daily - 7 x weekly - 2 sets - 2 reps - 20 sec hold Sit to Stand with Counter Support - 1 x daily - 7 x weekly - 2 sets - 5 reps Seated Long Arc Quad - 1 x daily - 7 x weekly - 2 sets - 10 reps

## 2021-01-21 NOTE — Therapy (Signed)
[place] [place], [place], [address] Phone: [phone]   Fax:  [phone]  Physical Therapy Evaluation  Patient Details  Name: ABBEE [name] MRN: [id number] Date of Birth: 1946/03/28 Referring Provider (PT): Wynetta Emery   Encounter Date: 01/21/2021   PT End of Session - 01/21/21 1054     Visit Number 1    Date for PT Re-Evaluation 04/15/21    PT Start Time 1000    PT Stop Time 1036    PT Time Calculation (min) 36 min    Activity Tolerance Patient tolerated treatment well    Behavior During Therapy [facility] for tasks assessed/performed             Past Medical History:  Diagnosis Date   Allergic rhinitis    Arthritis    Asthma    Brain aneurysm    Chronic kidney disease    Stage 3   Cough    Diabetes mellitus    Diastolic CHF, chronic ([place]) 10/11/2011   GERD (gastroesophageal reflux disease)    History of colon polyps 2012   tubular adenoma    Hyperlipidemia    Hypertension    Neuropathy 10/11/2011   PONV (postoperative nausea and vomiting)    one time after lymph node surgery    Past Surgical History:  Procedure Laterality Date   CATARACT EXTRACTION     right eye   IR 3D INDEPENDENT WKST  12/14/2017   IR ANGIO INTRA EXTRACRAN SEL COM CAROTID INNOMINATE BILAT MOD SED  09/15/2017   IR ANGIO INTRA EXTRACRAN SEL COM CAROTID INNOMINATE UNI L MOD SED  12/14/2017   IR ANGIO VERTEBRAL SEL VERTEBRAL BILAT MOD SED  09/15/2017   IR RADIOLOGIST EVAL & MGMT  09/10/2017   IR RADIOLOGIST EVAL & MGMT  10/19/2017   lymphatic mass surgery     lymphoma mass surgery Bilateral    non cancerous   NASAL TURBINATE REDUCTION     RADIOLOGY WITH ANESTHESIA N/A 12/14/2017   Procedure: RADIOLOGY WITH ANESTHESIA EMBOLIZATION;  Surgeon: Luanne Bras, MD;  Location: [place];  Service: Radiology;  Laterality: N/A;   VIDEO BRONCHOSCOPY Bilateral 11/18/2017   Procedure: VIDEO BRONCHOSCOPY WITHOUT FLUORO;  Surgeon: Collene Gobble,  MD;  [facility];  Service: Cardiopulmonary;  Laterality: Bilateral;    There were no vitals filed for this visit.    Subjective Assessment - 01/21/21 1003     Subjective Pt reports that she has been having chronic ongoing LBP that worsened following a fall ~6 mos ago. Pt states that she is now having radiating pain down posterior LLE. Has had this issue before and it was helped by physical therapy. Pt states that she has had trouble walking and exercising d/t other conditions like her BP and CKD. Pt denies N/T from back but states she does have intermittent numbness in feet d/t diabetic neuropathy.    Pertinent History DM, CKD, HTN, CHF    Limitations Standing;Walking    How long can you walk comfortably? 5-10 minutes; states limited by other medical conditions rather than LBP    Diagnostic tests xrays from 2019; no MRI    Patient Stated Goals get rid of pain; be able to lay down and get up without any pain    Currently in Pain? Yes    Pain Score 0-No pain   states 8/10 when moving   Pain Location Back    Pain Orientation Left    Pain Descriptors /  Indicators Sharp;Aching    Pain Type Chronic pain    Pain Radiating Towards posterior LLE    Pain Onset More than a month ago    Pain Frequency Intermittent    Aggravating Factors  prolonged standing, walking, lying on L side    Pain Relieving Factors rest, tylenol, heating pad                [facility] PT Assessment - 01/21/21 0001       Assessment   Medical Diagnosis LBP with LLE radiating pain    Referring Provider (PT) Wynetta Emery    Hand Dominance Right    Next MD Visit --   August 2022   Prior Therapy PT for LBP      Precautions   Precautions None      Restrictions   Weight Bearing Restrictions No      Balance Screen   Has the patient fallen in the past 6 months Yes    How many times? 2    Has the patient had a decrease in activity level because of a fear of falling?  No    Is the patient reluctant to leave  their home because of a fear of falling?  No      Home Environment   Additional Comments lives on the second floor, has rails; reports some difficulty with stairs      Prior Function   Level of Independence Independent    Vocation Retired      Functional Tests   Functional tests Sit to [employer] to Stand   Comments from elevated mat table; reports some knee pain/instability      ROM / Strength   AROM / PROM / Strength AROM;Strength      AROM   Overall AROM Comments lumbar AROM limited 50% + pain      Strength   Overall Strength Comments RLE 4+/5, LLE 4-/5      Flexibility   Soft Tissue Assessment /Muscle Length yes    Hamstrings tight B L>R    ITB tight    Piriformis tight      Palpation   Palpation comment tender to palpation L lumbar paraspinals      Transfers   Five time sit to stand comments  34.23 sec from raised mat table with UE support      Ambulation/Gait   Gait Comments mild antalgic gait with shortened step length B                        Objective measurements completed on examination: See above findings.               PT Education - 01/21/21 1030     Education Details Pt educated on POC and HEP    Person(s) Educated Patient    Methods Explanation;Demonstration;Handout    Comprehension Verbalized understanding;Returned demonstration              PT Short Term Goals - 01/21/21 1102       PT SHORT TERM GOAL #1   Title Pt will I with initial HEP    Time 2    Period Weeks    Status New    Target Date 02/04/21      PT SHORT TERM GOAL #2   Period Weeks    Status New               PT Long Term  Goals - 01/21/21 1103       PT LONG TERM GOAL #1   Title Patient will increase left hip flexion and abduction to 4+/5    Time 8    Period Weeks    Status New    Target Date 03/18/21      PT LONG TERM GOAL #2   Title Pt will be I with advanced HEP    Time 8    Period Weeks    Status New    Target Date  03/18/21      PT LONG TERM GOAL #3   Title Pt will demo lumbar AROM <25% limited    Time 8    Period Weeks    Status New    Target Date 03/18/21      PT LONG TERM GOAL #4   Title Pt will report 50% reduction in LBP    Time 8    Period Weeks    Status New    Target Date 03/18/21      PT LONG TERM GOAL #5   Title Pt will report resoution of RLE radiating pain    Time 8    Period Weeks    Status New    Target Date 03/18/21                    Plan - 01/21/21 1055     Clinical Impression Statement Pt presents to clinic with reports of chronic LBP with radiating LLE pain posteriorly. Chronic pain worsening in severity following fall ~6 months ago. Pt reports 1 additional fall since then; states instances of knee instability leading to fall. Xrays from 2019 demo mild degenerative changes, no MRI. Pt has several comorbid conditions limiting endurance and capaciy for exercise. Pt demos limited and painful lumbar AROM, BLE flexibility deficits, functional LE weakness, and fall risk per 5x STS. Pt reports difficulty with stairs and lives on second floor apartment; uses railing to access. Pt would benefit from skilled PT to address functional LE weakness, LBP, and fall risk.    Personal Factors and Comorbidities Comorbidity 3+    Comorbidities DM, CKD, HTN, CHF    Examination-Activity Limitations Bend;Toileting;Locomotion Level;Stairs    Stability/Clinical Decision Making Evolving/Moderate complexity    Clinical Decision Making Moderate    Rehab Potential Good    PT Frequency 1x / week   per pt request d/t multiple other appts   PT Duration 8 weeks    PT Treatment/Interventions ADLs/Self Care Home Management;Electrical Stimulation;Moist Heat;Neuromuscular re-education;Balance training;Therapeutic exercise;Therapeutic activities;Stair training;Patient/family education;Manual techniques;Passive range of motion    PT Next Visit Plan review/progress HEP, gentle progression to TE,  lumbar/core stab/flexibility, functional LE strengthening    PT Home Exercise Plan see pt instructions    Consulted and Agree with Plan of Care Patient             Patient will benefit from skilled therapeutic intervention in order to improve the following deficits and impairments:  Abnormal gait, Decreased range of motion, Difficulty walking, Decreased endurance, Increased muscle spasms, Decreased activity tolerance, Pain, Decreased balance, Hypomobility, Impaired flexibility, Decreased strength  Visit Diagnosis: Chronic left-sided low back pain with left-sided sciatica  Chronic pain of left knee  Other abnormalities of gait and mobility  Muscle weakness (generalized)     Problem List Patient Active Problem List   Diagnosis Date Noted   Upper airway cough syndrome 10/12/2017   Chronic rhinitis 10/01/2017   Seasonal allergic conjunctivitis 10/01/2017   Aneurysm ([place])  [id number]   Acute metabolic encephalopathy [id number]   URI (upper respiratory infection) 09/02/2017   Facial weakness 09/02/2017   Benign essential HTN 09/02/2017   Allergic rhinitis 08/20/2017   Chronic pain 05/07/2017   Palpitations 07/10/2016   Insomnia 03/25/2016   Gout 01/23/2016   History of cataract 02/27/2015   Seasonal and perennial allergic rhinitis 02/15/2012   Cough 11/04/2011   Diabetic neuropathy ([place]) 10/11/2011   Chronic diastolic congestive heart failure ([place]) 10/11/2011   CKD (chronic kidney disease) stage 3, GFR 30-59 ml/min ([place]) 10/10/2011   GERD (gastroesophageal reflux disease)    Type 2 diabetes mellitus without complication, without long-term current use of insulin ([place]) 07/03/2010   Hyperlipidemia 07/03/2010   Essential hypertension 07/03/2010   Amador Cunas, PT, DPT [facility] [place] 01/21/2021, 11:05 AM  [place] [place]. [place], [place], [address] Phone: [phone]   Fax:  [phone]  Name: Paula [name] MRN: [id number] Date of Birth: [birth year]

## 2021-01-30 ENCOUNTER — Ambulatory Visit: Payer: Medicare Other | Admitting: Physical Therapy

## 2021-01-30 ENCOUNTER — Other Ambulatory Visit: Payer: Self-pay

## 2021-01-30 DIAGNOSIS — M5442 Lumbago with sciatica, left side: Secondary | ICD-10-CM | POA: Diagnosis not present

## 2021-01-30 DIAGNOSIS — G8929 Other chronic pain: Secondary | ICD-10-CM

## 2021-01-30 DIAGNOSIS — M6281 Muscle weakness (generalized): Secondary | ICD-10-CM

## 2021-01-30 NOTE — Therapy (Signed)
[place]. [place], [place], [address] Phone: [phone]   Fax:  [phone]  Physical Therapy Treatment  Patient Details  Name: Paula [name] MRN: [id number] Date of Birth: 08-02-1946 Referring Provider (PT): Wynetta Emery   Encounter Date: 01/30/2021   PT End of Session - 01/30/21 1052     Visit Number 2    Date for PT Re-Evaluation 04/15/21    PT Start Time 0349    PT Stop Time 1105    PT Time Calculation (min) 50 min             Past Medical History:  Diagnosis Date   Allergic rhinitis    Arthritis    Asthma    Brain aneurysm    Chronic kidney disease    Stage 3   Cough    Diabetes mellitus    Diastolic CHF, chronic ([place]) 10/11/2011   GERD (gastroesophageal reflux disease)    History of colon polyps 2012   tubular adenoma    Hyperlipidemia    Hypertension    Neuropathy 10/11/2011   PONV (postoperative nausea and vomiting)    one time after lymph node surgery    Past Surgical History:  Procedure Laterality Date   CATARACT EXTRACTION     right eye   IR 3D INDEPENDENT WKST  12/14/2017   IR ANGIO INTRA EXTRACRAN SEL COM CAROTID INNOMINATE BILAT MOD SED  09/15/2017   IR ANGIO INTRA EXTRACRAN SEL COM CAROTID INNOMINATE UNI L MOD SED  12/14/2017   IR ANGIO VERTEBRAL SEL VERTEBRAL BILAT MOD SED  09/15/2017   IR RADIOLOGIST EVAL & MGMT  09/10/2017   IR RADIOLOGIST EVAL & MGMT  10/19/2017   lymphatic mass surgery     lymphoma mass surgery Bilateral    non cancerous   NASAL TURBINATE REDUCTION     RADIOLOGY WITH ANESTHESIA N/A 12/14/2017   Procedure: RADIOLOGY WITH ANESTHESIA EMBOLIZATION;  Surgeon: Luanne Bras, MD;  Location: [facility];  Service: Radiology;  Laterality: N/A;   VIDEO BRONCHOSCOPY Bilateral 11/18/2017   Procedure: VIDEO BRONCHOSCOPY WITHOUT FLUORO;  Surgeon: Collene Gobble, MD;  Location[facility];  Service: Cardiopulmonary;  Laterality: Bilateral;    There were no vitals filed for  this visit.   Subjective Assessment - 01/30/21 1018     Subjective ex at home are okay, help leg but at times make back worse    Currently in Pain? Yes    Pain Score 7     Pain Location Hip    Pain Orientation Left                               [facility] Adult PT Treatment/Exercise - 01/30/21 0001       Exercises   Exercises Knee/Hip;Lumbar      Lumbar Exercises: Aerobic   Nustep L 4 5 min      Lumbar Exercises: Seated   Other Seated Lumbar Exercises pelvic ROM 4 way on dynadisc 10x ea   LAQ,hip flex and abd 10 each,scap stab 3 way 10 each red tband     Lumbar Exercises: Supine   Ab Set 10 reps;3 seconds   with ball   Bridge Non-compliant;10 reps;3 seconds   plus KTC and obl with feet on ball     Knee/Hip Exercises: Standing   Hip Abduction Stengthening;Both;10 reps   UE support   Hip Extension Stengthening;Both;10 reps   UE  support     Modalities   Modalities Moist Heat      Moist Heat Therapy   Number Minutes Moist Heat 10 Minutes    Moist Heat Location Hip   left at end of session     Manual Therapy   Manual Therapy Passive ROM;Soft tissue mobilization    Manual therapy comments LLE HS and IT very tight    Soft tissue mobilization left lateral hip tightness with stretch    Passive ROM LE and trunk                      PT Short Term Goals - 01/30/21 1052       PT SHORT TERM GOAL #1   Title Pt will I with initial HEP    Status Achieved               PT Long Term Goals - 01/21/21 1103       PT LONG TERM GOAL #1   Title Patient will increase left hip flexion and abduction to 4+/5    Time 8    Period Weeks    Status New    Target Date 03/18/21      PT LONG TERM GOAL #2   Title Pt will be I with advanced HEP    Time 8    Period Weeks    Status New    Target Date 03/18/21      PT LONG TERM GOAL #3   Title Pt will demo lumbar AROM <25% limited    Time 8    Period Weeks    Status New    Target Date 03/18/21       PT LONG TERM GOAL #4   Title Pt will report 50% reduction in LBP    Time 8    Period Weeks    Status New    Target Date 03/18/21      PT LONG TERM GOAL #5   Title Pt will report resoution of RLE radiating pain    Time 8    Period Weeks    Status New    Target Date 03/18/21                   Plan - 01/30/21 1052     Clinical Impression Statement STG met. Pt tolerated initial progression of ther ex well with cuing ( verb and tactile). Tightness noted in LLE esp HS And IT but responded well to STW. Pain after ex and strteching 5/10 and followed with MH for 10 min    PT Treatment/Interventions ADLs/Self Care Home Management;Electrical Stimulation;Moist Heat;Neuromuscular re-education;Balance training;Therapeutic exercise;Therapeutic activities;Stair training;Patient/family education;Manual techniques;Passive range of motion    PT Next Visit Plan progress HEP, gentle progression to TE, lumbar/core stab/flexibility, functional LE strengthening             Patient will benefit from skilled therapeutic intervention in order to improve the following deficits and impairments:  Abnormal gait, Decreased range of motion, Difficulty walking, Decreased endurance, Increased muscle spasms, Decreased activity tolerance, Pain, Decreased balance, Hypomobility, Impaired flexibility, Decreased strength  Visit Diagnosis: Chronic left-sided low back pain with left-sided sciatica  Muscle weakness (generalized)     Problem List Patient Active Problem List   Diagnosis Date Noted   Upper airway cough syndrome 10/12/2017   Chronic rhinitis 10/01/2017   Seasonal allergic conjunctivitis 10/01/2017   Aneurysm ([place]) 22/63/3354   Acute metabolic encephalopathy 56/25/6389   URI (upper respiratory infection)  09/02/2017   Facial weakness 09/02/2017   Benign essential HTN 09/02/2017   Allergic rhinitis 08/20/2017   Chronic pain 05/07/2017   Palpitations 07/10/2016   Insomnia 03/25/2016    Gout 01/23/2016   History of cataract 02/27/2015   Seasonal and perennial allergic rhinitis 02/15/2012   Cough 11/04/2011   Diabetic neuropathy ([place]) 10/11/2011   Chronic diastolic congestive heart failure ([facility]) 10/11/2011   CKD (chronic kidney disease) stage 3, GFR 30-59 ml/min ([facility]) 10/10/2011   GERD (gastroesophageal reflux disease)    Type 2 diabetes mellitus without complication, without long-term current use of insulin ([place]) 07/03/2010   Hyperlipidemia 07/03/2010   Essential hypertension 07/03/2010    Rannie Craney,ANGIE PTA 01/30/2021, 10:55 AM  [place]. [place], [place], [address] Phone: [phone]   Fax:  [phone]  Name: Paula [name] MRN: [id number] Date of Birth: [birth year]

## 2021-02-04 ENCOUNTER — Ambulatory Visit: Payer: Medicare Other | Admitting: Physical Therapy

## 2021-02-04 ENCOUNTER — Other Ambulatory Visit: Payer: Self-pay

## 2021-02-04 ENCOUNTER — Encounter: Payer: Self-pay | Admitting: Physical Therapy

## 2021-02-04 DIAGNOSIS — M6281 Muscle weakness (generalized): Secondary | ICD-10-CM

## 2021-02-04 DIAGNOSIS — R2689 Other abnormalities of gait and mobility: Secondary | ICD-10-CM

## 2021-02-04 DIAGNOSIS — G8929 Other chronic pain: Secondary | ICD-10-CM

## 2021-02-04 DIAGNOSIS — M5442 Lumbago with sciatica, left side: Secondary | ICD-10-CM | POA: Diagnosis not present

## 2021-02-04 NOTE — Therapy (Signed)
[place]. [place], [place], [address] Phone: [phone]   Fax:  [phone]  Physical Therapy Treatment  Patient Details  Name: Paula [name] MRN: [id number] Date of Birth: June 23, 1946 Referring Provider (PT): Wynetta Emery   Encounter Date: 02/04/2021   PT End of Session - 02/04/21 1213     Visit Number 3    Date for PT Re-Evaluation 04/15/21    PT Start Time 1129    PT Stop Time 1212    PT Time Calculation (min) 43 min    Activity Tolerance Patient tolerated treatment well    Behavior During Therapy [facility] for tasks assessed/performed             Past Medical History:  Diagnosis Date   Allergic rhinitis    Arthritis    Asthma    Brain aneurysm    Chronic kidney disease    Stage 3   Cough    Diabetes mellitus    Diastolic CHF, chronic ([place]) 10/11/2011   GERD (gastroesophageal reflux disease)    History of colon polyps 2012   tubular adenoma    Hyperlipidemia    Hypertension    Neuropathy 10/11/2011   PONV (postoperative nausea and vomiting)    one time after lymph node surgery    Past Surgical History:  Procedure Laterality Date   CATARACT EXTRACTION     right eye   IR 3D INDEPENDENT WKST  12/14/2017   IR ANGIO INTRA EXTRACRAN SEL COM CAROTID INNOMINATE BILAT MOD SED  09/15/2017   IR ANGIO INTRA EXTRACRAN SEL COM CAROTID INNOMINATE UNI L MOD SED  12/14/2017   IR ANGIO VERTEBRAL SEL VERTEBRAL BILAT MOD SED  09/15/2017   IR RADIOLOGIST EVAL & MGMT  09/10/2017   IR RADIOLOGIST EVAL & MGMT  10/19/2017   lymphatic mass surgery     lymphoma mass surgery Bilateral    non cancerous   NASAL TURBINATE REDUCTION     RADIOLOGY WITH ANESTHESIA N/A 12/14/2017   Procedure: RADIOLOGY WITH ANESTHESIA EMBOLIZATION;  Surgeon: Luanne Bras, MD;  Location: [facility];  Service: Radiology;  Laterality: N/A;   VIDEO BRONCHOSCOPY Bilateral 11/18/2017   Procedure: VIDEO BRONCHOSCOPY WITHOUT FLUORO;  Surgeon: Collene Gobble,  MD;  [facility];  Service: Cardiopulmonary;  Laterality: Bilateral;    There were no vitals filed for this visit.   Subjective Assessment - 02/04/21 1146     Subjective Pt reports she was very sore after last treatment; doing better today.    Currently in Pain? Yes    Pain Score 3     Pain Location Hip    Pain Orientation Left                               [facility] Adult PT Treatment/Exercise - 02/04/21 0001       Lumbar Exercises: Stretches   Gastroc Stretch Right;Left;1 rep;30 seconds      Lumbar Exercises: Standing   Heel Raises 15 reps    Other Standing Lumbar Exercises 2x10 standing hip abd/ext/flex 2.5#      Lumbar Exercises: Seated   Long Arc Quad on Chair Both;2 sets;10 reps    LAQ on Chair Limitations 2.5#    Sit to Stand 20 reps    Sit to Stand Limitations 2x10 holding small ball to discourage use of UEs; cues for eccentric control    Other Seated Lumbar Exercises pelvic ROM  4 way on dynadisc 10x ea; iso abs x15 3' hold    Other Seated Lumbar Exercises marching 2x10 alt LEs 2.5#; ball squeeze x15 3' hold                      PT Short Term Goals - 01/30/21 1052       PT SHORT TERM GOAL #1   Title Pt will I with initial HEP    Status Achieved               PT Long Term Goals - 01/21/21 1103       PT LONG TERM GOAL #1   Title Patient will increase left hip flexion and abduction to 4+/5    Time 8    Period Weeks    Status New    Target Date 03/18/21      PT LONG TERM GOAL #2   Title Pt will be I with advanced HEP    Time 8    Period Weeks    Status New    Target Date 03/18/21      PT LONG TERM GOAL #3   Title Pt will demo lumbar AROM <25% limited    Time 8    Period Weeks    Status New    Target Date 03/18/21      PT LONG TERM GOAL #4   Title Pt will report 50% reduction in LBP    Time 8    Period Weeks    Status New    Target Date 03/18/21      PT LONG TERM GOAL #5   Title Pt will report  resoution of RLE radiating pain    Time 8    Period Weeks    Status New    Target Date 03/18/21                   Plan - 02/04/21 1213     Clinical Impression Statement Pt tolerated progression of TE well. No increase in pain reported this rx. States relief with pelvic ROM ex's. Cues for form with standing hip ex's to avoid compensation. Education on continuance of HEP.    PT Treatment/Interventions ADLs/Self Care Home Management;Electrical Stimulation;Moist Heat;Neuromuscular re-education;Balance training;Therapeutic exercise;Therapeutic activities;Stair training;Patient/family education;Manual techniques;Passive range of motion    PT Next Visit Plan progress HEP, gentle progression to TE, lumbar/core stab/flexibility, functional LE strengthening    Consulted and Agree with Plan of Care Patient             Patient will benefit from skilled therapeutic intervention in order to improve the following deficits and impairments:  Abnormal gait, Decreased range of motion, Difficulty walking, Decreased endurance, Increased muscle spasms, Decreased activity tolerance, Pain, Decreased balance, Hypomobility, Impaired flexibility, Decreased strength  Visit Diagnosis: Chronic left-sided low back pain with left-sided sciatica  Muscle weakness (generalized)  Chronic pain of left knee  Other abnormalities of gait and mobility     Problem List Patient Active Problem List   Diagnosis Date Noted   Upper airway cough syndrome 10/12/2017   Chronic rhinitis 10/01/2017   Seasonal allergic conjunctivitis 10/01/2017   Aneurysm ([place]) [id number]   Acute metabolic encephalopathy [id number]   URI (upper respiratory infection) 09/02/2017   Facial weakness 09/02/2017   Benign essential HTN 09/02/2017   Allergic rhinitis 08/20/2017   Chronic pain 05/07/2017   Palpitations 07/10/2016   Insomnia 03/25/2016   Gout 01/23/2016   History of cataract 02/27/2015   Seasonal  and perennial  allergic rhinitis 02/15/2012   Cough 11/04/2011   Diabetic neuropathy ([place]) 10/11/2011   Chronic diastolic congestive heart failure ([place]) 10/11/2011   CKD (chronic kidney disease) stage 3, GFR 30-59 ml/min ([facility]) 10/10/2011   GERD (gastroesophageal reflux disease)    Type 2 diabetes mellitus without complication, without long-term current use of insulin ([place]) 07/03/2010   Hyperlipidemia 07/03/2010   Essential hypertension 07/03/2010   Amador Cunas, PT, DPT Donald Prose Abigale Dorow 02/04/2021, 12:15 PM  [place]. [place], [place], [address] Phone: [phone]   Fax:  [phone]  Name: Paula [name] MRN: [id number] Date of Birth: 02-27-1946

## 2021-02-05 ENCOUNTER — Telehealth: Payer: Self-pay | Admitting: Family Medicine

## 2021-02-05 ENCOUNTER — Other Ambulatory Visit: Payer: Self-pay

## 2021-02-05 NOTE — Telephone Encounter (Signed)
Copied from [facility] [id number]. Topic: General - Inquiry >> Feb 05, 2021  2:35 PM Greggory Keen D wrote: Reason for CRM: Pt called asking if can get a referral to a nutritionist ASAP.  She said her diabetes med makes glucose drop really low and she is losing weight CB  [phone]

## 2021-02-06 ENCOUNTER — Ambulatory Visit (INDEPENDENT_AMBULATORY_CARE_PROVIDER_SITE_OTHER): Payer: Medicare Other | Admitting: Otolaryngology

## 2021-02-12 NOTE — Telephone Encounter (Signed)
Pt requesting referral.

## 2021-02-12 NOTE — Telephone Encounter (Signed)
Patient has been scheduled with Lurena Joiner

## 2021-02-12 NOTE — Telephone Encounter (Signed)
Paula [name] is not supposed to be on any medication for her diabetes.  At her last visit with Dr. Wynetta Emery on 01/04/2021 her diabetic medications were discontinued.  Please have her scheduled with Lurena Joiner and bring in all her medications for evaluation.  Thanks

## 2021-02-12 NOTE — Telephone Encounter (Signed)
Please call patient and schedule her with luke for medication management.

## 2021-02-13 ENCOUNTER — Other Ambulatory Visit: Payer: Self-pay

## 2021-02-13 ENCOUNTER — Ambulatory Visit: Payer: Medicare Other | Attending: Internal Medicine | Admitting: Physical Therapy

## 2021-02-13 ENCOUNTER — Encounter: Payer: Self-pay | Admitting: Physical Therapy

## 2021-02-13 DIAGNOSIS — R2689 Other abnormalities of gait and mobility: Secondary | ICD-10-CM | POA: Diagnosis present

## 2021-02-13 DIAGNOSIS — G8929 Other chronic pain: Secondary | ICD-10-CM | POA: Diagnosis present

## 2021-02-13 DIAGNOSIS — M6281 Muscle weakness (generalized): Secondary | ICD-10-CM | POA: Diagnosis present

## 2021-02-13 DIAGNOSIS — M25562 Pain in left knee: Secondary | ICD-10-CM | POA: Insufficient documentation

## 2021-02-13 DIAGNOSIS — M5442 Lumbago with sciatica, left side: Secondary | ICD-10-CM | POA: Diagnosis present

## 2021-02-13 NOTE — Therapy (Signed)
[place]. [place], [place], [address] Phone: [phone]   Fax:  [phone]  Physical Therapy Treatment  Patient Details  Name: Paula [name] MRN: [id number] Date of Birth: 1946-06-21 Referring Provider (PT): Wynetta Emery   Encounter Date: 02/13/2021   PT End of Session - 02/13/21 1301     Visit Number 4    Date for PT Re-Evaluation 04/15/21    PT Start Time 1130    PT Stop Time 1211    PT Time Calculation (min) 41 min    Activity Tolerance Patient tolerated treatment well    Behavior During Therapy [facility] for tasks assessed/performed             Past Medical History:  Diagnosis Date   Allergic rhinitis    Arthritis    Asthma    Brain aneurysm    Chronic kidney disease    Stage 3   Cough    Diabetes mellitus    Diastolic CHF, chronic ([place]) 10/11/2011   GERD (gastroesophageal reflux disease)    History of colon polyps 2012   tubular adenoma    Hyperlipidemia    Hypertension    Neuropathy 10/11/2011   PONV (postoperative nausea and vomiting)    one time after lymph node surgery    Past Surgical History:  Procedure Laterality Date   CATARACT EXTRACTION     right eye   IR 3D INDEPENDENT WKST  12/14/2017   IR ANGIO INTRA EXTRACRAN SEL COM CAROTID INNOMINATE BILAT MOD SED  09/15/2017   IR ANGIO INTRA EXTRACRAN SEL COM CAROTID INNOMINATE UNI L MOD SED  12/14/2017   IR ANGIO VERTEBRAL SEL VERTEBRAL BILAT MOD SED  09/15/2017   IR RADIOLOGIST EVAL & MGMT  09/10/2017   IR RADIOLOGIST EVAL & MGMT  10/19/2017   lymphatic mass surgery     lymphoma mass surgery Bilateral    non cancerous   NASAL TURBINATE REDUCTION     RADIOLOGY WITH ANESTHESIA N/A 12/14/2017   Procedure: RADIOLOGY WITH ANESTHESIA EMBOLIZATION;  Surgeon: Luanne Bras, MD;  Location: [facility];  Service: Radiology;  Laterality: N/A;   VIDEO BRONCHOSCOPY Bilateral 11/18/2017   Procedure: VIDEO BRONCHOSCOPY WITHOUT FLUORO;  Surgeon: Collene Gobble,  MD;  [facility];  Service: Cardiopulmonary;  Laterality: Bilateral;    There were no vitals filed for this visit.   Subjective Assessment - 02/13/21 1139     Subjective Pt states overall improvement in LBP/hip pain with mild pain rated 1/10 today.    Currently in Pain? Yes    Pain Score 1     Pain Location Hip    Pain Orientation Left                               [facility] Adult PT Treatment/Exercise - 02/13/21 0001       Lumbar Exercises: Aerobic   UBE (Upper Arm Bike) L 1.5 x 2 min each direction    Nustep L5 x 6 min      Lumbar Exercises: Seated   Long Arc Quad on Chair Both;2 sets;10 reps    LAQ on Chair Weights (lbs) 3    Sit to Stand 20 reps    Sit to Stand Limitations 2x10 holding small ball to discourage use of UEs; cues for eccentric control    Other Seated Lumbar Exercises ball squeeze x15 3 sec hold    Other Seated Lumbar  Exercises marching 2x10 alt LEs 3# 2x10                      PT Short Term Goals - 01/30/21 1052       PT SHORT TERM GOAL #1   Title Pt will I with initial HEP    Status Achieved               PT Long Term Goals - 01/21/21 1103       PT LONG TERM GOAL #1   Title Patient will increase left hip flexion and abduction to 4+/5    Time 8    Period Weeks    Status New    Target Date 03/18/21      PT LONG TERM GOAL #2   Title Pt will be I with advanced HEP    Time 8    Period Weeks    Status New    Target Date 03/18/21      PT LONG TERM GOAL #3   Title Pt will demo lumbar AROM <25% limited    Time 8    Period Weeks    Status New    Target Date 03/18/21      PT LONG TERM GOAL #4   Title Pt will report 50% reduction in LBP    Time 8    Period Weeks    Status New    Target Date 03/18/21      PT LONG TERM GOAL #5   Title Pt will report resoution of RLE radiating pain    Time 8    Period Weeks    Status New    Target Date 03/18/21                   Plan - 02/13/21  1301     Clinical Impression Statement Pt demos need for increased rest break this rx d/t increased difficulty with chronic cough. Tolerated seated TE well with no c/o increased pain. Reporting improvement in symptoms overall with decreased pain. Continue to progress to tolerance.    PT Treatment/Interventions ADLs/Self Care Home Management;Electrical Stimulation;Moist Heat;Neuromuscular re-education;Balance training;Therapeutic exercise;Therapeutic activities;Stair training;Patient/family education;Manual techniques;Passive range of motion    PT Next Visit Plan progress HEP, gentle progression to TE, lumbar/core stab/flexibility, functional LE strengthening    Consulted and Agree with Plan of Care Patient             Patient will benefit from skilled therapeutic intervention in order to improve the following deficits and impairments:  Abnormal gait, Decreased range of motion, Difficulty walking, Decreased endurance, Increased muscle spasms, Decreased activity tolerance, Pain, Decreased balance, Hypomobility, Impaired flexibility, Decreased strength  Visit Diagnosis: Chronic left-sided low back pain with left-sided sciatica  Muscle weakness (generalized)  Chronic pain of left knee  Other abnormalities of gait and mobility     Problem List Patient Active Problem List   Diagnosis Date Noted   Upper airway cough syndrome 10/12/2017   Chronic rhinitis 10/01/2017   Seasonal allergic conjunctivitis 10/01/2017   Aneurysm ([place]) [id number]   Acute metabolic encephalopathy [id number]   URI (upper respiratory infection) 09/02/2017   Facial weakness 09/02/2017   Benign essential HTN 09/02/2017   Allergic rhinitis 08/20/2017   Chronic pain 05/07/2017   Palpitations 07/10/2016   Insomnia 03/25/2016   Gout 01/23/2016   History of cataract 02/27/2015   Seasonal and perennial allergic rhinitis 02/15/2012   Cough 11/04/2011   Diabetic neuropathy ([place]) 10/11/2011   Chronic  diastolic  congestive heart failure ([place]) 10/11/2011   CKD (chronic kidney disease) stage 3, GFR 30-59 ml/min ([facility]) 10/10/2011   GERD (gastroesophageal reflux disease)    Type 2 diabetes mellitus without complication, without long-term current use of insulin ([place]) 07/03/2010   Hyperlipidemia 07/03/2010   Essential hypertension 07/03/2010   Amador Cunas, PT, DPT Donald Prose Johnluke Haugen 02/13/2021, 1:03 PM  [place]. [place], [place], [address] Phone: [phone]   Fax:  [phone]  Name: BRAILY HENNIS MRN: [id number] Date of Birth: Oct 25, 1945

## 2021-02-19 ENCOUNTER — Ambulatory Visit: Payer: Medicare Other | Admitting: Physical Therapy

## 2021-02-19 ENCOUNTER — Ambulatory Visit: Payer: Medicare Other | Admitting: Pharmacist

## 2021-02-21 ENCOUNTER — Inpatient Hospital Stay (HOSPITAL_COMMUNITY)
Admission: EM | Admit: 2021-02-21 | Discharge: 2021-02-23 | DRG: 305 | Disposition: A | Discharge status: Home Health | Payer: Medicaid Other | Attending: Family Medicine | Admitting: Family Medicine

## 2021-02-21 ENCOUNTER — Other Ambulatory Visit: Payer: Self-pay

## 2021-02-21 ENCOUNTER — Encounter (HOSPITAL_COMMUNITY): Payer: Self-pay | Admitting: *Deleted

## 2021-02-21 ENCOUNTER — Observation Stay (HOSPITAL_COMMUNITY): Payer: Medicaid Other

## 2021-02-21 ENCOUNTER — Emergency Department (HOSPITAL_COMMUNITY): Payer: Medicaid Other

## 2021-02-21 DIAGNOSIS — R519 Headache, unspecified: Secondary | ICD-10-CM

## 2021-02-21 DIAGNOSIS — G4700 Insomnia, unspecified: Secondary | ICD-10-CM | POA: Diagnosis present

## 2021-02-21 DIAGNOSIS — R079 Chest pain, unspecified: Secondary | ICD-10-CM | POA: Diagnosis present

## 2021-02-21 DIAGNOSIS — R269 Unspecified abnormalities of gait and mobility: Secondary | ICD-10-CM

## 2021-02-21 DIAGNOSIS — Z79899 Other long term (current) drug therapy: Secondary | ICD-10-CM

## 2021-02-21 DIAGNOSIS — I160 Hypertensive urgency: Secondary | ICD-10-CM | POA: Diagnosis present

## 2021-02-21 DIAGNOSIS — I5032 Chronic diastolic (congestive) heart failure: Secondary | ICD-10-CM | POA: Diagnosis present

## 2021-02-21 DIAGNOSIS — N184 Chronic kidney disease, stage 4 (severe): Secondary | ICD-10-CM | POA: Diagnosis present

## 2021-02-21 DIAGNOSIS — R0789 Other chest pain: Secondary | ICD-10-CM | POA: Diagnosis present

## 2021-02-21 DIAGNOSIS — N179 Acute kidney failure, unspecified: Secondary | ICD-10-CM | POA: Diagnosis present

## 2021-02-21 DIAGNOSIS — R778 Other specified abnormalities of plasma proteins: Secondary | ICD-10-CM

## 2021-02-21 DIAGNOSIS — E1122 Type 2 diabetes mellitus with diabetic chronic kidney disease: Secondary | ICD-10-CM | POA: Diagnosis present

## 2021-02-21 DIAGNOSIS — Z825 Family history of asthma and other chronic lower respiratory diseases: Secondary | ICD-10-CM

## 2021-02-21 DIAGNOSIS — N189 Chronic kidney disease, unspecified: Secondary | ICD-10-CM

## 2021-02-21 DIAGNOSIS — I671 Cerebral aneurysm, nonruptured: Secondary | ICD-10-CM | POA: Diagnosis present

## 2021-02-21 DIAGNOSIS — I440 Atrioventricular block, first degree: Secondary | ICD-10-CM | POA: Diagnosis present

## 2021-02-21 DIAGNOSIS — I771 Stricture of artery: Secondary | ICD-10-CM

## 2021-02-21 DIAGNOSIS — R072 Precordial pain: Secondary | ICD-10-CM

## 2021-02-21 DIAGNOSIS — D631 Anemia in chronic kidney disease: Secondary | ICD-10-CM | POA: Diagnosis present

## 2021-02-21 DIAGNOSIS — I5033 Acute on chronic diastolic (congestive) heart failure: Secondary | ICD-10-CM | POA: Diagnosis present

## 2021-02-21 DIAGNOSIS — R053 Chronic cough: Secondary | ICD-10-CM

## 2021-02-21 DIAGNOSIS — I130 Hypertensive heart and chronic kidney disease with heart failure and stage 1 through stage 4 chronic kidney disease, or unspecified chronic kidney disease: Secondary | ICD-10-CM | POA: Diagnosis present

## 2021-02-21 DIAGNOSIS — R7989 Other specified abnormal findings of blood chemistry: Secondary | ICD-10-CM | POA: Diagnosis present

## 2021-02-21 DIAGNOSIS — K551 Chronic vascular disorders of intestine: Secondary | ICD-10-CM | POA: Diagnosis present

## 2021-02-21 DIAGNOSIS — I951 Orthostatic hypotension: Secondary | ICD-10-CM | POA: Diagnosis present

## 2021-02-21 DIAGNOSIS — Z8249 Family history of ischemic heart disease and other diseases of the circulatory system: Secondary | ICD-10-CM

## 2021-02-21 DIAGNOSIS — R6884 Jaw pain: Secondary | ICD-10-CM | POA: Diagnosis present

## 2021-02-21 DIAGNOSIS — G4489 Other headache syndrome: Secondary | ICD-10-CM

## 2021-02-21 DIAGNOSIS — E785 Hyperlipidemia, unspecified: Secondary | ICD-10-CM | POA: Diagnosis present

## 2021-02-21 DIAGNOSIS — I161 Hypertensive emergency: Principal | ICD-10-CM | POA: Diagnosis present

## 2021-02-21 DIAGNOSIS — R058 Other specified cough: Secondary | ICD-10-CM | POA: Diagnosis present

## 2021-02-21 DIAGNOSIS — I774 Celiac artery compression syndrome: Secondary | ICD-10-CM

## 2021-02-21 DIAGNOSIS — Z7982 Long term (current) use of aspirin: Secondary | ICD-10-CM

## 2021-02-21 DIAGNOSIS — Z20822 Contact with and (suspected) exposure to covid-19: Secondary | ICD-10-CM | POA: Diagnosis present

## 2021-02-21 DIAGNOSIS — R55 Syncope and collapse: Secondary | ICD-10-CM | POA: Diagnosis present

## 2021-02-21 DIAGNOSIS — I10 Essential (primary) hypertension: Secondary | ICD-10-CM

## 2021-02-21 LAB — TROPONIN I (HIGH SENSITIVITY)
Troponin I (High Sensitivity): 18 ng/L — ABNORMAL HIGH (ref <18)
Troponin I (High Sensitivity): 37 ng/L — ABNORMAL HIGH (ref <18)

## 2021-02-21 LAB — BASIC METABOLIC PANEL
Anion gap: 10 (ref 5–15)
BUN: 50 mg/dL — ABNORMAL HIGH (ref 8–23)
CO2: 29 mmol/L (ref 22–32)
Calcium: 9.4 mg/dL (ref 8.9–10.3)
Chloride: 91 mmol/L — ABNORMAL LOW (ref 98–111)
Creatinine, Ser: 3.9 mg/dL — ABNORMAL HIGH (ref 0.44–1.00)
GFR, Estimated: 12 mL/min — ABNORMAL LOW (ref >60)
Glucose, Bld: 115 mg/dL — ABNORMAL HIGH (ref 70–99)
Potassium: 4.5 mmol/L (ref 3.5–5.1)
Sodium: 130 mmol/L — ABNORMAL LOW (ref 135–145)

## 2021-02-21 LAB — URINALYSIS, ROUTINE W REFLEX MICROSCOPIC
Bilirubin Urine: NEGATIVE
Glucose, UA: 100 mg/dL — AB
Ketones, ur: NEGATIVE mg/dL
Leukocytes,Ua: NEGATIVE
Nitrite: NEGATIVE
Protein, ur: 100 mg/dL — AB
Specific Gravity, Urine: 1.015 (ref 1.005–1.030)
pH: 7.5 (ref 5.0–8.0)

## 2021-02-21 LAB — I-STAT CHEM 8, ED
BUN: 46 mg/dL — ABNORMAL HIGH (ref 8–23)
Calcium, Ion: 1.08 mmol/L — ABNORMAL LOW (ref 1.15–1.40)
Chloride: 92 mmol/L — ABNORMAL LOW (ref 98–111)
Creatinine, Ser: 4 mg/dL — ABNORMAL HIGH (ref 0.44–1.00)
Glucose, Bld: 141 mg/dL — ABNORMAL HIGH (ref 70–99)
HCT: 32 % — ABNORMAL LOW (ref 36.0–46.0)
Hemoglobin: 10.9 g/dL — ABNORMAL LOW (ref 12.0–15.0)
Potassium: 3.6 mmol/L (ref 3.5–5.1)
Sodium: 129 mmol/L — ABNORMAL LOW (ref 135–145)
TCO2: 26 mmol/L (ref 22–32)

## 2021-02-21 LAB — CBC WITH DIFFERENTIAL/PLATELET
Abs Immature Granulocytes: 0.03 10*3/uL (ref 0.00–0.07)
Basophils Absolute: 0 10*3/uL (ref 0.0–0.1)
Basophils Relative: 0 %
Eosinophils Absolute: 0.2 10*3/uL (ref 0.0–0.5)
Eosinophils Relative: 4 %
HCT: 29.7 % — ABNORMAL LOW (ref 36.0–46.0)
Hemoglobin: 10.5 g/dL — ABNORMAL LOW (ref 12.0–15.0)
Immature Granulocytes: 1 %
Lymphocytes Relative: 26 %
Lymphs Abs: 1.6 10*3/uL (ref 0.7–4.0)
MCH: 29.6 pg (ref 26.0–34.0)
MCHC: 35.4 g/dL (ref 30.0–36.0)
MCV: 83.7 fL (ref 80.0–100.0)
Monocytes Absolute: 0.6 10*3/uL (ref 0.1–1.0)
Monocytes Relative: 9 %
Neutro Abs: 3.6 10*3/uL (ref 1.7–7.7)
Neutrophils Relative %: 60 %
Platelets: 209 10*3/uL (ref 150–400)
RBC: 3.55 MIL/uL — ABNORMAL LOW (ref 3.87–5.11)
RDW: 11.9 % (ref 11.5–15.5)
WBC: 6.1 10*3/uL (ref 4.0–10.5)
nRBC: 0 % (ref 0.0–0.2)

## 2021-02-21 LAB — GLUCOSE, CAPILLARY
Glucose-Capillary: 129 mg/dL — ABNORMAL HIGH (ref 70–99)
Glucose-Capillary: 130 mg/dL — ABNORMAL HIGH (ref 70–99)

## 2021-02-21 LAB — URINALYSIS, MICROSCOPIC (REFLEX)

## 2021-02-21 LAB — CREATININE, URINE, RANDOM: Creatinine, Urine: 19.96 mg/dL

## 2021-02-21 LAB — SEDIMENTATION RATE: Sed Rate: 24 mm/hr — ABNORMAL HIGH (ref 0–22)

## 2021-02-21 LAB — SODIUM, URINE, RANDOM: Sodium, Ur: 60 mmol/L

## 2021-02-21 LAB — RESP PANEL BY RT-PCR (FLU A&B, COVID) ARPGX2
Influenza A by PCR: NEGATIVE
Influenza B by PCR: NEGATIVE
SARS Coronavirus 2 by RT PCR: NEGATIVE

## 2021-02-21 LAB — CBG MONITORING, ED: Glucose-Capillary: 120 mg/dL — ABNORMAL HIGH (ref 70–99)

## 2021-02-21 MED ORDER — CARVEDILOL 3.125 MG PO TABS
3.1250 mg | ORAL_TABLET | Freq: Two times a day (BID) | ORAL | Status: DC
  Administered 2021-02-21 – 2021-02-22 (×3): 3.125 mg via ORAL
  Filled 2021-02-21 (×3): qty 1

## 2021-02-21 MED ORDER — HEPARIN SODIUM (PORCINE) 5000 UNIT/ML IJ SOLN
5000.0000 [IU] | Freq: Three times a day (TID) | INTRAMUSCULAR | Status: DC
  Filled 2021-02-21 (×3): qty 1

## 2021-02-21 MED ORDER — ASPIRIN EC 81 MG PO TBEC
81.0000 mg | DELAYED_RELEASE_TABLET | Freq: Every evening | ORAL | Status: DC
  Administered 2021-02-21 – 2021-02-23 (×3): 81 mg via ORAL
  Filled 2021-02-21 (×3): qty 1

## 2021-02-21 MED ORDER — ACETAMINOPHEN 325 MG PO TABS
650.0000 mg | ORAL_TABLET | Freq: Four times a day (QID) | ORAL | Status: DC | PRN
  Administered 2021-02-23: 650 mg via ORAL
  Filled 2021-02-21 (×2): qty 2

## 2021-02-21 MED ORDER — DIPHENHYDRAMINE HCL 50 MG/ML IJ SOLN
12.5000 mg | Freq: Once | INTRAMUSCULAR | Status: AC
  Administered 2021-02-21: 12.5 mg via INTRAVENOUS
  Filled 2021-02-21: qty 1

## 2021-02-21 MED ORDER — FLUTICASONE PROPIONATE 50 MCG/ACT NA SUSP
2.0000 | Freq: Every day | NASAL | Status: DC
  Filled 2021-02-21: qty 16

## 2021-02-21 MED ORDER — ALBUTEROL SULFATE (2.5 MG/3ML) 0.083% IN NEBU: 2.5000 mg | INHALATION_SOLUTION | Freq: Four times a day (QID) | RESPIRATORY_TRACT | Status: DC | PRN

## 2021-02-21 MED ORDER — HYDRALAZINE HCL 20 MG/ML IJ SOLN: 10.0000 mg | INTRAMUSCULAR | Status: DC | PRN

## 2021-02-21 MED ORDER — INSULIN ASPART 100 UNIT/ML IJ SOLN: 0.0000 [IU] | Freq: Three times a day (TID) | INTRAMUSCULAR | Status: AC

## 2021-02-21 MED ORDER — MELATONIN 3 MG PO TABS
3.0000 mg | ORAL_TABLET | Freq: Every day | ORAL | Status: DC
  Filled 2021-02-21 (×2): qty 1

## 2021-02-21 MED ORDER — SODIUM CHLORIDE 0.9 % IV BOLUS
1000.0000 mL | Freq: Once | INTRAVENOUS | Status: AC
  Administered 2021-02-21: 1000 mL via INTRAVENOUS

## 2021-02-21 MED ORDER — BUTALBITAL-APAP-CAFFEINE 50-325-40 MG PO TABS
1.0000 | ORAL_TABLET | Freq: Four times a day (QID) | ORAL | Status: DC | PRN
  Filled 2021-02-21: qty 1

## 2021-02-21 MED ORDER — HYDRALAZINE HCL 20 MG/ML IJ SOLN
20.0000 mg | Freq: Once | INTRAMUSCULAR | Status: AC
  Administered 2021-02-21: 20 mg via INTRAVENOUS
  Filled 2021-02-21: qty 1

## 2021-02-21 MED ORDER — SODIUM CHLORIDE 0.9 % IV SOLN: Freq: Once | INTRAVENOUS | Status: AC

## 2021-02-21 MED ORDER — AMLODIPINE BESYLATE 5 MG PO TABS
10.0000 mg | ORAL_TABLET | Freq: Once | ORAL | Status: DC
  Filled 2021-02-21: qty 2

## 2021-02-21 MED ORDER — SODIUM CHLORIDE 0.9% FLUSH
3.0000 mL | Freq: Two times a day (BID) | INTRAVENOUS | Status: DC
  Administered 2021-02-21 – 2021-02-23 (×3): 3 mL via INTRAVENOUS

## 2021-02-21 MED ORDER — LORAZEPAM 2 MG/ML IJ SOLN
0.5000 mg | Freq: Once | INTRAMUSCULAR | Status: AC
  Administered 2021-02-21: 0.5 mg via INTRAVENOUS
  Filled 2021-02-21: qty 1

## 2021-02-21 MED ORDER — ISOSORBIDE MONONITRATE ER 60 MG PO TB24
60.0000 mg | ORAL_TABLET | Freq: Every day | ORAL | Status: DC
  Administered 2021-02-21 – 2021-02-23 (×3): 60 mg via ORAL
  Filled 2021-02-21 (×3): qty 1

## 2021-02-21 MED ORDER — CARVEDILOL 3.125 MG PO TABS
3.1250 mg | ORAL_TABLET | Freq: Once | ORAL | Status: AC
  Administered 2021-02-21: 3.125 mg via ORAL
  Filled 2021-02-21: qty 1

## 2021-02-21 MED ORDER — ACETAMINOPHEN 650 MG RE SUPP: 650.0000 mg | Freq: Four times a day (QID) | RECTAL | Status: DC | PRN

## 2021-02-21 MED ORDER — METOCLOPRAMIDE HCL 5 MG/ML IJ SOLN
5.0000 mg | Freq: Once | INTRAMUSCULAR | Status: AC
  Administered 2021-02-21: 5 mg via INTRAVENOUS
  Filled 2021-02-21: qty 2

## 2021-02-21 MED ORDER — AMLODIPINE BESYLATE 10 MG PO TABS
10.0000 mg | ORAL_TABLET | Freq: Every day | ORAL | Status: DC
  Administered 2021-02-21 – 2021-02-23 (×3): 10 mg via ORAL
  Filled 2021-02-21 (×3): qty 1

## 2021-02-21 NOTE — ED Notes (Signed)
Lab to add on troponin and sedimentation rate

## 2021-02-21 NOTE — ED Notes (Signed)
Patient was discharged family brought patient back in to ED to use the bathroom and she fell very weak and had a near syncopal episode in the bathroom.

## 2021-02-21 NOTE — H&P (Addendum)
History and Physical    Paula [name] KWI:[id number] DOB: 15-Aug-1945 DOA: 02/21/2021  Referring MD/NP/PA: Isla Pence, MD PCP: Charlott Rakes, MD  Consultants: Dr. [name]- Patient coming from: Home  Chief Complaint: Headache  I have personally briefly reviewed patient's old medical records in [place]   HPI: Paula [name] is a 75 y.o. female with medical history significant of hypertension, hyperlipidemia, diastolic CHF last EF 65 -24%, cerebral aneurysm, and diet-controlled diabetes mellitus type 2 presents with complaints of a headache intermittently over the last week.  Pain is sometimes in the frontal part of her head and sometimes all over.  She reports the veins in her scalp began to throb and are distended and tender to the touch.  In addition to the headache patient reports that her blood pressure and blood sugars have also been elevated.  The patient had been taken off glipizide by her primary care provider 2 months ago due to recurrent low blood sugars.  However this week, blood sugars were noted to be elevated into the 180s.  She still had old prescription and took a quarter of a piece of glipizide, but reported that it dropped her blood sugars.  She has been taking her blood pressure medication as advised.  During her last visit with her nephrologist Dr. [name] which apparently was approximately 2 months ago she was taken off of amlodipine due to worsening her kidney function.  Patient was also possibly advised to discontinue verapamil, but she continue this medication as Dr. [name] had not told her to stop it.  Other associated symptoms include unsteady gait(last reported fall 6 months ago in [name]), memory loss, and left-sided chest pain underneath her left breast.  She tried warm compresses to the left side of her chest wall without any provement in symptoms.  Patient reportedly still is able to urinate.  Patient states that she had an accident over 20 years ago  requiring her to have surgery on her jaw.   Since that time reports she has this chronic cough with drainage down the back of her throat which keeps her up at night and the only thing that is ever helped previously in the past was Sudafed.  However, she was advised to discontinue that medication. Complains of having severe pain that goes to her left ear with drainage that comes down.   Previously had tried other nasal sprays without any relief and had been seen by ENT, but did not find anything helpful.  Patient reports having a hole in her palate that was seen by dental students at some point in time, but not totally clear.  Discussed with Dr. [name] who reports the patient had been advised to stop the verapamil due to her being on double calcium channel blockers in addition to amlodipine.  ED Course: Upon admission into the emergency department patient was Noted to be afebrile, pulse 52-87, blood pressures elevated up to 237/97, and all other vital signs maintained.  CT at the head noted no acute abnormalities.  Labs noted hemoglobin 10.5, sodium 130, BUN 50, creatinine 3.9, sedimentation rate 24, and high-sensitivity troponin 18.  Chest x-ray showed no acute abnormalities.  Patient had been given Reglan, Benadryl, Ativan 0.5 mg IV, hydralazine 20 mg IV, and home dose of Coreg given.  Blood pressure appear to be improved and plan was for discharge.  However, after patient had been discharged while going to use the bathroom patient felt weak and reportedly passed out for approximately 5 minutes  per her daughter's report.  Blood pressure was noted to be 105/55 and she was found to be orthostatic.  Patient was given 1 L normal saline IV fluids with improvement in blood pressures.  Review of Systems  Constitutional:  Positive for malaise/fatigue.  HENT:  Positive for congestion and sinus pain.   Eyes:  Negative for photophobia and pain.  Respiratory:  Positive for cough.   Cardiovascular:  Positive for  chest pain. Negative for leg swelling.  Gastrointestinal:  Positive for constipation. Negative for abdominal pain, nausea and vomiting.  Genitourinary:  Negative for dysuria and hematuria.  Musculoskeletal:  Positive for back pain.  Neurological:  Positive for loss of consciousness and headaches.  Psychiatric/Behavioral:  Positive for memory loss. Negative for substance abuse. The patient has insomnia.    Past Medical History:  Diagnosis Date   Allergic rhinitis    Arthritis    Asthma    Brain aneurysm    Chronic kidney disease    Stage 3   Cough    Diabetes mellitus    Diastolic CHF, chronic ([facility]) 10/11/2011   GERD (gastroesophageal reflux disease)    History of colon polyps 2012   tubular adenoma    Hyperlipidemia    Hypertension    Neuropathy 10/11/2011   PONV (postoperative nausea and vomiting)    one time after lymph node surgery    Past Surgical History:  Procedure Laterality Date   CATARACT EXTRACTION     right eye   IR 3D INDEPENDENT [facility]KST  12/14/2017   IR ANGIO INTRA EXTRACRAN SEL COM CAROTID INNOMINATE BILAT MOD SED  09/15/2017   IR ANGIO INTRA EXTRACRAN SEL COM CAROTID INNOMINATE UNI L MOD SED  12/14/2017   IR ANGIO VERTEBRAL SEL VERTEBRAL BILAT MOD SED  09/15/2017   IR RADIOLOGIST EVAL & MGMT  09/10/2017   IR RADIOLOGIST EVAL & MGMT  10/19/2017   lymphatic mass surgery     lymphoma mass surgery Bilateral    non cancerous   NASAL TURBINATE REDUCTION     RADIOLOGY WITH ANESTHESIA N/A 12/14/2017   Procedure: RADIOLOGY WITH ANESTHESIA EMBOLIZATION;  Surgeon: Luanne Bras, MD;  Location: [facility];  Service: Radiology;  Laterality: N/A;   VIDEO BRONCHOSCOPY Bilateral 11/18/2017   Procedure: VIDEO BRONCHOSCOPY WITHOUT FLUORO;  Surgeon: Collene Gobble, MD;  Location: [facility];  Service: Cardiopulmonary;  Laterality: Bilateral;     reports that she has never smoked. She has never used smokeless tobacco. She reports that she does not drink alcohol and does not use  drugs.  Allergies  Allergen Reactions   Dust Mite Extract Cough   Ace Inhibitors Cough   Lovastatin Other (See Comments)    Generalized body pain    Family History  Problem Relation Age of Onset   Hypertension Mother    Asthma Sister    Colon cancer Neg Hx    Allergic rhinitis Neg Hx    Eczema Neg Hx     Prior to Admission medications   Medication Sig Start Date End Date Taking? Authorizing Provider  gabapentin (NEURONTIN) 100 MG capsule Take 100 mg by mouth in the morning, at noon, and at bedtime. 10/17/19  Yes [provider]  Accu-Chek Softclix Lancets lancets USE AS INSTRUCTED TO CHECK BLOOD SUGAR DAILY. Patient taking differently: 1 each by Other route See admin instructions. USE AS INSTRUCTED TO CHECK BLOOD SUGAR DAILY. 12/07/20 12/06/21  Charlott Rakes, MD  acetaminophen (TYLENOL) 500 MG tablet Take 500-1,000 mg by mouth every 6 (six)  hours as needed for headache (pain).     [provider]  amLODipine (NORVASC) 10 MG tablet Take 1 tablet (10 mg total) by mouth daily. 01/09/21   Charlott Rakes, MD  aspirin EC 81 MG tablet Take 81 mg by mouth every evening.    [provider]  Blood Glucose Monitoring Suppl (TRUE METRIX METER) w/Device KIT Use to check blood sugar once daily. E11.21 Patient taking differently: 1 each by Other route See admin instructions. Use to check blood sugar once daily. E11.21 08/16/20   Charlott Rakes, MD  calcitRIOL (ROCALTROL) 0.25 MCG capsule Take 1 capsule by mouth daily. Patient taking differently: Take 0.25 mcg by mouth daily. 12/14/20   Edrick Oh, MD  carvedilol (COREG) 3.125 MG tablet TAKE 1 TABLET (3.125 MG TOTAL) BY MOUTH 2 (TWO) TIMES DAILY WITH A MEAL. Patient taking differently: Take 3.125 mg by mouth 2 (two) times daily with a meal. 08/16/20 08/16/21  Newlin, Charlane Ferretti, MD  glucose blood test strip USE TO CHECK BLOOD SUGAR ONCE DAILY. E11.21 Patient taking differently: 1 each by Other route See admin instructions. USE TO  CHECK BLOOD SUGAR ONCE DAILY. E11.21 08/16/20 08/16/21  Charlott Rakes, MD  hydrocortisone 1 % ointment Apply 1 application topically 2 (two) times daily. Patient not taking: Reported on 01/04/2021 10/19/18   Charlott Rakes, MD  isosorbide mononitrate (IMDUR) 60 MG 24 hr tablet TAKE 1 TABLET (60 MG TOTAL) BY MOUTH DAILY. Patient taking differently: Take 60 mg by mouth daily. 01/09/21 01/09/22  Charlott Rakes, MD  meclizine (ANTIVERT) 25 MG tablet Take 1 tablet (25 mg total) by mouth 3 (three) times daily as needed for dizziness. Patient not taking: Reported on 01/04/2021 12/29/20   Isla Pence, MD  rosuvastatin (CRESTOR) 10 MG tablet TAKE 1 TABLET (10 MG TOTAL) BY MOUTH DAILY. Patient taking differently: Take 10 mg by mouth daily. 01/09/21 01/09/22  Charlott Rakes, MD  sodium chloride (OCEAN) 0.65 % SOLN nasal spray Place 2 sprays into both nostrils as needed for congestion. Patient not taking: Reported on 01/04/2021 03/21/19   Martyn Ehrich, NP  TRUEplus Lancets 28G MISC Use to check blood sugar once daily. E11.21 Patient taking differently: 1 each by Other route See admin instructions. Use to check blood sugar once daily. E11.21 02/24/20   Charlott Rakes, MD    Physical Exam:  Constitutional: Elderly female who appears to be in some distress. Vitals:   02/21/21 1015 02/21/21 1200 02/21/21 1230 02/21/21 1353  BP: 129/68 (!) 179/77 (!) 162/85 (!) 173/79  Pulse: 74 86 80 87  Resp: 20 16 16 16   Temp:      TempSrc:      SpO2: 100% 100% 99% 98%  Weight:      Height:       Eyes: PERRL, lids and conjunctivae normal ENMT: Mucous membranes are moist.  Asymmetry appreciated of the left side of jaw where previous surgery had taken place. Neck: normal, supple, no masses, no thyromegaly.  No JVD. Respiratory: clear to auscultation bilaterally, no wheezing, no crackles. Normal respiratory effort. No accessory muscle use.  Patient with intermittent cough appreciated. Cardiovascular: Regular rate and  rhythm, no murmurs / rubs / gallops. No extremity edema. 2+ pedal pulses. No carotid bruits.  Abdomen: no tenderness, no masses palpated. No hepatosplenomegaly. Bowel sounds positive.  Musculoskeletal: no clubbing / cyanosis. No joint deformity upper and lower extremities. Good ROM, no contractures. Normal muscle tone.  Skin: no rashes, lesions, ulcers. No induration Neurologic: CN 2-12 grossly intact.  Sensation intact, DTR normal. Strength 5/5 in all 4.  Psychiatric: Normal judgment and insight. Alert and oriented x 3.  Anxious mood.     Labs on Admission: I have personally reviewed following labs and imaging studies  CBC: Recent Labs  Lab 02/21/21 0332 02/21/21 0538  WBC 6.1  --   NEUTROABS 3.6  --   HGB 10.5* 10.9*  HCT 29.7* 32.0*  MCV 83.7  --   PLT 209  --    Basic Metabolic Panel: Recent Labs  Lab 02/21/21 0332 02/21/21 0538  NA 130* 129*  K 4.5 3.6  CL 91* 92*  CO2 29  --   GLUCOSE 115* 141*  BUN 50* 46*  CREATININE 3.90* 4.00*  CALCIUM 9.4  --    GFR: Estimated Creatinine Clearance: 10.8 mL/min (A) (by C-G formula based on SCr of 4 mg/dL (H)). Liver Function Tests: No results for input(s): AST, ALT, ALKPHOS, BILITOT, PROT, ALBUMIN in the last 168 hours. No results for input(s): LIPASE, AMYLASE in the last 168 hours. No results for input(s): AMMONIA in the last 168 hours. Coagulation Profile: No results for input(s): INR, PROTIME in the last 168 hours. Cardiac Enzymes: No results for input(s): CKTOTAL, CKMB, CKMBINDEX, TROPONINI in the last 168 hours. BNP (last 3 results) No results for input(s): PROBNP in the last 8760 hours. HbA1C: No results for input(s): HGBA1C in the last 72 hours. CBG: Recent Labs  Lab 02/21/21 0959  GLUCAP 120*   Lipid Profile: No results for input(s): CHOL, HDL, LDLCALC, TRIG, CHOLHDL, LDLDIRECT in the last 72 hours. Thyroid Function Tests: No results for input(s): TSH, T4TOTAL, FREET4, T3FREE, THYROIDAB in the last 72  hours. Anemia Panel: No results for input(s): VITAMINB12, FOLATE, FERRITIN, TIBC, IRON, RETICCTPCT in the last 72 hours. Urine analysis:    Component Value Date/Time   COLORURINE STRAW (A) 12/29/2020 1309   APPEARANCEUR CLEAR 12/29/2020 1309   LABSPEC 1.004 (L) 12/29/2020 1309   PHURINE 6.0 12/29/2020 1309   GLUCOSEU NEGATIVE 12/29/2020 1309   HGBUR NEGATIVE 12/29/2020 1309   BILIRUBINUR NEGATIVE 12/29/2020 1309   BILIRUBINUR negative 09/02/2018 1128   KETONESUR NEGATIVE 12/29/2020 1309   PROTEINUR >=300 (A) 12/29/2020 1309   UROBILINOGEN 0.2 09/02/2018 1128   UROBILINOGEN 0.2 05/12/2018 1259   NITRITE NEGATIVE 12/29/2020 1309   LEUKOCYTESUR NEGATIVE 12/29/2020 1309   Sepsis Labs: Recent Results (from the past 240 hour(s))  Resp Panel by RT-PCR (Flu A&B, Covid) Nasopharyngeal Swab     Status: None   Collection Time: 02/21/21  6:24 AM   Specimen: Nasopharyngeal Swab; Nasopharyngeal(NP) swabs in vial transport medium  Result Value Ref Range Status   SARS Coronavirus 2 by RT PCR NEGATIVE NEGATIVE Final    Comment: (NOTE) SARS-CoV-2 target nucleic acids are NOT DETECTED.  The SARS-CoV-2 RNA is generally detectable in upper respiratory specimens during the acute phase of infection. The lowest concentration of SARS-CoV-2 viral copies this assay can detect is 138 copies/mL. A negative result does not preclude SARS-Cov-2 infection and should not be used as the sole basis for treatment or other patient management decisions. A negative result may occur with  improper specimen collection/handling, submission of specimen other than nasopharyngeal swab, presence of viral mutation(s) within the areas targeted by this assay, and inadequate number of viral copies(<138 copies/mL). A negative result must be combined with clinical observations, patient history, and epidemiological information. The expected result is Negative.  Fact Sheet for Patients:   EntrepreneurPulse.com.au  Fact Sheet for Healthcare Providers:  IncredibleEmployment.be  This test is no t yet approved or cleared by the [place] and  has been authorized for detection and/or diagnosis of SARS-CoV-2 by FDA under an Emergency Use Authorization (EUA). This EUA will remain  in effect (meaning this test can be used) for the duration of the COVID-19 declaration under Section 564(b)(1) of the Act, 21 U.S.C.section 360bbb-3(b)(1), unless the authorization is terminated  or revoked sooner.       Influenza A by PCR NEGATIVE NEGATIVE Final   Influenza B by PCR NEGATIVE NEGATIVE Final    Comment: (NOTE) The Xpert Xpress SARS-CoV-2/FLU/RSV plus assay is intended as an aid in the diagnosis of influenza from Nasopharyngeal swab specimens and should not be used as a sole basis for treatment. Nasal washings and aspirates are unacceptable for Xpert Xpress SARS-CoV-2/FLU/RSV testing.  Fact Sheet for Patients: EntrepreneurPulse.com.au  Fact Sheet for Healthcare Providers: IncredibleEmployment.be  This test is not yet approved or cleared by the [place] FDA and has been authorized for detection and/or diagnosis of SARS-CoV-2 by FDA under an Emergency Use Authorization (EUA). This EUA will remain in effect (meaning this test can be used) for the duration of the COVID-19 declaration under Section 564(b)(1) of the Act, 21 U.S.C. section 360bbb-3(b)(1), unless the authorization is terminated or revoked.  Performed at [facility], [place] [address], [place], [place] [address]      Radiological Exams on Admission: CT Head Wo Contrast  Result Date: 02/21/2021 CLINICAL DATA:  75 year old female with history of headache. Intracranial hemorrhage suspected. EXAM: CT HEAD WITHOUT CONTRAST TECHNIQUE: Contiguous axial images were obtained from the base of the skull through the vertex without  intravenous contrast. COMPARISON:  Head CT 12/29/2020. FINDINGS: Brain: Patchy and confluent areas of decreased attenuation are noted throughout the deep and periventricular white matter of the cerebral hemispheres bilaterally, compatible with chronic microvascular ischemic disease. No evidence of acute infarction, hemorrhage, hydrocephalus, extra-axial collection or mass lesion/mass effect. Vascular: No hyperdense vessel or unexpected calcification. Skull: Normal. Negative for fracture or focal lesion. Sinuses/Orbits: No acute finding. Other: None. IMPRESSION: 1. No acute intracranial abnormalities. 2. Chronic microvascular ischemic changes in the cerebral white matter, as above. Electronically Signed   By: Vinnie Langton M.D.   On: 02/21/2021 07:02   DG Chest Port 1 View  Result Date: 02/21/2021 CLINICAL DATA:  75 year old female with cough, hypertension, shortness of breath. EXAM: PORTABLE CHEST 1 VIEW COMPARISON:  Chest radiographs 09/20/2018 and earlier. FINDINGS: Portable AP upright view at 0616 hours. Lung volumes and mediastinal contours remain within normal limits; mild tortuosity of the descending aorta. Visualized tracheal air column is within normal limits. Allowing for portable technique the lungs are clear. No pneumothorax or pleural effusion. No acute osseous abnormality identified. IMPRESSION: Negative portable chest. Electronically Signed   By: Genevie Ann M.D.   On: 02/21/2021 06:26    EKG: Independently reviewed.  Sinus rhythm at 64 bpm with first-degree heart block.  Assessment/Plan Headache  hypertensive urgency/emergency: Acute.  Patient presents with complaints of headache found to have blood pressures elevated up to 237/97.  Headache was thought less likely to be temporal arteritis sed rate was only 24 and thought to be related with kidney disease.  Home blood pressure medications include Coreg 3.125 mg twice daily, verapamil 80 mg twice daily, and isosorbide mononitrate 60 mg daily.   Patient had acute drop in blood pressure after given Reglan, Benadryl, Ativan, and hydralazine 20 mg IV.  Discussed case with Dr. [name] who was  able to locate records and reports that amlodipine had been discontinued due to patient being on double calcium channel blockers.  He recommended discontinuing verapamil and restarting amlodipine or possibly increasing dose of verapamil for -Admit to a cardiac telemetry bed -Discontinued verapamil -Restart amlodipine 10 mg daily -Hydralazine 10 mg IV as needed for systolic blood pressures greater than 180.  Acute kidney injury was on chronic kidney disease stage IV: Patient presents with creatinine 3.9 with BUN 50.  Baseline creatinine had been around 3.4-3.7 in May of this year.  She is not on any diuretics.  Question if worsening of kidney function secondary to uncontrolled hypertension. -Check urinalysis -Check urine sodium and urine creatinine -Recommend outpatient follow-up with Dr. [name] -FeNA was noted to be 9% concerning  for post renal/obstructive causes -Follow-up renal ultrasound  Syncopal episode  orthostatic hypotension: Acute.  While in emergency department after being initially discharged patient had gone to the bathroom and reportedly had a syncopal episode with loss of consciousness for approximately 5 minutes prior to coming to per her daughter.  Suspect secondary to orthostatic hypotension given acute drop in blood pressures.  CT scan of the brain showed no acute abnormalities. -Recheck orthostatic vital signs in a.m.  Chest pain elevated troponin.  Patient reportedly has been having left-sided chest pain over this last week.  Initial high-sensitivity troponin was mildly elevated at 18.  She denies any chest pain currently at this time. -Recheck high-sensitivity troponin -Check echocardiogram   Left jaw pain and congestion upper airway cough syndrome: Patient reports having severe pain in her left jaw with drainage following  previous trauma over 20 years ago. -Check maxillofacial CT  Diastolic congestive heart failure: Chronic.  Patient does not appear to be fluid overloaded at this time. Last echocardiogram revealed EF 65-70% with grade 1 diastolic dysfunction back in 2019. -Strict intake and output -Daily weights  Gait disturbance: Patient reports feeling off balance at baseline and having difficulty getting around.  Last reported fall was approximately 6 months ago in [name]. -PT/OT to eval and treat -Transitions of care for possible need of home health  Anemia of chronic disease: Hemoglobin 10.5 g/dL which appears around patient's baseline. -Recheck hemoglobin in a.m.  History of diabetes mellitus type 2: Last point-of-care hemoglobin A1c was 5.8 on 01/04/2021.  Patient is not on any diabetic medications.  She had reported blood sugars elevated up to 180 at home this week where she took a quarter of glipizide, but then subsequently had low blood sugars. -Hypoglycemic protocol -CBGs before every meal with very sensitive SSI x1 day  Memory issues: Daughter reports that patient's short-term memory has been declining. -Likely needs follow-up in the outpatient setting  History of cerebral aneurysms: Patient just recently had MRI and 12/2020 that noted stable cerebral aneurysms followed by Dr. Mickeal Skinner. -Continue outpatient follow-up with Dr. Mickeal Skinner  Insomnia -Melatonin  DVT prophylaxis: Heparin Code Status: Full Family Communication: Daughter updated at bedside Disposition Plan: To be determined Consults called: Discussed case over phone with Dr. [name] regarding patient's last office visit with Dr. [name] Admission status: Observation, requiring less than 24-hour hospital stay  Norval Morton MD [facility]   If 7PM-7AM, please contact night-coverage   02/21/2021, 2:18 PM

## 2021-02-21 NOTE — ED Notes (Signed)
Pt ambulated in hall. Walked to restroom with 1 person assist. Pt seemed unsteady without holding onto staff.

## 2021-02-21 NOTE — ED Provider Notes (Signed)
Pt was d/c, but bp was still elevated.  Pt's daughter refused to go with her bp still elevated, so she requested additional meds.  So, pt was given hydralazine.  BP in the 160s at d/c.  As pt was leaving, she wanted to use the bathroom.  She got up and had a near syncopal event.  I suspect  that was from orthostasis.  We brought her back to her room and bp 105/55.  Pt given IVFs and bp is now back to the 160s.  I think it was a combination of the ativan and the bp meds.  Pt is now awake and alert.  She is eating.  She is unsteady on her feet and needs assistance to walk.  She is orthostatic.  Pt's bp is climbing back up.  I am worried if I try to treat it again in the ED, she will pass out again. Pt d/w Dr. Harvest Forest ([facility]) for admission.   Isla Pence, MD 02/21/21 1427

## 2021-02-21 NOTE — ED Provider Notes (Signed)
Pt signed out by Dr. Christy Gentles.  Labs reviewed and do not look acute.  BP is elevated, so she is given her bp meds.  Pt's ESR is minimally elevated at 24, but this is likely due to ckd as opposed to temporal arteritis.  Covid neg.  H/a likely from htn.  Pt looks and feels well.  She is stable for d/c.  Return if worse.   Isla Pence, MD 02/21/21 [phone]

## 2021-02-21 NOTE — ED Triage Notes (Signed)
C/o headache onset yest 5 pm. Hx. Of brain aneurysm 3 yrs ago and she wants to be checked.

## 2021-02-21 NOTE — ED Notes (Signed)
Attempted report x1. 

## 2021-02-21 NOTE — ED Notes (Signed)
cbg 120

## 2021-02-21 NOTE — ED Provider Notes (Signed)
Emergency Medicine Provider Triage Evaluation Note  Paula [name] , a 75 y.o. female  was evaluated in triage.  Pt complains of headache since yesterday.  States it is severe, 8/10.  Denies focal numbness/weakness, vomiting, blurred vision.  Pressures have been elevated at home as well, 225/88 in triage.  Reports she is taking her medications as prescribed.  Has known history of aneursyms, followed by Dr. Estanislado Pandy.  Review of Systems  Positive: Headache, HTN Negative: Blurred vision, vomiting  Physical Exam  BP (!) 225/88 (BP Location: Right Arm)   Pulse 64   Temp 98.5 F (36.9 C) (Oral)   Resp 18   Ht '5\' 2"'$  (1.575 m)   Wt 63 kg   LMP  (LMP Unknown)   SpO2 100%   BMI 25.42 kg/m  Gen:   Awake, no distress   Resp:  Normal effort  MSK:   Moves extremities without difficulty  Other:  Talking on phone with family, speech clear  Medical Decision Making  Medically screening exam initiated at 3:22 AM.  Appropriate orders placed.  Paula [name] was informed that the remainder of the evaluation will be completed by another provider, this initial triage assessment does not replace that evaluation, and the importance of remaining in the ED until their evaluation is complete.  Will obtain labs.  Given history of aneurysms and elevated BP here, will obtain CTA head.   Larene Pickett, PA-C 02/21/21 0325    Davonna Belling, MD 02/21/21 [phone]

## 2021-02-21 NOTE — ED Provider Notes (Signed)
[facility] EMERGENCY DEPARTMENT Provider Note   CSN: [id number] Arrival date & time: 02/21/21  0310     History Chief Complaint  Patient presents with   Headache    Paula [name] is a 75 y.o. female.  The history is provided by the patient and a relative.  Headache Pain location:  Generalized Radiates to:  Does not radiate Onset quality:  Gradual Duration:  12 hours Timing:  Constant Progression:  Unchanged Relieved by:  Nothing Worsened by:  Nothing Associated symptoms: congestion and cough   Associated symptoms: no abdominal pain, no blurred vision, no fever, no focal weakness, no neck stiffness, no numbness, no vomiting and no weakness   Patient with extensive history including chronic kidney disease, cerebral aneurysm, diabetes presents with multiple complaints.  She reports gradual onset of headache approximately 12 hours ago.  It was not sudden onset.  No associated vomiting or weakness.  She also reports point  tenderness to her left forehead. No visual changes are reported.  Patient also reports has had a chronic cough and nasal congestion She also reports left-sided chest pain for over a day that has been constant.    Past Medical History:  Diagnosis Date   Allergic rhinitis    Arthritis    Asthma    Brain aneurysm    Chronic kidney disease    Stage 3   Cough    Diabetes mellitus    Diastolic CHF, chronic ([place]) 10/11/2011   GERD (gastroesophageal reflux disease)    History of colon polyps 2012   tubular adenoma    Hyperlipidemia    Hypertension    Neuropathy 10/11/2011   PONV (postoperative nausea and vomiting)    one time after lymph node surgery    Patient Active Problem List   Diagnosis Date Noted   Upper airway cough syndrome 10/12/2017   Chronic rhinitis 10/01/2017   Seasonal allergic conjunctivitis 10/01/2017   Aneurysm ([place]) 38/46/6599   Acute metabolic encephalopathy 35/70/1779   URI (upper respiratory infection)  09/02/2017   Facial weakness 09/02/2017   Benign essential HTN 09/02/2017   Allergic rhinitis 08/20/2017   Chronic pain 05/07/2017   Palpitations 07/10/2016   Insomnia 03/25/2016   Gout 01/23/2016   History of cataract 02/27/2015   Seasonal and perennial allergic rhinitis 02/15/2012   Cough 11/04/2011   Diabetic neuropathy ([place]) 10/11/2011   Chronic diastolic congestive heart failure ([place]) 10/11/2011   CKD (chronic kidney disease) stage 3, GFR 30-59 ml/min ([facility]) 10/10/2011   GERD (gastroesophageal reflux disease)    Type 2 diabetes mellitus without complication, without long-term current use of insulin ([place]) 07/03/2010   Hyperlipidemia 07/03/2010   Essential hypertension 07/03/2010    Past Surgical History:  Procedure Laterality Date   CATARACT EXTRACTION     right eye   IR 3D INDEPENDENT WKST  12/14/2017   IR ANGIO INTRA EXTRACRAN SEL COM CAROTID INNOMINATE BILAT MOD SED  09/15/2017   IR ANGIO INTRA EXTRACRAN SEL COM CAROTID INNOMINATE UNI L MOD SED  12/14/2017   IR ANGIO VERTEBRAL SEL VERTEBRAL BILAT MOD SED  09/15/2017   IR RADIOLOGIST EVAL & MGMT  09/10/2017   IR RADIOLOGIST EVAL & MGMT  10/19/2017   lymphatic mass surgery     lymphoma mass surgery Bilateral    non cancerous   NASAL TURBINATE REDUCTION     RADIOLOGY WITH ANESTHESIA N/A 12/14/2017   Procedure: RADIOLOGY WITH ANESTHESIA EMBOLIZATION;  Surgeon: Luanne Bras, MD;  Location: [facility];  Service:  Radiology;  Laterality: N/A;   VIDEO BRONCHOSCOPY Bilateral 11/18/2017   Procedure: VIDEO BRONCHOSCOPY WITHOUT FLUORO;  Surgeon: Collene Gobble, MD;  Location: [facility];  Service: Cardiopulmonary;  Laterality: Bilateral;     OB History     Gravida  5   Para  4   Term  4   Preterm      AB  1   Living  4      SAB      IAB  1   Ectopic      Multiple      Live Births              Family History  Problem Relation Age of Onset   Hypertension Mother    Asthma Sister    Colon cancer Neg Hx     Allergic rhinitis Neg Hx    Eczema Neg Hx     Social History   Tobacco Use   Smoking status: Never   Smokeless tobacco: Never  Vaping Use   Vaping Use: Never used  Substance Use Topics   Alcohol use: No   Drug use: No    Home Medications Prior to Admission medications   Medication Sig Start Date End Date Taking? Authorizing Provider  Accu-Chek Softclix Lancets lancets USE AS INSTRUCTED TO CHECK BLOOD SUGAR DAILY. 12/07/20 12/06/21  Charlott Rakes, MD  acetaminophen (TYLENOL) 500 MG tablet Take 500-1,000 mg by mouth every 6 (six) hours as needed for headache (pain).     [provider]  amLODipine (NORVASC) 10 MG tablet Take 1 tablet (10 mg total) by mouth daily. 01/09/21   Charlott Rakes, MD  aspirin EC 81 MG tablet Take 81 mg by mouth every evening.    [provider]  Blood Glucose Monitoring Suppl (TRUE METRIX METER) w/Device KIT Use to check blood sugar once daily. E11.21 08/16/20   Charlott Rakes, MD  calcitRIOL (ROCALTROL) 0.25 MCG capsule Take 1 capsule by mouth daily. 12/14/20   Edrick Oh, MD  carvedilol (COREG) 3.125 MG tablet TAKE 1 TABLET (3.125 MG TOTAL) BY MOUTH 2 (TWO) TIMES DAILY WITH A MEAL. 08/16/20 08/16/21  Charlott Rakes, MD  glucose blood test strip USE TO CHECK BLOOD SUGAR ONCE DAILY. E11.21 08/16/20 08/16/21  Charlott Rakes, MD  hydrocortisone 1 % ointment Apply 1 application topically 2 (two) times daily. Patient not taking: Reported on 01/04/2021 10/19/18   Charlott Rakes, MD  isosorbide mononitrate (IMDUR) 60 MG 24 hr tablet TAKE 1 TABLET (60 MG TOTAL) BY MOUTH DAILY. 01/09/21 01/09/22  Charlott Rakes, MD  meclizine (ANTIVERT) 25 MG tablet Take 1 tablet (25 mg total) by mouth 3 (three) times daily as needed for dizziness. Patient not taking: Reported on 01/04/2021 12/29/20   Isla Pence, MD  rosuvastatin (CRESTOR) 10 MG tablet TAKE 1 TABLET (10 MG TOTAL) BY MOUTH DAILY. 01/09/21 01/09/22  Charlott Rakes, MD  sodium chloride (OCEAN) 0.65 % SOLN nasal  spray Place 2 sprays into both nostrils as needed for congestion. Patient not taking: Reported on 01/04/2021 03/21/19   Martyn Ehrich, NP  TRUEplus Lancets 28G MISC Use to check blood sugar once daily. E11.21 02/24/20   Charlott Rakes, MD    Allergies    Dust mite extract, Ace inhibitors, and Lovastatin  Review of Systems   Review of Systems  Constitutional:  Negative for fever.  HENT:  Positive for congestion.   Eyes:  Negative for blurred vision and visual disturbance.  Respiratory:  Positive for cough.  Cardiovascular:  Positive for chest pain.       Left sided CP for one day  Gastrointestinal:  Negative for abdominal pain and vomiting.  Musculoskeletal:  Negative for neck stiffness.  Neurological:  Positive for headaches. Negative for focal weakness, speech difficulty, weakness and numbness.  All other systems reviewed and are negative.  Physical Exam Updated Vital Signs BP (!) 205/92   Pulse 76   Temp 98.5 F (36.9 C) (Oral)   Resp 17   Ht 1.575 m (5' 2" )   Wt 63 kg   LMP  (LMP Unknown)   SpO2 100%   BMI 25.42 kg/m   Physical Exam CONSTITUTIONAL: Well developed/well nourished HEAD: Normocephalic/atraumatic, point tender to vessel on left forehead, no erythema. EYES: EOMI/PERRL, no nystagmus, no ptosis, no corneal haziness ENMT: Mucous membranes moist NECK: supple no meningeal signs CV: S1/S2 noted, no murmurs/rubs/gallops noted LUNGS: Lungs are clear to auscultation bilaterally, no apparent distress ABDOMEN: soft, nontender, no rebound or guarding GU:no cva tenderness NEURO:Awake/alert, face symmetric, no arm or leg drift is noted Equal 5/5 strength with shoulder abduction, elbow flex/extension, wrist flex/extension in upper extremities  Equal 5/5 strength with hip flexion,knee flex/extension Cranial nerves 3/4/5/6/02/16/09/11/12 tested and intact Sensation to light touch intact in all extremities EXTREMITIES: pulses normal, full ROM SKIN: warm, color  normal PSYCH: no abnormalities of mood noted  ED Results / Procedures / Treatments   Labs (all labs ordered are listed, but only abnormal results are displayed) Labs Reviewed  CBC WITH DIFFERENTIAL/PLATELET - Abnormal; Notable for the following components:      Result Value   RBC 3.55 (*)    Hemoglobin 10.5 (*)    HCT 29.7 (*)    All other components within normal limits  BASIC METABOLIC PANEL - Abnormal; Notable for the following components:   Sodium 130 (*)    Chloride 91 (*)    Glucose, Bld 115 (*)    BUN 50 (*)    Creatinine, Ser 3.90 (*)    GFR, Estimated 12 (*)    All other components within normal limits  I-STAT CHEM 8, ED - Abnormal; Notable for the following components:   Sodium 129 (*)    Chloride 92 (*)    BUN 46 (*)    Creatinine, Ser 4.00 (*)    Glucose, Bld 141 (*)    Calcium, Ion 1.08 (*)    Hemoglobin 10.9 (*)    HCT 32.0 (*)    All other components within normal limits  RESP PANEL BY RT-PCR (FLU A&B, COVID) [id number]  SEDIMENTATION RATE  TROPONIN I (HIGH SENSITIVITY)    EKG EKG Interpretation  Date/Time:  Thursday February 21 2021 03:11:39 EDT Ventricular Rate:  64 PR Interval:  248 QRS Duration: 84 QT Interval:  408 QTC Calculation: 420 R Axis:   -11 Text Interpretation: Sinus rhythm with 1st degree A-V block Possible Anterior infarct , age undetermined Abnormal ECG Confirmed by Ripley Fraise ([id number]) on 02/21/2021 5:49:13 AM  Radiology CT Head Wo Contrast  Result Date: 02/21/2021 CLINICAL DATA:  75 year old female with history of headache. Intracranial hemorrhage suspected. EXAM: CT HEAD WITHOUT CONTRAST TECHNIQUE: Contiguous axial images were obtained from the base of the skull through the vertex without intravenous contrast. COMPARISON:  Head CT 12/29/2020. FINDINGS: Brain: Patchy and confluent areas of decreased attenuation are noted throughout the deep and periventricular white matter of the cerebral hemispheres bilaterally, compatible with chronic  microvascular ischemic disease. No evidence of acute infarction, hemorrhage, hydrocephalus, extra-axial collection or  mass lesion/mass effect. Vascular: No hyperdense vessel or unexpected calcification. Skull: Normal. Negative for fracture or focal lesion. Sinuses/Orbits: No acute finding. Other: None. IMPRESSION: 1. No acute intracranial abnormalities. 2. Chronic microvascular ischemic changes in the cerebral white matter, as above. Electronically Signed   By: Vinnie Langton M.D.   On: 02/21/2021 07:02   DG Chest Port 1 View  Result Date: 02/21/2021 CLINICAL DATA:  74 year old female with cough, hypertension, shortness of breath. EXAM: PORTABLE CHEST 1 VIEW COMPARISON:  Chest radiographs 09/20/2018 and earlier. FINDINGS: Portable AP upright view at 0616 hours. Lung volumes and mediastinal contours remain within normal limits; mild tortuosity of the descending aorta. Visualized tracheal air column is within normal limits. Allowing for portable technique the lungs are clear. No pneumothorax or pleural effusion. No acute osseous abnormality identified. IMPRESSION: Negative portable chest. Electronically Signed   By: Genevie Ann M.D.   On: 02/21/2021 06:26    Procedures Procedures   Medications Ordered in ED Medications  metoCLOPramide (REGLAN) injection 5 mg (5 mg Intravenous Given 02/21/21 4193)  diphenhydrAMINE (BENADRYL) injection 12.5 mg (12.5 mg Intravenous Given 02/21/21 0620)  0.9 %  sodium chloride infusion ( Intravenous New Bag/Given 02/21/21 7902)    ED Course  I have reviewed the triage vital signs and the nursing notes.  Pertinent labs & imaging results that were available during my care of the patient were reviewed by me and considered in my medical decision making (see chart for details).    MDM Rules/Calculators/A&P                          6:55 AM Patient presented for multiple complaints.  She reported gradual onset of headache approximate 12 hours ago reports previous history of  cerebral aneurysm.  MRA from May 2022 reveals stable aneursyms.    Initial provider ordered CT angio, but patient cannot receive IV contrast due to chronic renal disease.  Patient is very well-appearing at this time.  The headache was not described as sudden onset.  Since patient just had angiography in May, we will focus on CT head to rule out any obvious subarachnoid hemorrhage.  If CT head is negative, my suspicion for occult SAH or other cause is low. Patient also had point tenderness to the left side of her forehead, will add on a sed rate to evaluate for temporal arteritis  Patient also mention chronic cough as well as persistent chest pain. No acute EKG changes noted.  Chest x-ray and troponin have been 7:21 AM Signed out to Dr Gilford Raid at shift change Pt requesting meds for "nerves/anxiety" and also home BP meds Final Clinical Impression(s) / ED Diagnoses Final diagnoses:  None    Rx / DC Orders ED Discharge Orders     None        Ripley Fraise, MD 02/21/21 [phone]

## 2021-02-22 ENCOUNTER — Observation Stay (HOSPITAL_COMMUNITY): Payer: Medicaid Other

## 2021-02-22 ENCOUNTER — Other Ambulatory Visit: Payer: Self-pay

## 2021-02-22 DIAGNOSIS — R072 Precordial pain: Secondary | ICD-10-CM | POA: Diagnosis present

## 2021-02-22 DIAGNOSIS — I130 Hypertensive heart and chronic kidney disease with heart failure and stage 1 through stage 4 chronic kidney disease, or unspecified chronic kidney disease: Secondary | ICD-10-CM | POA: Diagnosis present

## 2021-02-22 DIAGNOSIS — N184 Chronic kidney disease, stage 4 (severe): Secondary | ICD-10-CM | POA: Diagnosis present

## 2021-02-22 DIAGNOSIS — I671 Cerebral aneurysm, nonruptured: Secondary | ICD-10-CM | POA: Diagnosis present

## 2021-02-22 DIAGNOSIS — Z7982 Long term (current) use of aspirin: Secondary | ICD-10-CM | POA: Diagnosis not present

## 2021-02-22 DIAGNOSIS — R269 Unspecified abnormalities of gait and mobility: Secondary | ICD-10-CM | POA: Diagnosis present

## 2021-02-22 DIAGNOSIS — D631 Anemia in chronic kidney disease: Secondary | ICD-10-CM | POA: Diagnosis present

## 2021-02-22 DIAGNOSIS — I440 Atrioventricular block, first degree: Secondary | ICD-10-CM | POA: Diagnosis present

## 2021-02-22 DIAGNOSIS — I5032 Chronic diastolic (congestive) heart failure: Secondary | ICD-10-CM | POA: Diagnosis present

## 2021-02-22 DIAGNOSIS — Z20822 Contact with and (suspected) exposure to covid-19: Secondary | ICD-10-CM | POA: Diagnosis present

## 2021-02-22 DIAGNOSIS — G4700 Insomnia, unspecified: Secondary | ICD-10-CM | POA: Diagnosis present

## 2021-02-22 DIAGNOSIS — E1122 Type 2 diabetes mellitus with diabetic chronic kidney disease: Secondary | ICD-10-CM | POA: Diagnosis present

## 2021-02-22 DIAGNOSIS — E785 Hyperlipidemia, unspecified: Secondary | ICD-10-CM | POA: Diagnosis present

## 2021-02-22 DIAGNOSIS — N179 Acute kidney failure, unspecified: Secondary | ICD-10-CM | POA: Diagnosis present

## 2021-02-22 DIAGNOSIS — R0789 Other chest pain: Secondary | ICD-10-CM | POA: Diagnosis present

## 2021-02-22 DIAGNOSIS — Z8249 Family history of ischemic heart disease and other diseases of the circulatory system: Secondary | ICD-10-CM | POA: Diagnosis not present

## 2021-02-22 DIAGNOSIS — I161 Hypertensive emergency: Secondary | ICD-10-CM | POA: Diagnosis present

## 2021-02-22 DIAGNOSIS — R55 Syncope and collapse: Secondary | ICD-10-CM

## 2021-02-22 DIAGNOSIS — N183 Chronic kidney disease, stage 3 unspecified: Secondary | ICD-10-CM

## 2021-02-22 DIAGNOSIS — I160 Hypertensive urgency: Secondary | ICD-10-CM | POA: Diagnosis not present

## 2021-02-22 DIAGNOSIS — Z825 Family history of asthma and other chronic lower respiratory diseases: Secondary | ICD-10-CM | POA: Diagnosis not present

## 2021-02-22 DIAGNOSIS — Z79899 Other long term (current) drug therapy: Secondary | ICD-10-CM | POA: Diagnosis not present

## 2021-02-22 DIAGNOSIS — K551 Chronic vascular disorders of intestine: Secondary | ICD-10-CM | POA: Diagnosis present

## 2021-02-22 DIAGNOSIS — I10 Essential (primary) hypertension: Secondary | ICD-10-CM | POA: Diagnosis not present

## 2021-02-22 DIAGNOSIS — I951 Orthostatic hypotension: Secondary | ICD-10-CM | POA: Diagnosis present

## 2021-02-22 DIAGNOSIS — R6884 Jaw pain: Secondary | ICD-10-CM | POA: Diagnosis present

## 2021-02-22 LAB — RENAL FUNCTION PANEL
Albumin: 3 g/dL — ABNORMAL LOW (ref 3.5–5.0)
Anion gap: 6 (ref 5–15)
BUN: 47 mg/dL — ABNORMAL HIGH (ref 8–23)
CO2: 27 mmol/L (ref 22–32)
Calcium: 8.6 mg/dL — ABNORMAL LOW (ref 8.9–10.3)
Chloride: 101 mmol/L (ref 98–111)
Creatinine, Ser: 3.56 mg/dL — ABNORMAL HIGH (ref 0.44–1.00)
GFR, Estimated: 13 mL/min — ABNORMAL LOW (ref >60)
Glucose, Bld: 157 mg/dL — ABNORMAL HIGH (ref 70–99)
Phosphorus: 4.9 mg/dL — ABNORMAL HIGH (ref 2.5–4.6)
Potassium: 4 mmol/L (ref 3.5–5.1)
Sodium: 134 mmol/L — ABNORMAL LOW (ref 135–145)

## 2021-02-22 LAB — CBC
HCT: 28.1 % — ABNORMAL LOW (ref 36.0–46.0)
Hemoglobin: 10.3 g/dL — ABNORMAL LOW (ref 12.0–15.0)
MCH: 30.5 pg (ref 26.0–34.0)
MCHC: 36.7 g/dL — ABNORMAL HIGH (ref 30.0–36.0)
MCV: 83.1 fL (ref 80.0–100.0)
Platelets: 197 10*3/uL (ref 150–400)
RBC: 3.38 MIL/uL — ABNORMAL LOW (ref 3.87–5.11)
RDW: 12.3 % (ref 11.5–15.5)
WBC: 6.2 10*3/uL (ref 4.0–10.5)
nRBC: 0 % (ref 0.0–0.2)

## 2021-02-22 LAB — ECHOCARDIOGRAM LIMITED
AV Mean grad: 5.5 mmHg
AV Peak grad: 9.6 mmHg
Ao pk vel: 1.55 m/s
Area-P 1/2: 2.62 cm2
Calc EF: 71.6 %
Height: 62 in
P 1/2 time: 481 msec
S' Lateral: 1.1 cm
Single Plane A2C EF: 77.2 %
Single Plane A4C EF: 66.5 %
Weight: 2165.8 oz

## 2021-02-22 LAB — TROPONIN I (HIGH SENSITIVITY): Troponin I (High Sensitivity): 26 ng/L — ABNORMAL HIGH (ref <18)

## 2021-02-22 LAB — TSH: TSH: 3.226 u[IU]/mL (ref 0.350–4.500)

## 2021-02-22 LAB — GLUCOSE, CAPILLARY
Glucose-Capillary: 109 mg/dL — ABNORMAL HIGH (ref 70–99)
Glucose-Capillary: 160 mg/dL — ABNORMAL HIGH (ref 70–99)
Glucose-Capillary: 172 mg/dL — ABNORMAL HIGH (ref 70–99)
Glucose-Capillary: 148 mg/dL — ABNORMAL HIGH (ref 70–99)

## 2021-02-22 MED ORDER — ACETAMINOPHEN 500 MG PO TABS
1000.0000 mg | ORAL_TABLET | Freq: Once | ORAL | Status: AC
  Administered 2021-02-22: 1000 mg via ORAL
  Filled 2021-02-22: qty 2

## 2021-02-22 MED ORDER — HYDRALAZINE HCL 10 MG PO TABS
10.0000 mg | ORAL_TABLET | Freq: Three times a day (TID) | ORAL | Status: DC
  Administered 2021-02-22 – 2021-02-23 (×3): 10 mg via ORAL
  Filled 2021-02-22 (×3): qty 1

## 2021-02-22 MED ORDER — CARVEDILOL 3.125 MG PO TABS
3.1250 mg | ORAL_TABLET | Freq: Once | ORAL | Status: AC
  Administered 2021-02-22: 3.125 mg via ORAL
  Filled 2021-02-22: qty 1

## 2021-02-22 MED ORDER — CARVEDILOL 6.25 MG PO TABS
6.2500 mg | ORAL_TABLET | Freq: Two times a day (BID) | ORAL | Status: DC
  Administered 2021-02-23: 6.25 mg via ORAL
  Filled 2021-02-22: qty 1

## 2021-02-22 MED ORDER — LORAZEPAM 0.5 MG PO TABS
0.5000 mg | ORAL_TABLET | Freq: Once | ORAL | Status: DC | PRN
  Administered 2021-02-22: 0.5 mg via ORAL
  Filled 2021-02-22: qty 1

## 2021-02-22 MED ORDER — LORAZEPAM 2 MG/ML IJ SOLN: 0.5000 mg | Freq: Once | INTRAMUSCULAR | Status: DC | PRN

## 2021-02-22 MED ORDER — AMLODIPINE BESYLATE 10 MG PO TABS
10.0000 mg | ORAL_TABLET | Freq: Every day | ORAL | 0 refills | Status: DC
  Filled 2021-02-22: qty 30, 30d supply, fill #0

## 2021-02-22 NOTE — Evaluation (Signed)
Physical Therapy Evaluation Patient Details Name: Paula [name] MRN: [id number] DOB: 02/15/1946 Today's Date: 02/22/2021   History of Present Illness  75 y.o. presenting on 02/21/2021 with complaints of intermitten headache over last week. CT head and CXR with no acute abnormalities. Of note, on admission into ED BP elevated to 237/97. BP appeared improved and plan for d/c. After d/c pt felt weak and reportedly passed out in the bathroom for 5 minutes. Pt found to be orthostatic. PMH: hypertension, hyperlipidemia, diastolic CHF last EF 65 -[id number], cerebral aneurysm, and diet-controlled diabetes mellitus type 2 presents.  Clinical Impression  Pt presents with generalized weakness and deficits in balance and activity tolerance. Pt tolerates bed mobility and transfers without physical assistance. PTA, pt and daughter report pt ambulates with assistance from daughter for stability as needed. Pt ambulates for household distances with use of RW and reports improved stability compared to baseline. Pt declines stair training this session and may benefit at next session as she has 2 flights to manage to get to her third floor apartment. Pt will benefit from continued acute PT to maximize independence. SPT recommends HHPT to improve balance and activity tolerance.     Follow Up Recommendations Home health PT;Supervision - Intermittent    Equipment Recommendations  Rolling walker with 5" wheels    Recommendations for Other Services       Precautions / Restrictions Precautions Precautions: Fall Restrictions Weight Bearing Restrictions: No      Mobility  Bed Mobility Overal bed mobility: Needs Assistance Bed Mobility: Supine to Sit     Supine to sit: HOB elevated;Supervision     General bed mobility comments: cues to reach for railing and to bring LEs to EOB    Transfers Overall transfer level: Needs assistance Equipment used: None Transfers: Sit to/from Stand Sit to Stand: Min guard          General transfer comment: min G for safety.  Ambulation/Gait Ambulation/Gait assistance: Supervision;Modified independent (Device/Increase time) Gait Distance (Feet): 250 Feet Assistive device: Rolling walker (2 wheeled) Gait Pattern/deviations: Step-through pattern;Decreased stride length Gait velocity: decreased Gait velocity interpretation: 1.31 - 2.62 ft/sec, indicative of limited community ambulator General Gait Details: slow steady step-through gait. pt reports feeling more steady with use of RW compared to baseline  Stairs            Wheelchair Mobility    Modified Rankin (Stroke Patients Only)       Balance Overall balance assessment: Needs assistance Sitting-balance support: Feet supported Sitting balance-Leahy Scale: Fair     Standing balance support: During functional activity Standing balance-Leahy Scale: Fair Standing balance comment: Pt tolerates standing for BP during session without reliance of UE support or external support.                             Pertinent Vitals/Pain Pain Assessment: No/denies pain    Home Living Family/patient expects to be discharged to:: Private residence Living Arrangements: Alone Available Help at Discharge: Family;Available PRN/intermittently Type of Home: Apartment (3rd floor apartment) Home Access: Stairs to enter Entrance Stairs-Rails: Right Entrance Stairs-Number of Steps:  (2 flights) Home Layout: One level Home Equipment: Shower seat;Toilet riser      Prior Function Level of Independence: Needs assistance   Gait / Transfers Assistance Needed: Pt ambulates with assistance from daughter when needed.  ADL's / Homemaking Assistance Needed: Pt able to dress and bathe herself, sometimes needing assistance to step into tub.  Hand Dominance        Extremity/Trunk Assessment   Upper Extremity Assessment Upper Extremity Assessment: Defer to OT evaluation    Lower Extremity  Assessment Lower Extremity Assessment: Generalized weakness    Cervical / Trunk Assessment Cervical / Trunk Assessment: Normal  Communication   Communication: No difficulties  Cognition Arousal/Alertness: Awake/alert Behavior During Therapy: WFL for tasks assessed/performed Overall Cognitive Status: Within Functional Limits for tasks assessed                                 General Comments: pt A&O x 4      General Comments General comments (skin integrity, edema, etc.): Pt denies dizziness, VSS stable on RA.    Exercises     Assessment/Plan    PT Assessment Patient needs continued PT services  PT Problem List Decreased strength;Decreased balance;Decreased knowledge of use of DME;Decreased safety awareness;Decreased knowledge of precautions       PT Treatment Interventions DME instruction;Gait training;Stair training;Functional mobility training;Therapeutic activities;Therapeutic exercise;Balance training;Patient/family education    PT Goals (Current goals can be found in the Care Plan section)  Acute Rehab PT Goals Patient Stated Goal: get better and go home PT Goal Formulation: With patient Time For Goal Achievement: 03/08/21 Potential to Achieve Goals: Good    Frequency Min 3X/week   Barriers to discharge        Co-evaluation               AM-PAC PT "6 Clicks" Mobility  Outcome Measure Help needed turning from your back to your side while in a flat bed without using bedrails?: None Help needed moving from lying on your back to sitting on the side of a flat bed without using bedrails?: None Help needed moving to and from a bed to a chair (including a wheelchair)?: A Little Help needed standing up from a chair using your arms (e.g., wheelchair or bedside chair)?: A Little Help needed to walk in hospital room?: A Little Help needed climbing 3-5 steps with a railing? : A Little 6 Click Score: 20    End of Session Equipment Utilized During  Treatment: Gait belt Activity Tolerance: Patient tolerated treatment well Patient left: in bed;with call bell/phone within reach;with family/visitor present;Other (comment) (sitting EOB for breakfast) Nurse Communication: Mobility status PT Visit Diagnosis: Unsteadiness on feet (R26.81);Muscle weakness (generalized) (M62.81);History of falling (Z91.81);Other abnormalities of gait and mobility (R26.89)    Time: [id number] PT Time Calculation (min) (ACUTE ONLY): 29 min   Charges:   PT Evaluation $PT Eval Low Complexity: 1 Low          Acute Rehab  Pager: [phone]   Garwin Brothers, SPT  02/22/2021, 10:02 AM

## 2021-02-22 NOTE — Progress Notes (Signed)
Paged Dr. Florene Glen and advised that IV access was lost at 0942. Due to pt's discharge being canceled, IV consult will be placed by night shift nurse

## 2021-02-22 NOTE — Evaluation (Signed)
Occupational Therapy Evaluation Patient Details Name: Paula [name] MRN: [id number] DOB: 1946-05-29 Today's Date: 02/22/2021    History of Present Illness 75 y.o. presenting on 02/21/2021 with complaints of intermitten headache over last week. CT head and CXR with no acute abnormalities. Of note, on admission into ED BP elevated to 237/97. BP appeared improved and plan for d/c. After d/c pt felt weak and reportedly passed out in the bathroom for 5 minutes. Pt found to be orthostatic. PMH: hypertension, hyperlipidemia, diastolic CHF last EF 65 -[id number], cerebral aneurysm, and diet-controlled diabetes mellitus type 2 presents.   Clinical Impression   Pt admitted for concerns listed above. PTA pt reported that overall she is independent with all ADL's and IADL's, however there are days where she feels weaker and requires some assist with transfers and having someone near by for safety. This session pt presents with decreased activity tolerance and increased weakness. Overall pt is requiring supervision to min guard with all ADL's and functional mobility. Acute OT will follow to address deficits below.     Follow Up Recommendations  Home health OT;Supervision - Intermittent    Equipment Recommendations  3 in 1 bedside commode;Tub/shower bench;Other (comment) (RW)    Recommendations for Other Services       Precautions / Restrictions Precautions Precautions: Fall Restrictions Weight Bearing Restrictions: No      Mobility Bed Mobility Overal bed mobility: Modified Independent Bed Mobility: Supine to Sit     Supine to sit: HOB elevated     General bed mobility comments: HOB elevated and increased time to come to sitting.    Transfers Overall transfer level: Needs assistance Equipment used: Rolling walker (2 wheeled) Transfers: Sit to/from Stand Sit to Stand: Min guard         General transfer comment: min G for safety.    Balance Overall balance assessment: Mild deficits  observed, not formally tested Sitting-balance support: Feet supported Sitting balance-Leahy Scale: Fair     Standing balance support: During functional activity Standing balance-Leahy Scale: Fair Standing balance comment: Pt tolerates standing for BP during session without reliance of UE support or external support.                           ADL either performed or assessed with clinical judgement   ADL Overall ADL's : Needs assistance/impaired Eating/Feeding: Independent;Sitting   Grooming: Supervision/safety;Standing   Upper Body Bathing: Supervision/ safety;Sitting   Lower Body Bathing: Min guard;Sitting/lateral leans;Sit to/from stand   Upper Body Dressing : Independent;Sitting   Lower Body Dressing: Min guard;Sitting/lateral leans;Sit to/from stand   Toilet Transfer: Min guard;Ambulation   Toileting- Clothing Manipulation and Hygiene: Min guard;Sitting/lateral lean;Sit to/from stand   Tub/ Shower Transfer: Min guard;Ambulation   Functional mobility during ADLs: Min guard;Rolling walker General ADL Comments: Pt overall at supervision to min guard levels for safety due to increased weakness and decreased activity tolerance.     Vision Baseline Vision/History: Wears glasses Wears Glasses: Reading only Patient Visual Report: No change from baseline Vision Assessment?: No apparent visual deficits     Perception Perception Perception Tested?: No   Praxis Praxis Praxis tested?: Not tested    Pertinent Vitals/Pain Pain Assessment: No/denies pain     Hand Dominance Right   Extremity/Trunk Assessment Upper Extremity Assessment Upper Extremity Assessment: Overall WFL for tasks assessed   Lower Extremity Assessment Lower Extremity Assessment: Defer to PT evaluation   Cervical / Trunk Assessment Cervical / Trunk Assessment: Normal  Communication Communication Communication: No difficulties   Cognition Arousal/Alertness: Awake/alert Behavior  During Therapy: WFL for tasks assessed/performed Overall Cognitive Status: Within Functional Limits for tasks assessed                                 General Comments: pt A&O x 4   General Comments  VSS on RA. Resting BP 155/76, standing BP 133/77    Exercises     Shoulder Instructions      Home Living Family/patient expects to be discharged to:: Private residence Living Arrangements: Alone Available Help at Discharge: Family;Available PRN/intermittently Type of Home: Apartment Home Access: Stairs to enter Entrance Stairs-Number of Steps: 2 full flights Entrance Stairs-Rails: Right Home Layout: One level     Bathroom Shower/Tub: Teacher, early years/pre: Standard Bathroom Accessibility: Yes How Accessible: Accessible via walker Home Equipment: Shower seat;Toilet riser          Prior Functioning/Environment Level of Independence: Needs assistance  Gait / Transfers Assistance Needed: Pt ambulates with assistance from daughter when needed. ADL's / Homemaking Assistance Needed: Pt able to dress and bathe herself, sometimes needing assistance to step into tub.            OT Problem List: Decreased strength;Decreased activity tolerance;Impaired balance (sitting and/or standing);Decreased knowledge of use of DME or AE;Cardiopulmonary status limiting activity      OT Treatment/Interventions: Self-care/ADL training;Therapeutic exercise;Energy conservation;DME and/or AE instruction;Therapeutic activities;Patient/family education;Balance training    OT Goals(Current goals can be found in the care plan section) Acute Rehab OT Goals Patient Stated Goal: get better and go home OT Goal Formulation: With patient/family Time For Goal Achievement: 03/08/21 Potential to Achieve Goals: Good ADL Goals Pt Will Perform Grooming: with modified independence;standing Pt Will Perform Upper Body Bathing: with modified independence;sitting Pt Will Perform Lower  Body Bathing: with modified independence;sitting/lateral leans;sit to/from stand Pt Will Perform Upper Body Dressing: with modified independence;sitting Pt Will Perform Lower Body Dressing: with modified independence;sit to/from stand;sitting/lateral leans Pt Will Transfer to Toilet: with modified independence;ambulating Pt Will Perform Toileting - Clothing Manipulation and hygiene: with modified independence;sitting/lateral leans;sit to/from stand  OT Frequency: Min 2X/week   Barriers to D/C:    Pt lives alone       Co-evaluation              AM-PAC OT "6 Clicks" Daily Activity     Outcome Measure Help from another person eating meals?: None Help from another person taking care of personal grooming?: A Little Help from another person toileting, which includes using toliet, bedpan, or urinal?: A Little Help from another person bathing (including washing, rinsing, drying)?: A Little Help from another person to put on and taking off regular upper body clothing?: None Help from another person to put on and taking off regular lower body clothing?: A Little 6 Click Score: 20   End of Session Equipment Utilized During Treatment: Gait belt;Rolling walker Nurse Communication: Mobility status  Activity Tolerance: Patient tolerated treatment well Patient left: in bed;with call bell/phone within reach;with family/visitor present  OT Visit Diagnosis: Unsteadiness on feet (R26.81);Other abnormalities of gait and mobility (R26.89);Muscle weakness (generalized) (M62.81)                Time: [id number] OT Time Calculation (min): 24 min Charges:  OT General Charges $OT Visit: 1 Visit OT Evaluation $OT Eval Moderate Complexity: 1 Mod OT Treatments $Self Care/Home Management : 8-22 mins  Jennfer Gassen H., OTR/L Acute Rehabilitation  Debanhi Blaker Elane Yolanda Bonine 02/22/2021, 10:47 AM

## 2021-02-22 NOTE — Progress Notes (Signed)
PROGRESS NOTE    [name]  [id number] DOB: 04-Oct-1945 DOA: 02/21/2021 PCP: Charlott Rakes, MD    Chief Complaint  Patient presents with   Headache    Brief Narrative:  Paula [name] is Paula [name] 75 y.o. female with medical history significant of hypertension, hyperlipidemia, diastolic CHF last EF 65 -[id number], cerebral aneurysm, and diet-controlled diabetes mellitus type 2 presents with complaints of Paula [name] headache intermittently over the last week.  Pain is sometimes in the frontal part of her head and sometimes all over.  She reports the veins in her scalp began to throb and are distended and tender to the touch.  In addition to the headache patient reports that her blood pressure and blood sugars have also been elevated.  The patient had been taken off glipizide by her primary care provider 2 months ago due to recurrent low blood sugars.  However this week, blood sugars were noted to be elevated into the 180s.  She still had old prescription and took Paula [name] quarter of Paula [name] piece of glipizide, but reported that it dropped her blood sugars.  She has been taking her blood pressure medication as advised.  During her last visit with her nephrologist Dr. [name] which apparently was approximately 2 months ago she was taken off of amlodipine due to worsening her kidney function.  Patient was also possibly advised to discontinue verapamil, but she continue this medication as Dr. [name] had not told her to stop it.  Other associated symptoms include unsteady gait(last reported fall 6 months ago in Paula [name]), memory loss, and left-sided chest pain underneath her left breast.  She tried warm compresses to the left side of her chest wall without any provement in symptoms.  Patient reportedly still is able to urinate.  Patient states that she had an accident over 20 years ago requiring her to have surgery on her jaw.   Since that time reports she has this chronic cough with drainage down the back of her throat which keeps  her up at night and the only thing that is ever helped previously in the past was Sudafed.  However, she was advised to discontinue that medication. Complains of having severe pain that goes to her left ear with drainage that comes down.   Previously had tried other nasal sprays without any relief and had been seen by ENT, but did not find anything helpful.  Patient reports having Paula [name] hole in her palate that was seen by dental students at some point in time, but not totally clear.   Discussed with Dr. [name] who reports the patient had been advised to stop the verapamil due to her being on double calcium channel blockers in addition to amlodipine.   ED Course: Upon admission into the emergency department patient was Noted to be afebrile, pulse 52-87, blood pressures elevated up to 237/97, and all other vital signs maintained.  CT at the head noted no acute abnormalities.  Labs noted hemoglobin 10.5, sodium 130, BUN 50, creatinine 3.9, sedimentation rate 24, and high-sensitivity troponin 18.  Chest x-ray showed no acute abnormalities.  Patient had been given Reglan, Benadryl, Ativan 0.5 mg IV, hydralazine 20 mg IV, and home dose of Coreg given.  Blood pressure appear to be improved and plan was for discharge.  However, after patient had been discharged while going to use the bathroom patient felt weak and reportedly passed out for approximately 5 minutes per her daughter's report.  Blood pressure was noted to be 105/55 and she was  found to be orthostatic.  Patient was given 1 L normal saline IV fluids with improvement in blood pressures.   Assessment & Plan:   Principal Problem:   Hypertensive urgency Active Problems:   Hyperlipidemia   Chest pain   Chronic diastolic congestive heart failure (HCC)   Upper airway cough syndrome   Syncope   Gait disturbance   Jaw pain   Elevated troponin  Hypertensive Emergency  Headache: headache in setting of severely elevated BP's ([id number] systolic).  Suspect HA  related to blood pressure.  Temporal arteritis less likely in setting of sed rate 24.   BP's improved today, initially 120's-130's when I saw her, but in PM, back to 170's - given significant HTN at presentation, with headaches, will continue uptitration of antihypertensives here Verapamil discontinued Continue amlodipine (started yesterday in place of verapamil).  Increase coreg to 6.25 mg BID.  Continue imdur.  Start hydralazine.  Continue to adjust.  Evaluate for secondary causes of HTN -> renal artery [place], renin/aldo level, TSH Hydral IV prn for SBP>180 Echo shows severe LV hypertrophy, grade 1 diastolic dysfunction  Headache suspect related to hypertension, though she has hx of aneurysms MRI/MRA without acute intracranial abnormality - review of report of MRA motion limited, appears that aneurysms similar in appearance to previous imaging  Acute kidney injury was on chronic kidney disease stage IV: Patient presents with creatinine 3.9 with BUN 50.  Baseline creatinine had been around 3.4-3.7 in May of this year.  She is not on any diuretics.  Question if worsening of kidney function secondary to uncontrolled hypertension. -back to baseline around May, continue to monitor  -Check urinalysis - protein, no blood - follow UP/C -renal US without acute abnormality, medical renal disease -Recommend outpatient follow-up with Dr. [name]   Syncopal episode  orthostatic hypotension: Acute.  While in emergency department after being initially discharged patient had gone to the bathroom and reportedly had Antrice Pal syncopal episode with loss of consciousness for approximately 5 minutes prior to coming to per her daughter.  Suspect secondary to orthostatic hypotension given acute drop in blood pressures (possibly related to antihypertensives given in ED?).  CT scan of the brain showed no acute abnormalities. -Recheck orthostatic vital signs in [name].m.   Chest pain elevated troponin.  Patient reportedly has been  having left-sided chest pain over this last week.  Initial high-sensitivity troponin was mildly elevated at 18.  She denies any chest pain currently at this time. -troponin mildly elevated - likely demand in setting of hypertension -EKG without concerning findings -Check echocardiogram - with no RWMA, EF 65-70%, severe LVH   Left jaw pain and congestion upper airway cough syndrome: Patient reports having severe pain in her left jaw with drainage following previous trauma over 20 years ago. -Check maxillofacial CT -> stable since Q000111Q   Diastolic congestive heart failure: Chronic.  Patient does not appear to be fluid overloaded at this time. Last echocardiogram revealed EF 65-70% with grade 1 diastolic dysfunction back in 2019. -Strict intake and output -Daily weights   Gait disturbance: Patient reports feeling off balance at baseline and having difficulty getting around.  Last reported fall was approximately 6 months ago in Paula [name]. -PT/OT to eval and treat - recommending HH -Transitions of care for possible need of home health   Anemia of chronic disease: Hemoglobin 10.5 g/dL which appears around patient's baseline. -Recheck hemoglobin in Enrique Weiss.m.   History of diabetes mellitus type 2: Last point-of-care hemoglobin A1c was 5.8 on 01/04/2021.  Patient  is not on any diabetic medications.  She had reported blood sugars elevated up to 180 at home this week where she took Jency Schnieders quarter of glipizide, but then subsequently had low blood sugars. -Hypoglycemic protocol -CBGs before every meal with very sensitive SSI x1 day   Memory issues: Daughter reports that patient's short-term memory has been declining. -Likely needs follow-up in the outpatient setting   History of cerebral aneurysms: Patient just recently had MRI and 12/2020 that noted stable cerebral aneurysms followed by Dr. Mickeal Skinner. -Continue outpatient follow-up with Dr. Mickeal Skinner? (Will need to follow up)   Insomnia -Melatonin   DVT  prophylaxis: heparin Code Status: full  Family Communication: daughter at bedside Disposition:   Status is: Observation  The patient will require care spanning > 2 midnights and should be moved to inpatient because: Inpatient level of care appropriate due to severity of illness  Dispo: The patient is from: Home              Anticipated d/c is to:  pending              Patient currently is not medically stable to d/c.   Difficult to place patient No       Consultants:  none  Procedures:  Echo IMPRESSIONS     1. Left ventricular ejection fraction, by estimation, is 65 to 70%. The  left ventricle has hyperdynamic function. The left ventricle has no  regional wall motion abnormalities. There is severe left ventricular  hypertrophy. Left ventricular diastolic  parameters are consistent with Grade I diastolic dysfunction (impaired  relaxation).   2. Right ventricular systolic function is normal. The right ventricular  size is normal. There is normal pulmonary artery systolic pressure. The  estimated right ventricular systolic pressure is Q000111Q mmHg.   3. Left atrial size was mildly dilated.   4. The mitral valve is normal in structure. No evidence of mitral valve  regurgitation. No evidence of mitral stenosis.   5. The aortic valve is tricuspid. Aortic valve regurgitation is trivial.  Mild aortic valve sclerosis is present, with no evidence of aortic valve  stenosis.   6. The inferior vena cava is normal in size with greater than 50%  respiratory variability, suggesting right atrial pressure of 3 mmHg.  Antimicrobials:  Anti-infectives (From admission, onward)    None          Subjective: HA has improved overall  Objective: Vitals:   02/22/21 0834 02/22/21 0935 02/22/21 1509 02/22/21 1639  BP: (!) 189/87 (!) 179/82 (!) 158/87 (!) 165/76  Pulse: 71 78 73 73  Resp: 16  18   Temp: 98.3 F (36.8 C)  (!) 97.5 F (36.4 C)   TempSrc: Oral  Oral   SpO2: 98%  100%    Weight:      Height:        Intake/Output Summary (Last 24 hours) at 02/22/2021 1643 Last data filed at 02/22/2021 1334 Gross per 24 hour  Intake 150 ml  Output --  Net 150 ml   Filed Weights   02/21/21 0310 02/21/21 0323 02/22/21 0550  Weight: 63 kg 63 kg 61.4 kg    Examination:  General exam: Appears calm and comfortable  Respiratory system: Clear to auscultation. Respiratory effort normal. Cardiovascular system: S1 & S2 heard, RRR.  Gastrointestinal system: Abdomen is nondistended, soft and nontender.  Central nervous system: Alert and oriented. No focal neurological deficits. Extremities: no LEE Skin: No rashes, lesions or ulcers Psychiatry: Judgement and insight  appear normal. Mood & affect appropriate.     Data Reviewed: I have personally reviewed following labs and imaging studies  CBC: Recent Labs  Lab 02/21/21 0332 02/21/21 0538 02/22/21 0205  WBC 6.1  --  6.2  NEUTROABS 3.6  --   --   HGB 10.5* 10.9* 10.3*  HCT 29.7* 32.0* 28.1*  MCV 83.7  --  83.1  PLT 209  --  [id number]    Basic Metabolic Panel: Recent Labs  Lab 02/21/21 0332 02/21/21 0538 02/22/21 0205  NA 130* 129* 134*  K 4.5 3.6 4.0  CL 91* 92* 101  CO2 29  --  27  GLUCOSE 115* 141* 157*  BUN 50* 46* 47*  CREATININE 3.90* 4.00* 3.56*  CALCIUM 9.4  --  8.6*  PHOS  --   --  4.9*    GFR: Estimated Creatinine Clearance: 12 mL/min (Froylan Hobby) (by C-G formula based on SCr of 3.56 mg/dL (H)).  Liver Function Tests: Recent Labs  Lab 02/22/21 0205  ALBUMIN 3.0*    CBG: Recent Labs  Lab 02/21/21 1803 02/21/21 2107 02/22/21 0758 02/22/21 1127 02/22/21 1600  GLUCAP 129* 130* 109* 160* 172*     Recent Results (from the past 240 hour(s))  Resp Panel by RT-PCR (Flu Thaxton Pelley&B, Covid) Nasopharyngeal Swab     Status: None   Collection Time: 02/21/21  6:24 AM   Specimen: Nasopharyngeal Swab; Nasopharyngeal(NP) swabs in vial transport medium  Result Value Ref Range Status   SARS Coronavirus 2 by RT PCR  NEGATIVE NEGATIVE Final    Comment: (NOTE) SARS-CoV-2 target nucleic acids are NOT DETECTED.  The SARS-CoV-2 RNA is generally detectable in upper respiratory specimens during the acute phase of infection. The lowest concentration of SARS-CoV-2 viral copies this assay can detect is 138 copies/mL. Xaiver Roskelley negative result does not preclude SARS-Cov-2 infection and should not be used as the sole basis for treatment or other patient management decisions. Chasya Zenz negative result may occur with  improper specimen collection/handling, submission of specimen other than nasopharyngeal swab, presence of viral mutation(s) within the areas targeted by this assay, and inadequate number of viral copies(<138 copies/mL). Shaynna Husby negative result must be combined with clinical observations, patient history, and epidemiological information. The expected result is Negative.  Fact Sheet for Patients:  EntrepreneurPulse.com.au  Fact Sheet for Healthcare Providers:  IncredibleEmployment.be  This test is no t yet approved or cleared by the [place] FDA and  has been authorized for detection and/or diagnosis of SARS-CoV-2 by FDA under an Emergency Use Authorization (EUA). This EUA will remain  in effect (meaning this test can be used) for the duration of the COVID-19 declaration under Section 564(b)(1) of the Act, 21 U.S.C.section 360bbb-3(b)(1), unless the authorization is terminated  or revoked sooner.       Influenza Daniil Labarge by PCR NEGATIVE NEGATIVE Final   Influenza B by PCR NEGATIVE NEGATIVE Final    Comment: (NOTE) The Xpert Xpress SARS-CoV-2/FLU/RSV plus assay is intended as an aid in the diagnosis of influenza from Nasopharyngeal swab specimens and should not be used as Kyndal Heringer sole basis for treatment. Nasal washings and aspirates are unacceptable for Xpert Xpress SARS-CoV-2/FLU/RSV testing.  Fact Sheet for Patients: EntrepreneurPulse.com.au  Fact Sheet for  Healthcare Providers: IncredibleEmployment.be  This test is not yet approved or cleared by the [place] FDA and has been authorized for detection and/or diagnosis of SARS-CoV-2 by FDA under an Emergency Use Authorization (EUA). This EUA will remain in effect (meaning this test can be used) for  the duration of the COVID-19 declaration under Section 564(b)(1) of the Act, 21 U.S.C. section 360bbb-3(b)(1), unless the authorization is terminated or revoked.  Performed at [facility], [place] [address] [place], [place] [address]          Radiology Studies: CT Head Wo Contrast  Result Date: 02/21/2021 CLINICAL DATA:  75 year old female with history of headache. Intracranial hemorrhage suspected. EXAM: CT HEAD WITHOUT CONTRAST TECHNIQUE: Contiguous axial images were obtained from the base of the skull through the vertex without intravenous contrast. COMPARISON:  Head CT 12/29/2020. FINDINGS: Brain: Patchy and confluent areas of decreased attenuation are noted throughout the deep and periventricular white matter of the cerebral hemispheres bilaterally, compatible with chronic microvascular ischemic disease. No evidence of acute infarction, hemorrhage, hydrocephalus, extra-axial collection or mass lesion/mass effect. Vascular: No hyperdense vessel or unexpected calcification. Skull: Normal. Negative for fracture or focal lesion. Sinuses/Orbits: No acute finding. Other: None. IMPRESSION: 1. No acute intracranial abnormalities. 2. Chronic microvascular ischemic changes in the cerebral white matter, as above. Electronically Signed   By: Vinnie Langton M.D.   On: 02/21/2021 07:02   MR ANGIO HEAD WO CONTRAST  Result Date: 02/22/2021 CLINICAL DATA:  Cerebral aneurysm follow-up.  Headaches. EXAM: MRI HEAD WITHOUT CONTRAST MRA HEAD WITHOUT CONTRAST TECHNIQUE: Multiplanar, multi-echo pulse sequences of the brain and surrounding structures were acquired without intravenous  contrast. Angiographic images of the Circle of Willis were acquired using MRA technique without intravenous contrast. COMPARISON:  Dec 31, 2020. FINDINGS: MRI HEAD FINDINGS Brain: No acute infarction, hemorrhage, hydrocephalus, extra-axial collection or mass lesion. Mild to moderate for age patchy white matter T2/FLAIR hyperintensity, most likely related to chronic microvascular ischemic disease. Mild to moderate for age atrophy with ex vacuo ventricular dilation. Vascular: Major arterial flow voids are maintained skull base. Skull and upper cervical spine: Normal marrow signal. Sinuses/Orbits: Mild paranasal sinus mucosal thickening. No acute orbital findings. Other: No mastoid effusions. MRA HEAD FINDINGS Motion limited evaluation.  Within this limitation: Anterior circulation: Similar appearance of an approximately 3 mm medially projecting aneurysm arising from the proximal left cavernous ICA (series 5, image 75). Also, similar approximately 3 mm inferiorly projecting aneurysm arising from the supraclinoid left ICA (series 5, image 91; series 1049, image 194). Suspected additional approximately 2-3 mm anterosuperiorly directed is aneurysm from the supraclinoid left ICA (series 5, image 99; series 1049, image 164) which appears similar to prior. Mild bilateral ICA atherosclerotic narrowing. Similar ectatic left A1 ACA with hypoplastic right A1 ACA. Bilateral ICAs, MCAs, and ACAs are patent. Posterior circulation: Bilateral intradural vertebral arteries, basilar artery and posterior. Cerebral arteries are patent. 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 Otherwise, multifocal mild stenosis of these vessels without evidence of Raevyn Sokol high-grade stenosis. Posterior communicating arteries are hypoplastic or absent bilaterally. IMPRESSION: MRI: 1. No evidence of acute intracranial abnormality. 2. Mild-to-moderate for age chronic microvascular ischemic disease and atrophy. MRA: 1. Motion limited  evaluation with similar appearance of 3 mm medially directed aneurysm arising from the proximal left cavernous ICA and 3 mm inferiorly projecting aneurysm arising from the supraclinoid left ICA. 2. Suspected additional 2-3 mm anterosuperiorly directed aneurysm arising from the supraclinoid left ICA, which also appears similar. 3. Similar ectatic left A1 ACA. 4. Similar atherosclerotic disease with moderate basilar artery stenosis an additional multifocal mild stenosis. Mild for age chronic microvascular ischemic disease and atrophy. Electronically Signed   By: Margaretha Sheffield MD   On: 02/22/2021 13:25   MR BRAIN WO CONTRAST  Result Date: 02/22/2021 CLINICAL DATA:  Cerebral aneurysm follow-up.  Headaches. EXAM: MRI HEAD WITHOUT CONTRAST MRA HEAD WITHOUT CONTRAST TECHNIQUE: Multiplanar, multi-echo pulse sequences of the brain and surrounding structures were acquired without intravenous contrast. Angiographic images of the Circle of Willis were acquired using MRA technique without intravenous contrast. COMPARISON:  Dec 31, 2020. FINDINGS: MRI HEAD FINDINGS Brain: No acute infarction, hemorrhage, hydrocephalus, extra-axial collection or mass lesion. Mild to moderate for age patchy white matter T2/FLAIR hyperintensity, most likely related to chronic microvascular ischemic disease. Mild to moderate for age atrophy with ex vacuo ventricular dilation. Vascular: Major arterial flow voids are maintained skull base. Skull and upper cervical spine: Normal marrow signal. Sinuses/Orbits: Mild paranasal sinus mucosal thickening. No acute orbital findings. Other: No mastoid effusions. MRA HEAD FINDINGS Motion limited evaluation.  Within this limitation: Anterior circulation: Similar appearance of an approximately 3 mm medially projecting aneurysm arising from the proximal left cavernous ICA (series 5, image 75). Also, similar approximately 3 mm inferiorly projecting aneurysm arising from the supraclinoid left ICA (series 5,  image 91; series 1049, image 194). Suspected additional approximately 2-3 mm anterosuperiorly directed is aneurysm from the supraclinoid left ICA (series 5, image 99; series 1049, image 164) which appears similar to prior. Mild bilateral ICA atherosclerotic narrowing. Similar ectatic left A1 ACA with hypoplastic right A1 ACA. Bilateral ICAs, MCAs, and ACAs are patent. Posterior circulation: Bilateral intradural vertebral arteries, basilar artery and posterior. Cerebral arteries are patent. 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 Otherwise, multifocal mild stenosis of these vessels without evidence of Vanessa Alesi high-grade stenosis. Posterior communicating arteries are hypoplastic or absent bilaterally. IMPRESSION: MRI: 1. No evidence of acute intracranial abnormality. 2. Mild-to-moderate for age chronic microvascular ischemic disease and atrophy. MRA: 1. Motion limited evaluation with similar appearance of 3 mm medially directed aneurysm arising from the proximal left cavernous ICA and 3 mm inferiorly projecting aneurysm arising from the supraclinoid left ICA. 2. Suspected additional 2-3 mm anterosuperiorly directed aneurysm arising from the supraclinoid left ICA, which also appears similar. 3. Similar ectatic left A1 ACA. 4. Similar atherosclerotic disease with moderate basilar artery stenosis an additional multifocal mild stenosis. Mild for age chronic microvascular ischemic disease and atrophy. Electronically Signed   By: Margaretha Sheffield MD   On: 02/22/2021 13:25   US RENAL  Result Date: 02/21/2021 CLINICAL DATA:  AK I EXAM: RENAL / URINARY TRACT ULTRASOUND COMPLETE COMPARISON:  June 18, 2015. FINDINGS: Right Kidney: Renal measurements: 6.8 x 3.8 x 3.7 cm = volume: 50 mL. Increased echogenicity. No mass or hydronephrosis visualized. Left Kidney: Renal measurements: 8.3.8 x 3.4 cm = volume: 69 mL. Increased cortical echogenicity. No mass or hydronephrosis visualized. Bladder: Decompressed  Other: None. IMPRESSION: 1. No acute abnormality. No evidence of obstructive uropathy. 2. Bilateral renal atrophy with increased cortical echogenicity as can be seen in medical renal disease. Electronically Signed   By: Dahlia Bailiff MD   On: 02/21/2021 23:29   DG Chest Port 1 View  Result Date: 02/21/2021 CLINICAL DATA:  75 year old female with cough, hypertension, shortness of breath. EXAM: PORTABLE CHEST 1 VIEW COMPARISON:  Chest radiographs 09/20/2018 and earlier. FINDINGS: Portable AP upright view at 0616 hours. Lung volumes and mediastinal contours remain within normal limits; mild tortuosity of the descending aorta. Visualized tracheal air column is within normal limits. Allowing for portable technique the lungs are clear. No pneumothorax or pleural effusion. No acute osseous abnormality identified. IMPRESSION: Negative portable chest. Electronically Signed   By: Lemmie Evens  [place] Crane M.D.   On: 02/21/2021 06:26   ECHOCARDIOGRAM LIMITED  Result Date: 02/22/2021    ECHOCARDIOGRAM LIMITED REPORT   Patient Name:   KAYDIE IVINS Date of Exam: 02/22/2021 Medical Rec #:  [id number]           Height:       62.0 in Accession #:    [id number]          Weight:       135.4 lb Date of Birth:  1945/11/02           BSA:          1.619 m Patient Age:    2 years            BP:           170/81 mmHg Patient Gender: F                   HR:           76 bpm. Exam Location:  Inpatient Procedure: Limited Echo, Limited Color Doppler and Cardiac Doppler Indications:    Syncope  History:        Patient has prior history of Echocardiogram examinations, most                 recent 09/02/2017. CHF; Risk Factors:Diabetes, Dyslipidemia and                 Hypertension.  Sonographer:    [place] Referring Phys: [id number] [place]  1. Left ventricular ejection fraction, by estimation, is 65 to 70%. The left ventricle has hyperdynamic function. The left ventricle has no regional wall motion abnormalities. There is  severe left ventricular hypertrophy. Left ventricular diastolic parameters are consistent with Grade I diastolic dysfunction (impaired relaxation).  2. Right ventricular systolic function is normal. The right ventricular size is normal. There is normal pulmonary artery systolic pressure. The estimated right ventricular systolic pressure is Q000111Q mmHg.  3. Left atrial size was mildly dilated.  4. The mitral valve is normal in structure. No evidence of mitral valve regurgitation. No evidence of mitral stenosis.  5. The aortic valve is tricuspid. Aortic valve regurgitation is trivial. Mild aortic valve sclerosis is present, with no evidence of aortic valve stenosis.  6. The inferior vena cava is normal in size with greater than 50% respiratory variability, suggesting right atrial pressure of 3 mmHg. FINDINGS  Left Ventricle: Left ventricular ejection fraction, by estimation, is 65 to 70%. The left ventricle has hyperdynamic function. The left ventricle has no regional wall motion abnormalities. The left ventricular internal cavity size was normal in size. There is severe left ventricular hypertrophy. Left ventricular diastolic parameters are consistent with Grade I diastolic dysfunction (impaired relaxation). Right Ventricle: The right ventricular size is normal. No increase in right ventricular wall thickness. Right ventricular systolic function is normal. There is normal pulmonary artery systolic pressure. The tricuspid regurgitant velocity is 2.21 m/s, and  with an assumed right atrial pressure of 3 mmHg, the estimated right ventricular systolic pressure is [id number] mmHg. Left Atrium: Left atrial size was mildly dilated. Right Atrium: Right atrial size was normal in size. Pericardium: Trivial pericardial effusion is present. Mitral Valve: The mitral valve is normal in structure. No evidence of mitral valve stenosis. Tricuspid Valve: The tricuspid valve is normal in structure. Tricuspid valve regurgitation is trivial.  Aortic Valve: The aortic valve is tricuspid. Aortic valve regurgitation is trivial. Aortic regurgitation PHT measures 481  msec. Mild aortic valve sclerosis is present, with no evidence of aortic valve stenosis. Aortic valve mean gradient measures 5.5 mmHg. Aortic valve peak gradient measures 9.6 mmHg. Pulmonic Valve: The pulmonic valve was normal in structure. Pulmonic valve regurgitation is not visualized. Aorta: The aortic root is normal in size and structure. Venous: The inferior vena cava is normal in size with greater than 50% respiratory variability, suggesting right atrial pressure of 3 mmHg. LEFT VENTRICLE PLAX 2D LVIDd:         3.00 cm LVIDs:         1.10 cm LV PW:         1.10 cm LV IVS:        1.60 cm LVOT diam:     2.10 cm LVOT Area:     3.46 cm  LV Volumes (MOD) LV vol d, MOD A2C: 25.0 ml LV vol d, MOD A4C: 38.2 ml LV vol s, MOD A2C: 5.7 ml LV vol s, MOD A4C: 12.8 ml LV SV MOD A2C:     19.3 ml LV SV MOD A4C:     38.2 ml LV SV MOD BP:      23.0 ml RIGHT VENTRICLE RV S prime:     13.20 cm/s TAPSE (M-mode): 1.8 cm LEFT ATRIUM             Index LA Vol (A2C):   25.9 ml 15.99 ml/m LA Vol (A4C):   54.3 ml 33.53 ml/m LA Biplane Vol: 37.6 ml 23.22 ml/m  AORTIC VALVE               PULMONIC VALVE AV Vmax:      155.00 cm/s  PV Vmax:       1.25 m/s AV Vmean:     111.500 cm/s PV Vmean:      90.200 cm/s AV VTI:       0.356 m      PV VTI:        0.224 m AV Peak Grad: 9.6 mmHg     PV Peak grad:  6.2 mmHg AV Mean Grad: 5.5 mmHg     PV Mean grad:  4.0 mmHg AI PHT:       481 msec  AORTA Ao Root diam: 3.10 cm Ao Asc diam:  3.30 cm MITRAL VALVE                TRICUSPID VALVE MV Area (PHT): 2.62 cm     TR Peak grad:   19.5 mmHg MV Decel Time: 290 msec     TR Vmax:        221.00 cm/s MV E velocity: 68.80 cm/s MV Chardae Mulkern velocity: 108.00 cm/s  SHUNTS MV E/Chinonso Linker ratio:  0.64         Systemic Diam: 2.10 cm Loralie Champagne MD Electronically signed by Loralie Champagne MD Signature Date/Time: 02/22/2021/11:51:12 AM    Final    CT  MAXILLOFACIAL WO CONTRAST  Result Date: 02/21/2021 CLINICAL DATA:  Facial fracture.  Left jaw pain and congestion. EXAM: CT MAXILLOFACIAL WITHOUT CONTRAST TECHNIQUE: Multidetector CT imaging of the maxillofacial structures was performed. Multiplanar CT image reconstructions were also generated. COMPARISON:  07/21/2018 FINDINGS: Osseous: No fracture or mandibular dislocation. No destructive process. Orbits: Negative. No traumatic or inflammatory finding. Sinuses: Clear. Soft tissues: Negative. Limited intracranial: No significant or unexpected finding. IMPRESSION: Stable since 07/21/2018.  No acute bony abnormality. Paranasal sinuses and mastoid air cells are clear bilaterally. No evidence for acute or chronic sinusitis. Electronically Signed  By: Misty Stanley M.D.   On: 02/21/2021 18:53        Scheduled Meds:  amLODipine  10 mg Oral Daily   aspirin EC  81 mg Oral QPM   carvedilol  3.125 mg Oral Once   [START ON 02/23/2021] carvedilol  6.25 mg Oral BID WC   fluticasone  2 spray Each Nare Daily   heparin  5,000 Units Subcutaneous Q8H   hydrALAZINE  10 mg Oral Q8H   insulin aspart  0-6 Units Subcutaneous TID WC   isosorbide mononitrate  60 mg Oral Daily   melatonin  3 mg Oral QHS   sodium chloride flush  3 mL Intravenous Q12H   Continuous Infusions:   LOS: 0 days    Time spent: over 30 min    Fayrene Helper, MD [facility]   To contact the attending provider between 7A-7P or the covering provider during after hours 7P-7A, please log into the web site [email or web address] and access using universal [place] password for that web site. If you do not have the password, please call the hospital operator.  02/22/2021, 4:43 PM

## 2021-02-22 NOTE — Progress Notes (Signed)
*  PRELIMINARY RESULTS* Echocardiogram 2D Echocardiogram has been performed.  Luisa Hart RDCS 02/22/2021, 8:31 AM

## 2021-02-23 ENCOUNTER — Inpatient Hospital Stay (HOSPITAL_COMMUNITY): Payer: Medicaid Other

## 2021-02-23 DIAGNOSIS — I10 Essential (primary) hypertension: Secondary | ICD-10-CM

## 2021-02-23 LAB — CBC WITH DIFFERENTIAL/PLATELET
Abs Immature Granulocytes: 0.02 10*3/uL (ref 0.00–0.07)
Basophils Absolute: 0 10*3/uL (ref 0.0–0.1)
Basophils Relative: 1 %
Eosinophils Absolute: 0.2 10*3/uL (ref 0.0–0.5)
Eosinophils Relative: 4 %
HCT: 26.8 % — ABNORMAL LOW (ref 36.0–46.0)
Hemoglobin: 9.9 g/dL — ABNORMAL LOW (ref 12.0–15.0)
Immature Granulocytes: 0 %
Lymphocytes Relative: 24 %
Lymphs Abs: 1.4 10*3/uL (ref 0.7–4.0)
MCH: 30.4 pg (ref 26.0–34.0)
MCHC: 36.9 g/dL — ABNORMAL HIGH (ref 30.0–36.0)
MCV: 82.2 fL (ref 80.0–100.0)
Monocytes Absolute: 0.6 10*3/uL (ref 0.1–1.0)
Monocytes Relative: 10 %
Neutro Abs: 3.6 10*3/uL (ref 1.7–7.7)
Neutrophils Relative %: 61 %
Platelets: 184 10*3/uL (ref 150–400)
RBC: 3.26 MIL/uL — ABNORMAL LOW (ref 3.87–5.11)
RDW: 12.3 % (ref 11.5–15.5)
WBC: 5.9 10*3/uL (ref 4.0–10.5)
nRBC: 0 % (ref 0.0–0.2)

## 2021-02-23 LAB — GLUCOSE, CAPILLARY
Glucose-Capillary: 113 mg/dL — ABNORMAL HIGH (ref 70–99)
Glucose-Capillary: 168 mg/dL — ABNORMAL HIGH (ref 70–99)
Glucose-Capillary: 130 mg/dL — ABNORMAL HIGH (ref 70–99)

## 2021-02-23 LAB — COMPREHENSIVE METABOLIC PANEL
ALT: 14 U/L (ref 0–44)
AST: 16 U/L (ref 15–41)
Albumin: 3.1 g/dL — ABNORMAL LOW (ref 3.5–5.0)
Alkaline Phosphatase: 59 U/L (ref 38–126)
Anion gap: 11 (ref 5–15)
BUN: 48 mg/dL — ABNORMAL HIGH (ref 8–23)
CO2: 23 mmol/L (ref 22–32)
Calcium: 8.7 mg/dL — ABNORMAL LOW (ref 8.9–10.3)
Chloride: 99 mmol/L (ref 98–111)
Creatinine, Ser: 3.58 mg/dL — ABNORMAL HIGH (ref 0.44–1.00)
GFR, Estimated: 13 mL/min — ABNORMAL LOW (ref >60)
Glucose, Bld: 111 mg/dL — ABNORMAL HIGH (ref 70–99)
Potassium: 3.6 mmol/L (ref 3.5–5.1)
Sodium: 133 mmol/L — ABNORMAL LOW (ref 135–145)
Total Bilirubin: 0.6 mg/dL (ref 0.3–1.2)
Total Protein: 6 g/dL — ABNORMAL LOW (ref 6.5–8.1)

## 2021-02-23 LAB — PHOSPHORUS: Phosphorus: 4.8 mg/dL — ABNORMAL HIGH (ref 2.5–4.6)

## 2021-02-23 LAB — MAGNESIUM: Magnesium: 2 mg/dL (ref 1.7–2.4)

## 2021-02-23 MED ORDER — HYDRALAZINE HCL 10 MG PO TABS
10.0000 mg | ORAL_TABLET | Freq: Once | ORAL | Status: AC
  Administered 2021-02-23: 10 mg via ORAL
  Filled 2021-02-23: qty 1

## 2021-02-23 MED ORDER — CARVEDILOL 12.5 MG PO TABS
12.5000 mg | ORAL_TABLET | Freq: Two times a day (BID) | ORAL | Status: DC
  Administered 2021-02-23: 12.5 mg via ORAL
  Filled 2021-02-23: qty 1

## 2021-02-23 MED ORDER — POLYETHYLENE GLYCOL 3350 17 G PO PACK
17.0000 g | PACK | Freq: Two times a day (BID) | ORAL | Status: DC
  Administered 2021-02-23: 17 g via ORAL
  Filled 2021-02-23: qty 1

## 2021-02-23 MED ORDER — POLYVINYL ALCOHOL 1.4 % OP SOLN
1.0000 [drp] | OPHTHALMIC | Status: DC | PRN
  Administered 2021-02-23: 1 [drp] via OPHTHALMIC
  Filled 2021-02-23: qty 15

## 2021-02-23 MED ORDER — CARBAMIDE PEROXIDE 6.5 % OT SOLN
5.0000 [drp] | Freq: Two times a day (BID) | OTIC | Status: DC
  Administered 2021-02-23: 5 [drp] via OTIC
  Filled 2021-02-23: qty 15

## 2021-02-23 MED ORDER — CARVEDILOL 6.25 MG PO TABS
6.2500 mg | ORAL_TABLET | Freq: Once | ORAL | Status: AC
  Administered 2021-02-23: 6.25 mg via ORAL
  Filled 2021-02-23: qty 1

## 2021-02-23 MED ORDER — AMLODIPINE BESYLATE 10 MG PO TABS
10.0000 mg | ORAL_TABLET | Freq: Every day | ORAL | 0 refills | Status: DC
  Filled 2021-02-23: qty 30, 30d supply, fill #0

## 2021-02-23 MED ORDER — CARVEDILOL 12.5 MG PO TABS: 12.5000 mg | ORAL_TABLET | Freq: Two times a day (BID) | ORAL | 0 refills | Status: DC

## 2021-02-23 MED ORDER — HYDRALAZINE HCL 25 MG PO TABS
25.0000 mg | ORAL_TABLET | Freq: Three times a day (TID) | ORAL | Status: DC
  Administered 2021-02-23: 25 mg via ORAL
  Filled 2021-02-23: qty 1

## 2021-02-23 MED ORDER — AMLODIPINE BESYLATE 10 MG PO TABS: 10.0000 mg | ORAL_TABLET | Freq: Every day | ORAL | 0 refills | Status: DC

## 2021-02-23 MED ORDER — HYDRALAZINE HCL 25 MG PO TABS: 25.0000 mg | ORAL_TABLET | Freq: Three times a day (TID) | ORAL | 0 refills | Status: DC

## 2021-02-23 MED ORDER — HYDRALAZINE HCL 25 MG PO TABS
25.0000 mg | ORAL_TABLET | Freq: Three times a day (TID) | ORAL | 0 refills | Status: DC
  Filled 2021-02-23: qty 90, 30d supply, fill #0

## 2021-02-23 MED ORDER — CARVEDILOL 12.5 MG PO TABS
12.5000 mg | ORAL_TABLET | Freq: Two times a day (BID) | ORAL | 0 refills | Status: DC
  Filled 2021-02-23: qty 60, 30d supply, fill #0

## 2021-02-23 NOTE — Discharge Summary (Addendum)
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 with vascular outpatient  Follow up aneurysms outpatient  Follow with nephrology outpatient Follow with vascular outpatient Follow with ENT outpatient  Follow UP/C outpatient   Discharge Diagnoses:  Principal Problem:   Hypertensive urgency Active Problems:   Hyperlipidemia   Chest pain   Chronic diastolic congestive heart failure ([place])   Upper airway cough syndrome   Syncope   Gait disturbance   Jaw pain   Elevated troponin   Hypertensive emergency   Discharge Condition: stable   Diet recommendation: heart healthy  Filed Weights   02/21/21 0323 02/22/21 0550 02/23/21 0628  Weight: 63 kg 61.4 kg 59.8 kg    History of present illness:  Paula [name] 75 y.o. female with medical history significant of hypertension, hyperlipidemia, diastolic CHF last EF 65 -[id number], cerebral aneurysm, and diet-controlled diabetes mellitus type 2 presents with complaints of Paula [name] headache intermittently over the last week.  Pain is sometimes in the frontal part of her head and sometimes all over.  She reports the veins in her scalp began to throb and are distended and tender to the touch.  In addition to the headache patient reports that her blood pressure and blood sugars have also been elevated.  The patient had been taken off glipizide by her primary care provider 2 months ago due to recurrent low blood sugars.  However this week, blood sugars were noted to be elevated into the 180s.  She still had old prescription and took Paula [name] quarter of Paula [name] piece of glipizide, but reported that it dropped her blood sugars.  She has been taking her  blood pressure medication as advised.  During her last visit with her nephrologist Paula [name] which apparently was approximately 2 months ago she was taken off of amlodipine due to worsening her kidney function.  Patient was also possibly advised to discontinue verapamil, but she continue this medication as Paula [name] had not told her to stop it.  Other associated symptoms include unsteady gait(last reported fall 6 months ago in [name]), memory loss, and left-sided chest pain underneath her left breast.  She tried warm compresses to the left side of her chest wall without any provement in symptoms.  Patient reportedly still is able to urinate.  Patient states that she had an accident over 20 years ago requiring her to have surgery on her jaw.   Since that time reports she has this chronic cough with drainage down the back of her throat which keeps her up at night and the only thing that is ever helped previously in the past was Sudafed.  However, she was advised to discontinue that medication. Complains of having severe pain that goes to her left ear with drainage that comes down.   Previously had tried other nasal sprays without any relief and had been seen by ENT, but did not find anything helpful.  Patient reports having Paula [name] hole in her palate that was seen by dental students at some point in time, but not totally clear.   Discussed with Paula [name] who reports the patient had been advised to stop the verapamil due to her being on double calcium channel blockers in addition to amlodipine.   ED Course: Upon  admission into the emergency department patient was Noted to be afebrile, pulse 52-87, blood pressures elevated up to 237/97, and all other vital signs maintained.  CT at the head noted no acute abnormalities.  Labs noted hemoglobin 10.5, sodium 130, BUN 50, creatinine 3.9, sedimentation rate 24, and high-sensitivity troponin 18.  Chest x-ray showed no acute abnormalities.  Patient had been given Reglan,  Benadryl, Ativan 0.5 mg IV, hydralazine 20 mg IV, and home dose of Coreg given.  Blood pressure appear to be improved and plan was for discharge.  However, after patient had been discharged while going to use the bathroom patient felt weak and reportedly passed out for approximately 5 minutes per her daughter's report.  Blood pressure was noted to be 105/55 and she was found to be orthostatic.  Patient was given 1 L normal saline IV fluids with improvement in blood pressures.  She was admitted with hypertensive emergency with significantly elevated BP and headaches.  She's improved at this time with adjustments of medications with improved BP and asymptomatic.  Hospitalization c/b syncope, possibly due to orthostatic hypotension.  Appropriate for discharge on 7/16.    Hospital Course:  Hypertensive Emergency  Headache: headache in setting of severely elevated BP's ([id number] systolic).  Suspect HA related to blood pressure.  Temporal arteritis less likely in setting of sed rate 24.   BP improved with uptitration - discharge with coreg 12.5 mg BID, hydralazine 25 mg TID, amlodipine 10 mg, imdur 60 mg.  Verapamil discontinued Evaluate for secondary causes of HTN -> renal artery [place] - no renal artery stenosis, renin/aldo level pending, TSH wnl Echo shows severe LV hypertrophy, grade 1 diastolic dysfunction   Headache suspect related to hypertension, though she has hx of aneurysms MRI/MRA without acute intracranial abnormality - review of report of MRA motion limited, appears that aneurysms similar in appearance to previous imaging HA often related to chronic cough, per discussion with pt/daughter -> ent referral (see below)   Acute kidney injury was on chronic kidney disease stage IV: Patient presents with creatinine 3.9 with BUN 50.  Baseline creatinine had been around 3.4-3.7 in May of this year.  She is not on any diuretics.  Question if worsening of kidney function secondary to uncontrolled  hypertension. -back to baseline around May, continue to monitor  -Check urinalysis - protein, no blood - follow UP/C outaptient -renal US without acute abnormality, medical renal disease -Recommend outpatient follow-up with Paula [name]   Syncopal episode  orthostatic hypotension: Acute.  While in emergency department after being initially discharged patient had gone to the bathroom and reportedly had Jeremaine Maraj syncopal episode with loss of consciousness for approximately 5 minutes prior to coming to per her daughter.  Suspect secondary to orthostatic hypotension given acute drop in blood pressures (possibly related to antihypertensives given in ED?).  CT scan of the brain showed no acute abnormalities. Orthostatics notable for decrease in BP, but significantly elevated BP on repeat - seems improved EKG sinus, 1st degree AV block Seems improved, follow outpatient    Chest pain elevated troponin.  Patient reportedly has been having left-sided chest pain over this last week.  Initial high-sensitivity troponin was mildly elevated at 18.  She denies any chest pain currently at this time. -troponin mildly elevated - likely demand in setting of hypertension -EKG without concerning findings -Check echocardiogram - with no RWMA, EF 65-70%, severe LVH   Left jaw pain and congestion upper airway cough syndrome: Patient reports having severe pain in her left  jaw with drainage following previous trauma over 20 years ago. -Check maxillofacial CT -> stable since 07/2018 -ENT referral    Diastolic congestive heart failure: Chronic.  Patient does not appear to be fluid overloaded at this time. Last echocardiogram revealed EF 65-70% with grade 1 diastolic dysfunction back in 2019. -Strict intake and output -Daily weights   Gait disturbance: Patient reports feeling off balance at baseline and having difficulty getting around.  Last reported fall was approximately 6 months ago in [name]. -PT/OT to eval and treat -  recommending HH -Transitions of care for possible need of home health   Anemia of chronic disease: Hemoglobin 10.5 g/dL which appears around patient's baseline. -Recheck hemoglobin in Naveyah Iacovelli.m.   History of diabetes mellitus type 2: Last point-of-care hemoglobin A1c was 5.8 on 01/04/2021.  Patient is not on any diabetic medications.  She had reported blood sugars elevated up to 180 at home this week where she took Amen Dargis quarter of glipizide, but then subsequently had low blood sugars. -follow outpatient   Memory issues: Daughter reports that patient's short-term memory has been declining. -Likely needs follow-up in the outpatient setting   History of cerebral aneurysms: Patient just recently had MRI and 12/2020 that noted stable cerebral aneurysms followed by Dr. Bronson Curb. -Continue outpatient follow-up with Dr. Estanislado Pandy? 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 Abnormal right Resistive         Index. RRV flow present. No evidence of right renal artery         stenosis. Cyst(s) noted. Appearance of complex, non-vascular         cystic structure at inferior pole as well as simple appearing         cyst at superior pole. Increased echogenicity consistent with         chronic renal disease.  Left:  Abnormal size for the left kidney. Normal left Resistive         Index. LRV flow present. No evidence of left renal artery         stenosis. Cyst(s) noted. Increased echogenicity consistent         with chronic renal disease. Incidental: 0.553 cm echogenic         lesion with twinkle artifact.  Mesenteric:  70 to 99% stenosis in the celiac artery and superior mesenteric  artery.  IMPRESSIONS     1. Left ventricular ejection fraction, by estimation, is 65 to 70%. The  left ventricle has hyperdynamic function. The left ventricle has no  regional wall motion abnormalities. There is severe left ventricular  hypertrophy. Left ventricular diastolic  parameters are consistent with Grade I diastolic dysfunction (impaired  relaxation).   2. Right ventricular systolic function is normal. The right ventricular  size is normal. There is normal pulmonary artery systolic pressure. The  estimated right ventricular systolic pressure is [id number] mmHg.   3. Left atrial size was mildly dilated.   4. The mitral valve is normal in structure. No evidence of mitral valve  regurgitation. No evidence of mitral stenosis.   5. The aortic valve is tricuspid. Aortic valve regurgitation is trivial.  Mild aortic valve sclerosis is present, with no evidence of aortic valve  stenosis.  6. The inferior vena cava is normal in size with greater than 50%  respiratory variability, suggesting right atrial pressure of 3 mmHg.    Consultations: none  Discharge Exam: Vitals:   02/23/21 1425 02/23/21 1455  BP: 118/72 128/85  Pulse: 69   Resp: 16   Temp: 98.5 F (36.9 C)   SpO2: 100%    Seen with daughter at bedside Feels better no new complaints No HA, vision changes, chest pain Discussed f/u recommendations  General: No acute distress. Cardiovascular: Heart sounds show Tuana Hoheisel regular rate, and rhythm.  Lungs: Clear to auscultation bilaterally Abdomen: Soft, nontender, nondistended  Neurological: Alert and oriented 3. Moves all extremities 4 . Cranial nerves II through XII grossly intact. Skin: Warm and dry. No rashes or lesions. Extremities: No clubbing or cyanosis. No edema. Discharge Instructions   Discharge Instructions     Ambulatory referral to ENT   Complete by: As directed    Ambulatory referral to Vascular Surgery   Complete by: As directed    Call MD for:   difficulty breathing, headache or visual disturbances   Complete by: As directed    Call MD for:  difficulty breathing, headache or visual disturbances   Complete by: As directed    Call MD for:  extreme fatigue   Complete by: As directed    Call MD for:  extreme fatigue   Complete by: As directed    Call MD for:  hives   Complete by: As directed    Call MD for:  hives   Complete by: As directed    Call MD for:  persistant dizziness or light-headedness   Complete by: As directed    Call MD for:  persistant dizziness or light-headedness   Complete by: As directed    Call MD for:  persistant nausea and vomiting   Complete by: As directed    Call MD for:  persistant nausea and vomiting   Complete by: As directed    Call MD for:  redness, tenderness, or signs of infection (pain, swelling, redness, odor or green/yellow discharge around incision site)   Complete by: As directed    Call MD for:  redness, tenderness, or signs of infection (pain, swelling, redness, odor or green/yellow discharge around incision site)   Complete by: As directed    Call MD for:  severe uncontrolled pain   Complete by: As directed    Call MD for:  severe uncontrolled pain   Complete by: As directed    Call MD for:  temperature >100.4   Complete by: As directed    Call MD for:  temperature >100.4   Complete by: As directed    Diet - low sodium heart healthy   Complete by: As directed    Diet - low sodium heart healthy   Complete by: As directed    Discharge instructions   Complete by: As directed    You were seen for high blood pressures and headaches.   We've changed your blood pressure medicines Arshi Duarte little bit.  Stop verapamil.  Start amlodipine.  Start hydralazine 25 mg three times Tyshana Nishida day.  Increase your coreg to 12.5 mg twice daily.  Continue your imdur 60 mg daily.    Please follow up with your PCP and the kidney doctors for further adjustment of your medicines.    We checked for renal artery stenosis,  which can cause hypertension (high blood pressure).  Your ultrasound did not show renal artery stenosis.  It  did show 70-99% stenosis (narrowing) in the superior mesenteric artery and celiac artery.  We'll refer you to vascular as an outpatient.  Continue your aspirin daily.  Follow additional urine studies with your nephrologist outpatient.   You have Khaza Blansett pending lab.  Follow up your renin and aldosterone levels with your PCP or Paula [name].  Your headaches are improved.  Your MRI was stable.  Follow with your PCP outpatient.  Continue to follow your aneurysms with your outpatient providers.  Don't drive with your episode of fainting.  Follow up with your PCP for clearance.  Return for new, recurrent, or worsening symptoms.  Please ask your PCP to request records from this hospitalization so they know what was done and what the next steps will be.   Increase activity slowly   Complete by: As directed    Increase activity slowly   Complete by: As directed       Allergies as of 02/23/2021       Reactions   Dust Mite Extract Cough   Ace Inhibitors Cough   Lovastatin Other (See Comments)   Generalized body pain        Medication List     TAKE these medications    amLODipine 10 MG tablet Commonly known as: NORVASC Take 1 tablet (10 mg total) by mouth daily.   aspirin EC 81 MG tablet Take 81 mg by mouth every evening.   carvedilol 12.5 MG tablet Commonly known as: COREG Take 1 tablet (12.5 mg total) by mouth 2 (two) times daily with Rhea Kaelin meal. What changed:  medication strength how much to take   hydrALAZINE 25 MG tablet Commonly known as: APRESOLINE Take 1 tablet (25 mg total) by mouth every 8 (eight) hours.   isosorbide mononitrate 60 MG 24 hr tablet Commonly known as: IMDUR TAKE 1 TABLET (60 MG TOTAL) BY MOUTH DAILY. What changed: how much to take               Durable Medical Equipment  (From admission, onward)           Start     Ordered   02/23/21 1601   For home use only DME Tub bench  Once        02/23/21 1600   02/23/21 1600  For home use only DME 3 n 1  Once        02/23/21 1600   02/23/21 1559  For home use only DME Walker  Once       Question:  Patient needs Uldine Fuster walker to treat with the following condition  Answer:  Physical deconditioning   02/23/21 1558           Allergies  Allergen Reactions   Dust Mite Extract Cough   Ace Inhibitors Cough   Lovastatin Other (See Comments)    Generalized body pain      The results of significant diagnostics from this hospitalization (including imaging, microbiology, ancillary and laboratory) are listed below for reference.    Significant Diagnostic Studies: CT Head Wo Contrast  Result Date: 02/21/2021 CLINICAL DATA:  75 year old female with history of headache. Intracranial hemorrhage suspected. EXAM: CT HEAD WITHOUT CONTRAST TECHNIQUE: Contiguous axial images were obtained from the base of the skull through the vertex without intravenous contrast. COMPARISON:  Head CT 12/29/2020. FINDINGS: Brain: Patchy and confluent areas of decreased attenuation are noted throughout the deep and periventricular white matter of the cerebral hemispheres bilaterally, compatible with chronic microvascular ischemic disease. No evidence of acute  infarction, hemorrhage, hydrocephalus, extra-axial collection or mass lesion/mass effect. Vascular: No hyperdense vessel or unexpected calcification. Skull: Normal. Negative for fracture or focal lesion. Sinuses/Orbits: No acute finding. Other: None. IMPRESSION: 1. No acute intracranial abnormalities. 2. Chronic microvascular ischemic changes in the cerebral white matter, as above. Electronically Signed   By: Vinnie Langton M.D.   On: 02/21/2021 07:02   MR ANGIO HEAD WO CONTRAST  Result Date: 02/22/2021 CLINICAL DATA:  Cerebral aneurysm follow-up.  Headaches. EXAM: MRI HEAD WITHOUT CONTRAST MRA HEAD WITHOUT CONTRAST TECHNIQUE: Multiplanar, multi-echo pulse sequences of  the brain and surrounding structures were acquired without intravenous contrast. Angiographic images of the Circle of Willis were acquired using MRA technique without intravenous contrast. COMPARISON:  Dec 31, 2020. FINDINGS: MRI HEAD FINDINGS Brain: No acute infarction, hemorrhage, hydrocephalus, extra-axial collection or mass lesion. Mild to moderate for age patchy white matter T2/FLAIR hyperintensity, most likely related to chronic microvascular ischemic disease. Mild to moderate for age atrophy with ex vacuo ventricular dilation. Vascular: Major arterial flow voids are maintained skull base. Skull and upper cervical spine: Normal marrow signal. Sinuses/Orbits: Mild paranasal sinus mucosal thickening. No acute orbital findings. Other: No mastoid effusions. MRA HEAD FINDINGS Motion limited evaluation.  Within this limitation: Anterior circulation: Similar appearance of an approximately 3 mm medially projecting aneurysm arising from the proximal left cavernous ICA (series 5, image 75). Also, similar approximately 3 mm inferiorly projecting aneurysm arising from the supraclinoid left ICA (series 5, image 91; series 1049, image 194). Suspected additional approximately 2-3 mm anterosuperiorly directed is aneurysm from the supraclinoid left ICA (series 5, image 99; series 1049, image 164) which appears similar to prior. Mild bilateral ICA atherosclerotic narrowing. Similar ectatic left A1 ACA with hypoplastic right A1 ACA. Bilateral ICAs, MCAs, and ACAs are patent. Posterior circulation: Bilateral intradural vertebral arteries, basilar artery and posterior. Cerebral arteries are patent. 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 Otherwise, multifocal mild stenosis of these vessels without evidence of Kila Godina high-grade stenosis. Posterior communicating arteries are hypoplastic or absent bilaterally. IMPRESSION: MRI: 1. No evidence of acute intracranial abnormality. 2. Mild-to-moderate for age chronic  microvascular ischemic disease and atrophy. MRA: 1. Motion limited evaluation with similar appearance of 3 mm medially directed aneurysm arising from the proximal left cavernous ICA and 3 mm inferiorly projecting aneurysm arising from the supraclinoid left ICA. 2. Suspected additional 2-3 mm anterosuperiorly directed aneurysm arising from the supraclinoid left ICA, which also appears similar. 3. Similar ectatic left A1 ACA. 4. Similar atherosclerotic disease with moderate basilar artery stenosis an additional multifocal mild stenosis. Mild for age chronic microvascular ischemic disease and atrophy. Electronically Signed   By: Margaretha Sheffield MD   On: 02/22/2021 13:25   MR BRAIN WO CONTRAST  Result Date: 02/22/2021 CLINICAL DATA:  Cerebral aneurysm follow-up.  Headaches. EXAM: MRI HEAD WITHOUT CONTRAST MRA HEAD WITHOUT CONTRAST TECHNIQUE: Multiplanar, multi-echo pulse sequences of the brain and surrounding structures were acquired without intravenous contrast. Angiographic images of the Circle of Willis were acquired using MRA technique without intravenous contrast. COMPARISON:  Dec 31, 2020. FINDINGS: MRI HEAD FINDINGS Brain: No acute infarction, hemorrhage, hydrocephalus, extra-axial collection or mass lesion. Mild to moderate for age patchy white matter T2/FLAIR hyperintensity, most likely related to chronic microvascular ischemic disease. Mild to moderate for age atrophy with ex vacuo ventricular dilation. Vascular: Major arterial flow voids are maintained skull base. Skull and upper cervical spine: Normal marrow signal. Sinuses/Orbits: Mild paranasal sinus mucosal thickening. No acute orbital findings. Other:  No mastoid effusions. MRA HEAD FINDINGS Motion limited evaluation.  Within this limitation: Anterior circulation: Similar appearance of an approximately 3 mm medially projecting aneurysm arising from the proximal left cavernous ICA (series 5, image 75). Also, similar approximately 3 mm inferiorly  projecting aneurysm arising from the supraclinoid left ICA (series 5, image 91; series 1049, image 194). Suspected additional approximately 2-3 mm anterosuperiorly directed is aneurysm from the supraclinoid left ICA (series 5, image 99; series 1049, image 164) which appears similar to prior. Mild bilateral ICA atherosclerotic narrowing. Similar ectatic left A1 ACA with hypoplastic right A1 ACA. Bilateral ICAs, MCAs, and ACAs are patent. Posterior circulation: Bilateral intradural vertebral arteries, basilar artery and posterior. Cerebral arteries are patent. 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 Otherwise, multifocal mild stenosis of these vessels without evidence of Magdiel Bartles high-grade stenosis. Posterior communicating arteries are hypoplastic or absent bilaterally. IMPRESSION: MRI: 1. No evidence of acute intracranial abnormality. 2. Mild-to-moderate for age chronic microvascular ischemic disease and atrophy. MRA: 1. Motion limited evaluation with similar appearance of 3 mm medially directed aneurysm arising from the proximal left cavernous ICA and 3 mm inferiorly projecting aneurysm arising from the supraclinoid left ICA. 2. Suspected additional 2-3 mm anterosuperiorly directed aneurysm arising from the supraclinoid left ICA, which also appears similar. 3. Similar ectatic left A1 ACA. 4. Similar atherosclerotic disease with moderate basilar artery stenosis an additional multifocal mild stenosis. Mild for age chronic microvascular ischemic disease and atrophy. Electronically Signed   By: Margaretha Sheffield MD   On: 02/22/2021 13:25   US RENAL  Result Date: 02/21/2021 CLINICAL DATA:  AK I EXAM: RENAL / URINARY TRACT ULTRASOUND COMPLETE COMPARISON:  June 18, 2015. FINDINGS: Right Kidney: Renal measurements: 6.8 x 3.8 x 3.7 cm = volume: 50 mL. Increased echogenicity. No mass or hydronephrosis visualized. Left Kidney: Renal measurements: 8.3.8 x 3.4 cm = volume: 69 mL. Increased cortical  echogenicity. No mass or hydronephrosis visualized. Bladder: Decompressed Other: None. IMPRESSION: 1. No acute abnormality. No evidence of obstructive uropathy. 2. Bilateral renal atrophy with increased cortical echogenicity as can be seen in medical renal disease. Electronically Signed   By: Dahlia Bailiff MD   On: 02/21/2021 23:29   DG Chest Port 1 View  Result Date: 02/21/2021 CLINICAL DATA:  75 year old female with cough, hypertension, shortness of breath. EXAM: PORTABLE CHEST 1 VIEW COMPARISON:  Chest radiographs 09/20/2018 and earlier. FINDINGS: Portable AP upright view at 0616 hours. Lung volumes and mediastinal contours remain within normal limits; mild tortuosity of the descending aorta. Visualized tracheal air column is within normal limits. Allowing for portable technique the lungs are clear. No pneumothorax or pleural effusion. No acute osseous abnormality identified. IMPRESSION: Negative portable chest. Electronically Signed   By: Genevie Ann M.D.   On: 02/21/2021 06:26   VAS US RENAL ARTERY DUPLEX  Result Date: 02/23/2021 ABDOMINAL VISCERAL Patient Name:  DEAVEON DOWNEY  Date of Exam:   02/23/2021 Medical Rec #: [id number]            Accession #:    [id number] Date of Birth: 03/04/1946            Patient Gender: F Patient Age:   34Y Exam Location:  [facility] Procedure:      VAS US RENAL ARTERY DUPLEX Referring Phys: [id number] Eartha Vonbehren CALDWELL POWELL JR -------------------------------------------------------------------------------- Indications: Hypertension Other Factors: Chronic renal disease. Limitations: Air/bowel gas. Comparison Study: 06-18-2015 US Renal showed two left renal cysts measuring up  to 1.4 cm. Performing Technologist: Darlin Coco RDMS,RVT  Examination Guidelines: Kallan Merrick complete evaluation includes B-mode imaging, spectral Doppler, color Doppler, and power Doppler as needed of all accessible portions of each vessel. Bilateral testing is considered an integral part  of Aveah Castell complete examination. Limited examinations for reoccurring indications may be performed as noted.  Duplex Findings: +--------------------+--------+--------+------+--------+ Mesenteric          PSV cm/sEDV cm/sPlaqueComments +--------------------+--------+--------+------+--------+ Aorta Mid             101                          +--------------------+--------+--------+------+--------+ Celiac Artery Origin  259                          +--------------------+--------+--------+------+--------+ SMA Origin            362                          +--------------------+--------+--------+------+--------+    +------------------+--------+--------+-------+ Right Renal ArteryPSV cm/sEDV cm/sComment +------------------+--------+--------+-------+ Origin               61      10           +------------------+--------+--------+-------+ Proximal             58      7            +------------------+--------+--------+-------+ Mid                  44      10           +------------------+--------+--------+-------+ Distal               48      7            +------------------+--------+--------+-------+ +-----------------+--------+--------+-------+ Left Renal ArteryPSV cm/sEDV cm/sComment +-----------------+--------+--------+-------+ Origin              46      10           +-----------------+--------+--------+-------+ Proximal            47      8            +-----------------+--------+--------+-------+ Mid                 57      12           +-----------------+--------+--------+-------+ Distal              28      8            +-----------------+--------+--------+-------+ +------------+--------+--------+----+-----------+--------+--------+----+ Right KidneyPSV cm/sEDV cm/sRI  Left KidneyPSV cm/sEDV cm/sRI   +------------+--------+--------+----+-----------+--------+--------+----+ Upper Pole  14      4       0.73Upper Pole 14      4        0.88 +------------+--------+--------+----+-----------+--------+--------+----+ Mid         19      4       0.77Mid        13      4       0.69 +------------+--------+--------+----+-----------+--------+--------+----+ Lower Pole  16      5       0.72Lower Pole 14      4       0.71 +------------+--------+--------+----+-----------+--------+--------+----+ Hilar  17      4       0.77Hilar      -19     -8      0.61 +------------+--------+--------+----+-----------+--------+--------+----+ +------------------+----+------------------+----+ Right Kidney          Left Kidney            +------------------+----+------------------+----+ RAR                   RAR                    +------------------+----+------------------+----+ RAR (manual)      0.60RAR (manual)      0.56 +------------------+----+------------------+----+ Cortex            0.69Cortex                 +------------------+----+------------------+----+ Cortex thickness      Corex thickness        +------------------+----+------------------+----+ Kidney length (cm)8.55Kidney length (cm)8.55 +------------------+----+------------------+----+  Summary: Renal:  Right: Abnormal size for the right kidney. Abnormal right Resistive        Index. RRV flow present. No evidence of right renal artery        stenosis. Cyst(s) noted. Appearance of complex, non-vascular        cystic structure at inferior pole as well as simple appearing        cyst at superior pole. Increased echogenicity consistent with        chronic renal disease. Left:  Abnormal size for the left kidney. Normal left Resistive        Index. LRV flow present. No evidence of left renal artery        stenosis. Cyst(s) noted. Increased echogenicity consistent        with chronic renal disease. Incidental: 0.553 cm echogenic        lesion with twinkle artifact. 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  *See  table(s) above for measurements and observations.  Diagnosing physician: Monica Martinez MD  Electronically signed by Monica Martinez MD on 02/23/2021 at 2:04:28 PM.    Final    ECHOCARDIOGRAM LIMITED  Result Date: 02/22/2021    ECHOCARDIOGRAM LIMITED REPORT   Patient Name:   [name] Date of Exam: 02/22/2021 Medical Rec #:  [id number]           Height:       62.0 in Accession #:    [id number]          Weight:       135.4 lb Date of Birth:  02/19/1946           BSA:          1.619 m Patient Age:    82 years            BP:           170/81 mmHg Patient Gender: F                   HR:           76 bpm. Exam Location:  Inpatient Procedure: Limited Echo, Limited Color Doppler and Cardiac Doppler Indications:    Syncope  History:        Patient has prior history of Echocardiogram examinations, most                 recent 09/02/2017. CHF; Risk Factors:Diabetes, Dyslipidemia and  Hypertension.  Sonographer:    [place] Referring Phys: [id number] [place]  1. Left ventricular ejection fraction, by estimation, is 65 to 70%. The left ventricle has hyperdynamic function. The left ventricle has no regional wall motion abnormalities. There is severe left ventricular hypertrophy. Left ventricular diastolic parameters are consistent with Grade I diastolic dysfunction (impaired relaxation).  2. Right ventricular systolic function is normal. The right ventricular size is normal. There is normal pulmonary artery systolic pressure. The estimated right ventricular systolic pressure is Q000111Q mmHg.  3. Left atrial size was mildly dilated.  4. The mitral valve is normal in structure. No evidence of mitral valve regurgitation. No evidence of mitral stenosis.  5. The aortic valve is tricuspid. Aortic valve regurgitation is trivial. Mild aortic valve sclerosis is present, with no evidence of aortic valve stenosis.  6. The inferior vena cava is normal in size with greater than 50%  respiratory variability, suggesting right atrial pressure of 3 mmHg. FINDINGS  Left Ventricle: Left ventricular ejection fraction, by estimation, is 65 to 70%. The left ventricle has hyperdynamic function. The left ventricle has no regional wall motion abnormalities. The left ventricular internal cavity size was normal in size. There is severe left ventricular hypertrophy. Left ventricular diastolic parameters are consistent with Grade I diastolic dysfunction (impaired relaxation). Right Ventricle: The right ventricular size is normal. No increase in right ventricular wall thickness. Right ventricular systolic function is normal. There is normal pulmonary artery systolic pressure. The tricuspid regurgitant velocity is 2.21 m/s, and  with an assumed right atrial pressure of 3 mmHg, the estimated right ventricular systolic pressure is Q000111Q mmHg. Left Atrium: Left atrial size was mildly dilated. Right Atrium: Right atrial size was normal in size. Pericardium: Trivial pericardial effusion is present. Mitral Valve: The mitral valve is normal in structure. No evidence of mitral valve stenosis. Tricuspid Valve: The tricuspid valve is normal in structure. Tricuspid valve regurgitation is trivial. Aortic Valve: The aortic valve is tricuspid. Aortic valve regurgitation is trivial. Aortic regurgitation PHT measures 481 msec. Mild aortic valve sclerosis is present, with no evidence of aortic valve stenosis. Aortic valve mean gradient measures 5.5 mmHg. Aortic valve peak gradient measures 9.6 mmHg. Pulmonic Valve: The pulmonic valve was normal in structure. Pulmonic valve regurgitation is not visualized. Aorta: The aortic root is normal in size and structure. Venous: The inferior vena cava is normal in size with greater than 50% respiratory variability, suggesting right atrial pressure of 3 mmHg. LEFT VENTRICLE PLAX 2D LVIDd:         3.00 cm LVIDs:         1.10 cm LV PW:         1.10 cm LV IVS:        1.60 cm LVOT diam:     2.10  cm LVOT Area:     3.46 cm  LV Volumes (MOD) LV vol d, MOD A2C: 25.0 ml LV vol d, MOD A4C: 38.2 ml LV vol s, MOD A2C: 5.7 ml LV vol s, MOD A4C: 12.8 ml LV SV MOD A2C:     19.3 ml LV SV MOD A4C:     38.2 ml LV SV MOD BP:      23.0 ml RIGHT VENTRICLE RV S prime:     13.20 cm/s TAPSE (M-mode): 1.8 cm LEFT ATRIUM             Index LA Vol (A2C):   25.9 ml 15.99 ml/m LA Vol (A4C):   54.3  ml 33.53 ml/m LA Biplane Vol: 37.6 ml 23.22 ml/m  AORTIC VALVE               PULMONIC VALVE AV Vmax:      155.00 cm/s  PV Vmax:       1.25 m/s AV Vmean:     111.500 cm/s PV Vmean:      90.200 cm/s AV VTI:       0.356 m      PV VTI:        0.224 m AV Peak Grad: 9.6 mmHg     PV Peak grad:  6.2 mmHg AV Mean Grad: 5.5 mmHg     PV Mean grad:  4.0 mmHg AI PHT:       481 msec  AORTA Ao Root diam: 3.10 cm Ao Asc diam:  3.30 cm MITRAL VALVE                TRICUSPID VALVE MV Area (PHT): 2.62 cm     TR Peak grad:   19.5 mmHg MV Decel Time: 290 msec     TR Vmax:        221.00 cm/s MV E velocity: 68.80 cm/s MV Zenda Herskowitz velocity: 108.00 cm/s  SHUNTS MV E/Wyolene Weimann ratio:  0.64         Systemic Diam: 2.10 cm Loralie Champagne MD Electronically signed by Loralie Champagne MD Signature Date/Time: 02/22/2021/11:51:12 AM    Final    CT MAXILLOFACIAL WO CONTRAST  Result Date: 02/21/2021 CLINICAL DATA:  Facial fracture.  Left jaw pain and congestion. EXAM: CT MAXILLOFACIAL WITHOUT CONTRAST TECHNIQUE: Multidetector CT imaging of the maxillofacial structures was performed. Multiplanar CT image reconstructions were also generated. COMPARISON:  07/21/2018 FINDINGS: Osseous: No fracture or mandibular dislocation. No destructive process. Orbits: Negative. No traumatic or inflammatory finding. Sinuses: Clear. Soft tissues: Negative. Limited intracranial: No significant or unexpected finding. IMPRESSION: Stable since 07/21/2018.  No acute bony abnormality. Paranasal sinuses and mastoid air cells are clear bilaterally. No evidence for acute or chronic sinusitis. Electronically Signed    By: Misty Stanley M.D.   On: 02/21/2021 18:53    Microbiology: Recent Results (from the past 240 hour(s))  Resp Panel by RT-PCR (Flu Kasyn Rolph&B, Covid) Nasopharyngeal Swab     Status: None   Collection Time: 02/21/21  6:24 AM   Specimen: Nasopharyngeal Swab; Nasopharyngeal(NP) swabs in vial transport medium  Result Value Ref Range Status   SARS Coronavirus 2 by RT PCR NEGATIVE NEGATIVE Final    Comment: (NOTE) SARS-CoV-2 target nucleic acids are NOT DETECTED.  The SARS-CoV-2 RNA is generally detectable in upper respiratory specimens during the acute phase of infection. The lowest concentration of SARS-CoV-2 viral copies this assay can detect is 138 copies/mL. Jorma Tassinari negative result does not preclude SARS-Cov-2 infection and should not be used as the sole basis for treatment or other patient management decisions. Connery Shiffler negative result may occur with  improper specimen collection/handling, submission of specimen other than nasopharyngeal swab, presence of viral mutation(s) within the areas targeted by this assay, and inadequate number of viral copies(<138 copies/mL). Pyper Olexa negative result must be combined with clinical observations, patient history, and epidemiological information. The expected result is Negative.  Fact Sheet for Patients:  EntrepreneurPulse.com.au  Fact Sheet for Healthcare Providers:  IncredibleEmployment.be  This test is no t yet approved or cleared by the [place] FDA and  has been authorized for detection and/or diagnosis of SARS-CoV-2 by FDA under an Emergency Use Authorization (EUA). This EUA will remain  in effect (meaning this test can be used) for the duration of the COVID-19 declaration under Section 564(b)(1) of the Act, 21 U.S.C.section 360bbb-3(b)(1), unless the authorization is terminated  or revoked sooner.       Influenza Jeana Kersting by PCR NEGATIVE NEGATIVE Final   Influenza B by PCR NEGATIVE NEGATIVE Final    Comment:  (NOTE) The Xpert Xpress SARS-CoV-2/FLU/RSV plus assay is intended as an aid in the diagnosis of influenza from Nasopharyngeal swab specimens and should not be used as Axyl Sitzman sole basis for treatment. Nasal washings and aspirates are unacceptable for Xpert Xpress SARS-CoV-2/FLU/RSV testing.  Fact Sheet for Patients: EntrepreneurPulse.com.au  Fact Sheet for Healthcare Providers: IncredibleEmployment.be  This test is not yet approved or cleared by the [place] FDA and has been authorized for detection and/or diagnosis of SARS-CoV-2 by FDA under an Emergency Use Authorization (EUA). This EUA will remain in effect (meaning this test can be used) for the duration of the COVID-19 declaration under Section 564(b)(1) of the Act, 21 U.S.C. section 360bbb-3(b)(1), unless the authorization is terminated or revoked.  Performed at [facility], [place] [address], [place], [place] [address]      Labs: Basic Metabolic Panel: Recent Labs  Lab 02/21/21 0332 02/21/21 0538 02/22/21 0205 02/23/21 0151  NA 130* 129* 134* 133*  K 4.5 3.6 4.0 3.6  CL 91* 92* 101 99  CO2 29  --  27 23  GLUCOSE 115* 141* 157* 111*  BUN 50* 46* 47* 48*  CREATININE 3.90* 4.00* 3.56* 3.58*  CALCIUM 9.4  --  8.6* 8.7*  MG  --   --   --  2.0  PHOS  --   --  4.9* 4.8*   Liver Function Tests: Recent Labs  Lab 02/22/21 0205 02/23/21 0151  AST  --  16  ALT  --  14  ALKPHOS  --  59  BILITOT  --  0.6  PROT  --  6.0*  ALBUMIN 3.0* 3.1*   No results for input(s): LIPASE, AMYLASE in the last 168 hours. No results for input(s): AMMONIA in the last 168 hours. CBC: Recent Labs  Lab 02/21/21 0332 02/21/21 0538 02/22/21 0205 02/23/21 0151  WBC 6.1  --  6.2 5.9  NEUTROABS 3.6  --   --  3.6  HGB 10.5* 10.9* 10.3* 9.9*  HCT 29.7* 32.0* 28.1* 26.8*  MCV 83.7  --  83.1 82.2  PLT 209  --  197 184   Cardiac Enzymes: No results for input(s): CKTOTAL, CKMB, CKMBINDEX,  TROPONINI in the last 168 hours. BNP: BNP (last 3 results) No results for input(s): BNP in the last 8760 hours.  ProBNP (last 3 results) No results for input(s): PROBNP in the last 8760 hours.  CBG: Recent Labs  Lab 02/22/21 1127 02/22/21 1600 02/22/21 2121 02/23/21 0755 02/23/21 1159  GLUCAP 160* 172* 148* 113* 168*       Signed:  Fayrene Helper MD.  [facility] 02/23/2021, 4:14 PM

## 2021-02-23 NOTE — TOC Transition Note (Signed)
Transition of Care [facility]) - CM/SW Discharge Note   Patient Details  Name: Paula [name] MRN: [id number] Date of Birth: 1946/02/15  [facility] CM/SW Contact:  Konrad Penta, RN Phone Number: [phone] 02/23/2021, 5:33 PM   Clinical Narrative:   Spoke with patient. Lives alone. Supportive daughter. Discussed home health arrangements. [facility] for PT.   Georgina Snell with [facility] contacted and is able to accept referral.   DME delivered to room.    Final next level of care: [place] Barriers to Discharge: No Barriers Identified   Patient Goals and CMS Choice Patient states their goals for this hospitalization and ongoing recovery are:: return home CMS Medicare.gov Compare Post Acute Care list provided to:: Patient Choice offered to / list presented to : Patient  Discharge Placement                       Discharge Plan and Services                DME Arranged: 3-N-1, Walker rolling, Tub bench DME Agency: [employer] Date DME Agency Contacted: 02/23/21 Time DME Agency Contacted: [id number] Representative spoke with at DME Agency: [place]: PT [place]: [place] Date [place]: 02/23/21   Representative spoke with at [place]: [place] ([place]) Interventions     Readmission Risk Interventions No flowsheet data found.

## 2021-02-23 NOTE — Progress Notes (Signed)
Renal artery duplex study completed.   Please see CV Proc for preliminary results.   Eulogia Dismore, RDMS, RVT  

## 2021-02-25 ENCOUNTER — Telehealth: Payer: Self-pay

## 2021-02-25 ENCOUNTER — Other Ambulatory Visit: Payer: Self-pay

## 2021-02-25 NOTE — Telephone Encounter (Signed)
From the discharge call. Appointment with Dr Margarita Rana 03/05/2021.    She said she is feeling a little better. She lives alone but said that her daughter checks on her.   She said that she has all medications including the ones.  She has appointment for medication management with Benard Halsted, [facility] at [facility] tomorrow - 02/26/2021 @ 1030. Instructed her to bring all of her meds with her to the appointment. This CM spoke to [employer] and arranged ride for tomorrow to [facility].    Home Health was ordered at discharge. This CM  called Adela Lank [facility] Liaison @ [address].  He said that the patient does not have Medicare Part A which covers home health.  He stated he will call the patient and discuss outpatient therapy which is covered under Medicare Part B.

## 2021-02-25 NOTE — Telephone Encounter (Signed)
Transition Care Management Follow-up Telephone Call Date of discharge and from where: 02/23/2021-[facility] ED  How have you been since you were released from the hospital? Have some improvement.  Any questions or concerns? No  Items Reviewed: Did the pt receive and understand the discharge instructions provided? Yes  Medications obtained and verified? Yes  Other? No  Any new allergies since your discharge? No  Dietary orders reviewed? N/A Do you have support at home? Yes   Home Care and Equipment/Supplies: Were home health services ordered? yes If so, what is the name of the agency? [facility]   Has the agency set up a time to come to the patient's home? no Were any new equipment or medical supplies ordered?  No What is the name of the medical supply agency? N/A Were you able to get the supplies/equipment? not applicable Do you have any questions related to the use of the equipment or supplies? No  Functional Questionnaire: (I = Independent and D = Dependent) ADLs: D Bathing/Dressing- I  Meal Prep- D  Eating- I  Maintaining continence- I  Transferring/Ambulation- D  Managing Meds- D  Follow up appointments reviewed:  PCP Hospital f/u appt confirmed? No   Specialist Hospital f/u appt confirmed? No   Are transportation arrangements needed? No  If their condition worsens, is the pt aware to call PCP or go to the Emergency Dept.? Yes Was the patient provided with contact information for the PCP's office or ED? Yes Was to pt encouraged to call back with questions or concerns? Yes

## 2021-02-25 NOTE — Telephone Encounter (Signed)
Transition Care Management Follow-up Telephone Call Date of discharge and from where: 02/23/2021, [facility] How have you been since you were released from the hospital? She said she is feeling a little better Any questions or concerns? No  Items Reviewed: Did the pt receive and understand the discharge instructions provided? Yes  Medications obtained and verified? Yes  - she said that she has all medications including the ones.  She has appointment for medication management with Benard Halsted, [facility] at [facility] tomorrow - 02/26/2021 @ 1030. Instructed her to bring all of her meds with her to the appointment. This CM spoke to [employer] and arranged ride for tomorrow to [facility].   Other? No  Any new allergies since your discharge? No  Do you have support at home?  She lives alone but said that her daughter checks on her  [place] and Equipment/Supplies: Were home health services ordered? yes If so, what is the name of the agency? [facility]   Has the agency set up a time to come to the patient's home? No This CM  called Adela Lank [facility] Liaison @ [address].  He said that the patient does not have Medicare Part A which covers home health.  He stated he will call the patient and discuss outpatient therapy which is covered under Medicare Part B.  Were any new equipment or medical supplies ordered?  Yes: walker What is the name of the medical supply agency? [facility] Were you able to get the supplies/equipment? yes Do you have any questions related to the use of the equipment or supplies? No  Functional Questionnaire: (I = Independent and D = Dependent) ADLs: independent but said that sometimes she needs help.    Follow up appointments reviewed:  PCP Hospital f/u appt confirmed? Yes  Scheduled to see Dr Margarita Rana  on 03/05/2021 @ 1050. [place] [facility] f/u appt confirmed? Yes  Scheduled to see - [facility] - 03/01/2021.; pulmonary - 03/15/2021.   Are transportation arrangements  needed? She said she usually rides [place] and she has used [employer] in the past.  If their condition worsens, is the pt aware to call PCP or go to the Emergency Dept.? Yes Was the patient provided with contact information for the PCP's office or ED? Yes Was to pt encouraged to call back with questions or concerns? Yes

## 2021-02-26 ENCOUNTER — Other Ambulatory Visit: Payer: Self-pay

## 2021-02-26 ENCOUNTER — Encounter: Payer: Self-pay | Admitting: Pharmacist

## 2021-02-26 ENCOUNTER — Ambulatory Visit: Payer: Medicare Other | Attending: Family Medicine | Admitting: Pharmacist

## 2021-02-26 VITALS — BP 139/69 | HR 65

## 2021-02-26 DIAGNOSIS — Z79899 Other long term (current) drug therapy: Secondary | ICD-10-CM

## 2021-02-26 NOTE — Progress Notes (Signed)
    S:    PCP: Dr. Margarita Rana   Patient arrives in good spirits. Presents to the clinic for medication reconciliation.  Patient was referred by Primary Care Provider on 02/05/2021.   Medication adherence reported. She brings me her medications today. She has all medications and voices understanding of how to take each one. She also knows the indications for each medication.  Current Medications include:  amlodipine 10 mg daily, ASA 81 EC daily, carvedilol 12.5 mg BID, hydralazine 25 mg TID, isosorbide mononitrate 60 mg daily  Of note, she brings me a bottle of rosuvastatin and this is not on her list. I cannot find a d/c reason in her chart. Additionally, she has a bottle of calcitriol that is written under by Dr. [name] at [facility]. Neither of these medications are on her list.   O:  Vitals:   02/26/21 1108  BP: 139/69  Pulse: 65   Last 3 Office BP readings: BP Readings from Last 3 Encounters:  02/26/21 139/69  02/23/21 128/85  01/04/21 130/73   Medication Adherence Questionnaire (A score of 2 or more points indicates risk for nonadherence)  Do you know what each of your medicines is for? 0 (1 point if no)  Do you ever have trouble remembering to take your medicine? 0 (2 points if yes)  Do you ever not take a medicine because you feel you do not need it?  0 (1 point if yes)  Do you think that any of your medicines is not helping you? 0 (1 point if yes)  Do you have any physical problems such as vision loss that keep you from taking your medicines as prescribed?  0 (2 points if yes)  Do you think any of your medicine is causing a side effect?  0 (1 point if yes)  Do you know the names of ALL of your medicines? 0 (1 point if no)  Do you think that you need ALL of your medicines? 0 (1 point if no)  In the past 6 months, have you missed getting a refill or a new prescription filled on time?  0 (1 point if yes)  How often do you miss taking a dose of medicine?  0 Never (0 points), 1  or 2 times a month (0 points), 1 time a week (2 points), 2 or more times a week (2 points).   TOTAL SCORE 0   A/P: Patient has no known adherence challenges. She has all of her medications with her. She understands what they are used for and how to take them. Of note, I instructed her to call and follow-up with her nephrologist to determine if she should be taking calcitriol. I have told her to hold her rosuvastatin until she talks to Dr. Margarita Rana next week. She verbalizes understanding.  -No medication changes. -Counseled on lifestyle modifications for blood pressure control including reduced dietary sodium, increased exercise, adequate sleep.  Results reviewed and written information provided.   Total time in face-to-face counseling 30 minutes.   F/U Clinic Visit next week with her PCP.    Benard Halsted, PharmD, [place], [place] [phone]

## 2021-03-01 ENCOUNTER — Ambulatory Visit (INDEPENDENT_AMBULATORY_CARE_PROVIDER_SITE_OTHER): Payer: Medicare Other | Admitting: Vascular Surgery

## 2021-03-01 ENCOUNTER — Ambulatory Visit (HOSPITAL_COMMUNITY)
Admission: RE | Admit: 2021-03-01 | Discharge: 2021-03-01 | Disposition: A | Discharge status: Home / Self Care | Payer: Medicare Other | Source: Ambulatory Visit | Attending: Vascular Surgery | Admitting: Vascular Surgery

## 2021-03-01 ENCOUNTER — Encounter: Payer: Self-pay | Admitting: Vascular Surgery

## 2021-03-01 ENCOUNTER — Other Ambulatory Visit: Payer: Self-pay

## 2021-03-01 ENCOUNTER — Ambulatory Visit (INDEPENDENT_AMBULATORY_CARE_PROVIDER_SITE_OTHER)
Admission: RE | Admit: 2021-03-01 | Discharge: 2021-03-01 | Disposition: A | Discharge status: Home / Self Care | Payer: Medicare Other | Source: Ambulatory Visit | Attending: Vascular Surgery | Admitting: Vascular Surgery

## 2021-03-01 VITALS — BP 155/72 | HR 62 | Temp 97.9°F | Resp 20 | Ht 62.0 in | Wt 136.0 lb

## 2021-03-01 DIAGNOSIS — N183 Chronic kidney disease, stage 3 unspecified: Secondary | ICD-10-CM

## 2021-03-01 LAB — ALDOSTERONE + RENIN ACTIVITY W/ RATIO
ALDO / PRA Ratio: 10.1 (ref 0.0–30.0)
Aldosterone: 2.3 ng/dL (ref 0.0–30.0)
PRA LC/MS/MS: 0.228 ng/mL/hr (ref 0.167–5.380)

## 2021-03-01 NOTE — Progress Notes (Signed)
Patient ID: Paula [name], female   DOB: Sep 04, 1945, 75 y.o.   MRN: [id number]  Reason for Consult: New Patient (Initial Visit)   Referred by Charlott Rakes, MD  Subjective:     HPI:  Paula [name] is a 75 y.o. female originally from [name] now with chronic kidney disease sent for evaluation of permanent dialysis access.  She has never been on dialysis before.  She does not have any family history of patients on dialysis.  She has been evaluated by [facility] she is not a candidate for transplant.  We have been asked to place a fistula.She states that she has had small veins for a long time.  She does not have any previous chest or breast surgery but did have bilateral lymphadenopathy when she was hospitalized in [place] many years ago.  She has no upper extremity swelling.  She is right-hand dominant.  Past Medical History:  Diagnosis Date   Allergic rhinitis    Arthritis    Asthma    Brain aneurysm    Chronic kidney disease    Stage 3   Cough    Diabetes mellitus    Diastolic CHF, chronic ([place]) 10/11/2011   GERD (gastroesophageal reflux disease)    History of colon polyps 2012   tubular adenoma    Hyperlipidemia    Hypertension    Neuropathy 10/11/2011   PONV (postoperative nausea and vomiting)    one time after lymph node surgery   Family History  Problem Relation Age of Onset   Hypertension Mother    Asthma Sister    Colon cancer Neg Hx    Allergic rhinitis Neg Hx    Eczema Neg Hx    Past Surgical History:  Procedure Laterality Date   CATARACT EXTRACTION     right eye   IR 3D INDEPENDENT [facility]  12/14/2017   IR ANGIO INTRA EXTRACRAN SEL COM CAROTID INNOMINATE BILAT MOD SED  09/15/2017   IR ANGIO INTRA EXTRACRAN SEL COM CAROTID INNOMINATE UNI L MOD SED  12/14/2017   IR ANGIO VERTEBRAL SEL VERTEBRAL BILAT MOD SED  09/15/2017   IR RADIOLOGIST EVAL & MGMT  09/10/2017   IR RADIOLOGIST EVAL & MGMT  10/19/2017   lymphatic mass surgery     lymphoma mass surgery  Bilateral    non cancerous   NASAL TURBINATE REDUCTION     RADIOLOGY WITH ANESTHESIA N/A 12/14/2017   Procedure: RADIOLOGY WITH ANESTHESIA EMBOLIZATION;  Surgeon: Luanne Bras, MD;  Location: [facility];  Service: Radiology;  Laterality: N/A;   VIDEO BRONCHOSCOPY Bilateral 11/18/2017   Procedure: VIDEO BRONCHOSCOPY WITHOUT FLUORO;  Surgeon: Collene Gobble, MD;  Location: [facility];  Service: Cardiopulmonary;  Laterality: Bilateral;    Short Social History:  Social History   Tobacco Use   Smoking status: Never   Smokeless tobacco: Never  Substance Use Topics   Alcohol use: No    Allergies  Allergen Reactions   Dust Mite Extract Cough   Ace Inhibitors Cough   Lovastatin Other (See Comments)    Generalized body pain    Current Outpatient Medications  Medication Sig Dispense Refill   amLODipine (NORVASC) 10 MG tablet Take 1 tablet (10 mg total) by mouth daily. 30 tablet 0   aspirin EC 81 MG tablet Take 81 mg by mouth every evening.     carvedilol (COREG) 12.5 MG tablet Take 1 tablet (12.5 mg total) by mouth 2 (two) times daily with a meal. 60 tablet 0  hydrALAZINE (APRESOLINE) 25 MG tablet Take 1 tablet (25 mg total) by mouth every 8 (eight) hours. 90 tablet 0   isosorbide mononitrate (IMDUR) 60 MG 24 hr tablet TAKE 1 TABLET (60 MG TOTAL) BY MOUTH DAILY. 90 tablet 0   No current facility-administered medications for this visit.    Review of Systems  Constitutional:  Constitutional negative. HENT: HENT negative.  Eyes: Eyes negative.  Respiratory: Respiratory negative.  Cardiovascular: Cardiovascular negative.  GI: Gastrointestinal negative.  Musculoskeletal: Musculoskeletal negative.  Skin: Skin negative.  Neurological: Neurological negative. Hematologic: Hematologic/lymphatic negative.  Psychiatric: Psychiatric negative.       Objective:  Objective   Vitals:   03/01/21 1034  BP: (!) 155/72  Pulse: 62  Resp: 20  Temp: 97.9 F (36.6 C)  SpO2: 98%  Weight:  136 lb (61.7 kg)  Height: '5\' 2"'$  (1.575 m)   Body mass index is 24.87 kg/m.  Physical Exam HENT:     Head: Normocephalic.     Nose:     Comments: Wearing a mask Eyes:     Pupils: Pupils are equal, round, and reactive to light.  Cardiovascular:     Rate and Rhythm: Normal rate.     Pulses: Normal pulses.  Pulmonary:     Effort: Pulmonary effort is normal.  Abdominal:     General: Abdomen is flat.     Palpations: Abdomen is soft.  Musculoskeletal:     Cervical back: Normal range of motion and neck supple.     Comments: Well-healed axillary incision bilaterally  Skin:    General: Skin is warm.     Capillary Refill: Capillary refill takes less than 2 seconds.  Neurological:     General: No focal deficit present.     Mental Status: She is alert.  Psychiatric:        Mood and Affect: Mood normal.        Behavior: Behavior normal.        Thought Content: Thought content normal.    Data: +-----------------+-------------+----------+--------------+  Right Cephalic   Diameter (cm)Depth (cm)   Findings     +-----------------+-------------+----------+--------------+  Shoulder             0.18        0.70                   +-----------------+-------------+----------+--------------+  Prox upper arm       0.16        0.38                   +-----------------+-------------+----------+--------------+  Mid upper arm        0.16        0.28                   +-----------------+-------------+----------+--------------+  Dist upper arm       0.09        0.37                   +-----------------+-------------+----------+--------------+  Antecubital fossa                       not visualized  +-----------------+-------------+----------+--------------+  Prox forearm         0.16        0.23                   +-----------------+-------------+----------+--------------+  Mid forearm  0.11        0.21                    +-----------------+-------------+----------+--------------+  Dist forearm         0.10        0.13                   +-----------------+-------------+----------+--------------+  Wrist                                   not visualized  +-----------------+-------------+----------+--------------+   +-----------------+-------------+----------+--------------+  Right Basilic    Diameter (cm)Depth (cm)   Findings     +-----------------+-------------+----------+--------------+  Prox upper arm       0.40        1.47                   +-----------------+-------------+----------+--------------+  Mid upper arm        0.24        1.17                   +-----------------+-------------+----------+--------------+  Dist upper arm       0.32        0.81     branching     +-----------------+-------------+----------+--------------+  Antecubital fossa    0.29        1.11                   +-----------------+-------------+----------+--------------+  Prox forearm         0.22        1.62     branching     +-----------------+-------------+----------+--------------+  Mid forearm                             not visualized  +-----------------+-------------+----------+--------------+  Distal forearm                          not visualized  +-----------------+-------------+----------+--------------+  Wrist                                   not visualized  +-----------------+-------------+----------+--------------+   +-----------------+-------------+----------+---------+  Left Cephalic    Diameter (cm)Depth (cm)Findings   +-----------------+-------------+----------+---------+  Shoulder             0.08        0.33              +-----------------+-------------+----------+---------+  Prox upper arm       0.10        0.32              +-----------------+-------------+----------+---------+  Mid upper arm        0.07        0.33               +-----------------+-------------+----------+---------+  Dist upper arm       0.09        0.37              +-----------------+-------------+----------+---------+  Antecubital fossa    0.09        0.22              +-----------------+-------------+----------+---------+  Prox forearm         0.09  0.23              +-----------------+-------------+----------+---------+  Mid forearm          0.08        0.23              +-----------------+-------------+----------+---------+  Dist forearm         0.11        0.28              +-----------------+-------------+----------+---------+  Wrist                0.09        0.35   branching  +-----------------+-------------+----------+---------+   +-----------------+-------------+----------+----------------+  Left Basilic     Diameter (cm)Depth (cm)    Findings      +-----------------+-------------+----------+----------------+  Prox upper arm       0.33        1.10                     +-----------------+-------------+----------+----------------+  Mid upper arm        0.31        1.32      branching      +-----------------+-------------+----------+----------------+  Dist upper arm       0.26        1.16                     +-----------------+-------------+----------+----------------+  Antecubital fossa    0.21        1.05                     +-----------------+-------------+----------+----------------+  Prox forearm         0.22        1.63   chronic thrombus  +-----------------+-------------+----------+----------------+  Mid forearm                              not visualized   +-----------------+-------------+----------+----------------+  Distal forearm                           not visualized   +-----------------+-------------+----------+----------------+  Wrist                                    not visualized    +-----------------+-------------+----------+----------------+   Summary: Right: Thrombus noted in one of the paired veins immediately  after         brachial artery bifurcation (upper arm- high bifurcation).  Left: Thrombus noted in the basilic vein in the proximal forearm.       Right Pre-Dialysis Findings:  +----------------------+----------+-------------------+--------+-----------  ----+  Location              PSV (cm/s)Intralum. Diam.    WaveformComments                                          (cm)                                         +----------------------+----------+-------------------+--------+-----------  ----+  Brachial Antecub.     [place]  0.42               biphasichigh              fossa                                                        bifurcation      +----------------------+----------+-------------------+--------+-----------  ----+  Radial Art at Wrist   82        0.22               biphasic                  +----------------------+----------+-------------------+--------+-----------  ----+  Ulnar Art at Wrist    81        0.15               biphasic                  +----------------------+----------+-------------------+--------+-----------  ----+        Left Pre-Dialysis Findings:  +----------------------+----------+-------------------+--------+-----------  ----+  Location              PSV (cm/s)Intralum. Diam.    WaveformComments                                          (cm)                                         +----------------------+----------+-------------------+--------+-----------  ----+  Brachial Antecub.     106       0.41               biphasichigh              fossa                                                        bifurcation      +----------------------+----------+-------------------+--------+-----------  ----+  Radial Art at Wrist   73        0.23                biphasic                  +----------------------+----------+-------------------+--------+-----------  ----+  Ulnar Art at Wrist    78        0.16               biphasic                  +----------------------+----------+-------------------+--------+-----------  ----+        Assessment/Plan:     75 year old female with chronic kidney disease sent for evaluation of AV fistula versus graft.  She does not have any suitable vein for access in her upper extremities.  At this time we will wait on AV graft.  We will have [facility] call us to schedule, she does not need to be evaluated again unless she  has specific questions.  We will plan for left arm AV graft given that patient is right-hand dominant despite bilateral previous axillary lymphadenectomies she has no edema at this time.     Waynetta Sandy MD [facility]

## 2021-03-05 ENCOUNTER — Encounter: Payer: Self-pay | Admitting: Family Medicine

## 2021-03-05 ENCOUNTER — Other Ambulatory Visit: Payer: Self-pay

## 2021-03-05 ENCOUNTER — Ambulatory Visit: Payer: Medicare Other | Attending: Family Medicine | Admitting: Family Medicine

## 2021-03-05 VITALS — BP 126/55 | HR 62 | Ht 62.0 in | Wt 137.8 lb

## 2021-03-05 DIAGNOSIS — I509 Heart failure, unspecified: Secondary | ICD-10-CM | POA: Insufficient documentation

## 2021-03-05 DIAGNOSIS — R2681 Unsteadiness on feet: Secondary | ICD-10-CM | POA: Diagnosis not present

## 2021-03-05 DIAGNOSIS — Z713 Dietary counseling and surveillance: Secondary | ICD-10-CM | POA: Insufficient documentation

## 2021-03-05 DIAGNOSIS — E1122 Type 2 diabetes mellitus with diabetic chronic kidney disease: Secondary | ICD-10-CM | POA: Diagnosis not present

## 2021-03-05 DIAGNOSIS — E1149 Type 2 diabetes mellitus with other diabetic neurological complication: Secondary | ICD-10-CM | POA: Diagnosis not present

## 2021-03-05 DIAGNOSIS — E1140 Type 2 diabetes mellitus with diabetic neuropathy, unspecified: Secondary | ICD-10-CM | POA: Diagnosis present

## 2021-03-05 DIAGNOSIS — I728 Aneurysm of other specified arteries: Secondary | ICD-10-CM | POA: Diagnosis not present

## 2021-03-05 DIAGNOSIS — R058 Other specified cough: Secondary | ICD-10-CM

## 2021-03-05 DIAGNOSIS — I130 Hypertensive heart and chronic kidney disease with heart failure and stage 1 through stage 4 chronic kidney disease, or unspecified chronic kidney disease: Secondary | ICD-10-CM | POA: Diagnosis not present

## 2021-03-05 DIAGNOSIS — I671 Cerebral aneurysm, nonruptured: Secondary | ICD-10-CM | POA: Diagnosis not present

## 2021-03-05 DIAGNOSIS — Z888 Allergy status to other drugs, medicaments and biological substances status: Secondary | ICD-10-CM | POA: Diagnosis not present

## 2021-03-05 DIAGNOSIS — I6509 Occlusion and stenosis of unspecified vertebral artery: Secondary | ICD-10-CM | POA: Insufficient documentation

## 2021-03-05 DIAGNOSIS — Z8249 Family history of ischemic heart disease and other diseases of the circulatory system: Secondary | ICD-10-CM | POA: Insufficient documentation

## 2021-03-05 DIAGNOSIS — Z7182 Exercise counseling: Secondary | ICD-10-CM | POA: Diagnosis not present

## 2021-03-05 DIAGNOSIS — Z79899 Other long term (current) drug therapy: Secondary | ICD-10-CM | POA: Insufficient documentation

## 2021-03-05 DIAGNOSIS — Z9109 Other allergy status, other than to drugs and biological substances: Secondary | ICD-10-CM | POA: Diagnosis not present

## 2021-03-05 DIAGNOSIS — Z7982 Long term (current) use of aspirin: Secondary | ICD-10-CM | POA: Insufficient documentation

## 2021-03-05 DIAGNOSIS — I651 Occlusion and stenosis of basilar artery: Secondary | ICD-10-CM | POA: Diagnosis not present

## 2021-03-05 DIAGNOSIS — I129 Hypertensive chronic kidney disease with stage 1 through stage 4 chronic kidney disease, or unspecified chronic kidney disease: Secondary | ICD-10-CM

## 2021-03-05 DIAGNOSIS — N184 Chronic kidney disease, stage 4 (severe): Secondary | ICD-10-CM | POA: Diagnosis not present

## 2021-03-05 MED ORDER — AMLODIPINE BESYLATE 10 MG PO TABS
10.0000 mg | ORAL_TABLET | Freq: Every day | ORAL | 1 refills | Status: DC
  Filled 2021-03-05: qty 90, 90d supply, fill #0
  Filled 2021-05-28 – 2021-05-31 (×2): qty 90, 90d supply, fill #1

## 2021-03-05 MED ORDER — MISC. DEVICES MISC: 0 refills | Status: AC

## 2021-03-05 MED ORDER — DULOXETINE HCL 30 MG PO CPEP
30.0000 mg | ORAL_CAPSULE | Freq: Every day | ORAL | 1 refills | Status: DC
  Filled 2021-03-05: qty 90, 90d supply, fill #0

## 2021-03-05 MED ORDER — CARVEDILOL 12.5 MG PO TABS
12.5000 mg | ORAL_TABLET | Freq: Two times a day (BID) | ORAL | 1 refills | Status: DC
  Filled 2021-03-05 – 2021-03-19 (×2): qty 180, 90d supply, fill #0
  Filled 2021-06-14: qty 180, 90d supply, fill #1

## 2021-03-05 MED ORDER — ROSUVASTATIN CALCIUM 10 MG PO TABS
ORAL_TABLET | Freq: Every day | ORAL | 1 refills | Status: DC
  Filled 2021-03-05: qty 90, 90d supply, fill #0

## 2021-03-05 NOTE — Patient Instructions (Signed)
Diabetic Neuropathy Diabetic neuropathy refers to nerve damage that is caused by diabetes. Over time, people with diabetes can develop nerve damage throughout the body. There are several types of diabetic neuropathy: Peripheral neuropathy. This is the most common type of diabetic neuropathy. It damages the nerves that carry signals between the spinal cord and other parts of the body (peripheral nerves). This usually affects nerves in the feet, legs, hands, and arms. Autonomic neuropathy. This type causes damage to nerves that control involuntary functions (autonomic nerves). Involuntary functions are functions of the body that you do not control. They include heartbeat, body temperature, blood pressure, urination, digestion, sweating, sexual function, or response to changes in blood glucose. Focal neuropathy. This type of nerve damage affects one area of the body, such as an arm, a leg, or the face. The injury may involve one nerve or a small group of nerves. Focal neuropathy can be painful and unpredictable. It occurs most often in older adults with diabetes. This often develops suddenly, but usually improves over time and does not cause long-term problems. Proximal neuropathy. This type of nerve damage affects the nerves of the thighs, hips, buttocks, or legs. It causes severe pain, weakness, and muscle death (atrophy), usually in the thigh muscles. It is more common among older men and people who have type 2 diabetes. The length of recovery time may vary. What are the causes? Peripheral, autonomic, and focal neuropathies are caused by diabetes that is not well controlled with treatment. The cause of proximal neuropathy is not known, but it may be caused by inflammation related to uncontrolled blood glucose levels. What are the signs or symptoms? Peripheral neuropathy Peripheral neuropathy develops slowly over time. When the nerves of the feet and legs no longer work, you may experience: Burning,  stabbing, or aching pain in the legs or feet. Pain or cramping in the legs or feet. Loss of feeling (numbness) and inability to feel pressure or pain in the feet. This can lead to: Thick calluses or sores on areas of constant pressure. Ulcers. Reduced ability to feel temperature changes. Foot deformities. Muscle weakness. Loss of balance or coordination. Autonomic neuropathy The symptoms of autonomic neuropathy vary depending on which nerves are affected. Symptoms may include: Problems with digestion, such as: Nausea or vomiting. Poor appetite. Bloating. Diarrhea or constipation. Trouble swallowing. Losing weight without trying to. Problems with the heart, blood, and lungs, such as: Dizziness, especially when standing up. Fainting. Shortness of breath. Irregular heartbeat. Bladder problems, such as: Trouble starting or stopping urination. Leaking urine. Trouble emptying the bladder. Urinary tract infections (UTIs). Problems with other body functions, such as: Sweat. You may sweat too much or too little. Temperature. You might get hot easily. Or, you might feel cold more than usual. Sexual function. Men may not be able to get or maintain an erection. Women may have vaginal dryness and difficulty with arousal. Focal neuropathy Symptoms affect only one area of the body. Common symptoms include: Numbness. Tingling. Burning pain. Prickling feeling. Very sensitive skin. Weakness. Inability to move (paralysis). Muscle twitching. Muscles getting smaller (wasting). Poor coordination. Double or blurred vision. Proximal neuropathy Sudden, severe pain in the hip, thigh, or buttocks. Pain may spread from the back into the legs (sciatica). Pain and numbness in the arms and legs. Tingling. Loss of bladder control or bowel control. Weakness and wasting of thigh muscles. Difficulty getting up from a seated position. Abdominal swelling. Unexplained weight loss. How is this  diagnosed? Diagnosis varies depending on the type   of neuropathy your health care provider suspects. Peripheral neuropathy Your health care provider will do a neurologic exam. This exam checks your reflexes, how you move, and what you can feel. You may have other tests, such as: Blood tests. Tests of the fluid that surrounds the spinal cord (lumbar puncture). CT scan. MRI. Checking the nerves that control muscles (electromyogram, or EMG). Checking how quickly signals pass through your nerves (nerve conduction study). Checking a small piece of a nerve using a microscope (biopsy). Autonomic neuropathy You may have tests, such as: Tests to measure your blood pressure and heart rate. You may be secured to an exam table that moves you from a lying position to an upright position (table tilt test). Breathing tests to check your lungs. Tests to check how food moves through the digestive system (gastric emptying tests). Blood, sweat, or urine tests. Ultrasound of your bladder. Spinal fluid tests. Focal neuropathy This condition may be diagnosed with: A neurologic exam. CT scan. MRI. EMG. Nerve conduction study. Proximal neuropathy There is no test to diagnose this type of neuropathy. You may have tests to rule out other possible causes of this type of neuropathy. Tests may include: X-rays of your spine and lumbar region. Lumbar puncture. MRI. How is this treated? The goal of treatment is to keep nerve damage from getting worse. Treatment may include: Following your diabetes management plan. This will help keep your blood glucose level and your A1C level within your target range. This is the most important treatment. Using prescription pain medicine. Follow these instructions at home: Diabetes management Follow your diabetes management plan as told by your health care provider. Check your blood glucose levels. Keep your blood glucose in your target range. Have your A1C level checked at  least two times a year, or as often as told. Take over the counter and prescription medicines only as told by your health care provider. This includes insulin and diabetes medicine.  Lifestyle  Do not use any products that contain nicotine or tobacco, such as cigarettes, e-cigarettes, and chewing tobacco. If you need help quitting, ask your health care provider. Be physically active every day. Include strength training and balance exercises. Follow a healthy meal plan. Work with your health care provider to manage your blood pressure. General instructions Ask your health care provider if the medicine prescribed to you requires you to avoid driving or using machinery. Check your skin and feet every day for cuts, bruises, redness, blisters, or sores. Keep all follow-up visits. This is important. Contact a health care provider if: You have burning, stabbing, or aching pain in your legs or feet. You are unable to feel pressure or pain in your feet. You develop problems with digestion, such as: Nausea. Vomiting. Bloating. Constipation. Diarrhea. Abdominal pain. You have difficulty with urination, such as: Inability to control when you urinate (incontinence). Inability to completely empty the bladder (retention). You feel as if your heart is racing (palpitations). You feel dizzy, weak, or faint when you stand up. Get help right away if: You cannot urinate. You have sudden weakness or loss of coordination. You have trouble speaking. You have pain or pressure in your chest. You have an irregular heartbeat. You have sudden inability to move a part of your body. These symptoms may represent a serious problem that is an emergency. Do not wait to see if the symptoms will go away. Get medical help right away. Call your local emergency services (911 in the [place] Do not drive yourself to   the hospital. Summary Diabetic neuropathy is nerve damage that is caused by diabetes. It can cause numbness  and pain in the arms, legs, digestive tract, heart, and other body systems. This condition is treated by keeping your blood glucose level and your A1C level within your target range. This can help prevent neuropathy from getting worse. Check your skin and feet every day for cuts, bruises, redness, blisters, or sores. Do not use any products that contain nicotine or tobacco, such as cigarettes, e-cigarettes, and chewing tobacco. This information is not intended to replace advice given to you by your health care provider. Make sure you discuss any questions you have with your health care provider. Document Revised: 12/08/2019 Document Reviewed: 12/08/2019 [employer]vier Patient Education  2022 [employer]  

## 2021-03-05 NOTE — Progress Notes (Signed)
Subjective:  Patient ID: Paula [name], female    DOB: April 30, 1946  Age: 75 y.o. 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 Hospitalized 02/21/2021 through 02/23/2021 for headaches in the setting of hypertensive emergency.  Interval History: Seen by vascular surgery couple of days ago for evaluation for AV fistula versus graft. She informs me she was told she had a blockage in her arteries.  Review of her MRA of the head revealed: MRA:   1. Motion limited evaluation with similar appearance of 3 mm medially directed aneurysm arising from the proximal left cavernous ICA and 3 mm inferiorly projecting aneurysm arising from the supraclinoid left ICA. 2. Suspected additional 2-3 mm anterosuperiorly directed aneurysm arising from the supraclinoid left ICA, which also appears similar. 3. Similar ectatic left A1 ACA. 4. Similar atherosclerotic disease with moderate basilar artery stenosis an additional multifocal mild stenosis.   Mild for age chronic microvascular ischemic disease and atrophy.   She is yet to follow-up with interventional radiology Her BP at home have been 121-975 systolic but is 883/25 in the clinic today.  She is not followed by ENT any longer for her upper airway cough syndrome. Still has post nasal drip but states Flonase does not provide relief. With regards to her diabetes mellitus it is diet controlled.  She complains of numbness in her lateral 2 toes.  Also complains of a creeping sensation in her legs sometimes which she feels are due to her 'nerves'.  Complains of problems with her  balance and is undergoing PT. Past Medical History:  Diagnosis Date   Allergic rhinitis    Arthritis    Asthma    Brain aneurysm    Chronic kidney disease    Stage 3   Cough    Diabetes mellitus    Diastolic CHF, chronic ([place]) 10/11/2011   GERD (gastroesophageal reflux disease)    History of colon polyps 2012   tubular adenoma    Hyperlipidemia    Hypertension    Neuropathy 10/11/2011   PONV (postoperative nausea and vomiting)    one time after lymph node surgery    Past Surgical History:  Procedure Laterality Date   CATARACT EXTRACTION     right eye   IR 3D INDEPENDENT WKST  12/14/2017   IR ANGIO INTRA EXTRACRAN SEL COM CAROTID INNOMINATE BILAT MOD SED  09/15/2017   IR ANGIO INTRA EXTRACRAN SEL COM CAROTID INNOMINATE UNI L MOD SED  12/14/2017   IR ANGIO VERTEBRAL SEL VERTEBRAL BILAT MOD SED  09/15/2017   IR RADIOLOGIST EVAL & MGMT  09/10/2017   IR RADIOLOGIST EVAL & MGMT  10/19/2017   lymphatic mass surgery     lymphoma mass surgery Bilateral    non cancerous   NASAL TURBINATE REDUCTION     RADIOLOGY WITH ANESTHESIA N/A 12/14/2017   Procedure: RADIOLOGY WITH ANESTHESIA EMBOLIZATION;  Surgeon: Luanne Bras, MD;  Location: [place];  Service: Radiology;  Laterality: N/A;   VIDEO BRONCHOSCOPY Bilateral 11/18/2017   Procedure: VIDEO BRONCHOSCOPY WITHOUT FLUORO;  Surgeon: Collene Gobble, MD;  Location: [facility];  Service: Cardiopulmonary;  Laterality: Bilateral;    Family History  Problem Relation Age of Onset  Hypertension Mother    Asthma Sister    Colon cancer Neg Hx    Allergic rhinitis Neg Hx    Eczema Neg Hx     Allergies  Allergen Reactions   Dust Mite Extract Cough   Ace Inhibitors Cough   Lovastatin Other (See Comments)    Generalized body pain    Outpatient Medications Prior to Visit  Medication Sig Dispense Refill   aspirin EC 81 MG tablet Take 81 mg by mouth every evening.     hydrALAZINE (APRESOLINE) 25 MG tablet Take 1 tablet (25 mg total) by mouth  every 8 (eight) hours. 90 tablet 0   isosorbide mononitrate (IMDUR) 60 MG 24 hr tablet TAKE 1 TABLET (60 MG TOTAL) BY MOUTH DAILY. 90 tablet 0   amLODipine (NORVASC) 10 MG tablet Take 1 tablet (10 mg total) by mouth daily. 30 tablet 0   carvedilol (COREG) 12.5 MG tablet Take 1 tablet (12.5 mg total) by mouth 2 (two) times daily with a meal. 60 tablet 0   No facility-administered medications prior to visit.     ROS Review of Systems  Constitutional:  Negative for activity change, appetite change and fatigue.  HENT:  Negative for congestion, sinus pressure and sore throat.   Eyes:  Negative for visual disturbance.  Respiratory:  Positive for cough. Negative for chest tightness, shortness of breath and wheezing.   Cardiovascular:  Negative for chest pain and palpitations.  Gastrointestinal:  Negative for abdominal distention, abdominal pain and constipation.  Endocrine: Negative for polydipsia.  Genitourinary:  Negative for dysuria and frequency.  Musculoskeletal:  Positive for gait problem. Negative for arthralgias and back pain.  Skin:  Negative for rash.  Neurological:  Positive for numbness. Negative for tremors and light-headedness.  Hematological:  Does not bruise/bleed easily.  Psychiatric/Behavioral:  Negative for agitation and behavioral problems.    Objective:  BP (!) 126/55   Pulse 62   Ht 5' 2"  (1.575 m)   Wt 137 lb 12.8 oz (62.5 kg)   LMP  (LMP Unknown)   SpO2 99%   BMI 25.20 kg/m   BP/Weight 03/05/2021 03/01/2021 2/69/4854  Systolic BP 627 035 009  Diastolic BP 55 72 69  Wt. (Lbs) 137.8 136 -  BMI 25.2 24.87 -      Physical Exam Constitutional:      Appearance: She is well-developed.  Cardiovascular:     Rate and Rhythm: Normal rate.     Heart sounds: Normal heart sounds. No murmur heard. Pulmonary:     Effort: Pulmonary effort is normal.     Breath sounds: Normal breath sounds. No wheezing or rales.  Chest:     Chest wall: No tenderness.  Abdominal:      General: Bowel sounds are normal. There is no distension.     Palpations: Abdomen is soft. There is no mass.     Tenderness: There is no abdominal tenderness.  Musculoskeletal:        General: Normal range of motion.     Right lower leg: No edema.     Left lower leg: No edema.  Neurological:     Mental Status: She is alert and oriented to person, place, and time.     Gait: Gait abnormal.  Psychiatric:        Mood and Affect: Mood normal.    CMP Latest Ref Rng & Units 02/23/2021 02/22/2021 02/21/2021  Glucose 70 - 99 mg/dL 111(H) 157(H) 141(H)  BUN 8 - 23 mg/dL 48(H) 47(H)  46(H)  Creatinine 0.44 - 1.00 mg/dL 3.58(H) 3.56(H) 4.00(H)  Sodium 135 - 145 mmol/L 133(L) 134(L) 129(L)  Potassium 3.5 - 5.1 mmol/L 3.6 4.0 3.6  Chloride 98 - 111 mmol/L 99 101 92(L)  CO2 22 - 32 mmol/L 23 27 -  Calcium 8.9 - 10.3 mg/dL 8.7(L) 8.6(L) -  Total Protein 6.5 - 8.1 g/dL 6.0(L) - -  Total Bilirubin 0.3 - 1.2 mg/dL 0.6 - -  Alkaline Phos 38 - 126 U/L 59 - -  AST 15 - 41 U/L 16 - -  ALT 0 - 44 U/L 14 - -    Lipid Panel     Component Value Date/Time   CHOL 141 01/04/2021 1153   TRIG 78 01/04/2021 1153   HDL 63 01/04/2021 1153   CHOLHDL 2.2 01/04/2021 1153   CHOLHDL 2.9 01/25/2016 0901   VLDL 20 01/25/2016 0901   LDLCALC 63 01/04/2021 1153    CBC    Component Value Date/Time   WBC 5.9 02/23/2021 0151   RBC 3.26 (L) 02/23/2021 0151   HGB 9.9 (L) 02/23/2021 0151   HGB 12.4 09/02/2018 1211   HCT 26.8 (L) 02/23/2021 0151   HCT 37.3 09/02/2018 1211   PLT 184 02/23/2021 0151   PLT 203 09/02/2018 1211   MCV 82.2 02/23/2021 0151   MCV 87 09/02/2018 1211   MCH 30.4 02/23/2021 0151   MCHC 36.9 (H) 02/23/2021 0151   RDW 12.3 02/23/2021 0151   RDW 13.1 09/02/2018 1211   LYMPHSABS 1.4 02/23/2021 0151   LYMPHSABS 1.2 09/02/2018 1211   MONOABS 0.6 02/23/2021 0151   EOSABS 0.2 02/23/2021 0151   EOSABS 0.1 09/02/2018 1211   BASOSABS 0.0 02/23/2021 0151   BASOSABS 0.0 09/02/2018 1211    Lab  Results  Component Value Date   HGBA1C 5.8 01/04/2021    Assessment & Plan:  1. Type 2 diabetes mellitus with other neurologic complication, without long-term current use of insulin (HCC) Diet controlled with A1c of 5.8 Restarted Crestor for secondary cardiovascular prevention Initiated Cymbalta due to neuropathy Counseled on Diabetic diet, my plate method, 496 minutes of moderate intensity exercise/week Blood sugar logs with fasting goals of 80-120 mg/dl, random of less than 180 and in the event of sugars less than 60 mg/dl or greater than 400 mg/dl encouraged to notify the clinic. Advised on the need for annual eye exams, annual foot exams, Pneumonia vaccine. - rosuvastatin (CRESTOR) 10 MG tablet; TAKE 1 TABLET (10 MG TOTAL) BY MOUTH DAILY.  Dispense: 90 tablet; Refill: 1 - DULoxetine (CYMBALTA) 30 MG capsule; Take 1 capsule (30 mg total) by mouth daily. For diabetic neuropathy  Dispense: 90 capsule; Refill: 1  2. Basilar artery stenosis Advised to schedule appointment with Dr. Estanislado Pandy for follow-up - carvedilol (COREG) 12.5 MG tablet; Take 1 tablet (12.5 mg total) by mouth 2 (two) times daily with a meal.  Dispense: 180 tablet; Refill: 1  3. Upper airway cough syndrome Uncontrolled Provided with nasal irrigation kit as there could be sinus component Advised to resume antihistamine and intranasal steroid  4. Hypertension in stage 4 chronic kidney disease due to type 2 diabetes mellitus ([place]) Controlled but blood pressure is in the hypotensive range At her visit with vascular surgery 4 days ago blood pressure was 155/72 Currently being worked up for possible renal replacement therapy Followed by nephrology Counseled on blood pressure goal of less than 130/80, low-sodium, DASH diet, medication compliance, 150 minutes of moderate intensity exercise per week. Discussed medication compliance, adverse effects. -  amLODipine (NORVASC) 10 MG tablet; Take 1 tablet (10 mg total) by mouth  daily.  Dispense: 90 tablet; Refill: 1  5. Unstable gait Advised to change positions slowly - Misc. Devices MISC; Cane.  Diagnosis - unstable gait  Dispense: 1 each; Refill: 0    Meds ordered this encounter  Medications   rosuvastatin (CRESTOR) 10 MG tablet    Sig: TAKE 1 TABLET (10 MG TOTAL) BY MOUTH DAILY.    Dispense:  90 tablet    Refill:  1   amLODipine (NORVASC) 10 MG tablet    Sig: Take 1 tablet (10 mg total) by mouth daily.    Dispense:  90 tablet    Refill:  1   DULoxetine (CYMBALTA) 30 MG capsule    Sig: Take 1 capsule (30 mg total) by mouth daily. For diabetic neuropathy    Dispense:  90 capsule    Refill:  1   carvedilol (COREG) 12.5 MG tablet    Sig: Take 1 tablet (12.5 mg total) by mouth 2 (two) times daily with a meal.    Dispense:  180 tablet    Refill:  1   Misc. Devices MISC    Sig: Cane.  Diagnosis - unstable gait    Dispense:  1 each    Refill:  0    Follow-up: Return in about 3 months (around 06/05/2021) for Medical conditions.       Charlott Rakes, MD, FAAFP. [facility] [place] [place], [place]   03/05/2021, 1:00 PM

## 2021-03-06 ENCOUNTER — Telehealth: Payer: Self-pay

## 2021-03-06 NOTE — Telephone Encounter (Signed)
Call received from Courtney Johnson, [facility] who confirmed patient has been certified for services.  

## 2021-03-07 ENCOUNTER — Other Ambulatory Visit: Payer: Self-pay

## 2021-03-15 ENCOUNTER — Other Ambulatory Visit: Payer: Self-pay

## 2021-03-15 ENCOUNTER — Encounter: Payer: Self-pay | Admitting: Emergency Medicine

## 2021-03-15 ENCOUNTER — Ambulatory Visit (INDEPENDENT_AMBULATORY_CARE_PROVIDER_SITE_OTHER): Payer: Medicare Other | Admitting: Emergency Medicine

## 2021-03-15 DIAGNOSIS — J3089 Other allergic rhinitis: Secondary | ICD-10-CM

## 2021-03-15 DIAGNOSIS — I651 Occlusion and stenosis of basilar artery: Secondary | ICD-10-CM

## 2021-03-15 MED ORDER — FLUTICASONE PROPIONATE 50 MCG/ACT NA SUSP
2.0000 | Freq: Every day | NASAL | 2 refills | Status: DC
  Filled 2021-03-15: qty 16, 30d supply, fill #0

## 2021-03-15 MED ORDER — LORATADINE 10 MG PO TABS
10.0000 mg | ORAL_TABLET | Freq: Every day | ORAL | 11 refills | Status: DC
  Filled 2021-03-15: qty 30, 30d supply, fill #0
  Filled 2021-07-16: qty 30, 30d supply, fill #1
  Filled 2021-09-26: qty 30, 30d supply, fill #0
  Filled 2021-10-21: qty 30, 30d supply, fill #1
  Filled 2021-10-29 – 2021-12-02 (×2): qty 30, 30d supply, fill #2
  Filled 2021-12-31: qty 30, 30d supply, fill #3
  Filled 2022-01-26: qty 30, 30d supply, fill #4
  Filled 2022-03-05: qty 30, 30d supply, fill #5

## 2021-03-15 NOTE — Assessment & Plan Note (Signed)
With significant upper airway obstruction.  On her exam her turbinates are large and that her nasal opening very small.  She may need to be seen again by ENT.  We tried to refer her but it sounds like her insurance was not accepted.  For now I will try her on fluticasone nasal spray, loratadine.  She continues to have nasal obstruction and shortness of breath at night then we will try to refer her to ENT as above.

## 2021-03-15 NOTE — Patient Instructions (Signed)
Please start taking loratadine 10 mg once daily. Please start taking fluticasone nasal spray, 2 sprays each nostril twice a day.  Do not take this medication right before going to bed, instead take it about an hour before bedtime. Follow with [facility] in 1 month.  If you are still having nasal obstruction then we can try again to refer you for ENT evaluation that is excepted by your insurance.

## 2021-03-15 NOTE — Addendum Note (Signed)
Addended by: Lorretta Harp on: 03/15/2021 04:43 PM   Modules accepted: Orders

## 2021-03-15 NOTE — Progress Notes (Signed)
Subjective:    Patient ID: Paula [name], female    DOB: 1946/07/03, 75 y.o.   MRN: [id number]  Cough Associated symptoms include postnasal drip. Pertinent negatives include no rash, sore throat, shortness of breath or wheezing.   ROV 06/23/18 --75 year old woman with a history of diabetes and hypertension.  Seen here for chronic cough with contributions from chronic upper airway irritation, allergic rhinitis, GERD.  She had reassuring PFT.  Bronchoscopy showed upper airway erythema. She restarted zyrtec, flonase (recently ran out). ? atrovent or singulair or protonix >> she doesn't know. No longer on gabapentin, did not help her. She has seen ENT - reassuring eval. She was recommended to go to cough behavioral therapy, but hasn't done so to date.   ROV 03/15/21 --follow-up visit for 75 year old woman with a history of diabetes and hypertension.  She has chronic upper airway irritation syndrome and chronic cough in the setting of allergic rhinitis and GERD.  Her pulmonary function testing has been reassuring.  She underwent bronchoscopy that did not show any evidence of endobronchial lesion or other abnormality.  ENT evaluation in the past has been reassuring as well.  She was apparently admitted with hypertensive urgency in mid July 2022.  She tells me that her breathing has worsened since last time, having nasal obstruction. She is unable to sleep supine. Lots of clear mucous. She is having daily cough.    Review of Systems  HENT:  Positive for congestion, postnasal drip and sneezing. Negative for sinus pressure and sore throat.   Respiratory:  Positive for cough. Negative for chest tightness, shortness of breath and wheezing.   Cardiovascular:  Negative for palpitations.  Skin:  Negative for rash.  Allergic/Immunologic: Negative.   Psychiatric/Behavioral:  The patient is not nervous/anxious.    Past Medical History:  Diagnosis Date   Allergic rhinitis    Arthritis    Asthma     Brain aneurysm    Chronic kidney disease    Stage 3   Cough    Diabetes mellitus    Diastolic CHF, chronic ([place]) 10/11/2011   GERD (gastroesophageal reflux disease)    History of colon polyps 2012   tubular adenoma    Hyperlipidemia    Hypertension    Neuropathy 10/11/2011   PONV (postoperative nausea and vomiting)    one time after lymph node surgery     Family History  Problem Relation Age of Onset   Hypertension Mother    Asthma Sister    Colon cancer Neg Hx    Allergic rhinitis Neg Hx    Eczema Neg Hx      Social History   Socioeconomic History   Marital status: Divorced    Spouse name: Not on file   Number of children: 5   Years of education: Not on file   Highest education level: Master's degree (e.g., MA, MS, MEng, MEd, MSW, MBA)  Occupational History   Occupation: Unemployed  Tobacco Use   Smoking status: Never   Smokeless tobacco: Never  Vaping Use   Vaping Use: Never used  Substance and Sexual Activity   Alcohol use: No   Drug use: No   Sexual activity: Not Currently    Birth control/protection: None  Other Topics Concern   Not on file  Social History Narrative   Hevyn lives in a 75 year old apartment with no concern for water damage or mildew.  Flooring is carpet throughout.  Heating is electric and cooling is central.  There are no  animals located in the home, however, there is 1 dog across the hallway.  There are no roaches in the home and there are no dust mite free covers on the bed or pillow.  She is exposed to tobacco smoke in her home.  There are no concerns for exposure to fumes, chemicals, or dust in the home.      Paient is right-handed. She avoids caffeine, she walks daily. She lives alone in a first floor apartment. She had 5 children, one died in childhood, unknown causes, another as a young adult with kidney disease, in [place].   Social Determinants of Health   Financial Resource Strain: Not on file  Food Insecurity: Not on file   Transportation Needs: Not on file  Physical Activity: Not on file  Stress: Not on file  Social Connections: Not on file  Intimate Partner Violence: Not on file  Originally from [name], has lived [place], [place], [place] Has worked as Education officer, museum   Allergies  Allergen Reactions   Dust Mite Extract Cough   Ace Inhibitors Cough   Lovastatin Other (See Comments)    Generalized body pain     Outpatient Medications Prior to Visit  Medication Sig Dispense Refill   amLODipine (NORVASC) 10 MG tablet Take 1 tablet (10 mg total) by mouth daily. 90 tablet 1   aspirin EC 81 MG tablet Take 81 mg by mouth every evening.     carvedilol (COREG) 12.5 MG tablet Take 1 tablet (12.5 mg total) by mouth 2 (two) times daily with a meal. 180 tablet 1   DULoxetine (CYMBALTA) 30 MG capsule Take 1 capsule (30 mg total) by mouth daily. For diabetic neuropathy 90 capsule 1   hydrALAZINE (APRESOLINE) 25 MG tablet Take 1 tablet (25 mg total) by mouth every 8 (eight) hours. 90 tablet 0   isosorbide mononitrate (IMDUR) 60 MG 24 hr tablet TAKE 1 TABLET (60 MG TOTAL) BY MOUTH DAILY. 90 tablet 0   Misc. Devices [employer].  Diagnosis - unstable gait 1 each 0   rosuvastatin (CRESTOR) 10 MG tablet TAKE 1 TABLET (10 MG TOTAL) BY MOUTH DAILY. 90 tablet 1   No facility-administered medications prior to visit.        Objective:   Physical Exam  Vitals:   03/15/21 1619  BP: (!) 118/58  Pulse: 61  Temp: 98 F (36.7 C)  TempSrc: Oral  SpO2: 99%  Weight: 139 lb 12.8 oz (63.4 kg)  Height: '5\' 2"'$  (1.575 m)   Gen: Pleasant, well-nourished, in no distress,  normal affect  ENT: No lesions,  mouth clear,  oropharynx clear, nasal passages are very narrow, turbinates erythematous  Neck: No JVD, no stridor  Lungs: No use of accessory muscles, no wheezing, no crackles  Cardiovascular: RRR, heart sounds normal, no murmur or gallops, no peripheral edema  Musculoskeletal: No deformities, no cyanosis or clubbing  Neuro: alert, non  focal  Skin: Warm, no lesions or rashes                                                                           Assessment & Plan:  Allergic rhinitis With significant upper airway obstruction.  On her exam her turbinates are large and  that her nasal opening very small.  She may need to be seen again by ENT.  We tried to refer her but it sounds like her insurance was not accepted.  For now I will try her on fluticasone nasal spray, loratadine.  She continues to have nasal obstruction and shortness of breath at night then we will try to refer her to ENT as above.  Baltazar Apo, MD, PhD 03/15/2021, 4:33 PM [facility] [phone] or if no answer [phone]

## 2021-03-18 ENCOUNTER — Other Ambulatory Visit: Payer: Self-pay

## 2021-03-19 ENCOUNTER — Other Ambulatory Visit: Payer: Self-pay

## 2021-04-09 ENCOUNTER — Ambulatory Visit: Payer: Medicare Other | Admitting: Family Medicine

## 2021-04-16 ENCOUNTER — Ambulatory Visit: Payer: Medicare Other | Admitting: Primary Care

## 2021-05-02 ENCOUNTER — Telehealth: Payer: Self-pay | Admitting: Family Medicine

## 2021-05-02 ENCOUNTER — Telehealth: Payer: Self-pay | Admitting: Emergency Medicine

## 2021-05-02 DIAGNOSIS — J3089 Other allergic rhinitis: Secondary | ICD-10-CM

## 2021-05-02 DIAGNOSIS — J302 Other seasonal allergic rhinitis: Secondary | ICD-10-CM

## 2021-05-02 MED ORDER — ATORVASTATIN CALCIUM 80 MG PO TABS
80.0000 mg | ORAL_TABLET | Freq: Every day | ORAL | 1 refills | Status: DC
  Filled 2021-05-02: qty 90, 90d supply, fill #0
  Filled 2021-07-26: qty 90, 90d supply, fill #1

## 2021-05-02 NOTE — Telephone Encounter (Signed)
Cholesterol was done in 12/2019 was normal hence previous to early to repeat.  I have substituted Crestor with atorvastatin.

## 2021-05-02 NOTE — Telephone Encounter (Signed)
Will forward to provider to see if another medication can be prescribed

## 2021-05-02 NOTE — Telephone Encounter (Signed)
Yes I think we should. Thanks

## 2021-05-02 NOTE — Telephone Encounter (Signed)
ENT referral made and she is aware someone will call her with appt. She is to also keep appt next wk here.

## 2021-05-02 NOTE — Telephone Encounter (Signed)
Pt called and stated that the Rx for rosuvastatin (CRESTOR) 10 MG tablet is causing her to cough really bad and she would like to change it/ she stated she has mentioned this to the provider before/ pt also mentioned coming in to recheck her cholesterol and have blood work done/ please advise

## 2021-05-02 NOTE — Telephone Encounter (Signed)
Spoke with the pt  She states still having SOB and nasal congestion since last ov here 03/15/21  She states she is doing everything Dr Lamonte Sakai rec- flonase, loratidine She was supposed to come back in a month and appt was cancelled  I have scheduled her with TP for 05/07/21  Dr Lamonte Sakai- do you want me to go ahead and place referral to ENT now? Thanks

## 2021-05-03 ENCOUNTER — Other Ambulatory Visit: Payer: Self-pay

## 2021-05-03 NOTE — Telephone Encounter (Signed)
Contacted pt to go over provider response pt is aware and doesn't have any questions or concerns  

## 2021-05-06 ENCOUNTER — Other Ambulatory Visit: Payer: Self-pay

## 2021-05-07 ENCOUNTER — Other Ambulatory Visit: Payer: Self-pay

## 2021-05-07 ENCOUNTER — Encounter: Payer: Self-pay | Admitting: Adult Health

## 2021-05-07 ENCOUNTER — Ambulatory Visit (INDEPENDENT_AMBULATORY_CARE_PROVIDER_SITE_OTHER): Payer: Medicare Other | Admitting: Adult Health

## 2021-05-07 VITALS — BP 128/66 | HR 66 | Temp 98.4°F | Ht 62.0 in | Wt 145.6 lb

## 2021-05-07 DIAGNOSIS — R058 Other specified cough: Secondary | ICD-10-CM | POA: Diagnosis not present

## 2021-05-07 DIAGNOSIS — I651 Occlusion and stenosis of basilar artery: Secondary | ICD-10-CM | POA: Diagnosis not present

## 2021-05-07 DIAGNOSIS — J302 Other seasonal allergic rhinitis: Secondary | ICD-10-CM

## 2021-05-07 DIAGNOSIS — J3089 Other allergic rhinitis: Secondary | ICD-10-CM

## 2021-05-07 DIAGNOSIS — J849 Interstitial pulmonary disease, unspecified: Secondary | ICD-10-CM | POA: Diagnosis not present

## 2021-05-07 MED ORDER — ALBUTEROL SULFATE HFA 108 (90 BASE) MCG/ACT IN AERS
1.0000 | INHALATION_SPRAY | Freq: Four times a day (QID) | RESPIRATORY_TRACT | 2 refills | Status: DC | PRN
  Filled 2021-05-07: qty 8.5, 25d supply, fill #0
  Filled 2021-07-26: qty 8.5, 25d supply, fill #1

## 2021-05-07 NOTE — Progress Notes (Signed)
$'@Patient'M$  ID: Paula [name], female    DOB: March 26, 1946, 75 y.o.   MRN: [id number]  Chief Complaint  Patient presents with   Follow-up    Referring provider: Charlott Rakes, MD  HPI: 75 yo female never smoker followed for allergic rhinitis , chronic cough  Medical history significant for chronic kidney disease   TEST/EVENTS :  PFT 12/2017 normal lung function with FEV1 at 119%, ratio 86, FVC 107%, no significant bronchodilator response, DLCO 62%  Chronic cough work-up-bronchoscopy showed upper airway erythema. ENT evaluation unrevealing. Previously tried on gabapentin without significant improvement  CBC with differential February 23, 2021 absolute eosinophil count 200  05/07/2021 Follow up : Chronic cough  Patient returns for a 6-week follow-up.  Patient was seen last visit with increased cough.  She was recommended to start Claritin and Flonase.  She was also referred to ENT.  Patient says she was unable to take Claritin and it caused her lower ankles to swell.  Patient complains that she is having ongoing cough that is severe at times.  She has severe coughing paroxysms.  She denies any discolored mucus, chest pain, orthopnea.  Currently not using any cough medicines for her cough. She has been referred to ENT is awaiting appointment. Cough is worse at night.    Allergies  Allergen Reactions   Dust Mite Extract Cough   Ace Inhibitors Cough   Lovastatin Other (See Comments)    Generalized body pain    Immunization History  Administered Date(s) Administered   Influenza Split 05/13/2012, 04/12/2015   Influenza Whole 07/12/2011   Influenza,inj,Quad PF,6+ Mos 05/06/2013, 04/28/2016, 05/07/2017, 06/15/2018, 05/10/2019   PFIZER(Purple Top)SARS-COV-2 Vaccination 01/19/2021   Pneumococcal Conjugate-13 05/10/2019   Pneumococcal Polysaccharide-23 10/11/2011    Past Medical History:  Diagnosis Date   Allergic rhinitis    Arthritis    Asthma    Brain aneurysm    Chronic  kidney disease    Stage 3   Cough    Diabetes mellitus    Diastolic CHF, chronic ([place]) 10/11/2011   GERD (gastroesophageal reflux disease)    History of colon polyps 2012   tubular adenoma    Hyperlipidemia    Hypertension    Neuropathy 10/11/2011   PONV (postoperative nausea and vomiting)    one time after lymph node surgery    Tobacco History: Social History   Tobacco Use  Smoking Status Never  Smokeless Tobacco Never   Counseling given: Not Answered   Outpatient Medications Prior to Visit  Medication Sig Dispense Refill   amLODipine (NORVASC) 10 MG tablet Take 1 tablet (10 mg total) by mouth daily. 90 tablet 1   aspirin EC 81 MG tablet Take 81 mg by mouth every evening.     carvedilol (COREG) 12.5 MG tablet Take 1 tablet (12.5 mg total) by mouth 2 (two) times daily with a meal. 180 tablet 1   isosorbide mononitrate (IMDUR) 60 MG 24 hr tablet TAKE 1 TABLET (60 MG TOTAL) BY MOUTH DAILY. 90 tablet 0   loratadine (CLARITIN) 10 MG tablet Take 1 tablet (10 mg total) by mouth daily. 30 tablet 11   Misc. Devices [employer].  Diagnosis - unstable gait 1 each 0   atorvastatin (LIPITOR) 80 MG tablet Take 1 tablet (80 mg total) by mouth daily. (Patient not taking: Reported on 05/07/2021) 90 tablet 1   DULoxetine (CYMBALTA) 30 MG capsule Take 1 capsule (30 mg total) by mouth daily. For diabetic neuropathy (Patient not taking: Reported on 05/07/2021) 90  capsule 1   fluticasone (FLONASE) 50 MCG/ACT nasal spray Place 2 sprays into both nostrils daily. (Patient not taking: Reported on 05/07/2021) 16 g 2   hydrALAZINE (APRESOLINE) 25 MG tablet Take 1 tablet (25 mg total) by mouth every 8 (eight) hours. 90 tablet 0   No facility-administered medications prior to visit.     Review of Systems:   Constitutional:   No  weight loss, night sweats,  Fevers, chills,  +fatigue, or  lassitude.  HEENT:   No headaches,  Difficulty swallowing,  Tooth/dental problems, or  Sore throat,                No  sneezing, itching, ear ache,  +nasal congestion, post nasal drip,   CV:  No chest pain,  Orthopnea, PND, swelling in lower extremities, anasarca, dizziness, palpitations, syncope.   GI  No heartburn, indigestion, abdominal pain, nausea, vomiting, diarrhea, change in bowel habits, loss of appetite, bloody stools.   Resp: .  No chest wall deformity  Skin: no rash or lesions.  GU: no dysuria, change in color of urine, no urgency or frequency.  No flank pain, no hematuria   MS:  No joint pain or swelling.  No decreased range of motion.  No back pain.    Physical Exam  BP 128/66 (BP Location: Left Arm, Patient Position: Sitting, Cuff Size: Normal)   Pulse 66   Temp 98.4 F (36.9 C) (Oral)   Ht '5\' 2"'$  (1.575 m)   Wt 145 lb 9.6 oz (66 kg)   LMP  (LMP Unknown)   SpO2 100%   BMI 26.63 kg/m   GEN: A/Ox3; pleasant , NAD, well nourished    HEENT:  [place]/AT,  NOSE-clear drainage  THROAT-clear, no lesions, no postnasal drip or exudate noted.   NECK:  Supple w/ fair ROM; no JVD; normal carotid impulses w/o bruits; no thyromegaly or nodules palpated; no lymphadenopathy.    RESP  Clear  P & A; w/o, wheezes/ rales/ or rhonchi. no accessory muscle use, no dullness to percussion  CARD:  RRR, no m/r/g, no peripheral edema, pulses intact, no cyanosis or clubbing.  GI:   Soft & nt; nml bowel sounds; no organomegaly or masses detected.   Musco: Warm bil, no deformities or joint swelling noted.   Neuro: alert, no focal deficits noted.    Skin: Warm, no lesions or rashes    Lab Results:  CBC    Component Value Date/Time   WBC 5.9 02/23/2021 0151   RBC 3.26 (L) 02/23/2021 0151   HGB 9.9 (L) 02/23/2021 0151   HGB 12.4 09/02/2018 1211   HCT 26.8 (L) 02/23/2021 0151   HCT 37.3 09/02/2018 1211   PLT 184 02/23/2021 0151   PLT 203 09/02/2018 1211   MCV 82.2 02/23/2021 0151   MCV 87 09/02/2018 1211   MCH 30.4 02/23/2021 0151   MCHC 36.9 (H) 02/23/2021 0151   RDW 12.3 02/23/2021 0151    RDW 13.1 09/02/2018 1211   LYMPHSABS 1.4 02/23/2021 0151   LYMPHSABS 1.2 09/02/2018 1211   MONOABS 0.6 02/23/2021 0151   EOSABS 0.2 02/23/2021 0151   EOSABS 0.1 09/02/2018 1211   BASOSABS 0.0 02/23/2021 0151   BASOSABS 0.0 09/02/2018 1211    BMET    Component Value Date/Time   NA 133 (L) 02/23/2021 0151   NA 132 (L) 01/04/2021 1153   K 3.6 02/23/2021 0151   CL 99 02/23/2021 0151   CO2 23 02/23/2021 0151   GLUCOSE 111 (H)  02/23/2021 0151   BUN 48 (H) 02/23/2021 0151   BUN 41 (H) 01/04/2021 1153   CREATININE 3.58 (H) 02/23/2021 0151   CREATININE 1.52 (H) 06/27/2016 1152   CALCIUM 8.7 (L) 02/23/2021 0151   GFRNONAA 13 (L) 02/23/2021 0151   GFRNONAA 35 (L) 06/27/2016 1152   GFRAA 22 (L) 09/14/2019 0912   GFRAA 40 (L) 06/27/2016 1152    BNP No results found for: BNP  ProBNP    Component Value Date/Time   PROBNP 27.5 10/10/2011 1329    Imaging: No results found.    PFT Results Latest Ref Rng & Units 12/22/2017  FVC-Pre L 2.28  FVC-Predicted Pre % 107  FVC-Post L 2.26  FVC-Predicted Post % 107  Pre FEV1/FVC % % 82  Post FEV1/FCV % % 86  FEV1-Pre L 1.87  FEV1-Predicted Pre % 114  FEV1-Post L 1.94  DLCO uncorrected ml/min/mmHg 13.45  DLCO UNC% % 62  DLCO corrected ml/min/mmHg 13.76  DLCO COR %Predicted % 63  DLVA Predicted % 82  TLC L 4.81  TLC % Predicted % 101  RV % Predicted % 98    No results found for: NITRICOXIDE      Assessment & Plan:   Upper airway cough syndrome Chronic cough dating back to 1993.  Previous work-up with low IgE.  Absolute eosinophil count in the past around 200.  Allergy profile negative.  Has been tried on multiple regimens in the past including gabapentin without significant improvement. Pulmonary function testing showed no airflow obstruction or restriction.  She did have a decreased diffusing capacity. Will place patient on trigger prevention and cough control.  Check high-resolution CT chest to rule out underlying  interstitial process  Plan   Patient Instructions  Begin Allegra '60mg'$  daily .  Saline nasal rinses Twice daily   Use Chlorpheniramine  '4mg'$  1 tabs At bedtime  As needed  For drainage.  Robitussin DM 2 tsp every 4hr for cough As needed   Albuterol inhaler 1-2 puffs every 6hr as needed .  Set up HRCT Chest  Follow up with ENT .  Follow up in 6 weeks with Dr. Lamonte Sakai  and As needed   Please contact office for sooner follow up if symptoms do not improve or worsen or seek emergency care      Seasonal and perennial allergic rhinitis Add Allegra and chlorpheniramine   Plan  Patient Instructions  Begin Allegra '60mg'$  daily .  Saline nasal rinses Twice daily   Use Chlorpheniramine  '4mg'$  1 tabs At bedtime  As needed  For drainage.  Robitussin DM 2 tsp every 4hr for cough As needed   Albuterol inhaler 1-2 puffs every 6hr as needed .  Set up HRCT Chest  Follow up with ENT .  Follow up in 6 weeks with Dr. Lamonte Sakai  and As needed   Please contact office for sooner follow up if symptoms do not improve or worsen or seek emergency care         Rexene Edison, NP 05/07/2021

## 2021-05-07 NOTE — Addendum Note (Signed)
Addended by: Fran Lowes on: 05/07/2021 02:43 PM   Modules accepted: Orders

## 2021-05-07 NOTE — Assessment & Plan Note (Signed)
Add Allegra and chlorpheniramine   Plan  Patient Instructions  Begin Allegra '60mg'$  daily .  Saline nasal rinses Twice daily   Use Chlorpheniramine  '4mg'$  1 tabs At bedtime  As needed  For drainage.  Robitussin DM 2 tsp every 4hr for cough As needed   Albuterol inhaler 1-2 puffs every 6hr as needed .  Set up HRCT Chest  Follow up with ENT .  Follow up in 6 weeks with Dr. Lamonte Sakai  and As needed   Please contact office for sooner follow up if symptoms do not improve or worsen or seek emergency care

## 2021-05-07 NOTE — Patient Instructions (Addendum)
Begin Allegra '60mg'$  daily .  Saline nasal rinses Twice daily   Use Chlorpheniramine  '4mg'$  1 tabs At bedtime  As needed  For drainage.  Robitussin DM 2 tsp every 4hr for cough As needed   Albuterol inhaler 1-2 puffs every 6hr as needed .  Set up HRCT Chest  Follow up with ENT .  Follow up in 6 weeks with Dr. Lamonte Sakai  and As needed   Please contact office for sooner follow up if symptoms do not improve or worsen or seek emergency care

## 2021-05-07 NOTE — Assessment & Plan Note (Signed)
Chronic cough dating back to 1993.  Previous work-up with low IgE.  Absolute eosinophil count in the past around 200.  Allergy profile negative.  Has been tried on multiple regimens in the past including gabapentin without significant improvement. Pulmonary function testing showed no airflow obstruction or restriction.  She did have a decreased diffusing capacity. Will place patient on trigger prevention and cough control.  Check high-resolution CT chest to rule out underlying interstitial process  Plan   Patient Instructions  Begin Allegra '60mg'$  daily .  Saline nasal rinses Twice daily   Use Chlorpheniramine  '4mg'$  1 tabs At bedtime  As needed  For drainage.  Robitussin DM 2 tsp every 4hr for cough As needed   Albuterol inhaler 1-2 puffs every 6hr as needed .  Set up HRCT Chest  Follow up with ENT .  Follow up in 6 weeks with Dr. Lamonte Sakai  and As needed   Please contact office for sooner follow up if symptoms do not improve or worsen or seek emergency care

## 2021-05-09 ENCOUNTER — Other Ambulatory Visit: Payer: Self-pay

## 2021-05-22 ENCOUNTER — Telehealth: Payer: Self-pay | Admitting: Family Medicine

## 2021-05-22 ENCOUNTER — Telehealth: Payer: Self-pay | Admitting: *Deleted

## 2021-05-22 DIAGNOSIS — E1122 Type 2 diabetes mellitus with diabetic chronic kidney disease: Secondary | ICD-10-CM

## 2021-05-22 DIAGNOSIS — I129 Hypertensive chronic kidney disease with stage 1 through stage 4 chronic kidney disease, or unspecified chronic kidney disease: Secondary | ICD-10-CM

## 2021-05-22 NOTE — Telephone Encounter (Signed)
Routing to PCP for review.

## 2021-05-22 NOTE — Telephone Encounter (Signed)
Copied from [place] [id number]. Topic: Referral - Request for Referral >> May 14, 2021  4:09 PM Tessa Lerner A wrote: Has patient seen PCP for this complaint? Yes.    *If NO, is insurance requiring patient see PCP for this issue before PCP can refer them?  Referral for which specialty: Nephrology   Preferred provider/office: [facility]   Reason for referral: Patient needs a new referral to be seen for continuing concerns (stage 4 renal issues)

## 2021-05-22 NOTE — Telephone Encounter (Signed)
Copied from [place] [id number]. Topic: Referral - Request for Referral >> May 13, 2021  1:53 PM Pawlus, Brayton Layman A wrote: Pt called in requested a referral be sent in to [facility], pt stated her referral expired and a new one needs to be sent. Please advise. >> May 14, 2021  1:02 PM Yvette Rack wrote: Pt stated she needs the referral to [facility] today. Pt requests call back at [phone]

## 2021-05-22 NOTE — Telephone Encounter (Signed)
Copied from [place] [id number]. Topic: General - Other >> May 14, 2021  4:10 PM Tessa Lerner A wrote: Reason for CRM: The patient has been in contact with [facility]  The patient has been previously referred to this practice ( Referral [phone]) and was told by staff that their referral has expired for the year and a new one is needed for them to be scheduled to be seen at the facility  The patient has stressed the urgency of the matter due to them being stage 4  Please contact further when possible

## 2021-05-23 NOTE — Addendum Note (Signed)
Addended by: Charlott Rakes on: 05/23/2021 08:59 AM   Modules accepted: Orders

## 2021-05-23 NOTE — Telephone Encounter (Signed)
Referral has placed and patient s been informed

## 2021-05-23 NOTE — Telephone Encounter (Signed)
Referral has been placed. 

## 2021-05-24 ENCOUNTER — Ambulatory Visit
Admission: RE | Admit: 2021-05-24 | Discharge: 2021-05-24 | Disposition: A | Discharge status: Home / Self Care | Payer: Medicare Other | Source: Ambulatory Visit | Attending: Adult Health | Admitting: Adult Health

## 2021-05-24 DIAGNOSIS — J849 Interstitial pulmonary disease, unspecified: Secondary | ICD-10-CM

## 2021-05-28 ENCOUNTER — Other Ambulatory Visit: Payer: Self-pay

## 2021-05-28 ENCOUNTER — Other Ambulatory Visit: Payer: Self-pay | Admitting: Family Medicine

## 2021-05-28 DIAGNOSIS — I10 Essential (primary) hypertension: Secondary | ICD-10-CM

## 2021-05-28 MED ORDER — ISOSORBIDE MONONITRATE ER 60 MG PO TB24
ORAL_TABLET | Freq: Every day | ORAL | 0 refills | Status: DC
  Filled 2021-05-28 – 2021-05-31 (×2): qty 90, 90d supply, fill #0

## 2021-05-28 NOTE — Telephone Encounter (Signed)
Requested Prescriptions  Pending Prescriptions Disp Refills  . isosorbide mononitrate (IMDUR) 60 MG 24 hr tablet 90 tablet 0    Sig: TAKE 1 TABLET (60 MG TOTAL) BY MOUTH DAILY.     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   [place], Zayante, MD   3 months ago Encounter for medication review   [place], Annie Main L, RPH-CPP   4 months ago Controlled type 2 diabetes mellitus with chronic kidney disease, without long-term current use of insulin, unspecified CKD stage [facility])   [facility] Ladell Pier, MD   1 year ago Encounter for screening mammogram for malignant neoplasm of breast   [place], Charlane Ferretti, MD   1 year ago Type 2 diabetes mellitus with stage 3 chronic kidney disease, without long-term current use of insulin ([place])   [place], Enobong, MD      Future Appointments            In 1 week Charlott Rakes, MD [place]   In 3 weeks [place], Rose Fillers, MD [facility]

## 2021-05-30 ENCOUNTER — Other Ambulatory Visit: Payer: Self-pay

## 2021-05-31 ENCOUNTER — Other Ambulatory Visit: Payer: Self-pay

## 2021-06-03 ENCOUNTER — Other Ambulatory Visit: Payer: Self-pay

## 2021-06-06 ENCOUNTER — Other Ambulatory Visit: Payer: Self-pay

## 2021-06-06 ENCOUNTER — Ambulatory Visit: Payer: Medicare Other | Attending: Family Medicine | Admitting: Family Medicine

## 2021-06-06 ENCOUNTER — Other Ambulatory Visit: Payer: Self-pay | Admitting: Family Medicine

## 2021-06-06 ENCOUNTER — Encounter: Payer: Self-pay | Admitting: Family Medicine

## 2021-06-06 VITALS — BP 145/73 | HR 65 | Ht 62.0 in | Wt 143.6 lb

## 2021-06-06 DIAGNOSIS — I129 Hypertensive chronic kidney disease with stage 1 through stage 4 chronic kidney disease, or unspecified chronic kidney disease: Secondary | ICD-10-CM

## 2021-06-06 DIAGNOSIS — E1122 Type 2 diabetes mellitus with diabetic chronic kidney disease: Secondary | ICD-10-CM | POA: Diagnosis not present

## 2021-06-06 DIAGNOSIS — G4709 Other insomnia: Secondary | ICD-10-CM

## 2021-06-06 DIAGNOSIS — R519 Headache, unspecified: Secondary | ICD-10-CM | POA: Diagnosis not present

## 2021-06-06 DIAGNOSIS — E1149 Type 2 diabetes mellitus with other diabetic neurological complication: Secondary | ICD-10-CM

## 2021-06-06 DIAGNOSIS — Z23 Encounter for immunization: Secondary | ICD-10-CM

## 2021-06-06 DIAGNOSIS — I651 Occlusion and stenosis of basilar artery: Secondary | ICD-10-CM

## 2021-06-06 DIAGNOSIS — N184 Chronic kidney disease, stage 4 (severe): Secondary | ICD-10-CM

## 2021-06-06 DIAGNOSIS — I10 Essential (primary) hypertension: Secondary | ICD-10-CM

## 2021-06-06 LAB — POCT GLYCOSYLATED HEMOGLOBIN (HGB A1C): HbA1c, POC (controlled diabetic range): 6.9 % (ref 0.0–7.0)

## 2021-06-06 MED ORDER — GLIPIZIDE 5 MG PO TABS
2.5000 mg | ORAL_TABLET | Freq: Every day | ORAL | 3 refills | Status: DC
  Filled 2021-06-06 – 2021-09-26 (×2): qty 30, 60d supply, fill #0

## 2021-06-06 MED ORDER — ACCU-CHEK GUIDE VI STRP
ORAL_STRIP | 12 refills | Status: DC
  Filled 2021-06-06 – 2021-06-13 (×2): qty 100, 33d supply, fill #0
  Filled 2021-06-14: qty 100, 25d supply, fill #0

## 2021-06-06 MED ORDER — HYDRALAZINE HCL 25 MG PO TABS
25.0000 mg | ORAL_TABLET | Freq: Three times a day (TID) | ORAL | 0 refills | Status: DC
  Filled 2021-06-06: qty 90, 30d supply, fill #0

## 2021-06-06 MED ORDER — AMLODIPINE BESYLATE 10 MG PO TABS
10.0000 mg | ORAL_TABLET | Freq: Every day | ORAL | 1 refills | Status: DC
  Filled 2021-06-06: qty 90, 90d supply, fill #0

## 2021-06-06 NOTE — Patient Instructions (Signed)
[employer] [phone]

## 2021-06-06 NOTE — Progress Notes (Signed)
Needs new referral to kidney doctor. Not sleeping Having headaches. PCS form.

## 2021-06-06 NOTE — Progress Notes (Signed)
Subjective:  Patient ID: Paula [name], female    DOB: 1945-12-05  Age: 75 y.o. MRN: [id number]  CC: Diabetes   HPI Paula [name] is a 75 y.o. 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  Interval History: She is having headaches at the moment on the top of her head which she experiences especially when it is cold. Also states she has it when her sugars are high. Her blood sugars have been elevated up to 270 - 280 and she resumed her Glipizide which had been previously discontinued to prevent hypoglycemia. A1c is 6.9 up from 5.8 previously.  She is requesting a second Vascular opinion as she was informed she needed a [place] -A-Cath due to the fact that she has small veins for A-V fistula and she would not like that. She is being worked up for Hemodialysis. She plans on returning back to [name] as she has no one to care for her here in the [place] and her daughter goes to work. She also complains of insomnia. She is home alone and takes a lot of daytime naps. She would like  meals on wheels and also PCS services.   Past Medical History:  Diagnosis Date   Allergic rhinitis    Arthritis    Asthma    Brain aneurysm    Chronic kidney disease    Stage 3   Cough    Diabetes mellitus    Diastolic CHF, chronic ([place]) 10/11/2011   GERD (gastroesophageal reflux disease)    History of colon polyps 2012   tubular adenoma    Hyperlipidemia    Hypertension    Neuropathy 10/11/2011   PONV (postoperative nausea and vomiting)    one time after lymph node surgery    Past Surgical History:  Procedure Laterality Date   CATARACT EXTRACTION     right eye   IR 3D INDEPENDENT WKST   12/14/2017   IR ANGIO INTRA EXTRACRAN SEL COM CAROTID INNOMINATE BILAT MOD SED  09/15/2017   IR ANGIO INTRA EXTRACRAN SEL COM CAROTID INNOMINATE UNI L MOD SED  12/14/2017   IR ANGIO VERTEBRAL SEL VERTEBRAL BILAT MOD SED  09/15/2017   IR RADIOLOGIST EVAL & MGMT  09/10/2017   IR RADIOLOGIST EVAL & MGMT  10/19/2017   lymphatic mass surgery     lymphoma mass surgery Bilateral    non cancerous   NASAL TURBINATE REDUCTION     RADIOLOGY WITH ANESTHESIA N/A 12/14/2017   Procedure: RADIOLOGY WITH ANESTHESIA EMBOLIZATION;  Surgeon: Luanne Bras, MD;  Location: [facility];  Service: Radiology;  Laterality: N/A;   VIDEO BRONCHOSCOPY Bilateral 11/18/2017   Procedure: VIDEO BRONCHOSCOPY WITHOUT FLUORO;  Surgeon: Collene Gobble, MD;  Location: [facility];  Service: Cardiopulmonary;  Laterality: Bilateral;    Family History  Problem Relation Age of Onset   Hypertension Mother    Asthma Sister    Colon cancer Neg Hx    Allergic rhinitis Neg Hx    Eczema Neg Hx     Allergies  Allergen Reactions   Dust Mite Extract Cough   Ace Inhibitors Cough   Lovastatin Other (See Comments)    Generalized body pain    Outpatient Medications Prior to Visit  Medication Sig Dispense Refill  albuterol (VENTOLIN HFA) 108 (90 Base) MCG/ACT inhaler Inhale 1-2 puffs into the lungs every 6 (six) hours as needed. 8.5 g 2   amLODipine (NORVASC) 10 MG tablet Take 1 tablet (10 mg total) by mouth daily. 90 tablet 1   aspirin EC 81 MG tablet Take 81 mg by mouth every evening.     atorvastatin (LIPITOR) 80 MG tablet Take 1 tablet (80 mg total) by mouth daily. 90 tablet 1   carvedilol (COREG) 12.5 MG tablet Take 1 tablet (12.5 mg total) by mouth 2 (two) times daily with a meal. 180 tablet 1   DULoxetine (CYMBALTA) 30 MG capsule Take 1 capsule (30 mg total) by mouth daily. For diabetic neuropathy 90 capsule 1   isosorbide mononitrate (IMDUR) 60 MG 24 hr tablet TAKE 1 TABLET (60 MG TOTAL) BY MOUTH DAILY. 90 tablet 0   loratadine  (CLARITIN) 10 MG tablet Take 1 tablet (10 mg total) by mouth daily. 30 tablet 11   Misc. Devices [employer].  Diagnosis - unstable gait 1 each 0   fluticasone (FLONASE) 50 MCG/ACT nasal spray Place 2 sprays into both nostrils daily. (Patient not taking: No sig reported) 16 g 2   hydrALAZINE (APRESOLINE) 25 MG tablet Take 1 tablet (25 mg total) by mouth every 8 (eight) hours. 90 tablet 0   No facility-administered medications prior to visit.     ROS Review of Systems  Constitutional:  Negative for activity change, appetite change and fatigue.  HENT:  Negative for congestion, sinus pressure and sore throat.   Eyes:  Negative for visual disturbance.  Respiratory:  Negative for cough, chest tightness, shortness of breath and wheezing.   Cardiovascular:  Negative for chest pain and palpitations.  Gastrointestinal:  Negative for abdominal distention, abdominal pain and constipation.  Endocrine: Negative for polydipsia.  Genitourinary:  Negative for dysuria and frequency.  Musculoskeletal:  Negative for arthralgias and back pain.  Skin:  Negative for rash.  Neurological:  Positive for headaches. Negative for tremors, light-headedness and numbness.  Hematological:  Does not bruise/bleed easily.  Psychiatric/Behavioral:  Positive for sleep disturbance. Negative for agitation and behavioral problems.    Objective:  BP (!) 145/73   Pulse 65   Ht 5\' 2"  (1.575 m)   Wt 143 lb 9.6 oz (65.1 kg)   LMP  (LMP Unknown)   SpO2 100%   BMI 26.26 kg/m   BP/Weight 06/06/2021 11/06/9240 01/16/3418  Systolic BP 622 297 989  Diastolic BP 73 66 58  Wt. (Lbs) 143.6 145.6 139.8  BMI 26.26 26.63 25.57      Physical Exam Constitutional:      Appearance: She is well-developed.  Cardiovascular:     Rate and Rhythm: Normal rate.     Heart sounds: Normal heart sounds. No murmur heard. Pulmonary:     Effort: Pulmonary effort is normal.     Breath sounds: Normal breath sounds. No wheezing or rales.  Chest:      Chest wall: No tenderness.  Abdominal:     General: Bowel sounds are normal. There is no distension.     Palpations: Abdomen is soft. There is no mass.     Tenderness: There is no abdominal tenderness.  Musculoskeletal:        General: Normal range of motion.     Right lower leg: No edema.     Left lower leg: No edema.  Neurological:     Mental Status: She is alert and oriented to person, place, and time.  Psychiatric:        Mood and Affect: Mood normal.    CMP Latest Ref Rng & Units 02/23/2021 02/22/2021 02/21/2021  Glucose 70 - 99 mg/dL 111(H) 157(H) 141(H)  BUN 8 - 23 mg/dL 48(H) 47(H) 46(H)  Creatinine 0.44 - 1.00 mg/dL 3.58(H) 3.56(H) 4.00(H)  Sodium 135 - 145 mmol/L 133(L) 134(L) 129(L)  Potassium 3.5 - 5.1 mmol/L 3.6 4.0 3.6  Chloride 98 - 111 mmol/L 99 101 92(L)  CO2 22 - 32 mmol/L 23 27 -  Calcium 8.9 - 10.3 mg/dL 8.7(L) 8.6(L) -  Total Protein 6.5 - 8.1 g/dL 6.0(L) - -  Total Bilirubin 0.3 - 1.2 mg/dL 0.6 - -  Alkaline Phos 38 - 126 U/L 59 - -  AST 15 - 41 U/L 16 - -  ALT 0 - 44 U/L 14 - -    Lipid Panel     Component Value Date/Time   CHOL 141 01/04/2021 1153   TRIG 78 01/04/2021 1153   HDL 63 01/04/2021 1153   CHOLHDL 2.2 01/04/2021 1153   CHOLHDL 2.9 01/25/2016 0901   VLDL 20 01/25/2016 0901   LDLCALC 63 01/04/2021 1153    CBC    Component Value Date/Time   WBC 5.9 02/23/2021 0151   RBC 3.26 (L) 02/23/2021 0151   HGB 9.9 (L) 02/23/2021 0151   HGB 12.4 09/02/2018 1211   HCT 26.8 (L) 02/23/2021 0151   HCT 37.3 09/02/2018 1211   PLT 184 02/23/2021 0151   PLT 203 09/02/2018 1211   MCV 82.2 02/23/2021 0151   MCV 87 09/02/2018 1211   MCH 30.4 02/23/2021 0151   MCHC 36.9 (H) 02/23/2021 0151   RDW 12.3 02/23/2021 0151   RDW 13.1 09/02/2018 1211   LYMPHSABS 1.4 02/23/2021 0151   LYMPHSABS 1.2 09/02/2018 1211   MONOABS 0.6 02/23/2021 0151   EOSABS 0.2 02/23/2021 0151   EOSABS 0.1 09/02/2018 1211   BASOSABS 0.0 02/23/2021 0151   BASOSABS 0.0  09/02/2018 1211    Lab Results  Component Value Date   HGBA1C 6.9 06/06/2021    Assessment & Plan:  1. Type 2 diabetes mellitus with other neurologic complication, without long-term current use of insulin (HCC) Controlled with A1c of 6.9 up from 5.8 previously Due to blood sugars running high I have prescribed Glipizide at 2.5mg  which she can take when glucose is elevated. Will hold off on daily dosing to prevent hypoglycemia Counseled on Diabetic diet, my plate method, 671 minutes of moderate intensity exercise/week Blood sugar logs with fasting goals of 80-120 mg/dl, random of less than 180 and in the event of sugars less than 60 mg/dl or greater than 400 mg/dl encouraged to notify the clinic. Advised on the need for annual eye exams, annual foot exams, Pneumonia vaccine.  - POCT glycosylated hemoglobin (Hb A1C) - glipiZIDE (GLUCOTROL) 5 MG tablet; Take 0.5 tablets (2.5 mg total) by mouth daily before breakfast.  Dispense: 30 tablet; Refill: 3 - glucose blood (ACCU-CHEK GUIDE) test strip; Use as instructed 3 times daily  Dispense: 100 each; Refill: 12  2. Hypertension in stage 4 chronic kidney disease due to type 2 diabetes mellitus ([place]) Slightly elevated above goal No regimen changes today Continue current medications Referral to [facility] placed per request Provided number to set up meals on wheels Will complete form for PCS services Counseled on blood pressure goal of less than 130/80, low-sodium, DASH diet, medication compliance, 150 minutes of moderate intensity exercise per week. Discussed medication compliance, adverse effects. -  Ambulatory referral to Vascular Surgery - amLODipine (NORVASC) 10 MG tablet; Take 1 tablet (10 mg total) by mouth daily.  Dispense: 90 tablet; Refill: 1 - hydrALAZINE (APRESOLINE) 25 MG tablet; Take 1 tablet (25 mg total) by mouth every 8 (eight) hours.  Dispense: 90 tablet; Refill: 0  3. Other insomnia She take daytime naps and has been  advised to avoid these Discussed sleep hygiene  4. Nonintractable headache, unspecified chronicity pattern, unspecified headache type Possibly tension Tylenol has been effective per patient  5. Need for immunization against influenza  - Flu Vaccine QUAD 39mo+IM (Fluarix, Fluzone & Alfiuria Quad PF)    No orders of the defined types were placed in this encounter.   Follow-up: No follow-ups on file.       Charlott Rakes, MD, FAAFP. [facility] [place] [place], [place]   06/06/2021, 11:52 AM

## 2021-06-07 ENCOUNTER — Other Ambulatory Visit: Payer: Self-pay

## 2021-06-07 NOTE — Telephone Encounter (Signed)
[facility] called and spoke to [place], Merchant navy officer about the refill(s) Imdur requested. Advised it was sent on 05/28/21 #90/0 refill(s). She said it was received and the patient picked it up, so she's cancelling the request. Will refuse due to this.   Requested Prescriptions  Pending Prescriptions Disp Refills   isosorbide mononitrate (IMDUR) 60 MG 24 hr tablet 90 tablet 0    Sig: TAKE 1 TABLET (60 MG TOTAL) BY MOUTH DAILY.     Cardiovascular:  Nitrates Passed - 06/06/2021 10:44 AM      Passed - Last BP in normal range    BP Readings from Last 1 Encounters:  06/06/21 (!) 145/73          Passed - Last Heart Rate in normal range    Pulse Readings from Last 1 Encounters:  06/06/21 65          Passed - Valid encounter within last 12 months    Recent Outpatient Visits           Yesterday Type 2 diabetes mellitus with other neurologic complication, without long-term current use of insulin (HCC)   [place], Enobong, MD   3 months ago Basilar artery stenosis   [place], Charlane Ferretti, MD   3 months ago Encounter for medication review   [place], Jarome Matin, RPH-CPP   5 months ago Controlled type 2 diabetes mellitus with chronic kidney disease, without long-term current use of insulin, unspecified CKD stage [facility])   [facility] Ladell Pier, MD   1 year ago Encounter for screening mammogram for malignant neoplasm of breast   [place], Enobong, MD       Future Appointments             In 1 week Byrum, Rose Fillers, MD [facility]   In 2 months Charlott Rakes, MD [place]

## 2021-06-11 ENCOUNTER — Other Ambulatory Visit: Payer: Medicare Other

## 2021-06-11 ENCOUNTER — Other Ambulatory Visit: Payer: Self-pay

## 2021-06-13 ENCOUNTER — Other Ambulatory Visit: Payer: Self-pay

## 2021-06-13 ENCOUNTER — Telehealth: Payer: Self-pay | Admitting: Family Medicine

## 2021-06-13 NOTE — Telephone Encounter (Unsigned)
Copied from [place] [id number]. Topic: General - Other >> Jun 13, 2021  2:15 PM Paula [name] A wrote: Reason for CRM: The patient would like their cholesterol checked and has called to request a lab order  The patient has scheduled their lab visit for 06/17/21 at 9:20  Please contact further if needed

## 2021-06-13 NOTE — Telephone Encounter (Signed)
Routing to PCP for review.  Ok to place orders?

## 2021-06-14 ENCOUNTER — Other Ambulatory Visit: Payer: Self-pay

## 2021-06-14 ENCOUNTER — Other Ambulatory Visit: Payer: Self-pay | Admitting: Family Medicine

## 2021-06-14 DIAGNOSIS — E1149 Type 2 diabetes mellitus with other diabetic neurological complication: Secondary | ICD-10-CM

## 2021-06-14 NOTE — Telephone Encounter (Signed)
Ok to place

## 2021-06-14 NOTE — Telephone Encounter (Signed)
Medication Refill - Medication:  glucose blood (ACCU-CHEK GUIDE) test strip   Has the patient contacted their pharmacy? Yes.   Was originally sent to [facility] but they told her they were out and she is requesting to have another script sent over to the [facility].   Preferred Pharmacy (with phone number or street name):  [facility] ([place]), [place] - [address] Phone:  [phone]  Fax:  [phone]      Has the patient been seen for an appointment in the last year OR does the patient have an upcoming appointment? Yes.    Agent: Please be advised that RX refills may take up to 3 business days. We ask that you follow-up with your pharmacy.

## 2021-06-15 MED ORDER — ACCU-CHEK GUIDE VI STRP
ORAL_STRIP | 12 refills | Status: DC
Start: 2021-06-15 — End: 2021-07-02

## 2021-06-15 NOTE — Telephone Encounter (Signed)
Pt requests change of pharmacy  Requested Prescriptions  Pending Prescriptions Disp Refills  . glucose blood (ACCU-CHEK GUIDE) test strip 100 each 12    Sig: Use as instructed 3 times daily     Endocrinology: Diabetes - Testing Supplies Passed - 06/14/2021  6:38 PM      Passed - Valid encounter within last 12 months    Recent Outpatient Visits          1 week ago Type 2 diabetes mellitus with other neurologic complication, without long-term current use of insulin ([place])   [place], Enobong, MD   3 months ago Basilar artery stenosis   [place], Charlane Ferretti, MD   3 months ago Encounter for medication review   [place], Jarome Matin, RPH-CPP   5 months ago Controlled type 2 diabetes mellitus with chronic kidney disease, without long-term current use of insulin, unspecified CKD stage [facility])   [facility] Ladell Pier, MD   1 year ago Encounter for screening mammogram for malignant neoplasm of breast   [place], MD      Future Appointments            In 3 days Byrum, Rose Fillers, MD [facility]   In 2 months Charlott Rakes, MD [place]

## 2021-06-17 ENCOUNTER — Other Ambulatory Visit: Payer: Self-pay

## 2021-06-17 ENCOUNTER — Ambulatory Visit: Payer: Medicare Other | Attending: Family Medicine

## 2021-06-17 DIAGNOSIS — E785 Hyperlipidemia, unspecified: Secondary | ICD-10-CM

## 2021-06-17 NOTE — Telephone Encounter (Signed)
Order has been placed.

## 2021-06-18 ENCOUNTER — Ambulatory Visit: Payer: Medicare Other | Admitting: Emergency Medicine

## 2021-06-18 LAB — LIPID PANEL
Chol/HDL Ratio: 2.6 ratio (ref 0.0–4.4)
Cholesterol, Total: 121 mg/dL (ref 100–199)
HDL: 46 mg/dL (ref >39)
LDL Chol Calc (NIH): 61 mg/dL (ref 0–99)
Triglycerides: 67 mg/dL (ref 0–149)
VLDL Cholesterol Cal: 14 mg/dL (ref 5–40)

## 2021-06-20 ENCOUNTER — Telehealth (INDEPENDENT_AMBULATORY_CARE_PROVIDER_SITE_OTHER): Payer: Self-pay

## 2021-06-20 NOTE — Telephone Encounter (Signed)
Copied from [place] [id number]. Topic: General - Other >> Jun 18, 2021  3:17 PM Leward Quan A wrote: Reason for CRM: Patient called in to speak to Dr Margarita Rana say that she have concerns about the findings in the scan that she had last week say that there was a cyst discovered on her kidney and plaque in her arteries and she need to know what Dr Margarita Rana will be doing about this. Patient is asking for immediate action from Dr Margarita Rana can be reached at  Ph# [phone]

## 2021-06-21 ENCOUNTER — Telehealth: Payer: Self-pay

## 2021-06-21 NOTE — Telephone Encounter (Signed)
-----   Message from Charlott Rakes, MD sent at 06/18/2021 12:35 PM EST ----- Please inform the patient that labs are normal. Thank you.

## 2021-06-21 NOTE — Telephone Encounter (Signed)
Pt was called and informed of normal lipid panel results.

## 2021-06-21 NOTE — Telephone Encounter (Signed)
Pt was called and a VM was left informing patient of lab results.   CRM created and letter has been mailed.

## 2021-06-21 NOTE — Telephone Encounter (Signed)
Pt is calling to speak to the nurse about the concerns with her findings please advise CB- [phone]

## 2021-06-24 ENCOUNTER — Other Ambulatory Visit: Payer: Self-pay

## 2021-06-24 ENCOUNTER — Telehealth: Payer: Self-pay | Admitting: Primary Care

## 2021-06-24 ENCOUNTER — Encounter: Payer: Self-pay | Admitting: Primary Care

## 2021-06-24 ENCOUNTER — Ambulatory Visit (INDEPENDENT_AMBULATORY_CARE_PROVIDER_SITE_OTHER): Payer: Medicare Other | Admitting: Primary Care

## 2021-06-24 VITALS — BP 120/68 | HR 64 | Temp 98.3°F | Ht 62.0 in | Wt 144.6 lb

## 2021-06-24 DIAGNOSIS — I700 Atherosclerosis of aorta: Secondary | ICD-10-CM

## 2021-06-24 DIAGNOSIS — I651 Occlusion and stenosis of basilar artery: Secondary | ICD-10-CM

## 2021-06-24 DIAGNOSIS — R053 Chronic cough: Secondary | ICD-10-CM | POA: Diagnosis not present

## 2021-06-24 HISTORY — DX: Atherosclerosis of aorta: I70.0

## 2021-06-24 NOTE — Patient Instructions (Addendum)
HRCT in October 2022 showed shows clear lungs.  No identifiable cause for her chronic cough.  No sign of interstitial lung disease  PFT 12/2017 normal lung function  Recommendations: Needs to establish with ENT Continue Flonase nasal spray daily Try Chlorpheniramine 4mg  tablet every 4 hours for cough (over the counter)  Referral: Cardiology re: aortic atherosclerosis/ chest pain (ordered)  Follow-up: 6 months with Dr. Lamonte Sakai

## 2021-06-24 NOTE — Progress Notes (Signed)
@Patient  ID: Paula [name], female    DOB: 1945/10/01, 75 y.o.   MRN: [id number]  Chief Complaint  Patient presents with   Follow-up    Referring provider: Charlott Rakes, MD  HPI: 75 yo female never smoker. PMH significant for allergic rhinitis, chronic cough, chronic kidney disease. Patient of Dr. Lamonte Sakai, last seen by pulmonary NP on 05/07/21.   Previous [facility] pulmonary encounter:  05/07/2021 Follow up : Chronic cough  Patient returns for a 6-week follow-up.  Patient was seen last visit with increased cough.  She was recommended to start Claritin and Flonase.  She was also referred to ENT.  Patient says she was unable to take Claritin and it caused her lower ankles to swell.  Patient complains that she is having ongoing cough that is severe at times.  She has severe coughing paroxysms.  She denies any discolored mucus, chest pain, orthopnea.  Currently not using any cough medicines for her cough. She has been referred to ENT is awaiting appointment. Cough is worse at night.  Upper airway cough syndrome Chronic cough dating back to 1993.  Previous work-up with low IgE.  Absolute eosinophil count in the past around 200.  Allergy profile negative.  Has been tried on multiple regimens in the past including gabapentin without significant improvement. Pulmonary function testing showed no airflow obstruction or restriction.  She did have a decreased diffusing capacity. Will place patient on trigger prevention and cough control.  Check high-resolution CT chest to rule out underlying interstitial process  06/24/2021- interim hx  Patient presents today for 6 week follow-up. She states that her cough is a little bit better than before. She can feel drainage back of throat. Cough can be productive with clear mucus at times. States that she can not take prednisone d/t kidney disease. HRCT in October 2022 showed shows clear lungs.  No identifiable cause for her chronic cough.  No sign of interstitial  lung disease. Denies f/c/s, shortness of breath, chest tightness, wheezing, lower extremity swelling, heart burn/reflux.   TEST/EVENTS :  PFT 12/2017 normal lung function with FEV1 at 119%, ratio 86, FVC 107%, no significant bronchodilator response, DLCO 62%  Chronic cough work-up-bronchoscopy showed upper airway erythema. ENT evaluation unrevealing. Previously tried on gabapentin without significant improvement  CBC with differential February 23, 2021 absolute eosinophil count 200  Allergies  Allergen Reactions   Dust Mite Extract Cough   Ace Inhibitors Cough   Lovastatin Other (See Comments)    Generalized body pain    Immunization History  Administered Date(s) Administered   Influenza Split 05/13/2012, 04/12/2015   Influenza Whole 07/12/2011   Influenza,inj,Quad PF,6+ Mos 05/06/2013, 04/28/2016, 05/07/2017, 06/15/2018, 05/10/2019, 06/06/2021   PFIZER(Purple Top)SARS-COV-2 Vaccination 01/19/2021   Pneumococcal Conjugate-13 05/10/2019   Pneumococcal Polysaccharide-23 10/11/2011    Past Medical History:  Diagnosis Date   Allergic rhinitis    Arthritis    Asthma    Brain aneurysm    Chronic kidney disease    Stage 3   Cough    Diabetes mellitus    Diastolic CHF, chronic ([place]) 10/11/2011   GERD (gastroesophageal reflux disease)    History of colon polyps 2012   tubular adenoma    Hyperlipidemia    Hypertension    Neuropathy 10/11/2011   PONV (postoperative nausea and vomiting)    one time after lymph node surgery    Tobacco History: Social History   Tobacco Use  Smoking Status Never  Smokeless Tobacco Never   Counseling given: Not  Answered   Outpatient Medications Prior to Visit  Medication Sig Dispense Refill   albuterol (VENTOLIN HFA) 108 (90 Base) MCG/ACT inhaler Inhale 1-2 puffs into the lungs every 6 (six) hours as needed. 8.5 g 2   amLODipine (NORVASC) 10 MG tablet Take 1 tablet (10 mg total) by mouth daily. 90 tablet 1   aspirin EC 81 MG tablet Take 81 mg  by mouth every evening.     atorvastatin (LIPITOR) 80 MG tablet Take 1 tablet (80 mg total) by mouth daily. 90 tablet 1   carvedilol (COREG) 12.5 MG tablet Take 1 tablet (12.5 mg total) by mouth 2 (two) times daily with a meal. 180 tablet 1   DULoxetine (CYMBALTA) 30 MG capsule Take 1 capsule (30 mg total) by mouth daily. For diabetic neuropathy 90 capsule 1   glipiZIDE (GLUCOTROL) 5 MG tablet Take 0.5 tablets (2.5 mg total) by mouth daily before breakfast. 30 tablet 3   glucose blood (ACCU-CHEK GUIDE) test strip Use as instructed 3 times daily 100 each 12   hydrALAZINE (APRESOLINE) 25 MG tablet Take 1 tablet (25 mg total) by mouth every 8 (eight) hours. 90 tablet 0   isosorbide mononitrate (IMDUR) 60 MG 24 hr tablet TAKE 1 TABLET (60 MG TOTAL) BY MOUTH DAILY. 90 tablet 0   loratadine (CLARITIN) 10 MG tablet Take 1 tablet (10 mg total) by mouth daily. 30 tablet 11   Misc. Devices [employer].  Diagnosis - unstable gait 1 each 0   fluticasone (FLONASE) 50 MCG/ACT nasal spray Place 2 sprays into both nostrils daily. (Patient not taking: No sig reported) 16 g 2   No facility-administered medications prior to visit.    Review of Systems  Review of Systems  Constitutional: Negative.   HENT:  Positive for postnasal drip.   Respiratory:  Positive for cough. Negative for chest tightness, shortness of breath and wheezing.     Physical Exam  BP 120/68 (BP Location: Left Arm, Patient Position: Sitting, Cuff Size: Normal)   Pulse 64   Temp 98.3 F (36.8 C) (Oral)   Ht 5\' 2"  (1.575 m)   Wt 144 lb 9.6 oz (65.6 kg)   LMP  (LMP Unknown)   SpO2 100%   BMI 26.45 kg/m  Physical Exam Constitutional:      Appearance: Normal appearance.  HENT:     Head: Normocephalic and atraumatic.     Mouth/Throat:     Mouth: Mucous membranes are moist.     Pharynx: Oropharynx is clear.  Cardiovascular:     Rate and Rhythm: Normal rate and regular rhythm.  Pulmonary:     Effort: Pulmonary effort is normal.      Breath sounds: Normal breath sounds. No wheezing, rhonchi or rales.  Neurological:     General: No focal deficit present.     Mental Status: She is alert and oriented to person, place, and time. Mental status is at baseline.  Psychiatric:        Mood and Affect: Mood normal.        Behavior: Behavior normal.        Thought Content: Thought content normal.        Judgment: Judgment normal.     Lab Results:  CBC    Component Value Date/Time   WBC 5.9 02/23/2021 0151   RBC 3.26 (L) 02/23/2021 0151   HGB 9.9 (L) 02/23/2021 0151   HGB 12.4 09/02/2018 1211   HCT 26.8 (L) 02/23/2021 0151   HCT 37.3 09/02/2018  1211   PLT 184 02/23/2021 0151   PLT 203 09/02/2018 1211   MCV 82.2 02/23/2021 0151   MCV 87 09/02/2018 1211   MCH 30.4 02/23/2021 0151   MCHC 36.9 (H) 02/23/2021 0151   RDW 12.3 02/23/2021 0151   RDW 13.1 09/02/2018 1211   LYMPHSABS 1.4 02/23/2021 0151   LYMPHSABS 1.2 09/02/2018 1211   MONOABS 0.6 02/23/2021 0151   EOSABS 0.2 02/23/2021 0151   EOSABS 0.1 09/02/2018 1211   BASOSABS 0.0 02/23/2021 0151   BASOSABS 0.0 09/02/2018 1211    BMET    Component Value Date/Time   NA 133 (L) 02/23/2021 0151   NA 132 (L) 01/04/2021 1153   K 3.6 02/23/2021 0151   CL 99 02/23/2021 0151   CO2 23 02/23/2021 0151   GLUCOSE 111 (H) 02/23/2021 0151   BUN 48 (H) 02/23/2021 0151   BUN 41 (H) 01/04/2021 1153   CREATININE 3.58 (H) 02/23/2021 0151   CREATININE 1.52 (H) 06/27/2016 1152   CALCIUM 8.7 (L) 02/23/2021 0151   GFRNONAA 13 (L) 02/23/2021 0151   GFRNONAA 35 (L) 06/27/2016 1152   GFRAA 22 (L) 09/14/2019 0912   GFRAA 40 (L) 06/27/2016 1152    BNP No results found for: BNP  ProBNP    Component Value Date/Time   PROBNP 27.5 10/10/2011 1329    Imaging: No results found.   Assessment & Plan:   Cough - Chronic cough. She had normal HRCT imaging of her lungs in October 2022 and no evidence of obstructive airways disease on PFTs in 2019. Cough likely related to PND  +/- GERD. Recommend she follow up with ENT. Advised she continue flonase nasal spray and try chlorpheniramine 4mg  q4 hours as needed for cough.   Aortic atherosclerosis ([place]) - Incidental finding on imaging. Refer to cardiology   Martyn Ehrich, NP 06/24/2021

## 2021-06-24 NOTE — Assessment & Plan Note (Addendum)
-   Chronic cough. She had normal HRCT imaging of her lungs in October 2022 and no evidence of obstructive airways disease on PFTs in 2019. Cough likely related to PND +/- GERD. Recommend she follow up with ENT. Advised she continue flonase nasal spray and try chlorpheniramine 4mg  q4 hours as needed for cough.

## 2021-06-24 NOTE — Telephone Encounter (Signed)
Can we follow-up on ENT referral, she medicare/medicaid

## 2021-06-24 NOTE — Assessment & Plan Note (Addendum)
-   Incidental finding on imaging. Refer to cardiology

## 2021-06-25 ENCOUNTER — Telehealth: Payer: Self-pay | Admitting: Primary Care

## 2021-06-25 NOTE — Telephone Encounter (Signed)
I have called the pt and she is aware that CT has been sent to Dr. [name].  Nothing further is needed

## 2021-06-26 ENCOUNTER — Encounter: Payer: Self-pay | Admitting: Cardiology

## 2021-06-26 NOTE — Progress Notes (Signed)
Cardiology Office Note   Date:  06/27/2021   ID:  Paula [name], DOB 11-27-1945, MRN [id number]  PCP:  Charlott Rakes, MD  Cardiologist:   None Referring:  Charlott Rakes, MD  Chief Complaint  Patient presents with   Chest Pain       History of Present Illness: Paula [name] is a 75 y.o. female who was referred by Charlott Rakes, MD for evaluation of aortic atherosclerosis,.  She also has chest pain and elevated coronary calcium    She is referred for a new problem (chest pain.)  Coronary calcium was noted recently when she had a CT.  She has had this done for coughing.  She does have some  She saw Dr. Harrington Challenger in 2020 .  She has had multiple cardiovascular risk factors.  He has had an echo in 2013 with NL EF.  Her most recent echo was in 2022 with a well-preserved ejection fraction.  There was some mild aortic valve sclerosis.  She had severe left ventricular hypertrophy.    She says that she does get chest discomfort.  This happened about 3 days ago.  It happens at night.  She has these episodes episodically.  She gets discomfort under the left breast.  Can be sharp and shooting.  She is somewhat limited by knee pain but she does not bring it on with activity necessarily.  She does live on the second floor.  She is tired when she gets to the top of the second floor but does not necessarily have chest pain.  She does not have palpitations, presyncope or syncope.  She is not describing new PND or orthopnea.  She does have renal insufficiency and is approaching need for dialysis.  She is seeing the vascular surgeons.  Of note I reviewed extensive hospital records.  She was in the hospital in July for hypertensive urgency.  She has some chronic diastolic heart failure.  She did have a mildly elevated troponin.  We did not see her at that time however.  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  She had syncope thought to be  orthostatic.  She did mention chest pain at that time   Past Medical History:  Diagnosis Date   Allergic rhinitis    Arthritis    Asthma    Brain aneurysm    Chronic kidney disease    Cough    Diabetes mellitus    Diastolic CHF, chronic ([place]) 10/11/2011   GERD (gastroesophageal reflux disease)    History of colon polyps 2012   tubular adenoma    Hyperlipidemia    Hypertension    Neuropathy 10/11/2011   PONV (postoperative nausea and vomiting)    one time after lymph node surgery    Past Surgical History:  Procedure Laterality Date   CATARACT EXTRACTION     right eye   IR 3D INDEPENDENT WKST  12/14/2017   IR ANGIO INTRA EXTRACRAN SEL COM CAROTID INNOMINATE BILAT MOD SED  09/15/2017   IR ANGIO INTRA EXTRACRAN SEL COM CAROTID INNOMINATE UNI L MOD SED  12/14/2017   IR ANGIO VERTEBRAL SEL VERTEBRAL BILAT MOD SED  09/15/2017   IR RADIOLOGIST EVAL & MGMT  09/10/2017   IR RADIOLOGIST EVAL & MGMT  10/19/2017   lymphatic mass surgery     NASAL TURBINATE REDUCTION     RADIOLOGY WITH ANESTHESIA N/A 12/14/2017   Procedure: RADIOLOGY WITH ANESTHESIA EMBOLIZATION;  Surgeon:  Luanne Bras, MD;  Location: [facility];  Service: Radiology;  Laterality: N/A;   VIDEO BRONCHOSCOPY Bilateral 11/18/2017   Procedure: VIDEO BRONCHOSCOPY WITHOUT FLUORO;  Surgeon: Collene Gobble, MD;  Location: [facility];  Service: Cardiopulmonary;  Laterality: Bilateral;     Current Outpatient Medications  Medication Sig Dispense Refill   albuterol (VENTOLIN HFA) 108 (90 Base) MCG/ACT inhaler Inhale 1-2 puffs into the lungs every 6 (six) hours as needed. 8.5 g 2   amLODipine (NORVASC) 10 MG tablet Take 1 tablet (10 mg total) by mouth daily. 90 tablet 1   aspirin EC 81 MG tablet Take 81 mg by mouth every evening.     atorvastatin (LIPITOR) 80 MG tablet Take 1 tablet (80 mg total) by mouth daily. 90 tablet 1   carvedilol (COREG) 12.5 MG tablet Take 1 tablet (12.5 mg total) by mouth 2 (two) times daily with a  meal. 180 tablet 1   DULoxetine (CYMBALTA) 30 MG capsule Take 1 capsule (30 mg total) by mouth daily. For diabetic neuropathy 90 capsule 1   fluticasone (FLONASE) 50 MCG/ACT nasal spray Place 2 sprays into both nostrils daily. 16 g 2   glipiZIDE (GLUCOTROL) 5 MG tablet Take 0.5 tablets (2.5 mg total) by mouth daily before breakfast. 30 tablet 3   glucose blood (ACCU-CHEK GUIDE) test strip Use as instructed 3 times daily 100 each 12   hydrALAZINE (APRESOLINE) 25 MG tablet Take 1 tablet (25 mg total) by mouth every 8 (eight) hours. 90 tablet 0   isosorbide mononitrate (IMDUR) 60 MG 24 hr tablet TAKE 1 TABLET (60 MG TOTAL) BY MOUTH DAILY. 90 tablet 0   loratadine (CLARITIN) 10 MG tablet Take 1 tablet (10 mg total) by mouth daily. 30 tablet 11   Misc. Devices [employer].  Diagnosis - unstable gait 1 each 0   No current facility-administered medications for this visit.    Allergies:   Dust mite extract, Ace inhibitors, and Lovastatin   ROS:  Please see the history of present illness.   Otherwise, review of systems are positive for knee pain, sinus problems, constipation.   All other systems are reviewed and negative.    PHYSICAL EXAM: VS:  BP 130/68   Pulse 64   Ht 5\' 3"  (1.6 m)   Wt 145 lb 12.8 oz (66.1 kg)   LMP  (LMP Unknown)   SpO2 99%   BMI 25.83 kg/m  , BMI Body mass index is 25.83 kg/m. GENERAL:  Well appearing HEENT:  Pupils equal round and reactive, fundi not visualized, oral mucosa unremarkable NECK:  No jugular venous distention, waveform within normal limits, carotid upstroke brisk and symmetric, positive bilateral bruits, no thyromegaly LYMPHATICS:  No cervical, inguinal adenopathy LUNGS:  Clear to auscultation bilaterally BACK:  No CVA tenderness CHEST:  Unremarkable HEART:  PMI not displaced or sustained,S1 and S2 within normal limits, no S3, no S4, no clicks, no rubs, no murmurs ABD:  Flat, positive bowel sounds normal in frequency in pitch, no bruits, no rebound, no  guarding, no midline pulsatile mass, no hepatomegaly, no splenomegaly EXT:  2 plus pulses throughout, no edema, no cyanosis no clubbing,  SKIN:  No rashes no nodules NEURO:  Cranial nerves II through XII grossly intact, motor grossly intact throughout PSYCH:  Cognitively intact, oriented to person place and time    EKG:  EKG is ordered today. The ekg ordered today demonstrates normal sinus rhythm, rate 64, poor anterior R wave progression, first-degree AV block, no acute ST-T  wave changes.  No change from previous.   Recent Labs: 02/22/2021: TSH 3.226 02/23/2021: ALT 14; BUN 48; Creatinine, Ser 3.58; Hemoglobin 9.9; Magnesium 2.0; Platelets 184; Potassium 3.6; Sodium 133    Lipid Panel    Component Value Date/Time   CHOL 121 06/17/2021 0904   TRIG 67 06/17/2021 0904   HDL 46 06/17/2021 0904   CHOLHDL 2.6 06/17/2021 0904   CHOLHDL 2.9 01/25/2016 0901   VLDL 20 01/25/2016 0901   LDLCALC 61 06/17/2021 0904      Wt Readings from Last 3 Encounters:  06/27/21 145 lb 12.8 oz (66.1 kg)  06/24/21 144 lb 9.6 oz (65.6 kg)  06/06/21 143 lb 9.6 oz (65.1 kg)      Other studies Reviewed: Additional studies/ records that were reviewed today include:   . Review of the above records demonstrates:  Please see elsewhere in the note.     ASSESSMENT AND PLAN:  CHEST PAIN: She has had some chest discomfort.  She previously had elevated troponins.  She has not documented coronary calcium.  Further testing is indicated but she would not be able to have contrast because of her renal insufficiency.  Therefore, she will have a [employer].  ABNORMAL EKG: Chest first-degree AV block on EKG.  DM: A1c was 6.9.  No change in therapy.  LVH: Patient has significant LVH which likely is related to her hypertension.  I will screen with a PYP scan.  HTN: Her blood pressure now is well controlled.  She can continue the meds as listed.  DYSLIPIDEMIA: LDL was 61 with an HDL of 46.  She can continue  the meds as listed.  Current medicines are reviewed at length with the patient today.  The patient does not have concerns regarding medicines.  The following changes have been made:  no change  Labs/ tests ordered today include:   Orders Placed This Encounter  Procedures   MYOCARDIAL PERFUSION IMAGING   MYOCARDIAL AMYLOID IMAGING PLANAR AND SPECT   EKG 12-Lead      Disposition:   FU with based on the results of the above.   Signed, Minus Breeding, MD  06/27/2021 9:47 AM    [facility]

## 2021-06-27 ENCOUNTER — Ambulatory Visit (INDEPENDENT_AMBULATORY_CARE_PROVIDER_SITE_OTHER): Payer: Medicare Other | Admitting: Cardiology

## 2021-06-27 ENCOUNTER — Encounter: Payer: Self-pay | Admitting: Cardiology

## 2021-06-27 ENCOUNTER — Other Ambulatory Visit: Payer: Self-pay

## 2021-06-27 VITALS — BP 130/68 | HR 64 | Ht 63.0 in | Wt 145.8 lb

## 2021-06-27 DIAGNOSIS — I700 Atherosclerosis of aorta: Secondary | ICD-10-CM | POA: Diagnosis not present

## 2021-06-27 DIAGNOSIS — I5032 Chronic diastolic (congestive) heart failure: Secondary | ICD-10-CM | POA: Diagnosis not present

## 2021-06-27 DIAGNOSIS — R072 Precordial pain: Secondary | ICD-10-CM

## 2021-06-27 DIAGNOSIS — I517 Cardiomegaly: Secondary | ICD-10-CM

## 2021-06-27 DIAGNOSIS — I10 Essential (primary) hypertension: Secondary | ICD-10-CM

## 2021-06-27 DIAGNOSIS — I651 Occlusion and stenosis of basilar artery: Secondary | ICD-10-CM

## 2021-06-27 DIAGNOSIS — E118 Type 2 diabetes mellitus with unspecified complications: Secondary | ICD-10-CM | POA: Diagnosis not present

## 2021-06-27 NOTE — Patient Instructions (Signed)
Medication Instructions:  Your physician recommends that you continue on your current medications as directed. Please refer to the Current Medication list given to you today.  *If you need a refill on your cardiac medications before your next appointment, please call your pharmacy*   Testing/Procedures: Your physician has requested that you have a lexiscan myoview. For further information please visit [email or web address]. Please follow instruction sheet, as given. This will take place at [place], [address]  How to prepare for your Myocardial Perfusion Test: Do not eat or drink 3 hours prior to your test, except you may have water. Do not consume products containing caffeine (regular or decaffeinated) 12 hours prior to your test. (ex: coffee, chocolate, sodas, tea). Do bring a list of your current medications with you.  If not listed below, you may take your medications as normal. Do wear comfortable clothes (no dresses or overalls) and walking shoes, tennis shoes preferred (No heels or open toe shoes are allowed). Do NOT wear cologne, perfume, aftershave, or lotions (deodorant is allowed). The test will take approximately 3 to 4 hours to complete If these instructions are not followed, your test will have to be rescheduled.    Your physician has requested that you have Myocardial Imaging. Please follow instruction sheet, as given.    Follow-Up: At [facility], you and your health needs are our priority.  As part of our continuing mission to provide you with exceptional heart care, we have created designated Provider Care Teams.  These Care Teams include your primary Cardiologist (physician) and Advanced Practice Providers (APPs -  Physician Assistants and Nurse Practitioners) who all work together to provide you with the care you need, when you need it.  We recommend signing up for the patient portal called "MyChart".  Sign up information is provided on this After Visit  Summary.  MyChart is used to connect with patients for Virtual Visits (Telemedicine).  Patients are able to view lab/test results, encounter notes, upcoming appointments, etc.  Non-urgent messages can be sent to your provider as well.   To learn more about what you can do with MyChart, go to [email or web address].    Your next appointment:   We will see you on an as needed basis.   Provider:   Minus Breeding, MD

## 2021-07-02 ENCOUNTER — Other Ambulatory Visit: Payer: Self-pay | Admitting: Family Medicine

## 2021-07-02 DIAGNOSIS — E1149 Type 2 diabetes mellitus with other diabetic neurological complication: Secondary | ICD-10-CM

## 2021-07-02 MED ORDER — ACCU-CHEK GUIDE VI STRP: ORAL_STRIP | 12 refills | Status: DC

## 2021-07-02 NOTE — Telephone Encounter (Signed)
Medication Refill - Medication: Test Strips   Has the patient contacted their pharmacy? Yes.   Pt states that the pharmacy never received this prescription.  Please advise.  (Agent: If no, request that the patient contact the pharmacy for the refill. If patient does not wish to contact the pharmacy document the reason why and proceed with request.) (Agent: If yes, when and what did the pharmacy advise?)  Preferred Pharmacy (with phone number or street name):  [facility], [place] [address]  Phone: [phone] Fax: [phone]  Hours: Not open 24 hours   Has the patient been seen for an appointment in the last year OR does the patient have an upcoming appointment? Yes.    Agent: Please be advised that RX refills may take up to 3 business days. We ask that you follow-up with your pharmacy.

## 2021-07-02 NOTE — Telephone Encounter (Signed)
[facility] called and spoke to [place], [facility] about the refill(s) Glucose Test Strips requested. Advised it was sent on 06/15/21 #100/12 refill(s). He says the insurance is requiring a diagnosis code on the prescription.

## 2021-07-02 NOTE — Telephone Encounter (Signed)
Requested medication (s) are due for refill today: yes  Requested medication (s) are on the active medication list: yes  Last refill:  06/15/21 #100 12   Future visit scheduled: yes in 2 months  Notes to clinic:  please send dx code .     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

## 2021-07-08 ENCOUNTER — Other Ambulatory Visit: Payer: Self-pay

## 2021-07-08 ENCOUNTER — Telehealth: Payer: Self-pay | Admitting: Family Medicine

## 2021-07-08 MED ORDER — TRUE METRIX METER W/DEVICE KIT: PACK | 0 refills | Status: AC

## 2021-07-09 ENCOUNTER — Telehealth: Payer: Self-pay | Admitting: Family Medicine

## 2021-07-09 NOTE — Telephone Encounter (Signed)
Copied from [place] [id number]. Topic: General - Inquiry >> Jul 02, 2021  8:42 AM Oneta Rack wrote: Reason for CRM: patient was last seen 06/06/2021 and states a referral for home health aid was suppose to be placed and she has not heard from agency. Patient also states she has not heard from [employer] and will not have any food for Thanksgiving, Patient would like a follow up call today

## 2021-07-10 ENCOUNTER — Telehealth (HOSPITAL_COMMUNITY): Payer: Self-pay | Admitting: *Deleted

## 2021-07-10 NOTE — Telephone Encounter (Signed)
Close encounter 

## 2021-07-12 ENCOUNTER — Other Ambulatory Visit: Payer: Self-pay

## 2021-07-12 ENCOUNTER — Ambulatory Visit (HOSPITAL_COMMUNITY)
Admission: RE | Admit: 2021-07-12 | Discharge: 2021-07-12 | Disposition: A | Discharge status: Home / Self Care | Payer: Medicare Other | Source: Ambulatory Visit | Attending: Cardiovascular Disease | Admitting: Cardiovascular Disease

## 2021-07-12 DIAGNOSIS — I10 Essential (primary) hypertension: Secondary | ICD-10-CM | POA: Diagnosis present

## 2021-07-12 DIAGNOSIS — E118 Type 2 diabetes mellitus with unspecified complications: Secondary | ICD-10-CM | POA: Insufficient documentation

## 2021-07-12 DIAGNOSIS — I5032 Chronic diastolic (congestive) heart failure: Secondary | ICD-10-CM | POA: Diagnosis present

## 2021-07-12 DIAGNOSIS — I700 Atherosclerosis of aorta: Secondary | ICD-10-CM | POA: Diagnosis present

## 2021-07-12 LAB — MYOCARDIAL PERFUSION IMAGING
Base ST Depression (mm): 0 mm
Nuc Stress EF: 70 %
Peak HR: 68 {beats}/min
Rest HR: 56 {beats}/min
Rest Nuclear Isotope Dose: 10.1 mCi
SDS: 3
SRS: 0
SSS: 3
ST Depression (mm): 0 mm
Stress Nuclear Isotope Dose: 28.3 mCi
TID: 1.17

## 2021-07-12 MED ORDER — REGADENOSON 0.4 MG/5ML IV SOLN
0.4000 mg | Freq: Once | INTRAVENOUS | Status: AC
  Administered 2021-07-12: 0.4 mg via INTRAVENOUS

## 2021-07-12 MED ORDER — TECHNETIUM TC 99M TETROFOSMIN IV KIT
10.1000 | PACK | Freq: Once | INTRAVENOUS | Status: AC | PRN
  Administered 2021-07-12: 10.1 via INTRAVENOUS
  Filled 2021-07-12: qty 11

## 2021-07-12 MED ORDER — AMINOPHYLLINE 25 MG/ML IV SOLN
75.0000 mg | Freq: Once | INTRAVENOUS | Status: AC
  Administered 2021-07-12: 75 mg via INTRAVENOUS

## 2021-07-12 MED ORDER — TECHNETIUM TC 99M TETROFOSMIN IV KIT
28.3000 | PACK | Freq: Once | INTRAVENOUS | Status: AC | PRN
  Administered 2021-07-12: 28.3 via INTRAVENOUS
  Filled 2021-07-12: qty 29

## 2021-07-16 ENCOUNTER — Other Ambulatory Visit: Payer: Self-pay

## 2021-07-17 ENCOUNTER — Other Ambulatory Visit: Payer: Self-pay

## 2021-07-18 ENCOUNTER — Ambulatory Visit: Payer: Medicare Other | Admitting: Cardiovascular Disease

## 2021-07-18 ENCOUNTER — Encounter: Payer: Self-pay | Admitting: *Deleted

## 2021-07-24 ENCOUNTER — Ambulatory Visit (HOSPITAL_COMMUNITY)
Admission: RE | Admit: 2021-07-24 | Payer: Medicare Other | Source: Ambulatory Visit | Attending: Cardiology | Admitting: Cardiology

## 2021-07-26 ENCOUNTER — Other Ambulatory Visit: Payer: Self-pay

## 2021-08-21 ENCOUNTER — Telehealth: Payer: Self-pay | Admitting: Emergency Medicine

## 2021-08-21 NOTE — Telephone Encounter (Signed)
Call made to [facility] ENT, appt made for referral.   Appt February 2nd at 4:10pm [place]. [phone].   Call made to patient, made aware of the appt. Voiced understanding.   Nothing further needed at this time.

## 2021-08-30 ENCOUNTER — Other Ambulatory Visit: Payer: Self-pay | Admitting: Family Medicine

## 2021-08-30 ENCOUNTER — Other Ambulatory Visit: Payer: Self-pay

## 2021-08-30 DIAGNOSIS — I10 Essential (primary) hypertension: Secondary | ICD-10-CM

## 2021-08-30 MED ORDER — ISOSORBIDE MONONITRATE ER 60 MG PO TB24
ORAL_TABLET | Freq: Every day | ORAL | 0 refills | Status: DC
  Filled 2021-08-30: qty 30, 30d supply, fill #0
  Filled 2021-09-23 – 2021-09-26 (×2): qty 30, 30d supply, fill #1

## 2021-09-01 ENCOUNTER — Inpatient Hospital Stay (HOSPITAL_COMMUNITY)
Admission: EM | Admit: 2021-09-01 | Discharge: 2021-09-05 | DRG: 682 | Disposition: A | Discharge status: Home Health | Payer: Medicare Other | Attending: Internal Medicine | Admitting: Internal Medicine

## 2021-09-01 ENCOUNTER — Encounter (HOSPITAL_COMMUNITY): Payer: Self-pay | Admitting: Internal Medicine

## 2021-09-01 ENCOUNTER — Other Ambulatory Visit: Payer: Self-pay

## 2021-09-01 ENCOUNTER — Inpatient Hospital Stay (HOSPITAL_COMMUNITY): Payer: Medicare Other

## 2021-09-01 ENCOUNTER — Emergency Department (HOSPITAL_COMMUNITY): Payer: Medicare Other

## 2021-09-01 DIAGNOSIS — J45909 Unspecified asthma, uncomplicated: Secondary | ICD-10-CM | POA: Diagnosis present

## 2021-09-01 DIAGNOSIS — E1122 Type 2 diabetes mellitus with diabetic chronic kidney disease: Secondary | ICD-10-CM | POA: Diagnosis present

## 2021-09-01 DIAGNOSIS — J9601 Acute respiratory failure with hypoxia: Secondary | ICD-10-CM | POA: Diagnosis present

## 2021-09-01 DIAGNOSIS — Z7982 Long term (current) use of aspirin: Secondary | ICD-10-CM

## 2021-09-01 DIAGNOSIS — D509 Iron deficiency anemia, unspecified: Secondary | ICD-10-CM | POA: Diagnosis present

## 2021-09-01 DIAGNOSIS — N185 Chronic kidney disease, stage 5: Secondary | ICD-10-CM

## 2021-09-01 DIAGNOSIS — E1140 Type 2 diabetes mellitus with diabetic neuropathy, unspecified: Secondary | ICD-10-CM | POA: Diagnosis present

## 2021-09-01 DIAGNOSIS — Z20822 Contact with and (suspected) exposure to covid-19: Secondary | ICD-10-CM | POA: Diagnosis present

## 2021-09-01 DIAGNOSIS — I5032 Chronic diastolic (congestive) heart failure: Secondary | ICD-10-CM | POA: Diagnosis not present

## 2021-09-01 DIAGNOSIS — R0602 Shortness of breath: Secondary | ICD-10-CM

## 2021-09-01 DIAGNOSIS — N179 Acute kidney failure, unspecified: Secondary | ICD-10-CM | POA: Diagnosis present

## 2021-09-01 DIAGNOSIS — Z79899 Other long term (current) drug therapy: Secondary | ICD-10-CM

## 2021-09-01 DIAGNOSIS — J312 Chronic pharyngitis: Secondary | ICD-10-CM | POA: Diagnosis present

## 2021-09-01 DIAGNOSIS — Z825 Family history of asthma and other chronic lower respiratory diseases: Secondary | ICD-10-CM

## 2021-09-01 DIAGNOSIS — I5033 Acute on chronic diastolic (congestive) heart failure: Secondary | ICD-10-CM | POA: Diagnosis present

## 2021-09-01 DIAGNOSIS — M1990 Unspecified osteoarthritis, unspecified site: Secondary | ICD-10-CM | POA: Diagnosis present

## 2021-09-01 DIAGNOSIS — E119 Type 2 diabetes mellitus without complications: Secondary | ICD-10-CM

## 2021-09-01 DIAGNOSIS — J329 Chronic sinusitis, unspecified: Secondary | ICD-10-CM | POA: Diagnosis present

## 2021-09-01 DIAGNOSIS — Z91048 Other nonmedicinal substance allergy status: Secondary | ICD-10-CM

## 2021-09-01 DIAGNOSIS — R058 Other specified cough: Secondary | ICD-10-CM | POA: Diagnosis not present

## 2021-09-01 DIAGNOSIS — Z8249 Family history of ischemic heart disease and other diseases of the circulatory system: Secondary | ICD-10-CM

## 2021-09-01 DIAGNOSIS — J310 Chronic rhinitis: Secondary | ICD-10-CM | POA: Diagnosis present

## 2021-09-01 DIAGNOSIS — I130 Hypertensive heart and chronic kidney disease with heart failure and stage 1 through stage 4 chronic kidney disease, or unspecified chronic kidney disease: Secondary | ICD-10-CM | POA: Diagnosis not present

## 2021-09-01 DIAGNOSIS — K219 Gastro-esophageal reflux disease without esophagitis: Secondary | ICD-10-CM | POA: Diagnosis present

## 2021-09-01 DIAGNOSIS — I132 Hypertensive heart and chronic kidney disease with heart failure and with stage 5 chronic kidney disease, or end stage renal disease: Secondary | ICD-10-CM | POA: Diagnosis present

## 2021-09-01 DIAGNOSIS — J3089 Other allergic rhinitis: Secondary | ICD-10-CM | POA: Diagnosis not present

## 2021-09-01 DIAGNOSIS — I129 Hypertensive chronic kidney disease with stage 1 through stage 4 chronic kidney disease, or unspecified chronic kidney disease: Secondary | ICD-10-CM | POA: Diagnosis not present

## 2021-09-01 DIAGNOSIS — J302 Other seasonal allergic rhinitis: Secondary | ICD-10-CM | POA: Diagnosis present

## 2021-09-01 DIAGNOSIS — Z888 Allergy status to other drugs, medicaments and biological substances status: Secondary | ICD-10-CM

## 2021-09-01 DIAGNOSIS — Z7984 Long term (current) use of oral hypoglycemic drugs: Secondary | ICD-10-CM

## 2021-09-01 DIAGNOSIS — E871 Hypo-osmolality and hyponatremia: Secondary | ICD-10-CM

## 2021-09-01 DIAGNOSIS — E7800 Pure hypercholesterolemia, unspecified: Secondary | ICD-10-CM | POA: Diagnosis present

## 2021-09-01 DIAGNOSIS — E785 Hyperlipidemia, unspecified: Secondary | ICD-10-CM | POA: Diagnosis not present

## 2021-09-01 DIAGNOSIS — I10 Essential (primary) hypertension: Secondary | ICD-10-CM | POA: Diagnosis present

## 2021-09-01 LAB — LIPID PANEL
Cholesterol: 106 mg/dL (ref 0–200)
HDL: 46 mg/dL (ref >40)
LDL Cholesterol: 48 mg/dL (ref 0–99)
Total CHOL/HDL Ratio: 2.3 RATIO
Triglycerides: 61 mg/dL (ref <150)
VLDL: 12 mg/dL (ref 0–40)

## 2021-09-01 LAB — CBC WITH DIFFERENTIAL/PLATELET
Abs Immature Granulocytes: 0.02 10*3/uL (ref 0.00–0.07)
Basophils Absolute: 0 10*3/uL (ref 0.0–0.1)
Basophils Relative: 0 %
Eosinophils Absolute: 0.2 10*3/uL (ref 0.0–0.5)
Eosinophils Relative: 5 %
HCT: 24.9 % — ABNORMAL LOW (ref 36.0–46.0)
Hemoglobin: 9.1 g/dL — ABNORMAL LOW (ref 12.0–15.0)
Immature Granulocytes: 0 %
Lymphocytes Relative: 15 %
Lymphs Abs: 0.8 10*3/uL (ref 0.7–4.0)
MCH: 29.3 pg (ref 26.0–34.0)
MCHC: 36.5 g/dL — ABNORMAL HIGH (ref 30.0–36.0)
MCV: 80.1 fL (ref 80.0–100.0)
Monocytes Absolute: 0.5 10*3/uL (ref 0.1–1.0)
Monocytes Relative: 9 %
Neutro Abs: 3.7 10*3/uL (ref 1.7–7.7)
Neutrophils Relative %: 71 %
Platelets: 186 10*3/uL (ref 150–400)
RBC: 3.11 MIL/uL — ABNORMAL LOW (ref 3.87–5.11)
RDW: 12.7 % (ref 11.5–15.5)
WBC: 5.3 10*3/uL (ref 4.0–10.5)
nRBC: 0 % (ref 0.0–0.2)

## 2021-09-01 LAB — COMPREHENSIVE METABOLIC PANEL
ALT: 26 U/L (ref 0–44)
AST: 23 U/L (ref 15–41)
Albumin: 3.4 g/dL — ABNORMAL LOW (ref 3.5–5.0)
Alkaline Phosphatase: 121 U/L (ref 38–126)
Anion gap: 11 (ref 5–15)
BUN: 55 mg/dL — ABNORMAL HIGH (ref 8–23)
CO2: 22 mmol/L (ref 22–32)
Calcium: 7.3 mg/dL — ABNORMAL LOW (ref 8.9–10.3)
Chloride: 90 mmol/L — ABNORMAL LOW (ref 98–111)
Creatinine, Ser: 5.2 mg/dL — ABNORMAL HIGH (ref 0.44–1.00)
GFR, Estimated: 8 mL/min — ABNORMAL LOW (ref >60)
Glucose, Bld: 137 mg/dL — ABNORMAL HIGH (ref 70–99)
Potassium: 3.7 mmol/L (ref 3.5–5.1)
Sodium: 123 mmol/L — ABNORMAL LOW (ref 135–145)
Total Bilirubin: 0.5 mg/dL (ref 0.3–1.2)
Total Protein: 6.5 g/dL (ref 6.5–8.1)

## 2021-09-01 LAB — OSMOLALITY: Osmolality: 281 mOsm/kg (ref 275–295)

## 2021-09-01 LAB — BASIC METABOLIC PANEL
Anion gap: 12 (ref 5–15)
BUN: 58 mg/dL — ABNORMAL HIGH (ref 8–23)
CO2: 20 mmol/L — ABNORMAL LOW (ref 22–32)
Calcium: 7.2 mg/dL — ABNORMAL LOW (ref 8.9–10.3)
Chloride: 91 mmol/L — ABNORMAL LOW (ref 98–111)
Creatinine, Ser: 5.29 mg/dL — ABNORMAL HIGH (ref 0.44–1.00)
GFR, Estimated: 8 mL/min — ABNORMAL LOW (ref >60)
Glucose, Bld: 263 mg/dL — ABNORMAL HIGH (ref 70–99)
Potassium: 4.2 mmol/L (ref 3.5–5.1)
Sodium: 123 mmol/L — ABNORMAL LOW (ref 135–145)

## 2021-09-01 LAB — URINALYSIS, ROUTINE W REFLEX MICROSCOPIC
Bilirubin Urine: NEGATIVE
Glucose, UA: 150 mg/dL — AB
Ketones, ur: NEGATIVE mg/dL
Leukocytes,Ua: NEGATIVE
Nitrite: NEGATIVE
Protein, ur: >=300 mg/dL — AB
Specific Gravity, Urine: 1.008 (ref 1.005–1.030)
pH: 7 (ref 5.0–8.0)

## 2021-09-01 LAB — BRAIN NATRIURETIC PEPTIDE: B Natriuretic Peptide: 274.5 pg/mL — ABNORMAL HIGH (ref 0.0–100.0)

## 2021-09-01 LAB — SODIUM, URINE, RANDOM: Sodium, Ur: 27 mmol/L

## 2021-09-01 LAB — TROPONIN I (HIGH SENSITIVITY)
Troponin I (High Sensitivity): 15 ng/L (ref <18)
Troponin I (High Sensitivity): 16 ng/L (ref <18)

## 2021-09-01 LAB — PROTEIN / CREATININE RATIO, URINE
Creatinine, Urine: 47.53 mg/dL
Protein Creatinine Ratio: 8.14 mg/mg{Cre} — ABNORMAL HIGH (ref 0.00–0.15)
Total Protein, Urine: 387 mg/dL

## 2021-09-01 LAB — GLUCOSE, CAPILLARY: Glucose-Capillary: 182 mg/dL — ABNORMAL HIGH (ref 70–99)

## 2021-09-01 LAB — PHOSPHORUS: Phosphorus: 4.4 mg/dL (ref 2.5–4.6)

## 2021-09-01 LAB — RESP PANEL BY RT-PCR (FLU A&B, COVID) ARPGX2
Influenza A by PCR: NEGATIVE
Influenza B by PCR: NEGATIVE
SARS Coronavirus 2 by RT PCR: NEGATIVE

## 2021-09-01 LAB — MAGNESIUM: Magnesium: 2 mg/dL (ref 1.7–2.4)

## 2021-09-01 LAB — OSMOLALITY, URINE: Osmolality, Ur: 228 mOsm/kg — ABNORMAL LOW (ref 300–900)

## 2021-09-01 MED ORDER — CARVEDILOL 12.5 MG PO TABS
12.5000 mg | ORAL_TABLET | Freq: Once | ORAL | Status: AC
  Administered 2021-09-01: 12.5 mg via ORAL
  Filled 2021-09-01: qty 1

## 2021-09-01 MED ORDER — ATORVASTATIN CALCIUM 80 MG PO TABS
80.0000 mg | ORAL_TABLET | Freq: Every day | ORAL | Status: DC
  Administered 2021-09-01: 80 mg via ORAL
  Filled 2021-09-01: qty 2
  Filled 2021-09-01: qty 1

## 2021-09-01 MED ORDER — AMLODIPINE BESYLATE 5 MG PO TABS
10.0000 mg | ORAL_TABLET | Freq: Once | ORAL | Status: AC
  Administered 2021-09-01: 10 mg via ORAL
  Filled 2021-09-01: qty 2

## 2021-09-01 MED ORDER — LORATADINE 10 MG PO TABS
10.0000 mg | ORAL_TABLET | Freq: Every day | ORAL | Status: DC
  Administered 2021-09-01 – 2021-09-05 (×5): 10 mg via ORAL
  Filled 2021-09-01 (×5): qty 1

## 2021-09-01 MED ORDER — ASPIRIN EC 81 MG PO TBEC
81.0000 mg | DELAYED_RELEASE_TABLET | Freq: Every day | ORAL | Status: DC
  Administered 2021-09-01 – 2021-09-05 (×5): 81 mg via ORAL
  Filled 2021-09-01 (×5): qty 1

## 2021-09-01 MED ORDER — ISOSORBIDE MONONITRATE ER 60 MG PO TB24
60.0000 mg | ORAL_TABLET | Freq: Every day | ORAL | Status: DC
  Administered 2021-09-01 – 2021-09-05 (×5): 60 mg via ORAL
  Filled 2021-09-01: qty 1
  Filled 2021-09-01: qty 2
  Filled 2021-09-01 (×3): qty 1

## 2021-09-01 MED ORDER — AMLODIPINE BESYLATE 10 MG PO TABS
10.0000 mg | ORAL_TABLET | Freq: Every day | ORAL | Status: DC
  Administered 2021-09-02: 10 mg via ORAL
  Filled 2021-09-01: qty 1

## 2021-09-01 MED ORDER — ACETAMINOPHEN 650 MG RE SUPP: 650.0000 mg | Freq: Four times a day (QID) | RECTAL | Status: DC | PRN

## 2021-09-01 MED ORDER — FLUTICASONE PROPIONATE 50 MCG/ACT NA SUSP
2.0000 | Freq: Every day | NASAL | Status: DC
  Administered 2021-09-02 – 2021-09-05 (×4): 2 via NASAL
  Filled 2021-09-01: qty 16

## 2021-09-01 MED ORDER — CARVEDILOL 12.5 MG PO TABS
12.5000 mg | ORAL_TABLET | Freq: Two times a day (BID) | ORAL | Status: DC
  Administered 2021-09-01 – 2021-09-05 (×8): 12.5 mg via ORAL
  Filled 2021-09-01 (×8): qty 1

## 2021-09-01 MED ORDER — ACETAMINOPHEN 325 MG PO TABS
650.0000 mg | ORAL_TABLET | Freq: Four times a day (QID) | ORAL | Status: DC | PRN
  Administered 2021-09-01 – 2021-09-05 (×2): 650 mg via ORAL
  Filled 2021-09-01 (×4): qty 2

## 2021-09-01 MED ORDER — HEPARIN SODIUM (PORCINE) 5000 UNIT/ML IJ SOLN
5000.0000 [IU] | Freq: Three times a day (TID) | INTRAMUSCULAR | Status: DC
  Administered 2021-09-01 – 2021-09-05 (×12): 5000 [IU] via SUBCUTANEOUS
  Filled 2021-09-01 (×11): qty 1

## 2021-09-01 MED ORDER — FUROSEMIDE 10 MG/ML IJ SOLN: 20.0000 mg | Freq: Once | INTRAMUSCULAR | Status: DC

## 2021-09-01 MED ORDER — ALBUTEROL SULFATE (2.5 MG/3ML) 0.083% IN NEBU: 2.5000 mg | INHALATION_SOLUTION | Freq: Four times a day (QID) | RESPIRATORY_TRACT | Status: DC | PRN

## 2021-09-01 NOTE — Hospital Course (Addendum)
HPI per Dr. Raymondo Band "History of Present Illness: Paula [name] is a 76 yo female with HFpEF (EF 79-89%, grade I diastolic dysfunction), Q1JH, HTN, and CKD stage V pending dialysis initiation who presented with shortness of breath.   The patient states that around 5am this morning, she felt short of breath all of a sudden while laying down. She states that she could not catch her breath, nor could she speak at this time. The patient states that she has felt short of breath before, but it has never gotten as bad as it felt today. She called EMS at this time and received albuterol and atrovent en route to the hospital, which she states significantly helped her breathing. At this time, she does still feel short of breath, however, she feels much better already. She also endorses a productive cough, although she notes this has been chronic for the last 30 years. Cough produces a clear phlegm, which is not new. She denies any fevers, chills, blurry vision, lightheadedness, chest pain, abd pain, n/v/d, dysuria, or hematuria. She denies any recent illnesses or sick contacts. She does state that she has mostly been eating rice and bread/toast, as she has difficulty cooking for herself. Her appetite is good, however, she just has difficulty accessing foods and getting to the grocery store often. She denies any recent weight loss/weight gain."   ED course: CBC with Hb of 9.1, no leukocytosis. CMP with hyponatremia at 123, elevated Cr at 5.2 and GFR of 8 (6 months ago was 3.6 and GFR of 13), albumin 3.4 and corrected calcium of 7.8. Trops negative. Respiratory panel neg for COVID or flu. CXR with small left sided pleural effusion and some pulmonary vascular congestion. IMTS called for admission.     #Acute on chronic kidney disease stage V Patient has CKD stage V and is well-established with [facility]. She notes that she has been told she will need dialysis in the future, however, this has not yet started nor  does she have any dialysis access. Baseline Cr appears to be around 3.5-4, with GFR of 13, however, today, Cr elevated to 5.2 with GFR of 8. Potassium and bicarb level within normal limits and there are no urgent indications for dialysis at this time. Patient appears euvolemic on exam, with no crackles appreciated and no LE edema.  - Renal ultrasound pending - Urinalysis - Urine sodium, urine osm - Serum osm and protein/Cr ratio    #Hyponatremia Patient noted to be hyponatremic at 123, was previously at 133 six months ago. She does not appear overtly volume overloaded on exam. She notes that she drinks a lot of water, and mostly has been eating toast and rice at home, as she has difficulty accessing food. Will fluid restrict her today and recheck BMP in the afternoon to assess sodium level. - 1200 mL fluid restriction  - Urine studies as noted above - TSH - AM cortisol  - Repeat BMP in the afternoon    #Shortness of breath Patient presented in respiratory distress, stating that she could not catch her breath at home and noting that she could not speak. Of note, she had PFTs performed in 2019 with normal lung function/no evidence of COPD or asthma. She does have a history of chronic cough and chronic sinusitis, which could be contributing to her symptoms. On exam, she does not appear overtly volume overloaded. Patient was never hypoxic and is saturating well on room air currently. She received albuterol by EMS and noted  a significant improvement in her symptoms. - Albuterol q6h prn - Flonase daily - Claritin 10 mg daily (home med)  #HFpEF Last echo in July with EF of 65-70%, no regional wall motion abnormalities, severe LVH, and grade I diastolic dysfunction. Patient is on coreg 12.5 mg bid and imdur 60 mg daily at home. Which were continued during inpatient.    #Hypertension On amlodipine 10 mg, coreg 12.5 mg bid, and imdur 60 mg daily at home. SBP significantly elevated upon arrival to the  ED, in the 200s, however, this has now normalized to 137 upon my evaluation. Home meds were continued while in the hospital.   #Type 2 diabetes Last A1c was 6.9 in October. Patient notes that she is on Glipizide 5 mg, but does not take this daily. She states she only takes it as needed, when her sugar is high. Will consult RD for assessment of nutrition requirements/status.  - CBGs - A1c pending   #Social determinants of health Patient has difficulty accessing foods, as she is not able to drive, nor is she able to complete her IADLs independently. She notes that she is not able to cook for herself much and she has difficulty getting to the grocery store, although she can sometimes catch an [place] ride. She does not have any help at home and would like to speak to a Education officer, museum. - TOC/Social work consulted, appreciate assistance - PT/OT

## 2021-09-01 NOTE — ED Notes (Signed)
Family at bedside. 
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
## 2021-09-01 NOTE — ED Notes (Signed)
Admit Mds at bedside
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
## 2021-09-01 NOTE — ED Triage Notes (Signed)
Pt brought to ED by [facility] via stretcher to room 29 with c/o respiratory distress. Per EMS, pt called for [facility] x past few hours, has hx of ESRD pending hemodialysis. Initial lung sounds diminished, albuterol tx x2 and Atrovent given en route to facility. Upon arrival to ED, course lung sounds observed with no acute distress.

## 2021-09-01 NOTE — ED Notes (Signed)
Pt NAD in bed, a/ox4. Pt c/o SOB x 3-5 days with describing not being able to breath at 0500 today. Pt denies new cough, fever, CP. Pt states she is a stage 4 kidney pt with a dialysis port implant scheduled and has had some edema in her legs. LS clear bilaterally, VSS

## 2021-09-01 NOTE — Plan of Care (Signed)
  Problem: Nutrition: Goal: Adequate nutrition will be maintained Outcome: Progressing   Problem: Elimination: Goal: Will not experience complications related to urinary retention Outcome: Progressing   

## 2021-09-02 ENCOUNTER — Other Ambulatory Visit: Payer: Self-pay

## 2021-09-02 DIAGNOSIS — N179 Acute kidney failure, unspecified: Secondary | ICD-10-CM | POA: Diagnosis not present

## 2021-09-02 DIAGNOSIS — I5032 Chronic diastolic (congestive) heart failure: Secondary | ICD-10-CM

## 2021-09-02 DIAGNOSIS — I130 Hypertensive heart and chronic kidney disease with heart failure and stage 1 through stage 4 chronic kidney disease, or unspecified chronic kidney disease: Secondary | ICD-10-CM | POA: Diagnosis not present

## 2021-09-02 DIAGNOSIS — E1122 Type 2 diabetes mellitus with diabetic chronic kidney disease: Secondary | ICD-10-CM

## 2021-09-02 DIAGNOSIS — E871 Hypo-osmolality and hyponatremia: Secondary | ICD-10-CM

## 2021-09-02 DIAGNOSIS — N185 Chronic kidney disease, stage 5: Secondary | ICD-10-CM

## 2021-09-02 LAB — BASIC METABOLIC PANEL
Anion gap: 8 (ref 5–15)
Anion gap: 11 (ref 5–15)
BUN: 66 mg/dL — ABNORMAL HIGH (ref 8–23)
BUN: 59 mg/dL — ABNORMAL HIGH (ref 8–23)
CO2: 23 mmol/L (ref 22–32)
CO2: 23 mmol/L (ref 22–32)
Calcium: 7.3 mg/dL — ABNORMAL LOW (ref 8.9–10.3)
Calcium: 7.5 mg/dL — ABNORMAL LOW (ref 8.9–10.3)
Chloride: 97 mmol/L — ABNORMAL LOW (ref 98–111)
Chloride: 96 mmol/L — ABNORMAL LOW (ref 98–111)
Creatinine, Ser: 5.37 mg/dL — ABNORMAL HIGH (ref 0.44–1.00)
Creatinine, Ser: 5.45 mg/dL — ABNORMAL HIGH (ref 0.44–1.00)
GFR, Estimated: 8 mL/min — ABNORMAL LOW (ref >60)
GFR, Estimated: 8 mL/min — ABNORMAL LOW (ref >60)
Glucose, Bld: 161 mg/dL — ABNORMAL HIGH (ref 70–99)
Glucose, Bld: 118 mg/dL — ABNORMAL HIGH (ref 70–99)
Potassium: 4.6 mmol/L (ref 3.5–5.1)
Potassium: 4.1 mmol/L (ref 3.5–5.1)
Sodium: 128 mmol/L — ABNORMAL LOW (ref 135–145)
Sodium: 130 mmol/L — ABNORMAL LOW (ref 135–145)

## 2021-09-02 LAB — IRON AND TIBC
Iron: 62 ug/dL (ref 28–170)
Saturation Ratios: 19 % (ref 10.4–31.8)
TIBC: 335 ug/dL (ref 250–450)
UIBC: 273 ug/dL

## 2021-09-02 LAB — CBC
HCT: 23 % — ABNORMAL LOW (ref 36.0–46.0)
Hemoglobin: 8.6 g/dL — ABNORMAL LOW (ref 12.0–15.0)
MCH: 29.9 pg (ref 26.0–34.0)
MCHC: 37.4 g/dL — ABNORMAL HIGH (ref 30.0–36.0)
MCV: 79.9 fL — ABNORMAL LOW (ref 80.0–100.0)
Platelets: 170 10*3/uL (ref 150–400)
RBC: 2.88 MIL/uL — ABNORMAL LOW (ref 3.87–5.11)
RDW: 12.8 % (ref 11.5–15.5)
WBC: 5 10*3/uL (ref 4.0–10.5)
nRBC: 0 % (ref 0.0–0.2)

## 2021-09-02 LAB — GLUCOSE, CAPILLARY
Glucose-Capillary: 124 mg/dL — ABNORMAL HIGH (ref 70–99)
Glucose-Capillary: 154 mg/dL — ABNORMAL HIGH (ref 70–99)
Glucose-Capillary: 143 mg/dL — ABNORMAL HIGH (ref 70–99)
Glucose-Capillary: 127 mg/dL — ABNORMAL HIGH (ref 70–99)

## 2021-09-02 LAB — TSH: TSH: 2.979 u[IU]/mL (ref 0.350–4.500)

## 2021-09-02 LAB — FERRITIN: Ferritin: 55 ng/mL (ref 11–307)

## 2021-09-02 LAB — CORTISOL-AM, BLOOD: Cortisol - AM: 6 ug/dL — ABNORMAL LOW (ref 6.7–22.6)

## 2021-09-02 MED ORDER — INSULIN ASPART 100 UNIT/ML IJ SOLN: 0.0000 [IU] | Freq: Every day | INTRAMUSCULAR | Status: DC

## 2021-09-02 MED ORDER — SODIUM CHLORIDE 0.9 % IV SOLN
250.0000 mg | Freq: Every day | INTRAVENOUS | Status: DC
  Administered 2021-09-02 – 2021-09-04 (×3): 250 mg via INTRAVENOUS
  Filled 2021-09-02 (×4): qty 20

## 2021-09-02 MED ORDER — ATORVASTATIN CALCIUM 40 MG PO TABS
40.0000 mg | ORAL_TABLET | Freq: Every day | ORAL | Status: DC
  Administered 2021-09-02 – 2021-09-05 (×4): 40 mg via ORAL
  Filled 2021-09-02 (×4): qty 1

## 2021-09-02 MED ORDER — INSULIN ASPART 100 UNIT/ML IJ SOLN: 0.0000 [IU] | Freq: Three times a day (TID) | INTRAMUSCULAR | Status: DC

## 2021-09-02 MED ORDER — DARBEPOETIN ALFA 150 MCG/0.3ML IJ SOSY
150.0000 ug | PREFILLED_SYRINGE | INTRAMUSCULAR | Status: DC
  Administered 2021-09-02: 150 ug via SUBCUTANEOUS
  Filled 2021-09-02: qty 0.3

## 2021-09-02 MED ORDER — FUROSEMIDE 10 MG/ML IJ SOLN
80.0000 mg | Freq: Two times a day (BID) | INTRAMUSCULAR | Status: DC
  Administered 2021-09-02 – 2021-09-04 (×4): 80 mg via INTRAVENOUS
  Filled 2021-09-02 (×5): qty 8

## 2021-09-02 MED ORDER — NEPRO/CARBSTEADY PO LIQD
237.0000 mL | ORAL | Status: DC
  Administered 2021-09-02: 237 mL via ORAL

## 2021-09-02 MED ORDER — AMLODIPINE BESYLATE 5 MG PO TABS
5.0000 mg | ORAL_TABLET | Freq: Every day | ORAL | Status: DC
  Administered 2021-09-03 – 2021-09-05 (×3): 5 mg via ORAL
  Filled 2021-09-02 (×3): qty 1

## 2021-09-02 NOTE — Consult Note (Signed)
[facility] Renal Consultation Note  Requesting MD: [place] Indication for Consultation: stage 5 CKD  HPI:  Paula [name] is a 76 y.o. female with DM, HTN, vascular neck aneurysms, hypercholesterolemia and advanced CKD- followed by Dr. [name] as an OP-  crt of late in the high 4's and 5's-  last seen by him on 08/01/21-  had been to [facility]-  determined to not be a candidate for a fistula-  plan was for an AVG closer to dialysis need.  It is slightly difficult to get the history out of the patient-  she apparently was in her usual state of health but then describes difficulty breathing at night when laying down and also maybe some funny tasting to food.  But she denies nausea/vomiting or excessive fatigue-  although there is some conflicting information there as well.  I guess she called 911 because she could not breathe last night but when arrived to ER she felt better-  she is currently sitting up in bed with no c/o SOB-  says the food issue is because she does not have anyone to prepare it.  Crt here 5.45-  on 12/22 was 5.0 so not much change in GFR -- albumin 3.4-  hgb 8.6.  She is on O2 here but does not wear at home -  CXR showed some inc vasculature  Creat  Date/Time Value Ref Range Status  06/27/2016 11:52 AM 1.52 (H) 0.50 - 0.99 mg/dL Final    Comment:      For patients > or = 75 years of age: The upper reference limit for Creatinine is approximately 13% higher for people identified as African-American.     01/25/2016 09:01 AM 1.53 (H) 0.50 - 0.99 mg/dL Final    Comment:      For patients > or = 75 years of age: The upper reference limit for Creatinine is approximately 13% higher for people identified as African-American.     06/05/2015 12:00 AM 1.32 (H) 0.50 - 0.99 mg/dL Final  03/28/2015 12:03 PM 1.64 (H) 0.50 - 0.99 mg/dL Final  12/14/2014 09:21 AM 1.48 (H) 0.50 - 1.10 mg/dL Final  12/14/2014 09:21 AM 1.48 (H) 0.50 - 1.10 mg/dL Final   Creatinine, Ser   Date/Time Value Ref Range Status  09/02/2021 07:14 AM 5.45 (H) 0.44 - 1.00 mg/dL Final  09/02/2021 03:00 AM 5.37 (H) 0.44 - 1.00 mg/dL Final  09/01/2021 10:41 AM 5.29 (H) 0.44 - 1.00 mg/dL Final  09/01/2021 06:41 AM 5.20 (H) 0.44 - 1.00 mg/dL Final  02/23/2021 01:51 AM 3.58 (H) 0.44 - 1.00 mg/dL Final  02/22/2021 02:05 AM 3.56 (H) 0.44 - 1.00 mg/dL Final  02/21/2021 05:38 AM 4.00 (H) 0.44 - 1.00 mg/dL Final  02/21/2021 03:32 AM 3.90 (H) 0.44 - 1.00 mg/dL Final  01/04/2021 11:53 AM 3.40 (H) 0.57 - 1.00 mg/dL Final  12/29/2020 02:36 PM 3.77 (H) 0.44 - 1.00 mg/dL Final  09/14/2019 09:12 AM 2.47 (H) 0.57 - 1.00 mg/dL Final  03/26/2019 04:48 PM 1.73 (H) 0.44 - 1.00 mg/dL Final  02/25/2019 11:53 AM 2.29 (H) 0.57 - 1.00 mg/dL Final  02/03/2019 01:33 PM 1.86 (H) 0.57 - 1.00 mg/dL Final  09/02/2018 12:11 PM 2.08 (H) 0.57 - 1.00 mg/dL Final  07/16/2018 11:45 AM 1.88 (H) 0.44 - 1.00 mg/dL Final  05/12/2018 03:58 PM 1.74 (H) 0.44 - 1.00 mg/dL Final  05/12/2018 12:51 PM 2.00 (H) 0.44 - 1.00 mg/dL Final  12/09/2017 10:35 AM 1.58 (H) 0.44 - 1.00 mg/dL Final  09/24/2017 12:14 PM 1.88 (H) 0.57 - 1.00 mg/dL Final  09/15/2017 08:46 AM 1.74 (H) 0.44 - 1.00 mg/dL Final  09/03/2017 05:12 AM 1.52 (H) 0.44 - 1.00 mg/dL Final  09/02/2017 04:11 AM 1.56 (H) 0.44 - 1.00 mg/dL Final  09/02/2017 01:44 AM 1.41 (H) 0.44 - 1.00 mg/dL Final  09/01/2017 04:05 PM 1.50 (H) 0.44 - 1.00 mg/dL Final  09/01/2017 03:28 PM 1.75 (H) 0.44 - 1.00 mg/dL Final  07/26/2017 03:55 PM 1.69 (H) 0.44 - 1.00 mg/dL Final  07/02/2017 05:12 PM 2.38 (H) 0.44 - 1.00 mg/dL Final  05/08/2017 08:45 AM 1.58 (H) 0.57 - 1.00 mg/dL Final  03/25/2017 01:12 AM 1.64 (H) 0.44 - 1.00 mg/dL Final  01/05/2017 10:05 AM 1.37 (H) 0.44 - 1.00 mg/dL Final  09/04/2014 07:47 AM 1.25 (H) 0.50 - 1.10 mg/dL Final  12/22/2012 10:00 AM 1.23 (H) 0.50 - 1.10 mg/dL Final  05/07/2012 09:11 PM 1.30 (H) 0.50 - 1.10 mg/dL Final  02/19/2012 04:21 PM 1.22 (H) 0.50 - 1.10  mg/dL Final  10/11/2011 05:50 AM 1.04 0.50 - 1.10 mg/dL Final  10/10/2011 03:41 PM 1.11 (H) 0.50 - 1.10 mg/dL Final  10/10/2011 01:29 PM 1.32 (H) 0.50 - 1.10 mg/dL Final    Comment:    QUESTIONABLE RESULTS, RECOMMEND RECOLLECT TO VERIFY QUESTIONABLE IDENTIFICATION / INCORRECTLY LABELED SPECIMEN  04/21/2011 11:00 AM 1.33 (H) 0.50 - 1.10 mg/dL Final  10/04/2009 04:22 PM 0.95 0.4 - 1.2 mg/dL Final  05/17/2008 05:17 AM 1.12  Final  05/15/2008 09:43 PM 1.25 (H)  Final  05/15/2008 10:40 AM 1.14  Final     PMHx:   Past Medical History:  Diagnosis Date   Allergic rhinitis    Arthritis    Asthma    Brain aneurysm    Chronic kidney disease    Cough    Diabetes mellitus    Diastolic CHF, chronic ([place]) 10/11/2011   GERD (gastroesophageal reflux disease)    History of colon polyps 2012   tubular adenoma    Hyperlipidemia    Hypertension    Neuropathy 10/11/2011   PONV (postoperative nausea and vomiting)    one time after lymph node surgery    Past Surgical History:  Procedure Laterality Date   CATARACT EXTRACTION     right eye   IR 3D INDEPENDENT [facility]KST  12/14/2017   IR ANGIO INTRA EXTRACRAN SEL COM CAROTID INNOMINATE BILAT MOD SED  09/15/2017   IR ANGIO INTRA EXTRACRAN SEL COM CAROTID INNOMINATE UNI L MOD SED  12/14/2017   IR ANGIO VERTEBRAL SEL VERTEBRAL BILAT MOD SED  09/15/2017   IR RADIOLOGIST EVAL & MGMT  09/10/2017   IR RADIOLOGIST EVAL & MGMT  10/19/2017   lymphatic mass surgery     NASAL TURBINATE REDUCTION     RADIOLOGY WITH ANESTHESIA N/A 12/14/2017   Procedure: RADIOLOGY WITH ANESTHESIA EMBOLIZATION;  Surgeon: Luanne Bras, MD;  Location: [facility];  Service: Radiology;  Laterality: N/A;   VIDEO BRONCHOSCOPY Bilateral 11/18/2017   Procedure: VIDEO BRONCHOSCOPY WITHOUT FLUORO;  Surgeon: Collene Gobble, MD;  Location: [facility];  Service: Cardiopulmonary;  Laterality: Bilateral;    Family Hx:  Family History  Problem Relation Age of Onset   Hypertension  Mother    Asthma Sister    Colon cancer Neg Hx    Allergic rhinitis Neg Hx    Eczema Neg Hx     Social History:  reports that she has never smoked. She has never used smokeless tobacco. She reports that she does not  drink alcohol and does not use drugs.  Allergies:  Allergies  Allergen Reactions   Dust Mite Extract Cough   Ace Inhibitors Cough   Lovastatin Other (See Comments)    Generalized body pain    Medications: Prior to Admission medications   Medication Sig Start Date End Date Taking? Authorizing Provider  acetaminophen (TYLENOL) 500 MG tablet Take 500 mg by mouth every 6 (six) hours as needed for fever, moderate pain or mild pain.   Yes [provider]  albuterol (VENTOLIN HFA) 108 (90 Base) MCG/ACT inhaler Inhale 1-2 puffs into the lungs every 6 (six) hours as needed. Patient taking differently: Inhale 1-2 puffs into the lungs every 6 (six) hours as needed for wheezing or shortness of breath. 05/07/21  Yes Parrett, Tammy S, NP  amLODipine (NORVASC) 10 MG tablet Take 1 tablet (10 mg total) by mouth daily. 06/06/21 09/01/21 Yes Charlott Rakes, MD  aspirin EC 81 MG tablet Take 81 mg by mouth every evening.   Yes [provider]  atorvastatin (LIPITOR) 80 MG tablet Take 1 tablet (80 mg total) by mouth daily. 05/02/21  Yes Charlott Rakes, MD  calcitRIOL (ROCALTROL) 0.25 MCG capsule Take 0.25 mcg by mouth daily.   Yes [provider]  carvedilol (COREG) 12.5 MG tablet Take 1 tablet (12.5 mg total) by mouth 2 (two) times daily with a meal. 03/05/21 09/15/21 Yes Newlin, Enobong, MD  fluticasone (FLONASE) 50 MCG/ACT nasal spray Place 2 sprays into both nostrils daily. 03/15/21  Yes Collene Gobble, MD  glipiZIDE (GLUCOTROL) 5 MG tablet Take 0.5 tablets (2.5 mg total) by mouth daily before breakfast. 06/06/21  Yes Newlin, Enobong, MD  isosorbide mononitrate (IMDUR) 60 MG 24 hr tablet TAKE 1 TABLET (60 MG TOTAL) BY MOUTH DAILY. Patient taking differently: Take 60 mg  by mouth daily. 08/30/21 08/30/22 Yes Charlott Rakes, MD  loratadine (CLARITIN) 10 MG tablet Take 1 tablet (10 mg total) by mouth daily. 03/15/21  Yes Collene Gobble, MD  glucose blood (ACCU-CHEK GUIDE) test strip Use as instructed 3 times daily 07/02/21   Charlott Rakes, MD  Misc. Devices [employer].  Diagnosis - unstable gait 03/05/21   Charlott Rakes, MD  sodium chloride (OCEAN) 0.65 % SOLN nasal spray Place 2 sprays into both nostrils as needed for congestion. Patient not taking: No sig reported 03/21/19 02/21/21  Martyn Ehrich, NP    I have reviewed the patient's current medications.  Labs:  Results for orders placed or performed during the hospital encounter of 09/01/21 (from the past 48 hour(s))  Brain natriuretic peptide     Status: Abnormal   Collection Time: 09/01/21  6:41 AM  Result Value Ref Range   B Natriuretic Peptide 274.5 (H) 0.0 - 100.0 pg/mL    Comment: Performed at [facility] [address], [place], [place] [address]  CBC with Differential     Status: Abnormal   Collection Time: 09/01/21  6:41 AM  Result Value Ref Range   WBC 5.3 4.0 - 10.5 K/uL   RBC 3.11 (L) 3.87 - 5.11 MIL/uL   Hemoglobin 9.1 (L) 12.0 - 15.0 g/dL   HCT 24.9 (L) 36.0 - 46.0 %   MCV 80.1 80.0 - 100.0 fL   MCH 29.3 26.0 - 34.0 pg   MCHC 36.5 (H) 30.0 - 36.0 g/dL   RDW 12.7 11.5 - 15.5 %   Platelets 186 150 - 400 K/uL   nRBC 0.0 0.0 - 0.2 %   Neutrophils Relative %  71 %   Neutro Abs 3.7 1.7 - 7.7 K/uL   Lymphocytes Relative 15 %   Lymphs Abs 0.8 0.7 - 4.0 K/uL   Monocytes Relative 9 %   Monocytes Absolute 0.5 0.1 - 1.0 K/uL   Eosinophils Relative 5 %   Eosinophils Absolute 0.2 0.0 - 0.5 K/uL   Basophils Relative 0 %   Basophils Absolute 0.0 0.0 - 0.1 K/uL   Immature Granulocytes 0 %   Abs Immature Granulocytes 0.02 0.00 - 0.07 K/uL    Comment: Performed at [facility] [address] [place], [place] [address]  Comprehensive metabolic panel     Status: Abnormal    Collection Time: 09/01/21  6:41 AM  Result Value Ref Range   Sodium 123 (L) 135 - 145 mmol/L   Potassium 3.7 3.5 - 5.1 mmol/L   Chloride 90 (L) 98 - 111 mmol/L   CO2 22 22 - 32 mmol/L   Glucose, Bld 137 (H) 70 - 99 mg/dL    Comment: Glucose reference range applies only to samples taken after fasting for at least 8 hours.   BUN 55 (H) 8 - 23 mg/dL   Creatinine, Ser 5.20 (H) 0.44 - 1.00 mg/dL   Calcium 7.3 (L) 8.9 - 10.3 mg/dL   Total Protein 6.5 6.5 - 8.1 g/dL   Albumin 3.4 (L) 3.5 - 5.0 g/dL   AST 23 15 - 41 U/L   ALT 26 0 - 44 U/L   Alkaline Phosphatase 121 38 - 126 U/L   Total Bilirubin 0.5 0.3 - 1.2 mg/dL   GFR, Estimated 8 (L) >60 mL/min    Comment: (NOTE) Calculated using the CKD-EPI Creatinine Equation (2021)    Anion gap 11 5 - 15    Comment: Performed at [facility], [place] [address] [place], [place] [address]  Troponin I (High Sensitivity)     Status: None   Collection Time: 09/01/21  6:41 AM  Result Value Ref Range   Troponin I (High Sensitivity) 15 <18 ng/L    Comment: (NOTE) Elevated high sensitivity troponin I (hsTnI) values and significant  changes across serial measurements may suggest ACS but many other  chronic and acute conditions are known to elevate hsTnI results.  Refer to the "Links" section for chest pain algorithms and additional  guidance. Performed at [facility], [place] [address] [place], [place] [address]   Resp Panel by RT-PCR (Flu A&B, Covid) Nasopharyngeal Swab     Status: None   Collection Time: 09/01/21  7:29 AM   Specimen: Nasopharyngeal Swab; Nasopharyngeal(NP) swabs in vial transport medium  Result Value Ref Range   SARS Coronavirus 2 by RT PCR NEGATIVE NEGATIVE    Comment: (NOTE) SARS-CoV-2 target nucleic acids are NOT DETECTED.  The SARS-CoV-2 RNA is generally detectable in upper respiratory specimens during the acute phase of infection. The lowest concentration of SARS-CoV-2 viral copies this assay can detect is 138  copies/mL. A negative result does not preclude SARS-Cov-2 infection and should not be used as the sole basis for treatment or other patient management decisions. A negative result may occur with  improper specimen collection/handling, submission of specimen other than nasopharyngeal swab, presence of viral mutation(s) within the areas targeted by this assay, and inadequate number of viral copies(<138 copies/mL). A negative result must be combined with clinical observations, patient history, and epidemiological information. The expected result is Negative.  Fact Sheet for Patients:  EntrepreneurPulse.com.au  Fact Sheet for Healthcare Providers:  IncredibleEmployment.be  This test is no t yet approved or cleared by the [place] and  has been authorized for detection and/or diagnosis of SARS-CoV-2 by FDA under an Emergency Use Authorization (EUA). This EUA will remain  in effect (meaning this test can be used) for the duration of the COVID-19 declaration under Section 564(b)(1) of the Act, 21 U.S.C.section 360bbb-3(b)(1), unless the authorization is terminated  or revoked sooner.       Influenza A by PCR NEGATIVE NEGATIVE   Influenza B by PCR NEGATIVE NEGATIVE    Comment: (NOTE) The Xpert Xpress SARS-CoV-2/FLU/RSV plus assay is intended as an aid in the diagnosis of influenza from Nasopharyngeal swab specimens and should not be used as a sole basis for treatment. Nasal washings and aspirates are unacceptable for Xpert Xpress SARS-CoV-2/FLU/RSV testing.  Fact Sheet for Patients: EntrepreneurPulse.com.au  Fact Sheet for Healthcare Providers: IncredibleEmployment.be  This test is not yet approved or cleared by the [place] FDA and has been authorized for detection and/or diagnosis of SARS-CoV-2 by FDA under an Emergency Use Authorization (EUA). This EUA will remain in effect (meaning this test  can be used) for the duration of the COVID-19 declaration under Section 564(b)(1) of the Act, 21 U.S.C. section 360bbb-3(b)(1), unless the authorization is terminated or revoked.  Performed at [facility], [place] [address] [place], [place] [address]   Troponin I (High Sensitivity)     Status: None   Collection Time: 09/01/21  8:47 AM  Result Value Ref Range   Troponin I (High Sensitivity) 16 <18 ng/L    Comment: (NOTE) Elevated high sensitivity troponin I (hsTnI) values and significant  changes across serial measurements may suggest ACS but many other  chronic and acute conditions are known to elevate hsTnI results.  Refer to the "Links" section for chest pain algorithms and additional  guidance. Performed at [facility], [place] [address] [place], [place] [address]   Sodium, urine, random     Status: None   Collection Time: 09/01/21  9:31 AM  Result Value Ref Range   Sodium, Ur 27 mmol/L    Comment: Performed at [facility] [address], [place], [place] [address]  Protein / creatinine ratio, urine     Status: Abnormal   Collection Time: 09/01/21  9:31 AM  Result Value Ref Range   Creatinine, Urine 47.53 mg/dL   Total Protein, Urine 387 mg/dL    Comment: NO NORMAL RANGE ESTABLISHED FOR THIS TEST RESULTS CONFIRMED BY MANUAL DILUTION    Protein Creatinine Ratio 8.14 (H) 0.00 - 0.15 mg/mg[Cre]    Comment: Performed at [facility] [address], [place], [place] [address]  Osmolality, urine     Status: Abnormal   Collection Time: 09/01/21  9:32 AM  Result Value Ref Range   Osmolality, Ur 228 (L) 300 - 900 mOsm/kg    Comment: Performed at [facility] [address], [place], [place] [address]  Phosphorus     Status: None   Collection Time: 09/01/21  9:52 AM  Result Value Ref Range   Phosphorus 4.4 2.5 - 4.6 mg/dL    Comment: Performed at [facility] [address], [place], [place] [address]  Basic metabolic panel     Status:  Abnormal   Collection Time: 09/01/21 10:41 AM  Result Value Ref Range   Sodium 123 (L) 135 - 145 mmol/L   Potassium 4.2 3.5 - 5.1 mmol/L  Chloride 91 (L) 98 - 111 mmol/L   CO2 20 (L) 22 - 32 mmol/L   Glucose, Bld 263 (H) 70 - 99 mg/dL    Comment: Glucose reference range applies only to samples taken after fasting for at least 8 hours.   BUN 58 (H) 8 - 23 mg/dL   Creatinine, Ser 5.29 (H) 0.44 - 1.00 mg/dL   Calcium 7.2 (L) 8.9 - 10.3 mg/dL   GFR, Estimated 8 (L) >60 mL/min    Comment: (NOTE) Calculated using the CKD-EPI Creatinine Equation (2021)    Anion gap 12 5 - 15    Comment: Performed at [facility] [address], [place], [place] [address]  Magnesium     Status: None   Collection Time: 09/01/21 10:41 AM  Result Value Ref Range   Magnesium 2.0 1.7 - 2.4 mg/dL    Comment: Performed at [facility] [address], [place], [place] [address]  Osmolality     Status: None   Collection Time: 09/01/21 10:41 AM  Result Value Ref Range   Osmolality 281 275 - 295 mOsm/kg    Comment: Performed at [facility] [address], [place], [place] [address]  Lipid panel     Status: None   Collection Time: 09/01/21 10:41 AM  Result Value Ref Range   Cholesterol 106 0 - 200 mg/dL   Triglycerides 61 <150 mg/dL   HDL 46 >40 mg/dL   Total CHOL/HDL Ratio 2.3 RATIO   VLDL 12 0 - 40 mg/dL   LDL Cholesterol 48 0 - 99 mg/dL    Comment:        Total Cholesterol/HDL:CHD Risk Coronary Heart Disease Risk Table                     Men   Women  1/2 Average Risk   3.4   3.3  Average Risk       5.0   4.4  2 X Average Risk   9.6   7.1  3 X Average Risk  23.4   11.0        Use the calculated Patient Ratio above and the CHD Risk Table to determine the patient's CHD Risk.        ATP III CLASSIFICATION (LDL):  <100     mg/dL   Optimal  100-129  mg/dL   Near or Above                    Optimal  130-159  mg/dL   Borderline  160-189  mg/dL   High  >190     mg/dL   Very  High Performed at [facility] [address], [place], [place] [address]   Urinalysis, Routine w reflex microscopic     Status: Abnormal   Collection Time: 09/01/21 12:33 PM  Result Value Ref Range   Color, Urine STRAW (A) YELLOW   APPearance CLEAR CLEAR   Specific Gravity, Urine 1.008 1.005 - 1.030   pH 7.0 5.0 - 8.0   Glucose, UA 150 (A) NEGATIVE mg/dL   Hgb urine dipstick SMALL (A) NEGATIVE   Bilirubin Urine NEGATIVE NEGATIVE   Ketones, ur NEGATIVE NEGATIVE mg/dL   Protein, ur >=300 (A) NEGATIVE mg/dL   Nitrite NEGATIVE NEGATIVE   Leukocytes,Ua NEGATIVE NEGATIVE   RBC / HPF 0-5 0 - 5 RBC/hpf   WBC, UA 0-5 0 - 5 WBC/hpf   Bacteria, UA RARE (A) NONE  SEEN   Squamous Epithelial / LPF 0-5 0 - 5    Comment: Performed at [facility], [place] [address], [place], [place] [address]  Glucose, capillary     Status: Abnormal   Collection Time: 09/01/21  9:14 PM  Result Value Ref Range   Glucose-Capillary 182 (H) 70 - 99 mg/dL    Comment: Glucose reference range applies only to samples taken after fasting for at least 8 hours.  TSH     Status: None   Collection Time: 09/02/21  3:00 AM  Result Value Ref Range   TSH 2.979 0.350 - 4.500 uIU/mL    Comment: Performed by a 3rd Generation assay with a functional sensitivity of <=0.01 uIU/mL. Performed at [facility], [place] [address], [place], [place] [address]   Basic metabolic panel     Status: Abnormal   Collection Time: 09/02/21  3:00 AM  Result Value Ref Range   Sodium 128 (L) 135 - 145 mmol/L   Potassium 4.6 3.5 - 5.1 mmol/L   Chloride 97 (L) 98 - 111 mmol/L   CO2 23 22 - 32 mmol/L   Glucose, Bld 161 (H) 70 - 99 mg/dL    Comment: Glucose reference range applies only to samples taken after fasting for at least 8 hours.   BUN 66 (H) 8 - 23 mg/dL   Creatinine, Ser 5.37 (H) 0.44 - 1.00 mg/dL   Calcium 7.3 (L) 8.9 - 10.3 mg/dL   GFR, Estimated 8 (L) >60 mL/min    Comment: (NOTE) Calculated using the CKD-EPI Creatinine  Equation (2021)    Anion gap 8 5 - 15    Comment: Performed at [facility] [address] [place], [place] [address]  Glucose, capillary     Status: Abnormal   Collection Time: 09/02/21  6:35 AM  Result Value Ref Range   Glucose-Capillary 124 (H) 70 - 99 mg/dL    Comment: Glucose reference range applies only to samples taken after fasting for at least 8 hours.  CBC     Status: Abnormal   Collection Time: 09/02/21  7:14 AM  Result Value Ref Range   WBC 5.0 4.0 - 10.5 K/uL   RBC 2.88 (L) 3.87 - 5.11 MIL/uL   Hemoglobin 8.6 (L) 12.0 - 15.0 g/dL   HCT 23.0 (L) 36.0 - 46.0 %   MCV 79.9 (L) 80.0 - 100.0 fL   MCH 29.9 26.0 - 34.0 pg   MCHC 37.4 (H) 30.0 - 36.0 g/dL   RDW 12.8 11.5 - 15.5 %   Platelets 170 150 - 400 K/uL   nRBC 0.0 0.0 - 0.2 %    Comment: Performed at [facility], [place] [address] [place], [place] [address]  Basic metabolic panel     Status: Abnormal   Collection Time: 09/02/21  7:14 AM  Result Value Ref Range   Sodium 130 (L) 135 - 145 mmol/L   Potassium 4.1 3.5 - 5.1 mmol/L   Chloride 96 (L) 98 - 111 mmol/L   CO2 23 22 - 32 mmol/L   Glucose, Bld 118 (H) 70 - 99 mg/dL    Comment: Glucose reference range applies only to samples taken after fasting for at least 8 hours.   BUN 59 (H) 8 - 23 mg/dL   Creatinine, Ser 5.45 (H) 0.44 - 1.00 mg/dL   Calcium 7.5 (L) 8.9 - 10.3 mg/dL   GFR, Estimated 8 (L) >60 mL/min    Comment: (NOTE) Calculated  using the CKD-EPI Creatinine Equation (2021)    Anion gap 11 5 - 15    Comment: Performed at [facility], [place] [address], [place], [place] [address]  Cortisol-am, blood     Status: Abnormal   Collection Time: 09/02/21  7:14 AM  Result Value Ref Range   Cortisol - AM 6.0 (L) 6.7 - 22.6 ug/dL    Comment: Performed at [facility] [address], [place], [place] [address]  Iron and TIBC     Status: None   Collection Time: 09/02/21  7:40 AM  Result Value Ref Range   Iron 62 28 - 170 ug/dL   TIBC 335  250 - 450 ug/dL   Saturation Ratios 19 10.4 - 31.8 %   UIBC 273 ug/dL    Comment: Performed at [facility], [place] [address] [place], [place] [address]  Ferritin     Status: None   Collection Time: 09/02/21  7:40 AM  Result Value Ref Range   Ferritin 55 11 - 307 ng/mL    Comment: Performed at [facility], [place] [address] [place], [place] [address]  Glucose, capillary     Status: Abnormal   Collection Time: 09/02/21 11:54 AM  Result Value Ref Range   Glucose-Capillary 154 (H) 70 - 99 mg/dL    Comment: Glucose reference range applies only to samples taken after fasting for at least 8 hours.     ROS:  A comprehensive review of systems was negative except for: Respiratory: positive for dyspnea on exertion-  orthopnea, PND, dysgeusia   Physical Exam: Vitals:   09/02/21 0510 09/02/21 0929  BP: (!) 174/77 (!) 158/64  Pulse: 70 64  Resp: 18 20  Temp: 98.6 F (37 C) 98.3 F (36.8 C)  SpO2: 100% 100%     General: alert, talkative-  slightly difficult to keep on task HEENT: PERRLA, EOMI, mucous membranes moist  Neck: no JVD Heart: RRR Lungs: mostly clear to auscultation Abdomen: soft, non tender Extremities: mild pitting edema  Skin: warm and dry  Neuro: alert, non focal   Assessment/Plan: 76 year old with a few medical issues including advanced CKD-  now presenting with orthopnea and anemia 1.Renal- advanced CKD at baseline which has been present and progressive.  She does not appear to be overly uremic but is difficult to tell- does she have true dysgeusia ?  But no N/V and albumin 3.4.  There are no overwhelming indications to start dialysis right now and I am not sure about indications for an AVG either as we do not want to place too soon ( is not a candidate for an AVF)  she is undecided of whether she wants to stay here or go to [place] where there is family but she is nervous about the medical facilities there.  If her renal function worsens as a result of diuresis  or if uremic sxms become more prominent-  we might get to a point of her needing to start dialysis sooner rather than later -  I will continue to follow  2. Hypertension/volume  - this seems to be her major complaint now.  She appears to be volume overloaded and having orthopnea and PND-  I have started iv lasix to help with this-  took norvasc down as well  3. Anemia  - this is also an issue that could be giving her sxms with hgb under 8.5-  I have ordered to give her iv iron  and ESA but this will take some time to work  4. Bones-  as an OP pth as been in the mid 200's -  has not required med 5. Dispo-  difficult situation-   she indicates she needs help at home and has thought about moving back to [place] where more family is there to help-  but she is not sure.  I will hope that will a better volume status and better hgb she might feel well enough to take care of herself    Louis Meckel 09/02/2021, 12:52 PM

## 2021-09-02 NOTE — Progress Notes (Signed)
Paula [name] is a 76 yo female with HFpEF (EF 54-49%, grade I diastolic dysfunction), E0FE, HTN, and CKD stage V pending dialysis initiation who presented with shortness of breath with CXR showing pulmonary vascular congestion and small left pleural effusion and BNP of 274. Subjective:  Overnight:NAEON  Short of breath when she lays flat or coughs. Denies any hiccups. States that her taste is messed up and that things do not taste the same. Denies any confusion. She states urinates about 4-5 times per day with a large volume of urine.    Objective:  Vital signs in last 24 hours: Afebrile, HR 60-90s, hypertensive in 150s-170s/70s. Respiration rate normal. Satting well on RA.  Vitals:   09/01/21 1716 09/01/21 1739 09/01/21 2007 09/02/21 0510  BP: (!) 147/73 (!) 169/71 (!) 167/72 (!) 174/77  Pulse: 66 66 74 70  Resp: 18 20 15 18   Temp: 98.9 F (37.2 C) 98.5 F (36.9 C) 98.7 F (37.1 C) 98.6 F (37 C)  TempSrc: Oral Oral Oral Oral  SpO2: 100% 100% 100% 100%  Weight:  71.7 kg    Height:  5\' 2"  (1.575 m)     Supplemental O2: Room Air SpO2: 100 % Filed Weights   09/01/21 1739  Weight: 71.7 kg    Intake/Output Summary (Last 24 hours) at 09/02/2021 0557 Last data filed at 09/01/2021 2200 Gross per 24 hour  Intake 360 ml  Output 0 ml  Net 360 ml  Urine occurrence x2 Net IO Since Admission: 360 mL [09/02/21 0557]  Physical Exam: General: NAD Mouth: MMM Neck: No JVD present CV: Normal heart sounds, 2+ radial and pedal pulses, 1+ LE edema present Lungs: CTAB Abdomen: Normal bowel sounds, Not TTP Neuro: Alert and oriented x4 Psych: Normal mood and normal affect  Assessment/Plan: Paula [name] is a 76 yo female with HFpEF (EF 07-12%, grade I diastolic dysfunction), R9XJ, HTN, and CKD stage V pending dialysis initiation who presented with shortness of breath with CXR showing pulmonary vascular congestion and small left pleural effusion and BNP of 274.  Principal  Problem:   Acute renal failure (HCC) Active Problems:   Type 2 diabetes mellitus without complication, without long-term current use of insulin (HCC)   Hyperlipidemia   Essential hypertension   Chronic diastolic congestive heart failure (HCC)   Chronic rhinitis  #Acute on chronic kidney disease stage V #Hyponatremia #HFpEF Patient has CKD stage V and is well-established with [facility]. She stated she thought she was CKD IV but now being told it is CKD V. Baseline Cr appears to be around 3.5-4, with GFR of 13, however, today, Cr elevated to 5.2 with GFR of 8.  BNP elevated at 274. Hyponatremia improving with fluid restriction and appears 2/2 to low solute intake. Urinalysis shows glucosuria and proteinuria. Protein: Creatinine ratio elevated at 8.14. TSH wnl. Renal ultrasound showed increased renal echogenicity in bilateral kidneys when compared to 02/2021 suggesting advancement of renal disesae. 1. 4 cm cyst on the left kidney and 1.3 cm cyst on the right kidney. On exam, patient does not have any rales present on lung auscultation but does have 1+ trace edema to suggest some hypervolemia. Still making urine but no output recorded. Potassium and bicarb level within normal limits and there are no urgent indications for dialysis at this time.  - Continue 1200 mL fluid restriction, should be able to increase her daily fluid intake with adequate solute intake.   - Strict I/Os - Continue Coreg 12.5 mg bid  and imdur 60 mg daily  - Nephrology consult pending for dialysis regarding dialysis initiation.  - RD consult pending for dietary recommendations  Microcytic Anemia Hgb around 9. MCV 79. Suspect this may be secondary to her advanced CKD but given microcytic nature, will order iron panel and ferritin to better characterize her anemia.  -Iron panel and ferritin pending; iron 62 and ferritin 55. Tsat <20. -Oral iron replacement with 325 mg iron daily given her CKD.  #Hypertension Chronic.  RAS, primary hyperaldosteronism ruled out previously. On amlodipine 10 mg, coreg 12.5 mg bid, and imdur 60 mg daily at home. Recent BP 150s/60s. - Continue home meds   #Type 2 diabetes Last A1c was 6.9 in October. Patient notes that she is on Glipizide 5 mg, but does not take this daily. She states she only takes it as needed, when her sugar is high. Will consult RD for assessment of nutrition requirements/status. Will consider insulin depending on her A1c results.  - A1c pending - RD consult pending - SSI with meals and qhs   #Social determinants of health Patient has difficulty accessing foods, as she is not able to drive, nor is she able to complete her IADLs independently. She notes that she is not able to cook for herself much and she has difficulty getting to the grocery store, although she can sometimes catch an [employer] ride. She does not have any help at home and would like to speak to a Education officer, museum. - TOC/Social work consulted, appreciate assistance - PT/OT consult placed and they recommend home health PT/OT and HH aid.   Code:Full VTE: Heparin Diet: Renal/carb modified Fluids: None Prior to Admission Living Arrangement: Home Anticipated Discharge Location: TBD Barriers to Discharge: Medical workup Dispo: Anticipated discharge in approximately 2 day(s).   Paula Schuller, MD Tillie Rung. [facility] Internal Medicine Residency, PGY-1  Pager: [phone] After 5 pm and on weekends: Please call the on-call pager

## 2021-09-02 NOTE — Progress Notes (Signed)
Initial Nutrition Assessment  DOCUMENTATION CODES:   Not applicable  INTERVENTION:  -Nepro Shake po daily, each supplement provides 425 kcal and 19 grams protein  NUTRITION DIAGNOSIS:   Inadequate oral intake related to social / environmental circumstances as evidenced by per patient/family report.  GOAL:   Patient will meet greater than or equal to 90% of their needs  MONITOR:   PO intake, Supplement acceptance, Labs, Weight trends, I & O's  REASON FOR ASSESSMENT:   Consult Assessment of nutrition requirement/status  ASSESSMENT:   Pt with PMH significant for HFpEF (EF 01-77%, grade I diastolic dysfunction), L3JQ, HTN, and CKD stage V pending dialysis initiation who presented with shortness of breath with CXR showing pulmonary vascular congestion and small left pleural effusion and BNP of 274  Pt unavailable at time of RD visit x 2 attempts. Per H&P, pt c/o altered taste. She does state that she has mostly been eating rice and bread/toast, as she has difficulty cooking for herself. Her appetite is good, however, she just has difficulty accessing foods and getting to the grocery store often. She denies any recent weight loss/weight gain. RD will follow-up tomorrow (or as early as schedule will allow) to attempt to obtain NFPE given pt appears to be suffering from some component of food insecurity.   Weight history reviewed. No significant weight changes noted. In fact, pt appears to have gained 8.97% body weight over 1 month (currently weighs 71.7 kg but documented as 65.8 kg on 07/12/21).   PO Intake: 50-100% x 3 recorded meals (66% avg meal intake)  UOP: 3x unmeasured occurrences x24 hours I/O: +554ml since admit  Medications: SSI TID w/ meals and bedtime Labs: Recent Labs  Lab 09/01/21 0952 09/01/21 1041 09/02/21 0300 09/02/21 0714  NA  --  123* 128* 130*  K  --  4.2 4.6 4.1  CL  --  91* 97* 96*  CO2  --  20* 23 23  BUN  --  58* 66* 59*  CREATININE  --  5.29*  5.37* 5.45*  CALCIUM  --  7.2* 7.3* 7.5*  MG  --  2.0  --   --   PHOS 4.4  --   --   --   GLUCOSE  --  263* 161* 118*   CBGs: 124-182 x24 hours    NUTRITION - FOCUSED PHYSICAL EXAM: Unable to perform at this time. Will attempt at follow-up.   Diet Order:   Diet Order             Diet renal with fluid restriction Fluid restriction: 1200 mL Fluid; Room service appropriate? Yes; Fluid consistency: Thin  Diet effective now                   EDUCATION NEEDS:   No education needs have been identified at this time  Skin:  Skin Assessment: Reviewed RN Assessment  Last BM:  1/22  Height:   Ht Readings from Last 1 Encounters:  09/01/21 5\' 2"  (1.575 m)    Weight:   Wt Readings from Last 1 Encounters:  09/01/21 71.7 kg   BMI:  Body mass index is 28.91 kg/m.  Estimated Nutritional Needs:   Kcal:  1600-1800  Protein:  75-85 grams  Fluid:  >1.6L/d     Theone Stanley., MS, RD, LDN (she/her/hers) RD pager number and weekend/on-call pager number located in [place].
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
## 2021-09-02 NOTE — Evaluation (Signed)
Occupational Therapy Evaluation Patient Details Name: Paula [name] MRN: [id number] DOB: [birth year] Today's Date: 09/02/2021   History of Present Illness Pt is a 76 y.o. F who presents with SOB. Significant PMH: HFpEF, T2DM, HTN, and CKD stage V pending initiating dialysis.   Clinical Impression   Pt admitted with the above diagnosis and has the deficits outlined below. Pt would benefit from cont OT to increase independence with basic adls and adl transfers/iadls so pt can dc home safely with PRN assist from her daughter when available. Pt is very close to baseline with adls/iadls but has struggled at home with IADLS for sometime due to weakness and having to manage many steps to enter the apartment. Pt has spoken to landlord about moving to bottom floor but management has not been consistent at her complex.  Pt will be starting dialysis soon and will need assist with transportation.  Unsure pt will have the strength to get back up the stairs after the fatigue of dialysis. Pt also needs some outside assist with meal prep and cleaning.  Recommend pt and family follow up with Meals on Wheels to see if this is an option for meal assist and will talk to social work about transportation issues.  Did start the conversation with daughter about long term plans for her mother as she may start to require more assist.  Will continue to follow acutely for iadl tasks. Rec HHOT and a [place] for home skills.       Recommendations for follow up therapy are one component of a multi-disciplinary discharge planning process, led by the attending physician.  Recommendations may be updated based on patient status, additional functional criteria and insurance authorization.   Follow Up Recommendations  Home health OT and [facility]   Assistance Recommended at Discharge PRN  Patient can return home with the following A little help with bathing/dressing/bathroom;Assistance with cooking/housework;Assist for  transportation;Help with stairs or ramp for entrance    Functional Status Assessment  Patient has had a recent decline in their functional status and demonstrates the ability to make significant improvements in function in a reasonable and predictable amount of time.  Equipment Recommendations  None recommended by OT    Recommendations for Other Services       Precautions / Restrictions Precautions Precautions: Fall Precaution Comments: had a fall 8 months ago at home.  Lives in apartment complex not on main floor. Concerns for falls navigating steps. Restrictions Weight Bearing Restrictions: No Other Position/Activity Restrictions: states she does get dizzy at times and "blacks out" therefore she no longer drives      Mobility Bed Mobility Overal bed mobility: Needs Assistance             General bed mobility comments: pt up in chair on arrival    Transfers Overall transfer level: Needs assistance Equipment used: Rolling walker (2 wheels) Transfers: Sit to/from Stand, Bed to chair/wheelchair/BSC Sit to Stand: Min assist     Step pivot transfers: Min guard     General transfer comment: Min assist to power up on first attempt.  Off higher surface, pt was min guard with cues to push up with at least one hand of surface pt is leaving.      Balance Overall balance assessment: Needs assistance Sitting-balance support: Feet supported Sitting balance-Leahy Scale: Good     Standing balance support: Bilateral upper extremity supported, During functional activity, Reliant on assistive device for balance Standing balance-Leahy Scale: Poor Standing balance comment: Pt requires  walker or outside assist to safely remain standing.                           ADL either performed or assessed with clinical judgement   ADL Overall ADL's : Needs assistance/impaired Eating/Feeding: Independent;Sitting   Grooming: Supervision/safety;Wash/dry hands;Oral  care;Standing Grooming Details (indicate cue type and reason): Pt stood at sink to groom for appx 3 minutes. Pt states, when fatigued, she will often prop on the sink to groom.  PT has requirested rollator for pt at home which can be used to sit at sink if needed and conserve energy Upper Body Bathing: Set up;Sitting   Lower Body Bathing: Sit to/from stand;Min guard Lower Body Bathing Details (indicate cue type and reason): min guard when standing Upper Body Dressing : Set up;Sitting   Lower Body Dressing: Min guard;Sit to/from stand Lower Body Dressing Details (indicate cue type and reason): min guard when standing Toilet Transfer: Min guard;Ambulation;BSC/3in1;Regular Toilet;Rolling walker (2 wheels);Grab bars Toilet Transfer Details (indicate cue type and reason): Pt walked to bathroom with walker requiring only min guard and cue to reach back when sitting.  3:1 placed over commode. Toileting- Clothing Manipulation and Hygiene: Sit to/from stand;Min Child psychotherapist Details (indicate cue type and reason): discussed home transfer and need to get grab bar to replace holding to towel rack. Pt has tub shower chair. Functional mobility during ADLs: Min guard;Rolling walker (2 wheels) General ADL Comments: Pt appears to be very close to baseline with all basic adls at this time.  Feel there is an ongoing independence issue with IADLS specficially getting groceries, transportation to dr appts (and soon dialysis), cooking and cleaning.  Daughter assists pt to pay the bills.  Unsure how much daughter is physically available to help pt.  Works full time and states she cannot help with driving, cooking and often cannot help her with meals. Recommend following up meals on wheels.     Vision Baseline Vision/History: 1 Wears glasses Ability to See in Adequate Light: 0 Adequate Patient Visual Report: No change from baseline Vision Assessment?: No apparent visual deficits     Perception      Praxis      Pertinent Vitals/Pain Pain Assessment Pain Assessment: Faces Faces Pain Scale: Hurts a little bit Pain Location: back when getting on and off commode Pain Descriptors / Indicators: Discomfort Pain Intervention(s): Monitored during session     Hand Dominance Right   Extremity/Trunk Assessment Upper Extremity Assessment Upper Extremity Assessment: Overall WFL for tasks assessed   Lower Extremity Assessment Lower Extremity Assessment: Defer to PT evaluation RLE Deficits / Details: Hip flexion 2/5, knee extension 3+/5, ankle dorsiflexion 3/5 LLE Deficits / Details: Hip flexion 4/5, knee extension 5/5, ankle dorsiflexion 3/5   Cervical / Trunk Assessment Cervical / Trunk Assessment: Normal   Communication Communication Communication: No difficulties   Cognition Arousal/Alertness: Awake/alert Behavior During Therapy: WFL for tasks assessed/performed Overall Cognitive Status: Within Functional Limits for tasks assessed                                 General Comments: oriented and answering all questions appropriately. Difficult to understand at times only due to heavy accent     General Comments  Pt most limited by home issues that were present prior to admission. Talked to daughter briefly about long term plan for pt and pt  states she will return to [name] where family can care for her.  Do feel pt requires more assist at home and at [place] would be safer living on main floor of apartment complex.    Exercises     Shoulder Instructions      Home Living Family/patient expects to be discharged to:: Private residence Living Arrangements: Children Available Help at Discharge: Family;Available PRN/intermittently Type of Home: Apartment Home Access: Stairs to enter Entrance Stairs-Number of Steps: 2 full flights Entrance Stairs-Rails: Right Home Layout: One level     Bathroom Shower/Tub: Teacher, early years/pre: Standard Bathroom  Accessibility: Yes   Home Equipment: Shower seat;Toilet riser   Additional Comments: Pt has toilet riser with rails. Uses towel rack to hold to to get in and out of the tub/shower. Instructed family to talk to landlord about possible grab bar in place of holding to towel rack.      Prior Functioning/Environment Prior Level of Function : Needs assist             Mobility Comments: Pt with difficulty navigating stairs therefore somewhat confined to home and is to start dialysis soon. ADLs Comments: requires assist when "dizzy," but typically independent. Struggles with IADLS more due to fatigue.   Meal prep is challenging per pt.        OT Problem List: Impaired balance (sitting and/or standing);Decreased knowledge of use of DME or AE;Cardiopulmonary status limiting activity      OT Treatment/Interventions: Self-care/ADL training;Energy conservation;Therapeutic activities    OT Goals(Current goals can be found in the care plan section) Acute Rehab OT Goals Patient Stated Goal: to be able to manage stairs better and more easily cook in apartment OT Goal Formulation: With patient/family Time For Goal Achievement: 09/16/21 Potential to Achieve Goals: Good ADL Goals Additional ADL Goal #1: Pt wil walk to bathroom with walker and complete all toileting with mod I. Additional ADL Goal #2: Pt will gather all clothing and dress self with mod I and use of walker. Additional ADL Goal #3: Pt will transfer into tub with tub seat with supervision. Additional ADL Goal #4: Pt will state 2 things she can do at home to conserve energy during adls at home without cues.  OT Frequency: Min 2X/week    Co-evaluation              AM-PAC OT "6 Clicks" Daily Activity     Outcome Measure Help from another person eating meals?: None Help from another person taking care of personal grooming?: A Little Help from another person toileting, which includes using toliet, bedpan, or urinal?: A  Little Help from another person bathing (including washing, rinsing, drying)?: None Help from another person to put on and taking off regular upper body clothing?: None Help from another person to put on and taking off regular lower body clothing?: A Little 6 Click Score: 21   End of Session Equipment Utilized During Treatment: Oxygen;Rolling walker (2 wheels) Nurse Communication: Mobility status  Activity Tolerance: Patient tolerated treatment well;Other (comment) (somewhat limited by coughing) Patient left: in chair;with call bell/phone within reach;with chair alarm set;with family/visitor present  OT Visit Diagnosis: Unsteadiness on feet (R26.81)                Time: 6433-2951 OT Time Calculation (min): 30 min Charges:  OT General Charges $OT Visit: 1 Visit OT Evaluation $OT Eval Low Complexity: 1 Low OT Treatments $Self Care/Home Management : 8-22 mins  Glenford Peers 09/02/2021,  10:00 AM

## 2021-09-03 ENCOUNTER — Ambulatory Visit: Payer: Medicare Other | Admitting: Family Medicine

## 2021-09-03 ENCOUNTER — Encounter (HOSPITAL_COMMUNITY): Payer: Self-pay | Admitting: Internal Medicine

## 2021-09-03 DIAGNOSIS — E1122 Type 2 diabetes mellitus with diabetic chronic kidney disease: Secondary | ICD-10-CM | POA: Diagnosis not present

## 2021-09-03 DIAGNOSIS — N185 Chronic kidney disease, stage 5: Secondary | ICD-10-CM | POA: Diagnosis not present

## 2021-09-03 DIAGNOSIS — I5032 Chronic diastolic (congestive) heart failure: Secondary | ICD-10-CM | POA: Diagnosis not present

## 2021-09-03 LAB — RENAL FUNCTION PANEL
Albumin: 3.2 g/dL — ABNORMAL LOW (ref 3.5–5.0)
Anion gap: 11 (ref 5–15)
BUN: 60 mg/dL — ABNORMAL HIGH (ref 8–23)
CO2: 21 mmol/L — ABNORMAL LOW (ref 22–32)
Calcium: 7.2 mg/dL — ABNORMAL LOW (ref 8.9–10.3)
Chloride: 97 mmol/L — ABNORMAL LOW (ref 98–111)
Creatinine, Ser: 5.7 mg/dL — ABNORMAL HIGH (ref 0.44–1.00)
GFR, Estimated: 7 mL/min — ABNORMAL LOW (ref >60)
Glucose, Bld: 106 mg/dL — ABNORMAL HIGH (ref 70–99)
Phosphorus: 5.7 mg/dL — ABNORMAL HIGH (ref 2.5–4.6)
Potassium: 3.9 mmol/L (ref 3.5–5.1)
Sodium: 129 mmol/L — ABNORMAL LOW (ref 135–145)

## 2021-09-03 LAB — HEMOGLOBIN A1C
Hgb A1c MFr Bld: 7.4 % — ABNORMAL HIGH (ref 4.8–5.6)
Mean Plasma Glucose: 166 mg/dL

## 2021-09-03 LAB — GLUCOSE, CAPILLARY
Glucose-Capillary: 108 mg/dL — ABNORMAL HIGH (ref 70–99)
Glucose-Capillary: 190 mg/dL — ABNORMAL HIGH (ref 70–99)
Glucose-Capillary: 185 mg/dL — ABNORMAL HIGH (ref 70–99)
Glucose-Capillary: 140 mg/dL — ABNORMAL HIGH (ref 70–99)

## 2021-09-03 MED ORDER — CARBAMIDE PEROXIDE 6.5 % OT SOLN
5.0000 [drp] | Freq: Two times a day (BID) | OTIC | Status: DC
  Administered 2021-09-03 – 2021-09-05 (×3): 5 [drp] via OTIC
  Filled 2021-09-03: qty 15

## 2021-09-03 MED ORDER — ARTIFICIAL TEARS OPHTHALMIC OINT
TOPICAL_OINTMENT | OPHTHALMIC | Status: DC | PRN
  Filled 2021-09-03 (×2): qty 3.5

## 2021-09-03 MED ORDER — GUAIFENESIN-DM 100-10 MG/5ML PO SYRP
10.0000 mL | ORAL_SOLUTION | ORAL | Status: DC | PRN
  Administered 2021-09-03 – 2021-09-05 (×6): 10 mL via ORAL
  Filled 2021-09-03 (×6): qty 10

## 2021-09-03 MED ORDER — FLUTICASONE PROPIONATE 50 MCG/ACT NA SUSP: 1.0000 | Freq: Two times a day (BID) | NASAL | Status: DC

## 2021-09-03 MED ORDER — GUAIFENESIN-DM 100-10 MG/5ML PO SYRP: 10.0000 mL | ORAL_SOLUTION | Freq: Four times a day (QID) | ORAL | Status: DC | PRN

## 2021-09-03 NOTE — Plan of Care (Signed)
  Problem: Nutrition: Goal: Adequate nutrition will be maintained Outcome: Progressing   Problem: Elimination: Goal: Will not experience complications related to urinary retention Outcome: Progressing   

## 2021-09-03 NOTE — Progress Notes (Signed)
Physical Therapy Treatment Patient Details Name: Paula [name] MRN: [id number] DOB: Oct 01, 1945 Today's Date: 09/03/2021   History of Present Illness Pt is a 76 y.o. F who presents with SOB. Significant PMH: HFpEF, T2DM, HTN, and CKD stage V pending initiating dialysis.    PT Comments    Pt making excellent progress towards her physical therapy goals, exhibiting improved activity tolerance and ambulation distance. Pt ambulating 350 feet with a Rollator without physical assist, SpO2 96% on RA, HR 64-80's. D/c plan remains appropriate.     Recommendations for follow up therapy are one component of a multi-disciplinary discharge planning process, led by the attending physician.  Recommendations may be updated based on patient status, additional functional criteria and insurance authorization.  Follow Up Recommendations  Home health PT     Assistance Recommended at Discharge PRN  Patient can return home with the following A little help with walking and/or transfers;Assistance with cooking/housework;Assist for transportation;Help with stairs or ramp for entrance   Equipment Recommendations  Rollator (4 wheels)    Recommendations for Other Services       Precautions / Restrictions Precautions Precautions: Fall Restrictions Weight Bearing Restrictions: No     Mobility  Bed Mobility Overal bed mobility: Modified Independent                  Transfers Overall transfer level: Needs assistance Equipment used: Rollator (4 wheels), None Transfers: Sit to/from Stand Sit to Stand: Supervision           General transfer comment: No assist to power up with varying hand support    Ambulation/Gait Ambulation/Gait assistance: Supervision Gait Distance (Feet): 350 Feet Assistive device: Rollator (4 wheels) Gait Pattern/deviations: Step-through pattern, Decreased stride length, Narrow base of support Gait velocity: decreased     General Gait Details: slow and steady  pace   Chief Strategy Officer    Modified Rankin (Stroke Patients Only)       Balance Overall balance assessment: Needs assistance Sitting-balance support: Feet supported Sitting balance-Leahy Scale: Good Sitting balance - Comments: donning socks edge of bed without physical assist   Standing balance support: During functional activity, No upper extremity supported Standing balance-Leahy Scale: Fair                              Cognition Arousal/Alertness: Awake/alert Behavior During Therapy: WFL for tasks assessed/performed Overall Cognitive Status: Within Functional Limits for tasks assessed                                          Exercises Other Exercises Other Exercises: x5 serial sit to stands    General Comments        Pertinent Vitals/Pain Pain Assessment Pain Assessment: Faces Faces Pain Scale: Hurts a little bit Pain Location: low back Pain Descriptors / Indicators: Discomfort Pain Intervention(s): Monitored during session    Home Living                          Prior Function            PT Goals (current goals can now be found in the care plan section) Acute Rehab PT Goals Patient Stated Goal: return to independent PT Goal Formulation: With patient Time For  Goal Achievement: 09/16/21 Potential to Achieve Goals: Good Progress towards PT goals: Progressing toward goals    Frequency    Min 3X/week      PT Plan Current plan remains appropriate    Co-evaluation              AM-PAC PT "6 Clicks" Mobility   Outcome Measure  Help needed turning from your back to your side while in a flat bed without using bedrails?: None Help needed moving from lying on your back to sitting on the side of a flat bed without using bedrails?: None Help needed moving to and from a bed to a chair (including a wheelchair)?: A Little Help needed standing up from a chair using your arms (e.g.,  wheelchair or bedside chair)?: A Little Help needed to walk in hospital room?: A Little Help needed climbing 3-5 steps with a railing? : A Little 6 Click Score: 20    End of Session Equipment Utilized During Treatment: Gait belt Activity Tolerance: Patient tolerated treatment well Patient left: in chair;with call bell/phone within reach Nurse Communication: Mobility status PT Visit Diagnosis: Unsteadiness on feet (R26.81);Muscle weakness (generalized) (M62.81)     Time: 4982-6415 PT Time Calculation (min) (ACUTE ONLY): 22 min  Charges:  $Therapeutic Activity: 8-22 mins                     Wyona Almas, PT, DPT Acute Rehabilitation Services Pager [phone] Office [phone]    Deno Etienne 09/03/2021, 4:29 PM

## 2021-09-03 NOTE — Progress Notes (Signed)
Paula [name] is a 76 yo female with HFpEF (EF 38-10%, grade I diastolic dysfunction), F7PZ, HTN, and CKD stage V pending dialysis initiation who presented with shortness of breath with CXR showing pulmonary vascular congestion and small left pleural effusion and BNP of 274. Subjective:  Overnight:NAEON  Stated was doing well overall. Still had difficulty breathing when laying flat but breathing and leg swelling improved. Stated her cough was bothering her the most and she feels like there is drainage in her throat and something blocking her nostrils.   Objective:  Vital signs in last 24 hours: Afebrile, HR 60-90s, hypertensive in 150s-160s/70s. Respiration rate normal. Satting well on [place]  Vitals:   09/02/21 0929 09/02/21 1556 09/02/21 2102 09/03/21 0510  BP: (!) 158/64 (!) 152/63 (!) 164/77 (!) 151/69  Pulse: 64 69 66 67  Resp: 20 20 19 18   Temp: 98.3 F (36.8 C) 98.1 F (36.7 C) 98.8 F (37.1 C) 98.1 F (36.7 C)  TempSrc: Oral Oral  Oral  SpO2: 100% 100% 100% 100%  Weight:      Height:       Supplemental O2: Nasal cannula SpO2: 100 % Filed Weights   09/01/21 1739  Weight: 71.7 kg    Intake/Output Summary (Last 24 hours) at 09/03/2021 0706 Last data filed at 09/03/2021 0510 Gross per 24 hour  Intake 420 ml  Output 1400 ml  Net -980 ml  Urine occurrence x3 Net IO Since Admission: -620 mL [09/03/21 0706]  Physical Exam:  General: NAD HENT: MMM, bilateral nose shows edematous turbinates Neck: No JVD present CV: Normal heart sounds, 2+ radial and pedal pulses, Trace LE edema present Lungs: CTAB Abdomen: Normal bowel sounds, Not TTP Neuro: Alert and oriented x4 Psych: Normal mood and normal affect  Assessment/Plan: Paula [name] is a 76 yo female with HFpEF (EF 02-58%, grade I diastolic dysfunction), N2DP, HTN, and CKD stage V pending dialysis initiation who presented with shortness of breath with CXR showing pulmonary vascular congestion and small left  pleural effusion and BNP of 274.  Principal Problem:   Acute renal failure (HCC) Active Problems:   Type 2 diabetes mellitus without complication, without long-term current use of insulin (HCC)   Hyperlipidemia   Essential hypertension   Chronic diastolic congestive heart failure (HCC)   Chronic rhinitis   Chronic kidney disease, stage 5 (HCC)   Hyponatremia   #Acute on chronic kidney disease stage V #HFpEF  #Acute hypoxic respiratory failure (resolved) #Hyponatremia (improving) Output of 1400 cc yesterday with net -980 cc after IV Lasix 80 mg yesterday. Lasix 80 mg given again this am. Sodium stable. Creatinine and BUN stable. Nephrology recommend IV diuresis for volume overload. No intention to start HD or place access now. She is diuresing well with IV lasix. Will evaluate volume status before giving IV lasix.  - Increase fluid restriction to 2000 mL to avoid AKI. - Strict I/Os, daily weights - Continue Coreg 12.5 mg bid and imdur 60 mg daily  - Nephro shakes daily - Daily renal fxn panel    Microcytic Anemia Hgb around 9. MCV 79. Suspect this may be secondary to her advanced CKD but given microcytic nature, will order iron panel and ferritin to better characterize her anemia.  -Iron panel and ferritin pending; iron 62 and ferritin 55. Tsat <20. -IV iron ferric gluconate 250 mg x4 doses (dose 2/4) -Aranesp 150 mcg injection q weekly (last dose 09/02/21)  #Hypertension Chronic. RAS, primary hyperaldosteronism ruled out previously. Amlodipine 10  mg decreased to 5 mg in setting of IV diuresis, coreg 12.5 mg bid, and imdur 60 mg daily at home. Recent BP 150s/60s. - Continue stated meds   #Type 2 diabetes Last A1c was 6.9 in October. Patient notes that she is on Glipizide 5 mg, but does not take this daily. She states she only takes it as needed, when her sugar is high. SSI was started but patient refused insulin stating she had severe hypoglycemia episode previously and will not  get take insulin again. A1c 7.4. -Carb modified diet -CTM with CBGs   #Social determinants of health Her inability to take care of her self appears to be significantly contributing to her symptoms. She states she has no support at home. - TOC/Social work consulted, appreciate assistance - PT/OT consult placed and they recommend home health PT/OT and HH aid.   Code:Full VTE: Heparin Diet: Renal/carb modified Fluids: None Prior to Admission Living Arrangement: Home Anticipated Discharge Location: TBD Barriers to Discharge: Medical workup Dispo: Anticipated discharge in approximately 1 day(s).   [name], MD Tillie Rung. [facility] Internal Medicine Residency, PGY-1  Pager: [phone] After 5 pm and on weekends: Please call the on-call pager

## 2021-09-03 NOTE — Progress Notes (Signed)
Subjective:  1400 of urine yest-  1250 so far today -  kidney function poor but stable -  she feels about the same-  is thinking that she needs some kind of ENT procedure to help her nasal passages ???  Objective Vital signs in last 24 hours: Vitals:   09/02/21 2102 09/03/21 0510 09/03/21 0958 09/03/21 1004  BP: (!) 164/77 (!) 151/69 (!) 165/66 (!) 165/66  Pulse: 66 67 64 68  Resp: 19 18  18   Temp: 98.8 F (37.1 C) 98.1 F (36.7 C)  98.3 F (36.8 C)  TempSrc:  Oral  Oral  SpO2: 100% 100%  99%  Weight:      Height:       Weight change:   Intake/Output Summary (Last 24 hours) at 09/03/2021 1148 Last data filed at 09/03/2021 1048 Gross per 24 hour  Intake 600 ml  Output 2650 ml  Net -2050 ml    Assessment/Plan: 76 year old with a few medical issues including advanced CKD-  now presenting with orthopnea and anemia 1.Renal- advanced CKD at baseline which has been present and progressive.  She does not appear to be overly uremic but is difficult to tell- does she have true dysgeusia ?  But no N/V and albumin 3.4.  There are no overwhelming indications to start dialysis right now and I am not sure about indications for an AVG either as we do not want to place too soon ( is not a candidate for an AVF)  she is undecided of whether she wants to stay here or go to [place] where there is family but she is nervous about the medical facilities there.  If her renal function worsens as a result of diuresis or if uremic sxms become more prominent-  we might get to a point of her needing to start dialysis sooner rather than later -  I will continue to follow  2. Hypertension/volume  - this seems to be her major complaint now.  She appears to be volume overloaded and having orthopnea and PND-  I have started iv lasix to help with this-  took norvasc down as well to assist with diuresis 3. Anemia  - this is also an issue that could be giving her sxms with hgb under 8.5-  I have ordered to give her iv iron and  ESA but this will take some time to work  4. Bones-  as an OP pth as been in the mid 200's -  has not required med 5. Dispo-  difficult situation-   she indicates she needs help at home and has thought about moving back to [place] where more family is there to help-  but she is not sure.  I will hope that will a better volume status and better hgb she might feel well enough to take care of herself -  PT and OT on board   Louis Meckel    Labs: Basic Metabolic Panel: Recent Labs  Lab 09/01/21 0952 09/01/21 1041 09/02/21 0300 09/02/21 0714 09/03/21 0440  NA  --    < > 128* 130* 129*  K  --    < > 4.6 4.1 3.9  CL  --    < > 97* 96* 97*  CO2  --    < > 23 23 21*  GLUCOSE  --    < > 161* 118* 106*  BUN  --    < > 66* 59* 60*  CREATININE  --    < >  5.37* 5.45* 5.70*  CALCIUM  --    < > 7.3* 7.5* 7.2*  PHOS 4.4  --   --   --  5.7*   < > = values in this interval not displayed.   Liver Function Tests: Recent Labs  Lab 09/01/21 0641 09/03/21 0440  AST 23  --   ALT 26  --   ALKPHOS 121  --   BILITOT 0.5  --   PROT 6.5  --   ALBUMIN 3.4* 3.2*   No results for input(s): LIPASE, AMYLASE in the last 168 hours. No results for input(s): AMMONIA in the last 168 hours. CBC: Recent Labs  Lab 09/01/21 0641 09/02/21 0714  WBC 5.3 5.0  NEUTROABS 3.7  --   HGB 9.1* 8.6*  HCT 24.9* 23.0*  MCV 80.1 79.9*  PLT 186 170   Cardiac Enzymes: No results for input(s): CKTOTAL, CKMB, CKMBINDEX, TROPONINI in the last 168 hours. CBG: Recent Labs  Lab 09/02/21 0635 09/02/21 1154 09/02/21 1613 09/02/21 2102 09/03/21 0651  GLUCAP 124* 154* 143* 127* 108*    Iron Studies:  Recent Labs    09/02/21 0740  IRON 62  TIBC 335  FERRITIN 55   Studies/Results: No results found. Medications: Infusions:  ferric gluconate (FERRLECIT) IVPB 250 mg (09/03/21 0955)    Scheduled Medications:  amLODipine  5 mg Oral Daily   aspirin EC  81 mg Oral Daily   atorvastatin  40 mg Oral  Daily   carvedilol  12.5 mg Oral BID WC   darbepoetin (ARANESP) injection - NON-DIALYSIS  150 mcg Subcutaneous Q Mon-1800   feeding supplement (NEPRO CARB STEADY)  237 mL Oral Q24H   fluticasone  2 spray Each Nare Daily   furosemide  80 mg Intravenous Q12H   heparin  5,000 Units Subcutaneous Q8H   insulin aspart  0-15 Units Subcutaneous TID WC   insulin aspart  0-5 Units Subcutaneous QHS   isosorbide mononitrate  60 mg Oral Daily   loratadine  10 mg Oral Daily    have reviewed scheduled and prn medications.  Physical Exam: General:  alert, talkative-  NAD Heart: RRR Lungs: dec BS at bases Abdomen: soft, non tender Extremities:trace to 1+ edema     09/03/2021,11:48 AM  LOS: 2 days

## 2021-09-03 NOTE — TOC Initial Note (Signed)
Transition of Care [facility]) - Initial/Assessment Note    Patient Details  Name: Paula [name] MRN: [id number] Date of Birth: Jul 25, 1946  [facility]) CM/SW Contact:    Tom-Johnson, Renea Ee, RN Phone Number: 09/03/2021, 4:49 PM  Clinical Narrative:                  CM spoke with patient at bedside about needs for post hospital transition. Patient lives alone and has three living adult children and two deceased children. Retired Education officer, museum. Admitted for ARF. On Iron infusion and lasix. Not starting dialysis at this time. Patient does not drive, uses [facility] transportation to [facility] appointments and uses [facility] for errands. PCP is Charlott Rakes, MD and uses [facility]. PT/OT recommended home health and list of agencies given to patient and she chose [facility]. Referral made with Amy and acceptance voiced. Information on AVS. Rollator ordered from [employer] and Freda Munro to deliver at bedside. CM will continue to follow with needs.  Expected Discharge Plan: [place] Barriers to Discharge: Continued Medical Work up   Patient Goals and CMS Choice Patient states their goals for this hospitalization and ongoing recovery are:: To return home CMS Medicare.gov Compare Post Acute Care list provided to:: Patient Choice offered to / list presented to : Patient, Adult Children (Daughter)  Expected Discharge Plan and Services Expected Discharge Plan: [place]   Discharge Planning Services: CM Consult Post Acute Care Choice: [place] arrangements for the past 2 months: Apartment                 DME Arranged: Walker rolling with seat DME Agency: [employer] Date DME Agency Contacted: 09/03/21 Time DME Agency Contacted: [phone] Representative spoke with at DME Agency: Freda Munro HH Arranged: PT, OT, Nurse's Aide [place] Agency: [place] Date [place]: 09/03/21 Time [place]: 26 Representative  spoke with at [place] Arrangements/Services Living arrangements for the past 2 months: [place] with:: Self Patient language and need for interpreter reviewed:: Yes Do you feel safe going back to the place where you live?: Yes      Need for Family Participation in Patient Care: Yes (Comment) Care giver support system in place?: Yes (comment) Current home services: DME Sales executive chair) Criminal Activity/Legal Involvement Pertinent to Current Situation/Hospitalization: No - Comment as needed  Activities of Daily Living Home Assistive Devices/Equipment: None ADL Screening (condition at time of admission) Patient's cognitive ability adequate to safely complete daily activities?: Yes Is the patient deaf or have difficulty hearing?: No Does the patient have difficulty seeing, even when wearing glasses/contacts?: No Does the patient have difficulty concentrating, remembering, or making decisions?: No Patient able to express need for assistance with ADLs?: Yes Does the patient have difficulty dressing or bathing?: No Independently performs ADLs?: Yes (appropriate for developmental age) Does the patient have difficulty walking or climbing stairs?: No Weakness of Legs: Both Weakness of Arms/Hands: None  Permission Sought/Granted Permission sought to share information with : Case Manager, Family Supports, Chartered certified accountant granted to share information with : Yes, Verbal Permission Granted              Emotional Assessment Appearance:: Appears stated age Attitude/Demeanor/Rapport: Engaged, Gracious Affect (typically observed): Accepting, Appropriate, Calm, Hopeful Orientation: : Oriented to Self, Oriented to Place, Oriented to  Time, Oriented to Situation Alcohol / Substance Use: Not Applicable Psych Involvement: No (comment)  Admission diagnosis:  Shortness of breath [R06.02] Hyponatremia [E87.1] Acute renal failure (HCC)  [N17.9] AKI (acute kidney injury) ([place]) [N17.9] Patient Active Problem List   Diagnosis Date Noted   Chronic kidney disease, stage 5 ([place])    Hyponatremia    Acute renal failure ([place]) 09/01/2021   Aortic atherosclerosis ([place]) 06/24/2021   Hypertensive emergency 02/22/2021   Hypertensive urgency 02/21/2021   Syncope 02/21/2021   Gait disturbance 02/21/2021   Jaw pain 02/21/2021   Elevated troponin 02/21/2021   Upper airway cough syndrome 10/12/2017   Chronic rhinitis 10/01/2017   Seasonal allergic conjunctivitis 10/01/2017   Aneurysm ([place]) 95/04/3266   Acute metabolic encephalopathy 12/45/8099   URI (upper respiratory infection) 09/02/2017   Facial weakness 09/02/2017   Benign essential HTN 09/02/2017   Allergic rhinitis 08/20/2017   Chronic pain 05/07/2017   Palpitations 07/10/2016   Insomnia 03/25/2016   Gout 01/23/2016   History of cataract 02/27/2015   Seasonal and perennial allergic rhinitis 02/15/2012   Cough 11/04/2011   Diabetic neuropathy (HCC) 10/11/2011   Chronic diastolic congestive heart failure ([place]) 10/11/2011   CKD (chronic kidney disease) stage 3, GFR 30-59 ml/min (HCC) 10/10/2011   GERD (gastroesophageal reflux disease)    Chest pain 07/08/2010   Type 2 diabetes mellitus without complication, without long-term current use of insulin ([place]) 07/03/2010   Hyperlipidemia 07/03/2010   Essential hypertension 07/03/2010   PCP:  Charlott Rakes, MD Pharmacy:   [facility] [address] [place] [address] Phone: [phone] Fax: [phone]     Social Determinants of Health (SDOH) Interventions    Readmission Risk Interventions No flowsheet data found.

## 2021-09-04 DIAGNOSIS — E1122 Type 2 diabetes mellitus with diabetic chronic kidney disease: Secondary | ICD-10-CM | POA: Diagnosis not present

## 2021-09-04 DIAGNOSIS — N185 Chronic kidney disease, stage 5: Secondary | ICD-10-CM | POA: Diagnosis not present

## 2021-09-04 DIAGNOSIS — R058 Other specified cough: Secondary | ICD-10-CM

## 2021-09-04 DIAGNOSIS — E871 Hypo-osmolality and hyponatremia: Secondary | ICD-10-CM | POA: Diagnosis not present

## 2021-09-04 DIAGNOSIS — I129 Hypertensive chronic kidney disease with stage 1 through stage 4 chronic kidney disease, or unspecified chronic kidney disease: Secondary | ICD-10-CM | POA: Diagnosis not present

## 2021-09-04 LAB — RENAL FUNCTION PANEL
Albumin: 3.2 g/dL — ABNORMAL LOW (ref 3.5–5.0)
Anion gap: 11 (ref 5–15)
BUN: 65 mg/dL — ABNORMAL HIGH (ref 8–23)
CO2: 23 mmol/L (ref 22–32)
Calcium: 7.6 mg/dL — ABNORMAL LOW (ref 8.9–10.3)
Chloride: 95 mmol/L — ABNORMAL LOW (ref 98–111)
Creatinine, Ser: 5.78 mg/dL — ABNORMAL HIGH (ref 0.44–1.00)
GFR, Estimated: 7 mL/min — ABNORMAL LOW (ref >60)
Glucose, Bld: 130 mg/dL — ABNORMAL HIGH (ref 70–99)
Phosphorus: 5.4 mg/dL — ABNORMAL HIGH (ref 2.5–4.6)
Potassium: 4.1 mmol/L (ref 3.5–5.1)
Sodium: 129 mmol/L — ABNORMAL LOW (ref 135–145)

## 2021-09-04 LAB — GLUCOSE, CAPILLARY
Glucose-Capillary: 103 mg/dL — ABNORMAL HIGH (ref 70–99)
Glucose-Capillary: 153 mg/dL — ABNORMAL HIGH (ref 70–99)
Glucose-Capillary: 165 mg/dL — ABNORMAL HIGH (ref 70–99)
Glucose-Capillary: 137 mg/dL — ABNORMAL HIGH (ref 70–99)

## 2021-09-04 MED ORDER — POLYETHYLENE GLYCOL 3350 17 G PO PACK
17.0000 g | PACK | Freq: Every day | ORAL | Status: DC
  Filled 2021-09-04 (×2): qty 1

## 2021-09-04 MED ORDER — FUROSEMIDE 40 MG PO TABS
80.0000 mg | ORAL_TABLET | Freq: Two times a day (BID) | ORAL | Status: DC
  Administered 2021-09-04 – 2021-09-05 (×2): 80 mg via ORAL
  Filled 2021-09-04 (×2): qty 2

## 2021-09-04 NOTE — Progress Notes (Signed)
Subjective:  3800 of UOP -  kidney function poor but stable -  she feels better-  is thinking that she needs some kind of ENT procedure to help her nasal passages -  has OP appt tomorrow  Objective Vital signs in last 24 hours: Vitals:   09/03/21 2102 09/04/21 0457 09/04/21 0814 09/04/21 0855  BP: (!) 156/71 135/86  (!) 157/60  Pulse: 68 67  69  Resp: 18 18  18   Temp: 98.7 F (37.1 C) 98.8 F (37.1 C)  98.3 F (36.8 C)  TempSrc: Oral Oral  Oral  SpO2: 100%   100%  Weight:   65.8 kg   Height:       Weight change:   Intake/Output Summary (Last 24 hours) at 09/04/2021 1208 Last data filed at 09/04/2021 0850 Gross per 24 hour  Intake 960 ml  Output 2550 ml  Net -1590 ml    Assessment/Plan: 76 year old with a few medical issues including advanced CKD-  now presenting with orthopnea and anemia 1.Renal- advanced CKD at baseline which has been present and progressive.  She does not appear to be overly uremic but is difficult to tell- does she have true dysgeusia ?  But no N/V and albumin 3.4.  There are no overwhelming indications to start dialysis right now and I am not sure about indications for an AVG either as we do not want to place too soon ( is not a candidate for an AVF)    If her renal function worsens as a result of diuresis or if uremic sxms become more prominent-  we might get to a point of her needing to start dialysis sooner rather than later -  she has done well and feels better with diuresis-   2. Hypertension/volume  - this seems to be her major complaint now.  She appears to be volume overloaded and having orthopnea and PND-  I  started iv lasix to help with this-  took norvasc down as well to assist with diuresis-  will change her diuretics to oral and would be comfortable sending her home with close nephrology follow up to fine tune her diuretics -  she might even be able to make her ENT appt tomorrow  3. Anemia  - this is also an issue that could be giving her sxms with hgb  under 8.5-  I have ordered to give her iv iron and ESA but this will take some time to work  4. Bones-  as an OP pth as been in the mid 200's -  has not required med 5. Dispo-    I was hoping  that with a better volume status and better hgb she might feel well enough to take care of herself -  PT and OT on board-  she does feel better-  does not object to discharge   [place]: Basic Metabolic Panel: Recent Labs  Lab 09/01/21 0952 09/01/21 1041 09/02/21 0714 09/03/21 0440 09/04/21 0230  NA  --    < > 130* 129* 129*  K  --    < > 4.1 3.9 4.1  CL  --    < > 96* 97* 95*  CO2  --    < > 23 21* 23  GLUCOSE  --    < > 118* 106* 130*  BUN  --    < > 59* 60* 65*  CREATININE  --    < > 5.45* 5.70* 5.78*  CALCIUM  --    < > 7.5* 7.2* 7.6*  PHOS 4.4  --   --  5.7* 5.4*   < > = values in this interval not displayed.   Liver Function Tests: Recent Labs  Lab 09/01/21 0641 09/03/21 0440 09/04/21 0230  AST 23  --   --   ALT 26  --   --   ALKPHOS 121  --   --   BILITOT 0.5  --   --   PROT 6.5  --   --   ALBUMIN 3.4* 3.2* 3.2*   No results for input(s): LIPASE, AMYLASE in the last 168 hours. No results for input(s): AMMONIA in the last 168 hours. CBC: Recent Labs  Lab 09/01/21 0641 09/02/21 0714  WBC 5.3 5.0  NEUTROABS 3.7  --   HGB 9.1* 8.6*  HCT 24.9* 23.0*  MCV 80.1 79.9*  PLT 186 170   Cardiac Enzymes: No results for input(s): CKTOTAL, CKMB, CKMBINDEX, TROPONINI in the last 168 hours. CBG: Recent Labs  Lab 09/03/21 1139 09/03/21 1653 09/03/21 2102 09/04/21 0644 09/04/21 1202  GLUCAP 190* 185* 140* 103* 153*    Iron Studies:  Recent Labs    09/02/21 0740  IRON 62  TIBC 335  FERRITIN 55   Studies/Results: No results found. Medications: Infusions:  ferric gluconate (FERRLECIT) IVPB 250 mg (09/04/21 1048)    Scheduled Medications:  amLODipine  5 mg Oral Daily   aspirin EC  81 mg Oral Daily   atorvastatin  40 mg Oral Daily    carbamide peroxide  5 drop Both EARS BID   carvedilol  12.5 mg Oral BID WC   darbepoetin (ARANESP) injection - NON-DIALYSIS  150 mcg Subcutaneous Q Mon-1800   feeding supplement (NEPRO CARB STEADY)  237 mL Oral Q24H   fluticasone  2 spray Each Nare Daily   heparin  5,000 Units Subcutaneous Q8H   isosorbide mononitrate  60 mg Oral Daily   loratadine  10 mg Oral Daily   polyethylene glycol  17 g Oral Daily    have reviewed scheduled and prn medications.  Physical Exam: General:  alert, talkative-  NAD Heart: RRR Lungs: dec BS at bases Abdomen: soft, non tender Extremities:trace  edema - improved     09/04/2021,12:08 PM  LOS: 3 days

## 2021-09-04 NOTE — Progress Notes (Addendum)
Paula [name] is a 76 yo female with HFpEF (EF 96-78%, grade I diastolic dysfunction), L3YB, HTN, and CKD stage V pending dialysis initiation who presented with shortness of breath with CXR showing pulmonary vascular congestion and small left pleural effusion and BNP of 274. Subjective:  Overnight:NAEON  States she was doing well. Her breathing improved but she is unable to lie flat. Her coughing improved since yesterday and she was able to get some rest.   Objective:   Vitals:   09/03/21 1004 09/03/21 1655 09/03/21 2102 09/04/21 0457  BP: (!) 165/66 (!) 150/73 (!) 156/71 135/86  Pulse: 68 65 68 67  Resp: 18 18 18 18   Temp: 98.3 F (36.8 C) 97.9 F (36.6 C) 98.7 F (37.1 C) 98.8 F (37.1 C)  TempSrc: Oral Oral Oral Oral  SpO2: 99% 100% 100%   Weight:      Height:       Supplemental O2: Nasal cannula SpO2: 100 % Filed Weights   09/01/21 1739  Weight: 71.7 kg    Intake/Output Summary (Last 24 hours) at 09/04/2021 0175 Last data filed at 09/04/2021 0500 Gross per 24 hour  Intake 1200 ml  Output 3800 ml  Net -2600 ml  Urine occurrence x3 Net IO Since Admission: -3,220 mL [09/04/21 0626]  Physical Exam:  General: NAD HENT: MMM, ear examination showed bulging tympanic membrane on the right, normal on the left side  Neck: No JVD present CV: Normal heart sounds, 2+ radial and pedal pulses, No LE edema Lungs: CTAB Abdomen: Normal bowel sounds, Not TTP Neuro: Alert and oriented x4 Psych: Normal mood and normal affect  Assessment/Plan: Paula [name] is a 76 yo female with HFpEF (EF 10-25%, grade I diastolic dysfunction), E5ID, HTN, and CKD stage V pending dialysis initiation who presented with shortness of breath with CXR showing pulmonary vascular congestion and small left pleural effusion and BNP of 274.  Principal Problem:   Acute renal failure (HCC) Active Problems:   Type 2 diabetes mellitus without complication, without long-term current use of insulin  (HCC)   Hyperlipidemia   Essential hypertension   Chronic diastolic congestive heart failure (HCC)   Seasonal and perennial allergic rhinitis   Chronic rhinitis   Upper airway cough syndrome   Chronic kidney disease, stage 5 (HCC)   Hyponatremia    #HFpEF in setting chronic kidney disease stage V #Hyponatremia (improving) Output of 3800 cc yesterday with net -2600 cc after IV Lasix 80 mg. IV Lasix 80 mg given x4. Sodium stable. Creatinine and BUN stable. Nephrology recommended IV diuresis for volume overload. No intention to start HD or place access now. She is diuresed well with IV lasix. She appears euvolemic on current evaluation. Weight on admission 71.7 kg to 65.8 kg this am. Will hold IV lasix currently. May consider patient on oral diuretics but will await further recommendations by nephrology.  - Increase fluid restriction to 2000 mL to avoid AKI. - Strict I/Os, daily weights - Continue Coreg 12.5 mg bid and imdur 60 mg daily  - Nephro shakes daily - Daily renal fxn panel  Upper Airway Cough Syndrome Chronic. Patient follows with ENT and has an appointment with them tomorrow. Patient on claritin, flonase and robitussin DM started yesterday. States cough is improved since starting that. -Continue Claritin 10 mg, flonase daily, and robitussin DM  Microcytic Anemia Hgb around 9. MCV 79.  -Iron panel and ferritin pending; iron 62 and ferritin 55. Tsat <20. -IV iron ferric gluconate 250 mg  x4 doses (dose 3/4) -Aranesp 150 mcg injection q weekly (last dose 09/02/21)  #Hypertension Chronic. Improved with diuresis. BP in 130s/80s. RAS, primary hyperaldosteronism ruled out previously. Amlodipine 10 mg decreased to 5 mg in setting of IV diuresis, coreg 12.5 mg bid, and imdur 60 mg daily at home.  BP 130s/80s this am. - Continue stated meds   #Type 2 diabetes A1c 7.4. Most CBGs less than 160 with few greater than that. CBG 103 this am.  -Carb modified diet -CTM with CBGs    #Social determinants of health Her inability to take care of her self appears to be significantly contributing to her symptoms. She states she has no support at home. - TOC/Social work consulted, appreciate assistance - PT/OT consult placed and they recommend home health PT/OT and HH aid.   Code:Full VTE: Heparin Diet: Renal/carb modified Fluids: None Prior to Admission Living Arrangement: Home Anticipated Discharge Location: Home Barriers to Discharge: Medical workup Dispo: Anticipated discharge in approximately 0-1 day(s).   [name], MD Tillie Rung. [facility] Internal Medicine Residency, PGY-1  Pager: [phone] After 5 pm and on weekends: Please call the on-call pager

## 2021-09-04 NOTE — Plan of Care (Signed)
  Problem: Elimination: Goal: Will not experience complications related to bowel motility Outcome: Progressing   Problem: Elimination: Goal: Will not experience complications related to urinary retention Outcome: Progressing   

## 2021-09-04 NOTE — Discharge Summary (Addendum)
Name: Paula [name] MRN: [id number] DOB: [birth year] 76 y.o. PCP: Charlott Rakes, MD  Date of Admission: 09/01/2021  6:08 AM Date of Discharge: 09/05/21 Attending Physician: Lucious Groves, DO  Discharge Diagnosis: Principal Problem:   Chronic kidney disease, stage 5 ([place]) Active Problems:   Type 2 diabetes mellitus without complication, without long-term current use of insulin (HCC)   Hyperlipidemia   Essential hypertension   Acute on chronic diastolic (congestive) heart failure (HCC)   Seasonal and perennial allergic rhinitis   Chronic rhinitis   Upper airway cough syndrome   Hyponatremia   Discharge Medications: Allergies as of 09/05/2021       Reactions   Dust Mite Extract Cough   Ace Inhibitors Cough   Lovastatin Other (See Comments)   Generalized body pain        Medication List     TAKE these medications    Accu-Chek Guide test strip Generic drug: glucose blood Use as instructed 3 times daily   acetaminophen 500 MG tablet Commonly known as: TYLENOL Take 500 mg by mouth every 6 (six) hours as needed for fever, moderate pain or mild pain.   albuterol 108 (90 Base) MCG/ACT inhaler Commonly known as: VENTOLIN HFA Inhale 1-2 puffs into the lungs every 6 (six) hours as needed. What changed: reasons to take this   amLODipine 5 MG tablet Commonly known as: NORVASC Take 1 tablet (5 mg total) by mouth daily. Start taking on: September 06, 2021 What changed:  medication strength how much to take   aspirin EC 81 MG tablet Take 81 mg by mouth every evening.   atorvastatin 80 MG tablet Commonly known as: LIPITOR Take 1 tablet (80 mg total) by mouth daily.   calcitRIOL 0.25 MCG capsule Commonly known as: ROCALTROL Take 0.25 mcg by mouth daily.   carvedilol 12.5 MG tablet Commonly known as: COREG Take 1 tablet (12.5 mg total) by mouth 2 (two) times daily with a meal.   Darbepoetin Alfa 150 MCG/0.3ML Sosy injection Commonly known as:  ARANESP Inject 0.3 mLs (150 mcg total) into the skin every Monday at 6 PM. Start taking on: September 09, 2021   fluticasone 50 MCG/ACT nasal spray Commonly known as: FLONASE Place 2 sprays into both nostrils daily.   furosemide 80 MG tablet Commonly known as: LASIX Take 1 tablet (80 mg total) by mouth 2 (two) times daily.   glipiZIDE 5 MG tablet Commonly known as: GLUCOTROL Take 0.5 tablets (2.5 mg total) by mouth daily before breakfast.   guaiFENesin-dextromethorphan 100-10 MG/5ML syrup Commonly known as: ROBITUSSIN DM Take 10 mLs by mouth every 4 (four) hours as needed for cough.   isosorbide mononitrate 60 MG 24 hr tablet Commonly known as: IMDUR TAKE 1 TABLET (60 MG TOTAL) BY MOUTH DAILY. What changed: how much to take   loratadine 10 MG tablet Commonly known as: CLARITIN Take 1 tablet (10 mg total) by mouth daily.   Misc. Devices [employer].  Diagnosis - unstable gait               Durable Medical Equipment  (From admission, onward)           Start     Ordered   09/05/21 1041  DME Walker  Once       Question Answer Comment  Walker: With 5 Inch Wheels   Patient needs a walker to treat with the following condition Physical deconditioning      09/05/21 1046   09/03/21 1703  For home use only DME 4 wheeled rolling walker with seat  Once       Question:  Patient needs a walker to treat with the following condition  Answer:  Gait instability   09/03/21 1703            Disposition and follow-up:   Paula [name] was discharged from [facility] in Stable condition.  At the hospital follow up visit please address:  HFpEF: Patient had significant volume overload. Diureses with around net -4L. Patient discharged with Lasix 80 mg BID per nephrology recommendations. Ensure patient has follow up with nephrology as they planned to titrate her lasix.  CKD V: Seen by nephrology while inpatient. Ensure patient has follow up with  nephrology.  Hypertension: Amlodipine decreased to 5 mg due to addition of lasix. Titrate as needed.  Microcytic Anemia: Patient had iron deficiency anemia. Repeat iron panel and ferritin replenish as indicated.  Diabetes: Patient has diabetes, A1c inpatient was 7.4. She takes glipizide for her diabetes and states she misses days. Insulin was considered by not started inpatient as patient stated it makes her hypoglycemic out of proportion to units. Start insulin due to worsening renal fxn if indicated. HLD: Patient on lipitor 80 mg and lipid panel showed LDL of 48. Decrease to 40 mg if appropriate to prevent myalgias.   2.  Labs / imaging needed at time of follow-up: CBC, BMP, Iron panel, Ferritin  3.  Pending labs/ test needing follow-up: None  Follow-up Appointments:  Follow-up Information     [facility] Follow up.   Specialty: Rehabilitation Why: Someone will call you to schedule first home visit. Contact information: [place] [place] [phone]        Charlott Rakes, MD. Call today.   Specialty: Family Medicine Why: Please call and make a follow up appointment with your primary care provider. Contact information: [place] [address] [phone]         Edrick Oh, MD. Call today.   Specialty: Nephrology Why: Please call to confirm follow up appointment. The nephrology should schedule you for a 2 week follow up but call and see if that is arranged. Contact information: [place] [address] [phone]                 Hospital Course by problem list: HPI per Dr. Raymondo Band "History of Present Illness: Paula [name] is a 76 yo female with HFpEF (EF 71-24%, grade I diastolic dysfunction), P8KD, HTN, and CKD stage V pending dialysis initiation who presented with shortness of breath.   The patient states that around 5am this morning, she felt short  of breath all of a sudden while laying down. She states that she could not catch her breath, nor could she speak at this time. The patient states that she has felt short of breath before, but it has never gotten as bad as it felt today. She called EMS at this time and received albuterol and atrovent en route to the hospital, which she states significantly helped her breathing. At this time, she does still feel short of breath, however, she feels much better already. She also endorses a productive cough, although she notes this has been chronic for the last 30 years. Cough produces a clear phlegm, which is not new. She denies any fevers, chills, blurry vision, lightheadedness, chest pain, abd pain, n/v/d, dysuria, or hematuria. She denies any recent illnesses  or sick contacts. She does state that she has mostly been eating rice and bread/toast, as she has difficulty cooking for herself. Her appetite is good, however, she just has difficulty accessing foods and getting to the grocery store often. She denies any recent weight loss/weight gain."   ED course: CBC with Hb of 9.1, no leukocytosis. CMP with hyponatremia at 123, elevated Cr at 5.2 and GFR of 8 (6 months ago was 3.6 and GFR of 13), albumin 3.4 and corrected calcium of 7.8. Trops negative. Respiratory panel neg for COVID or flu. CXR with small left sided pleural effusion and some pulmonary vascular congestion. IMTS called for admission.     #Acute on chronic HFpEF in setting chronic kidney disease stage V #Acute hypoxic respiratory failure (resolved) #Hyponatremia (improving) Patient presented in respiratory distress, stating that she could not catch her breath at home and noting that she could not speak. Of note, she had PFTs performed in 2019 with normal lung function/no evidence of COPD or asthma. Patient found to be volume overloaded with LE edema, and elevated BNP. Nephrology was consulted for her worsening kidney fxn and recommended IV diuresis.  It was given 4x with net -3,220 cc. IV Lasix 80 mg given x4. This was switched to oral lasix 80 mg BID and patient had further output of 1.8 liter with net negative -1L. Hyponatremia appeared mixed with poor solute intake and volume overload. It improved initially with fluid restriction and remained stable with diuresis. Creatinine and BUN stable. Nephrology did not have intention to start HD or place access now. Repeat echo was not done as she had an echo in July 2022 with EF of 65-70%, no regional wall motion abnormalities, severe LVH, and grade I diastolic dysfunction. Patient was continued on coreg 12.5 mg bid and imdur 60 mg daily while inpatient.    #Hypertension On amlodipine 10 mg, coreg 12.5 mg bid, and imdur 60 mg daily at home. SBP significantly elevated upon arrival to the ED, in the 200s. Home meds were continued but norvasc was decreased to 5 mg with initiation of IV diuresis. BP improved with IV diuresis. Discharged with Norvasc 5mg , Lasix 80 mg BID, coreg 12.5 mg BID, and IMDUR 60 mg daily.  Microcytic Anemia Hgb around 9. MCV 79. Suspect this may be secondary to her advanced CKD but given microcytic nature, iron panel and ferritin were ordered to better characterize her anemia. And showed iron 62 and ferritin 55. Tsat <20. IV iron ferric gluconate 250 mg x3 doses were given. Aranesp 150 mcg injection q weekly (last dose 09/02/21 while inpatient)   #Upper Airway Cough Syndrome #Chronic rhinitis Chronic. Patient follows with ENT and has an appointment with them on 09/05/21. Patient on claritin, flonase.  Robitussin DM started while inpatient. States cough improved since starting that. Claritin 10 mg, flonase daily, and robitussin DM were given during the hospital. Short course of robitussin was given at discharge with advise for close follow up with ENT to resolve underlying etiology.   Hyperlipidemia  Chronic. Home med lipitor 80 mg was continued while inpatient. Lipid panel showed  cholesterol 106, LDL 48.   #Type 2 diabetes Last A1c was 6.9 in October. Patient notes that she is on Glipizide 5 mg, but does not take this daily. She states she only takes it as needed, when her sugar is high. A1c in the hospital was 7.4. SSI was started but discontinued as patient said she had a hypoglycemic episode with that. Majority of CBG were <  150s while inpatient.   #Social determinants of health Patient endorsed difficulty accessing foods, as she is not able to drive, nor is she able to complete her IADLs independently. She notes that she is not able to cook for herself much and she has difficulty getting to the grocery store, although she can sometimes catch an [place] ride. She did not have any help at home. Social work was asked to help patient. Home health aid was arranged. Along with home PT/OT.   Discharge Subjective: She states she is doing well. Her IV infiltrated and she is having pain the her right arm. Denied any other acute concerns. Discharge Exam:   BP 138/66    Pulse 63    Temp 97.7 F (36.5 C) (Oral)    Resp 16    Ht 5\' 2"  (1.575 m)    Wt 65.8 kg    LMP  (LMP Unknown)    SpO2 99%    BMI 26.53 kg/m   Physical Exam:  General: NAD HENT: MMM  Neck: No JVD present CV: Normal heart sounds, 2+ radial and pedal pulses, No LE edema Lungs: CTAB Abdomen: Normal bowel sounds, Not TTP Neuro: Alert and oriented x4 Psych: Normal mood and normal affect   Pertinent Labs, Studies, and Procedures:  CBC Latest Ref Rng & Units 09/02/2021 09/01/2021 02/23/2021  WBC 4.0 - 10.5 K/uL 5.0 5.3 5.9  Hemoglobin 12.0 - 15.0 g/dL 8.6(L) 9.1(L) 9.9(L)  Hematocrit 36.0 - 46.0 % 23.0(L) 24.9(L) 26.8(L)  Platelets 150 - 400 K/uL 170 186 184    CMP Latest Ref Rng & Units 09/05/2021 09/04/2021 09/03/2021  Glucose 70 - 99 mg/dL 113(H) 130(H) 106(H)  BUN 8 - 23 mg/dL 65(H) 65(H) 60(H)  Creatinine 0.44 - 1.00 mg/dL 5.91(H) 5.78(H) 5.70(H)  Sodium 135 - 145 mmol/L 130(L) 129(L) 129(L)  Potassium 3.5 -  5.1 mmol/L 4.0 4.1 3.9  Chloride 98 - 111 mmol/L 97(L) 95(L) 97(L)  CO2 22 - 32 mmol/L 23 23 21(L)  Calcium 8.9 - 10.3 mg/dL 8.0(L) 7.6(L) 7.2(L)  Total Protein 6.5 - 8.1 g/dL - - -  Total Bilirubin 0.3 - 1.2 mg/dL - - -  Alkaline Phos 38 - 126 U/L - - -  AST 15 - 41 U/L - - -  ALT 0 - 44 U/L - - -    Iron/TIBC/Ferritin/ %Sat    Component Value Date/Time   IRON 62 09/02/2021 0740   IRON 44 01/04/2021 1153   TIBC 335 09/02/2021 0740   TIBC 277 01/04/2021 1153   FERRITIN 55 09/02/2021 0740   FERRITIN 170 (H) 01/04/2021 1153   IRONPCTSAT 19 09/02/2021 0740   IRONPCTSAT 16 01/04/2021 1153    Lipid Panel     Component Value Date/Time   CHOL 106 09/01/2021 1041   CHOL 121 06/17/2021 0904   TRIG 61 09/01/2021 1041   HDL 46 09/01/2021 1041   HDL 46 06/17/2021 0904   CHOLHDL 2.3 09/01/2021 1041   VLDL 12 09/01/2021 1041   LDLCALC 48 09/01/2021 1041   LDLCALC 61 06/17/2021 0904   LABVLDL 14 06/17/2021 0904    Protein:Creatinine ratio: 8.14  BNP: 274  DG Chest 2 View  Result Date: 09/01/2021 CLINICAL DATA:  76 year old female with shortness of breath and chest tightness. EXAM: CHEST - 2 VIEW COMPARISON:  Portable chest 02/21/2021 and earlier. FINDINGS: AP and lateral views of the chest. Blunting of the left costophrenic angle compatible with small pleural effusion. Mediastinal contours remain within normal limits. Visualized tracheal air  column is within normal limits. No pneumothorax or consolidation. Pulmonary vascular congestion appears increased although no overt pulmonary edema is identified. No acute osseous abnormality identified. Paucity of bowel gas in the upper abdomen. IMPRESSION: Small left pleural effusion and increased pulmonary vascular congestion. Consider mild or developing interstitial edema. Electronically Signed   By: Genevie Ann M.D.   On: 09/01/2021 07:22   US RENAL  Result Date: 09/01/2021 CLINICAL DATA:  Acute renal failure EXAM: RENAL / URINARY TRACT ULTRASOUND  COMPLETE COMPARISON:  Renal ultrasound 02/21/2021 FINDINGS: Right Kidney: Renal measurements: 7.9 x 3.5 x 4.2 cm = volume: 61.4 mL. Normal renal cortical thickness. Increased renal cortical echogenicity. No hydronephrosis. There is a 1.3 cm cyst within the superior pole. Left Kidney: Renal measurements: 8.0 x 3.8 x 3.9 cm = volume: 62.1 mL. Normal renal cortical thickness. Increased renal cortical echogenicity. No hydronephrosis. There is a 1.4 cm cyst within the left kidney. Bladder: Appears normal for degree of bladder distention. Other: None. IMPRESSION: Increased renal cortical echogenicity as can be seen with chronic medical renal disease. No hydronephrosis. Electronically Signed   By: Lovey Newcomer M.D.   On: 09/01/2021 10:42     Discharge Instructions: Discharge Instructions     Call MD for:  difficulty breathing, headache or visual disturbances   Complete by: As directed    Call MD for:  extreme fatigue   Complete by: As directed    Call MD for:  hives   Complete by: As directed    Call MD for:  persistant dizziness or light-headedness   Complete by: As directed    Call MD for:  persistant nausea and vomiting   Complete by: As directed    Call MD for:  redness, tenderness, or signs of infection (pain, swelling, redness, odor or green/yellow discharge around incision site)   Complete by: As directed    Call MD for:  severe uncontrolled pain   Complete by: As directed    Call MD for:  temperature >100.4   Complete by: As directed    Diet - low sodium heart healthy   Complete by: As directed    Increase activity slowly   Complete by: As directed       Dear Ms. [place],  [place] presented to the ED with shortness of breath and were found to have too much fluid. You were given lasix and we were able to remove a lot of the fluid off. Your kidney function is very low and you see nephrology outpatient, we had a nephrologist come and see you in the hospital. They do not want to start  dialysis yet as you are still making urine but would like for you to continue seeing them in the outpatient setting.   We are discharging you with oral Lasix 80 mg BID, and try to limit your fluids to 1.5 L which is 3-16 oz water bottles or 48 oz total.  We will give you short course of robitussin dm but please check with your ENT doctor for better options for your cough. This medicine provides only symptomatic treatment and we want to attempt to fix the underlying cause. You have an appointment with them this afternoon.   Please follow up with them, your primary care doctor and your nephrologist.   It was a pleasure taking care of you    Signed: [name], MD Tillie Rung. [facility] Internal Medicine Residency, PGY-1  09/05/2021, 10:48 AM   Pager: [phone]

## 2021-09-05 ENCOUNTER — Other Ambulatory Visit (HOSPITAL_COMMUNITY): Payer: Self-pay

## 2021-09-05 DIAGNOSIS — J302 Other seasonal allergic rhinitis: Secondary | ICD-10-CM

## 2021-09-05 DIAGNOSIS — J343 Hypertrophy of nasal turbinates: Secondary | ICD-10-CM

## 2021-09-05 DIAGNOSIS — N185 Chronic kidney disease, stage 5: Secondary | ICD-10-CM | POA: Diagnosis not present

## 2021-09-05 DIAGNOSIS — E1122 Type 2 diabetes mellitus with diabetic chronic kidney disease: Secondary | ICD-10-CM | POA: Diagnosis not present

## 2021-09-05 DIAGNOSIS — J3089 Other allergic rhinitis: Secondary | ICD-10-CM | POA: Diagnosis not present

## 2021-09-05 DIAGNOSIS — E785 Hyperlipidemia, unspecified: Secondary | ICD-10-CM

## 2021-09-05 HISTORY — DX: Hypertrophy of nasal turbinates: J34.3

## 2021-09-05 LAB — RENAL FUNCTION PANEL
Albumin: 3.2 g/dL — ABNORMAL LOW (ref 3.5–5.0)
Anion gap: 10 (ref 5–15)
BUN: 65 mg/dL — ABNORMAL HIGH (ref 8–23)
CO2: 23 mmol/L (ref 22–32)
Calcium: 8 mg/dL — ABNORMAL LOW (ref 8.9–10.3)
Chloride: 97 mmol/L — ABNORMAL LOW (ref 98–111)
Creatinine, Ser: 5.91 mg/dL — ABNORMAL HIGH (ref 0.44–1.00)
GFR, Estimated: 7 mL/min — ABNORMAL LOW (ref >60)
Glucose, Bld: 113 mg/dL — ABNORMAL HIGH (ref 70–99)
Phosphorus: 4.6 mg/dL (ref 2.5–4.6)
Potassium: 4 mmol/L (ref 3.5–5.1)
Sodium: 130 mmol/L — ABNORMAL LOW (ref 135–145)

## 2021-09-05 LAB — GLUCOSE, CAPILLARY
Glucose-Capillary: 96 mg/dL (ref 70–99)
Glucose-Capillary: 154 mg/dL — ABNORMAL HIGH (ref 70–99)

## 2021-09-05 MED ORDER — GUAIFENESIN-DM 100-10 MG/5ML PO SYRP
10.0000 mL | ORAL_SOLUTION | ORAL | 0 refills | Status: DC | PRN
  Filled 2021-09-05: qty 118, 2d supply, fill #0

## 2021-09-05 MED ORDER — FUROSEMIDE 40 MG PO TABS
80.0000 mg | ORAL_TABLET | Freq: Two times a day (BID) | ORAL | 0 refills | Status: DC
  Filled 2021-09-05: qty 120, 30d supply, fill #0

## 2021-09-05 MED ORDER — AMLODIPINE BESYLATE 5 MG PO TABS
5.0000 mg | ORAL_TABLET | Freq: Every day | ORAL | 0 refills | Status: DC
  Filled 2021-09-05: qty 30, 30d supply, fill #0

## 2021-09-05 MED ORDER — DARBEPOETIN ALFA 150 MCG/0.3ML IJ SOSY: 150.0000 ug | PREFILLED_SYRINGE | INTRAMUSCULAR | Status: DC

## 2021-09-05 NOTE — Progress Notes (Signed)
Subjective:  1800 of UOP -  kidney function poor but stable -  she feels better-  plan is for discharge today   Objective Vital signs in last 24 hours: Vitals:   09/04/21 0855 09/04/21 1635 09/04/21 2108 09/05/21 0421  BP: (!) 157/60 (!) 155/63 (!) 146/62 (!) 153/68  Pulse: 69 62 66 68  Resp: 18 18  19   Temp: 98.3 F (36.8 C) 98.7 F (37.1 C) 98.4 F (36.9 C) 97.7 F (36.5 C)  TempSrc: Oral Oral Oral Oral  SpO2: 100% 98% 100% 98%  Weight:      Height:       Weight change:   Intake/Output Summary (Last 24 hours) at 09/05/2021 0956 Last data filed at 09/05/2021 0272 Gross per 24 hour  Intake 1530 ml  Output 1850 ml  Net -320 ml    Assessment/Plan: 76 year old with a few medical issues including advanced CKD-  now presenting with orthopnea and anemia 1.Renal- advanced CKD at baseline which has been present and progressive.  She does not appear to be overly uremic but is difficult to tell- does she have true dysgeusia ?  But no N/V and albumin 3.4.  There are no overwhelming indications to start dialysis right now and I am not sure about indications for an AVG either as we do not want to place too soon ( is not a candidate for an AVF)    If her renal function worsens as a result of diuresis or if uremic sxms become more prominent-  we might get to a point of her needing to start dialysis sooner rather than later -  she has done well and feels better with diuresis -  plan is for discharge and OP follow up  2. Hypertension/volume  - this seems to be her major complaint now.  She did appear to be volume overloaded and having orthopnea and PND-  I  started iv lasix to help with this-  took norvasc down as well to assist with diuresis-  changed her diuretics to oral - lasix 80 bid and would be comfortable sending her home with close nephrology follow up to fine tune her diuretics -   3. Anemia  - this is also an issue that could be giving her sxms with hgb under 8.5-  I have ordered to give her  iv iron and ESA here but this will take some time to work  4. Bones-  as an OP pth as been in the mid 200's -  has not required med 5. Dispo-    I was hoping  that with a better volume status and better hgb she might feel well enough to take care of herself -  PT and OT -  she does feel better-  does not object to discharge   [place]: Basic Metabolic Panel: Recent Labs  Lab 09/03/21 0440 09/04/21 0230 09/05/21 0339  NA 129* 129* 130*  K 3.9 4.1 4.0  CL 97* 95* 97*  CO2 21* 23 23  GLUCOSE 106* 130* 113*  BUN 60* 65* 65*  CREATININE 5.70* 5.78* 5.91*  CALCIUM 7.2* 7.6* 8.0*  PHOS 5.7* 5.4* 4.6   Liver Function Tests: Recent Labs  Lab 09/01/21 0641 09/03/21 0440 09/04/21 0230 09/05/21 0339  AST 23  --   --   --   ALT 26  --   --   --   ALKPHOS 121  --   --   --  BILITOT 0.5  --   --   --   PROT 6.5  --   --   --   ALBUMIN 3.4* 3.2* 3.2* 3.2*   No results for input(s): LIPASE, AMYLASE in the last 168 hours. No results for input(s): AMMONIA in the last 168 hours. CBC: Recent Labs  Lab 09/01/21 0641 09/02/21 0714  WBC 5.3 5.0  NEUTROABS 3.7  --   HGB 9.1* 8.6*  HCT 24.9* 23.0*  MCV 80.1 79.9*  PLT 186 170   Cardiac Enzymes: No results for input(s): CKTOTAL, CKMB, CKMBINDEX, TROPONINI in the last 168 hours. CBG: Recent Labs  Lab 09/04/21 0644 09/04/21 1202 09/04/21 1633 09/04/21 2104 09/05/21 0725  GLUCAP 103* 153* 165* 137* 96    Iron Studies:  No results for input(s): IRON, TIBC, TRANSFERRIN, FERRITIN in the last 72 hours.  Studies/Results: No results found. Medications: Infusions:  ferric gluconate (FERRLECIT) IVPB 135 mL/hr at 09/05/21 0806    Scheduled Medications:  amLODipine  5 mg Oral Daily   aspirin EC  81 mg Oral Daily   atorvastatin  40 mg Oral Daily   carbamide peroxide  5 drop Both EARS BID   carvedilol  12.5 mg Oral BID WC   darbepoetin (ARANESP) injection - NON-DIALYSIS  150 mcg Subcutaneous Q Mon-1800    feeding supplement (NEPRO CARB STEADY)  237 mL Oral Q24H   fluticasone  2 spray Each Nare Daily   furosemide  80 mg Oral BID   heparin  5,000 Units Subcutaneous Q8H   isosorbide mononitrate  60 mg Oral Daily   loratadine  10 mg Oral Daily   polyethylene glycol  17 g Oral Daily    have reviewed scheduled and prn medications.  Physical Exam: General:  alert, talkative-  NAD Heart: RRR Lungs: dec BS at bases Abdomen: soft, non tender Extremities:trace  edema - improved     09/05/2021,9:56 AM  LOS: 4 days

## 2021-09-05 NOTE — Progress Notes (Signed)
Occupational Therapy Treatment Patient Details Name: Paula [name] MRN: [id number] DOB: January 26, 1946 Today's Date: 09/05/2021   History of present illness Pt is a 76 y.o. F who presents with SOB. Significant PMH: HFpEF, T2DM, HTN, and CKD stage V pending initiating dialysis.   OT comments  Pt complete LB dressing long sitting in bed with set up for socks, transfers with supervision and room level mobility with FW with ming guard. Pt o2 on room air remained in 97-99%. Pt currently with functional limitations due to the deficits listed below (see OT Problem List).  Pt will benefit from skilled OT to increase their safety and independence with ADL and functional mobility for ADL to facilitate discharge to venue listed below.     Recommendations for follow up therapy are one component of a multi-disciplinary discharge planning process, led by the attending physician.  Recommendations may be updated based on patient status, additional functional criteria and insurance authorization.    Follow Up Recommendations  Home health OT    Assistance Recommended at Discharge PRN  Patient can return home with the following  A little help with bathing/dressing/bathroom;Assistance with cooking/housework;Assist for transportation;Help with stairs or ramp for entrance   Equipment Recommendations  None recommended by OT    Recommendations for Other Services      Precautions / Restrictions Precautions Precautions: Fall Precaution Comments: had a fall 8 months ago at home.  Lives in apartment complex not on main floor. Concerns for falls navigating steps. Restrictions Weight Bearing Restrictions: No Other Position/Activity Restrictions: states she does get dizzy at times and "blacks out" therefore she no longer drives       Mobility Bed Mobility Overal bed mobility: Modified Independent Bed Mobility: Supine to Sit           General bed mobility comments: bed at about 90 degree angle, decline  to lower due to coughing    Transfers Overall transfer level: Needs assistance Equipment used: None, Rolling walker (2 wheels) Transfers: Sit to/from Stand Sit to Stand: Supervision                 Balance Overall balance assessment: Needs assistance Sitting-balance support: Feet supported Sitting balance-Leahy Scale: Good     Standing balance support: No upper extremity supported, Bilateral upper extremity supported Standing balance-Leahy Scale: Fair                             ADL either performed or assessed with clinical judgement   ADL Overall ADL's : Needs assistance/impaired Eating/Feeding: Independent;Sitting   Grooming: Supervision/safety;Wash/dry hands;Oral care;Standing   Upper Body Bathing: Set up;Sitting   Lower Body Bathing: Min guard;Cueing for safety;Cueing for sequencing;Bed level (long sitting)   Upper Body Dressing : Set up;Sitting   Lower Body Dressing: Min guard;Sit to/from stand   Toilet Transfer: Min guard;Cueing for safety;Cueing for sequencing;Rolling walker (2 wheels)           Functional mobility during ADLs: Min guard;Cueing for safety;Cueing for sequencing;Rolling walker (2 wheels)      Extremity/Trunk Assessment Upper Extremity Assessment Upper Extremity Assessment: Overall WFL for tasks assessed   Lower Extremity Assessment Lower Extremity Assessment: Defer to PT evaluation        Vision   Vision Assessment?: No apparent visual deficits   Perception     Praxis      Cognition Arousal/Alertness: Awake/alert Behavior During Therapy: WFL for tasks assessed/performed Overall Cognitive Status: Within Functional Limits for tasks  assessed                                 General Comments: oriented and answering all questions appropriately. Difficult to understand at times only due to heavy accent        Exercises      Shoulder Instructions       General Comments      Pertinent Vitals/  Pain       Pain Assessment Pain Assessment: 0-10 Pain Score: 2  Pain Location: R arm where IV was Pain Descriptors / Indicators: Discomfort Pain Intervention(s): Monitored during session  Home Living                                          Prior Functioning/Environment              Frequency  Min 2X/week        Progress Toward Goals  OT Goals(current goals can now be found in the care plan section)  Progress towards OT goals: Progressing toward goals  Acute Rehab OT Goals Patient Stated Goal: to go home today maybe OT Goal Formulation: With patient/family Time For Goal Achievement: 09/16/21 Potential to Achieve Goals: Good ADL Goals Additional ADL Goal #1: Pt wil walk to bathroom with walker and complete all toileting with mod I. Additional ADL Goal #2: Pt will gather all clothing and dress self with mod I and use of walker. Additional ADL Goal #3: Pt will transfer into tub with tub seat with supervision. Additional ADL Goal #4: Pt will state 2 things she can do at home to conserve energy during adls at home without cues.  Plan Discharge plan remains appropriate    Co-evaluation                 AM-PAC OT "6 Clicks" Daily Activity     Outcome Measure   Help from another person eating meals?: None Help from another person taking care of personal grooming?: A Little Help from another person toileting, which includes using toliet, bedpan, or urinal?: A Little Help from another person bathing (including washing, rinsing, drying)?: None Help from another person to put on and taking off regular upper body clothing?: None Help from another person to put on and taking off regular lower body clothing?: A Little 6 Click Score: 21    End of Session Equipment Utilized During Treatment: Rolling walker (2 wheels)  OT Visit Diagnosis: Unsteadiness on feet (R26.81)   Activity Tolerance Patient tolerated treatment well (however did not want to  complete to much activity at the time)   Patient Left in chair;with call bell/phone within reach;with family/visitor present   Nurse Communication          Time: 2633-3545 OT Time Calculation (min): 24 min  Charges: OT General Charges $OT Visit: 1 Visit OT Treatments $Self Care/Home Management : 23-37 mins  Joeseph Amor OTR/L  Acute Rehab Services  [phone] office number [phone] pager number   Joeseph Amor 09/05/2021, 11:03 AM

## 2021-09-05 NOTE — TOC Transition Note (Signed)
Transition of Care [facility]) - CM/SW Discharge Note   Patient Details  Name: Paula [name] MRN: [id number] Date of Birth: 1946-07-08  Transition of Care [facility]) CM/SW Contact:  Tom-Johnson, Renea Ee, RN Phone Number: 09/05/2021, 12:30 PM   Clinical Narrative:    Patient is scheduled for discharge today. Home health referral with [facility] on AVS. Bedside commode and rollator to be delivered at bedside. Daughter to transport at discharge. No further TOC needs noted.   Final next level of care: Maria Antonia Barriers to Discharge: Barriers Resolved   Patient Goals and CMS Choice Patient states their goals for this hospitalization and ongoing recovery are:: To return home CMS Medicare.gov Compare Post Acute Care list provided to:: Patient Choice offered to / list presented to : Patient, Adult Children  Discharge Placement                Patient to be transferred to facility by: Daughter      Discharge Plan and Services   Discharge Planning Services: CM Consult Post Acute Care Choice: Home Health          DME Arranged: 3-N-1, Walker rolling with seat DME Agency: [employer] Date DME Agency Contacted: 09/05/21 Time DME Agency Contacted: 1220 Representative spoke with at DME Agency: Freda Munro HH Arranged: PT, OT, Nurse's Aide [place] Agency: [place] Date [place]: 09/03/21 Time [place]: 87 Representative spoke with at [place]: Amy  Social Determinants of Health ([place]) Interventions     Readmission Risk Interventions No flowsheet data found.

## 2021-09-05 NOTE — Discharge Instructions (Addendum)
Dear Ms. Paula,  [name] presented to the ED with shortness of breath and were found to have too much fluid. You were given lasix and we were able to remove a lot of the fluid off. Your kidney function is very low and you see nephrology outpatient, we had a nephrologist come and see you in the hospital. They do not want to start dialysis yet as you are still making urine but would like for you to continue seeing them in the outpatient setting.   We are discharging you with oral Lasix 80 mg BID, and try to limit your fluids to 1.5 L which is 3-16 oz water bottles or 48 oz total.  We will give you short course of robitussin dm but please check with your ENT doctor for better options for your cough. This medicine provides only symptomatic treatment and we want to attempt to fix the underlying cause. You have an appointment with them this afternoon.   Please follow up with them, your primary care doctor and your nephrologist.   It was a pleasure taking care of you!

## 2021-09-05 NOTE — Plan of Care (Signed)
°  Problem: Clinical Measurements: Goal: Respiratory complications will improve 09/05/2021 1106 by Dolores Hoose, RN Outcome: Adequate for Discharge 09/05/2021 0809 by Dolores Hoose, RN Outcome: Progressing   Problem: Nutrition: Goal: Adequate nutrition will be maintained 09/05/2021 1106 by Dolores Hoose, RN Outcome: Adequate for Discharge 09/05/2021 0809 by Dolores Hoose, RN Outcome: Progressing   Problem: Coping: Goal: Level of anxiety will decrease 09/05/2021 1106 by Dolores Hoose, RN Outcome: Adequate for Discharge 09/05/2021 0809 by Dolores Hoose, RN Outcome: Progressing   Problem: Elimination: Goal: Will not experience complications related to bowel motility 09/05/2021 1106 by Dolores Hoose, RN Outcome: Adequate for Discharge 09/05/2021 0809 by Dolores Hoose, RN Outcome: Progressing Goal: Will not experience complications related to urinary retention Outcome: Adequate for Discharge   Problem: Acute Rehab PT Goals(only PT should resolve) Goal: Pt Will Go Supine/Side To Sit Outcome: Adequate for Discharge Goal: Pt Will Go Sit To Supine/Side Outcome: Adequate for Discharge Goal: Patient Will Transfer Sit To/From Stand Outcome: Adequate for Discharge Goal: Pt Will Ambulate Outcome: Adequate for Discharge Goal: Pt Will Go Up/Down Stairs Outcome: Adequate for Discharge   Problem: Acute Rehab OT Goals (only OT should resolve) Goal: OT Additional ADL Goal #1 Outcome: Adequate for Discharge Goal: OT Additional ADL Goal #2 Outcome: Adequate for Discharge Goal: OT Additional ADL Goal #3 Outcome: Adequate for Discharge Goal: OT Additional ADL Goal #4 Outcome: Adequate for Discharge   Problem: Inadequate Intake (NI-2.1) Goal: Food and/or nutrient delivery Description: Individualized approach for food/nutrient provision. Outcome: Adequate for Discharge

## 2021-09-05 NOTE — Progress Notes (Signed)
PT Cancellation Note  Patient Details Name: LILA LUFKIN MRN: [id number] DOB: [birth year]   Cancelled Treatment:    Reason Eval/Treat Not Completed: Patient declined, no reason specified. Pt dressed upon PT arrival, appears readying to leave hospital. When asked if pt wants to ambulate or negotiate stairs the pt declines, reporting "not now there is too much going on". PT will follow up as time allows.   Zenaida Niece 09/05/2021, 12:17 PM

## 2021-09-05 NOTE — Progress Notes (Signed)
DISCHARGE NOTE HOME [place] to be discharged Home per MD order. Discussed prescriptions and follow up appointments with the patient. Prescriptions given to patient; medication list explained in detail. Patient verbalized understanding.  Skin clean, dry and intact without evidence of skin break down, no evidence of skin tears noted. IV catheter discontinued intact. Site without signs and symptoms of complications. Dressing and pressure applied. Pt denies pain at the site currently. No complaints noted.  Patient free of lines, drains, and wounds.   An After Visit Summary (AVS) was printed and given to the patient. Patient escorted via wheelchair, and discharged home via private auto.  Dolores Hoose, RN

## 2021-09-05 NOTE — Plan of Care (Signed)
  Problem: Clinical Measurements: Goal: Respiratory complications will improve Outcome: Progressing   Problem: Nutrition: Goal: Adequate nutrition will be maintained Outcome: Progressing   Problem: Coping: Goal: Level of anxiety will decrease Outcome: Progressing   Problem: Elimination: Goal: Will not experience complications related to bowel motility Outcome: Progressing   

## 2021-09-06 ENCOUNTER — Other Ambulatory Visit: Payer: Self-pay

## 2021-09-06 ENCOUNTER — Telehealth: Payer: Self-pay

## 2021-09-06 NOTE — Telephone Encounter (Signed)
Transition Care Management Follow-up Telephone Call Date of discharge and from where: 09/05/2021 from [facility] How have you been since you were released from the hospital? Spoke to pt stated she is better and that she had a little food today. Any questions or concerns? No   Items Reviewed: Did the pt receive and understand the discharge instructions provided? Yes  Medications obtained and verified? Yes  Other? No  Any new allergies since your discharge? No  Dietary orders reviewed? No Do you have support at home? Yes, daughter    [place] and Equipment/Supplies: Were home health services ordered? yes If so, what is the name of the agency? [facility]  Has the agency set up a time to come to the patient's home? HH has not reached but PT has reached out to schedule.  Were any new equipment or medical supplies ordered?  Yes: rollator What is the name of the medical supply agency? Not sure, they have not been contacted.  Were you able to get the supplies/equipment? Not yet Do you have any questions related to the use of the equipment or supplies? Not yet   Functional Questionnaire: (I = Independent and D = Dependent) ADLs:I    Follow up appointments reviewed:   PCP Hospital f/u appt confirmed? Yes  Scheduled to see Charlott Rakes MD on 02/21/20223. [facility] f/u appt confirmed? Yes   Are transportation arrangements needed? No  If their condition worsens, is the pt aware to call PCP or go to the Emergency Dept.? Yes Was the patient provided with contact information for the PCP's office or ED? Yes Was to pt encouraged to call back. Yes

## 2021-09-06 NOTE — Telephone Encounter (Signed)
Transition Care Management Follow-up Telephone Call Date of discharge and from where: 09/05/2021 from [facility] How have you been since you were released from the hospital? Spoke to pt dtr and she stated that patient is okay. Pt was on the toilet for a while today trying to make a BM. Pt stated (in the back ground) that when she did have movement, it was loose. Pt also stated (in the back ground) that she "needs help".  Waited on the line while dtr found information about nephrology follow up. While on hold call disconnected. Unable to reconnect, line was busy.  Any questions or concerns? No  Items Reviewed: Did the pt receive and understand the discharge instructions provided? Yes  Medications obtained and verified? Yes  Other? No  Any new allergies since your discharge? No  Dietary orders reviewed? No Do you have support at home? Yes   Home Care and Equipment/Supplies: Were home health services ordered? yes If so, what is the name of the agency? [place]  Has the agency set up a time to come to the patient's home? HH has not reached but PT has reached out to schedule.  Were any new equipment or medical supplies ordered?  Yes: rollator What is the name of the medical supply agency? Not sure, they have not been contacted.  Were you able to get the supplies/equipment? Not yet Do you have any questions related to the use of the equipment or supplies? Not yet  Functional Questionnaire: (I = Independent and D = Dependent) ADLs: I  Bathing/Dressing- I  Meal Prep- I  Eating- I  Maintaining continence- I  Transferring/Ambulation- Use of assistive device  Managing Meds- I   Follow up appointments reviewed:  PCP Hospital f/u appt confirmed? Yes  Scheduled to see Charlott Rakes MD on 10/10/2021 @ 09:30am. [facility] f/u appt confirmed? Yes  Recommended to follow up with Dr. [name]. Pt dtr stated that it is scheduled for  Are transportation arrangements needed? No   If their condition worsens, is the pt aware to call PCP or go to the Emergency Dept.? Yes Was the patient provided with contact information for the PCP's office or ED? Yes Was to pt encouraged to call back with questions or concerns? Yes+

## 2021-09-09 ENCOUNTER — Other Ambulatory Visit (HOSPITAL_COMMUNITY): Payer: Self-pay

## 2021-09-09 ENCOUNTER — Telehealth: Payer: Self-pay | Admitting: Family Medicine

## 2021-09-09 NOTE — Telephone Encounter (Signed)
Home Health Verbal Orders - Caller/Agency: [place]  [facility]  Callback Number: [phone]  Requesting OT/PT/Skilled Nursing/Social Work/Speech Therapy: PT  Frequency: 2 week 1 and 1 week 3   She would also like a medical SS eval for communtity services and nurse eval for medication mgmt.

## 2021-09-10 ENCOUNTER — Other Ambulatory Visit: Payer: Self-pay | Admitting: Family Medicine

## 2021-09-10 NOTE — Telephone Encounter (Signed)
Okay to give verbal orders.  Other concerns will be addressed at an office visit.

## 2021-09-19 ENCOUNTER — Other Ambulatory Visit: Payer: Self-pay | Admitting: Family Medicine

## 2021-09-19 ENCOUNTER — Telehealth: Payer: Self-pay

## 2021-09-19 DIAGNOSIS — E1149 Type 2 diabetes mellitus with other diabetic neurological complication: Secondary | ICD-10-CM

## 2021-09-19 MED ORDER — ACCU-CHEK GUIDE VI STRP: ORAL_STRIP | 12 refills | Status: DC

## 2021-09-19 NOTE — Telephone Encounter (Signed)
[place] called and spoke to [facility], Merchant navy officer about the refill(s) Glucoe test strips requested. Advised it was sent on 07/02/21 #100/12 refill(s). She says because she has Medicare, it's best to resend to their location, she doesn't see it on the profile at the other [place] it was sent to.

## 2021-09-19 NOTE — Telephone Encounter (Signed)
Pt states that she needs a letter to send to the [place] to get her granddaughter her to help take care of her.

## 2021-09-19 NOTE — Telephone Encounter (Signed)
Pt is out of her test strips glucose blood (ACCU-CHEK GUIDE) test strip  [place], [place] [address]  Phone: [phone] Fax: [phone]    Pt has missed 5 days, needs this as soon as possible.   Please call patient when Rx has been sent  [phone]

## 2021-09-19 NOTE — Telephone Encounter (Signed)
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

## 2021-09-20 MED ORDER — ACCU-CHEK GUIDE VI STRP: ORAL_STRIP | 12 refills | Status: DC

## 2021-09-20 NOTE — Addendum Note (Signed)
Addended by: Valli Glance F on: 09/20/2021 10:00 AM   Modules accepted: Orders

## 2021-09-20 NOTE — Telephone Encounter (Signed)
Pt was called and informed of letter being ready to print via her my chart.

## 2021-09-20 NOTE — Telephone Encounter (Signed)
Request Diagnosis code- resent with code in chart visit

## 2021-09-20 NOTE — Telephone Encounter (Signed)
Done

## 2021-09-20 NOTE — Telephone Encounter (Signed)
[facility] called and needs the RX for test strips sent again with the diagnoses code for insurance purposes/ please advise

## 2021-09-23 ENCOUNTER — Other Ambulatory Visit: Payer: Self-pay | Admitting: Internal Medicine

## 2021-09-23 ENCOUNTER — Other Ambulatory Visit: Payer: Self-pay | Admitting: Family Medicine

## 2021-09-23 ENCOUNTER — Other Ambulatory Visit (HOSPITAL_COMMUNITY): Payer: Self-pay

## 2021-09-23 ENCOUNTER — Other Ambulatory Visit: Payer: Self-pay

## 2021-09-23 DIAGNOSIS — I651 Occlusion and stenosis of basilar artery: Secondary | ICD-10-CM

## 2021-09-24 ENCOUNTER — Other Ambulatory Visit: Payer: Self-pay | Admitting: Family Medicine

## 2021-09-24 ENCOUNTER — Other Ambulatory Visit: Payer: Self-pay

## 2021-09-24 MED ORDER — CARVEDILOL 12.5 MG PO TABS
12.5000 mg | ORAL_TABLET | Freq: Two times a day (BID) | ORAL | 1 refills | Status: DC
  Filled 2021-09-24 – 2021-09-25 (×3): qty 180, 90d supply, fill #0

## 2021-09-24 NOTE — Telephone Encounter (Signed)
The letter was fine but they need the grand daughters name Freda Jackson) needs to there and Dr. Benard Rink signature needs to be added / please advise

## 2021-09-24 NOTE — Telephone Encounter (Signed)
Requested Prescriptions  Pending Prescriptions Disp Refills   carvedilol (COREG) 12.5 MG tablet 180 tablet 1    Sig: Take 1 tablet (12.5 mg total) by mouth 2 (two) times daily with a meal.     Cardiovascular: Beta Blockers 3 Failed - 09/24/2021  1:38 PM      Failed - Cr in normal range and within 360 days    Creat  Date Value Ref Range Status  06/27/2016 1.52 (H) 0.50 - 0.99 mg/dL Final    Comment:      For patients > or = 75 years of age: The upper reference limit for Creatinine is approximately 13% higher for people identified as African-American.      Creatinine, Ser  Date Value Ref Range Status  09/05/2021 5.91 (H) 0.44 - 1.00 mg/dL Final   Creatinine, Urine  Date Value Ref Range Status  09/01/2021 47.53 mg/dL Final         Passed - AST in normal range and within 360 days    AST  Date Value Ref Range Status  09/01/2021 23 15 - 41 U/L Final         Passed - ALT in normal range and within 360 days    ALT  Date Value Ref Range Status  09/01/2021 26 0 - 44 U/L Final         Passed - Last BP in normal range    BP Readings from Last 1 Encounters:  09/05/21 138/66         Passed - Last Heart Rate in normal range    Pulse Readings from Last 1 Encounters:  09/05/21 63         Passed - Valid encounter within last 6 months    Recent Outpatient Visits          3 months ago Type 2 diabetes mellitus with other neurologic complication, without long-term current use of insulin (HCC)   [place], Charlane Ferretti, MD   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

## 2021-09-24 NOTE — Telephone Encounter (Signed)
Pts daughter called to find out status of refill request and stated pt is completely out / please advise / Carvedilol

## 2021-09-24 NOTE — Telephone Encounter (Signed)
Letter has been fixed and pt will be called and informed of letter being ready for pick up.

## 2021-09-25 ENCOUNTER — Other Ambulatory Visit: Payer: Self-pay

## 2021-09-25 MED ORDER — AMLODIPINE BESYLATE 5 MG PO TABS
5.0000 mg | ORAL_TABLET | Freq: Every day | ORAL | 0 refills | Status: DC
  Filled 2021-09-25 – 2021-09-26 (×3): qty 30, 30d supply, fill #0

## 2021-09-25 NOTE — Telephone Encounter (Signed)
Requested medications are due for refill today.  yes  Requested medications are on the active medications list.  yes  Last refill. 09/06/2021  Future visit scheduled.   yes  Notes to clinic.  Rx signed by Dr. Humphrey Rolls.    Requested Prescriptions  Pending Prescriptions Disp Refills   amLODipine (NORVASC) 5 MG tablet 30 tablet 0    Sig: Take 1 tablet (5 mg total) by mouth daily.     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

## 2021-09-26 ENCOUNTER — Other Ambulatory Visit: Payer: Self-pay | Admitting: Internal Medicine

## 2021-09-26 ENCOUNTER — Other Ambulatory Visit: Payer: Self-pay

## 2021-09-26 DIAGNOSIS — E1149 Type 2 diabetes mellitus with other diabetic neurological complication: Secondary | ICD-10-CM

## 2021-09-26 MED ORDER — ACCU-CHEK GUIDE VI STRP
ORAL_STRIP | 12 refills | Status: DC
  Filled 2021-09-26 – 2021-10-21 (×2): qty 100, 33d supply, fill #0

## 2021-09-30 ENCOUNTER — Other Ambulatory Visit: Payer: Self-pay

## 2021-10-01 ENCOUNTER — Ambulatory Visit: Payer: Medicare Other | Attending: Family Medicine | Admitting: Family Medicine

## 2021-10-01 ENCOUNTER — Other Ambulatory Visit: Payer: Self-pay

## 2021-10-01 ENCOUNTER — Telehealth: Payer: Self-pay

## 2021-10-01 ENCOUNTER — Telehealth: Payer: Self-pay | Admitting: Family Medicine

## 2021-10-01 ENCOUNTER — Encounter: Payer: Self-pay | Admitting: Family Medicine

## 2021-10-01 ENCOUNTER — Other Ambulatory Visit: Payer: Self-pay | Admitting: Pharmacist

## 2021-10-01 VITALS — BP 131/68 | HR 67 | Ht 62.0 in | Wt 145.0 lb

## 2021-10-01 DIAGNOSIS — Z79899 Other long term (current) drug therapy: Secondary | ICD-10-CM | POA: Insufficient documentation

## 2021-10-01 DIAGNOSIS — I129 Hypertensive chronic kidney disease with stage 1 through stage 4 chronic kidney disease, or unspecified chronic kidney disease: Secondary | ICD-10-CM

## 2021-10-01 DIAGNOSIS — I728 Aneurysm of other specified arteries: Secondary | ICD-10-CM | POA: Diagnosis not present

## 2021-10-01 DIAGNOSIS — E1149 Type 2 diabetes mellitus with other diabetic neurological complication: Secondary | ICD-10-CM

## 2021-10-01 DIAGNOSIS — E1122 Type 2 diabetes mellitus with diabetic chronic kidney disease: Secondary | ICD-10-CM | POA: Insufficient documentation

## 2021-10-01 DIAGNOSIS — I132 Hypertensive heart and chronic kidney disease with heart failure and with stage 5 chronic kidney disease, or end stage renal disease: Secondary | ICD-10-CM | POA: Diagnosis present

## 2021-10-01 DIAGNOSIS — N185 Chronic kidney disease, stage 5: Secondary | ICD-10-CM | POA: Diagnosis not present

## 2021-10-01 DIAGNOSIS — E7800 Pure hypercholesterolemia, unspecified: Secondary | ICD-10-CM | POA: Insufficient documentation

## 2021-10-01 DIAGNOSIS — Z7984 Long term (current) use of oral hypoglycemic drugs: Secondary | ICD-10-CM | POA: Insufficient documentation

## 2021-10-01 DIAGNOSIS — G4709 Other insomnia: Secondary | ICD-10-CM | POA: Diagnosis not present

## 2021-10-01 DIAGNOSIS — I651 Occlusion and stenosis of basilar artery: Secondary | ICD-10-CM | POA: Diagnosis not present

## 2021-10-01 DIAGNOSIS — I509 Heart failure, unspecified: Secondary | ICD-10-CM | POA: Insufficient documentation

## 2021-10-01 DIAGNOSIS — Z1211 Encounter for screening for malignant neoplasm of colon: Secondary | ICD-10-CM

## 2021-10-01 DIAGNOSIS — K5909 Other constipation: Secondary | ICD-10-CM

## 2021-10-01 DIAGNOSIS — N184 Chronic kidney disease, stage 4 (severe): Secondary | ICD-10-CM

## 2021-10-01 MED ORDER — ATORVASTATIN CALCIUM 80 MG PO TABS
80.0000 mg | ORAL_TABLET | Freq: Every day | ORAL | 1 refills | Status: DC
  Filled 2021-10-01 – 2021-11-08 (×4): qty 90, 90d supply, fill #0
  Filled 2022-02-03: qty 90, 90d supply, fill #1

## 2021-10-01 MED ORDER — GLIPIZIDE 5 MG PO TABS
2.5000 mg | ORAL_TABLET | Freq: Two times a day (BID) | ORAL | 1 refills | Status: DC
  Filled 2021-10-01: qty 90, 90d supply, fill #0

## 2021-10-01 MED ORDER — ISOSORBIDE MONONITRATE ER 60 MG PO TB24
ORAL_TABLET | Freq: Every day | ORAL | 1 refills | Status: DC
  Filled 2021-10-01: qty 90, fill #0
  Filled 2021-11-05 – 2021-11-08 (×2): qty 90, 90d supply, fill #0
  Filled 2022-02-03 – 2022-02-10 (×2): qty 90, 90d supply, fill #1

## 2021-10-01 MED ORDER — CARVEDILOL 12.5 MG PO TABS
12.5000 mg | ORAL_TABLET | Freq: Two times a day (BID) | ORAL | 1 refills | Status: DC
  Filled 2021-10-01 – 2021-12-31 (×2): qty 180, 90d supply, fill #0
  Filled 2022-02-17 – 2022-02-18 (×2): qty 180, 90d supply, fill #1

## 2021-10-01 MED ORDER — ALBUTEROL SULFATE HFA 108 (90 BASE) MCG/ACT IN AERS
1.0000 | INHALATION_SPRAY | Freq: Four times a day (QID) | RESPIRATORY_TRACT | 2 refills | Status: DC | PRN
  Filled 2021-10-01: qty 18, 25d supply, fill #0

## 2021-10-01 MED ORDER — ALBUTEROL SULFATE HFA 108 (90 BASE) MCG/ACT IN AERS
1.0000 | INHALATION_SPRAY | Freq: Four times a day (QID) | RESPIRATORY_TRACT | 2 refills | Status: DC | PRN
  Filled 2021-10-01 – 2021-10-02 (×2): qty 8.5, 25d supply, fill #0
  Filled 2021-12-17: qty 8.5, 25d supply, fill #1

## 2021-10-01 MED ORDER — AMLODIPINE BESYLATE 5 MG PO TABS
5.0000 mg | ORAL_TABLET | Freq: Every day | ORAL | 1 refills | Status: DC
  Filled 2021-10-01 – 2021-10-02 (×2): qty 90, 90d supply, fill #0

## 2021-10-01 NOTE — Telephone Encounter (Signed)
Medical Necessity Form for high Utilization I have Paula [name] calling from [facility] [address] following up on fax that was sent on 09/20/2021 regarding the patient's accu chek test strips.   Please advise

## 2021-10-01 NOTE — Telephone Encounter (Signed)
Met with the patient and her daughter, Paula [name], when they were in the clinic today. Paula [name] explained that the patient has received an eviction notice and has a court date - 10/11/2021. The patient has lived in her current residence for 11 years and there is now a new Systems analyst.  Paula [name] also noted that the patient owes more than $3200 in rent.  The patient and her daughter were in agreement to placing a referral to [employer] [facility] for assistance.   The referral was then faxed to attention of Cordie Grice, attorney and it was noted as urgent.

## 2021-10-01 NOTE — Progress Notes (Signed)
Blood sugars are elevated at night. Not sleeping at night Constipation.

## 2021-10-01 NOTE — Telephone Encounter (Signed)
New script has been sent over to pharmacy.

## 2021-10-01 NOTE — Patient Instructions (Signed)
Insomnia ?Insomnia is a sleep disorder that makes it difficult to fall asleep or stay asleep. Insomnia can cause fatigue, low energy, difficulty concentrating, mood swings, and poor performance at work or school. ?There are three different ways to classify insomnia: ?Difficulty falling asleep. ?Difficulty staying asleep. ?Waking up too early in the morning. ?Any type of insomnia can be long-term (chronic) or short-term (acute). Both are common. Short-term insomnia usually lasts for three months or less. Chronic insomnia occurs at least three times a week for longer than three months. ?What are the causes? ?Insomnia may be caused by another condition, situation, or substance, such as: ?Anxiety. ?Certain medicines. ?Gastroesophageal reflux disease (GERD) or other gastrointestinal conditions. ?Asthma or other breathing conditions. ?Restless legs syndrome, sleep apnea, or other sleep disorders. ?Chronic pain. ?Menopause. ?Stroke. ?Abuse of alcohol, tobacco, or illegal drugs. ?Mental health conditions, such as depression. ?Caffeine. ?Neurological disorders, such as Alzheimer's disease. ?An overactive thyroid (hyperthyroidism). ?Sometimes, the cause of insomnia may not be known. ?What increases the risk? ?Risk factors for insomnia include: ?Gender. Women are affected more often than men. ?Age. Insomnia is more common as you get older. ?Stress. ?Lack of exercise. ?Irregular work schedule or working night shifts. ?Traveling between different time zones. ?Certain medical and mental health conditions. ?What are the signs or symptoms? ?If you have insomnia, the main symptom is having trouble falling asleep or having trouble staying asleep. This may lead to other symptoms, such as: ?Feeling fatigued or having low energy. ?Feeling nervous about going to sleep. ?Not feeling rested in the morning. ?Having trouble concentrating. ?Feeling irritable, anxious, or depressed. ?How is this diagnosed? ?This condition may be diagnosed  based on: ?Your symptoms and medical history. Your health care provider may ask about: ?Your sleep habits. ?Any medical conditions you have. ?Your mental health. ?A physical exam. ?How is this treated? ?Treatment for insomnia depends on the cause. Treatment may focus on treating an underlying condition that is causing insomnia. Treatment may also include: ?Medicines to help you sleep. ?Counseling or therapy. ?Lifestyle adjustments to help you sleep better. ?Follow these instructions at home: ?Eating and drinking ? ?Limit or avoid alcohol, caffeinated beverages, and cigarettes, especially close to bedtime. These can disrupt your sleep. ?Do not eat a large meal or eat spicy foods right before bedtime. This can lead to digestive discomfort that can make it hard for you to sleep. ?Sleep habits ? ?Keep a sleep diary to help you and your health care provider figure out what could be causing your insomnia. Write down: ?When you sleep. ?When you wake up during the night. ?How well you sleep. ?How rested you feel the next day. ?Any side effects of medicines you are taking. ?What you eat and drink. ?Make your bedroom a dark, comfortable place where it is easy to fall asleep. ?Put up shades or blackout curtains to block light from outside. ?Use a white noise machine to block noise. ?Keep the temperature cool. ?Limit screen use before bedtime. This includes: ?Watching TV. ?Using your smartphone, tablet, or computer. ?Stick to a routine that includes going to bed and waking up at the same times every day and night. This can help you fall asleep faster. Consider making a quiet activity, such as reading, part of your nighttime routine. ?Try to avoid taking naps during the day so that you sleep better at night. ?Get out of bed if you are still awake after 15 minutes of trying to sleep. Keep the lights down, but try reading or doing a   quiet activity. When you feel sleepy, go back to bed. ?General instructions ?Take over-the-counter  and prescription medicines only as told by your health care provider. ?Exercise regularly, as told by your health care provider. Avoid exercise starting several hours before bedtime. ?Use relaxation techniques to manage stress. Ask your health care provider to suggest some techniques that may work well for you. These may include: ?Breathing exercises. ?Routines to release muscle tension. ?Visualizing peaceful scenes. ?Make sure that you drive carefully. Avoid driving if you feel very sleepy. ?Keep all follow-up visits as told by your health care provider. This is important. ?Contact a health care provider if: ?You are tired throughout the day. ?You have trouble in your daily routine due to sleepiness. ?You continue to have sleep problems, or your sleep problems get worse. ?Get help right away if: ?You have serious thoughts about hurting yourself or someone else. ?If you ever feel like you may hurt yourself or others, or have thoughts about taking your own life, get help right away. You can go to your nearest emergency department or call: ?Your local emergency services (911 in the [place] ?A suicide crisis helpline, such as the National Suicide Prevention Lifeline at 1-800-273-8255 or 988 in the [place] This is open 24 hours a day. ?Summary ?Insomnia is a sleep disorder that makes it difficult to fall asleep or stay asleep. ?Insomnia can be long-term (chronic) or short-term (acute). ?Treatment for insomnia depends on the cause. Treatment may focus on treating an underlying condition that is causing insomnia. ?Keep a sleep diary to help you and your health care provider figure out what could be causing your insomnia. ?This information is not intended to replace advice given to you by your health care provider. Make sure you discuss any questions you have with your health care provider. ?Document Revised: 02/20/2021 Document Reviewed: 06/07/2020 ?Elsevier Patient Education ? 2022 Elsevier Inc. ? ?

## 2021-10-01 NOTE — Progress Notes (Signed)
Subjective:  Patient ID: Paula [name], female    DOB: 20-Jun-1946  Age: 76 y.o. MRN: [id number]  CC: Hospitalization Follow-up   HPI Paula [name] is a 76 y.o. 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 Last month she was hospitalized for acute on chronic kidney injury, hyponatremia and dyspnea secondary to acute exacerbation of HFpEF (ECHO 612 to 70% from 02/2021).  She was diuresed during her hospital course and placed on Lasix. Nuclear stress test from 07/2021 revealed no evidence of ischemia. Accompanied by her daughter to today's visit.  Interval History: She has an appointment with Nephrology next month and she is on fluid restriction.  Endorses compliance with her Lasix.  At the moment there has been no decision as to when she will be placed on renal replacement therapy and she is yet to be evaluated for permacath versus AV fistula. Complains of insomnia as she sleeps 3-4 hours/night and has been taking daytime naps. Goes to bed at 3am. She tried Melatonin which she did not take consistently.  Took 10 mg but then daughter says it caused her to be drowsy all day and then the 5 mg she said was inadequate.  Complains of constipations and moves bowel every 2-3 days and has to use Miralax.in order to move her bowel.  She had to switch from fiber diet to white rice and white bread per recommendations of nephrology  Complains random blood sugars are in the 200-300 in the evening but fasting sugars around 103 -104.  She takes glipizide at night because when she previously took it in the morning she would have hypoglycemia around 11 AM.  Her breakfast is the smallest meal while  dinner and lunch are heavier.  She requests assistance with her rent. Requests a prescription for albuterol MDI Past Medical History:  Diagnosis Date   Allergic rhinitis    Arthritis    Asthma    Brain aneurysm    Chronic kidney disease    Cough    Diabetes mellitus    Diastolic CHF, chronic ([place]) 10/11/2011   GERD (gastroesophageal reflux disease)    History of colon polyps 2012   tubular adenoma    Hyperlipidemia    Hypertension    Neuropathy 10/11/2011   PONV (postoperative nausea and vomiting)    one time after lymph node surgery    Past Surgical History:  Procedure Laterality Date   CATARACT EXTRACTION     right eye   IR 3D INDEPENDENT WKST  12/14/2017   IR ANGIO INTRA EXTRACRAN SEL COM CAROTID INNOMINATE BILAT MOD SED  09/15/2017   IR ANGIO INTRA EXTRACRAN SEL COM CAROTID INNOMINATE UNI L MOD SED  12/14/2017   IR ANGIO VERTEBRAL SEL VERTEBRAL BILAT MOD SED  09/15/2017   IR RADIOLOGIST EVAL & MGMT  09/10/2017   IR RADIOLOGIST EVAL & MGMT  10/19/2017   lymphatic mass surgery     NASAL TURBINATE REDUCTION     RADIOLOGY WITH ANESTHESIA N/A 12/14/2017   Procedure: RADIOLOGY WITH ANESTHESIA EMBOLIZATION;  Surgeon: Luanne Bras, MD;  Location: [facility];  Service: Radiology;  Laterality: N/A;   VIDEO BRONCHOSCOPY Bilateral 11/18/2017   Procedure: VIDEO BRONCHOSCOPY WITHOUT FLUORO;  Surgeon: Collene Gobble, MD;  Location: [facility];  Service: Cardiopulmonary;  Laterality: Bilateral;    Family History  Problem Relation Age of Onset   Hypertension Mother    Asthma Sister    Colon cancer Neg Hx    Allergic rhinitis Neg Hx    Eczema Neg Hx     Allergies  Allergen Reactions   Dust Mite Extract Cough   Ace Inhibitors Cough   Lovastatin Other (See Comments)    Generalized body pain    Outpatient Medications Prior to Visit  Medication Sig Dispense Refill   acetaminophen (TYLENOL) 500 MG tablet Take 500 mg by mouth every 6 (six) hours as needed for fever,  moderate pain or mild pain.     aspirin EC 81 MG tablet Take 81 mg by mouth every evening.     calcitRIOL (ROCALTROL) 0.25 MCG capsule Take 0.25 mcg by mouth daily.     Darbepoetin Alfa (ARANESP) 150 MCG/0.3ML SOSY injection Inject 0.3 mLs (150 mcg total) into the skin every Monday at 6 PM. 1.68 mL    fluticasone (FLONASE) 50 MCG/ACT nasal spray Place 2 sprays into both nostrils daily. 16 g 2   furosemide (LASIX) 40 MG tablet Take 2 tablets (80 mg total) by mouth 2 (two) times daily. 120 tablet 0   glucose blood (ACCU-CHEK GUIDE) test strip Use as instructed 3 times daily 100 each 12   guaiFENesin-dextromethorphan (ROBITUSSIN DM) 100-10 MG/5ML syrup Take 10 mLs by mouth every 4 (four) hours as needed for cough. 118 mL 0   loratadine (CLARITIN) 10 MG tablet Take 1 tablet (10 mg total) by mouth daily. 30 tablet 11   Misc. Devices [employer].  Diagnosis - unstable gait 1 each 0   albuterol (VENTOLIN HFA) 108 (90 Base) MCG/ACT inhaler Inhale 1-2 puffs into the lungs every 6 (six) hours as needed. (Patient taking differently: Inhale 1-2 puffs into the lungs every 6 (six) hours as needed for wheezing or shortness of breath.) 8.5 g 2   amLODipine (NORVASC) 5 MG tablet Take 1 tablet (5 mg total) by mouth daily. 30 tablet 0   atorvastatin (LIPITOR) 80 MG tablet Take 1 tablet (80 mg total) by mouth daily. 90 tablet 1   carvedilol (COREG) 12.5 MG tablet Take 1 tablet (12.5 mg total) by mouth 2 (two) times daily with a meal. 180 tablet 1   glipiZIDE (GLUCOTROL) 5 MG tablet Take 0.5 tablets (2.5 mg total) by mouth daily before breakfast. 30 tablet 3   isosorbide mononitrate (IMDUR) 60 MG 24 hr tablet TAKE 1 TABLET (60 MG TOTAL) BY MOUTH DAILY. (Patient taking differently: Take 60 mg by mouth daily.) 90 tablet 0   No facility-administered medications prior to visit.     ROS Review of Systems  Constitutional:  Negative for activity change, appetite change and fatigue.  HENT:  Negative for congestion, sinus  pressure and sore throat.   Eyes:  Negative for visual disturbance.  Respiratory:  Negative for cough, chest tightness, shortness of breath and wheezing.   Cardiovascular:  Negative for chest pain and palpitations.  Gastrointestinal:  Negative for abdominal distention, abdominal pain and constipation.  Endocrine: Negative for polydipsia.  Genitourinary:  Negative for dysuria and frequency.  Musculoskeletal:  Negative for arthralgias and back pain.  Skin:  Negative for rash.  Neurological:  Negative for tremors, light-headedness and numbness.  Hematological:  Does not bruise/bleed easily.  Psychiatric/Behavioral:  Negative for agitation and behavioral problems.    Objective:  BP 131/68    Pulse 67  Ht 5\' 2"  (1.575 m)    Wt 145 lb (65.8 kg)    LMP  (LMP Unknown)    SpO2 98%    BMI 26.52 kg/m   BP/Weight 10/01/2021 09/05/2021 02/04/349  Systolic BP 093 818 -  Diastolic BP 68 66 -  Wt. (Lbs) 145 - 145.06  BMI 26.52 - 26.53      Physical Exam Constitutional:      Appearance: She is well-developed.  Cardiovascular:     Rate and Rhythm: Normal rate.     Heart sounds: Normal heart sounds. No murmur heard. Pulmonary:     Effort: Pulmonary effort is normal.     Breath sounds: Normal breath sounds. No wheezing or rales.  Chest:     Chest wall: No tenderness.  Abdominal:     General: Bowel sounds are normal. There is no distension.     Palpations: Abdomen is soft. There is no mass.     Tenderness: There is no abdominal tenderness.  Musculoskeletal:        General: Normal range of motion.     Right lower leg: No edema.     Left lower leg: No edema.  Neurological:     Mental Status: She is alert and oriented to person, place, and time.  Psychiatric:        Mood and Affect: Mood normal.    CMP Latest Ref Rng & Units 09/05/2021 09/04/2021 09/03/2021  Glucose 70 - 99 mg/dL 113(H) 130(H) 106(H)  BUN 8 - 23 mg/dL 65(H) 65(H) 60(H)  Creatinine 0.44 - 1.00 mg/dL 5.91(H) 5.78(H) 5.70(H)   Sodium 135 - 145 mmol/L 130(L) 129(L) 129(L)  Potassium 3.5 - 5.1 mmol/L 4.0 4.1 3.9  Chloride 98 - 111 mmol/L 97(L) 95(L) 97(L)  CO2 22 - 32 mmol/L 23 23 21(L)  Calcium 8.9 - 10.3 mg/dL 8.0(L) 7.6(L) 7.2(L)  Total Protein 6.5 - 8.1 g/dL - - -  Total Bilirubin 0.3 - 1.2 mg/dL - - -  Alkaline Phos 38 - 126 U/L - - -  AST 15 - 41 U/L - - -  ALT 0 - 44 U/L - - -    Lipid Panel     Component Value Date/Time   CHOL 106 09/01/2021 1041   CHOL 121 06/17/2021 0904   TRIG 61 09/01/2021 1041   HDL 46 09/01/2021 1041   HDL 46 06/17/2021 0904   CHOLHDL 2.3 09/01/2021 1041   VLDL 12 09/01/2021 1041   LDLCALC 48 09/01/2021 1041   LDLCALC 61 06/17/2021 0904    CBC    Component Value Date/Time   WBC 5.0 09/02/2021 0714   RBC 2.88 (L) 09/02/2021 0714   HGB 8.6 (L) 09/02/2021 0714   HGB 12.4 09/02/2018 1211   HCT 23.0 (L) 09/02/2021 0714   HCT 37.3 09/02/2018 1211   PLT 170 09/02/2021 0714   PLT 203 09/02/2018 1211   MCV 79.9 (L) 09/02/2021 0714   MCV 87 09/02/2018 1211   MCH 29.9 09/02/2021 0714   MCHC 37.4 (H) 09/02/2021 0714   RDW 12.8 09/02/2021 0714   RDW 13.1 09/02/2018 1211   LYMPHSABS 0.8 09/01/2021 0641   LYMPHSABS 1.2 09/02/2018 1211   MONOABS 0.5 09/01/2021 0641   EOSABS 0.2 09/01/2021 0641   EOSABS 0.1 09/02/2018 1211   BASOSABS 0.0 09/01/2021 0641   BASOSABS 0.0 09/02/2018 1211    Lab Results  Component Value Date   HGBA1C 7.4 (H) 09/01/2021    Assessment & Plan:  1. Type 2 diabetes  mellitus with other neurologic complication, without long-term current use of insulin (HCC) Controlled with A1c of 7.4 Less strict glycemic control due to CKD Given elevated random sugars in the evening I have advised her to increase glipizide to twice daily dosing but her morning dose of glipizide will be taken around 11 AM to prevent hypoglycemia and her evening dose of glipizide will remain at the same time Counseled on Diabetic diet, my plate method, 630 minutes of moderate  intensity exercise/week Blood sugar logs with fasting goals of 80-120 mg/dl, random of less than 180 and in the event of sugars less than 60 mg/dl or greater than 400 mg/dl encouraged to notify the clinic. Advised on the need for annual eye exams, annual foot exams, Pneumonia vaccine. - Amb ref to Medical Nutrition Therapy-MNT - glipiZIDE (GLUCOTROL) 5 MG tablet; Take 0.5 tablets (2.5 mg total) by mouth 2 (two) times daily before a meal.  Dispense: 90 tablet; Refill: 1 - Basic Metabolic Panel - atorvastatin (LIPITOR) 80 MG tablet; Take 1 tablet (80 mg total) by mouth daily.  Dispense: 90 tablet; Refill: 1  2. Other insomnia Uncontrolled Given her sensitivity to medications we will need to hold off on prescription medications 10 mg of melatonin caused sedation throughout the whole day so I have advised her to take the 5 mg at night consistently and we have discussed sleep hygiene  3. Basilar artery stenosis Asymptomatic - carvedilol (COREG) 12.5 MG tablet; Take 1 tablet (12.5 mg total) by mouth 2 (two) times daily with a meal.  Dispense: 180 tablet; Refill: 1  4. Hypertension in stage 4 chronic kidney disease due to type 2 diabetes mellitus ([place]) Controlled Counseled on blood pressure goal of less than 130/80, low-sodium, DASH diet, medication compliance, 150 minutes of moderate intensity exercise per week. Discussed medication compliance, adverse effects. - Amb ref to Medical Nutrition Therapy-MNT - amLODipine (NORVASC) 5 MG tablet; Take 1 tablet (5 mg total) by mouth daily.  Dispense: 90 tablet; Refill: 1 - isosorbide mononitrate (IMDUR) 60 MG 24 hr tablet; TAKE 1 TABLET (60 MG TOTAL) BY MOUTH DAILY.  Dispense: 90 tablet; Refill: 1  5. Screening for colon cancer - Cologuard  6. Chronic kidney disease, stage 5 ([place]) She is currently on a renal diet Continue to avoid nephrotoxin   Case manager called him to discuss options for housing with her and issue with her rent.  Resources for  legal aid has been provided as patient does have some amount she was on rent. Meds ordered this encounter  Medications   glipiZIDE (GLUCOTROL) 5 MG tablet    Sig: Take 0.5 tablets (2.5 mg total) by mouth 2 (two) times daily before a meal.    Dispense:  90 tablet    Refill:  1    Dose increase   albuterol (VENTOLIN HFA) 108 (90 Base) MCG/ACT inhaler    Sig: Inhale 1-2 puffs into the lungs every 6 (six) hours as needed for wheezing or shortness of breath.    Dispense:  18 g    Refill:  2   atorvastatin (LIPITOR) 80 MG tablet    Sig: Take 1 tablet (80 mg total) by mouth daily.    Dispense:  90 tablet    Refill:  1   carvedilol (COREG) 12.5 MG tablet    Sig: Take 1 tablet (12.5 mg total) by mouth 2 (two) times daily with a meal.    Dispense:  180 tablet    Refill:  1   amLODipine (  NORVASC) 5 MG tablet    Sig: Take 1 tablet (5 mg total) by mouth daily.    Dispense:  90 tablet    Refill:  1   isosorbide mononitrate (IMDUR) 60 MG 24 hr tablet    Sig: TAKE 1 TABLET (60 MG TOTAL) BY MOUTH DAILY.    Dispense:  90 tablet    Refill:  1    Follow-up: Return in about 3 months (around 12/29/2021) for Chronic medical conditions.     43 minutes of total face to face time spent including median intraservice time reviewing previous notes and test results, counseling patient on insomnia, sleep hygiene in addition to management of chronic medical conditions.Time also spent ordering medications, investigations and documenting in the chart.  All questions were answered to the patient's satisfaction   Charlott Rakes, MD, FAAFP. [facility] [place], [place]   10/01/2021, 10:36 AM

## 2021-10-02 ENCOUNTER — Other Ambulatory Visit (HOSPITAL_COMMUNITY): Payer: Self-pay

## 2021-10-02 ENCOUNTER — Other Ambulatory Visit: Payer: Self-pay

## 2021-10-02 ENCOUNTER — Other Ambulatory Visit: Payer: Self-pay | Admitting: Family Medicine

## 2021-10-02 LAB — BASIC METABOLIC PANEL
BUN/Creatinine Ratio: 12 (ref 12–28)
BUN: 84 mg/dL (ref 8–27)
CO2: 23 mmol/L (ref 20–29)
Calcium: 7.5 mg/dL — ABNORMAL LOW (ref 8.7–10.3)
Chloride: 93 mmol/L — ABNORMAL LOW (ref 96–106)
Creatinine, Ser: 6.81 mg/dL — ABNORMAL HIGH (ref 0.57–1.00)
Glucose: 208 mg/dL — ABNORMAL HIGH (ref 70–99)
Potassium: 4.5 mmol/L (ref 3.5–5.2)
Sodium: 136 mmol/L (ref 134–144)
eGFR: 6 mL/min/{1.73_m2} — ABNORMAL LOW (ref >59)

## 2021-10-02 NOTE — Telephone Encounter (Signed)
Requested medication (s) are due for refill today:   Not sure  Requested medication (s) are on the active medication list:   Yes  Future visit scheduled:   Yes with Dr. Margarita Rana in 1 week   Last ordered: 09/05/2021 #120, 0 refills by another provider.  Returned because prescribed by another provider.       Requested Prescriptions  Pending Prescriptions Disp Refills   furosemide (LASIX) 40 MG tablet 120 tablet 0    Sig: Take 2 tablets (80 mg total) by mouth 2 (two) times daily.     Cardiovascular:  Diuretics - Loop Failed - 10/02/2021  9:32 AM      Failed - Ca in normal range and within 180 days    Calcium  Date Value Ref Range Status  10/01/2021 7.5 (L) 8.7 - 10.3 mg/dL Final   Calcium, Ion  Date Value Ref Range Status  02/21/2021 1.08 (L) 1.15 - 1.40 mmol/L Final          Failed - Cr in normal range and within 180 days    Creat  Date Value Ref Range Status  06/27/2016 1.52 (H) 0.50 - 0.99 mg/dL Final    Comment:      For patients > or = 75 years of age: The upper reference limit for Creatinine is approximately 13% higher for people identified as African-American.      Creatinine, Ser  Date Value Ref Range Status  10/01/2021 6.81 (H) 0.57 - 1.00 mg/dL Final   Creatinine, Urine  Date Value Ref Range Status  09/01/2021 47.53 mg/dL Final          Failed - Cl in normal range and within 180 days    Chloride  Date Value Ref Range Status  10/01/2021 93 (L) 96 - 106 mmol/L Final          Passed - K in normal range and within 180 days    Potassium  Date Value Ref Range Status  10/01/2021 4.5 3.5 - 5.2 mmol/L Final          Passed - Na in normal range and within 180 days    Sodium  Date Value Ref Range Status  10/01/2021 136 134 - 144 mmol/L Final          Passed - Mg Level in normal range and within 180 days    Magnesium  Date Value Ref Range Status  09/01/2021 2.0 1.7 - 2.4 mg/dL Final    Comment:    Performed at [facility], [place] [address], [place], [place] [address]          Passed - Last BP in normal range    BP Readings from Last 1 Encounters:  10/01/21 131/68          Passed - Valid encounter within last 6 months    Recent Outpatient Visits           Yesterday Other insomnia   [place], [place], MD   3 months ago Type 2 diabetes mellitus with other neurologic complication, without long-term current use of insulin (HCC)   [place], Enobong, MD   7 months ago Basilar artery stenosis   [place], Enobong, MD   7 months ago Encounter for medication review   [place], Annie Main L, RPH-CPP   9 months ago Controlled type 2 diabetes mellitus with  chronic kidney disease, without long-term current use of insulin, unspecified CKD stage [facility])   [place], MD       Future Appointments             In 1 week Charlott Rakes, MD [place]   In 2 months Charlott Rakes, MD [place]

## 2021-10-04 ENCOUNTER — Other Ambulatory Visit: Payer: Self-pay

## 2021-10-10 ENCOUNTER — Inpatient Hospital Stay: Payer: Self-pay | Admitting: Family Medicine

## 2021-10-14 ENCOUNTER — Other Ambulatory Visit: Payer: Self-pay

## 2021-10-14 ENCOUNTER — Inpatient Hospital Stay (HOSPITAL_COMMUNITY)
Admission: EM | Admit: 2021-10-14 | Discharge: 2021-10-18 | DRG: 674 | Disposition: A | Discharge status: Home Health | Payer: Medicare Other | Attending: Family Medicine | Admitting: Family Medicine

## 2021-10-14 ENCOUNTER — Emergency Department (HOSPITAL_COMMUNITY): Payer: Medicare Other

## 2021-10-14 ENCOUNTER — Encounter (HOSPITAL_COMMUNITY): Payer: Self-pay

## 2021-10-14 DIAGNOSIS — N185 Chronic kidney disease, stage 5: Secondary | ICD-10-CM | POA: Diagnosis present

## 2021-10-14 DIAGNOSIS — R0609 Other forms of dyspnea: Secondary | ICD-10-CM | POA: Diagnosis not present

## 2021-10-14 DIAGNOSIS — I10 Essential (primary) hypertension: Secondary | ICD-10-CM | POA: Diagnosis present

## 2021-10-14 DIAGNOSIS — Z8249 Family history of ischemic heart disease and other diseases of the circulatory system: Secondary | ICD-10-CM | POA: Diagnosis not present

## 2021-10-14 DIAGNOSIS — E119 Type 2 diabetes mellitus without complications: Secondary | ICD-10-CM | POA: Diagnosis not present

## 2021-10-14 DIAGNOSIS — Z79899 Other long term (current) drug therapy: Secondary | ICD-10-CM

## 2021-10-14 DIAGNOSIS — N179 Acute kidney failure, unspecified: Principal | ICD-10-CM | POA: Diagnosis present

## 2021-10-14 DIAGNOSIS — E1140 Type 2 diabetes mellitus with diabetic neuropathy, unspecified: Secondary | ICD-10-CM | POA: Diagnosis present

## 2021-10-14 DIAGNOSIS — D631 Anemia in chronic kidney disease: Secondary | ICD-10-CM | POA: Diagnosis present

## 2021-10-14 DIAGNOSIS — Z20822 Contact with and (suspected) exposure to covid-19: Secondary | ICD-10-CM | POA: Diagnosis present

## 2021-10-14 DIAGNOSIS — N2581 Secondary hyperparathyroidism of renal origin: Secondary | ICD-10-CM | POA: Diagnosis present

## 2021-10-14 DIAGNOSIS — I509 Heart failure, unspecified: Secondary | ICD-10-CM | POA: Diagnosis not present

## 2021-10-14 DIAGNOSIS — N184 Chronic kidney disease, stage 4 (severe): Secondary | ICD-10-CM

## 2021-10-14 DIAGNOSIS — R5381 Other malaise: Secondary | ICD-10-CM | POA: Diagnosis not present

## 2021-10-14 DIAGNOSIS — J45909 Unspecified asthma, uncomplicated: Secondary | ICD-10-CM | POA: Diagnosis present

## 2021-10-14 DIAGNOSIS — E1165 Type 2 diabetes mellitus with hyperglycemia: Secondary | ICD-10-CM | POA: Diagnosis present

## 2021-10-14 DIAGNOSIS — K219 Gastro-esophageal reflux disease without esophagitis: Secondary | ICD-10-CM | POA: Diagnosis present

## 2021-10-14 DIAGNOSIS — Z7984 Long term (current) use of oral hypoglycemic drugs: Secondary | ICD-10-CM | POA: Diagnosis not present

## 2021-10-14 DIAGNOSIS — E871 Hypo-osmolality and hyponatremia: Secondary | ICD-10-CM | POA: Diagnosis present

## 2021-10-14 DIAGNOSIS — I132 Hypertensive heart and chronic kidney disease with heart failure and with stage 5 chronic kidney disease, or end stage renal disease: Secondary | ICD-10-CM | POA: Diagnosis present

## 2021-10-14 DIAGNOSIS — Z888 Allergy status to other drugs, medicaments and biological substances status: Secondary | ICD-10-CM | POA: Diagnosis not present

## 2021-10-14 DIAGNOSIS — I5032 Chronic diastolic (congestive) heart failure: Secondary | ICD-10-CM | POA: Diagnosis not present

## 2021-10-14 DIAGNOSIS — E1122 Type 2 diabetes mellitus with diabetic chronic kidney disease: Secondary | ICD-10-CM | POA: Diagnosis not present

## 2021-10-14 DIAGNOSIS — M1990 Unspecified osteoarthritis, unspecified site: Secondary | ICD-10-CM | POA: Diagnosis present

## 2021-10-14 DIAGNOSIS — Z7982 Long term (current) use of aspirin: Secondary | ICD-10-CM | POA: Diagnosis not present

## 2021-10-14 DIAGNOSIS — E785 Hyperlipidemia, unspecified: Secondary | ICD-10-CM | POA: Diagnosis not present

## 2021-10-14 DIAGNOSIS — E876 Hypokalemia: Secondary | ICD-10-CM | POA: Diagnosis present

## 2021-10-14 DIAGNOSIS — Z825 Family history of asthma and other chronic lower respiratory diseases: Secondary | ICD-10-CM | POA: Diagnosis not present

## 2021-10-14 DIAGNOSIS — R0602 Shortness of breath: Secondary | ICD-10-CM | POA: Diagnosis present

## 2021-10-14 DIAGNOSIS — I130 Hypertensive heart and chronic kidney disease with heart failure and stage 1 through stage 4 chronic kidney disease, or unspecified chronic kidney disease: Secondary | ICD-10-CM | POA: Diagnosis not present

## 2021-10-14 LAB — CBC
HCT: 30 % — ABNORMAL LOW (ref 36.0–46.0)
Hemoglobin: 10.3 g/dL — ABNORMAL LOW (ref 12.0–15.0)
MCH: 28.5 pg (ref 26.0–34.0)
MCHC: 34.3 g/dL (ref 30.0–36.0)
MCV: 82.9 fL (ref 80.0–100.0)
Platelets: 169 10*3/uL (ref 150–400)
RBC: 3.62 MIL/uL — ABNORMAL LOW (ref 3.87–5.11)
RDW: 13.8 % (ref 11.5–15.5)
WBC: 5 10*3/uL (ref 4.0–10.5)
nRBC: 0 % (ref 0.0–0.2)

## 2021-10-14 LAB — URINALYSIS, ROUTINE W REFLEX MICROSCOPIC
Bilirubin Urine: NEGATIVE
Glucose, UA: 50 mg/dL — AB
Ketones, ur: NEGATIVE mg/dL
Leukocytes,Ua: NEGATIVE
Nitrite: NEGATIVE
Protein, ur: 100 mg/dL — AB
Specific Gravity, Urine: 1.009 (ref 1.005–1.030)
pH: 6 (ref 5.0–8.0)

## 2021-10-14 LAB — BASIC METABOLIC PANEL
Anion gap: 15 (ref 5–15)
BUN: 106 mg/dL — ABNORMAL HIGH (ref 8–23)
CO2: 22 mmol/L (ref 22–32)
Calcium: 6.7 mg/dL — ABNORMAL LOW (ref 8.9–10.3)
Chloride: 95 mmol/L — ABNORMAL LOW (ref 98–111)
Creatinine, Ser: 6.99 mg/dL — ABNORMAL HIGH (ref 0.44–1.00)
GFR, Estimated: 6 mL/min — ABNORMAL LOW (ref >60)
Glucose, Bld: 201 mg/dL — ABNORMAL HIGH (ref 70–99)
Potassium: 3.1 mmol/L — ABNORMAL LOW (ref 3.5–5.1)
Sodium: 132 mmol/L — ABNORMAL LOW (ref 135–145)

## 2021-10-14 LAB — CBG MONITORING, ED
Glucose-Capillary: 195 mg/dL — ABNORMAL HIGH (ref 70–99)
Glucose-Capillary: 300 mg/dL — ABNORMAL HIGH (ref 70–99)

## 2021-10-14 LAB — OSMOLALITY, URINE: Osmolality, Ur: 262 mosm/kg — ABNORMAL LOW (ref 300–900)

## 2021-10-14 LAB — BRAIN NATRIURETIC PEPTIDE: B Natriuretic Peptide: 54.4 pg/mL (ref 0.0–100.0)

## 2021-10-14 LAB — SODIUM, URINE, RANDOM: Sodium, Ur: 21 mmol/L

## 2021-10-14 LAB — RESP PANEL BY RT-PCR (FLU A&B, COVID) ARPGX2
Influenza A by PCR: NEGATIVE
Influenza B by PCR: NEGATIVE
SARS Coronavirus 2 by RT PCR: NEGATIVE

## 2021-10-14 LAB — CREATININE, URINE, RANDOM: Creatinine, Urine: 58.95 mg/dL

## 2021-10-14 MED ORDER — ONDANSETRON HCL 4 MG/2ML IJ SOLN: 4.0000 mg | Freq: Four times a day (QID) | INTRAMUSCULAR | Status: DC | PRN

## 2021-10-14 MED ORDER — CARVEDILOL 12.5 MG PO TABS
12.5000 mg | ORAL_TABLET | Freq: Two times a day (BID) | ORAL | Status: DC
  Administered 2021-10-15 – 2021-10-18 (×7): 12.5 mg via ORAL
  Filled 2021-10-14: qty 1
  Filled 2021-10-14: qty 4
  Filled 2021-10-14 (×5): qty 1

## 2021-10-14 MED ORDER — LORATADINE 10 MG PO TABS
10.0000 mg | ORAL_TABLET | Freq: Every day | ORAL | Status: DC
  Administered 2021-10-15 – 2021-10-18 (×4): 10 mg via ORAL
  Filled 2021-10-14 (×4): qty 1

## 2021-10-14 MED ORDER — ATORVASTATIN CALCIUM 40 MG PO TABS: 80.0000 mg | ORAL_TABLET | Freq: Every day | ORAL | Status: DC

## 2021-10-14 MED ORDER — POLYVINYL ALCOHOL 1.4 % OP SOLN
1.0000 [drp] | OPHTHALMIC | Status: DC | PRN
  Filled 2021-10-14: qty 15

## 2021-10-14 MED ORDER — ATORVASTATIN CALCIUM 80 MG PO TABS
80.0000 mg | ORAL_TABLET | Freq: Every day | ORAL | Status: DC
Start: 2021-10-15 — End: 2021-10-18
  Administered 2021-10-15 – 2021-10-18 (×4): 80 mg via ORAL
  Filled 2021-10-14 (×4): qty 1

## 2021-10-14 MED ORDER — ISOSORBIDE MONONITRATE ER 60 MG PO TB24
60.0000 mg | ORAL_TABLET | Freq: Every day | ORAL | Status: DC
Start: 2021-10-15 — End: 2021-10-18
  Administered 2021-10-15 – 2021-10-18 (×4): 60 mg via ORAL
  Filled 2021-10-14: qty 1
  Filled 2021-10-14: qty 2
  Filled 2021-10-14 (×2): qty 1

## 2021-10-14 MED ORDER — POLYETHYLENE GLYCOL 3350 17 G PO PACK: 17.0000 g | PACK | Freq: Every day | ORAL | Status: DC | PRN

## 2021-10-14 MED ORDER — INSULIN ASPART 100 UNIT/ML IJ SOLN
0.0000 [IU] | Freq: Every day | INTRAMUSCULAR | Status: DC
  Administered 2021-10-14: 23:00:00 3 [IU] via SUBCUTANEOUS
  Administered 2021-10-17: 2 [IU] via SUBCUTANEOUS

## 2021-10-14 MED ORDER — ALBUTEROL SULFATE (2.5 MG/3ML) 0.083% IN NEBU: 2.5000 mg | INHALATION_SOLUTION | RESPIRATORY_TRACT | Status: DC | PRN

## 2021-10-14 MED ORDER — POLYETHYL GLYCOL-PROPYL GLYCOL 0.4-0.3 % OP GEL
Freq: Every day | OPHTHALMIC | Status: DC
Start: 2021-10-14 — End: 2021-10-14

## 2021-10-14 MED ORDER — FUROSEMIDE 10 MG/ML IJ SOLN
80.0000 mg | Freq: Three times a day (TID) | INTRAMUSCULAR | Status: DC
  Administered 2021-10-14 – 2021-10-16 (×4): 80 mg via INTRAVENOUS
  Filled 2021-10-14 (×4): qty 8

## 2021-10-14 MED ORDER — ACETAMINOPHEN 650 MG RE SUPP: 650.0000 mg | Freq: Four times a day (QID) | RECTAL | Status: DC | PRN

## 2021-10-14 MED ORDER — INSULIN ASPART 100 UNIT/ML IJ SOLN
0.0000 [IU] | Freq: Three times a day (TID) | INTRAMUSCULAR | Status: DC
  Administered 2021-10-15: 3 [IU] via SUBCUTANEOUS
  Administered 2021-10-16: 2 [IU] via SUBCUTANEOUS
  Administered 2021-10-16 – 2021-10-17 (×2): 1 [IU] via SUBCUTANEOUS
  Administered 2021-10-17: 3 [IU] via SUBCUTANEOUS
  Administered 2021-10-18: 1 [IU] via SUBCUTANEOUS

## 2021-10-14 MED ORDER — GABAPENTIN 100 MG PO CAPS
100.0000 mg | ORAL_CAPSULE | Freq: Three times a day (TID) | ORAL | Status: DC
  Administered 2021-10-14 – 2021-10-18 (×10): 100 mg via ORAL
  Filled 2021-10-14 (×11): qty 1

## 2021-10-14 MED ORDER — HYDRALAZINE HCL 25 MG PO TABS: 25.0000 mg | ORAL_TABLET | Freq: Three times a day (TID) | ORAL | Status: DC | PRN

## 2021-10-14 MED ORDER — POTASSIUM CHLORIDE CRYS ER 20 MEQ PO TBCR
20.0000 meq | EXTENDED_RELEASE_TABLET | Freq: Once | ORAL | Status: AC
  Administered 2021-10-14: 20 meq via ORAL
  Filled 2021-10-14: qty 1

## 2021-10-14 MED ORDER — FUROSEMIDE 10 MG/ML IJ SOLN
80.0000 mg | Freq: Once | INTRAMUSCULAR | Status: AC
  Administered 2021-10-14: 80 mg via INTRAVENOUS
  Filled 2021-10-14: qty 8

## 2021-10-14 MED ORDER — LORATADINE 10 MG PO TABS: 10.0000 mg | ORAL_TABLET | Freq: Every day | ORAL | Status: DC

## 2021-10-14 MED ORDER — ACETAMINOPHEN 325 MG PO TABS
650.0000 mg | ORAL_TABLET | Freq: Four times a day (QID) | ORAL | Status: DC | PRN
  Administered 2021-10-16: 650 mg via ORAL
  Filled 2021-10-14 (×2): qty 2

## 2021-10-14 MED ORDER — ONDANSETRON HCL 4 MG PO TABS: 4.0000 mg | ORAL_TABLET | Freq: Four times a day (QID) | ORAL | Status: DC | PRN

## 2021-10-14 MED ORDER — ISOSORBIDE MONONITRATE ER 30 MG PO TB24: 60.0000 mg | ORAL_TABLET | Freq: Every day | ORAL | Status: DC

## 2021-10-14 MED ORDER — AMLODIPINE BESYLATE 5 MG PO TABS
5.0000 mg | ORAL_TABLET | Freq: Every day | ORAL | Status: DC
  Administered 2021-10-14 – 2021-10-18 (×5): 5 mg via ORAL
  Filled 2021-10-14 (×5): qty 1

## 2021-10-14 NOTE — ED Triage Notes (Signed)
Pt BIB [facility] for eval of SOB, dizziness/lightheadedness, malaise and loss of appetite. Pt reports these sx have been ongoing for mos. Was seen here a few mos ago and treated w/ lasix for fluid overload-reports sx have been ongoing since that time. When asked what triggered her calling 911 today she states, "it's getting worse at night" and affecting her ability to sleep. Hx of CKD stage 5, not currently on dialysis d/t difficulty obtaining appropriate access. Reports compliance w/ medications ? ?

## 2021-10-14 NOTE — Assessment & Plan Note (Addendum)
Hyponatremia resolved. ?

## 2021-10-14 NOTE — Assessment & Plan Note (Addendum)
Patient presented to the ED with progressive shortness of breath and malaise.   ?Creatinine on admission 6.99 which has been steadily rising over the last several months.   ?Chest x-ray with no acute cardiopulmonary disease process, BNP within normal limits.   ?Nephrology has recommended placement of HD catheter to start dialysis for some time.  Recent renal ultrasound 09/01/2021 with increased renal cortical echogenicity consistent with chronic medical renal disease.   ?She underwent right upper extremity AV graft placement by vascular surgery, Dr. Scot Dock on 10/15/2021.  ?Creatinine trending up 6.99>6.93>7.16>7.69>7.99 ?Continue torsemide 80 mg p.o. twice daily. ?Strict I's and O's and daily weights. ?No urgent indication for dialysis, would wait until graft is incorporated. ?Will eventually need dialysis within the next 3 to 4 weeks. ?Patient is cleared from nephrology to be discharged. ? ?

## 2021-10-14 NOTE — Assessment & Plan Note (Addendum)
Replaced and resolved. ?

## 2021-10-14 NOTE — H&P (Signed)
History and Physical    Paula [name]:[id number] DOB: Mar 11, 1946 DOA: 10/14/2021  PCP: Charlott Rakes, MD  Patient coming from: Home via EMS  I have personally briefly reviewed patient's old medical records in [place]  Chief Complaint: Shortness of breath, malaise  HPI:   Paula [name] is a 76 year old female with past medical history significant for HTN, HLD, DM 2, chronic diastolic congestive heart failure, cerebral aneurysm, CKD stage V who presented to [name] ED on 3/6 via EMS with complaint of shortness of breath, dizziness, malaise, loss of appetite.  Patient reports onset for several months, has been progressing significantly over the last several days.  Patient reports symptoms of shortness of breath worse at night and affecting her ability to sleep.  Patient has been followed by nephrology outpatient, not yet on dialysis but admits still making urine.  In the ED, temperature 97.4 F, HR 61, RR 14, BP 154/70, SPO2 98% on room air.  WBC 5.0, hemoglobin 10.0, platelets 169.  Sodium 132, potassium 3.1, chloride 95, CO2 22, glucose 201, BUN 106, creatinine 6.99.  BNP 54.4.  COVID-19 PCR negative.  Influenza A/B PCR negative.  Urinalysis with 100 protein, otherwise unrevealing.  Chest x-ray with no acute cardiopulmonary disease process.  ED physician discussed case with nephrology, Dr. [name] who recommended medicine admission and will likely need to initiate HD.  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    Review of Systems:  Constitutional - + Fatigue, No Weight Loss Vision - No impaired vision, decreased visual acuity Ear/Nose/Mouth/Throat - No decreased hearing, no congestion Respiratory - + shortness of breath, no exertional dyspnea, no chronic cough Cardiovascular - No chest pain, no palpitations, no peripheral edema Gastrointestinal - + nausea, no diarrhea, no  constipation, Genitourinary - No excessive urination, no urinary incontinence Integumentary - No rashes or concerning skin lesions Neurologic - No numbness, no tingling, + dizziness, no headaches, no confusion or memory loss   Past Medical History:  Diagnosis Date   Allergic rhinitis    Arthritis    Asthma    Brain aneurysm    Chronic kidney disease    Cough    Diabetes mellitus    Diastolic CHF, chronic (HCC) 10/11/2011   GERD (gastroesophageal reflux disease)    History of colon polyps 2012   tubular adenoma    Hyperlipidemia    Hypertension    Neuropathy 10/11/2011   PONV (postoperative nausea and vomiting)    one time after lymph node surgery    Past Surgical History:  Procedure Laterality Date   CATARACT EXTRACTION     right eye   IR 3D INDEPENDENT WKST  12/14/2017   IR ANGIO INTRA EXTRACRAN SEL COM CAROTID INNOMINATE BILAT MOD SED  09/15/2017   IR ANGIO INTRA EXTRACRAN SEL COM CAROTID INNOMINATE UNI L MOD SED  12/14/2017   IR ANGIO VERTEBRAL SEL VERTEBRAL BILAT MOD SED  09/15/2017   IR RADIOLOGIST EVAL & MGMT  09/10/2017   IR RADIOLOGIST EVAL & MGMT  10/19/2017   lymphatic mass surgery     NASAL TURBINATE REDUCTION     RADIOLOGY WITH ANESTHESIA N/A 12/14/2017   Procedure: RADIOLOGY WITH ANESTHESIA EMBOLIZATION;  Surgeon: Luanne Bras, MD;  Location: [facility];  Service: Radiology;  Laterality: N/A;   VIDEO BRONCHOSCOPY Bilateral 11/18/2017   Procedure: VIDEO BRONCHOSCOPY WITHOUT FLUORO;  Surgeon: Collene Gobble, MD;  Location: [facility];  Service: Cardiopulmonary;  Laterality: Bilateral;     reports that she has never smoked. She has never used smokeless tobacco. She reports that she does not drink alcohol and does not use drugs.  Allergies  Allergen Reactions   Dust Mite Extract Cough   Ace Inhibitors Cough   Lovastatin Other (See Comments)    Generalized body pain    Family History  Problem Relation Age of Onset   Hypertension Mother    Asthma  Sister    Colon cancer Neg Hx    Allergic rhinitis Neg Hx    Eczema Neg Hx     Family history reviewed and not pertinent   Prior to Admission medications   Medication Sig Start Date End Date Taking? Authorizing Provider  acetaminophen (TYLENOL) 500 MG tablet Take 500 mg by mouth every 6 (six) hours as needed for fever, moderate pain or mild pain.   Yes [provider]  albuterol (PROAIR HFA) 108 (90 Base) MCG/ACT inhaler Inhale 1-2 puffs into the lungs every 6 (six) hours as needed for wheezing or shortness of breath. 10/01/21  Yes Newlin, Enobong, MD  amLODipine (NORVASC) 5 MG tablet Take 1 tablet (5 mg total) by mouth daily. 10/01/21 12/31/21 Yes Charlott Rakes, MD  aspirin EC 81 MG tablet Take 81 mg by mouth every evening.   Yes [provider]  atorvastatin (LIPITOR) 80 MG tablet Take 1 tablet (80 mg total) by mouth daily. 10/01/21  Yes Charlott Rakes, MD  carvedilol (COREG) 12.5 MG tablet Take 1 tablet (12.5 mg total) by mouth 2 (two) times daily with a meal. 10/01/21  Yes Newlin, Enobong, MD  dextromethorphan (DELSYM) 30 MG/5ML liquid Take 30 mg by mouth daily as needed for cough.   Yes [provider]  fluticasone (FLONASE) 50 MCG/ACT nasal spray Place 2 sprays into both nostrils daily. 03/15/21  Yes Collene Gobble, MD  furosemide (LASIX) 40 MG tablet Take 2 tablets (80 mg total) by mouth 2 (two) times daily. Patient taking differently: Take 40 mg by mouth 2 (two) times daily. 09/05/21 10/14/21 Yes [name], MD  gabapentin (NEURONTIN) 100 MG capsule Take 100 mg by mouth 3 (three) times daily. 09/05/21  Yes [provider]  glipiZIDE (GLUCOTROL) 5 MG tablet Take 0.5 tablets (2.5 mg total) by mouth 2 (two) times daily before a meal. 10/01/21  Yes Charlott Rakes, MD  Homeopathic Products (EARACHE DROPS OT) Place 1 drop into both ears daily.   Yes [provider]  isosorbide mononitrate (IMDUR) 60 MG 24 hr tablet TAKE 1 TABLET (60 MG TOTAL) BY MOUTH  DAILY. 10/01/21 10/01/22 Yes Charlott Rakes, MD  loratadine (CLARITIN) 10 MG tablet Take 1 tablet (10 mg total) by mouth daily. 03/15/21  Yes Collene Gobble, MD  Polyethyl Glycol-Propyl Glycol (SYSTANE OP) Place 1 drop into both eyes daily.   Yes [provider]  calcitRIOL (ROCALTROL) 0.25 MCG capsule Take 0.25 mcg by mouth daily. Patient not taking: Reported on 10/14/2021    [provider]  Darbepoetin Alfa (ARANESP) 150 MCG/0.3ML SOSY injection Inject 0.3 mLs (150 mcg total) into the skin every Monday at 6 PM. Patient not taking: Reported on 10/14/2021 09/09/21   [name], MD  glucose blood (ACCU-CHEK GUIDE) test strip Use as instructed 3 times daily 09/26/21   Charlott Rakes, MD  guaiFENesin-dextromethorphan (ROBITUSSIN DM) 100-10 MG/5ML syrup Take 10 mLs by mouth every 4 (four) hours as needed for cough. Patient not taking: Reported on 10/14/2021 09/05/21   [name], MD  Misc. Devices [place].  Diagnosis - unstable gait 03/05/21   Charlott Rakes, MD  sodium chloride (OCEAN) 0.65 % SOLN nasal spray Place 2 sprays into both nostrils as needed for congestion. Patient not taking: No sig reported 03/21/19 02/21/21  Martyn Ehrich, NP    Physical Exam: Vitals:   10/14/21 1248 10/14/21 1419 10/14/21 1523 10/14/21 1645  BP:  134/69 (!) 154/70 (!) 170/72  Pulse:  64 61 65  Resp:  18 14 17   Temp:  (!) 97.4 F (36.3 C)    TempSrc:  Oral    SpO2:  100% 98% 99%  Weight: 66 kg     Height: 5\' 2"  (1.575 m)       Physical Exam: GEN: NAD, alert and oriented x 3, wd/wn HEENT: NCAT, PERRL, EOMI, sclera clear, MMM PULM: CTAB w/o wheezes/crackles, normal respiratory effort, on room air CV: RRR w/o M/G/R GI: abd soft, NTND, NABS, no R/G/M MSK: no peripheral edema, muscle strength globally intact 5/5 bilateral upper/lower extremities NEURO: CN II-XII intact, no focal deficits, sensation to light touch intact PSYCH: normal mood/affect Integumentary: dry/intact, no rashes or  wounds   Labs on Admission: I have personally reviewed following labs and imaging studies  CBC: Recent Labs  Lab 10/14/21 1256  WBC 5.0  HGB 10.3*  HCT 30.0*  MCV 82.9  PLT 937   Basic Metabolic Panel: Recent Labs  Lab 10/14/21 1256  NA 132*  K 3.1*  CL 95*  CO2 22  GLUCOSE 201*  BUN 106*  CREATININE 6.99*  CALCIUM 6.7*   GFR: Estimated Creatinine Clearance: 6.2 mL/min (A) (by C-G formula based on SCr of 6.99 mg/dL (H)). Liver Function Tests: No results for input(s): AST, ALT, ALKPHOS, BILITOT, PROT, ALBUMIN in the last 168 hours. No results for input(s): LIPASE, AMYLASE in the last 168 hours. No results for input(s): AMMONIA in the last 168 hours. Coagulation Profile: No results for input(s): INR, PROTIME in the last 168 hours. Cardiac Enzymes: No results for input(s): CKTOTAL, CKMB, CKMBINDEX, TROPONINI in the last 168 hours. BNP (last 3 results) No results for input(s): PROBNP in the last 8760 hours. HbA1C: No results for input(s): HGBA1C in the last 72 hours. CBG: Recent Labs  Lab 10/14/21 1259  GLUCAP 195*   Lipid Profile: No results for input(s): CHOL, HDL, LDLCALC, TRIG, CHOLHDL, LDLDIRECT in the last 72 hours. Thyroid Function Tests: No results for input(s): TSH, T4TOTAL, FREET4, T3FREE, THYROIDAB in the last 72 hours. Anemia Panel: No results for input(s): VITAMINB12, FOLATE, FERRITIN, TIBC, IRON, RETICCTPCT in the last 72 hours. Urine analysis:    Component Value Date/Time   COLORURINE STRAW (A) 10/14/2021 1249   APPEARANCEUR CLEAR 10/14/2021 1249   LABSPEC 1.009 10/14/2021 1249   PHURINE 6.0 10/14/2021 1249   GLUCOSEU 50 (A) 10/14/2021 1249   HGBUR SMALL (A) 10/14/2021 1249   BILIRUBINUR NEGATIVE 10/14/2021 1249   BILIRUBINUR negative 09/02/2018 1128   KETONESUR NEGATIVE 10/14/2021 1249   PROTEINUR 100 (A) 10/14/2021 1249   UROBILINOGEN 0.2 09/02/2018 1128   UROBILINOGEN 0.2 05/12/2018 1259   NITRITE NEGATIVE 10/14/2021 1249    LEUKOCYTESUR NEGATIVE 10/14/2021 1249    Radiological Exams on Admission: DG Chest Port 1 View  Result Date: 10/14/2021 CLINICAL DATA:  Shortness of breath. EXAM: PORTABLE CHEST 1 VIEW COMPARISON:  Chest x-ray 01/30/2022. FINDINGS: The heart size and mediastinal contours are within normal limits. Both lungs are clear. The visualized skeletal structures are unremarkable. IMPRESSION: No active disease. Electronically Signed  By: Ronney Asters M.D.   On: 10/14/2021 15:56    EKG: Independently reviewed.   Assessment and Plan: * Acute renal failure superimposed on stage 5 chronic kidney disease, not on chronic dialysis [facility]) Patient presenting to the ED with progressive shortness of breath malaise.  Creatinine on admission 6.99 which has been steadily rising over the last several months.  BUN elevated 106, potassium 3.1.  Chest x-ray with no acute cardiopulmonary disease process, BNP within normal limits.  Nephrology has been recommending placement of HD catheter to start dialysis for some time.  Recent renal ultrasound 09/01/2021 with increased renal cortical echogenicity consistent with chronic medical renal disease. -- Admit to Med surg unit --Furosemide 80 mg IV q8h --Bladder scan x1 to ensure no urinary retention --will not repeat renal ultrasound given recent ultrasound on 09/01/2021 as above --Strict I's and O's and daily weights --Renal function daily --N.p.o. after midnight for possible HD catheter placement tomorrow; holding home aspirin, INR in the a.m.  Hypokalemia Potassium 3.1 on admission. --Kdur 20 x 1 --Repeat electrolytes in the a.m. to include magnesium  Hyponatremia Sodium 132 on admission, in the setting of hyperglycemia with glucose 261.  Sodium corrected for hyperglycemia = 135.  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 --Check serum osmolality, urine osmolality, urine sodium --Repeat renal function panel in a.m.  Type 2  diabetes mellitus without complication, without long-term current use of insulin ([place]) Patient on glipizide 2.5 mg p.o. twice daily at baseline.  Most recent hemoglobin A1c 7.4 on 09/01/2021. --Update hemoglobin A1c --Hold oral hypoglycemics while inpatient --SSI for coverage --CBGs qAC/HS  Essential hypertension --Continue home amlodipine 5 mg p.o. daily, carvedilol 12.5 mg p.o. twice daily, Imdur 60 mg p.o. daily. -- Hydralazine 25 mg p.o. q8h PRN SBP >180 or DBP >110  Hyperlipidemia --Atorvastatin 80 mg p.o. daily  Chronic diastolic CHF (congestive heart failure) ([place]) TTE 02/12/2021 with LVEF 60 to 99%, grade 1 diastolic dysfunction, severe LVH, left atrial enlargement, no aortic stenosis.  BMP within normal limits on admission.  Chest x-ray with no pulmonary edema. --Continue furosemide as above --Strict I's and O's and daily weight     DVT prophylaxis: SCDs, holding chemical DVT prophylaxis for anticipated HD catheter placement Code Status: Full code Family Communication: No family at bedside, patient states daughter has "issues" and declines calling family members Disposition Plan: Anticipate discharge home once HD outpatient set up Consults called: ED physician discussed with nephrology, Dr. [name] who will see patient in consultation tomorrow Admission status: Inpatient Level of care: Med-Surg   Severity of Illness: The appropriate patient status for this patient is INPATIENT. Inpatient status is judged to be reasonable and necessary in order to provide the required intensity of service to ensure the patient's safety. The patient's presenting symptoms, physical exam findings, and initial radiographic and laboratory data in the context of their chronic comorbidities is felt to place them at high risk for further clinical deterioration. Furthermore, it is not anticipated that the patient will be medically stable for discharge from the hospital within 2 midnights of admission. The  following factors support the patient status of inpatient.   " The patient's presenting symptoms include shortness of breath, weakness, fatigue, malaise " The worrisome physical exam findings include dyspnea " The initial radiographic and laboratory data are worrisome because of elevated creatinine, elevated BUN, hyponatremia, hypokalemia " The chronic co-morbidities include advanced age, HTN, CKD stage V, HLD, DM 2, diastolic congestive heart  failure.   * I certify that at the point of admission it is my clinical judgment that the patient will require inpatient hospital care spanning beyond 2 midnights from the point of admission due to high intensity of service, high risk for further deterioration and high frequency of surveillance required.*    Kariem Wolfson J [place] DO [facility] Available via Epic secure chat 7am-7pm After these hours, please refer to coverage provider listed on [email or web address] 10/14/2021, 4:53 PM

## 2021-10-14 NOTE — ED Provider Triage Note (Signed)
Emergency Medicine Provider Triage Evaluation Note ? ?Paula [name] , a 76 y.o. female  was evaluated in triage.  Pt complains of shortness of breath.  Patient states she has had stage V CKD and is not on dialysis.  She said that she has had worsening shortness of breath, dizziness, and lightheadedness over the past day.  She says she noticed symptoms last night got acutely worse while she was sleeping.  This is why she presented here today.  She says she has had a lot of the symptoms for multiple months though that a progressively gotten worse. ? ?Review of Systems  ?Positive: Shortness of breath, dizziness, lightheaded ?Negative:  ? ?Physical Exam  ?BP 139/62 (BP Location: Right Arm)   Pulse 65   Temp 98.5 ?F (36.9 ?C) (Oral)   Resp 20   Ht 5\' 2"  (1.575 m)   Wt 66 kg   LMP  (LMP Unknown)   SpO2 98%   BMI 26.61 kg/m?  ?Gen:   Awake, no distress   ?Resp:  Normal effort  ?MSK:   Moves extremities without difficulty  ?Other:  Lungs clear bilaterally. No peripheral edema noted ? ?Medical Decision Making  ?Medically screening exam initiated at 12:52 PM.  Appropriate orders placed.  Paula [name] was informed that the remainder of the evaluation will be completed by another provider, this initial triage assessment does not replace that evaluation, and the importance of remaining in the ED until their evaluation is complete. ? ?Patient is poor historian. This could be progression of kidney issues and fluid on lungs. Shortness of breath labs ordered with CXR.  ?  ?Adolphus Birchwood, PA-C ?10/14/21 1254 ? ?

## 2021-10-14 NOTE — ED Notes (Addendum)
RN went into pt room to check CBG. Pt CBG is 300. RN explained to pt she will be back with her insulin. Pt refused insulin and states "no I do not want it, it will go down by morning." RN explained to pt that her CBG is high and she should take her insulin. Pt then called out and asked if she could take her home medication that she brought with her, glipizide. RN explained to pt that she should not be taking her home medications, that the RN will work on getting her medication ordered for her. RN explained the importance of taking medication that is ordered for her at the hospital and the policy for home medication.  ?

## 2021-10-14 NOTE — ED Provider Notes (Signed)
?[place] ?Provider Note ? ? ?CSN: [id number] ?Arrival date & time: 10/14/21  1237 ? ?  ? ?History ? ?Chief Complaint  ?Patient presents with  ? Shortness of Breath  ? ? ?Paula [name] is a 76 y.o. female history of CKD, not on dialysis here presenting with shortness of breath.  Patient has baseline CKD and was supposed to get catheter for dialysis.  Patient states that she has not been able to get a catheter yet.  Patient states that since yesterday she got worsening shortness of breath.  She states that for the last month or so, she has been very tired and has poor appetite.  Patient states that she still urinates and takes 80 mg of Lasix twice daily ? ?The history is provided by the patient.  ? ?  ? ?Home Medications ?Prior to Admission medications   ?Medication Sig Start Date End Date Taking? Authorizing Provider  ?acetaminophen (TYLENOL) 500 MG tablet Take 500 mg by mouth every 6 (six) hours as needed for fever, moderate pain or mild pain.    [provider]  ?albuterol (PROAIR HFA) 108 (90 Base) MCG/ACT inhaler Inhale 1-2 puffs into the lungs every 6 (six) hours as needed for wheezing or shortness of breath. 10/01/21   Charlott Rakes, MD  ?amLODipine (NORVASC) 5 MG tablet Take 1 tablet (5 mg total) by mouth daily. 10/01/21 12/31/21  Charlott Rakes, MD  ?aspirin EC 81 MG tablet Take 81 mg by mouth every evening.    [provider]  ?atorvastatin (LIPITOR) 80 MG tablet Take 1 tablet (80 mg total) by mouth daily. 10/01/21   Charlott Rakes, MD  ?calcitRIOL (ROCALTROL) 0.25 MCG capsule Take 0.25 mcg by mouth daily.    [provider]  ?carvedilol (COREG) 12.5 MG tablet Take 1 tablet (12.5 mg total) by mouth 2 (two) times daily with a meal. 10/01/21   Charlott Rakes, MD  ?Darbepoetin Alfa (ARANESP) 150 MCG/0.3ML SOSY injection Inject 0.3 mLs (150 mcg total) into the skin every Monday at 6 PM. 09/09/21   [name], MD  ?fluticasone [facility]) 50  MCG/ACT nasal spray Place 2 sprays into both nostrils daily. 03/15/21   Collene Gobble, MD  ?furosemide (LASIX) 40 MG tablet Take 2 tablets (80 mg total) by mouth 2 (two) times daily. 09/05/21 10/05/21  [name], MD  ?glipiZIDE (GLUCOTROL) 5 MG tablet Take 0.5 tablets (2.5 mg total) by mouth 2 (two) times daily before a meal. 10/01/21   Charlott Rakes, MD  ?glucose blood (ACCU-CHEK GUIDE) test strip Use as instructed 3 times daily 09/26/21   Charlott Rakes, MD  ?guaiFENesin-dextromethorphan (ROBITUSSIN DM) 100-10 MG/5ML syrup Take 10 mLs by mouth every 4 (four) hours as needed for cough. 09/05/21   [name], MD  ?isosorbide mononitrate (IMDUR) 60 MG 24 hr tablet TAKE 1 TABLET (60 MG TOTAL) BY MOUTH DAILY. 10/01/21 10/01/22  Charlott Rakes, MD  ?loratadine (CLARITIN) 10 MG tablet Take 1 tablet (10 mg total) by mouth daily. 03/15/21   Collene Gobble, MD  ?Misc. Devices [employer].  Diagnosis - unstable gait 03/05/21   Charlott Rakes, MD  ?sodium chloride (OCEAN) 0.65 % SOLN nasal spray Place 2 sprays into both nostrils as needed for congestion. ?Patient not taking: No sig reported 03/21/19 02/21/21  Martyn Ehrich, NP  ?   ? ?Allergies    ?Dust mite extract, Ace inhibitors, and Lovastatin   ? ?Review of Systems   ?Review of Systems  ?Respiratory:  Positive for shortness of breath.   ? ?Physical Exam ?Updated Vital Signs ?BP (!) 154/70   Pulse 61   Temp (!) 97.4 ?F (36.3 ?C) (Oral)   Resp 14   Ht 5\' 2"  (1.575 m)   Wt 66 kg   LMP  (LMP Unknown)   SpO2 98%   BMI 26.61 kg/m?  ?Physical Exam ?Vitals reviewed.  ?HENT:  ?   Head: Normocephalic.  ?   Mouth/Throat:  ?   Pharynx: Oropharynx is clear.  ?Eyes:  ?   Extraocular Movements: Extraocular movements intact.  ?   Pupils: Pupils are equal, round, and reactive to light.  ?Cardiovascular:  ?   Rate and Rhythm: Normal rate and regular rhythm.  ?Pulmonary:  ?   Comments: Crackles bilateral bases ?Musculoskeletal:     ?   General: Normal range of motion.  ?    Cervical back: Normal range of motion and neck supple.  ?Skin: ?   General: Skin is warm.  ?   Capillary Refill: Capillary refill takes less than 2 seconds.  ?Neurological:  ?   General: No focal deficit present.  ?   Mental Status: She is alert and oriented to person, place, and time.  ?Psychiatric:     ?   Mood and Affect: Mood normal.     ?   Behavior: Behavior normal.  ? ? ?ED Results / Procedures / Treatments   ?Labs ?(all labs ordered are listed, but only abnormal results are displayed) ?Labs Reviewed  ?BASIC METABOLIC PANEL - Abnormal; Notable for the following components:  ?    Result Value  ? Sodium 132 (*)   ? Potassium 3.1 (*)   ? Chloride 95 (*)   ? Glucose, Bld 201 (*)   ? BUN 106 (*)   ? Creatinine, Ser 6.99 (*)   ? Calcium 6.7 (*)   ? GFR, Estimated 6 (*)   ? All other components within normal limits  ?CBC - Abnormal; Notable for the following components:  ? RBC 3.62 (*)   ? Hemoglobin 10.3 (*)   ? HCT 30.0 (*)   ? All other components within normal limits  ?URINALYSIS, ROUTINE W REFLEX MICROSCOPIC - Abnormal; Notable for the following components:  ? Color, Urine STRAW (*)   ? Glucose, UA 50 (*)   ? Hgb urine dipstick SMALL (*)   ? Protein, ur 100 (*)   ? Bacteria, UA RARE (*)   ? All other components within normal limits  ?CBG MONITORING, ED - Abnormal; Notable for the following components:  ? Glucose-Capillary 195 (*)   ? All other components within normal limits  ?RESP PANEL BY RT-PCR (FLU A&B, COVID) ARPGX2  ?BRAIN NATRIURETIC PEPTIDE  ? ? ?EKG ?EKG Interpretation ? ?Date/Time:  Monday October 14 2021 12:43:18 EST ?Ventricular Rate:  66 ?PR Interval:  250 ?QRS Duration: 76 ?QT Interval:  444 ?QTC Calculation: 465 ?R Axis:   -16 ?Text Interpretation: Sinus rhythm with 1st degree A-V block Low voltage QRS Cannot rule out Anterior infarct , age undetermined Abnormal ECG When compared with ECG of 01-Sep-2021 06:37, PREVIOUS ECG IS PRESENT Confirmed by Wandra Arthurs [phone]) on 10/14/2021 3:44:51  PM ? ?Radiology ?No results found. ? ?Procedures ?Procedures  ? ? ?Medications Ordered in ED ?Medications  ?furosemide (LASIX) injection 80 mg (has no administration in time range)  ? ? ?ED Course/ Medical Decision Making/ A&P ?  ?                        ?  Medical Decision Making ?Paula [name] is a 76 y.o. female here presenting with worsening shortness of breath.  Patient states this been going on for the last month or so.  Patient has progressively worsening symptoms and also dizziness.  I talked to Dr. [name] from nephrology.  Apparently patient has not been very compliant and has been resistant to start dialysis so therefore a catheter has not been placed.  We will check kidney function and get a chest x-ray ? ?3:50 PM ?Patient has worsening renal failure and creatinine is now 7.  BUN is 100.  Dr. [name] suggested IV Lasix 80 mg 3 times daily.  He will see patient tomorrow and he recommend that patient be admitted for diuresis and to coordinate getting her catheter placed. ?  ? ?Problems Addressed: ?Acute renal failure superimposed on stage 4 chronic kidney disease, unspecified acute renal failure type [facility]): acute illness or injury ?Shortness of breath: acute illness or injury ? ?Amount and/or Complexity of Data Reviewed ?Labs: ordered. Decision-making details documented in ED Course. ?Radiology: ordered and independent interpretation performed. Decision-making details documented in ED Course. ?ECG/medicine tests: ordered and independent interpretation performed. Decision-making details documented in ED Course. ? ?Risk ?Prescription drug management. ?Decision regarding hospitalization. ? ? ? ?Final Clinical Impression(s) / ED Diagnoses ?Final diagnoses:  ?None  ? ? ?Rx / DC Orders ?ED Discharge Orders   ? ? None  ? ?  ? ? ?  ?Drenda Freeze, MD ?10/14/21 1552 ? ?

## 2021-10-14 NOTE — Assessment & Plan Note (Addendum)
Continue amlodipine, carvedilol, Imdur. ?Continue hydralazine as needed. ?Lasix changed with torsemide. ?

## 2021-10-14 NOTE — Assessment & Plan Note (Addendum)
Hemoglobin A1c 7.6. ?Hold oral hypoglycemics. ? ?

## 2021-10-14 NOTE — Assessment & Plan Note (Signed)
Atorvastatin 80 mg p.o. daily ?

## 2021-10-14 NOTE — Hospital Course (Addendum)
Paula [name] is a 76 year old female with PMH significant for HTN, HLD, DM 2, chronic diastolic congestive heart failure, cerebral aneurysm, CKD stage V who presented to [facility] ED on 3/6 via EMS with complaints of shortness of breath, dizziness, malaise, loss of appetite.  Patient reports onset for several months, has been progressing significantly over the last several days. Patient has been followed by nephrology outpatient, not yet started on dialysis but admits still making urine.  Serum creatinine on arrival 6.99. ED physician discussed case with nephrology, Dr. [name] who recommended medicine admission and will likely need to initiate HD.  Patient was admitted for AKI on CKD stage IV nephrology and vascular surgery consulted.  Patient underwent AV fistula, renal functions remains steady.  Nephrologist stated no urgent indication for hemodialysis will wait for AV graft maturation to initiate hemodialysis.  Patient feels better want to be discharged.  Patient is being discharged and she will follow-up with nephrology in 1 week.  Home health services been arranged. ?

## 2021-10-14 NOTE — Assessment & Plan Note (Addendum)
TTE 02/12/2021 with LVEF 60 to 95%, grade 1 diastolic dysfunction, severe LVH, left atrial enlargement, no aortic stenosis.  BMP within normal limits on admission.  Chest x-ray with no pulmonary edema. ?Continue torsemide as above ?TTE unremarkable. ?

## 2021-10-15 ENCOUNTER — Encounter (HOSPITAL_COMMUNITY)
Admission: EM | Disposition: A | Discharge status: Home Health | Payer: Self-pay | Source: Home / Self Care | Attending: Internal Medicine

## 2021-10-15 ENCOUNTER — Other Ambulatory Visit: Payer: Self-pay

## 2021-10-15 ENCOUNTER — Inpatient Hospital Stay (HOSPITAL_COMMUNITY): Payer: Medicare Other | Admitting: Anesthesiology

## 2021-10-15 ENCOUNTER — Encounter (HOSPITAL_COMMUNITY): Payer: Self-pay | Admitting: Internal Medicine

## 2021-10-15 DIAGNOSIS — I509 Heart failure, unspecified: Secondary | ICD-10-CM

## 2021-10-15 DIAGNOSIS — I10 Essential (primary) hypertension: Secondary | ICD-10-CM | POA: Diagnosis not present

## 2021-10-15 DIAGNOSIS — E1122 Type 2 diabetes mellitus with diabetic chronic kidney disease: Secondary | ICD-10-CM

## 2021-10-15 DIAGNOSIS — N185 Chronic kidney disease, stage 5: Secondary | ICD-10-CM

## 2021-10-15 DIAGNOSIS — I132 Hypertensive heart and chronic kidney disease with heart failure and with stage 5 chronic kidney disease, or end stage renal disease: Secondary | ICD-10-CM

## 2021-10-15 DIAGNOSIS — E785 Hyperlipidemia, unspecified: Secondary | ICD-10-CM | POA: Diagnosis not present

## 2021-10-15 DIAGNOSIS — R5381 Other malaise: Secondary | ICD-10-CM | POA: Diagnosis present

## 2021-10-15 DIAGNOSIS — I5032 Chronic diastolic (congestive) heart failure: Secondary | ICD-10-CM

## 2021-10-15 DIAGNOSIS — N179 Acute kidney failure, unspecified: Secondary | ICD-10-CM | POA: Diagnosis not present

## 2021-10-15 HISTORY — PX: AV FISTULA PLACEMENT: SHX1204

## 2021-10-15 LAB — MAGNESIUM: Magnesium: 2.1 mg/dL (ref 1.7–2.4)

## 2021-10-15 LAB — RENAL FUNCTION PANEL
Albumin: 3.3 g/dL — ABNORMAL LOW (ref 3.5–5.0)
Anion gap: 16 — ABNORMAL HIGH (ref 5–15)
BUN: 112 mg/dL — ABNORMAL HIGH (ref 8–23)
CO2: 20 mmol/L — ABNORMAL LOW (ref 22–32)
Calcium: 6.9 mg/dL — ABNORMAL LOW (ref 8.9–10.3)
Chloride: 97 mmol/L — ABNORMAL LOW (ref 98–111)
Creatinine, Ser: 6.93 mg/dL — ABNORMAL HIGH (ref 0.44–1.00)
GFR, Estimated: 6 mL/min — ABNORMAL LOW (ref >60)
Glucose, Bld: 174 mg/dL — ABNORMAL HIGH (ref 70–99)
Phosphorus: 6.7 mg/dL — ABNORMAL HIGH (ref 2.5–4.6)
Potassium: 3.4 mmol/L — ABNORMAL LOW (ref 3.5–5.1)
Sodium: 133 mmol/L — ABNORMAL LOW (ref 135–145)

## 2021-10-15 LAB — GLUCOSE, CAPILLARY
Glucose-Capillary: 165 mg/dL — ABNORMAL HIGH (ref 70–99)
Glucose-Capillary: 176 mg/dL — ABNORMAL HIGH (ref 70–99)
Glucose-Capillary: 282 mg/dL — ABNORMAL HIGH (ref 70–99)
Glucose-Capillary: 196 mg/dL — ABNORMAL HIGH (ref 70–99)

## 2021-10-15 LAB — HEMOGLOBIN A1C
Hgb A1c MFr Bld: 7.6 % — ABNORMAL HIGH (ref 4.8–5.6)
Mean Plasma Glucose: 171.42 mg/dL

## 2021-10-15 LAB — PROTIME-INR
INR: 1.3 — ABNORMAL HIGH (ref 0.8–1.2)
Prothrombin Time: 15.8 seconds — ABNORMAL HIGH (ref 11.4–15.2)

## 2021-10-15 LAB — CBG MONITORING, ED: Glucose-Capillary: 151 mg/dL — ABNORMAL HIGH (ref 70–99)

## 2021-10-15 SURGERY — ARTERIOVENOUS (AV) FISTULA CREATION
Anesthesia: Monitor Anesthesia Care | Site: Arm Upper | Laterality: Right

## 2021-10-15 MED ORDER — CEFAZOLIN SODIUM-DEXTROSE 2-4 GM/100ML-% IV SOLN
2.0000 g | INTRAVENOUS | Status: AC
Start: 2021-10-16 — End: 2021-10-16
  Administered 2021-10-15: 2 g via INTRAVENOUS
  Filled 2021-10-15: qty 100

## 2021-10-15 MED ORDER — ONDANSETRON HCL 4 MG/2ML IJ SOLN
INTRAMUSCULAR | Status: AC
  Filled 2021-10-15: qty 2

## 2021-10-15 MED ORDER — CHLORHEXIDINE GLUCONATE 0.12 % MT SOLN
15.0000 mL | Freq: Once | OROMUCOSAL | Status: AC
  Administered 2021-10-15: 15 mL via OROMUCOSAL

## 2021-10-15 MED ORDER — HEPARIN SODIUM (PORCINE) 1000 UNIT/ML IJ SOLN
INTRAMUSCULAR | Status: DC | PRN
  Administered 2021-10-15: 6000 [IU] via INTRAVENOUS

## 2021-10-15 MED ORDER — HEPARIN 6000 UNIT IRRIGATION SOLUTION
Status: DC | PRN
  Administered 2021-10-15: 1

## 2021-10-15 MED ORDER — PROTAMINE SULFATE 10 MG/ML IV SOLN
INTRAVENOUS | Status: DC | PRN
  Administered 2021-10-15 (×4): 10 mg via INTRAVENOUS

## 2021-10-15 MED ORDER — MIDAZOLAM HCL 2 MG/2ML IJ SOLN
INTRAMUSCULAR | Status: AC
  Filled 2021-10-15: qty 2

## 2021-10-15 MED ORDER — PROPOFOL 500 MG/50ML IV EMUL
INTRAVENOUS | Status: DC | PRN
  Administered 2021-10-15: 50 ug/kg/min via INTRAVENOUS

## 2021-10-15 MED ORDER — OXYCODONE-ACETAMINOPHEN 5-325 MG PO TABS
1.0000 | ORAL_TABLET | Freq: Four times a day (QID) | ORAL | Status: DC | PRN
  Administered 2021-10-15 – 2021-10-18 (×5): 1 via ORAL
  Filled 2021-10-15 (×5): qty 1

## 2021-10-15 MED ORDER — HEPARIN 6000 UNIT IRRIGATION SOLUTION
Status: AC
  Filled 2021-10-15: qty 500

## 2021-10-15 MED ORDER — ORAL CARE MOUTH RINSE: 15.0000 mL | Freq: Once | OROMUCOSAL | Status: AC

## 2021-10-15 MED ORDER — LIDOCAINE-EPINEPHRINE (PF) 1 %-1:200000 IJ SOLN
INTRAMUSCULAR | Status: DC | PRN
Start: 2021-10-15 — End: 2021-10-15
  Administered 2021-10-15: 35 mL

## 2021-10-15 MED ORDER — SODIUM CHLORIDE 0.9 % IV SOLN: INTRAVENOUS | Status: DC

## 2021-10-15 MED ORDER — LIDOCAINE-EPINEPHRINE (PF) 1 %-1:200000 IJ SOLN
INTRAMUSCULAR | Status: AC
  Filled 2021-10-15: qty 30

## 2021-10-15 MED ORDER — CEFAZOLIN SODIUM-DEXTROSE 2-4 GM/100ML-% IV SOLN
INTRAVENOUS | Status: AC
  Filled 2021-10-15: qty 100

## 2021-10-15 MED ORDER — LIDOCAINE HCL (PF) 1 % IJ SOLN
INTRAMUSCULAR | Status: AC
  Filled 2021-10-15: qty 30

## 2021-10-15 MED ORDER — FENTANYL CITRATE (PF) 100 MCG/2ML IJ SOLN
INTRAMUSCULAR | Status: AC
  Filled 2021-10-15: qty 2

## 2021-10-15 MED ORDER — 0.9 % SODIUM CHLORIDE (POUR BTL) OPTIME
TOPICAL | Status: DC | PRN
Start: 2021-10-15 — End: 2021-10-15
  Administered 2021-10-15: 1000 mL

## 2021-10-15 MED ORDER — LIDOCAINE HCL (PF) 2 % IJ SOLN
INTRAMUSCULAR | Status: DC | PRN
  Administered 2021-10-15: 400 mg via PERINEURAL

## 2021-10-15 MED ORDER — FENTANYL CITRATE (PF) 100 MCG/2ML IJ SOLN: 25.0000 ug | INTRAMUSCULAR | Status: DC | PRN

## 2021-10-15 MED ORDER — ONDANSETRON HCL 4 MG/2ML IJ SOLN
INTRAMUSCULAR | Status: DC | PRN
  Administered 2021-10-15: 4 mg via INTRAVENOUS

## 2021-10-15 MED ORDER — ACETAMINOPHEN 10 MG/ML IV SOLN: 1000.0000 mg | Freq: Once | INTRAVENOUS | Status: DC | PRN

## 2021-10-15 MED ORDER — INSULIN ASPART 100 UNIT/ML IJ SOLN: 0.0000 [IU] | INTRAMUSCULAR | Status: DC | PRN

## 2021-10-15 SURGICAL SUPPLY — 34 items
ARMBAND PINK RESTRICT EXTREMIT (MISCELLANEOUS) ×4 IMPLANT
BAG COUNTER SPONGE SURGICOUNT (BAG) ×2 IMPLANT
CANISTER SUCT 3000ML PPV (MISCELLANEOUS) ×2 IMPLANT
CANNULA VESSEL 3MM 2 BLNT TIP (CANNULA) ×2 IMPLANT
CLIP VESOCCLUDE MED 6/CT (CLIP) ×2 IMPLANT
CLIP VESOCCLUDE SM WIDE 6/CT (CLIP) ×3 IMPLANT
COVER PROBE W GEL 5X96 (DRAPES) IMPLANT
DECANTER SPIKE VIAL GLASS SM (MISCELLANEOUS) ×2 IMPLANT
DERMABOND ADVANCED (GAUZE/BANDAGES/DRESSINGS) ×1
DERMABOND ADVANCED .7 DNX12 (GAUZE/BANDAGES/DRESSINGS) ×1 IMPLANT
ELECT REM PT RETURN 9FT ADLT (ELECTROSURGICAL) ×2
ELECTRODE REM PT RTRN 9FT ADLT (ELECTROSURGICAL) ×1 IMPLANT
GLOVE SRG 8 PF TXTR STRL LF DI (GLOVE) ×1 IMPLANT
GLOVE SURG ENC MOIS LTX SZ7.5 (GLOVE) ×2 IMPLANT
GLOVE SURG POLY ORTHO LF SZ7.5 (GLOVE) IMPLANT
GLOVE SURG UNDER LTX SZ8 (GLOVE) ×2 IMPLANT
GLOVE SURG UNDER POLY LF SZ8 (GLOVE) ×2
GOWN STRL REUS W/ TWL LRG LVL3 (GOWN DISPOSABLE) ×3 IMPLANT
GOWN STRL REUS W/TWL LRG LVL3 (GOWN DISPOSABLE) ×6
GRAFT GORETEX STRT 4-7X45 (Vascular Products) ×1 IMPLANT
KIT BASIN OR (CUSTOM PROCEDURE TRAY) ×2 IMPLANT
KIT TURNOVER KIT B (KITS) ×2 IMPLANT
NS IRRIG 1000ML POUR BTL (IV SOLUTION) ×2 IMPLANT
PACK CV ACCESS (CUSTOM PROCEDURE TRAY) ×2 IMPLANT
PAD ARMBOARD 7.5X6 YLW CONV (MISCELLANEOUS) ×4 IMPLANT
SLING ARM FOAM STRAP LRG (SOFTGOODS) IMPLANT
SLING ARM FOAM STRAP MED (SOFTGOODS) IMPLANT
SUT MNCRL AB 4-0 PS2 18 (SUTURE) ×3 IMPLANT
SUT PROLENE 6 0 BV (SUTURE) ×5 IMPLANT
SUT VIC AB 3-0 SH 27 (SUTURE) ×4
SUT VIC AB 3-0 SH 27X BRD (SUTURE) ×1 IMPLANT
TOWEL GREEN STERILE (TOWEL DISPOSABLE) ×2 IMPLANT
UNDERPAD 30X36 HEAVY ABSORB (UNDERPADS AND DIAPERS) ×2 IMPLANT
WATER STERILE IRR 1000ML POUR (IV SOLUTION) ×2 IMPLANT

## 2021-10-15 NOTE — Transfer of Care (Signed)
Immediate Anesthesia Transfer of Care Note ? ?Patient: Paula [name] ? ?Procedure(s) Performed: ARTERIOVENOUS (AV) FISTULA  WITH PLACEMENT OF GORE-TEX STRETCH  GRAFT (4-79mx45cm) (Right: Arm Upper) ? ?Patient Location: PACU ? ?Anesthesia Type:MAC ? ?Level of Consciousness: drowsy ? ?Airway & Oxygen Therapy: Patient Spontanous Breathing and Patient connected to nasal cannula oxygen ? ?Post-op Assessment: Report given to RN and Post -op Vital signs reviewed and stable ? ?Post vital signs: Reviewed and stable ? ?Last Vitals:  ?Vitals Value Taken Time  ?BP 96/59 10/15/21 1318  ?Temp    ?Pulse 59 10/15/21 1320  ?Resp 12 10/15/21 1320  ?SpO2 97 % 10/15/21 1320  ?Vitals shown include unvalidated device data. ? ?Last Pain:  ?Vitals:  ? 10/15/21 1115  ?TempSrc:   ?PainSc: 0-No pain  ?   ? ?Patients Stated Pain Goal: 3 (10/15/21 1115) ? ?Complications: No notable events documented. ?

## 2021-10-15 NOTE — Discharge Instructions (Signed)
? ?  [facility] ? ?Discharge Instructions ? ?AV Fistula or Graft Surgery for Dialysis Access ? ?Please refer to the following instructions for your post-procedure care. Your surgeon or physician assistant will discuss any changes with you. ? ?Activity ? ?You may drive the day following your surgery, if you are comfortable and no longer taking prescription pain medication. Resume full activity as the soreness in your incision resolves. ? ?Bathing/Showering ? ?You may shower after you go home. Keep your incision dry for 48 hours. Do not soak in a bathtub, hot tub, or swim until the incision heals completely. You may not shower if you have a hemodialysis catheter. ? ?Incision Care ? ?Clean your incision with mild soap and water after 48 hours. Pat the area dry with a clean towel. You do not need a bandage unless otherwise instructed. Do not apply any ointments or creams to your incision. You may have skin glue on your incision. Do not peel it off. It will come off on its own in about one week. Your arm may swell a bit after surgery. To reduce swelling use pillows to elevate your arm so it is above your heart. Your doctor will tell you if you need to lightly wrap your arm with an ACE bandage. ? ?Diet ? ?Resume your normal diet. There are not special food restrictions following this procedure. In order to heal from your surgery, it is CRITICAL to get adequate nutrition. Your body requires vitamins, minerals, and protein. Vegetables are the best source of vitamins and minerals. Vegetables also provide the perfect balance of protein. Processed food has little nutritional value, so try to avoid this. ? ?Medications ? ?Resume taking all of your medications. If your incision is causing pain, you may take over-the counter pain relievers such as acetaminophen (Tylenol). If you were prescribed a stronger pain medication, please be aware these medications can cause nausea and constipation. Prevent  nausea by taking the medication with a snack or meal. Avoid constipation by drinking plenty of fluids and eating foods with high amount of fiber, such as fruits, vegetables, and grains.  ?Do not take Tylenol if you are taking prescription pain medications. ? ?Follow up ?Your surgeon may want to see you in the office following your access surgery. If so, this will be arranged at the time of your surgery. ? ?Please call us immediately for any of the following conditions: ? ?Increased pain, redness, drainage (pus) from your incision site ?Fever of 101 degrees or higher ?Severe or worsening pain at your incision site ?Hand pain or numbness. ? ?Reduce your risk of vascular disease: ? ?Stop smoking. If you would like help, call [facility] at 1-800-QUIT-NOW 902-489-7289) or [facility] at [phone] ? ?Manage your cholesterol ?Maintain a desired weight ?Control your diabetes ?Keep your blood pressure down ? ?Dialysis ? ?It will take several weeks to several months for your new dialysis access to be ready for use. Your surgeon will determine when it is okay to use it. Your nephrologist will continue to direct your dialysis. You can continue to use your Permcath until your new access is ready for use. ? ? ?10/15/2021 ?[place] ?[id number] ?[birth year] ? ?Surgeon(s): ?Angelia Mould, MD ? ?Procedure(s): ?ARTERIOVENOUS (AV) FISTULA  WITH PLACEMENT OF GORE-TEX STRETCH  GRAFT (4-68mmx45cm) ? ?x Do not stick graft for 4 weeks  ? ? ?If you have any questions, please call the office at [phone]. ? ?

## 2021-10-15 NOTE — Consult Note (Addendum)
[facility] [place] CONSULT NOTE VASCULAR SURGERY ASSESSMENT & PLAN:   STAGE V CHRONIC KIDNEY DISEASE: I have discussed the case with Dr. [name].  He would like [place] to proceed with placement of new access.  She does not need a catheter now.  Her best chance for a fistula would be the basilic vein on the right.  I have discussed placement of a basilic vein transposition which would be done in 2 stages.  If this vein were not adequate we would place an AV graft.  I have discussed the indications for the procedure and the potential complications with the patient and she is agreeable to proceed.  She has not eaten today so we will proceed this morning.  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  We have been asked to provide permanent access.    She was seen in our office previously by DR. Donzetta Matters 7/22.  She has not had HD previously.  She does not have any previous chest or breast surgery but did have bilateral lymphadenopathy when she was hospitalized in [place] many years ago.  She has no upper extremity swelling.  She is right-hand dominant.  After reviewing the vein mapping DR. Donzetta Matters was oplanning AV graft due to small vein size.     Past medical history significant for HTN, HLD, DM 2, chronic diastolic congestive heart failure, and cerebral aneurysm.  She reported to the ED with SOB, loss of appetite and dizziness. Her Cr was elevated to 6.99     Current Facility-Administered Medications  Medication Dose Route Frequency Provider Last Rate Last Admin   acetaminophen (TYLENOL) tablet 650 mg  650 mg Oral Q6H PRN [place] Eric J, DO       Or   acetaminophen (TYLENOL) suppository 650 mg  650 mg Rectal Q6H PRN [place] J, DO       albuterol (PROVENTIL) (2.5 MG/3ML) 0.083% nebulizer  solution 2.5 mg  2.5 mg Nebulization Q4H PRN [place] Eric J, DO       amLODipine (NORVASC) tablet 5 mg  5 mg Oral Daily [place] Donnamarie Poag, DO   5 mg at 10/14/21 2006   atorvastatin (LIPITOR) tablet 80 mg  80 mg Oral Daily [place] Eric J, DO       carvedilol (COREG) tablet 12.5 mg  12.5 mg Oral BID WC [place] Eric J, DO       furosemide (LASIX) injection 80 mg  80 mg Intravenous Q8H [place] Donnamarie Poag, DO   80 mg at 10/15/21 2947   gabapentin (NEURONTIN) capsule 100 mg  100 mg Oral TID [place] Eric J, DO   100 mg at 10/14/21 2306   hydrALAZINE (APRESOLINE) tablet 25 mg  25 mg Oral Q8H PRN [place] J, DO       insulin aspart (novoLOG) injection 0-5 Units  0-5 Units Subcutaneous QHS [place] Eric J, DO   3 Units at 10/14/21 2315   insulin aspart (novoLOG) injection 0-6 Units  0-6 Units Subcutaneous TID WC [place] Eric J, DO       isosorbide mononitrate (IMDUR) 24 hr tablet 60 mg  60 mg Oral Daily [place] Eric J, DO       loratadine (CLARITIN) tablet 10 mg  10 mg Oral Daily [place] Donnamarie Poag, DO  ondansetron (ZOFRAN) tablet 4 mg  4 mg Oral Q6H PRN [place] Donnamarie Poag, DO       Or   ondansetron [facility]) injection 4 mg  4 mg Intravenous Q6H PRN [place] Eric J, DO       polyethylene glycol (MIRALAX / GLYCOLAX) packet 17 g  17 g Oral Daily PRN [place] Eric J, DO       polyvinyl alcohol (LIQUIFILM TEARS) 1.4 % ophthalmic solution 1 drop  1 drop Both Eyes PRN [place] Donnamarie Poag, DO       Current Outpatient Medications  Medication Sig Dispense Refill   acetaminophen (TYLENOL) 500 MG tablet Take 500 mg by mouth every 6 (six) hours as needed for fever, moderate pain or mild pain.     albuterol (PROAIR HFA) 108 (90 Base) MCG/ACT inhaler Inhale 1-2 puffs into the lungs every 6 (six) hours as needed for wheezing or shortness of breath. 8.5 g 2   amLODipine (NORVASC) 5 MG tablet Take 1 tablet (5 mg total) by mouth daily. 90 tablet 1   aspirin EC 81 MG tablet Take 81 mg by mouth every evening.      atorvastatin (LIPITOR) 80 MG tablet Take 1 tablet (80 mg total) by mouth daily. 90 tablet 1   carvedilol (COREG) 12.5 MG tablet Take 1 tablet (12.5 mg total) by mouth 2 (two) times daily with a meal. 180 tablet 1   dextromethorphan (DELSYM) 30 MG/5ML liquid Take 30 mg by mouth daily as needed for cough.     fluticasone (FLONASE) 50 MCG/ACT nasal spray Place 2 sprays into both nostrils daily. 16 g 2   furosemide (LASIX) 40 MG tablet Take 2 tablets (80 mg total) by mouth 2 (two) times daily. (Patient taking differently: Take 40 mg by mouth 2 (two) times daily.) 120 tablet 0   gabapentin (NEURONTIN) 100 MG capsule Take 100 mg by mouth 3 (three) times daily.     glipiZIDE (GLUCOTROL) 5 MG tablet Take 0.5 tablets (2.5 mg total) by mouth 2 (two) times daily before a meal. 90 tablet 1   Homeopathic Products (EARACHE DROPS OT) Place 1 drop into both ears daily.     isosorbide mononitrate (IMDUR) 60 MG 24 hr tablet TAKE 1 TABLET (60 MG TOTAL) BY MOUTH DAILY. 90 tablet 1   loratadine (CLARITIN) 10 MG tablet Take 1 tablet (10 mg total) by mouth daily. 30 tablet 11   Polyethyl Glycol-Propyl Glycol (SYSTANE OP) Place 1 drop into both eyes daily.     calcitRIOL (ROCALTROL) 0.25 MCG capsule Take 0.25 mcg by mouth daily. (Patient not taking: Reported on 10/14/2021)     Darbepoetin Alfa (ARANESP) 150 MCG/0.3ML SOSY injection Inject 0.3 mLs (150 mcg total) into the skin every Monday at 6 PM. (Patient not taking: Reported on 10/14/2021) 1.68 mL    glucose blood (ACCU-CHEK GUIDE) test strip Use as instructed 3 times daily 100 each 12   guaiFENesin-dextromethorphan (ROBITUSSIN DM) 100-10 MG/5ML syrup Take 10 mLs by mouth every 4 (four) hours as needed for cough. (Patient not taking: Reported on 10/14/2021) 118 mL 0   Misc. Devices [employer].  Diagnosis - unstable gait 1 each 0    Pt meds include: Statin :Yes Betablocker: Yes ASA: Yes Other anticoagulants/antiplatelets: None  Past Medical History:  Diagnosis Date    Allergic rhinitis    Arthritis    Asthma    Brain aneurysm    Chronic kidney disease    Cough    Diabetes mellitus  Diastolic CHF, chronic ([place]) 10/11/2011   GERD (gastroesophageal reflux disease)    History of colon polyps 2012   tubular adenoma    Hyperlipidemia    Hypertension    Neuropathy 10/11/2011   PONV (postoperative nausea and vomiting)    one time after lymph node surgery    Past Surgical History:  Procedure Laterality Date   CATARACT EXTRACTION     right eye   IR 3D INDEPENDENT WKST  12/14/2017   IR ANGIO INTRA EXTRACRAN SEL COM CAROTID INNOMINATE BILAT MOD SED  09/15/2017   IR ANGIO INTRA EXTRACRAN SEL COM CAROTID INNOMINATE UNI L MOD SED  12/14/2017   IR ANGIO VERTEBRAL SEL VERTEBRAL BILAT MOD SED  09/15/2017   IR RADIOLOGIST EVAL & MGMT  09/10/2017   IR RADIOLOGIST EVAL & MGMT  10/19/2017   lymphatic mass surgery     NASAL TURBINATE REDUCTION     RADIOLOGY WITH ANESTHESIA N/A 12/14/2017   Procedure: RADIOLOGY WITH ANESTHESIA EMBOLIZATION;  Surgeon: Luanne Bras, MD;  Location: [facility];  Service: Radiology;  Laterality: N/A;   VIDEO BRONCHOSCOPY Bilateral 11/18/2017   Procedure: VIDEO BRONCHOSCOPY WITHOUT FLUORO;  Surgeon: Collene Gobble, MD;  Location: [facility];  Service: Cardiopulmonary;  Laterality: Bilateral;    Social History Social History   Tobacco Use   Smoking status: Never   Smokeless tobacco: Never  Vaping Use   Vaping Use: Never used  Substance Use Topics   Alcohol use: No   Drug use: No    Family History Family History  Problem Relation Age of Onset   Hypertension Mother    Asthma Sister    Colon cancer Neg Hx    Allergic rhinitis Neg Hx    Eczema Neg Hx     Allergies  Allergen Reactions   Dust Mite Extract Cough   Ace Inhibitors Cough   Lovastatin Other (See Comments)    Generalized body pain     REVIEW OF SYSTEMS  General: [ ]  Weight loss, [ ]  Fever, [ ]  chills Neurologic: [ ]  Dizziness, [ ]  Blackouts, [ ]   Seizure [ ]  Stroke, [ ]  "Mini stroke", [ ]  Slurred speech, [ ]  Temporary blindness; [ ]  weakness in arms or legs, [ ]  Hoarseness [ ]  Dysphagia Cardiac: [ ]  Chest pain/pressure, [ ]  Shortness of breath at rest [ ]  Shortness of breath with exertion, [ ]  Atrial fibrillation or irregular heartbeat  Vascular: [ ]  Pain in legs with walking, [ ]  Pain in legs at rest, [ ]  Pain in legs at night,  [ ]  Non-healing ulcer, [ ]  Blood clot in vein/DVT,   Pulmonary: [ ]  Home oxygen, [ ]  Productive cough, [ ]  Coughing up blood, [ ]  Asthma,  [ ]  Wheezing [ ]  COPD Musculoskeletal:  [ ]  Arthritis, [ ]  Low back pain, [ ]  Joint pain Hematologic: [ ]  Easy Bruising, [ ]  Anemia; [ ]  Hepatitis Gastrointestinal: [ ]  Blood in stool, [ ]  Gastroesophageal Reflux/heartburn, Urinary: [x]  chronic Kidney disease, [ ]  on HD - [ ]  MWF or [ ]  TTHS, [ ]  Burning with urination, [ ]  Difficulty urinating Skin: [ ]  Rashes, [ ]  Wounds Psychological: [ ]  Anxiety, [ ]  Depression  Physical Examination Vitals:   10/15/21 0100 10/15/21 0300 10/15/21 0600 10/15/21 0755  BP: (!) 128/56 (!) 157/85 (!) 154/77 (!) 146/62  Pulse: 65 76 67 69  Resp: 17 17 14 19   Temp:      TempSrc:      SpO2: 98%  98% 100% 97%  Weight:      Height:       Body mass index is 26.61 kg/m.  General:  WDWN in NA HENT: WNL Eyes: Pupils equal Pulmonary: normal non-labored breathing , without Rales, rhonchi,  wheezing Cardiac: RRR, without  Murmurs, rubs or gallops; No carotid bruits Abdomen: soft, NT, no masses Skin: no rashes, ulcers noted;  no Gangrene , no cellulitis; no open wounds;   Vascular Exam/Pulses:radial pulses B palpable   Musculoskeletal: no muscle wasting or atrophy; no edema  Neurologic: A&O X 3; Appropriate Affect ;  SENSATION: normal; MOTOR FUNCTION: 5/5 Symmetric Speech is fluent/normal   Significant Diagnostic Studies: CBC Lab Results  Component Value Date   WBC 5.0 10/14/2021   HGB 10.3 (L) 10/14/2021   HCT 30.0 (L)  10/14/2021   MCV 82.9 10/14/2021   PLT 169 10/14/2021    BMET    Component Value Date/Time   NA 133 (L) 10/15/2021 0429   NA 136 10/01/2021 1032   K 3.4 (L) 10/15/2021 0429   CL 97 (L) 10/15/2021 0429   CO2 20 (L) 10/15/2021 0429   GLUCOSE 174 (H) 10/15/2021 0429   BUN 112 (H) 10/15/2021 0429   BUN 84 (HH) 10/01/2021 1032   CREATININE 6.93 (H) 10/15/2021 0429   CREATININE 1.52 (H) 06/27/2016 1152   CALCIUM 6.9 (L) 10/15/2021 0429   GFRNONAA 6 (L) 10/15/2021 0429   GFRNONAA 35 (L) 06/27/2016 1152   GFRAA 22 (L) 09/14/2019 0912   GFRAA 40 (L) 06/27/2016 1152   Estimated Creatinine Clearance: 6.3 mL/min (A) (by C-G formula based on SCr of 6.93 mg/dL (H)).  COAG Lab Results  Component Value Date   INR 1.3 (H) 10/15/2021   INR 1.1 12/29/2020   INR 1.1 03/26/2019     Non-Invasive Vascular Imaging:  03/01/21 +-----------------+-------------+----------+--------------+   Right Cephalic    Diameter (cm) Depth (cm)    Findings      +-----------------+-------------+----------+--------------+   Shoulder              0.18         0.70                     +-----------------+-------------+----------+--------------+   Prox upper arm        0.16         0.38                     +-----------------+-------------+----------+--------------+   Mid upper arm         0.16         0.28                     +-----------------+-------------+----------+--------------+   Dist upper arm        0.09         0.37                     +-----------------+-------------+----------+--------------+   Antecubital fossa                          not visualized   +-----------------+-------------+----------+--------------+   Prox forearm          0.16         0.23                     +-----------------+-------------+----------+--------------+   Mid forearm  0.11         0.21                     +-----------------+-------------+----------+--------------+   Dist forearm          0.10         0.13                      +-----------------+-------------+----------+--------------+   Wrist                                      not visualized   +-----------------+-------------+----------+--------------+   +-----------------+-------------+----------+--------------+   Right Basilic     Diameter (cm) Depth (cm)    Findings      +-----------------+-------------+----------+--------------+   Prox upper arm        0.40         1.47                     +-----------------+-------------+----------+--------------+   Mid upper arm         0.24         1.17                     +-----------------+-------------+----------+--------------+   Dist upper arm        0.32         0.81      branching      +-----------------+-------------+----------+--------------+   Antecubital fossa     0.29         1.11                     +-----------------+-------------+----------+--------------+   Prox forearm          0.22         1.62      branching      +-----------------+-------------+----------+--------------+   Mid forearm                                not visualized   +-----------------+-------------+----------+--------------+   Distal forearm                             not visualized   +-----------------+-------------+----------+--------------+   Wrist                                      not visualized   +-----------------+-------------+----------+--------------+   +-----------------+-------------+----------+---------+   Left Cephalic     Diameter (cm) Depth (cm) Findings    +-----------------+-------------+----------+---------+   Shoulder              0.08         0.33                +-----------------+-------------+----------+---------+   Prox upper arm        0.10         0.32                +-----------------+-------------+----------+---------+   Mid upper arm         0.07         0.33                +-----------------+-------------+----------+---------+  Dist upper arm        0.09         0.37                 +-----------------+-------------+----------+---------+   Antecubital fossa     0.09         0.22                +-----------------+-------------+----------+---------+   Prox forearm          0.09         0.23                +-----------------+-------------+----------+---------+   Mid forearm           0.08         0.23                +-----------------+-------------+----------+---------+   Dist forearm          0.11         0.28                +-----------------+-------------+----------+---------+   Wrist                 0.09         0.35    branching   +-----------------+-------------+----------+---------+   +-----------------+-------------+----------+----------------+   Left Basilic      Diameter (cm) Depth (cm)     Findings       +-----------------+-------------+----------+----------------+   Prox upper arm        0.33         1.10                       +-----------------+-------------+----------+----------------+   Mid upper arm         0.31         1.32       branching       +-----------------+-------------+----------+----------------+   Dist upper arm        0.26         1.16                       +-----------------+-------------+----------+----------------+   Antecubital fossa     0.21         1.05                       +-----------------+-------------+----------+----------------+   Prox forearm          0.22         1.63    chronic thrombus   +-----------------+-------------+----------+----------------+   Mid forearm                                 not visualized    +-----------------+-------------+----------+----------------+   Distal forearm                              not visualized    +-----------------+-------------+----------+----------------+   Wrist                                       not visualized    +-----------------+-------------+----------+----------------+   Summary: Right: Thrombus noted in one of the paired veins immediately  after  brachial  artery bifurcation (upper arm- high bifurcation).  Left: Thrombus noted in the basilic vein in the proximal forearm.   ASSESSMENT/PLAN:  CKD stage V Plan will be for right UE av fistula verses graft.  She has a marginal basilic vein on the right.   After discussion with Dr. [name] the plan will not be to place a [facility] at this time.  She does not need emergent HD currently. NPO plan for OR today with Dr. Scot Dock.   Roxy Horseman 10/15/2021 8:11 AM

## 2021-10-15 NOTE — Anesthesia Postprocedure Evaluation (Signed)
Anesthesia Post Note ? ?Patient: Paula [name] ? ?Procedure(s) Performed: ARTERIOVENOUS (AV) FISTULA  WITH PLACEMENT OF GORE-TEX STRETCH  GRAFT (4-2mx45cm) (Right: Arm Upper) ? ?  ? ?Patient location during evaluation: PACU ?Anesthesia Type: Regional and MAC ?Level of consciousness: awake and alert ?Pain management: pain level controlled ?Vital Signs Assessment: post-procedure vital signs reviewed and stable ?Respiratory status: spontaneous breathing, nonlabored ventilation, respiratory function stable and patient connected to nasal cannula oxygen ?Cardiovascular status: stable and blood pressure returned to baseline ?Postop Assessment: no apparent nausea or vomiting ?Anesthetic complications: no ? ? ?No notable events documented. ? ?Last Vitals:  ?Vitals:  ? 10/15/21 1405 10/15/21 1420  ?BP: (!) 102/57 (!) 129/56  ?Pulse: (!) 56 (!) 56  ?Resp: 15 14  ?Temp:  36.5 ?C  ?SpO2: 96% 100%  ?  ?Last Pain:  ?Vitals:  ? 10/15/21 1420  ?TempSrc:   ?PainSc: Asleep  ? ? ?  ?  ?  ?  ?  ?  ? ?[name] ? ? ? ? ?

## 2021-10-15 NOTE — Progress Notes (Signed)
PROGRESS NOTE    Paula [name]  QIO:[id number] DOB: 01/07/1946 DOA: 10/14/2021 PCP: Charlott Rakes, MD    Brief Narrative:  Paula [name] is a 76 year old female with past medical history significant for HTN, HLD, DM 2, chronic diastolic congestive heart failure, cerebral aneurysm, CKD stage V who presented to [facility] ED on 3/6 via EMS with complaint of shortness of breath, dizziness, malaise, loss of appetite.  Patient reports onset for several months, has been progressing significantly over the last several days.  Patient reports symptoms of shortness of breath worse at night and affecting her ability to sleep.  Patient has been followed by nephrology outpatient, not yet on dialysis but admits still making urine.  In the ED, temperature 97.4 F, HR 61, RR 14, BP 154/70, SPO2 98% on room air.  WBC 5.0, hemoglobin 10.0, platelets 169.  Sodium 132, potassium 3.1, chloride 95, CO2 22, glucose 201, BUN 106, creatinine 6.99.  BNP 54.4.  COVID-19 PCR negative.  Influenza A/B PCR negative.  Urinalysis with 100 protein, otherwise unrevealing.  Chest x-ray with no acute cardiopulmonary disease process.  ED physician discussed case with nephrology, Dr. [name] who recommended medicine admission and will likely need to initiate HD.  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     Assessment & Plan:   Assessment and Plan: * Acute renal failure superimposed on stage 5 chronic kidney disease, not on chronic dialysis [facility]) Patient presenting to the ED with progressive shortness of breath malaise.  Creatinine on admission 6.99 which has been steadily rising over the last several months.  BUN elevated 106, potassium 3.1.  Chest x-ray with no acute cardiopulmonary disease process, BNP within normal limits.  Nephrology has been recommending placement of HD catheter to start dialysis for some time.  Recent renal ultrasound 09/01/2021 with  increased renal cortical echogenicity consistent with chronic medical renal disease. --Cr 6.99>6.93 --UOP: Not measured over the last 24 hours --Furosemide 80 mg IV q8h --Bladder scan x1 to ensure no urinary retention --Strict I's and O's and daily weights --Renal function daily --N.p.o., vascular surgery plans right upper extremity AV fistula versus graft placement today --Further per nephrology  Hypokalemia Potassium 3.1 on admission, repleted. --Repeat electrolytes in the a.m. to include magnesium  Hyponatremia Sodium 132 on admission, in the setting of hyperglycemia with glucose 261.  Sodium corrected for hyperglycemia = 135.  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  Urine osmolality 262, urine sodium 21. --Renal function panel daily  Type 2 diabetes mellitus without complication, without long-term current use of insulin ([place]) Patient on glipizide 2.5 mg p.o. twice daily at baseline.  Hemoglobin A1c 7.6. --Hold oral hypoglycemics while inpatient --SSI for coverage --CBGs qAC/HS  Essential hypertension --Continue home amlodipine 5 mg p.o. daily, carvedilol 12.5 mg p.o. twice daily, Imdur 60 mg p.o. daily. --IV Lasix as above --Hydralazine 25 mg p.o. q8h PRN SBP >180 or DBP >110  Hyperlipidemia --Atorvastatin 80 mg p.o. daily  Chronic diastolic CHF (congestive heart failure) ([place]) TTE 02/12/2021 with LVEF 60 to 32%, grade 1 diastolic dysfunction, severe LVH, left atrial enlargement, no aortic stenosis.  BMP within normal limits on admission.  Chest x-ray with no pulmonary edema. --Continue furosemide as above --Repeat TTE --Strict I's and O's and daily weight  Physical deconditioning -- PT/OT consult: Pending     DVT prophylaxis: SCDs Start: 10/14/21 1919    Code  Status: Full Code Family Communication: Family present at bedside this morning  Disposition Plan:  Level of care: Med-Surg Status is: Inpatient Remains  inpatient appropriate because: Pending AV graft/fistula per vascular surgery today, further per nephrology, on IV furosemide    Consultants:  Nephrology, Dr. [name] Vascular surgery  Procedures:  Right upper extremity AV graft/fistula placement: Pending  Antimicrobials:  None   Subjective: Patient seen examined bedside, resting comfortably.  Asking when she can have something to eat.  Discussed with her that she will need dialysis access in which vascular surgery is performing graft versus fistula placement today.  No other specific complaints or concerns at this time.  Denies headache, no dizziness, no chest pain, no shortness of breath, no abdominal pain.  No acute events overnight per nurse staff.  Objective: Vitals:   10/15/21 0800 10/15/21 0852 10/15/21 0900 10/15/21 1006  BP: (!) 153/62  (!) 172/86 (!) 170/80  Pulse: 66  71 67  Resp: 12  15 17   Temp:  97.7 F (36.5 C)  98 F (36.7 C)  TempSrc:  Oral  Oral  SpO2: 96%  100% 99%  Weight:    66.2 kg  Height:    5\' 2"  (1.575 m)   No intake or output data in the 24 hours ending 10/15/21 1030 Filed Weights   10/14/21 1248 10/15/21 1006  Weight: 66 kg 66.2 kg    Examination:  Physical Exam: GEN: NAD, alert and oriented x 3, chronically ill in appearance HEENT: NCAT, PERRL, EOMI, sclera clear, MMM PULM: CTAB w/o wheezes/crackles, normal respiratory effort on room air CV: RRR w/o M/G/R GI: abd soft, NTND, NABS, no R/G/M MSK: no peripheral edema, muscle strength globally intact 5/5 bilateral upper/lower extremities NEURO: CN II-XII intact, no focal deficits, sensation to light touch intact PSYCH: normal mood/affect Integumentary: dry/intact, no rashes or wounds    Data Reviewed: I have personally reviewed following labs and imaging studies  CBC: Recent Labs  Lab 10/14/21 1256  WBC 5.0  HGB 10.3*  HCT 30.0*  MCV 82.9  PLT 242   Basic Metabolic Panel: Recent Labs  Lab 10/14/21 1256 10/15/21 0429  NA 132*  133*  K 3.1* 3.4*  CL 95* 97*  CO2 22 20*  GLUCOSE 201* 174*  BUN 106* 112*  CREATININE 6.99* 6.93*  CALCIUM 6.7* 6.9*  MG  --  2.1  PHOS  --  6.7*   GFR: Estimated Creatinine Clearance: 6.3 mL/min (A) (by C-G formula based on SCr of 6.93 mg/dL (H)). Liver Function Tests: Recent Labs  Lab 10/15/21 0429  ALBUMIN 3.3*   No results for input(s): LIPASE, AMYLASE in the last 168 hours. No results for input(s): AMMONIA in the last 168 hours. Coagulation Profile: Recent Labs  Lab 10/15/21 0429  INR 1.3*   Cardiac Enzymes: No results for input(s): CKTOTAL, CKMB, CKMBINDEX, TROPONINI in the last 168 hours. BNP (last 3 results) No results for input(s): PROBNP in the last 8760 hours. HbA1C: Recent Labs    10/15/21 0433  HGBA1C 7.6*   CBG: Recent Labs  Lab 10/14/21 1259 10/14/21 2211 10/15/21 0821 10/15/21 1008  GLUCAP 195* 300* 151* 165*   Lipid Profile: No results for input(s): CHOL, HDL, LDLCALC, TRIG, CHOLHDL, LDLDIRECT in the last 72 hours. Thyroid Function Tests: No results for input(s): TSH, T4TOTAL, FREET4, T3FREE, THYROIDAB in the last 72 hours. Anemia Panel: No results for input(s): VITAMINB12, FOLATE, FERRITIN, TIBC, IRON, RETICCTPCT in the last 72 hours. Sepsis Labs: No results for input(s): PROCALCITON,  LATICACIDVEN in the last 168 hours.  Recent Results (from the past 240 hour(s))  Resp Panel by RT-PCR (Flu A&B, Covid) Nasopharyngeal Swab     Status: None   Collection Time: 10/14/21  3:35 PM   Specimen: Nasopharyngeal Swab; Nasopharyngeal(NP) swabs in vial transport medium  Result Value Ref Range Status   SARS Coronavirus 2 by RT PCR NEGATIVE NEGATIVE Final    Comment: (NOTE) SARS-CoV-2 target nucleic acids are NOT DETECTED.  The SARS-CoV-2 RNA is generally detectable in upper respiratory specimens during the acute phase of infection. The lowest concentration of SARS-CoV-2 viral copies this assay can detect is 138 copies/mL. A negative result does  not preclude SARS-Cov-2 infection and should not be used as the sole basis for treatment or other patient management decisions. A negative result may occur with  improper specimen collection/handling, submission of specimen other than nasopharyngeal swab, presence of viral mutation(s) within the areas targeted by this assay, and inadequate number of viral copies(<138 copies/mL). A negative result must be combined with clinical observations, patient history, and epidemiological information. The expected result is Negative.  Fact Sheet for Patients:  EntrepreneurPulse.com.au  Fact Sheet for Healthcare Providers:  IncredibleEmployment.be  This test is no t yet approved or cleared by the [place] FDA and  has been authorized for detection and/or diagnosis of SARS-CoV-2 by FDA under an Emergency Use Authorization (EUA). This EUA will remain  in effect (meaning this test can be used) for the duration of the COVID-19 declaration under Section 564(b)(1) of the Act, 21 U.S.C.section 360bbb-3(b)(1), unless the authorization is terminated  or revoked sooner.       Influenza A by PCR NEGATIVE NEGATIVE Final   Influenza B by PCR NEGATIVE NEGATIVE Final    Comment: (NOTE) The Xpert Xpress SARS-CoV-2/FLU/RSV plus assay is intended as an aid in the diagnosis of influenza from Nasopharyngeal swab specimens and should not be used as a sole basis for treatment. Nasal washings and aspirates are unacceptable for Xpert Xpress SARS-CoV-2/FLU/RSV testing.  Fact Sheet for Patients: EntrepreneurPulse.com.au  Fact Sheet for Healthcare Providers: IncredibleEmployment.be  This test is not yet approved or cleared by the [place] FDA and has been authorized for detection and/or diagnosis of SARS-CoV-2 by FDA under an Emergency Use Authorization (EUA). This EUA will remain in effect (meaning this test can be used) for the  duration of the COVID-19 declaration under Section 564(b)(1) of the Act, 21 U.S.C. section 360bbb-3(b)(1), unless the authorization is terminated or revoked.  Performed at [facility], [place] [address], [place], [place] [address]          Radiology Studies: DG Chest Port 1 View  Result Date: 10/14/2021 CLINICAL DATA:  Shortness of breath. EXAM: PORTABLE CHEST 1 VIEW COMPARISON:  Chest x-ray 01/30/2022. FINDINGS: The heart size and mediastinal contours are within normal limits. Both lungs are clear. The visualized skeletal structures are unremarkable. IMPRESSION: No active disease. Electronically Signed   By: Ronney Asters M.D.   On: 10/14/2021 15:56        Scheduled Meds:  [MAR Hold] amLODipine  5 mg Oral Daily   [MAR Hold] atorvastatin  80 mg Oral Daily   [MAR Hold] carvedilol  12.5 mg Oral BID WC   [MAR Hold] furosemide  80 mg Intravenous Q8H   [MAR Hold] gabapentin  100 mg Oral TID   [MAR Hold] insulin aspart  0-5 Units Subcutaneous QHS   [MAR Hold] insulin aspart  0-6 Units Subcutaneous TID WC   [MAR  Hold] isosorbide mononitrate  60 mg Oral Daily   [MAR Hold] loratadine  10 mg Oral Daily   Continuous Infusions:  sodium chloride 10 mL/hr at 10/15/21 1025   [MAR Hold]  ceFAZolin (ANCEF) IV       LOS: 1 day    Time spent: 54 minutes spent on chart review, discussion with nursing staff, consultants, updating family and interview/physical exam; more than 50% of that time was spent in counseling and/or coordination of care.    Adryel Wortmann J [place] DO [facility] Available via Epic secure chat 7am-7pm After these hours, please refer to coverage provider listed on [email or web address] 10/15/2021, 10:30 AM

## 2021-10-15 NOTE — Anesthesia Procedure Notes (Signed)
Anesthesia Regional Block: Supraclavicular block  ? ?Pre-Anesthetic Checklist: , timeout performed,  Correct Patient, Correct Site, Correct Laterality,  Correct Procedure, Correct Position, site marked,  Risks and benefits discussed,  Surgical consent,  Pre-op evaluation,  At surgeon's request and post-op pain management ? ?Laterality: Right ? ?Prep: Dura Prep     ?  ?Needles:  ?Injection technique: Single-shot ? ?Needle Type: Echogenic Stimulator Needle   ? ? ?Needle Length: 5cm  ?Needle Gauge: 20  ? ? ? ?Additional Needles: ? ? ?Procedures:,,,, ultrasound used (permanent image in chart),,    ?Narrative:  ?Start time: 10/15/2021 10:57 AM ?End time: 10/15/2021 11:02 AM ?Injection made incrementally with aspirations every 5 mL. ? ?Performed by: Personally  ?Anesthesiologist: Darral Dash, DO ? ?Additional Notes: ?Patient identified. Risks/Benefits/Options discussed with patient including but not limited to bleeding, infection, nerve damage, failed block, incomplete pain control. Patient expressed understanding and wished to proceed. All questions were answered. Sterile technique was used throughout the entire procedure. Please see nursing notes for vital signs. Aspirated in 5cc intervals with injection for negative confirmation. Patient was given instructions on fall risk and not to get out of bed. All questions and concerns addressed with instructions to call with any issues or inadequate analgesia.   ?  ? ? ? ? ?

## 2021-10-15 NOTE — Anesthesia Procedure Notes (Signed)
Procedure Name: [place] ?Date/Time: 10/15/2021 11:30 AM ?Performed by: Eligha Bridegroom, CRNA ?Pre-anesthesia Checklist: Patient identified, Emergency Drugs available, Patient being monitored, Timeout performed and Suction available ?Patient Re-evaluated:Patient Re-evaluated prior to induction ?Oxygen Delivery Method: Nasal cannula ?Preoxygenation: Pre-oxygenation with 100% oxygen ?Induction Type: IV induction ? ? ? ? ?

## 2021-10-15 NOTE — Anesthesia Preprocedure Evaluation (Signed)
Anesthesia Evaluation  ?Patient identified by MRN, date of birth, ID band ?Patient awake ? ? ? ?Reviewed: ?Allergy & Precautions, NPO status , Patient's Chart, lab work & pertinent test results ? ?History of Anesthesia Complications ?(+) PONV and history of anesthetic complications ? ?Airway ?Mallampati: II ? ?TM Distance: >3 FB ?Neck ROM: Full ? ? ? Dental ? ?(+) Poor Dentition ?  ?Pulmonary ?asthma ,  ?  ?Pulmonary exam normal ? ? ? ? ? ? ? Cardiovascular ?hypertension, Pt. on medications and Pt. on home beta blockers ?+CHF  ? ?Rhythm:Regular Rate:Normal ? ?TTE 02/12/2021 with LVEF 60 to 38%, grade 1 diastolic dysfunction, severe LVH, left atrial enlargement, no aortic stenosis ?  ?Neuro/Psych ?negative neurological ROS ? negative psych ROS  ? GI/Hepatic ?Neg liver ROS, GERD  ,  ?Endo/Other  ?diabetes, Type 2, Oral Hypoglycemic Agents ? Renal/GU ?CRF and DialysisRenal disease  ?negative genitourinary ?  ?Musculoskeletal ? ?(+) Arthritis , Osteoarthritis,   ? Abdominal ?Normal abdominal exam  (+)   ?Peds ? Hematology ?negative hematology ROS ?(+)   ?Anesthesia Other Findings ? ? Reproductive/Obstetrics ? ?  ? ? ? ? ? ? ? ? ? ? ? ? ? ?  ?  ? ? ? ? ? ? ? ? ?Anesthesia Physical ?Anesthesia Plan ? ?ASA: 3 ? ?Anesthesia Plan: MAC and Regional  ? ?Post-op Pain Management: Regional block*  ? ?Induction: Intravenous ? ?PONV Risk Score and Plan: 3 and Propofol infusion, Treatment may vary due to age or medical condition, Ondansetron and Dexamethasone ? ?Airway Management Planned: Simple Face Mask, Natural Airway and Nasal Cannula ? ?Additional Equipment: None ? ?Intra-op Plan:  ? ?Post-operative Plan:  ? ?Informed Consent: I have reviewed the patients History and Physical, chart, labs and discussed the procedure including the risks, benefits and alternatives for the proposed anesthesia with the patient or authorized representative who has indicated his/her understanding and acceptance.   ? ? ? ?Dental advisory given ? ?Plan Discussed with: CRNA ? ?Anesthesia Plan Comments: (Lab Results ?     Component                Value               Date                 ?     WBC                      5.0                 10/14/2021           ?     HGB                      10.3 (L)            10/14/2021           ?     HCT                      30.0 (L)            10/14/2021           ?     MCV                      82.9                10/14/2021           ?  PLT                      169                 10/14/2021           ?Lab Results ?     Component                Value               Date                 ?     NA                       133 (L)             10/15/2021           ?     K                        3.4 (L)             10/15/2021           ?     CO2                      20 (L)              10/15/2021           ?     GLUCOSE                  174 (H)             10/15/2021           ?     BUN                      112 (H)             10/15/2021           ?     CREATININE               6.93 (H)            10/15/2021           ?     CALCIUM                  6.9 (L)             10/15/2021           ?     EGFR                     6 (L)               10/01/2021           ?     GFRNONAA                 6 (L)               10/15/2021          )  ? ? ? ? ? ? ?Anesthesia Quick Evaluation ? ?

## 2021-10-15 NOTE — Assessment & Plan Note (Addendum)
PT and OT recommended home PT ?

## 2021-10-15 NOTE — Consult Note (Addendum)
Referring Provider: No ref. provider found Primary Care Physician:  Charlott Rakes, MD Primary Nephrologist:  Dr. [name]  Reason for Consultation: Chronic kidney disease stage V, management of anemia, management secondary hyperparathyroidism.  Maintenance of euvolemia  HPI: This is a 76 year old lady with past medical history hypertension hyperlipidemia diabetes mellitus congestive heart failure with diastolic dysfunction cerebral aneurysm and CKD stage V.  Baseline serum creatinine has been about 5 mg to 7 mg/dL.  She has had multiple attempts to refer the patient to vein and vascular surgery for vascular access placement but has found reasons not to go.  She is interested in proceeding with hemodialysis at this point.  She presented to the emergency room with shortness of breath and congestive heart failure.  Consultation with vein and vascular surgery for placement of AV fistula or AVG. Hold on dialysis for now  Blood pressure 146/72 pulse 66 O2 sats 98% room air  Sodium 133 potassium 3.4 chloride 97 CO2 20 BUN 112 creatinine 6.93 glucose 174   Past Medical History:  Diagnosis Date   Allergic rhinitis    Arthritis    Asthma    Brain aneurysm    Chronic kidney disease    Cough    Diabetes mellitus    Diastolic CHF, chronic ([place]) 10/11/2011   GERD (gastroesophageal reflux disease)    History of colon polyps 2012   tubular adenoma    Hyperlipidemia    Hypertension    Neuropathy 10/11/2011   PONV (postoperative nausea and vomiting)    one time after lymph node surgery    Past Surgical History:  Procedure Laterality Date   CATARACT EXTRACTION     right eye   IR 3D INDEPENDENT WKST  12/14/2017   IR ANGIO INTRA EXTRACRAN SEL COM CAROTID INNOMINATE BILAT MOD SED  09/15/2017   IR ANGIO INTRA EXTRACRAN SEL COM CAROTID INNOMINATE UNI L MOD SED  12/14/2017   IR ANGIO VERTEBRAL SEL VERTEBRAL BILAT MOD SED  09/15/2017   IR RADIOLOGIST EVAL & MGMT  09/10/2017   IR RADIOLOGIST EVAL & MGMT   10/19/2017   lymphatic mass surgery     NASAL TURBINATE REDUCTION     RADIOLOGY WITH ANESTHESIA N/A 12/14/2017   Procedure: RADIOLOGY WITH ANESTHESIA EMBOLIZATION;  Surgeon: Luanne Bras, MD;  Location: [facility];  Service: Radiology;  Laterality: N/A;   VIDEO BRONCHOSCOPY Bilateral 11/18/2017   Procedure: VIDEO BRONCHOSCOPY WITHOUT FLUORO;  Surgeon: Collene Gobble, MD;  Location: [facility];  Service: Cardiopulmonary;  Laterality: Bilateral;    Prior to Admission medications   Medication Sig Start Date End Date Taking? Authorizing Provider  acetaminophen (TYLENOL) 500 MG tablet Take 500 mg by mouth every 6 (six) hours as needed for fever, moderate pain or mild pain.   Yes [provider]  albuterol (PROAIR HFA) 108 (90 Base) MCG/ACT inhaler Inhale 1-2 puffs into the lungs every 6 (six) hours as needed for wheezing or shortness of breath. 10/01/21  Yes Newlin, Enobong, MD  amLODipine (NORVASC) 5 MG tablet Take 1 tablet (5 mg total) by mouth daily. 10/01/21 12/31/21 Yes Charlott Rakes, MD  aspirin EC 81 MG tablet Take 81 mg by mouth every evening.   Yes [provider]  atorvastatin (LIPITOR) 80 MG tablet Take 1 tablet (80 mg total) by mouth daily. 10/01/21  Yes Charlott Rakes, MD  carvedilol (COREG) 12.5 MG tablet Take 1 tablet (12.5 mg total) by mouth 2 (two) times daily with a meal. 10/01/21  Yes Charlott Rakes, MD  dextromethorphan (DELSYM) 30 MG/5ML liquid Take 30 mg by mouth daily as needed for cough.   Yes [provider]  fluticasone (FLONASE) 50 MCG/ACT nasal spray Place 2 sprays into both nostrils daily. 03/15/21  Yes Collene Gobble, MD  furosemide (LASIX) 40 MG tablet Take 2 tablets (80 mg total) by mouth 2 (two) times daily. Patient taking differently: Take 40 mg by mouth 2 (two) times daily. 09/05/21 10/14/21 Yes [name], MD  gabapentin (NEURONTIN) 100 MG capsule Take 100 mg by mouth 3 (three) times daily. 09/05/21  Yes [provider]   glipiZIDE (GLUCOTROL) 5 MG tablet Take 0.5 tablets (2.5 mg total) by mouth 2 (two) times daily before a meal. 10/01/21  Yes Charlott Rakes, MD  Homeopathic Products (EARACHE DROPS OT) Place 1 drop into both ears daily.   Yes [provider]  isosorbide mononitrate (IMDUR) 60 MG 24 hr tablet TAKE 1 TABLET (60 MG TOTAL) BY MOUTH DAILY. 10/01/21 10/01/22 Yes Charlott Rakes, MD  loratadine (CLARITIN) 10 MG tablet Take 1 tablet (10 mg total) by mouth daily. 03/15/21  Yes Collene Gobble, MD  Polyethyl Glycol-Propyl Glycol (SYSTANE OP) Place 1 drop into both eyes daily.   Yes [provider]  calcitRIOL (ROCALTROL) 0.25 MCG capsule Take 0.25 mcg by mouth daily. Patient not taking: Reported on 10/14/2021    [provider]  Darbepoetin Alfa (ARANESP) 150 MCG/0.3ML SOSY injection Inject 0.3 mLs (150 mcg total) into the skin every Monday at 6 PM. Patient not taking: Reported on 10/14/2021 09/09/21   [name], MD  glucose blood (ACCU-CHEK GUIDE) test strip Use as instructed 3 times daily 09/26/21   Charlott Rakes, MD  guaiFENesin-dextromethorphan (ROBITUSSIN DM) 100-10 MG/5ML syrup Take 10 mLs by mouth every 4 (four) hours as needed for cough. Patient not taking: Reported on 10/14/2021 09/05/21   [name], MD  [employer].  Diagnosis - unstable gait 03/05/21   Charlott Rakes, MD  sodium chloride (OCEAN) 0.65 % SOLN nasal spray Place 2 sprays into both nostrils as needed for congestion. Patient not taking: No sig reported 03/21/19 02/21/21  Martyn Ehrich, NP    Current Facility-Administered Medications  Medication Dose Route Frequency Provider Last Rate Last Admin   acetaminophen (TYLENOL) tablet 650 mg  650 mg Oral Q6H PRN [place] Eric J, DO       Or   acetaminophen (TYLENOL) suppository 650 mg  650 mg Rectal Q6H PRN [place] Eric J, DO       albuterol (PROVENTIL) (2.5 MG/3ML) 0.083% nebulizer solution 2.5 mg  2.5 mg Nebulization Q4H PRN [place] Eric J, DO        amLODipine (NORVASC) tablet 5 mg  5 mg Oral Daily [place] Donnamarie Poag, DO   5 mg at 10/14/21 2006   atorvastatin (LIPITOR) tablet 80 mg  80 mg Oral Daily [place] Eric J, DO       carvedilol (COREG) tablet 12.5 mg  12.5 mg Oral BID WC [place] Eric J, DO       furosemide (LASIX) injection 80 mg  80 mg Intravenous Q8H [place] Donnamarie Poag, DO   80 mg at 10/15/21 4536   gabapentin (NEURONTIN) capsule 100 mg  100 mg Oral TID [place] Eric J, DO   100 mg at 10/14/21 2306   hydrALAZINE (APRESOLINE) tablet 25 mg  25 mg Oral Q8H PRN [place] Eric J, DO       insulin aspart (novoLOG) injection 0-5 Units  0-5 Units Subcutaneous QHS [place]  Donnamarie Poag, DO   3 Units at 10/14/21 2315   insulin aspart (novoLOG) injection 0-6 Units  0-6 Units Subcutaneous TID WC [place] Eric J, DO       isosorbide mononitrate (IMDUR) 24 hr tablet 60 mg  60 mg Oral Daily [place] Eric J, DO       loratadine (CLARITIN) tablet 10 mg  10 mg Oral Daily [place] Eric J, DO       ondansetron [facility]) tablet 4 mg  4 mg Oral Q6H PRN [place] Eric J, DO       Or   ondansetron [facility]) injection 4 mg  4 mg Intravenous Q6H PRN [place] Eric J, DO       polyethylene glycol (MIRALAX / GLYCOLAX) packet 17 g  17 g Oral Daily PRN [place] Eric J, DO       polyvinyl alcohol (LIQUIFILM TEARS) 1.4 % ophthalmic solution 1 drop  1 drop Both Eyes PRN [place] Donnamarie Poag, DO       Current Outpatient Medications  Medication Sig Dispense Refill   acetaminophen (TYLENOL) 500 MG tablet Take 500 mg by mouth every 6 (six) hours as needed for fever, moderate pain or mild pain.     albuterol (PROAIR HFA) 108 (90 Base) MCG/ACT inhaler Inhale 1-2 puffs into the lungs every 6 (six) hours as needed for wheezing or shortness of breath. 8.5 g 2   amLODipine (NORVASC) 5 MG tablet Take 1 tablet (5 mg total) by mouth daily. 90 tablet 1   aspirin EC 81 MG tablet Take 81 mg by mouth every evening.     atorvastatin (LIPITOR) 80 MG tablet Take 1 tablet (80 mg total) by mouth  daily. 90 tablet 1   carvedilol (COREG) 12.5 MG tablet Take 1 tablet (12.5 mg total) by mouth 2 (two) times daily with a meal. 180 tablet 1   dextromethorphan (DELSYM) 30 MG/5ML liquid Take 30 mg by mouth daily as needed for cough.     fluticasone (FLONASE) 50 MCG/ACT nasal spray Place 2 sprays into both nostrils daily. 16 g 2   furosemide (LASIX) 40 MG tablet Take 2 tablets (80 mg total) by mouth 2 (two) times daily. (Patient taking differently: Take 40 mg by mouth 2 (two) times daily.) 120 tablet 0   gabapentin (NEURONTIN) 100 MG capsule Take 100 mg by mouth 3 (three) times daily.     glipiZIDE (GLUCOTROL) 5 MG tablet Take 0.5 tablets (2.5 mg total) by mouth 2 (two) times daily before a meal. 90 tablet 1   Homeopathic Products (EARACHE DROPS OT) Place 1 drop into both ears daily.     isosorbide mononitrate (IMDUR) 60 MG 24 hr tablet TAKE 1 TABLET (60 MG TOTAL) BY MOUTH DAILY. 90 tablet 1   loratadine (CLARITIN) 10 MG tablet Take 1 tablet (10 mg total) by mouth daily. 30 tablet 11   Polyethyl Glycol-Propyl Glycol (SYSTANE OP) Place 1 drop into both eyes daily.     calcitRIOL (ROCALTROL) 0.25 MCG capsule Take 0.25 mcg by mouth daily. (Patient not taking: Reported on 10/14/2021)     Darbepoetin Alfa (ARANESP) 150 MCG/0.3ML SOSY injection Inject 0.3 mLs (150 mcg total) into the skin every Monday at 6 PM. (Patient not taking: Reported on 10/14/2021) 1.68 mL    glucose blood (ACCU-CHEK GUIDE) test strip Use as instructed 3 times daily 100 each 12   guaiFENesin-dextromethorphan (ROBITUSSIN DM) 100-10 MG/5ML syrup Take 10 mLs by mouth every 4 (four) hours as needed for cough. (Patient not taking: Reported  on 10/14/2021) 118 mL 0   [employer].  Diagnosis - unstable gait 1 each 0    Allergies as of 10/14/2021 - Review Complete 10/14/2021  Allergen Reaction Noted   Dust mite extract Cough 09/04/2014   Ace inhibitors Cough    Lovastatin Other (See Comments) 12/27/2014    Family History   Problem Relation Age of Onset   Hypertension Mother    Asthma Sister    Colon cancer Neg Hx    Allergic rhinitis Neg Hx    Eczema Neg Hx     Social History   Socioeconomic History   Marital status: Divorced    Spouse name: Not on file   Number of children: 5   Years of education: Not on file   Highest education level: Master's degree (e.g., MA, MS, MEng, MEd, MSW, MBA)  Occupational History   Occupation: Unemployed  Tobacco Use   Smoking status: Never   Smokeless tobacco: Never  Vaping Use   Vaping Use: Never used  Substance and Sexual Activity   Alcohol use: No   Drug use: No   Sexual activity: Not Currently    Birth control/protection: None  Other Topics Concern   Not on file  Social History Narrative   Lives alone.  5 children with two deceased.     Social Determinants of Health   Financial Resource Strain: Not on file  Food Insecurity: Not on file  Transportation Needs: Not on file  Physical Activity: Not on file  Stress: Not on file  Social Connections: Not on file  Intimate Partner Violence: Not on file    Review of Systems: Per HPI  Physical Exam: Vital signs in last 24 hours: Temp:  [97.4 F (36.3 C)-98.5 F (36.9 C)] 97.4 F (36.3 C) (03/06 1419) Pulse Rate:  [61-76] 69 (03/07 0755) Resp:  [9-22] 19 (03/07 0755) BP: (128-170)/(56-93) 146/62 (03/07 0755) SpO2:  [96 %-100 %] 97 % (03/07 0755) Weight:  [66 kg] 66 kg (03/06 1248)   General:   Elderly lady nondistressed Head:  Normocephalic and atraumatic. Eyes:  Sclera clear, no icterus.   Conjunctiva pink. Ears:  Normal auditory acuity. Nose:  No deformity, discharge,  or lesions. Mouth:  No deformity or lesions, dentition normal. Neck:  Supple; no masses or thyromegaly.  JVP elevated Lungs: Diminished breath sounds throughout lung fields Heart:  Regular rate and rhythm; no murmurs, clicks, rubs,  or gallops. Abdomen:  Soft, nontender and nondistended. No masses, hepatosplenomegaly or  hernias noted. Normal bowel sounds, without guarding, and without rebound.   Msk:  Symmetrical without gross deformities. Normal posture. Pulses:  No carotid, renal, femoral bruits. DP and PT symmetrical and equal Extremities:  Without clubbing or edema. Neurologic:  Alert and  oriented x4;  grossly normal neurologically. Skin:  Intact without significant lesions or rashes.    Intake/Output from previous day: No intake/output data recorded. Intake/Output this shift: No intake/output data recorded.  Lab Results: Recent Labs    10/14/21 1256  WBC 5.0  HGB 10.3*  HCT 30.0*  PLT 169   BMET Recent Labs    10/14/21 1256 10/15/21 0429  NA 132* 133*  K 3.1* 3.4*  CL 95* 97*  CO2 22 20*  GLUCOSE 201* 174*  BUN 106* 112*  CREATININE 6.99* 6.93*  CALCIUM 6.7* 6.9*  PHOS  --  6.7*   LFT Recent Labs    10/15/21 0429  ALBUMIN 3.3*   PT/INR Recent Labs    10/15/21 0429  LABPROT 15.8*  INR 1.3*   Hepatitis Panel No results for input(s): HEPBSAG, HCVAB, HEPAIGM, HEPBIGM in the last 72 hours.  Studies/Results: DG Chest Port 1 View  Result Date: 10/14/2021 CLINICAL DATA:  Shortness of breath. EXAM: PORTABLE CHEST 1 VIEW COMPARISON:  Chest x-ray 01/30/2022. FINDINGS: The heart size and mediastinal contours are within normal limits. Both lungs are clear. The visualized skeletal structures are unremarkable. IMPRESSION: No active disease. Electronically Signed   By: Ronney Asters M.D.   On: 10/14/2021 15:56    Assessment/Plan: CKD stage V.  Patient has been resistant to having vascular access placed.  Consult has been made to vascular surgery to place AV fistula  or AV graft. No urgent dialysis indications ANEMIA-not an issue at this point. 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  Troponins and 2D echo would be recommended ACCESS- As per [facility]    LOS: Bernice @TODAY @8 :09  AM

## 2021-10-15 NOTE — Op Note (Signed)
? ? ?  NAME: Paula [name]    MRN: [id number] ?DOB: 1945-12-18    DATE OF OPERATION: 10/15/2021 ? ?PREOP DIAGNOSIS:   ? ?Stage V chronic kidney disease ? ?POSTOP DIAGNOSIS:   ? ?Same ? ?PROCEDURE:  ?  ?New right upper arm AV graft (4-7 mm PTFE graft) ? ?SURGEON: Judeth Cornfield. Scot Dock, MD ? ?ASSIST: Aldona Bar ?PA ? ?ANESTHESIA: Axillary block with local ? ?EBL: Minimal ? ?INDICATIONS:  ? ? [name] is a 76 y.o. female with stage V chronic kidney disease.  We were asked to place new access. ? ?FINDINGS:  ? ?I did not see any usable vein for a fistula.  Her antecubital veins were quite small and even on the high brachial vein was small.  I placed an upper arm graft. ? ?TECHNIQUE:  ? ?The patient was taken to the operating room after an axillary block was placed.  The right arm was prepped and draped in the usual sterile fashion.  After the skin was infiltrated with 1% lidocaine longitudinal incision was made above the antecubital level and here the artery was dissected free.  Upon interrogation the patient had a high bifurcation of the brachial artery and the ulnar artery appeared to be the larger branch.  This was about a 3 mm artery.  The adjacent veins were small.  Separate longitudinal incision was made beneath the axilla after the skin was anesthetized and the high brachial veins were dissected free.  The larger vein was very fragile and had multiple valves and therefore I selected the adjacent vein.  This was small but appeared to be adequate for outflow.  A 4-7 mm PTFE graft was tunneled between the 2 incisions and the patient was heparinized. ? ?Attention was first turned to the arterial anastomosis.  The ulnar artery was clamped proximally and distally and a longitudinal arteriotomy was made.  Only a short segment of the 4 mm of the graft was excised, the graft slightly spatulated and sewn end-to-side to the artery using continuous 6-0 Prolene suture.  The graft then pulled the appropriate length  for anastomosis to the high brachial vein.  The vein was ligated distally and spatulated proximally.  The graft was cut to the appropriate length and sewn into into the vein using 2 continuous 6-0 Prolene sutures.  At the completion there was a palpable thrill in the graft and radial and ulnar signal with the Doppler. ? ?Hemostasis was obtained in the wounds.  The wounds were then closed with a deep layer of 3-0 Vicryl and the skin closed with 4-0 Monocryl.  Dermabond was applied.  The patient tolerated the procedure well was transferred to recovery room in stable condition.  All needle and sponge counts were correct. ? ?Given the complexity of the case a first assistant was necessary in order to expedient the procedure and safely perform the technical aspects of the operation. ? ?Deitra Mayo, MD, FACS ?[facility] ? ?DATE OF DICTATION:   10/15/2021 ? ?

## 2021-10-15 NOTE — ED Notes (Signed)
Daughter ambulating pt to the bathroom. ?

## 2021-10-16 ENCOUNTER — Inpatient Hospital Stay (HOSPITAL_COMMUNITY): Payer: Medicare Other

## 2021-10-16 ENCOUNTER — Encounter (HOSPITAL_COMMUNITY): Payer: Self-pay | Admitting: Vascular Surgery

## 2021-10-16 DIAGNOSIS — I5032 Chronic diastolic (congestive) heart failure: Secondary | ICD-10-CM | POA: Diagnosis not present

## 2021-10-16 DIAGNOSIS — I10 Essential (primary) hypertension: Secondary | ICD-10-CM | POA: Diagnosis not present

## 2021-10-16 DIAGNOSIS — R0609 Other forms of dyspnea: Secondary | ICD-10-CM | POA: Diagnosis not present

## 2021-10-16 DIAGNOSIS — E785 Hyperlipidemia, unspecified: Secondary | ICD-10-CM | POA: Diagnosis not present

## 2021-10-16 DIAGNOSIS — N179 Acute kidney failure, unspecified: Secondary | ICD-10-CM | POA: Diagnosis not present

## 2021-10-16 LAB — RENAL FUNCTION PANEL
Albumin: 3.2 g/dL — ABNORMAL LOW (ref 3.5–5.0)
Anion gap: 16 — ABNORMAL HIGH (ref 5–15)
BUN: 118 mg/dL — ABNORMAL HIGH (ref 8–23)
CO2: 22 mmol/L (ref 22–32)
Calcium: 6.7 mg/dL — ABNORMAL LOW (ref 8.9–10.3)
Chloride: 98 mmol/L (ref 98–111)
Creatinine, Ser: 7.16 mg/dL — ABNORMAL HIGH (ref 0.44–1.00)
GFR, Estimated: 6 mL/min — ABNORMAL LOW (ref >60)
Glucose, Bld: 166 mg/dL — ABNORMAL HIGH (ref 70–99)
Phosphorus: 7.3 mg/dL — ABNORMAL HIGH (ref 2.5–4.6)
Potassium: 3.7 mmol/L (ref 3.5–5.1)
Sodium: 136 mmol/L (ref 135–145)

## 2021-10-16 LAB — GLUCOSE, CAPILLARY
Glucose-Capillary: 146 mg/dL — ABNORMAL HIGH (ref 70–99)
Glucose-Capillary: 201 mg/dL — ABNORMAL HIGH (ref 70–99)
Glucose-Capillary: 165 mg/dL — ABNORMAL HIGH (ref 70–99)
Glucose-Capillary: 199 mg/dL — ABNORMAL HIGH (ref 70–99)

## 2021-10-16 LAB — ECHOCARDIOGRAM COMPLETE
Area-P 1/2: 2.65 cm2
Height: 62 in
S' Lateral: 2.1 cm
Weight: 2331.58 oz

## 2021-10-16 MED ORDER — HYDROMORPHONE HCL 1 MG/ML IJ SOLN
1.0000 mg | Freq: Once | INTRAMUSCULAR | Status: DC
  Filled 2021-10-16: qty 1

## 2021-10-16 MED ORDER — TORSEMIDE 20 MG PO TABS
80.0000 mg | ORAL_TABLET | Freq: Two times a day (BID) | ORAL | Status: DC
  Administered 2021-10-16 – 2021-10-18 (×5): 80 mg via ORAL
  Filled 2021-10-16 (×5): qty 4

## 2021-10-16 NOTE — Progress Notes (Signed)
[facility] ?ROUNDING NOTE  ? ?Subjective:  ? ?Interval History: This is a 76 year old lady with past medical history hypertension hyperlipidemia diabetes mellitus congestive heart failure with diastolic dysfunction cerebral aneurysm and CKD stage V.  Baseline serum creatinine has been about 5 mg to 7 mg/dL.  She has had multiple attempts to refer the patient to vein and vascular surgery for vascular access placement but has found reasons not to go.  She is interested in proceeding with hemodialysis at this point.  She presented to the emergency room with shortness of breath and congestive heart failure.  She underwent AV graft placement 10/15/2021 appreciate assistance from Dr. Scot Dock. ? ?Blood pressure 136/62 pulse 69 temperature 98.1 O2 sats 100% ? ?Sodium 136 potassium 3.7 chloride 98 CO2 21 BUN 119 creatinine 7.1 glucose 166 calcium 6.7 phosphorus 7.3 albumin 3.2 ? ? ? ?Objective:  ?Vital signs in last 24 hours:  ?Temp:  [97.4 ?F (36.3 ?C)-98.7 ?F (37.1 ?C)] 98.3 ?F (36.8 ?C) (03/08 0345) ?Pulse Rate:  [56-80] 69 (03/08 0345) ?Resp:  [12-19] 15 (03/08 0345) ?BP: (96-181)/(55-86) 136/62 (03/08 0345) ?SpO2:  [93 %-100 %] 100 % (03/08 0345) ?FiO2 (%):  [21 %] 21 % (03/07 1323) ?Weight:  [66.1 kg-66.2 kg] 66.1 kg (03/08 0500) ? ?Weight change: 0.225 kg ?Filed Weights  ? 10/14/21 1248 10/15/21 1006 10/16/21 0500  ?Weight: 66 kg 66.2 kg 66.1 kg  ? ? ?Intake/Output: ?I/O last 3 completed shifts: ?In: 22 [P.O.:360; I.V.:450] ?Out: 5 [Blood:5] ?  ?Intake/Output this shift: ? No intake/output data recorded. ? ?CVS- RRR no murmurs rubs gallops ?RS- CTA clear to auscultation ?ABD- BS present soft non-distended ?EXT-no significant lower extremity edema ? ? ?Basic Metabolic Panel: ?Recent Labs  ?Lab 10/14/21 ?1256 10/15/21 ?0429 10/16/21 ?0335  ?NA 132* 133* 136  ?K 3.1* 3.4* 3.7  ?CL 95* 97* 98  ?CO2 22 20* 22  ?GLUCOSE 201* 174* 166*  ?BUN 106* 112* 118*  ?CREATININE 6.99* 6.93* 7.16*  ?CALCIUM 6.7* 6.9*  6.7*  ?MG  --  2.1  --   ?PHOS  --  6.7* 7.3*  ? ? ?Liver Function Tests: ?Recent Labs  ?Lab 10/15/21 ?0429 10/16/21 ?0335  ?ALBUMIN 3.3* 3.2*  ? ?No results for input(s): LIPASE, AMYLASE in the last 168 hours. ?No results for input(s): AMMONIA in the last 168 hours. ? ?CBC: ?Recent Labs  ?Lab 10/14/21 ?1256  ?WBC 5.0  ?HGB 10.3*  ?HCT 30.0*  ?MCV 82.9  ?PLT 169  ? ? ?Cardiac Enzymes: ?No results for input(s): CKTOTAL, CKMB, CKMBINDEX, TROPONINI in the last 168 hours. ? ?BNP: ?Invalid input(s): POCBNP ? ?CBG: ?Recent Labs  ?Lab 10/15/21 ?1008 10/15/21 ?1321 10/15/21 ?1627 10/15/21 ?2041 10/16/21 ?0727  ?GLUCAP 165* 176* 282* 196* 146*  ? ? ?Microbiology: ?Results for orders placed or performed during the hospital encounter of 10/14/21  ?Resp Panel by RT-PCR (Flu A&B, Covid) Nasopharyngeal Swab     Status: None  ? Collection Time: 10/14/21  3:35 PM  ? Specimen: Nasopharyngeal Swab; Nasopharyngeal(NP) swabs in vial transport medium  ?Result Value Ref Range Status  ? SARS Coronavirus 2 by RT PCR NEGATIVE NEGATIVE Final  ?  Comment: (NOTE) ?SARS-CoV-2 target nucleic acids are NOT DETECTED. ? ?The SARS-CoV-2 RNA is generally detectable in upper respiratory ?specimens during the acute phase of infection. The lowest ?concentration of SARS-CoV-2 viral copies this assay can detect is ?138 copies/mL. A negative result does not preclude SARS-Cov-2 ?infection and should not be used as the sole basis for treatment  or ?other patient management decisions. A negative result may occur with  ?improper specimen collection/handling, submission of specimen other ?than nasopharyngeal swab, presence of viral mutation(s) within the ?areas targeted by this assay, and inadequate number of viral ?copies(<138 copies/mL). A negative result must be combined with ?clinical observations, patient history, and epidemiological ?information. The expected result is Negative. ? ?Fact Sheet for Patients:   ?EntrepreneurPulse.com.au ? ?Fact Sheet for Healthcare Providers:  ?IncredibleEmployment.be ? ?This test is no t yet approved or cleared by the [place] FDA and  ?has been authorized for detection and/or diagnosis of SARS-CoV-2 by ?FDA under an Emergency Use Authorization (EUA). This EUA will remain  ?in effect (meaning this test can be used) for the duration of the ?COVID-19 declaration under Section 564(b)(1) of the Act, 21 ?U.S.C.section 360bbb-3(b)(1), unless the authorization is terminated  ?or revoked sooner.  ? ? ?  ? Influenza A by PCR NEGATIVE NEGATIVE Final  ? Influenza B by PCR NEGATIVE NEGATIVE Final  ?  Comment: (NOTE) ?The Xpert Xpress SARS-CoV-2/FLU/RSV plus assay is intended as an aid ?in the diagnosis of influenza from Nasopharyngeal swab specimens and ?should not be used as a sole basis for treatment. Nasal washings and ?aspirates are unacceptable for Xpert Xpress SARS-CoV-2/FLU/RSV ?testing. ? ?Fact Sheet for Patients: ?EntrepreneurPulse.com.au ? ?Fact Sheet for Healthcare Providers: ?IncredibleEmployment.be ? ?This test is not yet approved or cleared by the [place] FDA and ?has been authorized for detection and/or diagnosis of SARS-CoV-2 by ?FDA under an Emergency Use Authorization (EUA). This EUA will remain ?in effect (meaning this test can be used) for the duration of the ?COVID-19 declaration under Section 564(b)(1) of the Act, 21 U.S.C. ?section 360bbb-3(b)(1), unless the authorization is terminated or ?revoked. ? ?Performed at [facility], [place] [address], [place], [place] ?[address] ?  ? ? ?Coagulation Studies: ?Recent Labs  ?  10/15/21 ?0429  ?LABPROT 15.8*  ?INR 1.3*  ? ? ?Urinalysis: ?Recent Labs  ?  10/14/21 ?1249  ?COLORURINE STRAW*  ?LABSPEC 1.009  ?PHURINE 6.0  ?GLUCOSEU 50*  ?HGBUR SMALL*  ?BILIRUBINUR NEGATIVE  ?KETONESUR NEGATIVE  ?PROTEINUR 100*  ?NITRITE NEGATIVE  ?LEUKOCYTESUR NEGATIVE  ?   ? ? ?Imaging: ?DG Chest Port 1 View ? ?Result Date: 10/14/2021 ?CLINICAL DATA:  Shortness of breath. EXAM: PORTABLE CHEST 1 VIEW COMPARISON:  Chest x-ray 01/30/2022. FINDINGS: The heart size and mediastinal contours are within normal limits. Both lungs are clear. The visualized skeletal structures are unremarkable. IMPRESSION: No active disease. Electronically Signed   By: Ronney Asters M.D.   On: 10/14/2021 15:56   ? ? ?Medications:  ? ? ? amLODipine  5 mg Oral Daily  ? atorvastatin  80 mg Oral Daily  ? carvedilol  12.5 mg Oral BID WC  ? furosemide  80 mg Intravenous Q8H  ? gabapentin  100 mg Oral TID  ? insulin aspart  0-5 Units Subcutaneous QHS  ? insulin aspart  0-6 Units Subcutaneous TID WC  ? isosorbide mononitrate  60 mg Oral Daily  ? loratadine  10 mg Oral Daily  ? ?acetaminophen **OR** acetaminophen, albuterol, hydrALAZINE, ondansetron **OR** ondansetron (ZOFRAN) IV, oxyCODONE-acetaminophen, polyethylene glycol, polyvinyl alcohol ? ?Assessment/ Plan:  ?CKD stage V.  Patient has been resistant to having vascular access placed.  AV graft placed 10/15/2021.  No urgent indications for dialysis would like to wait until graft is incorporated before initiating dialysis treatments. ?ANEMIA-not an issue at this point. 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  Would like to try torsemide 80 mg twice daily.  2D echo ordered ?ACCESS- As per VVS placement of AV graft 10/15/2021 ?  ? ? ? LOS: 2 ?Sherril Croon ?@TODAY @7 :14 AM ?  ?

## 2021-10-16 NOTE — Progress Notes (Signed)
Echocardiogram ?2D Echocardiogram has been performed. ? ?Oneal Deputy Laverna Dossett RDCS ?10/16/2021, 9:26 AM ?

## 2021-10-16 NOTE — Evaluation (Signed)
Occupational Therapy Evaluation ?Patient Details ?Name: Paula [name] ?MRN: [id number] ?DOB: June 24, 1946 ?Today's Date: 10/16/2021 ? ? ?History of Present Illness 76 y/o female presented to ED on 10/14/21 for SOB, dizziness/lightheadedness, malaise, and loss of appetite. 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 Admitted to initiate HD. Underwent R UE AV graft placement on 3/7. PMH: HTN, DM, CHF, cerebral aneurysm, CKD stage 5.  ? ?Clinical Impression ?  ?PTA, pt lives with daughter, typically Modified Independent with ADLs/mobility with occasional Rollator use. Pt reports increasing difficulty with IADLs at home, specifically kitchen mgmt. Pt presents now with minor deficits in endurance, dynamic standing balance and R UE pain (expected post op). Pt overall Setup-Supervision for UB/LB ADLs and min guard for short mobility without AD. No overt LOB or safety concerns noted. Initiated energy conservation strategy education with plans to further address in next sessions. Encouraged gentle ROM of R UE to maximize UE function post op. Pt may benefit from [facility] follow-up at DC to address IADLs in home setting. Will continue to follow acutely.   ?   ? ?Recommendations for follow up therapy are one component of a multi-disciplinary discharge planning process, led by the attending physician.  Recommendations may be updated based on patient status, additional functional criteria and insurance authorization.  ? ?Follow Up Recommendations ? Home health OT  ?  ?Assistance Recommended at Discharge Set up Supervision/Assistance  ?Patient can return home with the following Assistance with cooking/housework;Assist for transportation;Help with stairs or ramp for entrance ? ?  ?Functional Status Assessment ? Patient has had a recent decline in their functional status and demonstrates the ability to make significant improvements in function in a reasonable and predictable amount of time.  ?Equipment Recommendations ?  None recommended by OT  ?  ?Recommendations for Other Services   ? ? ?  ?Precautions / Restrictions Precautions ?Precautions: Fall ?Restrictions ?Weight Bearing Restrictions: No  ? ?  ? ?Mobility Bed Mobility ?Overal bed mobility: Modified Independent ?Bed Mobility: Supine to Sit ?  ?  ?  ?  ?  ?  ?  ? ?Transfers ?Overall transfer level: Needs assistance ?Equipment used: None ?Transfers: Sit to/from Stand ?Sit to Stand: Supervision ?  ?  ?  ?  ?  ?  ?  ? ?  ?Balance Overall balance assessment: Needs assistance ?Sitting-balance support: Feet supported ?Sitting balance-Leahy Scale: Good ?  ?  ?Standing balance support: No upper extremity supported, Bilateral upper extremity supported ?Standing balance-Leahy Scale: Good ?  ?  ?  ?  ?  ?  ?  ?  ?  ?  ?  ?  ?   ? ?ADL either performed or assessed with clinical judgement  ? ?ADL Overall ADL's : Needs assistance/impaired ?Eating/Feeding: Independent;Sitting ?  ?Grooming: Set up;Standing;Sitting;Wash/dry face ?  ?Upper Body Bathing: Set up;Sitting ?  ?Lower Body Bathing: Set up;Sit to/from stand ?  ?Upper Body Dressing : Set up;Sitting ?  ?Lower Body Dressing: Set up;Sit to/from stand ?Lower Body Dressing Details (indicate cue type and reason): able to cross LE to don B socks ?Toilet Transfer: Min guard;Ambulation ?Toilet Transfer Details (indicate cue type and reason): no AD, walked around foot of bed to/from [facility] ?Toileting- Clothing Manipulation and Hygiene: Set up;Sit to/from stand ?Toileting - Clothing Manipulation Details (indicate cue type and reason): able to perform peri care in standing/seated without assist ?  ?  ?Functional mobility during ADLs: Min guard ?General ADL Comments: expected R UE soreness post op,  does ADLs without assist - does have concerns about IADL completion, reports filling out an application to receive assistance for these tasks at home  ? ? ? ?Vision Baseline Vision/History: 1 Wears glasses ?Ability to See in Adequate Light: 0  Adequate ?Patient Visual Report: No change from baseline ?Vision Assessment?: No apparent visual deficits  ?   ?Perception   ?  ?Praxis   ?  ? ?Pertinent Vitals/Pain Pain Assessment ?Pain Assessment: Faces ?Faces Pain Scale: Hurts little more ?Pain Location: R UE where graft placed ?Pain Descriptors / Indicators: Grimacing, Guarding, Sore ?Pain Intervention(s): Monitored during session, Other (comment) (encouraged gentle ROM)  ? ? ? ?Hand Dominance Right ?  ?Extremity/Trunk Assessment Upper Extremity Assessment ?Upper Extremity Assessment: RUE deficits/detail ?RUE Deficits / Details: some difficulty lifting UE d/t pain with graft placement. encouraged gentle ROM within tolerance ?  ?Lower Extremity Assessment ?Lower Extremity Assessment: Defer to PT evaluation ?  ?Cervical / Trunk Assessment ?Cervical / Trunk Assessment: Normal ?  ?Communication Communication ?Communication: No difficulties ?  ?Cognition Arousal/Alertness: Awake/alert ?Behavior During Therapy: [facility] for tasks assessed/performed ?Overall Cognitive Status: Within Functional Limits for tasks assessed ?  ?  ?  ?  ?  ?  ?  ?  ?  ?  ?  ?  ?  ?  ?  ?  ?  ?  ?  ?General Comments    ? ?  ?Exercises   ?  ?Shoulder Instructions    ? ? ?Home Living Family/patient expects to be discharged to:: Private residence ?Living Arrangements: Children ?Available Help at Discharge: Family;Available PRN/intermittently ?Type of Home: Apartment ?Home Access: Stairs to enter ?Entrance Stairs-Number of Steps: 2 full flights ?Entrance Stairs-Rails: Right ?Home Layout: One level ?  ?  ?Bathroom Shower/Tub: Tub/shower unit ?  ?Bathroom Toilet: Standard ?Bathroom Accessibility: Yes ?  ?Home Equipment: Shower seat;Toilet riser;Rollator (4 wheels) ?  ?  ?  ? ?  ?Prior Functioning/Environment Prior Level of Function : Needs assist ?  ?  ?  ?  ?  ?  ?Mobility Comments: Pt with difficulty navigating stairs therefore somewhat confined to home and is to start dialysis soon. "sometimes" uses  Rollator for mobility ?ADLs Comments: Mod I for ADLs but daughter can assist prn on "bad days". Struggles with IADLS more due to fatigue.   Meal prep is challenging per pt. ?  ? ?  ?  ?OT Problem List: Decreased strength;Decreased activity tolerance;Impaired balance (sitting and/or standing);Pain ?  ?   ?OT Treatment/Interventions: Self-care/ADL training;Energy conservation;Therapeutic activities;Therapeutic exercise;Patient/family education  ?  ?OT Goals(Current goals can be found in the care plan section) Acute Rehab OT Goals ?Patient Stated Goal: have meal assistance ?OT Goal Formulation: With patient ?Time For Goal Achievement: 10/30/21 ?Potential to Achieve Goals: Good  ?OT Frequency: Min 2X/week ?  ? ?Co-evaluation   ?  ?  ?  ?  ? ?  ?AM-PAC OT "6 Clicks" Daily Activity     ?Outcome Measure Help from another person eating meals?: None ?Help from another person taking care of personal grooming?: A Little ?Help from another person toileting, which includes using toliet, bedpan, or urinal?: A Little ?Help from another person bathing (including washing, rinsing, drying)?: A Little ?Help from another person to put on and taking off regular upper body clothing?: A Little ?Help from another person to put on and taking off regular lower body clothing?: A Little ?6 Click Score: 19 ?  ?End of Session Nurse Communication: Mobility status ? ?Activity Tolerance: Patient  tolerated treatment well ?Patient left: in chair;with call bell/phone within reach;with nursing/sitter in room ? ?OT Visit Diagnosis: Other abnormalities of gait and mobility (R26.89);Muscle weakness (generalized) (M62.81)  ?              ?Time: 1142-1200 ?OT Time Calculation (min): 18 min ?Charges:  OT General Charges ?$OT Visit: 1 Visit ?OT Evaluation ?$OT Eval Moderate Complexity: 1 Mod ? ?[name] Free B, OTR/L ?Acute Rehab Services ?Office: [phone]  ? ?Layla Maw ?10/16/2021, 12:15 PM ?

## 2021-10-16 NOTE — Progress Notes (Addendum)
[facility] ? ?Subjective  - Doing well, right arm a little sore. ? ? ?Objective ?136/62 ?69 ?98.3 ?F (36.8 ?C) (Oral) ?15 ?100% ? ?Intake/Output Summary (Last 24 hours) at 10/16/2021 0852 ?Last data filed at 10/15/2021 1946 ?Gross per 24 hour  ?Intake 810 ml  ?Output 5 ml  ?Net 805 ml  ? ? ?Right UE palpable thrill in graft ?Palpable radial pulses, grip 5/5, and sensation intact and equal B UE ?Incision healing well  ?Lungs non labored breathing ? ?Assessment/Planning: ?CKD stage V not requiring HD at this time. ?POD # 1 right AV graft placement ? ?Palpable thrill in graft without symptoms of steal  ?She will f/u in 2-3 weeks for exam ?Do not stick graft for 4 weeks ? ?Roxy Horseman ?10/16/2021 ?8:52 AM ?-- ? ?Laboratory ?Lab Results: ?Recent Labs  ?  10/14/21 ?1256  ?WBC 5.0  ?HGB 10.3*  ?HCT 30.0*  ?PLT 169  ? ?BMET ?Recent Labs  ?  10/15/21 ?0429 10/16/21 ?0335  ?NA 133* 136  ?K 3.4* 3.7  ?CL 97* 98  ?CO2 20* 22  ?GLUCOSE 174* 166*  ?BUN 112* 118*  ?CREATININE 6.93* 7.16*  ?CALCIUM 6.9* 6.7*  ? ? ?COAG ?Lab Results  ?Component Value Date  ? INR 1.3 (H) 10/15/2021  ? INR 1.1 12/29/2020  ? INR 1.1 03/26/2019  ? ?No results found for: PTT ? ?I have interviewed the patient and examined the patient. I agree with the findings by the PA.  Vascular surgery will be available as needed. ? ?Gae Gallop, MD ? ? ?

## 2021-10-16 NOTE — Progress Notes (Signed)
PT Cancellation Note ? ?Patient Details ?Name: Paula [name] ?MRN: [id number] ?DOB: Oct 31, 1945 ? ? ?Cancelled Treatment:    Reason Eval/Treat Not Completed: Patient declined, no reason specified Attempted x 2. Patient refusing due to R UE pain both attempts and requested to be seen next date. Encouraged OOB mobility but patient continued to decline. PT will re-attempt at later date.  ? ?Scorpio Fortin A. Gilford Rile, PT, DPT ?Acute Rehabilitation Services ?Pager [phone] ?Office [phone] ? ? ? ?Paula [name] ?10/16/2021, 2:45 PM ?

## 2021-10-16 NOTE — Progress Notes (Signed)
PROGRESS NOTE    GLADY OUDERKIRK  VOZ:[id number] DOB: Jan 07, 1946 DOA: 10/14/2021 PCP: Charlott Rakes, MD    Brief Narrative:  Paula [name] is a 76 year old female with past medical history significant for HTN, HLD, DM 2, chronic diastolic congestive heart failure, cerebral aneurysm, CKD stage V who presented to [facility] ED on 3/6 via EMS with complaint of shortness of breath, dizziness, malaise, loss of appetite.  Patient reports onset for several months, has been progressing significantly over the last several days.  Patient reports symptoms of shortness of breath worse at night and affecting her ability to sleep.  Patient has been followed by nephrology outpatient, not yet on dialysis but admits still making urine.  In the ED, temperature 97.4 F, HR 61, RR 14, BP 154/70, SPO2 98% on room air.  WBC 5.0, hemoglobin 10.0, platelets 169.  Sodium 132, potassium 3.1, chloride 95, CO2 22, glucose 201, BUN 106, creatinine 6.99.  BNP 54.4.  COVID-19 PCR negative.  Influenza A/B PCR negative.  Urinalysis with 100 protein, otherwise unrevealing.  Chest x-ray with no acute cardiopulmonary disease process.  ED physician discussed case with nephrology, Dr. [name] who recommended medicine admission and will likely need to initiate HD.  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     Assessment & Plan:   Assessment and Plan: * Acute renal failure superimposed on stage 5 chronic kidney disease, not on chronic dialysis [facility]) Patient presenting to the ED with progressive shortness of breath malaise.  Creatinine on admission 6.99 which has been steadily rising over the last several months.  BUN elevated 106, potassium 3.1.  Chest x-ray with no acute cardiopulmonary disease process, BNP within normal limits.  Nephrology has been recommending placement of HD catheter to start dialysis for some time.  Recent renal ultrasound 09/01/2021 with  increased renal cortical echogenicity consistent with chronic medical renal disease.  Underwent right upper extremity AV graft placement by vascular surgery, Dr. Scot Dock on 10/15/2021.  --Cr 6.99>6.93>7.16 --Torsemide 80 mg p.o. twice daily --Strict I's and O's and daily weights --Renal function daily  Hypokalemia Potassium 3.7 this morning.  Hyponatremia Sodium 132 on admission, in the setting of hyperglycemia with glucose 261.  Sodium corrected for hyperglycemia = 135.  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  Urine osmolality 262, urine sodium 21. --Renal function panel daily  Type 2 diabetes mellitus without complication, without long-term current use of insulin ([place]) Patient on glipizide 2.5 mg p.o. twice daily at baseline.  Hemoglobin A1c 7.6. --Hold oral hypoglycemics while inpatient --SSI for coverage --CBGs qAC/HS  Essential hypertension --Continue home amlodipine 5 mg p.o. daily, carvedilol 12.5 mg p.o. twice daily, Imdur 60 mg p.o. daily. --IV Lasix as above --Hydralazine 25 mg p.o. q8h PRN SBP >180 or DBP >110  Hyperlipidemia --Atorvastatin 80 mg p.o. daily  Chronic diastolic CHF (congestive heart failure) ([place]) TTE 02/12/2021 with LVEF 60 to 42%, grade 1 diastolic dysfunction, severe LVH, left atrial enlargement, no aortic stenosis.  BMP within normal limits on admission.  Chest x-ray with no pulmonary edema. --Continue torsemide as above -- TTE: Pending --Strict I's and O's and daily weight  Physical deconditioning -- PT/OT consult: Pending     DVT prophylaxis: SCDs Start: 10/14/21 1919    Code Status: Full Code Family Communication: Family present at bedside this morning  Disposition Plan:  Level of care: Med-Surg Status is: Inpatient Remains  inpatient appropriate because: Pending AV graft/fistula per vascular surgery today, further per nephrology, on IV furosemide    Consultants:  Nephrology, Dr.  [name] Vascular surgery  Procedures:  Right upper extremity AV graft/fistula placement: Pending  Antimicrobials:  None   Subjective: Patient seen examined bedside, resting comfortably.  Complaining of some pain to her AV graft site, but otherwise no significant questions, complaints or concerns this morning.  Seen by nephrology, transition IV furosemide to oral torsemide today.   Denies headache, no dizziness, no chest pain, no shortness of breath, no abdominal pain.  No acute events overnight per nurse staff.  Objective: Vitals:   10/15/21 2350 10/16/21 0345 10/16/21 0500 10/16/21 0931  BP: 135/60 136/62  (!) 144/63  Pulse: 80 69  75  Resp: 19 15  16   Temp: 98.7 F (37.1 C) 98.3 F (36.8 C)  98.5 F (36.9 C)  TempSrc: Oral Oral  Oral  SpO2: 99% 100%  100%  Weight:   66.1 kg   Height:        Intake/Output Summary (Last 24 hours) at 10/16/2021 1128 Last data filed at 10/15/2021 1946 Gross per 24 hour  Intake 810 ml  Output 5 ml  Net 805 ml   Filed Weights   10/14/21 1248 10/15/21 1006 10/16/21 0500  Weight: 66 kg 66.2 kg 66.1 kg    Examination:  Physical Exam: GEN: NAD, alert and oriented x 3, chronically ill in appearance HEENT: NCAT, PERRL, EOMI, sclera clear, MMM PULM: CTAB w/o wheezes/crackles, normal respiratory effort on room air CV: RRR w/o M/G/R GI: abd soft, NTND, NABS, no R/G/M MSK: no peripheral edema, muscle strength globally intact 5/5 bilateral upper/lower extremities, right upper extremity surgical incision noted NEURO: CN II-XII intact, no focal deficits, sensation to light touch intact PSYCH: normal mood/affect Integumentary: dry/intact, no rashes or wounds    Data Reviewed: I have personally reviewed following labs and imaging studies  CBC: Recent Labs  Lab 10/14/21 1256  WBC 5.0  HGB 10.3*  HCT 30.0*  MCV 82.9  PLT 923   Basic Metabolic Panel: Recent Labs  Lab 10/14/21 1256 10/15/21 0429 10/16/21 0335  NA 132* 133* 136  K 3.1*  3.4* 3.7  CL 95* 97* 98  CO2 22 20* 22  GLUCOSE 201* 174* 166*  BUN 106* 112* 118*  CREATININE 6.99* 6.93* 7.16*  CALCIUM 6.7* 6.9* 6.7*  MG  --  2.1  --   PHOS  --  6.7* 7.3*   GFR: Estimated Creatinine Clearance: 6.1 mL/min (A) (by C-G formula based on SCr of 7.16 mg/dL (H)). Liver Function Tests: Recent Labs  Lab 10/15/21 0429 10/16/21 0335  ALBUMIN 3.3* 3.2*   No results for input(s): LIPASE, AMYLASE in the last 168 hours. No results for input(s): AMMONIA in the last 168 hours. Coagulation Profile: Recent Labs  Lab 10/15/21 0429  INR 1.3*   Cardiac Enzymes: No results for input(s): CKTOTAL, CKMB, CKMBINDEX, TROPONINI in the last 168 hours. BNP (last 3 results) No results for input(s): PROBNP in the last 8760 hours. HbA1C: Recent Labs    10/15/21 0433  HGBA1C 7.6*   CBG: Recent Labs  Lab 10/15/21 1321 10/15/21 1627 10/15/21 2041 10/16/21 0727 10/16/21 1124  GLUCAP 176* 282* 196* 146* 201*   Lipid Profile: No results for input(s): CHOL, HDL, LDLCALC, TRIG, CHOLHDL, LDLDIRECT in the last 72 hours. Thyroid Function Tests: No results for input(s): TSH, T4TOTAL, FREET4, T3FREE, THYROIDAB in the last 72 hours. Anemia Panel: No results for input(s):  VITAMINB12, FOLATE, FERRITIN, TIBC, IRON, RETICCTPCT in the last 72 hours. Sepsis Labs: No results for input(s): PROCALCITON, LATICACIDVEN in the last 168 hours.  Recent Results (from the past 240 hour(s))  Resp Panel by RT-PCR (Flu A&B, Covid) Nasopharyngeal Swab     Status: None   Collection Time: 10/14/21  3:35 PM   Specimen: Nasopharyngeal Swab; Nasopharyngeal(NP) swabs in vial transport medium  Result Value Ref Range Status   SARS Coronavirus 2 by RT PCR NEGATIVE NEGATIVE Final    Comment: (NOTE) SARS-CoV-2 target nucleic acids are NOT DETECTED.  The SARS-CoV-2 RNA is generally detectable in upper respiratory specimens during the acute phase of infection. The lowest concentration of SARS-CoV-2 viral  copies this assay can detect is 138 copies/mL. A negative result does not preclude SARS-Cov-2 infection and should not be used as the sole basis for treatment or other patient management decisions. A negative result may occur with  improper specimen collection/handling, submission of specimen other than nasopharyngeal swab, presence of viral mutation(s) within the areas targeted by this assay, and inadequate number of viral copies(<138 copies/mL). A negative result must be combined with clinical observations, patient history, and epidemiological information. The expected result is Negative.  Fact Sheet for Patients:  EntrepreneurPulse.com.au  Fact Sheet for Healthcare Providers:  IncredibleEmployment.be  This test is no t yet approved or cleared by the [place] FDA and  has been authorized for detection and/or diagnosis of SARS-CoV-2 by FDA under an Emergency Use Authorization (EUA). This EUA will remain  in effect (meaning this test can be used) for the duration of the COVID-19 declaration under Section 564(b)(1) of the Act, 21 U.S.C.section 360bbb-3(b)(1), unless the authorization is terminated  or revoked sooner.       Influenza A by PCR NEGATIVE NEGATIVE Final   Influenza B by PCR NEGATIVE NEGATIVE Final    Comment: (NOTE) The Xpert Xpress SARS-CoV-2/FLU/RSV plus assay is intended as an aid in the diagnosis of influenza from Nasopharyngeal swab specimens and should not be used as a sole basis for treatment. Nasal washings and aspirates are unacceptable for Xpert Xpress SARS-CoV-2/FLU/RSV testing.  Fact Sheet for Patients: EntrepreneurPulse.com.au  Fact Sheet for Healthcare Providers: IncredibleEmployment.be  This test is not yet approved or cleared by the [place] FDA and has been authorized for detection and/or diagnosis of SARS-CoV-2 by FDA under an Emergency Use Authorization (EUA). This  EUA will remain in effect (meaning this test can be used) for the duration of the COVID-19 declaration under Section 564(b)(1) of the Act, 21 U.S.C. section 360bbb-3(b)(1), unless the authorization is terminated or revoked.  Performed at [facility], [place] [address] [place], [place] [address]          Radiology Studies: DG Chest Port 1 View  Result Date: 10/14/2021 CLINICAL DATA:  Shortness of breath. EXAM: PORTABLE CHEST 1 VIEW COMPARISON:  Chest x-ray 01/30/2022. FINDINGS: The heart size and mediastinal contours are within normal limits. Both lungs are clear. The visualized skeletal structures are unremarkable. IMPRESSION: No active disease. Electronically Signed   By: Ronney Asters M.D.   On: 10/14/2021 15:56        Scheduled Meds:  amLODipine  5 mg Oral Daily   atorvastatin  80 mg Oral Daily   carvedilol  12.5 mg Oral BID WC   gabapentin  100 mg Oral TID   insulin aspart  0-5 Units Subcutaneous QHS   insulin aspart  0-6 Units Subcutaneous TID WC   isosorbide mononitrate  60 mg  Oral Daily   loratadine  10 mg Oral Daily   torsemide  80 mg Oral BID   Continuous Infusions:     LOS: 2 days    Time spent: 48 minutes spent on chart review, discussion with nursing staff, consultants, updating family and interview/physical exam; more than 50% of that time was spent in counseling and/or coordination of care.    Christipher Rieger J [place] DO [facility] Available via Epic secure chat 7am-7pm After these hours, please refer to coverage provider listed on [email or web address] 10/16/2021, 11:28 AM

## 2021-10-17 LAB — RENAL FUNCTION PANEL
Albumin: 3.2 g/dL — ABNORMAL LOW (ref 3.5–5.0)
Anion gap: 17 — ABNORMAL HIGH (ref 5–15)
BUN: 128 mg/dL — ABNORMAL HIGH (ref 8–23)
CO2: 22 mmol/L (ref 22–32)
Calcium: 7 mg/dL — ABNORMAL LOW (ref 8.9–10.3)
Chloride: 96 mmol/L — ABNORMAL LOW (ref 98–111)
Creatinine, Ser: 7.69 mg/dL — ABNORMAL HIGH (ref 0.44–1.00)
GFR, Estimated: 5 mL/min — ABNORMAL LOW (ref >60)
Glucose, Bld: 120 mg/dL — ABNORMAL HIGH (ref 70–99)
Phosphorus: 7.7 mg/dL — ABNORMAL HIGH (ref 2.5–4.6)
Potassium: 3.3 mmol/L — ABNORMAL LOW (ref 3.5–5.1)
Sodium: 135 mmol/L (ref 135–145)

## 2021-10-17 LAB — GLUCOSE, CAPILLARY
Glucose-Capillary: 115 mg/dL — ABNORMAL HIGH (ref 70–99)
Glucose-Capillary: 283 mg/dL — ABNORMAL HIGH (ref 70–99)
Glucose-Capillary: 167 mg/dL — ABNORMAL HIGH (ref 70–99)
Glucose-Capillary: 244 mg/dL — ABNORMAL HIGH (ref 70–99)

## 2021-10-17 MED ORDER — CALCIUM ACETATE (PHOS BINDER) 667 MG PO CAPS
667.0000 mg | ORAL_CAPSULE | Freq: Three times a day (TID) | ORAL | Status: DC
Start: 2021-10-17 — End: 2021-10-18
  Administered 2021-10-17 – 2021-10-18 (×3): 667 mg via ORAL
  Filled 2021-10-17 (×3): qty 1

## 2021-10-17 NOTE — Progress Notes (Signed)
PT Cancellation Note ? ?Patient Details ?Name: Paula [name] ?MRN: [id number] ?DOB: November 03, 1945 ? ? ?Cancelled Treatment:    Reason Eval/Treat Not Completed: Patient declined, no reason specified Patient refusing mobility due to "R arm hurting all the way to my heart". Encouraged OOB mobility to prevent weakness, patient stated "I will walk at discharge." PT will re-attempt one more time tomorrow and then will sign off due to lack of participation/refusals.  ? ?Paula [name], PT, DPT ?Acute Rehabilitation Services ?Pager [phone] ?Office [phone] ? ? ? ?Paula [name] ?10/17/2021, 9:20 AM ?

## 2021-10-17 NOTE — Care Management Important Message (Signed)
Important Message ? ?Patient Details  ?Name: Paula [name] ?MRN: [id number] ?Date of Birth: 1946/07/06 ? ? ?Medicare Important Message Given:  Yes ? ? ? ? ?[name] ?10/17/2021, 1:25 PM ?

## 2021-10-17 NOTE — Progress Notes (Signed)
Paula [name]   Subjective:   Interval History: This is a 76 year old lady with past medical history hypertension hyperlipidemia diabetes mellitus congestive heart failure with diastolic dysfunction cerebral aneurysm and CKD stage V.  Baseline serum creatinine has been about 5 mg to 7 mg/dL.  She has had multiple attempts to refer the patient to vein and vascular surgery for vascular access placement but has found reasons not to go.  She is interested in proceeding with hemodialysis at this point.  She presented to the emergency room with shortness of breath and congestive heart failure.  She underwent AV graft placement 10/15/2021 appreciate assistance from Dr. Scot [name].  Blood pressure 161/61 pulse 61 temperature 97.7 O2 sats 100% room air  Sodium 135 potassium 3.3 chloride 96 CO2 20 BUN 128 creatinine 7.69 glucose 120 calcium 7 phosphorus 7.7    Objective:  Vital signs in last 24 hours:  Temp:  [97.7 F (36.5 C)-98.5 F (36.9 C)] 97.7 F (36.5 C) (03/09 0446) Pulse Rate:  [66-75] 71 (03/09 0446) Resp:  [16-18] 18 (03/09 0446) BP: (115-161)/(56-64) 161/61 (03/09 0446) SpO2:  [99 %-100 %] 100 % (03/09 0446)  Weight change:  Filed Weights   10/14/21 1248 10/15/21 1006 10/16/21 0500  Weight: 66 kg 66.2 kg 66.1 kg    Intake/Output: I/O last 3 completed shifts: In: 1140 [P.O.:1140] Out: -    Intake/Output this shift:  Total I/O In: 300 [P.O.:300] Out: -   CVS- RRR no murmurs rubs gallops RS- CTA clear to auscultation ABD- BS present soft non-distended EXT-no significant lower extremity edema   Basic Metabolic Panel: Recent Labs  Lab 10/14/21 1256 10/15/21 0429 10/16/21 0335 10/17/21 0551  NA 132* 133* 136 135  K 3.1* 3.4* 3.7 3.3*  CL 95* 97* 98 96*  CO2 22 20* 22 22  GLUCOSE 201* 174* 166* 120*  BUN 106* 112* 118* 128*  CREATININE 6.99* 6.93* 7.16* 7.69*  CALCIUM 6.7* 6.9* 6.7* 7.0*  MG  --  2.1  --   --   PHOS  --  6.7* 7.3* 7.7*      Liver Function Tests: Recent Labs  Lab 10/15/21 0429 10/16/21 0335 10/17/21 0551  ALBUMIN 3.3* 3.2* 3.2*    No results for input(s): LIPASE, AMYLASE in the last 168 hours. No results for input(s): AMMONIA in the last 168 hours.  CBC: Recent Labs  Lab 10/14/21 1256  WBC 5.0  HGB 10.3*  HCT 30.0*  MCV 82.9  PLT 169     Cardiac Enzymes: No results for input(s): CKTOTAL, CKMB, CKMBINDEX, TROPONINI in the last 168 hours.  BNP: Invalid input(s): POCBNP  CBG: Recent Labs  Lab 10/16/21 0727 10/16/21 1124 10/16/21 1644 10/16/21 2107 10/17/21 0738  GLUCAP 146* 201* 165* 199* 115*     Microbiology: Results for orders placed or performed during the hospital encounter of 10/14/21  Resp Panel by RT-PCR (Flu A&B, Covid) Nasopharyngeal Swab     Status: None   Collection Time: 10/14/21  3:35 PM   Specimen: Nasopharyngeal Swab; Nasopharyngeal(NP) swabs in vial transport medium  Result Value Ref Range Status   SARS Coronavirus 2 by RT PCR NEGATIVE NEGATIVE Final    Comment: ([name]) SARS-CoV-2 target nucleic acids are NOT DETECTED.  The SARS-CoV-2 RNA is generally detectable in upper respiratory specimens during the acute phase of infection. The lowest concentration of SARS-CoV-2 viral copies this assay can detect is 138 copies/mL. A negative result does not preclude SARS-Cov-2 infection and should not be used as  the sole basis for treatment or other patient management decisions. A negative result may occur with  improper specimen collection/handling, submission of specimen other than nasopharyngeal swab, presence of viral mutation(s) within the areas targeted by this assay, and inadequate number of viral copies(<138 copies/mL). A negative result must be combined with clinical observations, patient history, and epidemiological information. The expected result is Negative.  Fact Sheet for Patients:  EntrepreneurPulse.com.au  Fact Sheet for  Healthcare Providers:  IncredibleEmployment.be  This test is no t yet approved or cleared by the [place] FDA and  has been authorized for detection and/or diagnosis of SARS-CoV-2 by FDA under an Emergency Use Authorization (EUA). This EUA will remain  in effect (meaning this test can be used) for the duration of the COVID-19 declaration under Section 564(b)(1) of the Act, 21 U.S.C.section 360bbb-3(b)(1), unless the authorization is terminated  or revoked sooner.       Influenza A by PCR NEGATIVE NEGATIVE Final   Influenza B by PCR NEGATIVE NEGATIVE Final    Comment: ([name]) The Xpert Xpress SARS-CoV-2/FLU/RSV plus assay is intended as an aid in the diagnosis of influenza from Nasopharyngeal swab specimens and should not be used as a sole basis for treatment. Nasal washings and aspirates are unacceptable for Xpert Xpress SARS-CoV-2/FLU/RSV testing.  Fact Sheet for Patients: EntrepreneurPulse.com.au  Fact Sheet for Healthcare Providers: IncredibleEmployment.be  This test is not yet approved or cleared by the [place] FDA and has been authorized for detection and/or diagnosis of SARS-CoV-2 by FDA under an Emergency Use Authorization (EUA). This EUA will remain in effect (meaning this test can be used) for the duration of the COVID-19 declaration under Section 564(b)(1) of the Act, 21 U.S.C. section 360bbb-3(b)(1), unless the authorization is terminated or revoked.  Performed at [facility], [place] [address] [place], [place] [address]     Coagulation Studies: Recent Labs    10/15/21 0429  LABPROT 15.8*  INR 1.3*     Urinalysis: Recent Labs    10/14/21 1249  COLORURINE STRAW*  LABSPEC 1.009  PHURINE 6.0  GLUCOSEU 50*  HGBUR SMALL*  BILIRUBINUR NEGATIVE  KETONESUR NEGATIVE  PROTEINUR 100*  NITRITE NEGATIVE  LEUKOCYTESUR NEGATIVE       Imaging: ECHOCARDIOGRAM COMPLETE  Result Date:  10/16/2021    ECHOCARDIOGRAM REPORT   Patient Name:   Paula [name] Date of Exam: 10/16/2021 Medical Rec #:  [id number]           Height:       62.0 in Accession #:    [id number]          Weight:       145.7 lb Date of Birth:  18-Feb-1946           BSA:          1.671 m Patient Age:    66 years            BP:           136/62 mmHg Patient Gender: F                   HR:           76 bpm. Exam Location:  Inpatient Procedure: 2D Echo, Color Doppler and Cardiac Doppler Indications:    R06.9 DOE  History:        Patient has prior history of Echocardiogram examinations, most  recent 02/22/2021. CHF; Risk Factors:Hypertension, Diabetes and                 Dyslipidemia.  Sonographer:    Raquel Sarna Senior RDCS Referring Phys: [place]  1. Left ventricular ejection fraction, by estimation, is 70 to 75%. The left ventricle has hyperdynamic function. The left ventricle has no regional wall motion abnormalities. There is severe concentric left ventricular hypertrophy. Left ventricular diastolic parameters are consistent with Grade I diastolic dysfunction (impaired relaxation).  2. Right ventricular systolic function is normal. The right ventricular size is normal. There is normal pulmonary artery systolic pressure. The estimated right ventricular systolic pressure is 81.0 mmHg.  3. Left atrial size was moderately dilated.  4. The mitral valve is normal in structure. No evidence of mitral valve regurgitation. No evidence of mitral stenosis.  5. The aortic valve is tricuspid. There is mild calcification of the aortic valve. Aortic valve regurgitation is trivial. Aortic valve sclerosis is present, with no evidence of aortic valve stenosis.  6. The inferior vena cava is normal in size with greater than 50% respiratory variability, suggesting right atrial pressure of 3 mmHg. Comparison(s): No significant change from prior study. FINDINGS  Left Ventricle: Left ventricular ejection fraction, by  estimation, is 70 to 75%. The left ventricle has hyperdynamic function. The left ventricle has no regional wall motion abnormalities. The left ventricular internal cavity size was small. There is severe concentric left ventricular hypertrophy. Left ventricular diastolic parameters are consistent with Grade I diastolic dysfunction (impaired relaxation). Right Ventricle: The right ventricular size is normal. Right vetricular wall thickness was not well visualized. Right ventricular systolic function is normal. There is normal pulmonary artery systolic pressure. The tricuspid regurgitant velocity is 2.50 m/s, and with an assumed right atrial pressure of 3 mmHg, the estimated right ventricular systolic pressure is 17.5 mmHg. Left Atrium: Left atrial size was moderately dilated. Right Atrium: Right atrial size was normal in size. Pericardium: Trivial pericardial effusion is present. Mitral Valve: The mitral valve is normal in structure. No evidence of mitral valve regurgitation. No evidence of mitral valve stenosis. Tricuspid Valve: The tricuspid valve is normal in structure. Tricuspid valve regurgitation is trivial. No evidence of tricuspid stenosis. Aortic Valve: The aortic valve is tricuspid. There is mild calcification of the aortic valve. Aortic valve regurgitation is trivial. Aortic valve sclerosis is present, with no evidence of aortic valve stenosis. Pulmonic Valve: The pulmonic valve was not well visualized. Pulmonic valve regurgitation is not visualized. No evidence of pulmonic stenosis. Aorta: The aortic root, ascending aorta, aortic arch and descending aorta are all structurally normal, with no evidence of dilitation or obstruction. Venous: The inferior vena cava is normal in size with greater than 50% respiratory variability, suggesting right atrial pressure of 3 mmHg. IAS/Shunts: The atrial septum is grossly normal.  LEFT VENTRICLE PLAX 2D LVIDd:         3.50 cm   Diastology LVIDs:         2.10 cm   LV e'  medial:    5.11 cm/s LV PW:         1.50 cm   LV E/e' medial:  15.7 LV IVS:        1.70 cm   LV e' lateral:   7.07 cm/s LVOT diam:     1.80 cm   LV E/e' lateral: 11.3 LV SV:         61 LV SV Index:   37 LVOT Area:  2.54 cm  RIGHT VENTRICLE RV S prime:     14.80 cm/s TAPSE (M-mode): 2.5 cm LEFT ATRIUM             Index        RIGHT ATRIUM           Index LA diam:        3.80 cm 2.27 cm/m   RA Area:     14.70 cm LA Vol (A2C):   51.3 ml 30.70 ml/m  RA Volume:   34.50 ml  20.65 ml/m LA Vol (A4C):   60.3 ml 36.09 ml/m LA Biplane Vol: 59.9 ml 35.85 ml/m  AORTIC VALVE LVOT Vmax:   106.00 cm/s LVOT Vmean:  82.400 cm/s LVOT VTI:    0.240 m  AORTA Ao Root diam: 3.00 cm Ao Asc diam:  3.20 cm MITRAL VALVE                TRICUSPID VALVE MV Area (PHT): 2.65 cm     TR Peak grad:   25.0 mmHg MV Decel Time: 286 msec     TR Vmax:        250.00 cm/s MV E velocity: 80.10 cm/s MV A velocity: 103.00 cm/s  SHUNTS MV E/A ratio:  0.78         Systemic VTI:  0.24 m                             Systemic Diam: 1.80 cm Buford Dresser MD Electronically signed by Buford Dresser MD Signature Date/Time: 10/16/2021/12:13:18 PM    Final      Medications:     amLODipine  5 mg Oral Daily   atorvastatin  80 mg Oral Daily   carvedilol  12.5 mg Oral BID WC   gabapentin  100 mg Oral TID    HYDROmorphone (DILAUDID) injection  1 mg Intravenous Once   insulin aspart  0-5 Units Subcutaneous QHS   insulin aspart  0-6 Units Subcutaneous TID WC   isosorbide mononitrate  60 mg Oral Daily   loratadine  10 mg Oral Daily   torsemide  80 mg Oral BID   acetaminophen **OR** acetaminophen, albuterol, hydrALAZINE, ondansetron **OR** ondansetron (ZOFRAN) IV, oxyCODONE-acetaminophen, polyethylene glycol, polyvinyl alcohol  Assessment/ Plan:  CKD stage V.  Patient has been resistant to having vascular access placed.  AV graft placed 10/15/2021.  No urgent indications for dialysis would like to wait until graft is incorporated before  initiating dialysis treatments.  We will likely need dialysis within the next 3 to 4 weeks ANEMIA-not an issue at this point. 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  We will begin calcium acetate with meals HTN/VOL-appears improved with transition to oral diuretics.  Would like to try torsemide 80 mg twice daily.  2D echo ordered ACCESS- As per VVS placement of AV graft 10/15/2021      LOS: [place] @TODAY @8 :21 AM

## 2021-10-17 NOTE — Progress Notes (Signed)
PROGRESS NOTE    KRISTEL DURKEE  HER:[id number] DOB: 12/31/1945 DOA: 10/14/2021  PCP: Charlott Rakes, MD    Brief Narrative:  Paula [name] is a 76 year old female with PMH significant for HTN, HLD, DM 2, chronic diastolic congestive heart failure, cerebral aneurysm, CKD stage V who presented to [facility] ED on 3/6 via EMS with complaints of shortness of breath, dizziness, malaise, loss of appetite.  Patient reports onset for several months, has been progressing significantly over the last several days. Patient has been followed by nephrology outpatient, not yet on dialysis but admits still making urine.  Serum creatinine on arrival 6.99. ED physician discussed case with nephrology, Dr. [name] who recommended medicine admission and will likely need to initiate HD.  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     Assessment and Plan: * Acute renal failure superimposed on stage 5 chronic kidney disease, not on chronic dialysis [facility]) Patient presented to the ED with progressive shortness of breath and malaise.   Creatinine on admission 6.99 which has been steadily rising over the last several months.   Chest x-ray with no acute cardiopulmonary disease process, BNP within normal limits.   Nephrology has been recommending placement of HD catheter to start dialysis for some time.  Recent renal ultrasound 09/01/2021 with increased renal cortical echogenicity consistent with chronic medical renal disease.   She underwent right upper extremity AV graft placement by vascular surgery, Dr. Scot Dock on 10/15/2021.  Creatinine trending up 6.99>6.93>7.16>7.69 Continue torsemide 80 mg p.o. twice daily Strict I's and O's and daily weights. No urgent indication for dialysis, would wait until graft is incorporated. Will eventually need dialysis within the next 3 to 4 weeks.   Hypokalemia Replaced.  Continue to  monitor  Hyponatremia Hyponatremia resolved.  Type 2 diabetes mellitus without complication, without long-term current use of insulin (HCC) Hemoglobin A1c 7.6. Hold oral hypoglycemics while inpatient SSI for coverage CBGs qAC/HS  Essential hypertension Continue amlodipine, carvedilol, Imdur. Continue hydralazine as needed. Lasix changed with torsemide.  Hyperlipidemia --Atorvastatin 80 mg p.o. daily  Chronic diastolic CHF (congestive heart failure) ([place]) TTE 02/12/2021 with LVEF 60 to 85%, grade 1 diastolic dysfunction, severe LVH, left atrial enlargement, no aortic stenosis.  BMP within normal limits on admission.  Chest x-ray with no pulmonary edema. --Continue torsemide as above -- TTE: Pending --Strict I's and O's and daily weight  Physical deconditioning -- PT/OT consult: Pending    DVT prophylaxis: SCDs Start: 10/14/21 1919    Code Status: Full Code Family Communication:  No family at bed side. Disposition Plan:  Level of care: Med-Surg Status is: Inpatient Remains inpatient appropriate because: Status post AV graft, fistula per vascular surgery.  No urgent indication for dialysis. Eventually need hemodialysis in 3 to 4 weeks.    Consultants:  Nephrology, Dr. [name] Vascular surgery  Procedures:  Right upper extremity AV graft/fistula placement: Pending  Antimicrobials:  None   Subjective: Patient was seen and examined at bedside.  Overnight events noted. She reports having pain in the AV graft site.  She denies any other concerns.   Objective: Vitals:   10/16/21 1645 10/16/21 2107 10/17/21 0446 10/17/21 0915  BP: (!) 115/56 133/64 (!) 161/61 137/79  Pulse: 66 67 71 77  Resp: 18 18 18 18   Temp: 98.2 F (36.8 C) 98 F (36.7 C) 97.7 F (36.5 C) 98.4 F (36.9 C)  TempSrc:  Oral Oral   SpO2: 100%  99% 100% 97%  Weight:      Height:        Intake/Output Summary (Last 24 hours) at 10/17/2021 1358 Last data filed at 10/17/2021 0800 Gross per 24 hour   Intake 720 ml  Output --  Net 720 ml   Filed Weights   10/14/21 1248 10/15/21 1006 10/16/21 0500  Weight: 66 kg 66.2 kg 66.1 kg    Examination:  Physical Exam: GEN: Appears chronically ill looking, not in any acute distress. HEENT: PERRL, EOMI PULM: CTA bilaterally, no wheezing, no crackles. CV: S1-S2 heard, regular rate and rhythm, no murmur. GI: abd soft, NTND, NABS, no R/G/M MSK No edema, No cyanosis, muscle strength normal in both extremities.  AV graft noted in the right upper arm. NEURO: CN II-XII intact, no focal deficits, sensation to light touch intact PSYCH: normal mood/affect     Data Reviewed: I have personally reviewed following labs and imaging studies  CBC: Recent Labs  Lab 10/14/21 1256  WBC 5.0  HGB 10.3*  HCT 30.0*  MCV 82.9  PLT 379   Basic Metabolic Panel: Recent Labs  Lab 10/14/21 1256 10/15/21 0429 10/16/21 0335 10/17/21 0551  NA 132* 133* 136 135  K 3.1* 3.4* 3.7 3.3*  CL 95* 97* 98 96*  CO2 22 20* 22 22  GLUCOSE 201* 174* 166* 120*  BUN 106* 112* 118* 128*  CREATININE 6.99* 6.93* 7.16* 7.69*  CALCIUM 6.7* 6.9* 6.7* 7.0*  MG  --  2.1  --   --   PHOS  --  6.7* 7.3* 7.7*   GFR: Estimated Creatinine Clearance: 5.6 mL/min (A) (by C-G formula based on SCr of 7.69 mg/dL (H)). Liver Function Tests: Recent Labs  Lab 10/15/21 0429 10/16/21 0335 10/17/21 0551  ALBUMIN 3.3* 3.2* 3.2*   No results for input(s): LIPASE, AMYLASE in the last 168 hours. No results for input(s): AMMONIA in the last 168 hours. Coagulation Profile: Recent Labs  Lab 10/15/21 0429  INR 1.3*   Cardiac Enzymes: No results for input(s): CKTOTAL, CKMB, CKMBINDEX, TROPONINI in the last 168 hours. BNP (last 3 results) No results for input(s): PROBNP in the last 8760 hours. HbA1C: Recent Labs    10/15/21 0433  HGBA1C 7.6*   CBG: Recent Labs  Lab 10/16/21 1124 10/16/21 1644 10/16/21 2107 10/17/21 0738 10/17/21 1128  GLUCAP 201* 165* 199* 115* 283*    Lipid Profile: No results for input(s): CHOL, HDL, LDLCALC, TRIG, CHOLHDL, LDLDIRECT in the last 72 hours. Thyroid Function Tests: No results for input(s): TSH, T4TOTAL, FREET4, T3FREE, THYROIDAB in the last 72 hours. Anemia Panel: No results for input(s): VITAMINB12, FOLATE, FERRITIN, TIBC, IRON, RETICCTPCT in the last 72 hours. Sepsis Labs: No results for input(s): PROCALCITON, LATICACIDVEN in the last 168 hours.  Recent Results (from the past 240 hour(s))  Resp Panel by RT-PCR (Flu A&B, Covid) Nasopharyngeal Swab     Status: None   Collection Time: 10/14/21  3:35 PM   Specimen: Nasopharyngeal Swab; Nasopharyngeal(NP) swabs in vial transport medium  Result Value Ref Range Status   SARS Coronavirus 2 by RT PCR NEGATIVE NEGATIVE Final    Comment: (NOTE) SARS-CoV-2 target nucleic acids are NOT DETECTED.  The SARS-CoV-2 RNA is generally detectable in upper respiratory specimens during the acute phase of infection. The lowest concentration of SARS-CoV-2 viral copies this assay can detect is 138 copies/mL. A negative result does not preclude SARS-Cov-2 infection and should not be used as the sole basis for treatment or other patient management decisions.  A negative result may occur with  improper specimen collection/handling, submission of specimen other than nasopharyngeal swab, presence of viral mutation(s) within the areas targeted by this assay, and inadequate number of viral copies(<138 copies/mL). A negative result must be combined with clinical observations, patient history, and epidemiological information. The expected result is Negative.  Fact Sheet for Patients:  EntrepreneurPulse.com.au  Fact Sheet for Healthcare Providers:  IncredibleEmployment.be  This test is no t yet approved or cleared by the [place] FDA and  has been authorized for detection and/or diagnosis of SARS-CoV-2 by FDA under an Emergency Use Authorization  (EUA). This EUA will remain  in effect (meaning this test can be used) for the duration of the COVID-19 declaration under Section 564(b)(1) of the Act, 21 U.S.C.section 360bbb-3(b)(1), unless the authorization is terminated  or revoked sooner.       Influenza A by PCR NEGATIVE NEGATIVE Final   Influenza B by PCR NEGATIVE NEGATIVE Final    Comment: (NOTE) The Xpert Xpress SARS-CoV-2/FLU/RSV plus assay is intended as an aid in the diagnosis of influenza from Nasopharyngeal swab specimens and should not be used as a sole basis for treatment. Nasal washings and aspirates are unacceptable for Xpert Xpress SARS-CoV-2/FLU/RSV testing.  Fact Sheet for Patients: EntrepreneurPulse.com.au  Fact Sheet for Healthcare Providers: IncredibleEmployment.be  This test is not yet approved or cleared by the [place] FDA and has been authorized for detection and/or diagnosis of SARS-CoV-2 by FDA under an Emergency Use Authorization (EUA). This EUA will remain in effect (meaning this test can be used) for the duration of the COVID-19 declaration under Section 564(b)(1) of the Act, 21 U.S.C. section 360bbb-3(b)(1), unless the authorization is terminated or revoked.  Performed at [facility], [place] [address], [place], [place] [address]          Radiology Studies: ECHOCARDIOGRAM COMPLETE  Result Date: 10/16/2021    ECHOCARDIOGRAM REPORT   Patient Name:   [name] Date of Exam: 10/16/2021 Medical Rec #:  [id number]           Height:       62.0 in Accession #:    [id number]          Weight:       145.7 lb Date of Birth:  1946/06/25           BSA:          1.671 m Patient Age:    67 years            BP:           136/62 mmHg Patient Gender: F                   HR:           76 bpm. Exam Location:  Inpatient Procedure: 2D Echo, Color Doppler and Cardiac Doppler Indications:    R06.9 DOE  History:        Patient has prior history of Echocardiogram  examinations, most                 recent 02/22/2021. CHF; Risk Factors:Hypertension, Diabetes and                 Dyslipidemia.  Sonographer:    Raquel Sarna Senior RDCS Referring Phys: [place]  1. Left ventricular ejection fraction, by estimation, is 70 to 75%. The left ventricle has hyperdynamic function. The left ventricle has no regional wall motion abnormalities. There  is severe concentric left ventricular hypertrophy. Left ventricular diastolic parameters are consistent with Grade I diastolic dysfunction (impaired relaxation).  2. Right ventricular systolic function is normal. The right ventricular size is normal. There is normal pulmonary artery systolic pressure. The estimated right ventricular systolic pressure is 14.9 mmHg.  3. Left atrial size was moderately dilated.  4. The mitral valve is normal in structure. No evidence of mitral valve regurgitation. No evidence of mitral stenosis.  5. The aortic valve is tricuspid. There is mild calcification of the aortic valve. Aortic valve regurgitation is trivial. Aortic valve sclerosis is present, with no evidence of aortic valve stenosis.  6. The inferior vena cava is normal in size with greater than 50% respiratory variability, suggesting right atrial pressure of 3 mmHg. Comparison(s): No significant change from prior study. FINDINGS  Left Ventricle: Left ventricular ejection fraction, by estimation, is 70 to 75%. The left ventricle has hyperdynamic function. The left ventricle has no regional wall motion abnormalities. The left ventricular internal cavity size was small. There is severe concentric left ventricular hypertrophy. Left ventricular diastolic parameters are consistent with Grade I diastolic dysfunction (impaired relaxation). Right Ventricle: The right ventricular size is normal. Right vetricular wall thickness was not well visualized. Right ventricular systolic function is normal. There is normal pulmonary artery systolic  pressure. The tricuspid regurgitant velocity is 2.50 m/s, and with an assumed right atrial pressure of 3 mmHg, the estimated right ventricular systolic pressure is 70.2 mmHg. Left Atrium: Left atrial size was moderately dilated. Right Atrium: Right atrial size was normal in size. Pericardium: Trivial pericardial effusion is present. Mitral Valve: The mitral valve is normal in structure. No evidence of mitral valve regurgitation. No evidence of mitral valve stenosis. Tricuspid Valve: The tricuspid valve is normal in structure. Tricuspid valve regurgitation is trivial. No evidence of tricuspid stenosis. Aortic Valve: The aortic valve is tricuspid. There is mild calcification of the aortic valve. Aortic valve regurgitation is trivial. Aortic valve sclerosis is present, with no evidence of aortic valve stenosis. Pulmonic Valve: The pulmonic valve was not well visualized. Pulmonic valve regurgitation is not visualized. No evidence of pulmonic stenosis. Aorta: The aortic root, ascending aorta, aortic arch and descending aorta are all structurally normal, with no evidence of dilitation or obstruction. Venous: The inferior vena cava is normal in size with greater than 50% respiratory variability, suggesting right atrial pressure of 3 mmHg. IAS/Shunts: The atrial septum is grossly normal.  LEFT VENTRICLE PLAX 2D LVIDd:         3.50 cm   Diastology LVIDs:         2.10 cm   LV e' medial:    5.11 cm/s LV PW:         1.50 cm   LV E/e' medial:  15.7 LV IVS:        1.70 cm   LV e' lateral:   7.07 cm/s LVOT diam:     1.80 cm   LV E/e' lateral: 11.3 LV SV:         61 LV SV Index:   37 LVOT Area:     2.54 cm  RIGHT VENTRICLE RV S prime:     14.80 cm/s TAPSE (M-mode): 2.5 cm LEFT ATRIUM             Index        RIGHT ATRIUM           Index LA diam:        3.80 cm 2.27 cm/m  RA Area:     14.70 cm LA Vol (A2C):   51.3 ml 30.70 ml/m  RA Volume:   34.50 ml  20.65 ml/m LA Vol (A4C):   60.3 ml 36.09 ml/m LA Biplane Vol: 59.9 ml 35.85  ml/m  AORTIC VALVE LVOT Vmax:   106.00 cm/s LVOT Vmean:  82.400 cm/s LVOT VTI:    0.240 m  AORTA Ao Root diam: 3.00 cm Ao Asc diam:  3.20 cm MITRAL VALVE                TRICUSPID VALVE MV Area (PHT): 2.65 cm     TR Peak grad:   25.0 mmHg MV Decel Time: 286 msec     TR Vmax:        250.00 cm/s MV E velocity: 80.10 cm/s MV A velocity: 103.00 cm/s  SHUNTS MV E/A ratio:  0.78         Systemic VTI:  0.24 m                             Systemic Diam: 1.80 cm Buford Dresser MD Electronically signed by Buford Dresser MD Signature Date/Time: 10/16/2021/12:13:18 PM    Final     Scheduled Meds:  amLODipine  5 mg Oral Daily   atorvastatin  80 mg Oral Daily   calcium acetate  667 mg Oral TID WC   carvedilol  12.5 mg Oral BID WC   gabapentin  100 mg Oral TID    HYDROmorphone (DILAUDID) injection  1 mg Intravenous Once   insulin aspart  0-5 Units Subcutaneous QHS   insulin aspart  0-6 Units Subcutaneous TID WC   isosorbide mononitrate  60 mg Oral Daily   loratadine  10 mg Oral Daily   torsemide  80 mg Oral BID   Continuous Infusions:     LOS: 3 days    Time spent: 50 minutes    [name], MD [facility] Available via Epic secure chat 7am-7pm After these hours, please refer to coverage provider listed on [email or web address] 10/17/2021, 1:58 PM

## 2021-10-17 NOTE — Evaluation (Signed)
Physical Therapy Evaluation ?Patient Details ?Name: Paula [name] ?MRN: [id number] ?DOB: [birth year] ?Today's Date: 10/17/2021 ? ?History of Present Illness ? 76 y/o female presented to ED on 10/14/21 for SOB, dizziness/lightheadedness, malaise, and loss of appetite. 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 Admitted to initiate HD. Underwent R UE AV graft placement on 3/7. PMH: HTN, DM, CHF, cerebral aneurysm, CKD stage 5.  ?Clinical Impression ? Patient with above findings. Patient presents with generalized weakness, impaired balance, and decreased activity tolerance. Patient requiring minA for bed mobility and sit to stand. Patient ambulated hallway distance with HHAx1 and min guard. Encouraged use of rollator at home for safety and stability. Patient will benefit from skilled PT services during acute stay to address listed deficits. Recommend HHPT at discharge to maximize functional independence.  ?   ? ?Recommendations for follow up therapy are one component of a multi-disciplinary discharge planning process, led by the attending physician.  Recommendations may be updated based on patient status, additional functional criteria and insurance authorization. ? ?Follow Up Recommendations Home health PT ? ?  ?Assistance Recommended at Discharge Intermittent Supervision/Assistance  ?Patient can return home with the following ? A little help with walking and/or transfers;Assistance with cooking/housework;Assist for transportation;Help with stairs or ramp for entrance ? ?  ?Equipment Recommendations Rollator (4 wheels)  ?Recommendations for Other Services ?    ?  ?Functional Status Assessment Patient has had a recent decline in their functional status and demonstrates the ability to make significant improvements in function in a reasonable and predictable amount of time.  ? ?  ?Precautions / Restrictions Precautions ?Precautions: Fall ?Restrictions ?Weight Bearing Restrictions: No  ? ?   ? ?Mobility ? Bed Mobility ?Overal bed mobility: Needs Assistance ?Bed Mobility: Supine to Sit, Sit to Supine ?  ?  ?Supine to sit: Min assist ?Sit to supine: Supervision ?  ?General bed mobility comments: minA for trunk elevation to sit on EOB ?  ? ?Transfers ?Overall transfer level: Needs assistance ?Equipment used: 1 person hand held assist ?Transfers: Sit to/from Stand ?Sit to Stand: Min assist ?  ?  ?  ?  ?  ?General transfer comment: minA to stand from low bed surface ?  ? ?Ambulation/Gait ?Ambulation/Gait assistance: Min guard ?Gait Distance (Feet): 125 Feet ?Assistive device: 1 person hand held assist ?Gait Pattern/deviations: Step-through pattern, Decreased stride length, Narrow base of support ?Gait velocity: decreased ?  ?  ?General Gait Details: min guard for safety with HHAx1. generally unsteady and encouraged use of Terryl Niziolek at home for stability ? ?Stairs ?  ?  ?  ?  ?  ? ?Wheelchair Mobility ?  ? ?Modified Rankin (Stroke Patients Only) ?  ? ?  ? ?Balance Overall balance assessment: Needs assistance ?Sitting-balance support: Feet supported ?Sitting balance-Leahy Scale: Good ?  ?  ?Standing balance support: Single extremity supported, During functional activity ?Standing balance-Leahy Scale: Poor ?Standing balance comment: unsteady requiring external assist to maintain balance ?  ?  ?  ?  ?  ?  ?  ?  ?  ?  ?  ?   ? ? ? ?Pertinent Vitals/Pain Pain Assessment ?Pain Assessment: Faces ?Faces Pain Scale: Hurts even more ?Pain Location: R UE ?Pain Descriptors / Indicators: Grimacing, Guarding, Sore ?Pain Intervention(s): Repositioned, Premedicated before session, Monitored during session  ? ? ?Home Living Family/patient expects to be discharged to:: Private residence ?Living Arrangements: Children ?Available Help at Discharge: Family;Available PRN/intermittently ?Type of Home: Apartment ?Home Access: Stairs to  enter ?Entrance Stairs-Rails: Right ?Entrance Stairs-Number of Steps: 2 full flights ?  ?Home  Layout: One level ?Home Equipment: Shower seat;Toilet riser;Rollator (4 wheels) ?   ?  ?Prior Function Prior Level of Function : Needs assist ?  ?  ?  ?  ?  ?  ?Mobility Comments: Pt with difficulty navigating stairs therefore somewhat confined to home and is to start dialysis soon. "sometimes" uses Rollator for mobility ?ADLs Comments: Mod I for ADLs but daughter can assist prn on "bad days". Struggles with IADLS more due to fatigue.   Meal prep is challenging per pt. ?  ? ? ?Hand Dominance  ? Dominant Hand: Right ? ?  ?Extremity/Trunk Assessment  ? Upper Extremity Assessment ?Upper Extremity Assessment: Defer to OT evaluation ?  ? ?Lower Extremity Assessment ?Lower Extremity Assessment: LLE deficits/detail;RLE deficits/detail ?RLE Deficits / Details: Hip flexion 2/5, knee extension 3+/5, ankle dorsiflexion 3/5 ?LLE Deficits / Details: Hip flexion 4/5, knee extension 5/5, ankle dorsiflexion 3/5 ?  ? ?Cervical / Trunk Assessment ?Cervical / Trunk Assessment: Normal  ?Communication  ? Communication: No difficulties  ?Cognition Arousal/Alertness: Awake/alert ?Behavior During Therapy: [facility] for tasks assessed/performed ?Overall Cognitive Status: Within Functional Limits for tasks assessed ?  ?  ?  ?  ?  ?  ?  ?  ?  ?  ?  ?  ?  ?  ?  ?  ?  ?  ?  ? ?  ?General Comments   ? ?  ?Exercises    ? ?Assessment/Plan  ?  ?PT Assessment Patient needs continued PT services  ?PT Problem List Decreased strength;Decreased activity tolerance;Decreased mobility;Decreased balance ? ?   ?  ?PT Treatment Interventions DME instruction;Gait training;Functional mobility training;Stair training;Therapeutic activities;Therapeutic exercise;Balance training;Patient/family education   ? ?PT Goals (Current goals can be found in the Care Plan section)  ?Acute Rehab PT Goals ?Patient Stated Goal: return to independent ?PT Goal Formulation: With patient ?Time For Goal Achievement: 10/31/21 ?Potential to Achieve Goals: Good ? ?  ?Frequency Min 3X/week ?   ? ? ?Co-evaluation   ?  ?  ?  ?  ? ? ?  ?AM-PAC PT "6 Clicks" Mobility  ?Outcome Measure Help needed turning from your back to your side while in a flat bed without using bedrails?: None ?Help needed moving from lying on your back to sitting on the side of a flat bed without using bedrails?: A Little ?Help needed moving to and from a bed to a chair (including a wheelchair)?: A Little ?Help needed standing up from a chair using your arms (e.g., wheelchair or bedside chair)?: A Little ?Help needed to walk in hospital room?: A Little ?Help needed climbing 3-5 steps with a railing? : A Little ?6 Click Score: 19 ? ?  ?End of Session Equipment Utilized During Treatment: Gait belt ?Activity Tolerance: Patient tolerated treatment well ?Patient left: in bed;with call bell/phone within reach;with family/visitor present ?Nurse Communication: Mobility status ?PT Visit Diagnosis: Unsteadiness on feet (R26.81);Muscle weakness (generalized) (M62.81) ?  ? ?Time: 1205-1222 ?PT Time Calculation (min) (ACUTE ONLY): 17 min ? ? ?Charges:   PT Evaluation ?$PT Eval Moderate Complexity: 1 Mod ?  ?  ?   ? ? ?Rhiannan Kievit A. Gilford Rile, PT, DPT ?Acute Rehabilitation Services ?Pager [phone] ?Office [phone] ? ? ?Chanita Boden A Brixon Zhen ?10/17/2021, 1:16 PM ? ?

## 2021-10-17 NOTE — TOC Initial Note (Signed)
Transition of Care (TOC) - Initial/Assessment Note  ? ? ?Patient Details  ?Name: Paula [name] ?MRN: [id number] ?Date of Birth: [birth year] ? ?Transition of Care (TOC) CM/SW Contact:    ?Tom-Johnson, Renea Ee, RN ?Phone Number: ?10/17/2021, 3:09 PM ? ?Clinical Narrative:                 ? ?Patient is recently discharged from the hospital. Now admitted for ARF. Had AV graft placement by vascular  on 10/15/2021. No urgent indication for dialysis at this time, awaiting until graft is incorporated. Patient will eventually need dialysis within the next 3 to 4 weeks, per Nephrology. From home alone. Daughter, Kevan Ny lives close by and very supportive with care. Active with [facility] for PT/OT/Aide disciplines and will continue at discharge. CM will continue to follow with needs. ? ?Expected Discharge Plan: [place] ?Barriers to Discharge: Continued Medical Work up ? ? ?Patient Goals and CMS Choice ?Patient states their goals for this hospitalization and ongoing recovery are:: To return home ?CMS Medicare.gov Compare Post Acute Care list provided to:: Patient ?Choice offered to / list presented to : Patient, Adult Children (Daughter, Kevan Ny) ? ?Expected Discharge Plan and Services ?Expected Discharge Plan: [place] ?  ?Discharge Planning Services: CM Consult ?Post Acute Care Choice: Home Health ?Living arrangements for the past 2 months: Apartment ?                ?DME Arranged: N/A ?DME Agency: NA ?  ?  ?  ?HH Arranged: PT, OT, Nurse's Aide ?[place] Agency: [place] ?Date HH Agency Contacted: 10/17/21 ?Time [place]: 8413 ?Representative spoke with at [place]: [place] ? ?Prior Living Arrangements/Services ?Living arrangements for the past 2 months: Apartment ?Lives with:: Self ?Patient language and need for interpreter reviewed:: Yes ?Do you feel safe going back to the place where you live?: Yes      ?Need for Family Participation in Patient Care: Yes  (Comment) ?Care giver support system in place?: Yes (comment) ?Current home services: DME (Shower seat, rollator, bsc.) ?Criminal Activity/Legal Involvement Pertinent to Current Situation/Hospitalization: No - Comment as needed ? ?Activities of Daily Living ?  ?  ? ?Permission Sought/Granted ?Permission sought to share information with : Case Manager, Customer service manager, Family Supports ?Permission granted to share information with : Yes, Verbal Permission Granted ? Share Information with NAME: Amy ? Permission granted to share info w AGENCY: [facility] ?   ?   ? ?Emotional Assessment ?Appearance:: Appears stated age ?Attitude/Demeanor/Rapport: Engaged, Gracious ?Affect (typically observed): Accepting, Appropriate, Calm, Hopeful ?Orientation: : Oriented to Self, Oriented to Place, Oriented to  Time, Oriented to Situation ?Alcohol / Substance Use: Not Applicable ?Psych Involvement: No (comment) ? ?Admission diagnosis:  Shortness of breath [R06.02] ?Acute renal failure superimposed on stage 5 chronic kidney disease, not on chronic dialysis ([place]) [N17.9, N18.5] ?Acute renal failure superimposed on stage 4 chronic kidney disease, unspecified acute renal failure type ([place]) [N17.9, N18.4] ?Patient Active Problem List  ? Diagnosis Date Noted  ? Physical deconditioning 10/15/2021  ? Acute renal failure superimposed on stage 5 chronic kidney disease, not on chronic dialysis ([place]) 10/14/2021  ? Chronic kidney disease, stage 5 (HCC)   ? Hyponatremia   ? Aortic atherosclerosis ([place]) 06/24/2021  ? Hypertensive emergency 02/22/2021  ? Hypertensive urgency 02/21/2021  ? Syncope 02/21/2021  ? Gait disturbance 02/21/2021  ? Jaw pain 02/21/2021  ? Elevated troponin 02/21/2021  ? Upper airway cough  syndrome 10/12/2017  ? Chronic rhinitis 10/01/2017  ? Seasonal allergic conjunctivitis 10/01/2017  ? Aneurysm ([place]) 09/24/2017  ? Acute metabolic encephalopathy 57/08/7791  ? URI (upper respiratory infection) 09/02/2017  ? Facial  weakness 09/02/2017  ? Benign essential HTN 09/02/2017  ? Allergic rhinitis 08/20/2017  ? Chronic pain 05/07/2017  ? Palpitations 07/10/2016  ? Insomnia 03/25/2016  ? Gout 01/23/2016  ? History of cataract 02/27/2015  ? Seasonal and perennial allergic rhinitis 02/15/2012  ? Cough 11/04/2011  ? Diabetic neuropathy ([place]) 10/11/2011  ? Chronic diastolic CHF (congestive heart failure) ([place]) 10/11/2011  ? Hypokalemia 10/10/2011  ? CKD (chronic kidney disease) stage 3, GFR 30-59 ml/min (HCC) 10/10/2011  ? GERD (gastroesophageal reflux disease)   ? Chest pain 07/08/2010  ? Type 2 diabetes mellitus without complication, without long-term current use of insulin ([place]) 07/03/2010  ? Hyperlipidemia 07/03/2010  ? Essential hypertension 07/03/2010  ? ?PCP:  Charlott Rakes, MD ?Pharmacy:   ?[facility] [employer], [address] ?[place] [address] ?Phone: [phone] Fax: [phone] ? ?[facility] ?[address] ?[place] [address] ?Phone: [phone] Fax: [phone] ? ?[place], [place] - [address] ?[place] ?[place] [address] ?Phone: [phone] Fax: [phone] ? ? ? ? ?Social Determinants of Health (SDOH) Interventions ?  ? ?Readmission Risk Interventions ?Readmission Risk Prevention Plan 09/05/2021  ?Transportation Screening Complete  ?PCP or Specialist Appt within 5-7 Days Complete  ?Home Care Screening Complete  ?Medication Review (RN CM) Complete  ?Some recent data might be hidden  ? ? ? ?

## 2021-10-18 ENCOUNTER — Other Ambulatory Visit: Payer: Self-pay

## 2021-10-18 ENCOUNTER — Telehealth: Payer: Self-pay | Admitting: Family Medicine

## 2021-10-18 DIAGNOSIS — I130 Hypertensive heart and chronic kidney disease with heart failure and stage 1 through stage 4 chronic kidney disease, or unspecified chronic kidney disease: Secondary | ICD-10-CM

## 2021-10-18 DIAGNOSIS — E1122 Type 2 diabetes mellitus with diabetic chronic kidney disease: Secondary | ICD-10-CM

## 2021-10-18 LAB — GLUCOSE, CAPILLARY
Glucose-Capillary: 156 mg/dL — ABNORMAL HIGH (ref 70–99)
Glucose-Capillary: 215 mg/dL — ABNORMAL HIGH (ref 70–99)

## 2021-10-18 LAB — CBC
HCT: 27.5 % — ABNORMAL LOW (ref 36.0–46.0)
Hemoglobin: 9.9 g/dL — ABNORMAL LOW (ref 12.0–15.0)
MCH: 29.5 pg (ref 26.0–34.0)
MCHC: 36 g/dL (ref 30.0–36.0)
MCV: 81.8 fL (ref 80.0–100.0)
Platelets: 154 10*3/uL (ref 150–400)
RBC: 3.36 MIL/uL — ABNORMAL LOW (ref 3.87–5.11)
RDW: 14 % (ref 11.5–15.5)
WBC: 9.1 10*3/uL (ref 4.0–10.5)
nRBC: 0 % (ref 0.0–0.2)

## 2021-10-18 LAB — RENAL FUNCTION PANEL
Albumin: 3.2 g/dL — ABNORMAL LOW (ref 3.5–5.0)
Anion gap: 19 — ABNORMAL HIGH (ref 5–15)
BUN: 141 mg/dL — ABNORMAL HIGH (ref 8–23)
CO2: 19 mmol/L — ABNORMAL LOW (ref 22–32)
Calcium: 6.7 mg/dL — ABNORMAL LOW (ref 8.9–10.3)
Chloride: 93 mmol/L — ABNORMAL LOW (ref 98–111)
Creatinine, Ser: 7.99 mg/dL — ABNORMAL HIGH (ref 0.44–1.00)
GFR, Estimated: 5 mL/min — ABNORMAL LOW (ref >60)
Glucose, Bld: 146 mg/dL — ABNORMAL HIGH (ref 70–99)
Phosphorus: 7.2 mg/dL — ABNORMAL HIGH (ref 2.5–4.6)
Potassium: 3.7 mmol/L (ref 3.5–5.1)
Sodium: 131 mmol/L — ABNORMAL LOW (ref 135–145)

## 2021-10-18 LAB — MAGNESIUM: Magnesium: 2.1 mg/dL (ref 1.7–2.4)

## 2021-10-18 MED ORDER — TORSEMIDE 40 MG PO TABS
80.0000 mg | ORAL_TABLET | Freq: Two times a day (BID) | ORAL | 1 refills | Status: DC
  Filled 2021-10-18 – 2022-07-01 (×2): qty 60, fill #0

## 2021-10-18 MED ORDER — TORSEMIDE 20 MG PO TABS
ORAL_TABLET | ORAL | 4 refills | Status: DC
  Filled 2021-10-18: qty 120, 30d supply, fill #0
  Filled 2021-11-19: qty 120, 30d supply, fill #1
  Filled 2022-07-01: qty 120, 30d supply, fill #2

## 2021-10-18 MED ORDER — TORSEMIDE 40 MG PO TABS
80.0000 mg | ORAL_TABLET | Freq: Two times a day (BID) | ORAL | 1 refills | Status: DC
  Filled 2021-10-18: qty 60, 30d supply, fill #0

## 2021-10-18 MED ORDER — GUAIFENESIN-DM 100-10 MG/5ML PO SYRP: 5.0000 mL | ORAL_SOLUTION | ORAL | Status: DC | PRN

## 2021-10-18 MED ORDER — CALCIUM ACETATE (PHOS BINDER) 667 MG PO CAPS
667.0000 mg | ORAL_CAPSULE | Freq: Three times a day (TID) | ORAL | 1 refills | Status: DC
Start: 2021-10-18 — End: 2021-11-01
  Filled 2021-10-18: qty 30, 10d supply, fill #0
  Filled 2021-10-29 (×2): qty 30, 10d supply, fill #1

## 2021-10-18 NOTE — Progress Notes (Signed)
Patient given discharge instructions and stated understanding with no questions.   ?

## 2021-10-18 NOTE — Progress Notes (Signed)
DISCHARGE NOTE HOME ?Power to be discharged Home per MD order. Discussed prescriptions and follow up appointments with the patient. Prescriptions given to patient; medication list explained in detail. Patient verbalized understanding. ? ?Skin clean, dry and intact without evidence of skin break down, no evidence of skin tears noted. IV catheter discontinued intact. Site without signs and symptoms of complications. Dressing and pressure applied. Pt denies pain at the site currently. No complaints noted. ? ?Patient free of lines, drains, and wounds.  ? ?An After Visit Summary (AVS) was printed and given to the patient. ?Patient escorted via wheelchair, and discharged home via private auto. ? ?Vira Agar, RN  ?

## 2021-10-18 NOTE — Telephone Encounter (Signed)
Home Health Verbal Orders - Caller/Agency:[facility] ?Callback Number:[phone] ?Requesting OT ?Frequency: evaluation ?

## 2021-10-18 NOTE — Discharge Summary (Signed)
Physician Discharge Summary   Patient: Paula [name] MRN: [id number] DOB: 1945/11/30  Admit date:     10/14/2021  Discharge date: 10/18/21  Discharge Physician: [name]   PCP: Charlott Rakes, MD   Recommendations at discharge:  Advised to follow-up with primary care physician in a week. Advised to follow-up with Nephrology for initiation of hemodialysis. Patient underwent AV graft, awaiting maturation of AV fistula to start hemodialysis. Please check CBC and BMP in 1 week.  Discharge Diagnoses: Principal Problem:   Acute renal failure superimposed on stage 5 chronic kidney disease, not on chronic dialysis (HCC) Active Problems:   Hypokalemia   Hyponatremia   Type 2 diabetes mellitus without complication, without long-term current use of insulin (HCC)   Essential hypertension   Hyperlipidemia   Chronic diastolic CHF (congestive heart failure) (HCC)   Physical deconditioning  Resolved Problems:   * No resolved hospital problems. *  Hospital Course: Paula [name] is a 76 year old female with PMH significant for HTN, HLD, DM 2, chronic diastolic congestive heart failure, cerebral aneurysm, CKD stage V who presented to [facility] ED on 3/6 via EMS with complaints of shortness of breath, dizziness, malaise, loss of appetite.  Patient reports onset for several months, has been progressing significantly over the last several days. Patient has been followed by nephrology outpatient, not yet started on dialysis but admits still making urine.  Serum creatinine on arrival 6.99. ED physician discussed case with nephrology, Dr. [name] who recommended medicine admission and will likely need to initiate HD.  Patient was admitted for AKI on CKD stage IV nephrology and vascular surgery consulted.  Patient underwent AV fistula, renal functions remains steady.  Nephrologist stated no urgent indication for hemodialysis will wait for AV graft maturation to initiate hemodialysis.  Patient feels  better want to be discharged.  Patient is being discharged and she will follow-up with nephrology in 1 week.  Home health services been arranged.   Assessment and Plan: * Acute renal failure superimposed on stage 5 chronic kidney disease, not on chronic dialysis [facility]) Patient presented to the ED with progressive shortness of breath and malaise.   Creatinine on admission 6.99 which has been steadily rising over the last several months.   Chest x-ray with no acute cardiopulmonary disease process, BNP within normal limits.   Nephrology has recommended placement of HD catheter to start dialysis for some time.  Recent renal ultrasound 09/01/2021 with increased renal cortical echogenicity consistent with chronic medical renal disease.   She underwent right upper extremity AV graft placement by vascular surgery, Dr. Scot Dock on 10/15/2021.  Creatinine trending up 6.99>6.93>7.16>7.69>7.99 Continue torsemide 80 mg p.o. twice daily. Strict I's and O's and daily weights. No urgent indication for dialysis, would wait until graft is incorporated. Will eventually need dialysis within the next 3 to 4 weeks. Patient is cleared from nephrology to be discharged.   Hypokalemia Replaced and resolved.  Hyponatremia Hyponatremia resolved.  Type 2 diabetes mellitus without complication, without long-term current use of insulin (HCC) Hemoglobin A1c 7.6. Hold oral hypoglycemics.   Essential hypertension Continue amlodipine, carvedilol, Imdur. Continue hydralazine as needed. Lasix changed with torsemide.  Hyperlipidemia --Atorvastatin 80 mg p.o. daily  Chronic diastolic CHF (congestive heart failure) ([place]) TTE 02/12/2021 with LVEF 60 to 76%, grade 1 diastolic dysfunction, severe LVH, left atrial enlargement, no aortic stenosis.  BMP within normal limits on admission.  Chest x-ray with no pulmonary edema. Continue torsemide as above TTE unremarkable.  Physical deconditioning PT and OT recommended  home  PT   Consultants: Nephrology, vascular surgery Procedures performed: AV graft for initiation of hemodialysis Disposition: Home Diet recommendation:  Discharge Diet Orders (From admission, onward)     Start     Ordered   10/18/21 0000  Diet - low sodium heart healthy        10/18/21 1031   10/18/21 0000  Diet Carb Modified        10/18/21 1031           Renal diet DISCHARGE MEDICATION: Allergies as of 10/18/2021       Reactions   Dust Mite Extract Cough   Ace Inhibitors Cough   Lovastatin Other (See Comments)   Generalized body pain        Medication List     STOP taking these medications    calcitRIOL 0.25 MCG capsule Commonly known as: ROCALTROL   Darbepoetin Alfa 150 MCG/0.3ML Sosy injection Commonly known as: ARANESP   furosemide 40 MG tablet Commonly known as: LASIX   glipiZIDE 5 MG tablet Commonly known as: GLUCOTROL       TAKE these medications    Accu-Chek Guide test strip Generic drug: glucose blood Use as instructed 3 times daily   acetaminophen 500 MG tablet Commonly known as: TYLENOL Take 500 mg by mouth every 6 (six) hours as needed for fever, moderate pain or mild pain.   albuterol 108 (90 Base) MCG/ACT inhaler Commonly known as: ProAir HFA Inhale 1-2 puffs into the lungs every 6 (six) hours as needed for wheezing or shortness of breath.   amLODipine 5 MG tablet Commonly known as: NORVASC Take 1 tablet (5 mg total) by mouth daily.   aspirin EC 81 MG tablet Take 81 mg by mouth every evening.   atorvastatin 80 MG tablet Commonly known as: LIPITOR Take 1 tablet (80 mg total) by mouth daily.   calcium acetate 667 MG capsule Commonly known as: PHOSLO Take 1 capsule (667 mg total) by mouth 3 (three) times daily with meals.   carvedilol 12.5 MG tablet Commonly known as: COREG Take 1 tablet (12.5 mg total) by mouth 2 (two) times daily with a meal.   dextromethorphan 30 MG/5ML liquid Commonly known as: DELSYM Take 30 mg by  mouth daily as needed for cough.   EARACHE DROPS OT Place 1 drop into both ears daily.   fluticasone 50 MCG/ACT nasal spray Commonly known as: FLONASE Place 2 sprays into both nostrils daily.   gabapentin 100 MG capsule Commonly known as: NEURONTIN Take 100 mg by mouth 3 (three) times daily.   guaiFENesin-dextromethorphan 100-10 MG/5ML syrup Commonly known as: ROBITUSSIN DM Take 10 mLs by mouth every 4 (four) hours as needed for cough.   isosorbide mononitrate 60 MG 24 hr tablet Commonly known as: IMDUR TAKE 1 TABLET (60 MG TOTAL) BY MOUTH DAILY.   loratadine 10 MG tablet Commonly known as: CLARITIN Take 1 tablet (10 mg total) by mouth daily.   Misc. Devices [employer].  Diagnosis - unstable gait   SYSTANE OP Place 1 drop into both eyes daily.   Torsemide 40 MG Tabs Take 80 mg by mouth 2 (two) times daily.        Follow-up Information     [facility] Follow up in 2 week(s).   Specialty: Vascular Surgery Why: Office will call you to arrange your appt (sent) Contact information: [place]  Follow up.   Specialty: Rehabilitation Why: Someone will call you to schedule first home visit. Contact information: [place]. [place] [address] [phone]        Edrick Oh, MD Follow up in 1 week(s).   Specialty: Nephrology Contact information: [place] [address] [phone]                Discharge Exam: Filed Weights   10/15/21 1006 10/16/21 0500 10/18/21 0500  Weight: 66.2 kg 66.1 kg 68.7 kg   General exam: Appears comfortable, not in any acute distress.  Deconditioned Respiratory system: CTA bilaterally, no wheezing, no crackles, normal respiratory effort. Cardiovascular system: S1-S2 heard, regular rate and rhythm, no murmur. Gastrointestinal system: Abdomen is   soft, non tender, non distended, BS+. Central nervous system: Alert and oriented x3, no focal neurological deficits. Extremities: No edema, no cyanosis, no clubbing. Psychiatry:Mood, Insight, judgment normal.   Condition at discharge: good  The results of significant diagnostics from this hospitalization (including imaging, microbiology, ancillary and laboratory) are listed below for reference.   Imaging Studies: DG Chest Port 1 View  Result Date: 10/14/2021 CLINICAL DATA:  Shortness of breath. EXAM: PORTABLE CHEST 1 VIEW COMPARISON:  Chest x-ray 01/30/2022. FINDINGS: The heart size and mediastinal contours are within normal limits. Both lungs are clear. The visualized skeletal structures are unremarkable. IMPRESSION: No active disease. Electronically Signed   By: Ronney Asters M.D.   On: 10/14/2021 15:56   ECHOCARDIOGRAM COMPLETE  Result Date: 10/16/2021    ECHOCARDIOGRAM REPORT   Patient Name:   [name] Date of Exam: 10/16/2021 Medical Rec #:  [id number]           Height:       62.0 in Accession #:    [id number]          Weight:       145.7 lb Date of Birth:  [birth year]           BSA:          1.671 m Patient Age:    6 years            BP:           136/62 mmHg Patient Gender: F                   HR:           76 bpm. Exam Location:  Inpatient Procedure: 2D Echo, Color Doppler and Cardiac Doppler Indications:    R06.9 DOE  History:        Patient has prior history of Echocardiogram examinations, most                 recent 02/22/2021. CHF; Risk Factors:Hypertension, Diabetes and                 Dyslipidemia.  Sonographer:    Raquel Sarna Senior RDCS Referring Phys: [place]  1. Left ventricular ejection fraction, by estimation, is 70 to 75%. The left ventricle has hyperdynamic function. The left ventricle has no regional wall motion abnormalities. There is severe concentric left ventricular hypertrophy. Left ventricular diastolic parameters are consistent with Grade I  diastolic dysfunction (impaired relaxation).  2. Right ventricular systolic function is normal. The right ventricular size is normal. There is normal pulmonary artery systolic pressure. The estimated right ventricular systolic pressure is 24.2 mmHg.  3. Left atrial size was moderately dilated.  4. The mitral valve is  normal in structure. No evidence of mitral valve regurgitation. No evidence of mitral stenosis.  5. The aortic valve is tricuspid. There is mild calcification of the aortic valve. Aortic valve regurgitation is trivial. Aortic valve sclerosis is present, with no evidence of aortic valve stenosis.  6. The inferior vena cava is normal in size with greater than 50% respiratory variability, suggesting right atrial pressure of 3 mmHg. Comparison(s): No significant change from prior study. FINDINGS  Left Ventricle: Left ventricular ejection fraction, by estimation, is 70 to 75%. The left ventricle has hyperdynamic function. The left ventricle has no regional wall motion abnormalities. The left ventricular internal cavity size was small. There is severe concentric left ventricular hypertrophy. Left ventricular diastolic parameters are consistent with Grade I diastolic dysfunction (impaired relaxation). Right Ventricle: The right ventricular size is normal. Right vetricular wall thickness was not well visualized. Right ventricular systolic function is normal. There is normal pulmonary artery systolic pressure. The tricuspid regurgitant velocity is 2.50 m/s, and with an assumed right atrial pressure of 3 mmHg, the estimated right ventricular systolic pressure is 40.9 mmHg. Left Atrium: Left atrial size was moderately dilated. Right Atrium: Right atrial size was normal in size. Pericardium: Trivial pericardial effusion is present. Mitral Valve: The mitral valve is normal in structure. No evidence of mitral valve regurgitation. No evidence of mitral valve stenosis. Tricuspid Valve: The tricuspid valve is normal in  structure. Tricuspid valve regurgitation is trivial. No evidence of tricuspid stenosis. Aortic Valve: The aortic valve is tricuspid. There is mild calcification of the aortic valve. Aortic valve regurgitation is trivial. Aortic valve sclerosis is present, with no evidence of aortic valve stenosis. Pulmonic Valve: The pulmonic valve was not well visualized. Pulmonic valve regurgitation is not visualized. No evidence of pulmonic stenosis. Aorta: The aortic root, ascending aorta, aortic arch and descending aorta are all structurally normal, with no evidence of dilitation or obstruction. Venous: The inferior vena cava is normal in size with greater than 50% respiratory variability, suggesting right atrial pressure of 3 mmHg. IAS/Shunts: The atrial septum is grossly normal.  LEFT VENTRICLE PLAX 2D LVIDd:         3.50 cm   Diastology LVIDs:         2.10 cm   LV e' medial:    5.11 cm/s LV PW:         1.50 cm   LV E/e' medial:  15.7 LV IVS:        1.70 cm   LV e' lateral:   7.07 cm/s LVOT diam:     1.80 cm   LV E/e' lateral: 11.3 LV SV:         61 LV SV Index:   37 LVOT Area:     2.54 cm  RIGHT VENTRICLE RV S prime:     14.80 cm/s TAPSE (M-mode): 2.5 cm LEFT ATRIUM             Index        RIGHT ATRIUM           Index LA diam:        3.80 cm 2.27 cm/m   RA Area:     14.70 cm LA Vol (A2C):   51.3 ml 30.70 ml/m  RA Volume:   34.50 ml  20.65 ml/m LA Vol (A4C):   60.3 ml 36.09 ml/m LA Biplane Vol: 59.9 ml 35.85 ml/m  AORTIC VALVE LVOT Vmax:   106.00 cm/s LVOT Vmean:  82.400 cm/s LVOT VTI:  0.240 m  AORTA Ao Root diam: 3.00 cm Ao Asc diam:  3.20 cm MITRAL VALVE                TRICUSPID VALVE MV Area (PHT): 2.65 cm     TR Peak grad:   25.0 mmHg MV Decel Time: 286 msec     TR Vmax:        250.00 cm/s MV E velocity: 80.10 cm/s MV A velocity: 103.00 cm/s  SHUNTS MV E/A ratio:  0.78         Systemic VTI:  0.24 m                             Systemic Diam: 1.80 cm Buford Dresser MD Electronically signed by Buford Dresser MD Signature Date/Time: 10/16/2021/12:13:18 PM    Final     Microbiology: Results for orders placed or performed during the hospital encounter of 10/14/21  Resp Panel by RT-PCR (Flu A&B, Covid) Nasopharyngeal Swab     Status: None   Collection Time: 10/14/21  3:35 PM   Specimen: Nasopharyngeal Swab; Nasopharyngeal(NP) swabs in vial transport medium  Result Value Ref Range Status   SARS Coronavirus 2 by RT PCR NEGATIVE NEGATIVE Final    Comment: (NOTE) SARS-CoV-2 target nucleic acids are NOT DETECTED.  The SARS-CoV-2 RNA is generally detectable in upper respiratory specimens during the acute phase of infection. The lowest concentration of SARS-CoV-2 viral copies this assay can detect is 138 copies/mL. A negative result does not preclude SARS-Cov-2 infection and should not be used as the sole basis for treatment or other patient management decisions. A negative result may occur with  improper specimen collection/handling, submission of specimen other than nasopharyngeal swab, presence of viral mutation(s) within the areas targeted by this assay, and inadequate number of viral copies(<138 copies/mL). A negative result must be combined with clinical observations, patient history, and epidemiological information. The expected result is Negative.  Fact Sheet for Patients:  EntrepreneurPulse.com.au  Fact Sheet for Healthcare Providers:  IncredibleEmployment.be  This test is no t yet approved or cleared by the [place] FDA and  has been authorized for detection and/or diagnosis of SARS-CoV-2 by FDA under an Emergency Use Authorization (EUA). This EUA will remain  in effect (meaning this test can be used) for the duration of the COVID-19 declaration under Section 564(b)(1) of the Act, 21 U.S.C.section 360bbb-3(b)(1), unless the authorization is terminated  or revoked sooner.       Influenza A by PCR NEGATIVE NEGATIVE Final   Influenza  B by PCR NEGATIVE NEGATIVE Final    Comment: (NOTE) The Xpert Xpress SARS-CoV-2/FLU/RSV plus assay is intended as an aid in the diagnosis of influenza from Nasopharyngeal swab specimens and should not be used as a sole basis for treatment. Nasal washings and aspirates are unacceptable for Xpert Xpress SARS-CoV-2/FLU/RSV testing.  Fact Sheet for Patients: EntrepreneurPulse.com.au  Fact Sheet for Healthcare Providers: IncredibleEmployment.be  This test is not yet approved or cleared by the [place] FDA and has been authorized for detection and/or diagnosis of SARS-CoV-2 by FDA under an Emergency Use Authorization (EUA). This EUA will remain in effect (meaning this test can be used) for the duration of the COVID-19 declaration under Section 564(b)(1) of the Act, 21 U.S.C. section 360bbb-3(b)(1), unless the authorization is terminated or revoked.  Performed at [facility], [place] [address] [place], [place] [address]     Labs: CBC: Recent Labs  Lab 10/14/21 1256 10/18/21 0238  WBC 5.0 9.1  HGB 10.3* 9.9*  HCT 30.0* 27.5*  MCV 82.9 81.8  PLT 169 161   Basic Metabolic Panel: Recent Labs  Lab 10/14/21 1256 10/15/21 0429 10/16/21 0335 10/17/21 0551 10/18/21 0238  NA 132* 133* 136 135 131*  K 3.1* 3.4* 3.7 3.3* 3.7  CL 95* 97* 98 96* 93*  CO2 22 20* 22 22 19*  GLUCOSE 201* 174* 166* 120* 146*  BUN 106* 112* 118* 128* 141*  CREATININE 6.99* 6.93* 7.16* 7.69* 7.99*  CALCIUM 6.7* 6.9* 6.7* 7.0* 6.7*  MG  --  2.1  --   --  2.1  PHOS  --  6.7* 7.3* 7.7* 7.2*   Liver Function Tests: Recent Labs  Lab 10/15/21 0429 10/16/21 0335 10/17/21 0551 10/18/21 0238  ALBUMIN 3.3* 3.2* 3.2* 3.2*   CBG: Recent Labs  Lab 10/17/21 1128 10/17/21 1636 10/17/21 2044 10/18/21 0729 10/18/21 1139  GLUCAP 283* 167* 244* 156* 215*    Discharge time spent: greater than 30 minutes.  Signed: [name], MD [facility] 10/18/2021

## 2021-10-18 NOTE — Progress Notes (Signed)
Physical Therapy Treatment ?Patient Details ?Name: Paula [name] ?MRN: [id number] ?DOB: August 04, 1946 ?Today's Date: 10/18/2021 ? ? ?History of Present Illness 76 y/o female presented to ED on 10/14/21 for SOB, dizziness/lightheadedness, malaise, and loss of appetite. 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 Admitted to initiate HD. Underwent R UE AV graft placement on 3/7. PMH: HTN, DM, CHF, cerebral aneurysm, CKD stage 5. ? ?  ?PT Comments  ? ? Patient continues to require min guard for ambulation with HHAx1 due to patient declining use of RW stating "I'm not supposed to use my R arm". Provided education but patient continued to decline. Plan was to initiate stair training this session to ensure safe access to home as she has 2 flights to access apartment. Patient declined stair training stating "I can do stairs, slowly." D/c plan remains appropriate.  ? ?   ?Recommendations for follow up therapy are one component of a multi-disciplinary discharge planning process, led by the attending physician.  Recommendations may be updated based on patient status, additional functional criteria and insurance authorization. ? ?Follow Up Recommendations ? Home health PT ?  ?  ?Assistance Recommended at Discharge Intermittent Supervision/Assistance  ?Patient can return home with the following A little help with walking and/or transfers;Assistance with cooking/housework;Assist for transportation;Help with stairs or ramp for entrance ?  ?Equipment Recommendations ? Rollator (4 wheels)  ?  ?Recommendations for Other Services   ? ? ?  ?Precautions / Restrictions Precautions ?Precautions: Fall ?Restrictions ?Weight Bearing Restrictions: No  ?  ? ?Mobility ? Bed Mobility ?  ?  ?  ?  ?  ?  ?  ?General bed mobility comments: up in recliner on arrival ?  ? ?Transfers ?Overall transfer level: Needs assistance ?Equipment used: 1 person hand held assist ?Transfers: Sit to/from Stand ?Sit to Stand: Min assist ?  ?  ?  ?   ?  ?General transfer comment: minA to stand from recliner surface ?  ? ?Ambulation/Gait ?Ambulation/Gait assistance: Min guard ?Gait Distance (Feet): 140 Feet ?Assistive device: 1 person hand held assist ?Gait Pattern/deviations: Step-through pattern, Decreased stride length, Narrow base of support ?Gait velocity: decreased ?  ?  ?General Gait Details: use of HHAx1 as patient refused RW due to "I'm not supposed to use my R arm". Educated patient she is able to use RW for stability but patient continued to refuse. Min guard for safety with HHAx1 as patient is unsteady and heavily uses HHAx1 intermittently ? ? ?Stairs ?Stairs:  (declines stair training and states "I can do the stairs, slowly.") ?  ?  ?  ?  ? ? ?Wheelchair Mobility ?  ? ?Modified Rankin (Stroke Patients Only) ?  ? ? ?  ?Balance Overall balance assessment: Needs assistance ?Sitting-balance support: Feet supported ?Sitting balance-Leahy Scale: Good ?  ?  ?Standing balance support: Single extremity supported, During functional activity ?Standing balance-Leahy Scale: Poor ?Standing balance comment: unsteady requiring external assist to maintain balance ?  ?  ?  ?  ?  ?  ?  ?  ?  ?  ?  ?  ? ?  ?Cognition Arousal/Alertness: Awake/alert ?Behavior During Therapy: [facility] for tasks assessed/performed ?Overall Cognitive Status: Within Functional Limits for tasks assessed ?  ?  ?  ?  ?  ?  ?  ?  ?  ?  ?  ?  ?  ?  ?  ?  ?  ?  ?  ? ?  ?Exercises   ? ?  ?  General Comments   ?  ?  ? ?Pertinent Vitals/Pain Pain Assessment ?Pain Assessment: Faces ?Faces Pain Scale: Hurts a little bit ?Pain Location: R UE ?Pain Descriptors / Indicators: Grimacing, Guarding, Sore ?Pain Intervention(s): Monitored during session, Repositioned  ? ? ?Home Living   ?  ?  ?  ?  ?  ?  ?  ?  ?  ?   ?  ?Prior Function    ?  ?  ?   ? ?PT Goals (current goals can now be found in the care plan section) Acute Rehab PT Goals ?Patient Stated Goal: return to independent ?PT Goal Formulation: With  patient ?Time For Goal Achievement: 10/31/21 ?Potential to Achieve Goals: Good ?Progress towards PT goals: Progressing toward goals ? ?  ?Frequency ? ? ? Min 3X/week ? ? ? ?  ?PT Plan Current plan remains appropriate  ? ? ?Co-evaluation   ?  ?  ?  ?  ? ?  ?AM-PAC PT "6 Clicks" Mobility   ?Outcome Measure ? Help needed turning from your back to your side while in a flat bed without using bedrails?: None ?Help needed moving from lying on your back to sitting on the side of a flat bed without using bedrails?: A Little ?Help needed moving to and from a bed to a chair (including a wheelchair)?: A Little ?Help needed standing up from a chair using your arms (e.g., wheelchair or bedside chair)?: A Little ?Help needed to walk in hospital room?: A Little ?Help needed climbing 3-5 steps with a railing? : A Little ?6 Click Score: 19 ? ?  ?End of Session Equipment Utilized During Treatment: Gait belt ?Activity Tolerance: Patient tolerated treatment well ?Patient left: in chair;with call bell/phone within reach ?Nurse Communication: Mobility status ?PT Visit Diagnosis: Unsteadiness on feet (R26.81);Muscle weakness (generalized) (M62.81) ?  ? ? ?Time: 1607-3710 ?PT Time Calculation (min) (ACUTE ONLY): 18 min ? ?Charges:  $Gait Training: 8-22 mins          ?          ? ?Ellie Bryand A. Gilford Rile, PT, DPT ?Acute Rehabilitation Services ?Pager [phone] ?Office [phone] ? ? ? ?Daiel Strohecker A Jakaleb Payer ?10/18/2021, 11:19 AM ? ?

## 2021-10-18 NOTE — TOC Transition Note (Signed)
Transition of Care (TOC) - CM/SW Discharge Note ? ? ?Patient Details  ?Name: Paula [name] ?MRN: [id number] ?Date of Birth: 03-04-1946 ? ?Transition of Care (TOC) CM/SW Contact:  ?Tom-Johnson, Renea Ee, RN ?Phone Number: ?10/18/2021, 1:11 PM ? ? ?Clinical Narrative:    ? ?Patient is scheduled for discharge today. Home health continued with [facility]. [employer] cab voucher given. No further TOC needs noted.   ? ?Final next level of care: [place] ?Barriers to Discharge: Barriers Resolved ? ? ?Patient Goals and CMS Choice ?Patient states their goals for this hospitalization and ongoing recovery are:: To return home ?CMS Medicare.gov Compare Post Acute Care list provided to:: Patient ?Choice offered to / list presented to : Patient, Adult Children (Daughter, Kevan Ny) ? ?Discharge Placement ?  ?           ?  ?Patient to be transferred to facility by: [facility] ?  ?  ? ?Discharge Plan and Services ?  ?Discharge Planning Services: CM Consult ?Post Acute Care Choice: Home Health          ?DME Arranged: N/A ?DME Agency: NA ?  ?  ?  ?HH Arranged: PT, OT, Nurse's Aide ?[facility] Agency: [facility] ?Date HH Agency Contacted: 10/17/21 ?Time [place]: 0076 ?Representative spoke with at [facility]: [place] ? ?Social Determinants of Health (SDOH) Interventions ?  ? ? ?Readmission Risk Interventions ?Readmission Risk Prevention Plan 09/05/2021  ?Transportation Screening Complete  ?PCP or Specialist Appt within 5-7 Days Complete  ?Home Care Screening Complete  ?Medication Review (RN CM) Complete  ?Some recent data might be hidden  ? ? ? ? ? ?

## 2021-10-18 NOTE — Plan of Care (Signed)
?  Problem: Education: ?Goal: Knowledge of disease and its progression will improve ?Outcome: Adequate for Discharge ?Goal: Individualized Educational Video(s) ?Outcome: Adequate for Discharge ?  ?Problem: Fluid Volume: ?Goal: Compliance with measures to maintain balanced fluid volume will improve ?Outcome: Adequate for Discharge ?  ?Problem: Health Behavior/Discharge Planning: ?Goal: Ability to manage health-related needs will improve ?Outcome: Adequate for Discharge ?  ?Problem: Nutritional: ?Goal: Ability to make healthy dietary choices will improve ?Outcome: Adequate for Discharge ?  ?Problem: Clinical Measurements: ?Goal: Complications related to the disease process, condition or treatment will be avoided or minimized ?Outcome: Adequate for Discharge ?  ?Problem: Acute Rehab OT Goals (only OT should resolve) ?Goal: Pt. Will Perform Tub/Shower Transfer ?Outcome: Adequate for Discharge ?Goal: Pt/Caregiver Will Perform Home Exercise Program ?Outcome: Adequate for Discharge ?Goal: OT Additional ADL Goal #1 ?Outcome: Adequate for Discharge ?Goal: OT Additional ADL Goal #2 ?Outcome: Adequate for Discharge ?  ?Problem: Acute Rehab PT Goals(only PT should resolve) ?Goal: Pt Will Go Supine/Side To Sit ?Outcome: Adequate for Discharge ?Goal: Patient Will Transfer Sit To/From Stand ?Outcome: Adequate for Discharge ?Goal: Pt Will Ambulate ?Outcome: Adequate for Discharge ?Goal: Pt Will Go Up/Down Stairs ?Outcome: Adequate for Discharge ?  ?

## 2021-10-18 NOTE — Progress Notes (Signed)
Occupational Therapy Treatment ?Patient Details ?Name: Paula [name] ?MRN: [id number] ?DOB: 1945/08/27 ?Today's Date: 10/18/2021 ? ? ?History of present illness 76 y/o female presented to ED on 10/14/21 for SOB, dizziness/lightheadedness, malaise, and loss of appetite. 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 Admitted to initiate HD. Underwent R UE AV graft placement on 3/7. PMH: HTN, DM, CHF, cerebral aneurysm, CKD stage 5. ?  ?OT comments ? Patient progressing and showed improved ability to perform standing ADLs without external assistance for balance, compared to previous session. Pt able to perform UE dressing after education on compensatory methods to protect fistula site with setup/supervision, able to don underwear and pants in sitting over feet and standing over hips with supervision, and performed Sit<>Stand from recliner x 3 with supervision.  Patient remains limited by RT UE pain at fresh fistula site, generalized weakness and decreased activity tolerance along with deficits noted below. Pt continues to demonstrate good rehab potential and would benefit from continued skilled OT to increase safety and independence with ADLs and functional transfers to allow pt to return home safely and reduce caregiver burden and fall risk. ?  ? ?Recommendations for follow up therapy are one component of a multi-disciplinary discharge planning process, led by the attending physician.  Recommendations may be updated based on patient status, additional functional criteria and insurance authorization. ?   ?Follow Up Recommendations ? Home health OT  ?  ?Assistance Recommended at Discharge Set up Supervision/Assistance  ?Patient can return home with the following ? Assistance with cooking/housework;Assist for transportation;Help with stairs or ramp for entrance ?  ?Equipment Recommendations ? None recommended by OT  ?  ?Recommendations for Other Services   ? ?  ?Precautions / Restrictions  Precautions ?Precautions: Fall ?Precaution Comments: had a fall 8 months ago at home.  Lives in apartment complex not on main floor. Concerns for falls navigating steps. ?Restrictions ?Weight Bearing Restrictions: No ?Other Position/Activity Restrictions: states she does get dizzy at times and "blacks out" therefore she no longer drives  ? ? ?  ? ?Mobility Bed Mobility ?  ?  ?  ?  ?  ?  ?  ?General bed mobility comments: up in recliner on arrival ?  ? ?Transfers ?Overall transfer level:  (see ADL section) ?  ?  ?  ?  ?  ?  ?  ?  ?  ?  ?  ?Balance Overall balance assessment: Needs assistance ?Sitting-balance support: Feet supported ?Sitting balance-Leahy Scale: Good ?  ?  ?  ?Standing balance-Leahy Scale: Fair ?Standing balance comment: Fair standing without UE support or external assist to don underwear and pants over hips with static stand. ?  ?  ?  ?  ?  ?  ?  ?  ?  ?  ?  ?   ? ?ADL either performed or assessed with clinical judgement  ? ?ADL Overall ADL's : Needs assistance/impaired ?Eating/Feeding: Independent;Sitting ?  ?  ?Grooming Details (indicate cue type and reason): Pt declined ?  ?  ?  ?  ?Upper Body Dressing : Set up;Sitting;Cueing for compensatory techniques;Cueing for sequencing ?Upper Body Dressing Details (indicate cue type and reason): Pt educated on sequencing and compensatory strategies to don overhead shirt with new RT UE fistula. Pt followed correctly, donning over RT UE first and able to don bra using her own compensatory methods. ?Lower Body Dressing: Moderate assistance;Sitting/lateral leans;Sit to/from stand ?Lower Body Dressing Details (indicate cue type and reason): Pt able to doff her  gripper socks and don underwear and pants with setup. Pt educated to don both before standing for energy conservation.  Pt required moderate assistance to don shoes due to fatigue. Pt reports that her daughter will be able to assist her as well. ?Toilet Transfer: Supervision/safety ?Toilet Transfer Details  (indicate cue type and reason): Pt able to perform 3 sit<>Stands from recliner for ADLs with supervision. ?  ?  ?  ?  ?Functional mobility during ADLs: Supervision/safety ?  ?  ? ?Extremity/Trunk Assessment Upper Extremity Assessment ?Upper Extremity Assessment: RUE deficits/detail ?RUE Deficits / Details: some difficulty lifting RUE d/t pain with graft placement. encouraged gentle ROM within tolerance: Pt able to demonstrate shoulder FF to [facility], performed hand open/close and opposition exercises. ?RUE Coordination: WNL ?  ?  ?  ?Cervical / Trunk Assessment ?Cervical / Trunk Assessment: Normal ?  ? ?Vision Baseline Vision/History: 1 Wears glasses ?Ability to See in Adequate Light: 0 Adequate ?Patient Visual Report: No change from baseline ?Vision Assessment?: No apparent visual deficits ?  ?Perception   ?  ?Praxis   ?  ? ?Cognition Arousal/Alertness: Awake/alert ?Behavior During Therapy: [facility] for tasks assessed/performed ?Overall Cognitive Status: Within Functional Limits for tasks assessed ?  ?  ?  ?  ?  ?  ?  ?  ?  ?  ?  ?  ?  ?  ?  ?  ?  ?  ?  ?   ?Exercises   ? ?  ?Shoulder Instructions   ? ? ?  ?General Comments    ? ? ?Pertinent Vitals/ Pain       Pain Assessment ?Pain Assessment: 0-10 ?Pain Score: 4  ?Pain Location: R UE at fresh fistula site. ?Pain Descriptors / Indicators: Grimacing, Guarding, Sore ?Pain Intervention(s): Monitored during session, Limited activity within patient's tolerance ? ?Home Living   ?  ?  ?  ?  ?  ?  ?  ?  ?  ?  ?  ?  ?  ?  ?  ?  ?  ?  ? ?  ?Prior Functioning/Environment    ?  ?  ?  ?   ? ?Frequency ? Min 2X/week  ? ? ? ? ?  ?Progress Toward Goals ? ?OT Goals(current goals can now be found in the care plan section) ? Progress towards OT goals: Progressing toward goals ? ?Acute Rehab OT Goals ?Patient Stated Goal: Go home today ?OT Goal Formulation: With patient ?Time For Goal Achievement: 10/30/21 ?Potential to Achieve Goals: Good  ?Plan Discharge plan remains appropriate    ? ?Co-evaluation ? ? ?   ?  ?  ?  ?  ? ?  ?AM-PAC OT "6 Clicks" Daily Activity     ?Outcome Measure ? ? Help from another person eating meals?: None ?Help from another person taking care of personal grooming?: A Little ?Help from another person toileting, which includes using toliet, bedpan, or urinal?: A Little ?Help from another person bathing (including washing, rinsing, drying)?: A Little ?Help from another person to put on and taking off regular upper body clothing?: A Little ?Help from another person to put on and taking off regular lower body clothing?: A Little ?6 Click Score: 19 ? ?  ?End of Session   ? ?OT Visit Diagnosis: Other abnormalities of gait and mobility (R26.89);Muscle weakness (generalized) (M62.81) ?  ?Activity Tolerance Patient tolerated treatment well ?  ?Patient Left in chair;with call bell/phone within reach;with family/visitor present ?  ?Nurse Communication Other (comment) (  CNA in for BGL) ?  ? ?   ? ?Time: 9417-4081 ?OT Time Calculation (min): 27 min ? ?Charges: OT General Charges ?$OT Visit: 1 Visit ?OT Treatments ?$Self Care/Home Management : 8-22 mins ?$Therapeutic Activity: 8-22 mins ? ?Anderson Malta, OT ?Acute Rehab Services ?Office: [phone] ?10/18/2021 ? ?Julien Girt ?10/18/2021, 12:24 PM ?

## 2021-10-21 ENCOUNTER — Telehealth: Payer: Self-pay

## 2021-10-21 ENCOUNTER — Other Ambulatory Visit (HOSPITAL_COMMUNITY): Payer: Self-pay

## 2021-10-21 ENCOUNTER — Other Ambulatory Visit: Payer: Self-pay

## 2021-10-21 ENCOUNTER — Telehealth: Payer: Self-pay | Admitting: Family Medicine

## 2021-10-21 DIAGNOSIS — E1149 Type 2 diabetes mellitus with other diabetic neurological complication: Secondary | ICD-10-CM

## 2021-10-21 MED ORDER — ACCU-CHEK GUIDE VI STRP: ORAL_STRIP | 12 refills | Status: DC

## 2021-10-21 NOTE — Telephone Encounter (Signed)
Rx sent with updated instructions. ?

## 2021-10-21 NOTE — Telephone Encounter (Signed)
Transition Care Management Follow-up Telephone Call ? ?Date of discharge and from where: 10/18/1021, [facility]  ?How have you been since you were released from the hospital? The patient stated is is not easy but she has been managing.  Shee was coughing most of the time while on the phone and her daughter, Kevan Ny took over and completed answering the questions.  Kevan Ny said that her mother's arm is painful, she is tired and she has a strong frequent cough producing thick, slightly yellowish mucous.  ?Kevan Ny is questioning if the torsemide is working as well as the lasix worked.  ?Kevan Ny reported that her mother has no test strips for the glucometer and needs them refilled as soon as possible  She stated that the order for the test strips needs to state blood sugars are to be checked twice daily.  ?Any questions or concerns? Yes - noted above.  ? ?Items Reviewed: ?Did the pt receive and understand the discharge instructions provided? Yes  ?Medications obtained and verified? Yes  - Kevan Ny said that her mother has all of her medications and she has a glucometer.  ?Other? No  ?Any new allergies since your discharge? No  ?Dietary orders reviewed? Yes ?Do you have support at home? Yes  ? ?Home Care and Equipment/Supplies: ?Were home health services ordered? Yes -PT/OT/aide ?If so, what is the name of the agency? [facility]  ?Has the agency set up a time to come to the patient's home? no ?Were any new equipment or medical supplies ordered?  No ?What is the name of the medical supply agency? N/a ?Were you able to get the supplies/equipment? not applicable ?Do you have any questions related to the use of the equipment or supplies? No ? ?Functional Questionnaire: (I = Independent and D = Dependent) ?ADLs: uses rollator with ambulation Needs assistance with ADLs.  ? ?Follow up appointments reviewed: ? ?PCP Hospital f/u appt confirmed? Yes  Scheduled to see Dr Margarita Rana - 11/11/2021.  Her daughter requested the appointment after  11/06/2021.  ?[facility] f/u appt confirmed? Yes  Scheduled to see [facility] - 11/06/2021.  ?Are transportation arrangements needed? Yes  -provided her daughter the number for DSS transportation. Instructed her to call to schedule the ride for her mother  ?If their condition worsens, is the pt aware to call PCP or go to the Emergency Dept.? Yes ?Was the patient provided with contact information for the PCP's office or ED? Yes ?Was to pt encouraged to call back with questions or concerns? Yes ? ?

## 2021-10-21 NOTE — Telephone Encounter (Signed)
Done

## 2021-10-21 NOTE — Telephone Encounter (Signed)
Pt stated she was advised by [facility] that the Rx for glucose blood (ACCU-CHEK GUIDE) test strip needs to be rewritten to have instruction for using 2x's a day / insurance will not cover 3x's a day / please advise and send asap to  ?[place] [place] Phone:  [phone]  ?Fax:  [phone]  ?  ? ?

## 2021-10-21 NOTE — Telephone Encounter (Signed)
From the discharge summary: ? ?The patient stated is is not easy but she has been managing.  She was coughing most of the time while on the phone and her daughter, Kevan Ny took over and completed answering the questions.  Kevan Ny said that her mother's arm is painful, she is tired and she has a strong frequent cough producing thick, slightly yellowish mucous.  ? ?Kevan Ny is questioning if the torsemide is working as well as the lasix worked.  ? ?Ndeya reported that her mother has no test strips for the glucometer and needs them refilled as soon as possible  She stated that the order for the test strips needs to state blood sugars are to be checked twice daily.  ? ?Scheduled to see Dr Margarita Rana - 11/11/2021.  Her daughter requested the appointment after 11/06/2021.  ? ?

## 2021-10-21 NOTE — Telephone Encounter (Signed)
Patient is calling back about her test strips. She stated she is completely out and needs these ASAP please. ?

## 2021-10-22 NOTE — Telephone Encounter (Signed)
Okay to give verbal orders?  ?

## 2021-10-23 ENCOUNTER — Ambulatory Visit: Payer: Self-pay

## 2021-10-23 NOTE — Telephone Encounter (Signed)
Returned call , verbal orders provided ?

## 2021-10-23 NOTE — Telephone Encounter (Signed)
Kathlee Nations with [facility] calling to report pt. Reported to them this morning her left leg is swollen and she is having nose bleeds. Called pt. To triage. Left message to call back. ? ? ? ? ?Chief Complaint: Daughter states pt. Has several "things going on." Swelling to left foot and ankle. Has a productive cough with brown mucus, fever 99.1, has passed small blood clots from nose x 2. Bit her tongue which is "bruised looking now."  Pt. Had a AV graft surgery 10/15/21. ?Symptoms: Above ?Frequency: Started Sunday ?Pertinent Negatives: Patient denies  ?Disposition: [x] ED /[] Urgent Care (no appt availability in office) / [] Appointment(In office/virtual)/ []  [facility]/ [] Home Care/ [] Refused Recommended Disposition /[] [facility]/ []  Follow-up with PCP ?Additional Notes: Refuses ED. Requests an a home health nurse to come and check pt. [facility] called and states they would need an order. Please advise.  ?Reason for Disposition ? Patient sounds very sick or weak to the triager ? ?Answer Assessment - Initial Assessment Questions ?1. ONSET: "When did the swelling start?" (e.g., minutes, hours, days) ?    Sunday ?2. LOCATION: "What part of the leg is swollen?"  "Are both legs swollen or just one leg?" ?    Left leg ?3. SEVERITY: "How bad is the swelling?" (e.g., localized; mild, moderate, severe) ? - Localized - small area of swelling localized to one leg ? - MILD pedal edema - swelling limited to foot and ankle, pitting edema < 1/4 inch (6 mm) deep, rest and elevation eliminate most or all swelling ? - MODERATE edema - swelling of lower leg to knee, pitting edema > 1/4 inch (6 mm) deep, rest and elevation only partially reduce swelling ? - SEVERE edema - swelling extends above knee, facial or hand swelling present  ?    Ankle ?4. REDNESS: "Does the swelling look red or infected?" ?    No ?5. PAIN: "Is the swelling painful to touch?" If Yes, ask: "How painful is it?"   (Scale 1-10; mild,  moderate or severe) ?    Painful to touch ?6. FEVER: "Do you have a fever?" If Yes, ask: "What is it, how was it measured, and when did it start?"  ?    No ?7. CAUSE: "What do you think is causing the leg swelling?" ?    Unsure ?8. MEDICAL HISTORY: "Do you have a history of heart failure, kidney disease, liver failure, or cancer?" ?    Kidney failure ?9. RECURRENT SYMPTOM: "Have you had leg swelling before?" If Yes, ask: "When was the last time?" "What happened that time?" ?    Yes - from injury, 5 years ago ?10. OTHER SYMPTOMS: "Do you have any other symptoms?" (e.g., chest pain, difficulty breathing) ?      No ?11. PREGNANCY: "Is there any chance you are pregnant?" "When was your last menstrual period?" ?      No ? ?Protocols used: Leg Swelling and Edema-A-AH ? ?

## 2021-10-24 ENCOUNTER — Telehealth: Payer: Self-pay | Admitting: *Deleted

## 2021-10-24 NOTE — Telephone Encounter (Signed)
[place] ?Nursing evaluation- dizziness, recently dc'd glipizide at hospital discharge- glucose level 308 today ?BP 106/52 ?Fistula placement ? ?Patient is wanting to take glipizide- needs PCP instruction. Tried to call office- no answer- will send high priority message for provider response ? ? ?

## 2021-10-24 NOTE — Telephone Encounter (Signed)
Ok to go see patient. She has Torsemide for edema, please ensure she is taking it. Also needs to elevate feet. Please ask her to check her weight as we need to see if she is retaining fluid. If nose bleed is persisting she needs an urgent care visit. Thanks ?

## 2021-10-25 ENCOUNTER — Ambulatory Visit: Payer: Self-pay | Admitting: *Deleted

## 2021-10-25 ENCOUNTER — Telehealth: Payer: Self-pay | Admitting: Family Medicine

## 2021-10-25 ENCOUNTER — Other Ambulatory Visit: Payer: Self-pay | Admitting: Pharmacist

## 2021-10-25 DIAGNOSIS — E1149 Type 2 diabetes mellitus with other diabetic neurological complication: Secondary | ICD-10-CM

## 2021-10-25 MED ORDER — ACCU-CHEK GUIDE VI STRP: ORAL_STRIP | 12 refills | Status: DC

## 2021-10-25 NOTE — Telephone Encounter (Signed)
Call received from [facility] from [place] to request medication order clarification for glucose blood (ACCU-CHEK GUIDE) test strips.  ?Medicaid will not pay for more than once/ day order for test strips. Please send order for Rx using test strips once daily or fill out HU form.  Last ordered on 10/21/21 use as instructed 2 times daily. If more than once daily order is needed, please state reason why and complete High Utilization Form and send to pharmacy. Please contact Kayla at [phone]. ?

## 2021-10-25 NOTE — Telephone Encounter (Signed)
She can take her glipizide but needs to monitor her sugars closely to prevent hypoglycemia. ? ? ?

## 2021-10-25 NOTE — Telephone Encounter (Signed)
Patient name and DOB verified.  ? ?Advised patient of Dr. Margarita Rana message.  ?Patient states she is aware that message was sent to PCP.  ? ?She state she is unable to check blood sugars because she has not strips. Insurance will not for them unless she takes insulin.  ? ?She states strips are $25 for 50. Advised she could check blood sugars at least once daily. She is aware of hypoglycemic signs.  ? ? ?

## 2021-10-25 NOTE — Telephone Encounter (Signed)
Copied from [place] [phone]. Topic: General - Other ?>> Oct 25, 2021  2:40 PM Holley Dexter N wrote: ?Reason for CRM: Pt called in stating her medication strips glucose blood (ACCU-CHEK GUIDE) test strip was supposed to be changed to 1 times daily, instead of checking two times daily, pt states she is needing the new script sent over to the pharmacy. Please advise. ?

## 2021-10-25 NOTE — Telephone Encounter (Signed)
Addressed.

## 2021-10-25 NOTE — Telephone Encounter (Signed)
Routing to PCP for review.

## 2021-10-28 NOTE — Telephone Encounter (Signed)
Called pt made aware of RX sent to pharmacy 

## 2021-10-29 ENCOUNTER — Other Ambulatory Visit: Payer: Self-pay

## 2021-10-29 ENCOUNTER — Other Ambulatory Visit (HOSPITAL_COMMUNITY): Payer: Self-pay

## 2021-10-30 ENCOUNTER — Other Ambulatory Visit: Payer: Self-pay

## 2021-10-30 ENCOUNTER — Other Ambulatory Visit (HOSPITAL_COMMUNITY): Payer: Self-pay

## 2021-10-31 ENCOUNTER — Ambulatory Visit: Payer: Self-pay | Admitting: *Deleted

## 2021-10-31 ENCOUNTER — Telehealth: Payer: Self-pay | Admitting: Family Medicine

## 2021-10-31 ENCOUNTER — Other Ambulatory Visit: Payer: Self-pay

## 2021-10-31 MED ORDER — GLIPIZIDE ER 2.5 MG PO TB24
2.5000 mg | ORAL_TABLET | Freq: Every day | ORAL | 3 refills | Status: DC
  Filled 2021-10-31 – 2022-01-06 (×2): qty 30, 30d supply, fill #0
  Filled 2022-02-13: qty 30, 30d supply, fill #1
  Filled 2022-03-12: qty 30, 30d supply, fill #2
  Filled 2022-03-31 – 2022-04-04 (×2): qty 30, 30d supply, fill #3

## 2021-10-31 NOTE — Telephone Encounter (Signed)
Patient aware of message from Dr. Margarita Rana. Verbalized understanding regarding new Rx - Glipizide 2.5 mg in the a.m. with meals.  ? ?Verbalized understanding to advantage to extended release.  ? ?Advised to call office for additional concerns if any.  ?

## 2021-10-31 NOTE — Telephone Encounter (Signed)
Copied from [place] [phone]. Topic: General - Other ?>> Oct 31, 2021  3:07 PM Leward Quan A wrote: ?Reason for CRM: Patient called in and scheduled an appointment to come get lab work on Friday 11/01/21 asking if Dr Margarita Rana can please place order for Kidney function test since the Kidney Dr was very concerned. Asking if Dr Benard Rink nurse can call with an answer at Ph# [phone] ?

## 2021-10-31 NOTE — Telephone Encounter (Signed)
Please see my telephone message from 10/24/2021, she needs to resume glipizide.  I have sent a new prescription for the extended release tablet 2.5mg  which she should take once in the morning daily.  She does have old immediate release tablets of glipizide at home and I would like her to discontinue this.  The advantage of the extended release tablets is that she will not experience hypoglycemia in the morning but onset of action is about 6 hours by which time she has had her meals. ?

## 2021-10-31 NOTE — Telephone Encounter (Signed)
?  Summary: blood sugar, fluctuation  ? Kathlee Nations from [facility] called in states pt's blood sugar was 498 yesterday, but is 119 today, and she is not on her medicine, Glipizide,and has no other symptoms   ?  ? ? ?Called patient to review blood glucose readings. Message received from Rhine from [facility] that patient's Blood glucose yesterday was 498. Patient reports she took 2 glipizide 5 mg tablets and blood sugar came down. Denies symptoms. This am fasting glucose 119. Patient reports she take glipizide if her sugar if high. Glipizide not noted on medication list. Patient also reports Kidney Dr. Cristi Loron amlodipine on Tuesday. Reports BP is WNL no reading given. Please advise if appt needed. Reviewed if blood sugar greater than 250-300 call back and if sx noted. Patient verbalized understanding.  ? ? ?Reason for Disposition ? [1] Blood glucose > 300 mg/dL (16.7 mmol/L) AND [2] does not  use insulin (e.g., not insulin-dependent; most people with type 2 diabetes) ? ?Answer Assessment - Initial Assessment Questions ?1. BLOOD GLUCOSE: "What is your blood glucose level?"  ?    119 this am  yesterday blood glucose 498 per Kathlee Nations from [facility]  ?2. ONSET: "When did you check the blood glucose?" ?    Today  ?3. USUAL RANGE: "What is your glucose level usually?" (e.g., usual fasting morning value, usual evening value) ?    200 ?4. KETONES: "Do you check for ketones (urine or blood test strips)?" If yes, ask: "What does the test show now?"  ?    na ?5. TYPE 1 or 2:  "Do you know what type of diabetes you have?"  (e.g., Type 1, Type 2, Gestational; doesn't know)  ?    na ?6. INSULIN: "Do you take insulin?" "What type of insulin(s) do you use? What is the mode of delivery? (syringe, pen; injection or pump)?"  ?    na ?7. DIABETES PILLS: "Do you take any pills for your diabetes?" If yes, ask: "Have you missed taking any pills recently?" ?    Only takes glipizide if blood sugar high ?8. OTHER SYMPTOMS: "Do you have any symptoms?" (e.g., fever,  frequent urination, difficulty breathing, dizziness, weakness, vomiting) ?    Denies  ?9. PREGNANCY: "Is there any chance you are pregnant?" "When was your last menstrual period?" ?    na ? ?Protocols used: Diabetes - High Blood Sugar-A-AH ? ?

## 2021-11-01 ENCOUNTER — Other Ambulatory Visit: Payer: Self-pay | Admitting: Family Medicine

## 2021-11-01 ENCOUNTER — Other Ambulatory Visit: Payer: Self-pay

## 2021-11-01 ENCOUNTER — Ambulatory Visit: Payer: Medicare Other | Attending: Family Medicine

## 2021-11-01 DIAGNOSIS — E1149 Type 2 diabetes mellitus with other diabetic neurological complication: Secondary | ICD-10-CM

## 2021-11-01 MED ORDER — TORSEMIDE 40 MG PO TABS
ORAL_TABLET | ORAL | 3 refills | Status: DC
  Filled 2021-11-01: qty 30, 30d supply, fill #0

## 2021-11-01 MED ORDER — CALCIUM ACETATE (PHOS BINDER) 667 MG PO CAPS
ORAL_CAPSULE | ORAL | 6 refills | Status: DC
  Filled 2021-11-01 – 2021-11-11 (×3): qty 270, 90d supply, fill #0
  Filled 2022-02-17: qty 140, 46d supply, fill #1
  Filled 2022-02-19: qty 130, 44d supply, fill #1
  Filled 2022-05-19: qty 200, 66d supply, fill #2
  Filled 2022-05-20: qty 70, 24d supply, fill #2
  Filled 2022-09-01: qty 270, 90d supply, fill #3
  Filled 2022-10-14: qty 270, 90d supply, fill #4

## 2021-11-01 NOTE — Telephone Encounter (Signed)
Pt has had lab drawn. ?

## 2021-11-02 LAB — CMP14+EGFR
ALT: 17 IU/L (ref 0–32)
AST: 18 IU/L (ref 0–40)
Albumin/Globulin Ratio: 1.4 (ref 1.2–2.2)
Albumin: 3.6 g/dL — ABNORMAL LOW (ref 3.7–4.7)
Alkaline Phosphatase: 118 IU/L (ref 44–121)
BUN/Creatinine Ratio: 17 (ref 12–28)
BUN: 101 mg/dL (ref 8–27)
Bilirubin Total: 0.3 mg/dL (ref 0.0–1.2)
CO2: 24 mmol/L (ref 20–29)
Calcium: 7.1 mg/dL — ABNORMAL LOW (ref 8.7–10.3)
Chloride: 93 mmol/L — ABNORMAL LOW (ref 96–106)
Creatinine, Ser: 5.84 mg/dL — ABNORMAL HIGH (ref 0.57–1.00)
Globulin, Total: 2.6 g/dL (ref 1.5–4.5)
Glucose: 165 mg/dL — ABNORMAL HIGH (ref 70–99)
Potassium: 4.2 mmol/L (ref 3.5–5.2)
Sodium: 133 mmol/L — ABNORMAL LOW (ref 134–144)
Total Protein: 6.2 g/dL (ref 6.0–8.5)
eGFR: 7 mL/min/{1.73_m2} — ABNORMAL LOW (ref >59)

## 2021-11-04 NOTE — Progress Notes (Signed)
?POST OPERATIVE OFFICE NOTE ? ? ? ?CC:  F/u for surgery ? ?HPI:  This is a 76 y.o. female originally from [name] who is s/p new RUA AVG on 10/15/2021 by Dr. Scot Dock.   ? ?Pt states she does not have pain/numbness in the right hand.   ? ?The pt is not yet on dialysis. ? ? ? ?Allergies  ?Allergen Reactions  ? Dust Mite Extract Cough  ? Ace Inhibitors Cough  ? Lovastatin Other (See Comments)  ?  Generalized body pain  ? ? ?Current Outpatient Medications  ?Medication Sig Dispense Refill  ? acetaminophen (TYLENOL) 500 MG tablet Take 500 mg by mouth every 6 (six) hours as needed for fever, moderate pain or mild pain.    ? albuterol (PROAIR HFA) 108 (90 Base) MCG/ACT inhaler Inhale 1-2 puffs into the lungs every 6 (six) hours as needed for wheezing or shortness of breath. 8.5 g 2  ? amLODipine (NORVASC) 5 MG tablet Take 1 tablet (5 mg total) by mouth daily. 90 tablet 1  ? aspirin EC 81 MG tablet Take 81 mg by mouth every evening.    ? atorvastatin (LIPITOR) 80 MG tablet Take 1 tablet (80 mg total) by mouth daily. 90 tablet 1  ? calcium acetate (PHOSLO) 667 MG capsule Take 1 tablet by mouth 3 times daily 270 capsule 6  ? carvedilol (COREG) 12.5 MG tablet Take 1 tablet (12.5 mg total) by mouth 2 (two) times daily with a meal. 180 tablet 1  ? dextromethorphan (DELSYM) 30 MG/5ML liquid Take 30 mg by mouth daily as needed for cough.    ? fluticasone (FLONASE) 50 MCG/ACT nasal spray Place 2 sprays into both nostrils daily. 16 g 2  ? gabapentin (NEURONTIN) 100 MG capsule Take 100 mg by mouth 3 (three) times daily.    ? glipiZIDE (GLUCOTROL XL) 2.5 MG 24 hr tablet Take 1 tablet (2.5 mg total) by mouth daily with breakfast. 30 tablet 3  ? glucose blood (ACCU-CHEK GUIDE) test strip Use as instructed once daily 100 each 12  ? guaiFENesin-dextromethorphan (ROBITUSSIN DM) 100-10 MG/5ML syrup Take 10 mLs by mouth every 4 (four) hours as needed for cough. (Patient not taking: Reported on 10/14/2021) 118 mL 0  ? Homeopathic Products (EARACHE  DROPS OT) Place 1 drop into both ears daily.    ? isosorbide mononitrate (IMDUR) 60 MG 24 hr tablet TAKE 1 TABLET (60 MG TOTAL) BY MOUTH DAILY. 90 tablet 1  ? loratadine (CLARITIN) 10 MG tablet Take 1 tablet (10 mg total) by mouth daily. 30 tablet 11  ? Misc. Devices [employer].  Diagnosis - unstable gait 1 each 0  ? Polyethyl Glycol-Propyl Glycol (SYSTANE OP) Place 1 drop into both eyes daily.    ? torsemide (DEMADEX) 20 MG tablet Take 2 tablets by mouth 2 times daily 120 tablet 4  ? Torsemide 40 MG TABS Take 80 mg by mouth 2 (two) times daily. 60 tablet 1  ? Torsemide 40 MG TABS Take 1 tablet by mouth daily. 30 tablet 3  ? ?No current facility-administered medications for this visit.  ? ? ? ROS:  See HPI ? ?Physical Exam: ? ?Today's Vitals  ? 11/06/21 1354  ?BP: 124/61  ?Pulse: 68  ?Resp: 14  ?Temp: 97.9 ?F (36.6 ?C)  ?TempSrc: Temporal  ?SpO2: 100%  ?Weight: 141 lb (64 kg)  ?Height: 5\' 2"  (1.575 m)  ?PainSc: 3   ? ?Body mass index is 25.79 kg/m?. ? ? ?Incision:  both incisions have healed  nicely ?Extremities:   ?There is a palpable right radial pulse.   ?Motor and sensory are in tact.   ?There is a thrill/bruit present.  ?The fistula/graft is easily palpable ? ? ? ?Assessment/Plan:  This is a 76 y.o. female who is s/p: ?RUA AVG on 10/15/2021 by Dr. Scot Dock. ? ?-the pt does not have evidence of steal. ?-the fistula/graft can be used 11/15/2021. ?-discussed with pt that access does not last forever and will need intervention or even new access at some point.  She expressed understanding. ?-the pt will follow up as needed ? ? ?Leontine Locket, PAC ?[facility] ?[phone] ? ?Clinic MD:  Donzetta Matters ? ?

## 2021-11-05 ENCOUNTER — Other Ambulatory Visit: Payer: Self-pay

## 2021-11-05 ENCOUNTER — Other Ambulatory Visit (HOSPITAL_COMMUNITY): Payer: Self-pay

## 2021-11-05 ENCOUNTER — Telehealth: Payer: Self-pay | Admitting: Family Medicine

## 2021-11-05 NOTE — Telephone Encounter (Signed)
Pt is returning the office call for lab results. Pt says that she received a call but she wasn't available to receive.  ? ? ?[phone] - please assist pt further ?

## 2021-11-05 NOTE — Telephone Encounter (Signed)
Patient called and asked if she had additional questions about the lab results received on 11/04/21, she says it was her daughter who called. Advised to have the daughter to call back if she has questions, patient verbalized understanding. ?

## 2021-11-06 ENCOUNTER — Ambulatory Visit (INDEPENDENT_AMBULATORY_CARE_PROVIDER_SITE_OTHER): Payer: Medicare Other | Admitting: Physician Assistant

## 2021-11-06 ENCOUNTER — Encounter: Payer: Self-pay | Admitting: Physician Assistant

## 2021-11-06 VITALS — BP 124/61 | HR 68 | Temp 97.9°F | Resp 14 | Ht 62.0 in | Wt 141.0 lb

## 2021-11-06 DIAGNOSIS — N189 Chronic kidney disease, unspecified: Secondary | ICD-10-CM

## 2021-11-07 ENCOUNTER — Other Ambulatory Visit: Payer: Self-pay

## 2021-11-08 ENCOUNTER — Other Ambulatory Visit: Payer: Self-pay

## 2021-11-08 ENCOUNTER — Other Ambulatory Visit (HOSPITAL_COMMUNITY): Payer: Self-pay

## 2021-11-11 ENCOUNTER — Ambulatory Visit
Admission: RE | Admit: 2021-11-11 | Discharge: 2021-11-11 | Disposition: A | Discharge status: Home / Self Care | Payer: Medicare HMO | Source: Ambulatory Visit | Attending: Family Medicine | Admitting: Family Medicine

## 2021-11-11 ENCOUNTER — Other Ambulatory Visit (HOSPITAL_COMMUNITY): Payer: Self-pay

## 2021-11-11 ENCOUNTER — Encounter: Payer: Self-pay | Admitting: Family Medicine

## 2021-11-11 ENCOUNTER — Ambulatory Visit: Payer: Medicare HMO | Attending: Family Medicine | Admitting: Family Medicine

## 2021-11-11 ENCOUNTER — Other Ambulatory Visit: Payer: Self-pay

## 2021-11-11 VITALS — BP 155/62 | HR 68 | Ht 62.0 in | Wt 155.2 lb

## 2021-11-11 DIAGNOSIS — I132 Hypertensive heart and chronic kidney disease with heart failure and with stage 5 chronic kidney disease, or end stage renal disease: Secondary | ICD-10-CM | POA: Diagnosis not present

## 2021-11-11 DIAGNOSIS — I120 Hypertensive chronic kidney disease with stage 5 chronic kidney disease or end stage renal disease: Secondary | ICD-10-CM | POA: Diagnosis not present

## 2021-11-11 DIAGNOSIS — E1140 Type 2 diabetes mellitus with diabetic neuropathy, unspecified: Secondary | ICD-10-CM | POA: Insufficient documentation

## 2021-11-11 DIAGNOSIS — Z7984 Long term (current) use of oral hypoglycemic drugs: Secondary | ICD-10-CM | POA: Insufficient documentation

## 2021-11-11 DIAGNOSIS — E1149 Type 2 diabetes mellitus with other diabetic neurological complication: Secondary | ICD-10-CM

## 2021-11-11 DIAGNOSIS — M25551 Pain in right hip: Secondary | ICD-10-CM | POA: Insufficient documentation

## 2021-11-11 DIAGNOSIS — R439 Unspecified disturbances of smell and taste: Secondary | ICD-10-CM | POA: Diagnosis not present

## 2021-11-11 DIAGNOSIS — Z79899 Other long term (current) drug therapy: Secondary | ICD-10-CM | POA: Insufficient documentation

## 2021-11-11 DIAGNOSIS — G4709 Other insomnia: Secondary | ICD-10-CM | POA: Diagnosis not present

## 2021-11-11 DIAGNOSIS — E1122 Type 2 diabetes mellitus with diabetic chronic kidney disease: Secondary | ICD-10-CM | POA: Diagnosis not present

## 2021-11-11 DIAGNOSIS — R140 Abdominal distension (gaseous): Secondary | ICD-10-CM | POA: Diagnosis not present

## 2021-11-11 DIAGNOSIS — N185 Chronic kidney disease, stage 5: Secondary | ICD-10-CM | POA: Insufficient documentation

## 2021-11-11 DIAGNOSIS — M25552 Pain in left hip: Secondary | ICD-10-CM | POA: Diagnosis not present

## 2021-11-11 DIAGNOSIS — I5032 Chronic diastolic (congestive) heart failure: Secondary | ICD-10-CM | POA: Insufficient documentation

## 2021-11-11 DIAGNOSIS — E8779 Other fluid overload: Secondary | ICD-10-CM | POA: Insufficient documentation

## 2021-11-11 NOTE — Patient Instructions (Signed)
Insomnia ?Insomnia is a sleep disorder that makes it difficult to fall asleep or stay asleep. Insomnia can cause fatigue, low energy, difficulty concentrating, mood swings, and poor performance at work or school. ?There are three different ways to classify insomnia: ?Difficulty falling asleep. ?Difficulty staying asleep. ?Waking up too early in the morning. ?Any type of insomnia can be long-term (chronic) or short-term (acute). Both are common. Short-term insomnia usually lasts for three months or less. Chronic insomnia occurs at least three times a week for longer than three months. ?What are the causes? ?Insomnia may be caused by another condition, situation, or substance, such as: ?Anxiety. ?Certain medicines. ?Gastroesophageal reflux disease (GERD) or other gastrointestinal conditions. ?Asthma or other breathing conditions. ?Restless legs syndrome, sleep apnea, or other sleep disorders. ?Chronic pain. ?Menopause. ?Stroke. ?Abuse of alcohol, tobacco, or illegal drugs. ?Mental health conditions, such as depression. ?Caffeine. ?Neurological disorders, such as Alzheimer's disease. ?An overactive thyroid (hyperthyroidism). ?Sometimes, the cause of insomnia may not be known. ?What increases the risk? ?Risk factors for insomnia include: ?Gender. Women are affected more often than men. ?Age. Insomnia is more common as you get older. ?Stress. ?Lack of exercise. ?Irregular work schedule or working night shifts. ?Traveling between different time zones. ?Certain medical and mental health conditions. ?What are the signs or symptoms? ?If you have insomnia, the main symptom is having trouble falling asleep or having trouble staying asleep. This may lead to other symptoms, such as: ?Feeling fatigued or having low energy. ?Feeling nervous about going to sleep. ?Not feeling rested in the morning. ?Having trouble concentrating. ?Feeling irritable, anxious, or depressed. ?How is this diagnosed? ?This condition may be diagnosed  based on: ?Your symptoms and medical history. Your health care provider may ask about: ?Your sleep habits. ?Any medical conditions you have. ?Your mental health. ?A physical exam. ?How is this treated? ?Treatment for insomnia depends on the cause. Treatment may focus on treating an underlying condition that is causing insomnia. Treatment may also include: ?Medicines to help you sleep. ?Counseling or therapy. ?Lifestyle adjustments to help you sleep better. ?Follow these instructions at home: ?Eating and drinking ? ?Limit or avoid alcohol, caffeinated beverages, and cigarettes, especially close to bedtime. These can disrupt your sleep. ?Do not eat a large meal or eat spicy foods right before bedtime. This can lead to digestive discomfort that can make it hard for you to sleep. ?Sleep habits ? ?Keep a sleep diary to help you and your health care provider figure out what could be causing your insomnia. Write down: ?When you sleep. ?When you wake up during the night. ?How well you sleep. ?How rested you feel the next day. ?Any side effects of medicines you are taking. ?What you eat and drink. ?Make your bedroom a dark, comfortable place where it is easy to fall asleep. ?Put up shades or blackout curtains to block light from outside. ?Use a white noise machine to block noise. ?Keep the temperature cool. ?Limit screen use before bedtime. This includes: ?Watching TV. ?Using your smartphone, tablet, or computer. ?Stick to a routine that includes going to bed and waking up at the same times every day and night. This can help you fall asleep faster. Consider making a quiet activity, such as reading, part of your nighttime routine. ?Try to avoid taking naps during the day so that you sleep better at night. ?Get out of bed if you are still awake after 15 minutes of trying to sleep. Keep the lights down, but try reading or doing a   quiet activity. When you feel sleepy, go back to bed. ?General instructions ?Take over-the-counter  and prescription medicines only as told by your health care provider. ?Exercise regularly, as told by your health care provider. Avoid exercise starting several hours before bedtime. ?Use relaxation techniques to manage stress. Ask your health care provider to suggest some techniques that may work well for you. These may include: ?Breathing exercises. ?Routines to release muscle tension. ?Visualizing peaceful scenes. ?Make sure that you drive carefully. Avoid driving if you feel very sleepy. ?Keep all follow-up visits as told by your health care provider. This is important. ?Contact a health care provider if: ?You are tired throughout the day. ?You have trouble in your daily routine due to sleepiness. ?You continue to have sleep problems, or your sleep problems get worse. ?Get help right away if: ?You have serious thoughts about hurting yourself or someone else. ?If you ever feel like you may hurt yourself or others, or have thoughts about taking your own life, get help right away. You can go to your nearest emergency department or call: ?Your local emergency services (911 in the [place] ?A suicide crisis helpline, such as the National Suicide Prevention Lifeline at 1-800-273-8255 or 988 in the [place] This is open 24 hours a day. ?Summary ?Insomnia is a sleep disorder that makes it difficult to fall asleep or stay asleep. ?Insomnia can be long-term (chronic) or short-term (acute). ?Treatment for insomnia depends on the cause. Treatment may focus on treating an underlying condition that is causing insomnia. ?Keep a sleep diary to help you and your health care provider figure out what could be causing your insomnia. ?This information is not intended to replace advice given to you by your health care provider. Make sure you discuss any questions you have with your health care provider. ?Document Revised: 02/20/2021 Document Reviewed: 06/07/2020 ?Elsevier Patient Education ? 2022 Elsevier Inc. ? ?

## 2021-11-11 NOTE — Progress Notes (Signed)
Having pain in legs ?Not sleeping ?

## 2021-11-11 NOTE — Progress Notes (Signed)
? ?Subjective:  ?Patient ID: Paula [name], female    DOB: [birth year]  Age: 76 y.o. MRN: [id number] ? ?CC: Hospitalization Follow-up ? ? ?HPI ?Paula [name] is a 76 y.o. 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 ?She was hospitalized 10/14/2021 through 10/18/21 for dyspnea secondary to acute on chronic kidney failure.  This was treated with diuretics. ?She underwent right AV graft placement by vascular surgery and per notes no urgent indication for dialysis but would wait until graft is mature. ?Accompanied by her daughter to today's visit and daughter also supplements the history. ? ?Interval History: ?She complains of loss of taste and abdominal bloating since hospitalization. Currently is on Torsemide 40mg  bid as her dose was decreased by the PA per patient's daughter.  Review of her weight indicates she has gained 15 pounds in the last 5 days. ?She had a visit with vascular surgery 5 days ago and notes reviewed.  Per notes the fistula should be ready on/7/23. ? ?Her hips hurt and she feels she cannot stand for prolonged  periods; he knees also give out on her.  Currently ambulates with the aid of a walker. ?Bone density from 2019 was normal.  She has not been adherent with her PhosLo per her daughter ? ? ?BP at home when checked by [facility] RN is normal but is slightly elevated today at 155/62.  She endorses compliance with antihypertensive.  Amlodipine was discontinued by previous clinician due to complaints of dizziness. ?Blood sugar was 103 at home this morning.  She had previously called about hyperglycemia and so I had to restart her glipizide as she was previously on diet control. ? ?Insomnia has been present  for the last couple of weeks. She stays awake till 4am and watches TV since she cannot fall asleep.  At her last visit she had complained of excessive somnolence with 10 mg of Remeron and was advised to try 5 mg which she has not been doing. ?Past Medical History:  ?Diagnosis Date  ? Allergic rhinitis   ? Arthritis   ? Asthma   ? Brain aneurysm   ? Chronic kidney disease   ? Cough   ? Diabetes mellitus   ? Diastolic CHF, chronic ([place]) 10/11/2011  ? GERD (gastroesophageal reflux disease)   ? History of colon polyps 2012  ? tubular adenoma   ? Hyperlipidemia   ? Hypertension   ? Neuropathy 10/11/2011  ? PONV (postoperative nausea and vomiting)   ? one time after lymph node surgery  ? ? ?Past Surgical History:  ?Procedure Laterality Date  ? AV FISTULA PLACEMENT Right 10/15/2021  ? Procedure: ARTERIOVENOUS (AV) FISTULA  WITH PLACEMENT OF GORE-TEX STRETCH  GRAFT (4-46mmx45cm);  Surgeon: Angelia Mould, MD;  Location: [facility] OR;  Service: Vascular;  Laterality: Right;  ? CATARACT EXTRACTION    ? right eye  ? IR 3D INDEPENDENT WKST  12/14/2017  ? IR ANGIO INTRA EXTRACRAN SEL COM CAROTID INNOMINATE BILAT MOD SED  09/15/2017  ? IR ANGIO INTRA EXTRACRAN SEL COM CAROTID INNOMINATE UNI L MOD SED  12/14/2017  ? IR ANGIO VERTEBRAL SEL VERTEBRAL BILAT MOD SED  09/15/2017  ? IR RADIOLOGIST EVAL & MGMT  09/10/2017  ? IR RADIOLOGIST EVAL & MGMT  10/19/2017  ?  lymphatic mass surgery    ? NASAL TURBINATE REDUCTION    ? RADIOLOGY WITH ANESTHESIA N/A 12/14/2017  ? Procedure: RADIOLOGY WITH ANESTHESIA EMBOLIZATION;  Surgeon: Luanne Bras, MD;  Location: [facility];  Service: Radiology;  Laterality: N/A;  ? VIDEO BRONCHOSCOPY Bilateral 11/18/2017  ? Procedure: VIDEO BRONCHOSCOPY WITHOUT FLUORO;  Surgeon: Collene Gobble, MD;  Location: [facility];  Service: Cardiopulmonary;  Laterality: Bilateral;  ? ? ?Family History  ?Problem Relation Age of Onset  ? Hypertension Mother   ? Asthma Sister   ? Colon cancer Neg Hx   ? Allergic rhinitis  Neg Hx   ? Eczema Neg Hx   ? ? ?Social History  ? ?Socioeconomic History  ? Marital status: Divorced  ?  Spouse name: Not on file  ? Number of children: 5  ? Years of education: Not on file  ? Highest education level: Master's degree (e.g., MA, MS, MEng, MEd, MSW, MBA)  ?Occupational History  ? Occupation: Unemployed  ?Tobacco Use  ? Smoking status: Never  ? Smokeless tobacco: Never  ?Vaping Use  ? Vaping Use: Never used  ?Substance and Sexual Activity  ? Alcohol use: No  ? Drug use: No  ? Sexual activity: Not Currently  ?  Birth control/protection: Post-menopausal, None  ?Other Topics Concern  ? Not on file  ?Social History Narrative  ? Lives alone.  5 children with two deceased.    ? ?Social Determinants of Health  ? ?Financial Resource Strain: Not on file  ?Food Insecurity: Not on file  ?Transportation Needs: Not on file  ?Physical Activity: Not on file  ?Stress: Not on file  ?Social Connections: Not on file  ? ? ?Allergies  ?Allergen Reactions  ? Dust Mite Extract Cough  ? Ace Inhibitors Cough  ? Lovastatin Other (See Comments)  ?  Generalized body pain  ? ? ?Outpatient Medications Prior to Visit  ?Medication Sig Dispense Refill  ? acetaminophen (TYLENOL) 500 MG tablet Take 500 mg by mouth every 6 (six) hours as needed for fever, moderate pain or mild pain.    ? albuterol (PROAIR HFA) 108 (90 Base) MCG/ACT inhaler Inhale 1-2 puffs into the lungs every 6 (six) hours as needed for wheezing or shortness of breath. 8.5 g 2  ? aspirin EC 81 MG tablet Take 81 mg by mouth every evening.    ? atorvastatin (LIPITOR) 80 MG tablet Take 1 tablet (80 mg total) by mouth daily. 90 tablet 1  ? calcium acetate (PHOSLO) 667 MG capsule Take 1 capsule by mouth 3 times daily 270 capsule 6  ? carvedilol (COREG) 12.5 MG tablet Take 1 tablet (12.5 mg total) by mouth 2 (two) times daily with a meal. 180 tablet 1  ? dextromethorphan (DELSYM) 30 MG/5ML liquid Take 30 mg by mouth daily as needed for cough.    ? fluticasone (FLONASE) 50  MCG/ACT nasal spray Place 2 sprays into both nostrils daily. 16 g 2  ? gabapentin (NEURONTIN) 100 MG capsule Take 100 mg by mouth 3 (three) times daily.    ? glipiZIDE (GLUCOTROL XL) 2.5 MG 24 hr tablet Take 1 tablet (2.5 mg total) by mouth daily with breakfast. 30 tablet 3  ? glucose blood (ACCU-CHEK GUIDE) test strip Use as instructed once daily 100 each 12  ? guaiFENesin-dextromethorphan (ROBITUSSIN DM) 100-10 MG/5ML syrup Take 10 mLs by mouth every 4 (four) hours as needed for cough. 118 mL 0  ? Homeopathic Products (EARACHE DROPS OT) Place 1 drop into both ears  daily.    ? isosorbide mononitrate (IMDUR) 60 MG 24 hr tablet TAKE 1 TABLET (60 MG TOTAL) BY MOUTH DAILY. 90 tablet 1  ? loratadine (CLARITIN) 10 MG tablet Take 1 tablet (10 mg total) by mouth daily. 30 tablet 11  ? Misc. Devices [employer].  Diagnosis - unstable gait 1 each 0  ? Polyethyl Glycol-Propyl Glycol (SYSTANE OP) Place 1 drop into both eyes daily.    ? torsemide (DEMADEX) 20 MG tablet Take 2 tablets by mouth 2 times daily 120 tablet 4  ? Torsemide 40 MG TABS Take 80 mg by mouth 2 (two) times daily. 60 tablet 1  ? Torsemide 40 MG TABS Take 1 tablet by mouth daily. 30 tablet 3  ? amLODipine (NORVASC) 5 MG tablet Take 1 tablet (5 mg total) by mouth daily. (Patient not taking: Reported on 11/11/2021) 90 tablet 1  ? ?No facility-administered medications prior to visit.  ? ? ? ?ROS ?Review of Systems  ?Constitutional:  Negative for activity change, appetite change and fatigue.  ?HENT:  Negative for congestion, sinus pressure and sore throat.   ?Eyes:  Negative for visual disturbance.  ?Respiratory:  Negative for cough, chest tightness, shortness of breath and wheezing.   ?Cardiovascular:  Negative for chest pain and palpitations.  ?Gastrointestinal:  Negative for abdominal distention, abdominal pain and constipation.  ?Endocrine: Negative for polydipsia.  ?Genitourinary:  Negative for dysuria and frequency.  ?Musculoskeletal:   ?     See HPI  ?Skin:   Negative for rash.  ?Neurological:  Negative for tremors, light-headedness and numbness.  ?Hematological:  Does not bruise/bleed easily.  ?Psychiatric/Behavioral:  Positive for sleep disturbance. Negative

## 2021-11-12 ENCOUNTER — Other Ambulatory Visit: Payer: Self-pay

## 2021-11-12 ENCOUNTER — Other Ambulatory Visit (HOSPITAL_COMMUNITY): Payer: Self-pay

## 2021-11-12 ENCOUNTER — Other Ambulatory Visit: Payer: Medicare HMO

## 2021-11-13 ENCOUNTER — Other Ambulatory Visit: Payer: Self-pay

## 2021-11-13 ENCOUNTER — Emergency Department (HOSPITAL_COMMUNITY): Payer: Medicare HMO

## 2021-11-13 ENCOUNTER — Emergency Department (HOSPITAL_COMMUNITY)
Admission: EM | Admit: 2021-11-13 | Discharge: 2021-11-13 | Disposition: A | Discharge status: Home / Self Care | Payer: Medicare HMO | Attending: Emergency Medicine | Admitting: Emergency Medicine

## 2021-11-13 ENCOUNTER — Other Ambulatory Visit (HOSPITAL_COMMUNITY): Payer: Self-pay

## 2021-11-13 DIAGNOSIS — N186 End stage renal disease: Secondary | ICD-10-CM | POA: Diagnosis not present

## 2021-11-13 DIAGNOSIS — J45909 Unspecified asthma, uncomplicated: Secondary | ICD-10-CM | POA: Insufficient documentation

## 2021-11-13 DIAGNOSIS — R002 Palpitations: Secondary | ICD-10-CM | POA: Diagnosis present

## 2021-11-13 DIAGNOSIS — E1140 Type 2 diabetes mellitus with diabetic neuropathy, unspecified: Secondary | ICD-10-CM | POA: Diagnosis not present

## 2021-11-13 DIAGNOSIS — R778 Other specified abnormalities of plasma proteins: Secondary | ICD-10-CM | POA: Diagnosis not present

## 2021-11-13 DIAGNOSIS — Z7984 Long term (current) use of oral hypoglycemic drugs: Secondary | ICD-10-CM | POA: Diagnosis not present

## 2021-11-13 DIAGNOSIS — Z79899 Other long term (current) drug therapy: Secondary | ICD-10-CM | POA: Diagnosis not present

## 2021-11-13 DIAGNOSIS — E871 Hypo-osmolality and hyponatremia: Secondary | ICD-10-CM | POA: Diagnosis not present

## 2021-11-13 DIAGNOSIS — R944 Abnormal results of kidney function studies: Secondary | ICD-10-CM | POA: Insufficient documentation

## 2021-11-13 DIAGNOSIS — I5032 Chronic diastolic (congestive) heart failure: Secondary | ICD-10-CM | POA: Diagnosis not present

## 2021-11-13 DIAGNOSIS — Z992 Dependence on renal dialysis: Secondary | ICD-10-CM | POA: Diagnosis not present

## 2021-11-13 DIAGNOSIS — Z7951 Long term (current) use of inhaled steroids: Secondary | ICD-10-CM | POA: Diagnosis not present

## 2021-11-13 DIAGNOSIS — I132 Hypertensive heart and chronic kidney disease with heart failure and with stage 5 chronic kidney disease, or end stage renal disease: Secondary | ICD-10-CM | POA: Diagnosis not present

## 2021-11-13 DIAGNOSIS — R42 Dizziness and giddiness: Secondary | ICD-10-CM | POA: Diagnosis not present

## 2021-11-13 DIAGNOSIS — I10 Essential (primary) hypertension: Secondary | ICD-10-CM | POA: Diagnosis not present

## 2021-11-13 DIAGNOSIS — Z7982 Long term (current) use of aspirin: Secondary | ICD-10-CM | POA: Diagnosis not present

## 2021-11-13 DIAGNOSIS — D631 Anemia in chronic kidney disease: Secondary | ICD-10-CM | POA: Insufficient documentation

## 2021-11-13 DIAGNOSIS — E878 Other disorders of electrolyte and fluid balance, not elsewhere classified: Secondary | ICD-10-CM | POA: Insufficient documentation

## 2021-11-13 DIAGNOSIS — I160 Hypertensive urgency: Secondary | ICD-10-CM | POA: Diagnosis not present

## 2021-11-13 LAB — CBC WITH DIFFERENTIAL/PLATELET
Abs Immature Granulocytes: 0.01 10*3/uL (ref 0.00–0.07)
Basophils Absolute: 0 10*3/uL (ref 0.0–0.1)
Basophils Relative: 1 %
Eosinophils Absolute: 0.2 10*3/uL (ref 0.0–0.5)
Eosinophils Relative: 6 %
HCT: 25.1 % — ABNORMAL LOW (ref 36.0–46.0)
Hemoglobin: 8.7 g/dL — ABNORMAL LOW (ref 12.0–15.0)
Immature Granulocytes: 0 %
Lymphocytes Relative: 31 %
Lymphs Abs: 1.3 10*3/uL (ref 0.7–4.0)
MCH: 29.6 pg (ref 26.0–34.0)
MCHC: 34.7 g/dL (ref 30.0–36.0)
MCV: 85.4 fL (ref 80.0–100.0)
Monocytes Absolute: 0.5 10*3/uL (ref 0.1–1.0)
Monocytes Relative: 13 %
Neutro Abs: 2.1 10*3/uL (ref 1.7–7.7)
Neutrophils Relative %: 49 %
Platelets: 140 10*3/uL — ABNORMAL LOW (ref 150–400)
RBC: 2.94 MIL/uL — ABNORMAL LOW (ref 3.87–5.11)
RDW: 14.2 % (ref 11.5–15.5)
WBC: 4.2 10*3/uL (ref 4.0–10.5)
nRBC: 0 % (ref 0.0–0.2)

## 2021-11-13 LAB — URINALYSIS, ROUTINE W REFLEX MICROSCOPIC
Bilirubin Urine: NEGATIVE
Glucose, UA: NEGATIVE mg/dL
Hgb urine dipstick: NEGATIVE
Ketones, ur: NEGATIVE mg/dL
Nitrite: NEGATIVE
Protein, ur: 100 mg/dL — AB
Specific Gravity, Urine: 1.006 (ref 1.005–1.030)
pH: 7 (ref 5.0–8.0)

## 2021-11-13 LAB — COMPREHENSIVE METABOLIC PANEL
ALT: 19 U/L (ref 0–44)
AST: 20 U/L (ref 15–41)
Albumin: 3.4 g/dL — ABNORMAL LOW (ref 3.5–5.0)
Alkaline Phosphatase: 106 U/L (ref 38–126)
Anion gap: 12 (ref 5–15)
BUN: 87 mg/dL — ABNORMAL HIGH (ref 8–23)
CO2: 26 mmol/L (ref 22–32)
Calcium: 6.9 mg/dL — ABNORMAL LOW (ref 8.9–10.3)
Chloride: 95 mmol/L — ABNORMAL LOW (ref 98–111)
Creatinine, Ser: 6.97 mg/dL — ABNORMAL HIGH (ref 0.44–1.00)
GFR, Estimated: 6 mL/min — ABNORMAL LOW (ref >60)
Glucose, Bld: 121 mg/dL — ABNORMAL HIGH (ref 70–99)
Potassium: 4.2 mmol/L (ref 3.5–5.1)
Sodium: 133 mmol/L — ABNORMAL LOW (ref 135–145)
Total Bilirubin: 0.5 mg/dL (ref 0.3–1.2)
Total Protein: 6.8 g/dL (ref 6.5–8.1)

## 2021-11-13 LAB — TROPONIN I (HIGH SENSITIVITY)
Troponin I (High Sensitivity): 19 ng/L — ABNORMAL HIGH (ref <18)
Troponin I (High Sensitivity): 19 ng/L — ABNORMAL HIGH (ref <18)

## 2021-11-13 MED ORDER — AMLODIPINE BESYLATE 5 MG PO TABS
5.0000 mg | ORAL_TABLET | Freq: Every day | ORAL | 0 refills | Status: DC
  Filled 2021-11-13 – 2021-12-31 (×2): qty 30, 30d supply, fill #0

## 2021-11-13 MED ORDER — ISOSORBIDE MONONITRATE ER 30 MG PO TB24
60.0000 mg | ORAL_TABLET | Freq: Every day | ORAL | Status: DC
  Administered 2021-11-13: 60 mg via ORAL
  Filled 2021-11-13 (×2): qty 2

## 2021-11-13 MED ORDER — AMLODIPINE BESYLATE 5 MG PO TABS
5.0000 mg | ORAL_TABLET | Freq: Once | ORAL | Status: AC
  Administered 2021-11-13: 5 mg via ORAL
  Filled 2021-11-13: qty 1

## 2021-11-13 MED ORDER — CARVEDILOL 12.5 MG PO TABS
12.5000 mg | ORAL_TABLET | Freq: Two times a day (BID) | ORAL | Status: DC
  Administered 2021-11-13: 12.5 mg via ORAL
  Filled 2021-11-13: qty 1

## 2021-11-13 NOTE — ED Notes (Signed)
,  pt states she has trouble with on demand, pt has label specimen cup ? ?

## 2021-11-13 NOTE — ED Provider Notes (Signed)
?[name] ?Provider Note ? ? ?CSN: [id number] ?Arrival date & time: 11/13/21  0415 ? ?  ? ?History ?Chief Complaint  ?Patient presents with  ? Hypertension  ? ? ?Paula [name] is a 76 y.o. 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  Patient states that she was recently taken off her amlodipine approximately 1 month ago secondary to hypotension.  Patient states her blood pressure usually runs between 409 and 811 systolic.  She is been having some palpitations and headache.  No chest pain or shortness of breath.  No urinary complaints.  No abdominal pain. ? ? ?Hypertension ? ? ?  ? ?Home Medications ?Prior to Admission medications   ?Medication Sig Start Date End Date Taking? Authorizing Provider  ?acetaminophen (TYLENOL) 500 MG tablet Take 500 mg by mouth every 6 (six) hours as needed for fever, moderate pain or mild pain.   Yes [provider]  ?albuterol (PROAIR HFA) 108 (90 Base) MCG/ACT inhaler Inhale 1-2 puffs into the lungs every 6 (six) hours as needed for wheezing or shortness of breath. ?Patient taking differently: Inhale 2 puffs into the lungs every 6 (six) hours as needed for wheezing or shortness of breath. 10/01/21  Yes Newlin, Enobong, MD  ?amLODipine (NORVASC) 5 MG tablet Take 1 tablet (5 mg total) by mouth daily. 11/13/21  Yes [name] M, PA-C  ?aspirin EC 81 MG tablet Take 81 mg by mouth every evening.   Yes [provider]  ?atorvastatin (LIPITOR) 80 MG tablet Take 1 tablet (80 mg total) by mouth daily. 10/01/21  Yes Charlott Rakes, MD  ?carvedilol (COREG) 12.5 MG tablet Take 1 tablet (12.5 mg total) by mouth 2 (two) times daily with a meal. 10/01/21  Yes Charlott Rakes, MD  ?dextromethorphan (DELSYM) 30 MG/5ML liquid Take 30 mg by mouth daily as needed for cough.   Yes [provider]  ?gabapentin (NEURONTIN) 100 MG capsule Take 100 mg by mouth 3 (three) times daily. 09/05/21  Yes [provider]  ?Homeopathic Products (EARACHE DROPS OT) Place 1 drop into both ears daily.   Yes [provider]  ?isosorbide mononitrate (IMDUR) 60 MG 24 hr tablet TAKE 1 TABLET (60 MG TOTAL) BY MOUTH DAILY. ?Patient taking differently: Take 60 mg by mouth daily. 10/01/21 10/01/22 Yes Charlott Rakes, MD  ?loratadine (CLARITIN) 10 MG tablet Take 1 tablet (10 mg total) by mouth daily. 03/15/21  Yes Collene Gobble, MD  ?Polyethyl Glycol-Propyl Glycol (SYSTANE OP) Place 1 drop into both eyes daily.   Yes [provider]  ?torsemide (DEMADEX) 20 MG tablet Take 2 tablets by mouth 2 times daily 10/18/21  Yes Edrick Oh, MD  ?calcium acetate (PHOSLO) 667 MG capsule Take 1 capsule by mouth 3 times daily ?Patient not taking: Reported on 11/13/2021 10/31/21   Penninger, Ria Comment, PA  ?fluticasone (FLONASE) 50 MCG/ACT nasal spray Place 2 sprays into both nostrils daily. ?Patient not taking: Reported on 11/13/2021 03/15/21   Collene Gobble, MD  ?glipiZIDE (GLUCOTROL XL) 2.5 MG 24 hr tablet Take 1 tablet (2.5 mg total) by mouth daily with breakfast. ?Patient taking differently: Take 1.25 mg by mouth daily with breakfast. 10/31/21   Charlott Rakes, MD  ?glucose blood (ACCU-CHEK GUIDE) test strip Use as instructed once daily 10/25/21   Charlott Rakes, MD  ?guaiFENesin-dextromethorphan (ROBITUSSIN DM) 100-10 MG/5ML syrup  Take 10 mLs by mouth every 4 (four) hours as needed for cough. ?Patient not taking: Reported on 11/13/2021 09/05/21   [name], MD  ?[employer].  Diagnosis - unstable gait 03/05/21   Charlott Rakes, MD  ?Torsemide 40 MG TABS Take 80 mg by mouth 2 (two) times daily. ?Patient not taking: Reported on 11/13/2021 10/18/21   [name], MD  ?sodium chloride (OCEAN) 0.65 % SOLN nasal spray Place 2 sprays into both nostrils as needed for congestion. ?Patient not taking: No sig  reported 03/21/19 02/21/21  Martyn Ehrich, NP  ?   ? ?Allergies    ?Dust mite extract, Ace inhibitors, and Lovastatin   ? ?Review of Systems   ?Review of Systems  ?All other systems reviewed and are negative. ? ?Physical Exam ?Updated Vital Signs ?BP (!) 179/74   Pulse 70   Temp 98.1 ?F (36.7 ?C) (Oral)   Resp 20   LMP  (LMP Unknown)   SpO2 100%  ?Physical Exam ?Vitals and nursing note reviewed.  ?Constitutional:   ?   General: She is not in acute distress. ?   Appearance: Normal appearance.  ?HENT:  ?   Head: Normocephalic and atraumatic.  ?Eyes:  ?   General:     ?   Right eye: No discharge.     ?   Left eye: No discharge.  ?Cardiovascular:  ?   Comments: Regular rate and rhythm.  S1/S2 are distinct without any evidence of murmur, rubs, or gallops.  Radial pulses are 2+ bilaterally.  Dorsalis pedis pulses are 2+ bilaterally.  No evidence of pedal edema. ?Pulmonary:  ?   Comments: Clear to auscultation bilaterally.  Normal effort.  No respiratory distress.  No evidence of wheezes, rales, or rhonchi heard throughout. ?Abdominal:  ?   General: Abdomen is flat. Bowel sounds are normal. There is no distension.  ?   Tenderness: There is no abdominal tenderness. There is no guarding or rebound.  ?Musculoskeletal:     ?   General: Normal range of motion.  ?   Cervical back: Neck supple.  ?Skin: ?   General: Skin is warm and dry.  ?   Findings: No rash.  ?Neurological:  ?   General: No focal deficit present.  ?   Mental Status: She is alert.  ?Psychiatric:     ?   Mood and Affect: Mood normal.     ?   Behavior: Behavior normal.  ? ? ?ED Results / Procedures / Treatments   ?Labs ?(all labs ordered are listed, but only abnormal results are displayed) ?Labs Reviewed  ?COMPREHENSIVE METABOLIC PANEL - Abnormal; Notable for the following components:  ?    Result Value  ? Sodium 133 (*)   ? Chloride 95 (*)   ? Glucose, Bld 121 (*)   ? BUN 87 (*)   ? Creatinine, Ser 6.97 (*)   ? Calcium 6.9 (*)   ? Albumin 3.4 (*)   ? GFR,  Estimated 6 (*)   ? All other components within normal limits  ?CBC WITH DIFFERENTIAL/PLATELET - Abnormal; Notable for the following components:  ? RBC 2.94 (*)   ? Hemoglobin 8.7 (*)   ? HCT 25.1 (*)   ? Platelets 140 (*)   ? All other components within normal limits  ?URINALYSIS, ROUTINE W REFLEX MICROSCOPIC - Abnormal; Notable for the following components:  ? Color, Urine STRAW (*)   ? Protein, ur 100 (*)   ? Leukocytes,Ua TRACE (*)   ?  Bacteria, UA RARE (*)   ? All other components within normal limits  ?TROPONIN I (HIGH SENSITIVITY) - Abnormal; Notable for the following components:  ? Troponin I (High Sensitivity) 19 (*)   ? All other components within normal limits  ?TROPONIN I (HIGH SENSITIVITY) - Abnormal; Notable for the following components:  ? Troponin I (High Sensitivity) 19 (*)   ? All other components within normal limits  ? ? ?EKG ?EKG Interpretation ? ?Date/Time:  Wednesday November 13 2021 06:37:49 EDT ?Ventricular Rate:  65 ?PR Interval:  263 ?QRS Duration: 89 ?QT Interval:  455 ?QTC Calculation: 474 ?R Axis:   8 ?Text Interpretation: Sinus rhythm Prolonged PR interval Low voltage, precordial leads No significant change since last tracing Confirmed by Isla Pence [phone]) on 11/13/2021 7:28:54 AM ? ?Radiology ?DG Chest Portable 1 View ? ?Result Date: 11/13/2021 ?CLINICAL DATA:  Hypertension EXAM: PORTABLE CHEST 1 VIEW COMPARISON:  Radiograph 10/14/2021, chest CT 05/24/2021 FINDINGS: Unchanged cardiomediastinal silhouette. Mildly increased interstitial opacities. There is no large pleural effusion. There is no visible pneumothorax. No large pleural effusion. No visible pneumothorax. Bilateral shoulder osteoarthritis. No acute osseous abnormality. IMPRESSION: Mildly increased interstitial opacities in comparison to prior, possibly reflecting developing interstitial edema. Electronically Signed   By: Maurine Simmering M.D.   On: 11/13/2021 08:03  ? ?DG HIPS BILAT WITH PELVIS 2V ? ?Result Date:  11/12/2021 ?CLINICAL DATA:  Bilateral hip pain.  Chronic renal disease. EXAM: DG HIP (WITH OR WITHOUT PELVIS) 2V BILAT COMPARISON:  Lumbar spine 04/24/2018. FINDINGS: Degenerative changes lower lumbar spine and both hips. No acu

## 2021-11-13 NOTE — ED Triage Notes (Signed)
Pt called to report increasingly high b/p. Per EMS, pt was taken off of amlodipine x 3 days ago but has other b/p regiment. Pt reports HA and dizziness as well.  ? ? ?HX: ESRD, fistual in R arm ( has not received dialysis yet).  ?

## 2021-11-13 NOTE — ED Provider Triage Note (Signed)
Emergency Medicine Provider Triage Evaluation Note ? ?Paula [name] , a 76 y.o. female  was evaluated in triage.  Patient has a history of type 2 diabetes mellitus, CHF, palpitations, hypertensive emergency, CKD 5.  Pt complains of hypertension.  Patient states that she was previously on amlodipine for hypertension.  She states that she was experiencing symptoms consistent with orthostasis while taking this and it was discontinued about 1 month ago.  She states that she has not had any issues with her blood pressure until about 3 days ago when it became persistently high.  Reports an associated headache as well as lightheadedness that worsens particularly with standing.  Denies any chest pain, shortness of breath, visual changes, numbness, weakness, abdominal pain.  She confirms her history of CKD and states that she has a new AV fistula in her right arm but has not yet begun dialysis. ? ?Physical Exam  ?BP (!) 190/69 (BP Location: Left Arm)   Pulse 66   Temp 98.4 ?F (36.9 ?C) (Oral)   Resp 17   LMP  (LMP Unknown)   SpO2 98%  ?Gen:   Awake, no distress   ?Resp:  Normal effort  ?MSK:   Moves extremities without difficulty  ?Other:   ? ?Medical Decision Making  ?Medically screening exam initiated at 4:36 AM.  Appropriate orders placed.  Paula [name] was informed that the remainder of the evaluation will be completed by another provider, this initial triage assessment does not replace that evaluation, and the importance of remaining in the ED until their evaluation is complete. ?  ?Paula Sexton, PA-C ?11/13/21 1660 ? ?

## 2021-11-13 NOTE — Discharge Instructions (Signed)
Please start taking amlodipine 5 mg daily.  This should help get your blood pressure back under control.  I would like for you to follow-up with your primary care provider for further evaluation.  Return to the emergency department for any worsening symptoms. ?

## 2021-11-14 ENCOUNTER — Telehealth: Payer: Self-pay

## 2021-11-14 ENCOUNTER — Telehealth: Payer: Self-pay | Admitting: Family Medicine

## 2021-11-14 DIAGNOSIS — E1122 Type 2 diabetes mellitus with diabetic chronic kidney disease: Secondary | ICD-10-CM | POA: Diagnosis not present

## 2021-11-14 DIAGNOSIS — N185 Chronic kidney disease, stage 5: Secondary | ICD-10-CM | POA: Diagnosis not present

## 2021-11-14 DIAGNOSIS — D631 Anemia in chronic kidney disease: Secondary | ICD-10-CM | POA: Diagnosis not present

## 2021-11-14 DIAGNOSIS — Z992 Dependence on renal dialysis: Secondary | ICD-10-CM | POA: Diagnosis not present

## 2021-11-14 DIAGNOSIS — R6889 Other general symptoms and signs: Secondary | ICD-10-CM | POA: Diagnosis not present

## 2021-11-14 DIAGNOSIS — I120 Hypertensive chronic kidney disease with stage 5 chronic kidney disease or end stage renal disease: Secondary | ICD-10-CM | POA: Diagnosis not present

## 2021-11-14 DIAGNOSIS — N2581 Secondary hyperparathyroidism of renal origin: Secondary | ICD-10-CM | POA: Diagnosis not present

## 2021-11-14 DIAGNOSIS — N281 Cyst of kidney, acquired: Secondary | ICD-10-CM | POA: Diagnosis not present

## 2021-11-14 NOTE — Telephone Encounter (Signed)
Paula [name] was called and given verbal orders for patient. ?

## 2021-11-14 NOTE — Telephone Encounter (Unsigned)
Copied from [place] [id number]. Topic: Quick Communication - Home Health Verbal Orders ?>> Nov 14, 2021 11:20 AM Yvette Rack wrote: ?Caller/Agency: [facility] with [facility]  ?Callback Number: [phone] ?Requesting OT/PT/Skilled Nursing/Social Work/Speech Therapy: PT ?Frequency: 2 times a week for 3 weeks and 1 time a week for 3 weeks ?

## 2021-11-14 NOTE — Telephone Encounter (Signed)
-----   Message from Charlott Rakes, MD sent at 11/12/2021  8:37 AM EDT ----- ?X-ray reveals arthritis in her lower back and both hips which could explain her pain.  I can refer her to orthopedics if she would like otherwise she will need to continue with Tylenol for pain and continue to use her walker. ?

## 2021-11-14 NOTE — Telephone Encounter (Signed)
Pt would like to have referral placed for orthopedic. ?

## 2021-11-15 ENCOUNTER — Encounter: Payer: Self-pay | Admitting: Dietician

## 2021-11-15 ENCOUNTER — Encounter: Payer: Medicare HMO | Attending: Family Medicine | Admitting: Dietician

## 2021-11-15 ENCOUNTER — Ambulatory Visit: Payer: Medicare Other | Admitting: Dietician

## 2021-11-15 DIAGNOSIS — E1122 Type 2 diabetes mellitus with diabetic chronic kidney disease: Secondary | ICD-10-CM | POA: Diagnosis not present

## 2021-11-15 DIAGNOSIS — Z713 Dietary counseling and surveillance: Secondary | ICD-10-CM | POA: Insufficient documentation

## 2021-11-15 DIAGNOSIS — N185 Chronic kidney disease, stage 5: Secondary | ICD-10-CM

## 2021-11-15 DIAGNOSIS — I129 Hypertensive chronic kidney disease with stage 1 through stage 4 chronic kidney disease, or unspecified chronic kidney disease: Secondary | ICD-10-CM | POA: Insufficient documentation

## 2021-11-15 DIAGNOSIS — N189 Chronic kidney disease, unspecified: Secondary | ICD-10-CM | POA: Diagnosis not present

## 2021-11-15 DIAGNOSIS — E1149 Type 2 diabetes mellitus with other diabetic neurological complication: Secondary | ICD-10-CM | POA: Diagnosis not present

## 2021-11-15 DIAGNOSIS — E119 Type 2 diabetes mellitus without complications: Secondary | ICD-10-CM

## 2021-11-15 NOTE — Patient Instructions (Signed)
Continue a low sodium low phosphorous diet ?Continue to cook your own food and avoid processed foods ?Continue to eat a diet that is lower in protein ?Continue to limit your fluid intake to 5-6 cups daily ?Your potassium was normal recently and low previously.  At this time you do not need to follow a strict low potassium diet but choose foods that are lower in potassium most often. ?The fufu that you eat is very high in potassium.  Consider couscous (type of pasta) or millet instead. ?

## 2021-11-15 NOTE — Progress Notes (Signed)
Medical Nutrition Therapy  ?Appointment Start time:  1440  Appointment End time:  1540 ?Patient is here today with her daughter. ?She is here today to learn more about diet for CKD.  She recently had a graft for hemodialysis when needed. ?Glucose was 383 last night and is frequently 300-400 after her meals.  She took 1/2 of a glipizide and was 71 this am. ? ?Primary concerns today: renal diet  ?Referral diagnosis: Type 2 Diabetes, HTN, stage 4 CKD ?Preferred learning style:  no preference indicated ?Learning readiness: ready, change in progress ? ? ?NUTRITION ASSESSMENT  ? ?Anthropometrics  ?62" "now" ?150 lbs per patient 11/2021  ?141-147 lbs is UBW ? ?Clinical ?Medical Hx: Type 2 Diabetes, HTN, HLD, CHF, CKD, GERD ?Medications: phoslo, glipizide see list for others ?Labs: 11/13/2021:  BUN 87, Creatinine 6.97, Potassium 4.2, Sodium 133, eGFR 6 decreased from 14 12/2020, A1C 7.6%  ?Notable Signs/Symptoms: fluid retention at times ? ?Lifestyle & Dietary Hx ?Patient lives alone.  She does her shopping and cooking.  Her daughter also helps.  Her granddaughter from [name] has been invited to stay with her to help her. She is a retired Environmental consultant.  She moved from [name] 45 years ago to the [place].   ? ?Estimated daily fluid intake: 48 oz ?Supplements: none ?Current average weekly physical activity: walks with a walker ? ?24-Hr Dietary Recall ?Avoids all sugar, stopped eating eggs about 1 week ago when she started retain more fluid in her abdomen. ?Avoids pork or beef. ?Occasional chicken ?Regularly eats fish ?Avoids salt ?First Meal: oatmeal (old fashioned oatmeal), blueberries or occasional pancake (no sugar syrup or plain), occasional egg white and occasional Ghanna style bread ?Snack: none ?Second Meal: white rice, kale and spinach stew, (contained shrimp) ?Snack: fruit (apple), goya maria cookies ?Third Meal: fufu, soup ?Snack: fruit cup or applesauce ?Beverages: water ? ?Estimated Energy Needs ?Calories:  1800 ?Protein: 55 g without dialysis and 70 g with hemodialysis ? ?NUTRITION DIAGNOSIS  ?NB-1.1 Food and nutrition-related knowledge deficit As related to renal diet.  As evidenced by patient report. ? ? ?NUTRITION INTERVENTION  ?Nutrition education (E-1) on the following topics:  ?Renal diet without dialysis ?Renal diet with dialysis ?Adequate nutrition ?Potassium in her common foods ? ?Handouts Provided Include  ?National Kidney Diet placemat for those not on dialysis ?Choose a Meal book for dialysis ? ?Learning Style & Readiness for Change ?Teaching method utilized: Visual & Auditory  ?Demonstrated degree of understanding via: Teach Back  ?Barriers to learning/adherence to lifestyle change: none ? ?Goals Established by Pt ?Continue a low sodium low phosphorous diet ?Continue to cook your own food and avoid processed foods ?Continue to eat a diet that is lower in protein ?Continue to limit your fluid intake to 5-6 cups daily ?Your potassium was normal recently and low previously.  At this time you do not need to follow a strict low potassium diet but choose foods that are lower in potassium most often. ?The fufu that you eat is very high in potassium.  Consider couscous (type of pasta) or millet instead. ? ? ?MONITORING & EVALUATION ?Dietary intake in 1 month ? ?Next Steps  ?Patient is to call for questions. ?

## 2021-11-19 ENCOUNTER — Ambulatory Visit: Payer: Self-pay | Admitting: *Deleted

## 2021-11-19 ENCOUNTER — Other Ambulatory Visit: Payer: Self-pay

## 2021-11-19 DIAGNOSIS — I959 Hypotension, unspecified: Secondary | ICD-10-CM | POA: Diagnosis not present

## 2021-11-19 DIAGNOSIS — R42 Dizziness and giddiness: Secondary | ICD-10-CM | POA: Diagnosis not present

## 2021-11-19 NOTE — Telephone Encounter (Signed)
?  Chief Complaint: low BP, dizziness- PT calling ?Symptoms: low BP reading, dizziness ?Frequency: today ?Pertinent Negatives: Patient denies recent changes in medications, dehydration, edema ?Disposition: [x] ED /[] Urgent Care (no appt availability in office) / [] Appointment(In office/virtual)/ []  [facility]/ [] Home Care/ [] Refused Recommended Disposition /[] [facility]/ []  Follow-up with PCP ?Additional Notes: Advised ED/UC for evaluation- no open appointment for office evaluation ? ?Reason for Disposition ? [5] Fall in systolic BP > 20 mm Hg from normal AND [2] dizzy, lightheaded, or weak ? ?Answer Assessment - Initial Assessment Questions ?1. BLOOD PRESSURE: "What is the blood pressure?" "Did you take at least two measurements 5 minutes apart?" ?    98/40, 102/40 ?2. ONSET: "When did you take your blood pressure?" ?    1:35, 4:45 ?3. HOW: "How did you obtain the blood pressure?" (e.g., visiting nurse, automatic home BP monitor) ?    Manual cuff ?4. HISTORY: "Do you have a history of low blood pressure?" "What is your blood pressure normally?" ?    History high BP- problem regulating BP ?5. MEDICATIONS: "Are you taking any medications for blood pressure?" If Yes, ask: "Have they been changed recently?" ?    Patient has been taking all of medications, no changes ?6. PULSE RATE: "Do you know what your pulse rate is?"  ?    78 ?7. OTHER SYMPTOMS: "Have you been sick recently?" "Have you had a recent injury?" ?    Dizziness- PT is calling to report low readings ?8. PREGNANCY: "Is there any chance you are pregnant?" "When was your last menstrual period?" ? ?Protocols used: Blood Pressure - Low-A-AH ? ?

## 2021-11-20 ENCOUNTER — Telehealth: Payer: Self-pay

## 2021-11-20 NOTE — Telephone Encounter (Signed)
Copied from [place] [id number]. Topic: General - Other ?>> Sep 17, 2021 10:03 AM McGill, Nelva Bush wrote: ?Reason for CRM: Pt daughter stated pt would like to speak with Dr. Margarita Rana regarding a personal matter and/or via email. ? ?[email or web address]  ? ?Requesting call back. ? ? ?Late entry matter has been taken care off. ?

## 2021-11-26 ENCOUNTER — Other Ambulatory Visit: Payer: Self-pay

## 2021-11-26 DIAGNOSIS — N189 Chronic kidney disease, unspecified: Secondary | ICD-10-CM

## 2021-11-27 ENCOUNTER — Ambulatory Visit (INDEPENDENT_AMBULATORY_CARE_PROVIDER_SITE_OTHER): Payer: Medicare HMO | Admitting: Physician Assistant

## 2021-11-27 ENCOUNTER — Ambulatory Visit (HOSPITAL_COMMUNITY)
Admission: RE | Admit: 2021-11-27 | Discharge: 2021-11-27 | Disposition: A | Discharge status: Home / Self Care | Payer: Medicare HMO | Source: Ambulatory Visit | Attending: Vascular Surgery | Admitting: Vascular Surgery

## 2021-11-27 VITALS — BP 115/54 | HR 67 | Temp 98.6°F | Resp 20 | Ht 62.0 in | Wt 156.0 lb

## 2021-11-27 DIAGNOSIS — N189 Chronic kidney disease, unspecified: Secondary | ICD-10-CM

## 2021-11-27 DIAGNOSIS — R6889 Other general symptoms and signs: Secondary | ICD-10-CM | POA: Diagnosis not present

## 2021-11-27 NOTE — Progress Notes (Signed)
? ? ?  Postoperative Access Visit ? ? ?History of Present Illness  ? ?Paula [name] is a 76 y.o. year old female who presents for postoperative follow-up for: right upper arm arteriovenous graft by Dr. Scot Dock (Date: 10/15/21).  The patient's wounds are healed.  The patient denies steal symptoms.  The patient is able to complete their activities of daily living.  She is not yet on HD.  She has a follow up with Dr. Moshe Cipro on Friday, 28 April. ? ? ?Physical Examination  ? ?Vitals:  ? 11/27/21 1242  ?BP: (!) 115/54  ?Pulse: 67  ?Resp: 20  ?Temp: 98.6 ?F (37 ?C)  ?TempSrc: Temporal  ?SpO2: 98%  ?Weight: 156 lb (70.8 kg)  ?Height: 5\' 2"  (1.575 m)  ? ?Body mass index is 28.53 kg/m?. ? ?right arm Incisions are healed, palpable radial pulse, hand grip is 5/5, sensation in digits is intact, palpable thrill, bruit can be auscultated   ? ? ?Medical Decision Making  ? ?Paula [name] is a 76 y.o. year old female who presents s/p right upper arm arteriovenous graft ? ?Patent R arm AVG without signs or symptoms of steal syndrome ?The patient's access is ready for use  ?The patient may follow up on a prn basis ? ? ?Dagoberto Ligas PA-C ?[facility]  ?Office: [phone] ? ?Clinic MD: Scot Dock ? ?

## 2021-11-29 ENCOUNTER — Ambulatory Visit (HOSPITAL_COMMUNITY)
Admission: RE | Admit: 2021-11-29 | Discharge: 2021-11-29 | Disposition: A | Discharge status: Home / Self Care | Payer: Medicare HMO | Source: Ambulatory Visit | Attending: Nephrology | Admitting: Nephrology

## 2021-11-29 VITALS — BP 124/54 | HR 65 | Temp 97.3°F | Resp 20

## 2021-11-29 DIAGNOSIS — N185 Chronic kidney disease, stage 5: Secondary | ICD-10-CM | POA: Insufficient documentation

## 2021-11-29 LAB — POCT HEMOGLOBIN-HEMACUE: Hemoglobin: 8.6 g/dL — ABNORMAL LOW (ref 12.0–15.0)

## 2021-11-29 MED ORDER — EPOETIN ALFA-EPBX 10000 UNIT/ML IJ SOLN
INTRAMUSCULAR | Status: AC
  Filled 2021-11-29: qty 2

## 2021-11-29 MED ORDER — EPOETIN ALFA-EPBX 10000 UNIT/ML IJ SOLN
20000.0000 [IU] | INTRAMUSCULAR | Status: DC
  Administered 2021-11-29: 20000 [IU] via SUBCUTANEOUS

## 2021-12-02 ENCOUNTER — Telehealth: Payer: Self-pay | Admitting: *Deleted

## 2021-12-02 ENCOUNTER — Other Ambulatory Visit (HOSPITAL_COMMUNITY): Payer: Self-pay

## 2021-12-02 NOTE — Telephone Encounter (Signed)
Forwarding to patient's PCP so she is aware.  ?

## 2021-12-02 NOTE — Telephone Encounter (Signed)
[place] ?Reporting fall- patient fell yesterday getting in bath- reports no bruising/cuts/swelling. Patient reports she did not hit her head. Patient states she does feel achy all over- only symptom. Patient does not have hand rails in tub. ?

## 2021-12-03 NOTE — Telephone Encounter (Signed)
FYI

## 2021-12-06 ENCOUNTER — Telehealth: Payer: Self-pay | Admitting: Family Medicine

## 2021-12-06 DIAGNOSIS — I120 Hypertensive chronic kidney disease with stage 5 chronic kidney disease or end stage renal disease: Secondary | ICD-10-CM | POA: Diagnosis not present

## 2021-12-06 DIAGNOSIS — D631 Anemia in chronic kidney disease: Secondary | ICD-10-CM | POA: Diagnosis not present

## 2021-12-06 DIAGNOSIS — N2581 Secondary hyperparathyroidism of renal origin: Secondary | ICD-10-CM | POA: Diagnosis not present

## 2021-12-06 DIAGNOSIS — N185 Chronic kidney disease, stage 5: Secondary | ICD-10-CM | POA: Diagnosis not present

## 2021-12-06 DIAGNOSIS — N281 Cyst of kidney, acquired: Secondary | ICD-10-CM | POA: Diagnosis not present

## 2021-12-06 DIAGNOSIS — Z992 Dependence on renal dialysis: Secondary | ICD-10-CM | POA: Diagnosis not present

## 2021-12-06 DIAGNOSIS — N186 End stage renal disease: Secondary | ICD-10-CM | POA: Diagnosis not present

## 2021-12-06 DIAGNOSIS — E1122 Type 2 diabetes mellitus with diabetic chronic kidney disease: Secondary | ICD-10-CM | POA: Diagnosis not present

## 2021-12-06 NOTE — Telephone Encounter (Signed)
Copied from [place] [id number]. Topic: General - Inquiry ?>> Dec 06, 2021  2:21 PM Scherrie Gerlach wrote: ?Reason for CRM: daughter, Kevan Ny calling to follow up on a form that was mailed to your office last week for CAP assistance. ?Please call and advise if received.  Pt has to start dialysis. ?

## 2021-12-06 NOTE — Telephone Encounter (Signed)
VM was left informing her that paperwork has been received and will be called once ready for pick up. ?

## 2021-12-09 ENCOUNTER — Ambulatory Visit: Payer: Self-pay

## 2021-12-09 NOTE — Telephone Encounter (Signed)
Routing to PCP

## 2021-12-09 NOTE — Telephone Encounter (Signed)
Please verify her torsemide dose. ?

## 2021-12-09 NOTE — Telephone Encounter (Signed)
Glendell Docker, PTA with [facility] calling to report pt. Has a weight gain since 12/05/21. Weight 12/05/21 152 lbs. Today it is 155 lbs. Feet are "puffy" on top. Wants PCP aware. No other symptoms. ?Answer Assessment - Initial Assessment Questions ?1. REASON FOR CALL: "What is your main concern right now?" ?    Weight gain ?2. ONSET: "When did the  start?" ?    Today ?3. SEVERITY: "How bad is the ?" ?    Mild ?4. FEVER: "Do you have a fever?" ?    No ?5. OTHER SYMPTOMS: "Do you have any other new symptoms?" ?    No ?6. TREATMENTS AND RESPONSE: "What have you done so far to try to make this better? What medicines have you used?" ?    No ?7. PREGNANCY: "Is there any chance you are pregnant?" "When was your last menstrual period?" ?    No ? ?Protocols used: No Guideline Available-A-AH ? ?

## 2021-12-10 ENCOUNTER — Encounter (HOSPITAL_COMMUNITY): Payer: Self-pay

## 2021-12-10 ENCOUNTER — Other Ambulatory Visit: Payer: Self-pay

## 2021-12-10 ENCOUNTER — Other Ambulatory Visit (HOSPITAL_COMMUNITY): Payer: Self-pay

## 2021-12-10 MED ORDER — LIDOCAINE-PRILOCAINE 2.5-2.5 % EX CREA
TOPICAL_CREAM | CUTANEOUS | 99 refills | Status: AC
  Filled 2021-12-10: qty 30, 30d supply, fill #0
  Filled 2021-12-23: qty 30, 30d supply, fill #1
  Filled 2022-01-06: qty 30, 30d supply, fill #2
  Filled 2022-07-01: qty 30, 30d supply, fill #3
  Filled 2022-09-30: qty 30, 30d supply, fill #4

## 2021-12-10 NOTE — Telephone Encounter (Signed)
Please inform her to continue with current dose and report her subsequent weights after the last reported weight of 155 pounds. Thanks ?

## 2021-12-10 NOTE — Telephone Encounter (Signed)
Called pt and she stated that she takes the torsemide 20 mg, 2 tablets  twice daily  ?

## 2021-12-11 NOTE — Telephone Encounter (Signed)
Pt is aware of note and verbalized understanding. She also stated that she has paperwork and is wanting know if it was done  ?

## 2021-12-11 NOTE — Telephone Encounter (Signed)
Patient is needing an update on paperwork. ?

## 2021-12-12 ENCOUNTER — Other Ambulatory Visit: Payer: Self-pay

## 2021-12-12 DIAGNOSIS — R6889 Other general symptoms and signs: Secondary | ICD-10-CM | POA: Diagnosis not present

## 2021-12-12 NOTE — Telephone Encounter (Signed)
Do you mind completing the form and I can sign? Thanks. ?

## 2021-12-12 NOTE — Telephone Encounter (Signed)
Call placed to patient's daughter, Kevan Ny, regarding the paperwork for CAP. The date on the letter is 10/28/2021 and the documentation states that the information from provider needs to be submitted within 15 business days from the date on the letter.  I explained that this is now 6 weeks since the letter was dated. Ndeya said that she called CAP and the documents can still be submitted. She said that the date on the letter was when the referral was placed from the hospital.  The patient just received the letter recently.  ?

## 2021-12-13 DIAGNOSIS — Z992 Dependence on renal dialysis: Secondary | ICD-10-CM | POA: Diagnosis not present

## 2021-12-13 DIAGNOSIS — N186 End stage renal disease: Secondary | ICD-10-CM | POA: Diagnosis not present

## 2021-12-13 DIAGNOSIS — N2581 Secondary hyperparathyroidism of renal origin: Secondary | ICD-10-CM | POA: Diagnosis not present

## 2021-12-16 ENCOUNTER — Encounter (HOSPITAL_COMMUNITY): Payer: Self-pay

## 2021-12-16 ENCOUNTER — Other Ambulatory Visit (HOSPITAL_COMMUNITY): Payer: Self-pay

## 2021-12-16 DIAGNOSIS — Z992 Dependence on renal dialysis: Secondary | ICD-10-CM | POA: Diagnosis not present

## 2021-12-16 DIAGNOSIS — N2581 Secondary hyperparathyroidism of renal origin: Secondary | ICD-10-CM | POA: Diagnosis not present

## 2021-12-16 DIAGNOSIS — N186 End stage renal disease: Secondary | ICD-10-CM | POA: Diagnosis not present

## 2021-12-16 MED ORDER — ETHYL CHLORIDE EX AERO
INHALATION_SPRAY | CUTANEOUS | 0 refills | Status: DC
  Filled 2021-12-16 – 2021-12-17 (×2): qty 116, 30d supply, fill #0

## 2021-12-16 NOTE — Telephone Encounter (Signed)
Completed CAP application faxed to [facility] - CAP referral. # [phone] ?

## 2021-12-17 ENCOUNTER — Other Ambulatory Visit (HOSPITAL_COMMUNITY): Payer: Self-pay

## 2021-12-17 ENCOUNTER — Other Ambulatory Visit: Payer: Self-pay

## 2021-12-17 ENCOUNTER — Encounter (HOSPITAL_COMMUNITY): Payer: Self-pay

## 2021-12-17 DIAGNOSIS — Z992 Dependence on renal dialysis: Secondary | ICD-10-CM | POA: Diagnosis not present

## 2021-12-17 DIAGNOSIS — N186 End stage renal disease: Secondary | ICD-10-CM | POA: Diagnosis not present

## 2021-12-17 DIAGNOSIS — N2581 Secondary hyperparathyroidism of renal origin: Secondary | ICD-10-CM | POA: Diagnosis not present

## 2021-12-18 ENCOUNTER — Other Ambulatory Visit (HOSPITAL_COMMUNITY): Payer: Self-pay

## 2021-12-19 DIAGNOSIS — N186 End stage renal disease: Secondary | ICD-10-CM | POA: Diagnosis not present

## 2021-12-19 DIAGNOSIS — N2581 Secondary hyperparathyroidism of renal origin: Secondary | ICD-10-CM | POA: Diagnosis not present

## 2021-12-19 DIAGNOSIS — Z992 Dependence on renal dialysis: Secondary | ICD-10-CM | POA: Diagnosis not present

## 2021-12-20 DIAGNOSIS — Z992 Dependence on renal dialysis: Secondary | ICD-10-CM | POA: Diagnosis not present

## 2021-12-20 DIAGNOSIS — N186 End stage renal disease: Secondary | ICD-10-CM | POA: Diagnosis not present

## 2021-12-20 DIAGNOSIS — N2581 Secondary hyperparathyroidism of renal origin: Secondary | ICD-10-CM | POA: Diagnosis not present

## 2021-12-23 ENCOUNTER — Encounter (HOSPITAL_COMMUNITY): Payer: Self-pay

## 2021-12-23 ENCOUNTER — Emergency Department (HOSPITAL_COMMUNITY): Payer: Medicare HMO

## 2021-12-23 ENCOUNTER — Telehealth: Payer: Medicare HMO | Admitting: Physician Assistant

## 2021-12-23 ENCOUNTER — Emergency Department (HOSPITAL_COMMUNITY)
Admission: EM | Admit: 2021-12-23 | Discharge: 2021-12-23 | Disposition: A | Discharge status: Home / Self Care | Payer: Medicare HMO | Attending: Emergency Medicine | Admitting: Emergency Medicine

## 2021-12-23 ENCOUNTER — Other Ambulatory Visit: Payer: Self-pay

## 2021-12-23 ENCOUNTER — Other Ambulatory Visit (HOSPITAL_COMMUNITY): Payer: Self-pay

## 2021-12-23 ENCOUNTER — Ambulatory Visit: Payer: Medicare HMO | Admitting: Dietician

## 2021-12-23 ENCOUNTER — Encounter: Payer: Medicare HMO | Admitting: Dietician

## 2021-12-23 DIAGNOSIS — Z79899 Other long term (current) drug therapy: Secondary | ICD-10-CM | POA: Diagnosis not present

## 2021-12-23 DIAGNOSIS — R0602 Shortness of breath: Secondary | ICD-10-CM

## 2021-12-23 DIAGNOSIS — R7989 Other specified abnormal findings of blood chemistry: Secondary | ICD-10-CM | POA: Insufficient documentation

## 2021-12-23 DIAGNOSIS — J810 Acute pulmonary edema: Secondary | ICD-10-CM | POA: Insufficient documentation

## 2021-12-23 DIAGNOSIS — I132 Hypertensive heart and chronic kidney disease with heart failure and with stage 5 chronic kidney disease, or end stage renal disease: Secondary | ICD-10-CM | POA: Insufficient documentation

## 2021-12-23 DIAGNOSIS — Z7951 Long term (current) use of inhaled steroids: Secondary | ICD-10-CM | POA: Diagnosis not present

## 2021-12-23 DIAGNOSIS — Z7982 Long term (current) use of aspirin: Secondary | ICD-10-CM | POA: Insufficient documentation

## 2021-12-23 DIAGNOSIS — N186 End stage renal disease: Secondary | ICD-10-CM | POA: Diagnosis not present

## 2021-12-23 DIAGNOSIS — I480 Paroxysmal atrial fibrillation: Secondary | ICD-10-CM | POA: Diagnosis not present

## 2021-12-23 DIAGNOSIS — E1122 Type 2 diabetes mellitus with diabetic chronic kidney disease: Secondary | ICD-10-CM | POA: Insufficient documentation

## 2021-12-23 DIAGNOSIS — Z7901 Long term (current) use of anticoagulants: Secondary | ICD-10-CM | POA: Insufficient documentation

## 2021-12-23 DIAGNOSIS — Z992 Dependence on renal dialysis: Secondary | ICD-10-CM | POA: Diagnosis not present

## 2021-12-23 DIAGNOSIS — J4521 Mild intermittent asthma with (acute) exacerbation: Secondary | ICD-10-CM | POA: Insufficient documentation

## 2021-12-23 DIAGNOSIS — N2581 Secondary hyperparathyroidism of renal origin: Secondary | ICD-10-CM | POA: Diagnosis not present

## 2021-12-23 DIAGNOSIS — R079 Chest pain, unspecified: Secondary | ICD-10-CM | POA: Diagnosis not present

## 2021-12-23 DIAGNOSIS — I5032 Chronic diastolic (congestive) heart failure: Secondary | ICD-10-CM | POA: Insufficient documentation

## 2021-12-23 LAB — CBC WITH DIFFERENTIAL/PLATELET
Abs Immature Granulocytes: 0.04 K/uL (ref 0.00–0.07)
Basophils Absolute: 0 K/uL (ref 0.0–0.1)
Basophils Relative: 0 %
Eosinophils Absolute: 0.2 K/uL (ref 0.0–0.5)
Eosinophils Relative: 3 %
HCT: 26.6 % — ABNORMAL LOW (ref 36.0–46.0)
Hemoglobin: 9.4 g/dL — ABNORMAL LOW (ref 12.0–15.0)
Immature Granulocytes: 1 %
Lymphocytes Relative: 9 %
Lymphs Abs: 0.6 K/uL — ABNORMAL LOW (ref 0.7–4.0)
MCH: 30.2 pg (ref 26.0–34.0)
MCHC: 35.3 g/dL (ref 30.0–36.0)
MCV: 85.5 fL (ref 80.0–100.0)
Monocytes Absolute: 0.5 K/uL (ref 0.1–1.0)
Monocytes Relative: 8 %
Neutro Abs: 5.2 K/uL (ref 1.7–7.7)
Neutrophils Relative %: 79 %
Platelets: 145 K/uL — ABNORMAL LOW (ref 150–400)
RBC: 3.11 MIL/uL — ABNORMAL LOW (ref 3.87–5.11)
RDW: 14.4 % (ref 11.5–15.5)
WBC: 6.5 K/uL (ref 4.0–10.5)
nRBC: 0 % (ref 0.0–0.2)

## 2021-12-23 LAB — COMPREHENSIVE METABOLIC PANEL
ALT: 21 U/L (ref 0–44)
AST: 22 U/L (ref 15–41)
Albumin: 3.7 g/dL (ref 3.5–5.0)
Alkaline Phosphatase: 108 U/L (ref 38–126)
Anion gap: 9 (ref 5–15)
BUN: 21 mg/dL (ref 8–23)
CO2: 28 mmol/L (ref 22–32)
Calcium: 8.5 mg/dL — ABNORMAL LOW (ref 8.9–10.3)
Chloride: 96 mmol/L — ABNORMAL LOW (ref 98–111)
Creatinine, Ser: 3.38 mg/dL — ABNORMAL HIGH (ref 0.44–1.00)
GFR, Estimated: 14 mL/min — ABNORMAL LOW (ref >60)
Glucose, Bld: 224 mg/dL — ABNORMAL HIGH (ref 70–99)
Potassium: 3.5 mmol/L (ref 3.5–5.1)
Sodium: 133 mmol/L — ABNORMAL LOW (ref 135–145)
Total Bilirubin: 1.2 mg/dL (ref 0.3–1.2)
Total Protein: 7.1 g/dL (ref 6.5–8.1)

## 2021-12-23 LAB — TROPONIN I (HIGH SENSITIVITY)
Troponin I (High Sensitivity): 20 ng/L — ABNORMAL HIGH (ref <18)
Troponin I (High Sensitivity): 21 ng/L — ABNORMAL HIGH (ref <18)

## 2021-12-23 LAB — BRAIN NATRIURETIC PEPTIDE: B Natriuretic Peptide: 746 pg/mL — ABNORMAL HIGH (ref 0.0–100.0)

## 2021-12-23 MED ORDER — CARVEDILOL 12.5 MG PO TABS: 12.5000 mg | ORAL_TABLET | Freq: Two times a day (BID) | ORAL | Status: DC

## 2021-12-23 MED ORDER — AMLODIPINE BESYLATE 5 MG PO TABS
5.0000 mg | ORAL_TABLET | Freq: Every day | ORAL | Status: DC
  Administered 2021-12-23: 5 mg via ORAL
  Filled 2021-12-23: qty 1

## 2021-12-23 MED ORDER — APIXABAN 5 MG PO TABS
5.0000 mg | ORAL_TABLET | Freq: Two times a day (BID) | ORAL | Status: DC
  Administered 2021-12-23: 5 mg via ORAL
  Filled 2021-12-23: qty 1

## 2021-12-23 MED ORDER — FUROSEMIDE 10 MG/ML IJ SOLN
40.0000 mg | Freq: Once | INTRAMUSCULAR | Status: AC
  Administered 2021-12-23: 40 mg via INTRAVENOUS
  Filled 2021-12-23: qty 4

## 2021-12-23 MED ORDER — METHYLPREDNISOLONE SODIUM SUCC 125 MG IJ SOLR
125.0000 mg | Freq: Once | INTRAMUSCULAR | Status: AC
  Administered 2021-12-23: 125 mg via INTRAVENOUS
  Filled 2021-12-23: qty 2

## 2021-12-23 MED ORDER — ALBUTEROL SULFATE (2.5 MG/3ML) 0.083% IN NEBU
5.0000 mg | INHALATION_SOLUTION | Freq: Once | RESPIRATORY_TRACT | Status: AC
  Administered 2021-12-23: 5 mg via RESPIRATORY_TRACT
  Filled 2021-12-23: qty 6

## 2021-12-23 MED ORDER — ALBUTEROL SULFATE (5 MG/ML) 0.5% IN NEBU
2.5000 mg | INHALATION_SOLUTION | Freq: Four times a day (QID) | RESPIRATORY_TRACT | 12 refills | Status: DC | PRN
  Filled 2021-12-23 – 2021-12-24 (×2): qty 20, 10d supply, fill #0

## 2021-12-23 MED ORDER — APIXABAN 5 MG PO TABS
5.0000 mg | ORAL_TABLET | Freq: Two times a day (BID) | ORAL | 0 refills | Status: DC
  Filled 2021-12-23 – 2021-12-24 (×2): qty 60, 30d supply, fill #0

## 2021-12-23 MED ORDER — PREDNISONE 10 MG PO TABS
20.0000 mg | ORAL_TABLET | Freq: Every day | ORAL | 0 refills | Status: AC
  Filled 2021-12-23 – 2021-12-24 (×2): qty 8, 4d supply, fill #0

## 2021-12-23 NOTE — Progress Notes (Signed)
Based on what you shared with me, I feel your condition warrants further evaluation and I recommend that you be seen in a face to face visit. ? ?You need to be seen by your PCP or at Urgent Care first thing this morning for repeat lung examination and further evaluation to determine best treatments. If difficulty breathing is severe, you need ER evaluation.  ?  ?NOTE: There will be NO CHARGE for this eVisit ?  ?If you are having a true medical emergency please call 911.   ?  ? For an urgent face to face visit, [facility] has six urgent care centers for your convenience:  ?  ? [facility] [place] ?Get Driving Directions ?[phone] ?[address] ?[place], [place] [address] ?  ? [facility]) ?Get Driving Directions ?[phone] ?[address] ?[place], [place] [address] ? ?[facility]) ?Get Driving Directions ?[address] [place],  [place]  [address] ? ?[facility] ?Get Driving Directions ?[phone] ?[address] ?[place], [place] [address] ?  ?[facility] ?Get Driving Directions  ?[phone] ?[address] ?[address] ?[place], [place] [address] ?  ? Urgent Care at [facility] ?Get Driving Directions ?[phone] ?[address] ?[place], [place] [address] ? ?Your MyChart E-visit questionnaire answers were reviewed by a board certified advanced clinical practitioner to complete your personal care plan based on your specific symptoms.  Thank you for using e-Visits. ?  ? ?

## 2021-12-23 NOTE — ED Notes (Signed)
Called pts daughter and updated  ?

## 2021-12-23 NOTE — ED Notes (Signed)
PT decided to leave AMA ?

## 2021-12-23 NOTE — ED Notes (Signed)
Patient transported to X-ray 

## 2021-12-23 NOTE — ED Provider Notes (Signed)
?[facility] ?Provider Note ? ? ?CSN: [id number] ?Arrival date & time: 12/23/21  1508 ? ?  ? ?History ? ?Chief Complaint  ?Patient presents with  ? Shortness of Breath  ? ? ?Paula [name] is a 76 y.o. female. ? ? ?Shortness of Breath ?Associated symptoms: cough and wheezing   ?Patient presenting for shortness of breath.  Medical history includes T2DM, HLD, HTN, GERD, ESRD, neuropathy, CHF, gout, and asthma.  She started on dialysis 1 week ago.  She is currently scheduled for 4 days/week.  3 days ago, she began to feel short of breath.  Symptoms are consistent with prior episodes of asthma exacerbation.  She has been using an albuterol inhaler at home.  This morning at 5 AM, EMS was called to her home.  She got 1 DuoNeb breathing treatment with significant relief of her symptoms.  Because she had not gone to dialysis over the weekend, she went to her dialysis session today.  Following her dialysis session, she continued to feel short of breath and was brought to the emergency department.  Patient endorses recent cough with production of clear sputum.  She denies any areas of pain.  She had a brief episode of palpitations earlier today. ?  ? ?Home Medications ?Prior to Admission medications   ?Medication Sig Start Date End Date Taking? Authorizing Provider  ?albuterol (PROVENTIL) (5 MG/ML) 0.5% nebulizer solution Take 0.5 mLs (2.5 mg total) by nebulization every 6 (six) hours as needed for wheezing or shortness of breath. 12/23/21  Yes Godfrey Pick, MD  ?apixaban (ELIQUIS) 5 MG TABS tablet Take 1 tablet (5 mg total) by mouth 2 (two) times daily. 12/23/21 01/22/22 Yes Godfrey Pick, MD  ?predniSONE (DELTASONE) 10 MG tablet Take 2 tablets (20 mg total) by mouth daily for 4 days. 12/23/21 12/27/21 Yes Godfrey Pick, MD  ?acetaminophen (TYLENOL) 500 MG tablet Take 500 mg by mouth every 6 (six) hours as needed for fever, moderate pain or mild pain.    [provider]  ?albuterol  (PROAIR HFA) 108 (90 Base) MCG/ACT inhaler Inhale 1-2 puffs into the lungs every 6 (six) hours as needed for wheezing or shortness of breath. ?Patient taking differently: Inhale 2 puffs into the lungs every 6 (six) hours as needed for wheezing or shortness of breath. 10/01/21   Charlott Rakes, MD  ?amLODipine (NORVASC) 5 MG tablet Take 1 tablet (5 mg total) by mouth daily. 11/13/21   Hendricks Limes, PA-C  ?aspirin EC 81 MG tablet Take 81 mg by mouth every evening.    [provider]  ?atorvastatin (LIPITOR) 80 MG tablet Take 1 tablet (80 mg total) by mouth daily. 10/01/21   Charlott Rakes, MD  ?calcium acetate (PHOSLO) 667 MG capsule Take 1 capsule by mouth 3 times daily 10/31/21   Penninger, Ria Comment, PA  ?carvedilol (COREG) 12.5 MG tablet Take 1 tablet (12.5 mg total) by mouth 2 (two) times daily with a meal. 10/01/21   Charlott Rakes, MD  ?dextromethorphan (DELSYM) 30 MG/5ML liquid Take 30 mg by mouth daily as needed for cough. ?Patient not taking: Reported on 11/15/2021    [provider]  ?ethyl chloride spray Apply a small amount on skin immediately before cannulation 12/16/21   Elmarie Shiley, MD  ?fluticasone (FLONASE) 50 MCG/ACT nasal spray Place 2 sprays into both nostrils daily. ?Patient not taking: Reported on 11/13/2021 03/15/21   Collene Gobble, MD  ?gabapentin (NEURONTIN) 100 MG capsule Take 100 mg by mouth 3 (three)  times daily. 09/05/21   [provider]  ?glipiZIDE (GLUCOTROL XL) 2.5 MG 24 hr tablet Take 1 tablet (2.5 mg total) by mouth daily with breakfast. ?Patient taking differently: Take 1.25 mg by mouth daily with breakfast. 10/31/21   Charlott Rakes, MD  ?glucose blood (ACCU-CHEK GUIDE) test strip Use as instructed once daily 10/25/21   Charlott Rakes, MD  ?guaiFENesin-dextromethorphan (ROBITUSSIN DM) 100-10 MG/5ML syrup Take 10 mLs by mouth every 4 (four) hours as needed for cough. ?Patient not taking: Reported on 11/13/2021 09/05/21   [name], MD  ?Homeopathic Products  (EARACHE DROPS OT) Place 1 drop into both ears daily.    [provider]  ?isosorbide mononitrate (IMDUR) 60 MG 24 hr tablet TAKE 1 TABLET (60 MG TOTAL) BY MOUTH DAILY. ?Patient taking differently: Take 60 mg by mouth daily. 10/01/21 10/01/22  Charlott Rakes, MD  ?lidocaine-prilocaine (EMLA) cream Apply a small amount to dialysis access one hour prior to dialysis. Wrap with plastic wrap. 12/10/21     ?loratadine (CLARITIN) 10 MG tablet Take 1 tablet (10 mg total) by mouth daily. 03/15/21   Collene Gobble, MD  ?Misc. Devices [employer].  Diagnosis - unstable gait 03/05/21   Charlott Rakes, MD  ?Polyethyl Glycol-Propyl Glycol (SYSTANE OP) Place 1 drop into both eyes daily.    [provider]  ?torsemide (DEMADEX) 20 MG tablet Take 2 tablets by mouth 2 times daily 10/18/21   Edrick Oh, MD  ?Torsemide 40 MG TABS Take 80 mg by mouth 2 (two) times daily. ?Patient not taking: Reported on 11/13/2021 10/18/21   [name], MD  ?sodium chloride (OCEAN) 0.65 % SOLN nasal spray Place 2 sprays into both nostrils as needed for congestion. ?Patient not taking: No sig reported 03/21/19 02/21/21  Martyn Ehrich, NP  ?   ? ?Allergies    ?Dust mite extract, Ace inhibitors, and Lovastatin   ? ?Review of Systems   ?Review of Systems  ?Respiratory:  Positive for cough, chest tightness, shortness of breath and wheezing.   ?Cardiovascular:  Positive for palpitations.  ?All other systems reviewed and are negative. ? ?Physical Exam ?Updated Vital Signs ?BP (!) 178/64   Pulse 72   Temp 98.2 ?F (36.8 ?C) (Oral)   Resp 20   Ht 5\' 2"  (1.575 m)   Wt 68.9 kg   LMP  (LMP Unknown)   SpO2 98%   BMI 27.80 kg/m?  ?Physical Exam ?Vitals and nursing note reviewed.  ?Constitutional:   ?   General: She is not in acute distress. ?   Appearance: She is well-developed. She is not ill-appearing, toxic-appearing or diaphoretic.  ?HENT:  ?   Head: Normocephalic and atraumatic.  ?   Mouth/Throat:  ?   Mouth: Mucous membranes are moist.   ?   Pharynx: Oropharynx is clear.  ?Eyes:  ?   Extraocular Movements: Extraocular movements intact.  ?   Conjunctiva/sclera: Conjunctivae normal.  ?   Pupils: Pupils are equal, round, and reactive to light.  ?Cardiovascular:  ?   Rate and Rhythm: Normal rate and regular rhythm.  ?   Heart sounds: No murmur heard. ?Pulmonary:  ?   Effort: Pulmonary effort is normal. No tachypnea, accessory muscle usage or respiratory distress.  ?   Breath sounds: Wheezing present. No rhonchi or rales.  ?Chest:  ?   Chest wall: No tenderness or edema.  ?Abdominal:  ?   Palpations: Abdomen is soft.  ?   Tenderness: There is no abdominal tenderness.  ?  Musculoskeletal:     ?   General: No swelling. Normal range of motion.  ?   Cervical back: Normal range of motion and neck supple.  ?   Right lower leg: No edema.  ?   Left lower leg: No edema.  ?Skin: ?   General: Skin is warm and dry.  ?   Capillary Refill: Capillary refill takes less than 2 seconds.  ?   Coloration: Skin is not cyanotic or pale.  ?Neurological:  ?   General: No focal deficit present.  ?   Mental Status: She is alert and oriented to person, place, and time.  ?   Cranial Nerves: No cranial nerve deficit.  ?   Motor: No weakness.  ?Psychiatric:     ?   Mood and Affect: Mood normal.     ?   Behavior: Behavior normal.  ? ? ?ED Results / Procedures / Treatments   ?Labs ?(all labs ordered are listed, but only abnormal results are displayed) ?Labs Reviewed  ?COMPREHENSIVE METABOLIC PANEL - Abnormal; Notable for the following components:  ?    Result Value  ? Sodium 133 (*)   ? Chloride 96 (*)   ? Glucose, Bld 224 (*)   ? Creatinine, Ser 3.38 (*)   ? Calcium 8.5 (*)   ? GFR, Estimated 14 (*)   ? All other components within normal limits  ?CBC WITH DIFFERENTIAL/PLATELET - Abnormal; Notable for the following components:  ? RBC 3.11 (*)   ? Hemoglobin 9.4 (*)   ? HCT 26.6 (*)   ? Platelets 145 (*)   ? Lymphs Abs 0.6 (*)   ? All other components within normal limits  ?BRAIN  NATRIURETIC PEPTIDE - Abnormal; Notable for the following components:  ? B Natriuretic Peptide 746.0 (*)   ? All other components within normal limits  ?TROPONIN I (HIGH SENSITIVITY) - Abnormal; Notable for the following co

## 2021-12-23 NOTE — Discharge Instructions (Addendum)
I emailed your kidney doctor, Vanetta Mulders, about your visit today.  Please follow-up with her and talk to the people at dialysis tomorrow about removing fluid to help with your breathing. ? ?You had intermittent atrial fibrillation while in the emergency department.  You were started on a medicine to lower your stroke risk from A-fib.  A prescription for this medication was sent to your pharmacy.  You will need to follow-up with your regular doctor for refills.  Information about this medication is below.  Please follow-up with the atrial fibrillation clinic regarding this new medication. ? ?You received breathing treatment and steroids in the emergency department to help with your breathing.  There is a prescription for continued steroids for the next 4 days that will also help with your asthma.  Prescriptions for nebulized breathing treatment also sent. ? ?Please return to the emergency department at any time if you experience worsening symptoms. ? ?Information on my medicine - ELIQUIS? (apixaban) ? ?This medication education was reviewed with me or my healthcare representative as part of my discharge preparation. ? ?Why was Eliquis? prescribed for you? ?Eliquis? was prescribed for you to reduce the risk of forming blood clots that can cause a stroke if you have a medical condition called atrial fibrillation (a type of irregular heartbeat) OR to reduce the risk of a blood clots forming after orthopedic surgery. ? ?What do You need to know about Eliquis? ? ?Take your Eliquis? TWICE DAILY - one tablet in the morning and one tablet in the evening with or without food.  It would be best to take the doses about the same time each day. ? ?If you have difficulty swallowing the tablet whole please discuss with your pharmacist how to take the medication safely. ? ?Take Eliquis? exactly as prescribed by your doctor and DO NOT stop taking Eliquis? without talking to the doctor who prescribed the medication.  Stopping  may increase your risk of developing a new clot or stroke.  Refill your prescription before you run out. ? ?After discharge, you should have regular check-up appointments with your healthcare provider that is prescribing your Eliquis?.  In the future your dose may need to be changed if your kidney function or weight changes by a significant amount or as you get older. ? ?What do you do if you miss a dose? ?If you miss a dose, take it as soon as you remember on the same day and resume taking twice daily.  Do not take more than one dose of ELIQUIS at the same time. ? ?Important Safety Information ?A possible side effect of Eliquis? is bleeding. You should call your healthcare provider right away if you experience any of the following: ?Bleeding from an injury or your nose that does not stop. ?Unusual colored urine (red or dark brown) or unusual colored stools (red or black). ?Unusual bruising for unknown reasons. ?A serious fall or if you hit your head (even if there is no bleeding). ? ?Some medicines may interact with Eliquis? and might increase your risk of bleeding or clotting while on Eliquis?Marland Kitchen To help avoid this, consult your healthcare provider or pharmacist prior to using any new prescription or non-prescription medications, including herbals, vitamins, non-steroidal anti-inflammatory drugs (NSAIDs) and supplements. ? ?This website has more information on Eliquis? (apixaban): http://www.eliquis.com/eliquis/home ?  ?

## 2021-12-23 NOTE — ED Triage Notes (Signed)
Pt arrived POV from dialysis c/o [facility] that started this morning. Pt endorses wheezing and coughing x3 days. Pt went to dialysis hoping it would help but it did not.  ?

## 2021-12-23 NOTE — ED Provider Triage Note (Signed)
Emergency Medicine Provider Triage Evaluation Note ? ?Paula [name] , a 76 y.o. female  was evaluated in triage.  Pt complains of shortness of breath, wheezing and left-sided chest pain.  Symptoms began when she woke up this morning.  Had a nebulizer treatment done by EMS and went to dialysis with some improvement but then her symptoms got worse.  This is her second week of dialysis.  Denies any leg swelling or hemoptysis. ? ?Review of Systems  ?Positive: Chest pain, wheezing, shortness of breath ?Negative: Leg swelling ? ?Physical Exam  ?LMP  (LMP Unknown)  ?Gen:   Awake, no distress   ?Resp:  Normal effort  ?MSK:   Moves extremities without difficulty  ?Other:  No wheezing on my exam, speaking complete sentences but cough is noted ? ?Medical Decision Making  ?Medically screening exam initiated at 4:09 PM.  Appropriate orders placed.  Paula [name] was informed that the remainder of the evaluation will be completed by another provider, this initial triage assessment does not replace that evaluation, and the importance of remaining in the ED until their evaluation is complete. ? ?Work-up initiated ?  ?Paula Heady, PA-C ?12/23/21 1611 ? ?

## 2021-12-24 ENCOUNTER — Telehealth (HOSPITAL_COMMUNITY): Payer: Self-pay

## 2021-12-24 ENCOUNTER — Other Ambulatory Visit: Payer: Self-pay | Admitting: Emergency Medicine

## 2021-12-24 ENCOUNTER — Other Ambulatory Visit (HOSPITAL_COMMUNITY): Payer: Self-pay

## 2021-12-24 ENCOUNTER — Other Ambulatory Visit: Payer: Self-pay

## 2021-12-24 DIAGNOSIS — Z992 Dependence on renal dialysis: Secondary | ICD-10-CM | POA: Diagnosis not present

## 2021-12-24 DIAGNOSIS — N2581 Secondary hyperparathyroidism of renal origin: Secondary | ICD-10-CM | POA: Diagnosis not present

## 2021-12-24 DIAGNOSIS — N186 End stage renal disease: Secondary | ICD-10-CM | POA: Diagnosis not present

## 2021-12-24 NOTE — Telephone Encounter (Signed)
Reached out to patient to schedule ED follow up appointment to be seen at the A-fib clinic. Left message for patient to call back. ?

## 2021-12-25 ENCOUNTER — Other Ambulatory Visit: Payer: Self-pay | Admitting: Family Medicine

## 2021-12-25 ENCOUNTER — Other Ambulatory Visit: Payer: Self-pay

## 2021-12-25 ENCOUNTER — Other Ambulatory Visit (HOSPITAL_COMMUNITY): Payer: Self-pay

## 2021-12-25 MED ORDER — ALBUTEROL SULFATE HFA 108 (90 BASE) MCG/ACT IN AERS
1.0000 | INHALATION_SPRAY | Freq: Four times a day (QID) | RESPIRATORY_TRACT | 0 refills | Status: DC | PRN
  Filled 2021-12-25: qty 18, 25d supply, fill #0

## 2021-12-26 ENCOUNTER — Other Ambulatory Visit (HOSPITAL_COMMUNITY): Payer: Self-pay

## 2021-12-26 DIAGNOSIS — N2581 Secondary hyperparathyroidism of renal origin: Secondary | ICD-10-CM | POA: Diagnosis not present

## 2021-12-26 DIAGNOSIS — N186 End stage renal disease: Secondary | ICD-10-CM | POA: Diagnosis not present

## 2021-12-26 DIAGNOSIS — Z992 Dependence on renal dialysis: Secondary | ICD-10-CM | POA: Diagnosis not present

## 2021-12-27 ENCOUNTER — Encounter (HOSPITAL_COMMUNITY): Payer: Medicare HMO

## 2021-12-27 DIAGNOSIS — Z992 Dependence on renal dialysis: Secondary | ICD-10-CM | POA: Diagnosis not present

## 2021-12-27 DIAGNOSIS — N2581 Secondary hyperparathyroidism of renal origin: Secondary | ICD-10-CM | POA: Diagnosis not present

## 2021-12-27 DIAGNOSIS — N186 End stage renal disease: Secondary | ICD-10-CM | POA: Diagnosis not present

## 2021-12-29 ENCOUNTER — Emergency Department (HOSPITAL_COMMUNITY): Payer: Medicare HMO

## 2021-12-29 ENCOUNTER — Emergency Department (HOSPITAL_COMMUNITY)
Admission: EM | Admit: 2021-12-29 | Discharge: 2021-12-29 | Disposition: A | Discharge status: Home / Self Care | Payer: Medicare HMO | Attending: Emergency Medicine | Admitting: Emergency Medicine

## 2021-12-29 DIAGNOSIS — H81399 Other peripheral vertigo, unspecified ear: Secondary | ICD-10-CM | POA: Diagnosis not present

## 2021-12-29 DIAGNOSIS — I10 Essential (primary) hypertension: Secondary | ICD-10-CM | POA: Diagnosis not present

## 2021-12-29 DIAGNOSIS — Z7984 Long term (current) use of oral hypoglycemic drugs: Secondary | ICD-10-CM | POA: Insufficient documentation

## 2021-12-29 DIAGNOSIS — R42 Dizziness and giddiness: Secondary | ICD-10-CM | POA: Diagnosis not present

## 2021-12-29 DIAGNOSIS — Z7901 Long term (current) use of anticoagulants: Secondary | ICD-10-CM | POA: Insufficient documentation

## 2021-12-29 DIAGNOSIS — N186 End stage renal disease: Secondary | ICD-10-CM | POA: Insufficient documentation

## 2021-12-29 DIAGNOSIS — I5032 Chronic diastolic (congestive) heart failure: Secondary | ICD-10-CM | POA: Insufficient documentation

## 2021-12-29 DIAGNOSIS — E1122 Type 2 diabetes mellitus with diabetic chronic kidney disease: Secondary | ICD-10-CM | POA: Diagnosis not present

## 2021-12-29 DIAGNOSIS — R030 Elevated blood-pressure reading, without diagnosis of hypertension: Secondary | ICD-10-CM

## 2021-12-29 DIAGNOSIS — E1165 Type 2 diabetes mellitus with hyperglycemia: Secondary | ICD-10-CM | POA: Diagnosis not present

## 2021-12-29 DIAGNOSIS — Z79899 Other long term (current) drug therapy: Secondary | ICD-10-CM | POA: Insufficient documentation

## 2021-12-29 DIAGNOSIS — R739 Hyperglycemia, unspecified: Secondary | ICD-10-CM | POA: Diagnosis not present

## 2021-12-29 DIAGNOSIS — Z7982 Long term (current) use of aspirin: Secondary | ICD-10-CM | POA: Diagnosis not present

## 2021-12-29 DIAGNOSIS — Z20822 Contact with and (suspected) exposure to covid-19: Secondary | ICD-10-CM | POA: Insufficient documentation

## 2021-12-29 DIAGNOSIS — R059 Cough, unspecified: Secondary | ICD-10-CM | POA: Diagnosis not present

## 2021-12-29 DIAGNOSIS — I132 Hypertensive heart and chronic kidney disease with heart failure and with stage 5 chronic kidney disease, or end stage renal disease: Secondary | ICD-10-CM | POA: Diagnosis not present

## 2021-12-29 DIAGNOSIS — E1140 Type 2 diabetes mellitus with diabetic neuropathy, unspecified: Secondary | ICD-10-CM | POA: Insufficient documentation

## 2021-12-29 LAB — BASIC METABOLIC PANEL
Anion gap: 10 (ref 5–15)
BUN: 50 mg/dL — ABNORMAL HIGH (ref 8–23)
CO2: 26 mmol/L (ref 22–32)
Calcium: 8.4 mg/dL — ABNORMAL LOW (ref 8.9–10.3)
Chloride: 94 mmol/L — ABNORMAL LOW (ref 98–111)
Creatinine, Ser: 5.29 mg/dL — ABNORMAL HIGH (ref 0.44–1.00)
GFR, Estimated: 8 mL/min — ABNORMAL LOW (ref >60)
Glucose, Bld: 292 mg/dL — ABNORMAL HIGH (ref 70–99)
Potassium: 3.8 mmol/L (ref 3.5–5.1)
Sodium: 130 mmol/L — ABNORMAL LOW (ref 135–145)

## 2021-12-29 LAB — CBC
HCT: 27.2 % — ABNORMAL LOW (ref 36.0–46.0)
Hemoglobin: 9.6 g/dL — ABNORMAL LOW (ref 12.0–15.0)
MCH: 31.3 pg (ref 26.0–34.0)
MCHC: 35.3 g/dL (ref 30.0–36.0)
MCV: 88.6 fL (ref 80.0–100.0)
Platelets: 195 10*3/uL (ref 150–400)
RBC: 3.07 MIL/uL — ABNORMAL LOW (ref 3.87–5.11)
RDW: 17.2 % — ABNORMAL HIGH (ref 11.5–15.5)
WBC: 9.9 10*3/uL (ref 4.0–10.5)
nRBC: 0 % (ref 0.0–0.2)

## 2021-12-29 LAB — RESP PANEL BY RT-PCR (FLU A&B, COVID) ARPGX2
Influenza A by PCR: NEGATIVE
Influenza B by PCR: NEGATIVE
SARS Coronavirus 2 by RT PCR: NEGATIVE

## 2021-12-29 LAB — CBG MONITORING, ED: Glucose-Capillary: 272 mg/dL — ABNORMAL HIGH (ref 70–99)

## 2021-12-29 MED ORDER — MECLIZINE HCL 12.5 MG PO TABS
12.5000 mg | ORAL_TABLET | Freq: Two times a day (BID) | ORAL | 0 refills | Status: DC | PRN
  Filled 2021-12-29: qty 30, 15d supply, fill #0

## 2021-12-29 MED ORDER — MECLIZINE HCL 25 MG PO TABS
12.5000 mg | ORAL_TABLET | Freq: Once | ORAL | Status: AC
  Administered 2021-12-29: 12.5 mg via ORAL
  Filled 2021-12-29: qty 1

## 2021-12-29 MED ORDER — SODIUM CHLORIDE 0.9 % IV BOLUS: 500.0000 mL | Freq: Once | INTRAVENOUS | Status: DC

## 2021-12-29 NOTE — ED Triage Notes (Signed)
Pt via [facility] for eval of dizziness all day today, states she slept most of the day. Dialysis M,T,Th,Fri. Denies additional symptoms  CBG 421 160/72 R arm restriction Stroke screen clear in triage

## 2021-12-29 NOTE — Discharge Instructions (Addendum)
Please follow-up with your primary care doctor regarding elevated blood sugar and elevated blood pressure   It was a pleasure caring for you today in the emergency department.  Please return to the emergency department for any worsening or worrisome symptoms.

## 2021-12-29 NOTE — ED Provider Notes (Signed)
[facility] EMERGENCY DEPARTMENT Provider Note   CSN: [id number] Arrival date & time: 12/29/21  0120     History  Chief Complaint  Patient presents with   Dizziness    Paula [name] is a 76 y.o. female.  Patient as above with significant medical history as below, including ESRD on hemodialysis Monday, Tuesday, Thursday, Friday by: CKD, diastolic CHF, hypertension, hyperlipidemia who presents to the ED with complaint of elevated blood pressure, elevated glucose, dizziness.  Last dialysis session was Friday.  Received full session.  Patient reports that she woke up in the middle of the evening and checked her blood pressure, as her blood pressure was elevated.  She also checked her blood sugar and it was over 500.  Patient has been feeling tired over the past few weeks.  She also was having some dizziness yesterday described as room spinning sensation.  The symptoms have improved from the onset.  Dizziness was sudden onset and worsen with head turning or when she attempted to ambulate.  No numbness, tingling or headache.  No palpitations.  No neck pain.  No nausea or vomiting.  No LOC.  No chest pain or dyspnea.  No abdominal pain nausea or vomiting.  Reports that she is compliant with her home medications.     Past Medical History:  Diagnosis Date   Allergic rhinitis    Arthritis    Asthma    Brain aneurysm    Chronic kidney disease    Cough    Diabetes mellitus    Diastolic CHF, chronic ([place]) 10/11/2011   GERD (gastroesophageal reflux disease)    History of colon polyps 2012   tubular adenoma    Hyperlipidemia    Hypertension    Neuropathy 10/11/2011   PONV (postoperative nausea and vomiting)    one time after lymph node surgery    Past Surgical History:  Procedure Laterality Date   AV FISTULA PLACEMENT Right 10/15/2021   Procedure: ARTERIOVENOUS (AV) FISTULA  WITH PLACEMENT OF GORE-TEX STRETCH  GRAFT (4-21mmx45cm);  Surgeon: Angelia Mould,  MD;  Location: [facility] OR;  Service: Vascular;  Laterality: Right;   CATARACT EXTRACTION     right eye   IR 3D INDEPENDENT WKST  12/14/2017   IR ANGIO INTRA EXTRACRAN SEL COM CAROTID INNOMINATE BILAT MOD SED  09/15/2017   IR ANGIO INTRA EXTRACRAN SEL COM CAROTID INNOMINATE UNI L MOD SED  12/14/2017   IR ANGIO VERTEBRAL SEL VERTEBRAL BILAT MOD SED  09/15/2017   IR RADIOLOGIST EVAL & MGMT  09/10/2017   IR RADIOLOGIST EVAL & MGMT  10/19/2017   lymphatic mass surgery     NASAL TURBINATE REDUCTION     RADIOLOGY WITH ANESTHESIA N/A 12/14/2017   Procedure: RADIOLOGY WITH ANESTHESIA EMBOLIZATION;  Surgeon: Luanne Bras, MD;  Location: [facility];  Service: Radiology;  Laterality: N/A;   VIDEO BRONCHOSCOPY Bilateral 11/18/2017   Procedure: VIDEO BRONCHOSCOPY WITHOUT FLUORO;  Surgeon: Collene Gobble, MD;  Location: [facility]COPY;  Service: Cardiopulmonary;  Laterality: Bilateral;      The history is provided by the patient. No language interpreter was used.  Dizziness Associated symptoms: no chest pain, no headaches, no nausea, no palpitations, no shortness of breath and no vomiting       Home Medications Prior to Admission medications   Medication Sig Start Date End Date Taking? Authorizing Provider  meclizine (ANTIVERT) 12.5 MG tablet Take 1 tablet (12.5 mg total) by mouth 2 (two) times daily as needed for dizziness.  12/29/21  Yes [name] Sparrow, DO  acetaminophen (TYLENOL) 500 MG tablet Take 500 mg by mouth every 6 (six) hours as needed for fever, moderate pain or mild pain.    [provider]  albuterol (PROAIR HFA) 108 (90 Base) MCG/ACT inhaler Inhale 1-2 puffs into the lungs every 6 (six) hours as needed for wheezing or shortness of breath. 12/25/21   Charlott Rakes, MD  albuterol (PROVENTIL) (5 MG/ML) 0.5% nebulizer solution Take 0.5 mLs (2.5 mg total) by nebulization every 6 hours as needed for wheezing or shortness of breath. 12/23/21   Godfrey Pick, MD  amLODipine (NORVASC) 5 MG  tablet Take 1 tablet (5 mg total) by mouth daily. 11/13/21   Hendricks Limes, PA-C  apixaban (ELIQUIS) 5 MG TABS tablet Take 1 tablet by mouth 2 times daily. 12/23/21 01/23/22  Godfrey Pick, MD  aspirin EC 81 MG tablet Take 81 mg by mouth every evening.    [provider]  atorvastatin (LIPITOR) 80 MG tablet Take 1 tablet (80 mg total) by mouth daily. 10/01/21   Charlott Rakes, MD  calcium acetate (PHOSLO) 667 MG capsule Take 1 capsule by mouth 3 times daily 10/31/21   Penninger, Ria Comment, PA  carvedilol (COREG) 12.5 MG tablet Take 1 tablet (12.5 mg total) by mouth 2 (two) times daily with a meal. 10/01/21   Charlott Rakes, MD  dextromethorphan (DELSYM) 30 MG/5ML liquid Take 30 mg by mouth daily as needed for cough. Patient not taking: Reported on 11/15/2021    [provider]  ethyl chloride spray Apply a small amount on skin immediately before cannulation 12/16/21   Elmarie Shiley, MD  fluticasone [facility]) 50 MCG/ACT nasal spray Place 2 sprays into both nostrils daily. Patient not taking: Reported on 11/13/2021 03/15/21   Collene Gobble, MD  gabapentin (NEURONTIN) 100 MG capsule Take 100 mg by mouth 3 (three) times daily. 09/05/21   [provider]  glipiZIDE (GLUCOTROL XL) 2.5 MG 24 hr tablet Take 1 tablet (2.5 mg total) by mouth daily with breakfast. Patient taking differently: Take 1.25 mg by mouth daily with breakfast. 10/31/21   Charlott Rakes, MD  glucose blood (ACCU-CHEK GUIDE) test strip Use as instructed once daily 10/25/21   Charlott Rakes, MD  guaiFENesin-dextromethorphan (ROBITUSSIN DM) 100-10 MG/5ML syrup Take 10 mLs by mouth every 4 (four) hours as needed for cough. Patient not taking: Reported on 11/13/2021 09/05/21   [name], MD  Homeopathic Products (EARACHE DROPS OT) Place 1 drop into both ears daily.    [provider]  isosorbide mononitrate (IMDUR) 60 MG 24 hr tablet TAKE 1 TABLET (60 MG TOTAL) BY MOUTH DAILY. Patient taking differently: Take 60 mg by  mouth daily. 10/01/21 10/01/22  Charlott Rakes, MD  lidocaine-prilocaine (EMLA) cream Apply a small amount to dialysis access one hour prior to dialysis. Wrap with plastic wrap. 12/10/21     loratadine (CLARITIN) 10 MG tablet Take 1 tablet (10 mg total) by mouth daily. 03/15/21   Collene Gobble, MD  Misc. Devices [employer].  Diagnosis - unstable gait 03/05/21   Charlott Rakes, MD  Polyethyl Glycol-Propyl Glycol (SYSTANE OP) Place 1 drop into both eyes daily.    [provider]  torsemide (DEMADEX) 20 MG tablet Take 2 tablets by mouth 2 times daily 10/18/21   Edrick Oh, MD  Torsemide 40 MG TABS Take 80 mg by mouth 2 (two) times daily. Patient not taking: Reported on 11/13/2021 10/18/21   [name], MD  sodium chloride (  OCEAN) 0.65 % SOLN nasal spray Place 2 sprays into both nostrils as needed for congestion. Patient not taking: No sig reported 03/21/19 02/21/21  Martyn Ehrich, NP      Allergies    Dust mite extract, Ace inhibitors, and Lovastatin    Review of Systems   Review of Systems  Constitutional:  Positive for fatigue. Negative for chills and fever.  HENT:  Negative for facial swelling and trouble swallowing.   Eyes:  Negative for photophobia and visual disturbance.  Respiratory:  Negative for cough and shortness of breath.   Cardiovascular:  Negative for chest pain and palpitations.  Gastrointestinal:  Negative for abdominal pain, nausea and vomiting.  Endocrine: Negative for polydipsia and polyuria.  Genitourinary:  Negative for difficulty urinating and hematuria.  Musculoskeletal:  Negative for gait problem and joint swelling.  Skin:  Negative for pallor and rash.  Neurological:  Positive for dizziness. Negative for syncope and headaches.  Psychiatric/Behavioral:  Negative for agitation and confusion.    Physical Exam Updated Vital Signs BP (!) 178/83   Pulse 67   Temp (!) 97.4 F (36.3 C) (Oral)   Resp 19   LMP  (LMP Unknown)   SpO2 96%  Physical  Exam Vitals and nursing note reviewed.  Constitutional:      General: She is awake. She is not in acute distress.    Appearance: Normal appearance.  HENT:     Head: Normocephalic and atraumatic. No raccoon eyes, Battle's sign, right periorbital erythema or left periorbital erythema.     Jaw: There is normal jaw occlusion.     Right Ear: Tympanic membrane and external ear normal.     Left Ear: Tympanic membrane and external ear normal.     Nose: Nose normal.     Mouth/Throat:     Mouth: Mucous membranes are moist.  Eyes:     General: No scleral icterus.       Right eye: No discharge.        Left eye: No discharge.     Extraocular Movements: Extraocular movements intact.     Pupils: Pupils are equal, round, and reactive to light.  Cardiovascular:     Rate and Rhythm: Normal rate and regular rhythm.     Pulses: Normal pulses.     Heart sounds: Normal heart sounds.  Pulmonary:     Effort: Pulmonary effort is normal. No respiratory distress.     Breath sounds: Normal breath sounds.  Abdominal:     General: Abdomen is flat.     Palpations: Abdomen is soft.     Tenderness: There is no abdominal tenderness.  Musculoskeletal:        General: Normal range of motion.     Cervical back: Normal range of motion.     Right lower leg: No edema.     Left lower leg: No edema.  Skin:    General: Skin is warm and dry.     Capillary Refill: Capillary refill takes less than 2 seconds.     Comments: RUE HD access with palpable bruit   Neurological:     Mental Status: She is alert, oriented to person, place, and time and easily aroused.     GCS: GCS eye subscore is 4. GCS verbal subscore is 5. GCS motor subscore is 6.     Cranial Nerves: Cranial nerves 2-12 are intact. No cranial nerve deficit, dysarthria or facial asymmetry.     Sensory: Sensation is intact.  Motor: Motor function is intact. No tremor.     Coordination: Coordination is intact. Coordination normal.  Psychiatric:        Mood  and Affect: Mood normal.        Behavior: Behavior normal. Behavior is cooperative.    ED Results / Procedures / Treatments   Labs (all labs ordered are listed, but only abnormal results are displayed) Labs Reviewed  BASIC METABOLIC PANEL - Abnormal; Notable for the following components:      Result Value   Sodium 130 (*)    Chloride 94 (*)    Glucose, Bld 292 (*)    BUN 50 (*)    Creatinine, Ser 5.29 (*)    Calcium 8.4 (*)    GFR, Estimated 8 (*)    All other components within normal limits  CBC - Abnormal; Notable for the following components:   RBC 3.07 (*)    Hemoglobin 9.6 (*)    HCT 27.2 (*)    RDW 17.2 (*)    All other components within normal limits  CBG MONITORING, ED - Abnormal; Notable for the following components:   Glucose-Capillary 272 (*)    All other components within normal limits  RESP PANEL BY RT-PCR (FLU A&B, COVID) [id number]  CBG MONITORING, ED    EKG EKG Interpretation  Date/Time:  Sunday Dec 29 2021 03:11:21 EDT Ventricular Rate:  69 PR Interval:  226 QRS Duration: 92 QT Interval:  406 QTC Calculation: 435 R Axis:   -16 Text Interpretation: Sinus rhythm Prolonged PR interval Probable left atrial enlargement Borderline left axis deviation no stemi Confirmed by Wynona Dove ([id number]) on 12/29/2021 4:26:20 AM  Radiology DG Chest 2 View  Result Date: 12/29/2021 CLINICAL DATA:  Cough EXAM: CHEST - 2 VIEW COMPARISON:  12/23/2021 FINDINGS: The heart size and mediastinal contours are within normal limits. Both lungs are clear. The visualized skeletal structures are unremarkable. IMPRESSION: No active cardiopulmonary disease. Electronically Signed   By: Ulyses Jarred M.D.   On: 12/29/2021 03:54    Procedures Procedures    Medications Ordered in ED Medications  meclizine (ANTIVERT) tablet 12.5 mg (12.5 mg Oral Given 12/29/21 0307)    ED Course/ Medical Decision Making/ A&P                           Medical Decision Making Amount and/or Complexity of  Data Reviewed Labs: ordered. Radiology: ordered.    CC: Fatigue, dizziness, elevated blood pressure, elevated glucose  This patient presents to the Emergency Department for the above complaint. This involves an extensive number of treatment options and is a complaint that carries with it a high risk of complications and morbidity. Vital signs were reviewed. Serious etiologies considered.  DDx includes but is not limited to peripheral vertigo, URI, electrolyte disturbance, anemia, dehydration, hyperglycemia, DKA, bronchitis, pneumonia, PTX, other acute etiologies  Record review:  Previous records obtained and reviewed  Prior ED visits, prior labs and imaging, prior notes  Additional history obtained from [facility] and surgical history as noted above.   Work up as above, notable for:  Labs & imaging results that were available during my care of the patient were visualized by me and considered in my medical decision making.   I ordered imaging studies which included chest x-ray and I visualized the imaging and I agree with radiologist interpretation.  No acute process  Cardiac monitoring reviewed and interpreted personally which shows NSR  Labs reviewed  and are stable, labs are similar to her baseline.  Glucose is elevated but there is no evidence of DKA.  No anion gap.  Bicarb is normal.  She has known history of diabetes mellitus.  Hemoglobin is at her approximate baseline.  Likely anemia of chronic disease associated with her ESRD  Management: Give Antivert.  Reassessment:  Patient is feeling better.  Dizziness has resolved.  She is able to tolerate p.o. intake without difficulty.  Advise she follow-up with her PCP regarding elevated blood pressure reading and her elevated glucose.  At this time a suspicion for hypertensive emergency is very low.  She does not have DKA.   Patient presents with vertigo. On initial evaluation patient appears in no acute distress, afebrile with  normal vital signs. Vertigo most suggestive of peripheral cause. Neuro intact without sign of CNS ischemia or other serious etiology. DC on Meclizine with close PCP F/U. Warnings discussed.   Patient in no distress and overall condition improved here in the ED. Detailed discussions were had with the patient regarding current findings, and need for close f/u with PCP or on call doctor. The patient has been instructed to return immediately if the symptoms worsen in any way for re-evaluation. Patient verbalized understanding and is in agreement with current care plan. All questions answered prior to discharge.               Social determinants of health include -  Hemodialysis Social History   Socioeconomic History   Marital status: Divorced    Spouse name: Not on file   Number of children: 5   Years of education: Not on file   Highest education level: Master's degree (e.g., MA, MS, MEng, MEd, MSW, MBA)  Occupational History   Occupation: Unemployed  Tobacco Use   Smoking status: Never    Passive exposure: Never   Smokeless tobacco: Never  Vaping Use   Vaping Use: Never used  Substance and Sexual Activity   Alcohol use: No   Drug use: No   Sexual activity: Not Currently    Birth control/protection: Post-menopausal, None  Other Topics Concern   Not on file  Social History Narrative   Lives alone.  5 children with two deceased.     Social Determinants of Health   Financial Resource Strain: Not on file  Food Insecurity: Not on file  Transportation Needs: Not on file  Physical Activity: Not on file  Stress: Not on file  Social Connections: Not on file  Intimate Partner Violence: Not on file      This chart was dictated using voice recognition software.  Despite best efforts to proofread,  errors can occur which can change the documentation meaning.         Final Clinical Impression(s) / ED Diagnoses Final diagnoses:  Elevated blood pressure reading   Peripheral vertigo, unspecified laterality  Hyperglycemia    Rx / DC Orders ED Discharge Orders          Ordered    meclizine (ANTIVERT) 12.5 MG tablet  2 times daily PRN        12/29/21 Baker, Shariya Gaster A, DO 12/29/21 [phone]

## 2021-12-29 NOTE — ED Notes (Signed)
Patient verbalizes understanding of d/c instructions. Opportunities for questions and answers were provided. Pt d/c from ED and wheeled to lobby where she is waiting for cab to pick her up.

## 2021-12-30 ENCOUNTER — Ambulatory Visit: Payer: Medicare Other | Admitting: Family Medicine

## 2021-12-30 ENCOUNTER — Other Ambulatory Visit: Payer: Self-pay

## 2021-12-30 DIAGNOSIS — N2581 Secondary hyperparathyroidism of renal origin: Secondary | ICD-10-CM | POA: Diagnosis not present

## 2021-12-30 DIAGNOSIS — N186 End stage renal disease: Secondary | ICD-10-CM | POA: Diagnosis not present

## 2021-12-30 DIAGNOSIS — R6889 Other general symptoms and signs: Secondary | ICD-10-CM | POA: Diagnosis not present

## 2021-12-30 DIAGNOSIS — Z992 Dependence on renal dialysis: Secondary | ICD-10-CM | POA: Diagnosis not present

## 2021-12-31 ENCOUNTER — Other Ambulatory Visit (HOSPITAL_COMMUNITY): Payer: Self-pay

## 2021-12-31 ENCOUNTER — Telehealth: Payer: Self-pay | Admitting: Family Medicine

## 2021-12-31 DIAGNOSIS — N186 End stage renal disease: Secondary | ICD-10-CM | POA: Diagnosis not present

## 2021-12-31 DIAGNOSIS — Z992 Dependence on renal dialysis: Secondary | ICD-10-CM | POA: Diagnosis not present

## 2021-12-31 DIAGNOSIS — N2581 Secondary hyperparathyroidism of renal origin: Secondary | ICD-10-CM | POA: Diagnosis not present

## 2021-12-31 NOTE — Telephone Encounter (Signed)
Copied from [place] [id number]. Topic: General - Other >> Dec 30, 2021  4:21 PM Oneta Rack wrote: Patient would like PCP to place orders for home health aid to assist with shopping, cleaning and cooking. [place], [place] [phone] >> Dec 30, 2021  4:26 PM Oneta Rack wrote: Patient would like request expedited

## 2021-12-31 NOTE — Telephone Encounter (Signed)
PCS form will be started for patient.

## 2022-01-02 ENCOUNTER — Other Ambulatory Visit (HOSPITAL_COMMUNITY): Payer: Self-pay

## 2022-01-02 ENCOUNTER — Telehealth: Payer: Self-pay | Admitting: Family Medicine

## 2022-01-02 ENCOUNTER — Telehealth: Payer: Self-pay

## 2022-01-02 DIAGNOSIS — N2581 Secondary hyperparathyroidism of renal origin: Secondary | ICD-10-CM | POA: Diagnosis not present

## 2022-01-02 DIAGNOSIS — Z992 Dependence on renal dialysis: Secondary | ICD-10-CM | POA: Diagnosis not present

## 2022-01-02 DIAGNOSIS — N186 End stage renal disease: Secondary | ICD-10-CM | POA: Diagnosis not present

## 2022-01-02 NOTE — Telephone Encounter (Signed)
RNCM received inbound call from patient stated she is unsure where she is supposed to get her nebulizer that was prescribed on 12/23/21. Patient states she spoke with [facility] who stated they needed the prescription.   This RNCM unfamiliar with [facility] outpt pharmacy supplying nebulizer. RNCM spoke with [facility] who confirmed they have the neublizer solution not there nedbulizer.   This RNCM did not see any notes on file as to a referral for nebulizer. This RNCM called [facility] who had no record of patient.  This RNCM spoke with [facility] to request nebulizer be sent to patient. [employer] will contact patient.  This RNCM called patient back to advise [employer] will send her the nebulizer in the mail.  No additional TOC needs at this time.

## 2022-01-02 NOTE — Telephone Encounter (Signed)
Copied from [place]. Topic: General - Other >> Jan 02, 2022  1:05 PM Pawlus, Brayton Layman A wrote: Reason for CRM: Pt called in stating she needs a Nebulizer, pt stated she was advised by [facility] outpatient pharmacy to ask Dr Margarita Rana to write her a script for this.

## 2022-01-03 ENCOUNTER — Other Ambulatory Visit: Payer: Self-pay

## 2022-01-03 DIAGNOSIS — Z992 Dependence on renal dialysis: Secondary | ICD-10-CM | POA: Diagnosis not present

## 2022-01-03 DIAGNOSIS — N2581 Secondary hyperparathyroidism of renal origin: Secondary | ICD-10-CM | POA: Diagnosis not present

## 2022-01-03 DIAGNOSIS — N186 End stage renal disease: Secondary | ICD-10-CM | POA: Diagnosis not present

## 2022-01-03 NOTE — Telephone Encounter (Signed)
Patient is requesting a nebulizer machine, I don't see a DX on her chart

## 2022-01-06 DIAGNOSIS — N186 End stage renal disease: Secondary | ICD-10-CM | POA: Diagnosis not present

## 2022-01-06 DIAGNOSIS — R6889 Other general symptoms and signs: Secondary | ICD-10-CM | POA: Diagnosis not present

## 2022-01-06 DIAGNOSIS — Z992 Dependence on renal dialysis: Secondary | ICD-10-CM | POA: Diagnosis not present

## 2022-01-06 DIAGNOSIS — N2581 Secondary hyperparathyroidism of renal origin: Secondary | ICD-10-CM | POA: Diagnosis not present

## 2022-01-07 ENCOUNTER — Other Ambulatory Visit: Payer: Self-pay | Admitting: Pharmacist

## 2022-01-07 ENCOUNTER — Inpatient Hospital Stay: Payer: Medicare HMO | Admitting: Family Medicine

## 2022-01-07 ENCOUNTER — Encounter (HOSPITAL_COMMUNITY): Payer: Self-pay

## 2022-01-07 ENCOUNTER — Other Ambulatory Visit (HOSPITAL_COMMUNITY): Payer: Self-pay

## 2022-01-07 DIAGNOSIS — N186 End stage renal disease: Secondary | ICD-10-CM | POA: Diagnosis not present

## 2022-01-07 DIAGNOSIS — N2581 Secondary hyperparathyroidism of renal origin: Secondary | ICD-10-CM | POA: Diagnosis not present

## 2022-01-07 DIAGNOSIS — Z992 Dependence on renal dialysis: Secondary | ICD-10-CM | POA: Diagnosis not present

## 2022-01-07 MED ORDER — ALBUTEROL SULFATE (2.5 MG/3ML) 0.083% IN NEBU
2.5000 mg | INHALATION_SOLUTION | Freq: Four times a day (QID) | RESPIRATORY_TRACT | 1 refills | Status: DC | PRN
Start: 2022-01-07 — End: 2023-03-03
  Filled 2022-01-07: qty 90, 8d supply, fill #0
  Filled 2022-05-26: qty 90, 8d supply, fill #1

## 2022-01-07 NOTE — Telephone Encounter (Signed)
Call placed to patient and she has received her nebulizer machine and solution has been sent to pharmacy.

## 2022-01-08 ENCOUNTER — Ambulatory Visit (HOSPITAL_COMMUNITY): Payer: Medicare HMO | Admitting: Physician Assistant

## 2022-01-08 DIAGNOSIS — I129 Hypertensive chronic kidney disease with stage 1 through stage 4 chronic kidney disease, or unspecified chronic kidney disease: Secondary | ICD-10-CM | POA: Diagnosis not present

## 2022-01-08 DIAGNOSIS — N186 End stage renal disease: Secondary | ICD-10-CM | POA: Diagnosis not present

## 2022-01-08 DIAGNOSIS — Z992 Dependence on renal dialysis: Secondary | ICD-10-CM | POA: Diagnosis not present

## 2022-01-08 DIAGNOSIS — R6889 Other general symptoms and signs: Secondary | ICD-10-CM | POA: Diagnosis not present

## 2022-01-09 ENCOUNTER — Other Ambulatory Visit (HOSPITAL_COMMUNITY): Payer: Self-pay

## 2022-01-09 ENCOUNTER — Other Ambulatory Visit: Payer: Self-pay

## 2022-01-09 ENCOUNTER — Ambulatory Visit: Payer: Medicare HMO | Attending: Family Medicine | Admitting: Family Medicine

## 2022-01-09 ENCOUNTER — Encounter: Payer: Self-pay | Admitting: Family Medicine

## 2022-01-09 DIAGNOSIS — N186 End stage renal disease: Secondary | ICD-10-CM | POA: Diagnosis not present

## 2022-01-09 DIAGNOSIS — E1122 Type 2 diabetes mellitus with diabetic chronic kidney disease: Secondary | ICD-10-CM

## 2022-01-09 DIAGNOSIS — N2581 Secondary hyperparathyroidism of renal origin: Secondary | ICD-10-CM | POA: Diagnosis not present

## 2022-01-09 DIAGNOSIS — Z992 Dependence on renal dialysis: Secondary | ICD-10-CM | POA: Diagnosis not present

## 2022-01-09 MED ORDER — MISC. DEVICES MISC: 0 refills | Status: AC

## 2022-01-09 NOTE — Progress Notes (Signed)
Virtual Visit via Telephone Note  I connected with Paula [name], on 01/09/2022 at 9:35 AM by telephone and verified that I am speaking with the correct person using two identifiers.   Consent: I discussed the limitations, risks, security and privacy concerns of performing an evaluation and management service by telephone and the availability of in person appointments. I also discussed with the patient that there may be a patient responsible charge related to this service. The patient expressed understanding and agreed to proceed.   Location of Patient: Home  Location of Provider: Clinic   Persons participating in Telemedicine visit: Judithann Villamar [facility] Daughter Dr. Margarita Rana     History of Present Illness: Paula [name] is a 76 y.o. 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 She needs help with Grocery, Cooking, ADLs She currently has a HHRN from [facility]  She is currently on her way for her hemodialysis session but daughter states that she needs a fistula sleeve for R arm.  She states during hemodialysis her right arm gets cold. She has questions about her glipizide as she has a prescription for both 10 mg Glipizide which is an old bottle and a recent prescription for 2.5 mg daily.  Past Medical History:  Diagnosis Date   Allergic rhinitis    Arthritis    Asthma    Brain aneurysm    Chronic kidney disease    Cough    Diabetes mellitus    Diastolic CHF, chronic ([place]) 10/11/2011   GERD (gastroesophageal reflux disease)    History of colon polyps  2012   tubular adenoma    Hyperlipidemia    Hypertension    Neuropathy 10/11/2011   PONV (postoperative nausea and vomiting)    one time after lymph node surgery   Allergies  Allergen Reactions   Dust Mite Extract Cough   Ace Inhibitors Cough   Lovastatin Other (See Comments)    Generalized body pain    Current Outpatient Medications on File Prior to Visit  Medication Sig Dispense Refill   albuterol (PROVENTIL) (2.5 MG/3ML) 0.083% nebulizer solution Take 3 mLs (1 vial) by nebulization every 6 hours as needed for wheezing or shortness of breath. 90 mL 1   acetaminophen (TYLENOL) 500 MG tablet Take 500 mg by mouth every 6 (six) hours as needed for fever, moderate pain or mild pain.     albuterol (PROAIR HFA) 108 (90 Base) MCG/ACT inhaler Inhale 1-2 puffs into the lungs every 6 (six) hours as needed for wheezing or shortness of breath. 18 g 0   amLODipine (NORVASC) 5 MG tablet Take 1 tablet (5 mg total) by mouth daily. 30 tablet 0   apixaban (ELIQUIS) 5 MG TABS tablet Take 1 tablet by mouth 2 times daily. 60 tablet 0   aspirin EC 81 MG tablet Take 81 mg by mouth every evening.     atorvastatin (LIPITOR) 80 MG tablet Take 1 tablet (80 mg total) by mouth daily. 90 tablet 1   calcium acetate (PHOSLO) 667 MG capsule Take 1 capsule by mouth 3 times daily 270 capsule 6   carvedilol (COREG) 12.5 MG tablet  Take 1 tablet (12.5 mg total) by mouth 2 (two) times daily with a meal. 180 tablet 1   dextromethorphan (DELSYM) 30 MG/5ML liquid Take 30 mg by mouth daily as needed for cough. (Patient not taking: Reported on 11/15/2021)     ethyl chloride spray Apply a small amount on skin immediately before cannulation 116 mL 0   fluticasone (FLONASE) 50 MCG/ACT nasal spray Place 2 sprays into both nostrils daily. (Patient not taking: Reported on 11/13/2021) 16 g 2   gabapentin (NEURONTIN) 100 MG capsule Take 100 mg by mouth 3 (three) times daily.     glipiZIDE (GLUCOTROL XL) 2.5 MG 24 hr tablet Take 1 tablet  (2.5 mg total) by mouth daily with breakfast. (Patient taking differently: Take 1.25 mg by mouth daily with breakfast.) 30 tablet 3   glucose blood (ACCU-CHEK GUIDE) test strip Use as instructed once daily 100 each 12   guaiFENesin-dextromethorphan (ROBITUSSIN DM) 100-10 MG/5ML syrup Take 10 mLs by mouth every 4 (four) hours as needed for cough. (Patient not taking: Reported on 11/13/2021) 118 mL 0   Homeopathic Products (EARACHE DROPS OT) Place 1 drop into both ears daily.     isosorbide mononitrate (IMDUR) 60 MG 24 hr tablet TAKE 1 TABLET (60 MG TOTAL) BY MOUTH DAILY. (Patient taking differently: Take 60 mg by mouth daily.) 90 tablet 1   lidocaine-prilocaine (EMLA) cream Apply a small amount to dialysis access one hour prior to dialysis. Wrap with plastic wrap. 30 g PRN   loratadine (CLARITIN) 10 MG tablet Take 1 tablet (10 mg total) by mouth daily. 30 tablet 11   meclizine (ANTIVERT) 12.5 MG tablet Take 1 tablet (12.5 mg total) by mouth 2 (two) times daily as needed for dizziness. 30 tablet 0   Misc. Devices [employer].  Diagnosis - unstable gait 1 each 0   Polyethyl Glycol-Propyl Glycol (SYSTANE OP) Place 1 drop into both eyes daily.     torsemide (DEMADEX) 20 MG tablet Take 2 tablets by mouth 2 times daily 120 tablet 4   Torsemide 40 MG TABS Take 80 mg by mouth 2 (two) times daily. (Patient not taking: Reported on 11/13/2021) 60 tablet 1   [DISCONTINUED] sodium chloride (OCEAN) 0.65 % SOLN nasal spray Place 2 sprays into both nostrils as needed for congestion. (Patient not taking: No sig reported) 30 mL 3   No current facility-administered medications on file prior to visit.    ROS: See HPI  Observations/Objective: Awake, alert, oriented x3 Not in acute distress Normal mood     Latest Ref Rng & Units 12/29/2021    1:52 AM 12/23/2021    4:16 PM 11/13/2021    4:46 AM  CMP  Glucose 70 - 99 mg/dL 292   224   121    BUN 8 - 23 mg/dL 50   21   87    Creatinine 0.44 - 1.00 mg/dL 5.29   3.38    6.97    Sodium 135 - 145 mmol/L 130   133   133    Potassium 3.5 - 5.1 mmol/L 3.8   3.5   4.2    Chloride 98 - 111 mmol/L 94   96   95    CO2 22 - 32 mmol/L 26   28   26     Calcium 8.9 - 10.3 mg/dL 8.4   8.5   6.9    Total Protein 6.5 - 8.1 g/dL  7.1   6.8    Total Bilirubin 0.3 -  1.2 mg/dL  1.2   0.5    Alkaline Phos 38 - 126 U/L  108   106    AST 15 - 41 U/L  22   20    ALT 0 - 44 U/L  21   19      Lipid Panel     Component Value Date/Time   CHOL 106 09/01/2021 1041   CHOL 121 06/17/2021 0904   TRIG 61 09/01/2021 1041   HDL 46 09/01/2021 1041   HDL 46 06/17/2021 0904   CHOLHDL 2.3 09/01/2021 1041   VLDL 12 09/01/2021 1041   LDLCALC 48 09/01/2021 1041   LDLCALC 61 06/17/2021 0904   LABVLDL 14 06/17/2021 0904    Lab Results  Component Value Date   HGBA1C 7.6 (H) 10/15/2021    Assessment and Plan: 1. Controlled type 2 diabetes mellitus with chronic kidney disease, without long-term current use of insulin, unspecified CKD stage ([place]) Controlled with A1c of 7.6 Advised to continue with glipizide 2.5 mg daily and discard the 10 mg Counseled on Diabetic diet, my plate method, 027 minutes of moderate intensity exercise/week Blood sugar logs with fasting goals of 80-120 mg/dl, random of less than 180 and in the event of sugars less than 60 mg/dl or greater than 400 mg/dl encouraged to notify the clinic. Advised on the need for annual eye exams, annual foot exams, Pneumonia vaccine.  2. End stage chronic kidney disease ([place]) Currently on hemodialysis I have written prescription for Fistula sleeve for her.   Follow Up Instructions: Keep previously scheduled appointment   I discussed the assessment and treatment plan with the patient. The patient was provided an opportunity to ask questions and all were answered. The patient agreed with the plan and demonstrated an understanding of the instructions.   The patient was advised to call back or seek an in-person evaluation if the  symptoms worsen or if the condition fails to improve as anticipated.     I provided 10 minutes total of non-face-to-face time during this encounter.   Charlott Rakes, MD, FAAFP. [facility] and [place], [place]   01/09/2022, 9:35 AM

## 2022-01-10 ENCOUNTER — Encounter (HOSPITAL_COMMUNITY): Payer: Self-pay

## 2022-01-10 ENCOUNTER — Other Ambulatory Visit: Payer: Self-pay

## 2022-01-10 DIAGNOSIS — Z992 Dependence on renal dialysis: Secondary | ICD-10-CM | POA: Diagnosis not present

## 2022-01-10 DIAGNOSIS — N2581 Secondary hyperparathyroidism of renal origin: Secondary | ICD-10-CM | POA: Diagnosis not present

## 2022-01-10 DIAGNOSIS — N186 End stage renal disease: Secondary | ICD-10-CM | POA: Diagnosis not present

## 2022-01-10 DIAGNOSIS — R6889 Other general symptoms and signs: Secondary | ICD-10-CM | POA: Diagnosis not present

## 2022-01-10 MED ORDER — ETHYL CHLORIDE EX AERO
INHALATION_SPRAY | CUTANEOUS | 99 refills | Status: DC
  Filled 2022-01-10 (×2): qty 116, 30d supply, fill #0
  Filled 2022-03-12: qty 116, 30d supply, fill #1
  Filled 2022-05-05: qty 116, 30d supply, fill #2
  Filled 2022-07-01: qty 116, 30d supply, fill #3
  Filled 2022-09-30: qty 116, 30d supply, fill #4

## 2022-01-11 DIAGNOSIS — R059 Cough, unspecified: Secondary | ICD-10-CM | POA: Diagnosis not present

## 2022-01-11 DIAGNOSIS — I10 Essential (primary) hypertension: Secondary | ICD-10-CM | POA: Diagnosis not present

## 2022-01-13 ENCOUNTER — Other Ambulatory Visit: Payer: Self-pay

## 2022-01-13 DIAGNOSIS — N186 End stage renal disease: Secondary | ICD-10-CM | POA: Diagnosis not present

## 2022-01-13 DIAGNOSIS — N2581 Secondary hyperparathyroidism of renal origin: Secondary | ICD-10-CM | POA: Diagnosis not present

## 2022-01-13 DIAGNOSIS — Z992 Dependence on renal dialysis: Secondary | ICD-10-CM | POA: Diagnosis not present

## 2022-01-13 DIAGNOSIS — R6889 Other general symptoms and signs: Secondary | ICD-10-CM | POA: Diagnosis not present

## 2022-01-15 DIAGNOSIS — N186 End stage renal disease: Secondary | ICD-10-CM | POA: Diagnosis not present

## 2022-01-15 DIAGNOSIS — N2581 Secondary hyperparathyroidism of renal origin: Secondary | ICD-10-CM | POA: Diagnosis not present

## 2022-01-15 DIAGNOSIS — Z992 Dependence on renal dialysis: Secondary | ICD-10-CM | POA: Diagnosis not present

## 2022-01-17 DIAGNOSIS — N186 End stage renal disease: Secondary | ICD-10-CM | POA: Diagnosis not present

## 2022-01-17 DIAGNOSIS — Z992 Dependence on renal dialysis: Secondary | ICD-10-CM | POA: Diagnosis not present

## 2022-01-17 DIAGNOSIS — N2581 Secondary hyperparathyroidism of renal origin: Secondary | ICD-10-CM | POA: Diagnosis not present

## 2022-01-20 ENCOUNTER — Other Ambulatory Visit (HOSPITAL_COMMUNITY): Payer: Self-pay | Admitting: Interventional Radiology

## 2022-01-20 DIAGNOSIS — I671 Cerebral aneurysm, nonruptured: Secondary | ICD-10-CM

## 2022-01-20 DIAGNOSIS — N186 End stage renal disease: Secondary | ICD-10-CM | POA: Diagnosis not present

## 2022-01-20 DIAGNOSIS — Z992 Dependence on renal dialysis: Secondary | ICD-10-CM | POA: Diagnosis not present

## 2022-01-20 DIAGNOSIS — N2581 Secondary hyperparathyroidism of renal origin: Secondary | ICD-10-CM | POA: Diagnosis not present

## 2022-01-22 DIAGNOSIS — N2581 Secondary hyperparathyroidism of renal origin: Secondary | ICD-10-CM | POA: Diagnosis not present

## 2022-01-22 DIAGNOSIS — Z992 Dependence on renal dialysis: Secondary | ICD-10-CM | POA: Diagnosis not present

## 2022-01-22 DIAGNOSIS — N186 End stage renal disease: Secondary | ICD-10-CM | POA: Diagnosis not present

## 2022-01-24 ENCOUNTER — Encounter (HOSPITAL_COMMUNITY): Payer: Medicare HMO

## 2022-01-24 DIAGNOSIS — N2581 Secondary hyperparathyroidism of renal origin: Secondary | ICD-10-CM | POA: Diagnosis not present

## 2022-01-24 DIAGNOSIS — Z992 Dependence on renal dialysis: Secondary | ICD-10-CM | POA: Diagnosis not present

## 2022-01-24 DIAGNOSIS — N186 End stage renal disease: Secondary | ICD-10-CM | POA: Diagnosis not present

## 2022-01-26 ENCOUNTER — Other Ambulatory Visit (HOSPITAL_COMMUNITY): Payer: Self-pay

## 2022-01-26 ENCOUNTER — Other Ambulatory Visit: Payer: Self-pay | Admitting: Family Medicine

## 2022-01-27 ENCOUNTER — Other Ambulatory Visit (HOSPITAL_COMMUNITY): Payer: Self-pay

## 2022-01-27 DIAGNOSIS — N186 End stage renal disease: Secondary | ICD-10-CM | POA: Diagnosis not present

## 2022-01-27 DIAGNOSIS — Z992 Dependence on renal dialysis: Secondary | ICD-10-CM | POA: Diagnosis not present

## 2022-01-27 DIAGNOSIS — N2581 Secondary hyperparathyroidism of renal origin: Secondary | ICD-10-CM | POA: Diagnosis not present

## 2022-01-28 ENCOUNTER — Other Ambulatory Visit (HOSPITAL_COMMUNITY): Payer: Self-pay

## 2022-01-29 DIAGNOSIS — N2581 Secondary hyperparathyroidism of renal origin: Secondary | ICD-10-CM | POA: Diagnosis not present

## 2022-01-29 DIAGNOSIS — N186 End stage renal disease: Secondary | ICD-10-CM | POA: Diagnosis not present

## 2022-01-29 DIAGNOSIS — Z992 Dependence on renal dialysis: Secondary | ICD-10-CM | POA: Diagnosis not present

## 2022-01-30 ENCOUNTER — Other Ambulatory Visit: Payer: Self-pay | Admitting: Pharmacist

## 2022-01-30 ENCOUNTER — Other Ambulatory Visit: Payer: Self-pay | Admitting: Emergency Medicine

## 2022-01-30 ENCOUNTER — Other Ambulatory Visit (HOSPITAL_COMMUNITY): Payer: Self-pay

## 2022-01-30 ENCOUNTER — Inpatient Hospital Stay: Payer: Medicare HMO | Admitting: Physician Assistant

## 2022-01-30 ENCOUNTER — Ambulatory Visit (HOSPITAL_COMMUNITY): Admission: RE | Admit: 2022-01-30 | Payer: Medicare HMO | Source: Ambulatory Visit

## 2022-01-30 ENCOUNTER — Ambulatory Visit: Payer: Self-pay | Admitting: *Deleted

## 2022-01-30 MED ORDER — AMLODIPINE BESYLATE 5 MG PO TABS
5.0000 mg | ORAL_TABLET | Freq: Every day | ORAL | 0 refills | Status: DC
  Filled 2022-01-30: qty 30, 30d supply, fill #0

## 2022-01-30 MED ORDER — APIXABAN 5 MG PO TABS
5.0000 mg | ORAL_TABLET | Freq: Two times a day (BID) | ORAL | 0 refills | Status: DC
Start: 2022-01-30 — End: 2022-02-26
  Filled 2022-01-30: qty 60, 30d supply, fill #0

## 2022-01-30 NOTE — Telephone Encounter (Signed)
Reason for Disposition  [1] Caller has URGENT medicine question about med that PCP or specialist prescribed AND [2] triager unable to answer question  Answer Assessment - Initial Assessment Questions 1. NAME of MEDICATION: "What medicine are you calling about?"     Had appt today but cancelled it.    I'm sick I need my Eliquis and amlodipine refilled.  I'm on dialysis.   2. QUESTION: "What is your question?" (e.g., double dose of medicine, side effect)     I need my medications refilled. 3. PRESCRIBING HCP: "Who prescribed it?" Reason: if prescribed by specialist, call should be referred to that group.     Dr. Margarita Rana 4. SYMPTOMS: "Do you have any symptoms?"     N/A 5. SEVERITY: If symptoms are present, ask "Are they mild, moderate or severe?"     N/A 6. PREGNANCY:  "Is there any chance that you are pregnant?" "When was your last menstrual period?"     N/A  Protocols used: Medication Question Call-A-AH

## 2022-01-30 NOTE — Telephone Encounter (Signed)
Medications have been refilled ?

## 2022-01-30 NOTE — Telephone Encounter (Signed)
Routing to PCP for review.

## 2022-01-30 NOTE — Telephone Encounter (Signed)
  Chief Complaint: She needs her medication refilled but needs new prescriptions sent in for the Eliquis and amlodipine.  She had an appt today with Freeman Caldron but cancelled it because she is sick.  She is out of the Eliquis and concerned because she has had a stroke which is why she is on it.  Upset pharmacy won't fill it.  On dialysis MWF Symptoms: N/A Frequency: N/A Pertinent Negatives: Patient denies N/A Disposition: [] ED /[] Urgent Care (no appt availability in office) / [] Appointment(In office/virtual)/ []  [facility]/ [] Home Care/ [] Refused Recommended Disposition /[] [place]/  Follow-up with PCP Additional Notes: Message sent to Dr. Margarita Rana at [facility] for new prescriptions for her medications.   They are expired.

## 2022-01-31 DIAGNOSIS — N2581 Secondary hyperparathyroidism of renal origin: Secondary | ICD-10-CM | POA: Diagnosis not present

## 2022-01-31 DIAGNOSIS — N186 End stage renal disease: Secondary | ICD-10-CM | POA: Diagnosis not present

## 2022-01-31 DIAGNOSIS — Z992 Dependence on renal dialysis: Secondary | ICD-10-CM | POA: Diagnosis not present

## 2022-01-31 NOTE — Telephone Encounter (Signed)
Pt was called and informed of medication being refilled. 

## 2022-02-03 ENCOUNTER — Other Ambulatory Visit: Payer: Self-pay

## 2022-02-03 ENCOUNTER — Other Ambulatory Visit (HOSPITAL_COMMUNITY): Payer: Self-pay

## 2022-02-03 DIAGNOSIS — Z992 Dependence on renal dialysis: Secondary | ICD-10-CM | POA: Diagnosis not present

## 2022-02-03 DIAGNOSIS — N186 End stage renal disease: Secondary | ICD-10-CM | POA: Diagnosis not present

## 2022-02-03 DIAGNOSIS — N2581 Secondary hyperparathyroidism of renal origin: Secondary | ICD-10-CM | POA: Diagnosis not present

## 2022-02-05 DIAGNOSIS — N2581 Secondary hyperparathyroidism of renal origin: Secondary | ICD-10-CM | POA: Diagnosis not present

## 2022-02-05 DIAGNOSIS — N186 End stage renal disease: Secondary | ICD-10-CM | POA: Diagnosis not present

## 2022-02-05 DIAGNOSIS — Z992 Dependence on renal dialysis: Secondary | ICD-10-CM | POA: Diagnosis not present

## 2022-02-07 ENCOUNTER — Other Ambulatory Visit (HOSPITAL_COMMUNITY): Payer: Self-pay

## 2022-02-07 DIAGNOSIS — I129 Hypertensive chronic kidney disease with stage 1 through stage 4 chronic kidney disease, or unspecified chronic kidney disease: Secondary | ICD-10-CM | POA: Diagnosis not present

## 2022-02-07 DIAGNOSIS — N2581 Secondary hyperparathyroidism of renal origin: Secondary | ICD-10-CM | POA: Diagnosis not present

## 2022-02-07 DIAGNOSIS — Z992 Dependence on renal dialysis: Secondary | ICD-10-CM | POA: Diagnosis not present

## 2022-02-07 DIAGNOSIS — N186 End stage renal disease: Secondary | ICD-10-CM | POA: Diagnosis not present

## 2022-02-10 ENCOUNTER — Other Ambulatory Visit (HOSPITAL_COMMUNITY): Payer: Self-pay

## 2022-02-10 ENCOUNTER — Other Ambulatory Visit: Payer: Self-pay

## 2022-02-10 DIAGNOSIS — N2581 Secondary hyperparathyroidism of renal origin: Secondary | ICD-10-CM | POA: Diagnosis not present

## 2022-02-10 DIAGNOSIS — Z992 Dependence on renal dialysis: Secondary | ICD-10-CM | POA: Diagnosis not present

## 2022-02-10 DIAGNOSIS — N186 End stage renal disease: Secondary | ICD-10-CM | POA: Diagnosis not present

## 2022-02-12 DIAGNOSIS — Z992 Dependence on renal dialysis: Secondary | ICD-10-CM | POA: Diagnosis not present

## 2022-02-12 DIAGNOSIS — N186 End stage renal disease: Secondary | ICD-10-CM | POA: Diagnosis not present

## 2022-02-12 DIAGNOSIS — N2581 Secondary hyperparathyroidism of renal origin: Secondary | ICD-10-CM | POA: Diagnosis not present

## 2022-02-13 ENCOUNTER — Other Ambulatory Visit (HOSPITAL_COMMUNITY): Payer: Self-pay

## 2022-02-14 DIAGNOSIS — N2581 Secondary hyperparathyroidism of renal origin: Secondary | ICD-10-CM | POA: Diagnosis not present

## 2022-02-14 DIAGNOSIS — N186 End stage renal disease: Secondary | ICD-10-CM | POA: Diagnosis not present

## 2022-02-14 DIAGNOSIS — Z992 Dependence on renal dialysis: Secondary | ICD-10-CM | POA: Diagnosis not present

## 2022-02-17 ENCOUNTER — Other Ambulatory Visit: Payer: Self-pay | Admitting: Family Medicine

## 2022-02-17 DIAGNOSIS — N186 End stage renal disease: Secondary | ICD-10-CM | POA: Diagnosis not present

## 2022-02-17 DIAGNOSIS — N2581 Secondary hyperparathyroidism of renal origin: Secondary | ICD-10-CM | POA: Diagnosis not present

## 2022-02-17 DIAGNOSIS — Z992 Dependence on renal dialysis: Secondary | ICD-10-CM | POA: Diagnosis not present

## 2022-02-18 ENCOUNTER — Other Ambulatory Visit (HOSPITAL_COMMUNITY): Payer: Self-pay

## 2022-02-18 ENCOUNTER — Encounter (HOSPITAL_COMMUNITY): Payer: Self-pay

## 2022-02-18 MED ORDER — ALBUTEROL SULFATE HFA 108 (90 BASE) MCG/ACT IN AERS
1.0000 | INHALATION_SPRAY | Freq: Four times a day (QID) | RESPIRATORY_TRACT | 0 refills | Status: DC | PRN
  Filled 2022-02-18: qty 18, 25d supply, fill #0

## 2022-02-18 NOTE — Telephone Encounter (Signed)
Requested Prescriptions  Pending Prescriptions Disp Refills  . albuterol (PROAIR HFA) 108 (90 Base) MCG/ACT inhaler 18 g 0    Sig: Inhale 1-2 puffs into the lungs every 6 (six) hours as needed for wheezing or shortness of breath.     Pulmonology:  Beta Agonists 2 Failed - 02/17/2022 11:46 PM      Failed - Last BP in normal range    BP Readings from Last 1 Encounters:  12/29/21 (!) 178/83         Passed - Last Heart Rate in normal range    Pulse Readings from Last 1 Encounters:  12/29/21 67         Passed - Valid encounter within last 12 months    Recent Outpatient Visits          1 month ago Controlled type 2 diabetes mellitus with chronic kidney disease, without long-term current use of insulin, unspecified CKD stage ([place])   [place], [place]orne, MD   3 months ago Type 2 diabetes mellitus with other neurologic complication, without long-term current use of insulin ([place])   [place], Charlane Ferretti, MD   4 months ago Other insomnia   [place], [place], MD   8 months ago Type 2 diabetes mellitus with other neurologic complication, without long-term current use of insulin ([place])   [place], [place], MD   11 months ago Basilar artery stenosis   [facility] Charlott Rakes, MD

## 2022-02-19 ENCOUNTER — Other Ambulatory Visit (HOSPITAL_COMMUNITY): Payer: Self-pay

## 2022-02-19 DIAGNOSIS — Z992 Dependence on renal dialysis: Secondary | ICD-10-CM | POA: Diagnosis not present

## 2022-02-19 DIAGNOSIS — N2581 Secondary hyperparathyroidism of renal origin: Secondary | ICD-10-CM | POA: Diagnosis not present

## 2022-02-19 DIAGNOSIS — N186 End stage renal disease: Secondary | ICD-10-CM | POA: Diagnosis not present

## 2022-02-21 DIAGNOSIS — Z992 Dependence on renal dialysis: Secondary | ICD-10-CM | POA: Diagnosis not present

## 2022-02-21 DIAGNOSIS — N186 End stage renal disease: Secondary | ICD-10-CM | POA: Diagnosis not present

## 2022-02-21 DIAGNOSIS — N2581 Secondary hyperparathyroidism of renal origin: Secondary | ICD-10-CM | POA: Diagnosis not present

## 2022-02-24 DIAGNOSIS — N186 End stage renal disease: Secondary | ICD-10-CM | POA: Diagnosis not present

## 2022-02-24 DIAGNOSIS — Z992 Dependence on renal dialysis: Secondary | ICD-10-CM | POA: Diagnosis not present

## 2022-02-24 DIAGNOSIS — N2581 Secondary hyperparathyroidism of renal origin: Secondary | ICD-10-CM | POA: Diagnosis not present

## 2022-02-25 ENCOUNTER — Other Ambulatory Visit: Payer: Self-pay | Admitting: Family Medicine

## 2022-02-25 ENCOUNTER — Other Ambulatory Visit (HOSPITAL_COMMUNITY): Payer: Self-pay

## 2022-02-25 MED ORDER — AMLODIPINE BESYLATE 5 MG PO TABS
5.0000 mg | ORAL_TABLET | Freq: Every day | ORAL | 0 refills | Status: DC
  Filled 2022-02-25: qty 90, 90d supply, fill #0

## 2022-02-26 ENCOUNTER — Other Ambulatory Visit: Payer: Self-pay | Admitting: Family Medicine

## 2022-02-26 ENCOUNTER — Other Ambulatory Visit (HOSPITAL_COMMUNITY): Payer: Self-pay

## 2022-02-26 DIAGNOSIS — Z992 Dependence on renal dialysis: Secondary | ICD-10-CM | POA: Diagnosis not present

## 2022-02-26 DIAGNOSIS — N186 End stage renal disease: Secondary | ICD-10-CM | POA: Diagnosis not present

## 2022-02-26 DIAGNOSIS — N2581 Secondary hyperparathyroidism of renal origin: Secondary | ICD-10-CM | POA: Diagnosis not present

## 2022-02-26 MED ORDER — APIXABAN 5 MG PO TABS
5.0000 mg | ORAL_TABLET | Freq: Two times a day (BID) | ORAL | 3 refills | Status: DC
  Filled 2022-02-26: qty 60, 30d supply, fill #0
  Filled 2022-03-31: qty 60, 30d supply, fill #1
  Filled 2022-04-28: qty 60, 30d supply, fill #2
  Filled 2022-05-26: qty 60, 30d supply, fill #3

## 2022-02-27 DIAGNOSIS — N281 Cyst of kidney, acquired: Secondary | ICD-10-CM | POA: Diagnosis not present

## 2022-02-27 DIAGNOSIS — R6889 Other general symptoms and signs: Secondary | ICD-10-CM | POA: Diagnosis not present

## 2022-02-28 DIAGNOSIS — N186 End stage renal disease: Secondary | ICD-10-CM | POA: Diagnosis not present

## 2022-02-28 DIAGNOSIS — Z992 Dependence on renal dialysis: Secondary | ICD-10-CM | POA: Diagnosis not present

## 2022-02-28 DIAGNOSIS — N2581 Secondary hyperparathyroidism of renal origin: Secondary | ICD-10-CM | POA: Diagnosis not present

## 2022-03-03 DIAGNOSIS — Z992 Dependence on renal dialysis: Secondary | ICD-10-CM | POA: Diagnosis not present

## 2022-03-03 DIAGNOSIS — N2581 Secondary hyperparathyroidism of renal origin: Secondary | ICD-10-CM | POA: Diagnosis not present

## 2022-03-03 DIAGNOSIS — N186 End stage renal disease: Secondary | ICD-10-CM | POA: Diagnosis not present

## 2022-03-05 ENCOUNTER — Other Ambulatory Visit (HOSPITAL_COMMUNITY): Payer: Self-pay

## 2022-03-05 DIAGNOSIS — N2581 Secondary hyperparathyroidism of renal origin: Secondary | ICD-10-CM | POA: Diagnosis not present

## 2022-03-05 DIAGNOSIS — Z992 Dependence on renal dialysis: Secondary | ICD-10-CM | POA: Diagnosis not present

## 2022-03-05 DIAGNOSIS — N186 End stage renal disease: Secondary | ICD-10-CM | POA: Diagnosis not present

## 2022-03-06 ENCOUNTER — Ambulatory Visit (HOSPITAL_BASED_OUTPATIENT_CLINIC_OR_DEPARTMENT_OTHER)
Admission: RE | Admit: 2022-03-06 | Discharge: 2022-03-06 | Disposition: A | Discharge status: Home / Self Care | Payer: Medicare HMO | Source: Ambulatory Visit | Attending: Interventional Radiology | Admitting: Interventional Radiology

## 2022-03-06 DIAGNOSIS — I671 Cerebral aneurysm, nonruptured: Secondary | ICD-10-CM | POA: Insufficient documentation

## 2022-03-06 DIAGNOSIS — I6523 Occlusion and stenosis of bilateral carotid arteries: Secondary | ICD-10-CM | POA: Diagnosis not present

## 2022-03-06 DIAGNOSIS — R6889 Other general symptoms and signs: Secondary | ICD-10-CM | POA: Diagnosis not present

## 2022-03-07 DIAGNOSIS — N2581 Secondary hyperparathyroidism of renal origin: Secondary | ICD-10-CM | POA: Diagnosis not present

## 2022-03-07 DIAGNOSIS — N186 End stage renal disease: Secondary | ICD-10-CM | POA: Diagnosis not present

## 2022-03-07 DIAGNOSIS — Z992 Dependence on renal dialysis: Secondary | ICD-10-CM | POA: Diagnosis not present

## 2022-03-10 DIAGNOSIS — N2581 Secondary hyperparathyroidism of renal origin: Secondary | ICD-10-CM | POA: Diagnosis not present

## 2022-03-10 DIAGNOSIS — I129 Hypertensive chronic kidney disease with stage 1 through stage 4 chronic kidney disease, or unspecified chronic kidney disease: Secondary | ICD-10-CM | POA: Diagnosis not present

## 2022-03-10 DIAGNOSIS — N186 End stage renal disease: Secondary | ICD-10-CM | POA: Diagnosis not present

## 2022-03-10 DIAGNOSIS — Z992 Dependence on renal dialysis: Secondary | ICD-10-CM | POA: Diagnosis not present

## 2022-03-11 ENCOUNTER — Other Ambulatory Visit: Payer: Self-pay | Admitting: Family Medicine

## 2022-03-11 DIAGNOSIS — E1149 Type 2 diabetes mellitus with other diabetic neurological complication: Secondary | ICD-10-CM

## 2022-03-11 DIAGNOSIS — N281 Cyst of kidney, acquired: Secondary | ICD-10-CM | POA: Diagnosis not present

## 2022-03-11 DIAGNOSIS — I700 Atherosclerosis of aorta: Secondary | ICD-10-CM | POA: Diagnosis not present

## 2022-03-12 ENCOUNTER — Telehealth (HOSPITAL_COMMUNITY): Payer: Self-pay

## 2022-03-12 ENCOUNTER — Other Ambulatory Visit (HOSPITAL_COMMUNITY): Payer: Self-pay

## 2022-03-12 ENCOUNTER — Encounter (HOSPITAL_COMMUNITY): Payer: Self-pay

## 2022-03-12 DIAGNOSIS — N186 End stage renal disease: Secondary | ICD-10-CM | POA: Diagnosis not present

## 2022-03-12 DIAGNOSIS — N2581 Secondary hyperparathyroidism of renal origin: Secondary | ICD-10-CM | POA: Diagnosis not present

## 2022-03-12 DIAGNOSIS — Z992 Dependence on renal dialysis: Secondary | ICD-10-CM | POA: Diagnosis not present

## 2022-03-12 NOTE — Telephone Encounter (Signed)
Pt agreed to f/u in 6 months with an mra. AW 

## 2022-03-13 ENCOUNTER — Other Ambulatory Visit (HOSPITAL_COMMUNITY): Payer: Self-pay

## 2022-03-13 ENCOUNTER — Encounter (HOSPITAL_COMMUNITY): Payer: Self-pay

## 2022-03-14 DIAGNOSIS — N186 End stage renal disease: Secondary | ICD-10-CM | POA: Diagnosis not present

## 2022-03-14 DIAGNOSIS — N2581 Secondary hyperparathyroidism of renal origin: Secondary | ICD-10-CM | POA: Diagnosis not present

## 2022-03-14 DIAGNOSIS — Z992 Dependence on renal dialysis: Secondary | ICD-10-CM | POA: Diagnosis not present

## 2022-03-17 DIAGNOSIS — N2581 Secondary hyperparathyroidism of renal origin: Secondary | ICD-10-CM | POA: Diagnosis not present

## 2022-03-17 DIAGNOSIS — N186 End stage renal disease: Secondary | ICD-10-CM | POA: Diagnosis not present

## 2022-03-17 DIAGNOSIS — Z992 Dependence on renal dialysis: Secondary | ICD-10-CM | POA: Diagnosis not present

## 2022-03-19 DIAGNOSIS — Z992 Dependence on renal dialysis: Secondary | ICD-10-CM | POA: Diagnosis not present

## 2022-03-19 DIAGNOSIS — N2581 Secondary hyperparathyroidism of renal origin: Secondary | ICD-10-CM | POA: Diagnosis not present

## 2022-03-19 DIAGNOSIS — N186 End stage renal disease: Secondary | ICD-10-CM | POA: Diagnosis not present

## 2022-03-20 DIAGNOSIS — H16223 Keratoconjunctivitis sicca, not specified as Sjogren's, bilateral: Secondary | ICD-10-CM | POA: Diagnosis not present

## 2022-03-20 DIAGNOSIS — H35033 Hypertensive retinopathy, bilateral: Secondary | ICD-10-CM | POA: Diagnosis not present

## 2022-03-20 DIAGNOSIS — H524 Presbyopia: Secondary | ICD-10-CM | POA: Diagnosis not present

## 2022-03-20 DIAGNOSIS — E119 Type 2 diabetes mellitus without complications: Secondary | ICD-10-CM | POA: Diagnosis not present

## 2022-03-20 DIAGNOSIS — R6889 Other general symptoms and signs: Secondary | ICD-10-CM | POA: Diagnosis not present

## 2022-03-20 DIAGNOSIS — I10 Essential (primary) hypertension: Secondary | ICD-10-CM | POA: Diagnosis not present

## 2022-03-21 DIAGNOSIS — N2581 Secondary hyperparathyroidism of renal origin: Secondary | ICD-10-CM | POA: Diagnosis not present

## 2022-03-21 DIAGNOSIS — N186 End stage renal disease: Secondary | ICD-10-CM | POA: Diagnosis not present

## 2022-03-21 DIAGNOSIS — Z992 Dependence on renal dialysis: Secondary | ICD-10-CM | POA: Diagnosis not present

## 2022-03-24 DIAGNOSIS — N2581 Secondary hyperparathyroidism of renal origin: Secondary | ICD-10-CM | POA: Diagnosis not present

## 2022-03-24 DIAGNOSIS — N186 End stage renal disease: Secondary | ICD-10-CM | POA: Diagnosis not present

## 2022-03-24 DIAGNOSIS — Z992 Dependence on renal dialysis: Secondary | ICD-10-CM | POA: Diagnosis not present

## 2022-03-25 ENCOUNTER — Other Ambulatory Visit: Payer: Self-pay

## 2022-03-25 ENCOUNTER — Emergency Department (HOSPITAL_BASED_OUTPATIENT_CLINIC_OR_DEPARTMENT_OTHER): Payer: Medicare HMO

## 2022-03-25 ENCOUNTER — Emergency Department (HOSPITAL_COMMUNITY)
Admission: EM | Admit: 2022-03-25 | Discharge: 2022-03-26 | Disposition: A | Discharge status: Home / Self Care | Payer: Medicare HMO | Attending: Emergency Medicine | Admitting: Emergency Medicine

## 2022-03-25 ENCOUNTER — Encounter (HOSPITAL_COMMUNITY): Payer: Self-pay | Admitting: Pharmacy Technician

## 2022-03-25 DIAGNOSIS — N186 End stage renal disease: Secondary | ICD-10-CM

## 2022-03-25 DIAGNOSIS — T829XXA Unspecified complication of cardiac and vascular prosthetic device, implant and graft, initial encounter: Secondary | ICD-10-CM

## 2022-03-25 DIAGNOSIS — T82590A Other mechanical complication of surgically created arteriovenous fistula, initial encounter: Secondary | ICD-10-CM | POA: Diagnosis not present

## 2022-03-25 DIAGNOSIS — Z992 Dependence on renal dialysis: Secondary | ICD-10-CM | POA: Diagnosis not present

## 2022-03-25 LAB — COMPREHENSIVE METABOLIC PANEL
ALT: 17 U/L (ref 0–44)
AST: 17 U/L (ref 15–41)
Albumin: 3.7 g/dL (ref 3.5–5.0)
Alkaline Phosphatase: 83 U/L (ref 38–126)
Anion gap: 10 (ref 5–15)
BUN: 42 mg/dL — ABNORMAL HIGH (ref 8–23)
CO2: 31 mmol/L (ref 22–32)
Calcium: 9.2 mg/dL (ref 8.9–10.3)
Chloride: 92 mmol/L — ABNORMAL LOW (ref 98–111)
Creatinine, Ser: 5.12 mg/dL — ABNORMAL HIGH (ref 0.44–1.00)
GFR, Estimated: 8 mL/min — ABNORMAL LOW (ref >60)
Glucose, Bld: 118 mg/dL — ABNORMAL HIGH (ref 70–99)
Potassium: 4.2 mmol/L (ref 3.5–5.1)
Sodium: 133 mmol/L — ABNORMAL LOW (ref 135–145)
Total Bilirubin: 0.5 mg/dL (ref 0.3–1.2)
Total Protein: 6.8 g/dL (ref 6.5–8.1)

## 2022-03-25 LAB — CBC WITH DIFFERENTIAL/PLATELET
Abs Immature Granulocytes: 0.02 10*3/uL (ref 0.00–0.07)
Basophils Absolute: 0 10*3/uL (ref 0.0–0.1)
Basophils Relative: 0 %
Eosinophils Absolute: 0.2 10*3/uL (ref 0.0–0.5)
Eosinophils Relative: 5 %
HCT: 30.4 % — ABNORMAL LOW (ref 36.0–46.0)
Hemoglobin: 10.5 g/dL — ABNORMAL LOW (ref 12.0–15.0)
Immature Granulocytes: 0 %
Lymphocytes Relative: 23 %
Lymphs Abs: 1.2 10*3/uL (ref 0.7–4.0)
MCH: 30 pg (ref 26.0–34.0)
MCHC: 34.5 g/dL (ref 30.0–36.0)
MCV: 86.9 fL (ref 80.0–100.0)
Monocytes Absolute: 0.7 10*3/uL (ref 0.1–1.0)
Monocytes Relative: 13 %
Neutro Abs: 3.1 10*3/uL (ref 1.7–7.7)
Neutrophils Relative %: 59 %
Platelets: 156 10*3/uL (ref 150–400)
RBC: 3.5 MIL/uL — ABNORMAL LOW (ref 3.87–5.11)
RDW: 15.5 % (ref 11.5–15.5)
WBC: 5.2 10*3/uL (ref 4.0–10.5)
nRBC: 0 % (ref 0.0–0.2)

## 2022-03-25 NOTE — ED Triage Notes (Signed)
Pt here with reports of Fistula bleeding slowly since dialysis yesterday. Pt on eliquis.

## 2022-03-25 NOTE — ED Provider Triage Note (Signed)
Emergency Medicine Provider Triage Evaluation Note  ONALEE STEINBACH , a 76 y.o. female  was evaluated in triage.  Pt complains of fistula pain and bleeding. States that yesterday she completed a full course of dialysis and went home and noticed her port was leaking blood. She is on Elliquis. States it continued to bleed throughout the night and she was unable to get it to stop. Woke up this morning with pain in her fistula site.   Review of Systems  Positive:  Negative:   Physical Exam  BP (!) 154/74 (BP Location: Left Arm)   Pulse 65   Temp 98.3 F (36.8 C) (Oral)   Resp 20   LMP  (LMP Unknown)   SpO2 96%  Gen:   Awake, no distress   Resp:  Normal effort  MSK:   Moves extremities without difficulty  Other:  No active bleeding from fistula. Tenderness to palpation of fistula without palpable abnormalities. Radial pulse intact and 2+  Medical Decision Making  Medically screening exam initiated at 1:00 PM.  Appropriate orders placed.  Mikhayla Phillis Heckmann was informed that the remainder of the evaluation will be completed by another provider, this initial triage assessment does not replace that evaluation, and the importance of remaining in the ED until their evaluation is complete.     Bud Face, PA-C 03/25/22 1303

## 2022-03-25 NOTE — Progress Notes (Signed)
Upper extremity duplex dialysis AVG study completed.  Preliminary results relayed to [place], [place].  See CV Proc for preliminary results report.   Darlin Coco, RDMS, RVT

## 2022-03-26 DIAGNOSIS — N2581 Secondary hyperparathyroidism of renal origin: Secondary | ICD-10-CM | POA: Diagnosis not present

## 2022-03-26 DIAGNOSIS — N186 End stage renal disease: Secondary | ICD-10-CM | POA: Diagnosis not present

## 2022-03-26 DIAGNOSIS — Z992 Dependence on renal dialysis: Secondary | ICD-10-CM | POA: Diagnosis not present

## 2022-03-26 NOTE — ED Provider Notes (Signed)
[facility] EMERGENCY DEPARTMENT Provider Note   CSN: [id number] Arrival date & time: 03/25/22  1159     History  Chief Complaint  Patient presents with   Vascular Access Problem    Paula [name] is a 76 y.o. female.  Patient presents for evaluation of problems with her dialysis fistula.  Patient reports that she had continuous bleeding through the night last night after dialysis that day.  She presented today because of persistent bleeding and concerned that there might be a problem with the fistula.       Home Medications Prior to Admission medications   Medication Sig Start Date End Date Taking? Authorizing Provider  albuterol (PROVENTIL) (2.5 MG/3ML) 0.083% nebulizer solution Take 3 mLs (1 vial) by nebulization every 6 hours as needed for wheezing or shortness of breath. 01/07/22   Charlott Rakes, MD  acetaminophen (TYLENOL) 500 MG tablet Take 500 mg by mouth every 6 (six) hours as needed for fever, moderate pain or mild pain.    [provider]  albuterol (PROAIR HFA) 108 (90 Base) MCG/ACT inhaler Inhale 1-2 puffs into the lungs every 6 (six) hours as needed for wheezing or shortness of breath. 02/18/22   Charlott Rakes, MD  amLODipine (NORVASC) 5 MG tablet Take 1 tablet (5 mg total) by mouth daily. 02/25/22   Charlott Rakes, MD  apixaban (ELIQUIS) 5 MG TABS tablet Take 1 tablet by mouth 2 times daily. 02/26/22   Charlott Rakes, MD  aspirin EC 81 MG tablet Take 81 mg by mouth every evening.    [provider]  atorvastatin (LIPITOR) 80 MG tablet Take 1 tablet (80 mg total) by mouth daily. 10/01/21   Charlott Rakes, MD  calcium acetate (PHOSLO) 667 MG capsule Take 1 capsule by mouth 3 times daily 10/31/21   Penninger, Ria Comment, PA  carvedilol (COREG) 12.5 MG tablet Take 1 tablet (12.5 mg total) by mouth 2 (two) times daily with a meal. 10/01/21   Charlott Rakes, MD  dextromethorphan (DELSYM) 30 MG/5ML liquid Take 30 mg by mouth daily as  needed for cough. Patient not taking: Reported on 11/15/2021    [provider]  ethyl chloride spray Apply a small amount on skin immediately before cannulation 01/09/22   Elmarie Shiley, MD  fluticasone [facility]) 50 MCG/ACT nasal spray Place 2 sprays into both nostrils daily. Patient not taking: Reported on 11/13/2021 03/15/21   Collene Gobble, MD  gabapentin (NEURONTIN) 100 MG capsule Take 100 mg by mouth 3 (three) times daily. 09/05/21   [provider]  glipiZIDE (GLUCOTROL XL) 2.5 MG 24 hr tablet Take 1 tablet (2.5 mg total) by mouth daily with breakfast. Patient taking differently: Take 1.25 mg by mouth daily with breakfast. 10/31/21   Charlott Rakes, MD  glucose blood (ACCU-CHEK GUIDE) test strip USE AS INSTRUCTED 2 TIMES DAILY 03/12/22   Charlott Rakes, MD  guaiFENesin-dextromethorphan (ROBITUSSIN DM) 100-10 MG/5ML syrup Take 10 mLs by mouth every 4 (four) hours as needed for cough. Patient not taking: Reported on 11/13/2021 09/05/21   [name], MD  Homeopathic Products (EARACHE DROPS OT) Place 1 drop into both ears daily.    [provider]  isosorbide mononitrate (IMDUR) 60 MG 24 hr tablet TAKE 1 TABLET (60 MG TOTAL) BY MOUTH DAILY. Patient taking differently: Take 60 mg by mouth daily. 10/01/21 10/01/22  Charlott Rakes, MD  lidocaine-prilocaine (EMLA) cream Apply a small amount to dialysis access one hour prior to dialysis. Wrap with plastic wrap. 12/10/21  loratadine (CLARITIN) 10 MG tablet Take 1 tablet (10 mg total) by mouth daily. 03/15/21   Collene Gobble, MD  meclizine (ANTIVERT) 12.5 MG tablet Take 1 tablet (12.5 mg total) by mouth 2 (two) times daily as needed for dizziness. 12/29/21   [name] Sparrow, DO  Misc. Devices [employer].  Diagnosis - unstable gait 03/05/21   Charlott Rakes, MD  Misc. Devices MISC Fistula sleeve for R arm.  Diagnoses: End-stage renal disease on hemodialysis 01/09/22   Charlott Rakes, MD  Polyethyl Glycol-Propyl Glycol (SYSTANE OP) Place 1  drop into both eyes daily.    [provider]  torsemide (DEMADEX) 20 MG tablet Take 2 tablets by mouth 2 times daily 10/18/21   Edrick Oh, MD  Torsemide 40 MG TABS Take 80 mg by mouth 2 (two) times daily. Patient not taking: Reported on 11/13/2021 10/18/21   [name], MD  sodium chloride (OCEAN) 0.65 % SOLN nasal spray Place 2 sprays into both nostrils as needed for congestion. Patient not taking: No sig reported 03/21/19 02/21/21  Martyn Ehrich, NP      Allergies    Dust mite extract, Ace inhibitors, and Lovastatin    Review of Systems   Review of Systems  Physical Exam Updated Vital Signs BP (!) 183/68 (BP Location: Left Arm)   Pulse 77   Temp 98.1 F (36.7 C) (Oral)   Resp 17   LMP  (LMP Unknown)   SpO2 97%  Physical Exam Vitals and nursing note reviewed.  Constitutional:      General: She is not in acute distress.    Appearance: She is well-developed.  HENT:     Head: Normocephalic and atraumatic.     Mouth/Throat:     Mouth: Mucous membranes are moist.  Eyes:     General: Vision grossly intact. Gaze aligned appropriately.     Extraocular Movements: Extraocular movements intact.     Conjunctiva/sclera: Conjunctivae normal.  Cardiovascular:     Rate and Rhythm: Normal rate and regular rhythm.     Pulses: Normal pulses.     Heart sounds: Normal heart sounds, S1 normal and S2 normal. No murmur heard.    No friction rub. No gallop.  Pulmonary:     Effort: Pulmonary effort is normal. No respiratory distress.     Breath sounds: Normal breath sounds.  Abdominal:     General: Bowel sounds are normal.     Palpations: Abdomen is soft.     Tenderness: There is no abdominal tenderness. There is no guarding or rebound.     Hernia: No hernia is present.  Musculoskeletal:        General: No swelling.     Cervical back: Full passive range of motion without pain, normal range of motion and neck supple. No spinous process tenderness or muscular tenderness.  Normal range of motion.     Right lower leg: No edema.     Left lower leg: No edema.  Skin:    General: Skin is warm and dry.     Capillary Refill: Capillary refill takes less than 2 seconds.     Findings: No ecchymosis, erythema, rash or wound.     Comments: [place] of prior fistula access without any signs of infection.  No active bleeding.  Normal thrill palpable.  Neurological:     General: No focal deficit present.     Mental Status: She is alert and oriented to person, place, and time.     GCS: GCS  eye subscore is 4. GCS verbal subscore is 5. GCS motor subscore is 6.     Cranial Nerves: Cranial nerves 2-12 are intact.     Sensory: Sensation is intact.     Motor: Motor function is intact.     Coordination: Coordination is intact.  Psychiatric:        Attention and Perception: Attention normal.        Mood and Affect: Mood normal.        Speech: Speech normal.        Behavior: Behavior normal.     ED Results / Procedures / Treatments   Labs (all labs ordered are listed, but only abnormal results are displayed) Labs Reviewed  CBC WITH DIFFERENTIAL/PLATELET - Abnormal; Notable for the following components:      Result Value   RBC 3.50 (*)    Hemoglobin 10.5 (*)    HCT 30.4 (*)    All other components within normal limits  COMPREHENSIVE METABOLIC PANEL - Abnormal; Notable for the following components:   Sodium 133 (*)    Chloride 92 (*)    Glucose, Bld 118 (*)    BUN 42 (*)    Creatinine, Ser 5.12 (*)    GFR, Estimated 8 (*)    All other components within normal limits    EKG None  Radiology VAS US DUPLEX DIALYSIS ACCESS (AVF, AVG)  Result Date: 03/25/2022 DIALYSIS ACCESS Patient Name:  PANDORA MCCRACKIN  Date of Exam:   03/25/2022 Medical Rec #: [id number]            Accession #:    [id number] Date of Birth: 12-22-1945            Patient Gender: F Patient Age:   5 years Exam Location:  [facility] Procedure:      VAS [place] (AVF, AVG)  Referring Phys: HALEY SAGE --------------------------------------------------------------------------------  Reason for Exam: Continuous bleeding from AVG at site of most recent access. Access Site: Right Upper Extremity. Access Type: Upper arm loop AVG. History: Patient currently on Eliquis. Comparison Study: 11-27-2021 Prior duplex dialysis access Performing Technologist: Darlin Coco RDMS, RVT  Examination Guidelines: A complete evaluation includes B-mode imaging, spectral Doppler, color Doppler, and power Doppler as needed of all accessible portions of each vessel. Unilateral testing is considered an integral part of a complete examination. Limited examinations for reoccurring indications may be performed as noted.  Findings:   +--------------------+----------+-----------------+---------+ AVG                 PSV (cm/s)Flow Vol (mL/min)Describe  +--------------------+----------+-----------------+---------+ Native artery inflow   442          1849                 +--------------------+----------+-----------------+---------+ Arterial anastomosis   495                               +--------------------+----------+-----------------+---------+ Prox graft             435                               +--------------------+----------+-----------------+---------+ Mid graft              196                               +--------------------+----------+-----------------+---------+  Distal graft           170                               +--------------------+----------+-----------------+---------+ Venous anastomosis     697                     stenotic  +--------------------+----------+-----------------+---------+ Venous outflow         266                     turbulent +--------------------+----------+-----------------+---------+  Summary: Arteriovenous graft- elevated velocities noted at the venous anastomosis previously on 11-27-2021 are increased on today's examination.  Turbulent flow observed distally at the venous outflow. *See table(s) above for measurements and observations.  Diagnosing physician: Harold Barban MD Electronically signed by Harold Barban MD on 03/25/2022 at 8:10:37 PM.    --------------------------------------------------------------------------------   Final     Procedures Procedures    Medications Ordered in ED Medications - No data to display  ED Course/ Medical Decision Making/ A&P                           Medical Decision Making  Patient's labs are unremarkable.  Does not need emergent dialysis.  No significant anemia.  Fistula study shows good flow.  Examination reveals no signs of infection, no hematoma.  No active bleeding.  Patient reassured, can go to dialysis later today.        Final Clinical Impression(s) / ED Diagnoses Final diagnoses:  Complication of arteriovenous dialysis fistula, initial encounter    Rx / DC Orders ED Discharge Orders     None         Timothey Dahlstrom, Gwenyth Allegra, MD 03/26/22 [phone]

## 2022-03-26 NOTE — Discharge Instructions (Signed)
If you have any slight bleeding overnight, apply pressure to the area.  Return to the ER for heavy bleeding.  You may go to dialysis today.

## 2022-03-28 DIAGNOSIS — Z992 Dependence on renal dialysis: Secondary | ICD-10-CM | POA: Diagnosis not present

## 2022-03-28 DIAGNOSIS — N186 End stage renal disease: Secondary | ICD-10-CM | POA: Diagnosis not present

## 2022-03-28 DIAGNOSIS — N2581 Secondary hyperparathyroidism of renal origin: Secondary | ICD-10-CM | POA: Diagnosis not present

## 2022-03-31 ENCOUNTER — Other Ambulatory Visit (HOSPITAL_COMMUNITY): Payer: Self-pay

## 2022-03-31 ENCOUNTER — Other Ambulatory Visit: Payer: Self-pay | Admitting: Emergency Medicine

## 2022-03-31 DIAGNOSIS — Z992 Dependence on renal dialysis: Secondary | ICD-10-CM | POA: Diagnosis not present

## 2022-03-31 DIAGNOSIS — N2581 Secondary hyperparathyroidism of renal origin: Secondary | ICD-10-CM | POA: Diagnosis not present

## 2022-03-31 DIAGNOSIS — N186 End stage renal disease: Secondary | ICD-10-CM | POA: Diagnosis not present

## 2022-03-31 MED ORDER — LORATADINE 10 MG PO TABS
10.0000 mg | ORAL_TABLET | Freq: Every day | ORAL | 11 refills | Status: DC
  Filled 2022-03-31: qty 30, 30d supply, fill #0
  Filled 2022-04-28: qty 30, 30d supply, fill #1
  Filled 2022-05-26: qty 30, 30d supply, fill #2
  Filled 2022-07-01: qty 30, 30d supply, fill #3
  Filled 2022-07-31: qty 30, 30d supply, fill #4

## 2022-04-01 ENCOUNTER — Ambulatory Visit: Payer: Self-pay

## 2022-04-01 NOTE — Telephone Encounter (Signed)
  Chief Complaint: SOB - fluid in lungs Symptoms: Fluid in lungs, cough Frequency: ongoing Pertinent Negatives: Patient denies chest pain Disposition: [] ED /[] Urgent Care (no appt availability in office) / [] Appointment(In office/virtual)/ []  [place] [facility]/ [] Home Care/ [x] Refused Recommended Disposition /[]  Mobile Bus/ []  Follow-up with PCP Additional Notes: Pt has had ongoing breathing issues. Pt was at dialysis and they stated that pt had fluid in lungs. Dialysis tried to remove fluid, but BP dropped so low that pt was inverted to raise BP. Dialysis recommended OV. NT suggested ED as they could remove fluid and monitor pt BP. PT would like to be seen by Dr. Margarita Rana today.     Reason for Disposition  [1] MILD difficulty breathing (e.g., minimal/no SOB at rest, SOB with walking, pulse <100) AND [2] NEW-onset or WORSE than normal  Answer Assessment - Initial Assessment Questions 1. RESPIRATORY STATUS: "Describe your breathing?" (e.g., wheezing, shortness of breath, unable to speak, severe coughing)      Cough, SOB 2. ONSET: "When did this breathing problem begin?"      ongoing 3. PATTERN "Does the difficult breathing come and go, or has it been constant since it started?"      Comes and goes 4. SEVERITY: "How bad is your breathing?" (e.g., mild, moderate, severe)    - MILD: No SOB at rest, mild SOB with walking, speaks normally in sentences, can lie down, no retractions, pulse < 100.    - MODERATE: SOB at rest, SOB with minimal exertion and prefers to sit, cannot lie down flat, speaks in phrases, mild retractions, audible wheezing, pulse 100-120.    - SEVERE: Very SOB at rest, speaks in single words, struggling to breathe, sitting hunched forward, retractions, pulse > 120      moderate 5. RECURRENT SYMPTOM: "Have you had difficulty breathing before?" If Yes, ask: "When was the last time?" and "What happened that time?"      Yes - ongoing  6. CARDIAC HISTORY: "Do you  have any history of heart disease?" (e.g., heart attack, angina, bypass surgery, angioplasty)       7. LUNG HISTORY: "Do you have any history of lung disease?"  (e.g., pulmonary embolus, asthma, emphysema)     Fluid in lungs 8. CAUSE: "What do you think is causing the breathing problem?"      Unsure 9. OTHER SYMPTOMS: "Do you have any other symptoms? (e.g., dizziness, runny nose, cough, chest pain, fever)     Chest pain 10. O2 SATURATION MONITOR:  "Do you use an oxygen saturation monitor (pulse oximeter) at home?" If Yes, ask: "What is your reading (oxygen level) today?" "What is your usual oxygen saturation reading?" (e.g., 95%)        11. PREGNANCY: "Is there any chance you are pregnant?" "When was your last menstrual period?"       no 12. TRAVEL: "Have you traveled out of the country in the last month?" (e.g., travel history, exposures)  Protocols used: Breathing Difficulty-A-AH

## 2022-04-02 DIAGNOSIS — Z992 Dependence on renal dialysis: Secondary | ICD-10-CM | POA: Diagnosis not present

## 2022-04-02 DIAGNOSIS — N186 End stage renal disease: Secondary | ICD-10-CM | POA: Diagnosis not present

## 2022-04-02 DIAGNOSIS — N2581 Secondary hyperparathyroidism of renal origin: Secondary | ICD-10-CM | POA: Diagnosis not present

## 2022-04-02 NOTE — Telephone Encounter (Signed)
Pt has been scheduled to see PCP on 04/17/2022

## 2022-04-04 ENCOUNTER — Other Ambulatory Visit (HOSPITAL_COMMUNITY): Payer: Self-pay

## 2022-04-04 DIAGNOSIS — N2581 Secondary hyperparathyroidism of renal origin: Secondary | ICD-10-CM | POA: Diagnosis not present

## 2022-04-04 DIAGNOSIS — Z992 Dependence on renal dialysis: Secondary | ICD-10-CM | POA: Diagnosis not present

## 2022-04-04 DIAGNOSIS — N186 End stage renal disease: Secondary | ICD-10-CM | POA: Diagnosis not present

## 2022-04-05 ENCOUNTER — Emergency Department (HOSPITAL_COMMUNITY)
Admission: EM | Admit: 2022-04-05 | Discharge: 2022-04-05 | Disposition: A | Discharge status: Home / Self Care | Payer: Medicare HMO | Attending: Emergency Medicine | Admitting: Emergency Medicine

## 2022-04-05 ENCOUNTER — Emergency Department (HOSPITAL_COMMUNITY): Payer: Medicare HMO

## 2022-04-05 ENCOUNTER — Other Ambulatory Visit: Payer: Self-pay

## 2022-04-05 DIAGNOSIS — I4430 Unspecified atrioventricular block: Secondary | ICD-10-CM | POA: Diagnosis not present

## 2022-04-05 DIAGNOSIS — E1122 Type 2 diabetes mellitus with diabetic chronic kidney disease: Secondary | ICD-10-CM | POA: Diagnosis not present

## 2022-04-05 DIAGNOSIS — I5032 Chronic diastolic (congestive) heart failure: Secondary | ICD-10-CM | POA: Insufficient documentation

## 2022-04-05 DIAGNOSIS — J4521 Mild intermittent asthma with (acute) exacerbation: Secondary | ICD-10-CM | POA: Diagnosis not present

## 2022-04-05 DIAGNOSIS — Z20822 Contact with and (suspected) exposure to covid-19: Secondary | ICD-10-CM | POA: Diagnosis not present

## 2022-04-05 DIAGNOSIS — E1140 Type 2 diabetes mellitus with diabetic neuropathy, unspecified: Secondary | ICD-10-CM | POA: Diagnosis not present

## 2022-04-05 DIAGNOSIS — J810 Acute pulmonary edema: Secondary | ICD-10-CM | POA: Diagnosis not present

## 2022-04-05 DIAGNOSIS — I132 Hypertensive heart and chronic kidney disease with heart failure and with stage 5 chronic kidney disease, or end stage renal disease: Secondary | ICD-10-CM | POA: Diagnosis not present

## 2022-04-05 DIAGNOSIS — N186 End stage renal disease: Secondary | ICD-10-CM | POA: Insufficient documentation

## 2022-04-05 DIAGNOSIS — I440 Atrioventricular block, first degree: Secondary | ICD-10-CM | POA: Diagnosis not present

## 2022-04-05 DIAGNOSIS — R0602 Shortness of breath: Secondary | ICD-10-CM | POA: Insufficient documentation

## 2022-04-05 DIAGNOSIS — I480 Paroxysmal atrial fibrillation: Secondary | ICD-10-CM | POA: Diagnosis not present

## 2022-04-05 DIAGNOSIS — I959 Hypotension, unspecified: Secondary | ICD-10-CM | POA: Diagnosis not present

## 2022-04-05 DIAGNOSIS — J45909 Unspecified asthma, uncomplicated: Secondary | ICD-10-CM | POA: Diagnosis not present

## 2022-04-05 DIAGNOSIS — Z7982 Long term (current) use of aspirin: Secondary | ICD-10-CM | POA: Insufficient documentation

## 2022-04-05 DIAGNOSIS — Z7901 Long term (current) use of anticoagulants: Secondary | ICD-10-CM | POA: Insufficient documentation

## 2022-04-05 DIAGNOSIS — R5383 Other fatigue: Secondary | ICD-10-CM | POA: Insufficient documentation

## 2022-04-05 DIAGNOSIS — R079 Chest pain, unspecified: Secondary | ICD-10-CM | POA: Diagnosis not present

## 2022-04-05 LAB — CBC
HCT: 33 % — ABNORMAL LOW (ref 36.0–46.0)
Hemoglobin: 11.3 g/dL — ABNORMAL LOW (ref 12.0–15.0)
MCH: 29.3 pg (ref 26.0–34.0)
MCHC: 34.2 g/dL (ref 30.0–36.0)
MCV: 85.5 fL (ref 80.0–100.0)
Platelets: 209 10*3/uL (ref 150–400)
RBC: 3.86 MIL/uL — ABNORMAL LOW (ref 3.87–5.11)
RDW: 14.8 % (ref 11.5–15.5)
WBC: 7.9 10*3/uL (ref 4.0–10.5)
nRBC: 0 % (ref 0.0–0.2)

## 2022-04-05 LAB — RESP PANEL BY RT-PCR (FLU A&B, COVID) ARPGX2
Influenza A by PCR: NEGATIVE
Influenza B by PCR: NEGATIVE
SARS Coronavirus 2 by RT PCR: NEGATIVE

## 2022-04-05 LAB — BASIC METABOLIC PANEL
Anion gap: 16 — ABNORMAL HIGH (ref 5–15)
BUN: 43 mg/dL — ABNORMAL HIGH (ref 8–23)
CO2: 25 mmol/L (ref 22–32)
Calcium: 9.5 mg/dL (ref 8.9–10.3)
Chloride: 93 mmol/L — ABNORMAL LOW (ref 98–111)
Creatinine, Ser: 5.21 mg/dL — ABNORMAL HIGH (ref 0.44–1.00)
GFR, Estimated: 8 mL/min — ABNORMAL LOW (ref >60)
Glucose, Bld: 186 mg/dL — ABNORMAL HIGH (ref 70–99)
Potassium: 4 mmol/L (ref 3.5–5.1)
Sodium: 134 mmol/L — ABNORMAL LOW (ref 135–145)

## 2022-04-05 LAB — TROPONIN I (HIGH SENSITIVITY)
Troponin I (High Sensitivity): 24 ng/L — ABNORMAL HIGH (ref <18)
Troponin I (High Sensitivity): 26 ng/L — ABNORMAL HIGH (ref <18)

## 2022-04-05 NOTE — ED Provider Notes (Signed)
This patient is a 76 year old female on dialysis for end-stage renal disease, she is anticoagulated on Eliquis, she takes high cholesterol medicine, antihypertensives, antidiabetic medications and takes dialysis Monday Wednesday and Friday.  She did dialyze yesterday.  She presents today with a complaint of shortness of breath nausea hyperglycemia, occasional cough, no fever, no edema.  On my exam the patient is well-appearing with clear lungs, normal fistula in the right upper extremity with no redness tenderness or heat, good thrill.  She has a nontender abdomen, clear heart sounds with a soft murmur, she has no JVD in the upright position or the semirecumbent position.  She has no peripheral edema.  Her vital signs of been unremarkable with a heart rate of 63 and a blood pressure of 123/55.  At this time the patient will be evaluated for the cause of her symptoms though she does not appear to be in acute distress and has no pneumonia on x-ray.  Final diagnoses:  None      Noemi Chapel, MD 04/06/22 2111

## 2022-04-05 NOTE — ED Notes (Signed)
Pt verbalized understanding of d/c instructions, meds, and followup care. Denies questions. VSS, no distress noted. Assisted to wheelchair and to exit with all belongings.

## 2022-04-05 NOTE — ED Notes (Signed)
Pt ambulation stats were above 96%. Pt stated that she does not feel SOB while walking, only while she is sleeping.

## 2022-04-05 NOTE — Discharge Instructions (Addendum)
You were seen in the emergency room today for shortness of breath.  We did a chest x-ray that looked normal.  Your blood work looked good and were consistent with your normal blood work.  We think you are safe to go home at this time.  If you feel suddenly more short of breath or have chest pain please come back to the emergency department.  Otherwise we would like you to follow-up with your primary care doctor in the next several days.

## 2022-04-05 NOTE — ED Triage Notes (Signed)
Patient BIB [facility] from home with complaints of shortness of breath since this AM. Dialysis patient MWF, access to the R. In triage patient is vomiting and BP 81/49. Messaged charge RN for available room.

## 2022-04-05 NOTE — ED Notes (Signed)
Walk about room without supplemental O2. Sat stays above 95% with no signs of distress.

## 2022-04-05 NOTE — ED Provider Notes (Signed)
[facility] EMERGENCY DEPARTMENT Provider Note   CSN: [id number] Arrival date & time: 04/05/22  1411     History  Chief Complaint  Patient presents with   Shortness of [place] is a 76 y.o. female with past medical history of ESRD on Monday Wednesday Friday dialysis, hypertension, hyperlipidemia, CHF, diabetes, who presents to the emergency department with acute on chronic shortness of breath, cough, and generalized fatigue.  She states that her primary complaint is that for the past 2 to 3 days she has had slowly progressive worsening shortness of breath.  She has also had a cough that has more sputum than normal.  She has had no fevers or chills.  She has been compliant with her Monday Wednesday Friday dialysis.  Patient is also complaining of nausea and vomiting in the absence of abdominal pain.  She has vomited twice today.  She denies fevers or chills.  She does not know of any sick contacts.  She states that she has been compliant with her Eliquis.  She states that she has intermittent chest pain approximately once every other day and that this is unchanged in frequency or severity recently.  She has no current chest pain at this time.   Shortness of Breath Associated symptoms: no abdominal pain, no chest pain, no cough, no ear pain, no fever, no rash, no sore throat and no vomiting    Past Medical History:  Diagnosis Date   Allergic rhinitis    Arthritis    Asthma    Brain aneurysm    Chronic kidney disease    Cough    Diabetes mellitus    Diastolic CHF, chronic ([place]) 10/11/2011   GERD (gastroesophageal reflux disease)    History of colon polyps 2012   tubular adenoma    Hyperlipidemia    Hypertension    Neuropathy 10/11/2011   PONV (postoperative nausea and vomiting)    one time after lymph node surgery       Home Medications Prior to Admission medications   Medication Sig Start Date End Date Taking? Authorizing Provider   albuterol (PROVENTIL) (2.5 MG/3ML) 0.083% nebulizer solution Take 3 mLs (1 vial) by nebulization every 6 hours as needed for wheezing or shortness of breath. 01/07/22   Charlott Rakes, MD  acetaminophen (TYLENOL) 500 MG tablet Take 500 mg by mouth every 6 (six) hours as needed for fever, moderate pain or mild pain.    [provider]  albuterol (PROAIR HFA) 108 (90 Base) MCG/ACT inhaler Inhale 1-2 puffs into the lungs every 6 (six) hours as needed for wheezing or shortness of breath. 02/18/22   Charlott Rakes, MD  amLODipine (NORVASC) 5 MG tablet Take 1 tablet (5 mg total) by mouth daily. 02/25/22   Charlott Rakes, MD  apixaban (ELIQUIS) 5 MG TABS tablet Take 1 tablet by mouth 2 times daily. 02/26/22   Charlott Rakes, MD  aspirin EC 81 MG tablet Take 81 mg by mouth every evening.    [provider]  atorvastatin (LIPITOR) 80 MG tablet Take 1 tablet (80 mg total) by mouth daily. 10/01/21   Charlott Rakes, MD  calcium acetate (PHOSLO) 667 MG capsule Take 1 capsule by mouth 3 times daily 10/31/21   Penninger, Ria Comment, PA  carvedilol (COREG) 12.5 MG tablet Take 1 tablet (12.5 mg total) by mouth 2 (two) times daily with a meal. 10/01/21   Charlott Rakes, MD  dextromethorphan (DELSYM) 30 MG/5ML liquid Take 30 mg by  mouth daily as needed for cough. Patient not taking: Reported on 11/15/2021    [provider]  ethyl chloride spray Apply a small amount on skin immediately before cannulation 01/09/22   Elmarie Shiley, MD  fluticasone [facility]) 50 MCG/ACT nasal spray Place 2 sprays into both nostrils daily. Patient not taking: Reported on 11/13/2021 03/15/21   Collene Gobble, MD  gabapentin (NEURONTIN) 100 MG capsule Take 100 mg by mouth 3 (three) times daily. 09/05/21   [provider]  glipiZIDE (GLUCOTROL XL) 2.5 MG 24 hr tablet Take 1 tablet (2.5 mg total) by mouth daily with breakfast. Patient taking differently: Take 1.25 mg by mouth daily with breakfast. 10/31/21   Charlott Rakes, MD  glucose blood (ACCU-CHEK GUIDE) test strip USE AS INSTRUCTED 2 TIMES DAILY 03/12/22   Charlott Rakes, MD  guaiFENesin-dextromethorphan (ROBITUSSIN DM) 100-10 MG/5ML syrup Take 10 mLs by mouth every 4 (four) hours as needed for cough. Patient not taking: Reported on 11/13/2021 09/05/21   [name], MD  Homeopathic Products (EARACHE DROPS OT) Place 1 drop into both ears daily.    [provider]  isosorbide mononitrate (IMDUR) 60 MG 24 hr tablet TAKE 1 TABLET (60 MG TOTAL) BY MOUTH DAILY. Patient taking differently: Take 60 mg by mouth daily. 10/01/21 10/01/22  Charlott Rakes, MD  lidocaine-prilocaine (EMLA) cream Apply a small amount to dialysis access one hour prior to dialysis. Wrap with plastic wrap. 12/10/21     loratadine (CLARITIN) 10 MG tablet Take 1 tablet (10 mg total) by mouth daily. 03/31/22   Collene Gobble, MD  meclizine (ANTIVERT) 12.5 MG tablet Take 1 tablet (12.5 mg total) by mouth 2 (two) times daily as needed for dizziness. 12/29/21   [name] Sparrow, DO  Misc. Devices [employer].  Diagnosis - unstable gait 03/05/21   Charlott Rakes, MD  Misc. Devices MISC Fistula sleeve for R arm.  Diagnoses: End-stage renal disease on hemodialysis 01/09/22   Charlott Rakes, MD  Polyethyl Glycol-Propyl Glycol (SYSTANE OP) Place 1 drop into both eyes daily.    [provider]  torsemide (DEMADEX) 20 MG tablet Take 2 tablets by mouth 2 times daily 10/18/21   Edrick Oh, MD  Torsemide 40 MG TABS Take 80 mg by mouth 2 (two) times daily. Patient not taking: Reported on 11/13/2021 10/18/21   [name], MD  sodium chloride (OCEAN) 0.65 % SOLN nasal spray Place 2 sprays into both nostrils as needed for congestion. Patient not taking: No sig reported 03/21/19 02/21/21  Martyn Ehrich, NP      Allergies    Dust mite extract, Ace inhibitors, and Lovastatin    Review of Systems   Review of Systems  Constitutional:  Positive for fatigue. Negative for chills and fever.  HENT:   Negative for ear pain and sore throat.   Eyes:  Negative for pain and visual disturbance.  Respiratory:  Positive for shortness of breath. Negative for cough.   Cardiovascular:  Negative for chest pain and palpitations.  Gastrointestinal:  Negative for abdominal pain and vomiting.  Genitourinary:  Negative for dysuria and hematuria.  Musculoskeletal:  Negative for arthralgias and back pain.  Skin:  Negative for color change and rash.  Neurological:  Negative for seizures and syncope.  All other systems reviewed and are negative.   Physical Exam Updated Vital Signs BP (!) 81/49 (BP Location: Left Arm)   Pulse (!) 57   Temp 97.8 F (36.6 C) (Oral)   Resp 16  LMP  (LMP Unknown)   SpO2 93%  Physical Exam Vitals and nursing note reviewed.  Constitutional:      General: She is not in acute distress.    Appearance: She is well-developed.     Comments: Upon entering the exam room with the patient is lying in bed awake and alert.  She appears chronically but not acutely ill.  HENT:     Head: Normocephalic and atraumatic.  Eyes:     Conjunctiva/sclera: Conjunctivae normal.  Cardiovascular:     Rate and Rhythm: Normal rate and regular rhythm.     Heart sounds: No murmur heard.    Comments: Regular rate and rhythm no murmurs or gallops. Pulmonary:     Effort: Pulmonary effort is normal. No respiratory distress.     Breath sounds: Normal breath sounds.     Comments: Lungs are completely clear to auscultation bilaterally.  She is saturating in the mid to low 90s on room air.  She is not tachypneic and is speaking in full sentences without difficulty. Abdominal:     Palpations: Abdomen is soft.     Tenderness: There is no abdominal tenderness.     Comments: Soft nondistended nontender.  No flank tenderness.  Musculoskeletal:        General: No swelling.     Cervical back: Neck supple.     Comments: Trace edema to the lower extremities which are symmetric in appearance.  Skin:     General: Skin is warm and dry.     Capillary Refill: Capillary refill takes less than 2 seconds.  Neurological:     Mental Status: She is alert.     Comments: Fully alert and oriented moving all extremity spontaneously grossly neurologically intact.  Psychiatric:        Mood and Affect: Mood normal.     ED Results / Procedures / Treatments   Labs (all labs ordered are listed, but only abnormal results are displayed) Labs Reviewed  CBC - Abnormal; Notable for the following components:      Result Value   RBC 3.86 (*)    Hemoglobin 11.3 (*)    HCT 33.0 (*)    All other components within normal limits  BASIC METABOLIC PANEL  TROPONIN I (HIGH SENSITIVITY)    EKG None  Radiology No results found.  Procedures Procedures    Medications Ordered in ED Medications - No data to display  ED Course/ Medical Decision Making/ A&P Clinical Course as of 04/05/22 1539  Sat Apr 05, 2022  1538 RDW: 14.8 [JG]    Clinical Course User Index [JG] Levie Heritage, MD                           Medical Decision Making Amount and/or Complexity of Data Reviewed Labs: ordered. Decision-making details documented in ED Course. Radiology: ordered.   Patient presents the emergency department initially hypotensive to the 80s over 40s with spontaneous correction to the 120s over 60s, in no acute distress with a fairly benign physical exam as above in the setting of her generalized fatigue and acute on chronic shortness of breath.  Differential diagnosis includes viral syndrome such as COVID versus less likely pulmonary embolism in the setting of her compliance with Eliquis versus less likely ACS in the absence of chest pain currently.  Do not feel that the patient has an acute intra-abdominal process such as abscess versus biliary pathology versus appendicitis or pyelonephritis given the benign  nature of her abdominal exam and absence of abdominal pain.  I have personally reviewed and interpreted  the patient's chest x-ray which was grossly unremarkable.  No pulmonary edema or consolidative process.  I personally reviewed and interpreted patient's EKG which was grossly unremarkable.  Normal sinus rhythm.  No ischemic phenomena identified.   Ambulatory pulse ox 95% on room air.  Patient's troponins of 24 and 26 are nonconcerning for acute ischemic event especially in the setting of her benign EKG.    Patient was discharged with instructions to follow-up with her primary care physician in the next place.  Given strict return precautions for suddenly worsening shortness of breath or chest pain.          Final Clinical Impression(s) / ED Diagnoses Final diagnoses:  Shortness of breath    Rx / DC Orders ED Discharge Orders     None         Levie Heritage, MD 04/05/22 Remonia Richter    Noemi Chapel, MD 04/06/22 2111

## 2022-04-07 DIAGNOSIS — N186 End stage renal disease: Secondary | ICD-10-CM | POA: Diagnosis not present

## 2022-04-07 DIAGNOSIS — Z992 Dependence on renal dialysis: Secondary | ICD-10-CM | POA: Diagnosis not present

## 2022-04-07 DIAGNOSIS — N2581 Secondary hyperparathyroidism of renal origin: Secondary | ICD-10-CM | POA: Diagnosis not present

## 2022-04-09 DIAGNOSIS — N2581 Secondary hyperparathyroidism of renal origin: Secondary | ICD-10-CM | POA: Diagnosis not present

## 2022-04-09 DIAGNOSIS — N186 End stage renal disease: Secondary | ICD-10-CM | POA: Diagnosis not present

## 2022-04-09 DIAGNOSIS — Z992 Dependence on renal dialysis: Secondary | ICD-10-CM | POA: Diagnosis not present

## 2022-04-10 DIAGNOSIS — N186 End stage renal disease: Secondary | ICD-10-CM | POA: Diagnosis not present

## 2022-04-10 DIAGNOSIS — Z992 Dependence on renal dialysis: Secondary | ICD-10-CM | POA: Diagnosis not present

## 2022-04-10 DIAGNOSIS — I129 Hypertensive chronic kidney disease with stage 1 through stage 4 chronic kidney disease, or unspecified chronic kidney disease: Secondary | ICD-10-CM | POA: Diagnosis not present

## 2022-04-11 DIAGNOSIS — Z992 Dependence on renal dialysis: Secondary | ICD-10-CM | POA: Diagnosis not present

## 2022-04-11 DIAGNOSIS — N186 End stage renal disease: Secondary | ICD-10-CM | POA: Diagnosis not present

## 2022-04-11 DIAGNOSIS — N2581 Secondary hyperparathyroidism of renal origin: Secondary | ICD-10-CM | POA: Diagnosis not present

## 2022-04-14 DIAGNOSIS — N2581 Secondary hyperparathyroidism of renal origin: Secondary | ICD-10-CM | POA: Diagnosis not present

## 2022-04-14 DIAGNOSIS — Z992 Dependence on renal dialysis: Secondary | ICD-10-CM | POA: Diagnosis not present

## 2022-04-14 DIAGNOSIS — N186 End stage renal disease: Secondary | ICD-10-CM | POA: Diagnosis not present

## 2022-04-16 ENCOUNTER — Telehealth: Payer: Self-pay | Admitting: Family Medicine

## 2022-04-16 DIAGNOSIS — N186 End stage renal disease: Secondary | ICD-10-CM | POA: Diagnosis not present

## 2022-04-16 DIAGNOSIS — Z992 Dependence on renal dialysis: Secondary | ICD-10-CM | POA: Diagnosis not present

## 2022-04-16 DIAGNOSIS — N2581 Secondary hyperparathyroidism of renal origin: Secondary | ICD-10-CM | POA: Diagnosis not present

## 2022-04-16 NOTE — Telephone Encounter (Signed)
Routing to PCP for review.

## 2022-04-16 NOTE — Telephone Encounter (Signed)
Caller requesting on patient behalf orders for Nepro and incontinence supplies specifically briefs due to patient experiencing urinary incontinence. Caller states this would be covered by [employer].Please fax orders to  [phone].  Caller would like a follow up call.

## 2022-04-17 ENCOUNTER — Ambulatory Visit: Payer: Medicare HMO | Admitting: Family Medicine

## 2022-04-17 MED ORDER — MISC. DEVICES MISC: 11 refills | Status: DC

## 2022-04-17 MED ORDER — MISC. DEVICES MISC: 11 refills | Status: AC

## 2022-04-17 NOTE — Telephone Encounter (Signed)
Done

## 2022-04-17 NOTE — Telephone Encounter (Signed)
Noted  

## 2022-04-18 DIAGNOSIS — Z992 Dependence on renal dialysis: Secondary | ICD-10-CM | POA: Diagnosis not present

## 2022-04-18 DIAGNOSIS — N186 End stage renal disease: Secondary | ICD-10-CM | POA: Diagnosis not present

## 2022-04-18 DIAGNOSIS — N2581 Secondary hyperparathyroidism of renal origin: Secondary | ICD-10-CM | POA: Diagnosis not present

## 2022-04-21 DIAGNOSIS — N186 End stage renal disease: Secondary | ICD-10-CM | POA: Diagnosis not present

## 2022-04-21 DIAGNOSIS — Z992 Dependence on renal dialysis: Secondary | ICD-10-CM | POA: Diagnosis not present

## 2022-04-21 DIAGNOSIS — N2581 Secondary hyperparathyroidism of renal origin: Secondary | ICD-10-CM | POA: Diagnosis not present

## 2022-04-23 DIAGNOSIS — N186 End stage renal disease: Secondary | ICD-10-CM | POA: Diagnosis not present

## 2022-04-23 DIAGNOSIS — N2581 Secondary hyperparathyroidism of renal origin: Secondary | ICD-10-CM | POA: Diagnosis not present

## 2022-04-23 DIAGNOSIS — Z992 Dependence on renal dialysis: Secondary | ICD-10-CM | POA: Diagnosis not present

## 2022-04-24 ENCOUNTER — Encounter: Payer: Self-pay | Admitting: Family Medicine

## 2022-04-25 DIAGNOSIS — N2581 Secondary hyperparathyroidism of renal origin: Secondary | ICD-10-CM | POA: Diagnosis not present

## 2022-04-25 DIAGNOSIS — Z992 Dependence on renal dialysis: Secondary | ICD-10-CM | POA: Diagnosis not present

## 2022-04-25 DIAGNOSIS — N186 End stage renal disease: Secondary | ICD-10-CM | POA: Diagnosis not present

## 2022-04-28 ENCOUNTER — Other Ambulatory Visit (HOSPITAL_COMMUNITY): Payer: Self-pay

## 2022-04-28 ENCOUNTER — Other Ambulatory Visit: Payer: Self-pay | Admitting: Family Medicine

## 2022-04-28 DIAGNOSIS — Z992 Dependence on renal dialysis: Secondary | ICD-10-CM | POA: Diagnosis not present

## 2022-04-28 DIAGNOSIS — N2581 Secondary hyperparathyroidism of renal origin: Secondary | ICD-10-CM | POA: Diagnosis not present

## 2022-04-28 DIAGNOSIS — N186 End stage renal disease: Secondary | ICD-10-CM | POA: Diagnosis not present

## 2022-04-29 ENCOUNTER — Other Ambulatory Visit (HOSPITAL_COMMUNITY): Payer: Self-pay

## 2022-04-29 MED ORDER — GLIPIZIDE ER 2.5 MG PO TB24
2.5000 mg | ORAL_TABLET | Freq: Every day | ORAL | 0 refills | Status: DC
  Filled 2022-04-29: qty 30, 30d supply, fill #0

## 2022-04-29 MED ORDER — GABAPENTIN 100 MG PO CAPS
100.0000 mg | ORAL_CAPSULE | Freq: Three times a day (TID) | ORAL | 1 refills | Status: DC
  Filled 2022-04-29 – 2022-07-20 (×4): qty 90, 30d supply, fill #0
  Filled 2022-09-01: qty 90, 30d supply, fill #1

## 2022-04-29 NOTE — Telephone Encounter (Signed)
Requested medications are due for refill today.  yes  Requested medications are on the active medications list.  yes  Last refill. 10/31/2021 #30 3 refills  Future visit scheduled.   no  Notes to clinic.  Un-reviewed abnormal labs.    Requested Prescriptions  Pending Prescriptions Disp Refills   glipiZIDE (GLUCOTROL XL) 2.5 MG 24 hr tablet 30 tablet 3    Sig: Take 1 tablet (2.5 mg total) by mouth daily with breakfast.     Endocrinology:  Diabetes - Sulfonylureas Failed - 04/28/2022  5:22 PM      Failed - HBA1C is between 0 and 7.9 and within 180 days    HbA1c, POC (controlled diabetic range)  Date Value Ref Range Status  06/06/2021 6.9 0.0 - 7.0 % Final   Hgb A1c MFr Bld  Date Value Ref Range Status  10/15/2021 7.6 (H) 4.8 - 5.6 % Final    Comment:    (NOTE) Pre diabetes:          5.7%-6.4%  Diabetes:              >6.4%  Glycemic control for   <7.0% adults with diabetes          Failed - Cr in normal range and within 360 days    Creat  Date Value Ref Range Status  06/27/2016 1.52 (H) 0.50 - 0.99 mg/dL Final    Comment:      For patients > or = 75 years of age: The upper reference limit for Creatinine is approximately 13% higher for people identified as African-American.      Creatinine, Ser  Date Value Ref Range Status  04/05/2022 5.21 (H) 0.44 - 1.00 mg/dL Final   Creatinine, Urine  Date Value Ref Range Status  10/14/2021 58.95 mg/dL Final    Comment:    Performed at [facility], [place] [address] [place], [place] [address]         Passed - Valid encounter within last 6 months    Recent Outpatient Visits           3 months ago Controlled type 2 diabetes mellitus with chronic kidney disease, without long-term current use of insulin, unspecified CKD stage ([place])   [place], Maxwell, MD   5 months ago Type 2 diabetes mellitus with other neurologic complication, without long-term current use of insulin ([place])    [place], Charlane Ferretti, MD   7 months ago Other insomnia   [place], Charlane Ferretti, MD   10 months ago Type 2 diabetes mellitus with other neurologic complication, without long-term current use of insulin ([place])   [place], Enobong, MD   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

## 2022-04-30 ENCOUNTER — Other Ambulatory Visit (HOSPITAL_COMMUNITY): Payer: Self-pay

## 2022-04-30 ENCOUNTER — Other Ambulatory Visit: Payer: Medicare HMO

## 2022-04-30 DIAGNOSIS — N186 End stage renal disease: Secondary | ICD-10-CM | POA: Diagnosis not present

## 2022-04-30 DIAGNOSIS — N2581 Secondary hyperparathyroidism of renal origin: Secondary | ICD-10-CM | POA: Diagnosis not present

## 2022-04-30 DIAGNOSIS — Z992 Dependence on renal dialysis: Secondary | ICD-10-CM | POA: Diagnosis not present

## 2022-05-02 ENCOUNTER — Ambulatory Visit: Payer: Self-pay | Admitting: *Deleted

## 2022-05-02 DIAGNOSIS — Z992 Dependence on renal dialysis: Secondary | ICD-10-CM | POA: Diagnosis not present

## 2022-05-02 DIAGNOSIS — N2581 Secondary hyperparathyroidism of renal origin: Secondary | ICD-10-CM | POA: Diagnosis not present

## 2022-05-02 DIAGNOSIS — N186 End stage renal disease: Secondary | ICD-10-CM | POA: Diagnosis not present

## 2022-05-02 NOTE — Telephone Encounter (Signed)
  Chief Complaint: patient's daughter on DPR reports fainting during HD treatments Symptoms: fainting during HD treatments for the past 2-3 weeks, bleeding from "fistula site" after HD and required going to ED due to not stopping bleeding. Taking eliquis. Reports blood sugars staying elevated in the 130-180 range throughout day. Reports elevated BP 180's/60-70's. Multiple requests to be addressed.   Frequency: na Pertinent Negatives: Patient denies na  Disposition: [] ED /[] Urgent Care (no appt availability in office) / [] Appointment(In office/virtual)/ []  [place] [facility]/ [] Home Care/ [] Refused Recommended Disposition /[]  Mobile Bus/ [x]  Follow-up with PCP Additional Notes:   Recommended ED if fainting occurs or excessive bleeding from HD site. Daughter requesting appt sooner than Jan 8 with PCP.     Reason for Disposition  Simple fainting is a chronic symptom (has occurred multiple times)    Happened during HD treatments  Answer Assessment - Initial Assessment Questions 1. ONSET: "How long were you unconscious?" (minutes) "When did it happen?"     Fainting occurring during HD. Occurred 2-3 times in past 2-3 weeks  2. CONTENT: "What happened during period of unconsciousness?" (e.g., seizure activity)      Happened during HD per patient daughter  3. MENTAL STATUS: "Alert and oriented now?" (oriented x 3 = name, month, location)      na 4. TRIGGER: "What do you think caused the fainting?" "What were you doing just before you fainted?"  (e.g., exercise, sudden standing up, prolonged standing)     HD  5. RECURRENT SYMPTOM: "Have you ever passed out before?" If Yes, ask: "When was the last time?" and "What happened that time?"      Ye s 6. INJURY: "Did you sustain any injury during the fall?"      na 7. CARDIAC SYMPTOMS: "Have you had any of the following symptoms: chest pain, difficulty breathing, palpitations?"     na 8. NEUROLOGIC SYMPTOMS: "Have you had any of the  following symptoms: headache, numbness, vertigo, weakness?"     na 9. GI SYMPTOMS: "Have you had any of the following symptoms: abdomen pain, vomiting, diarrhea, blood in stools?"     no 10. OTHER SYMPTOMS: "Do you have any other symptoms?"       Bleeding from fistual site after dialysis 11. PREGNANCY: "Is there any chance you are pregnant?" "When was your last menstrual period?"       na  Protocols used: Fainting-A-AH

## 2022-05-05 ENCOUNTER — Other Ambulatory Visit: Payer: Self-pay | Admitting: Family Medicine

## 2022-05-05 ENCOUNTER — Other Ambulatory Visit (HOSPITAL_COMMUNITY): Payer: Self-pay

## 2022-05-05 ENCOUNTER — Encounter (HOSPITAL_COMMUNITY): Payer: Self-pay

## 2022-05-05 DIAGNOSIS — N186 End stage renal disease: Secondary | ICD-10-CM | POA: Diagnosis not present

## 2022-05-05 DIAGNOSIS — E1122 Type 2 diabetes mellitus with diabetic chronic kidney disease: Secondary | ICD-10-CM

## 2022-05-05 DIAGNOSIS — E1149 Type 2 diabetes mellitus with other diabetic neurological complication: Secondary | ICD-10-CM

## 2022-05-05 DIAGNOSIS — N2581 Secondary hyperparathyroidism of renal origin: Secondary | ICD-10-CM | POA: Diagnosis not present

## 2022-05-05 DIAGNOSIS — Z992 Dependence on renal dialysis: Secondary | ICD-10-CM | POA: Diagnosis not present

## 2022-05-06 ENCOUNTER — Other Ambulatory Visit (HOSPITAL_COMMUNITY): Payer: Self-pay

## 2022-05-06 DIAGNOSIS — J810 Acute pulmonary edema: Secondary | ICD-10-CM | POA: Diagnosis not present

## 2022-05-06 DIAGNOSIS — R0602 Shortness of breath: Secondary | ICD-10-CM | POA: Diagnosis not present

## 2022-05-06 DIAGNOSIS — J4521 Mild intermittent asthma with (acute) exacerbation: Secondary | ICD-10-CM | POA: Diagnosis not present

## 2022-05-06 DIAGNOSIS — I480 Paroxysmal atrial fibrillation: Secondary | ICD-10-CM | POA: Diagnosis not present

## 2022-05-06 MED ORDER — ALBUTEROL SULFATE HFA 108 (90 BASE) MCG/ACT IN AERS
1.0000 | INHALATION_SPRAY | Freq: Four times a day (QID) | RESPIRATORY_TRACT | 2 refills | Status: DC | PRN
  Filled 2022-05-06: qty 6.7, 25d supply, fill #0
  Filled 2022-09-01: qty 6.7, 25d supply, fill #1
  Filled 2022-10-27: qty 6.7, 25d supply, fill #2

## 2022-05-06 MED ORDER — ISOSORBIDE MONONITRATE ER 60 MG PO TB24
ORAL_TABLET | Freq: Every day | ORAL | 1 refills | Status: DC
  Filled 2022-05-06: qty 90, 90d supply, fill #0
  Filled 2022-08-03: qty 90, 90d supply, fill #1

## 2022-05-06 MED ORDER — ATORVASTATIN CALCIUM 80 MG PO TABS
80.0000 mg | ORAL_TABLET | Freq: Every day | ORAL | 1 refills | Status: DC
  Filled 2022-05-06: qty 90, 90d supply, fill #0
  Filled 2022-07-28: qty 90, 90d supply, fill #1

## 2022-05-06 NOTE — Telephone Encounter (Signed)
Requested Prescriptions  Pending Prescriptions Disp Refills  . atorvastatin (LIPITOR) 80 MG tablet 90 tablet 1    Sig: Take 1 tablet (80 mg total) by mouth daily.     Cardiovascular:  Antilipid - Statins Failed - 05/05/2022 10:55 AM      Failed - Lipid Panel in normal range within the last 12 months    Cholesterol, Total  Date Value Ref Range Status  06/17/2021 121 100 - 199 mg/dL Final   Cholesterol  Date Value Ref Range Status  09/01/2021 106 0 - 200 mg/dL Final   LDL Chol Calc (NIH)  Date Value Ref Range Status  06/17/2021 61 0 - 99 mg/dL Final   LDL Cholesterol  Date Value Ref Range Status  09/01/2021 48 0 - 99 mg/dL Final    Comment:           Total Cholesterol/HDL:CHD Risk Coronary Heart Disease Risk Table                     Men   Women  1/2 Average Risk   3.4   3.3  Average Risk       5.0   4.4  2 X Average Risk   9.6   7.1  3 X Average Risk  23.4   11.0        Use the calculated Patient Ratio above and the CHD Risk Table to determine the patient's CHD Risk.        ATP III CLASSIFICATION (LDL):  <100     mg/dL   Optimal  100-129  mg/dL   Near or Above                    Optimal  130-159  mg/dL   Borderline  160-189  mg/dL   High  >190     mg/dL   Very High Performed at [facility] [address] [place], [place] [address]    HDL  Date Value Ref Range Status  09/01/2021 46 >40 mg/dL Final  06/17/2021 46 >39 mg/dL Final   Triglycerides  Date Value Ref Range Status  09/01/2021 61 <150 mg/dL Final         Passed - Patient is not pregnant      Passed - Valid encounter within last 12 months    Recent Outpatient Visits          3 months ago Controlled type 2 diabetes mellitus with chronic kidney disease, without long-term current use of insulin, unspecified CKD stage ([place])   [place], [place], MD   5 months ago Type 2 diabetes mellitus with other neurologic complication, without long-term current use of  insulin ([place])   [place], Charlane Ferretti, MD   7 months ago Other insomnia   [place], [place], MD   11 months ago Type 2 diabetes mellitus with other neurologic complication, without long-term current use of insulin ([place])   [place], [place], MD   1 year ago Basilar artery stenosis   [place], [place], MD             . isosorbide mononitrate (IMDUR) 60 MG 24 hr tablet 90 tablet 1    Sig: TAKE 1 TABLET (60 MG TOTAL) BY MOUTH DAILY.     Cardiovascular:  Nitrates Passed - 05/05/2022 10:55 AM  Passed - Last BP in normal range    BP Readings from Last 1 Encounters:  04/05/22 124/60         Passed - Last Heart Rate in normal range    Pulse Readings from Last 1 Encounters:  04/05/22 74         Passed - Valid encounter within last 12 months    Recent Outpatient Visits          3 months ago Controlled type 2 diabetes mellitus with chronic kidney disease, without long-term current use of insulin, unspecified CKD stage ([place])   [place], [place], MD   5 months ago Type 2 diabetes mellitus with other neurologic complication, without long-term current use of insulin ([place])   [place], [place], MD   7 months ago Other insomnia   [place], [place], MD   11 months ago Type 2 diabetes mellitus with other neurologic complication, without long-term current use of insulin ([place])   [place], [place], MD   1 year ago Basilar artery stenosis   [place], Charlane Ferretti, MD             . albuterol (PROAIR HFA) 108 (90 Base) MCG/ACT inhaler 18 g 2    Sig: Inhale 1-2 puffs into the lungs every 6 (six) hours as needed for wheezing or shortness of breath.      Pulmonology:  Beta Agonists 2 Passed - 05/05/2022 10:55 AM      Passed - Last BP in normal range    BP Readings from Last 1 Encounters:  04/05/22 124/60         Passed - Last Heart Rate in normal range    Pulse Readings from Last 1 Encounters:  04/05/22 74         Passed - Valid encounter within last 12 months    Recent Outpatient Visits          3 months ago Controlled type 2 diabetes mellitus with chronic kidney disease, without long-term current use of insulin, unspecified CKD stage ([place])   [place], [place], MD   5 months ago Type 2 diabetes mellitus with other neurologic complication, without long-term current use of insulin ([place])   [place], Charlane Ferretti, MD   7 months ago Other insomnia   [place], [place], MD   11 months ago Type 2 diabetes mellitus with other neurologic complication, without long-term current use of insulin ([place])   [place], [place], MD   1 year ago Basilar artery stenosis   [facility] Charlott Rakes, MD

## 2022-05-07 DIAGNOSIS — Z992 Dependence on renal dialysis: Secondary | ICD-10-CM | POA: Diagnosis not present

## 2022-05-07 DIAGNOSIS — N186 End stage renal disease: Secondary | ICD-10-CM | POA: Diagnosis not present

## 2022-05-07 DIAGNOSIS — N2581 Secondary hyperparathyroidism of renal origin: Secondary | ICD-10-CM | POA: Diagnosis not present

## 2022-05-07 NOTE — Telephone Encounter (Signed)
Spoke to patient daughter. Mother currently is at HD.  Addressed concerns patient is having.  Per daughter, when she goes to HD, they aren't able to dialyze the full amount due to patient becoming lightheaded and feeling.  She states the Nephrologist is at the site and has not changed anything.   Pls advise.   Needs transportation. Appt has to be scheduled at least 3 days out. Given apt for 05/20/2022.

## 2022-05-07 NOTE — Telephone Encounter (Signed)
For hemodialysis patients who have problems with fluctuating blood pressures I would defer to the nephrologist to adjust the blood pressure regimen.  Patients are typically transported to the ED if they have a syncopal episode.  With the multiple concerns including syncope, bleeding that she is having an ED visit will be best.  I am happy to see her for follow-up after that. The blood sugar readings of 130-180 during the day are acceptable for her especially due to her underlying medical conditions.

## 2022-05-08 ENCOUNTER — Other Ambulatory Visit (HOSPITAL_COMMUNITY): Payer: Self-pay

## 2022-05-09 DIAGNOSIS — Z992 Dependence on renal dialysis: Secondary | ICD-10-CM | POA: Diagnosis not present

## 2022-05-09 DIAGNOSIS — N186 End stage renal disease: Secondary | ICD-10-CM | POA: Diagnosis not present

## 2022-05-09 DIAGNOSIS — N2581 Secondary hyperparathyroidism of renal origin: Secondary | ICD-10-CM | POA: Diagnosis not present

## 2022-05-10 DIAGNOSIS — N186 End stage renal disease: Secondary | ICD-10-CM | POA: Diagnosis not present

## 2022-05-10 DIAGNOSIS — I129 Hypertensive chronic kidney disease with stage 1 through stage 4 chronic kidney disease, or unspecified chronic kidney disease: Secondary | ICD-10-CM | POA: Diagnosis not present

## 2022-05-10 DIAGNOSIS — Z992 Dependence on renal dialysis: Secondary | ICD-10-CM | POA: Diagnosis not present

## 2022-05-12 DIAGNOSIS — N2581 Secondary hyperparathyroidism of renal origin: Secondary | ICD-10-CM | POA: Diagnosis not present

## 2022-05-12 DIAGNOSIS — Z992 Dependence on renal dialysis: Secondary | ICD-10-CM | POA: Diagnosis not present

## 2022-05-12 DIAGNOSIS — N186 End stage renal disease: Secondary | ICD-10-CM | POA: Diagnosis not present

## 2022-05-13 ENCOUNTER — Telehealth: Payer: Self-pay | Admitting: Emergency Medicine

## 2022-05-13 NOTE — Telephone Encounter (Signed)
I have not received anything about this. Sending to [place]. Alycia have you seen anything concerning this?

## 2022-05-13 NOTE — Telephone Encounter (Signed)
Copied from [place] [id number]. Topic: General - Other >> May 12, 2022  1:26 PM Leitha Schuller wrote: Reason for CRM: Caller inquiring if rx request fax has been received from pts social worker, Wyatt Portela for [employer] dialysis protein drink  Caller states pts insurance "cap medicaid" will cover cost  Please fax form back to [phone] Attn: Wyatt Portela

## 2022-05-14 DIAGNOSIS — Z992 Dependence on renal dialysis: Secondary | ICD-10-CM | POA: Diagnosis not present

## 2022-05-14 DIAGNOSIS — N186 End stage renal disease: Secondary | ICD-10-CM | POA: Diagnosis not present

## 2022-05-14 DIAGNOSIS — N2581 Secondary hyperparathyroidism of renal origin: Secondary | ICD-10-CM | POA: Diagnosis not present

## 2022-05-15 NOTE — Telephone Encounter (Signed)
Pt is needing script for nepro dialysis protein drink

## 2022-05-16 DIAGNOSIS — N186 End stage renal disease: Secondary | ICD-10-CM | POA: Diagnosis not present

## 2022-05-16 DIAGNOSIS — N2581 Secondary hyperparathyroidism of renal origin: Secondary | ICD-10-CM | POA: Diagnosis not present

## 2022-05-16 DIAGNOSIS — Z992 Dependence on renal dialysis: Secondary | ICD-10-CM | POA: Diagnosis not present

## 2022-05-16 MED ORDER — MISC. DEVICES MISC: 11 refills | Status: AC

## 2022-05-16 NOTE — Addendum Note (Signed)
Addended by: Charlott Rakes on: 05/16/2022 09:38 AM   Modules accepted: Orders

## 2022-05-16 NOTE — Telephone Encounter (Signed)
Done

## 2022-05-19 ENCOUNTER — Other Ambulatory Visit: Payer: Self-pay | Admitting: Family Medicine

## 2022-05-19 ENCOUNTER — Other Ambulatory Visit (HOSPITAL_COMMUNITY): Payer: Self-pay

## 2022-05-19 DIAGNOSIS — N2581 Secondary hyperparathyroidism of renal origin: Secondary | ICD-10-CM | POA: Diagnosis not present

## 2022-05-19 DIAGNOSIS — I651 Occlusion and stenosis of basilar artery: Secondary | ICD-10-CM

## 2022-05-19 DIAGNOSIS — N186 End stage renal disease: Secondary | ICD-10-CM | POA: Diagnosis not present

## 2022-05-19 DIAGNOSIS — Z992 Dependence on renal dialysis: Secondary | ICD-10-CM | POA: Diagnosis not present

## 2022-05-19 NOTE — Telephone Encounter (Signed)
Patient daughter aware of message per Dr. Margarita Rana, nutrition was sent to pharmacy and remind of appt.

## 2022-05-20 ENCOUNTER — Telehealth: Payer: Self-pay | Admitting: Cardiology

## 2022-05-20 ENCOUNTER — Encounter: Payer: Self-pay | Admitting: Family Medicine

## 2022-05-20 ENCOUNTER — Other Ambulatory Visit: Payer: Self-pay

## 2022-05-20 ENCOUNTER — Ambulatory Visit: Payer: Medicare HMO | Attending: Family Medicine | Admitting: Family Medicine

## 2022-05-20 ENCOUNTER — Other Ambulatory Visit (HOSPITAL_COMMUNITY): Payer: Self-pay

## 2022-05-20 VITALS — BP 173/69 | HR 71 | Temp 98.4°F | Ht 62.0 in | Wt 161.6 lb

## 2022-05-20 DIAGNOSIS — I480 Paroxysmal atrial fibrillation: Secondary | ICD-10-CM | POA: Insufficient documentation

## 2022-05-20 DIAGNOSIS — N186 End stage renal disease: Secondary | ICD-10-CM

## 2022-05-20 DIAGNOSIS — I120 Hypertensive chronic kidney disease with stage 5 chronic kidney disease or end stage renal disease: Secondary | ICD-10-CM

## 2022-05-20 DIAGNOSIS — N185 Chronic kidney disease, stage 5: Secondary | ICD-10-CM | POA: Diagnosis not present

## 2022-05-20 DIAGNOSIS — I651 Occlusion and stenosis of basilar artery: Secondary | ICD-10-CM | POA: Diagnosis not present

## 2022-05-20 DIAGNOSIS — E1122 Type 2 diabetes mellitus with diabetic chronic kidney disease: Secondary | ICD-10-CM

## 2022-05-20 DIAGNOSIS — I770 Arteriovenous fistula, acquired: Secondary | ICD-10-CM | POA: Diagnosis not present

## 2022-05-20 HISTORY — DX: Paroxysmal atrial fibrillation: I48.0

## 2022-05-20 LAB — POCT GLYCOSYLATED HEMOGLOBIN (HGB A1C): HbA1c, POC (controlled diabetic range): 6.9 % (ref 0.0–7.0)

## 2022-05-20 LAB — GLUCOSE, POCT (MANUAL RESULT ENTRY): POC Glucose: 154 mg/dl — AB (ref 70–99)

## 2022-05-20 MED ORDER — AMLODIPINE BESYLATE 5 MG PO TABS
5.0000 mg | ORAL_TABLET | Freq: Every day | ORAL | 1 refills | Status: DC
  Filled 2022-05-20 – 2022-05-26 (×2): qty 90, 90d supply, fill #0
  Filled 2022-09-01 – 2022-09-16 (×2): qty 90, 90d supply, fill #1

## 2022-05-20 MED ORDER — CARVEDILOL 12.5 MG PO TABS
12.5000 mg | ORAL_TABLET | Freq: Two times a day (BID) | ORAL | 0 refills | Status: DC
  Filled 2022-05-20: qty 180, 90d supply, fill #0

## 2022-05-20 MED ORDER — CARVEDILOL 12.5 MG PO TABS
12.5000 mg | ORAL_TABLET | Freq: Two times a day (BID) | ORAL | 1 refills | Status: DC
  Filled 2022-05-20: qty 180, 90d supply, fill #0
  Filled 2022-09-01: qty 180, 90d supply, fill #1

## 2022-05-20 MED ORDER — MISC. DEVICES MISC
0 refills | Status: AC
Start: 2022-05-20 — End: ?

## 2022-05-20 MED ORDER — GLIPIZIDE ER 2.5 MG PO TB24
2.5000 mg | ORAL_TABLET | Freq: Every day | ORAL | 3 refills | Status: DC
  Filled 2022-05-20 – 2022-05-31 (×2): qty 30, 30d supply, fill #0
  Filled 2022-07-01: qty 30, 30d supply, fill #1
  Filled 2022-07-31: qty 30, 30d supply, fill #2
  Filled 2022-09-01: qty 30, 30d supply, fill #3

## 2022-05-20 NOTE — Progress Notes (Signed)
Office Visit    Patient Name: Paula [name] Date of Encounter: 05/22/2022  Primary Care Provider:  Charlott Rakes, MD Primary Cardiologist:  None Primary Electrophysiologist: None  Chief Complaint    Paula [name] is a 76 y.o. 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  Past Medical History    Past Medical History:  Diagnosis Date   Allergic rhinitis    Arthritis    Asthma    Brain aneurysm    Chronic kidney disease    Cough    Diabetes mellitus    Diastolic CHF, chronic ([place]) 10/11/2011   GERD (gastroesophageal reflux disease)    History of colon polyps 2012   tubular adenoma    Hyperlipidemia    Hypertension    Neuropathy 10/11/2011   PONV (postoperative nausea and vomiting)    one time after lymph node surgery   Past Surgical History:  Procedure Laterality Date   AV FISTULA PLACEMENT Right 10/15/2021   Procedure: ARTERIOVENOUS (AV) FISTULA  WITH PLACEMENT OF GORE-TEX STRETCH  GRAFT (4-54mmx45cm);  Surgeon: Paula Mould, MD;  [facility];  Service: Vascular;  Laterality: Right;   CATARACT EXTRACTION     right eye   IR 3D INDEPENDENT WKST  12/14/2017   IR ANGIO INTRA EXTRACRAN SEL COM CAROTID INNOMINATE BILAT MOD SED  09/15/2017   IR ANGIO INTRA EXTRACRAN SEL COM CAROTID INNOMINATE UNI L MOD SED  12/14/2017   IR ANGIO VERTEBRAL SEL VERTEBRAL BILAT MOD SED  09/15/2017   IR RADIOLOGIST EVAL & MGMT  09/10/2017   IR RADIOLOGIST EVAL & MGMT  10/19/2017   lymphatic mass surgery     NASAL TURBINATE REDUCTION     RADIOLOGY WITH ANESTHESIA N/A 12/14/2017   Procedure: RADIOLOGY WITH ANESTHESIA EMBOLIZATION;  Surgeon: Paula Bras, MD;  Location: [facility];  Service: Radiology;  Laterality: N/A;   VIDEO BRONCHOSCOPY Bilateral 11/18/2017   Procedure: VIDEO BRONCHOSCOPY  WITHOUT FLUORO;  Surgeon: Paula Gobble, MD;  Location: [facility];  Service: Cardiopulmonary;  Laterality: Bilateral;    Allergies  Allergies  Allergen Reactions   Dust Mite Extract Cough   Peanut-Containing Drug Products Cough   Ace Inhibitors Cough   Lovastatin Other (See Comments)    Generalized body pain    History of Present Illness    Paula [name]  is a 76 year old female with the above mention past medical history who presents today for management of hypertension.  Paula [name] was initially seen by Dr. Angelena [name] in 2013 for complaint of chest pain.  2D echo was completed that showed normal EF at 60-65%, no RWMA, grade 2 DD.  She was seen in subsequent follow-ups and most recently was seen by Dr. Percival [name] after referral from PCP for complaint of chest pain.  During visit patient complained of discomfort under left breast that was sharp.  She was sent for [facility] for further work-up.  Myoview was completed and revealed no evidence of ischemia.  She was admitted to the ED 10/2021 with complaints of shortness of breath, dizziness, and malaise.  She was found to have AKI on stage IV CKD and underwent fistula creation for initiation of HD.   Paula [name] presents today with her daughter for follow-up.  Since last being seen in the office patient reports she has been doing  well from cardiac perspective with no chest pain or shortness of breath.  Her blood pressure today is elevated at 164/70 and recheck was 152/64.  She states that she is just taken her medications prior to today's visits.  She reports that her blood pressures are staying better during her dialysis sessions.  She is planned to be dialyzed tomorrow.  She also endorsed some cramping during her dialysis and I advised her to continue to hydrate especially during sessions to reduce cramping. She is tolerating her current medications without any adverse reactions..  Patient denies chest pain, palpitations,  dyspnea, PND, orthopnea, nausea, vomiting, dizziness, syncope, edema, weight gain, or early satiety.   Home Medications    Current Outpatient Medications  Medication Sig Dispense Refill   acetaminophen (TYLENOL) 500 MG tablet Take 500 mg by mouth every 6 (six) hours as needed for fever, moderate pain or mild pain.     albuterol (PROAIR HFA) 108 (90 Base) MCG/ACT inhaler Inhale 1-2 puffs into the lungs every 6 (six) hours as needed for wheezing or shortness of breath. 6.7 g 2   albuterol (PROVENTIL) (2.5 MG/3ML) 0.083% nebulizer solution Take 3 mLs (1 vial) by nebulization every 6 hours as needed for wheezing or shortness of breath. 90 mL 1   amLODipine (NORVASC) 5 MG tablet Take 1 tablet (5 mg total) by mouth daily. 90 tablet 1   apixaban (ELIQUIS) 5 MG TABS tablet Take 1 tablet by mouth 2 times daily. 60 tablet 3   atorvastatin (LIPITOR) 80 MG tablet Take 1 tablet (80 mg total) by mouth daily. 90 tablet 1   calcium acetate (PHOSLO) 667 MG capsule Take 1 capsule by mouth 3 times daily 270 capsule 6   carvedilol (COREG) 12.5 MG tablet Take 1 tablet (12.5 mg total) by mouth 2 (two) times daily with a meal. (Take once a day in the evening on hemodialysis days) 180 tablet 1   ethyl chloride spray Apply a small amount on skin immediately before cannulation 116 mL PRN   gabapentin (NEURONTIN) 100 MG capsule Take 1 capsule (100 mg total) by mouth 3 (three) times daily. 90 capsule 1   glipiZIDE (GLUCOTROL XL) 2.5 MG 24 hr tablet Take 1 tablet (2.5 mg total) by mouth daily with breakfast. (needs office visit) 30 tablet 3   glucose blood (ACCU-CHEK GUIDE) test strip USE AS INSTRUCTED 2 TIMES DAILY 100 each 2   Homeopathic Products (EARACHE DROPS OT) Place 1 drop into both ears daily.     isosorbide mononitrate (IMDUR) 60 MG 24 hr tablet TAKE 1 TABLET (60 MG TOTAL) BY MOUTH DAILY. 90 tablet 1   lidocaine-prilocaine (EMLA) cream Apply a small amount to dialysis access one hour prior to dialysis. Wrap with  plastic wrap. 30 g PRN   loratadine (CLARITIN) 10 MG tablet Take 1 tablet (10 mg total) by mouth daily. 30 tablet 11   Misc. Devices [employer].  Diagnosis - unstable gait 1 each 0   Misc. Devices MISC Fistula sleeve for R arm.  Diagnoses: End-stage renal disease on hemodialysis 1 each 0   Misc. Devices MISC 1. Incontinence supplies.  2. Briefs.  Diagnosis-urinary incontinence 1 each 11   Misc. Devices MISC Nepro dialysis protein drink.  Diagnosis end-stage renal disease 30 each 11   Misc. Devices MISC Blood pressure monitor.  Diagnosis hypertension 1 each 0   Polyethyl Glycol-Propyl Glycol (SYSTANE OP) Place 1 drop into both eyes daily.     torsemide (DEMADEX) 20 MG tablet Take 2  tablets by mouth 2 times daily 120 tablet 4   Torsemide 40 MG TABS Take 80 mg by mouth 2 (two) times daily. 60 tablet 1   No current facility-administered medications for this visit.     Review of Systems  Please see the history of present illness.    (+) Cramping (+) dizziness  All other systems reviewed and are otherwise negative except as noted above.  Physical Exam    Wt Readings from Last 3 Encounters:  05/22/22 159 lb (72.1 kg)  05/20/22 161 lb 9.6 oz (73.3 kg)  12/23/21 152 lb (68.9 kg)   VS: Vitals:   05/22/22 0911  BP: (!) 164/70  Pulse: 68  SpO2: 98%  ,Body mass index is 29.08 kg/m.  Constitutional:      Appearance: Healthy appearance. Not in distress.  Neck:     Vascular: JVD normal.  Pulmonary:     Effort: Pulmonary effort is normal.     Breath sounds: No wheezing. No rales. Diminished in the bases Cardiovascular:     Normal rate. Regular rhythm. Normal S1. Normal S2.      Murmurs: There is no murmur.  Edema:    Peripheral edema absent.  Abdominal:     Palpations: Abdomen is soft non tender. There is no hepatomegaly.  Skin:    General: Skin is warm and dry.  Neurological:     General: No focal deficit present.     Mental Status: Alert and oriented to person, place and  time.     Cranial Nerves: Cranial nerves are intact.  EKG/LABS/Other Studies Reviewed    ECG personally reviewed by me today -none completed today  Risk Assessment/Calculations:    CHA2DS2-VASc Score = 7   This indicates a 11.2% annual risk of stroke. The patient's score is based upon: CHF History: 1 HTN History: 1 Diabetes History: 1 Stroke History: 0 Vascular Disease History: 1 Age Score: 2 Gender Score: 1           Lab Results  Component Value Date   WBC 7.9 04/05/2022   HGB 11.3 (L) 04/05/2022   HCT 33.0 (L) 04/05/2022   MCV 85.5 04/05/2022   PLT 209 04/05/2022   Lab Results  Component Value Date   CREATININE 5.21 (H) 04/05/2022   BUN 43 (H) 04/05/2022   NA 134 (L) 04/05/2022   K 4.0 04/05/2022   CL 93 (L) 04/05/2022   CO2 25 04/05/2022   Lab Results  Component Value Date   ALT 17 03/25/2022   AST 17 03/25/2022   ALKPHOS 83 03/25/2022   BILITOT 0.5 03/25/2022   Lab Results  Component Value Date   CHOL 106 09/01/2021   HDL 46 09/01/2021   LDLCALC 48 09/01/2021   TRIG 61 09/01/2021   CHOLHDL 2.3 09/01/2021    Lab Results  Component Value Date   HGBA1C 6.9 05/20/2022    Assessment & Plan    1.  Essential hypertension: -Patient's blood pressure today was elevated at 164/70 and was 152/64 on recheck. -Patient was advised to continue medications as prescribed and modify her salt intake -She was provided blood pressure cuff to monitor blood pressures twice daily. -Continue amlodipine, carvedilol, Imdur, and hydralazine  2.  HFpEF: -2D echo completed 02/2021 with LVEF 60 to 47%, grade 1 diastolic dysfunction, severe LVH, left atrial enlargement, no aortic stenosis.  -Patient is volume currently managed by HD and nephrology -Today patient is euvolemic on examination and has no complaints of shortness of breath. -Low  sodium diet, fluid restriction <2L, and daily weights encouraged. Educated to contact our office for weight gain of 2 lbs overnight or 5  lbs in one week.   3.  DM type II: -Currently followed by PCP continue current hypoglycemics  4.  Hyperlipidemia: -Lipids currently checked by PCP -Continue atorvastatin 80 mg daily  5.  ESRD: -Patient is currently receiving dialysis on Monday, Wednesday, Friday schedule -Patient experiencing cramping during dialysis session advised to increase fluid intake.    Disposition: Follow-up with None or APP in 6 months     Medication Adjustments/Labs and Tests Ordered: Current medicines are reviewed at length with the patient today.  Concerns regarding medicines are outlined above.   Signed, Mable Fill, Marissa Nestle, NP 05/22/2022, 10:35 AM [facility]  Note:  This document was prepared using Dragon voice recognition software and may include unintentional dictation errors.

## 2022-05-20 NOTE — Patient Instructions (Signed)
On hemodialysis days, do not take your blood pressure medications in the morning but take them in the evening when you return home.

## 2022-05-20 NOTE — Progress Notes (Signed)
Medication refills Needs referral to podiatry for toe nail clipping.

## 2022-05-20 NOTE — Telephone Encounter (Signed)
Called patient left message on voice mail to call back. 

## 2022-05-20 NOTE — Telephone Encounter (Signed)
Patient has OV scheduled 05/20/22, will refill medication.   Requested Prescriptions  Pending Prescriptions Disp Refills  . carvedilol (COREG) 12.5 MG tablet 180 tablet 0    Sig: Take 1 tablet (12.5 mg total) by mouth 2 (two) times daily with a meal.     Cardiovascular: Beta Blockers 3 Failed - 05/19/2022  9:33 AM      Failed - Cr in normal range and within 360 days    Creat  Date Value Ref Range Status  06/27/2016 1.52 (H) 0.50 - 0.99 mg/dL Final    Comment:      For patients > or = 75 years of age: The upper reference limit for Creatinine is approximately 13% higher for people identified as African-American.      Creatinine, Ser  Date Value Ref Range Status  04/05/2022 5.21 (H) 0.44 - 1.00 mg/dL Final   Creatinine, Urine  Date Value Ref Range Status  10/14/2021 58.95 mg/dL Final    Comment:    Performed at [facility], [place] [address], [place], [place] [address]         Failed - Valid encounter within last 6 months    Recent Outpatient Visits          4 months ago Controlled type 2 diabetes mellitus with chronic kidney disease, without long-term current use of insulin, unspecified CKD stage ([place])   [place], [place], MD   6 months ago Type 2 diabetes mellitus with other neurologic complication, without long-term current use of insulin ([place])   [place], Charlane Ferretti, MD   7 months ago Other insomnia   [place], [place], MD   11 months ago Type 2 diabetes mellitus with other neurologic complication, without long-term current use of insulin ([place])   [facility] Charlott Rakes, MD   1 year ago Basilar artery stenosis   [place], Charlane Ferretti, MD      Future Appointments            Today Charlott Rakes, MD [place] - AST in normal  range and within 360 days    AST  Date Value Ref Range Status  03/25/2022 17 15 - 41 U/L Final         Passed - ALT in normal range and within 360 days    ALT  Date Value Ref Range Status  03/25/2022 17 0 - 44 U/L Final         Passed - Last BP in normal range    BP Readings from Last 1 Encounters:  04/05/22 124/60         Passed - Last Heart Rate in normal range    Pulse Readings from Last 1 Encounters:  04/05/22 74

## 2022-05-20 NOTE — Progress Notes (Signed)
Subjective:  Patient ID: Paula [name], female    DOB: 10-31-1945  Age: 76 y.o. MRN: [id number]  CC: Diabetes   HPI Paula [name] is a 76 y.o. 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  But in the past she did experience some bleeding but on further questioning she had been on Eliquis as well as aspirin. During her hemodialysis sessions her blood pressure drops.  She does have some dizziness as well.  Further questioning reveals she takes her amlodipine at night but has been on carvedilol 12.5 mg twice daily and isosorbide 60 mg daily in addition.  Endorses running out of carvedilol and isosorbide but took amlodipine last night.  BP is elevated, she has no dizziness today.  She would like a prescription for a new blood pressure monitor.  Requests a podiatry referral for clipping of her toenails.  She continues to take glipizide for her diabetes.  With regards to paroxysmal A-fib she is adherent with Eliquis and sees cardiology early next year. Past Medical History:  Diagnosis Date   Allergic rhinitis    Arthritis    Asthma    Brain aneurysm    Chronic kidney disease    Cough    Diabetes mellitus    Diastolic CHF, chronic ([place]) 10/11/2011   GERD (gastroesophageal reflux disease)    History of colon polyps 2012   tubular adenoma    Hyperlipidemia    Hypertension    Neuropathy 10/11/2011   PONV (postoperative nausea and vomiting)    one time after lymph node surgery    Past  Surgical History:  Procedure Laterality Date   AV FISTULA PLACEMENT Right 10/15/2021   Procedure: ARTERIOVENOUS (AV) FISTULA  WITH PLACEMENT OF GORE-TEX STRETCH  GRAFT (4-67mmx45cm);  Surgeon: Angelia Mould, MD;  Location[facility] OR;  Service: Vascular;  Laterality: Right;   CATARACT EXTRACTION     right eye   IR 3D INDEPENDENT WKST  12/14/2017   IR ANGIO INTRA EXTRACRAN SEL COM CAROTID INNOMINATE BILAT MOD SED  09/15/2017   IR ANGIO INTRA EXTRACRAN SEL COM CAROTID INNOMINATE UNI L MOD SED  12/14/2017   IR ANGIO VERTEBRAL SEL VERTEBRAL BILAT MOD SED  09/15/2017   IR RADIOLOGIST EVAL & MGMT  09/10/2017   IR RADIOLOGIST EVAL & MGMT  10/19/2017   lymphatic mass surgery     NASAL TURBINATE REDUCTION     RADIOLOGY WITH ANESTHESIA N/A 12/14/2017   Procedure: RADIOLOGY WITH ANESTHESIA EMBOLIZATION;  Surgeon: Luanne Bras, MD;  Location: [facility];  Service: Radiology;  Laterality: N/A;   VIDEO BRONCHOSCOPY Bilateral 11/18/2017   Procedure: VIDEO BRONCHOSCOPY WITHOUT FLUORO;  Surgeon: Collene Gobble, MD;  Location: [facility];  Service: Cardiopulmonary;  Laterality: Bilateral;    Family History  Problem Relation Age of Onset   Hypertension Mother    Asthma Sister    Colon cancer Neg Hx    Allergic rhinitis Neg Hx    Eczema Neg Hx     Social History   Socioeconomic History   Marital status: Divorced  Spouse name: Not on file   Number of children: 5   Years of education: Not on file   Highest education level: Master's degree (e.g., MA, MS, MEng, MEd, MSW, MBA)  Occupational History   Occupation: Unemployed  Tobacco Use   Smoking status: Never    Passive exposure: Never   Smokeless tobacco: Never  Vaping Use   Vaping Use: Never used  Substance and Sexual Activity   Alcohol use: No   Drug use: No   Sexual activity: Not Currently    Birth control/protection: Post-menopausal, None  Other Topics Concern   Not on file  Social History Narrative   Lives alone.  5 children  with two deceased.     Social Determinants of Health   Financial Resource Strain: Not on file  Food Insecurity: Not on file  Transportation Needs: Not on file  Physical Activity: Not on file  Stress: Not on file  Social Connections: Not on file    Allergies  Allergen Reactions   Dust Mite Extract Cough   Peanut-Containing Drug Products Cough   Ace Inhibitors Cough   Lovastatin Other (See Comments)    Generalized body pain    Outpatient Medications Prior to Visit  Medication Sig Dispense Refill   acetaminophen (TYLENOL) 500 MG tablet Take 500 mg by mouth every 6 (six) hours as needed for fever, moderate pain or mild pain.     albuterol (PROAIR HFA) 108 (90 Base) MCG/ACT inhaler Inhale 1-2 puffs into the lungs every 6 (six) hours as needed for wheezing or shortness of breath. 6.7 g 2   albuterol (PROVENTIL) (2.5 MG/3ML) 0.083% nebulizer solution Take 3 mLs (1 vial) by nebulization every 6 hours as needed for wheezing or shortness of breath. 90 mL 1   apixaban (ELIQUIS) 5 MG TABS tablet Take 1 tablet by mouth 2 times daily. 60 tablet 3   aspirin EC 81 MG tablet Take 81 mg by mouth every evening.     atorvastatin (LIPITOR) 80 MG tablet Take 1 tablet (80 mg total) by mouth daily. 90 tablet 1   calcium acetate (PHOSLO) 667 MG capsule Take 1 capsule by mouth 3 times daily 270 capsule 6   dextromethorphan (DELSYM) 30 MG/5ML liquid Take 30 mg by mouth daily as needed for cough.     ethyl chloride spray Apply a small amount on skin immediately before cannulation 116 mL PRN   fluticasone (FLONASE) 50 MCG/ACT nasal spray Place 2 sprays into both nostrils daily. 16 g 2   gabapentin (NEURONTIN) 100 MG capsule Take 1 capsule (100 mg total) by mouth 3 (three) times daily. 90 capsule 1   glucose blood (ACCU-CHEK GUIDE) test strip USE AS INSTRUCTED 2 TIMES DAILY 100 each 2   guaiFENesin-dextromethorphan (ROBITUSSIN DM) 100-10 MG/5ML syrup Take 10 mLs by mouth every 4 (four) hours as needed for  cough. 118 mL 0   Homeopathic Products (EARACHE DROPS OT) Place 1 drop into both ears daily.     isosorbide mononitrate (IMDUR) 60 MG 24 hr tablet TAKE 1 TABLET (60 MG TOTAL) BY MOUTH DAILY. 90 tablet 1   lidocaine-prilocaine (EMLA) cream Apply a small amount to dialysis access one hour prior to dialysis. Wrap with plastic wrap. 30 g PRN   loratadine (CLARITIN) 10 MG tablet Take 1 tablet (10 mg total) by mouth daily. 30 tablet 11   meclizine (ANTIVERT) 12.5 MG tablet Take 1 tablet (12.5 mg total) by mouth 2 (two) times daily as needed for dizziness. 30 tablet 0  Misc. Devices [place].  Diagnosis - unstable gait 1 each 0   Misc. Devices MISC Fistula sleeve for R arm.  Diagnoses: End-stage renal disease on hemodialysis 1 each 0   Misc. Devices MISC 1. Incontinence supplies.  2. Briefs.  Diagnosis-urinary incontinence 1 each 11   Misc. Devices MISC Nepro dialysis protein drink.  Diagnosis end-stage renal disease 30 each 11   Polyethyl Glycol-Propyl Glycol (SYSTANE OP) Place 1 drop into both eyes daily.     torsemide (DEMADEX) 20 MG tablet Take 2 tablets by mouth 2 times daily 120 tablet 4   Torsemide 40 MG TABS Take 80 mg by mouth 2 (two) times daily. 60 tablet 1   amLODipine (NORVASC) 5 MG tablet Take 1 tablet (5 mg total) by mouth daily. 90 tablet 0   carvedilol (COREG) 12.5 MG tablet Take 1 tablet (12.5 mg total) by mouth 2 (two) times daily with a meal. 180 tablet 0   glipiZIDE (GLUCOTROL XL) 2.5 MG 24 hr tablet Take 1 tablet (2.5 mg total) by mouth daily with breakfast. (needs office visit) 30 tablet 0   No facility-administered medications prior to visit.     ROS Review of Systems  Constitutional:  Negative for activity change and appetite change.  HENT:  Negative for sinus pressure and sore throat.   Respiratory:  Negative for chest tightness, shortness of breath and wheezing.   Cardiovascular:  Negative for chest pain and palpitations.  Gastrointestinal:  Negative for abdominal  distention, abdominal pain and constipation.  Genitourinary: Negative.   Musculoskeletal: Negative.   Psychiatric/Behavioral:  Negative for behavioral problems and dysphoric mood.     Objective:  BP (!) 173/69   Pulse 71   Temp 98.4 F (36.9 C) (Oral)   Ht 5\' 2"  (1.575 m)   Wt 161 lb 9.6 oz (73.3 kg)   LMP  (LMP Unknown)   SpO2 95%   BMI 29.56 kg/m      05/20/2022   10:00 AM 04/05/2022    7:15 PM 04/05/2022    6:30 PM  BP/Weight  Systolic BP 235 573 220  Diastolic BP 69 60 67  Wt. (Lbs) 161.6    BMI 29.56 kg/m2        Physical Exam Constitutional:      Appearance: She is well-developed.  Cardiovascular:     Rate and Rhythm: Normal rate.     Heart sounds: Normal heart sounds. No murmur heard. Pulmonary:     Effort: Pulmonary effort is normal.     Breath sounds: Normal breath sounds. No wheezing or rales.  Chest:     Chest wall: No tenderness.  Abdominal:     General: Bowel sounds are normal. There is no distension.     Palpations: Abdomen is soft. There is no mass.     Tenderness: There is no abdominal tenderness.  Musculoskeletal:        General: Normal range of motion.     Right lower leg: No edema.     Left lower leg: No edema.  Neurological:     Mental Status: She is alert and oriented to person, place, and time.  Psychiatric:        Mood and Affect: Mood normal.        Latest Ref Rng & Units 04/05/2022    2:50 PM 03/25/2022    1:17 PM 12/29/2021    1:52 AM  CMP  Glucose 70 - 99 mg/dL 186  118  292   BUN 8 -  23 mg/dL 43  42  50   Creatinine 0.44 - 1.00 mg/dL 5.21  5.12  5.29   Sodium 135 - 145 mmol/L 134  133  130   Potassium 3.5 - 5.1 mmol/L 4.0  4.2  3.8   Chloride 98 - 111 mmol/L 93  92  94   CO2 22 - 32 mmol/L 25  31  26    Calcium 8.9 - 10.3 mg/dL 9.5  9.2  8.4   Total Protein 6.5 - 8.1 g/dL  6.8    Total Bilirubin 0.3 - 1.2 mg/dL  0.5    Alkaline Phos 38 - 126 U/L  83    AST 15 - 41 U/L  17    ALT 0 - 44 U/L  17      Lipid Panel      Component Value Date/Time   CHOL 106 09/01/2021 1041   CHOL 121 06/17/2021 0904   TRIG 61 09/01/2021 1041   HDL 46 09/01/2021 1041   HDL 46 06/17/2021 0904   CHOLHDL 2.3 09/01/2021 1041   VLDL 12 09/01/2021 1041   LDLCALC 48 09/01/2021 1041   LDLCALC 61 06/17/2021 0904    CBC    Component Value Date/Time   WBC 7.9 04/05/2022 1450   RBC 3.86 (L) 04/05/2022 1450   HGB 11.3 (L) 04/05/2022 1450   HGB 12.4 09/02/2018 1211   HCT 33.0 (L) 04/05/2022 1450   HCT 37.3 09/02/2018 1211   PLT 209 04/05/2022 1450   PLT 203 09/02/2018 1211   MCV 85.5 04/05/2022 1450   MCV 87 09/02/2018 1211   MCH 29.3 04/05/2022 1450   MCHC 34.2 04/05/2022 1450   RDW 14.8 04/05/2022 1450   RDW 13.1 09/02/2018 1211   LYMPHSABS 1.2 03/25/2022 1317   LYMPHSABS 1.2 09/02/2018 1211   MONOABS 0.7 03/25/2022 1317   EOSABS 0.2 03/25/2022 1317   EOSABS 0.1 09/02/2018 1211   BASOSABS 0.0 03/25/2022 1317   BASOSABS 0.0 09/02/2018 1211    Lab Results  Component Value Date   HGBA1C 6.9 05/20/2022    Assessment & Plan:  1. Controlled type 2 diabetes mellitus with chronic kidney disease, without long-term current use of insulin, unspecified CKD stage ([place]) Controlled with A1c of 6.9 Continue current regimen; if hypoglycemia occurs discontinue glipizide. Counseled on Diabetic diet, my plate method, 025 minutes of moderate intensity exercise/week Blood sugar logs with fasting goals of 80-120 mg/dl, random of less than 180 and in the event of sugars less than 60 mg/dl or greater than 400 mg/dl encouraged to notify the clinic. Advised on the need for annual eye exams, annual foot exams, Pneumonia vaccine.  - POCT glucose (manual entry) - POCT glycosylated hemoglobin (Hb A1C) - Ambulatory referral to Podiatry  2. Paroxysmal A-fib (HCC) Currently on anticoagulation with Eliquis, rate control with carvedilol Advised to discontinue aspirin due to increased risk of bleeding  3. Basilar artery stenosis Managed  by Dr. Estanislado Pandy - carvedilol (COREG) 12.5 MG tablet; Take 1 tablet (12.5 mg total) by mouth 2 (two) times daily with a meal. (Take once a day in the evening on hemodialysis days)  Dispense: 180 tablet; Refill: 1  4. Hypertension in stage 5 chronic kidney disease due to type 2 diabetes mellitus ([place]) Uncontrolled but she does tend to be hypotensive during hemodialysis Advised that on mornings of dialysis she needs to skip her antihypertensive and take the dose when she gets back home.  For carvedilol she will skip morning dose altogether and just take  evening dose Advised her I would prefer her nephrologist manage her hypotension especially in the light of hypotension which occurs during hemodialysis I have written prescription for a blood pressure monitor for her.   5. End stage chronic kidney disease ([place]) Continue hemodialysis per schedule   Meds ordered this encounter  Medications   carvedilol (COREG) 12.5 MG tablet    Sig: Take 1 tablet (12.5 mg total) by mouth 2 (two) times daily with a meal. (Take once a day in the evening on hemodialysis days)    Dispense:  180 tablet    Refill:  1   amLODipine (NORVASC) 5 MG tablet    Sig: Take 1 tablet (5 mg total) by mouth daily.    Dispense:  90 tablet    Refill:  1   glipiZIDE (GLUCOTROL XL) 2.5 MG 24 hr tablet    Sig: Take 1 tablet (2.5 mg total) by mouth daily with breakfast. (needs office visit)    Dispense:  30 tablet    Refill:  3   Misc. Devices MISC    Sig: Blood pressure monitor.  Diagnosis hypertension    Dispense:  1 each    Refill:  0    Follow-up: Return in about 3 months (around 08/20/2022).       Charlott Rakes, MD, FAAFP. [facility] [place], [place]   05/20/2022, 12:36 PM

## 2022-05-20 NOTE — Telephone Encounter (Signed)
Pt c/o BP issue: STAT if pt c/o blurred vision, one-sided weakness or slurred speech  1. What are your last 5 BP readings? 195/80 173/80 ,drops low while doing Dialysis- she says that sometimes they have to revive her  2. Are you having any other symptoms (ex. Dizziness, headache, blurred vision, passed out)?  Some dizziness, off balance and headache  3. What is your BP issue? Up high and low while having Dialysis- please evaluate- made her an appointment for 05-22-22 with [facility]

## 2022-05-21 ENCOUNTER — Other Ambulatory Visit (HOSPITAL_COMMUNITY): Payer: Self-pay

## 2022-05-21 DIAGNOSIS — N2581 Secondary hyperparathyroidism of renal origin: Secondary | ICD-10-CM | POA: Diagnosis not present

## 2022-05-21 DIAGNOSIS — N186 End stage renal disease: Secondary | ICD-10-CM | POA: Diagnosis not present

## 2022-05-21 DIAGNOSIS — Z992 Dependence on renal dialysis: Secondary | ICD-10-CM | POA: Diagnosis not present

## 2022-05-21 NOTE — Telephone Encounter (Signed)
Spoke with pt daughter, they have stopped her morning carvedilol and are having her take it in the evening. She has a follow up appointment tomorrow and will call prior to that appointment

## 2022-05-22 ENCOUNTER — Encounter: Payer: Self-pay | Admitting: Nurse Practitioner

## 2022-05-22 ENCOUNTER — Encounter (HOSPITAL_COMMUNITY): Payer: Self-pay

## 2022-05-22 ENCOUNTER — Ambulatory Visit: Payer: Medicare HMO | Attending: Cardiology | Admitting: Nurse Practitioner

## 2022-05-22 VITALS — BP 164/70 | HR 68 | Ht 62.0 in | Wt 159.0 lb

## 2022-05-22 DIAGNOSIS — E785 Hyperlipidemia, unspecified: Secondary | ICD-10-CM

## 2022-05-22 DIAGNOSIS — I10 Essential (primary) hypertension: Secondary | ICD-10-CM

## 2022-05-22 DIAGNOSIS — R6889 Other general symptoms and signs: Secondary | ICD-10-CM | POA: Diagnosis not present

## 2022-05-22 DIAGNOSIS — N186 End stage renal disease: Secondary | ICD-10-CM

## 2022-05-22 DIAGNOSIS — E118 Type 2 diabetes mellitus with unspecified complications: Secondary | ICD-10-CM

## 2022-05-22 DIAGNOSIS — I5032 Chronic diastolic (congestive) heart failure: Secondary | ICD-10-CM | POA: Diagnosis not present

## 2022-05-22 NOTE — Patient Instructions (Signed)
Medication Instructions:  Your physician recommends that you continue on your current medications as directed. Please refer to the Current Medication list given to you today.   *If you need a refill on your cardiac medications before your next appointment, please call your pharmacy*   Lab Work: None ordered   If you have labs (blood work) drawn today and your tests are completely normal, you will receive your results only by: [place] (if you have MyChart) OR A paper copy in the mail If you have any lab test that is abnormal or we need to change your treatment, we will call you to review the results.   Testing/Procedures: None ordered    Follow-Up: At [facility], you and your health needs are our priority.  As part of our continuing mission to provide you with exceptional heart care, we have created designated Provider Care Teams.  These Care Teams include your primary Cardiologist (physician) and Advanced Practice Providers (APPs -  Physician Assistants and Nurse Practitioners) who all work together to provide you with the care you need, when you need it.  We recommend signing up for the patient portal called "MyChart".  Sign up information is provided on this After Visit Summary.  MyChart is used to connect with patients for Virtual Visits (Telemedicine).  Patients are able to view lab/test results, encounter notes, upcoming appointments, etc.  Non-urgent messages can be sent to your provider as well.   To learn more about what you can do with MyChart, go to [email or web address].    Your next appointment:   6 month(s)  The format for your next appointment:   In Person  Provider:   Dr. Minus Breeding    Other Instructions Blood Pressure Record Sheet To take your blood pressure, you will need a blood pressure machine. You may be prescribed one, or you can buy a blood pressure machine (blood pressure monitor) at your clinic, drug store, or online. When choosing  one, look for these features: An automatic monitor that has an arm cuff. A cuff that wraps snugly, but not too tightly, around your upper arm. You should be able to fit only one finger between your arm and the cuff. A device that stores blood pressure reading results. Do not choose a monitor that measures your blood pressure from your wrist or finger. Follow your health care provider's instructions for how to take your blood pressure. To use this form: Get one reading in the morning (a.m.) before you take any medicines. Get one reading in the evening (p.m.) before supper. Take at least two readings with each blood pressure check. This makes sure the results are correct. Wait 1-2 minutes between measurements. Write down the results in the spaces on this form. Repeat this once a week, or as told by your health care provider. Make a follow-up appointment with your health care provider to discuss the results. Blood pressure log Date: _______________________ a.m. _____________________(1st reading) _____________________(2nd reading) p.m. _____________________(1st reading) _____________________(2nd reading) Date: _______________________ a.m. _____________________(1st reading) _____________________(2nd reading) p.m. _____________________(1st reading) _____________________(2nd reading) Date: _______________________ a.m. _____________________(1st reading) _____________________(2nd reading) p.m. _____________________(1st reading) _____________________(2nd reading) Date: _______________________ a.m. _____________________(1st reading) _____________________(2nd reading) p.m. _____________________(1st reading) _____________________(2nd reading) Date: _______________________ a.m. _____________________(1st reading) _____________________(2nd reading) p.m. _____________________(1st reading) _____________________(2nd reading) This information is not intended to replace advice given to you by your health care  provider. Make sure you discuss any questions you have with your health care provider. Document Revised: 04/11/2021 Document Reviewed: 04/11/2021 Elsevier Patient Education  [place].

## 2022-05-23 DIAGNOSIS — N2581 Secondary hyperparathyroidism of renal origin: Secondary | ICD-10-CM | POA: Diagnosis not present

## 2022-05-23 DIAGNOSIS — N186 End stage renal disease: Secondary | ICD-10-CM | POA: Diagnosis not present

## 2022-05-23 DIAGNOSIS — Z992 Dependence on renal dialysis: Secondary | ICD-10-CM | POA: Diagnosis not present

## 2022-05-26 ENCOUNTER — Other Ambulatory Visit (HOSPITAL_COMMUNITY): Payer: Self-pay

## 2022-05-26 DIAGNOSIS — Z992 Dependence on renal dialysis: Secondary | ICD-10-CM | POA: Diagnosis not present

## 2022-05-26 DIAGNOSIS — N186 End stage renal disease: Secondary | ICD-10-CM | POA: Diagnosis not present

## 2022-05-26 DIAGNOSIS — N2581 Secondary hyperparathyroidism of renal origin: Secondary | ICD-10-CM | POA: Diagnosis not present

## 2022-05-28 DIAGNOSIS — N186 End stage renal disease: Secondary | ICD-10-CM | POA: Diagnosis not present

## 2022-05-28 DIAGNOSIS — N2581 Secondary hyperparathyroidism of renal origin: Secondary | ICD-10-CM | POA: Diagnosis not present

## 2022-05-28 DIAGNOSIS — Z992 Dependence on renal dialysis: Secondary | ICD-10-CM | POA: Diagnosis not present

## 2022-05-30 DIAGNOSIS — Z992 Dependence on renal dialysis: Secondary | ICD-10-CM | POA: Diagnosis not present

## 2022-05-30 DIAGNOSIS — N186 End stage renal disease: Secondary | ICD-10-CM | POA: Diagnosis not present

## 2022-05-30 DIAGNOSIS — N2581 Secondary hyperparathyroidism of renal origin: Secondary | ICD-10-CM | POA: Diagnosis not present

## 2022-06-02 ENCOUNTER — Other Ambulatory Visit (HOSPITAL_COMMUNITY): Payer: Self-pay

## 2022-06-02 DIAGNOSIS — Z992 Dependence on renal dialysis: Secondary | ICD-10-CM | POA: Diagnosis not present

## 2022-06-02 DIAGNOSIS — N2581 Secondary hyperparathyroidism of renal origin: Secondary | ICD-10-CM | POA: Diagnosis not present

## 2022-06-02 DIAGNOSIS — N186 End stage renal disease: Secondary | ICD-10-CM | POA: Diagnosis not present

## 2022-06-03 ENCOUNTER — Encounter: Payer: Self-pay | Admitting: Podiatry

## 2022-06-03 ENCOUNTER — Ambulatory Visit (INDEPENDENT_AMBULATORY_CARE_PROVIDER_SITE_OTHER): Payer: Medicare HMO | Admitting: Podiatry

## 2022-06-03 DIAGNOSIS — M2141 Flat foot [pes planus] (acquired), right foot: Secondary | ICD-10-CM | POA: Diagnosis not present

## 2022-06-03 DIAGNOSIS — M2142 Flat foot [pes planus] (acquired), left foot: Secondary | ICD-10-CM

## 2022-06-03 DIAGNOSIS — M79675 Pain in left toe(s): Secondary | ICD-10-CM | POA: Diagnosis not present

## 2022-06-03 DIAGNOSIS — B351 Tinea unguium: Secondary | ICD-10-CM | POA: Diagnosis not present

## 2022-06-03 DIAGNOSIS — E119 Type 2 diabetes mellitus without complications: Secondary | ICD-10-CM | POA: Diagnosis not present

## 2022-06-03 DIAGNOSIS — M79674 Pain in right toe(s): Secondary | ICD-10-CM

## 2022-06-03 NOTE — Progress Notes (Signed)
  Subjective:  Patient ID: Paula [name], female    DOB: 01-07-1946,  MRN: [id number]  Chief Complaint  Patient presents with   Toe Pain    Hallux bilateral - tried to clip toenails herself and now the toes are sore, wanted them checked   Diabetes    Last a1c was 6.9, on dialysis as well   New Patient (Initial Visit)    Est pt 2021    76 y.o. female presents with the above complaint. History confirmed with patient.  She is interested in diabetic shoes.  The nails are causing pain.  Objective:  Physical Exam: warm, good capillary refill, no trophic changes or ulcerative lesions, normal DP and PT pulses, normal monofilament exam, normal sensory exam, and thickened dystrophic bilateral hallux nail with longitudinal melanonychia with no evidence of spread or Hutchinson sign on left hallux.  Pes planus bilateral  Assessment:   1. Nail dystrophy   2. Encounter for diabetic foot exam ([place])   3. Pes planus of both feet   4. Type 2 diabetes mellitus without complication, without long-term current use of insulin ([place])      Plan:  Patient was evaluated and treated and all questions answered.  Patient educated on diabetes. Discussed proper diabetic foot care and discussed risks and complications of disease. Educated patient in depth on reasons to return to the office immediately should he/she discover anything concerning or new on the feet. All questions answered. Discussed proper shoes as well.   She is interested in diabetic shoes.  I discussed with her we are not able to fit these currently and dispensed by the end of year.  We will be casting for these again in January which I advised and she will be scheduled for this.  Bilateral hallux nails were debrided in length and thickness using a sharp nail nipper and rotary bur to a comfortable level.  She will return as needed for this.  Return if symptoms worsen or fail to improve.

## 2022-06-04 DIAGNOSIS — N186 End stage renal disease: Secondary | ICD-10-CM | POA: Diagnosis not present

## 2022-06-04 DIAGNOSIS — N2581 Secondary hyperparathyroidism of renal origin: Secondary | ICD-10-CM | POA: Diagnosis not present

## 2022-06-04 DIAGNOSIS — Z992 Dependence on renal dialysis: Secondary | ICD-10-CM | POA: Diagnosis not present

## 2022-06-06 DIAGNOSIS — N186 End stage renal disease: Secondary | ICD-10-CM | POA: Diagnosis not present

## 2022-06-06 DIAGNOSIS — Z992 Dependence on renal dialysis: Secondary | ICD-10-CM | POA: Diagnosis not present

## 2022-06-06 DIAGNOSIS — N2581 Secondary hyperparathyroidism of renal origin: Secondary | ICD-10-CM | POA: Diagnosis not present

## 2022-06-09 DIAGNOSIS — N186 End stage renal disease: Secondary | ICD-10-CM | POA: Diagnosis not present

## 2022-06-09 DIAGNOSIS — Z992 Dependence on renal dialysis: Secondary | ICD-10-CM | POA: Diagnosis not present

## 2022-06-09 DIAGNOSIS — N2581 Secondary hyperparathyroidism of renal origin: Secondary | ICD-10-CM | POA: Diagnosis not present

## 2022-06-10 ENCOUNTER — Ambulatory Visit: Payer: Medicare HMO | Admitting: Cardiology

## 2022-06-10 DIAGNOSIS — N186 End stage renal disease: Secondary | ICD-10-CM | POA: Diagnosis not present

## 2022-06-10 DIAGNOSIS — I129 Hypertensive chronic kidney disease with stage 1 through stage 4 chronic kidney disease, or unspecified chronic kidney disease: Secondary | ICD-10-CM | POA: Diagnosis not present

## 2022-06-10 DIAGNOSIS — Z992 Dependence on renal dialysis: Secondary | ICD-10-CM | POA: Diagnosis not present

## 2022-06-11 DIAGNOSIS — N2581 Secondary hyperparathyroidism of renal origin: Secondary | ICD-10-CM | POA: Diagnosis not present

## 2022-06-11 DIAGNOSIS — Z992 Dependence on renal dialysis: Secondary | ICD-10-CM | POA: Diagnosis not present

## 2022-06-11 DIAGNOSIS — N186 End stage renal disease: Secondary | ICD-10-CM | POA: Diagnosis not present

## 2022-06-13 DIAGNOSIS — Z992 Dependence on renal dialysis: Secondary | ICD-10-CM | POA: Diagnosis not present

## 2022-06-13 DIAGNOSIS — N2581 Secondary hyperparathyroidism of renal origin: Secondary | ICD-10-CM | POA: Diagnosis not present

## 2022-06-13 DIAGNOSIS — N186 End stage renal disease: Secondary | ICD-10-CM | POA: Diagnosis not present

## 2022-06-16 ENCOUNTER — Encounter: Payer: Medicare HMO | Admitting: Family Medicine

## 2022-06-16 DIAGNOSIS — Z992 Dependence on renal dialysis: Secondary | ICD-10-CM | POA: Diagnosis not present

## 2022-06-16 DIAGNOSIS — N2581 Secondary hyperparathyroidism of renal origin: Secondary | ICD-10-CM | POA: Diagnosis not present

## 2022-06-16 DIAGNOSIS — N186 End stage renal disease: Secondary | ICD-10-CM | POA: Diagnosis not present

## 2022-06-18 DIAGNOSIS — Z992 Dependence on renal dialysis: Secondary | ICD-10-CM | POA: Diagnosis not present

## 2022-06-18 DIAGNOSIS — N2581 Secondary hyperparathyroidism of renal origin: Secondary | ICD-10-CM | POA: Diagnosis not present

## 2022-06-18 DIAGNOSIS — N186 End stage renal disease: Secondary | ICD-10-CM | POA: Diagnosis not present

## 2022-06-20 DIAGNOSIS — N186 End stage renal disease: Secondary | ICD-10-CM | POA: Diagnosis not present

## 2022-06-20 DIAGNOSIS — N2581 Secondary hyperparathyroidism of renal origin: Secondary | ICD-10-CM | POA: Diagnosis not present

## 2022-06-20 DIAGNOSIS — Z992 Dependence on renal dialysis: Secondary | ICD-10-CM | POA: Diagnosis not present

## 2022-06-23 DIAGNOSIS — Z992 Dependence on renal dialysis: Secondary | ICD-10-CM | POA: Diagnosis not present

## 2022-06-23 DIAGNOSIS — N186 End stage renal disease: Secondary | ICD-10-CM | POA: Diagnosis not present

## 2022-06-23 DIAGNOSIS — N2581 Secondary hyperparathyroidism of renal origin: Secondary | ICD-10-CM | POA: Diagnosis not present

## 2022-06-25 DIAGNOSIS — N186 End stage renal disease: Secondary | ICD-10-CM | POA: Diagnosis not present

## 2022-06-25 DIAGNOSIS — Z992 Dependence on renal dialysis: Secondary | ICD-10-CM | POA: Diagnosis not present

## 2022-06-25 DIAGNOSIS — N2581 Secondary hyperparathyroidism of renal origin: Secondary | ICD-10-CM | POA: Diagnosis not present

## 2022-06-26 DIAGNOSIS — R6889 Other general symptoms and signs: Secondary | ICD-10-CM | POA: Diagnosis not present

## 2022-06-27 DIAGNOSIS — N2581 Secondary hyperparathyroidism of renal origin: Secondary | ICD-10-CM | POA: Diagnosis not present

## 2022-06-27 DIAGNOSIS — N186 End stage renal disease: Secondary | ICD-10-CM | POA: Diagnosis not present

## 2022-06-27 DIAGNOSIS — Z992 Dependence on renal dialysis: Secondary | ICD-10-CM | POA: Diagnosis not present

## 2022-06-29 DIAGNOSIS — Z992 Dependence on renal dialysis: Secondary | ICD-10-CM | POA: Diagnosis not present

## 2022-06-29 DIAGNOSIS — N2581 Secondary hyperparathyroidism of renal origin: Secondary | ICD-10-CM | POA: Diagnosis not present

## 2022-06-29 DIAGNOSIS — N186 End stage renal disease: Secondary | ICD-10-CM | POA: Diagnosis not present

## 2022-07-01 ENCOUNTER — Other Ambulatory Visit (HOSPITAL_COMMUNITY): Payer: Self-pay

## 2022-07-01 ENCOUNTER — Encounter (HOSPITAL_COMMUNITY): Payer: Self-pay

## 2022-07-01 ENCOUNTER — Other Ambulatory Visit: Payer: Self-pay | Admitting: Family Medicine

## 2022-07-01 DIAGNOSIS — N2581 Secondary hyperparathyroidism of renal origin: Secondary | ICD-10-CM | POA: Diagnosis not present

## 2022-07-01 DIAGNOSIS — N186 End stage renal disease: Secondary | ICD-10-CM | POA: Diagnosis not present

## 2022-07-01 DIAGNOSIS — Z992 Dependence on renal dialysis: Secondary | ICD-10-CM | POA: Diagnosis not present

## 2022-07-01 MED ORDER — APIXABAN 5 MG PO TABS
5.0000 mg | ORAL_TABLET | Freq: Two times a day (BID) | ORAL | 3 refills | Status: DC
  Filled 2022-07-01: qty 60, 30d supply, fill #0
  Filled 2022-07-28: qty 60, 30d supply, fill #1
  Filled 2022-09-01: qty 60, 30d supply, fill #2
  Filled 2022-09-30: qty 60, 30d supply, fill #3

## 2022-07-02 ENCOUNTER — Other Ambulatory Visit (HOSPITAL_COMMUNITY): Payer: Self-pay

## 2022-07-04 ENCOUNTER — Other Ambulatory Visit (HOSPITAL_COMMUNITY): Payer: Self-pay

## 2022-07-04 DIAGNOSIS — N2581 Secondary hyperparathyroidism of renal origin: Secondary | ICD-10-CM | POA: Diagnosis not present

## 2022-07-04 DIAGNOSIS — N186 End stage renal disease: Secondary | ICD-10-CM | POA: Diagnosis not present

## 2022-07-04 DIAGNOSIS — Z992 Dependence on renal dialysis: Secondary | ICD-10-CM | POA: Diagnosis not present

## 2022-07-07 DIAGNOSIS — N186 End stage renal disease: Secondary | ICD-10-CM | POA: Diagnosis not present

## 2022-07-07 DIAGNOSIS — N2581 Secondary hyperparathyroidism of renal origin: Secondary | ICD-10-CM | POA: Diagnosis not present

## 2022-07-07 DIAGNOSIS — Z992 Dependence on renal dialysis: Secondary | ICD-10-CM | POA: Diagnosis not present

## 2022-07-09 DIAGNOSIS — N186 End stage renal disease: Secondary | ICD-10-CM | POA: Diagnosis not present

## 2022-07-09 DIAGNOSIS — N2581 Secondary hyperparathyroidism of renal origin: Secondary | ICD-10-CM | POA: Diagnosis not present

## 2022-07-09 DIAGNOSIS — Z992 Dependence on renal dialysis: Secondary | ICD-10-CM | POA: Diagnosis not present

## 2022-07-10 ENCOUNTER — Ambulatory Visit: Payer: Medicare HMO | Admitting: Emergency Medicine

## 2022-07-10 DIAGNOSIS — Z992 Dependence on renal dialysis: Secondary | ICD-10-CM | POA: Diagnosis not present

## 2022-07-10 DIAGNOSIS — I129 Hypertensive chronic kidney disease with stage 1 through stage 4 chronic kidney disease, or unspecified chronic kidney disease: Secondary | ICD-10-CM | POA: Diagnosis not present

## 2022-07-10 DIAGNOSIS — N186 End stage renal disease: Secondary | ICD-10-CM | POA: Diagnosis not present

## 2022-07-11 DIAGNOSIS — N2581 Secondary hyperparathyroidism of renal origin: Secondary | ICD-10-CM | POA: Diagnosis not present

## 2022-07-11 DIAGNOSIS — Z992 Dependence on renal dialysis: Secondary | ICD-10-CM | POA: Diagnosis not present

## 2022-07-11 DIAGNOSIS — N186 End stage renal disease: Secondary | ICD-10-CM | POA: Diagnosis not present

## 2022-07-14 ENCOUNTER — Encounter: Payer: Medicare HMO | Admitting: Family Medicine

## 2022-07-14 DIAGNOSIS — N186 End stage renal disease: Secondary | ICD-10-CM | POA: Diagnosis not present

## 2022-07-14 DIAGNOSIS — N2581 Secondary hyperparathyroidism of renal origin: Secondary | ICD-10-CM | POA: Diagnosis not present

## 2022-07-14 DIAGNOSIS — Z992 Dependence on renal dialysis: Secondary | ICD-10-CM | POA: Diagnosis not present

## 2022-07-16 DIAGNOSIS — N186 End stage renal disease: Secondary | ICD-10-CM | POA: Diagnosis not present

## 2022-07-16 DIAGNOSIS — Z992 Dependence on renal dialysis: Secondary | ICD-10-CM | POA: Diagnosis not present

## 2022-07-16 DIAGNOSIS — N2581 Secondary hyperparathyroidism of renal origin: Secondary | ICD-10-CM | POA: Diagnosis not present

## 2022-07-18 DIAGNOSIS — Z992 Dependence on renal dialysis: Secondary | ICD-10-CM | POA: Diagnosis not present

## 2022-07-18 DIAGNOSIS — N186 End stage renal disease: Secondary | ICD-10-CM | POA: Diagnosis not present

## 2022-07-18 DIAGNOSIS — N2581 Secondary hyperparathyroidism of renal origin: Secondary | ICD-10-CM | POA: Diagnosis not present

## 2022-07-20 ENCOUNTER — Other Ambulatory Visit (HOSPITAL_COMMUNITY): Payer: Self-pay

## 2022-07-21 ENCOUNTER — Other Ambulatory Visit (HOSPITAL_COMMUNITY): Payer: Self-pay

## 2022-07-21 DIAGNOSIS — N2581 Secondary hyperparathyroidism of renal origin: Secondary | ICD-10-CM | POA: Diagnosis not present

## 2022-07-21 DIAGNOSIS — Z992 Dependence on renal dialysis: Secondary | ICD-10-CM | POA: Diagnosis not present

## 2022-07-21 DIAGNOSIS — N186 End stage renal disease: Secondary | ICD-10-CM | POA: Diagnosis not present

## 2022-07-22 ENCOUNTER — Telehealth: Payer: Self-pay

## 2022-07-22 NOTE — Telephone Encounter (Signed)
Copied from [place] [id number]. Topic: Appointment Scheduling - Scheduling Inquiry for Clinic >> Jul 21, 2022  9:18 AM Janett Billow I wrote: Reason for CRM: Patient called and stated that she would like to reschedule AWV. Please advise      I called patient and left voicemail trying to get her rescheduled for a welcome to medicare visit

## 2022-07-23 DIAGNOSIS — Z992 Dependence on renal dialysis: Secondary | ICD-10-CM | POA: Diagnosis not present

## 2022-07-23 DIAGNOSIS — N2581 Secondary hyperparathyroidism of renal origin: Secondary | ICD-10-CM | POA: Diagnosis not present

## 2022-07-23 DIAGNOSIS — N186 End stage renal disease: Secondary | ICD-10-CM | POA: Diagnosis not present

## 2022-07-23 NOTE — Telephone Encounter (Signed)
Daughter Kevan Ny calling back to reschedule pt's appt. Please call [phone].(The other phone is out of commission at this time)

## 2022-07-25 DIAGNOSIS — N2581 Secondary hyperparathyroidism of renal origin: Secondary | ICD-10-CM | POA: Diagnosis not present

## 2022-07-25 DIAGNOSIS — N186 End stage renal disease: Secondary | ICD-10-CM | POA: Diagnosis not present

## 2022-07-25 DIAGNOSIS — Z992 Dependence on renal dialysis: Secondary | ICD-10-CM | POA: Diagnosis not present

## 2022-07-28 DIAGNOSIS — N2581 Secondary hyperparathyroidism of renal origin: Secondary | ICD-10-CM | POA: Diagnosis not present

## 2022-07-28 DIAGNOSIS — N186 End stage renal disease: Secondary | ICD-10-CM | POA: Diagnosis not present

## 2022-07-28 DIAGNOSIS — Z992 Dependence on renal dialysis: Secondary | ICD-10-CM | POA: Diagnosis not present

## 2022-07-29 ENCOUNTER — Other Ambulatory Visit: Payer: Self-pay

## 2022-07-29 NOTE — Telephone Encounter (Signed)
Called patients daughter  and she informed me that the patients insurance sent a letter saying they need awv done before the end of the year I did let her know that all other providers including [facility] are booked up and she would have to wait till the the January 10th to do the awv

## 2022-07-30 DIAGNOSIS — N2581 Secondary hyperparathyroidism of renal origin: Secondary | ICD-10-CM | POA: Diagnosis not present

## 2022-07-30 DIAGNOSIS — N186 End stage renal disease: Secondary | ICD-10-CM | POA: Diagnosis not present

## 2022-07-30 DIAGNOSIS — Z992 Dependence on renal dialysis: Secondary | ICD-10-CM | POA: Diagnosis not present

## 2022-07-31 ENCOUNTER — Other Ambulatory Visit (HOSPITAL_COMMUNITY): Payer: Self-pay

## 2022-08-01 DIAGNOSIS — N186 End stage renal disease: Secondary | ICD-10-CM | POA: Diagnosis not present

## 2022-08-01 DIAGNOSIS — Z992 Dependence on renal dialysis: Secondary | ICD-10-CM | POA: Diagnosis not present

## 2022-08-01 DIAGNOSIS — N2581 Secondary hyperparathyroidism of renal origin: Secondary | ICD-10-CM | POA: Diagnosis not present

## 2022-08-03 DIAGNOSIS — N2581 Secondary hyperparathyroidism of renal origin: Secondary | ICD-10-CM | POA: Diagnosis not present

## 2022-08-03 DIAGNOSIS — Z992 Dependence on renal dialysis: Secondary | ICD-10-CM | POA: Diagnosis not present

## 2022-08-03 DIAGNOSIS — N186 End stage renal disease: Secondary | ICD-10-CM | POA: Diagnosis not present

## 2022-08-05 ENCOUNTER — Other Ambulatory Visit (HOSPITAL_COMMUNITY): Payer: Self-pay

## 2022-08-05 ENCOUNTER — Other Ambulatory Visit: Payer: Self-pay

## 2022-08-06 DIAGNOSIS — N2581 Secondary hyperparathyroidism of renal origin: Secondary | ICD-10-CM | POA: Diagnosis not present

## 2022-08-06 DIAGNOSIS — Z992 Dependence on renal dialysis: Secondary | ICD-10-CM | POA: Diagnosis not present

## 2022-08-06 DIAGNOSIS — N186 End stage renal disease: Secondary | ICD-10-CM | POA: Diagnosis not present

## 2022-08-08 DIAGNOSIS — N2581 Secondary hyperparathyroidism of renal origin: Secondary | ICD-10-CM | POA: Diagnosis not present

## 2022-08-08 DIAGNOSIS — N186 End stage renal disease: Secondary | ICD-10-CM | POA: Diagnosis not present

## 2022-08-08 DIAGNOSIS — Z992 Dependence on renal dialysis: Secondary | ICD-10-CM | POA: Diagnosis not present

## 2022-08-10 DIAGNOSIS — I129 Hypertensive chronic kidney disease with stage 1 through stage 4 chronic kidney disease, or unspecified chronic kidney disease: Secondary | ICD-10-CM | POA: Diagnosis not present

## 2022-08-10 DIAGNOSIS — N186 End stage renal disease: Secondary | ICD-10-CM | POA: Diagnosis not present

## 2022-08-10 DIAGNOSIS — N2581 Secondary hyperparathyroidism of renal origin: Secondary | ICD-10-CM | POA: Diagnosis not present

## 2022-08-10 DIAGNOSIS — Z992 Dependence on renal dialysis: Secondary | ICD-10-CM | POA: Diagnosis not present

## 2022-08-12 ENCOUNTER — Ambulatory Visit: Payer: Self-pay | Admitting: *Deleted

## 2022-08-12 NOTE — Telephone Encounter (Signed)
  Chief Complaint: RAsh Symptoms: Red raised rash both eyes, lids "And under eyes and under lids." Some swelling, itchy Frequency: 4 days ago Pertinent Negatives: Patient denies fever, pain, no new meds Disposition: [] ED /[] Urgent Care (no appt availability in office) / [] Appointment(In office/virtual)/ []  [facility]/ [] Home Care/ [] Refused Recommended Disposition /[x] [place] [facility]/ []  Follow-up with PCP Additional Notes: [facility], no availability. States will follow disposition.  Reason for Disposition  [1] Looks infected (spreading redness, pus) AND [2] no fever  Answer Assessment - Initial Assessment Questions 1. APPEARANCE of RASH: "Describe the rash."      Red raised at first, now red and dry 2. LOCATION: "Where is the rash located?"      Upper and lower eyes 3. NUMBER: "How many spots are there?"      Unsure 4. SIZE: "How big are the spots?" (Inches, centimeters or compare to size of a coin)      Diffuse 5. ONSET: "When did the rash start?"      4 days ago 6. ITCHING: "Does the rash itch?" If Yes, ask: "How bad is the itch?"  (Scale 0-10; or none, mild, moderate, severe)     Yes 7. PAIN: "Does the rash hurt?" If Yes, ask: "How bad is the pain?"  (Scale 0-10; or none, mild, moderate, severe)    - NONE (0): no pain    - MILD (1-3): doesn't interfere with normal activities     - MODERATE (4-7): interferes with normal activities or awakens from sleep     - SEVERE (8-10): excruciating pain, unable to do any normal activities     No 8. OTHER SYMPTOMS: "Do you have any other symptoms?" (e.g., fever)     no  Protocols used: Rash or Redness - Localized-A-AH

## 2022-08-12 NOTE — Telephone Encounter (Signed)
Patient schedule with Dr. Joya Gaskins on 08/14/22

## 2022-08-13 DIAGNOSIS — N186 End stage renal disease: Secondary | ICD-10-CM | POA: Diagnosis not present

## 2022-08-13 DIAGNOSIS — N2581 Secondary hyperparathyroidism of renal origin: Secondary | ICD-10-CM | POA: Diagnosis not present

## 2022-08-13 DIAGNOSIS — Z992 Dependence on renal dialysis: Secondary | ICD-10-CM | POA: Diagnosis not present

## 2022-08-14 ENCOUNTER — Encounter (HOSPITAL_COMMUNITY): Payer: Self-pay

## 2022-08-14 ENCOUNTER — Encounter: Payer: Self-pay | Admitting: Critical Care Medicine

## 2022-08-14 ENCOUNTER — Ambulatory Visit: Payer: Medicare HMO | Attending: Critical Care Medicine | Admitting: Critical Care Medicine

## 2022-08-14 ENCOUNTER — Other Ambulatory Visit: Payer: Self-pay

## 2022-08-14 ENCOUNTER — Telehealth: Payer: Self-pay

## 2022-08-14 VITALS — BP 162/73 | HR 64 | Ht 62.0 in | Wt 162.0 lb

## 2022-08-14 DIAGNOSIS — J810 Acute pulmonary edema: Secondary | ICD-10-CM

## 2022-08-14 DIAGNOSIS — I5032 Chronic diastolic (congestive) heart failure: Secondary | ICD-10-CM | POA: Diagnosis not present

## 2022-08-14 DIAGNOSIS — N186 End stage renal disease: Secondary | ICD-10-CM

## 2022-08-14 DIAGNOSIS — J302 Other seasonal allergic rhinitis: Secondary | ICD-10-CM | POA: Diagnosis not present

## 2022-08-14 DIAGNOSIS — I700 Atherosclerosis of aorta: Secondary | ICD-10-CM

## 2022-08-14 DIAGNOSIS — J309 Allergic rhinitis, unspecified: Secondary | ICD-10-CM

## 2022-08-14 DIAGNOSIS — N2581 Secondary hyperparathyroidism of renal origin: Secondary | ICD-10-CM

## 2022-08-14 DIAGNOSIS — Z77120 Contact with and (suspected) exposure to mold (toxic): Secondary | ICD-10-CM | POA: Diagnosis not present

## 2022-08-14 DIAGNOSIS — J3089 Other allergic rhinitis: Secondary | ICD-10-CM

## 2022-08-14 DIAGNOSIS — J849 Interstitial pulmonary disease, unspecified: Secondary | ICD-10-CM

## 2022-08-14 DIAGNOSIS — F1193 Opioid use, unspecified with withdrawal: Secondary | ICD-10-CM | POA: Diagnosis not present

## 2022-08-14 DIAGNOSIS — H1013 Acute atopic conjunctivitis, bilateral: Secondary | ICD-10-CM | POA: Diagnosis not present

## 2022-08-14 DIAGNOSIS — I480 Paroxysmal atrial fibrillation: Secondary | ICD-10-CM

## 2022-08-14 DIAGNOSIS — R6889 Other general symptoms and signs: Secondary | ICD-10-CM | POA: Diagnosis not present

## 2022-08-14 DIAGNOSIS — E118 Type 2 diabetes mellitus with unspecified complications: Secondary | ICD-10-CM

## 2022-08-14 DIAGNOSIS — H1010 Acute atopic conjunctivitis, unspecified eye: Secondary | ICD-10-CM

## 2022-08-14 HISTORY — DX: Interstitial pulmonary disease, unspecified: J84.9

## 2022-08-14 HISTORY — DX: Secondary hyperparathyroidism of renal origin: N25.81

## 2022-08-14 MED ORDER — CETIRIZINE HCL 10 MG PO TABS
10.0000 mg | ORAL_TABLET | Freq: Every day | ORAL | 11 refills | Status: DC
  Filled 2022-08-14: qty 30, 30d supply, fill #0

## 2022-08-14 MED ORDER — FLUTICASONE PROPIONATE 50 MCG/ACT NA SUSP
2.0000 | Freq: Every day | NASAL | 6 refills | Status: DC
Start: 2022-08-14 — End: 2023-01-08
  Filled 2022-08-14: qty 16, 30d supply, fill #0
  Filled 2022-09-09: qty 16, 30d supply, fill #1
  Filled 2022-09-30 – 2022-10-07 (×2): qty 16, 30d supply, fill #2
  Filled 2022-10-24 – 2022-11-09 (×3): qty 16, 30d supply, fill #3
  Filled 2022-11-24 – 2022-12-08 (×2): qty 16, 30d supply, fill #4
  Filled 2023-01-05: qty 16, 30d supply, fill #5

## 2022-08-14 MED ORDER — OLOPATADINE HCL 0.1 % OP SOLN
1.0000 [drp] | Freq: Two times a day (BID) | OPHTHALMIC | 12 refills | Status: AC
  Filled 2022-08-14: qty 5, 50d supply, fill #0
  Filled 2022-09-01: qty 5, 25d supply, fill #0
  Filled 2022-09-30: qty 5, 50d supply, fill #0
  Filled 2022-10-14: qty 5, 25d supply, fill #1
  Filled 2022-10-27 – 2022-12-29 (×2): qty 5, 25d supply, fill #2
  Filled 2023-02-04: qty 5, 25d supply, fill #3

## 2022-08-14 MED ORDER — AZELASTINE HCL 0.1 % NA SOLN
2.0000 | Freq: Two times a day (BID) | NASAL | 12 refills | Status: DC
  Filled 2022-08-14: qty 30, 25d supply, fill #0
  Filled 2022-09-09: qty 30, 25d supply, fill #1
  Filled 2022-09-30: qty 30, 25d supply, fill #2
  Filled 2022-10-24: qty 30, 25d supply, fill #3
  Filled 2022-11-24: qty 30, 25d supply, fill #4
  Filled 2022-12-29: qty 30, 25d supply, fill #5

## 2022-08-14 NOTE — Assessment & Plan Note (Signed)
Progressive conjunctivitis start Patanol eyedrops

## 2022-08-14 NOTE — Telephone Encounter (Signed)
Referral sent to [facility] care coordination requesting assistance with obtaining Nepro protein drinks if  possible

## 2022-08-14 NOTE — Progress Notes (Signed)
Acute Office Visit  Subjective:     Patient ID: Paula [name], female    DOB: 01-15-1946, 77 y.o.   MRN: [id number]  Chief Complaint  Patient presents with   Itching    Itching from nose to forehead.     Primary care patient of Dr. Margarita Rana 77 year old female who actually saw when I was in my pulmonary practice 10 years ago for chronic cough.  Patient comes in today with nasal congestion itchy eyes redness in the eyes and cough.  She is now on hemodialysis with improvement in the cough to some degree.  On arrival blood pressure is elevated.  She is trying to access nephro nutrition cannot afford this.  Patient also is living in a house full of mold.  Cough is minimally productive.  Eyes are itching.  Skin is itching around the eyes.  There is no purulent drainage    Review of Systems  Constitutional:  Negative for chills, diaphoresis, fever, malaise/fatigue and weight loss.  HENT:  Positive for congestion. Negative for hearing loss, nosebleeds, sore throat and tinnitus.   Eyes:  Positive for redness. Negative for blurred vision and photophobia.  Respiratory:  Positive for cough. Negative for hemoptysis, sputum production, shortness of breath, wheezing and stridor.   Cardiovascular:  Negative for chest pain, palpitations, orthopnea, claudication, leg swelling and PND.  Gastrointestinal:  Negative for abdominal pain, blood in stool, constipation, diarrhea, heartburn, nausea and vomiting.  Genitourinary:  Negative for dysuria, flank pain, frequency, hematuria and urgency.  Musculoskeletal:  Negative for back pain, falls, joint pain, myalgias and neck pain.  Skin:  Negative for itching and rash.  Neurological:  Negative for dizziness, tingling, tremors, sensory change, speech change, focal weakness, seizures, loss of consciousness, weakness and headaches.  Endo/Heme/Allergies:  Negative for environmental allergies and polydipsia. Does not bruise/bleed easily.  Psychiatric/Behavioral:   Negative for depression, memory loss, substance abuse and suicidal ideas. The patient is not nervous/anxious and does not have insomnia.         Objective:    BP (!) 162/73   Pulse 64   Ht 5\' 2"  (1.575 m)   Wt 162 lb (73.5 kg)   LMP  (LMP Unknown)   SpO2 97%   BMI 29.63 kg/m    Physical Exam Vitals reviewed.  Constitutional:      Appearance: Normal appearance. She is well-developed. She is not diaphoretic.  HENT:     Head: Normocephalic and atraumatic.     Right Ear: Tympanic membrane normal.     Left Ear: Tympanic membrane normal.     Nose: Congestion and rhinorrhea present. No nasal deformity, septal deviation or mucosal edema.     Right Sinus: No maxillary sinus tenderness or frontal sinus tenderness.     Left Sinus: No maxillary sinus tenderness or frontal sinus tenderness.     Mouth/Throat:     Mouth: Mucous membranes are moist.     Pharynx: Oropharynx is clear. No oropharyngeal exudate or posterior oropharyngeal erythema.  Eyes:     General: No scleral icterus.    Pupils: Pupils are equal, round, and reactive to light.     Comments: Allergic rhinitis symptom complex with red eyes and inflamed conjunctiva  Neck:     Thyroid: No thyromegaly.     Vascular: No carotid bruit or JVD.     Trachea: Trachea normal. No tracheal tenderness or tracheal deviation.  Cardiovascular:     Rate and Rhythm: Normal rate and regular rhythm.  Chest Wall: PMI is not displaced.     Pulses: Normal pulses. No decreased pulses.     Heart sounds: Normal heart sounds, S1 normal and S2 normal. Heart sounds not distant. No murmur heard.    No systolic murmur is present.     No diastolic murmur is present.     No friction rub. No gallop. No S3 or S4 sounds.  Pulmonary:     Effort: No tachypnea, accessory muscle usage or respiratory distress.     Breath sounds: No stridor. No decreased breath sounds, wheezing, rhonchi or rales.  Chest:     Chest wall: No tenderness.  Abdominal:      General: Bowel sounds are normal. There is no distension.     Palpations: Abdomen is soft. Abdomen is not rigid.     Tenderness: There is no abdominal tenderness. There is no guarding or rebound.  Musculoskeletal:        General: Normal range of motion.     Cervical back: Normal range of motion and neck supple. No edema, erythema or rigidity. No muscular tenderness. Normal range of motion.  Lymphadenopathy:     Head:     Right side of head: No submental or submandibular adenopathy.     Left side of head: No submental or submandibular adenopathy.     Cervical: No cervical adenopathy.  Skin:    General: Skin is warm and dry.     Coloration: Skin is not pale.     Findings: No rash.     Nails: There is no clubbing.  Neurological:     Mental Status: She is alert and oriented to person, place, and time.     Sensory: No sensory deficit.  Psychiatric:        Speech: Speech normal.        Behavior: Behavior normal.     No results found for any visits on 08/14/22.      Assessment & Plan:   Problem List Items Addressed This Visit       Respiratory   Seasonal and perennial allergic rhinitis    Suspect mold exposure playing a role we will switch to cetirizine 10 mg daily along with Astelin and Flonase nasal spray and Patanol eyedrops  Check mold panel and if positive would benefit from legal aid assistance to have apartment cleaned        Other   Seasonal allergic conjunctivitis    Progressive conjunctivitis start Patanol eyedrops      Mold exposure   Relevant Orders   Allergen Profile, Mold   IgE   Other Visit Diagnoses     Allergic conjunctivitis and rhinitis, bilateral    -  Primary       Meds ordered this encounter  Medications   olopatadine (PATANOL) 0.1 % ophthalmic solution    Sig: Place 1 drop into both eyes 2 (two) times daily.    Dispense:  5 mL    Refill:  12   cetirizine (ZYRTEC) 10 MG tablet    Sig: Take 1 tablet (10 mg total) by mouth daily.     Dispense:  30 tablet    Refill:  11   azelastine (ASTELIN) 0.1 % nasal spray    Sig: Place 2 sprays into both nostrils 2 (two) times daily. Use in each nostril as directed    Dispense:  30 mL    Refill:  12   fluticasone (FLONASE) 50 MCG/ACT nasal spray    Sig: Place 2 sprays  into both nostrils daily.    Dispense:  16 g    Refill:  6    Return if symptoms worsen or fail to improve.  Asencion Noble, MD

## 2022-08-14 NOTE — Progress Notes (Signed)
Paula [name]

## 2022-08-14 NOTE — Assessment & Plan Note (Signed)
Now on hemodialysis

## 2022-08-14 NOTE — Assessment & Plan Note (Signed)
Suspect mold exposure playing a role we will switch to cetirizine 10 mg daily along with Astelin and Flonase nasal spray and Patanol eyedrops  Check mold panel and if positive would benefit from legal aid assistance to have apartment cleaned

## 2022-08-14 NOTE — Patient Instructions (Signed)
Stop loratadine and begin cetirizine 1 pill daily for allergies  Start fluticasone 2 sprays each nostril twice daily  Start Astelin 2 sprays each nostril twice daily  Labs today include a mold panel to see if you are allergic to mold  I will ask our case manager Eden Lathe to get you connected with legal aid regarding the apartment conditions  Keep your upcoming complete physical and Medicare wellness exam with Dr. Margarita Rana

## 2022-08-15 ENCOUNTER — Other Ambulatory Visit: Payer: Self-pay

## 2022-08-15 ENCOUNTER — Telehealth: Payer: Self-pay | Admitting: *Deleted

## 2022-08-15 DIAGNOSIS — N2581 Secondary hyperparathyroidism of renal origin: Secondary | ICD-10-CM | POA: Diagnosis not present

## 2022-08-15 DIAGNOSIS — Z992 Dependence on renal dialysis: Secondary | ICD-10-CM | POA: Diagnosis not present

## 2022-08-15 DIAGNOSIS — N186 End stage renal disease: Secondary | ICD-10-CM | POA: Diagnosis not present

## 2022-08-15 NOTE — Progress Notes (Signed)
  Care Coordination  Outreach Note  08/15/2022 Name: Paula [name] MRN: [id number] DOB: 06/04/1946   Care Coordination Outreach Attempts: An unsuccessful telephone outreach was attempted today to offer the patient information about available care coordination services as a benefit of their health plan.   Follow Up Plan:  Additional outreach attempts will be made to offer the patient care coordination information and services.   Encounter Outcome:  No Answer  [place]  Direct Dial: [phone]

## 2022-08-16 LAB — ALLERGEN PROFILE, MOLD
Alternaria Alternata IgE: <0.1 kU/L
Aspergillus Fumigatus IgE: <0.1 kU/L
Aureobasidi Pullulans IgE: <0.1 kU/L
Candida Albicans IgE: <0.1 kU/L
Cladosporium Herbarum IgE: <0.1 kU/L
M009-IgE Fusarium proliferatum: <0.1 kU/L
M014-IgE Epicoccum purpur: <0.1 kU/L
Mucor Racemosus IgE: <0.1 kU/L
Penicillium Chrysogen IgE: <0.1 kU/L
Phoma Betae IgE: <0.1 kU/L
Setomelanomma Rostrat: <0.1 kU/L
Stemphylium Herbarum IgE: <0.1 kU/L

## 2022-08-16 LAB — IGE: IgE (Immunoglobulin E), Serum: 40 IU/mL (ref 6–495)

## 2022-08-17 NOTE — Progress Notes (Signed)
Let pt know good news no mold allergy

## 2022-08-18 ENCOUNTER — Encounter: Payer: Self-pay | Admitting: Critical Care Medicine

## 2022-08-18 ENCOUNTER — Telehealth: Payer: Self-pay | Admitting: Family Medicine

## 2022-08-18 ENCOUNTER — Telehealth: Payer: Self-pay

## 2022-08-18 DIAGNOSIS — N186 End stage renal disease: Secondary | ICD-10-CM | POA: Diagnosis not present

## 2022-08-18 DIAGNOSIS — N2581 Secondary hyperparathyroidism of renal origin: Secondary | ICD-10-CM | POA: Diagnosis not present

## 2022-08-18 DIAGNOSIS — Z992 Dependence on renal dialysis: Secondary | ICD-10-CM | POA: Diagnosis not present

## 2022-08-18 NOTE — Telephone Encounter (Signed)
-----   Message from Elsie Stain, MD sent at 08/17/2022  9:14 AM EST ----- Let pt know good news no mold allergy

## 2022-08-18 NOTE — Telephone Encounter (Signed)
Daughter given lab results, verbalizes understanding.

## 2022-08-18 NOTE — Telephone Encounter (Signed)
Pt was called and vm was left, Information has been sent to nurse pool.   

## 2022-08-18 NOTE — Progress Notes (Unsigned)
  Care Coordination  Outreach Note  08/18/2022 Name: Paula [name] MRN: [id number] DOB: 1946-08-09   Care Coordination Outreach Attempts: A second unsuccessful outreach was attempted today to offer the patient with information about available care coordination services as a benefit of their health plan.     Follow Up Plan:  Additional outreach attempts will be made to offer the patient care coordination information and services.   Encounter Outcome:  No Answer  [place]  Direct Dial: [phone]

## 2022-08-19 NOTE — Progress Notes (Signed)
  Care Coordination  Outreach Note  08/19/2022 Name: Paula [name] MRN: [id number] DOB: 1945/10/22   Care Coordination Outreach Attempts: A third unsuccessful outreach was attempted today to offer the patient with information about available care coordination services as a benefit of their health plan.   Follow Up Plan:  No further outreach attempts will be made at this time. We have been unable to contact the patient to offer or enroll patient in care coordination services  Encounter Outcome:  No Answer  [place]: [phone]

## 2022-08-20 ENCOUNTER — Other Ambulatory Visit (HOSPITAL_COMMUNITY): Payer: Self-pay

## 2022-08-20 ENCOUNTER — Ambulatory Visit: Payer: Medicare HMO | Admitting: Family Medicine

## 2022-08-20 DIAGNOSIS — N2581 Secondary hyperparathyroidism of renal origin: Secondary | ICD-10-CM | POA: Diagnosis not present

## 2022-08-20 DIAGNOSIS — Z992 Dependence on renal dialysis: Secondary | ICD-10-CM | POA: Diagnosis not present

## 2022-08-20 DIAGNOSIS — N186 End stage renal disease: Secondary | ICD-10-CM | POA: Diagnosis not present

## 2022-08-20 MED ORDER — AMLODIPINE BESYLATE 5 MG PO TABS
5.0000 mg | ORAL_TABLET | Freq: Every day | ORAL | 3 refills | Status: DC
  Filled 2022-08-20: qty 30, 30d supply, fill #0

## 2022-08-21 ENCOUNTER — Other Ambulatory Visit (HOSPITAL_COMMUNITY): Payer: Self-pay

## 2022-08-22 DIAGNOSIS — N186 End stage renal disease: Secondary | ICD-10-CM | POA: Diagnosis not present

## 2022-08-22 DIAGNOSIS — N2581 Secondary hyperparathyroidism of renal origin: Secondary | ICD-10-CM | POA: Diagnosis not present

## 2022-08-22 DIAGNOSIS — Z992 Dependence on renal dialysis: Secondary | ICD-10-CM | POA: Diagnosis not present

## 2022-08-25 DIAGNOSIS — Z992 Dependence on renal dialysis: Secondary | ICD-10-CM | POA: Diagnosis not present

## 2022-08-25 DIAGNOSIS — N186 End stage renal disease: Secondary | ICD-10-CM | POA: Diagnosis not present

## 2022-08-25 DIAGNOSIS — N2581 Secondary hyperparathyroidism of renal origin: Secondary | ICD-10-CM | POA: Diagnosis not present

## 2022-08-26 ENCOUNTER — Ambulatory Visit (INDEPENDENT_AMBULATORY_CARE_PROVIDER_SITE_OTHER): Payer: Medicare HMO | Admitting: Emergency Medicine

## 2022-08-26 ENCOUNTER — Encounter: Payer: Self-pay | Admitting: Emergency Medicine

## 2022-08-26 VITALS — BP 124/72 | HR 74 | Temp 98.4°F | Ht 62.0 in | Wt 158.8 lb

## 2022-08-26 DIAGNOSIS — R053 Chronic cough: Secondary | ICD-10-CM | POA: Diagnosis not present

## 2022-08-26 DIAGNOSIS — R6889 Other general symptoms and signs: Secondary | ICD-10-CM | POA: Diagnosis not present

## 2022-08-26 NOTE — Assessment & Plan Note (Signed)
Chronic cough with likely precipitants GERD and chronic rhinitis.  She has significant drainage and clear congestion.  Sometimes she does benefit from albuterol nebs.  She was just restarted on Flonase, Astelin, loratadine and Protonix.  Agree with all of this.  She has not benefited from Neurontin in the past.  I think she needs repeat pulmonary function testing to ensure there is no evidence of lower airways disease especially because she complains of dyspnea associated with her cough.  We will arrange for pulmonary function testing at your next office visit Okay to continue to use albuterol nebulizer as needed for shortness of breath, spells of coughing. Continue Flonase (fluticasone) nasal spray once daily Continue Astelin (azelastine) nasal spray 2 times a day Continue loratadine 10 mg once daily Continue Protonix 40 mg once daily.  Take this medication 1 hour around food. Follow Dr. Lamonte Sakai next available with PFT on the same day.

## 2022-08-26 NOTE — Patient Instructions (Addendum)
We will arrange for pulmonary function testing at your next office visit Okay to continue to use albuterol nebulizer as needed for shortness of breath, spells of coughing. Continue Flonase (fluticasone) nasal spray once daily Continue Astelin (azelastine) nasal spray 2 times a day Continue loratadine 10 mg once daily Continue Protonix 40 mg once daily.  Take this medication 1 hour around food. Follow Dr. Lamonte Sakai next available with PFT on the same day.

## 2022-08-26 NOTE — Progress Notes (Signed)
Subjective:    Patient ID: Paula [name], female    DOB: 1945/08/30, 77 y.o.   MRN: [id number]  Cough Associated symptoms include postnasal drip. Pertinent negatives include no rash, sore throat, shortness of breath or wheezing.   ROV 03/15/21 --follow-up visit for 77 year old woman with a history of diabetes and hypertension.  She has chronic upper airway irritation syndrome and chronic cough in the setting of allergic rhinitis and GERD.  Her pulmonary function testing has been reassuring.  She underwent bronchoscopy that did not show any evidence of endobronchial lesion or other abnormality.  ENT evaluation in the past has been reassuring as well.  She was apparently admitted with hypertensive urgency in mid July 2022.  She tells me that her breathing has worsened since last time, having nasal obstruction. She is unable to sleep supine. Lots of clear mucous. She is having daily cough.   ROV 08/26/2022 --Ms. Paula [name] is 89 with a history of diabetes, hypertension, end-stage renal disease, allergic rhinitis, GERD.  I seen her in the past for chronic upper airway irritation syndrome and cough.  She has had reassuring pulmonary function testing in 12/2017.  High-resolution CT scan of the chest 05/2021 was reassuring.  Last seen in our office 06/2022 and asked to try chlorpheniramine as needed.  She is on Protonix 40 mg daily (just restarted), Flonase once daily, loratadine, Astelin nasal spray (just started the nasal sprays).  She has albuterol nebs which she uses approximately 0-1x a day.  She was tried in the past on Neurontin   Review of Systems  HENT:  Positive for congestion, postnasal drip and sneezing. Negative for sinus pressure and sore throat.   Respiratory:  Positive for cough. Negative for chest tightness, shortness of breath and wheezing.   Cardiovascular:  Negative for palpitations.  Skin:  Negative for rash.  Allergic/Immunologic: Negative.   Psychiatric/Behavioral:  The patient  is not nervous/anxious.     Past Medical History:  Diagnosis Date   Allergic rhinitis    Arthritis    Asthma    Brain aneurysm    Chronic kidney disease    Cough    Diabetes mellitus    Diastolic CHF, chronic (HCC) 10/11/2011   GERD (gastroesophageal reflux disease)    History of colon polyps 2012   tubular adenoma    Hyperlipidemia    Hypertension    Neuropathy 10/11/2011   PONV (postoperative nausea and vomiting)    one time after lymph node surgery     Family History  Problem Relation Age of Onset   Hypertension Mother    Asthma Sister    Colon cancer Neg Hx    Allergic rhinitis Neg Hx    Eczema Neg Hx      Social History   Socioeconomic History   Marital status: Divorced    Spouse name: Not on file   Number of children: 5   Years of education: Not on file   Highest education level: Master's degree (e.g., MA, MS, MEng, MEd, MSW, MBA)  Occupational History   Occupation: Unemployed  Tobacco Use   Smoking status: Never    Passive exposure: Never   Smokeless tobacco: Never  Vaping Use   Vaping Use: Never used  Substance and Sexual Activity   Alcohol use: No   Drug use: No   Sexual activity: Not Currently    Birth control/protection: Post-menopausal, None  Other Topics Concern   Not on file  Social History Narrative   Lives alone.  5 children with two deceased.     Social Determinants of Health   Financial Resource Strain: Not on file  Food Insecurity: Not on file  Transportation Needs: Not on file  Physical Activity: Not on file  Stress: Not on file  Social Connections: Not on file  Intimate Partner Violence: Not on file  Originally from [name], has lived [place], [place], [place] Has worked as Education officer, museum   Allergies  Allergen Reactions   Dust Mite Extract Cough   Peanut-Containing Drug Products Cough   Ace Inhibitors Cough   Lovastatin Other (See Comments)    Generalized body pain     Outpatient Medications Prior to Visit  Medication Sig Dispense  Refill   acetaminophen (TYLENOL) 500 MG tablet Take 500 mg by mouth every 6 (six) hours as needed for fever, moderate pain or mild pain.     albuterol (PROAIR HFA) 108 (90 Base) MCG/ACT inhaler Inhale 1-2 puffs into the lungs every 6 (six) hours as needed for wheezing or shortness of breath. 6.7 g 2   albuterol (PROVENTIL) (2.5 MG/3ML) 0.083% nebulizer solution Take 3 mLs (1 vial) by nebulization every 6 hours as needed for wheezing or shortness of breath. 90 mL 1   amLODipine (NORVASC) 5 MG tablet Take 1 tablet (5 mg total) by mouth daily. 90 tablet 1   apixaban (ELIQUIS) 5 MG TABS tablet Take 1 tablet by mouth 2 times daily. 60 tablet 3   atorvastatin (LIPITOR) 80 MG tablet Take 1 tablet (80 mg total) by mouth daily. 90 tablet 1   azelastine (ASTELIN) 0.1 % nasal spray Place 2 sprays into both nostrils 2 (two) times daily. Use in each nostril as directed 30 mL 12   calcium acetate (PHOSLO) 667 MG capsule Take 1 capsule by mouth 3 times daily 270 capsule 6   carvedilol (COREG) 12.5 MG tablet Take 1 tablet (12.5 mg total) by mouth 2 (two) times daily with a meal. (Take once a day in the evening on hemodialysis days) 180 tablet 1   ethyl chloride spray Apply a small amount on skin immediately before cannulation 116 mL PRN   fluticasone (FLONASE) 50 MCG/ACT nasal spray Place 2 sprays into both nostrils daily. 16 g 6   gabapentin (NEURONTIN) 100 MG capsule Take 1 capsule (100 mg total) by mouth 3 (three) times daily. 90 capsule 1   glipiZIDE (GLUCOTROL XL) 2.5 MG 24 hr tablet Take 1 tablet (2.5 mg total) by mouth daily with breakfast. (needs office visit) 30 tablet 3   glucose blood (ACCU-CHEK GUIDE) test strip USE AS INSTRUCTED 2 TIMES DAILY 100 each 2   Homeopathic Products (EARACHE DROPS OT) Place 1 drop into both ears daily.     isosorbide mononitrate (IMDUR) 60 MG 24 hr tablet TAKE 1 TABLET (60 MG TOTAL) BY MOUTH DAILY. 90 tablet 1   lidocaine-prilocaine (EMLA) cream Apply a small amount to  dialysis access one hour prior to dialysis. Wrap with plastic wrap. 30 g PRN   loratadine (CLARITIN) 10 MG tablet Take 10 mg by mouth daily.     Misc. Devices [employer].  Diagnosis - unstable gait 1 each 0   Misc. Devices MISC Fistula sleeve for R arm.  Diagnoses: End-stage renal disease on hemodialysis 1 each 0   Misc. Devices MISC 1. Incontinence supplies.  2. Briefs.  Diagnosis-urinary incontinence 1 each 11   Misc. Devices MISC Nepro dialysis protein drink.  Diagnosis end-stage renal disease 30 each 11   Misc. Devices MISC Blood  pressure monitor.  Diagnosis hypertension 1 each 0   olopatadine (PATANOL) 0.1 % ophthalmic solution Place 1 drop into both eyes 2 (two) times daily. 5 mL 12   pantoprazole (PROTONIX) 40 MG tablet Take 40 mg by mouth daily.     Polyethyl Glycol-Propyl Glycol (SYSTANE OP) Place 1 drop into both eyes daily.     cetirizine (ZYRTEC) 10 MG tablet Take 1 tablet (10 mg total) by mouth daily. 30 tablet 11   amLODipine (NORVASC) 5 MG tablet Take 1 tablet (5 mg total) by mouth daily. 30 tablet 3   No facility-administered medications prior to visit.        Objective:   Physical Exam  Vitals:   08/26/22 1441  BP: 124/72  Pulse: 74  Temp: 98.4 F (36.9 C)  TempSrc: Oral  SpO2: 99%  Weight: 158 lb 12.8 oz (72 kg)  Height: 5\' 2"  (1.575 m)   Gen: Pleasant, well-nourished, in no distress,  normal affect  ENT: No lesions,  mouth clear,  oropharynx clear, nasal passages narrow  Neck: No JVD, no stridor  Lungs: No use of accessory muscles, no wheezing, no crackles  Cardiovascular: RRR, heart sounds normal, no murmur or gallops, no peripheral edema  Musculoskeletal: No deformities, no cyanosis or clubbing  Neuro: alert, non focal  Skin: Warm, no lesions or rashes                                                                           Assessment & Plan:  Cough Chronic cough with likely precipitants GERD and chronic rhinitis.  She has significant  drainage and clear congestion.  Sometimes she does benefit from albuterol nebs.  She was just restarted on Flonase, Astelin, loratadine and Protonix.  Agree with all of this.  She has not benefited from Neurontin in the past.  I think she needs repeat pulmonary function testing to ensure there is no evidence of lower airways disease especially because she complains of dyspnea associated with her cough.  We will arrange for pulmonary function testing at your next office visit Okay to continue to use albuterol nebulizer as needed for shortness of breath, spells of coughing. Continue Flonase (fluticasone) nasal spray once daily Continue Astelin (azelastine) nasal spray 2 times a day Continue loratadine 10 mg once daily Continue Protonix 40 mg once daily.  Take this medication 1 hour around food. Follow Dr. Lamonte Sakai next available with PFT on the same day.  Baltazar Apo, MD, PhD 08/26/2022, 2:56 PM [facility] [phone] or if no answer [phone]

## 2022-08-26 NOTE — Addendum Note (Signed)
Addended by: Lorretta Harp on: 08/26/2022 02:58 PM   Modules accepted: Orders

## 2022-08-27 DIAGNOSIS — Z992 Dependence on renal dialysis: Secondary | ICD-10-CM | POA: Diagnosis not present

## 2022-08-27 DIAGNOSIS — N2581 Secondary hyperparathyroidism of renal origin: Secondary | ICD-10-CM | POA: Diagnosis not present

## 2022-08-27 DIAGNOSIS — N186 End stage renal disease: Secondary | ICD-10-CM | POA: Diagnosis not present

## 2022-08-29 DIAGNOSIS — N2581 Secondary hyperparathyroidism of renal origin: Secondary | ICD-10-CM | POA: Diagnosis not present

## 2022-08-29 DIAGNOSIS — N186 End stage renal disease: Secondary | ICD-10-CM | POA: Diagnosis not present

## 2022-08-29 DIAGNOSIS — Z992 Dependence on renal dialysis: Secondary | ICD-10-CM | POA: Diagnosis not present

## 2022-09-01 ENCOUNTER — Other Ambulatory Visit: Payer: Self-pay

## 2022-09-01 ENCOUNTER — Other Ambulatory Visit: Payer: Self-pay | Admitting: Family Medicine

## 2022-09-01 ENCOUNTER — Other Ambulatory Visit (HOSPITAL_COMMUNITY): Payer: Self-pay

## 2022-09-01 DIAGNOSIS — N2581 Secondary hyperparathyroidism of renal origin: Secondary | ICD-10-CM | POA: Diagnosis not present

## 2022-09-01 DIAGNOSIS — E1122 Type 2 diabetes mellitus with diabetic chronic kidney disease: Secondary | ICD-10-CM

## 2022-09-01 DIAGNOSIS — N186 End stage renal disease: Secondary | ICD-10-CM | POA: Diagnosis not present

## 2022-09-01 DIAGNOSIS — Z992 Dependence on renal dialysis: Secondary | ICD-10-CM | POA: Diagnosis not present

## 2022-09-01 DIAGNOSIS — E1149 Type 2 diabetes mellitus with other diabetic neurological complication: Secondary | ICD-10-CM

## 2022-09-02 ENCOUNTER — Other Ambulatory Visit: Payer: Self-pay

## 2022-09-02 ENCOUNTER — Other Ambulatory Visit (HOSPITAL_COMMUNITY): Payer: Self-pay

## 2022-09-02 MED ORDER — LORATADINE 10 MG PO TABS
10.0000 mg | ORAL_TABLET | Freq: Every day | ORAL | 1 refills | Status: DC
  Filled 2022-09-02 – 2022-09-17 (×2): qty 90, 90d supply, fill #0
  Filled 2022-10-27 – 2022-12-23 (×2): qty 90, 90d supply, fill #1

## 2022-09-02 NOTE — Telephone Encounter (Signed)
Unable to refill per protocol, Rx request is too soon. Last refill 05/06/22 for 90 and 1 refill.  Requested Prescriptions  Pending Prescriptions Disp Refills   atorvastatin (LIPITOR) 80 MG tablet 90 tablet 1    Sig: Take 1 tablet (80 mg total) by mouth daily.     Cardiovascular:  Antilipid - Statins Failed - 09/01/2022 11:12 AM      Failed - Lipid Panel in normal range within the last 12 months    Cholesterol, Total  Date Value Ref Range Status  06/17/2021 121 100 - 199 mg/dL Final   Cholesterol  Date Value Ref Range Status  09/01/2021 106 0 - 200 mg/dL Final   LDL Chol Calc (NIH)  Date Value Ref Range Status  06/17/2021 61 0 - 99 mg/dL Final   LDL Cholesterol  Date Value Ref Range Status  09/01/2021 48 0 - 99 mg/dL Final    Comment:           Total Cholesterol/HDL:CHD Risk Coronary Heart Disease Risk Table                     Men   Women  1/2 Average Risk   3.4   3.3  Average Risk       5.0   4.4  2 X Average Risk   9.6   7.1  3 X Average Risk  23.4   11.0        Use the calculated Patient Ratio above and the CHD Risk Table to determine the patient's CHD Risk.        ATP III CLASSIFICATION (LDL):  <100     mg/dL   Optimal  100-129  mg/dL   Near or Above                    Optimal  130-159  mg/dL   Borderline  160-189  mg/dL   High  >190     mg/dL   Very High Performed at [facility] [address], [place], [place] [address]    HDL  Date Value Ref Range Status  09/01/2021 46 >40 mg/dL Final  06/17/2021 46 >39 mg/dL Final   Triglycerides  Date Value Ref Range Status  09/01/2021 61 <150 mg/dL Final         Passed - Patient is not pregnant      Passed - Valid encounter within last 12 months    Recent Outpatient Visits           2 weeks ago Allergic conjunctivitis and rhinitis, bilateral   [place] Elsie Stain, MD   3 months ago Controlled type 2 diabetes mellitus with chronic kidney disease, without  long-term current use of insulin, unspecified CKD stage [facility])   [place], Charlane Ferretti, MD   7 months ago Controlled type 2 diabetes mellitus with chronic kidney disease, without long-term current use of insulin, unspecified CKD stage [facility])   [place], [place], MD   9 months ago Type 2 diabetes mellitus with other neurologic complication, without long-term current use of insulin ([place])   [place] Charlott Rakes, MD   11 months ago Other insomnia   [place], Charlane Ferretti, MD               isosorbide mononitrate (IMDUR) 60 MG  24 hr tablet 90 tablet 1    Sig: TAKE 1 TABLET (60 MG TOTAL) BY MOUTH DAILY.     Cardiovascular:  Nitrates Passed - 09/01/2022 11:12 AM      Passed - Last BP in normal range    BP Readings from Last 1 Encounters:  08/26/22 124/72         Passed - Last Heart Rate in normal range    Pulse Readings from Last 1 Encounters:  08/26/22 74         Passed - Valid encounter within last 12 months    Recent Outpatient Visits           2 weeks ago Allergic conjunctivitis and rhinitis, bilateral   [place] Elsie Stain, MD   3 months ago Controlled type 2 diabetes mellitus with chronic kidney disease, without long-term current use of insulin, unspecified CKD stage [facility])   [place], [place], MD   7 months ago Controlled type 2 diabetes mellitus with chronic kidney disease, without long-term current use of insulin, unspecified CKD stage [facility])   [place], [place], MD   9 months ago Type 2 diabetes mellitus with other neurologic complication, without long-term current use of insulin ([place])   [place], Charlane Ferretti, MD   11 months ago Other  insomnia   [place] [facility] Charlott Rakes, MD

## 2022-09-03 ENCOUNTER — Other Ambulatory Visit (HOSPITAL_COMMUNITY): Payer: Self-pay

## 2022-09-03 DIAGNOSIS — N2581 Secondary hyperparathyroidism of renal origin: Secondary | ICD-10-CM | POA: Diagnosis not present

## 2022-09-03 DIAGNOSIS — N186 End stage renal disease: Secondary | ICD-10-CM | POA: Diagnosis not present

## 2022-09-03 DIAGNOSIS — Z992 Dependence on renal dialysis: Secondary | ICD-10-CM | POA: Diagnosis not present

## 2022-09-05 DIAGNOSIS — Z992 Dependence on renal dialysis: Secondary | ICD-10-CM | POA: Diagnosis not present

## 2022-09-05 DIAGNOSIS — N186 End stage renal disease: Secondary | ICD-10-CM | POA: Diagnosis not present

## 2022-09-05 DIAGNOSIS — N2581 Secondary hyperparathyroidism of renal origin: Secondary | ICD-10-CM | POA: Diagnosis not present

## 2022-09-08 DIAGNOSIS — Z992 Dependence on renal dialysis: Secondary | ICD-10-CM | POA: Diagnosis not present

## 2022-09-08 DIAGNOSIS — N186 End stage renal disease: Secondary | ICD-10-CM | POA: Diagnosis not present

## 2022-09-08 DIAGNOSIS — N2581 Secondary hyperparathyroidism of renal origin: Secondary | ICD-10-CM | POA: Diagnosis not present

## 2022-09-09 ENCOUNTER — Ambulatory Visit: Payer: Medicare HMO | Attending: Family Medicine | Admitting: Family Medicine

## 2022-09-09 ENCOUNTER — Other Ambulatory Visit (HOSPITAL_COMMUNITY): Payer: Self-pay | Admitting: Interventional Radiology

## 2022-09-09 ENCOUNTER — Encounter: Payer: Self-pay | Admitting: Family Medicine

## 2022-09-09 ENCOUNTER — Other Ambulatory Visit: Payer: Self-pay

## 2022-09-09 VITALS — BP 182/69 | HR 68 | Ht 62.0 in | Wt 159.8 lb

## 2022-09-09 DIAGNOSIS — Z1211 Encounter for screening for malignant neoplasm of colon: Secondary | ICD-10-CM | POA: Diagnosis not present

## 2022-09-09 DIAGNOSIS — Z0000 Encounter for general adult medical examination without abnormal findings: Secondary | ICD-10-CM

## 2022-09-09 DIAGNOSIS — I671 Cerebral aneurysm, nonruptured: Secondary | ICD-10-CM

## 2022-09-09 MED ORDER — TRAZODONE HCL 50 MG PO TABS
50.0000 mg | ORAL_TABLET | Freq: Every evening | ORAL | 1 refills | Status: DC | PRN
  Filled 2022-09-09: qty 30, 30d supply, fill #0
  Filled 2022-09-30: qty 30, 30d supply, fill #1
  Filled 2022-09-30 – 2022-10-27 (×2): qty 30, 30d supply, fill #0

## 2022-09-09 NOTE — Progress Notes (Signed)
Welcome to [employer]

## 2022-09-09 NOTE — Patient Instructions (Signed)
  Ms. Badalamenti , Thank you for taking time to come for your Medicare Wellness Visit. I appreciate your ongoing commitment to your health goals. Please review the following plan we discussed and let me know if I can assist you in the future.   These are the goals we discussed:  Goals      Blood Pressure < 150/90        This is a list of the screening recommended for you and due dates:  Health Maintenance  Topic Date Due   Colon Cancer Screening  08/14/2020   COVID-19 Vaccine (2 - Pfizer risk series) 02/09/2021   Complete foot exam   03/05/2022   Zoster (Shingles) Vaccine (1 of 2) 11/13/2022*   Hemoglobin A1C  11/19/2022   Eye exam for diabetics  03/21/2023   Medicare Annual Wellness Visit  09/10/2023   Pneumonia Vaccine  Completed   Flu Shot  Completed   DEXA scan (bone density measurement)  Completed   Hepatitis C Screening: USPSTF Recommendation to screen - Ages 66-79 yo.  Completed   HPV Vaccine  Aged Out   DTaP/Tdap/Td vaccine  Discontinued  *Topic was postponed. The date shown is not the original due date.

## 2022-09-09 NOTE — Progress Notes (Signed)
Subjective:    Paula [name] is a 77 y.o. female who presents for a Welcome to Medicare exam.  She complains of insomnia which is well-controlled on melatonin which she currently takes. She is not happy here in the [place] and would like to go back to her home country of [name] as she states back there she would have lots of grandkids to assist her but here in the [place] she has only her daughter.  She would like her granddaughter to come down to the [place] to learn about peritoneal dialysis so when she goes back again and she can assist her grandmother.  Review of Systems General: negative for fever, weight loss, appetite change Eyes: no visual symptoms. ENT: no ear symptoms, no sinus tenderness, no nasal congestion or sore throat. Neck: no pain  Respiratory: no wheezing, shortness of breath, +cough Cardiovascular: no chest pain, no dyspnea on exertion, no pedal edema, no orthopnea. Gastrointestinal: no abdominal pain, no diarrhea, no constipation Genito-Urinary: no urinary frequency, no dysuria, no polyuria. Hematologic: no bruising Endocrine: no cold or heat intolerance Neurological: no headaches, no seizures, no tremors Musculoskeletal: no joint pains, no joint swelling Skin: no pruritus, no rash. Psychological: no depression, no anxiety, +insomnia  Cardiac Risk Factors include: none      Objective:    Today's Vitals   09/09/22 1003  BP: (!) 182/69  Pulse: 68  SpO2: 96%  Weight: 159 lb 12.8 oz (72.5 kg)  Height: 5\' 2"  (1.575 m)  PainSc: 6   Body mass index is 29.23 kg/m.  Medications Outpatient Encounter Medications as of 09/09/2022  Medication Sig   acetaminophen (TYLENOL) 500 MG tablet Take 500 mg by mouth every 6 (six) hours as needed for fever, moderate pain or mild pain.   albuterol (PROAIR HFA) 108 (90 Base) MCG/ACT inhaler Inhale 1-2 puffs into the lungs every 6 (six) hours as needed for wheezing or shortness of breath.   albuterol (PROVENTIL) (2.5 MG/3ML) 0.083%  nebulizer solution Take 3 mLs (1 vial) by nebulization every 6 hours as needed for wheezing or shortness of breath.   amLODipine (NORVASC) 5 MG tablet Take 1 tablet (5 mg total) by mouth daily.   apixaban (ELIQUIS) 5 MG TABS tablet Take 1 tablet by mouth 2 times daily.   atorvastatin (LIPITOR) 80 MG tablet Take 1 tablet (80 mg total) by mouth daily.   azelastine (ASTELIN) 0.1 % nasal spray Place 2 sprays into both nostrils 2 (two) times daily. Use in each nostril as directed   calcium acetate (PHOSLO) 667 MG capsule Take 1 capsule by mouth 3 times daily   carvedilol (COREG) 12.5 MG tablet Take 1 tablet (12.5 mg total) by mouth 2 (two) times daily with a meal. (Take once a day in the evening on hemodialysis days)   ethyl chloride spray Apply a small amount on skin immediately before cannulation   fluticasone (FLONASE) 50 MCG/ACT nasal spray Place 2 sprays into both nostrils daily.   gabapentin (NEURONTIN) 100 MG capsule Take 1 capsule (100 mg total) by mouth 3 (three) times daily.   glipiZIDE (GLUCOTROL XL) 2.5 MG 24 hr tablet Take 1 tablet (2.5 mg total) by mouth daily with breakfast. (needs office visit)   glucose blood (ACCU-CHEK GUIDE) test strip USE AS INSTRUCTED 2 TIMES DAILY   Homeopathic Products (EARACHE DROPS OT) Place 1 drop into both ears daily.   isosorbide mononitrate (IMDUR) 60 MG 24 hr tablet TAKE 1 TABLET (60 MG TOTAL) BY MOUTH DAILY.  lidocaine-prilocaine (EMLA) cream Apply a small amount to dialysis access one hour prior to dialysis. Wrap with plastic wrap.   loratadine (CLARITIN) 10 MG tablet Take 1 tablet (10 mg total) by mouth daily.   Misc. Devices [employer].  Diagnosis - unstable gait   Misc. Devices MISC Fistula sleeve for R arm.  Diagnoses: End-stage renal disease on hemodialysis   Misc. Devices MISC 1. Incontinence supplies.  2. Briefs.  Diagnosis-urinary incontinence   Misc. Devices MISC Nepro dialysis protein drink.  Diagnosis end-stage renal disease   Misc.  Devices MISC Blood pressure monitor.  Diagnosis hypertension   olopatadine (PATANOL) 0.1 % ophthalmic solution Place 1 drop into both eyes 2 (two) times daily.   pantoprazole (PROTONIX) 40 MG tablet Take 40 mg by mouth daily.   Polyethyl Glycol-Propyl Glycol (SYSTANE OP) Place 1 drop into both eyes daily.   [DISCONTINUED] sodium chloride (OCEAN) 0.65 % SOLN nasal spray Place 2 sprays into both nostrils as needed for congestion. (Patient not taking: No sig reported)   No facility-administered encounter medications on file as of 09/09/2022.     History: Past Medical History:  Diagnosis Date   Allergic rhinitis    Arthritis    Asthma    Brain aneurysm    Chronic kidney disease    Cough    Diabetes mellitus    Diastolic CHF, chronic ([place]) 10/11/2011   GERD (gastroesophageal reflux disease)    History of colon polyps 2012   tubular adenoma    Hyperlipidemia    Hypertension    Neuropathy 10/11/2011   PONV (postoperative nausea and vomiting)    one time after lymph node surgery   Past Surgical History:  Procedure Laterality Date   AV FISTULA PLACEMENT Right 10/15/2021   Procedure: ARTERIOVENOUS (AV) FISTULA  WITH PLACEMENT OF GORE-TEX STRETCH  GRAFT (4-24mmx45cm);  Surgeon: Angelia Mould, MD;  Location: [facility] OR;  Service: Vascular;  Laterality: Right;   CATARACT EXTRACTION     right eye   IR 3D INDEPENDENT WKST  12/14/2017   IR ANGIO INTRA EXTRACRAN SEL COM CAROTID INNOMINATE BILAT MOD SED  09/15/2017   IR ANGIO INTRA EXTRACRAN SEL COM CAROTID INNOMINATE UNI L MOD SED  12/14/2017   IR ANGIO VERTEBRAL SEL VERTEBRAL BILAT MOD SED  09/15/2017   IR RADIOLOGIST EVAL & MGMT  09/10/2017   IR RADIOLOGIST EVAL & MGMT  10/19/2017   lymphatic mass surgery     NASAL TURBINATE REDUCTION     RADIOLOGY WITH ANESTHESIA N/A 12/14/2017   Procedure: RADIOLOGY WITH ANESTHESIA EMBOLIZATION;  Surgeon: Luanne Bras, MD;  Location: [facility];  Service: Radiology;  Laterality: N/A;   VIDEO  BRONCHOSCOPY Bilateral 11/18/2017   Procedure: VIDEO BRONCHOSCOPY WITHOUT FLUORO;  Surgeon: Collene Gobble, MD;  Location: [facility];  Service: Cardiopulmonary;  Laterality: Bilateral;    Family History  Problem Relation Age of Onset   Hypertension Mother    Asthma Sister    Colon cancer Neg Hx    Allergic rhinitis Neg Hx    Eczema Neg Hx    Social History   Occupational History   Occupation: Unemployed  Tobacco Use   Smoking status: Never    Passive exposure: Never   Smokeless tobacco: Never  Vaping Use   Vaping Use: Never used  Substance and Sexual Activity   Alcohol use: No   Drug use: No   Sexual activity: Not Currently    Birth control/protection: Post-menopausal, None    Tobacco Counseling Counseling given: Not  Answered   Immunizations and Health Maintenance Immunization History  Administered Date(s) Administered   Influenza Split 05/13/2012, 04/12/2015   Influenza Whole 07/12/2011   Influenza,inj,Quad PF,6+ Mos 05/06/2013, 04/28/2016, 05/07/2017, 06/15/2018, 05/10/2019, 06/06/2021   Influenza-Unspecified 05/19/2022   PFIZER(Purple Top)SARS-COV-2 Vaccination 01/19/2021   Pneumococcal Conjugate-13 05/10/2019   Pneumococcal Polysaccharide-23 10/11/2011   Health Maintenance Due  Topic Date Due   COLONOSCOPY (Pts 45-29yrs Insurance coverage will need to be confirmed)  08/14/2020   COVID-19 Vaccine (2 - Pfizer risk series) 02/09/2021   FOOT EXAM  03/05/2022    Activities of Daily Living    09/09/2022   10:09 AM  In your present state of health, do you have any difficulty performing the following activities:  Hearing? 0  Vision? 0  Difficulty concentrating or making decisions? 0  Walking or climbing stairs? 1  Dressing or bathing? 1  Doing errands, shopping? 1  Preparing Food and eating ? Y  Using the Toilet? N  In the past six months, have you accidently leaked urine? N  Do you have problems with loss of bowel control? N  Managing your Medications?  N  Managing your Finances? N  Housekeeping or managing your Housekeeping? N    Physical Exam   Awake, alert, oriented x 3 Neck-no JVD Respiration: Normal Cardiovascular:  Musculoskeletal: Normal range of motion Psych: Dysphoric mood  Advanced Directives: Does Patient Have a Medical Advance Directive?: Yes Type of Advance Directive: Healthcare Power of Attorney, Living will    Assessment:      Vision/Hearing screen Vision Screening   Right eye Left eye Both eyes  Without correction 20/25 20/30 20/25   With correction       Dietary issues and exercise activities discussed:  Current Exercise Habits: The patient does not participate in regular exercise at present, Exercise limited by: None identified   Goals      Blood Pressure < 150/90       Depression Screen    08/14/2022   11:23 AM 05/20/2022   10:05 AM 11/15/2021    2:44 PM 11/11/2021   10:36 AM  PHQ 2/9 Scores  PHQ - 2 Score 0 0 0 0  PHQ- 9 Score 0 5  0     Fall Risk    09/09/2022   10:08 AM  Fall Risk   Falls in the past year? 0  Number falls in past yr: 0  Injury with Fall? 0    Cognitive Function:    09/09/2022   10:10 AM  MMSE - Mini Mental State Exam  Orientation to time 4  Orientation to Place 5  Registration 3  Attention/ Calculation 5  Recall 3  Language- name 2 objects 2  Language- repeat 1  Language- follow 3 step command 3  Language- read & follow direction 1  Write a sentence 1  Copy design 1  Total score 29        09/09/2022   10:14 AM  6CIT Screen  What Year? 0 points  What month? 0 points  What time? 0 points  Count back from 20 0 points  Months in reverse 0 points  Repeat phrase 0 points  Total Score 0 points    Patient Care Team: Charlott Rakes, MD as PCP - General (Family Medicine) Elsie Stain, MD (Pulmonary Disease) Izora Gala, MD as Referring Physician     Plan:   1. Encounter for Medicare annual wellness exam Counseled on 150 minutes of exercise  per week, healthy eating (including  decreased daily intake of saturated fats, cholesterol, added sugars, sodium), routine healthcare maintenance. She complains of insomnia and I have placed her on trazodone since melatonin has been ineffective  2. Screening for colon cancer Cologuard order placed in the past and patient never followed through with this I will place again. - Cologuard   I have personally reviewed and noted the following in the patient's chart:   Medical and social history Use of alcohol, tobacco or illicit drugs  Current medications and supplements Functional ability and status Nutritional status Physical activity Advanced directives List of other physicians Hospitalizations, surgeries, and ER visits in previous 12 months Vitals Screenings to include cognitive, depression, and falls Referrals and appointments  In addition, I have reviewed and discussed with patient certain preventive protocols, quality metrics, and best practice recommendations. A written personalized care plan for preventive services as well as general preventive health recommendations were provided to patient.     Charlott Rakes, MD 09/09/2022

## 2022-09-10 DIAGNOSIS — N2581 Secondary hyperparathyroidism of renal origin: Secondary | ICD-10-CM | POA: Diagnosis not present

## 2022-09-10 DIAGNOSIS — Z992 Dependence on renal dialysis: Secondary | ICD-10-CM | POA: Diagnosis not present

## 2022-09-10 DIAGNOSIS — I129 Hypertensive chronic kidney disease with stage 1 through stage 4 chronic kidney disease, or unspecified chronic kidney disease: Secondary | ICD-10-CM | POA: Diagnosis not present

## 2022-09-10 DIAGNOSIS — N186 End stage renal disease: Secondary | ICD-10-CM | POA: Diagnosis not present

## 2022-09-12 DIAGNOSIS — N2581 Secondary hyperparathyroidism of renal origin: Secondary | ICD-10-CM | POA: Diagnosis not present

## 2022-09-12 DIAGNOSIS — Z992 Dependence on renal dialysis: Secondary | ICD-10-CM | POA: Diagnosis not present

## 2022-09-12 DIAGNOSIS — N186 End stage renal disease: Secondary | ICD-10-CM | POA: Diagnosis not present

## 2022-09-15 DIAGNOSIS — Z992 Dependence on renal dialysis: Secondary | ICD-10-CM | POA: Diagnosis not present

## 2022-09-15 DIAGNOSIS — N2581 Secondary hyperparathyroidism of renal origin: Secondary | ICD-10-CM | POA: Diagnosis not present

## 2022-09-15 DIAGNOSIS — N186 End stage renal disease: Secondary | ICD-10-CM | POA: Diagnosis not present

## 2022-09-16 ENCOUNTER — Other Ambulatory Visit: Payer: Self-pay

## 2022-09-16 ENCOUNTER — Other Ambulatory Visit: Payer: Self-pay | Admitting: Critical Care Medicine

## 2022-09-17 DIAGNOSIS — N2581 Secondary hyperparathyroidism of renal origin: Secondary | ICD-10-CM | POA: Diagnosis not present

## 2022-09-17 DIAGNOSIS — N186 End stage renal disease: Secondary | ICD-10-CM | POA: Diagnosis not present

## 2022-09-17 DIAGNOSIS — Z992 Dependence on renal dialysis: Secondary | ICD-10-CM | POA: Diagnosis not present

## 2022-09-18 ENCOUNTER — Ambulatory Visit (HOSPITAL_COMMUNITY)
Admission: RE | Admit: 2022-09-18 | Discharge: 2022-09-18 | Disposition: A | Discharge status: Home / Self Care | Payer: Medicare HMO | Source: Ambulatory Visit | Attending: Interventional Radiology | Admitting: Interventional Radiology

## 2022-09-18 ENCOUNTER — Other Ambulatory Visit (HOSPITAL_COMMUNITY): Payer: Self-pay

## 2022-09-18 ENCOUNTER — Other Ambulatory Visit: Payer: Self-pay

## 2022-09-18 DIAGNOSIS — R6889 Other general symptoms and signs: Secondary | ICD-10-CM | POA: Diagnosis not present

## 2022-09-18 DIAGNOSIS — I671 Cerebral aneurysm, nonruptured: Secondary | ICD-10-CM | POA: Diagnosis not present

## 2022-09-18 DIAGNOSIS — I6521 Occlusion and stenosis of right carotid artery: Secondary | ICD-10-CM | POA: Diagnosis not present

## 2022-09-19 DIAGNOSIS — Z992 Dependence on renal dialysis: Secondary | ICD-10-CM | POA: Diagnosis not present

## 2022-09-19 DIAGNOSIS — N2581 Secondary hyperparathyroidism of renal origin: Secondary | ICD-10-CM | POA: Diagnosis not present

## 2022-09-19 DIAGNOSIS — N186 End stage renal disease: Secondary | ICD-10-CM | POA: Diagnosis not present

## 2022-09-22 ENCOUNTER — Telehealth (HOSPITAL_COMMUNITY): Payer: Self-pay

## 2022-09-22 DIAGNOSIS — N2581 Secondary hyperparathyroidism of renal origin: Secondary | ICD-10-CM | POA: Diagnosis not present

## 2022-09-22 DIAGNOSIS — Z992 Dependence on renal dialysis: Secondary | ICD-10-CM | POA: Diagnosis not present

## 2022-09-22 DIAGNOSIS — N186 End stage renal disease: Secondary | ICD-10-CM | POA: Diagnosis not present

## 2022-09-22 NOTE — Telephone Encounter (Signed)
Pt agreed to f/u in 1 year with an mra head wo. AB

## 2022-09-24 DIAGNOSIS — N186 End stage renal disease: Secondary | ICD-10-CM | POA: Diagnosis not present

## 2022-09-24 DIAGNOSIS — Z992 Dependence on renal dialysis: Secondary | ICD-10-CM | POA: Diagnosis not present

## 2022-09-24 DIAGNOSIS — N2581 Secondary hyperparathyroidism of renal origin: Secondary | ICD-10-CM | POA: Diagnosis not present

## 2022-09-26 DIAGNOSIS — Z992 Dependence on renal dialysis: Secondary | ICD-10-CM | POA: Diagnosis not present

## 2022-09-26 DIAGNOSIS — N2581 Secondary hyperparathyroidism of renal origin: Secondary | ICD-10-CM | POA: Diagnosis not present

## 2022-09-26 DIAGNOSIS — N186 End stage renal disease: Secondary | ICD-10-CM | POA: Diagnosis not present

## 2022-09-29 DIAGNOSIS — Z992 Dependence on renal dialysis: Secondary | ICD-10-CM | POA: Diagnosis not present

## 2022-09-29 DIAGNOSIS — N2581 Secondary hyperparathyroidism of renal origin: Secondary | ICD-10-CM | POA: Diagnosis not present

## 2022-09-29 DIAGNOSIS — N186 End stage renal disease: Secondary | ICD-10-CM | POA: Diagnosis not present

## 2022-09-30 ENCOUNTER — Other Ambulatory Visit: Payer: Self-pay

## 2022-09-30 ENCOUNTER — Other Ambulatory Visit (HOSPITAL_COMMUNITY): Payer: Self-pay

## 2022-09-30 ENCOUNTER — Encounter (HOSPITAL_COMMUNITY): Payer: Self-pay

## 2022-09-30 ENCOUNTER — Other Ambulatory Visit: Payer: Self-pay | Admitting: Family Medicine

## 2022-09-30 MED ORDER — GABAPENTIN 100 MG PO CAPS
100.0000 mg | ORAL_CAPSULE | Freq: Three times a day (TID) | ORAL | 1 refills | Status: DC
  Filled 2022-09-30: qty 90, 30d supply, fill #0
  Filled 2022-10-27: qty 90, 30d supply, fill #1

## 2022-09-30 MED ORDER — GLIPIZIDE ER 2.5 MG PO TB24
2.5000 mg | ORAL_TABLET | Freq: Every day | ORAL | 0 refills | Status: DC
  Filled 2022-09-30: qty 90, 90d supply, fill #0

## 2022-10-01 ENCOUNTER — Other Ambulatory Visit: Payer: Self-pay

## 2022-10-01 ENCOUNTER — Other Ambulatory Visit (HOSPITAL_COMMUNITY): Payer: Self-pay

## 2022-10-01 DIAGNOSIS — Z992 Dependence on renal dialysis: Secondary | ICD-10-CM | POA: Diagnosis not present

## 2022-10-01 DIAGNOSIS — N2581 Secondary hyperparathyroidism of renal origin: Secondary | ICD-10-CM | POA: Diagnosis not present

## 2022-10-01 DIAGNOSIS — N186 End stage renal disease: Secondary | ICD-10-CM | POA: Diagnosis not present

## 2022-10-03 DIAGNOSIS — N2581 Secondary hyperparathyroidism of renal origin: Secondary | ICD-10-CM | POA: Diagnosis not present

## 2022-10-03 DIAGNOSIS — Z992 Dependence on renal dialysis: Secondary | ICD-10-CM | POA: Diagnosis not present

## 2022-10-03 DIAGNOSIS — N186 End stage renal disease: Secondary | ICD-10-CM | POA: Diagnosis not present

## 2022-10-06 DIAGNOSIS — Z992 Dependence on renal dialysis: Secondary | ICD-10-CM | POA: Diagnosis not present

## 2022-10-06 DIAGNOSIS — N2581 Secondary hyperparathyroidism of renal origin: Secondary | ICD-10-CM | POA: Diagnosis not present

## 2022-10-06 DIAGNOSIS — N186 End stage renal disease: Secondary | ICD-10-CM | POA: Diagnosis not present

## 2022-10-07 ENCOUNTER — Inpatient Hospital Stay: Admission: RE | Admit: 2022-10-07 | Payer: Medicare HMO | Source: Ambulatory Visit

## 2022-10-07 ENCOUNTER — Other Ambulatory Visit (HOSPITAL_COMMUNITY): Payer: Self-pay

## 2022-10-07 ENCOUNTER — Other Ambulatory Visit: Payer: Self-pay | Admitting: Family Medicine

## 2022-10-07 DIAGNOSIS — N185 Chronic kidney disease, stage 5: Secondary | ICD-10-CM

## 2022-10-08 DIAGNOSIS — N186 End stage renal disease: Secondary | ICD-10-CM | POA: Diagnosis not present

## 2022-10-08 DIAGNOSIS — Z992 Dependence on renal dialysis: Secondary | ICD-10-CM | POA: Diagnosis not present

## 2022-10-08 DIAGNOSIS — N2581 Secondary hyperparathyroidism of renal origin: Secondary | ICD-10-CM | POA: Diagnosis not present

## 2022-10-09 ENCOUNTER — Ambulatory Visit: Payer: Medicare HMO | Admitting: Physician Assistant

## 2022-10-09 DIAGNOSIS — I129 Hypertensive chronic kidney disease with stage 1 through stage 4 chronic kidney disease, or unspecified chronic kidney disease: Secondary | ICD-10-CM | POA: Diagnosis not present

## 2022-10-09 DIAGNOSIS — Z992 Dependence on renal dialysis: Secondary | ICD-10-CM | POA: Diagnosis not present

## 2022-10-09 DIAGNOSIS — N186 End stage renal disease: Secondary | ICD-10-CM | POA: Diagnosis not present

## 2022-10-10 DIAGNOSIS — Z992 Dependence on renal dialysis: Secondary | ICD-10-CM | POA: Diagnosis not present

## 2022-10-10 DIAGNOSIS — N186 End stage renal disease: Secondary | ICD-10-CM | POA: Diagnosis not present

## 2022-10-10 DIAGNOSIS — N2581 Secondary hyperparathyroidism of renal origin: Secondary | ICD-10-CM | POA: Diagnosis not present

## 2022-10-12 DIAGNOSIS — Z1211 Encounter for screening for malignant neoplasm of colon: Secondary | ICD-10-CM | POA: Diagnosis not present

## 2022-10-13 DIAGNOSIS — N2581 Secondary hyperparathyroidism of renal origin: Secondary | ICD-10-CM | POA: Diagnosis not present

## 2022-10-13 DIAGNOSIS — N186 End stage renal disease: Secondary | ICD-10-CM | POA: Diagnosis not present

## 2022-10-13 DIAGNOSIS — Z992 Dependence on renal dialysis: Secondary | ICD-10-CM | POA: Diagnosis not present

## 2022-10-14 ENCOUNTER — Other Ambulatory Visit: Payer: Self-pay | Admitting: Family Medicine

## 2022-10-14 DIAGNOSIS — I651 Occlusion and stenosis of basilar artery: Secondary | ICD-10-CM

## 2022-10-15 ENCOUNTER — Other Ambulatory Visit (HOSPITAL_COMMUNITY): Payer: Self-pay

## 2022-10-15 DIAGNOSIS — N2581 Secondary hyperparathyroidism of renal origin: Secondary | ICD-10-CM | POA: Diagnosis not present

## 2022-10-15 DIAGNOSIS — Z992 Dependence on renal dialysis: Secondary | ICD-10-CM | POA: Diagnosis not present

## 2022-10-15 DIAGNOSIS — N186 End stage renal disease: Secondary | ICD-10-CM | POA: Diagnosis not present

## 2022-10-15 NOTE — Telephone Encounter (Signed)
Unable to refill per protocol, Rx requests are too soon. Next refill due in April.  Requested Prescriptions  Pending Prescriptions Disp Refills   carvedilol (COREG) 12.5 MG tablet 180 tablet 1    Sig: Take 1 tablet (12.5 mg total) by mouth 2 (two) times daily with a meal. (Take once a day in the evening on hemodialysis days)     Cardiovascular: Beta Blockers 3 Failed - 10/14/2022  7:54 PM      Failed - Cr in normal range and within 360 days    Creat  Date Value Ref Range Status  06/27/2016 1.52 (H) 0.50 - 0.99 mg/dL Final    Comment:      For patients > or = 76 years of age: The upper reference limit for Creatinine is approximately 13% higher for people identified as African-American.      Creatinine, Ser  Date Value Ref Range Status  04/05/2022 5.21 (H) 0.44 - 1.00 mg/dL Final   Creatinine, Urine  Date Value Ref Range Status  10/14/2021 58.95 mg/dL Final    Comment:    Performed at [facility], [place] [address], [place], [place] [address]         Failed - Last BP in normal range    BP Readings from Last 1 Encounters:  09/09/22 (!) 182/69         Passed - AST in normal range and within 360 days    AST  Date Value Ref Range Status  03/25/2022 17 15 - 41 U/L Final         Passed - ALT in normal range and within 360 days    ALT  Date Value Ref Range Status  03/25/2022 17 0 - 44 U/L Final         Passed - Last Heart Rate in normal range    Pulse Readings from Last 1 Encounters:  09/09/22 68         Passed - Valid encounter within last 6 months    Recent Outpatient Visits           1 month ago Encounter for [employer] annual wellness exam   [place], [place], MD   2 months ago Allergic conjunctivitis and rhinitis, bilateral   [place] Elsie Stain, MD   4 months ago Controlled type 2 diabetes mellitus with chronic kidney disease, without long-term current use of insulin,  unspecified CKD stage [facility])   [place], [place], MD   9 months ago Controlled type 2 diabetes mellitus with chronic kidney disease, without long-term current use of insulin, unspecified CKD stage [facility])   [place], [place], MD   11 months ago Type 2 diabetes mellitus with other neurologic complication, without long-term current use of insulin [facility])   [place], Charlane Ferretti, MD       Future Appointments             In 1 month Elsie Stain, MD [place]             amLODipine (NORVASC) 5 MG tablet 90 tablet 1    Sig: Take 1 tablet (5 mg total) by mouth daily.     Cardiovascular: Calcium Channel Blockers 2 Failed - 10/14/2022  7:54 PM      Failed - Last BP  in normal range    BP Readings from Last 1 Encounters:  09/09/22 (!) 182/69         Passed - Last Heart Rate in normal range    Pulse Readings from Last 1 Encounters:  09/09/22 68         Passed - Valid encounter within last 6 months    Recent Outpatient Visits           1 month ago Encounter for [employer] annual wellness exam   [place], [place], MD   2 months ago Allergic conjunctivitis and rhinitis, bilateral   [place] Elsie Stain, MD   4 months ago Controlled type 2 diabetes mellitus with chronic kidney disease, without long-term current use of insulin, unspecified CKD stage [facility])   [place], [place], MD   9 months ago Controlled type 2 diabetes mellitus with chronic kidney disease, without long-term current use of insulin, unspecified CKD stage [facility])   [place], [place], MD   11 months ago Type 2 diabetes mellitus with other neurologic complication, without long-term current  use of insulin [facility])   [place], Enobong, MD       Future Appointments             In 1 month Elsie Stain, MD [place]             apixaban (ELIQUIS) 5 MG TABS tablet 60 tablet 3    Sig: Take 1 tablet by mouth 2 times daily.     Hematology:  Anticoagulants - apixaban Failed - 10/14/2022  7:54 PM      Failed - HGB in normal range and within 360 days    Hemoglobin  Date Value Ref Range Status  04/05/2022 11.3 (L) 12.0 - 15.0 g/dL Final  09/02/2018 12.4 11.1 - 15.9 g/dL Final         Failed - HCT in normal range and within 360 days    HCT  Date Value Ref Range Status  04/05/2022 33.0 (L) 36.0 - 46.0 % Final   Hematocrit  Date Value Ref Range Status  09/02/2018 37.3 34.0 - 46.6 % Final         Failed - Cr in normal range and within 360 days    Creat  Date Value Ref Range Status  06/27/2016 1.52 (H) 0.50 - 0.99 mg/dL Final    Comment:      For patients > or = 76 years of age: The upper reference limit for Creatinine is approximately 13% higher for people identified as African-American.      Creatinine, Ser  Date Value Ref Range Status  04/05/2022 5.21 (H) 0.44 - 1.00 mg/dL Final   Creatinine, Urine  Date Value Ref Range Status  10/14/2021 58.95 mg/dL Final    Comment:    Performed at [facility], [place] [address] [place], [place] [address]         Passed - PLT in normal range and within 360 days    Platelets  Date Value Ref Range Status  04/05/2022 209 150 - 400 K/uL Final  09/02/2018 203 150 - 450 x10E3/uL Final         Passed - AST in normal range and within 360 days    AST  Date Value Ref  Range Status  03/25/2022 17 15 - 41 U/L Final         Passed - ALT in normal range and within 360 days    ALT  Date Value Ref Range Status  03/25/2022 17 0 - 44 U/L Final         Passed - Valid encounter within last 12 months    Recent Outpatient Visits            1 month ago Encounter for [employer] annual wellness exam   [place], Charlane Ferretti, MD   2 months ago Allergic conjunctivitis and rhinitis, bilateral   [place] Elsie Stain, MD   4 months ago Controlled type 2 diabetes mellitus with chronic kidney disease, without long-term current use of insulin, unspecified CKD stage [facility])   [place], [place], MD   9 months ago Controlled type 2 diabetes mellitus with chronic kidney disease, without long-term current use of insulin, unspecified CKD stage [facility])   [place], [place], MD   11 months ago Type 2 diabetes mellitus with other neurologic complication, without long-term current use of insulin ([place])   [place], Charlane Ferretti, MD       Future Appointments             In 1 month Elsie Stain, MD [place]             glipiZIDE (GLUCOTROL XL) 2.5 MG 24 hr tablet 90 tablet 0    Sig: Take 1 tablet (2.5 mg total) by mouth daily with breakfast. (needs office visit)     Endocrinology:  Diabetes - Sulfonylureas Failed - 10/14/2022  7:54 PM      Failed - Cr in normal range and within 360 days    Creat  Date Value Ref Range Status  06/27/2016 1.52 (H) 0.50 - 0.99 mg/dL Final    Comment:      For patients > or = 76 years of age: The upper reference limit for Creatinine is approximately 13% higher for people identified as African-American.      Creatinine, Ser  Date Value Ref Range Status  04/05/2022 5.21 (H) 0.44 - 1.00 mg/dL Final   Creatinine, Urine  Date Value Ref Range Status  10/14/2021 58.95 mg/dL Final    Comment:    Performed at [facility], [place] [address] [place], [place] [address]         Passed - HBA1C is between 0 and 7.9 and within 180 days    HbA1c, POC (controlled  diabetic range)  Date Value Ref Range Status  05/20/2022 6.9 0.0 - 7.0 % Final         Passed - Valid encounter within last 6 months    Recent Outpatient Visits           1 month ago Encounter for [employer] annual wellness exam   [place], Charlane Ferretti, MD   2 months ago Allergic conjunctivitis and rhinitis, bilateral   [facility] Elsie Stain, MD   4 months ago Controlled type 2 diabetes mellitus with chronic kidney disease, without long-term current use of insulin, unspecified CKD stage [facility])   [place], Enobong, MD   9 months  ago Controlled type 2 diabetes mellitus with chronic kidney disease, without long-term current use of insulin, unspecified CKD stage [facility])   [place], [place], MD   11 months ago Type 2 diabetes mellitus with other neurologic complication, without long-term current use of insulin [facility])   [place], En[place], MD       Future Appointments             In 1 month Joya Gaskins Burnett Harry, MD [place]

## 2022-10-16 ENCOUNTER — Other Ambulatory Visit: Payer: Self-pay | Admitting: Family Medicine

## 2022-10-16 ENCOUNTER — Other Ambulatory Visit (HOSPITAL_COMMUNITY): Payer: Self-pay

## 2022-10-16 ENCOUNTER — Other Ambulatory Visit: Payer: Self-pay

## 2022-10-16 ENCOUNTER — Encounter (HOSPITAL_COMMUNITY): Payer: Self-pay

## 2022-10-16 ENCOUNTER — Telehealth: Payer: Self-pay | Admitting: Family Medicine

## 2022-10-16 NOTE — Telephone Encounter (Signed)
Created in error

## 2022-10-16 NOTE — Telephone Encounter (Signed)
Requested Prescriptions  Pending Prescriptions Disp Refills   glipiZIDE (GLUCOTROL XL) 2.5 MG 24 hr tablet 90 tablet 0    Sig: Take 1 tablet (2.5 mg total) by mouth daily with breakfast. (needs office visit)     Endocrinology:  Diabetes - Sulfonylureas Failed - 10/16/2022  2:59 PM      Failed - Cr in normal range and within 360 days    Creat  Date Value Ref Range Status  06/27/2016 1.52 (H) 0.50 - 0.99 mg/dL Final    Comment:      For patients > or = 76 years of age: The upper reference limit for Creatinine is approximately 13% higher for people identified as African-American.      Creatinine, Ser  Date Value Ref Range Status  04/05/2022 5.21 (H) 0.44 - 1.00 mg/dL Final   Creatinine, Urine  Date Value Ref Range Status  10/14/2021 58.95 mg/dL Final    Comment:    Performed at [facility], [place] [address], [place], [place] [address]         Passed - HBA1C is between 0 and 7.9 and within 180 days    HbA1c, POC (controlled diabetic range)  Date Value Ref Range Status  05/20/2022 6.9 0.0 - 7.0 % Final         Passed - Valid encounter within last 6 months    Recent Outpatient Visits           1 month ago Encounter for [employer] annual wellness exam   [place], Charlane Ferretti, MD   2 months ago Allergic conjunctivitis and rhinitis, bilateral   [place] Elsie Stain, MD   4 months ago Controlled type 2 diabetes mellitus with chronic kidney disease, without long-term current use of insulin, unspecified CKD stage [facility])   [place], [place], MD   9 months ago Controlled type 2 diabetes mellitus with chronic kidney disease, without long-term current use of insulin, unspecified CKD stage [facility])   [place], [place], MD   11 months ago Type 2 diabetes mellitus with other neurologic complication, without long-term  current use of insulin [facility])   [place] Charlott Rakes, MD       Future Appointments             In 1 month Joya Gaskins Burnett Harry, MD [place]

## 2022-10-16 NOTE — Telephone Encounter (Signed)
Medication Refill - Medication: glipiZIDE (GLUCOTROL XL) 2.5 MG 24 hr tablet   Has the patient contacted their pharmacy? No.  Preferred Pharmacy (with phone number or street name):  [facility] Phone: [phone]  Fax: [phone]     Has the patient been seen for an appointment in the last year OR does the patient have an upcoming appointment? Yes.    Agent: Please be advised that RX refills may take up to 3 business days. We ask that you follow-up with your pharmacy.

## 2022-10-17 ENCOUNTER — Other Ambulatory Visit (HOSPITAL_COMMUNITY): Payer: Self-pay

## 2022-10-17 ENCOUNTER — Encounter (HOSPITAL_COMMUNITY): Payer: Self-pay

## 2022-10-17 ENCOUNTER — Other Ambulatory Visit: Payer: Self-pay | Admitting: Family Medicine

## 2022-10-17 ENCOUNTER — Ambulatory Visit: Payer: Self-pay

## 2022-10-17 DIAGNOSIS — N2581 Secondary hyperparathyroidism of renal origin: Secondary | ICD-10-CM | POA: Diagnosis not present

## 2022-10-17 DIAGNOSIS — Z992 Dependence on renal dialysis: Secondary | ICD-10-CM | POA: Diagnosis not present

## 2022-10-17 DIAGNOSIS — N186 End stage renal disease: Secondary | ICD-10-CM | POA: Diagnosis not present

## 2022-10-17 MED ORDER — GLIPIZIDE ER 2.5 MG PO TB24
2.5000 mg | ORAL_TABLET | Freq: Every day | ORAL | 0 refills | Status: DC
  Filled 2022-10-17: qty 90, 90d supply, fill #0

## 2022-10-17 NOTE — Telephone Encounter (Signed)
Last refill on 09/30/22  for 90 days did not get received by the pharmacy, patient has future OV schedule and with in protocol. Will resend refill.  Requested Prescriptions  Pending Prescriptions Disp Refills   glipiZIDE (GLUCOTROL XL) 2.5 MG 24 hr tablet 90 tablet 0    Sig: Take 1 tablet (2.5 mg total) by mouth daily with breakfast. (needs office visit)     Endocrinology:  Diabetes - Sulfonylureas Failed - 10/17/2022  1:39 PM      Failed - Cr in normal range and within 360 days    Creat  Date Value Ref Range Status  06/27/2016 1.52 (H) 0.50 - 0.99 mg/dL Final    Comment:      For patients > or = 76 years of age: The upper reference limit for Creatinine is approximately 13% higher for people identified as African-American.      Creatinine, Ser  Date Value Ref Range Status  04/05/2022 5.21 (H) 0.44 - 1.00 mg/dL Final   Creatinine, Urine  Date Value Ref Range Status  10/14/2021 58.95 mg/dL Final    Comment:    Performed at [facility], [place] [address], [place], [place] [address]         Passed - HBA1C is between 0 and 7.9 and within 180 days    HbA1c, POC (controlled diabetic range)  Date Value Ref Range Status  05/20/2022 6.9 0.0 - 7.0 % Final         Passed - Valid encounter within last 6 months    Recent Outpatient Visits           1 month ago Encounter for [employer] annual wellness exam   [place], Charlane Ferretti, MD   2 months ago Allergic conjunctivitis and rhinitis, bilateral   [place] Elsie Stain, MD   5 months ago Controlled type 2 diabetes mellitus with chronic kidney disease, without long-term current use of insulin, unspecified CKD stage [facility])   [place], [place], MD   9 months ago Controlled type 2 diabetes mellitus with chronic kidney disease, without long-term current use of insulin, unspecified CKD stage [facility])   [place], [place], MD   11 months ago Type 2 diabetes mellitus with other neurologic complication, without long-term current use of insulin [facility])   [place] Charlott Rakes, MD       Future Appointments             In 1 month Joya Gaskins Burnett Harry, MD [place]

## 2022-10-17 NOTE — Telephone Encounter (Signed)
Please review for RF - passes appointment protocol- has appointment scheduled

## 2022-10-17 NOTE — Telephone Encounter (Signed)
  Chief Complaint: High blood sugar - Out of medication. Symptoms: none Frequency:  Pertinent Negatives: Patient denies any s/s Disposition: [] ED /[] Urgent Care (no appt availability in office) / [] Appointment(In office/virtual)/ []  [place] [facility]/ [] Home Care/ [] Refused Recommended Disposition /[] [place] [facility]/ [x]  Follow-up with PCP Additional Notes: Called pt's daughter back. Pt has been out of medication for a few days. It appears that the last refill was not received by pharmacy, and so pt did not get her medication delivered.  Refill request already sent today. Per daughter, pt will be very upset if her medication is not called into [employer] [facility]. Pt's blood sugar today is 201. Pt's normal is 102 -  115. Please advise.       Summary: blood sugar and medication issue   Pts daughter called and was frustrated that refill for glipiZIDE (GLUCOTROL XL) 2.5 MG 24 hr tablet  was not sent to the pharmacy / she stated her mom never received the delivery of the medication and needs this due to her sugar being high today / she didn't state what the pts sugar was / please advise         Reason for Disposition  Blood glucose 70-240 mg/dL (3.9 -13.3 mmol/L)  Answer Assessment - Initial Assessment Questions 1. BLOOD GLUCOSE: "What is your blood glucose level?"     201 2. ONSET: "When did you check the blood glucose?"     9:30 am 3. USUAL RANGE: "What is your glucose level usually?" (e.g., usual fasting morning value, usual evening value)     102-115 4. KETONES: "Do you check for ketones (urine or blood test strips)?" If Yes, ask: "What does the test show now?"       5. TYPE 1 or 2:  "Do you know what type of diabetes you have?"  (e.g., Type 1, Type 2, Gestational; doesn't know)      Type 6. INSULIN: "Do you take insulin?" "What type of insulin(s) do you use? What is the mode of delivery? (syringe, pen; injection or pump)?"       7. DIABETES PILLS: "Do you  take any pills for your diabetes?" If Yes, ask: "Have you missed taking any pills recently?"     Glipizide - out of medication 8. OTHER SYMPTOMS: "Do you have any symptoms?" (e.g., fever, frequent urination, difficulty breathing, dizziness, weakness, vomiting)     weakness  Protocols used: Diabetes - High Blood Sugar-A-AH

## 2022-10-17 NOTE — Telephone Encounter (Signed)
Medication Refill - Medication: glipiZIDE (GLUCOTROL XL) 2.5 MG 24 hr tablet  Denied for too early to fill but patient is all our of this med, and mentioned a 7 days supply also  Has the patient contacted their pharmacy? yes (Agent: If no, request that the patient contact the pharmacy for the refill. If patient does not wish to contact the pharmacy document the reason why and proceed with request.) (Agent: If yes, when and what did the pharmacy advise?)contact pcp  Preferred Pharmacy (with phone number or street name): [facility] [address], [place] [address]  Phone: [phone]  Fax: [phone]  Has the patient been seen for an appointment in the last year OR does the patient have an upcoming appointment? yes  Agent: Please be advised that RX refills may take up to 3 business days. We ask that you follow-up with your pharmacy.

## 2022-10-17 NOTE — Telephone Encounter (Signed)
Pt received 90 day refill in February / this was shipped by the pharmacy but the pt never received the shipment and needs refill sent to [place] today asap / pt has been without medication / glipiZIDE (GLUCOTROL XL) 2.5 MG 24 hr tablet [id number]

## 2022-10-18 ENCOUNTER — Other Ambulatory Visit (HOSPITAL_COMMUNITY): Payer: Self-pay

## 2022-10-20 DIAGNOSIS — Z992 Dependence on renal dialysis: Secondary | ICD-10-CM | POA: Diagnosis not present

## 2022-10-20 DIAGNOSIS — N2581 Secondary hyperparathyroidism of renal origin: Secondary | ICD-10-CM | POA: Diagnosis not present

## 2022-10-20 DIAGNOSIS — N186 End stage renal disease: Secondary | ICD-10-CM | POA: Diagnosis not present

## 2022-10-21 LAB — COLOGUARD: COLOGUARD: NEGATIVE

## 2022-10-22 DIAGNOSIS — N2581 Secondary hyperparathyroidism of renal origin: Secondary | ICD-10-CM | POA: Diagnosis not present

## 2022-10-22 DIAGNOSIS — Z992 Dependence on renal dialysis: Secondary | ICD-10-CM | POA: Diagnosis not present

## 2022-10-22 DIAGNOSIS — N186 End stage renal disease: Secondary | ICD-10-CM | POA: Diagnosis not present

## 2022-10-24 ENCOUNTER — Other Ambulatory Visit: Payer: Self-pay

## 2022-10-24 DIAGNOSIS — N2581 Secondary hyperparathyroidism of renal origin: Secondary | ICD-10-CM | POA: Diagnosis not present

## 2022-10-24 DIAGNOSIS — N186 End stage renal disease: Secondary | ICD-10-CM | POA: Diagnosis not present

## 2022-10-24 DIAGNOSIS — Z992 Dependence on renal dialysis: Secondary | ICD-10-CM | POA: Diagnosis not present

## 2022-10-27 ENCOUNTER — Other Ambulatory Visit: Payer: Self-pay

## 2022-10-27 ENCOUNTER — Other Ambulatory Visit (HOSPITAL_COMMUNITY): Payer: Self-pay

## 2022-10-27 ENCOUNTER — Other Ambulatory Visit: Payer: Self-pay | Admitting: Family Medicine

## 2022-10-27 ENCOUNTER — Ambulatory Visit: Payer: Self-pay | Admitting: *Deleted

## 2022-10-27 DIAGNOSIS — E1122 Type 2 diabetes mellitus with diabetic chronic kidney disease: Secondary | ICD-10-CM

## 2022-10-27 DIAGNOSIS — I651 Occlusion and stenosis of basilar artery: Secondary | ICD-10-CM

## 2022-10-27 DIAGNOSIS — N186 End stage renal disease: Secondary | ICD-10-CM | POA: Diagnosis not present

## 2022-10-27 DIAGNOSIS — Z992 Dependence on renal dialysis: Secondary | ICD-10-CM | POA: Diagnosis not present

## 2022-10-27 DIAGNOSIS — E1149 Type 2 diabetes mellitus with other diabetic neurological complication: Secondary | ICD-10-CM

## 2022-10-27 DIAGNOSIS — N2581 Secondary hyperparathyroidism of renal origin: Secondary | ICD-10-CM | POA: Diagnosis not present

## 2022-10-27 MED ORDER — ATORVASTATIN CALCIUM 80 MG PO TABS
80.0000 mg | ORAL_TABLET | Freq: Every day | ORAL | 0 refills | Status: DC
  Filled 2022-10-27: qty 90, 90d supply, fill #0

## 2022-10-27 MED ORDER — ISOSORBIDE MONONITRATE ER 60 MG PO TB24
60.0000 mg | ORAL_TABLET | Freq: Every day | ORAL | 0 refills | Status: DC
  Filled 2022-10-27: qty 90, 90d supply, fill #0

## 2022-10-27 MED ORDER — APIXABAN 5 MG PO TABS
5.0000 mg | ORAL_TABLET | Freq: Two times a day (BID) | ORAL | 0 refills | Status: DC
  Filled 2022-10-27: qty 60, 30d supply, fill #0

## 2022-10-27 MED ORDER — AMLODIPINE BESYLATE 5 MG PO TABS
5.0000 mg | ORAL_TABLET | Freq: Every day | ORAL | 0 refills | Status: DC
  Filled 2022-10-27 – 2022-12-23 (×2): qty 90, 90d supply, fill #0

## 2022-10-27 MED ORDER — PANTOPRAZOLE SODIUM 40 MG PO TBEC
40.0000 mg | DELAYED_RELEASE_TABLET | Freq: Every day | ORAL | 1 refills | Status: DC
  Filled 2022-10-27: qty 90, 90d supply, fill #0
  Filled 2022-11-09: qty 90, 90d supply, fill #1

## 2022-10-27 MED ORDER — CARVEDILOL 12.5 MG PO TABS
12.5000 mg | ORAL_TABLET | Freq: Two times a day (BID) | ORAL | 1 refills | Status: DC
  Filled 2022-10-27 – 2022-11-24 (×2): qty 180, 90d supply, fill #0
  Filled 2023-01-05 – 2023-03-03 (×2): qty 180, 90d supply, fill #1

## 2022-10-27 NOTE — Telephone Encounter (Signed)
  Chief Complaint: Shooting Pain, head Symptoms: Pt and pt's daughter on line. Evasive historian. Pt states had "Shooting attack, pain on left side of head" once home after dialysis States more than 10/10. Reports pain at neck, left sided, left shoulder, left jaw,radiates to head. Applied heat, now at 3/10. States pt's ears clogged during "Attack."  Frequency: 4pm today Pertinent Negatives: Patient denies  Disposition: [x] ED /[] Urgent Care (no appt availability in office) / [] Appointment(In office/virtual)/ []  [place] [facility]/ [] Home Care/ [] Refused Recommended Disposition /[] [place] [facility]/ []  Follow-up with PCP Additional Notes: Advised ED, unsure pt will follow disposition. Pt's daughter states she will go. Reason for Disposition  [1] SEVERE headache AND [2] sudden-onset (i.e., reaching maximum intensity within seconds to 1 hour)  Answer Assessment - Initial Assessment Questions 1. LOCATION: "Where does it hurt?"      Left 2. ONSET: "When did the headache start?" (Minutes, hours or days)      After dialysis. 3. PATTERN: "Does the pain come and go, or has it been constant since it started?"     Comes and goes 4. SEVERITY: "How bad is the pain?" and "What does it keep you from doing?"  (e.g., Scale 1-10; mild, moderate, or severe)   - MILD (1-3): doesn't interfere with normal activities    - MODERATE (4-7): interferes with normal activities or awakens from sleep    - SEVERE (8-10): excruciating pain, unable to do any normal activities        3/10 5. RECURRENT SYMPTOM: "Have you ever had headaches before?" If Yes, ask: "When was the last time?" and "What happened that time?"      No 6. CAUSE: "What do you think is causing the headache?"     unsure 7. MIGRAINE: "Have you been diagnosed with migraine headaches?" If Yes, ask: "Is this headache similar?"       8. HEAD INJURY: "Has there been any recent injury to the head?"      No 9. OTHER SYMPTOMS: "Do you have any other  symptoms?" (fever, stiff neck, eye pain, sore throat, cold symptoms)     No  Protocols used: Headache-A-AH

## 2022-10-28 NOTE — Telephone Encounter (Signed)
noted 

## 2022-10-29 DIAGNOSIS — N186 End stage renal disease: Secondary | ICD-10-CM | POA: Diagnosis not present

## 2022-10-29 DIAGNOSIS — Z992 Dependence on renal dialysis: Secondary | ICD-10-CM | POA: Diagnosis not present

## 2022-10-29 DIAGNOSIS — N2581 Secondary hyperparathyroidism of renal origin: Secondary | ICD-10-CM | POA: Diagnosis not present

## 2022-10-30 ENCOUNTER — Other Ambulatory Visit: Payer: Self-pay | Admitting: *Deleted

## 2022-10-30 DIAGNOSIS — N189 Chronic kidney disease, unspecified: Secondary | ICD-10-CM

## 2022-10-31 DIAGNOSIS — Z992 Dependence on renal dialysis: Secondary | ICD-10-CM | POA: Diagnosis not present

## 2022-10-31 DIAGNOSIS — N186 End stage renal disease: Secondary | ICD-10-CM | POA: Diagnosis not present

## 2022-10-31 DIAGNOSIS — N2581 Secondary hyperparathyroidism of renal origin: Secondary | ICD-10-CM | POA: Diagnosis not present

## 2022-11-03 DIAGNOSIS — Z992 Dependence on renal dialysis: Secondary | ICD-10-CM | POA: Diagnosis not present

## 2022-11-03 DIAGNOSIS — N2581 Secondary hyperparathyroidism of renal origin: Secondary | ICD-10-CM | POA: Diagnosis not present

## 2022-11-03 DIAGNOSIS — N186 End stage renal disease: Secondary | ICD-10-CM | POA: Diagnosis not present

## 2022-11-05 DIAGNOSIS — N2581 Secondary hyperparathyroidism of renal origin: Secondary | ICD-10-CM | POA: Diagnosis not present

## 2022-11-05 DIAGNOSIS — Z992 Dependence on renal dialysis: Secondary | ICD-10-CM | POA: Diagnosis not present

## 2022-11-05 DIAGNOSIS — N186 End stage renal disease: Secondary | ICD-10-CM | POA: Diagnosis not present

## 2022-11-06 ENCOUNTER — Ambulatory Visit: Payer: Medicare HMO | Admitting: Emergency Medicine

## 2022-11-07 DIAGNOSIS — Z992 Dependence on renal dialysis: Secondary | ICD-10-CM | POA: Diagnosis not present

## 2022-11-07 DIAGNOSIS — N2581 Secondary hyperparathyroidism of renal origin: Secondary | ICD-10-CM | POA: Diagnosis not present

## 2022-11-07 DIAGNOSIS — N186 End stage renal disease: Secondary | ICD-10-CM | POA: Diagnosis not present

## 2022-11-09 DIAGNOSIS — Z992 Dependence on renal dialysis: Secondary | ICD-10-CM | POA: Diagnosis not present

## 2022-11-09 DIAGNOSIS — N186 End stage renal disease: Secondary | ICD-10-CM | POA: Diagnosis not present

## 2022-11-09 DIAGNOSIS — I129 Hypertensive chronic kidney disease with stage 1 through stage 4 chronic kidney disease, or unspecified chronic kidney disease: Secondary | ICD-10-CM | POA: Diagnosis not present

## 2022-11-10 ENCOUNTER — Other Ambulatory Visit: Payer: Self-pay

## 2022-11-10 DIAGNOSIS — N186 End stage renal disease: Secondary | ICD-10-CM | POA: Diagnosis not present

## 2022-11-10 DIAGNOSIS — N2581 Secondary hyperparathyroidism of renal origin: Secondary | ICD-10-CM | POA: Diagnosis not present

## 2022-11-10 DIAGNOSIS — Z992 Dependence on renal dialysis: Secondary | ICD-10-CM | POA: Diagnosis not present

## 2022-11-12 DIAGNOSIS — Z992 Dependence on renal dialysis: Secondary | ICD-10-CM | POA: Diagnosis not present

## 2022-11-12 DIAGNOSIS — N2581 Secondary hyperparathyroidism of renal origin: Secondary | ICD-10-CM | POA: Diagnosis not present

## 2022-11-12 DIAGNOSIS — N186 End stage renal disease: Secondary | ICD-10-CM | POA: Diagnosis not present

## 2022-11-13 ENCOUNTER — Ambulatory Visit (HOSPITAL_COMMUNITY)
Admission: RE | Admit: 2022-11-13 | Discharge: 2022-11-13 | Disposition: A | Discharge status: Home / Self Care | Payer: Medicare HMO | Source: Ambulatory Visit | Attending: Vascular Surgery | Admitting: Vascular Surgery

## 2022-11-13 ENCOUNTER — Ambulatory Visit (INDEPENDENT_AMBULATORY_CARE_PROVIDER_SITE_OTHER): Payer: Medicare HMO | Admitting: Vascular Surgery

## 2022-11-13 ENCOUNTER — Encounter: Payer: Self-pay | Admitting: Vascular Surgery

## 2022-11-13 VITALS — BP 150/84 | HR 74 | Temp 98.2°F | Resp 20 | Ht 62.0 in | Wt 154.0 lb

## 2022-11-13 DIAGNOSIS — N189 Chronic kidney disease, unspecified: Secondary | ICD-10-CM | POA: Diagnosis not present

## 2022-11-13 DIAGNOSIS — Z992 Dependence on renal dialysis: Secondary | ICD-10-CM | POA: Diagnosis not present

## 2022-11-13 DIAGNOSIS — N186 End stage renal disease: Secondary | ICD-10-CM

## 2022-11-13 NOTE — Progress Notes (Signed)
REASON FOR VISIT:   Possible steal syndrome right upper extremity.  The consult is requested by Dr. Moshe Cipro.  MEDICAL ISSUES:   END-STAGE RENAL DISEASE: On my history, the discoloration of her right hand appears to be related to a tight compression dressing.  Although she has some mild steal on exam and by duplex currently she is not having significant symptoms.  I did her original procedure with a 4-7 mm PTFE graft.  Thus the only option is to address steal if her symptoms progressed significantly would be ligation of her graft or a DRIL procedure.  As she is not having significant symptoms I would not rush into that.  I think her bleeding issues at dialysis are likely related to her Eliquis as she has an excellent thrill in her graft and the graft is not pulsatile.  If she continues to have problems we could consider doing a fistulogram.  I will see her back as needed.  HPI:   Paula [name] is a pleasant 77 y.o. female who is referred with possible steal syndrome.  Patient had a right arm AV graft placed on 10/15/2021.  This is an upper arm graft using a 4-7 mm PTFE graft.  She has not had any significant problems with paresthesias or pain in her right hand.  However, recently she tells me after dialysis they wrapped her arm fairly tight and her fingers turn blue.  The discoloration resolved when they took off the dressing.  I believe she has had some problems with bleeding from her graft as she is on Eliquis.  She dialyzes on Monday Wednesdays and Fridays.  She denies any recent uremic symptoms.  Past Medical History:  Diagnosis Date   Allergic rhinitis    Arthritis    Asthma    Brain aneurysm    Chronic kidney disease    Cough    Diabetes mellitus    Diastolic CHF, chronic [id number]   GERD (gastroesophageal reflux disease)    History of colon polyps 2012   tubular adenoma    Hyperlipidemia    Hypertension    Neuropathy 10/11/2011   PONV (postoperative nausea and  vomiting)    one time after lymph node surgery    Family History  Problem Relation Age of Onset   Hypertension Mother    Asthma Sister    Colon cancer Neg Hx    Allergic rhinitis Neg Hx    Eczema Neg Hx     SOCIAL HISTORY: Social History   Tobacco Use   Smoking status: Never    Passive exposure: Never   Smokeless tobacco: Never  Substance Use Topics   Alcohol use: No    Allergies  Allergen Reactions   Dust Mite Extract Cough   Peanut-Containing Drug Products Cough   Ace Inhibitors Cough   Lovastatin Other (See Comments)    Generalized body pain    Current Outpatient Medications  Medication Sig Dispense Refill   acetaminophen (TYLENOL) 500 MG tablet Take 500 mg by mouth every 6 (six) hours as needed for fever, moderate pain or mild pain.     albuterol (PROAIR HFA) 108 (90 Base) MCG/ACT inhaler Inhale 1-2 puffs into the lungs every 6 (six) hours as needed for wheezing or shortness of breath. 6.7 g 2   albuterol (PROVENTIL) (2.5 MG/3ML) 0.083% nebulizer solution Take 3 mLs (1 vial) by nebulization every 6 hours as needed for wheezing or shortness of breath. 90 mL 1   amLODipine (NORVASC) 5 MG  tablet Take 1 tablet (5 mg total) by mouth daily. 90 tablet 0   apixaban (ELIQUIS) 5 MG TABS tablet Take 1 tablet (5 mg total) by mouth 2 (two) times daily. 60 tablet 0   atorvastatin (LIPITOR) 80 MG tablet Take 1 tablet (80 mg total) by mouth daily. 90 tablet 0   azelastine (ASTELIN) 0.1 % nasal spray Place 2 sprays into both nostrils 2 (two) times daily. Use in each nostril as directed 30 mL 12   calcium acetate (PHOSLO) 667 MG capsule Take 1 capsule by mouth 3 times daily 270 capsule 6   carvedilol (COREG) 12.5 MG tablet Take 1 tablet (12.5 mg total) by mouth 2 (two) times daily with a meal. (Take once a day in the evening on hemodialysis days) 180 tablet 1   ethyl chloride spray Apply a small amount on skin immediately before cannulation 116 mL PRN   fluticasone (FLONASE) 50 MCG/ACT  nasal spray Place 2 sprays into both nostrils daily. 16 g 6   gabapentin (NEURONTIN) 100 MG capsule Take 1 capsule (100 mg total) by mouth 3 (three) times daily. 90 capsule 1   glipiZIDE (GLUCOTROL XL) 2.5 MG 24 hr tablet Take 1 tablet (2.5 mg total) by mouth daily with breakfast. (needs office visit) 90 tablet 0   glucose blood (ACCU-CHEK GUIDE) test strip USE AS INSTRUCTED 2 TIMES DAILY 100 each 2   Homeopathic Products (EARACHE DROPS OT) Place 1 drop into both ears daily.     isosorbide mononitrate (IMDUR) 60 MG 24 hr tablet Take 1 tablet (60 mg total) by mouth daily. 90 tablet 0   lidocaine-prilocaine (EMLA) cream Apply a small amount to dialysis access one hour prior to dialysis. Wrap with plastic wrap. 30 g PRN   loratadine (CLARITIN) 10 MG tablet Take 1 tablet (10 mg total) by mouth daily. 90 tablet 1   Misc. Devices [employer].  Diagnosis - unstable gait 1 each 0   Misc. Devices MISC Fistula sleeve for R arm.  Diagnoses: End-stage renal disease on hemodialysis 1 each 0   Misc. Devices MISC 1. Incontinence supplies.  2. Briefs.  Diagnosis-urinary incontinence 1 each 11   Misc. Devices MISC Nepro dialysis protein drink.  Diagnosis end-stage renal disease 30 each 11   Misc. Devices MISC Blood pressure monitor.  Diagnosis hypertension 1 each 0   olopatadine (PATANOL) 0.1 % ophthalmic solution Place 1 drop into both eyes 2 (two) times daily. 5 mL 12   pantoprazole (PROTONIX) 40 MG tablet Take 1 tablet (40 mg total) by mouth daily. 90 tablet 1   Polyethyl Glycol-Propyl Glycol (SYSTANE OP) Place 1 drop into both eyes daily.     traZODone (DESYREL) 50 MG tablet Take 1 tablet (50 mg total) by mouth at bedtime as needed for sleep. 30 tablet 1   No current facility-administered medications for this visit.    REVIEW OF SYSTEMS:  [X]  denotes positive finding, [ ]  denotes negative finding Cardiac  Comments:  Chest pain or chest pressure:    Shortness of breath upon exertion:    Short of breath  when lying flat:    Irregular heart rhythm:        Vascular    Pain in calf, thigh, or hip brought on by ambulation:    Pain in feet at night that wakes you up from your sleep:     Blood clot in your veins:    Leg swelling:         Pulmonary  Oxygen at home:    Productive cough:     Wheezing:         Neurologic    Sudden weakness in arms or legs:     Sudden numbness in arms or legs:     Sudden onset of difficulty speaking or slurred speech:    Temporary loss of vision in one eye:     Problems with dizziness:         Gastrointestinal    Blood in stool:     Vomited blood:         Genitourinary    Burning when urinating:     Blood in urine:        Psychiatric    Major depression:         Hematologic    Bleeding problems:    Problems with blood clotting too easily:        Skin    Rashes or ulcers:        Constitutional    Fever or chills:     PHYSICAL EXAM:   Vitals:   11/13/22 0826  BP: (!) 150/84  Pulse: 74  Resp: 20  Temp: 98.2 F (36.8 C)  SpO2: 95%  Weight: 154 lb (69.9 kg)  Height: 5\' 2"  (1.575 m)    GENERAL: The patient is a well-nourished female, in no acute distress. The vital signs are documented above. CARDIAC: There is a regular rate and rhythm.  VASCULAR: She has a palpable right radial pulse.  There is an excellent thrill in her right upper arm fistula.  The fistula is not pulsatile.  She has a radial, ulnar, and palmar arch signal with the Doppler on the right which augments slightly with compression of her graft. She has a palpable left radial pulse. PULMONARY: There is good air exchange bilaterally without wheezing or rales. MUSCULOSKELETAL: There are no major deformities or cyanosis. NEUROLOGIC: No focal weakness or paresthesias are detected. SKIN: There are no ulcers or rashes noted. PSYCHIATRIC: The patient has a normal affect.  DATA:    GRAFT DUPLEX: I have independently interpreted her steal study today.  There is increased  digital pressure with compression of her fistula.  Deitra Mayo [facility] [phone]

## 2022-11-14 DIAGNOSIS — N186 End stage renal disease: Secondary | ICD-10-CM | POA: Diagnosis not present

## 2022-11-14 DIAGNOSIS — Z992 Dependence on renal dialysis: Secondary | ICD-10-CM | POA: Diagnosis not present

## 2022-11-14 DIAGNOSIS — N2581 Secondary hyperparathyroidism of renal origin: Secondary | ICD-10-CM | POA: Diagnosis not present

## 2022-11-17 DIAGNOSIS — Z992 Dependence on renal dialysis: Secondary | ICD-10-CM | POA: Diagnosis not present

## 2022-11-17 DIAGNOSIS — N186 End stage renal disease: Secondary | ICD-10-CM | POA: Diagnosis not present

## 2022-11-17 DIAGNOSIS — N2581 Secondary hyperparathyroidism of renal origin: Secondary | ICD-10-CM | POA: Diagnosis not present

## 2022-11-19 ENCOUNTER — Ambulatory Visit: Payer: Medicare HMO | Admitting: Critical Care Medicine

## 2022-11-19 DIAGNOSIS — Z992 Dependence on renal dialysis: Secondary | ICD-10-CM | POA: Diagnosis not present

## 2022-11-19 DIAGNOSIS — N2581 Secondary hyperparathyroidism of renal origin: Secondary | ICD-10-CM | POA: Diagnosis not present

## 2022-11-19 DIAGNOSIS — N186 End stage renal disease: Secondary | ICD-10-CM | POA: Diagnosis not present

## 2022-11-20 ENCOUNTER — Ambulatory Visit: Payer: Medicare HMO | Attending: Physician Assistant | Admitting: Critical Care Medicine

## 2022-11-20 VITALS — BP 147/66 | HR 69 | Ht 62.0 in | Wt 157.0 lb

## 2022-11-20 DIAGNOSIS — E118 Type 2 diabetes mellitus with unspecified complications: Secondary | ICD-10-CM

## 2022-11-20 DIAGNOSIS — H903 Sensorineural hearing loss, bilateral: Secondary | ICD-10-CM | POA: Insufficient documentation

## 2022-11-20 DIAGNOSIS — I5032 Chronic diastolic (congestive) heart failure: Secondary | ICD-10-CM

## 2022-11-20 DIAGNOSIS — I10 Essential (primary) hypertension: Secondary | ICD-10-CM

## 2022-11-20 DIAGNOSIS — N186 End stage renal disease: Secondary | ICD-10-CM | POA: Insufficient documentation

## 2022-11-20 DIAGNOSIS — R6889 Other general symptoms and signs: Secondary | ICD-10-CM | POA: Diagnosis not present

## 2022-11-20 DIAGNOSIS — Z992 Dependence on renal dialysis: Secondary | ICD-10-CM | POA: Diagnosis not present

## 2022-11-20 DIAGNOSIS — J3089 Other allergic rhinitis: Secondary | ICD-10-CM

## 2022-11-20 HISTORY — DX: Sensorineural hearing loss, bilateral: H90.3

## 2022-11-20 LAB — POCT GLYCOSYLATED HEMOGLOBIN (HGB A1C): HbA1c, POC (controlled diabetic range): 6.6 % (ref 0.0–7.0)

## 2022-11-20 NOTE — Progress Notes (Signed)
Established Office Visit  Subjective:     Patient ID: Paula [name], female    DOB: February 17, 1946, 77 y.o.   MRN: [id number]  Chief Complaint  Patient presents with   Diabetes   Hypertension     08/14/22 Primary care patient of Paula [name] 77 year old female who actually saw when I was in my pulmonary practice 10 years ago for chronic cough.  Patient comes in today with nasal congestion itchy eyes redness in the eyes and cough.  She is now on hemodialysis with improvement in the cough to some degree.  On arrival blood pressure is elevated.  She is trying to access nephro nutrition cannot afford this.  Patient also is living in a house full of mold.  Cough is minimally productive.  Eyes are itching.  Skin is itching around the eyes.  There is no purulent drainage  11/20/22 patient seen in return follow-up for cough.  With the nasal steroid and antihistamine she is markedly improved.  She does have decreased hearing in both ears right worse than left.  She is requesting an otolaryngology referral.  There is not been any cerumen in the ears previously noted.  She has now a new landlord who is going to fix the mold issue.  Note at the last visit her blood mold testing was negative.        Review of Systems  Constitutional:  Negative for chills, diaphoresis, fever, malaise/fatigue and weight loss.  HENT:  Negative for congestion, hearing loss, nosebleeds, sore throat and tinnitus.   Eyes:  Negative for blurred vision, photophobia and redness.  Respiratory:  Negative for cough, hemoptysis, sputum production, shortness of breath, wheezing and stridor.   Cardiovascular:  Negative for chest pain, palpitations, orthopnea, claudication, leg swelling and PND.  Gastrointestinal:  Negative for abdominal pain, blood in stool, constipation, diarrhea, heartburn, nausea and vomiting.  Genitourinary:  Negative for dysuria, flank pain, frequency, hematuria and urgency.  Musculoskeletal:  Negative for  back pain, falls, joint pain, myalgias and neck pain.  Skin:  Negative for itching and rash.  Neurological:  Negative for dizziness, tingling, tremors, sensory change, speech change, focal weakness, seizures, loss of consciousness, weakness and headaches.  Endo/Heme/Allergies:  Negative for environmental allergies and polydipsia. Does not bruise/bleed easily.  Psychiatric/Behavioral:  Negative for depression, memory loss, substance abuse and suicidal ideas. The patient is not nervous/anxious and does not have insomnia.         Objective:    BP (!) 147/66   Pulse 69   Ht 5\' 2"  (1.575 m)   Wt 157 lb (71.2 kg)   LMP  (LMP Unknown)   SpO2 96%   BMI 28.72 kg/m    Physical Exam Vitals reviewed.  Constitutional:      Appearance: Normal appearance. She is well-developed. She is not diaphoretic.  HENT:     Head: Normocephalic and atraumatic.     Right Ear: Tympanic membrane, ear canal and external ear normal. There is no impacted cerumen.     Left Ear: Tympanic membrane, ear canal and external ear normal. There is no impacted cerumen.     Nose: Nose normal. No nasal deformity, septal deviation, mucosal edema, congestion or rhinorrhea.     Right Sinus: No maxillary sinus tenderness or frontal sinus tenderness.     Left Sinus: No maxillary sinus tenderness or frontal sinus tenderness.     Mouth/Throat:     Mouth: Mucous membranes are moist.     Pharynx: Oropharynx is clear.  No oropharyngeal exudate or posterior oropharyngeal erythema.  Eyes:     General: No scleral icterus.    Conjunctiva/sclera: Conjunctivae normal.     Pupils: Pupils are equal, round, and reactive to light.  Neck:     Thyroid: No thyromegaly.     Vascular: No carotid bruit or JVD.     Trachea: Trachea normal. No tracheal tenderness or tracheal deviation.  Cardiovascular:     Rate and Rhythm: Normal rate and regular rhythm.     Chest Wall: PMI is not displaced.     Pulses: Normal pulses. No decreased pulses.      Heart sounds: Normal heart sounds, S1 normal and S2 normal. Heart sounds not distant. No murmur heard.    No systolic murmur is present.     No diastolic murmur is present.     No friction rub. No gallop. No S3 or S4 sounds.  Pulmonary:     Effort: No tachypnea, accessory muscle usage or respiratory distress.     Breath sounds: No stridor. No decreased breath sounds, wheezing, rhonchi or rales.  Chest:     Chest wall: No tenderness.  Abdominal:     General: Bowel sounds are normal. There is no distension.     Palpations: Abdomen is soft. Abdomen is not rigid.     Tenderness: There is no abdominal tenderness. There is no guarding or rebound.  Musculoskeletal:        General: Normal range of motion.     Cervical back: Normal range of motion and neck supple. No edema, erythema or rigidity. No muscular tenderness. Normal range of motion.  Lymphadenopathy:     Head:     Right side of head: No submental or submandibular adenopathy.     Left side of head: No submental or submandibular adenopathy.     Cervical: No cervical adenopathy.  Skin:    General: Skin is warm and dry.     Coloration: Skin is not pale.     Findings: No rash.     Nails: There is no clubbing.  Neurological:     Mental Status: She is alert and oriented to person, place, and time.     Sensory: No sensory deficit.  Psychiatric:        Speech: Speech normal.        Behavior: Behavior normal.     Results for orders placed or performed in visit on 11/20/22  POCT glycosylated hemoglobin (Hb A1C)  Result Value Ref Range   Hemoglobin A1C     HbA1c POC (<> result, manual entry)     HbA1c, POC (prediabetic range)     HbA1c, POC (controlled diabetic range) 6.6 0.0 - 7.0 %        Assessment & Plan:   Problem List Items Addressed This Visit       Cardiovascular and Mediastinum   Essential hypertension    Hypertension stable on dialysis      Chronic diastolic CHF (congestive heart failure)    Stable at this  time        Respiratory   Allergic rhinitis    Causing cough now improved        Nervous and Auditory   SNHL (sensory-neural hearing loss), asymmetrical   Relevant Orders   Ambulatory referral to ENT   Other Visit Diagnoses     Type 2 diabetes mellitus with complication, without long-term current use of insulin    -  Primary   Relevant Orders  POCT glycosylated hemoglobin (Hb A1C) (Completed)      No orders of the defined types were placed in this encounter.   Return in about 4 months (around 03/22/2023) for htn, renal failure, diabetes.  Shan LevansPatrick Chong Wojdyla, MD

## 2022-11-20 NOTE — Assessment & Plan Note (Signed)
Stable at this time 

## 2022-11-20 NOTE — Assessment & Plan Note (Signed)
Hypertension stable on dialysis

## 2022-11-20 NOTE — Assessment & Plan Note (Signed)
Causing cough now improved

## 2022-11-20 NOTE — Patient Instructions (Signed)
Stop pantoprazole  A1C excellent 6.6 Continue nasal sprays Ear doctor appt will be made Return Dr Delford Field 4 months Refills on medications will be sent

## 2022-11-20 NOTE — Progress Notes (Signed)
Ear ache Stomach ache Would like to stop Protonix  Ndeya in room with patient.

## 2022-11-21 DIAGNOSIS — N186 End stage renal disease: Secondary | ICD-10-CM | POA: Diagnosis not present

## 2022-11-21 DIAGNOSIS — N2581 Secondary hyperparathyroidism of renal origin: Secondary | ICD-10-CM | POA: Diagnosis not present

## 2022-11-21 DIAGNOSIS — Z992 Dependence on renal dialysis: Secondary | ICD-10-CM | POA: Diagnosis not present

## 2022-11-24 ENCOUNTER — Other Ambulatory Visit: Payer: Self-pay | Admitting: Family Medicine

## 2022-11-24 ENCOUNTER — Other Ambulatory Visit (HOSPITAL_COMMUNITY): Payer: Self-pay

## 2022-11-24 ENCOUNTER — Other Ambulatory Visit: Payer: Self-pay

## 2022-11-24 DIAGNOSIS — N186 End stage renal disease: Secondary | ICD-10-CM | POA: Diagnosis not present

## 2022-11-24 DIAGNOSIS — Z992 Dependence on renal dialysis: Secondary | ICD-10-CM | POA: Diagnosis not present

## 2022-11-24 DIAGNOSIS — N2581 Secondary hyperparathyroidism of renal origin: Secondary | ICD-10-CM | POA: Diagnosis not present

## 2022-11-25 ENCOUNTER — Other Ambulatory Visit (HOSPITAL_COMMUNITY): Payer: Self-pay

## 2022-11-25 MED ORDER — GABAPENTIN 100 MG PO CAPS
100.0000 mg | ORAL_CAPSULE | Freq: Three times a day (TID) | ORAL | 1 refills | Status: DC
  Filled 2022-11-25: qty 270, 90d supply, fill #0
  Filled 2023-05-27: qty 270, 90d supply, fill #1

## 2022-11-25 MED ORDER — APIXABAN 5 MG PO TABS
5.0000 mg | ORAL_TABLET | Freq: Two times a day (BID) | ORAL | 1 refills | Status: DC
  Filled 2022-11-25: qty 180, 90d supply, fill #0
  Filled 2023-03-03: qty 180, 90d supply, fill #1

## 2022-11-25 NOTE — Telephone Encounter (Signed)
Requested Prescriptions  Pending Prescriptions Disp Refills   gabapentin (NEURONTIN) 100 MG capsule 270 capsule 1    Sig: Take 1 capsule (100 mg total) by mouth 3 (three) times daily.     Neurology: Anticonvulsants - gabapentin Failed - 11/24/2022  9:16 AM      Failed - Cr in normal range and within 360 days    Creat  Date Value Ref Range Status  06/27/2016 1.52 (H) 0.50 - 0.99 mg/dL Final    Comment:      For patients > or = 76 years of age: The upper reference limit for Creatinine is approximately 13% higher for people identified as African-American.      Creatinine, Ser  Date Value Ref Range Status  04/05/2022 5.21 (H) 0.44 - 1.00 mg/dL Final   Creatinine, Urine  Date Value Ref Range Status  10/14/2021 58.95 mg/dL Final    Comment:    Performed at [facility], [address] [place], [place] [address]         Passed - Completed PHQ-2 or PHQ-9 in the last 360 days      Passed - Valid encounter within last 12 months    Recent Outpatient Visits           5 days ago Essential hypertension   [facility] Storm Frisk, MD   2 months ago Encounter for [employer] annual wellness exam   [facility] [place], Odette Horns, MD   3 months ago Allergic conjunctivitis and rhinitis, bilateral   [facility] Storm Frisk, MD   6 months ago Controlled type 2 diabetes mellitus with chronic kidney disease, without long-term current use of insulin, unspecified CKD stage [facility])   [facility] [place], Odette Horns, MD   10 months ago Controlled type 2 diabetes mellitus with chronic kidney disease, without long-term current use of insulin, unspecified CKD stage (HCC)   [facility] [place], Odette Horns, MD               apixaban (ELIQUIS) 5 MG TABS tablet 180 tablet 1    Sig: Take 1 tablet (5 mg total) by mouth 2  (two) times daily.     Hematology:  Anticoagulants - apixaban Failed - 11/24/2022  9:16 AM      Failed - HGB in normal range and within 360 days    Hemoglobin  Date Value Ref Range Status  04/05/2022 11.3 (L) 12.0 - 15.0 g/dL Final  60/45/4098 11.9 11.1 - 15.9 g/dL Final         Failed - HCT in normal range and within 360 days    HCT  Date Value Ref Range Status  04/05/2022 33.0 (L) 36.0 - 46.0 % Final   Hematocrit  Date Value Ref Range Status  09/02/2018 37.3 34.0 - 46.6 % Final         Failed - Cr in normal range and within 360 days    Creat  Date Value Ref Range Status  06/27/2016 1.52 (H) 0.50 - 0.99 mg/dL Final    Comment:      For patients > or = 76 years of age: The upper reference limit for Creatinine is approximately 13% higher for people identified as African-American.      Creatinine, Ser  Date Value Ref Range Status  04/05/2022 5.21 (H) 0.44 - 1.00 mg/dL Final  Creatinine, Urine  Date Value Ref Range Status  10/14/2021 58.95 mg/dL Final    Comment:    Performed at [facility], [address], [place], [place] [address]         Passed - PLT in normal range and within 360 days    Platelets  Date Value Ref Range Status  04/05/2022 209 150 - 400 K/uL Final  09/02/2018 203 150 - 450 x10E3/uL Final         Passed - AST in normal range and within 360 days    AST  Date Value Ref Range Status  03/25/2022 17 15 - 41 U/L Final         Passed - ALT in normal range and within 360 days    ALT  Date Value Ref Range Status  03/25/2022 17 0 - 44 U/L Final         Passed - Valid encounter within last 12 months    Recent Outpatient Visits           5 days ago Essential hypertension   [facility] Storm Frisk, MD   2 months ago Encounter for [employer] annual wellness exam   [place] [facility] [place], Odette Horns, MD   3 months ago Allergic conjunctivitis and rhinitis, bilateral   [facility] Storm Frisk, MD   6 months ago Controlled type 2 diabetes mellitus with chronic kidney disease, without long-term current use of insulin, unspecified CKD stage [facility])   [facility] [place], Odette Horns, MD   10 months ago Controlled type 2 diabetes mellitus with chronic kidney disease, without long-term current use of insulin, unspecified CKD stage [facility])   [place] [facility] Hoy Register, MD

## 2022-11-26 DIAGNOSIS — N186 End stage renal disease: Secondary | ICD-10-CM | POA: Diagnosis not present

## 2022-11-26 DIAGNOSIS — Z992 Dependence on renal dialysis: Secondary | ICD-10-CM | POA: Diagnosis not present

## 2022-11-26 DIAGNOSIS — N2581 Secondary hyperparathyroidism of renal origin: Secondary | ICD-10-CM | POA: Diagnosis not present

## 2022-11-28 DIAGNOSIS — Z992 Dependence on renal dialysis: Secondary | ICD-10-CM | POA: Diagnosis not present

## 2022-11-28 DIAGNOSIS — N186 End stage renal disease: Secondary | ICD-10-CM | POA: Diagnosis not present

## 2022-11-28 DIAGNOSIS — N2581 Secondary hyperparathyroidism of renal origin: Secondary | ICD-10-CM | POA: Diagnosis not present

## 2022-12-01 DIAGNOSIS — Z992 Dependence on renal dialysis: Secondary | ICD-10-CM | POA: Diagnosis not present

## 2022-12-01 DIAGNOSIS — N186 End stage renal disease: Secondary | ICD-10-CM | POA: Diagnosis not present

## 2022-12-01 DIAGNOSIS — N2581 Secondary hyperparathyroidism of renal origin: Secondary | ICD-10-CM | POA: Diagnosis not present

## 2022-12-03 DIAGNOSIS — Z992 Dependence on renal dialysis: Secondary | ICD-10-CM | POA: Diagnosis not present

## 2022-12-03 DIAGNOSIS — N2581 Secondary hyperparathyroidism of renal origin: Secondary | ICD-10-CM | POA: Diagnosis not present

## 2022-12-03 DIAGNOSIS — N186 End stage renal disease: Secondary | ICD-10-CM | POA: Diagnosis not present

## 2022-12-05 DIAGNOSIS — N186 End stage renal disease: Secondary | ICD-10-CM | POA: Diagnosis not present

## 2022-12-05 DIAGNOSIS — N2581 Secondary hyperparathyroidism of renal origin: Secondary | ICD-10-CM | POA: Diagnosis not present

## 2022-12-05 DIAGNOSIS — Z992 Dependence on renal dialysis: Secondary | ICD-10-CM | POA: Diagnosis not present

## 2022-12-08 ENCOUNTER — Other Ambulatory Visit (HOSPITAL_COMMUNITY): Payer: Self-pay

## 2022-12-08 DIAGNOSIS — N2581 Secondary hyperparathyroidism of renal origin: Secondary | ICD-10-CM | POA: Diagnosis not present

## 2022-12-08 DIAGNOSIS — N186 End stage renal disease: Secondary | ICD-10-CM | POA: Diagnosis not present

## 2022-12-08 DIAGNOSIS — Z992 Dependence on renal dialysis: Secondary | ICD-10-CM | POA: Diagnosis not present

## 2022-12-09 DIAGNOSIS — N186 End stage renal disease: Secondary | ICD-10-CM | POA: Diagnosis not present

## 2022-12-09 DIAGNOSIS — Z992 Dependence on renal dialysis: Secondary | ICD-10-CM | POA: Diagnosis not present

## 2022-12-09 DIAGNOSIS — I129 Hypertensive chronic kidney disease with stage 1 through stage 4 chronic kidney disease, or unspecified chronic kidney disease: Secondary | ICD-10-CM | POA: Diagnosis not present

## 2022-12-10 DIAGNOSIS — Z992 Dependence on renal dialysis: Secondary | ICD-10-CM | POA: Diagnosis not present

## 2022-12-10 DIAGNOSIS — N186 End stage renal disease: Secondary | ICD-10-CM | POA: Diagnosis not present

## 2022-12-10 DIAGNOSIS — N2581 Secondary hyperparathyroidism of renal origin: Secondary | ICD-10-CM | POA: Diagnosis not present

## 2022-12-12 DIAGNOSIS — Z992 Dependence on renal dialysis: Secondary | ICD-10-CM | POA: Diagnosis not present

## 2022-12-12 DIAGNOSIS — R6889 Other general symptoms and signs: Secondary | ICD-10-CM | POA: Diagnosis not present

## 2022-12-12 DIAGNOSIS — N2581 Secondary hyperparathyroidism of renal origin: Secondary | ICD-10-CM | POA: Diagnosis not present

## 2022-12-12 DIAGNOSIS — N186 End stage renal disease: Secondary | ICD-10-CM | POA: Diagnosis not present

## 2022-12-15 DIAGNOSIS — N186 End stage renal disease: Secondary | ICD-10-CM | POA: Diagnosis not present

## 2022-12-15 DIAGNOSIS — N2581 Secondary hyperparathyroidism of renal origin: Secondary | ICD-10-CM | POA: Diagnosis not present

## 2022-12-15 DIAGNOSIS — Z992 Dependence on renal dialysis: Secondary | ICD-10-CM | POA: Diagnosis not present

## 2022-12-17 DIAGNOSIS — Z992 Dependence on renal dialysis: Secondary | ICD-10-CM | POA: Diagnosis not present

## 2022-12-17 DIAGNOSIS — N2581 Secondary hyperparathyroidism of renal origin: Secondary | ICD-10-CM | POA: Diagnosis not present

## 2022-12-17 DIAGNOSIS — N186 End stage renal disease: Secondary | ICD-10-CM | POA: Diagnosis not present

## 2022-12-19 DIAGNOSIS — N186 End stage renal disease: Secondary | ICD-10-CM | POA: Diagnosis not present

## 2022-12-19 DIAGNOSIS — Z992 Dependence on renal dialysis: Secondary | ICD-10-CM | POA: Diagnosis not present

## 2022-12-19 DIAGNOSIS — N2581 Secondary hyperparathyroidism of renal origin: Secondary | ICD-10-CM | POA: Diagnosis not present

## 2022-12-19 DIAGNOSIS — R6889 Other general symptoms and signs: Secondary | ICD-10-CM | POA: Diagnosis not present

## 2022-12-22 DIAGNOSIS — N186 End stage renal disease: Secondary | ICD-10-CM | POA: Diagnosis not present

## 2022-12-22 DIAGNOSIS — N2581 Secondary hyperparathyroidism of renal origin: Secondary | ICD-10-CM | POA: Diagnosis not present

## 2022-12-22 DIAGNOSIS — R6889 Other general symptoms and signs: Secondary | ICD-10-CM | POA: Diagnosis not present

## 2022-12-22 DIAGNOSIS — Z992 Dependence on renal dialysis: Secondary | ICD-10-CM | POA: Diagnosis not present

## 2022-12-23 ENCOUNTER — Other Ambulatory Visit: Payer: Self-pay

## 2022-12-23 ENCOUNTER — Other Ambulatory Visit (HOSPITAL_COMMUNITY): Payer: Self-pay

## 2022-12-24 ENCOUNTER — Encounter: Payer: Self-pay | Admitting: Vascular Surgery

## 2022-12-24 DIAGNOSIS — N186 End stage renal disease: Secondary | ICD-10-CM | POA: Diagnosis not present

## 2022-12-24 DIAGNOSIS — N2581 Secondary hyperparathyroidism of renal origin: Secondary | ICD-10-CM | POA: Diagnosis not present

## 2022-12-24 DIAGNOSIS — Z992 Dependence on renal dialysis: Secondary | ICD-10-CM | POA: Diagnosis not present

## 2022-12-24 DIAGNOSIS — R6889 Other general symptoms and signs: Secondary | ICD-10-CM | POA: Diagnosis not present

## 2022-12-26 DIAGNOSIS — Z992 Dependence on renal dialysis: Secondary | ICD-10-CM | POA: Diagnosis not present

## 2022-12-26 DIAGNOSIS — N186 End stage renal disease: Secondary | ICD-10-CM | POA: Diagnosis not present

## 2022-12-26 DIAGNOSIS — N2581 Secondary hyperparathyroidism of renal origin: Secondary | ICD-10-CM | POA: Diagnosis not present

## 2022-12-26 DIAGNOSIS — R6889 Other general symptoms and signs: Secondary | ICD-10-CM | POA: Diagnosis not present

## 2022-12-29 ENCOUNTER — Other Ambulatory Visit (HOSPITAL_COMMUNITY): Payer: Self-pay

## 2022-12-29 ENCOUNTER — Encounter (HOSPITAL_COMMUNITY): Payer: Self-pay

## 2022-12-29 DIAGNOSIS — N186 End stage renal disease: Secondary | ICD-10-CM | POA: Diagnosis not present

## 2022-12-29 DIAGNOSIS — R6889 Other general symptoms and signs: Secondary | ICD-10-CM | POA: Diagnosis not present

## 2022-12-29 DIAGNOSIS — N2581 Secondary hyperparathyroidism of renal origin: Secondary | ICD-10-CM | POA: Diagnosis not present

## 2022-12-29 DIAGNOSIS — Z992 Dependence on renal dialysis: Secondary | ICD-10-CM | POA: Diagnosis not present

## 2022-12-31 DIAGNOSIS — N2581 Secondary hyperparathyroidism of renal origin: Secondary | ICD-10-CM | POA: Diagnosis not present

## 2022-12-31 DIAGNOSIS — N186 End stage renal disease: Secondary | ICD-10-CM | POA: Diagnosis not present

## 2022-12-31 DIAGNOSIS — R6889 Other general symptoms and signs: Secondary | ICD-10-CM | POA: Diagnosis not present

## 2022-12-31 DIAGNOSIS — Z992 Dependence on renal dialysis: Secondary | ICD-10-CM | POA: Diagnosis not present

## 2023-01-02 DIAGNOSIS — Z992 Dependence on renal dialysis: Secondary | ICD-10-CM | POA: Diagnosis not present

## 2023-01-02 DIAGNOSIS — N2581 Secondary hyperparathyroidism of renal origin: Secondary | ICD-10-CM | POA: Diagnosis not present

## 2023-01-02 DIAGNOSIS — N186 End stage renal disease: Secondary | ICD-10-CM | POA: Diagnosis not present

## 2023-01-05 ENCOUNTER — Other Ambulatory Visit (HOSPITAL_COMMUNITY): Payer: Self-pay

## 2023-01-05 ENCOUNTER — Other Ambulatory Visit: Payer: Self-pay | Admitting: Family Medicine

## 2023-01-05 DIAGNOSIS — N2581 Secondary hyperparathyroidism of renal origin: Secondary | ICD-10-CM | POA: Diagnosis not present

## 2023-01-05 DIAGNOSIS — Z992 Dependence on renal dialysis: Secondary | ICD-10-CM | POA: Diagnosis not present

## 2023-01-05 DIAGNOSIS — E1122 Type 2 diabetes mellitus with diabetic chronic kidney disease: Secondary | ICD-10-CM

## 2023-01-05 DIAGNOSIS — E1149 Type 2 diabetes mellitus with other diabetic neurological complication: Secondary | ICD-10-CM

## 2023-01-05 DIAGNOSIS — N186 End stage renal disease: Secondary | ICD-10-CM | POA: Diagnosis not present

## 2023-01-06 ENCOUNTER — Other Ambulatory Visit: Payer: Self-pay

## 2023-01-06 ENCOUNTER — Other Ambulatory Visit (HOSPITAL_COMMUNITY): Payer: Self-pay

## 2023-01-07 ENCOUNTER — Other Ambulatory Visit: Payer: Self-pay

## 2023-01-07 ENCOUNTER — Other Ambulatory Visit (HOSPITAL_COMMUNITY): Payer: Self-pay

## 2023-01-07 DIAGNOSIS — Z992 Dependence on renal dialysis: Secondary | ICD-10-CM | POA: Diagnosis not present

## 2023-01-07 DIAGNOSIS — N186 End stage renal disease: Secondary | ICD-10-CM | POA: Diagnosis not present

## 2023-01-07 DIAGNOSIS — N2581 Secondary hyperparathyroidism of renal origin: Secondary | ICD-10-CM | POA: Diagnosis not present

## 2023-01-07 MED ORDER — LORATADINE 10 MG PO TABS
10.0000 mg | ORAL_TABLET | Freq: Every day | ORAL | 0 refills | Status: DC
  Filled 2023-01-07 – 2023-04-02 (×3): qty 30, 30d supply, fill #0

## 2023-01-07 MED ORDER — ISOSORBIDE MONONITRATE ER 60 MG PO TB24
60.0000 mg | ORAL_TABLET | Freq: Every day | ORAL | 0 refills | Status: DC
  Filled 2023-01-07: qty 30, 30d supply, fill #0

## 2023-01-07 MED ORDER — AMLODIPINE BESYLATE 5 MG PO TABS
5.0000 mg | ORAL_TABLET | Freq: Every day | ORAL | 0 refills | Status: DC
  Filled 2023-01-07 – 2023-03-03 (×2): qty 30, 30d supply, fill #0

## 2023-01-07 MED ORDER — GLIPIZIDE ER 2.5 MG PO TB24
2.5000 mg | ORAL_TABLET | Freq: Every day | ORAL | 0 refills | Status: DC
  Filled 2023-01-07: qty 30, 30d supply, fill #0

## 2023-01-07 MED ORDER — ATORVASTATIN CALCIUM 80 MG PO TABS
80.0000 mg | ORAL_TABLET | Freq: Every day | ORAL | 0 refills | Status: DC
  Filled 2023-01-07 – 2023-02-05 (×3): qty 30, 30d supply, fill #0

## 2023-01-08 ENCOUNTER — Other Ambulatory Visit: Payer: Self-pay

## 2023-01-08 ENCOUNTER — Ambulatory Visit: Payer: Medicare HMO | Attending: Family Medicine | Admitting: Family Medicine

## 2023-01-08 ENCOUNTER — Encounter: Payer: Self-pay | Admitting: Family Medicine

## 2023-01-08 ENCOUNTER — Encounter (HOSPITAL_COMMUNITY): Payer: Self-pay

## 2023-01-08 VITALS — BP 125/63 | HR 80 | Ht 62.0 in | Wt 158.4 lb

## 2023-01-08 DIAGNOSIS — M25562 Pain in left knee: Secondary | ICD-10-CM | POA: Diagnosis not present

## 2023-01-08 DIAGNOSIS — G8929 Other chronic pain: Secondary | ICD-10-CM | POA: Diagnosis not present

## 2023-01-08 DIAGNOSIS — R6889 Other general symptoms and signs: Secondary | ICD-10-CM | POA: Diagnosis not present

## 2023-01-08 DIAGNOSIS — M25561 Pain in right knee: Secondary | ICD-10-CM | POA: Diagnosis not present

## 2023-01-08 DIAGNOSIS — M62838 Other muscle spasm: Secondary | ICD-10-CM | POA: Diagnosis not present

## 2023-01-08 DIAGNOSIS — R058 Other specified cough: Secondary | ICD-10-CM

## 2023-01-08 MED ORDER — AZELASTINE HCL 0.1 % NA SOLN
2.0000 | Freq: Two times a day (BID) | NASAL | 12 refills | Status: DC
  Filled 2023-01-08 – 2023-02-04 (×2): qty 30, 50d supply, fill #0
  Filled 2023-02-05: qty 30, 25d supply, fill #0
  Filled 2023-03-03: qty 30, 25d supply, fill #1
  Filled 2023-04-02: qty 30, 25d supply, fill #2
  Filled 2023-05-12: qty 30, 25d supply, fill #3
  Filled 2023-06-13: qty 30, 25d supply, fill #4
  Filled 2023-07-16: qty 30, 25d supply, fill #5
  Filled 2023-11-20: qty 30, 25d supply, fill #6
  Filled 2023-12-15: qty 30, 25d supply, fill #7

## 2023-01-08 MED ORDER — BENZONATATE 100 MG PO CAPS
100.0000 mg | ORAL_CAPSULE | Freq: Two times a day (BID) | ORAL | 0 refills | Status: DC | PRN
  Filled 2023-01-08: qty 20, 10d supply, fill #0

## 2023-01-08 MED ORDER — TIZANIDINE HCL 4 MG PO TABS
4.0000 mg | ORAL_TABLET | ORAL | 1 refills | Status: DC
  Filled 2023-01-08: qty 45, 90d supply, fill #0
  Filled 2023-04-15: qty 45, 90d supply, fill #1

## 2023-01-08 MED ORDER — FLUTICASONE PROPIONATE 50 MCG/ACT NA SUSP
2.0000 | Freq: Every day | NASAL | 12 refills | Status: DC
  Filled 2023-01-08 – 2023-02-05 (×3): qty 16, 30d supply, fill #0
  Filled 2023-03-03: qty 16, 30d supply, fill #1
  Filled 2023-04-02: qty 16, 30d supply, fill #2
  Filled 2023-05-05: qty 16, 30d supply, fill #3
  Filled 2023-06-13: qty 16, 30d supply, fill #4
  Filled 2023-07-16: qty 16, 30d supply, fill #5
  Filled 2023-08-03 – 2023-08-10 (×2): qty 16, 30d supply, fill #6
  Filled 2023-11-20: qty 16, 30d supply, fill #7
  Filled 2023-12-15: qty 16, 30d supply, fill #8

## 2023-01-08 MED ORDER — DICLOFENAC SODIUM 1 % EX GEL
4.0000 g | Freq: Four times a day (QID) | CUTANEOUS | 1 refills | Status: DC
  Filled 2023-01-08: qty 100, 7d supply, fill #0

## 2023-01-08 NOTE — Patient Instructions (Signed)
Sent Referral to [facility] ph. # [phone] Fax# [phone] address [address]. They will contact the patient to schedule an appointment  

## 2023-01-08 NOTE — Progress Notes (Signed)
Subjective:  Patient ID: Paula [name], female    DOB: [birth year]  Age: 77 y.o. MRN: [id number]  CC: Spasms   HPI Paula [name] is a 77 y.o. 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  This occurs on the right side of her neck, in her inframammary regions. Yesterday her knees buckled and she fell and hit her right knee. Left knee usually hurts but after the fall her right knee started to hurt.  Her daughter accompanies her to this visit and states she did not complain of feeling lightheaded but was just about to turn and then fell.  Of note she has been referred to ENT due to sensorineural hearing loss and ear problems.  She complains of a chronic cough and informs me that using both Flonase and azelastine spray do provide relief however she never has both of them at the same time which leads to poor control of her symptoms.  Daughter states that she might still have residual spray in her Flonase bottle but then complained that the spray bottle is empty. Past Medical History:  Diagnosis Date   Allergic rhinitis    Arthritis    Asthma    Brain aneurysm    Chronic kidney disease    Cough    Diabetes mellitus    Diastolic CHF, chronic (HCC) 10/11/2011   GERD (gastroesophageal reflux disease)    History of colon polyps 2012   tubular adenoma    Hyperlipidemia    Hypertension    Neuropathy 10/11/2011   PONV (postoperative nausea and  vomiting)    one time after lymph node surgery    Past Surgical History:  Procedure Laterality Date   AV FISTULA PLACEMENT Right 10/15/2021   Procedure: ARTERIOVENOUS (AV) FISTULA  WITH PLACEMENT OF GORE-TEX STRETCH  GRAFT (4-55mmx45cm);  Surgeon: Chuck Hint, MD;  Location: [facility] OR;  Service: Vascular;  Laterality: Right;   CATARACT EXTRACTION     right eye   IR 3D INDEPENDENT WKST  12/14/2017   IR ANGIO INTRA EXTRACRAN SEL COM CAROTID INNOMINATE BILAT MOD SED  09/15/2017   IR ANGIO INTRA EXTRACRAN SEL COM CAROTID INNOMINATE UNI L MOD SED  12/14/2017   IR ANGIO VERTEBRAL SEL VERTEBRAL BILAT MOD SED  09/15/2017   IR RADIOLOGIST EVAL & MGMT  09/10/2017   IR RADIOLOGIST EVAL & MGMT  10/19/2017   lymphatic mass surgery     NASAL TURBINATE REDUCTION     RADIOLOGY WITH ANESTHESIA N/A 12/14/2017   Procedure: RADIOLOGY WITH ANESTHESIA EMBOLIZATION;  Surgeon: Julieanne Cotton, MD;  Location: [facility] OR;  Service: Radiology;  Laterality: N/A;   VIDEO BRONCHOSCOPY Bilateral 11/18/2017   Procedure: VIDEO BRONCHOSCOPY WITHOUT FLUORO;  Surgeon: Leslye Peer, MD;  Location: [facility] END[facility]COPY;  Service: Cardiopulmonary;  Laterality: Bilateral;    Family History  Problem Relation Age of Onset   Hypertension Mother    Asthma Sister    Colon cancer Neg Hx    Allergic rhinitis Neg Hx    Eczema Neg Hx  Social History   Socioeconomic History   Marital status: Divorced    Spouse name: Not on file   Number of children: 5   Years of education: Not on file   Highest education level: Master's degree (e.g., MA, MS, MEng, MEd, MSW, MBA)  Occupational History   Occupation: Unemployed  Tobacco Use   Smoking status: Never    Passive exposure: Never   Smokeless tobacco: Never  Vaping Use   Vaping Use: Never used  Substance and Sexual Activity   Alcohol use: No   Drug use: No   Sexual activity: Not Currently    Birth control/protection: Post-menopausal, None  Other Topics Concern   Not on  file  Social History Narrative   Lives alone.  5 children with two deceased.     Social Determinants of Health   Financial Resource Strain: Medium Risk (11/20/2022)   Overall Financial Resource Strain (CARDIA)    Difficulty of Paying Living Expenses: Somewhat hard  Food Insecurity: Food Insecurity Present (11/20/2022)   Hunger Vital Sign    Worried About Running Out of Food in the Last Year: Often true    Ran Out of Food in the Last Year: Sometimes true  Transportation Needs: Unmet Transportation Needs (11/20/2022)   PRAPARE - Administrator, Civil Service (Medical): Yes    Lack of Transportation (Non-Medical): Yes  Physical Activity: Insufficiently Active (11/20/2022)   Exercise Vital Sign    Days of Exercise per Week: 1 day    Minutes of Exercise per Session: 10 min  Stress: No Stress Concern Present (11/20/2022)   [employer] of Occupational Health - Occupational Stress Questionnaire    Feeling of Stress : Only a little  Social Connections: Moderately Integrated (11/20/2022)   Social Connection and Isolation Panel [NHANES]    Frequency of Communication with Friends and Family: More than three times a week    Frequency of Social Gatherings with Friends and Family: Twice a week    Attends Religious Services: More than 4 times per year    Active Member of [employer] or Organizations: Yes    Attends Banker Meetings: More than 4 times per year    Marital Status: Widowed  Recent Concern: Social Connections - Moderately Isolated (09/09/2022)   Social Connection and Isolation Panel [NHANES]    Frequency of Communication with Friends and Family: Three times a week    Frequency of Social Gatherings with Friends and Family: Once a week    Attends Religious Services: 1 to 4 times per year    Active Member of [employer] or Organizations: No    Attends Banker Meetings: Never    Marital Status: Widowed    Allergies  Allergen Reactions   Dust Mite Extract  Cough   Peanut-Containing Drug Products Cough   Ace Inhibitors Cough   Lovastatin Other (See Comments)    Generalized body pain    Outpatient Medications Prior to Visit  Medication Sig Dispense Refill   acetaminophen (TYLENOL) 500 MG tablet Take 500 mg by mouth every 6 (six) hours as needed for fever, moderate pain or mild pain.     albuterol (PROAIR HFA) 108 (90 Base) MCG/ACT inhaler Inhale 1-2 puffs into the lungs every 6 (six) hours as needed for wheezing or shortness of breath. 6.7 g 2   albuterol (PROVENTIL) (2.5 MG/3ML) 0.083% nebulizer solution Take 3 mLs (1 vial) by nebulization every 6 hours as needed for wheezing or shortness of breath. 90 mL 1  amLODipine (NORVASC) 5 MG tablet Take 1 tablet (5 mg total) by mouth daily. 30 tablet 0   apixaban (ELIQUIS) 5 MG TABS tablet Take 1 tablet (5 mg total) by mouth 2 (two) times daily. 180 tablet 1   atorvastatin (LIPITOR) 80 MG tablet Take 1 tablet (80 mg total) by mouth daily. 30 tablet 0   calcium acetate (PHOSLO) 667 MG capsule Take 1 capsule by mouth 3 times daily 270 capsule 6   carvedilol (COREG) 12.5 MG tablet Take 1 tablet (12.5 mg total) by mouth 2 (two) times daily with a meal. (Take once a day in the evening on hemodialysis days) 180 tablet 1   ethyl chloride spray Apply a small amount on skin immediately before cannulation 116 mL PRN   gabapentin (NEURONTIN) 100 MG capsule Take 1 capsule (100 mg total) by mouth 3 (three) times daily. 270 capsule 1   glipiZIDE (GLUCOTROL XL) 2.5 MG 24 hr tablet Take 1 tablet (2.5 mg total) by mouth daily with breakfast. (needs office visit) 30 tablet 0   glucose blood (ACCU-CHEK GUIDE) test strip USE AS INSTRUCTED 2 TIMES DAILY 100 each 2   Homeopathic Products (EARACHE DROPS OT) Place 1 drop into both ears daily.     isosorbide mononitrate (IMDUR) 60 MG 24 hr tablet Take 1 tablet (60 mg total) by mouth daily. 30 tablet 0   lidocaine-prilocaine (EMLA) cream Apply a small amount to dialysis access  one hour prior to dialysis. Wrap with plastic wrap. 30 g PRN   loratadine (CLARITIN) 10 MG tablet Take 1 tablet (10 mg total) by mouth daily. 30 tablet 0   Misc. Devices [employer]  Diagnosis - unstable gait 1 each 0   Misc. Devices MISC Fistula sleeve for R arm.  Diagnoses: End-stage renal disease on hemodialysis 1 each 0   Misc. Devices MISC 1. Incontinence supplies.  2. Briefs.  Diagnosis-urinary incontinence 1 each 11   Misc. Devices MISC Nepro dialysis protein drink.  Diagnosis end-stage renal disease 30 each 11   Misc. Devices MISC Blood pressure monitor.  Diagnosis hypertension 1 each 0   olopatadine (PATANOL) 0.1 % ophthalmic solution Place 1 drop into both eyes 2 (two) times daily. 5 mL 12   Polyethyl Glycol-Propyl Glycol (SYSTANE OP) Place 1 drop into both eyes daily.     traZODone (DESYREL) 50 MG tablet Take 1 tablet (50 mg total) by mouth at bedtime as needed for sleep. 30 tablet 1   azelastine (ASTELIN) 0.1 % nasal spray Place 2 sprays into both nostrils 2 (two) times daily. Use in each nostril as directed 30 mL 12   fluticasone (FLONASE) 50 MCG/ACT nasal spray Place 2 sprays into both nostrils daily. 16 g 6   No facility-administered medications prior to visit.     ROS Review of Systems  Constitutional:  Negative for activity change and appetite change.  HENT:  Negative for sinus pressure and sore throat.   Respiratory:  Negative for chest tightness, shortness of breath and wheezing.   Cardiovascular:  Negative for chest pain and palpitations.  Gastrointestinal:  Negative for abdominal distention, abdominal pain and constipation.  Genitourinary: Negative.   Musculoskeletal:        See HPI  Psychiatric/Behavioral:  Negative for behavioral problems and dysphoric mood.     Objective:  BP 125/63   Pulse 80   Ht 5\' 2"  (1.575 m)   Wt 158 lb 6.4 oz (71.8 kg)   LMP  (LMP Unknown)   SpO2 97%  BMI 28.97 kg/m      01/08/2023   10:24 AM 11/20/2022   11:00 AM 11/13/2022     8:26 AM  BP/Weight  Systolic BP 125 147 150  Diastolic BP 63 66 84  Wt. (Lbs) 158.4 157 154  BMI 28.97 kg/m2 28.72 kg/m2 28.17 kg/m2      Physical Exam Constitutional:      Appearance: She is well-developed.  Cardiovascular:     Rate and Rhythm: Normal rate.     Heart sounds: Normal heart sounds. No murmur heard. Pulmonary:     Effort: Pulmonary effort is normal.     Breath sounds: Normal breath sounds. No wheezing or rales.  Chest:     Chest wall: No tenderness.  Abdominal:     General: Bowel sounds are normal. There is no distension.     Palpations: Abdomen is soft. There is no mass.     Tenderness: There is no abdominal tenderness.  Musculoskeletal:        General: Normal range of motion.     Right lower leg: No edema.     Left lower leg: No edema.     Comments: Slight tenderness on palpation of medial aspect of right knee  Neurological:     Mental Status: She is alert and oriented to person, place, and time.  Psychiatric:        Mood and Affect: Mood normal.        Latest Ref Rng & Units 04/05/2022    2:50 PM 03/25/2022    1:17 PM 12/29/2021    1:52 AM  CMP  Glucose 70 - 99 mg/dL 409  811  914   BUN 8 - 23 mg/dL 43  42  50   Creatinine 0.44 - 1.00 mg/dL 7.82  9.56  2.13   Sodium 135 - 145 mmol/L 134  133  130   Potassium 3.5 - 5.1 mmol/L 4.0  4.2  3.8   Chloride 98 - 111 mmol/L 93  92  94   CO2 22 - 32 mmol/L 25  31  26    Calcium 8.9 - 10.3 mg/dL 9.5  9.2  8.4   Total Protein 6.5 - 8.1 g/dL  6.8    Total Bilirubin 0.3 - 1.2 mg/dL  0.5    Alkaline Phos 38 - 126 U/L  83    AST 15 - 41 U/L  17    ALT 0 - 44 U/L  17      Lipid Panel     Component Value Date/Time   CHOL 106 09/01/2021 1041   CHOL 121 06/17/2021 0904   TRIG 61 09/01/2021 1041   HDL 46 09/01/2021 1041   HDL 46 06/17/2021 0904   CHOLHDL 2.3 09/01/2021 1041   VLDL 12 09/01/2021 1041   LDLCALC 48 09/01/2021 1041   LDLCALC 61 06/17/2021 0904    CBC    Component Value Date/Time   WBC  7.9 04/05/2022 1450   RBC 3.86 (L) 04/05/2022 1450   HGB 11.3 (L) 04/05/2022 1450   HGB 12.4 09/02/2018 1211   HCT 33.0 (L) 04/05/2022 1450   HCT 37.3 09/02/2018 1211   PLT 209 04/05/2022 1450   PLT 203 09/02/2018 1211   MCV 85.5 04/05/2022 1450   MCV 87 09/02/2018 1211   MCH 29.3 04/05/2022 1450   MCHC 34.2 04/05/2022 1450   RDW 14.8 04/05/2022 1450   RDW 13.1 09/02/2018 1211   LYMPHSABS 1.2 03/25/2022 1317   LYMPHSABS 1.2 09/02/2018 1211  MONOABS 0.7 03/25/2022 1317   EOSABS 0.2 03/25/2022 1317   EOSABS 0.1 09/02/2018 1211   BASOSABS 0.0 03/25/2022 1317   BASOSABS 0.0 09/02/2018 1211    Lab Results  Component Value Date   HGBA1C 6.6 11/20/2022    Assessment & Plan:  1. Muscle spasm This occurs mostly after dialysis Discussed sedating side effects of muscle relaxants and she will be taking this prior to dialysis sessions to prevent falls - tiZANidine (ZANAFLEX) 4 MG tablet; Take 1 tablet (4 mg total) by mouth every other day. As needed before hemodialysis sessions for muscle spasms  Dispense: 60 tablet; Refill: 1 - Ambulatory referral to Physical Therapy  2. Chronic pain of both knees Will need to exclude osteoarthritis Fall could also have been secondary to inner ear pathology and I have provided her with the number to the ENT where she was referred - DG Knee Complete 4 Views Right; Future - DG Knee Complete 4 Views Left; Future - diclofenac Sodium (VOLTAREN) 1 % GEL; Apply 4 g topically 4 (four) times daily.  Dispense: 100 g; Refill: 1  3. Upper airway cough syndrome Uncontrolled Worsened by persistent postnasal drip I have refilled both sprays for her Would love to place on a short course of prednisone but daughter states in the past she did not do well with prednisone - benzonatate (TESSALON) 100 MG capsule; Take 1 capsule (100 mg total) by mouth 2 (two) times daily as needed for cough.  Dispense: 20 capsule; Refill: 0 - azelastine (ASTELIN) 0.1 % nasal spray;  Place 2 sprays into both nostrils 2 (two) times daily. Use in each nostril as directed  Dispense: 30 mL; Refill: 12 - fluticasone (FLONASE) 50 MCG/ACT nasal spray; Place 2 sprays into both nostrils daily.  Dispense: 16 g; Refill: 12    Meds ordered this encounter  Medications   benzonatate (TESSALON) 100 MG capsule    Sig: Take 1 capsule (100 mg total) by mouth 2 (two) times daily as needed for cough.    Dispense:  20 capsule    Refill:  0   azelastine (ASTELIN) 0.1 % nasal spray    Sig: Place 2 sprays into both nostrils 2 (two) times daily. Use in each nostril as directed    Dispense:  30 mL    Refill:  12   fluticasone (FLONASE) 50 MCG/ACT nasal spray    Sig: Place 2 sprays into both nostrils daily.    Dispense:  16 g    Refill:  12   tiZANidine (ZANAFLEX) 4 MG tablet    Sig: Take 1 tablet (4 mg total) by mouth every other day. As needed before hemodialysis sessions for muscle spasms    Dispense:  60 tablet    Refill:  1   diclofenac Sodium (VOLTAREN) 1 % GEL    Sig: Apply 4 g topically 4 (four) times daily.    Dispense:  100 g    Refill:  1    Follow-up: Return in about 3 months (around 04/10/2023) for Chronic medical conditions.       Hoy Register, MD, FAAFP. [facility] [place], [place] [phone]   01/08/2023, 10:58 AM

## 2023-01-08 NOTE — Progress Notes (Signed)
Muscle aches Cough.

## 2023-01-09 ENCOUNTER — Other Ambulatory Visit (HOSPITAL_COMMUNITY): Payer: Self-pay

## 2023-01-09 ENCOUNTER — Other Ambulatory Visit: Payer: Self-pay

## 2023-01-09 DIAGNOSIS — N186 End stage renal disease: Secondary | ICD-10-CM | POA: Diagnosis not present

## 2023-01-09 DIAGNOSIS — I129 Hypertensive chronic kidney disease with stage 1 through stage 4 chronic kidney disease, or unspecified chronic kidney disease: Secondary | ICD-10-CM | POA: Diagnosis not present

## 2023-01-09 DIAGNOSIS — Z992 Dependence on renal dialysis: Secondary | ICD-10-CM | POA: Diagnosis not present

## 2023-01-09 DIAGNOSIS — N2581 Secondary hyperparathyroidism of renal origin: Secondary | ICD-10-CM | POA: Diagnosis not present

## 2023-01-12 DIAGNOSIS — N186 End stage renal disease: Secondary | ICD-10-CM | POA: Diagnosis not present

## 2023-01-12 DIAGNOSIS — Z992 Dependence on renal dialysis: Secondary | ICD-10-CM | POA: Diagnosis not present

## 2023-01-12 DIAGNOSIS — N2581 Secondary hyperparathyroidism of renal origin: Secondary | ICD-10-CM | POA: Diagnosis not present

## 2023-01-12 DIAGNOSIS — L299 Pruritus, unspecified: Secondary | ICD-10-CM | POA: Diagnosis not present

## 2023-01-14 ENCOUNTER — Other Ambulatory Visit (HOSPITAL_COMMUNITY): Payer: Self-pay

## 2023-01-14 DIAGNOSIS — N2581 Secondary hyperparathyroidism of renal origin: Secondary | ICD-10-CM | POA: Diagnosis not present

## 2023-01-14 DIAGNOSIS — Z992 Dependence on renal dialysis: Secondary | ICD-10-CM | POA: Diagnosis not present

## 2023-01-14 DIAGNOSIS — N186 End stage renal disease: Secondary | ICD-10-CM | POA: Diagnosis not present

## 2023-01-14 DIAGNOSIS — L299 Pruritus, unspecified: Secondary | ICD-10-CM | POA: Diagnosis not present

## 2023-01-14 MED ORDER — CALCIUM ACETATE (PHOS BINDER) 667 MG PO CAPS
667.0000 mg | ORAL_CAPSULE | Freq: Three times a day (TID) | ORAL | 2 refills | Status: DC
  Filled 2023-01-14: qty 270, 90d supply, fill #0
  Filled 2023-04-15: qty 270, 90d supply, fill #1
  Filled 2023-07-16: qty 270, 90d supply, fill #2

## 2023-01-15 ENCOUNTER — Other Ambulatory Visit (HOSPITAL_COMMUNITY): Payer: Self-pay

## 2023-01-16 DIAGNOSIS — Z992 Dependence on renal dialysis: Secondary | ICD-10-CM | POA: Diagnosis not present

## 2023-01-16 DIAGNOSIS — L299 Pruritus, unspecified: Secondary | ICD-10-CM | POA: Diagnosis not present

## 2023-01-16 DIAGNOSIS — N2581 Secondary hyperparathyroidism of renal origin: Secondary | ICD-10-CM | POA: Diagnosis not present

## 2023-01-16 DIAGNOSIS — N186 End stage renal disease: Secondary | ICD-10-CM | POA: Diagnosis not present

## 2023-01-19 ENCOUNTER — Other Ambulatory Visit (HOSPITAL_COMMUNITY): Payer: Self-pay

## 2023-01-19 DIAGNOSIS — Z992 Dependence on renal dialysis: Secondary | ICD-10-CM | POA: Diagnosis not present

## 2023-01-19 DIAGNOSIS — N186 End stage renal disease: Secondary | ICD-10-CM | POA: Diagnosis not present

## 2023-01-19 DIAGNOSIS — L299 Pruritus, unspecified: Secondary | ICD-10-CM | POA: Diagnosis not present

## 2023-01-19 DIAGNOSIS — N2581 Secondary hyperparathyroidism of renal origin: Secondary | ICD-10-CM | POA: Diagnosis not present

## 2023-01-20 ENCOUNTER — Other Ambulatory Visit (HOSPITAL_COMMUNITY): Payer: Self-pay

## 2023-01-21 DIAGNOSIS — L299 Pruritus, unspecified: Secondary | ICD-10-CM | POA: Diagnosis not present

## 2023-01-21 DIAGNOSIS — N186 End stage renal disease: Secondary | ICD-10-CM | POA: Diagnosis not present

## 2023-01-21 DIAGNOSIS — Z992 Dependence on renal dialysis: Secondary | ICD-10-CM | POA: Diagnosis not present

## 2023-01-21 DIAGNOSIS — N2581 Secondary hyperparathyroidism of renal origin: Secondary | ICD-10-CM | POA: Diagnosis not present

## 2023-01-21 NOTE — Therapy (Signed)
OUTPATIENT PHYSICAL THERAPY CERVICAL EVALUATION   Patient Name: Paula [name] MRN: [id number] DOB:1945-08-16, 77 y.o., female Today's Date: 01/22/2023  END OF SESSION:  PT End of Session - 01/22/23 1208     Visit Number 1    Date for PT Re-Evaluation 04/02/23    PT Start Time 1107    PT Stop Time 1150    PT Time Calculation (min) 43 min    Activity Tolerance Patient limited by pain    Behavior During Therapy [facility] for tasks assessed/performed             Past Medical History:  Diagnosis Date   Allergic rhinitis    Arthritis    Asthma    Brain aneurysm    Chronic kidney disease    Cough    Diabetes mellitus    Diastolic CHF, chronic (HCC) 10/11/2011   GERD (gastroesophageal reflux disease)    History of colon polyps 2012   tubular adenoma    Hyperlipidemia    Hypertension    Neuropathy 10/11/2011   PONV (postoperative nausea and vomiting)    one time after lymph node surgery   Past Surgical History:  Procedure Laterality Date   AV FISTULA PLACEMENT Right 10/15/2021   Procedure: ARTERIOVENOUS (AV) FISTULA  WITH PLACEMENT OF GORE-TEX STRETCH  GRAFT (4-7mmx45cm);  Surgeon: Chuck Hint, MD;  Location: [facility] OR;  Service: Vascular;  Laterality: Right;   CATARACT EXTRACTION     right eye   IR 3D INDEPENDENT WKST  12/14/2017   IR ANGIO INTRA EXTRACRAN SEL COM CAROTID INNOMINATE BILAT MOD SED  09/15/2017   IR ANGIO INTRA EXTRACRAN SEL COM CAROTID INNOMINATE UNI L MOD SED  12/14/2017   IR ANGIO VERTEBRAL SEL VERTEBRAL BILAT MOD SED  09/15/2017   IR RADIOLOGIST EVAL & MGMT  09/10/2017   IR RADIOLOGIST EVAL & MGMT  10/19/2017   lymphatic mass surgery     NASAL TURBINATE REDUCTION     RADIOLOGY WITH ANESTHESIA N/A 12/14/2017   Procedure: RADIOLOGY WITH ANESTHESIA EMBOLIZATION;  Surgeon: Julieanne Cotton, MD;  Location: [facility] OR;  Service: Radiology;  Laterality: N/A;   VIDEO BRONCHOSCOPY Bilateral 11/18/2017   Procedure: VIDEO BRONCHOSCOPY WITHOUT FLUORO;   Surgeon: Leslye Peer, MD;  Location: [facility] ENDOSCOPY;  Service: Cardiopulmonary;  Laterality: Bilateral;   Patient Active Problem List   Diagnosis Date Noted   SNHL (sensory-neural hearing loss), 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   ESRD on dialysis (HCC) 11/20/2022   Mold exposure 08/14/2022   Opioid withdrawal (HCC) 08/14/2022   ILD (interstitial lung disease) (HCC) 08/14/2022   Secondary hyperparathyroidism, renal (HCC) 08/14/2022   Paroxysmal A-fib (HCC) 05/20/2022   Hypertrophy of nasal turbinates 09/05/2021   Chronic kidney disease, stage 5 (HCC)    Aortic atherosclerosis (HCC) 06/24/2021   Chronic rhinitis 10/01/2017   Seasonal allergic conjunctivitis 10/01/2017   Aneurysm (HCC) 09/24/2017   Acute metabolic encephalopathy 09/02/2017   Allergic rhinitis 08/20/2017   Palpitations 07/10/2016   Gout 01/23/2016   History of cataract 02/27/2015   Seasonal and perennial allergic rhinitis 02/15/2012   Chronic diastolic CHF (congestive heart failure) (HCC) 10/11/2011   GERD (gastroesophageal reflux disease)    Type 2 diabetes mellitus without complication, without long-term current use of insulin (HCC) 07/03/2010   Hyperlipidemia 07/03/2010   Essential hypertension 07/03/2010    PCP: Hoy Register, MD  REFERRING PROVIDER: Hoy Register, MD  REFERRING DIAG: muscle spasm  THERAPY DIAG:  Difficulty in walking, not elsewhere classified  Muscle weakness (generalized)  Other low  back pain  Acute pain of right shoulder  Other muscle spasm  Rationale for Evaluation and Treatment: Rehabilitation  ONSET DATE: 01/08/23  SUBJECTIVE:                                                                                                                                                                                                         SUBJECTIVE STATEMENT: Patient reports that she fell when trying to sit while in dialysis. She reports pain in her back, B hips. She was very limited in  standing prior to her fall, but now seems worse. She also reports R shoulder pain and neck muscle spasms.  PERTINENT HISTORY:  Per referring physician notes: Cerv muscle spasm, mostly occurs during dialysis-started on muscle relaxer, B chronic knee pain Recent fall, landed on R knee-ENT consult pending.   PAIN:  Are you having pain? Yes: NPRS scale: 10/10 Pain location: back, hips, L knee neck, R shoulder Pain description: it hurts Aggravating factors: standing  Relieving factors: sit down, Tylenol  PRECAUTIONS: Fall  WEIGHT BEARING RESTRICTIONS: No  FALLS:  Has patient fallen in last 6 months? Yes. Number of falls 1  LIVING ENVIRONMENT: Lives with: lives with their family Lives in: House/apartment Stairs: Yes: External: 14 steps; bilateral but cannot reach both Having difficulty on steps Has following equipment at home: Single point cane and Walker - 2 wheeled  OCCUPATION: N/A  PLOF: Independent  PATIENT GOALS: Be able to stand and move on her own without pain  NEXT MD VISIT: August  OBJECTIVE:   DIAGNOSTIC FINDINGS:  X rays for L knee ordered.  COGNITION: Overall cognitive status: Within functional limits for tasks assessed  SENSATION: Patient reports episode of L fingers tingling during dialysis  POSTURE: rounded shoulders  PALPATION: TTP B piriformis, mildly over glut origins B, mildly in R shoulder, ant, supraspinatus tendon   CERVICAL ROM: moderately limited in B lateral flex with C/O pain in L neck   UPPER EXTREMITY ROM: Shoulder flex limited B due to C/O back pain.   UPPER EXTREMITY MMT: limited testing due to pain in back with resistance at shoulders. B elbows 4/5  CERVICAL SPECIAL TESTS:  Spurling's test: Negative  LUMBAR ROM: moderately limited in all planes due to back pain  LE ROM AND STRENGTH: SLR 60 degrees L, 70 degrees R, Knee to chest on L WNL, but pulls on ant R hip, B ER mildly limited, otherwise ROM WFL. Strength- 4-/5 B, except  hip ext, abd 3+/5 FUNCTIONAL TESTS:  5 times sit to stand: TBD Functional gait assessment:  TBD OWESTRY: 52% disability  TODAY'S TREATMENT:                                                                                                                              DATE:  01/22/23 Education   PATIENT EDUCATION:  Education details: POC Person educated: Patient Education method: Explanation Education comprehension: verbalized understanding  HOME EXERCISE PROGRAM: TBD  ASSESSMENT:  CLINICAL IMPRESSION: Patient is a 77 y.o. who was seen today for physical therapy evaluation and treatment for Multiple areas of pain after falling. Dialysis M, W, Sat. Her primary source of pain is her low back. She cannot stand for more than 5 minutes, is having increased difficulty managing steps. She also reports L neck spasms during dialysis, and R shoulder pain as well as L knee pain. She demonstrates weakness and mildly limited ROM in most areas of her body. She will benefit from PT to address the spasms as well as improve ROM and strength in order to return to her PLOF and manage her pain.  OBJECTIVE IMPAIRMENTS: Abnormal gait, decreased activity tolerance, decreased balance, decreased endurance, decreased mobility, difficulty walking, decreased ROM, decreased strength, increased muscle spasms, and pain.   ACTIVITY LIMITATIONS: carrying, lifting, bending, standing, squatting, stairs, and locomotion level  PARTICIPATION LIMITATIONS: meal prep, cleaning, laundry, shopping, and community activity  PERSONAL FACTORS: Past/current experiences are also affecting patient's functional outcome.   REHAB POTENTIAL: Good  CLINICAL DECISION MAKING: Stable/uncomplicated  EVALUATION COMPLEXITY: Moderate   GOALS: Goals reviewed with patient? Yes  SHORT TERM GOALS: Target date: 02/07/23  I with initial HEP Baseline:  Goal status: INITIAL  LONG TERM GOALS: Target date: 04/02/23  I with final HEP Baseline:   Goal status: INITIAL  2.  Improve Owestry score to < 25% for minimal disability Baseline: 52% Goal status: INITIAL  3.  Patient will demonstrate full, pain free ROM of B shoulders and hips Baseline: Movement causes back pain Goal status: INITIAL  4.  Increase B hip strength to at least 4/5  Baseline: 3+/5 Goal status: INITIAL  5.  Patient will score at least 24/30 on FGA Baseline: TBD Goal status: INITIAL  6.  Patient will safely ambulate up and down at least 12 step using rail and S. Baseline: up to min A on steps Goal status: INITIAL   PLAN:  PT FREQUENCY: 1-2x/week  PT DURATION: 10 weeks  PLANNED INTERVENTIONS: Therapeutic exercises, Therapeutic activity, Neuromuscular re-education, Balance training, Gait training, Patient/Family education, Self Care, Joint mobilization, Stair training, Dry Needling, Electrical stimulation, Spinal mobilization, Cryotherapy, Moist heat, Ionotophoresis 4mg /ml Dexamethasone, and Manual therapy  PLAN FOR NEXT SESSION: Complete 5 x STS, FGA, initiate HEP for stretch and strengthening of Upper and Lower trunk   Iona Beard, DPT 01/22/2023, 12:18 PM

## 2023-01-22 ENCOUNTER — Encounter: Payer: Self-pay | Admitting: Physical Therapy

## 2023-01-22 ENCOUNTER — Ambulatory Visit: Payer: Medicare HMO | Attending: Family Medicine | Admitting: Physical Therapy

## 2023-01-22 DIAGNOSIS — R262 Difficulty in walking, not elsewhere classified: Secondary | ICD-10-CM | POA: Insufficient documentation

## 2023-01-22 DIAGNOSIS — M5459 Other low back pain: Secondary | ICD-10-CM | POA: Diagnosis not present

## 2023-01-22 DIAGNOSIS — M25511 Pain in right shoulder: Secondary | ICD-10-CM | POA: Insufficient documentation

## 2023-01-22 DIAGNOSIS — M6281 Muscle weakness (generalized): Secondary | ICD-10-CM | POA: Diagnosis not present

## 2023-01-22 DIAGNOSIS — M62838 Other muscle spasm: Secondary | ICD-10-CM | POA: Diagnosis not present

## 2023-01-22 DIAGNOSIS — R6889 Other general symptoms and signs: Secondary | ICD-10-CM | POA: Diagnosis not present

## 2023-01-23 DIAGNOSIS — L299 Pruritus, unspecified: Secondary | ICD-10-CM | POA: Diagnosis not present

## 2023-01-23 DIAGNOSIS — N2581 Secondary hyperparathyroidism of renal origin: Secondary | ICD-10-CM | POA: Diagnosis not present

## 2023-01-23 DIAGNOSIS — N186 End stage renal disease: Secondary | ICD-10-CM | POA: Diagnosis not present

## 2023-01-23 DIAGNOSIS — Z992 Dependence on renal dialysis: Secondary | ICD-10-CM | POA: Diagnosis not present

## 2023-01-26 DIAGNOSIS — Z992 Dependence on renal dialysis: Secondary | ICD-10-CM | POA: Diagnosis not present

## 2023-01-26 DIAGNOSIS — N2581 Secondary hyperparathyroidism of renal origin: Secondary | ICD-10-CM | POA: Diagnosis not present

## 2023-01-26 DIAGNOSIS — N186 End stage renal disease: Secondary | ICD-10-CM | POA: Diagnosis not present

## 2023-01-26 DIAGNOSIS — L299 Pruritus, unspecified: Secondary | ICD-10-CM | POA: Diagnosis not present

## 2023-01-27 ENCOUNTER — Ambulatory Visit: Payer: Medicare HMO | Admitting: Physical Therapy

## 2023-01-27 ENCOUNTER — Encounter: Payer: Self-pay | Admitting: Physical Therapy

## 2023-01-27 DIAGNOSIS — M5459 Other low back pain: Secondary | ICD-10-CM

## 2023-01-27 DIAGNOSIS — R262 Difficulty in walking, not elsewhere classified: Secondary | ICD-10-CM | POA: Diagnosis not present

## 2023-01-27 DIAGNOSIS — M25511 Pain in right shoulder: Secondary | ICD-10-CM

## 2023-01-27 DIAGNOSIS — M6281 Muscle weakness (generalized): Secondary | ICD-10-CM

## 2023-01-27 DIAGNOSIS — M62838 Other muscle spasm: Secondary | ICD-10-CM | POA: Diagnosis not present

## 2023-01-27 NOTE — Therapy (Signed)
OUTPATIENT PHYSICAL THERAPY CERVICAL EVALUATION   Patient Name: Paula [name] MRN: [id number] DOB:October 14, 1945, 77 y.o., female Today's Date: 01/27/2023  END OF SESSION:  PT End of Session - 01/27/23 1313     Visit Number 2    Date for PT Re-Evaluation 04/02/23    PT Start Time 1313    PT Stop Time 1345    PT Time Calculation (min) 32 min    Activity Tolerance Patient limited by pain    Behavior During Therapy [facility] for tasks assessed/performed             Past Medical History:  Diagnosis Date   Allergic rhinitis    Arthritis    Asthma    Brain aneurysm    Chronic kidney disease    Cough    Diabetes mellitus    Diastolic CHF, chronic (HCC) 10/11/2011   GERD (gastroesophageal reflux disease)    History of colon polyps 2012   tubular adenoma    Hyperlipidemia    Hypertension    Neuropathy 10/11/2011   PONV (postoperative nausea and vomiting)    one time after lymph node surgery   Past Surgical History:  Procedure Laterality Date   AV FISTULA PLACEMENT Right 10/15/2021   Procedure: ARTERIOVENOUS (AV) FISTULA  WITH PLACEMENT OF GORE-TEX STRETCH  GRAFT (4-5mmx45cm);  Surgeon: Chuck Hint, MD;  Location: [facility] OR;  Service: Vascular;  Laterality: Right;   CATARACT EXTRACTION     right eye   IR 3D INDEPENDENT WKST  12/14/2017   IR ANGIO INTRA EXTRACRAN SEL COM CAROTID INNOMINATE BILAT MOD SED  09/15/2017   IR ANGIO INTRA EXTRACRAN SEL COM CAROTID INNOMINATE UNI L MOD SED  12/14/2017   IR ANGIO VERTEBRAL SEL VERTEBRAL BILAT MOD SED  09/15/2017   IR RADIOLOGIST EVAL & MGMT  09/10/2017   IR RADIOLOGIST EVAL & MGMT  10/19/2017   lymphatic mass surgery     NASAL TURBINATE REDUCTION     RADIOLOGY WITH ANESTHESIA N/A 12/14/2017   Procedure: RADIOLOGY WITH ANESTHESIA EMBOLIZATION;  Surgeon: Julieanne Cotton, MD;  Location: [facility] OR;  Service: Radiology;  Laterality: N/A;   VIDEO BRONCHOSCOPY Bilateral 11/18/2017   Procedure: VIDEO BRONCHOSCOPY WITHOUT FLUORO;   Surgeon: Leslye Peer, MD;  Location: [facility];  Service: Cardiopulmonary;  Laterality: Bilateral;   Patient Active Problem List   Diagnosis Date Noted   SNHL (sensory-neural hearing loss), 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   ESRD on dialysis (HCC) 11/20/2022   Mold exposure 08/14/2022   Opioid withdrawal (HCC) 08/14/2022   ILD (interstitial lung disease) (HCC) 08/14/2022   Secondary hyperparathyroidism, renal (HCC) 08/14/2022   Paroxysmal A-fib (HCC) 05/20/2022   Hypertrophy of nasal turbinates 09/05/2021   Chronic kidney disease, stage 5 (HCC)    Aortic atherosclerosis (HCC) 06/24/2021   Chronic rhinitis 10/01/2017   Seasonal allergic conjunctivitis 10/01/2017   Aneurysm (HCC) 09/24/2017   Acute metabolic encephalopathy 09/02/2017   Allergic rhinitis 08/20/2017   Palpitations 07/10/2016   Gout 01/23/2016   History of cataract 02/27/2015   Seasonal and perennial allergic rhinitis 02/15/2012   Chronic diastolic CHF (congestive heart failure) (HCC) 10/11/2011   GERD (gastroesophageal reflux disease)    Type 2 diabetes mellitus without complication, without long-term current use of insulin (HCC) 07/03/2010   Hyperlipidemia 07/03/2010   Essential hypertension 07/03/2010    PCP: Hoy Register, MD  REFERRING PROVIDER: Hoy Register, MD  REFERRING DIAG: muscle spasm  THERAPY DIAG:  Difficulty in walking, not elsewhere classified  Muscle weakness (generalized)  Other low  back pain  Acute pain of right shoulder  Rationale for Evaluation and Treatment: Rehabilitation  ONSET DATE: 01/08/23  SUBJECTIVE:                                                                                                                                                                                                         SUBJECTIVE STATEMENT: Doing ok, some pain I the L knee   PERTINENT HISTORY:  Per referring physician notes: Cerv muscle spasm, mostly occurs during dialysis-started on muscle  relaxer, B chronic knee pain Recent fall, landed on R knee-ENT consult pending.   PAIN:  Are you having pain? Yes: NPRS scale: 0/10 Pain location: back, hips, L knee neck, R shoulder Pain description: it hurts Aggravating factors: standing  Relieving factors: sit down, Tylenol  PRECAUTIONS: Fall  WEIGHT BEARING RESTRICTIONS: No  FALLS:  Has patient fallen in last 6 months? Yes. Number of falls 1  LIVING ENVIRONMENT: Lives with: lives with their family Lives in: House/apartment Stairs: Yes: External: 14 steps; bilateral but cannot reach both Having difficulty on steps Has following equipment at home: Single point cane and Walker - 2 wheeled  OCCUPATION: N/A  PLOF: Independent  PATIENT GOALS: Be able to stand and move on her own without pain  NEXT MD VISIT: August  OBJECTIVE:   DIAGNOSTIC FINDINGS:  X rays for L knee ordered.  COGNITION: Overall cognitive status: Within functional limits for tasks assessed  SENSATION: Patient reports episode of L fingers tingling during dialysis  POSTURE: rounded shoulders  PALPATION: TTP B piriformis, mildly over glut origins B, mildly in R shoulder, ant, supraspinatus tendon   CERVICAL ROM: moderately limited in B lateral flex with C/O pain in L neck   UPPER EXTREMITY ROM: Shoulder flex limited B due to C/O back pain.   UPPER EXTREMITY MMT: limited testing due to pain in back with resistance at shoulders. B elbows 4/5  CERVICAL SPECIAL TESTS:  Spurling's test: Negative  LUMBAR ROM: moderately limited in all planes due to back pain  LE ROM AND STRENGTH: SLR 60 degrees L, 70 degrees R, Knee to chest on L WNL, but pulls on ant R hip, B ER mildly limited, otherwise ROM WFL. Strength- 4-/5 B, except hip ext, abd 3+/5 FUNCTIONAL TESTS:  5 times sit to stand: 19.44 Functional gait assessment: TBD OWESTRY: 52% disability  TODAY'S TREATMENT:  DATE:  01/27/23 NuStep L3 x 5 min 5x S2S  LAQ 2lb 2x10 HS curls red 2x10 Ball squeeze 2x10 Hip Abd green 2x12  Standing marches   01/22/23 Education   PATIENT EDUCATION:  Education details: POC Person educated: Patient Education method: Explanation Education comprehension: verbalized understanding  HOME EXERCISE PROGRAM: TBD  ASSESSMENT:  CLINICAL IMPRESSION: Patient is a 77 y.o. who was seen today for physical therapy treatment for Multiple areas of pain after falling. Dialysis M, W, Sat. Her primary source of pain is her low back. She cannot stand for more than 5 minutes, is having increased difficulty managing steps. Pt was ~ 13 minutes late for today's session. Session consisted of light LE strengthening interventions. Baseline 5X S2S established. Cue not to allow LE push against table w/ S2S needed. Cues also needed of full ROM with leg curls abd extensions.  OBJECTIVE IMPAIRMENTS: Abnormal gait, decreased activity tolerance, decreased balance, decreased endurance, decreased mobility, difficulty walking, decreased ROM, decreased strength, increased muscle spasms, and pain.   ACTIVITY LIMITATIONS: carrying, lifting, bending, standing, squatting, stairs, and locomotion level  PARTICIPATION LIMITATIONS: meal prep, cleaning, laundry, shopping, and community activity  PERSONAL FACTORS: Past/current experiences are also affecting patient's functional outcome.   REHAB POTENTIAL: Good  CLINICAL DECISION MAKING: Stable/uncomplicated  EVALUATION COMPLEXITY: Moderate   GOALS: Goals reviewed with patient? Yes  SHORT TERM GOALS: Target date: 02/07/23  I with initial HEP Baseline:  Goal status: INITIAL  LONG TERM GOALS: Target date: 04/02/23  I with final HEP Baseline:  Goal status: INITIAL  2.  Improve Owestry score to < 25% for minimal disability Baseline: 52% Goal status: INITIAL  3.  Patient will demonstrate full, pain free  ROM of B shoulders and hips Baseline: Movement causes back pain Goal status: INITIAL  4.  Increase B hip strength to at least 4/5  Baseline: 3+/5 Goal status: INITIAL  5.  Patient will score at least 24/30 on FGA Baseline: TBD Goal status: INITIAL  6.  Patient will safely ambulate up and down at least 12 step using rail and S. Baseline: up to min A on steps Goal status: INITIAL   PLAN:  PT FREQUENCY: 1-2x/week  PT DURATION: 10 weeks  PLANNED INTERVENTIONS: Therapeutic exercises, Therapeutic activity, Neuromuscular re-education, Balance training, Gait training, Patient/Family education, Self Care, Joint mobilization, Stair training, Dry Needling, Electrical stimulation, Spinal mobilization, Cryotherapy, Moist heat, Ionotophoresis 4mg /ml Dexamethasone, and Manual therapy  PLAN FOR NEXT SESSION: Complete 5 x STS, FGA, initiate HEP for stretch and strengthening of Upper and Lower trunk   Iona Beard, DPT 01/27/2023, 1:14 PM

## 2023-01-28 ENCOUNTER — Other Ambulatory Visit (HOSPITAL_COMMUNITY): Payer: Self-pay

## 2023-01-28 DIAGNOSIS — N2581 Secondary hyperparathyroidism of renal origin: Secondary | ICD-10-CM | POA: Diagnosis not present

## 2023-01-28 DIAGNOSIS — L299 Pruritus, unspecified: Secondary | ICD-10-CM | POA: Diagnosis not present

## 2023-01-28 DIAGNOSIS — Z992 Dependence on renal dialysis: Secondary | ICD-10-CM | POA: Diagnosis not present

## 2023-01-28 DIAGNOSIS — N186 End stage renal disease: Secondary | ICD-10-CM | POA: Diagnosis not present

## 2023-01-29 ENCOUNTER — Ambulatory Visit: Payer: Medicare HMO | Admitting: Physical Therapy

## 2023-01-29 ENCOUNTER — Encounter: Payer: Self-pay | Admitting: Physical Therapy

## 2023-01-29 ENCOUNTER — Other Ambulatory Visit (HOSPITAL_COMMUNITY): Payer: Self-pay

## 2023-01-29 DIAGNOSIS — R053 Chronic cough: Secondary | ICD-10-CM | POA: Diagnosis not present

## 2023-01-29 DIAGNOSIS — M25511 Pain in right shoulder: Secondary | ICD-10-CM | POA: Diagnosis not present

## 2023-01-29 DIAGNOSIS — M5459 Other low back pain: Secondary | ICD-10-CM

## 2023-01-29 DIAGNOSIS — R6889 Other general symptoms and signs: Secondary | ICD-10-CM | POA: Diagnosis not present

## 2023-01-29 DIAGNOSIS — H903 Sensorineural hearing loss, bilateral: Secondary | ICD-10-CM | POA: Diagnosis not present

## 2023-01-29 DIAGNOSIS — M6281 Muscle weakness (generalized): Secondary | ICD-10-CM | POA: Diagnosis not present

## 2023-01-29 DIAGNOSIS — M26609 Unspecified temporomandibular joint disorder, unspecified side: Secondary | ICD-10-CM | POA: Diagnosis not present

## 2023-01-29 DIAGNOSIS — M62838 Other muscle spasm: Secondary | ICD-10-CM | POA: Diagnosis not present

## 2023-01-29 DIAGNOSIS — R262 Difficulty in walking, not elsewhere classified: Secondary | ICD-10-CM

## 2023-01-29 DIAGNOSIS — H61893 Other specified disorders of external ear, bilateral: Secondary | ICD-10-CM | POA: Insufficient documentation

## 2023-01-29 NOTE — Therapy (Signed)
OUTPATIENT PHYSICAL THERAPY CERVICAL EVALUATION   Patient Name: Paula [name] MRN: [id number] DOB:November 30, 1945, 77 y.o., female Today's Date: 01/29/2023  END OF SESSION:  PT End of Session - 01/29/23 1424     Visit Number 3    Date for PT Re-Evaluation 04/02/23    PT Start Time 1430    PT Stop Time 1515    PT Time Calculation (min) 45 min    Activity Tolerance Patient limited by pain    Behavior During Therapy [facility] for tasks assessed/performed             Past Medical History:  Diagnosis Date   Allergic rhinitis    Arthritis    Asthma    Brain aneurysm    Chronic kidney disease    Cough    Diabetes mellitus    Diastolic CHF, chronic (HCC) 10/11/2011   GERD (gastroesophageal reflux disease)    History of colon polyps 2012   tubular adenoma    Hyperlipidemia    Hypertension    Neuropathy 10/11/2011   PONV (postoperative nausea and vomiting)    one time after lymph node surgery   Past Surgical History:  Procedure Laterality Date   AV FISTULA PLACEMENT Right 10/15/2021   Procedure: ARTERIOVENOUS (AV) FISTULA  WITH PLACEMENT OF GORE-TEX STRETCH  GRAFT (4-51mmx45cm);  Surgeon: Chuck Hint, MD;  Location: [facility] OR;  Service: Vascular;  Laterality: Right;   CATARACT EXTRACTION     right eye   IR 3D INDEPENDENT WKST  12/14/2017   IR ANGIO INTRA EXTRACRAN SEL COM CAROTID INNOMINATE BILAT MOD SED  09/15/2017   IR ANGIO INTRA EXTRACRAN SEL COM CAROTID INNOMINATE UNI L MOD SED  12/14/2017   IR ANGIO VERTEBRAL SEL VERTEBRAL BILAT MOD SED  09/15/2017   IR RADIOLOGIST EVAL & MGMT  09/10/2017   IR RADIOLOGIST EVAL & MGMT  10/19/2017   lymphatic mass surgery     NASAL TURBINATE REDUCTION     RADIOLOGY WITH ANESTHESIA N/A 12/14/2017   Procedure: RADIOLOGY WITH ANESTHESIA EMBOLIZATION;  Surgeon: Julieanne Cotton, MD;  Location: [facility] OR;  Service: Radiology;  Laterality: N/A;   VIDEO BRONCHOSCOPY Bilateral 11/18/2017   Procedure: VIDEO BRONCHOSCOPY WITHOUT FLUORO;   Surgeon: Leslye Peer, MD;  Location: [facility] ENDOSCOPY;  Service: Cardiopulmonary;  Laterality: Bilateral;   Patient Active Problem List   Diagnosis Date Noted   SNHL (sensory-neural hearing loss), 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   ESRD on dialysis (HCC) 11/20/2022   Mold exposure 08/14/2022   Opioid withdrawal (HCC) 08/14/2022   ILD (interstitial lung disease) (HCC) 08/14/2022   Secondary hyperparathyroidism, renal (HCC) 08/14/2022   Paroxysmal A-fib (HCC) 05/20/2022   Hypertrophy of nasal turbinates 09/05/2021   Chronic kidney disease, stage 5 (HCC)    Aortic atherosclerosis (HCC) 06/24/2021   Chronic rhinitis 10/01/2017   Seasonal allergic conjunctivitis 10/01/2017   Aneurysm (HCC) 09/24/2017   Acute metabolic encephalopathy 09/02/2017   Allergic rhinitis 08/20/2017   Palpitations 07/10/2016   Gout 01/23/2016   History of cataract 02/27/2015   Seasonal and perennial allergic rhinitis 02/15/2012   Chronic diastolic CHF (congestive heart failure) (HCC) 10/11/2011   GERD (gastroesophageal reflux disease)    Type 2 diabetes mellitus without complication, without long-term current use of insulin (HCC) 07/03/2010   Hyperlipidemia 07/03/2010   Essential hypertension 07/03/2010    PCP: Hoy Register, MD  REFERRING PROVIDER: Hoy Register, MD  REFERRING DIAG: muscle spasm  THERAPY DIAG:  Difficulty in walking, not elsewhere classified  Muscle weakness (generalized)  Other low  back pain  Acute pain of right shoulder  Rationale for Evaluation and Treatment: Rehabilitation  ONSET DATE: 01/08/23  SUBJECTIVE:                                                                                                                                                                                                         SUBJECTIVE STATEMENT: "Today Is just my knee, the hips also"  PERTINENT HISTORY:  Per referring physician notes: Cerv muscle spasm, mostly occurs during dialysis-started on  muscle relaxer, B chronic knee pain Recent fall, landed on R knee-ENT consult pending.   PAIN:  Are you having pain? Yes: NPRS scale: 0/10 Pain location: back, hips, L knee neck, R shoulder Pain description: it hurts Aggravating factors: standing  Relieving factors: sit down, Tylenol  PRECAUTIONS: Fall  WEIGHT BEARING RESTRICTIONS: No  FALLS:  Has patient fallen in last 6 months? Yes. Number of falls 1  LIVING ENVIRONMENT: Lives with: lives with their family Lives in: House/apartment Stairs: Yes: External: 14 steps; bilateral but cannot reach both Having difficulty on steps Has following equipment at home: Single point cane and Walker - 2 wheeled  OCCUPATION: N/A  PLOF: Independent  PATIENT GOALS: Be able to stand and move on her own without pain  NEXT MD VISIT: August  OBJECTIVE:   DIAGNOSTIC FINDINGS:  X rays for L knee ordered.  COGNITION: Overall cognitive status: Within functional limits for tasks assessed  SENSATION: Patient reports episode of L fingers tingling during dialysis  POSTURE: rounded shoulders  PALPATION: TTP B piriformis, mildly over glut origins B, mildly in R shoulder, ant, supraspinatus tendon   CERVICAL ROM: moderately limited in B lateral flex with C/O pain in L neck   UPPER EXTREMITY ROM: Shoulder flex limited B due to C/O back pain.   UPPER EXTREMITY MMT: limited testing due to pain in back with resistance at shoulders. B elbows 4/5  CERVICAL SPECIAL TESTS:  Spurling's test: Negative  LUMBAR ROM: moderately limited in all planes due to back pain  LE ROM AND STRENGTH: SLR 60 degrees L, 70 degrees R, Knee to chest on L WNL, but pulls on ant R hip, B ER mildly limited, otherwise ROM WFL. Strength- 4-/5 B, except hip ext, abd 3+/5 FUNCTIONAL TESTS:  5 times sit to stand: 19.44 Functional gait assessment: TBD OWESTRY: 52% disability  TODAY'S TREATMENT:  DATE:  01/29/23 NuStep L5 x 6 min Hamstring Curls 20lb 2x10 Leg Ext 5lb 2x10 S2S 2x10 Standing shoulder flex 2lb WaTE for lumbar 2x10  Standing shoulder Ext red 2x10   01/27/23 NuStep L3 x 5 min 5x S2S  LAQ 2lb 2x10 HS curls red 2x10 Ball squeeze 2x10 Hip Abd green 2x12  Standing marches   01/22/23 Education   PATIENT EDUCATION:  Education details: POC Person educated: Patient Education method: Explanation Education comprehension: verbalized understanding  HOME EXERCISE PROGRAM: TBD  ASSESSMENT:  CLINICAL IMPRESSION: Patient is a 77 y.o. who was seen today for physical therapy treatment for Multiple areas of pain after falling. Dialysis M, W, Sat. Her primary source of pain is her low back. She cannot stand for more than 5 minutes, is having increased difficulty managing steps. Session consisted of light LE and postural strengthening interventions. Cue not to allow LE push against table w/ S2S needed. Cues for core engagement needed with shoulder Ext. Decrease shoulder stiffness reported post session.  OBJECTIVE IMPAIRMENTS: Abnormal gait, decreased activity tolerance, decreased balance, decreased endurance, decreased mobility, difficulty walking, decreased ROM, decreased strength, increased muscle spasms, and pain.   ACTIVITY LIMITATIONS: carrying, lifting, bending, standing, squatting, stairs, and locomotion level  PARTICIPATION LIMITATIONS: meal prep, cleaning, laundry, shopping, and community activity  PERSONAL FACTORS: Past/current experiences are also affecting patient's functional outcome.   REHAB POTENTIAL: Good  CLINICAL DECISION MAKING: Stable/uncomplicated  EVALUATION COMPLEXITY: Moderate   GOALS: Goals reviewed with patient? Yes  SHORT TERM GOALS: Target date: 02/07/23  I with initial HEP Baseline:  Goal status: INITIAL  LONG TERM GOALS: Target date: 04/02/23  I with final HEP Baseline:  Goal status:  INITIAL  2.  Improve Owestry score to < 25% for minimal disability Baseline: 52% Goal status: INITIAL  3.  Patient will demonstrate full, pain free ROM of B shoulders and hips Baseline: Movement causes back pain Goal status: INITIAL  4.  Increase B hip strength to at least 4/5  Baseline: 3+/5 Goal status: INITIAL  5.  Patient will score at least 24/30 on FGA Baseline: TBD Goal status: INITIAL  6.  Patient will safely ambulate up and down at least 12 step using rail and S. Baseline: up to min A on steps Goal status: INITIAL   PLAN:  PT FREQUENCY: 1-2x/week  PT DURATION: 10 weeks  PLANNED INTERVENTIONS: Therapeutic exercises, Therapeutic activity, Neuromuscular re-education, Balance training, Gait training, Patient/Family education, Self Care, Joint mobilization, Stair training, Dry Needling, Electrical stimulation, Spinal mobilization, Cryotherapy, Moist heat, Ionotophoresis 4mg /ml Dexamethasone, and Manual therapy  PLAN FOR NEXT SESSION: Complete 5 x STS, FGA, initiate HEP for stretch and strengthening of Upper and Lower trunk   Iona Beard, DPT 01/29/2023, 2:25 PM

## 2023-01-30 ENCOUNTER — Other Ambulatory Visit (HOSPITAL_COMMUNITY): Payer: Self-pay

## 2023-01-30 DIAGNOSIS — Z992 Dependence on renal dialysis: Secondary | ICD-10-CM | POA: Diagnosis not present

## 2023-01-30 DIAGNOSIS — N186 End stage renal disease: Secondary | ICD-10-CM | POA: Diagnosis not present

## 2023-01-30 DIAGNOSIS — N2581 Secondary hyperparathyroidism of renal origin: Secondary | ICD-10-CM | POA: Diagnosis not present

## 2023-01-30 DIAGNOSIS — L299 Pruritus, unspecified: Secondary | ICD-10-CM | POA: Diagnosis not present

## 2023-02-02 ENCOUNTER — Emergency Department (HOSPITAL_COMMUNITY): Payer: Medicare HMO

## 2023-02-02 ENCOUNTER — Other Ambulatory Visit (HOSPITAL_COMMUNITY): Payer: Self-pay

## 2023-02-02 ENCOUNTER — Encounter (HOSPITAL_COMMUNITY): Payer: Self-pay

## 2023-02-02 ENCOUNTER — Other Ambulatory Visit: Payer: Self-pay

## 2023-02-02 ENCOUNTER — Emergency Department (HOSPITAL_COMMUNITY)
Admission: EM | Admit: 2023-02-02 | Discharge: 2023-02-03 | Disposition: A | Discharge status: Home / Self Care | Payer: Medicare HMO | Attending: Emergency Medicine | Admitting: Emergency Medicine

## 2023-02-02 DIAGNOSIS — Z79899 Other long term (current) drug therapy: Secondary | ICD-10-CM | POA: Diagnosis not present

## 2023-02-02 DIAGNOSIS — R791 Abnormal coagulation profile: Secondary | ICD-10-CM | POA: Insufficient documentation

## 2023-02-02 DIAGNOSIS — R270 Ataxia, unspecified: Secondary | ICD-10-CM | POA: Diagnosis not present

## 2023-02-02 DIAGNOSIS — H81399 Other peripheral vertigo, unspecified ear: Secondary | ICD-10-CM | POA: Insufficient documentation

## 2023-02-02 DIAGNOSIS — I771 Stricture of artery: Secondary | ICD-10-CM | POA: Diagnosis not present

## 2023-02-02 DIAGNOSIS — Z7901 Long term (current) use of anticoagulants: Secondary | ICD-10-CM | POA: Insufficient documentation

## 2023-02-02 DIAGNOSIS — I6782 Cerebral ischemia: Secondary | ICD-10-CM | POA: Diagnosis not present

## 2023-02-02 DIAGNOSIS — Z91010 Allergy to peanuts: Secondary | ICD-10-CM | POA: Diagnosis not present

## 2023-02-02 DIAGNOSIS — M25552 Pain in left hip: Secondary | ICD-10-CM | POA: Diagnosis not present

## 2023-02-02 DIAGNOSIS — S0990XA Unspecified injury of head, initial encounter: Secondary | ICD-10-CM | POA: Diagnosis not present

## 2023-02-02 DIAGNOSIS — N186 End stage renal disease: Secondary | ICD-10-CM | POA: Insufficient documentation

## 2023-02-02 DIAGNOSIS — I5030 Unspecified diastolic (congestive) heart failure: Secondary | ICD-10-CM | POA: Insufficient documentation

## 2023-02-02 DIAGNOSIS — Z7984 Long term (current) use of oral hypoglycemic drugs: Secondary | ICD-10-CM | POA: Diagnosis not present

## 2023-02-02 DIAGNOSIS — D649 Anemia, unspecified: Secondary | ICD-10-CM | POA: Diagnosis not present

## 2023-02-02 DIAGNOSIS — I132 Hypertensive heart and chronic kidney disease with heart failure and with stage 5 chronic kidney disease, or end stage renal disease: Secondary | ICD-10-CM | POA: Diagnosis not present

## 2023-02-02 DIAGNOSIS — E1122 Type 2 diabetes mellitus with diabetic chronic kidney disease: Secondary | ICD-10-CM | POA: Insufficient documentation

## 2023-02-02 DIAGNOSIS — Z992 Dependence on renal dialysis: Secondary | ICD-10-CM | POA: Diagnosis not present

## 2023-02-02 DIAGNOSIS — R42 Dizziness and giddiness: Secondary | ICD-10-CM | POA: Diagnosis not present

## 2023-02-02 DIAGNOSIS — M47816 Spondylosis without myelopathy or radiculopathy, lumbar region: Secondary | ICD-10-CM | POA: Diagnosis not present

## 2023-02-02 DIAGNOSIS — R0602 Shortness of breath: Secondary | ICD-10-CM | POA: Diagnosis not present

## 2023-02-02 DIAGNOSIS — R918 Other nonspecific abnormal finding of lung field: Secondary | ICD-10-CM | POA: Diagnosis not present

## 2023-02-02 DIAGNOSIS — L299 Pruritus, unspecified: Secondary | ICD-10-CM | POA: Diagnosis not present

## 2023-02-02 DIAGNOSIS — E1165 Type 2 diabetes mellitus with hyperglycemia: Secondary | ICD-10-CM | POA: Insufficient documentation

## 2023-02-02 DIAGNOSIS — H8110 Benign paroxysmal vertigo, unspecified ear: Secondary | ICD-10-CM | POA: Diagnosis not present

## 2023-02-02 DIAGNOSIS — I700 Atherosclerosis of aorta: Secondary | ICD-10-CM | POA: Diagnosis not present

## 2023-02-02 DIAGNOSIS — R059 Cough, unspecified: Secondary | ICD-10-CM | POA: Diagnosis not present

## 2023-02-02 DIAGNOSIS — Z8673 Personal history of transient ischemic attack (TIA), and cerebral infarction without residual deficits: Secondary | ICD-10-CM | POA: Diagnosis not present

## 2023-02-02 DIAGNOSIS — N2581 Secondary hyperparathyroidism of renal origin: Secondary | ICD-10-CM | POA: Diagnosis not present

## 2023-02-02 DIAGNOSIS — I10 Essential (primary) hypertension: Secondary | ICD-10-CM | POA: Diagnosis not present

## 2023-02-02 LAB — PROTIME-INR
INR: 1.3 — ABNORMAL HIGH (ref 0.8–1.2)
Prothrombin Time: 16.8 seconds — ABNORMAL HIGH (ref 11.4–15.2)

## 2023-02-02 LAB — COMPREHENSIVE METABOLIC PANEL
ALT: 20 U/L (ref 0–44)
AST: 20 U/L (ref 15–41)
Albumin: 4.1 g/dL (ref 3.5–5.0)
Alkaline Phosphatase: 140 U/L — ABNORMAL HIGH (ref 38–126)
Anion gap: 13 (ref 5–15)
BUN: 35 mg/dL — ABNORMAL HIGH (ref 8–23)
CO2: 32 mmol/L (ref 22–32)
Calcium: 8.9 mg/dL (ref 8.9–10.3)
Chloride: 88 mmol/L — ABNORMAL LOW (ref 98–111)
Creatinine, Ser: 4.68 mg/dL — ABNORMAL HIGH (ref 0.44–1.00)
GFR, Estimated: 9 mL/min — ABNORMAL LOW (ref >60)
Glucose, Bld: 204 mg/dL — ABNORMAL HIGH (ref 70–99)
Potassium: 3.9 mmol/L (ref 3.5–5.1)
Sodium: 133 mmol/L — ABNORMAL LOW (ref 135–145)
Total Bilirubin: 0.7 mg/dL (ref 0.3–1.2)
Total Protein: 8.1 g/dL (ref 6.5–8.1)

## 2023-02-02 LAB — DIFFERENTIAL
Abs Immature Granulocytes: 0.03 10*3/uL (ref 0.00–0.07)
Basophils Absolute: 0 10*3/uL (ref 0.0–0.1)
Basophils Relative: 0 %
Eosinophils Absolute: 0.3 10*3/uL (ref 0.0–0.5)
Eosinophils Relative: 3 %
Immature Granulocytes: 0 %
Lymphocytes Relative: 14 %
Lymphs Abs: 1.1 10*3/uL (ref 0.7–4.0)
Monocytes Absolute: 0.5 10*3/uL (ref 0.1–1.0)
Monocytes Relative: 6 %
Neutro Abs: 6.3 10*3/uL (ref 1.7–7.7)
Neutrophils Relative %: 77 %

## 2023-02-02 LAB — CBC
HCT: 32 % — ABNORMAL LOW (ref 36.0–46.0)
Hemoglobin: 11 g/dL — ABNORMAL LOW (ref 12.0–15.0)
MCH: 31.1 pg (ref 26.0–34.0)
MCHC: 34.4 g/dL (ref 30.0–36.0)
MCV: 90.4 fL (ref 80.0–100.0)
Platelets: 177 10*3/uL (ref 150–400)
RBC: 3.54 MIL/uL — ABNORMAL LOW (ref 3.87–5.11)
RDW: 16.1 % — ABNORMAL HIGH (ref 11.5–15.5)
WBC: 8.3 10*3/uL (ref 4.0–10.5)
nRBC: 0 % (ref 0.0–0.2)

## 2023-02-02 LAB — I-STAT CHEM 8, ED
BUN: 40 mg/dL — ABNORMAL HIGH (ref 8–23)
Calcium, Ion: 0.94 mmol/L — ABNORMAL LOW (ref 1.15–1.40)
Chloride: 91 mmol/L — ABNORMAL LOW (ref 98–111)
Creatinine, Ser: 5 mg/dL — ABNORMAL HIGH (ref 0.44–1.00)
Glucose, Bld: 198 mg/dL — ABNORMAL HIGH (ref 70–99)
HCT: 34 % — ABNORMAL LOW (ref 36.0–46.0)
Hemoglobin: 11.6 g/dL — ABNORMAL LOW (ref 12.0–15.0)
Potassium: 4 mmol/L (ref 3.5–5.1)
Sodium: 134 mmol/L — ABNORMAL LOW (ref 135–145)
TCO2: 36 mmol/L — ABNORMAL HIGH (ref 22–32)

## 2023-02-02 LAB — CBG MONITORING, ED: Glucose-Capillary: 189 mg/dL — ABNORMAL HIGH (ref 70–99)

## 2023-02-02 LAB — ETHANOL: Alcohol, Ethyl (B): <10 mg/dL (ref <10)

## 2023-02-02 LAB — APTT: aPTT: 27 seconds (ref 24–36)

## 2023-02-02 LAB — TROPONIN I (HIGH SENSITIVITY): Troponin I (High Sensitivity): 24 ng/L — ABNORMAL HIGH (ref <18)

## 2023-02-02 MED ORDER — MECLIZINE HCL 25 MG PO TABS
25.0000 mg | ORAL_TABLET | Freq: Once | ORAL | Status: AC
  Administered 2023-02-03: 25 mg via ORAL
  Filled 2023-02-02: qty 1

## 2023-02-02 MED ORDER — SODIUM CHLORIDE 0.9% FLUSH: 3.0000 mL | Freq: Once | INTRAVENOUS | Status: DC

## 2023-02-02 NOTE — ED Triage Notes (Addendum)
Pt arrived from home via [facility] c/o dizziness x 3 days and cough with green sputum. NIH 4

## 2023-02-02 NOTE — ED Provider Notes (Incomplete)
[place] EMERGENCY DEPARTMENT AT [facility] Provider Note   CSN: [id number] Arrival date & time: 02/02/23  2051     History {Add pertinent medical, surgical, social history, OB history to HPI:1} Chief Complaint  Patient presents with  . Dizziness    Paula [name] is a 77 y.o. female.  HPI   Patient with medical history including diabetes, hypertension, end-stage renal disease on dialysis Monday Wednesday Friday, PVD currently on Eliquis, PCA aneurysm status post stent, diastolic heart failure presenting with complaints of dizziness.  Patient states that dizziness has been going on for about a week's time, states is positional, she goes mainly from laying down to standing, will last about a minute and then resolved, describes it as the room is spinning, does not typically associate with the headaches no change in vision no new paresthesias or weakness in the upper and/or lower extremities.  She does note that she has a slight headache earlier today but this has since resolved, it was not the worst headache of her life, no recent head trauma.  She does note that she feels slightly off balance, but she states that she has been feeling off balance for the last 2 weeks, after she had a fall and now has left-sided hip pain and knee pain, states she is seeing physical therapy for this.  States that her off-balance does not feel any worse than usual.  Denies any neck pain, no associated fevers chills, general body aches.    Home Medications Prior to Admission medications   Medication Sig Start Date End Date Taking? Authorizing Provider  acetaminophen (TYLENOL) 500 MG tablet Take 500 mg by mouth every 6 (six) hours as needed for fever, moderate pain or mild pain.    [provider]  albuterol (PROAIR HFA) 108 (90 Base) MCG/ACT inhaler Inhale 1-2 puffs into the lungs every 6 (six) hours as needed for wheezing or shortness of breath. 05/06/22   Hoy Register, MD   albuterol (PROVENTIL) (2.5 MG/3ML) 0.083% nebulizer solution Take 3 mLs (1 vial) by nebulization every 6 hours as needed for wheezing or shortness of breath. 01/07/22   Hoy Register, MD  amLODipine (NORVASC) 5 MG tablet Take 1 tablet (5 mg total) by mouth daily. 01/07/23   Hoy Register, MD  apixaban (ELIQUIS) 5 MG TABS tablet Take 1 tablet (5 mg total) by mouth 2 (two) times daily. 11/25/22   Hoy Register, MD  atorvastatin (LIPITOR) 80 MG tablet Take 1 tablet (80 mg total) by mouth daily. 01/07/23   Hoy Register, MD  azelastine (ASTELIN) 0.1 % nasal spray Place 2 sprays into both nostrils 2 (two) times daily. Use in each nostril as directed 01/08/23   Hoy Register, MD  benzonatate (TESSALON) 100 MG capsule Take 1 capsule (100 mg total) by mouth 2 (two) times daily as needed for cough. 01/08/23   Hoy Register, MD  calcium acetate (PHOSLO) 667 MG capsule Take 1 capsule by mouth 3 times daily 10/31/21   Penninger, Lillia Abed, PA  calcium acetate (PHOSLO) 667 MG capsule Take 1 capsule (667 mg total) by mouth 3 (three) times daily with meals. 01/14/23     carvedilol (COREG) 12.5 MG tablet Take 1 tablet (12.5 mg total) by mouth 2 (two) times daily with a meal. (Take once a day in the evening on hemodialysis days) 10/27/22   Hoy Register, MD  diclofenac Sodium (VOLTAREN) 1 % GEL Apply 4 g topically 4 (four) times daily. 01/08/23   Hoy Register, MD  ethyl chloride spray Apply a small amount on skin immediately before cannulation 01/09/22   Dagoberto Ligas, MD  fluticasone [facility]) 50 MCG/ACT nasal spray Place 2 sprays into both nostrils daily. 01/08/23   Hoy Register, MD  gabapentin (NEURONTIN) 100 MG capsule Take 1 capsule (100 mg total) by mouth 3 (three) times daily. 11/25/22   Hoy Register, MD  glipiZIDE (GLUCOTROL XL) 2.5 MG 24 hr tablet Take 1 tablet (2.5 mg total) by mouth daily with breakfast. (needs office visit) 01/07/23   Hoy Register, MD  glucose blood (ACCU-CHEK GUIDE) test strip USE  AS INSTRUCTED 2 TIMES DAILY 03/12/22   Hoy Register, MD  Homeopathic Products (EARACHE DROPS OT) Place 1 drop into both ears daily.    [provider]  isosorbide mononitrate (IMDUR) 60 MG 24 hr tablet Take 1 tablet (60 mg total) by mouth daily. 01/07/23   Hoy Register, MD  lidocaine-prilocaine (EMLA) cream Apply a small amount to dialysis access one hour prior to dialysis. Wrap with plastic wrap. 12/10/21     loratadine (CLARITIN) 10 MG tablet Take 1 tablet (10 mg total) by mouth daily. 01/07/23   Hoy Register, MD  Misc. Devices [employer]  Diagnosis - unstable gait 03/05/21   Hoy Register, MD  Misc. Devices MISC Fistula sleeve for R arm.  Diagnoses: End-stage renal disease on hemodialysis 01/09/22   Hoy Register, MD  Misc. Devices MISC 1. Incontinence supplies.  2. Briefs.  Diagnosis-urinary incontinence 04/17/22   Hoy Register, MD  Misc. Devices MISC Nepro dialysis protein drink.  Diagnosis end-stage renal disease 05/16/22   Hoy Register, MD  Misc. Devices MISC Blood pressure monitor.  Diagnosis hypertension 05/20/22   Hoy Register, MD  olopatadine (PATANOL) 0.1 % ophthalmic solution Place 1 drop into both eyes 2 (two) times daily. 08/14/22   Storm Frisk, MD  Polyethyl Glycol-Propyl Glycol (SYSTANE OP) Place 1 drop into both eyes daily.    [provider]  tiZANidine (ZANAFLEX) 4 MG tablet Take 1 tablet (4 mg total) by mouth every other day. As needed before hemodialysis sessions for muscle spasms 01/08/23   Hoy Register, MD  traZODone (DESYREL) 50 MG tablet Take 1 tablet (50 mg total) by mouth at bedtime as needed for sleep. 09/09/22   Hoy Register, MD  sodium chloride (OCEAN) 0.65 % SOLN nasal spray Place 2 sprays into both nostrils as needed for congestion. Patient not taking: No sig reported 03/21/19 02/21/21  Glenford Bayley, NP      Allergies    Dust mite extract, Peanut-containing drug products, Ace inhibitors, and Lovastatin    Review of  Systems   Review of Systems  Constitutional:  Negative for chills and fever.  Respiratory:  Negative for shortness of breath.   Cardiovascular:  Negative for chest pain.  Gastrointestinal:  Negative for abdominal pain.  Neurological:  Positive for dizziness. Negative for headaches.    Physical Exam Updated Vital Signs BP (!) 160/66 (BP Location: Left Arm)   Pulse 75   Temp (!) 97.5 F (36.4 C) (Oral)   Resp 18   Ht 5\' 2"  (1.575 m)   Wt 72 kg   LMP  (LMP Unknown)   SpO2 97%   BMI 29.03 kg/m  Physical Exam Vitals and nursing note reviewed.  Constitutional:      General: She is not in acute distress.    Appearance: She is not ill-appearing.  HENT:     Head: Normocephalic and atraumatic.     Nose: No  congestion.  Eyes:     Conjunctiva/sclera: Conjunctivae normal.  Cardiovascular:     Rate and Rhythm: Normal rate and regular rhythm.     Pulses: Normal pulses.     Heart sounds: No murmur heard.    No friction rub. No gallop.  Pulmonary:     Effort: No respiratory distress.     Breath sounds: No wheezing, rhonchi or rales.  Abdominal:     Palpations: Abdomen is soft.     Tenderness: There is no abdominal tenderness. There is no right CVA tenderness or left CVA tenderness.  Skin:    General: Skin is warm and dry.     Comments: Right sided AC fistula good palpable thrill no evidence of infection present.  Neurological:     Mental Status: She is alert.     GCS: GCS eye subscore is 4. GCS verbal subscore is 5. GCS motor subscore is 6.     Cranial Nerves: Cranial nerves 2-12 are intact. No cranial nerve deficit.     Sensory: Sensation is intact.     Motor: No weakness.     Coordination: Romberg sign negative. Finger-Nose-Finger Test normal.     Comments: No facial asymmetry no difficulty with word finding following two-step commands there is no unilateral weakness present.  Patient is able to stand, but is unable to take steps as she feels off balance due to her left hip.   Patient typically ambulates with a walker.  Psychiatric:        Mood and Affect: Mood normal.     ED Results / Procedures / Treatments   Labs (all labs ordered are listed, but only abnormal results are displayed) Labs Reviewed  PROTIME-INR - Abnormal; Notable for the following components:      Result Value   Prothrombin Time 16.8 (*)    INR 1.3 (*)    All other components within normal limits  CBC - Abnormal; Notable for the following components:   RBC 3.54 (*)    Hemoglobin 11.0 (*)    HCT 32.0 (*)    RDW 16.1 (*)    All other components within normal limits  COMPREHENSIVE METABOLIC PANEL - Abnormal; Notable for the following components:   Sodium 133 (*)    Chloride 88 (*)    Glucose, Bld 204 (*)    BUN 35 (*)    Creatinine, Ser 4.68 (*)    Alkaline Phosphatase 140 (*)    GFR, Estimated 9 (*)    All other components within normal limits  I-STAT CHEM 8, ED - Abnormal; Notable for the following components:   Sodium 134 (*)    Chloride 91 (*)    BUN 40 (*)    Creatinine, Ser 5.00 (*)    Glucose, Bld 198 (*)    Calcium, Ion 0.94 (*)    TCO2 36 (*)    Hemoglobin 11.6 (*)    HCT 34.0 (*)    All other components within normal limits  CBG MONITORING, ED - Abnormal; Notable for the following components:   Glucose-Capillary 189 (*)    All other components within normal limits  TROPONIN I (HIGH SENSITIVITY) - Abnormal; Notable for the following components:   Troponin I (High Sensitivity) 24 (*)    All other components within normal limits  APTT  DIFFERENTIAL  ETHANOL    EKG EKG Interpretation  Date/Time:  Monday February 02 2023 21:15:00 EDT Ventricular Rate:  70 PR Interval:  256 QRS Duration: 90 QT Interval:  434 QTC Calculation: 468 R Axis:   -36 Text Interpretation: Sinus rhythm with 1st degree A-V block with Premature atrial complexes Left axis deviation Minimal voltage criteria for LVH, may be normal variant ( Cornell product )  similar to Aug 2023 Confirmed by  Pricilla Loveless [phone]) on 02/02/2023 10:15:36 PM  Radiology CT HEAD WO CONTRAST  Result Date: 02/02/2023 CLINICAL DATA:  Ataxia, head trauma. EXAM: CT HEAD WITHOUT CONTRAST TECHNIQUE: Contiguous axial images were obtained from the base of the skull through the vertex without intravenous contrast. RADIATION DOSE REDUCTION: This exam was performed according to the departmental dose-optimization program which includes automated exposure control, adjustment of the mA and/or kV according to patient size and/or use of iterative reconstruction technique. COMPARISON:  CT head MRI examination dated July, 2022 FINDINGS: Brain: No evidence of acute infarction, hemorrhage, hydrocephalus, extra-axial collection or mass lesion/mass effect. Patchy areas of low-attenuation of the periventricular white matter presumed chronic microvascular ischemic changes Vascular: No hyperdense vessel or unexpected calcification. Skull: Normal. Negative for fracture or focal lesion. Sinuses/Orbits: No acute finding. Other: None. IMPRESSION: 1. No acute intracranial abnormality. 2. Chronic microvascular ischemic changes of the white matter. Electronically Signed   By: Larose Hires D.O.   On: 02/02/2023 23:18   DG Chest Portable 1 View  Result Date: 02/02/2023 CLINICAL DATA:  sob EXAM: PORTABLE CHEST 1 VIEW COMPARISON:  April 02, 2022 FINDINGS: The cardiomediastinal silhouette is unchanged and enlarged in contour.Atherosclerotic calcifications of the tortuous thoracic aorta. No pleural effusion. No pneumothorax. Scattered LEFT-sided linear opacities consistent with atelectasis. No acute pleuroparenchymal abnormality. IMPRESSION: Scattered LEFT-sided linear opacities consistent with atelectasis. Electronically Signed   By: Meda Klinefelter M.D.   On: 02/02/2023 21:59    Procedures Procedures  {Document cardiac monitor, telemetry assessment procedure when appropriate:1}  Medications Ordered in ED Medications  sodium chloride flush  (NS) 0.9 % injection 3 mL (has no administration in time range)  meclizine (ANTIVERT) tablet 25 mg (has no administration in time range)    ED Course/ Medical Decision Making/ A&P   {   Click here for ABCD2, HEART and other calculatorsREFRESH Note before signing :1}                          Medical Decision Making Amount and/or Complexity of Data Reviewed Labs: ordered. Radiology: ordered.   ***  {Document critical care time when appropriate:1} {Document review of labs and clinical decision tools ie heart score, Chads2Vasc2 etc:1}  {Document your independent review of radiology images, and any outside records:1} {Document your discussion with family members, caretakers, and with consultants:1} {Document social determinants of health affecting pt's care:1} {Document your decision making why or why not admission, treatments were needed:1} Final Clinical Impression(s) / ED Diagnoses Final diagnoses:  None    Rx / DC Orders ED Discharge Orders     None

## 2023-02-02 NOTE — ED Provider Notes (Signed)
[place] EMERGENCY DEPARTMENT AT [facility] Provider Note   CSN: [id number] Arrival date & time: 02/02/23  2051     History  Chief Complaint  Patient presents with   Dizziness    Paula [name] is a 77 y.o. female.  HPI   Patient with medical history including diabetes, hypertension, end-stage renal disease on dialysis Monday Wednesday Friday, PVD currently on Eliquis, PCA aneurysm status post stent, diastolic heart failure presenting with complaints of dizziness.  Patient states that dizziness has been going on for about a week's time, states is positional, she goes mainly from laying down to standing, will last about a minute and then resolved, describes it as the room is spinning, does not typically associate with the headaches no change in vision no new paresthesias or weakness in the upper and/or lower extremities.  She does note that she has a slight headache earlier today but this has since resolved, it was not the worst headache of her life, no recent head trauma.  She does note that she feels slightly off balance, but she states that she has been feeling off balance for the last 2 weeks, after she had a fall and now has left-sided hip pain and knee pain, states she is seeing physical therapy for this.  States that her off-balance does not feel any worse than usual.  Denies any neck pain, no associated fevers chills, general body aches.    Home Medications Prior to Admission medications   Medication Sig Start Date End Date Taking? Authorizing Provider  acetaminophen (TYLENOL) 500 MG tablet Take 500 mg by mouth every 6 (six) hours as needed for fever, moderate pain or mild pain.    [provider]  albuterol (PROAIR HFA) 108 (90 Base) MCG/ACT inhaler Inhale 1-2 puffs into the lungs every 6 (six) hours as needed for wheezing or shortness of breath. 05/06/22   Hoy Register, MD  albuterol (PROVENTIL) (2.5 MG/3ML) 0.083% nebulizer solution Take 3 mLs (1  vial) by nebulization every 6 hours as needed for wheezing or shortness of breath. 01/07/22   Hoy Register, MD  amLODipine (NORVASC) 5 MG tablet Take 1 tablet (5 mg total) by mouth daily. 01/07/23   Hoy Register, MD  apixaban (ELIQUIS) 5 MG TABS tablet Take 1 tablet (5 mg total) by mouth 2 (two) times daily. 11/25/22   Hoy Register, MD  atorvastatin (LIPITOR) 80 MG tablet Take 1 tablet (80 mg total) by mouth daily. 01/07/23   Hoy Register, MD  azelastine (ASTELIN) 0.1 % nasal spray Place 2 sprays into both nostrils 2 (two) times daily. Use in each nostril as directed 01/08/23   Hoy Register, MD  benzonatate (TESSALON) 100 MG capsule Take 1 capsule (100 mg total) by mouth 2 (two) times daily as needed for cough. 01/08/23   Hoy Register, MD  calcium acetate (PHOSLO) 667 MG capsule Take 1 capsule by mouth 3 times daily 10/31/21   Penninger, Lillia Abed, PA  calcium acetate (PHOSLO) 667 MG capsule Take 1 capsule (667 mg total) by mouth 3 (three) times daily with meals. 01/14/23     carvedilol (COREG) 12.5 MG tablet Take 1 tablet (12.5 mg total) by mouth 2 (two) times daily with a meal. (Take once a day in the evening on hemodialysis days) 10/27/22   Hoy Register, MD  diclofenac Sodium (VOLTAREN) 1 % GEL Apply 4 g topically 4 (four) times daily. 01/08/23   Hoy Register, MD  ethyl chloride spray Apply a small amount on skin  immediately before cannulation 01/09/22   Dagoberto Ligas, MD  fluticasone [facility]) 50 MCG/ACT nasal spray Place 2 sprays into both nostrils daily. 01/08/23   Hoy Register, MD  gabapentin (NEURONTIN) 100 MG capsule Take 1 capsule (100 mg total) by mouth 3 (three) times daily. 11/25/22   Hoy Register, MD  glipiZIDE (GLUCOTROL XL) 2.5 MG 24 hr tablet Take 1 tablet (2.5 mg total) by mouth daily with breakfast. (needs office visit) 01/07/23   Hoy Register, MD  glucose blood (ACCU-CHEK GUIDE) test strip USE AS INSTRUCTED 2 TIMES DAILY 03/12/22   Hoy Register, MD  Homeopathic  Products (EARACHE DROPS OT) Place 1 drop into both ears daily.    [provider]  isosorbide mononitrate (IMDUR) 60 MG 24 hr tablet Take 1 tablet (60 mg total) by mouth daily. 01/07/23   Hoy Register, MD  lidocaine-prilocaine (EMLA) cream Apply a small amount to dialysis access one hour prior to dialysis. Wrap with plastic wrap. 12/10/21     loratadine (CLARITIN) 10 MG tablet Take 1 tablet (10 mg total) by mouth daily. 01/07/23   Hoy Register, MD  meclizine (ANTIVERT) 12.5 MG tablet Take 1 tablet (12.5 mg total) by mouth 3 (three) times daily as needed for dizziness. 02/03/23  Yes Carroll Sage, PA-C  Misc. Devices [employer]  Diagnosis - unstable gait 03/05/21   Hoy Register, MD  Misc. Devices MISC Fistula sleeve for R arm.  Diagnoses: End-stage renal disease on hemodialysis 01/09/22   Hoy Register, MD  Misc. Devices MISC 1. Incontinence supplies.  2. Briefs.  Diagnosis-urinary incontinence 04/17/22   Hoy Register, MD  Misc. Devices MISC Nepro dialysis protein drink.  Diagnosis end-stage renal disease 05/16/22   Hoy Register, MD  Misc. Devices MISC Blood pressure monitor.  Diagnosis hypertension 05/20/22   Hoy Register, MD  olopatadine (PATANOL) 0.1 % ophthalmic solution Place 1 drop into both eyes 2 (two) times daily. 08/14/22   Storm Frisk, MD  Polyethyl Glycol-Propyl Glycol (SYSTANE OP) Place 1 drop into both eyes daily.    [provider]  tiZANidine (ZANAFLEX) 4 MG tablet Take 1 tablet (4 mg total) by mouth every other day. As needed before hemodialysis sessions for muscle spasms 01/08/23   Hoy Register, MD  traZODone (DESYREL) 50 MG tablet Take 1 tablet (50 mg total) by mouth at bedtime as needed for sleep. 09/09/22   Hoy Register, MD  sodium chloride (OCEAN) 0.65 % SOLN nasal spray Place 2 sprays into both nostrils as needed for congestion. Patient not taking: No sig reported 03/21/19 02/21/21  Glenford Bayley, NP      Allergies    Dust mite  extract, Peanut-containing drug products, Ace inhibitors, and Lovastatin    Review of Systems   Review of Systems  Constitutional:  Negative for chills and fever.  Respiratory:  Negative for shortness of breath.   Cardiovascular:  Negative for chest pain.  Gastrointestinal:  Negative for abdominal pain.  Neurological:  Positive for dizziness. Negative for headaches.    Physical Exam Updated Vital Signs BP (!) 176/70 (BP Location: Right Arm)   Pulse (!) 55   Temp 97.7 F (36.5 C) (Oral)   Resp 18   Ht 5\' 2"  (1.575 m)   Wt 72 kg   LMP  (LMP Unknown)   SpO2 100%   BMI 29.03 kg/m  Physical Exam Vitals and nursing note reviewed.  Constitutional:      General: She is not in acute distress.    Appearance:  She is not ill-appearing.  HENT:     Head: Normocephalic and atraumatic.     Nose: No congestion.  Eyes:     Conjunctiva/sclera: Conjunctivae normal.  Cardiovascular:     Rate and Rhythm: Normal rate and regular rhythm.     Pulses: Normal pulses.     Heart sounds: No murmur heard.    No friction rub. No gallop.  Pulmonary:     Effort: No respiratory distress.     Breath sounds: No wheezing, rhonchi or rales.  Abdominal:     Palpations: Abdomen is soft.     Tenderness: There is no abdominal tenderness. There is no right CVA tenderness or left CVA tenderness.  Skin:    General: Skin is warm and dry.     Comments: Right sided AC fistula good palpable thrill no evidence of infection present.  Neurological:     Mental Status: She is alert.     GCS: GCS eye subscore is 4. GCS verbal subscore is 5. GCS motor subscore is 6.     Cranial Nerves: Cranial nerves 2-12 are intact. No cranial nerve deficit.     Sensory: Sensation is intact.     Motor: No weakness.     Coordination: Romberg sign negative. Finger-Nose-Finger Test normal.     Comments: No facial asymmetry no difficulty with word finding following two-step commands there is no unilateral weakness present.  Patient is  able to stand, but is unable to take steps as she feels off balance due to her left hip.  Patient typically ambulates with a walker.  Psychiatric:        Mood and Affect: Mood normal.     ED Results / Procedures / Treatments   Labs (all labs ordered are listed, but only abnormal results are displayed) Labs Reviewed  PROTIME-INR - Abnormal; Notable for the following components:      Result Value   Prothrombin Time 16.8 (*)    INR 1.3 (*)    All other components within normal limits  CBC - Abnormal; Notable for the following components:   RBC 3.54 (*)    Hemoglobin 11.0 (*)    HCT 32.0 (*)    RDW 16.1 (*)    All other components within normal limits  COMPREHENSIVE METABOLIC PANEL - Abnormal; Notable for the following components:   Sodium 133 (*)    Chloride 88 (*)    Glucose, Bld 204 (*)    BUN 35 (*)    Creatinine, Ser 4.68 (*)    Alkaline Phosphatase 140 (*)    GFR, Estimated 9 (*)    All other components within normal limits  I-STAT CHEM 8, ED - Abnormal; Notable for the following components:   Sodium 134 (*)    Chloride 91 (*)    BUN 40 (*)    Creatinine, Ser 5.00 (*)    Glucose, Bld 198 (*)    Calcium, Ion 0.94 (*)    TCO2 36 (*)    Hemoglobin 11.6 (*)    HCT 34.0 (*)    All other components within normal limits  CBG MONITORING, ED - Abnormal; Notable for the following components:   Glucose-Capillary 189 (*)    All other components within normal limits  TROPONIN I (HIGH SENSITIVITY) - Abnormal; Notable for the following components:   Troponin I (High Sensitivity) 24 (*)    All other components within normal limits  APTT  DIFFERENTIAL  ETHANOL    EKG EKG Interpretation  Date/Time:  Monday February 02 2023 21:15:00 EDT Ventricular Rate:  70 PR Interval:  256 QRS Duration: 90 QT Interval:  434 QTC Calculation: 468 R Axis:   -36 Text Interpretation: Sinus rhythm with 1st degree A-V block with Premature atrial complexes Left axis deviation Minimal voltage  criteria for LVH, may be normal variant ( Cornell product )  similar to Aug 2023 Confirmed by Pricilla Loveless [phone]) on 02/02/2023 10:15:36 PM  Radiology MR BRAIN WO CONTRAST  Result Date: 02/03/2023 CLINICAL DATA:  Initial evaluation for TIA. EXAM: MRI HEAD WITHOUT CONTRAST TECHNIQUE: Multiplanar, multiecho pulse sequences of the brain and surrounding structures were obtained without intravenous contrast. COMPARISON:  Prior CT from 02/02/2023. FINDINGS: Brain: Cerebral volume within normal limits. Scattered patchy T2/FLAIR hyperintensity involving the periventricular deep white matter both cerebral hemispheres, consistent chronic small vessel ischemic disease, mild in nature. Few small remote lacunar infarcts present about the bilateral thalami and pons. No abnormal restricted diffusion to suggest acute or subacute ischemia. Gray-white matter differentiation maintained. No areas of chronic cortical infarction. No acute intracranial hemorrhage. Few small chronic micro hemorrhages noted about thalami, likely hypertensive in nature. No mass lesion, midline shift or mass effect. No hydrocephalus or extra-axial fluid collection. Pituitary gland suprasellar region within normal limits. Vascular: Major intracranial vascular flow voids are maintained. Skull and upper cervical spine: Craniocervical junction within normal limits. Diffuse loss of normal bone marrow signal, nonspecific but can be seen with anemia, smoking, obesity, and infiltrative/myelofibrotic marrow processes. No focal marrow replacing lesion. No scalp soft tissue abnormality. Sinuses/Orbits: Prior bilateral ocular lens replacement. Mild scattered mucosal thickening about the paranasal sinuses. No significant mastoid effusion. Other: None. IMPRESSION: 1. No acute intracranial abnormality. 2. Mild chronic microvascular ischemic disease, with a few small remote lacunar infarcts about the bilateral thalami and pons. Electronically Signed   By: Rise Mu M.D.   On: 02/03/2023 03:08   DG Hip Unilat W or Wo Pelvis 2-3 Views Left  Result Date: 02/03/2023 CLINICAL DATA:  Left hip pain after fall EXAM: DG HIP (WITH OR WITHOUT PELVIS) 2-3V LEFT COMPARISON:  Radiographs 11/11/2021 FINDINGS: No acute fracture or dislocation. Degenerative changes pubic symphysis, both hips, SI joints and lower lumbar spine. IMPRESSION: No acute fracture or dislocation. Electronically Signed   By: Minerva Fester M.D.   On: 02/03/2023 00:46   CT HEAD WO CONTRAST  Result Date: 02/02/2023 CLINICAL DATA:  Ataxia, head trauma. EXAM: CT HEAD WITHOUT CONTRAST TECHNIQUE: Contiguous axial images were obtained from the base of the skull through the vertex without intravenous contrast. RADIATION DOSE REDUCTION: This exam was performed according to the departmental dose-optimization program which includes automated exposure control, adjustment of the mA and/or kV according to patient size and/or use of iterative reconstruction technique. COMPARISON:  CT head MRI examination dated July, 2022 FINDINGS: Brain: No evidence of acute infarction, hemorrhage, hydrocephalus, extra-axial collection or mass lesion/mass effect. Patchy areas of low-attenuation of the periventricular white matter presumed chronic microvascular ischemic changes Vascular: No hyperdense vessel or unexpected calcification. Skull: Normal. Negative for fracture or focal lesion. Sinuses/Orbits: No acute finding. Other: None. IMPRESSION: 1. No acute intracranial abnormality. 2. Chronic microvascular ischemic changes of the white matter. Electronically Signed   By: Larose Hires D.O.   On: 02/02/2023 23:18   DG Chest Portable 1 View  Result Date: 02/02/2023 CLINICAL DATA:  sob EXAM: PORTABLE CHEST 1 VIEW COMPARISON:  April 02, 2022 FINDINGS: The cardiomediastinal silhouette is unchanged and enlarged in contour.Atherosclerotic calcifications of the tortuous thoracic aorta.  No pleural effusion. No pneumothorax. Scattered  LEFT-sided linear opacities consistent with atelectasis. No acute pleuroparenchymal abnormality. IMPRESSION: Scattered LEFT-sided linear opacities consistent with atelectasis. Electronically Signed   By: Meda Klinefelter M.D.   On: 02/02/2023 21:59    Procedures Procedures    Medications Ordered in ED Medications  sodium chloride flush (NS) 0.9 % injection 3 mL (has no administration in time range)  meclizine (ANTIVERT) tablet 25 mg (25 mg Oral Given 02/03/23 0123)    ED Course/ Medical Decision Making/ A&P                             Medical Decision Making Amount and/or Complexity of Data Reviewed Labs: ordered. Radiology: ordered.   This patient presents to the ED for concern of dizziness, this involves an extensive number of treatment options, and is a complaint that carries with it a high risk of complications and morbidity.  The differential diagnosis includes vertigo, orthostatics, CVA, cranial mass, intracranial bleed, metabolic abnormality    Additional history obtained:  Additional history obtained from N/A External records from outside source obtained and reviewed including vascular notes, neurology notes,   Co morbidities that complicate the patient evaluation  End-stage renal disease, aneurysm, CHF,  Social Determinants of Health:  N/A    Lab Tests:  I Ordered, and personally interpreted labs.  The pertinent results include: CBC shows normocytic anemia hemoglobin 11, CMP reveals sodium 133, chloride 88, glucose 204, BUN 35, creatinine 4, GFR 9, prothrombin time 16.8 INR 1.3 ethanol less than 10 first troponin is 24   Imaging Studies ordered:  I ordered imaging studies including chest x-ray, CT head, MRI brain I independently visualized and interpreted imaging which showed chest x-ray, CT head negative acute findings, MRI negative for acute findings. I agree with the radiologist interpretation   Cardiac Monitoring:  The patient was maintained on a  cardiac monitor.  I personally viewed and interpreted the cardiac monitored which showed an underlying rhythm of: Without signs of ischemia   Medicines ordered and prescription drug management:  I ordered medication including meclizine I have reviewed the patients home medicines and have made adjustments as needed  Critical Interventions:  N/A   Reevaluation:  Presents with dizziness, triage obtain basic lab workup imaging which I personally reviewed, grossly unremarkable, on my exam dizziness is mainly positional, she does seem slightly off balance but does endorse that she is having left hip pain, will provide meclizine, and further assess with MRI.  Will also further follow-up with x-ray of the left hip.  X-ray of hip was unremarkable, MRI was unremarkable for acute findings, patient was reassessed she was found laying in bed, resting comfortably, updated on lab work and imaging, patient has no complaints and is agreeable for discharge at this time.   Consultations Obtained:  N/A    Test Considered:  CT angio head-deferred by suspicion for carotid or vertebral dissection is low at this time presentation atypical she does not endorse any sort of neck pain or occipital pain, dizziness is positional and resolves.    Rule out low suspicion for internal head bleed and or mass as CT imaging is negative for acute findings.  Low suspicion for CVA she has no focal deficit present my exam, MRI is also negative for acute findings..  Low suspicion for dissection of the vertebral or carotid artery as presentation atypical of etiology.  Low suspicion for meningitis as she has no meningeal  sign present.  I doubt ACS not endorsing any chest pain there is no ST elevation seen on EKG, she does have a slightly elevated troponin but this appears to be at her baseline my suspicion is likely poor clearance.      Dispostion and problem list  After consideration of the diagnostic results and the  patients response to treatment, I feel that the patent would benefit from discharge.  Positional vertigo-presentation is consistent with positional vertigo, she also has slightly low sodium which possibly could be contributing, will recommend that she goes slowly from sitting to standing, recommend that she stay hydrated, will have her follow-up with neurology for further assessment.            Final Clinical Impression(s) / ED Diagnoses Final diagnoses:  Peripheral positional vertigo, unspecified laterality    Rx / DC Orders ED Discharge Orders          Ordered    meclizine (ANTIVERT) 12.5 MG tablet  3 times daily PRN        02/03/23 0413              Carroll Sage, PA-C 02/03/23 0423    Nira Conn, MD 02/04/23 [phone]

## 2023-02-03 ENCOUNTER — Emergency Department (HOSPITAL_COMMUNITY): Payer: Medicare HMO

## 2023-02-03 ENCOUNTER — Other Ambulatory Visit (HOSPITAL_COMMUNITY): Payer: Self-pay

## 2023-02-03 ENCOUNTER — Ambulatory Visit: Payer: Medicare HMO | Admitting: Physical Therapy

## 2023-02-03 ENCOUNTER — Other Ambulatory Visit: Payer: Self-pay

## 2023-02-03 DIAGNOSIS — H81399 Other peripheral vertigo, unspecified ear: Secondary | ICD-10-CM | POA: Diagnosis not present

## 2023-02-03 DIAGNOSIS — Z8673 Personal history of transient ischemic attack (TIA), and cerebral infarction without residual deficits: Secondary | ICD-10-CM | POA: Diagnosis not present

## 2023-02-03 DIAGNOSIS — M25552 Pain in left hip: Secondary | ICD-10-CM | POA: Diagnosis not present

## 2023-02-03 DIAGNOSIS — R6889 Other general symptoms and signs: Secondary | ICD-10-CM | POA: Diagnosis not present

## 2023-02-03 DIAGNOSIS — I6782 Cerebral ischemia: Secondary | ICD-10-CM | POA: Diagnosis not present

## 2023-02-03 DIAGNOSIS — M47816 Spondylosis without myelopathy or radiculopathy, lumbar region: Secondary | ICD-10-CM | POA: Diagnosis not present

## 2023-02-03 MED ORDER — MECLIZINE HCL 12.5 MG PO TABS
12.5000 mg | ORAL_TABLET | Freq: Three times a day (TID) | ORAL | 0 refills | Status: DC | PRN
  Filled 2023-02-03 (×2): qty 30, 10d supply, fill #0

## 2023-02-03 NOTE — ED Notes (Signed)
Pt provided with AVS.  Education complete; all questions answered.  Pt leaving ED in stable condition at this time via wheelchair with all belongings.  Pt requesting assistance with getting home.  This RN notified charge RN of pt request.  Pt provided with cab voucher; ETA of about 20 minutes.  Pt taken to ED lobby to await cab arrival.

## 2023-02-03 NOTE — Discharge Instructions (Signed)
Your lab workup and imaging was reassuring, I recommend when you go from a sitting to standing position or laying down to sitting position you meant a 3-second rule, you wait 3 seconds before moving again.  This will allow your body to adjust to the new position.  Please does have 3 points of contact when changing positions.  Recommend staying hydrated.  Give you medication called meclizine can help with the dizziness take as prescribed.  Please follow-up with neurology for further evaluation.  Come back to the emergency department if you develop chest pain, shortness of breath, severe abdominal pain, uncontrolled nausea, vomiting, diarrhea.

## 2023-02-04 ENCOUNTER — Other Ambulatory Visit (HOSPITAL_COMMUNITY): Payer: Self-pay

## 2023-02-04 ENCOUNTER — Other Ambulatory Visit: Payer: Self-pay

## 2023-02-04 DIAGNOSIS — Z992 Dependence on renal dialysis: Secondary | ICD-10-CM | POA: Diagnosis not present

## 2023-02-04 DIAGNOSIS — L299 Pruritus, unspecified: Secondary | ICD-10-CM | POA: Diagnosis not present

## 2023-02-04 DIAGNOSIS — N2581 Secondary hyperparathyroidism of renal origin: Secondary | ICD-10-CM | POA: Diagnosis not present

## 2023-02-04 DIAGNOSIS — N186 End stage renal disease: Secondary | ICD-10-CM | POA: Diagnosis not present

## 2023-02-05 ENCOUNTER — Ambulatory Visit: Payer: Medicare HMO | Admitting: Physical Therapy

## 2023-02-05 ENCOUNTER — Other Ambulatory Visit (HOSPITAL_COMMUNITY): Payer: Self-pay

## 2023-02-05 ENCOUNTER — Other Ambulatory Visit: Payer: Self-pay

## 2023-02-06 DIAGNOSIS — N186 End stage renal disease: Secondary | ICD-10-CM | POA: Diagnosis not present

## 2023-02-06 DIAGNOSIS — Z992 Dependence on renal dialysis: Secondary | ICD-10-CM | POA: Diagnosis not present

## 2023-02-06 DIAGNOSIS — L299 Pruritus, unspecified: Secondary | ICD-10-CM | POA: Diagnosis not present

## 2023-02-06 DIAGNOSIS — N2581 Secondary hyperparathyroidism of renal origin: Secondary | ICD-10-CM | POA: Diagnosis not present

## 2023-02-08 DIAGNOSIS — I129 Hypertensive chronic kidney disease with stage 1 through stage 4 chronic kidney disease, or unspecified chronic kidney disease: Secondary | ICD-10-CM | POA: Diagnosis not present

## 2023-02-08 DIAGNOSIS — N186 End stage renal disease: Secondary | ICD-10-CM | POA: Diagnosis not present

## 2023-02-08 DIAGNOSIS — Z992 Dependence on renal dialysis: Secondary | ICD-10-CM | POA: Diagnosis not present

## 2023-02-09 DIAGNOSIS — Z992 Dependence on renal dialysis: Secondary | ICD-10-CM | POA: Diagnosis not present

## 2023-02-09 DIAGNOSIS — N186 End stage renal disease: Secondary | ICD-10-CM | POA: Diagnosis not present

## 2023-02-09 DIAGNOSIS — N2581 Secondary hyperparathyroidism of renal origin: Secondary | ICD-10-CM | POA: Diagnosis not present

## 2023-02-10 ENCOUNTER — Ambulatory Visit: Payer: Medicare HMO | Attending: Family Medicine | Admitting: Physical Therapy

## 2023-02-10 ENCOUNTER — Encounter: Payer: Self-pay | Admitting: Physical Therapy

## 2023-02-10 DIAGNOSIS — M62838 Other muscle spasm: Secondary | ICD-10-CM | POA: Insufficient documentation

## 2023-02-10 DIAGNOSIS — M5459 Other low back pain: Secondary | ICD-10-CM | POA: Diagnosis not present

## 2023-02-10 DIAGNOSIS — R262 Difficulty in walking, not elsewhere classified: Secondary | ICD-10-CM | POA: Insufficient documentation

## 2023-02-10 DIAGNOSIS — M6281 Muscle weakness (generalized): Secondary | ICD-10-CM | POA: Insufficient documentation

## 2023-02-10 DIAGNOSIS — M25511 Pain in right shoulder: Secondary | ICD-10-CM | POA: Diagnosis not present

## 2023-02-10 NOTE — Therapy (Signed)
OUTPATIENT PHYSICAL THERAPY CERVICAL EVALUATION   Patient Name: Paula [name] MRN: [id number] DOB:03-Nov-1945, 77 y.o., female Today's Date: 02/10/2023  END OF SESSION:  PT End of Session - 02/10/23 1433     Visit Number 4    Date for PT Re-Evaluation 04/02/23    PT Start Time 1433    PT Stop Time 1515    PT Time Calculation (min) 42 min    Activity Tolerance Patient tolerated treatment well    Behavior During Therapy [facility] for tasks assessed/performed             Past Medical History:  Diagnosis Date   Allergic rhinitis    Arthritis    Asthma    Brain aneurysm    Chronic kidney disease    Cough    Diabetes mellitus    Diastolic CHF, chronic (HCC) 10/11/2011   GERD (gastroesophageal reflux disease)    History of colon polyps 2012   tubular adenoma    Hyperlipidemia    Hypertension    Neuropathy 10/11/2011   PONV (postoperative nausea and vomiting)    one time after lymph node surgery   Past Surgical History:  Procedure Laterality Date   AV FISTULA PLACEMENT Right 10/15/2021   Procedure: ARTERIOVENOUS (AV) FISTULA  WITH PLACEMENT OF GORE-TEX STRETCH  GRAFT (4-42mmx45cm);  Surgeon: Chuck Hint, MD;  Location: [facility] OR;  Service: Vascular;  Laterality: Right;   CATARACT EXTRACTION     right eye   IR 3D INDEPENDENT WKST  12/14/2017   IR ANGIO INTRA EXTRACRAN SEL COM CAROTID INNOMINATE BILAT MOD SED  09/15/2017   IR ANGIO INTRA EXTRACRAN SEL COM CAROTID INNOMINATE UNI L MOD SED  12/14/2017   IR ANGIO VERTEBRAL SEL VERTEBRAL BILAT MOD SED  09/15/2017   IR RADIOLOGIST EVAL & MGMT  09/10/2017   IR RADIOLOGIST EVAL & MGMT  10/19/2017   lymphatic mass surgery     NASAL TURBINATE REDUCTION     RADIOLOGY WITH ANESTHESIA N/A 12/14/2017   Procedure: RADIOLOGY WITH ANESTHESIA EMBOLIZATION;  Surgeon: Julieanne Cotton, MD;  Location: [facility] OR;  Service: Radiology;  Laterality: N/A;   VIDEO BRONCHOSCOPY Bilateral 11/18/2017   Procedure: VIDEO BRONCHOSCOPY WITHOUT  FLUORO;  Surgeon: Leslye Peer, MD;  Location: [facility] ENDOSCOPY;  Service: Cardiopulmonary;  Laterality: Bilateral;   Patient Active Problem List   Diagnosis Date Noted   SNHL (sensory-neural hearing loss), 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   ESRD on dialysis (HCC) 11/20/2022   Mold exposure 08/14/2022   Opioid withdrawal (HCC) 08/14/2022   ILD (interstitial lung disease) (HCC) 08/14/2022   Secondary hyperparathyroidism, renal (HCC) 08/14/2022   Paroxysmal A-fib (HCC) 05/20/2022   Hypertrophy of nasal turbinates 09/05/2021   Chronic kidney disease, stage 5 (HCC)    Aortic atherosclerosis (HCC) 06/24/2021   Chronic rhinitis 10/01/2017   Seasonal allergic conjunctivitis 10/01/2017   Aneurysm (HCC) 09/24/2017   Acute metabolic encephalopathy 09/02/2017   Allergic rhinitis 08/20/2017   Palpitations 07/10/2016   Gout 01/23/2016   History of cataract 02/27/2015   Seasonal and perennial allergic rhinitis 02/15/2012   Chronic diastolic CHF (congestive heart failure) (HCC) 10/11/2011   GERD (gastroesophageal reflux disease)    Type 2 diabetes mellitus without complication, without long-term current use of insulin (HCC) 07/03/2010   Hyperlipidemia 07/03/2010   Essential hypertension 07/03/2010    PCP: Hoy Register, MD  REFERRING PROVIDER: Hoy Register, MD  REFERRING DIAG: muscle spasm  THERAPY DIAG:  Muscle weakness (generalized)  Difficulty in walking, not elsewhere classified  Other low  back pain  Rationale for Evaluation and Treatment: Rehabilitation  ONSET DATE: 01/08/23  SUBJECTIVE:                                                                                                                                                                                                         SUBJECTIVE STATEMENT: "Im not balanced "  PERTINENT HISTORY:  Per referring physician notes: Cerv muscle spasm, mostly occurs during dialysis-started on muscle relaxer, B chronic knee pain Recent  fall, landed on R knee-ENT consult pending.   PAIN:  Are you having pain? Yes: NPRS scale: 5/10 Pain location: back, hips, L knee neck, R shoulder Pain description: it hurts Aggravating factors: standing  Relieving factors: sit down, Tylenol  PRECAUTIONS: Fall  WEIGHT BEARING RESTRICTIONS: No  FALLS:  Has patient fallen in last 6 months? Yes. Number of falls 1  LIVING ENVIRONMENT: Lives with: lives with their family Lives in: House/apartment Stairs: Yes: External: 14 steps; bilateral but cannot reach both Having difficulty on steps Has following equipment at home: Single point cane and Walker - 2 wheeled  OCCUPATION: N/A  PLOF: Independent  PATIENT GOALS: Be able to stand and move on her own without pain  NEXT MD VISIT: August  OBJECTIVE:   DIAGNOSTIC FINDINGS:  X rays for L knee ordered.  COGNITION: Overall cognitive status: Within functional limits for tasks assessed  SENSATION: Patient reports episode of L fingers tingling during dialysis  POSTURE: rounded shoulders  PALPATION: TTP B piriformis, mildly over glut origins B, mildly in R shoulder, ant, supraspinatus tendon   CERVICAL ROM: moderately limited in B lateral flex with C/O pain in L neck   UPPER EXTREMITY ROM: Shoulder flex limited B due to C/O back pain.   UPPER EXTREMITY MMT: limited testing due to pain in back with resistance at shoulders. B elbows 4/5  CERVICAL SPECIAL TESTS:  Spurling's test: Negative  LUMBAR ROM: moderately limited in all planes due to back pain  LE ROM AND STRENGTH: SLR 60 degrees L, 70 degrees R, Knee to chest on L WNL, but pulls on ant R hip, B ER mildly limited, otherwise ROM WFL. Strength- 4-/5 B, except hip ext, abd 3+/5 FUNCTIONAL TESTS:  5 times sit to stand: 19.44 Functional gait assessment: TBD OWESTRY: 52% disability  TODAY'S TREATMENT:  DATE:  02/10/23 NuStep L5 x 7 min S2S 2x10 LAQ 2lb 2x10  Seated march 2lb 2x10 Standing shoulder flex 2lb WaTE for lumbar 2x10  Stair training 4 & 6 inch step 2 rails maintaining alternating pattern  01/29/23 NuStep L5 x 6 min Hamstring Curls 20lb 2x10 Leg Ext 5lb 2x10 S2S 2x10 Standing shoulder flex 2lb WaTE for lumbar 2x10  Standing shoulder Ext red 2x10   01/27/23 NuStep L3 x 5 min 5x S2S  LAQ 2lb 2x10 HS curls red 2x10 Ball squeeze 2x10 Hip Abd green 2x12  Standing marches   01/22/23 Education   PATIENT EDUCATION:  Education details: POC Person educated: Patient Education method: Explanation Education comprehension: verbalized understanding  HOME EXERCISE PROGRAM: Access Code: [email or web address] URL: [email or web address] Date: 02/10/2023 Prepared by: Debroah Baller  Exercises - Sit to Stand with Armchair  - 1 x daily - 7 x weekly - 3 sets - 10 reps - Seated Long Arc Quad  - 1 x daily - 7 x weekly - 3 sets - 10 reps - Seated March  - 1 x daily - 7 x weekly - 3 sets - 10 reps  ASSESSMENT:  CLINICAL IMPRESSION: Patient is a 77 y.o. who was seen today for physical therapy treatment for Multiple areas of pain after falling. Dialysis M, W, Sat. Her primary source of pain is her low back. She cannot stand for more than 5 minutes, is having increased difficulty managing steps. Session consisted of light LE and postural strengthening interventions. Gave pt HEP and all interventions were completed with understanding. Cue not to allow LE push against table w/ S2S needed. Tactile cues needed to maintain alternating pattern with stair training.  OBJECTIVE IMPAIRMENTS: Abnormal gait, decreased activity tolerance, decreased balance, decreased endurance, decreased mobility, difficulty walking, decreased ROM, decreased strength, increased muscle spasms, and pain.   ACTIVITY LIMITATIONS: carrying, lifting, bending, standing, squatting, stairs, and locomotion  level  PARTICIPATION LIMITATIONS: meal prep, cleaning, laundry, shopping, and community activity  PERSONAL FACTORS: Past/current experiences are also affecting patient's functional outcome.   REHAB POTENTIAL: Good  CLINICAL DECISION MAKING: Stable/uncomplicated  EVALUATION COMPLEXITY: Moderate   GOALS: Goals reviewed with patient? Yes  SHORT TERM GOALS: Target date: 02/07/23  I with initial HEP Baseline:  Goal status: INITIAL  LONG TERM GOALS: Target date: 04/02/23  I with final HEP Baseline:  Goal status: INITIAL  2.  Improve Owestry score to < 25% for minimal disability Baseline: 52% Goal status: Ongoing  3.  Patient will demonstrate full, pain free ROM of B shoulders and hips Baseline: Movement causes back pain Goal status: INITIAL  4.  Increase B hip strength to at least 4/5  Baseline: 3+/5 Goal status: INITIAL  5.  Patient will score at least 24/30 on FGA Baseline: TBD Goal status: INITIAL  6.  Patient will safely ambulate up and down at least 12 step using rail and S. Baseline: up to min A on steps Goal status: ongoing    PLAN:  PT FREQUENCY: 1-2x/week  PT DURATION: 10 weeks  PLANNED INTERVENTIONS: Therapeutic exercises, Therapeutic activity, Neuromuscular re-education, Balance training, Gait training, Patient/Family education, Self Care, Joint mobilization, Stair training, Dry Needling, Electrical stimulation, Spinal mobilization, Cryotherapy, Moist heat, Ionotophoresis 4mg /ml Dexamethasone, and Manual therapy  PLAN FOR NEXT SESSION: Complete 5 x STS, FGA, initiate HEP for stretch and strengthening of Upper and Lower trunk   Debroah Baller, PTA 02/10/2023, 2:34 PM

## 2023-02-11 DIAGNOSIS — Z992 Dependence on renal dialysis: Secondary | ICD-10-CM | POA: Diagnosis not present

## 2023-02-11 DIAGNOSIS — N2581 Secondary hyperparathyroidism of renal origin: Secondary | ICD-10-CM | POA: Diagnosis not present

## 2023-02-11 DIAGNOSIS — N186 End stage renal disease: Secondary | ICD-10-CM | POA: Diagnosis not present

## 2023-02-13 DIAGNOSIS — N2581 Secondary hyperparathyroidism of renal origin: Secondary | ICD-10-CM | POA: Diagnosis not present

## 2023-02-13 DIAGNOSIS — N186 End stage renal disease: Secondary | ICD-10-CM | POA: Diagnosis not present

## 2023-02-13 DIAGNOSIS — Z992 Dependence on renal dialysis: Secondary | ICD-10-CM | POA: Diagnosis not present

## 2023-02-16 DIAGNOSIS — N2581 Secondary hyperparathyroidism of renal origin: Secondary | ICD-10-CM | POA: Diagnosis not present

## 2023-02-16 DIAGNOSIS — N186 End stage renal disease: Secondary | ICD-10-CM | POA: Diagnosis not present

## 2023-02-16 DIAGNOSIS — Z992 Dependence on renal dialysis: Secondary | ICD-10-CM | POA: Diagnosis not present

## 2023-02-17 ENCOUNTER — Encounter: Payer: Self-pay | Admitting: Physical Therapy

## 2023-02-17 ENCOUNTER — Ambulatory Visit: Payer: Medicare HMO | Admitting: Physical Therapy

## 2023-02-17 DIAGNOSIS — M62838 Other muscle spasm: Secondary | ICD-10-CM | POA: Diagnosis not present

## 2023-02-17 DIAGNOSIS — R262 Difficulty in walking, not elsewhere classified: Secondary | ICD-10-CM | POA: Diagnosis not present

## 2023-02-17 DIAGNOSIS — J329 Chronic sinusitis, unspecified: Secondary | ICD-10-CM | POA: Diagnosis not present

## 2023-02-17 DIAGNOSIS — R6889 Other general symptoms and signs: Secondary | ICD-10-CM | POA: Diagnosis not present

## 2023-02-17 DIAGNOSIS — M6281 Muscle weakness (generalized): Secondary | ICD-10-CM

## 2023-02-17 DIAGNOSIS — R053 Chronic cough: Secondary | ICD-10-CM | POA: Diagnosis not present

## 2023-02-17 DIAGNOSIS — M25511 Pain in right shoulder: Secondary | ICD-10-CM

## 2023-02-17 DIAGNOSIS — M5459 Other low back pain: Secondary | ICD-10-CM

## 2023-02-17 NOTE — Therapy (Signed)
OUTPATIENT PHYSICAL THERAPY CERVICAL EVALUATION   Patient Name: Paula [name] MRN: [id number] DOB:[birth year], 77 y.o., female Today's Date: 02/17/2023  END OF SESSION:  PT End of Session - 02/17/23 1330     Visit Number 5    Date for PT Re-Evaluation 04/02/23    PT Start Time 1325    PT Stop Time 1358    PT Time Calculation (min) 33 min    Activity Tolerance Patient tolerated treatment well    Behavior During Therapy [facility] for tasks assessed/performed             Past Medical History:  Diagnosis Date   Allergic rhinitis    Arthritis    Asthma    Brain aneurysm    Chronic kidney disease    Cough    Diabetes mellitus    Diastolic CHF, chronic (HCC) 10/11/2011   GERD (gastroesophageal reflux disease)    History of colon polyps 2012   tubular adenoma    Hyperlipidemia    Hypertension    Neuropathy 10/11/2011   PONV (postoperative nausea and vomiting)    one time after lymph node surgery   Past Surgical History:  Procedure Laterality Date   AV FISTULA PLACEMENT Right 10/15/2021   Procedure: ARTERIOVENOUS (AV) FISTULA  WITH PLACEMENT OF GORE-TEX STRETCH  GRAFT (4-23mmx45cm);  Surgeon: Chuck Hint, MD;  Location: [facility] OR;  Service: Vascular;  Laterality: Right;   CATARACT EXTRACTION     right eye   IR 3D INDEPENDENT WKST  12/14/2017   IR ANGIO INTRA EXTRACRAN SEL COM CAROTID INNOMINATE BILAT MOD SED  09/15/2017   IR ANGIO INTRA EXTRACRAN SEL COM CAROTID INNOMINATE UNI L MOD SED  12/14/2017   IR ANGIO VERTEBRAL SEL VERTEBRAL BILAT MOD SED  09/15/2017   IR RADIOLOGIST EVAL & MGMT  09/10/2017   IR RADIOLOGIST EVAL & MGMT  10/19/2017   lymphatic mass surgery     NASAL TURBINATE REDUCTION     RADIOLOGY WITH ANESTHESIA N/A 12/14/2017   Procedure: RADIOLOGY WITH ANESTHESIA EMBOLIZATION;  Surgeon: Julieanne Cotton, MD;  Location: [facility] OR;  Service: Radiology;  Laterality: N/A;   VIDEO BRONCHOSCOPY Bilateral 11/18/2017   Procedure: VIDEO BRONCHOSCOPY WITHOUT  FLUORO;  Surgeon: Leslye Peer, MD;  Location: [facility];  Service: Cardiopulmonary;  Laterality: Bilateral;   Patient Active Problem List   Diagnosis Date Noted   SNHL (sensory-neural hearing loss), 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   ESRD on dialysis (HCC) 11/20/2022   Mold exposure 08/14/2022   Opioid withdrawal (HCC) 08/14/2022   ILD (interstitial lung disease) (HCC) 08/14/2022   Secondary hyperparathyroidism, renal (HCC) 08/14/2022   Paroxysmal A-fib (HCC) 05/20/2022   Hypertrophy of nasal turbinates 09/05/2021   Chronic kidney disease, stage 5 (HCC)    Aortic atherosclerosis (HCC) 06/24/2021   Chronic rhinitis 10/01/2017   Seasonal allergic conjunctivitis 10/01/2017   Aneurysm (HCC) 09/24/2017   Acute metabolic encephalopathy 09/02/2017   Allergic rhinitis 08/20/2017   Palpitations 07/10/2016   Gout 01/23/2016   History of cataract 02/27/2015   Seasonal and perennial allergic rhinitis 02/15/2012   Chronic diastolic CHF (congestive heart failure) (HCC) 10/11/2011   GERD (gastroesophageal reflux disease)    Type 2 diabetes mellitus without complication, without long-term current use of insulin (HCC) 07/03/2010   Hyperlipidemia 07/03/2010   Essential hypertension 07/03/2010    PCP: Hoy Register, MD  REFERRING PROVIDER: Hoy Register, MD  REFERRING DIAG: muscle spasm  THERAPY DIAG:  Muscle weakness (generalized)  Difficulty in walking, not elsewhere classified  Other low  back pain  Other muscle spasm  Acute pain of right shoulder  Rationale for Evaluation and Treatment: Rehabilitation  ONSET DATE: 01/08/23  SUBJECTIVE:                                                                                                                                                                                                         SUBJECTIVE STATEMENT: Patient reports that she is performing HEP exercises and feels they are helping. Her back pain is a max of 5.  PERTINENT  HISTORY:  Per referring physician notes: Cerv muscle spasm, mostly occurs during dialysis-started on muscle relaxer, B chronic knee pain Recent fall, landed on R knee-ENT consult pending.   PAIN:  Are you having pain? Yes: NPRS scale: 5/10 Pain location: back, hips, L knee neck, R shoulder Pain description: it hurts Aggravating factors: standing  Relieving factors: sit down, Tylenol  PRECAUTIONS: Fall  WEIGHT BEARING RESTRICTIONS: No  FALLS:  Has patient fallen in last 6 months? Yes. Number of falls 1  LIVING ENVIRONMENT: Lives with: lives with their family Lives in: House/apartment Stairs: Yes: External: 14 steps; bilateral but cannot reach both Having difficulty on steps Has following equipment at home: Single point cane and Walker - 2 wheeled  OCCUPATION: N/A  PLOF: Independent  PATIENT GOALS: Be able to stand and move on her own without pain  NEXT MD VISIT: August  OBJECTIVE:   DIAGNOSTIC FINDINGS:  X rays for L knee ordered.  COGNITION: Overall cognitive status: Within functional limits for tasks assessed  SENSATION: Patient reports episode of L fingers tingling during dialysis  POSTURE: rounded shoulders  PALPATION: TTP B piriformis, mildly over glut origins B, mildly in R shoulder, ant, supraspinatus tendon   CERVICAL ROM: moderately limited in B lateral flex with C/O pain in L neck   UPPER EXTREMITY ROM: Shoulder flex limited B due to C/O back pain.   UPPER EXTREMITY MMT: limited testing due to pain in back with resistance at shoulders. B elbows 4/5  CERVICAL SPECIAL TESTS:  Spurling's test: Negative  LUMBAR ROM: moderately limited in all planes due to back pain  LE ROM AND STRENGTH: SLR 60 degrees L, 70 degrees R, Knee to chest on L WNL, but pulls on ant R hip, B ER mildly limited, otherwise ROM WFL. Strength- 4-/5 B, except hip ext, abd 3+/5 FUNCTIONAL TESTS:  5 times sit to stand: 19.44 Functional gait assessment: TBD OWESTRY: 52%  disability  TODAY'S TREATMENT:  DATE:  02/17/23 NuStep L4 x 5 minutes Seated hip stretching- HS, glut stretch, figure 4, 2 x 15 sec each  Seated hip abd against G tband, 2 x 10 reps Seated rotational walk out and back with hands for trunk stretch and strengthening, 5x to each side. Standing heel raises. X 10  02/10/23 NuStep L5 x 7 min S2S 2x10 LAQ 2lb 2x10  Seated march 2lb 2x10 Standing shoulder flex 2lb WaTE for lumbar 2x10  Stair training 4 & 6 inch step 2 rails maintaining alternating pattern  01/29/23 NuStep L5 x 6 min Hamstring Curls 20lb 2x10 Leg Ext 5lb 2x10 S2S 2x10 Standing shoulder flex 2lb WaTE for lumbar 2x10  Standing shoulder Ext red 2x10   01/27/23 NuStep L3 x 5 min 5x S2S  LAQ 2lb 2x10 HS curls red 2x10 Ball squeeze 2x10 Hip Abd green 2x12  Standing marches   01/22/23 Education   PATIENT EDUCATION:  Education details: POC Person educated: Patient Education method: Explanation Education comprehension: verbalized understanding  HOME EXERCISE PROGRAM: Access Code: [email or web address] URL: [email or web address] Date: 02/10/2023 Prepared by: Debroah Baller  Exercises - Sit to Stand with Armchair  - 1 x daily - 7 x weekly - 3 sets - 10 reps - Seated Long Arc Quad  - 1 x daily - 7 x weekly - 3 sets - 10 reps - Seated March  - 1 x daily - 7 x weekly - 3 sets - 10 reps  ASSESSMENT:  CLINICAL IMPRESSION: Patient arrived a few minutes late, got stung by a bee on her way down her steps. She tolerated additional stretching to hips and strengthening to hips and trunk, with updated HEP  OBJECTIVE IMPAIRMENTS: Abnormal gait, decreased activity tolerance, decreased balance, decreased endurance, decreased mobility, difficulty walking, decreased ROM, decreased strength, increased muscle spasms, and pain.   ACTIVITY LIMITATIONS:  carrying, lifting, bending, standing, squatting, stairs, and locomotion level  PARTICIPATION LIMITATIONS: meal prep, cleaning, laundry, shopping, and community activity  PERSONAL FACTORS: Past/current experiences are also affecting patient's functional outcome.   REHAB POTENTIAL: Good  CLINICAL DECISION MAKING: Stable/uncomplicated  EVALUATION COMPLEXITY: Moderate   GOALS: Goals reviewed with patient? Yes  SHORT TERM GOALS: Target date: 02/07/23  I with initial HEP Baseline:  Goal status: 02/17/23-met  LONG TERM GOALS: Target date: 04/02/23  I with final HEP Baseline:  Goal status: INITIAL  2.  Improve Owestry score to < 25% for minimal disability Baseline: 52% Goal status: Ongoing  3.  Patient will demonstrate full, pain free ROM of B shoulders and hips Baseline: Movement causes back pain Goal status: INITIAL  4.  Increase B hip strength to at least 4/5  Baseline: 3+/5 Goal status: INITIAL  5.  Patient will score at least 24/30 on FGA Baseline: TBD Goal status: INITIAL  6.  Patient will safely ambulate up and down at least 12 step using rail and S. Baseline: up to min A on steps Goal status: ongoing    PLAN:  PT FREQUENCY: 1-2x/week  PT DURATION: 10 weeks  PLANNED INTERVENTIONS: Therapeutic exercises, Therapeutic activity, Neuromuscular re-education, Balance training, Gait training, Patient/Family education, Self Care, Joint mobilization, Stair training, Dry Needling, Electrical stimulation, Spinal mobilization, Cryotherapy, Moist heat, Ionotophoresis 4mg /ml Dexamethasone, and Manual therapy  PLAN FOR NEXT SESSION: Complete 5 x STS, FGA, initiate HEP for stretch and strengthening of Upper and Lower trunk  Oley Balm DPT 02/17/23 2:04 PM

## 2023-02-18 DIAGNOSIS — N2581 Secondary hyperparathyroidism of renal origin: Secondary | ICD-10-CM | POA: Diagnosis not present

## 2023-02-18 DIAGNOSIS — Z992 Dependence on renal dialysis: Secondary | ICD-10-CM | POA: Diagnosis not present

## 2023-02-18 DIAGNOSIS — N186 End stage renal disease: Secondary | ICD-10-CM | POA: Diagnosis not present

## 2023-02-19 ENCOUNTER — Ambulatory Visit: Payer: Medicare HMO | Admitting: Physical Therapy

## 2023-02-19 DIAGNOSIS — R053 Chronic cough: Secondary | ICD-10-CM | POA: Diagnosis not present

## 2023-02-19 DIAGNOSIS — R058 Other specified cough: Secondary | ICD-10-CM | POA: Diagnosis not present

## 2023-02-19 DIAGNOSIS — H93293 Other abnormal auditory perceptions, bilateral: Secondary | ICD-10-CM | POA: Insufficient documentation

## 2023-02-20 DIAGNOSIS — Z992 Dependence on renal dialysis: Secondary | ICD-10-CM | POA: Diagnosis not present

## 2023-02-20 DIAGNOSIS — N2581 Secondary hyperparathyroidism of renal origin: Secondary | ICD-10-CM | POA: Diagnosis not present

## 2023-02-20 DIAGNOSIS — N186 End stage renal disease: Secondary | ICD-10-CM | POA: Diagnosis not present

## 2023-02-23 DIAGNOSIS — Z992 Dependence on renal dialysis: Secondary | ICD-10-CM | POA: Diagnosis not present

## 2023-02-23 DIAGNOSIS — N186 End stage renal disease: Secondary | ICD-10-CM | POA: Diagnosis not present

## 2023-02-23 DIAGNOSIS — N2581 Secondary hyperparathyroidism of renal origin: Secondary | ICD-10-CM | POA: Diagnosis not present

## 2023-02-24 ENCOUNTER — Encounter: Payer: Self-pay | Admitting: Physical Therapy

## 2023-02-24 ENCOUNTER — Ambulatory Visit: Payer: Medicare HMO | Admitting: Physical Therapy

## 2023-02-24 DIAGNOSIS — M25511 Pain in right shoulder: Secondary | ICD-10-CM | POA: Diagnosis not present

## 2023-02-24 DIAGNOSIS — M5459 Other low back pain: Secondary | ICD-10-CM | POA: Diagnosis not present

## 2023-02-24 DIAGNOSIS — M6281 Muscle weakness (generalized): Secondary | ICD-10-CM

## 2023-02-24 DIAGNOSIS — R262 Difficulty in walking, not elsewhere classified: Secondary | ICD-10-CM | POA: Diagnosis not present

## 2023-02-24 DIAGNOSIS — M62838 Other muscle spasm: Secondary | ICD-10-CM | POA: Diagnosis not present

## 2023-02-24 NOTE — Therapy (Signed)
OUTPATIENT PHYSICAL THERAPY CERVICAL EVALUATION   Patient Name: Paula [name] MRN: [id number] DOB:[birth year], 77 y.o., female Today's Date: 02/24/2023  END OF SESSION:  PT End of Session - 02/24/23 1433     Visit Number 6    Date for PT Re-Evaluation 04/02/23    PT Start Time 1433    PT Stop Time 1515    PT Time Calculation (min) 42 min    Activity Tolerance Patient tolerated treatment well    Behavior During Therapy [facility] for tasks assessed/performed             Past Medical History:  Diagnosis Date   Allergic rhinitis    Arthritis    Asthma    Brain aneurysm    Chronic kidney disease    Cough    Diabetes mellitus    Diastolic CHF, chronic (HCC) 10/11/2011   GERD (gastroesophageal reflux disease)    History of colon polyps 2012   tubular adenoma    Hyperlipidemia    Hypertension    Neuropathy 10/11/2011   PONV (postoperative nausea and vomiting)    one time after lymph node surgery   Past Surgical History:  Procedure Laterality Date   AV FISTULA PLACEMENT Right 10/15/2021   Procedure: ARTERIOVENOUS (AV) FISTULA  WITH PLACEMENT OF GORE-TEX STRETCH  GRAFT (4-67mmx45cm);  Surgeon: Chuck Hint, MD;  Location: [facility] OR;  Service: Vascular;  Laterality: Right;   CATARACT EXTRACTION     right eye   IR 3D INDEPENDENT WKST  12/14/2017   IR ANGIO INTRA EXTRACRAN SEL COM CAROTID INNOMINATE BILAT MOD SED  09/15/2017   IR ANGIO INTRA EXTRACRAN SEL COM CAROTID INNOMINATE UNI L MOD SED  12/14/2017   IR ANGIO VERTEBRAL SEL VERTEBRAL BILAT MOD SED  09/15/2017   IR RADIOLOGIST EVAL & MGMT  09/10/2017   IR RADIOLOGIST EVAL & MGMT  10/19/2017   lymphatic mass surgery     NASAL TURBINATE REDUCTION     RADIOLOGY WITH ANESTHESIA N/A 12/14/2017   Procedure: RADIOLOGY WITH ANESTHESIA EMBOLIZATION;  Surgeon: Julieanne Cotton, MD;  Location: [facility] OR;  Service: Radiology;  Laterality: N/A;   VIDEO BRONCHOSCOPY Bilateral 11/18/2017   Procedure: VIDEO BRONCHOSCOPY WITHOUT  FLUORO;  Surgeon: Leslye Peer, MD;  Location: [facility] ENDOSCOPY;  Service: Cardiopulmonary;  Laterality: Bilateral;   Patient Active Problem List   Diagnosis Date Noted   SNHL (sensory-neural hearing loss), 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   ESRD on dialysis (HCC) 11/20/2022   Mold exposure 08/14/2022   Opioid withdrawal (HCC) 08/14/2022   ILD (interstitial lung disease) (HCC) 08/14/2022   Secondary hyperparathyroidism, renal (HCC) 08/14/2022   Paroxysmal A-fib (HCC) 05/20/2022   Hypertrophy of nasal turbinates 09/05/2021   Chronic kidney disease, stage 5 (HCC)    Aortic atherosclerosis (HCC) 06/24/2021   Chronic rhinitis 10/01/2017   Seasonal allergic conjunctivitis 10/01/2017   Aneurysm (HCC) 09/24/2017   Acute metabolic encephalopathy 09/02/2017   Allergic rhinitis 08/20/2017   Palpitations 07/10/2016   Gout 01/23/2016   History of cataract 02/27/2015   Seasonal and perennial allergic rhinitis 02/15/2012   Chronic diastolic CHF (congestive heart failure) (HCC) 10/11/2011   GERD (gastroesophageal reflux disease)    Type 2 diabetes mellitus without complication, without long-term current use of insulin (HCC) 07/03/2010   Hyperlipidemia 07/03/2010   Essential hypertension 07/03/2010    PCP: Hoy Register, MD  REFERRING PROVIDER: Hoy Register, MD  REFERRING DIAG: muscle spasm  THERAPY DIAG:  Muscle weakness (generalized)  Difficulty in walking, not elsewhere classified  Other low  back pain  Rationale for Evaluation and Treatment: Rehabilitation  ONSET DATE: 01/08/23  SUBJECTIVE:                                                                                                                                                                                                         SUBJECTIVE STATEMENT: "Im ok"  PERTINENT HISTORY:  Per referring physician notes: Cerv muscle spasm, mostly occurs during dialysis-started on muscle relaxer, B chronic knee pain Recent fall,  landed on R knee-ENT consult pending.   PAIN:  Are you having pain? Yes: NPRS scale: 2/10 Pain location: back, hips, L knee neck, R shoulder Pain description: it hurts Aggravating factors: standing  Relieving factors: sit down, Tylenol  PRECAUTIONS: Fall  WEIGHT BEARING RESTRICTIONS: No  FALLS:  Has patient fallen in last 6 months? Yes. Number of falls 1  LIVING ENVIRONMENT: Lives with: lives with their family Lives in: House/apartment Stairs: Yes: External: 14 steps; bilateral but cannot reach both Having difficulty on steps Has following equipment at home: Single point cane and Walker - 2 wheeled  OCCUPATION: N/A  PLOF: Independent  PATIENT GOALS: Be able to stand and move on her own without pain  NEXT MD VISIT: August  OBJECTIVE:   DIAGNOSTIC FINDINGS:  X rays for L knee ordered.  COGNITION: Overall cognitive status: Within functional limits for tasks assessed  SENSATION: Patient reports episode of L fingers tingling during dialysis  POSTURE: rounded shoulders  PALPATION: TTP B piriformis, mildly over glut origins B, mildly in R shoulder, ant, supraspinatus tendon   CERVICAL ROM: moderately limited in B lateral flex with C/O pain in L neck   UPPER EXTREMITY ROM: Shoulder flex limited B due to C/O back pain.   UPPER EXTREMITY MMT: limited testing due to pain in back with resistance at shoulders. B elbows 4/5  CERVICAL SPECIAL TESTS:  Spurling's test: Negative  LUMBAR ROM: moderately limited in all planes due to back pain  LE ROM AND STRENGTH: SLR 60 degrees L, 70 degrees R, Knee to chest on L WNL, but pulls on ant R hip, B ER mildly limited, otherwise ROM WFL. Strength- 4-/5 B, except hip ext, abd 3+/5 FUNCTIONAL TESTS:  5 times sit to stand: 19.44 Functional gait assessment: TBD OWESTRY: 52% disability  TODAY'S TREATMENT:  DATE:  02/24/23 UBE L1 x 2 min each some R arm discomfort NuStep L4 x 4 min S2S holding yellow ball 2x8 min assist at times  Rows & Ext green 2x10 Bridges x10 SLR x 10 each  02/17/23 NuStep L4 x 5 minutes Seated hip stretching- HS, glut stretch, figure 4, 2 x 15 sec each  Seated hip abd against G tband, 2 x 10 reps Seated rotational walk out and back with hands for trunk stretch and strengthening, 5x to each side. Standing heel raises. X 10  02/10/23 NuStep L5 x 7 min S2S 2x10 LAQ 2lb 2x10  Seated march 2lb 2x10 Standing shoulder flex 2lb WaTE for lumbar 2x10  Stair training 4 & 6 inch step 2 rails maintaining alternating pattern  01/29/23 NuStep L5 x 6 min Hamstring Curls 20lb 2x10 Leg Ext 5lb 2x10 S2S 2x10 Standing shoulder flex 2lb WaTE for lumbar 2x10  Standing shoulder Ext red 2x10   01/27/23 NuStep L3 x 5 min 5x S2S  LAQ 2lb 2x10 HS curls red 2x10 Ball squeeze 2x10 Hip Abd green 2x12  Standing marches   01/22/23 Education   PATIENT EDUCATION:  Education details: POC Person educated: Patient Education method: Explanation Education comprehension: verbalized understanding  HOME EXERCISE PROGRAM: Access Code: [email or web address] URL: [email or web address] Date: 02/10/2023 Prepared by: Debroah Baller  Exercises - Sit to Stand with Armchair  - 1 x daily - 7 x weekly - 3 sets - 10 reps - Seated Long Arc Quad  - 1 x daily - 7 x weekly - 3 sets - 10 reps - Seated March  - 1 x daily - 7 x weekly - 3 sets - 10 reps  ASSESSMENT:  CLINICAL IMPRESSION: Patient arrived a few minutes late, some RUE discomfort with UBE. Cues needed not to allow LE to pus against mat table with sit to stands. She did become fatigue with supine bridges and SLR. No reports of increase pain during session.  OBJECTIVE IMPAIRMENTS: Abnormal gait, decreased activity tolerance, decreased balance, decreased endurance, decreased mobility, difficulty walking, decreased ROM, decreased  strength, increased muscle spasms, and pain.   ACTIVITY LIMITATIONS: carrying, lifting, bending, standing, squatting, stairs, and locomotion level  PARTICIPATION LIMITATIONS: meal prep, cleaning, laundry, shopping, and community activity  PERSONAL FACTORS: Past/current experiences are also affecting patient's functional outcome.   REHAB POTENTIAL: Good  CLINICAL DECISION MAKING: Stable/uncomplicated  EVALUATION COMPLEXITY: Moderate   GOALS: Goals reviewed with patient? Yes  SHORT TERM GOALS: Target date: 02/07/23  I with initial HEP Baseline:  Goal status: 02/17/23-met  LONG TERM GOALS: Target date: 04/02/23  I with final HEP Baseline:  Goal status: INITIAL  2.  Improve Owestry score to < 25% for minimal disability Baseline: 52% Goal status: Ongoing  3.  Patient will demonstrate full, pain free ROM of B shoulders and hips Baseline: Movement causes back pain Goal status: INITIAL  4.  Increase B hip strength to at least 4/5  Baseline: 3+/5 Goal status: INITIAL  5.  Patient will score at least 24/30 on FGA Baseline: TBD Goal status: INITIAL  6.  Patient will safely ambulate up and down at least 12 step using rail and S. Baseline: up to min A on steps Goal status: ongoing    PLAN:  PT FREQUENCY: 1-2x/week  PT DURATION: 10 weeks  PLANNED INTERVENTIONS: Therapeutic exercises, Therapeutic activity, Neuromuscular re-education, Balance training, Gait training, Patient/Family education, Self Care, Joint mobilization, Stair training, Dry Needling, Electrical stimulation, Spinal mobilization, Cryotherapy, Moist heat, Ionotophoresis 4mg /ml Dexamethasone, and Manual  therapy  PLAN FOR NEXT SESSION: Complete 5 x STS, FGA, initiate HEP for stretch and strengthening of Upper and Lower trunk  Oley Balm DPT 02/24/23 2:34 PM

## 2023-02-25 DIAGNOSIS — N2581 Secondary hyperparathyroidism of renal origin: Secondary | ICD-10-CM | POA: Diagnosis not present

## 2023-02-25 DIAGNOSIS — N186 End stage renal disease: Secondary | ICD-10-CM | POA: Diagnosis not present

## 2023-02-25 DIAGNOSIS — Z992 Dependence on renal dialysis: Secondary | ICD-10-CM | POA: Diagnosis not present

## 2023-02-26 ENCOUNTER — Encounter: Payer: Self-pay | Admitting: Physical Therapy

## 2023-02-26 ENCOUNTER — Ambulatory Visit: Payer: Medicare HMO | Admitting: Physical Therapy

## 2023-02-26 DIAGNOSIS — R262 Difficulty in walking, not elsewhere classified: Secondary | ICD-10-CM

## 2023-02-26 DIAGNOSIS — M6281 Muscle weakness (generalized): Secondary | ICD-10-CM | POA: Diagnosis not present

## 2023-02-26 DIAGNOSIS — M25511 Pain in right shoulder: Secondary | ICD-10-CM | POA: Diagnosis not present

## 2023-02-26 DIAGNOSIS — M62838 Other muscle spasm: Secondary | ICD-10-CM

## 2023-02-26 DIAGNOSIS — M5459 Other low back pain: Secondary | ICD-10-CM | POA: Diagnosis not present

## 2023-02-26 NOTE — Therapy (Signed)
OUTPATIENT PHYSICAL THERAPY CERVICAL EVALUATION   Patient Name: Paula [name] MRN: [id number] DOB:1945/10/21, 77 y.o., female Today's Date: 02/26/2023  END OF SESSION:  PT End of Session - 02/26/23 1436     Visit Number 7    Date for PT Re-Evaluation 04/02/23    PT Start Time 1436    PT Stop Time 1515    PT Time Calculation (min) 39 min    Activity Tolerance Patient tolerated treatment well    Behavior During Therapy [facility] for tasks assessed/performed             Past Medical History:  Diagnosis Date   Allergic rhinitis    Arthritis    Asthma    Brain aneurysm    Chronic kidney disease    Cough    Diabetes mellitus    Diastolic CHF, chronic (HCC) 10/11/2011   GERD (gastroesophageal reflux disease)    History of colon polyps 2012   tubular adenoma    Hyperlipidemia    Hypertension    Neuropathy 10/11/2011   PONV (postoperative nausea and vomiting)    one time after lymph node surgery   Past Surgical History:  Procedure Laterality Date   AV FISTULA PLACEMENT Right 10/15/2021   Procedure: ARTERIOVENOUS (AV) FISTULA  WITH PLACEMENT OF GORE-TEX STRETCH  GRAFT (4-40mmx45cm);  Surgeon: Chuck Hint, MD;  Location: [facility] OR;  Service: Vascular;  Laterality: Right;   CATARACT EXTRACTION     right eye   IR 3D INDEPENDENT WKST  12/14/2017   IR ANGIO INTRA EXTRACRAN SEL COM CAROTID INNOMINATE BILAT MOD SED  09/15/2017   IR ANGIO INTRA EXTRACRAN SEL COM CAROTID INNOMINATE UNI L MOD SED  12/14/2017   IR ANGIO VERTEBRAL SEL VERTEBRAL BILAT MOD SED  09/15/2017   IR RADIOLOGIST EVAL & MGMT  09/10/2017   IR RADIOLOGIST EVAL & MGMT  10/19/2017   lymphatic mass surgery     NASAL TURBINATE REDUCTION     RADIOLOGY WITH ANESTHESIA N/A 12/14/2017   Procedure: RADIOLOGY WITH ANESTHESIA EMBOLIZATION;  Surgeon: Julieanne Cotton, MD;  Location: [facility] OR;  Service: Radiology;  Laterality: N/A;   VIDEO BRONCHOSCOPY Bilateral 11/18/2017   Procedure: VIDEO BRONCHOSCOPY WITHOUT  FLUORO;  Surgeon: Leslye Peer, MD;  Location: [facility];  Service: Cardiopulmonary;  Laterality: Bilateral;   Patient Active Problem List   Diagnosis Date Noted   SNHL (sensory-neural hearing loss), 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   ESRD on dialysis (HCC) 11/20/2022   Mold exposure 08/14/2022   Opioid withdrawal (HCC) 08/14/2022   ILD (interstitial lung disease) (HCC) 08/14/2022   Secondary hyperparathyroidism, renal (HCC) 08/14/2022   Paroxysmal A-fib (HCC) 05/20/2022   Hypertrophy of nasal turbinates 09/05/2021   Chronic kidney disease, stage 5 (HCC)    Aortic atherosclerosis (HCC) 06/24/2021   Chronic rhinitis 10/01/2017   Seasonal allergic conjunctivitis 10/01/2017   Aneurysm (HCC) 09/24/2017   Acute metabolic encephalopathy 09/02/2017   Allergic rhinitis 08/20/2017   Palpitations 07/10/2016   Gout 01/23/2016   History of cataract 02/27/2015   Seasonal and perennial allergic rhinitis 02/15/2012   Chronic diastolic CHF (congestive heart failure) (HCC) 10/11/2011   GERD (gastroesophageal reflux disease)    Type 2 diabetes mellitus without complication, without long-term current use of insulin (HCC) 07/03/2010   Hyperlipidemia 07/03/2010   Essential hypertension 07/03/2010    PCP: Hoy Register, MD  REFERRING PROVIDER: Hoy Register, MD  REFERRING DIAG: muscle spasm  THERAPY DIAG:  Muscle weakness (generalized)  Difficulty in walking, not elsewhere classified  Other low  back pain  Other muscle spasm  Rationale for Evaluation and Treatment: Rehabilitation  ONSET DATE: 01/08/23  SUBJECTIVE:                                                                                                                                                                                                         SUBJECTIVE STATEMENT: "I wasn't feeling well, Sometimes I dont" She reports checking her BP and sugar   PERTINENT HISTORY:  Per referring physician notes: Cerv muscle spasm,  mostly occurs during dialysis-started on muscle relaxer, B chronic knee pain Recent fall, landed on R knee-ENT consult pending.   PAIN:  Are you having pain? Yes: NPRS scale: 3/10 Pain location: back, hips, L knee neck, R shoulder Pain description: it hurts Aggravating factors: standing  Relieving factors: sit down, Tylenol  PRECAUTIONS: Fall  WEIGHT BEARING RESTRICTIONS: No  FALLS:  Has patient fallen in last 6 months? Yes. Number of falls 1  LIVING ENVIRONMENT: Lives with: lives with their family Lives in: House/apartment Stairs: Yes: External: 14 steps; bilateral but cannot reach both Having difficulty on steps Has following equipment at home: Single point cane and Walker - 2 wheeled  OCCUPATION: N/A  PLOF: Independent  PATIENT GOALS: Be able to stand and move on her own without pain  NEXT MD VISIT: August  OBJECTIVE:   DIAGNOSTIC FINDINGS:  X rays for L knee ordered.  COGNITION: Overall cognitive status: Within functional limits for tasks assessed  SENSATION: Patient reports episode of L fingers tingling during dialysis  POSTURE: rounded shoulders  PALPATION: TTP B piriformis, mildly over glut origins B, mildly in R shoulder, ant, supraspinatus tendon   CERVICAL ROM: moderately limited in B lateral flex with C/O pain in L neck   UPPER EXTREMITY ROM: Shoulder flex limited B due to C/O back pain.   UPPER EXTREMITY MMT: limited testing due to pain in back with resistance at shoulders. B elbows 4/5  CERVICAL SPECIAL TESTS:  Spurling's test: Negative  LUMBAR ROM: moderately limited in all planes due to back pain  LE ROM AND STRENGTH: SLR 60 degrees L, 70 degrees R, Knee to chest on L WNL, but pulls on ant R hip, B ER mildly limited, otherwise ROM WFL. Strength- 4-/5 B, except hip ext, abd 3+/5 FUNCTIONAL TESTS:  5 times sit to stand: 19.44 Functional gait assessment: TBD OWESTRY: 52% disability  TODAY'S TREATMENT:  DATE:  02/26/23 NuStep L5 x 6 min Hip add ball squeeze 2x10 LAQ 2lb 2x10 Seated march 2lb 2x10  HS curls green 2x10 S2S x5  02/24/23 UBE L1 x 2 min each some R arm discomfort NuStep L4 x 4 min S2S holding yellow ball 2x8 min assist at times  Rows & Ext green 2x10 Bridges x10 SLR x 10 each  02/17/23 NuStep L4 x 5 minutes Seated hip stretching- HS, glut stretch, figure 4, 2 x 15 sec each  Seated hip abd against G tband, 2 x 10 reps Seated rotational walk out and back with hands for trunk stretch and strengthening, 5x to each side. Standing heel raises. X 10  02/10/23 NuStep L5 x 7 min S2S 2x10 LAQ 2lb 2x10  Seated march 2lb 2x10 Standing shoulder flex 2lb WaTE for lumbar 2x10  Stair training 4 & 6 inch step 2 rails maintaining alternating pattern  PATIENT EDUCATION:  Education details: POC Person educated: Patient Education method: Explanation Education comprehension: verbalized understanding  HOME EXERCISE PROGRAM: Access Code: [email or web address] URL: [email or web address] Date: 02/10/2023 Prepared by: Debroah Baller  Exercises - Sit to Stand with Armchair  - 1 x daily - 7 x weekly - 3 sets - 10 reps - Seated Long Arc Quad  - 1 x daily - 7 x weekly - 3 sets - 10 reps - Seated March  - 1 x daily - 7 x weekly - 3 sets - 10 reps  ASSESSMENT:  CLINICAL IMPRESSION: Pt enters with reports of fatigue, so session backed down. Cues to complete full ROM needed with leg curls and extensions. Some difficuty with sit to stands. No pain throughout session.  OBJECTIVE IMPAIRMENTS: Abnormal gait, decreased activity tolerance, decreased balance, decreased endurance, decreased mobility, difficulty walking, decreased ROM, decreased strength, increased muscle spasms, and pain.   ACTIVITY LIMITATIONS: carrying, lifting, bending, standing, squatting, stairs, and locomotion level  PARTICIPATION  LIMITATIONS: meal prep, cleaning, laundry, shopping, and community activity  PERSONAL FACTORS: Past/current experiences are also affecting patient's functional outcome.   REHAB POTENTIAL: Good  CLINICAL DECISION MAKING: Stable/uncomplicated  EVALUATION COMPLEXITY: Moderate   GOALS: Goals reviewed with patient? Yes  SHORT TERM GOALS: Target date: 02/07/23  I with initial HEP Baseline:  Goal status: 02/17/23-met  LONG TERM GOALS: Target date: 04/02/23  I with final HEP Baseline:  Goal status: INITIAL  2.  Improve Owestry score to < 25% for minimal disability Baseline: 52% Goal status: Ongoing  3.  Patient will demonstrate full, pain free ROM of B shoulders and hips Baseline: Movement causes back pain Goal status: INITIAL  4.  Increase B hip strength to at least 4/5  Baseline: 3+/5 Goal status: INITIAL  5.  Patient will score at least 24/30 on FGA Baseline: TBD Goal status: INITIAL  6.  Patient will safely ambulate up and down at least 12 step using rail and S. Baseline: up to min A on steps Goal status: ongoing    PLAN:  PT FREQUENCY: 1-2x/week  PT DURATION: 10 weeks  PLANNED INTERVENTIONS: Therapeutic exercises, Therapeutic activity, Neuromuscular re-education, Balance training, Gait training, Patient/Family education, Self Care, Joint mobilization, Stair training, Dry Needling, Electrical stimulation, Spinal mobilization, Cryotherapy, Moist heat, Ionotophoresis 4mg /ml Dexamethasone, and Manual therapy  PLAN FOR NEXT SESSION: Complete 5 x STS, FGA, initiate HEP for stretch and strengthening of Upper and Lower trunk  Oley Balm DPT 02/26/23 2:36 PM

## 2023-02-27 DIAGNOSIS — N186 End stage renal disease: Secondary | ICD-10-CM | POA: Diagnosis not present

## 2023-02-27 DIAGNOSIS — N2581 Secondary hyperparathyroidism of renal origin: Secondary | ICD-10-CM | POA: Diagnosis not present

## 2023-02-27 DIAGNOSIS — Z992 Dependence on renal dialysis: Secondary | ICD-10-CM | POA: Diagnosis not present

## 2023-03-02 DIAGNOSIS — N186 End stage renal disease: Secondary | ICD-10-CM | POA: Diagnosis not present

## 2023-03-02 DIAGNOSIS — Z992 Dependence on renal dialysis: Secondary | ICD-10-CM | POA: Diagnosis not present

## 2023-03-02 DIAGNOSIS — N2581 Secondary hyperparathyroidism of renal origin: Secondary | ICD-10-CM | POA: Diagnosis not present

## 2023-03-03 ENCOUNTER — Other Ambulatory Visit (HOSPITAL_COMMUNITY): Payer: Self-pay

## 2023-03-03 ENCOUNTER — Ambulatory Visit: Payer: Medicare HMO | Admitting: Physical Therapy

## 2023-03-03 ENCOUNTER — Other Ambulatory Visit: Payer: Self-pay | Admitting: Family Medicine

## 2023-03-03 ENCOUNTER — Other Ambulatory Visit: Payer: Self-pay

## 2023-03-03 ENCOUNTER — Encounter: Payer: Self-pay | Admitting: Physical Therapy

## 2023-03-03 DIAGNOSIS — M25511 Pain in right shoulder: Secondary | ICD-10-CM | POA: Diagnosis not present

## 2023-03-03 DIAGNOSIS — M62838 Other muscle spasm: Secondary | ICD-10-CM | POA: Diagnosis not present

## 2023-03-03 DIAGNOSIS — E1122 Type 2 diabetes mellitus with diabetic chronic kidney disease: Secondary | ICD-10-CM

## 2023-03-03 DIAGNOSIS — M6281 Muscle weakness (generalized): Secondary | ICD-10-CM | POA: Diagnosis not present

## 2023-03-03 DIAGNOSIS — R262 Difficulty in walking, not elsewhere classified: Secondary | ICD-10-CM | POA: Diagnosis not present

## 2023-03-03 DIAGNOSIS — M5459 Other low back pain: Secondary | ICD-10-CM | POA: Diagnosis not present

## 2023-03-03 DIAGNOSIS — E1149 Type 2 diabetes mellitus with other diabetic neurological complication: Secondary | ICD-10-CM

## 2023-03-03 NOTE — Therapy (Signed)
OUTPATIENT PHYSICAL THERAPY CERVICAL EVALUATION   Patient Name: Paula [name] MRN: [id number] DOB:01-15-1946, 77 y.o., female Today's Date: 03/03/2023  END OF SESSION:  PT End of Session - 03/03/23 1433     Visit Number 8    Date for PT Re-Evaluation 04/02/23    PT Start Time 1433    PT Stop Time 1515    PT Time Calculation (min) 42 min    Activity Tolerance Patient tolerated treatment well    Behavior During Therapy [facility] for tasks assessed/performed             Past Medical History:  Diagnosis Date   Allergic rhinitis    Arthritis    Asthma    Brain aneurysm    Chronic kidney disease    Cough    Diabetes mellitus    Diastolic CHF, chronic (HCC) 10/11/2011   GERD (gastroesophageal reflux disease)    History of colon polyps 2012   tubular adenoma    Hyperlipidemia    Hypertension    Neuropathy 10/11/2011   PONV (postoperative nausea and vomiting)    one time after lymph node surgery   Past Surgical History:  Procedure Laterality Date   AV FISTULA PLACEMENT Right 10/15/2021   Procedure: ARTERIOVENOUS (AV) FISTULA  WITH PLACEMENT OF GORE-TEX STRETCH  GRAFT (4-24mmx45cm);  Surgeon: Chuck Hint, MD;  Location: [facility] OR;  Service: Vascular;  Laterality: Right;   CATARACT EXTRACTION     right eye   IR 3D INDEPENDENT WKST  12/14/2017   IR ANGIO INTRA EXTRACRAN SEL COM CAROTID INNOMINATE BILAT MOD SED  09/15/2017   IR ANGIO INTRA EXTRACRAN SEL COM CAROTID INNOMINATE UNI L MOD SED  12/14/2017   IR ANGIO VERTEBRAL SEL VERTEBRAL BILAT MOD SED  09/15/2017   IR RADIOLOGIST EVAL & MGMT  09/10/2017   IR RADIOLOGIST EVAL & MGMT  10/19/2017   lymphatic mass surgery     NASAL TURBINATE REDUCTION     RADIOLOGY WITH ANESTHESIA N/A 12/14/2017   Procedure: RADIOLOGY WITH ANESTHESIA EMBOLIZATION;  Surgeon: Julieanne Cotton, MD;  Location: [facility] OR;  Service: Radiology;  Laterality: N/A;   VIDEO BRONCHOSCOPY Bilateral 11/18/2017   Procedure: VIDEO BRONCHOSCOPY WITHOUT  FLUORO;  Surgeon: Leslye Peer, MD;  Location: [facility] ENDOSCOPY;  Service: Cardiopulmonary;  Laterality: Bilateral;   Patient Active Problem List   Diagnosis Date Noted   SNHL (sensory-neural hearing loss), 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   ESRD on dialysis (HCC) 11/20/2022   Mold exposure 08/14/2022   Opioid withdrawal (HCC) 08/14/2022   ILD (interstitial lung disease) (HCC) 08/14/2022   Secondary hyperparathyroidism, renal (HCC) 08/14/2022   Paroxysmal A-fib (HCC) 05/20/2022   Hypertrophy of nasal turbinates 09/05/2021   Chronic kidney disease, stage 5 (HCC)    Aortic atherosclerosis (HCC) 06/24/2021   Chronic rhinitis 10/01/2017   Seasonal allergic conjunctivitis 10/01/2017   Aneurysm (HCC) 09/24/2017   Acute metabolic encephalopathy 09/02/2017   Allergic rhinitis 08/20/2017   Palpitations 07/10/2016   Gout 01/23/2016   History of cataract 02/27/2015   Seasonal and perennial allergic rhinitis 02/15/2012   Chronic diastolic CHF (congestive heart failure) (HCC) 10/11/2011   GERD (gastroesophageal reflux disease)    Type 2 diabetes mellitus without complication, without long-term current use of insulin (HCC) 07/03/2010   Hyperlipidemia 07/03/2010   Essential hypertension 07/03/2010    PCP: Hoy Register, MD  REFERRING PROVIDER: Hoy Register, MD  REFERRING DIAG: muscle spasm  THERAPY DIAG:  Muscle weakness (generalized)  Difficulty in walking, not elsewhere classified  Other low  back pain  Other muscle spasm  Rationale for Evaluation and Treatment: Rehabilitation  ONSET DATE: 01/08/23  SUBJECTIVE:                                                                                                                                                                                                         SUBJECTIVE STATEMENT: Feeling stronger  PERTINENT HISTORY:  Per referring physician notes: Cerv muscle spasm, mostly occurs during dialysis-started on muscle relaxer, B  chronic knee pain Recent fall, landed on R knee-ENT consult pending.   PAIN:  Are you having pain? Yes: NPRS scale: 2/10 Pain location: back, hips, L knee neck, R shoulder Pain description: it hurts Aggravating factors: standing  Relieving factors: sit down, Tylenol  PRECAUTIONS: Fall  WEIGHT BEARING RESTRICTIONS: No  FALLS:  Has patient fallen in last 6 months? Yes. Number of falls 1  LIVING ENVIRONMENT: Lives with: lives with their family Lives in: House/apartment Stairs: Yes: External: 14 steps; bilateral but cannot reach both Having difficulty on steps Has following equipment at home: Single point cane and Walker - 2 wheeled  OCCUPATION: N/A  PLOF: Independent  PATIENT GOALS: Be able to stand and move on her own without pain  NEXT MD VISIT: August  OBJECTIVE:   DIAGNOSTIC FINDINGS:  X rays for L knee ordered.  COGNITION: Overall cognitive status: Within functional limits for tasks assessed  SENSATION: Patient reports episode of L fingers tingling during dialysis  POSTURE: rounded shoulders  PALPATION: TTP B piriformis, mildly over glut origins B, mildly in R shoulder, ant, supraspinatus tendon   CERVICAL ROM: moderately limited in B lateral flex with C/O pain in L neck   UPPER EXTREMITY ROM: Shoulder flex limited B due to C/O back pain.   UPPER EXTREMITY MMT: limited testing due to pain in back with resistance at shoulders. B elbows 4/5  CERVICAL SPECIAL TESTS:  Spurling's test: Negative  LUMBAR ROM: moderately limited in all planes due to back pain  LE ROM AND STRENGTH: SLR 60 degrees L, 70 degrees R, Knee to chest on L WNL, but pulls on ant R hip, B ER mildly limited, otherwise ROM WFL. Strength- 4-/5 B, except hip ext, abd 3+/5 FUNCTIONAL TESTS:  5 times sit to stand: 19.44 Functional gait assessment: TBD OWESTRY: 52% disability  TODAY'S TREATMENT:  DATE:  03/03/23 NuStep L5 x 6 min CHECKED GOALS S2S holding yellow ball HS curls 20lb 2x10 Leg Ext 5lb 2x10   02/26/23 NuStep L5 x 6 min Hip add ball squeeze 2x10 LAQ 2lb 2x10 Seated march 2lb 2x10  HS curls green 2x10 S2S x5  02/24/23 UBE L1 x 2 min each some R arm discomfort NuStep L4 x 4 min S2S holding yellow ball 2x8 min assist at times  Rows & Ext green 2x10 Bridges x10 SLR x 10 each  02/17/23 NuStep L4 x 5 minutes Seated hip stretching- HS, glut stretch, figure 4, 2 x 15 sec each  Seated hip abd against G tband, 2 x 10 reps Seated rotational walk out and back with hands for trunk stretch and strengthening, 5x to each side. Standing heel raises. X 10  02/10/23 NuStep L5 x 7 min S2S 2x10 LAQ 2lb 2x10  Seated march 2lb 2x10 Standing shoulder flex 2lb WaTE for lumbar 2x10  Stair training 4 & 6 inch step 2 rails maintaining alternating pattern  PATIENT EDUCATION:  Education details: POC Person educated: Patient Education method: Explanation Education comprehension: verbalized understanding  HOME EXERCISE PROGRAM: Access Code: [email or web address] URL: [email or web address] Date: 02/10/2023 Prepared by: Debroah Baller  Exercises - Sit to Stand with Armchair  - 1 x daily - 7 x weekly - 3 sets - 10 reps - Seated Long Arc Quad  - 1 x daily - 7 x weekly - 3 sets - 10 reps - Seated March  - 1 x daily - 7 x weekly - 3 sets - 10 reps  ASSESSMENT:  CLINICAL IMPRESSION: Pt enters doing well reporting increase strength. She has progressed towards goals increasing her functional ability. Cue for full ROM needed with leg curl. Some fatigue with sit to stands.  OBJECTIVE IMPAIRMENTS: Abnormal gait, decreased activity tolerance, decreased balance, decreased endurance, decreased mobility, difficulty walking, decreased ROM, decreased strength, increased muscle spasms, and pain.   ACTIVITY LIMITATIONS: carrying, lifting, bending, standing,  squatting, stairs, and locomotion level  PARTICIPATION LIMITATIONS: meal prep, cleaning, laundry, shopping, and community activity  PERSONAL FACTORS: Past/current experiences are also affecting patient's functional outcome.   REHAB POTENTIAL: Good  CLINICAL DECISION MAKING: Stable/uncomplicated  EVALUATION COMPLEXITY: Moderate   GOALS: Goals reviewed with patient? Yes  SHORT TERM GOALS: Target date: 02/07/23  I with initial HEP Baseline:  Goal status: 02/17/23-met  LONG TERM GOALS: Target date: 04/02/23  I with final HEP Baseline:  Goal status: INITIAL  2.  Improve Owestry score to < 25% for minimal disability Baseline: 52% Goal status: Progressing 39%  3.  Patient will demonstrate full, pain free ROM of B shoulders and hips Baseline: Movement causes back pain Goal status: INITIAL  4.  Increase B hip strength to at least 4/5  Baseline: 3+/5 Goal status: INITIAL  5.  Patient will score at least 24/30 on FGA Baseline: TBD Goal status: INITIAL  6.  Patient will safely ambulate up and down at least 12 step using rail and S. Baseline: up to min A on steps Goal status: Met 03/03/23    PLAN:  PT FREQUENCY: 1-2x/week  PT DURATION: 10 weeks  PLANNED INTERVENTIONS: Therapeutic exercises, Therapeutic activity, Neuromuscular re-education, Balance training, Gait training, Patient/Family education, Self Care, Joint mobilization, Stair training, Dry Needling, Electrical stimulation, Spinal mobilization, Cryotherapy, Moist heat, Ionotophoresis 4mg /ml Dexamethasone, and Manual therapy  PLAN FOR NEXT SESSION: Complete 5 x STS, FGA, initiate HEP for stretch and strengthening of Upper and Lower trunk  Oley Balm DPT  03/03/23 2:33 PM

## 2023-03-04 ENCOUNTER — Other Ambulatory Visit (HOSPITAL_COMMUNITY): Payer: Self-pay

## 2023-03-04 ENCOUNTER — Other Ambulatory Visit: Payer: Self-pay

## 2023-03-04 DIAGNOSIS — N186 End stage renal disease: Secondary | ICD-10-CM | POA: Diagnosis not present

## 2023-03-04 DIAGNOSIS — N2581 Secondary hyperparathyroidism of renal origin: Secondary | ICD-10-CM | POA: Diagnosis not present

## 2023-03-04 DIAGNOSIS — Z992 Dependence on renal dialysis: Secondary | ICD-10-CM | POA: Diagnosis not present

## 2023-03-04 MED ORDER — ATORVASTATIN CALCIUM 80 MG PO TABS
80.0000 mg | ORAL_TABLET | Freq: Every day | ORAL | 0 refills | Status: DC
Start: 2023-03-04 — End: 2023-04-15
  Filled 2023-03-04: qty 90, 90d supply, fill #0

## 2023-03-04 MED ORDER — ALBUTEROL SULFATE (2.5 MG/3ML) 0.083% IN NEBU
2.5000 mg | INHALATION_SOLUTION | Freq: Four times a day (QID) | RESPIRATORY_TRACT | 1 refills | Status: DC | PRN
  Filled 2023-03-04: qty 90, 8d supply, fill #0
  Filled 2023-05-12: qty 90, 8d supply, fill #1

## 2023-03-04 MED ORDER — ISOSORBIDE MONONITRATE ER 60 MG PO TB24
60.0000 mg | ORAL_TABLET | Freq: Every day | ORAL | 0 refills | Status: DC
  Filled 2023-03-04: qty 90, 90d supply, fill #0

## 2023-03-04 MED ORDER — GLIPIZIDE ER 2.5 MG PO TB24
2.5000 mg | ORAL_TABLET | Freq: Every day | ORAL | 0 refills | Status: DC
  Filled 2023-03-04: qty 90, 90d supply, fill #0

## 2023-03-04 MED ORDER — ALBUTEROL SULFATE HFA 108 (90 BASE) MCG/ACT IN AERS
1.0000 | INHALATION_SPRAY | Freq: Four times a day (QID) | RESPIRATORY_TRACT | 2 refills | Status: DC | PRN
  Filled 2023-03-04: qty 6.7, 25d supply, fill #0

## 2023-03-05 ENCOUNTER — Other Ambulatory Visit: Payer: Self-pay

## 2023-03-05 ENCOUNTER — Ambulatory Visit: Payer: Medicare HMO | Admitting: Physical Therapy

## 2023-03-06 ENCOUNTER — Other Ambulatory Visit (HOSPITAL_COMMUNITY): Payer: Self-pay

## 2023-03-06 DIAGNOSIS — N2581 Secondary hyperparathyroidism of renal origin: Secondary | ICD-10-CM | POA: Diagnosis not present

## 2023-03-06 DIAGNOSIS — Z992 Dependence on renal dialysis: Secondary | ICD-10-CM | POA: Diagnosis not present

## 2023-03-06 DIAGNOSIS — N186 End stage renal disease: Secondary | ICD-10-CM | POA: Diagnosis not present

## 2023-03-09 DIAGNOSIS — N2581 Secondary hyperparathyroidism of renal origin: Secondary | ICD-10-CM | POA: Diagnosis not present

## 2023-03-09 DIAGNOSIS — N186 End stage renal disease: Secondary | ICD-10-CM | POA: Diagnosis not present

## 2023-03-09 DIAGNOSIS — Z992 Dependence on renal dialysis: Secondary | ICD-10-CM | POA: Diagnosis not present

## 2023-03-10 ENCOUNTER — Inpatient Hospital Stay: Admission: RE | Admit: 2023-03-10 | Payer: Medicare HMO | Source: Ambulatory Visit

## 2023-03-11 DIAGNOSIS — N2581 Secondary hyperparathyroidism of renal origin: Secondary | ICD-10-CM | POA: Diagnosis not present

## 2023-03-11 DIAGNOSIS — Z992 Dependence on renal dialysis: Secondary | ICD-10-CM | POA: Diagnosis not present

## 2023-03-11 DIAGNOSIS — I129 Hypertensive chronic kidney disease with stage 1 through stage 4 chronic kidney disease, or unspecified chronic kidney disease: Secondary | ICD-10-CM | POA: Diagnosis not present

## 2023-03-11 DIAGNOSIS — N186 End stage renal disease: Secondary | ICD-10-CM | POA: Diagnosis not present

## 2023-03-12 ENCOUNTER — Ambulatory Visit: Payer: Medicare HMO | Attending: Family Medicine | Admitting: Physical Therapy

## 2023-03-12 ENCOUNTER — Encounter: Payer: Self-pay | Admitting: Physical Therapy

## 2023-03-12 DIAGNOSIS — M25511 Pain in right shoulder: Secondary | ICD-10-CM

## 2023-03-12 DIAGNOSIS — M62838 Other muscle spasm: Secondary | ICD-10-CM | POA: Diagnosis not present

## 2023-03-12 DIAGNOSIS — M5459 Other low back pain: Secondary | ICD-10-CM

## 2023-03-12 DIAGNOSIS — M6281 Muscle weakness (generalized): Secondary | ICD-10-CM

## 2023-03-12 DIAGNOSIS — R262 Difficulty in walking, not elsewhere classified: Secondary | ICD-10-CM

## 2023-03-12 NOTE — Therapy (Signed)
OUTPATIENT PHYSICAL THERAPY CERVICAL EVALUATION   Patient Name: Paula [name] MRN: [id number] DOB:08/27/1945, 77 y.o., female Today's Date: 03/12/2023  END OF SESSION:  PT End of Session - 03/12/23 1327     Visit Number 9    Date for PT Re-Evaluation 04/02/23    PT Start Time 1320    PT Stop Time 1358    PT Time Calculation (min) 38 min    Activity Tolerance Patient tolerated treatment well    Behavior During Therapy [facility] for tasks assessed/performed              Past Medical History:  Diagnosis Date   Allergic rhinitis    Arthritis    Asthma    Brain aneurysm    Chronic kidney disease    Cough    Diabetes mellitus    Diastolic CHF, chronic (HCC) 10/11/2011   GERD (gastroesophageal reflux disease)    History of colon polyps 2012   tubular adenoma    Hyperlipidemia    Hypertension    Neuropathy 10/11/2011   PONV (postoperative nausea and vomiting)    one time after lymph node surgery   Past Surgical History:  Procedure Laterality Date   AV FISTULA PLACEMENT Right 10/15/2021   Procedure: ARTERIOVENOUS (AV) FISTULA  WITH PLACEMENT OF GORE-TEX STRETCH  GRAFT (4-26mmx45cm);  Surgeon: Chuck Hint, MD;  Location: [facility] OR;  Service: Vascular;  Laterality: Right;   CATARACT EXTRACTION     right eye   IR 3D INDEPENDENT WKST  12/14/2017   IR ANGIO INTRA EXTRACRAN SEL COM CAROTID INNOMINATE BILAT MOD SED  09/15/2017   IR ANGIO INTRA EXTRACRAN SEL COM CAROTID INNOMINATE UNI L MOD SED  12/14/2017   IR ANGIO VERTEBRAL SEL VERTEBRAL BILAT MOD SED  09/15/2017   IR RADIOLOGIST EVAL & MGMT  09/10/2017   IR RADIOLOGIST EVAL & MGMT  10/19/2017   lymphatic mass surgery     NASAL TURBINATE REDUCTION     RADIOLOGY WITH ANESTHESIA N/A 12/14/2017   Procedure: RADIOLOGY WITH ANESTHESIA EMBOLIZATION;  Surgeon: Julieanne Cotton, MD;  Location: [facility] OR;  Service: Radiology;  Laterality: N/A;   VIDEO BRONCHOSCOPY Bilateral 11/18/2017   Procedure: VIDEO BRONCHOSCOPY WITHOUT  FLUORO;  Surgeon: Leslye Peer, MD;  Location: [facility];  Service: Cardiopulmonary;  Laterality: Bilateral;   Patient Active Problem List   Diagnosis Date Noted   SNHL (sensory-neural hearing loss), 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   ESRD on dialysis (HCC) 11/20/2022   Mold exposure 08/14/2022   Opioid withdrawal (HCC) 08/14/2022   ILD (interstitial lung disease) (HCC) 08/14/2022   Secondary hyperparathyroidism, renal (HCC) 08/14/2022   Paroxysmal A-fib (HCC) 05/20/2022   Hypertrophy of nasal turbinates 09/05/2021   Chronic kidney disease, stage 5 (HCC)    Aortic atherosclerosis (HCC) 06/24/2021   Chronic rhinitis 10/01/2017   Seasonal allergic conjunctivitis 10/01/2017   Aneurysm (HCC) 09/24/2017   Acute metabolic encephalopathy 09/02/2017   Allergic rhinitis 08/20/2017   Palpitations 07/10/2016   Gout 01/23/2016   History of cataract 02/27/2015   Seasonal and perennial allergic rhinitis 02/15/2012   Chronic diastolic CHF (congestive heart failure) (HCC) 10/11/2011   GERD (gastroesophageal reflux disease)    Type 2 diabetes mellitus without complication, without long-term current use of insulin (HCC) 07/03/2010   Hyperlipidemia 07/03/2010   Essential hypertension 07/03/2010    PCP: Hoy Register, MD  REFERRING PROVIDER: Hoy Register, MD  REFERRING DIAG: muscle spasm  THERAPY DIAG:  Muscle weakness (generalized)  Difficulty in walking, not elsewhere classified  Other  low back pain  Acute pain of right shoulder  Other muscle spasm  Rationale for Evaluation and Treatment: Rehabilitation  ONSET DATE: 01/08/23  SUBJECTIVE:                                                                                                                                                                                                         SUBJECTIVE STATEMENT: Feeling stronger  PERTINENT HISTORY:  Per referring physician notes: Cerv muscle spasm, mostly occurs during  dialysis-started on muscle relaxer, B chronic knee pain Recent fall, landed on R knee-ENT consult pending.   PAIN:  Are you having pain? Yes: NPRS scale: 2/10 Pain location: back, hips, L knee neck, R shoulder Pain description: it hurts Aggravating factors: standing  Relieving factors: sit down, Tylenol  PRECAUTIONS: Fall  WEIGHT BEARING RESTRICTIONS: No  FALLS:  Has patient fallen in last 6 months? Yes. Number of falls 1  LIVING ENVIRONMENT: Lives with: lives with their family Lives in: House/apartment Stairs: Yes: External: 14 steps; bilateral but cannot reach both Having difficulty on steps Has following equipment at home: Single point cane and Walker - 2 wheeled  OCCUPATION: N/A  PLOF: Independent  PATIENT GOALS: Be able to stand and move on her own without pain  NEXT MD VISIT: August  OBJECTIVE:   DIAGNOSTIC FINDINGS:  X rays for L knee ordered.  COGNITION: Overall cognitive status: Within functional limits for tasks assessed  SENSATION: Patient reports episode of L fingers tingling during dialysis  POSTURE: rounded shoulders  PALPATION: TTP B piriformis, mildly over glut origins B, mildly in R shoulder, ant, supraspinatus tendon   CERVICAL ROM: moderately limited in B lateral flex with C/O pain in L neck   UPPER EXTREMITY ROM: Shoulder flex limited B due to C/O back pain.   UPPER EXTREMITY MMT: limited testing due to pain in back with resistance at shoulders. B elbows 4/5  CERVICAL SPECIAL TESTS:  Spurling's test: Negative  LUMBAR ROM: moderately limited in all planes due to back pain  LE ROM AND STRENGTH: SLR 60 degrees L, 70 degrees R, Knee to chest on L WNL, but pulls on ant R hip, B ER mildly limited, otherwise ROM WFL. Strength- 4-/5 B, except hip ext, abd 3+/5 FUNCTIONAL TESTS:  5 times sit to stand: 19.44 Functional gait assessment: TBD OWESTRY: 52% disability  TODAY'S TREATMENT:  DATE:  03/12/23 NuStep L5 x 6 min Seated PPT to stretch low back Seated press ups with hands behind hips x 10. B side step against G tband resistance at knees Standing on airex, OHP with yellow ball x 10, occ CGA for safety Step ups onto 4" step, 10 2 x 5 reps each leg Heel raises on Black bar, x 10  03/03/23 NuStep L5 x 6 min CHECKED GOALS S2S holding yellow ball HS curls 20lb 2x10 Leg Ext 5lb 2x10  02/26/23 NuStep L5 x 6 min Hip add ball squeeze 2x10 LAQ 2lb 2x10 Seated march 2lb 2x10  HS curls green 2x10 S2S x5  02/24/23 UBE L1 x 2 min each some R arm discomfort NuStep L4 x 4 min S2S holding yellow ball 2x8 min assist at times  Rows & Ext green 2x10 Bridges x10 SLR x 10 each  02/17/23 NuStep L4 x 5 minutes Seated hip stretching- HS, glut stretch, figure 4, 2 x 15 sec each  Seated hip abd against G tband, 2 x 10 reps Seated rotational walk out and back with hands for trunk stretch and strengthening, 5x to each side. Standing heel raises. X 10  02/10/23 NuStep L5 x 7 min S2S 2x10 LAQ 2lb 2x10  Seated march 2lb 2x10 Standing shoulder flex 2lb WaTE for lumbar 2x10  Stair training 4 & 6 inch step 2 rails maintaining alternating pattern  PATIENT EDUCATION:  Education details: POC Person educated: Patient Education method: Explanation Education comprehension: verbalized understanding  HOME EXERCISE PROGRAM: Access Code: [email or web address] URL: [email or web address] Date: 02/10/2023 Prepared by: Debroah Baller  Exercises - Sit to Stand with Armchair  - 1 x daily - 7 x weekly - 3 sets - 10 reps - Seated Long Arc Quad  - 1 x daily - 7 x weekly - 3 sets - 10 reps - Seated March  - 1 x daily - 7 x weekly - 3 sets - 10 reps  ASSESSMENT:  CLINICAL IMPRESSION: Pt enters doing well. Reports sore back after standing about 1 min doing meal prep. Treatment focused on trunk stability and LE strength.  She tolerated increased activity with no C/O pain.  OBJECTIVE IMPAIRMENTS: Abnormal gait, decreased activity tolerance, decreased balance, decreased endurance, decreased mobility, difficulty walking, decreased ROM, decreased strength, increased muscle spasms, and pain.   ACTIVITY LIMITATIONS: carrying, lifting, bending, standing, squatting, stairs, and locomotion level  PARTICIPATION LIMITATIONS: meal prep, cleaning, laundry, shopping, and community activity  PERSONAL FACTORS: Past/current experiences are also affecting patient's functional outcome.   REHAB POTENTIAL: Good  CLINICAL DECISION MAKING: Stable/uncomplicated  EVALUATION COMPLEXITY: Moderate   GOALS: Goals reviewed with patient? Yes  SHORT TERM GOALS: Target date: 02/07/23  I with initial HEP Baseline:  Goal status: 02/17/23-met  LONG TERM GOALS: Target date: 04/02/23  I with final HEP Baseline:  Goal status: INITIAL  2.  Improve Owestry score to < 25% for minimal disability Baseline: 52% Goal status: Progressing 39%  3.  Patient will demonstrate full, pain free ROM of B shoulders and hips Baseline: Movement causes back pain Goal status: INITIAL  4.  Increase B hip strength to at least 4/5  Baseline: 3+/5 Goal status: 03/12/23-4-/5, ongoing  5.  Patient will score at least 24/30 on FGA Baseline: TBD Goal status: INITIAL  6.  Patient will safely ambulate up and down at least 12 step using rail and S. Baseline: up to min A on steps Goal status: Met 03/03/23    PLAN:  PT FREQUENCY: 1-2x/week  PT  DURATION: 10 weeks  PLANNED INTERVENTIONS: Therapeutic exercises, Therapeutic activity, Neuromuscular re-education, Balance training, Gait training, Patient/Family education, Self Care, Joint mobilization, Stair training, Dry Needling, Electrical stimulation, Spinal mobilization, Cryotherapy, Moist heat, Ionotophoresis 4mg /ml Dexamethasone, and Manual therapy  PLAN FOR NEXT SESSION: Complete 5 x STS, FGA, initiate  HEP for stretch and strengthening of Upper and Lower trunk  Oley Balm DPT 03/12/23 2:00 PM

## 2023-03-13 DIAGNOSIS — Z992 Dependence on renal dialysis: Secondary | ICD-10-CM | POA: Diagnosis not present

## 2023-03-13 DIAGNOSIS — N186 End stage renal disease: Secondary | ICD-10-CM | POA: Diagnosis not present

## 2023-03-13 DIAGNOSIS — N2581 Secondary hyperparathyroidism of renal origin: Secondary | ICD-10-CM | POA: Diagnosis not present

## 2023-03-16 DIAGNOSIS — N2581 Secondary hyperparathyroidism of renal origin: Secondary | ICD-10-CM | POA: Diagnosis not present

## 2023-03-16 DIAGNOSIS — Z992 Dependence on renal dialysis: Secondary | ICD-10-CM | POA: Diagnosis not present

## 2023-03-16 DIAGNOSIS — N186 End stage renal disease: Secondary | ICD-10-CM | POA: Diagnosis not present

## 2023-03-17 ENCOUNTER — Encounter: Payer: Self-pay | Admitting: Physical Therapy

## 2023-03-17 ENCOUNTER — Ambulatory Visit: Payer: Medicare HMO | Admitting: Physical Therapy

## 2023-03-17 DIAGNOSIS — M5459 Other low back pain: Secondary | ICD-10-CM | POA: Diagnosis not present

## 2023-03-17 DIAGNOSIS — R262 Difficulty in walking, not elsewhere classified: Secondary | ICD-10-CM

## 2023-03-17 DIAGNOSIS — M62838 Other muscle spasm: Secondary | ICD-10-CM

## 2023-03-17 DIAGNOSIS — M6281 Muscle weakness (generalized): Secondary | ICD-10-CM | POA: Diagnosis not present

## 2023-03-17 DIAGNOSIS — M25511 Pain in right shoulder: Secondary | ICD-10-CM

## 2023-03-17 NOTE — Therapy (Signed)
OUTPATIENT PHYSICAL THERAPY CERVICAL EVALUATION  Progress Note Reporting Period 01/22/23 to 03/17/23  See note below for Objective Data and Assessment of Progress/Goals.    Patient Name: Paula [name] MRN: [id number] DOB:1946/08/03, 77 y.o., female Today's Date: 03/17/2023  END OF SESSION:  PT End of Session - 03/17/23 1308     Visit Number 10    Date for PT Re-Evaluation 04/02/23    PT Start Time 1304    PT Stop Time 1345    PT Time Calculation (min) 41 min    Activity Tolerance Patient tolerated treatment well    Behavior During Therapy [facility] for tasks assessed/performed              Past Medical History:  Diagnosis Date   Allergic rhinitis    Arthritis    Asthma    Brain aneurysm    Chronic kidney disease    Cough    Diabetes mellitus    Diastolic CHF, chronic (HCC) 10/11/2011   GERD (gastroesophageal reflux disease)    History of colon polyps 2012   tubular adenoma    Hyperlipidemia    Hypertension    Neuropathy 10/11/2011   PONV (postoperative nausea and vomiting)    one time after lymph node surgery   Past Surgical History:  Procedure Laterality Date   AV FISTULA PLACEMENT Right 10/15/2021   Procedure: ARTERIOVENOUS (AV) FISTULA  WITH PLACEMENT OF GORE-TEX STRETCH  GRAFT (4-57mmx45cm);  Surgeon: Chuck Hint, MD;  Location: [facility] OR;  Service: Vascular;  Laterality: Right;   CATARACT EXTRACTION     right eye   IR 3D INDEPENDENT WKST  12/14/2017   IR ANGIO INTRA EXTRACRAN SEL COM CAROTID INNOMINATE BILAT MOD SED  09/15/2017   IR ANGIO INTRA EXTRACRAN SEL COM CAROTID INNOMINATE UNI L MOD SED  12/14/2017   IR ANGIO VERTEBRAL SEL VERTEBRAL BILAT MOD SED  09/15/2017   IR RADIOLOGIST EVAL & MGMT  09/10/2017   IR RADIOLOGIST EVAL & MGMT  10/19/2017   lymphatic mass surgery     NASAL TURBINATE REDUCTION     RADIOLOGY WITH ANESTHESIA N/A 12/14/2017   Procedure: RADIOLOGY WITH ANESTHESIA EMBOLIZATION;  Surgeon: Julieanne Cotton, MD;  Location: [facility] OR;   Service: Radiology;  Laterality: N/A;   VIDEO BRONCHOSCOPY Bilateral 11/18/2017   Procedure: VIDEO BRONCHOSCOPY WITHOUT FLUORO;  Surgeon: Leslye Peer, MD;  Location: [facility]COPY;  Service: Cardiopulmonary;  Laterality: Bilateral;   Patient Active Problem List   Diagnosis Date Noted   SNHL (sensory-neural hearing loss), 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   ESRD on dialysis (HCC) 11/20/2022   Mold exposure 08/14/2022   Opioid withdrawal (HCC) 08/14/2022   ILD (interstitial lung disease) (HCC) 08/14/2022   Secondary hyperparathyroidism, renal (HCC) 08/14/2022   Paroxysmal A-fib (HCC) 05/20/2022   Hypertrophy of nasal turbinates 09/05/2021   Chronic kidney disease, stage 5 (HCC)    Aortic atherosclerosis (HCC) 06/24/2021   Chronic rhinitis 10/01/2017   Seasonal allergic conjunctivitis 10/01/2017   Aneurysm (HCC) 09/24/2017   Acute metabolic encephalopathy 09/02/2017   Allergic rhinitis 08/20/2017   Palpitations 07/10/2016   Gout 01/23/2016   History of cataract 02/27/2015   Seasonal and perennial allergic rhinitis 02/15/2012   Chronic diastolic CHF (congestive heart failure) (HCC) 10/11/2011   GERD (gastroesophageal reflux disease)    Type 2 diabetes mellitus without complication, without long-term current use of insulin (HCC) 07/03/2010   Hyperlipidemia 07/03/2010   Essential hypertension 07/03/2010    PCP: Hoy Register, MD  REFERRING PROVIDER: Hoy Register, MD  REFERRING DIAG: muscle spasm  THERAPY DIAG:  Muscle weakness (generalized)  Difficulty in walking, not elsewhere classified  Acute pain of right shoulder  Other muscle spasm  Other low back pain  Rationale for Evaluation and Treatment: Rehabilitation  ONSET DATE: 01/08/23  SUBJECTIVE:                                                                                                                                                                                                         SUBJECTIVE STATEMENT: Had  a fall yesterday, no injuries, able to get up by herself   PERTINENT HISTORY:  Per referring physician notes: Cerv muscle spasm, mostly occurs during dialysis-started on muscle relaxer, B chronic knee pain Recent fall, landed on R knee-ENT consult pending.   PAIN:  Are you having pain? Yes: NPRS scale: 2/10 Pain location: back, hips, L knee neck, R shoulder Pain description: it hurts Aggravating factors: standing  Relieving factors: sit down, Tylenol  PRECAUTIONS: Fall  WEIGHT BEARING RESTRICTIONS: No  FALLS:  Has patient fallen in last 6 months? Yes. Number of falls 1  LIVING ENVIRONMENT: Lives with: lives with their family Lives in: House/apartment Stairs: Yes: External: 14 steps; bilateral but cannot reach both Having difficulty on steps Has following equipment at home: Single point cane and Walker - 2 wheeled  OCCUPATION: N/A  PLOF: Independent  PATIENT GOALS: Be able to stand and move on her own without pain  NEXT MD VISIT: August  OBJECTIVE:   DIAGNOSTIC FINDINGS:  X rays for L knee ordered.  COGNITION: Overall cognitive status: Within functional limits for tasks assessed  SENSATION: Patient reports episode of L fingers tingling during dialysis  POSTURE: rounded shoulders  PALPATION: TTP B piriformis, mildly over glut origins B, mildly in R shoulder, ant, supraspinatus tendon   CERVICAL ROM: moderately limited in B lateral flex with C/O pain in L neck   UPPER EXTREMITY ROM: Shoulder flex limited B due to C/O back pain.   UPPER EXTREMITY MMT: limited testing due to pain in back with resistance at shoulders. B elbows 4/5  CERVICAL SPECIAL TESTS:  Spurling's test: Negative  LUMBAR ROM: moderately limited in all planes due to back pain  LE ROM AND STRENGTH: SLR 60 degrees L, 70 degrees R, Knee to chest on L WNL, but pulls on ant R hip, B ER mildly limited, otherwise ROM WFL. Strength- 4-/5 B, except hip ext, abd 3+/5 FUNCTIONAL TESTS:  5 times sit  to stand: 19.44 Functional gait assessment: TBD OWESTRY: 52% disability  TODAY'S TREATMENT:  DATE:  03/17/23 NuStep L5 x 6 min. S2S OHP red ball 2x10  6in step ups rails x 2 2x10 each On airex standing shouldr flex 1lb 2x10 On airex standing shoulder Ext red 2x10 HS curls 20lb 2x10   03/12/23 NuStep L5 x 6 min Seated PPT to stretch low back Seated press ups with hands behind hips x 10. B side step against G tband resistance at knees Standing on airex, OHP with yellow ball x 10, occ CGA for safety Step ups onto 4" step, 10 2 x 5 reps each leg Heel raises on Black bar, x 10  03/03/23 NuStep L5 x 6 min CHECKED GOALS S2S holding yellow ball HS curls 20lb 2x10 Leg Ext 5lb 2x10  02/26/23 NuStep L5 x 6 min Hip add ball squeeze 2x10 LAQ 2lb 2x10 Seated march 2lb 2x10  HS curls green 2x10 S2S x5  02/24/23 UBE L1 x 2 min each some R arm discomfort NuStep L4 x 4 min S2S holding yellow ball 2x8 min assist at times  Rows & Ext green 2x10 Bridges x10 SLR x 10 each  02/17/23 NuStep L4 x 5 minutes Seated hip stretching- HS, glut stretch, figure 4, 2 x 15 sec each  Seated hip abd against G tband, 2 x 10 reps Seated rotational walk out and back with hands for trunk stretch and strengthening, 5x to each side. Standing heel raises. X 10  02/10/23 NuStep L5 x 7 min S2S 2x10 LAQ 2lb 2x10  Seated march 2lb 2x10 Standing shoulder flex 2lb WaTE for lumbar 2x10  Stair training 4 & 6 inch step 2 rails maintaining alternating pattern  PATIENT EDUCATION:  Education details: POC Person educated: Patient Education method: Explanation Education comprehension: verbalized understanding  HOME EXERCISE PROGRAM: Access Code: [email or web address] URL: [email or web address] Date: 02/10/2023 Prepared by: Debroah Baller  Exercises - Sit to Stand with Armchair  - 1 x  daily - 7 x weekly - 3 sets - 10 reps - Seated Long Arc Quad  - 1 x daily - 7 x weekly - 3 sets - 10 reps - Seated March  - 1 x daily - 7 x weekly - 3 sets - 10 reps  ASSESSMENT:  CLINICAL IMPRESSION: Pt enters doing well. Treatment focused on trunk stability, balance, and LE strength. Added step ups to treatment session and this did cause some increase fatigue. Some instability noted with interventions on airex. Pt appears to be uncertain at times with gait, and she continues to have some weakness in both LE's. She tolerated increased activity with no C/O pain. Pt would benefit from continued skilled PT services to address deficits.  OBJECTIVE IMPAIRMENTS: Abnormal gait, decreased activity tolerance, decreased balance, decreased endurance, decreased mobility, difficulty walking, decreased ROM, decreased strength, increased muscle spasms, and pain.   ACTIVITY LIMITATIONS: carrying, lifting, bending, standing, squatting, stairs, and locomotion level  PARTICIPATION LIMITATIONS: meal prep, cleaning, laundry, shopping, and community activity  PERSONAL FACTORS: Past/current experiences are also affecting patient's functional outcome.   REHAB POTENTIAL: Good  CLINICAL DECISION MAKING: Stable/uncomplicated  EVALUATION COMPLEXITY: Moderate   GOALS: Goals reviewed with patient? Yes  SHORT TERM GOALS: Target date: 02/07/23  I with initial HEP Baseline:  Goal status: 02/17/23-met  LONG TERM GOALS: Target date: 04/02/23  I with final HEP Baseline:  Goal status: INITIAL  2.  Improve Owestry score to < 25% for minimal disability Baseline: 52% Goal status: Progressing 28% 03/17/23  3.  Patient will demonstrate full, pain free ROM of B shoulders and hips  Baseline: Movement causes back pain Goal status: INITIAL  4.  Increase B hip strength to at least 4/5  Baseline: 3+/5 Goal status: 03/12/23 -4-/5, progressing  5.  Patient will score at least 24/30 on FGA Baseline: TBD Goal status:  INITIAL  6.  Patient will safely ambulate up and down at least 12 step using rail and S. Baseline: up to min A on steps Goal status: Met 03/17/23    PLAN:  PT FREQUENCY: 1-2x/week  PT DURATION: 10 weeks  PLANNED INTERVENTIONS: Therapeutic exercises, Therapeutic activity, Neuromuscular re-education, Balance training, Gait training, Patient/Family education, Self Care, Joint mobilization, Stair training, Dry Needling, Electrical stimulation, Spinal mobilization, Cryotherapy, Moist heat, Ionotophoresis 4mg /ml Dexamethasone, and Manual therapy  PLAN FOR NEXT SESSION: Complete 5 x STS, FGA, initiate HEP for stretch and strengthening of Upper and Lower trunk  Oley Balm DPT 03/17/23 1:09 PM

## 2023-03-18 DIAGNOSIS — Z992 Dependence on renal dialysis: Secondary | ICD-10-CM | POA: Diagnosis not present

## 2023-03-18 DIAGNOSIS — N186 End stage renal disease: Secondary | ICD-10-CM | POA: Diagnosis not present

## 2023-03-18 DIAGNOSIS — N2581 Secondary hyperparathyroidism of renal origin: Secondary | ICD-10-CM | POA: Diagnosis not present

## 2023-03-20 DIAGNOSIS — N2581 Secondary hyperparathyroidism of renal origin: Secondary | ICD-10-CM | POA: Diagnosis not present

## 2023-03-20 DIAGNOSIS — N186 End stage renal disease: Secondary | ICD-10-CM | POA: Diagnosis not present

## 2023-03-20 DIAGNOSIS — Z992 Dependence on renal dialysis: Secondary | ICD-10-CM | POA: Diagnosis not present

## 2023-03-23 DIAGNOSIS — N2581 Secondary hyperparathyroidism of renal origin: Secondary | ICD-10-CM | POA: Diagnosis not present

## 2023-03-23 DIAGNOSIS — N186 End stage renal disease: Secondary | ICD-10-CM | POA: Diagnosis not present

## 2023-03-23 DIAGNOSIS — Z992 Dependence on renal dialysis: Secondary | ICD-10-CM | POA: Diagnosis not present

## 2023-03-24 ENCOUNTER — Encounter: Payer: Self-pay | Admitting: Physical Therapy

## 2023-03-24 ENCOUNTER — Ambulatory Visit: Payer: Medicare HMO | Admitting: Physical Therapy

## 2023-03-24 DIAGNOSIS — M25511 Pain in right shoulder: Secondary | ICD-10-CM

## 2023-03-24 DIAGNOSIS — M62838 Other muscle spasm: Secondary | ICD-10-CM | POA: Diagnosis not present

## 2023-03-24 DIAGNOSIS — M6281 Muscle weakness (generalized): Secondary | ICD-10-CM

## 2023-03-24 DIAGNOSIS — M5459 Other low back pain: Secondary | ICD-10-CM | POA: Diagnosis not present

## 2023-03-24 DIAGNOSIS — R262 Difficulty in walking, not elsewhere classified: Secondary | ICD-10-CM

## 2023-03-24 NOTE — Therapy (Signed)
OUTPATIENT PHYSICAL THERAPY CERVICAL EVALUATION    Patient Name: Paula [name] MRN: [id number] DOB:[birth year], 77 y.o., female Today's Date: 03/24/2023  END OF SESSION:  PT End of Session - 03/24/23 1319     Visit Number 11    Date for PT Re-Evaluation 04/02/23    PT Start Time 1318    PT Stop Time 1345    PT Time Calculation (min) 27 min    Activity Tolerance Patient tolerated treatment well    Behavior During Therapy [facility] for tasks assessed/performed              Past Medical History:  Diagnosis Date   Allergic rhinitis    Arthritis    Asthma    Brain aneurysm    Chronic kidney disease    Cough    Diabetes mellitus    Diastolic CHF, chronic (HCC) 10/11/2011   GERD (gastroesophageal reflux disease)    History of colon polyps 2012   tubular adenoma    Hyperlipidemia    Hypertension    Neuropathy 10/11/2011   PONV (postoperative nausea and vomiting)    one time after lymph node surgery   Past Surgical History:  Procedure Laterality Date   AV FISTULA PLACEMENT Right 10/15/2021   Procedure: ARTERIOVENOUS (AV) FISTULA  WITH PLACEMENT OF GORE-TEX STRETCH  GRAFT (4-80mmx45cm);  Surgeon: Chuck Hint, MD;  Location: [facility] OR;  Service: Vascular;  Laterality: Right;   CATARACT EXTRACTION     right eye   IR 3D INDEPENDENT WKST  12/14/2017   IR ANGIO INTRA EXTRACRAN SEL COM CAROTID INNOMINATE BILAT MOD SED  09/15/2017   IR ANGIO INTRA EXTRACRAN SEL COM CAROTID INNOMINATE UNI L MOD SED  12/14/2017   IR ANGIO VERTEBRAL SEL VERTEBRAL BILAT MOD SED  09/15/2017   IR RADIOLOGIST EVAL & MGMT  09/10/2017   IR RADIOLOGIST EVAL & MGMT  10/19/2017   lymphatic mass surgery     NASAL TURBINATE REDUCTION     RADIOLOGY WITH ANESTHESIA N/A 12/14/2017   Procedure: RADIOLOGY WITH ANESTHESIA EMBOLIZATION;  Surgeon: Julieanne Cotton, MD;  Location: [facility] OR;  Service: Radiology;  Laterality: N/A;   VIDEO BRONCHOSCOPY Bilateral 11/18/2017   Procedure: VIDEO BRONCHOSCOPY WITHOUT  FLUORO;  Surgeon: Leslye Peer, MD;  Location: [facility];  Service: Cardiopulmonary;  Laterality: Bilateral;   Patient Active Problem List   Diagnosis Date Noted   SNHL (sensory-neural hearing loss), 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   ESRD on dialysis (HCC) 11/20/2022   Mold exposure 08/14/2022   Opioid withdrawal (HCC) 08/14/2022   ILD (interstitial lung disease) (HCC) 08/14/2022   Secondary hyperparathyroidism, renal (HCC) 08/14/2022   Paroxysmal A-fib (HCC) 05/20/2022   Hypertrophy of nasal turbinates 09/05/2021   Chronic kidney disease, stage 5 (HCC)    Aortic atherosclerosis (HCC) 06/24/2021   Chronic rhinitis 10/01/2017   Seasonal allergic conjunctivitis 10/01/2017   Aneurysm (HCC) 09/24/2017   Acute metabolic encephalopathy 09/02/2017   Allergic rhinitis 08/20/2017   Palpitations 07/10/2016   Gout 01/23/2016   History of cataract 02/27/2015   Seasonal and perennial allergic rhinitis 02/15/2012   Chronic diastolic CHF (congestive heart failure) (HCC) 10/11/2011   GERD (gastroesophageal reflux disease)    Type 2 diabetes mellitus without complication, without long-term current use of insulin (HCC) 07/03/2010   Hyperlipidemia 07/03/2010   Essential hypertension 07/03/2010    PCP: Hoy Register, MD  REFERRING PROVIDER: Hoy Register, MD  REFERRING DIAG: muscle spasm  THERAPY DIAG:  Muscle weakness (generalized)  Difficulty in walking, not elsewhere classified  Acute pain of right shoulder  Rationale for Evaluation and Treatment: Rehabilitation  ONSET DATE: 01/08/23  SUBJECTIVE:                                                                                                                                                                                                         SUBJECTIVE STATEMENT: "Im ok" Light pain in the lowback  PERTINENT HISTORY:  Per referring physician notes: Cerv muscle spasm, mostly occurs during dialysis-started on muscle relaxer, B  chronic knee pain Recent fall, landed on R knee-ENT consult pending.   PAIN:  Are you having pain? Yes: NPRS scale: 2/10 Pain location: back, hips, L knee neck, R shoulder Pain description: it hurts Aggravating factors: standing  Relieving factors: sit down, Tylenol  PRECAUTIONS: Fall  WEIGHT BEARING RESTRICTIONS: No  FALLS:  Has patient fallen in last 6 months? Yes. Number of falls 1  LIVING ENVIRONMENT: Lives with: lives with their family Lives in: House/apartment Stairs: Yes: External: 14 steps; bilateral but cannot reach both Having difficulty on steps Has following equipment at home: Single point cane and Walker - 2 wheeled  OCCUPATION: N/A  PLOF: Independent  PATIENT GOALS: Be able to stand and move on her own without pain  NEXT MD VISIT: August  OBJECTIVE:   DIAGNOSTIC FINDINGS:  X rays for L knee ordered.  COGNITION: Overall cognitive status: Within functional limits for tasks assessed  SENSATION: Patient reports episode of L fingers tingling during dialysis  POSTURE: rounded shoulders  PALPATION: TTP B piriformis, mildly over glut origins B, mildly in R shoulder, ant, supraspinatus tendon   CERVICAL ROM: moderately limited in B lateral flex with C/O pain in L neck   UPPER EXTREMITY ROM: Shoulder flex limited B due to C/O back pain.   UPPER EXTREMITY MMT: limited testing due to pain in back with resistance at shoulders. B elbows 4/5  CERVICAL SPECIAL TESTS:  Spurling's test: Negative  LUMBAR ROM: moderately limited in all planes due to back pain  LE ROM AND STRENGTH: SLR 60 degrees L, 70 degrees R, Knee to chest on L WNL, but pulls on ant R hip, B ER mildly limited, otherwise ROM WFL. Strength- 4-/5 B, except hip ext, abd 3+/5 FUNCTIONAL TESTS:  5 times sit to stand: 19.44 Functional gait assessment: TBD OWESTRY: 52% disability  TODAY'S TREATMENT:  DATE:  03/24/23 Bike L3 x4 min  Resisted gait forward backward 20lb x3 each 6in step ups 2x5 each Shoulder Ext 5lb x 10  Hamstring curls 20lb 2x10    03/17/23 NuStep L5 x 6 min. S2S OHP red ball 2x10  6in step ups rails x 2 2x10 each On airex standing shouldr flex 1lb 2x10 On airex standing shoulder Ext red 2x10 HS curls 20lb 2x10   03/12/23 NuStep L5 x 6 min Seated PPT to stretch low back Seated press ups with hands behind hips x 10. B side step against G tband resistance at knees Standing on airex, OHP with yellow ball x 10, occ CGA for safety Step ups onto 4" step, 10 2 x 5 reps each leg Heel raises on Black bar, x 10  03/03/23 NuStep L5 x 6 min CHECKED GOALS S2S holding yellow ball HS curls 20lb 2x10 Leg Ext 5lb 2x10  02/26/23 NuStep L5 x 6 min Hip add ball squeeze 2x10 LAQ 2lb 2x10 Seated march 2lb 2x10  HS curls green 2x10 S2S x5  02/24/23 UBE L1 x 2 min each some R arm discomfort NuStep L4 x 4 min S2S holding yellow ball 2x8 min assist at times  Rows & Ext green 2x10 Bridges x10 SLR x 10 each  02/17/23 NuStep L4 x 5 minutes Seated hip stretching- HS, glut stretch, figure 4, 2 x 15 sec each  Seated hip abd against G tband, 2 x 10 reps Seated rotational walk out and back with hands for trunk stretch and strengthening, 5x to each side. Standing heel raises. X 10  02/10/23 NuStep L5 x 7 min S2S 2x10 LAQ 2lb 2x10  Seated march 2lb 2x10 Standing shoulder flex 2lb WaTE for lumbar 2x10  Stair training 4 & 6 inch step 2 rails maintaining alternating pattern  PATIENT EDUCATION:  Education details: POC Person educated: Patient Education method: Explanation Education comprehension: verbalized understanding  HOME EXERCISE PROGRAM: Access Code: [email or web address] URL: [email or web address] Date: 02/10/2023 Prepared by: Debroah Baller  Exercises - Sit to Stand with Armchair  - 1 x daily - 7 x weekly - 3 sets - 10 reps -  Seated Long Arc Quad  - 1 x daily - 7 x weekly - 3 sets - 10 reps - Seated March  - 1 x daily - 7 x weekly - 3 sets - 10 reps  ASSESSMENT:  CLINICAL IMPRESSION: Pt enters doing well ~ 16 minutes late. Treatment focused on trunk stability, balance, and LE strength. Added resisted gait and step ups with mild instability. Cue for equal step length during the eccentric phase of resisted backwards walking. She tolerated increased activity with no C/O pain. Pt would benefit from continued skilled PT services to address deficits.  OBJECTIVE IMPAIRMENTS: Abnormal gait, decreased activity tolerance, decreased balance, decreased endurance, decreased mobility, difficulty walking, decreased ROM, decreased strength, increased muscle spasms, and pain.   ACTIVITY LIMITATIONS: carrying, lifting, bending, standing, squatting, stairs, and locomotion level  PARTICIPATION LIMITATIONS: meal prep, cleaning, laundry, shopping, and community activity  PERSONAL FACTORS: Past/current experiences are also affecting patient's functional outcome.   REHAB POTENTIAL: Good  CLINICAL DECISION MAKING: Stable/uncomplicated  EVALUATION COMPLEXITY: Moderate   GOALS: Goals reviewed with patient? Yes  SHORT TERM GOALS: Target date: 02/07/23  I with initial HEP Baseline:  Goal status: 02/17/23-met  LONG TERM GOALS: Target date: 04/02/23  I with final HEP Baseline:  Goal status: INITIAL  2.  Improve Owestry score to < 25% for minimal disability Baseline: 52% Goal status:  Progressing 28% 03/17/23  3.  Patient will demonstrate full, pain free ROM of B shoulders and hips Baseline: Movement causes back pain Goal status: INITIAL  4.  Increase B hip strength to at least 4/5  Baseline: 3+/5 Goal status: 03/12/23 -4-/5, progressing  5.  Patient will score at least 24/30 on FGA Baseline: TBD Goal status: INITIAL  6.  Patient will safely ambulate up and down at least 12 step using rail and S. Baseline: up to min A on  steps Goal status: Met 03/17/23    PLAN:  PT FREQUENCY: 1-2x/week  PT DURATION: 10 weeks  PLANNED INTERVENTIONS: Therapeutic exercises, Therapeutic activity, Neuromuscular re-education, Balance training, Gait training, Patient/Family education, Self Care, Joint mobilization, Stair training, Dry Needling, Electrical stimulation, Spinal mobilization, Cryotherapy, Moist heat, Ionotophoresis 4mg /ml Dexamethasone, and Manual therapy  PLAN FOR NEXT SESSION: Complete 5 x STS, FGA, initiate HEP for stretch and strengthening of Upper and Lower trunk  Oley Balm DPT 03/24/23 1:19 PM

## 2023-03-25 DIAGNOSIS — Z992 Dependence on renal dialysis: Secondary | ICD-10-CM | POA: Diagnosis not present

## 2023-03-25 DIAGNOSIS — N186 End stage renal disease: Secondary | ICD-10-CM | POA: Diagnosis not present

## 2023-03-25 DIAGNOSIS — N2581 Secondary hyperparathyroidism of renal origin: Secondary | ICD-10-CM | POA: Diagnosis not present

## 2023-03-26 ENCOUNTER — Encounter (HOSPITAL_COMMUNITY): Payer: Self-pay

## 2023-03-26 ENCOUNTER — Ambulatory Visit: Payer: Medicare HMO | Admitting: Physical Therapy

## 2023-03-26 ENCOUNTER — Encounter: Payer: Self-pay | Admitting: Physical Therapy

## 2023-03-26 DIAGNOSIS — M6281 Muscle weakness (generalized): Secondary | ICD-10-CM

## 2023-03-26 DIAGNOSIS — R262 Difficulty in walking, not elsewhere classified: Secondary | ICD-10-CM

## 2023-03-26 DIAGNOSIS — M5459 Other low back pain: Secondary | ICD-10-CM | POA: Diagnosis not present

## 2023-03-26 DIAGNOSIS — M25511 Pain in right shoulder: Secondary | ICD-10-CM | POA: Diagnosis not present

## 2023-03-26 DIAGNOSIS — M62838 Other muscle spasm: Secondary | ICD-10-CM | POA: Diagnosis not present

## 2023-03-26 NOTE — Therapy (Signed)
OUTPATIENT PHYSICAL THERAPY CERVICAL TREATMENT    Patient Name: Paula [name] MRN: [id number] DOB:01/06/1946, 77 y.o., female Today's Date: 03/26/2023  END OF SESSION:  PT End of Session - 03/26/23 1306     Visit Number 12    Date for PT Re-Evaluation 04/02/23    PT Start Time 1302    PT Stop Time 1345    PT Time Calculation (min) 43 min    Activity Tolerance Patient tolerated treatment well    Behavior During Therapy [facility] for tasks assessed/performed              Past Medical History:  Diagnosis Date   Allergic rhinitis    Arthritis    Asthma    Brain aneurysm    Chronic kidney disease    Cough    Diabetes mellitus    Diastolic CHF, chronic (HCC) 10/11/2011   GERD (gastroesophageal reflux disease)    History of colon polyps 2012   tubular adenoma    Hyperlipidemia    Hypertension    Neuropathy 10/11/2011   PONV (postoperative nausea and vomiting)    one time after lymph node surgery   Past Surgical History:  Procedure Laterality Date   AV FISTULA PLACEMENT Right 10/15/2021   Procedure: ARTERIOVENOUS (AV) FISTULA  WITH PLACEMENT OF GORE-TEX STRETCH  GRAFT (4-69mmx45cm);  Surgeon: Chuck Hint, MD;  Location: [facility] OR;  Service: Vascular;  Laterality: Right;   CATARACT EXTRACTION     right eye   IR 3D INDEPENDENT WKST  12/14/2017   IR ANGIO INTRA EXTRACRAN SEL COM CAROTID INNOMINATE BILAT MOD SED  09/15/2017   IR ANGIO INTRA EXTRACRAN SEL COM CAROTID INNOMINATE UNI L MOD SED  12/14/2017   IR ANGIO VERTEBRAL SEL VERTEBRAL BILAT MOD SED  09/15/2017   IR RADIOLOGIST EVAL & MGMT  09/10/2017   IR RADIOLOGIST EVAL & MGMT  10/19/2017   lymphatic mass surgery     NASAL TURBINATE REDUCTION     RADIOLOGY WITH ANESTHESIA N/A 12/14/2017   Procedure: RADIOLOGY WITH ANESTHESIA EMBOLIZATION;  Surgeon: Julieanne Cotton, MD;  Location: [facility] OR;  Service: Radiology;  Laterality: N/A;   VIDEO BRONCHOSCOPY Bilateral 11/18/2017   Procedure: VIDEO BRONCHOSCOPY WITHOUT  FLUORO;  Surgeon: Leslye Peer, MD;  Location: [facility] ENDOSCOPY;  Service: Cardiopulmonary;  Laterality: Bilateral;   Patient Active Problem List   Diagnosis Date Noted   SNHL (sensory-neural hearing loss), 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   ESRD on dialysis (HCC) 11/20/2022   Mold exposure 08/14/2022   Opioid withdrawal (HCC) 08/14/2022   ILD (interstitial lung disease) (HCC) 08/14/2022   Secondary hyperparathyroidism, renal (HCC) 08/14/2022   Paroxysmal A-fib (HCC) 05/20/2022   Hypertrophy of nasal turbinates 09/05/2021   Chronic kidney disease, stage 5 (HCC)    Aortic atherosclerosis (HCC) 06/24/2021   Chronic rhinitis 10/01/2017   Seasonal allergic conjunctivitis 10/01/2017   Aneurysm (HCC) 09/24/2017   Acute metabolic encephalopathy 09/02/2017   Allergic rhinitis 08/20/2017   Palpitations 07/10/2016   Gout 01/23/2016   History of cataract 02/27/2015   Seasonal and perennial allergic rhinitis 02/15/2012   Chronic diastolic CHF (congestive heart failure) (HCC) 10/11/2011   GERD (gastroesophageal reflux disease)    Type 2 diabetes mellitus without complication, without long-term current use of insulin (HCC) 07/03/2010   Hyperlipidemia 07/03/2010   Essential hypertension 07/03/2010    PCP: Hoy Register, MD  REFERRING PROVIDER: Hoy Register, MD  REFERRING DIAG: muscle spasm  THERAPY DIAG:  Muscle weakness (generalized)  Difficulty in walking, not elsewhere classified  Acute pain of right shoulder  Other muscle spasm  Other low back pain  Rationale for Evaluation and Treatment: Rehabilitation  ONSET DATE: 01/08/23  SUBJECTIVE:                                                                                                                                                                                                         SUBJECTIVE STATEMENT: "I am feeling ok"" Actually I am doing better now"  PERTINENT HISTORY:  Per referring physician notes: Cerv muscle spasm,  mostly occurs during dialysis-started on muscle relaxer, B chronic knee pain Recent fall, landed on R knee-ENT consult pending.   PAIN:  Are you having pain? Yes: NPRS scale: 0/10 Pain location: back, hips, L knee neck, R shoulder Pain description: tired Aggravating factors: standing  Relieving factors: sit down, Tylenol  PRECAUTIONS: Fall  WEIGHT BEARING RESTRICTIONS: No  FALLS:  Has patient fallen in last 6 months? Yes. Number of falls 1  LIVING ENVIRONMENT: Lives with: lives with their family Lives in: House/apartment Stairs: Yes: External: 14 steps; bilateral but cannot reach both Having difficulty on steps Has following equipment at home: Single point cane and Walker - 2 wheeled  OCCUPATION: N/A  PLOF: Independent  PATIENT GOALS: Be able to stand and move on her own without pain  NEXT MD VISIT: August  OBJECTIVE:   DIAGNOSTIC FINDINGS:  X rays for L knee ordered.  COGNITION: Overall cognitive status: Within functional limits for tasks assessed  SENSATION: Patient reports episode of L fingers tingling during dialysis  POSTURE: rounded shoulders  PALPATION: TTP B piriformis, mildly over glut origins B, mildly in R shoulder, ant, supraspinatus tendon   CERVICAL ROM: moderately limited in B lateral flex with C/O pain in L neck   UPPER EXTREMITY ROM: Shoulder flex limited B due to C/O back pain.   UPPER EXTREMITY MMT: limited testing due to pain in back with resistance at shoulders. B elbows 4/5  CERVICAL SPECIAL TESTS:  Spurling's test: Negative  LUMBAR ROM: moderately limited in all planes due to back pain  LE ROM AND STRENGTH: SLR 60 degrees L, 70 degrees R, Knee to chest on L WNL, but pulls on ant R hip, B ER mildly limited, otherwise ROM WFL. Strength- 4-/5 B, except hip ext, abd 3+/5 FUNCTIONAL TESTS:  5 times sit to stand: 19.44 Functional gait assessment: TBD OWESTRY: 52% disability  TODAY'S TREATMENT:  DATE:  03/26/23 NuStep L 5 x 7  min Resisted gait all directions 20lb x3 each Shoulder Ext blue 2x10  S2S OHP red ball 2x10 Side steps on balance beam in // bars   03/24/23 Bike L3 x4 min  Resisted gait forward backward 20lb x3 each 6in step ups 2x5 each Shoulder Ext 5lb x 10  Hamstring curls 20lb 2x10    03/17/23 NuStep L5 x 6 min. S2S OHP red  ball 2x10  6in step ups rails x 2 2x10 each On airex standing shouldr flex 1lb 2x10 On airex standing shoulder Ext red 2x10 HS curls 20lb 2x10   03/12/23 NuStep L5 x 6 min Seated PPT to stretch low back Seated press ups with hands behind hips x 10. B side step against G tband resistance at knees Standing on airex, OHP with yellow ball x 10, occ CGA for safety Step ups onto 4" step, 10 2 x 5 reps each leg Heel raises on Black bar, x 10  03/03/23 NuStep L5 x 6 min CHECKED GOALS S2S holding yellow ball HS curls 20lb 2x10 Leg Ext 5lb 2x10  02/26/23 NuStep L5 x 6 min Hip add ball squeeze 2x10 LAQ 2lb 2x10 Seated march 2lb 2x10  HS curls green 2x10 S2S x5  02/24/23 UBE L1 x 2 min each some R arm discomfort NuStep L4 x 4 min S2S holding yellow ball 2x8 min assist at times  Rows & Ext green 2x10 Bridges x10 SLR x 10 each  02/17/23 NuStep L4 x 5 minutes Seated hip stretching- HS, glut stretch, figure 4, 2 x 15 sec each  Seated hip abd against G tband, 2 x 10 reps Seated rotational walk out and back with hands for trunk stretch and strengthening, 5x to each side. Standing heel raises. X 10  02/10/23 NuStep L5 x 7 min S2S 2x10 LAQ 2lb 2x10  Seated march 2lb 2x10 Standing shoulder flex 2lb WaTE for lumbar 2x10  Stair training 4 & 6 inch step 2 rails maintaining alternating pattern  PATIENT EDUCATION:  Education details: POC Person educated: Patient Education method: Explanation Education comprehension: verbalized understanding  HOME  EXERCISE PROGRAM: Access Code: [email or web address] URL: [email or web address] Date: 02/10/2023 Prepared by: Debroah Baller  Exercises - Sit to Stand with Armchair  - 1 x daily - 7 x weekly - 3 sets - 10 reps - Seated Long Arc Quad  - 1 x daily - 7 x weekly - 3 sets - 10 reps - Seated March  - 1 x daily - 7 x weekly - 3 sets - 10 reps  ASSESSMENT:  CLINICAL IMPRESSION: Pt enters doing well. Treatment focused on trunk stability, balance, and LE strength. Good carryover with resisted gait and step ups. Pt able to complete sets of side steps with UE on rails. She tolerated increased activity with no C/O pain. Pt would benefit from continued skilled PT services to address deficits.  OBJECTIVE IMPAIRMENTS: Abnormal gait, decreased activity tolerance, decreased balance, decreased endurance, decreased mobility, difficulty walking, decreased ROM, decreased strength, increased muscle spasms, and pain.   ACTIVITY LIMITATIONS: carrying, lifting, bending, standing, squatting, stairs, and locomotion level  PARTICIPATION LIMITATIONS: meal prep, cleaning, laundry, shopping, and community activity  PERSONAL FACTORS: Past/current experiences are also affecting patient's functional outcome.   REHAB POTENTIAL: Good  CLINICAL DECISION MAKING: Stable/uncomplicated  EVALUATION COMPLEXITY: Moderate   GOALS: Goals reviewed with patient? Yes  SHORT TERM GOALS: Target date: 02/07/23  I with initial HEP Baseline:  Goal status: 02/17/23-met  LONG TERM  GOALS: Target date: 04/02/23  I with final HEP Baseline:  Goal status: INITIAL  2.  Improve Owestry score to < 25% for minimal disability Baseline: 52% Goal status: Progressing 28% 03/17/23  3.  Patient will demonstrate full, pain free ROM of B shoulders and hips Baseline: Movement causes back pain Goal status: Progressing 03/26/23  4.  Increase B hip strength to at least 4/5  Baseline: 3+/5 Goal status: 03/12/23 -4-/5, progressing  5.  Patient  will score at least 24/30 on FGA Baseline: TBD Goal status: INITIAL  6.  Patient will safely ambulate up and down at least 12 step using rail and S. Baseline: up to min A on steps Goal status: Met 03/17/23    PLAN:  PT FREQUENCY: 1-2x/week  PT DURATION: 10 weeks  PLANNED INTERVENTIONS: Therapeutic exercises, Therapeutic activity, Neuromuscular re-education, Balance training, Gait training, Patient/Family education, Self Care, Joint mobilization, Stair training, Dry Needling, Electrical stimulation, Spinal mobilization, Cryotherapy, Moist heat, Ionotophoresis 4mg /ml Dexamethasone, and Manual therapy  PLAN FOR NEXT SESSION: Progress with postural strength and balance  Debroah Baller, PTA  03/26/23 1:08 PM

## 2023-03-27 DIAGNOSIS — Z992 Dependence on renal dialysis: Secondary | ICD-10-CM | POA: Diagnosis not present

## 2023-03-27 DIAGNOSIS — N186 End stage renal disease: Secondary | ICD-10-CM | POA: Diagnosis not present

## 2023-03-27 DIAGNOSIS — N2581 Secondary hyperparathyroidism of renal origin: Secondary | ICD-10-CM | POA: Diagnosis not present

## 2023-03-30 DIAGNOSIS — N186 End stage renal disease: Secondary | ICD-10-CM | POA: Diagnosis not present

## 2023-03-30 DIAGNOSIS — N2581 Secondary hyperparathyroidism of renal origin: Secondary | ICD-10-CM | POA: Diagnosis not present

## 2023-03-30 DIAGNOSIS — Z992 Dependence on renal dialysis: Secondary | ICD-10-CM | POA: Diagnosis not present

## 2023-03-31 ENCOUNTER — Encounter: Payer: Self-pay | Admitting: Physical Therapy

## 2023-03-31 ENCOUNTER — Ambulatory Visit: Payer: Medicare HMO | Admitting: Physical Therapy

## 2023-03-31 DIAGNOSIS — R262 Difficulty in walking, not elsewhere classified: Secondary | ICD-10-CM | POA: Diagnosis not present

## 2023-03-31 DIAGNOSIS — M6281 Muscle weakness (generalized): Secondary | ICD-10-CM | POA: Diagnosis not present

## 2023-03-31 DIAGNOSIS — M5459 Other low back pain: Secondary | ICD-10-CM | POA: Diagnosis not present

## 2023-03-31 DIAGNOSIS — M25511 Pain in right shoulder: Secondary | ICD-10-CM

## 2023-03-31 DIAGNOSIS — M62838 Other muscle spasm: Secondary | ICD-10-CM | POA: Diagnosis not present

## 2023-03-31 NOTE — Therapy (Signed)
OUTPATIENT PHYSICAL THERAPY CERVICAL TREATMENT    Patient Name: Paula [name] MRN: [id number] DOB:1945-09-25, 77 y.o., female Today's Date: 03/31/2023  END OF SESSION:  PT End of Session - 03/31/23 1310     Visit Number 13    Date for PT Re-Evaluation 04/02/23    PT Start Time 1306    PT Stop Time 1345    PT Time Calculation (min) 39 min    Activity Tolerance Patient tolerated treatment well    Behavior During Therapy [facility] for tasks assessed/performed              Past Medical History:  Diagnosis Date   Allergic rhinitis    Arthritis    Asthma    Brain aneurysm    Chronic kidney disease    Cough    Diabetes mellitus    Diastolic CHF, chronic (HCC) 10/11/2011   GERD (gastroesophageal reflux disease)    History of colon polyps 2012   tubular adenoma    Hyperlipidemia    Hypertension    Neuropathy 10/11/2011   PONV (postoperative nausea and vomiting)    one time after lymph node surgery   Past Surgical History:  Procedure Laterality Date   AV FISTULA PLACEMENT Right 10/15/2021   Procedure: ARTERIOVENOUS (AV) FISTULA  WITH PLACEMENT OF GORE-TEX STRETCH  GRAFT (4-25mmx45cm);  Surgeon: Chuck Hint, MD;  Location: [facility] OR;  Service: Vascular;  Laterality: Right;   CATARACT EXTRACTION     right eye   IR 3D INDEPENDENT WKST  12/14/2017   IR ANGIO INTRA EXTRACRAN SEL COM CAROTID INNOMINATE BILAT MOD SED  09/15/2017   IR ANGIO INTRA EXTRACRAN SEL COM CAROTID INNOMINATE UNI L MOD SED  12/14/2017   IR ANGIO VERTEBRAL SEL VERTEBRAL BILAT MOD SED  09/15/2017   IR RADIOLOGIST EVAL & MGMT  09/10/2017   IR RADIOLOGIST EVAL & MGMT  10/19/2017   lymphatic mass surgery     NASAL TURBINATE REDUCTION     RADIOLOGY WITH ANESTHESIA N/A 12/14/2017   Procedure: RADIOLOGY WITH ANESTHESIA EMBOLIZATION;  Surgeon: Julieanne Cotton, MD;  Location: [facility] OR;  Service: Radiology;  Laterality: N/A;   VIDEO BRONCHOSCOPY Bilateral 11/18/2017   Procedure: VIDEO BRONCHOSCOPY WITHOUT  FLUORO;  Surgeon: Leslye Peer, MD;  Location: [facility];  Service: Cardiopulmonary;  Laterality: Bilateral;   Patient Active Problem List   Diagnosis Date Noted   SNHL (sensory-neural hearing loss), 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   ESRD on dialysis (HCC) 11/20/2022   Mold exposure 08/14/2022   Opioid withdrawal (HCC) 08/14/2022   ILD (interstitial lung disease) (HCC) 08/14/2022   Secondary hyperparathyroidism, renal (HCC) 08/14/2022   Paroxysmal A-fib (HCC) 05/20/2022   Hypertrophy of nasal turbinates 09/05/2021   Chronic kidney disease, stage 5 (HCC)    Aortic atherosclerosis (HCC) 06/24/2021   Chronic rhinitis 10/01/2017   Seasonal allergic conjunctivitis 10/01/2017   Aneurysm (HCC) 09/24/2017   Acute metabolic encephalopathy 09/02/2017   Allergic rhinitis 08/20/2017   Palpitations 07/10/2016   Gout 01/23/2016   History of cataract 02/27/2015   Seasonal and perennial allergic rhinitis 02/15/2012   Chronic diastolic CHF (congestive heart failure) (HCC) 10/11/2011   GERD (gastroesophageal reflux disease)    Type 2 diabetes mellitus without complication, without long-term current use of insulin (HCC) 07/03/2010   Hyperlipidemia 07/03/2010   Essential hypertension 07/03/2010    PCP: Hoy Register, MD  REFERRING PROVIDER: Hoy Register, MD  REFERRING DIAG: muscle spasm  THERAPY DIAG:  Muscle weakness (generalized)  Acute pain of right shoulder  Difficulty  in walking, not elsewhere classified  Rationale for Evaluation and Treatment: Rehabilitation  ONSET DATE: 01/08/23  SUBJECTIVE:                                                                                                                                                                                                         SUBJECTIVE STATEMENT: "Tired today"  PERTINENT HISTORY:  Per referring physician notes: Cerv muscle spasm, mostly occurs during dialysis-started on muscle relaxer, B chronic knee  pain Recent fall, landed on R knee-ENT consult pending.   PAIN:  Are you having pain? Yes: NPRS scale: 0/10 Pain location: back, hips, L knee neck, R shoulder Pain description: tired Aggravating factors: standing  Relieving factors: sit down, Tylenol  PRECAUTIONS: Fall  WEIGHT BEARING RESTRICTIONS: No  FALLS:  Has patient fallen in last 6 months? Yes. Number of falls 1  LIVING ENVIRONMENT: Lives with: lives with their family Lives in: House/apartment Stairs: Yes: External: 14 steps; bilateral but cannot reach both Having difficulty on steps Has following equipment at home: Single point cane and Walker - 2 wheeled  OCCUPATION: N/A  PLOF: Independent  PATIENT GOALS: Be able to stand and move on her own without pain  NEXT MD VISIT: August  OBJECTIVE:   DIAGNOSTIC FINDINGS:  X rays for L knee ordered.  COGNITION: Overall cognitive status: Within functional limits for tasks assessed  SENSATION: Patient reports episode of L fingers tingling during dialysis  POSTURE: rounded shoulders  PALPATION: TTP B piriformis, mildly over glut origins B, mildly in R shoulder, ant, supraspinatus tendon   CERVICAL ROM: moderately limited in B lateral flex with C/O pain in L neck   UPPER EXTREMITY ROM: Shoulder flex limited B due to C/O back pain.   UPPER EXTREMITY MMT: limited testing due to pain in back with resistance at shoulders. B elbows 4/5  CERVICAL SPECIAL TESTS:  Spurling's test: Negative  LUMBAR ROM: moderately limited in all planes due to back pain  LE ROM AND STRENGTH: SLR 60 degrees L, 70 degrees R, Knee to chest on L WNL, but pulls on ant R hip, B ER mildly limited, otherwise ROM WFL. Strength- 4-/5 B, except hip ext, abd 3+/5 FUNCTIONAL TESTS:  5 times sit to stand: 19.44 Functional gait assessment: TBD OWESTRY: 52% disability  TODAY'S TREATMENT:  DATE:  03/31/23 NuStep L 5 x 7  min From airex 6in step up x10  Resisted side step 20lb x5  HS curls 20lb 2x10 Leg Ext 5lb 2x10 S2S OHP yellow ball 2x10 Shoulder Ext green x10  03/26/23 NuStep L 5 x 7  min Resisted gait all directions 20lb x3 each Shoulder Ext blue 2x10  S2S OHP red ball 2x10 Side steps on balance beam in // bars   03/24/23 Bike L3 x4 min  Resisted gait forward backward 20lb x3 each 6in step ups 2x5 each Shoulder Ext 5lb x 10  Hamstring curls 20lb 2x10    03/17/23 NuStep L5 x 6 min. S2S OHP red  ball 2x10  6in step ups rails x 2 2x10 each On airex standing shouldr flex 1lb 2x10 On airex standing shoulder Ext red 2x10 HS curls 20lb 2x10   03/12/23 NuStep L5 x 6 min Seated PPT to stretch low back Seated press ups with hands behind hips x 10. B side step against G tband resistance at knees Standing on airex, OHP with yellow ball x 10, occ CGA for safety Step ups onto 4" step, 10 2 x 5 reps each leg Heel raises on Black bar, x 10  03/03/23 NuStep L5 x 6 min CHECKED GOALS S2S holding yellow ball HS curls 20lb 2x10 Leg Ext 5lb 2x10  02/26/23 NuStep L5 x 6 min Hip add ball squeeze 2x10 LAQ 2lb 2x10 Seated march 2lb 2x10  HS curls green 2x10 S2S x5  02/24/23 UBE L1 x 2 min each some R arm discomfort NuStep L4 x 4 min S2S holding yellow ball 2x8 min assist at times  Rows & Ext green 2x10 Bridges x10 SLR x 10 each  02/17/23 NuStep L4 x 5 minutes Seated hip stretching- HS, glut stretch, figure 4, 2 x 15 sec each  Seated hip abd against G tband, 2 x 10 reps Seated rotational walk out and back with hands for trunk stretch and strengthening, 5x to each side. Standing heel raises. X 10  02/10/23 NuStep L5 x 7 min S2S 2x10 LAQ 2lb 2x10  Seated march 2lb 2x10 Standing shoulder flex 2lb WaTE for lumbar 2x10  Stair training 4 & 6 inch step 2 rails maintaining alternating pattern  PATIENT EDUCATION:  Education details: POC Person  educated: Patient Education method: Explanation Education comprehension: verbalized understanding  HOME EXERCISE PROGRAM: Access Code: [email or web address] URL: [email or web address] Date: 02/10/2023 Prepared by: Debroah Baller  Exercises - Sit to Stand with Armchair  - 1 x daily - 7 x weekly - 3 sets - 10 reps - Seated Long Arc Quad  - 1 x daily - 7 x weekly - 3 sets - 10 reps - Seated March  - 1 x daily - 7 x weekly - 3 sets - 10 reps  ASSESSMENT:  CLINICAL IMPRESSION: Pt enters doing well. Added non compliant surface to step ups requiring SBA. No pain during session. Cue to keep hip square with resisted sides steps. Postural cue needed with shoulder Ext.  Pt would benefit from continued skilled PT services to address deficits.  OBJECTIVE IMPAIRMENTS: Abnormal gait, decreased activity tolerance, decreased balance, decreased endurance, decreased mobility, difficulty walking, decreased ROM, decreased strength, increased muscle spasms, and pain.   ACTIVITY LIMITATIONS: carrying, lifting, bending, standing, squatting, stairs, and locomotion level  PARTICIPATION LIMITATIONS: meal prep, cleaning, laundry, shopping, and community activity  PERSONAL FACTORS: Past/current experiences are also affecting patient's functional outcome.   REHAB POTENTIAL: Good  CLINICAL DECISION MAKING: Stable/uncomplicated  EVALUATION COMPLEXITY: Moderate   GOALS: Goals reviewed with patient? Yes  SHORT TERM GOALS: Target date: 02/07/23  I with initial HEP Baseline:  Goal status: 02/17/23-met  LONG TERM GOALS: Target date: 04/02/23  I with final HEP Baseline:  Goal status: INITIAL  2.  Improve Owestry score to < 25% for minimal disability Baseline: 52% Goal status: Progressing 28% 03/17/23  3.  Patient will demonstrate full, pain free ROM of B shoulders and hips Baseline: Movement causes back pain Goal status: Progressing 03/26/23  4.  Increase B hip strength to at least 4/5  Baseline:  3+/5 Goal status: 03/12/23 -4-/5, progressing  5.  Patient will score at least 24/30 on FGA Baseline: TBD Goal status: INITIAL  6.  Patient will safely ambulate up and down at least 12 step using rail and S. Baseline: up to min A on steps Goal status: Met 03/17/23    PLAN:  PT FREQUENCY: 1-2x/week  PT DURATION: 10 weeks  PLANNED INTERVENTIONS: Therapeutic exercises, Therapeutic activity, Neuromuscular re-education, Balance training, Gait training, Patient/Family education, Self Care, Joint mobilization, Stair training, Dry Needling, Electrical stimulation, Spinal mobilization, Cryotherapy, Moist heat, Ionotophoresis 4mg /ml Dexamethasone, and Manual therapy  PLAN FOR NEXT SESSION: Progress with postural strength and balance, Possible D/C  Debroah Baller, PTA  03/31/23 1:11 PM

## 2023-04-01 DIAGNOSIS — N2581 Secondary hyperparathyroidism of renal origin: Secondary | ICD-10-CM | POA: Diagnosis not present

## 2023-04-01 DIAGNOSIS — N186 End stage renal disease: Secondary | ICD-10-CM | POA: Diagnosis not present

## 2023-04-01 DIAGNOSIS — Z992 Dependence on renal dialysis: Secondary | ICD-10-CM | POA: Diagnosis not present

## 2023-04-02 ENCOUNTER — Other Ambulatory Visit: Payer: Self-pay | Admitting: Family Medicine

## 2023-04-02 ENCOUNTER — Other Ambulatory Visit: Payer: Self-pay

## 2023-04-02 ENCOUNTER — Other Ambulatory Visit (HOSPITAL_COMMUNITY): Payer: Self-pay

## 2023-04-02 ENCOUNTER — Encounter (HOSPITAL_COMMUNITY): Payer: Self-pay

## 2023-04-02 ENCOUNTER — Encounter: Payer: Self-pay | Admitting: Physical Therapy

## 2023-04-02 ENCOUNTER — Ambulatory Visit: Payer: Medicare HMO | Admitting: Physical Therapy

## 2023-04-02 DIAGNOSIS — M62838 Other muscle spasm: Secondary | ICD-10-CM | POA: Diagnosis not present

## 2023-04-02 DIAGNOSIS — R262 Difficulty in walking, not elsewhere classified: Secondary | ICD-10-CM | POA: Diagnosis not present

## 2023-04-02 DIAGNOSIS — M5459 Other low back pain: Secondary | ICD-10-CM | POA: Diagnosis not present

## 2023-04-02 DIAGNOSIS — M6281 Muscle weakness (generalized): Secondary | ICD-10-CM | POA: Diagnosis not present

## 2023-04-02 DIAGNOSIS — M25511 Pain in right shoulder: Secondary | ICD-10-CM | POA: Diagnosis not present

## 2023-04-02 MED ORDER — APIXABAN 5 MG PO TABS
5.0000 mg | ORAL_TABLET | Freq: Two times a day (BID) | ORAL | 1 refills | Status: DC
  Filled 2023-04-02 – 2023-05-27 (×4): qty 180, 90d supply, fill #0
  Filled 2023-06-13 – 2023-09-02 (×3): qty 180, 90d supply, fill #1

## 2023-04-02 NOTE — Therapy (Signed)
OUTPATIENT PHYSICAL THERAPY CERVICAL TREATMENT  PHYSICAL THERAPY DISCHARGE SUMMARY  Visits from Start of Care: 14  Current functional level related to goals / functional outcomes: Patient progressed toward LTG   Remaining deficits: weakness   Education / Equipment: HEP   Patient agrees to discharge. Patient goals were partially met. Patient is being discharged due to being pleased with the current functional level.    Patient Name: Paula [name] MRN: [id number] DOB:1945-08-17, 77 y.o., female Today's Date: 04/02/2023  END OF SESSION:  PT End of Session - 04/02/23 1304     Visit Number 14    Date for PT Re-Evaluation 04/02/23    PT Start Time 1304    PT Stop Time 1345    PT Time Calculation (min) 41 min    Activity Tolerance Patient tolerated treatment well    Behavior During Therapy [facility] for tasks assessed/performed              Past Medical History:  Diagnosis Date   Allergic rhinitis    Arthritis    Asthma    Brain aneurysm    Chronic kidney disease    Cough    Diabetes mellitus    Diastolic CHF, chronic (HCC) 10/11/2011   GERD (gastroesophageal reflux disease)    History of colon polyps 2012   tubular adenoma    Hyperlipidemia    Hypertension    Neuropathy 10/11/2011   PONV (postoperative nausea and vomiting)    one time after lymph node surgery   Past Surgical History:  Procedure Laterality Date   AV FISTULA PLACEMENT Right 10/15/2021   Procedure: ARTERIOVENOUS (AV) FISTULA  WITH PLACEMENT OF GORE-TEX STRETCH  GRAFT (4-31mmx45cm);  Surgeon: Chuck Hint, MD;  Location: [facility] OR;  Service: Vascular;  Laterality: Right;   CATARACT EXTRACTION     right eye   IR 3D INDEPENDENT WKST  12/14/2017   IR ANGIO INTRA EXTRACRAN SEL COM CAROTID INNOMINATE BILAT MOD SED  09/15/2017   IR ANGIO INTRA EXTRACRAN SEL COM CAROTID INNOMINATE UNI L MOD SED  12/14/2017   IR ANGIO VERTEBRAL SEL VERTEBRAL BILAT MOD SED  09/15/2017   IR RADIOLOGIST EVAL & MGMT   09/10/2017   IR RADIOLOGIST EVAL & MGMT  10/19/2017   lymphatic mass surgery     NASAL TURBINATE REDUCTION     RADIOLOGY WITH ANESTHESIA N/A 12/14/2017   Procedure: RADIOLOGY WITH ANESTHESIA EMBOLIZATION;  Surgeon: Julieanne Cotton, MD;  Location: [facility] OR;  Service: Radiology;  Laterality: N/A;   VIDEO BRONCHOSCOPY Bilateral 11/18/2017   Procedure: VIDEO BRONCHOSCOPY WITHOUT FLUORO;  Surgeon: Leslye Peer, MD;  Location: [facility] ENDOSCOPY;  Service: Cardiopulmonary;  Laterality: Bilateral;   Patient Active Problem List   Diagnosis Date Noted   SNHL (sensory-neural hearing loss), 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   ESRD on dialysis (HCC) 11/20/2022   Mold exposure 08/14/2022   Opioid withdrawal (HCC) 08/14/2022   ILD (interstitial lung disease) (HCC) 08/14/2022   Secondary hyperparathyroidism, renal (HCC) 08/14/2022   Paroxysmal A-fib (HCC) 05/20/2022   Hypertrophy of nasal turbinates 09/05/2021   Chronic kidney disease, stage 5 (HCC)    Aortic atherosclerosis (HCC) 06/24/2021   Chronic rhinitis 10/01/2017   Seasonal allergic conjunctivitis 10/01/2017   Aneurysm (HCC) 09/24/2017   Acute metabolic encephalopathy 09/02/2017   Allergic rhinitis 08/20/2017   Palpitations 07/10/2016   Gout 01/23/2016   History of cataract 02/27/2015   Seasonal and perennial allergic rhinitis 02/15/2012   Chronic diastolic CHF (congestive heart failure) (HCC) 10/11/2011   GERD (gastroesophageal  reflux disease)    Type 2 diabetes mellitus without complication, without long-term current use of insulin (HCC) 07/03/2010   Hyperlipidemia 07/03/2010   Essential hypertension 07/03/2010    PCP: Hoy Register, MD  REFERRING PROVIDER: Hoy Register, MD  REFERRING DIAG: muscle spasm  THERAPY DIAG:  Muscle weakness (generalized)  Acute pain of right shoulder  Other muscle spasm  Difficulty in walking, not elsewhere classified  Rationale for Evaluation and Treatment: Rehabilitation  ONSET DATE:  01/08/23  SUBJECTIVE:                                                                                                                                                                                                         SUBJECTIVE STATEMENT: I feel well, just tired  PERTINENT HISTORY:  Per referring physician notes: Cerv muscle spasm, mostly occurs during dialysis-started on muscle relaxer, B chronic knee pain Recent fall, landed on R knee-ENT consult pending.   PAIN:  Are you having pain? Yes: NPRS scale: 0/10 Pain location: back, hips, L knee neck, R shoulder Pain description: tired Aggravating factors: standing  Relieving factors: sit down, Tylenol  PRECAUTIONS: Fall  WEIGHT BEARING RESTRICTIONS: No  FALLS:  Has patient fallen in last 6 months? Yes. Number of falls 1  LIVING ENVIRONMENT: Lives with: lives with their family Lives in: House/apartment Stairs: Yes: External: 14 steps; bilateral but cannot reach both Having difficulty on steps Has following equipment at home: Single point cane and Walker - 2 wheeled  OCCUPATION: N/A  PLOF: Independent  PATIENT GOALS: Be able to stand and move on her own without pain  NEXT MD VISIT: August  OBJECTIVE:   DIAGNOSTIC FINDINGS:  X rays for L knee ordered.  COGNITION: Overall cognitive status: Within functional limits for tasks assessed  SENSATION: Patient reports episode of L fingers tingling during dialysis  POSTURE: rounded shoulders  PALPATION: TTP B piriformis, mildly over glut origins B, mildly in R shoulder, ant, supraspinatus tendon   CERVICAL ROM: moderately limited in B lateral flex with C/O pain in L neck   UPPER EXTREMITY ROM: Shoulder flex limited B due to C/O back pain.   UPPER EXTREMITY MMT: limited testing due to pain in back with resistance at shoulders. B elbows 4/5  CERVICAL SPECIAL TESTS:  Spurling's test: Negative  LUMBAR ROM: moderately limited in all planes due to back pain  LE  ROM AND STRENGTH: SLR 60 degrees L, 70 degrees R, Knee to chest on L WNL, but pulls on ant R hip, B ER mildly limited, otherwise ROM WFL. Strength- 4-/5  B, except hip ext, abd 3+/5 FUNCTIONAL TESTS:  5 times sit to stand: 19.44 Functional gait assessment: TBD OWESTRY: 52% disability  TODAY'S TREATMENT:                                                                                                                              DATE:  04/02/23 NuStep L 5 x 7  min S2S OHP yellow ball 2x10 Side step on and off airex 2x5 HS curls 20lb 2x10 Leg Ext 5lb 2x10 Step ups 6in x 10 each  Shoulder Ext green 2x10  03/31/23 NuStep L 5 x 7  min From airex 6in step up x10  Resisted side step 20lb x5  HS curls 20lb 2x10 Leg Ext 5lb 2x10 S2S OHP yellow ball 2x10 Shoulder Ext green x10  03/26/23 NuStep L 5 x 7  min Resisted gait all directions 20lb x3 each Shoulder Ext blue 2x10  S2S OHP red ball 2x10 Side steps on balance beam in // bars   03/24/23 Bike L3 x4 min  Resisted gait forward backward 20lb x3 each 6in step ups 2x5 each Shoulder Ext 5lb x 10  Hamstring curls 20lb 2x10    03/17/23 NuStep L5 x 6 min. S2S OHP red  ball 2x10  6in step ups rails x 2 2x10 each On airex standing shouldr flex 1lb 2x10 On airex standing shoulder Ext red 2x10 HS curls 20lb 2x10   03/12/23 NuStep L5 x 6 min Seated PPT to stretch low back Seated press ups with hands behind hips x 10. B side step against G tband resistance at knees Standing on airex, OHP with yellow ball x 10, occ CGA for safety Step ups onto 4" step, 10 2 x 5 reps each leg Heel raises on Black bar, x 10  03/03/23 NuStep L5 x 6 min CHECKED GOALS S2S holding yellow ball HS curls 20lb 2x10 Leg Ext 5lb 2x10  02/26/23 NuStep L5 x 6 min Hip add ball squeeze 2x10 LAQ 2lb 2x10 Seated march 2lb 2x10  HS curls green 2x10 S2S x5  02/24/23 UBE L1 x 2 min each some R arm discomfort NuStep L4 x 4 min S2S holding yellow ball 2x8 min  assist at times  Rows & Ext green 2x10 Bridges x10 SLR x 10 each  02/17/23 NuStep L4 x 5 minutes Seated hip stretching- HS, glut stretch, figure 4, 2 x 15 sec each  Seated hip abd against G tband, 2 x 10 reps Seated rotational walk out and back with hands for trunk stretch and strengthening, 5x to each side. Standing heel raises. X 10  02/10/23 NuStep L5 x 7 min S2S 2x10 LAQ 2lb 2x10  Seated march 2lb 2x10 Standing shoulder flex 2lb WaTE for lumbar 2x10  Stair training 4 & 6 inch step 2 rails maintaining alternating pattern  PATIENT EDUCATION:  Education details: POC Person educated: Patient Education method: Explanation Education comprehension: verbalized understanding  HOME EXERCISE PROGRAM: Access Code:  [email or web address] URL: [email or web address] Date: 02/10/2023 Prepared by: Debroah Baller  Exercises - Sit to Stand with Armchair  - 1 x daily - 7 x weekly - 3 sets - 10 reps - Seated Long Arc Quad  - 1 x daily - 7 x weekly - 3 sets - 10 reps - Seated March  - 1 x daily - 7 x weekly - 3 sets - 10 reps  ASSESSMENT:  CLINICAL IMPRESSION: Pt enters doing well overall but reports some fatigue. Pt also reports  getting sick yesterday on dialysis. Pt did well on airex today with no LOB. Less fatigue noted with doing sit to stands compared to previous sessions. She is pleased with her current functional states, and reports she is feeling much stronger.   OBJECTIVE IMPAIRMENTS: Abnormal gait, decreased activity tolerance, decreased balance, decreased endurance, decreased mobility, difficulty walking, decreased ROM, decreased strength, increased muscle spasms, and pain.   ACTIVITY LIMITATIONS: carrying, lifting, bending, standing, squatting, stairs, and locomotion level  PARTICIPATION LIMITATIONS: meal prep, cleaning, laundry, shopping, and community activity  PERSONAL FACTORS: Past/current experiences are also affecting patient's functional outcome.   REHAB POTENTIAL:  Good  CLINICAL DECISION MAKING: Stable/uncomplicated  EVALUATION COMPLEXITY: Moderate   GOALS: Goals reviewed with patient? Yes  SHORT TERM GOALS: Target date: 02/07/23  I with initial HEP Baseline:  Goal status: 02/17/23-met  LONG TERM GOALS: Target date: 04/02/23  I with final HEP Baseline:  Goal status: INITIAL  2.  Improve Owestry score to < 25% for minimal disability Baseline: 52% Goal status: Progressing 28% 03/17/23  3.  Patient will demonstrate full, pain free ROM of B shoulders and hips Baseline: Movement causes back pain Goal status: Progressing 03/26/23  4.  Increase B hip strength to at least 4/5  Baseline: 3+/5 Goal status: 03/12/23 -4-/5, progressing  5.  Patient will score at least 24/30 on FGA Baseline: TBD Goal status: INITIAL  6.  Patient will safely ambulate up and down at least 12 step using rail and S. Baseline: up to min A on steps Goal status: Met 03/17/23    PLAN:  PT FREQUENCY: 1-2x/week  PT DURATION: 10 weeks  PLANNED INTERVENTIONS: Therapeutic exercises, Therapeutic activity, Neuromuscular re-education, Balance training, Gait training, Patient/Family education, Self Care, Joint mobilization, Stair training, Dry Needling, Electrical stimulation, Spinal mobilization, Cryotherapy, Moist heat, Ionotophoresis 4mg /ml Dexamethasone, and Manual therapy  PLAN FOR NEXT SESSION:D/C  PHYSICAL THERAPY DISCHARGE SUMMARY  Visits from Start of Care: 14  Current functional level related to goals / functional outcomes:  Patient agrees to discharge. Patient goals were partially met. Patient is being discharged due to being pleased with the current functional level.  Oley Balm DPT 04/02/23 3:21 PM  Debroah Baller, PTA 04/02/23 1:07 PM

## 2023-04-03 DIAGNOSIS — N186 End stage renal disease: Secondary | ICD-10-CM | POA: Diagnosis not present

## 2023-04-03 DIAGNOSIS — N2581 Secondary hyperparathyroidism of renal origin: Secondary | ICD-10-CM | POA: Diagnosis not present

## 2023-04-03 DIAGNOSIS — Z992 Dependence on renal dialysis: Secondary | ICD-10-CM | POA: Diagnosis not present

## 2023-04-06 DIAGNOSIS — Z992 Dependence on renal dialysis: Secondary | ICD-10-CM | POA: Diagnosis not present

## 2023-04-06 DIAGNOSIS — N2581 Secondary hyperparathyroidism of renal origin: Secondary | ICD-10-CM | POA: Diagnosis not present

## 2023-04-06 DIAGNOSIS — N186 End stage renal disease: Secondary | ICD-10-CM | POA: Diagnosis not present

## 2023-04-08 DIAGNOSIS — Z992 Dependence on renal dialysis: Secondary | ICD-10-CM | POA: Diagnosis not present

## 2023-04-08 DIAGNOSIS — N2581 Secondary hyperparathyroidism of renal origin: Secondary | ICD-10-CM | POA: Diagnosis not present

## 2023-04-08 DIAGNOSIS — N186 End stage renal disease: Secondary | ICD-10-CM | POA: Diagnosis not present

## 2023-04-09 DIAGNOSIS — E7800 Pure hypercholesterolemia, unspecified: Secondary | ICD-10-CM | POA: Diagnosis not present

## 2023-04-09 DIAGNOSIS — I10 Essential (primary) hypertension: Secondary | ICD-10-CM | POA: Diagnosis not present

## 2023-04-09 DIAGNOSIS — Z0100 Encounter for examination of eyes and vision without abnormal findings: Secondary | ICD-10-CM | POA: Diagnosis not present

## 2023-04-09 DIAGNOSIS — E119 Type 2 diabetes mellitus without complications: Secondary | ICD-10-CM | POA: Diagnosis not present

## 2023-04-09 DIAGNOSIS — H524 Presbyopia: Secondary | ICD-10-CM | POA: Diagnosis not present

## 2023-04-09 LAB — HM DIABETES EYE EXAM

## 2023-04-10 DIAGNOSIS — Z992 Dependence on renal dialysis: Secondary | ICD-10-CM | POA: Diagnosis not present

## 2023-04-10 DIAGNOSIS — N2581 Secondary hyperparathyroidism of renal origin: Secondary | ICD-10-CM | POA: Diagnosis not present

## 2023-04-10 DIAGNOSIS — N186 End stage renal disease: Secondary | ICD-10-CM | POA: Diagnosis not present

## 2023-04-11 DIAGNOSIS — I129 Hypertensive chronic kidney disease with stage 1 through stage 4 chronic kidney disease, or unspecified chronic kidney disease: Secondary | ICD-10-CM | POA: Diagnosis not present

## 2023-04-11 DIAGNOSIS — N186 End stage renal disease: Secondary | ICD-10-CM | POA: Diagnosis not present

## 2023-04-11 DIAGNOSIS — Z992 Dependence on renal dialysis: Secondary | ICD-10-CM | POA: Diagnosis not present

## 2023-04-13 DIAGNOSIS — Z992 Dependence on renal dialysis: Secondary | ICD-10-CM | POA: Diagnosis not present

## 2023-04-13 DIAGNOSIS — N186 End stage renal disease: Secondary | ICD-10-CM | POA: Diagnosis not present

## 2023-04-13 DIAGNOSIS — N2581 Secondary hyperparathyroidism of renal origin: Secondary | ICD-10-CM | POA: Diagnosis not present

## 2023-04-15 ENCOUNTER — Other Ambulatory Visit: Payer: Self-pay | Admitting: Family Medicine

## 2023-04-15 DIAGNOSIS — E1149 Type 2 diabetes mellitus with other diabetic neurological complication: Secondary | ICD-10-CM

## 2023-04-15 DIAGNOSIS — N186 End stage renal disease: Secondary | ICD-10-CM | POA: Diagnosis not present

## 2023-04-15 DIAGNOSIS — Z992 Dependence on renal dialysis: Secondary | ICD-10-CM | POA: Diagnosis not present

## 2023-04-15 DIAGNOSIS — E1122 Type 2 diabetes mellitus with diabetic chronic kidney disease: Secondary | ICD-10-CM

## 2023-04-15 DIAGNOSIS — N2581 Secondary hyperparathyroidism of renal origin: Secondary | ICD-10-CM | POA: Diagnosis not present

## 2023-04-16 ENCOUNTER — Other Ambulatory Visit: Payer: Self-pay

## 2023-04-16 ENCOUNTER — Ambulatory Visit: Payer: Medicare HMO | Attending: Family Medicine | Admitting: Physician Assistant

## 2023-04-16 ENCOUNTER — Other Ambulatory Visit (HOSPITAL_COMMUNITY): Payer: Self-pay

## 2023-04-16 ENCOUNTER — Encounter: Payer: Self-pay | Admitting: Physician Assistant

## 2023-04-16 VITALS — BP 120/60 | HR 70 | Wt 159.2 lb

## 2023-04-16 DIAGNOSIS — E1149 Type 2 diabetes mellitus with other diabetic neurological complication: Secondary | ICD-10-CM | POA: Diagnosis not present

## 2023-04-16 DIAGNOSIS — N186 End stage renal disease: Secondary | ICD-10-CM

## 2023-04-16 DIAGNOSIS — I10 Essential (primary) hypertension: Secondary | ICD-10-CM

## 2023-04-16 DIAGNOSIS — N184 Chronic kidney disease, stage 4 (severe): Secondary | ICD-10-CM

## 2023-04-16 DIAGNOSIS — I129 Hypertensive chronic kidney disease with stage 1 through stage 4 chronic kidney disease, or unspecified chronic kidney disease: Secondary | ICD-10-CM

## 2023-04-16 DIAGNOSIS — E1122 Type 2 diabetes mellitus with diabetic chronic kidney disease: Secondary | ICD-10-CM | POA: Diagnosis not present

## 2023-04-16 DIAGNOSIS — I5032 Chronic diastolic (congestive) heart failure: Secondary | ICD-10-CM

## 2023-04-16 DIAGNOSIS — I651 Occlusion and stenosis of basilar artery: Secondary | ICD-10-CM

## 2023-04-16 DIAGNOSIS — E1165 Type 2 diabetes mellitus with hyperglycemia: Secondary | ICD-10-CM | POA: Diagnosis not present

## 2023-04-16 DIAGNOSIS — G4709 Other insomnia: Secondary | ICD-10-CM

## 2023-04-16 DIAGNOSIS — Z7984 Long term (current) use of oral hypoglycemic drugs: Secondary | ICD-10-CM | POA: Diagnosis not present

## 2023-04-16 LAB — POCT GLYCOSYLATED HEMOGLOBIN (HGB A1C): HbA1c, POC (controlled diabetic range): 6.5 % (ref 0.0–7.0)

## 2023-04-16 LAB — GLUCOSE, POCT (MANUAL RESULT ENTRY): POC Glucose: 158 mg/dL — AB (ref 70–99)

## 2023-04-16 MED ORDER — ALBUTEROL SULFATE HFA 108 (90 BASE) MCG/ACT IN AERS
1.0000 | INHALATION_SPRAY | Freq: Four times a day (QID) | RESPIRATORY_TRACT | 2 refills | Status: DC | PRN
  Filled 2023-04-16: qty 6.7, 25d supply, fill #0
  Filled 2023-04-16: qty 17, fill #0
  Filled 2023-05-12: qty 6.7, 25d supply, fill #1

## 2023-04-16 MED ORDER — AMLODIPINE BESYLATE 5 MG PO TABS
5.0000 mg | ORAL_TABLET | Freq: Every day | ORAL | 0 refills | Status: DC
Start: 2023-04-16 — End: 2023-09-02
  Filled 2023-04-16 – 2023-07-16 (×3): qty 90, 90d supply, fill #0

## 2023-04-16 MED ORDER — CARVEDILOL 12.5 MG PO TABS
12.5000 mg | ORAL_TABLET | Freq: Two times a day (BID) | ORAL | 1 refills | Status: DC
  Filled 2023-04-16 – 2023-05-27 (×3): qty 180, 90d supply, fill #0
  Filled 2023-09-02: qty 180, 90d supply, fill #1

## 2023-04-16 MED ORDER — GLIPIZIDE ER 2.5 MG PO TB24
2.5000 mg | ORAL_TABLET | Freq: Every day | ORAL | 0 refills | Status: DC
Start: 2023-04-16 — End: 2023-07-16
  Filled 2023-04-16 – 2023-06-13 (×4): qty 90, 90d supply, fill #0

## 2023-04-16 MED ORDER — AMLODIPINE BESYLATE 5 MG PO TABS
5.0000 mg | ORAL_TABLET | Freq: Every day | ORAL | 0 refills | Status: DC
  Filled 2023-04-16: qty 90, 90d supply, fill #0

## 2023-04-16 MED ORDER — ATORVASTATIN CALCIUM 80 MG PO TABS
80.0000 mg | ORAL_TABLET | Freq: Every day | ORAL | 0 refills | Status: DC
Start: 2023-04-16 — End: 2023-04-16
  Filled 2023-04-16: qty 90, 90d supply, fill #0

## 2023-04-16 MED ORDER — ACCU-CHEK GUIDE VI STRP
ORAL_STRIP | 2 refills | Status: DC
Start: 2023-04-16 — End: 2023-12-09

## 2023-04-16 MED ORDER — ONDANSETRON HCL 4 MG PO TABS
4.0000 mg | ORAL_TABLET | Freq: Three times a day (TID) | ORAL | 0 refills | Status: DC | PRN
  Filled 2023-04-16 (×2): qty 20, 7d supply, fill #0

## 2023-04-16 MED ORDER — ATORVASTATIN CALCIUM 80 MG PO TABS
80.0000 mg | ORAL_TABLET | Freq: Every day | ORAL | 0 refills | Status: DC
  Filled 2023-04-16 – 2023-05-27 (×3): qty 90, 90d supply, fill #0

## 2023-04-16 MED ORDER — ISOSORBIDE MONONITRATE ER 60 MG PO TB24
60.0000 mg | ORAL_TABLET | Freq: Every day | ORAL | 0 refills | Status: DC
Start: 2023-04-16 — End: 2023-07-16
  Filled 2023-04-16 – 2023-05-27 (×3): qty 90, 90d supply, fill #0

## 2023-04-16 MED ORDER — ISOSORBIDE MONONITRATE ER 60 MG PO TB24
60.0000 mg | ORAL_TABLET | Freq: Every day | ORAL | 0 refills | Status: DC
  Filled 2023-04-16: qty 90, 90d supply, fill #0

## 2023-04-16 MED ORDER — TRAZODONE HCL 50 MG PO TABS
25.0000 mg | ORAL_TABLET | Freq: Every day | ORAL | 2 refills | Status: DC
  Filled 2023-04-16: qty 30, 30d supply, fill #0
  Filled 2023-04-16: qty 30, fill #0
  Filled 2023-05-12: qty 30, 30d supply, fill #1

## 2023-04-16 NOTE — Progress Notes (Signed)
Patient ID: Paula [name], female   DOB: Oct 07, 1945, 77 y.o.   MRN: [id number]   Paula [name], is a 77 y.o. female  [id number]  [id number]  DOB - 1945/09/03  Chief Complaint  Patient presents with   Diabetes   Medication Refill       Subjective:   Paula [name] is a 77 y.o. female here today for med RF and diabetes check, gfr and urine albumin.  She is ESRD on HD M, W,F.  She has occasional nausea after dialysis.  She has that yesterday.  Does not feel as though she can urinate currently.  No other issues or concerns. Her daughter is with her.  She has plenty of eliquis and gabapentin  No problems updated.  ALLERGIES: Allergies  Allergen Reactions   Dust Mite Extract Cough   Peanut-Containing Drug Products Cough   Ace Inhibitors Cough   Lovastatin Other (See Comments)    Generalized body pain    PAST MEDICAL HISTORY: Past Medical History:  Diagnosis Date   Allergic rhinitis    Arthritis    Asthma    Brain aneurysm    Chronic kidney disease    Cough    Diabetes mellitus    Diastolic CHF, chronic (HCC) 10/11/2011   GERD (gastroesophageal reflux disease)    History of colon polyps 2012   tubular adenoma    Hyperlipidemia    Hypertension    Neuropathy 10/11/2011   PONV (postoperative nausea and vomiting)    one time after lymph node surgery    MEDICATIONS AT HOME: Prior to Admission medications   Medication Sig Start Date End Date Taking? Authorizing Provider  acetaminophen (TYLENOL) 500 MG tablet Take 500 mg by mouth every 6 (six) hours as needed for fever, moderate pain or mild pain.   Yes [provider]  albuterol (PROVENTIL) (2.5 MG/3ML) 0.083% nebulizer solution Inhale 3 mLs (1 vial) by nebulization every 6 hours as needed for wheezing or shortness of breath. 03/04/23  Yes Newlin, Enobong, MD  apixaban (ELIQUIS) 5 MG TABS tablet Take 1 tablet (5 mg total) by mouth 2 (two) times daily. 04/02/23  Yes Hoy Register, MD  azelastine  (ASTELIN) 0.1 % nasal spray Place 2 sprays into both nostrils 2 (two) times daily. Use in each nostril as directed 01/08/23  Yes Hoy Register, MD  calcium acetate (PHOSLO) 667 MG capsule Take 1 capsule by mouth 3 times daily 10/31/21  Yes Penninger, Lillia Abed, [place]  calcium acetate (PHOSLO) 667 MG capsule Take 1 capsule (667 mg total) by mouth 3 (three) times daily with meals. 01/14/23  Yes   ethyl chloride spray Apply a small amount on skin immediately before cannulation 01/09/22  Yes Dagoberto Ligas, MD  fluticasone [facility]) 50 MCG/ACT nasal spray Place 2 sprays into both nostrils daily. 01/08/23  Yes Hoy Register, MD  gabapentin (NEURONTIN) 100 MG capsule Take 1 capsule (100 mg total) by mouth 3 (three) times daily. 11/25/22  Yes Hoy Register, MD  Homeopathic Products (EARACHE DROPS OT) Place 1 drop into both ears daily.   Yes [provider]  lidocaine-prilocaine (EMLA) cream Apply a small amount to dialysis access one hour prior to dialysis. Wrap with plastic wrap. 12/10/21  Yes   loratadine (CLARITIN) 10 MG tablet Take 1 tablet (10 mg total) by mouth daily. 01/07/23  Yes Hoy Register, MD  Misc. Devices [employer]  Diagnosis - unstable gait 03/05/21  Yes Hoy Register, MD  Misc. Devices MISC Fistula sleeve for R arm.  Diagnoses: End-stage renal disease on hemodialysis 01/09/22  Yes Hoy Register, MD  Misc. Devices MISC 1. Incontinence supplies.  2. Briefs.  Diagnosis-urinary incontinence 04/17/22  Yes Hoy Register, MD  Misc. Devices MISC Nepro dialysis protein drink.  Diagnosis end-stage renal disease 05/16/22  Yes Hoy Register, MD  Misc. Devices MISC Blood pressure monitor.  Diagnosis hypertension 05/20/22  Yes Newlin, Enobong, MD  olopatadine (PATANOL) 0.1 % ophthalmic solution Place 1 drop into both eyes 2 (two) times daily. 08/14/22  Yes Storm Frisk, MD  ondansetron (ZOFRAN) 4 MG tablet Take 1 tablet (4 mg total) by mouth every 8 (eight) hours as needed for nausea or vomiting.  04/16/23  Yes Graycen Sadlon, Marzella Schlein, PA-C  Polyethyl Glycol-Propyl Glycol (SYSTANE OP) Place 1 drop into both eyes daily.   Yes [provider]  tiZANidine (ZANAFLEX) 4 MG tablet Take 1 tablet (4 mg total) by mouth every other day. As needed before hemodialysis sessions for muscle spasms 01/08/23  Yes Newlin, Odette Horns, MD  albuterol (PROAIR HFA) 108 (90 Base) MCG/ACT inhaler Inhale 1-2 puffs into the lungs every 6 (six) hours as needed for wheezing or shortness of breath. 04/16/23   Anders Simmonds, PA-C  amLODipine (NORVASC) 5 MG tablet Take 1 tablet (5 mg total) by mouth daily. 04/16/23   Anders Simmonds, PA-C  atorvastatin (LIPITOR) 80 MG tablet Take 1 tablet (80 mg total) by mouth daily. 04/16/23   Anders Simmonds, PA-C  benzonatate (TESSALON) 100 MG capsule Take 1 capsule (100 mg total) by mouth 2 (two) times daily as needed for cough. 01/08/23   Hoy Register, MD  carvedilol (COREG) 12.5 MG tablet Take 1 tablet (12.5 mg total) by mouth 2 (two) times daily with a meal. (Take once a day in the evening on hemodialysis days) 04/16/23   Anders Simmonds, PA-C  diclofenac Sodium (VOLTAREN) 1 % GEL Apply 4 g topically 4 (four) times daily. 01/08/23   Hoy Register, MD  glipiZIDE (GLUCOTROL XL) 2.5 MG 24 hr tablet Take 1 tablet (2.5 mg total) by mouth daily with breakfast. (needs office visit) 04/16/23   Anders Simmonds, PA-C  glucose blood (ACCU-CHEK GUIDE) test strip USE AS INSTRUCTED 2 TIMES DAILY 04/16/23   Anders Simmonds, PA-C  isosorbide mononitrate (IMDUR) 60 MG 24 hr tablet Take 1 tablet (60 mg total) by mouth daily. 04/16/23   Anders Simmonds, PA-C  meclizine (ANTIVERT) 12.5 MG tablet Take 1 tablet (12.5 mg total) by mouth 3 (three) times daily as needed for dizziness. 02/03/23   Carroll Sage, PA-C  traZODone (DESYREL) 50 MG tablet Take 0.5-1 tablets (25-50 mg total) by mouth 30 minutes to an hour before bedtime. 04/16/23   Anders Simmonds, PA-C  sodium chloride (OCEAN) 0.65 % SOLN  nasal spray Place 2 sprays into both nostrils as needed for congestion. Patient not taking: No sig reported 03/21/19 02/21/21  Glenford Bayley, NP    ROS: Neg HEENT Neg resp Neg cardiac Neg GI Neg GU Neg MS Neg psych Neg neuro  Objective:   Vitals:   04/16/23 1413 04/16/23 1442  BP: (!) 165/78 120/60  Pulse: 70   SpO2: 98%   Weight: 159 lb 3.2 oz (72.2 kg)    Exam General appearance : Awake, alert, not in any distress. Speech Clear. Not toxic looking HEENT: Atraumatic and Normocephalic Neck: Supple, no JVD. No cervical lymphadenopathy.  Chest: Good air entry bilaterally, CTAB.  No rales/rhonchi/wheezing CVS: S1 S2 regular, no murmurs.  Extremities: B/L Lower Ext shows no edema, both legs are warm to touch Neurology: Awake alert, and oriented X 3, CN II-XII intact, Non focal Skin: No Rash  Data Review Lab Results  Component Value Date   HGBA1C 6.5 04/16/2023   HGBA1C 6.6 11/20/2022   HGBA1C 6.9 05/20/2022    Assessment & Plan   1. Type 2 diabetes mellitus with hyperglycemia, unspecified whether long term insulin use (HCC) Improved.  Continue current regimen - Glucose (CBG) - HgB A1c - Microalbumin / creatinine urine ratio - Creatinine with Est GFR - glipiZIDE (GLUCOTROL XL) 2.5 MG 24 hr tablet; Take 1 tablet (2.5 mg total) by mouth daily with breakfast. (needs office visit)  Dispense: 90 tablet; Refill: 0  2. Essential hypertension Controlled(second reading) - amLODipine (NORVASC) 5 MG tablet; Take 1 tablet (5 mg total) by mouth daily.  Dispense: 90 tablet; Refill: 0  3. ESRD (end stage renal disease) (HCC) M,W,F - Microalbumin / creatinine urine ratio - Creatinine with Est GFR  4. Type 2 diabetes mellitus with other neurologic complication, without long-term current use of insulin (HCC) - glucose blood (ACCU-CHEK GUIDE) test strip; USE AS INSTRUCTED 2 TIMES DAILY  Dispense: 100 each; Refill: 2 - atorvastatin (LIPITOR) 80 MG tablet; Take 1 tablet (80 mg  total) by mouth daily.  Dispense: 90 tablet; Refill: 0  5. Basilar artery stenosis - carvedilol (COREG) 12.5 MG tablet; Take 1 tablet (12.5 mg total) by mouth 2 (two) times daily with a meal. (Take once a day in the evening on hemodialysis days)  Dispense: 180 tablet; Refill: 1  6. 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  Dispense: 90 tablet; Refill: 0  7. Other insomnia - traZODone (DESYREL) 50 MG tablet; Take 0.5-1 tablets (25-50 mg total) by mouth 30 minutes to an hour before bedtime.  Dispense: 30 tablet; Refill: 2  8. Chronic diastolic CHF (congestive heart failure) (HCC) - isosorbide mononitrate (IMDUR) 60 MG 24 hr tablet; Take 1 tablet (60 mg total) by mouth daily.  Dispense: 90 tablet; Refill: 0    Return in about 4 months (around 08/16/2023) for PCP for chronic conditions-newlin.  The patient was given clear instructions to go to ER or return to medical center if symptoms don't improve, worsen or new problems develop. The patient verbalized understanding. The patient was told to call to get lab results if they haven't heard anything in the next week.      [place]ian Co, PA-C [facility] [place], [place] [phone]   04/16/2023, 2:53 PM

## 2023-04-17 ENCOUNTER — Other Ambulatory Visit: Payer: Self-pay

## 2023-04-17 ENCOUNTER — Other Ambulatory Visit (HOSPITAL_COMMUNITY): Payer: Self-pay

## 2023-04-17 DIAGNOSIS — N2581 Secondary hyperparathyroidism of renal origin: Secondary | ICD-10-CM | POA: Diagnosis not present

## 2023-04-17 DIAGNOSIS — Z992 Dependence on renal dialysis: Secondary | ICD-10-CM | POA: Diagnosis not present

## 2023-04-17 DIAGNOSIS — N186 End stage renal disease: Secondary | ICD-10-CM | POA: Diagnosis not present

## 2023-04-20 DIAGNOSIS — Z992 Dependence on renal dialysis: Secondary | ICD-10-CM | POA: Diagnosis not present

## 2023-04-20 DIAGNOSIS — N2581 Secondary hyperparathyroidism of renal origin: Secondary | ICD-10-CM | POA: Diagnosis not present

## 2023-04-20 DIAGNOSIS — N186 End stage renal disease: Secondary | ICD-10-CM | POA: Diagnosis not present

## 2023-04-21 ENCOUNTER — Ambulatory Visit: Payer: Medicare HMO | Admitting: Emergency Medicine

## 2023-04-21 ENCOUNTER — Other Ambulatory Visit (HOSPITAL_COMMUNITY): Payer: Self-pay

## 2023-04-22 DIAGNOSIS — N186 End stage renal disease: Secondary | ICD-10-CM | POA: Diagnosis not present

## 2023-04-22 DIAGNOSIS — Z992 Dependence on renal dialysis: Secondary | ICD-10-CM | POA: Diagnosis not present

## 2023-04-22 DIAGNOSIS — N2581 Secondary hyperparathyroidism of renal origin: Secondary | ICD-10-CM | POA: Diagnosis not present

## 2023-04-23 ENCOUNTER — Telehealth: Payer: Self-pay | Admitting: *Deleted

## 2023-04-23 NOTE — Telephone Encounter (Signed)
Copied from CRM [id number]. Topic: General - Inquiry >> Apr 23, 2023 10:43 AM Lennox Pippins wrote: Patient called and stated she missed a call, no documentation of anyone calling patient. Patient stated she does not have voicemail,  Please advise   Patients #  [phone]

## 2023-04-24 DIAGNOSIS — Z992 Dependence on renal dialysis: Secondary | ICD-10-CM | POA: Diagnosis not present

## 2023-04-24 DIAGNOSIS — N2581 Secondary hyperparathyroidism of renal origin: Secondary | ICD-10-CM | POA: Diagnosis not present

## 2023-04-24 DIAGNOSIS — N186 End stage renal disease: Secondary | ICD-10-CM | POA: Diagnosis not present

## 2023-04-27 DIAGNOSIS — N186 End stage renal disease: Secondary | ICD-10-CM | POA: Diagnosis not present

## 2023-04-27 DIAGNOSIS — N2581 Secondary hyperparathyroidism of renal origin: Secondary | ICD-10-CM | POA: Diagnosis not present

## 2023-04-27 DIAGNOSIS — Z992 Dependence on renal dialysis: Secondary | ICD-10-CM | POA: Diagnosis not present

## 2023-04-28 NOTE — Telephone Encounter (Signed)
Do not see where anyone called patient for results or upcoming appointments however there are some pending labs from 04/16/2023. Left message on voicemail for patient to return to the office for those appointments.

## 2023-04-28 NOTE — Telephone Encounter (Signed)
Pt called back about earlier call. There are no result noted form labs done on 04/16/23. Nor do I see anything about appointments. Unable to give results without the doctors interpretation. Please call pt back when results notes are available.

## 2023-04-28 NOTE — Telephone Encounter (Signed)
noted 

## 2023-04-29 DIAGNOSIS — Z992 Dependence on renal dialysis: Secondary | ICD-10-CM | POA: Diagnosis not present

## 2023-04-29 DIAGNOSIS — N2581 Secondary hyperparathyroidism of renal origin: Secondary | ICD-10-CM | POA: Diagnosis not present

## 2023-04-29 DIAGNOSIS — N186 End stage renal disease: Secondary | ICD-10-CM | POA: Diagnosis not present

## 2023-05-01 DIAGNOSIS — Z992 Dependence on renal dialysis: Secondary | ICD-10-CM | POA: Diagnosis not present

## 2023-05-01 DIAGNOSIS — N186 End stage renal disease: Secondary | ICD-10-CM | POA: Diagnosis not present

## 2023-05-01 DIAGNOSIS — N2581 Secondary hyperparathyroidism of renal origin: Secondary | ICD-10-CM | POA: Diagnosis not present

## 2023-05-04 ENCOUNTER — Ambulatory Visit: Payer: Medicare HMO | Admitting: Family Medicine

## 2023-05-04 DIAGNOSIS — N186 End stage renal disease: Secondary | ICD-10-CM | POA: Diagnosis not present

## 2023-05-04 DIAGNOSIS — N2581 Secondary hyperparathyroidism of renal origin: Secondary | ICD-10-CM | POA: Diagnosis not present

## 2023-05-04 DIAGNOSIS — Z992 Dependence on renal dialysis: Secondary | ICD-10-CM | POA: Diagnosis not present

## 2023-05-05 ENCOUNTER — Other Ambulatory Visit (HOSPITAL_COMMUNITY): Payer: Self-pay

## 2023-05-05 ENCOUNTER — Other Ambulatory Visit (HOSPITAL_BASED_OUTPATIENT_CLINIC_OR_DEPARTMENT_OTHER): Payer: Self-pay

## 2023-05-05 ENCOUNTER — Other Ambulatory Visit: Payer: Self-pay

## 2023-05-05 ENCOUNTER — Other Ambulatory Visit: Payer: Self-pay | Admitting: Family Medicine

## 2023-05-05 MED ORDER — LORATADINE 10 MG PO TABS
10.0000 mg | ORAL_TABLET | Freq: Every day | ORAL | 0 refills | Status: DC
  Filled 2023-05-05: qty 90, 90d supply, fill #0

## 2023-05-06 ENCOUNTER — Other Ambulatory Visit (HOSPITAL_COMMUNITY): Payer: Self-pay

## 2023-05-06 DIAGNOSIS — N2581 Secondary hyperparathyroidism of renal origin: Secondary | ICD-10-CM | POA: Diagnosis not present

## 2023-05-06 DIAGNOSIS — N186 End stage renal disease: Secondary | ICD-10-CM | POA: Diagnosis not present

## 2023-05-06 DIAGNOSIS — Z992 Dependence on renal dialysis: Secondary | ICD-10-CM | POA: Diagnosis not present

## 2023-05-07 ENCOUNTER — Other Ambulatory Visit: Payer: Self-pay | Admitting: Family Medicine

## 2023-05-07 NOTE — Telephone Encounter (Signed)
Medication Refill - Medication: loratadine (CLARITIN) 10 MG tablet [[id number]]   Has the patient contacted their pharmacy? Yes.     (Agent: If yes, when and what did the pharmacy advise?) Contact Office   Preferred Pharmacy (with phone number or street name): [facility]   Has the patient been seen for an appointment in the last year OR does the patient have an upcoming appointment? Yes.    Agent: Please be advised that RX refills may take up to 3 business days. We ask that you follow-up with your pharmacy.

## 2023-05-08 ENCOUNTER — Other Ambulatory Visit: Payer: Self-pay

## 2023-05-08 ENCOUNTER — Encounter (HOSPITAL_COMMUNITY): Payer: Self-pay

## 2023-05-08 DIAGNOSIS — N2581 Secondary hyperparathyroidism of renal origin: Secondary | ICD-10-CM | POA: Diagnosis not present

## 2023-05-08 DIAGNOSIS — Z992 Dependence on renal dialysis: Secondary | ICD-10-CM | POA: Diagnosis not present

## 2023-05-08 DIAGNOSIS — N186 End stage renal disease: Secondary | ICD-10-CM | POA: Diagnosis not present

## 2023-05-08 MED ORDER — LORATADINE 10 MG PO TABS
10.0000 mg | ORAL_TABLET | Freq: Every day | ORAL | 1 refills | Status: DC
  Filled 2023-05-08: qty 90, 90d supply, fill #0
  Filled 2023-07-16: qty 90, 90d supply, fill #1

## 2023-05-08 NOTE — Telephone Encounter (Signed)
Requested Prescriptions  Pending Prescriptions Disp Refills   loratadine (CLARITIN) 10 MG tablet 90 tablet 1    Sig: Take 1 tablet (10 mg total) by mouth daily.     Ear, Nose, and Throat:  Antihistamines 2 Failed - 05/07/2023  3:37 PM      Failed - Cr in normal range and within 360 days    Creat  Date Value Ref Range Status  06/27/2016 1.52 (H) 0.50 - 0.99 mg/dL Final    Comment:      For patients > or = 76 years of age: The upper reference limit for Creatinine is approximately 13% higher for people identified as African-American.      Creatinine, Ser  Date Value Ref Range Status  02/02/2023 5.00 (H) 0.44 - 1.00 mg/dL Final   Creatinine, Urine  Date Value Ref Range Status  10/14/2021 58.95 mg/dL Final    Comment:    Performed at [facility], [address] [place], [place] [address]         Passed - Valid encounter within last 12 months    Recent Outpatient Visits           3 weeks ago Type 2 diabetes mellitus with hyperglycemia, unspecified whether long term insulin use [facility])   [facility] [place], [place], [place]   4 months ago Muscle spasm   [facility] [place], Odette Horns, MD   5 months ago Essential hypertension   [facility] Storm Frisk, MD   8 months ago Encounter for [employer] annual wellness exam   [place] [facility] Hoy Register, MD   8 months ago Allergic conjunctivitis and rhinitis, bilateral   [facility] Storm Frisk, MD

## 2023-05-11 DIAGNOSIS — N186 End stage renal disease: Secondary | ICD-10-CM | POA: Diagnosis not present

## 2023-05-11 DIAGNOSIS — N2581 Secondary hyperparathyroidism of renal origin: Secondary | ICD-10-CM | POA: Diagnosis not present

## 2023-05-11 DIAGNOSIS — I129 Hypertensive chronic kidney disease with stage 1 through stage 4 chronic kidney disease, or unspecified chronic kidney disease: Secondary | ICD-10-CM | POA: Diagnosis not present

## 2023-05-11 DIAGNOSIS — Z992 Dependence on renal dialysis: Secondary | ICD-10-CM | POA: Diagnosis not present

## 2023-05-12 ENCOUNTER — Other Ambulatory Visit: Payer: Self-pay

## 2023-05-13 DIAGNOSIS — N2581 Secondary hyperparathyroidism of renal origin: Secondary | ICD-10-CM | POA: Diagnosis not present

## 2023-05-13 DIAGNOSIS — N186 End stage renal disease: Secondary | ICD-10-CM | POA: Diagnosis not present

## 2023-05-13 DIAGNOSIS — Z992 Dependence on renal dialysis: Secondary | ICD-10-CM | POA: Diagnosis not present

## 2023-05-15 DIAGNOSIS — N186 End stage renal disease: Secondary | ICD-10-CM | POA: Diagnosis not present

## 2023-05-15 DIAGNOSIS — Z992 Dependence on renal dialysis: Secondary | ICD-10-CM | POA: Diagnosis not present

## 2023-05-15 DIAGNOSIS — N2581 Secondary hyperparathyroidism of renal origin: Secondary | ICD-10-CM | POA: Diagnosis not present

## 2023-05-18 ENCOUNTER — Ambulatory Visit: Payer: Self-pay

## 2023-05-18 DIAGNOSIS — Z992 Dependence on renal dialysis: Secondary | ICD-10-CM | POA: Diagnosis not present

## 2023-05-18 DIAGNOSIS — N2581 Secondary hyperparathyroidism of renal origin: Secondary | ICD-10-CM | POA: Diagnosis not present

## 2023-05-18 DIAGNOSIS — N186 End stage renal disease: Secondary | ICD-10-CM | POA: Diagnosis not present

## 2023-05-18 NOTE — Telephone Encounter (Signed)
Call placed to patient unable to reach message left on VM.   

## 2023-05-18 NOTE — Telephone Encounter (Signed)
  Chief Complaint: Uncontrolled blood sugar Symptoms: Fastin BS of 197. Last night BS was 273, and 187 Frequency: ongoing Pertinent Negatives: Patient denies  Disposition: [] ED /[] Urgent Care (no appt availability in office) / [] Appointment(In office/virtual)/ []  [facility]/ [] Home Care/ [] Refused Recommended Disposition /[x] [facility]/ []  Follow-up with PCP Additional Notes: Pt states that her blood sugar readings are elevated. Yesterday she took 3 glipizide to bring down her BS. Pt in only prescribed 1. No appts in office. [facility].    Reason for Disposition  Blood glucose 70-240 mg/dL (3.9 -82.9 mmol/L)  Answer Assessment - Initial Assessment Questions 1. BLOOD GLUCOSE: "What is your blood glucose level?"      This morning it was 197.  2. ONSET: "When did you check the blood glucose?"     This morning 3. USUAL RANGE: "What is your glucose level usually?" (e.g., usual fasting morning value, usual evening value)     120 or less  5. TYPE 1 or 2:  "Do you know what type of diabetes you have?"  (e.g., Type 1, Type 2, Gestational; doesn't know)      Type 2 6. INSULIN: "Do you take insulin?" "What type of insulin(s) do you use? What is the mode of delivery? (syringe, pen; injection or pump)?"      no 7. DIABETES PILLS: "Do you take any pills for your diabetes?" If Yes, ask: "Have you missed taking any pills recently?"     Glipizide 5 mg - Pt took 3 glipizide 8. OTHER SYMPTOMS: "Do you have any symptoms?" (e.g., fever, frequent urination, difficulty breathing, dizziness, weakness, vomiting)     187 last evening  Protocols used: Diabetes - High Blood Sugar-A-AH

## 2023-05-19 ENCOUNTER — Encounter: Payer: Self-pay | Admitting: Physician Assistant

## 2023-05-19 ENCOUNTER — Ambulatory Visit: Payer: Medicare HMO | Admitting: Physician Assistant

## 2023-05-19 ENCOUNTER — Other Ambulatory Visit (HOSPITAL_COMMUNITY): Payer: Self-pay

## 2023-05-19 VITALS — BP 164/63 | HR 68 | Ht 63.0 in | Wt 160.0 lb

## 2023-05-19 DIAGNOSIS — N186 End stage renal disease: Secondary | ICD-10-CM

## 2023-05-19 DIAGNOSIS — G4701 Insomnia due to medical condition: Secondary | ICD-10-CM | POA: Diagnosis not present

## 2023-05-19 DIAGNOSIS — Z7984 Long term (current) use of oral hypoglycemic drugs: Secondary | ICD-10-CM

## 2023-05-19 DIAGNOSIS — Z992 Dependence on renal dialysis: Secondary | ICD-10-CM

## 2023-05-19 DIAGNOSIS — R739 Hyperglycemia, unspecified: Secondary | ICD-10-CM | POA: Diagnosis not present

## 2023-05-19 DIAGNOSIS — E1165 Type 2 diabetes mellitus with hyperglycemia: Secondary | ICD-10-CM

## 2023-05-19 LAB — GLUCOSE, POCT (MANUAL RESULT ENTRY): POC Glucose: 113 mg/dL — AB (ref 70–99)

## 2023-05-19 MED ORDER — MIRTAZAPINE 7.5 MG PO TABS
7.5000 mg | ORAL_TABLET | Freq: Every day | ORAL | 0 refills | Status: DC
Start: 2023-05-19 — End: 2023-07-29
  Filled 2023-05-19: qty 30, 30d supply, fill #0

## 2023-05-19 NOTE — Patient Instructions (Signed)
I started a referral for you to be seen by endocrinology as well as nephrology.  They will reach out to you to schedule an appointment.  In the meantime I encourage you to continue checking your fasting blood sugar levels, if you are having an elevated FASTING blood sugar level, it is okay to take a second glipizide if needed.  I would wait at least 2 hours after eating prior to rechecking your blood sugar levels.  To help with your occasional constipation, I encourage you to use MiraLAX, and make sure that you are eating at least 30 g of fiber on a daily basis.  To help with your sleep, you will start Remeron 7.5 mg at bedtime.  Please return to the mobile unit in approximately 4 weeks for follow-up.  We will call you to let you know our location.  Roney Jaffe, PA-C Physician Assistant [facility] [email or web address]   Insomnia Insomnia is a sleep disorder that makes it difficult to fall asleep or stay asleep. Insomnia can cause fatigue, low energy, difficulty concentrating, mood swings, and poor performance at work or school. There are three different ways to classify insomnia: Difficulty falling asleep. Difficulty staying asleep. Waking up too early in the morning. Any type of insomnia can be long-term (chronic) or short-term (acute). Both are common. Short-term insomnia usually lasts for 3 months or less. Chronic insomnia occurs at least three times a week for longer than 3 months. What are the causes? Insomnia may be caused by another condition, situation, or substance, such as: Having certain mental health conditions, such as anxiety and depression. Using caffeine, alcohol, tobacco, or drugs. Having gastrointestinal conditions, such as gastroesophageal reflux disease (GERD). Having certain medical conditions. These include: Asthma. Alzheimer's disease. Stroke. Chronic pain. An overactive thyroid gland  (hyperthyroidism). Other sleep disorders, such as restless legs syndrome and sleep apnea. Menopause. Sometimes, the cause of insomnia may not be known. What increases the risk? Risk factors for insomnia include: Gender. Females are affected more often than males. Age. Insomnia is more common as people get older. Stress and certain medical and mental health conditions. Lack of exercise. Having an irregular work schedule. This may include working night shifts and traveling between different time zones. What are the signs or symptoms? If you have insomnia, the main symptom is having trouble falling asleep or having trouble staying asleep. This may lead to other symptoms, such as: Feeling tired or having low energy. Feeling nervous about going to sleep. Not feeling rested in the morning. Having trouble concentrating. Feeling irritable, anxious, or depressed. How is this diagnosed? This condition may be diagnosed based on: Your symptoms and medical history. Your health care provider may ask about: Your sleep habits. Any medical conditions you have. Your mental health. A physical exam. How is this treated? Treatment for insomnia depends on the cause. Treatment may focus on treating an underlying condition that is causing the insomnia. Treatment may also include: Medicines to help you sleep. Counseling or therapy. Lifestyle adjustments to help you sleep better. Follow these instructions at home: Eating and drinking  Limit or avoid alcohol, caffeinated beverages, and products that contain nicotine and tobacco, especially close to bedtime. These can disrupt your sleep. Do not eat a large meal or eat spicy foods right before bedtime. This can lead to digestive discomfort that can make it hard for you to sleep. Sleep habits  Keep a sleep diary to help you and your health care provider figure out what could  be causing your insomnia. Write down: When you sleep. When you wake up during the  night. How well you sleep and how rested you feel the next day. Any side effects of medicines you are taking. What you eat and drink. Make your bedroom a dark, comfortable place where it is easy to fall asleep. Put up shades or blackout curtains to block light from outside. Use a white noise machine to block noise. Keep the temperature cool. Limit screen use before bedtime. This includes: Not watching TV. Not using your smartphone, tablet, or computer. Stick to a routine that includes going to bed and waking up at the same times every day and night. This can help you fall asleep faster. Consider making a quiet activity, such as reading, part of your nighttime routine. Try to avoid taking naps during the day so that you sleep better at night. Get out of bed if you are still awake after 15 minutes of trying to sleep. Keep the lights down, but try reading or doing a quiet activity. When you feel sleepy, go back to bed. General instructions Take over-the-counter and prescription medicines only as told by your health care provider. Exercise regularly as told by your health care provider. However, avoid exercising in the hours right before bedtime. Use relaxation techniques to manage stress. Ask your health care provider to suggest some techniques that may work well for you. These may include: Breathing exercises. Routines to release muscle tension. Visualizing peaceful scenes. Make sure that you drive carefully. Do not drive if you feel very sleepy. Keep all follow-up visits. This is important. Contact a health care provider if: You are tired throughout the day. You have trouble in your daily routine due to sleepiness. You continue to have sleep problems, or your sleep problems get worse. Get help right away if: You have thoughts about hurting yourself or someone else. Get help right away if you feel like you may hurt yourself or others, or have thoughts about taking your own life. Go to your  nearest emergency room or: Call 911. Call the National Suicide Prevention Lifeline at 667-104-4932 or 988. This is open 24 hours a day. Text the Crisis Text Line at 573-066-8361. Summary Insomnia is a sleep disorder that makes it difficult to fall asleep or stay asleep. Insomnia can be long-term (chronic) or short-term (acute). Treatment for insomnia depends on the cause. Treatment may focus on treating an underlying condition that is causing the insomnia. Keep a sleep diary to help you and your health care provider figure out what could be causing your insomnia. This information is not intended to replace advice given to you by your health care provider. Make sure you discuss any questions you have with your health care provider. Document Revised: 07/08/2021 Document Reviewed: 07/08/2021 Elsevier Patient Education  2024 [employer].

## 2023-05-19 NOTE — Telephone Encounter (Signed)
Patient being seen on [facility] today

## 2023-05-19 NOTE — Telephone Encounter (Signed)
Call placed to patient unable to reach , unable to leave message, VM full.  SMS  message  sent.

## 2023-05-19 NOTE — Progress Notes (Unsigned)
Established Patient Office Visit  Subjective   Patient ID: Paula [name], female    DOB: 09-28-1945  Age: 77 y.o. MRN: [id number]  Chief Complaint  Patient presents with   Blood Sugar Problem    Fasting levels have been high in the past few weeks      One week every 4 months having elevated blood glucose   160 before breakfast after eating 270 - took a second Oatmeal   M/w/f    Sleep nothing  Blood pressure is dropping during dialyis  Stomach pains - constipated - hurts after eating - has tried miralax -2-3 times a month   Past Medical History:  Diagnosis Date   Allergic rhinitis    Arthritis    Asthma    Brain aneurysm    Chronic kidney disease    Cough    Diabetes mellitus    Diastolic CHF, chronic (HCC) 10/11/2011   GERD (gastroesophageal reflux disease)    History of colon polyps 2012   tubular adenoma    Hyperlipidemia    Hypertension    Neuropathy 10/11/2011   PONV (postoperative nausea and vomiting)    one time after lymph node surgery   Social History   Socioeconomic History   Marital status: Divorced    Spouse name: Not on file   Number of children: 5   Years of education: Not on file   Highest education level: Master's degree (e.g., MA, MS, MEng, MEd, MSW, MBA)  Occupational History   Occupation: Unemployed  Tobacco Use   Smoking status: Never    Passive exposure: Never   Smokeless tobacco: Never  Vaping Use   Vaping status: Never Used  Substance and Sexual Activity   Alcohol use: No   Drug use: No   Sexual activity: Not Currently    Birth control/protection: Post-menopausal, None  Other Topics Concern   Not on file  Social History Narrative   Lives alone.  5 children with two deceased.     Social Determinants of Health   Financial Resource Strain: Medium Risk (11/20/2022)   Overall Financial Resource Strain (CARDIA)    Difficulty of Paying Living Expenses: Somewhat hard  Food Insecurity: Food Insecurity Present (11/20/2022)    Hunger Vital Sign    Worried About Running Out of Food in the Last Year: Often true    Ran Out of Food in the Last Year: Sometimes true  Transportation Needs: Unmet Transportation Needs (11/20/2022)   PRAPARE - Administrator, Civil Service (Medical): Yes    Lack of Transportation (Non-Medical): Yes  Physical Activity: Insufficiently Active (11/20/2022)   Exercise Vital Sign    Days of Exercise per Week: 1 day    Minutes of Exercise per Session: 10 min  Stress: No Stress Concern Present (11/20/2022)   [employer] of Occupational Health - Occupational Stress Questionnaire    Feeling of Stress : Only a little  Social Connections: Moderately Integrated (11/20/2022)   Social Connection and Isolation Panel [NHANES]    Frequency of Communication with Friends and Family: More than three times a week    Frequency of Social Gatherings with Friends and Family: Twice a week    Attends Religious Services: More than 4 times per year    Active Member of [employer] or Organizations: Yes    Attends Banker Meetings: More than 4 times per year    Marital Status: Widowed  Recent Concern: Social Connections - Moderately Isolated (09/09/2022)   Social Connection  and Isolation Panel [NHANES]    Frequency of Communication with Friends and Family: Three times a week    Frequency of Social Gatherings with Friends and Family: Once a week    Attends Religious Services: 1 to 4 times per year    Active Member of [employer] or Organizations: No    Attends Banker Meetings: Never    Marital Status: Widowed  Intimate Partner Violence: Not At Risk (09/09/2022)   Humiliation, Afraid, Rape, and Kick questionnaire    Fear of Current or Ex-Partner: No    Emotionally Abused: No    Physically Abused: No    Sexually Abused: No   Family History  Problem Relation Age of Onset   Hypertension Mother    Asthma Sister    Colon cancer Neg Hx    Allergic rhinitis Neg Hx    Eczema Neg Hx     Allergies  Allergen Reactions   Dust Mite Extract Cough   Peanut-Containing Drug Products Cough   Ace Inhibitors Cough   Lovastatin Other (See Comments)    Generalized body pain    ROS    Objective:     BP (!) 164/63 (BP Location: Left Arm, Patient Position: Sitting, Cuff Size: Normal)   Pulse 68   Ht 5\' 3"  (1.6 m)   Wt 160 lb (72.6 kg)   LMP  (LMP Unknown)   SpO2 98%   BMI 28.34 kg/m  {Vitals History (Optional):23777}  Physical Exam   No results found for any visits on 05/19/23.  {Labs (Optional):23779}  The ASCVD Risk score (Arnett DK, et al., 2019) failed to calculate for the following reasons:   The valid total cholesterol range is 130 to 320 mg/dL    Assessment & Plan:   Problem List Items Addressed This Visit   None   No follow-ups on file.    Kasandra Knudsen Mayers, PA-C

## 2023-05-20 DIAGNOSIS — N186 End stage renal disease: Secondary | ICD-10-CM | POA: Diagnosis not present

## 2023-05-20 DIAGNOSIS — N2581 Secondary hyperparathyroidism of renal origin: Secondary | ICD-10-CM | POA: Diagnosis not present

## 2023-05-20 DIAGNOSIS — Z992 Dependence on renal dialysis: Secondary | ICD-10-CM | POA: Diagnosis not present

## 2023-05-21 ENCOUNTER — Encounter: Payer: Self-pay | Admitting: Physician Assistant

## 2023-05-22 DIAGNOSIS — Z992 Dependence on renal dialysis: Secondary | ICD-10-CM | POA: Diagnosis not present

## 2023-05-22 DIAGNOSIS — N2581 Secondary hyperparathyroidism of renal origin: Secondary | ICD-10-CM | POA: Diagnosis not present

## 2023-05-22 DIAGNOSIS — N186 End stage renal disease: Secondary | ICD-10-CM | POA: Diagnosis not present

## 2023-05-25 DIAGNOSIS — N186 End stage renal disease: Secondary | ICD-10-CM | POA: Diagnosis not present

## 2023-05-25 DIAGNOSIS — Z992 Dependence on renal dialysis: Secondary | ICD-10-CM | POA: Diagnosis not present

## 2023-05-25 DIAGNOSIS — N2581 Secondary hyperparathyroidism of renal origin: Secondary | ICD-10-CM | POA: Diagnosis not present

## 2023-05-27 ENCOUNTER — Other Ambulatory Visit: Payer: Self-pay

## 2023-05-27 DIAGNOSIS — N2581 Secondary hyperparathyroidism of renal origin: Secondary | ICD-10-CM | POA: Diagnosis not present

## 2023-05-27 DIAGNOSIS — N186 End stage renal disease: Secondary | ICD-10-CM | POA: Diagnosis not present

## 2023-05-27 DIAGNOSIS — Z992 Dependence on renal dialysis: Secondary | ICD-10-CM | POA: Diagnosis not present

## 2023-05-29 DIAGNOSIS — Z992 Dependence on renal dialysis: Secondary | ICD-10-CM | POA: Diagnosis not present

## 2023-05-29 DIAGNOSIS — N2581 Secondary hyperparathyroidism of renal origin: Secondary | ICD-10-CM | POA: Diagnosis not present

## 2023-05-29 DIAGNOSIS — N186 End stage renal disease: Secondary | ICD-10-CM | POA: Diagnosis not present

## 2023-06-01 DIAGNOSIS — N2581 Secondary hyperparathyroidism of renal origin: Secondary | ICD-10-CM | POA: Diagnosis not present

## 2023-06-01 DIAGNOSIS — N186 End stage renal disease: Secondary | ICD-10-CM | POA: Diagnosis not present

## 2023-06-01 DIAGNOSIS — Z992 Dependence on renal dialysis: Secondary | ICD-10-CM | POA: Diagnosis not present

## 2023-06-03 DIAGNOSIS — N2581 Secondary hyperparathyroidism of renal origin: Secondary | ICD-10-CM | POA: Diagnosis not present

## 2023-06-03 DIAGNOSIS — N186 End stage renal disease: Secondary | ICD-10-CM | POA: Diagnosis not present

## 2023-06-03 DIAGNOSIS — Z992 Dependence on renal dialysis: Secondary | ICD-10-CM | POA: Diagnosis not present

## 2023-06-05 DIAGNOSIS — N2581 Secondary hyperparathyroidism of renal origin: Secondary | ICD-10-CM | POA: Diagnosis not present

## 2023-06-05 DIAGNOSIS — Z992 Dependence on renal dialysis: Secondary | ICD-10-CM | POA: Diagnosis not present

## 2023-06-05 DIAGNOSIS — N186 End stage renal disease: Secondary | ICD-10-CM | POA: Diagnosis not present

## 2023-06-08 ENCOUNTER — Telehealth: Payer: Self-pay | Admitting: Family Medicine

## 2023-06-08 DIAGNOSIS — N186 End stage renal disease: Secondary | ICD-10-CM | POA: Diagnosis not present

## 2023-06-08 DIAGNOSIS — N2581 Secondary hyperparathyroidism of renal origin: Secondary | ICD-10-CM | POA: Diagnosis not present

## 2023-06-08 DIAGNOSIS — Z992 Dependence on renal dialysis: Secondary | ICD-10-CM | POA: Diagnosis not present

## 2023-06-08 NOTE — Telephone Encounter (Signed)
Copied from CRM [id number]. Topic: General - Other >> Jun 08, 2023 12:10 PM [place] B wrote: Reason for CRM: pt called in about referral for Nephrology, shows can't see diabetes pt's there. Please cb

## 2023-06-10 DIAGNOSIS — N2581 Secondary hyperparathyroidism of renal origin: Secondary | ICD-10-CM | POA: Diagnosis not present

## 2023-06-10 DIAGNOSIS — Z992 Dependence on renal dialysis: Secondary | ICD-10-CM | POA: Diagnosis not present

## 2023-06-10 DIAGNOSIS — N186 End stage renal disease: Secondary | ICD-10-CM | POA: Diagnosis not present

## 2023-06-11 DIAGNOSIS — I129 Hypertensive chronic kidney disease with stage 1 through stage 4 chronic kidney disease, or unspecified chronic kidney disease: Secondary | ICD-10-CM | POA: Diagnosis not present

## 2023-06-11 DIAGNOSIS — N186 End stage renal disease: Secondary | ICD-10-CM | POA: Diagnosis not present

## 2023-06-11 DIAGNOSIS — Z992 Dependence on renal dialysis: Secondary | ICD-10-CM | POA: Diagnosis not present

## 2023-06-12 DIAGNOSIS — N2581 Secondary hyperparathyroidism of renal origin: Secondary | ICD-10-CM | POA: Diagnosis not present

## 2023-06-12 DIAGNOSIS — N186 End stage renal disease: Secondary | ICD-10-CM | POA: Diagnosis not present

## 2023-06-12 DIAGNOSIS — Z992 Dependence on renal dialysis: Secondary | ICD-10-CM | POA: Diagnosis not present

## 2023-06-15 ENCOUNTER — Other Ambulatory Visit: Payer: Self-pay

## 2023-06-15 ENCOUNTER — Telehealth: Payer: Self-pay | Admitting: Family Medicine

## 2023-06-15 ENCOUNTER — Other Ambulatory Visit (HOSPITAL_COMMUNITY): Payer: Self-pay

## 2023-06-15 DIAGNOSIS — Z992 Dependence on renal dialysis: Secondary | ICD-10-CM | POA: Diagnosis not present

## 2023-06-15 DIAGNOSIS — N2581 Secondary hyperparathyroidism of renal origin: Secondary | ICD-10-CM | POA: Diagnosis not present

## 2023-06-15 DIAGNOSIS — N186 End stage renal disease: Secondary | ICD-10-CM | POA: Diagnosis not present

## 2023-06-15 NOTE — Telephone Encounter (Signed)
Contacted pt to inform her of [facility] locations to complete a follow up. Pt stated that she hasn't heard from the offices that she was referred to by [facility]. Pt doesn't feel the need to do a follow up before she has completed was what was advised to her

## 2023-06-17 DIAGNOSIS — Z992 Dependence on renal dialysis: Secondary | ICD-10-CM | POA: Diagnosis not present

## 2023-06-17 DIAGNOSIS — N186 End stage renal disease: Secondary | ICD-10-CM | POA: Diagnosis not present

## 2023-06-17 DIAGNOSIS — N2581 Secondary hyperparathyroidism of renal origin: Secondary | ICD-10-CM | POA: Diagnosis not present

## 2023-06-19 DIAGNOSIS — N186 End stage renal disease: Secondary | ICD-10-CM | POA: Diagnosis not present

## 2023-06-19 DIAGNOSIS — Z992 Dependence on renal dialysis: Secondary | ICD-10-CM | POA: Diagnosis not present

## 2023-06-19 DIAGNOSIS — N2581 Secondary hyperparathyroidism of renal origin: Secondary | ICD-10-CM | POA: Diagnosis not present

## 2023-06-22 DIAGNOSIS — N2581 Secondary hyperparathyroidism of renal origin: Secondary | ICD-10-CM | POA: Diagnosis not present

## 2023-06-22 DIAGNOSIS — Z992 Dependence on renal dialysis: Secondary | ICD-10-CM | POA: Diagnosis not present

## 2023-06-22 DIAGNOSIS — N186 End stage renal disease: Secondary | ICD-10-CM | POA: Diagnosis not present

## 2023-06-24 DIAGNOSIS — N186 End stage renal disease: Secondary | ICD-10-CM | POA: Diagnosis not present

## 2023-06-24 DIAGNOSIS — N2581 Secondary hyperparathyroidism of renal origin: Secondary | ICD-10-CM | POA: Diagnosis not present

## 2023-06-24 DIAGNOSIS — Z992 Dependence on renal dialysis: Secondary | ICD-10-CM | POA: Diagnosis not present

## 2023-06-26 DIAGNOSIS — N2581 Secondary hyperparathyroidism of renal origin: Secondary | ICD-10-CM | POA: Diagnosis not present

## 2023-06-26 DIAGNOSIS — Z992 Dependence on renal dialysis: Secondary | ICD-10-CM | POA: Diagnosis not present

## 2023-06-26 DIAGNOSIS — N186 End stage renal disease: Secondary | ICD-10-CM | POA: Diagnosis not present

## 2023-06-29 DIAGNOSIS — N186 End stage renal disease: Secondary | ICD-10-CM | POA: Diagnosis not present

## 2023-06-29 DIAGNOSIS — Z992 Dependence on renal dialysis: Secondary | ICD-10-CM | POA: Diagnosis not present

## 2023-06-29 DIAGNOSIS — N2581 Secondary hyperparathyroidism of renal origin: Secondary | ICD-10-CM | POA: Diagnosis not present

## 2023-06-30 ENCOUNTER — Encounter: Payer: Medicare HMO | Admitting: Family Medicine

## 2023-07-01 DIAGNOSIS — N186 End stage renal disease: Secondary | ICD-10-CM | POA: Diagnosis not present

## 2023-07-01 DIAGNOSIS — N2581 Secondary hyperparathyroidism of renal origin: Secondary | ICD-10-CM | POA: Diagnosis not present

## 2023-07-01 DIAGNOSIS — Z992 Dependence on renal dialysis: Secondary | ICD-10-CM | POA: Diagnosis not present

## 2023-07-03 DIAGNOSIS — N2581 Secondary hyperparathyroidism of renal origin: Secondary | ICD-10-CM | POA: Diagnosis not present

## 2023-07-03 DIAGNOSIS — Z992 Dependence on renal dialysis: Secondary | ICD-10-CM | POA: Diagnosis not present

## 2023-07-03 DIAGNOSIS — N186 End stage renal disease: Secondary | ICD-10-CM | POA: Diagnosis not present

## 2023-07-05 DIAGNOSIS — N2581 Secondary hyperparathyroidism of renal origin: Secondary | ICD-10-CM | POA: Diagnosis not present

## 2023-07-05 DIAGNOSIS — Z992 Dependence on renal dialysis: Secondary | ICD-10-CM | POA: Diagnosis not present

## 2023-07-05 DIAGNOSIS — N186 End stage renal disease: Secondary | ICD-10-CM | POA: Diagnosis not present

## 2023-07-07 DIAGNOSIS — Z992 Dependence on renal dialysis: Secondary | ICD-10-CM | POA: Diagnosis not present

## 2023-07-07 DIAGNOSIS — N2581 Secondary hyperparathyroidism of renal origin: Secondary | ICD-10-CM | POA: Diagnosis not present

## 2023-07-07 DIAGNOSIS — N186 End stage renal disease: Secondary | ICD-10-CM | POA: Diagnosis not present

## 2023-07-10 DIAGNOSIS — N186 End stage renal disease: Secondary | ICD-10-CM | POA: Diagnosis not present

## 2023-07-10 DIAGNOSIS — Z992 Dependence on renal dialysis: Secondary | ICD-10-CM | POA: Diagnosis not present

## 2023-07-10 DIAGNOSIS — N2581 Secondary hyperparathyroidism of renal origin: Secondary | ICD-10-CM | POA: Diagnosis not present

## 2023-07-11 DIAGNOSIS — I129 Hypertensive chronic kidney disease with stage 1 through stage 4 chronic kidney disease, or unspecified chronic kidney disease: Secondary | ICD-10-CM | POA: Diagnosis not present

## 2023-07-11 DIAGNOSIS — N186 End stage renal disease: Secondary | ICD-10-CM | POA: Diagnosis not present

## 2023-07-11 DIAGNOSIS — Z992 Dependence on renal dialysis: Secondary | ICD-10-CM | POA: Diagnosis not present

## 2023-07-13 DIAGNOSIS — Z992 Dependence on renal dialysis: Secondary | ICD-10-CM | POA: Diagnosis not present

## 2023-07-13 DIAGNOSIS — N186 End stage renal disease: Secondary | ICD-10-CM | POA: Diagnosis not present

## 2023-07-13 DIAGNOSIS — N2581 Secondary hyperparathyroidism of renal origin: Secondary | ICD-10-CM | POA: Diagnosis not present

## 2023-07-15 ENCOUNTER — Telehealth: Payer: Self-pay

## 2023-07-15 DIAGNOSIS — N2581 Secondary hyperparathyroidism of renal origin: Secondary | ICD-10-CM | POA: Diagnosis not present

## 2023-07-15 DIAGNOSIS — Z992 Dependence on renal dialysis: Secondary | ICD-10-CM | POA: Diagnosis not present

## 2023-07-15 DIAGNOSIS — N186 End stage renal disease: Secondary | ICD-10-CM | POA: Diagnosis not present

## 2023-07-15 NOTE — Telephone Encounter (Signed)
Copied from CRM [id number]. Topic: Appointments - Appointment Info/Confirmation >> Jul 15, 2023  2:12 PM Shon Hale wrote: Reason for CRM: Daughter calling to confirm physical appointment for Jan. 2025. Daughter wants to confirm two tests will be done for the kidney health panel, UACR(urine test) EGFR (blood test). Daughter states insurance requires pt to have these tests done. Pt is on dialysis.

## 2023-07-16 ENCOUNTER — Encounter (HOSPITAL_COMMUNITY): Payer: Self-pay

## 2023-07-16 ENCOUNTER — Other Ambulatory Visit: Payer: Self-pay | Admitting: Physician Assistant

## 2023-07-16 ENCOUNTER — Other Ambulatory Visit (HOSPITAL_COMMUNITY): Payer: Self-pay

## 2023-07-16 ENCOUNTER — Other Ambulatory Visit: Payer: Self-pay

## 2023-07-16 DIAGNOSIS — I5032 Chronic diastolic (congestive) heart failure: Secondary | ICD-10-CM

## 2023-07-16 DIAGNOSIS — E1149 Type 2 diabetes mellitus with other diabetic neurological complication: Secondary | ICD-10-CM

## 2023-07-16 DIAGNOSIS — E1165 Type 2 diabetes mellitus with hyperglycemia: Secondary | ICD-10-CM

## 2023-07-16 DIAGNOSIS — E1122 Type 2 diabetes mellitus with diabetic chronic kidney disease: Secondary | ICD-10-CM

## 2023-07-16 MED ORDER — GLIPIZIDE ER 2.5 MG PO TB24
2.5000 mg | ORAL_TABLET | Freq: Every day | ORAL | 0 refills | Status: DC
  Filled 2023-07-16 – 2023-09-02 (×3): qty 90, 90d supply, fill #0

## 2023-07-16 MED ORDER — ATORVASTATIN CALCIUM 80 MG PO TABS
80.0000 mg | ORAL_TABLET | Freq: Every day | ORAL | 0 refills | Status: DC
  Filled 2023-07-16 – 2023-09-02 (×2): qty 90, 90d supply, fill #0

## 2023-07-16 MED ORDER — ISOSORBIDE MONONITRATE ER 60 MG PO TB24
60.0000 mg | ORAL_TABLET | Freq: Every day | ORAL | 0 refills | Status: DC
  Filled 2023-07-16 – 2023-09-02 (×2): qty 90, 90d supply, fill #0

## 2023-07-16 NOTE — Telephone Encounter (Signed)
FYI

## 2023-07-16 NOTE — Telephone Encounter (Signed)
These tests are usually ordered at the dialysis center and she can obtain a copy from them.  I can also repeat them if she would like.

## 2023-07-17 ENCOUNTER — Other Ambulatory Visit (HOSPITAL_COMMUNITY): Payer: Self-pay

## 2023-07-17 DIAGNOSIS — Z992 Dependence on renal dialysis: Secondary | ICD-10-CM | POA: Diagnosis not present

## 2023-07-17 DIAGNOSIS — N2581 Secondary hyperparathyroidism of renal origin: Secondary | ICD-10-CM | POA: Diagnosis not present

## 2023-07-17 DIAGNOSIS — N186 End stage renal disease: Secondary | ICD-10-CM | POA: Diagnosis not present

## 2023-07-17 MED ORDER — MIDODRINE HCL 10 MG PO TABS
10.0000 mg | ORAL_TABLET | ORAL | 3 refills | Status: DC
  Filled 2023-07-17: qty 15, 35d supply, fill #0
  Filled 2023-09-02: qty 15, 35d supply, fill #1
  Filled 2023-11-20: qty 15, 35d supply, fill #2
  Filled 2024-01-28: qty 15, 35d supply, fill #3

## 2023-07-17 NOTE — Telephone Encounter (Signed)
Noted  

## 2023-07-18 ENCOUNTER — Other Ambulatory Visit (HOSPITAL_COMMUNITY): Payer: Self-pay

## 2023-07-18 ENCOUNTER — Other Ambulatory Visit (HOSPITAL_BASED_OUTPATIENT_CLINIC_OR_DEPARTMENT_OTHER): Payer: Self-pay

## 2023-07-18 MED ORDER — CALCIUM ACETATE (PHOS BINDER) 667 MG PO CAPS
667.0000 mg | ORAL_CAPSULE | Freq: Three times a day (TID) | ORAL | 2 refills | Status: AC
Start: 2023-07-17 — End: ?
  Filled 2023-07-18 – 2023-10-09 (×2): qty 270, 90d supply, fill #0
  Filled 2024-01-28 – 2024-06-09 (×8): qty 270, 90d supply, fill #1

## 2023-07-20 DIAGNOSIS — N186 End stage renal disease: Secondary | ICD-10-CM | POA: Diagnosis not present

## 2023-07-20 DIAGNOSIS — N2581 Secondary hyperparathyroidism of renal origin: Secondary | ICD-10-CM | POA: Diagnosis not present

## 2023-07-20 DIAGNOSIS — Z992 Dependence on renal dialysis: Secondary | ICD-10-CM | POA: Diagnosis not present

## 2023-07-22 DIAGNOSIS — N2581 Secondary hyperparathyroidism of renal origin: Secondary | ICD-10-CM | POA: Diagnosis not present

## 2023-07-22 DIAGNOSIS — N186 End stage renal disease: Secondary | ICD-10-CM | POA: Diagnosis not present

## 2023-07-22 DIAGNOSIS — Z992 Dependence on renal dialysis: Secondary | ICD-10-CM | POA: Diagnosis not present

## 2023-07-22 NOTE — Telephone Encounter (Signed)
Patient has been Scheduled at Endocrinology with provider Thapa Iraq on 01.14.2025 at 3pm.

## 2023-07-24 DIAGNOSIS — N2581 Secondary hyperparathyroidism of renal origin: Secondary | ICD-10-CM | POA: Diagnosis not present

## 2023-07-24 DIAGNOSIS — N186 End stage renal disease: Secondary | ICD-10-CM | POA: Diagnosis not present

## 2023-07-24 DIAGNOSIS — Z992 Dependence on renal dialysis: Secondary | ICD-10-CM | POA: Diagnosis not present

## 2023-07-25 IMAGING — US US RENAL
1 series · 14 of 25 positions shown · non-contrast
Comparison: Renal ultrasound 02/21/2021

CLINICAL DATA: Acute renal failure

EXAM:
RENAL / URINARY TRACT ULTRASOUND COMPLETE

[Series 1: us renal · 14 of 61 slices shown]
[im 1/61]
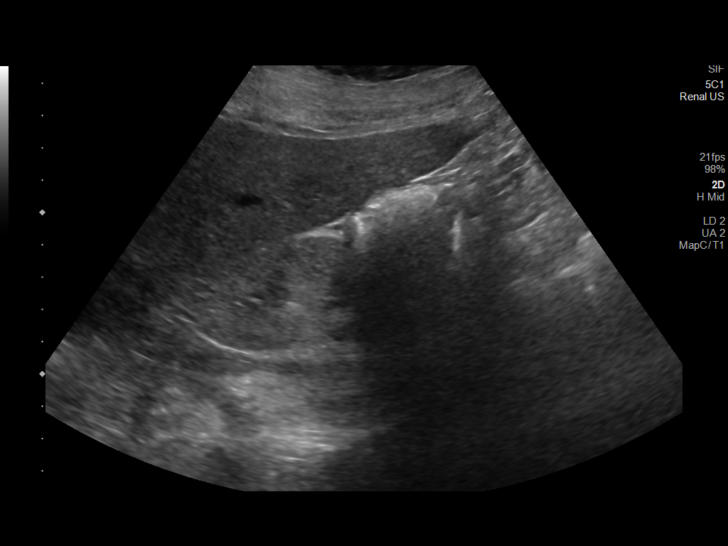
[im 6/61]
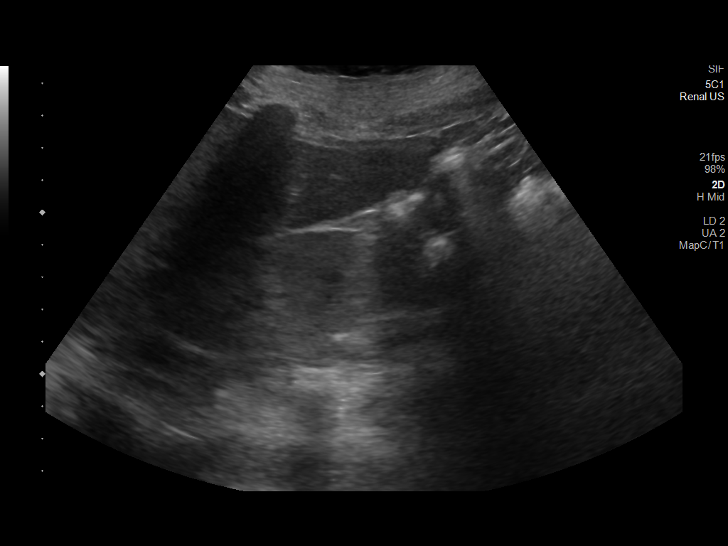
[im 11/61]
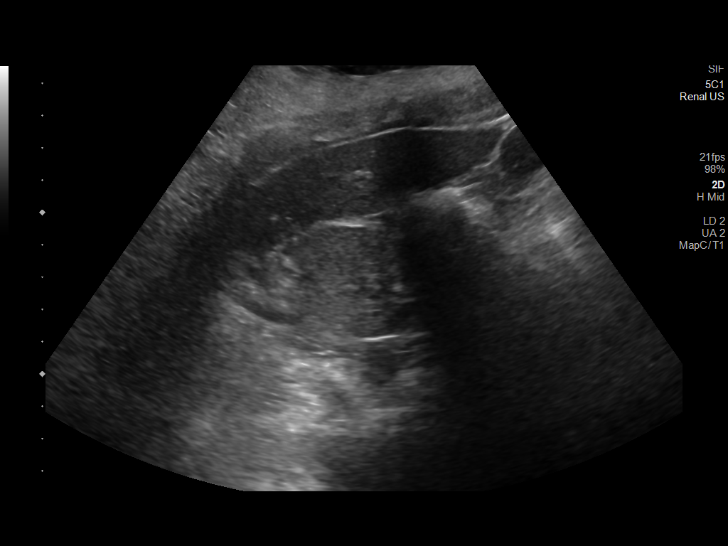
[im 16/61]
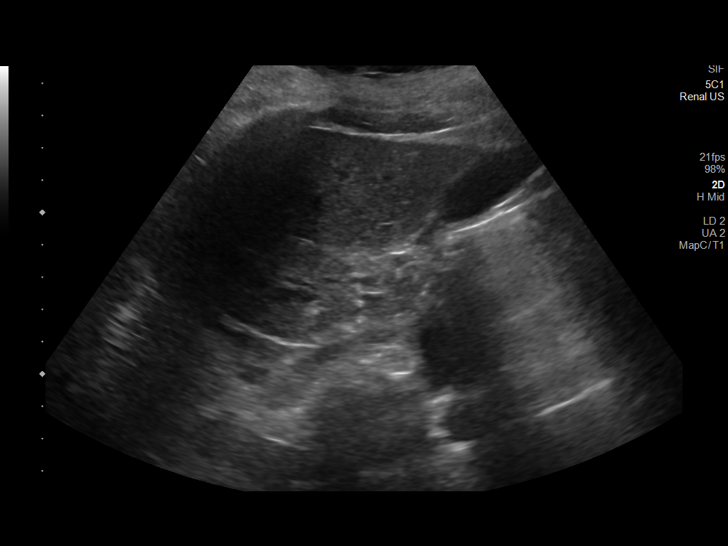
[im 21/61]
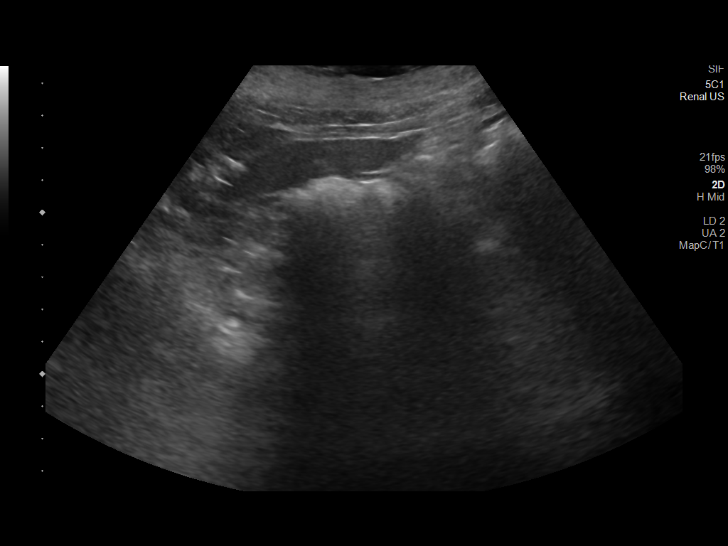
[im 23/61]
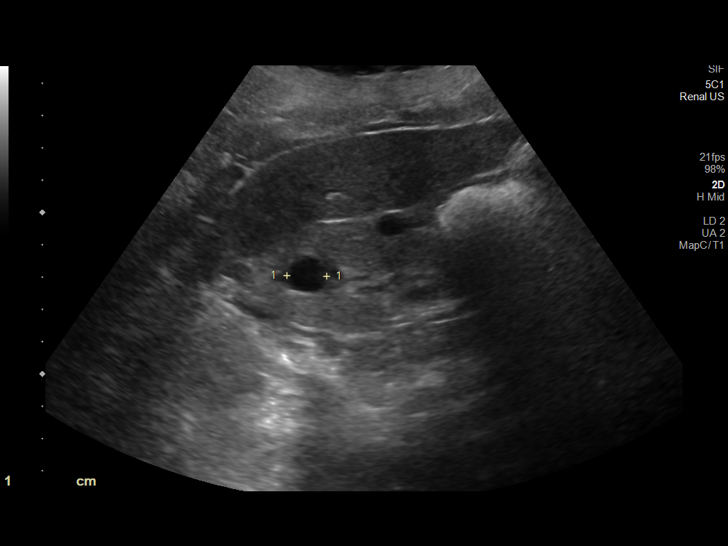
[im 28/61]
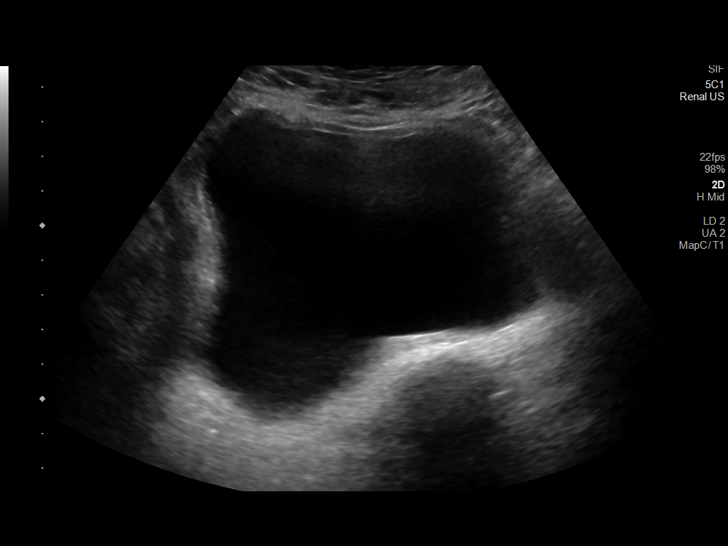
[im 33/61]
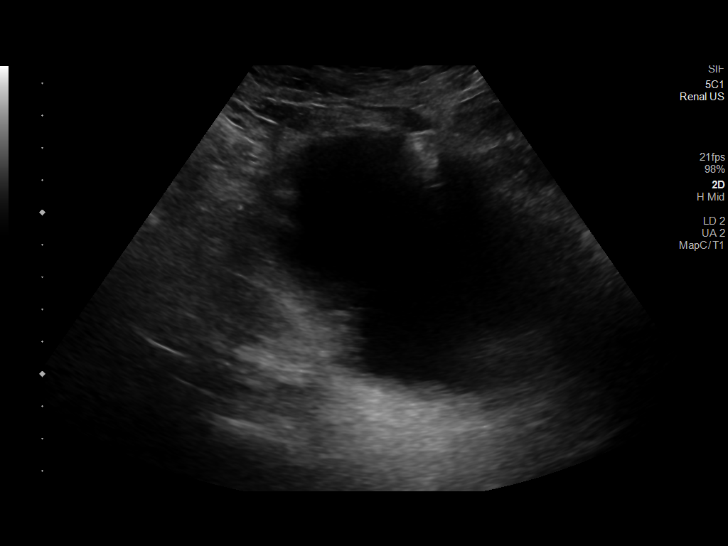
[im 38/61]
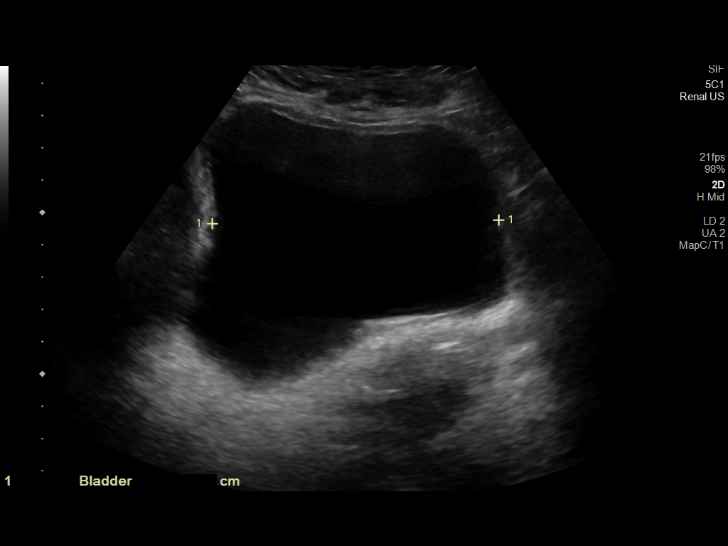
[im 41/61]
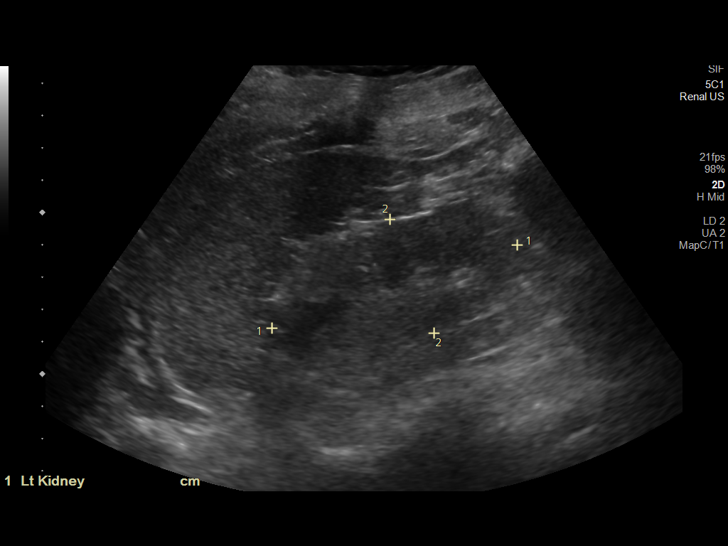
[im 46/61]
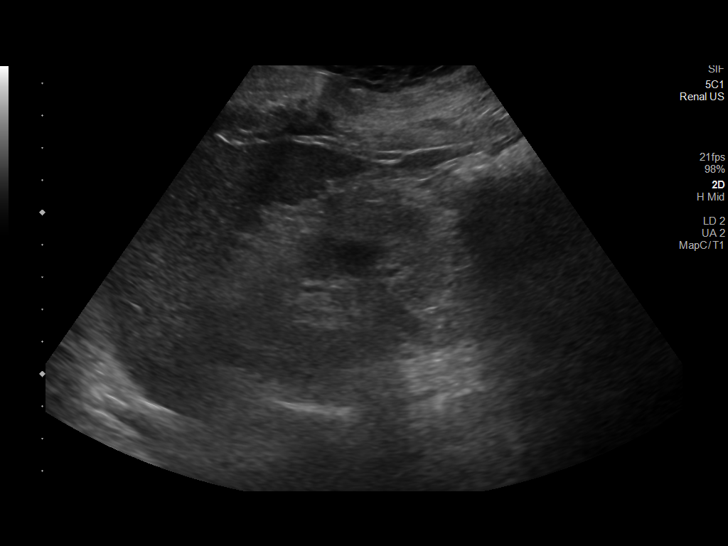
[im 51/61]
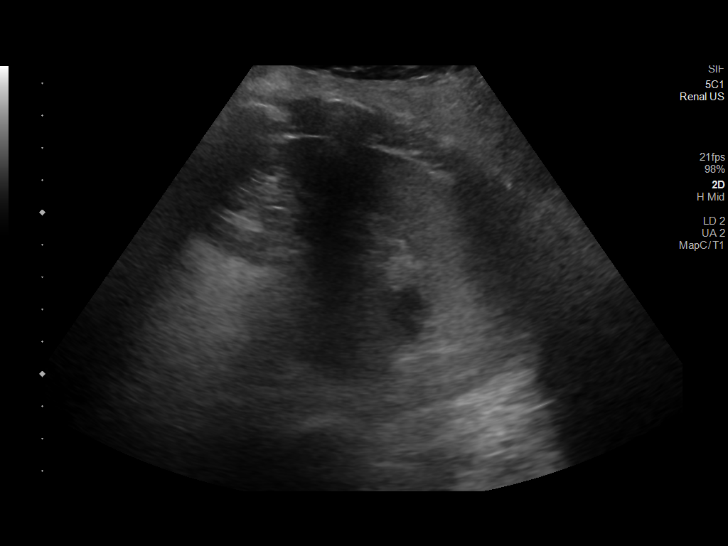
[im 56/61]
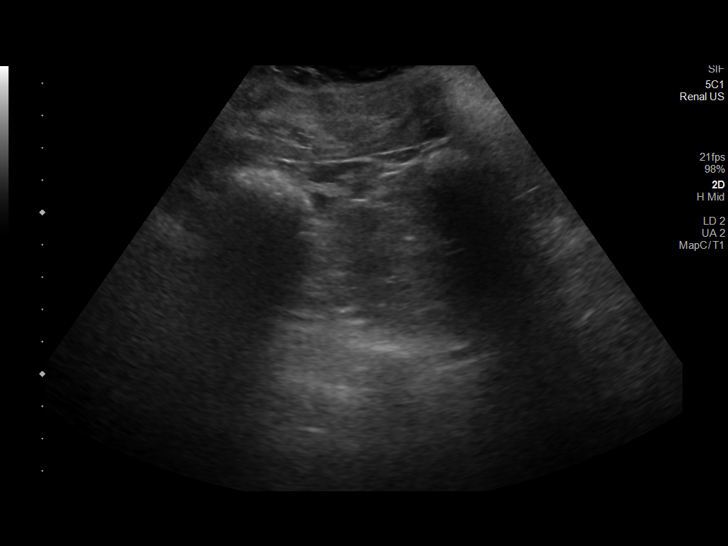
[im 61/61]
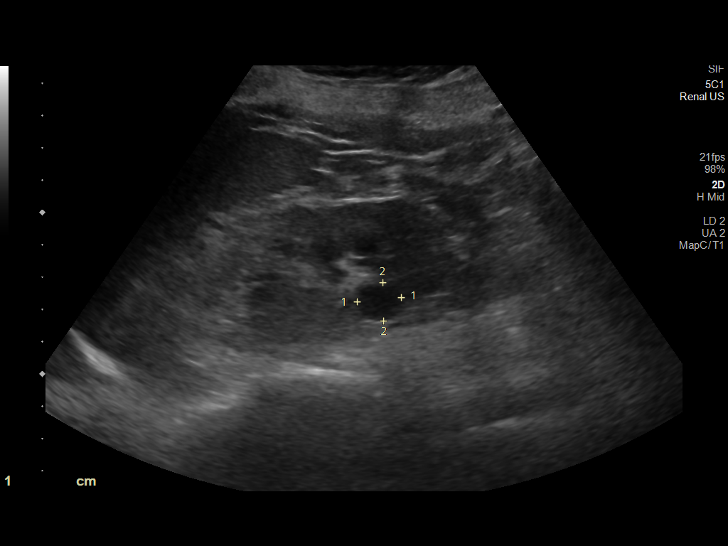

[14 of 25 positions shown; findings below may reference images not displayed]

FINDINGS: Right Kidney:

Renal measurements: 7.9 x 3.5 x 4.2 cm = volume: 61.4 mL. Normal
renal cortical thickness. Increased renal cortical echogenicity. No
hydronephrosis. There is a 1.3 cm cyst within the superior pole.

Left Kidney:

Renal measurements: 8.0 x 3.8 x 3.9 cm = volume: 62.1 mL. Normal
renal cortical thickness. Increased renal cortical echogenicity. No
hydronephrosis. There is a 1.4 cm cyst within the left kidney.

Bladder:

Appears normal for degree of bladder distention.

Other:

None.
IMPRESSION: Increased renal cortical echogenicity as can be seen with chronic
medical renal disease.

No hydronephrosis.

## 2023-07-27 DIAGNOSIS — N2581 Secondary hyperparathyroidism of renal origin: Secondary | ICD-10-CM | POA: Diagnosis not present

## 2023-07-27 DIAGNOSIS — N186 End stage renal disease: Secondary | ICD-10-CM | POA: Diagnosis not present

## 2023-07-27 DIAGNOSIS — Z992 Dependence on renal dialysis: Secondary | ICD-10-CM | POA: Diagnosis not present

## 2023-07-29 ENCOUNTER — Other Ambulatory Visit: Payer: Self-pay

## 2023-07-29 ENCOUNTER — Emergency Department (HOSPITAL_COMMUNITY): Payer: Medicare HMO

## 2023-07-29 ENCOUNTER — Emergency Department (HOSPITAL_COMMUNITY)
Admission: EM | Admit: 2023-07-29 | Discharge: 2023-07-31 | Disposition: A | Discharge status: Home / Self Care | Payer: Medicare HMO | Attending: Emergency Medicine | Admitting: Emergency Medicine

## 2023-07-29 DIAGNOSIS — E1140 Type 2 diabetes mellitus with diabetic neuropathy, unspecified: Secondary | ICD-10-CM | POA: Diagnosis not present

## 2023-07-29 DIAGNOSIS — K219 Gastro-esophageal reflux disease without esophagitis: Secondary | ICD-10-CM | POA: Insufficient documentation

## 2023-07-29 DIAGNOSIS — Z8673 Personal history of transient ischemic attack (TIA), and cerebral infarction without residual deficits: Secondary | ICD-10-CM | POA: Diagnosis not present

## 2023-07-29 DIAGNOSIS — E1151 Type 2 diabetes mellitus with diabetic peripheral angiopathy without gangrene: Secondary | ICD-10-CM | POA: Diagnosis not present

## 2023-07-29 DIAGNOSIS — Z7901 Long term (current) use of anticoagulants: Secondary | ICD-10-CM | POA: Insufficient documentation

## 2023-07-29 DIAGNOSIS — Z91010 Allergy to peanuts: Secondary | ICD-10-CM | POA: Insufficient documentation

## 2023-07-29 DIAGNOSIS — I4891 Unspecified atrial fibrillation: Secondary | ICD-10-CM | POA: Diagnosis not present

## 2023-07-29 DIAGNOSIS — E785 Hyperlipidemia, unspecified: Secondary | ICD-10-CM | POA: Diagnosis not present

## 2023-07-29 DIAGNOSIS — R55 Syncope and collapse: Secondary | ICD-10-CM | POA: Diagnosis not present

## 2023-07-29 DIAGNOSIS — I5032 Chronic diastolic (congestive) heart failure: Secondary | ICD-10-CM | POA: Diagnosis not present

## 2023-07-29 DIAGNOSIS — I132 Hypertensive heart and chronic kidney disease with heart failure and with stage 5 chronic kidney disease, or end stage renal disease: Secondary | ICD-10-CM | POA: Diagnosis not present

## 2023-07-29 DIAGNOSIS — I10 Essential (primary) hypertension: Secondary | ICD-10-CM | POA: Diagnosis not present

## 2023-07-29 DIAGNOSIS — Z20822 Contact with and (suspected) exposure to covid-19: Secondary | ICD-10-CM | POA: Insufficient documentation

## 2023-07-29 DIAGNOSIS — R9082 White matter disease, unspecified: Secondary | ICD-10-CM | POA: Diagnosis not present

## 2023-07-29 DIAGNOSIS — N2581 Secondary hyperparathyroidism of renal origin: Secondary | ICD-10-CM | POA: Diagnosis not present

## 2023-07-29 DIAGNOSIS — R202 Paresthesia of skin: Secondary | ICD-10-CM | POA: Diagnosis not present

## 2023-07-29 DIAGNOSIS — E1122 Type 2 diabetes mellitus with diabetic chronic kidney disease: Secondary | ICD-10-CM | POA: Insufficient documentation

## 2023-07-29 DIAGNOSIS — N186 End stage renal disease: Secondary | ICD-10-CM | POA: Insufficient documentation

## 2023-07-29 DIAGNOSIS — Z992 Dependence on renal dialysis: Secondary | ICD-10-CM | POA: Insufficient documentation

## 2023-07-29 DIAGNOSIS — Z79899 Other long term (current) drug therapy: Secondary | ICD-10-CM | POA: Insufficient documentation

## 2023-07-29 DIAGNOSIS — Z7951 Long term (current) use of inhaled steroids: Secondary | ICD-10-CM | POA: Insufficient documentation

## 2023-07-29 DIAGNOSIS — J45909 Unspecified asthma, uncomplicated: Secondary | ICD-10-CM | POA: Diagnosis not present

## 2023-07-29 DIAGNOSIS — Z7984 Long term (current) use of oral hypoglycemic drugs: Secondary | ICD-10-CM | POA: Insufficient documentation

## 2023-07-29 DIAGNOSIS — I959 Hypotension, unspecified: Secondary | ICD-10-CM | POA: Diagnosis not present

## 2023-07-29 DIAGNOSIS — R0689 Other abnormalities of breathing: Secondary | ICD-10-CM | POA: Diagnosis not present

## 2023-07-29 DIAGNOSIS — R531 Weakness: Secondary | ICD-10-CM | POA: Diagnosis not present

## 2023-07-29 DIAGNOSIS — H538 Other visual disturbances: Secondary | ICD-10-CM | POA: Diagnosis not present

## 2023-07-29 DIAGNOSIS — R29818 Other symptoms and signs involving the nervous system: Secondary | ICD-10-CM | POA: Diagnosis not present

## 2023-07-29 LAB — I-STAT CHEM 8, ED
BUN: 17 mg/dL (ref 8–23)
Calcium, Ion: 0.86 mmol/L — CL (ref 1.15–1.40)
Chloride: 93 mmol/L — ABNORMAL LOW (ref 98–111)
Creatinine, Ser: 3.3 mg/dL — ABNORMAL HIGH (ref 0.44–1.00)
Glucose, Bld: 175 mg/dL — ABNORMAL HIGH (ref 70–99)
HCT: 32 % — ABNORMAL LOW (ref 36.0–46.0)
Hemoglobin: 10.9 g/dL — ABNORMAL LOW (ref 12.0–15.0)
Potassium: 3.3 mmol/L — ABNORMAL LOW (ref 3.5–5.1)
Sodium: 136 mmol/L (ref 135–145)
TCO2: 30 mmol/L (ref 22–32)

## 2023-07-29 LAB — CBC
HCT: 30.4 % — ABNORMAL LOW (ref 36.0–46.0)
Hemoglobin: 10.5 g/dL — ABNORMAL LOW (ref 12.0–15.0)
MCH: 29.8 pg (ref 26.0–34.0)
MCHC: 34.5 g/dL (ref 30.0–36.0)
MCV: 86.4 fL (ref 80.0–100.0)
Platelets: 159 10*3/uL (ref 150–400)
RBC: 3.52 MIL/uL — ABNORMAL LOW (ref 3.87–5.11)
RDW: 15.5 % (ref 11.5–15.5)
WBC: 9 10*3/uL (ref 4.0–10.5)
nRBC: 0 % (ref 0.0–0.2)

## 2023-07-29 LAB — DIFFERENTIAL
Abs Immature Granulocytes: 0.03 10*3/uL (ref 0.00–0.07)
Basophils Absolute: 0 10*3/uL (ref 0.0–0.1)
Basophils Relative: 0 %
Eosinophils Absolute: 0.2 10*3/uL (ref 0.0–0.5)
Eosinophils Relative: 2 %
Immature Granulocytes: 0 %
Lymphocytes Relative: 12 %
Lymphs Abs: 1.1 10*3/uL (ref 0.7–4.0)
Monocytes Absolute: 0.8 10*3/uL (ref 0.1–1.0)
Monocytes Relative: 8 %
Neutro Abs: 6.9 10*3/uL (ref 1.7–7.7)
Neutrophils Relative %: 78 %

## 2023-07-29 LAB — COMPREHENSIVE METABOLIC PANEL
ALT: 19 U/L (ref 0–44)
AST: 19 U/L (ref 15–41)
Albumin: 3.7 g/dL (ref 3.5–5.0)
Alkaline Phosphatase: 97 U/L (ref 38–126)
Anion gap: 14 (ref 5–15)
BUN: 16 mg/dL (ref 8–23)
CO2: 29 mmol/L (ref 22–32)
Calcium: 8.5 mg/dL — ABNORMAL LOW (ref 8.9–10.3)
Chloride: 93 mmol/L — ABNORMAL LOW (ref 98–111)
Creatinine, Ser: 3.16 mg/dL — ABNORMAL HIGH (ref 0.44–1.00)
GFR, Estimated: 15 mL/min — ABNORMAL LOW (ref >60)
Glucose, Bld: 179 mg/dL — ABNORMAL HIGH (ref 70–99)
Potassium: 3.3 mmol/L — ABNORMAL LOW (ref 3.5–5.1)
Sodium: 136 mmol/L (ref 135–145)
Total Bilirubin: 1 mg/dL (ref <1.2)
Total Protein: 7.4 g/dL (ref 6.5–8.1)

## 2023-07-29 LAB — CBG MONITORING, ED: Glucose-Capillary: 184 mg/dL — ABNORMAL HIGH (ref 70–99)

## 2023-07-29 LAB — RESP PANEL BY RT-PCR (RSV, FLU A&B, COVID)  RVPGX2
Influenza A by PCR: NEGATIVE
Influenza B by PCR: NEGATIVE
Resp Syncytial Virus by PCR: NEGATIVE
SARS Coronavirus 2 by RT PCR: NEGATIVE

## 2023-07-29 LAB — ETHANOL: Alcohol, Ethyl (B): <10 mg/dL (ref <10)

## 2023-07-29 LAB — PROTIME-INR
INR: 1.3 — ABNORMAL HIGH (ref 0.8–1.2)
Prothrombin Time: 16.3 s — ABNORMAL HIGH (ref 11.4–15.2)

## 2023-07-29 LAB — APTT: aPTT: 26 s (ref 24–36)

## 2023-07-29 MED ORDER — ALBUTEROL SULFATE HFA 108 (90 BASE) MCG/ACT IN AERS: 1.0000 | INHALATION_SPRAY | Freq: Four times a day (QID) | RESPIRATORY_TRACT | Status: DC | PRN

## 2023-07-29 MED ORDER — GABAPENTIN 100 MG PO CAPS
100.0000 mg | ORAL_CAPSULE | Freq: Three times a day (TID) | ORAL | Status: DC
  Administered 2023-07-29 – 2023-07-31 (×5): 100 mg via ORAL
  Filled 2023-07-29 (×5): qty 1

## 2023-07-29 MED ORDER — AMLODIPINE BESYLATE 5 MG PO TABS
5.0000 mg | ORAL_TABLET | Freq: Every day | ORAL | Status: DC
  Administered 2023-07-29: 5 mg via ORAL
  Filled 2023-07-29: qty 1

## 2023-07-29 MED ORDER — CARVEDILOL 12.5 MG PO TABS: 12.5000 mg | ORAL_TABLET | Freq: Two times a day (BID) | ORAL | Status: DC

## 2023-07-29 MED ORDER — GLIPIZIDE ER 2.5 MG PO TB24
2.5000 mg | ORAL_TABLET | Freq: Every day | ORAL | Status: DC
  Administered 2023-07-31: 2.5 mg via ORAL
  Filled 2023-07-29 (×4): qty 1

## 2023-07-29 MED ORDER — ACETAMINOPHEN 500 MG PO TABS: 500.0000 mg | ORAL_TABLET | Freq: Four times a day (QID) | ORAL | Status: DC | PRN

## 2023-07-29 MED ORDER — AZELASTINE HCL 0.1 % NA SOLN
2.0000 | Freq: Two times a day (BID) | NASAL | Status: DC
  Administered 2023-07-30 (×2): 2 via NASAL
  Filled 2023-07-29: qty 30

## 2023-07-29 MED ORDER — AMLODIPINE BESYLATE 5 MG PO TABS
5.0000 mg | ORAL_TABLET | Freq: Every day | ORAL | Status: DC
  Administered 2023-07-30: 5 mg via ORAL
  Filled 2023-07-29: qty 1

## 2023-07-29 MED ORDER — ATORVASTATIN CALCIUM 80 MG PO TABS
80.0000 mg | ORAL_TABLET | Freq: Every day | ORAL | Status: DC
  Administered 2023-07-30: 80 mg via ORAL
  Filled 2023-07-29: qty 1

## 2023-07-29 MED ORDER — MIRTAZAPINE 15 MG PO TABS: 7.5000 mg | ORAL_TABLET | Freq: Every day | ORAL | Status: DC

## 2023-07-29 MED ORDER — LORAZEPAM 2 MG/ML IJ SOLN
1.0000 mg | Freq: Once | INTRAMUSCULAR | Status: AC
  Administered 2023-07-29: 1 mg via INTRAVENOUS
  Filled 2023-07-29: qty 1

## 2023-07-29 MED ORDER — ONDANSETRON HCL 4 MG PO TABS: 4.0000 mg | ORAL_TABLET | Freq: Three times a day (TID) | ORAL | Status: DC | PRN

## 2023-07-29 MED ORDER — SODIUM CHLORIDE 0.9% FLUSH
3.0000 mL | Freq: Once | INTRAVENOUS | Status: AC
  Administered 2023-07-29: 3 mL via INTRAVENOUS

## 2023-07-29 MED ORDER — ALBUTEROL SULFATE (2.5 MG/3ML) 0.083% IN NEBU: 2.5000 mg | INHALATION_SOLUTION | Freq: Four times a day (QID) | RESPIRATORY_TRACT | Status: DC | PRN

## 2023-07-29 MED ORDER — TIZANIDINE HCL 4 MG PO TABS: 4.0000 mg | ORAL_TABLET | ORAL | Status: DC

## 2023-07-29 MED ORDER — CALCIUM ACETATE (PHOS BINDER) 667 MG PO CAPS
667.0000 mg | ORAL_CAPSULE | Freq: Three times a day (TID) | ORAL | Status: DC
  Administered 2023-07-30 – 2023-07-31 (×4): 667 mg via ORAL
  Filled 2023-07-29 (×4): qty 1

## 2023-07-29 MED ORDER — ATORVASTATIN CALCIUM 40 MG PO TABS
80.0000 mg | ORAL_TABLET | Freq: Every day | ORAL | Status: DC
  Administered 2023-07-29: 80 mg via ORAL
  Filled 2023-07-29: qty 2

## 2023-07-29 MED ORDER — CARVEDILOL 12.5 MG PO TABS
12.5000 mg | ORAL_TABLET | Freq: Two times a day (BID) | ORAL | Status: DC
  Administered 2023-07-29 – 2023-07-31 (×4): 12.5 mg via ORAL
  Filled 2023-07-29 (×4): qty 1

## 2023-07-29 MED ORDER — ISOSORBIDE MONONITRATE ER 30 MG PO TB24
60.0000 mg | ORAL_TABLET | Freq: Every day | ORAL | Status: DC
  Administered 2023-07-29 – 2023-07-31 (×3): 60 mg via ORAL
  Filled 2023-07-29 (×3): qty 2

## 2023-07-29 MED ORDER — ARTIFICIAL TEARS OPHTHALMIC OINT
TOPICAL_OINTMENT | Freq: Every day | OPHTHALMIC | Status: DC
  Filled 2023-07-29: qty 3.5

## 2023-07-29 NOTE — ED Provider Notes (Signed)
[facility]MENT AT [facility] Provider Note  CSN: [id number] Arrival date & time: 07/29/23 1646  Chief Complaint(s) Code Stroke  HPI Paula [name] is a 77 y.o. female here today from dialysis after she began to feel unwell and complained of bilateral blurriness.  She was activated as a stroke by the field, was evaluated by neurology at the door.  Patient had CT imaging, was deemed less likely to be CVA.  Patient tells me that she has had a cough for some time, she has felt unwell, and she has had pain in her upper back.  She uses a walker to get around at home.  She tells me that she would like to see a Child psychotherapist because she does not feel she can take care of herself at home.  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   ESRD on dialysis (HCC) 11/20/2022   Mold exposure 08/14/2022   Opioid withdrawal (HCC) 08/14/2022   ILD (interstitial lung disease) (HCC) 08/14/2022   Secondary hyperparathyroidism, renal (HCC) 08/14/2022   Paroxysmal A-fib (HCC) 05/20/2022   Hypertrophy of nasal turbinates 09/05/2021   Chronic kidney disease, stage 5 (HCC)    Aortic atherosclerosis (HCC) 06/24/2021   Chronic rhinitis 10/01/2017   Seasonal allergic conjunctivitis 10/01/2017   Aneurysm (HCC) 09/24/2017   Acute metabolic encephalopathy 09/02/2017   Allergic rhinitis 08/20/2017   Palpitations 07/10/2016   Gout 01/23/2016   History of  cataract 02/27/2015   Seasonal and perennial allergic rhinitis 02/15/2012   Chronic diastolic CHF (congestive heart failure) (HCC) 10/11/2011   GERD (gastroesophageal reflux disease)    Type 2 diabetes mellitus without complication, without long-term current use of insulin (HCC) 07/03/2010   Hyperlipidemia 07/03/2010   Essential hypertension 07/03/2010   Home Medication(s) Prior to Admission medications   Medication Sig Start Date End Date Taking? Authorizing Provider  acetaminophen (TYLENOL) 500 MG tablet Take 500 mg by mouth every 6 (six) hours as needed for fever, moderate pain or mild pain.    [provider]  albuterol (PROAIR HFA) 108 (90 Base) MCG/ACT inhaler Inhale 1-2 puffs into the lungs every 6 (six) hours as needed for wheezing or shortness of breath. 04/16/23   Anders Simmonds, PA-C  albuterol (PROVENTIL) (2.5 MG/3ML) 0.083% nebulizer solution Inhale 3 mLs (1 vial) by nebulization every 6 hours as needed for wheezing or shortness of breath. 03/04/23   Hoy Register, MD  amLODipine (NORVASC) 5 MG tablet Take 1 tablet (5 mg total) by mouth daily. 04/16/23   Anders Simmonds, PA-C  apixaban (ELIQUIS) 5 MG TABS tablet Take 1 tablet (5 mg total) by mouth 2 (two) times daily. 04/02/23   Hoy Register, MD  atorvastatin (LIPITOR) 80 MG tablet Take 1 tablet (80 mg total) by mouth daily. 07/16/23   Hoy Register, MD  azelastine (ASTELIN) 0.1 % nasal spray Place  2 sprays into both nostrils 2 (two) times daily. Use in each nostril as directed 01/08/23   Hoy Register, MD  calcium acetate (PHOSLO) 667 MG capsule Take 1 capsule by mouth 3 times daily 10/31/21   Penninger, Lillia Abed, PA  calcium acetate (PHOSLO) 667 MG capsule Take 1 capsule (667 mg total) by mouth 3 (three) times daily with meals. 01/14/23     calcium acetate (PHOSLO) 667 MG capsule Take 1 capsule (667 mg total) by mouth 3 (three) times daily with meals. 07/17/23     carvedilol (COREG) 12.5 MG tablet Take 1 tablet (12.5 mg  total) by mouth 2 (two) times daily with a meal. (Take once a day in the evening on hemodialysis days) 04/16/23   Anders Simmonds, PA-C  ethyl chloride spray Apply a small amount on skin immediately before cannulation 01/09/22   Dagoberto Ligas, MD  fluticasone (FLONASE) 50 MCG/ACT nasal spray Place 2 sprays into both nostrils daily. 01/08/23   Hoy Register, MD  gabapentin (NEURONTIN) 100 MG capsule Take 1 capsule (100 mg total) by mouth 3 (three) times daily. 11/25/22   Hoy Register, MD  glipiZIDE (GLUCOTROL XL) 2.5 MG 24 hr tablet Take 1 tablet (2.5 mg total) by mouth daily with breakfast. (needs office visit) 07/16/23   Hoy Register, MD  glucose blood (ACCU-CHEK GUIDE) test strip USE AS INSTRUCTED 2 TIMES DAILY 04/16/23   Anders Simmonds, PA-C  Homeopathic Products (EARACHE DROPS OT) Place 1 drop into both ears daily.    [provider]  isosorbide mononitrate (IMDUR) 60 MG 24 hr tablet Take 1 tablet (60 mg total) by mouth daily. 07/16/23   Hoy Register, MD  lidocaine-prilocaine (EMLA) cream Apply a small amount to dialysis access one hour prior to dialysis. Wrap with plastic wrap. 12/10/21     loratadine (CLARITIN) 10 MG tablet Take 1 tablet (10 mg total) by mouth daily. 05/08/23   Hoy Register, MD  midodrine (PROAMATINE) 10 MG tablet Take 1 tablet (10 mg total) by mouth 3 (three) times a week. Take 30 minutes prior to HD 07/17/23     mirtazapine (REMERON) 7.5 MG tablet Take 1 tablet (7.5 mg total) by mouth at bedtime. Stop trazodone. 05/19/23   Mayers, Cari S, PA-C  Misc. Devices [employer]  Diagnosis - unstable gait 03/05/21   Hoy Register, MD  Misc. Devices MISC Fistula sleeve for R arm.  Diagnoses: End-stage renal disease on hemodialysis 01/09/22   Hoy Register, MD  Misc. Devices MISC 1. Incontinence supplies.  2. Briefs.  Diagnosis-urinary incontinence 04/17/22   Hoy Register, MD  Misc. Devices MISC Nepro dialysis protein drink.  Diagnosis end-stage renal disease 05/16/22    Hoy Register, MD  Misc. Devices MISC Blood pressure monitor.  Diagnosis hypertension 05/20/22   Hoy Register, MD  olopatadine (PATANOL) 0.1 % ophthalmic solution Place 1 drop into both eyes 2 (two) times daily. 08/14/22   Storm Frisk, MD  ondansetron (ZOFRAN) 4 MG tablet Take 1 tablet (4 mg total) by mouth every 8 (eight) hours as needed for nausea or vomiting. 04/16/23   Anders Simmonds, PA-C  Polyethyl Glycol-Propyl Glycol (SYSTANE OP) Place 1 drop into both eyes daily.    [provider]  tiZANidine (ZANAFLEX) 4 MG tablet Take 1 tablet (4 mg total) by mouth every other day. As needed before hemodialysis sessions for muscle spasms 01/08/23   Hoy Register, MD  Past Surgical History Past Surgical History:  Procedure Laterality Date   AV FISTULA PLACEMENT Right 10/15/2021   Procedure: ARTERIOVENOUS (AV) FISTULA  WITH PLACEMENT OF GORE-TEX STRETCH  GRAFT (4-80mmx45cm);  Surgeon: Chuck Hint, MD;  Location: [facility] OR;  Service: Vascular;  Laterality: Right;   CATARACT EXTRACTION     right eye   IR 3D INDEPENDENT WKST  12/14/2017   IR ANGIO INTRA EXTRACRAN SEL COM CAROTID INNOMINATE BILAT MOD SED  09/15/2017   IR ANGIO INTRA EXTRACRAN SEL COM CAROTID INNOMINATE UNI L MOD SED  12/14/2017   IR ANGIO VERTEBRAL SEL VERTEBRAL BILAT MOD SED  09/15/2017   IR RADIOLOGIST EVAL & MGMT  09/10/2017   IR RADIOLOGIST EVAL & MGMT  10/19/2017   lymphatic mass surgery     NASAL TURBINATE REDUCTION     RADIOLOGY WITH ANESTHESIA N/A 12/14/2017   Procedure: RADIOLOGY WITH ANESTHESIA EMBOLIZATION;  Surgeon: Julieanne Cotton, MD;  Location: [facility] OR;  Service: Radiology;  Laterality: N/A;   VIDEO BRONCHOSCOPY Bilateral 11/18/2017   Procedure: VIDEO BRONCHOSCOPY WITHOUT FLUORO;  Surgeon: Leslye Peer, MD;  Location: [facility] ENDOSCOPY;  Service: Cardiopulmonary;   Laterality: Bilateral;   Family History Family History  Problem Relation Age of Onset   Hypertension Mother    Asthma Sister    Colon cancer Neg Hx    Allergic rhinitis Neg Hx    Eczema Neg Hx     Social History Social History   Tobacco Use   Smoking status: Never    Passive exposure: Never   Smokeless tobacco: Never  Vaping Use   Vaping status: Never Used  Substance Use Topics   Alcohol use: No   Drug use: No   Allergies Dust mite extract, Peanut-containing drug products, Ace inhibitors, and Lovastatin  Review of Systems Review of Systems  Physical Exam Vital Signs  I have reviewed the triage vital signs BP (!) 152/74   Pulse (!) 44   Temp 98.4 F (36.9 C) (Oral)   Resp (!) 22   Ht 5\' 3"  (1.6 m)   Wt 74.1 kg   LMP  (LMP Unknown)   SpO2 98%   BMI 28.94 kg/m   Physical Exam Vitals reviewed.  HENT:     Nose: Nose normal.  Eyes:     Conjunctiva/sclera: Conjunctivae normal.     Pupils: Pupils are equal, round, and reactive to light.  Cardiovascular:     Rate and Rhythm: Normal rate. Rhythm irregular.  Pulmonary:     Effort: Pulmonary effort is normal.  Abdominal:     General: Abdomen is flat.     Palpations: Abdomen is soft.  Musculoskeletal:        General: Normal range of motion.     Cervical back: Normal range of motion.  Skin:    General: Skin is warm and dry.  Neurological:     General: No focal deficit present.     Mental Status: She is alert.     ED Results and Treatments Labs (all labs ordered are listed, but only abnormal results are displayed) Labs Reviewed  PROTIME-INR - Abnormal; Notable for the following components:      Result Value   Prothrombin Time 16.3 (*)    INR 1.3 (*)    All other components within normal limits  CBC - Abnormal; Notable for the following components:   RBC 3.52 (*)    Hemoglobin 10.5 (*)    HCT 30.4 (*)    All other components within normal  limits  COMPREHENSIVE METABOLIC PANEL - Abnormal; Notable  for the following components:   Potassium 3.3 (*)    Chloride 93 (*)    Glucose, Bld 179 (*)    Creatinine, Ser 3.16 (*)    Calcium 8.5 (*)    GFR, Estimated 15 (*)    All other components within normal limits  I-STAT CHEM 8, ED - Abnormal; Notable for the following components:   Potassium 3.3 (*)    Chloride 93 (*)    Creatinine, Ser 3.30 (*)    Glucose, Bld 175 (*)    Calcium, Ion 0.86 (*)    Hemoglobin 10.9 (*)    HCT 32.0 (*)    All other components within normal limits  CBG MONITORING, ED - Abnormal; Notable for the following components:   Glucose-Capillary 184 (*)    All other components within normal limits  RESP PANEL BY RT-PCR (RSV, FLU A&B, COVID)  RVPGX2  APTT  DIFFERENTIAL  ETHANOL                                                                                                                          Radiology MR BRAIN WO CONTRAST Result Date: 07/29/2023 CLINICAL DATA:  Neuro deficit, acute, stroke suspected EXAM: MRI HEAD WITHOUT CONTRAST TECHNIQUE: Multiplanar, multiecho pulse sequences of the brain and surrounding structures were obtained without intravenous contrast. COMPARISON:  Same day CT head.  MRI head February 03, 2023. FINDINGS: Brain: Similar prior microhemorrhages in the thalami, larger on the right. Small remote lacunar infarct in the pons. No evidence of acute hemorrhage, acute infarct, mass lesion, midline shift or hydrocephalus. Patchy T2/FLAIR hyperintensities in the white matter are nonspecific but compatible with mild for age chronic microvascular ischemic disease. Vascular: Major arterial flow voids are maintained at the skull base. Skull and upper cervical spine: Normal marrow signal. Sinuses/Orbits: Sinuses.  No acute orbital findings. Other: No mastoid effusions. IMPRESSION: No evidence of acute intracranial abnormality. Electronically Signed   By: Feliberto Harts M.D.   On: 07/29/2023 18:40   CT HEAD CODE STROKE WO CONTRAST Result Date:  07/29/2023 CLINICAL DATA:  Code stroke.  Blurred vision EXAM: CT HEAD WITHOUT CONTRAST TECHNIQUE: Contiguous axial images were obtained from the base of the skull through the vertex without intravenous contrast. RADIATION DOSE REDUCTION: This exam was performed according to the departmental dose-optimization program which includes automated exposure control, adjustment of the mA and/or kV according to patient size and/or use of iterative reconstruction technique. COMPARISON:  02/02/2023 FINDINGS: Brain: No evidence of acute infarction, hemorrhage, mass, mass effect, or midline shift. No hydrocephalus or extra-axial collection. Vascular: No hyperdense vessel. Skull: Negative for fracture or focal lesion. Sinuses/Orbits: No acute finding. Status post bilateral lens replacements. Other: The mastoid air cells are well aerated. ASPECTS [facility] Stroke Program Early CT Score) - Ganglionic level infarction (caudate, lentiform nuclei, internal capsule, insula, M1-M3 cortex): 7 - Supraganglionic infarction (M4-M6 cortex): 3 Total score (0-10 with 10 being normal): 10  IMPRESSION: No acute intracranial process. ASPECTS is 10. Imaging results were communicated on 07/29/2023 at 5:03 pm to provider STACK via secure text paging. Electronically Signed   By: Wiliam Ke M.D.   On: 07/29/2023 17:03    Pertinent labs & imaging results that were available during my care of the patient were reviewed by me and considered in my medical decision making (see MDM for details).  Medications Ordered in ED Medications  acetaminophen (TYLENOL) tablet 500 mg (has no administration in time range)  albuterol (VENTOLIN HFA) 108 (90 Base) MCG/ACT inhaler 1-2 puff (has no administration in time range)  amLODipine (NORVASC) tablet 5 mg (has no administration in time range)  atorvastatin (LIPITOR) tablet 80 mg (has no administration in time range)  azelastine (ASTELIN) 0.1 % nasal spray 2 spray (has no administration in time range)   calcium acetate (PHOSLO) capsule 667 mg (has no administration in time range)  carvedilol (COREG) tablet 12.5 mg (has no administration in time range)  gabapentin (NEURONTIN) capsule 100 mg (has no administration in time range)  glipiZIDE (GLUCOTROL XL) 24 hr tablet 2.5 mg (has no administration in time range)  isosorbide mononitrate (IMDUR) 24 hr tablet 60 mg (has no administration in time range)  mirtazapine (REMERON) tablet 7.5 mg (has no administration in time range)  ondansetron (ZOFRAN) tablet 4 mg (has no administration in time range)  polyethylene glycol 0.4% and propylene glycol 0.3% (SYSTANE) ophthalmic gel (has no administration in time range)  tiZANidine (ZANAFLEX) tablet 4 mg (has no administration in time range)  sodium chloride flush (NS) 0.9 % injection 3 mL (3 mLs Intravenous Given 07/29/23 1719)  LORazepam (ATIVAN) injection 1 mg (1 mg Intravenous Given 07/29/23 1745)                                                                                                                                     Procedures Procedures  (including critical care time)  Medical Decision Making / ED Course   This patient presents to the ED for concern of feeling unwell, this involves an extensive number of treatment options, and is a complaint that carries with it a high risk of complications and morbidity.  The differential diagnosis includes less likely infectious, metabolic abnormalities, deconditioning, less likely CVA, viral syndrome.  MDM: On exam, patient overall looks quite well.  She is in rate controlled atrial fibrillation.  She does take Eliquis.  My dependent review the patient's head CT shows no intracranial hemorrhage.  I have ordered the patient's home medications.  Will obtain blood work on the patient.  She did complete a full session of dialysis today.  Patient tells me that she lives with her daughter, does not feel her daughter is able to take care of her.  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  Regarding the patient's episode earlier today, wondering  if she just felt poorly following dialysis.  Vital signs here are reassuring.  Neurology recommended MRI to rule out CVA.  Reassessment 7:05 PM-patient's MRI negative.  Reviewed the patient's labs, consistent with her baseline.  Patient is now appropriate for boarder status.  All home meds ordered.   Additional history obtained: -Additional history obtained from EMS -External records from outside source obtained and reviewed including: Chart review including previous notes, labs, imaging, consultation notes   Lab Tests: -I ordered, reviewed, and interpreted labs.   The pertinent results include:   Labs Reviewed  PROTIME-INR - Abnormal; Notable for the following components:      Result Value   Prothrombin Time 16.3 (*)    INR 1.3 (*)    All other components within normal limits  CBC - Abnormal; Notable for the following components:   RBC 3.52 (*)    Hemoglobin 10.5 (*)    HCT 30.4 (*)    All other components within normal limits  COMPREHENSIVE METABOLIC PANEL - Abnormal; Notable for the following components:   Potassium 3.3 (*)    Chloride 93 (*)    Glucose, Bld 179 (*)    Creatinine, Ser 3.16 (*)    Calcium 8.5 (*)    GFR, Estimated 15 (*)    All other components within normal limits  I-STAT CHEM 8, ED - Abnormal; Notable for the following components:   Potassium 3.3 (*)    Chloride 93 (*)    Creatinine, Ser 3.30 (*)    Glucose, Bld 175 (*)    Calcium, Ion 0.86 (*)    Hemoglobin 10.9 (*)    HCT 32.0 (*)    All other components within normal limits  CBG MONITORING, ED - Abnormal; Notable for the following components:   Glucose-Capillary 184 (*)    All other components within normal limits  RESP PANEL BY RT-PCR (RSV, FLU A&B, COVID)  RVPGX2  APTT  DIFFERENTIAL  ETHANOL      EKG atrial fibrillation  EKG  Interpretation Date/Time:    Ventricular Rate:    PR Interval:    QRS Duration:    QT Interval:    QTC Calculation:   R Axis:      Text Interpretation:           Imaging Studies ordered: I ordered imaging studies including CT imaging of the head and MRI I independently visualized and interpreted imaging. I agree with the radiologist interpretation   Medicines ordered and prescription drug management: Meds ordered this encounter  Medications   sodium chloride flush (NS) 0.9 % injection 3 mL   LORazepam (ATIVAN) injection 1 mg    May repeat x1   acetaminophen (TYLENOL) tablet 500 mg   albuterol (VENTOLIN HFA) 108 (90 Base) MCG/ACT inhaler 1-2 puff   amLODipine (NORVASC) tablet 5 mg   atorvastatin (LIPITOR) tablet 80 mg   azelastine (ASTELIN) 0.1 % nasal spray 2 spray   calcium acetate (PHOSLO) capsule 667 mg   carvedilol (COREG) tablet 12.5 mg   gabapentin (NEURONTIN) capsule 100 mg   glipiZIDE (GLUCOTROL XL) 24 hr tablet 2.5 mg   isosorbide mononitrate (IMDUR) 24 hr tablet 60 mg   mirtazapine (REMERON) tablet 7.5 mg   ondansetron (ZOFRAN) tablet 4 mg   polyethylene glycol 0.4% and propylene glycol 0.3% (SYSTANE) ophthalmic gel   tiZANidine (ZANAFLEX) tablet 4 mg    -I have reviewed the patients home medicines and have made adjustments as needed  Critical interventions   Consultations Obtained: I  requested consultation with the neurology team,  and discussed lab and imaging findings as well as pertinent plan - they recommend: MRI   Cardiac Monitoring: The patient was maintained on a cardiac monitor.  I personally viewed and interpreted the cardiac monitored which showed an underlying rhythm of: Atrial fibrillation  Social Determinants of Health:  Factors impacting patients care include: Multiple medical comorbidities including end-stage renal disease   Reevaluation: After the interventions noted above, I reevaluated the patient and found that they have  :improved  Co morbidities that complicate the patient evaluation  Past Medical History:  Diagnosis Date   Allergic rhinitis    Arthritis    Asthma    Brain aneurysm    Chronic kidney disease    Cough    Diabetes mellitus    Diastolic CHF, chronic (HCC) 10/11/2011   GERD (gastroesophageal reflux disease)    History of colon polyps 2012   tubular adenoma    Hyperlipidemia    Hypertension    Neuropathy 10/11/2011   PONV (postoperative nausea and vomiting)    one time after lymph node surgery      Dispostion: I considered admission for this patient, however with her reassuring workup, believe patient will be better served by [facility] consultation to see if the patient could receive some additional resources.  This patient receives dialysis on Monday Wednesday and Friday.  If she is still in the emergency room on Friday 12/20, she will require dialysis.     Final Clinical Impression(s) / ED Diagnoses Final diagnoses:  Weakness     @PCDICTATION @    Anders Simmonds T, DO 07/29/23 1945

## 2023-07-29 NOTE — ED Notes (Signed)
 Pt states she has a chronic cough.

## 2023-07-29 NOTE — ED Triage Notes (Signed)
Pt was in dialysis when she suddenly felt weak all over. Dialysis staff called 911 and when the pt arrived, she is reporting decreased sensation in her left extremities.

## 2023-07-29 NOTE — Code Documentation (Signed)
Stroke Response Nurse Documentation Code Documentation  Paula [name] is a 77 y.o. female arriving to [place]  via [facility] on 07/29/2023 with past medical hx of GERD, diabetes, HTN, hyperlipidemia, diastolic CHF, neuropathy, CKD with HD M/W/F, and brain aneuroysm. On Eliquis (apixaban) daily. Code stroke was activated by EMS.   Patient from dialysis where she was LKW at 1513 and now complaining of sudden onset of generalized weakness. Patient stated she had hypotension during dialysis. EMS noted blurred vision bilaterally in addition to weakness.    Stroke team at the bedside on patient arrival. Labs drawn and patient cleared for CT by EDP. Patient to CT with team. NIHSS 4, see documentation for details and code stroke times. Patient with bilateral leg weakness, left decreased sensation, and Expressive aphasia  on exam. The following imaging was completed:  CT Head. Patient is not a candidate for IV Thrombolytic due to Eliquis. Patient is not a candidate for IR due to exam not consistent with LVO.  Care Plan: Q2 assessments and MRI.   Bedside handoff with ED RN.    Ferman Hamming Stroke Response RN

## 2023-07-29 NOTE — TOC Initial Note (Signed)
Transition of Care [facility]) - Initial/Assessment Note    Patient Details  Name: Paula [name] MRN: [id number] Date of Birth: Jun 30, 1946  Transition of Care [facility]) CM/SW Contact:    Susa Simmonds, LCSWA Phone Number: 07/29/2023, 10:20 PM  Clinical Narrative:   CSW spoke with patient who states she is not getting the proper care at home and needs skilled nursing. Patient is not interested in long term care and states she will discharge back home after rehab or to an assisted living. Patient stated she would like a nice to place to go for skilled nursing. Patient states she lives with her daughter who drinks and does drugs and she is over it. CSW advised patient that physical therapy will see her tomorrow morning and determine what level of care is needed.       Expected Discharge Plan: Skilled Nursing Facility Barriers to Discharge: Continued Medical Work up   Patient Goals and CMS Choice Patient states their goals for this hospitalization and ongoing recovery are:: SNF          Expected Discharge Plan and Services                                              Prior Living Arrangements/Services   Lives with:: Adult Children Patient language and need for interpreter reviewed:: Yes        Need for Family Participation in Patient Care: No (Comment) Care giver support system in place?: No (comment)   Criminal Activity/Legal Involvement Pertinent to Current Situation/Hospitalization: No - Comment as needed  Activities of Daily Living      Permission Sought/Granted                  Emotional Assessment   Attitude/Demeanor/Rapport: Engaged Affect (typically observed): Calm Orientation: : Oriented to Self, Oriented to Place, Oriented to  Time, Oriented to Situation Alcohol / Substance Use: Not Applicable Psych Involvement: No (comment)  Admission diagnosis:  Code Stroke Patient Active Problem List   Diagnosis Date Noted   SNHL (sensory-neural  hearing loss), 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   ESRD on dialysis (HCC) 11/20/2022   Mold exposure 08/14/2022   Opioid withdrawal (HCC) 08/14/2022   ILD (interstitial lung disease) (HCC) 08/14/2022   Secondary hyperparathyroidism, renal (HCC) 08/14/2022   Paroxysmal A-fib (HCC) 05/20/2022   Hypertrophy of nasal turbinates 09/05/2021   Chronic kidney disease, stage 5 (HCC)    Aortic atherosclerosis (HCC) 06/24/2021   Chronic rhinitis 10/01/2017   Seasonal allergic conjunctivitis 10/01/2017   Aneurysm (HCC) 09/24/2017   Acute metabolic encephalopathy 09/02/2017   Allergic rhinitis 08/20/2017   Palpitations 07/10/2016   Gout 01/23/2016   History of cataract 02/27/2015   Seasonal and perennial allergic rhinitis 02/15/2012   Chronic diastolic CHF (congestive heart failure) (HCC) 10/11/2011   GERD (gastroesophageal reflux disease)    Type 2 diabetes mellitus without complication, without long-term current use of insulin (HCC) 07/03/2010   Hyperlipidemia 07/03/2010   Essential hypertension 07/03/2010   PCP:  Hoy Register, MD Pharmacy:   [facility] [address], [address] [place] [address] Phone: [phone] Fax: [phone]  [facility] [address] [place] [address] Phone: [phone] Fax: [phone]  [facility] [address], [place] - [address] [place] [address] Phone:  [phone] Fax: [phone]  [place] [facility] [address] [place] [address] Phone: [phone] Fax: [phone]     Social Drivers of Health (SDOH) Social History: SDOH Screenings   Food Insecurity: Food Insecurity Present (11/20/2022)  Housing: Low Risk  (11/20/2022)  Recent Concern: Housing - Medium Risk (09/09/2022)  Transportation Needs: Unmet Transportation Needs (11/20/2022)  Utilities: At Risk (09/09/2022)  Alcohol  Screen: Low Risk  (11/20/2022)  Depression (PHQ2-9): Low Risk  (11/20/2022)  Financial Resource Strain: Medium Risk (11/20/2022)  Physical Activity: Insufficiently Active (11/20/2022)  Social Connections: Moderately Integrated (11/20/2022)  Recent Concern: Social Connections - Moderately Isolated (09/09/2022)  Stress: No Stress Concern Present (11/20/2022)  Tobacco Use: Low Risk  (05/21/2023)   SDOH Interventions:     Readmission Risk Interventions    09/05/2021   12:31 PM  Readmission Risk Prevention Plan  Transportation Screening Complete  PCP or Specialist Appt within 5-7 Days Complete  Home Care Screening Complete  Medication Review (RN CM) Complete

## 2023-07-29 NOTE — ED Notes (Signed)
Pt went to MRI.

## 2023-07-29 NOTE — Consult Note (Signed)
NEUROLOGY CONSULT NOTE   Date of service: July 29, 2023 Patient Name: Paula [name] MRN:  [id number] DOB:  09-07-1945 Chief Complaint: "Generalized weakness and blurred vision, code stroke" Requesting Provider: Arletha Pili, DO  History of Present Illness  Paula [name] is a 77 y.o. female  has a past medical history of Allergic rhinitis, Arthritis, Asthma, Brain aneurysm, Chronic kidney disease, Cough, Diabetes mellitus, Diastolic CHF, chronic (HCC) (10/11/2011), GERD (gastroesophageal reflux disease), History of colon polyps (2012), Hyperlipidemia, Hypertension, Neuropathy (10/11/2011), and PONV (postoperative nausea and vomiting).   Patient presents from dialysis today after an episode of hypotension (she does not remember what her blood pressure exactly was) where she developed generalized weakness and blurred vision.  Patient also has some diminished sensation on the left side which started sometime today, although she is unsure exactly when.  Patient states that her blood pressure often gets low during dialysis and that she often develops generalized weakness at these times.  She does take Eliquis for atrial fibrillation and peripheral vascular disease.  She states that she had a cold a few weeks ago, but she does have palpitations from time to time, that she has a chronic cough sometimes with difficulty breathing but nothing more than usual and that she has had no other recent symptoms   LKW: 12/17 p.m. Modified rankin score: 3-Moderate disability-requires help but walks WITHOUT assistance IV Thrombolysis: No, on Eliquis and unsure last known well EVT: No, exam not consistent with LVO  NIHSS components Score: Comment  1a Level of Conscious 0[x]  1[]  2[]  3[]      1b LOC Questions 0[x]  1[]  2[]       1c LOC Commands 0[x]  1[]  2[]       2 Best Gaze 0[x]  1[]  2[]       3 Visual 0[x]  1[]  2[]  3[]      4 Facial Palsy 0[x]  1[]  2[]  3[]      5a Motor Arm - left 0[x]  1[]  2[]  3[]  4[]   UN[]    5b Motor Arm - Right 0[x]  1[]  2[]  3[]  4[]  UN[]    6a Motor Leg - Left 0[]  1[x]  2[]  3[]  4[]  UN[]    6b Motor Leg - Right 0[]  1[x]  2[]  3[]  4[]  UN[]    7 Limb Ataxia 0[x]  1[]  2[]  3[]  UN[]     8 Sensory 0[]  1[x]  2[]  UN[]      9 Best Language 0[x]  1[]  2[]  3[]      10 Dysarthria 0[x]  1[]  2[]  UN[]      11 Extinct. and Inattention 0[x]  1[]  2[]       TOTAL:3       ROS  Comprehensive ROS performed and pertinent positives documented in HPI   Past History   Past Medical History:  Diagnosis Date   Allergic rhinitis    Arthritis    Asthma    Brain aneurysm    Chronic kidney disease    Cough    Diabetes mellitus    Diastolic CHF, chronic (HCC) 10/11/2011   GERD (gastroesophageal reflux disease)    History of colon polyps 2012   tubular adenoma    Hyperlipidemia    Hypertension    Neuropathy 10/11/2011   PONV (postoperative nausea and vomiting)    one time after lymph node surgery    Past Surgical History:  Procedure Laterality Date   AV FISTULA PLACEMENT Right 10/15/2021   Procedure: ARTERIOVENOUS (AV) FISTULA  WITH PLACEMENT OF GORE-TEX STRETCH  GRAFT (4-64mmx45cm);  Surgeon: Chuck Hint, MD;  Location: [facility] OR;  Service: Vascular;  Laterality: Right;  CATARACT EXTRACTION     right eye   IR 3D INDEPENDENT WKST  12/14/2017   IR ANGIO INTRA EXTRACRAN SEL COM CAROTID INNOMINATE BILAT MOD SED  09/15/2017   IR ANGIO INTRA EXTRACRAN SEL COM CAROTID INNOMINATE UNI L MOD SED  12/14/2017   IR ANGIO VERTEBRAL SEL VERTEBRAL BILAT MOD SED  09/15/2017   IR RADIOLOGIST EVAL & MGMT  09/10/2017   IR RADIOLOGIST EVAL & MGMT  10/19/2017   lymphatic mass surgery     NASAL TURBINATE REDUCTION     RADIOLOGY WITH ANESTHESIA N/A 12/14/2017   Procedure: RADIOLOGY WITH ANESTHESIA EMBOLIZATION;  Surgeon: Julieanne Cotton, MD;  Location: [facility] OR;  Service: Radiology;  Laterality: N/A;   VIDEO BRONCHOSCOPY Bilateral 11/18/2017   Procedure: VIDEO BRONCHOSCOPY WITHOUT FLUORO;  Surgeon: Leslye Peer, MD;  Location: [facility];  Service: Cardiopulmonary;  Laterality: Bilateral;    Family History: Family History  Problem Relation Age of Onset   Hypertension Mother    Asthma Sister    Colon cancer Neg Hx    Allergic rhinitis Neg Hx    Eczema Neg Hx     Social History  reports that she has never smoked. She has never been exposed to tobacco smoke. She has never used smokeless tobacco. She reports that she does not drink alcohol and does not use drugs.  Allergies  Allergen Reactions   Dust Mite Extract Cough   Peanut-Containing Drug Products Cough   Ace Inhibitors Cough   Lovastatin Other (See Comments)    Generalized body pain    Medications   Current Facility-Administered Medications:    LORazepam (ATIVAN) injection 1 mg, 1 mg, Intravenous, Once, [employer], Cortney E, NP   sodium chloride flush (NS) 0.9 % injection 3 mL, 3 mL, Intravenous, Once, Anders Simmonds T, DO  Current Outpatient Medications:    acetaminophen (TYLENOL) 500 MG tablet, Take 500 mg by mouth every 6 (six) hours as needed for fever, moderate pain or mild pain., Disp: , Rfl:    albuterol (PROAIR HFA) 108 (90 Base) MCG/ACT inhaler, Inhale 1-2 puffs into the lungs every 6 (six) hours as needed for wheezing or shortness of breath., Disp: 6.7 g, Rfl: 2   albuterol (PROVENTIL) (2.5 MG/3ML) 0.083% nebulizer solution, Inhale 3 mLs (1 vial) by nebulization every 6 hours as needed for wheezing or shortness of breath., Disp: 90 mL, Rfl: 1   amLODipine (NORVASC) 5 MG tablet, Take 1 tablet (5 mg total) by mouth daily., Disp: 90 tablet, Rfl: 0   apixaban (ELIQUIS) 5 MG TABS tablet, Take 1 tablet (5 mg total) by mouth 2 (two) times daily., Disp: 180 tablet, Rfl: 1   atorvastatin (LIPITOR) 80 MG tablet, Take 1 tablet (80 mg total) by mouth daily., Disp: 90 tablet, Rfl: 0   azelastine (ASTELIN) 0.1 % nasal spray, Place 2 sprays into both nostrils 2 (two) times daily. Use in each nostril as directed, Disp: 30 mL, Rfl:  12   calcium acetate (PHOSLO) 667 MG capsule, Take 1 capsule by mouth 3 times daily, Disp: 270 capsule, Rfl: 6   calcium acetate (PHOSLO) 667 MG capsule, Take 1 capsule (667 mg total) by mouth 3 (three) times daily with meals., Disp: 270 capsule, Rfl: 2   calcium acetate (PHOSLO) 667 MG capsule, Take 1 capsule (667 mg total) by mouth 3 (three) times daily with meals., Disp: 270 capsule, Rfl: 2   carvedilol (COREG) 12.5 MG tablet, Take 1 tablet (12.5 mg total) by mouth 2 (two)  times daily with a meal. (Take once a day in the evening on hemodialysis days), Disp: 180 tablet, Rfl: 1   ethyl chloride spray, Apply a small amount on skin immediately before cannulation, Disp: 116 mL, Rfl: PRN   fluticasone (FLONASE) 50 MCG/ACT nasal spray, Place 2 sprays into both nostrils daily., Disp: 16 g, Rfl: 12   gabapentin (NEURONTIN) 100 MG capsule, Take 1 capsule (100 mg total) by mouth 3 (three) times daily., Disp: 270 capsule, Rfl: 1   glipiZIDE (GLUCOTROL XL) 2.5 MG 24 hr tablet, Take 1 tablet (2.5 mg total) by mouth daily with breakfast. (needs office visit), Disp: 90 tablet, Rfl: 0   glucose blood (ACCU-CHEK GUIDE) test strip, USE AS INSTRUCTED 2 TIMES DAILY, Disp: 100 each, Rfl: 2   Homeopathic Products (EARACHE DROPS OT), Place 1 drop into both ears daily., Disp: , Rfl:    isosorbide mononitrate (IMDUR) 60 MG 24 hr tablet, Take 1 tablet (60 mg total) by mouth daily., Disp: 90 tablet, Rfl: 0   lidocaine-prilocaine (EMLA) cream, Apply a small amount to dialysis access one hour prior to dialysis. Wrap with plastic wrap., Disp: 30 g, Rfl: PRN   loratadine (CLARITIN) 10 MG tablet, Take 1 tablet (10 mg total) by mouth daily., Disp: 90 tablet, Rfl: 1   midodrine (PROAMATINE) 10 MG tablet, Take 1 tablet (10 mg total) by mouth 3 (three) times a week. Take 30 minutes prior to HD, Disp: 15 tablet, Rfl: 3   mirtazapine (REMERON) 7.5 MG tablet, Take 1 tablet (7.5 mg total) by mouth at bedtime. Stop trazodone., Disp: 30  tablet, Rfl: 0   Misc. Devices MISC, [employer].  Diagnosis - unstable gait, Disp: 1 each, Rfl: 0   Misc. Devices MISC, Fistula sleeve for R arm.  Diagnoses: End-stage renal disease on hemodialysis, Disp: 1 each, Rfl: 0   Misc. Devices MISC, 1. Incontinence supplies.  2. Briefs.  Diagnosis-urinary incontinence, Disp: 1 each, Rfl: 11   Misc. Devices MISC, Nepro dialysis protein drink.  Diagnosis end-stage renal disease, Disp: 30 each, Rfl: 11   Misc. Devices MISC, Blood pressure monitor.  Diagnosis hypertension, Disp: 1 each, Rfl: 0   olopatadine (PATANOL) 0.1 % ophthalmic solution, Place 1 drop into both eyes 2 (two) times daily., Disp: 5 mL, Rfl: 12   ondansetron (ZOFRAN) 4 MG tablet, Take 1 tablet (4 mg total) by mouth every 8 (eight) hours as needed for nausea or vomiting., Disp: 20 tablet, Rfl: 0   Polyethyl Glycol-Propyl Glycol (SYSTANE OP), Place 1 drop into both eyes daily., Disp: , Rfl:    tiZANidine (ZANAFLEX) 4 MG tablet, Take 1 tablet (4 mg total) by mouth every other day. As needed before hemodialysis sessions for muscle spasms, Disp: 60 tablet, Rfl: 1  Vitals   Vitals:   07/29/23 1600  Weight: 74.2 kg  Height: 5\' 3"  (1.6 m)    Body mass index is 28.98 kg/m.  Physical Exam   Constitutional: Chronically ill-appearing elderly patient in no acute distress Psych: Affect appropriate to situation.  Eyes: No scleral injection.  HENT: No OP obstruction.  Head: Normocephalic.  Cardiovascular: Atrial fibrillation on monitor with EMS Respiratory: Effort normal, non-labored breathing.  Skin: WDI.   Neurologic Examination    NEURO:  Mental Status: Alert and oriented to person place time and situation, able to give a history of today's events Speech/Language: speech is without dysarthria or aphasia.  Naming, fluency, and comprehension intact.  Cranial Nerves:  II: PERRL. Visual fields full.  Able to count  fingers even though patient is described as being blurry III, IV, VI: EOMI.  Eyelids elevate symmetrically.  V: Sensation is intact to light touch and diminished on the left VII: Smile is symmetrical. VIII: hearing intact to voice. IX, X: Phonation is normal.  XII: tongue is midline without fasciculations. Motor: Able to move all 4 extremities with good antigravity strength, some drift noted symmetrically in bilateral lower extremities Tone: is normal and bulk is normal Sensation- Intact to light touch bilaterally but diminished on the left. Extinction absent to light touch to DSS.  Coordination: FTN intact bilaterally, HKS: no ataxia in BLE Gait- deferred   Labs/Imaging/Neurodiagnostic studies   CBC:  Recent Labs  Lab Aug 07, 2023 1649 07-Aug-2023 1700  WBC 9.0  --   NEUTROABS 6.9  --   HGB 10.5* 10.9*  HCT 30.4* 32.0*  MCV 86.4  --   PLT 159  --    Basic Metabolic Panel:  Lab Results  Component Value Date   NA 136 08-07-2023   K 3.3 (L) 2023-08-07   CO2 32 02/02/2023   GLUCOSE 175 (H) 2023/08/07   BUN 17 August 07, 2023   CREATININE 3.30 (H) 08/07/2023   CALCIUM 8.9 02/02/2023   GFRNONAA 9 (L) 02/02/2023   GFRAA 22 (L) 09/14/2019   Lipid Panel:  Lab Results  Component Value Date   LDLCALC 48 09/01/2021   HgbA1c:  Lab Results  Component Value Date   HGBA1C 6.5 04/16/2023   Urine Drug Screen:     Component Value Date/Time   LABOPIA NONE DETECTED 12/29/2020 1309   COCAINSCRNUR NONE DETECTED 12/29/2020 1309   LABBENZ NONE DETECTED 12/29/2020 1309   AMPHETMU NONE DETECTED 12/29/2020 1309   THCU NONE DETECTED 12/29/2020 1309   LABBARB NONE DETECTED 12/29/2020 1309    Alcohol Level     Component Value Date/Time   ETH <10 02/02/2023 2128   INR  Lab Results  Component Value Date   INR 1.3 (H) 02/02/2023   APTT  Lab Results  Component Value Date   APTT 27 02/02/2023   AED levels: No results found for: "PHENYTOIN", "ZONISAMIDE", "LAMOTRIGINE", "LEVETIRACETA"  CT Head without contrast(Personally reviewed): No acute abnormality  MRI  Brain(Personally reviewed): Pending  ASSESSMENT   HAEVYN DACOSTA is a 76 y.o. female  has a past medical history of Allergic rhinitis, Arthritis, Asthma, Brain aneurysm, Chronic kidney disease, Cough, Diabetes mellitus, Diastolic CHF, chronic (HCC) (10/11/2011), GERD (gastroesophageal reflux disease), History of colon polyps (2012), Hyperlipidemia, Hypertension, Neuropathy (10/11/2011), and PONV (postoperative nausea and vomiting).  Patient presents from her dialysis facility with an episode of generalized weakness and blurred vision which began after she became hypotensive during her dialysis treatment.  Patient states that this has happened to her in the past.  She also reports diminished sensation on the left side which occurred sometime today, although she is unsure exactly when.  Generalized weakness and blurred vision were likely caused by hypotension and are in keeping with patient's usual symptoms.  Given focal symptom of sensory deficit on the left side, will obtain MRI brain, admit for stroke workup if positive.  RECOMMENDATIONS  -MRI brain -Admit for stroke workup if positive ______________________________________________________________________    Signed, Cortney Harland Dingwall, NP [facility]    Attending Neurohospitalist Addendum Patient seen and examined with APP/Resident. Agree with the history and physical as documented above. Agree with the plan as documented, which I helped formulate. I have edited the note above to reflect my full findings and recommendations. I  have independently reviewed the chart, obtained history, review of systems and examined the patient.I have personally reviewed pertinent head/neck/spine imaging (CT/MRI). Please feel free to call with any questions.  MRI brain negative for acute infarct or other acute intracranial process. No further stroke workup indicated at this time. Neurology to sign off, but please re-engage if new  neurologic concerns arise.  -- Bing Neighbors, MD [facility] [phone]  If 7pm- 7am, please page neurology on call as listed in AMION.

## 2023-07-29 NOTE — ED Notes (Signed)
Ativan is for MRI

## 2023-07-30 DIAGNOSIS — R531 Weakness: Secondary | ICD-10-CM | POA: Diagnosis not present

## 2023-07-30 MED ORDER — APIXABAN 5 MG PO TABS
5.0000 mg | ORAL_TABLET | Freq: Two times a day (BID) | ORAL | Status: DC
  Administered 2023-07-30 – 2023-07-31 (×3): 5 mg via ORAL
  Filled 2023-07-30 (×3): qty 1

## 2023-07-30 NOTE — ED Notes (Signed)
Pt was assisted to the bathroom with a wheelchair.

## 2023-07-30 NOTE — Evaluation (Signed)
Physical Therapy Evaluation Patient Details Name: Paula [name] MRN: [id number] DOB: [birth year] Today's Date: 07/30/2023  History of Present Illness  Paula [name] is a 77 y.o. female here today from dialysis after she began to feel unwell and complained of bilateral blurriness. She has a past medical history of Allergic rhinitis, Arthritis, Asthma, Brain aneurysm, Chronic kidney disease, Cough, Diabetes mellitus, Diastolic CHF, chronic (HCC) (10/11/2011), GERD (gastroesophageal reflux disease), History of colon polyps (2012), Hyperlipidemia, Hypertension, Neuropathy (10/11/2011), and PONV (postoperative nausea and vomiting).  Clinical Impression  Pt presents with admitting diagnosis above. Pt today was able to ambulate short distance just outside of room with RW CGA however required Min A for bed mobility and sit to stand. PTA pt reports that she lives on the second floor of an apartment with her daughter who she states "doesn't treat her well". Pt states that she primarily "pushes herself around" in her rollator while her daughter assists with ADLs occasionally. When asked how she gets up and down the steps to enter her apartment pt states that her neighbor "pulls her up" or dialysis transport assists. Patient will benefit from continued inpatient follow up therapy, <3 hours/day. PT will continue to follow.       If plan is discharge home, recommend the following: A little help with walking and/or transfers;A little help with bathing/dressing/bathroom;Help with stairs or ramp for entrance;Assist for transportation;Assistance with cooking/housework;Direct supervision/assist for medications management   Can travel by private vehicle   No    Equipment Recommendations Other (comment) (Per accepting facility)  Recommendations for Other Services       Functional Status Assessment Patient has had a recent decline in their functional status and demonstrates the ability to make  significant improvements in function in a reasonable and predictable amount of time.     Precautions / Restrictions Precautions Precautions: Fall Restrictions Weight Bearing Restrictions Per Provider Order: No      Mobility  Bed Mobility Overal bed mobility: Needs Assistance Bed Mobility: Supine to Sit, Sit to Supine     Supine to sit: Min assist Sit to supine: Contact guard assist   General bed mobility comments: Pt requested HHA coming EOB. Pt states that she cannot use her R arm due to dialysis however when educated that she could pt still refused.    Transfers Overall transfer level: Needs assistance Equipment used: Rolling walker (2 wheels), 1 person hand held assist Transfers: Sit to/from Stand Sit to Stand: Min assist           General transfer comment: Pt began to stand with RW however again states that she could not use her R arm. Despite repeated education pt still refused to use that arm.    Ambulation/Gait Ambulation/Gait assistance: Contact guard assist Gait Distance (Feet): 15 Feet Assistive device: Rolling walker (2 wheels) Gait Pattern/deviations: Decreased stride length, Step-through pattern Gait velocity: decreased     General Gait Details: Cues for safety and proximity to RW. Distance limited by fatigue.  Stairs            Wheelchair Mobility     Tilt Bed    Modified Rankin (Stroke Patients Only)       Balance Overall balance assessment: Needs assistance Sitting-balance support: Bilateral upper extremity supported, Feet supported Sitting balance-Leahy Scale: Fair     Standing balance support: Bilateral upper extremity supported, During functional activity Standing balance-Leahy Scale: Poor Standing balance comment: Reliant on RW.  Pertinent Vitals/Pain Pain Assessment Pain Assessment: Faces Faces Pain Scale: Hurts little more Pain Location: L ankle Pain Descriptors / Indicators:  Discomfort, Grimacing Pain Intervention(s): Monitored during session    Home Living Family/patient expects to be discharged to:: Private residence Living Arrangements: Children (daughter) Available Help at Discharge: Family;Available 24 hours/day Type of Home: Apartment Home Access: Stairs to enter Entrance Stairs-Rails: Right Entrance Stairs-Number of Steps: 14   Home Layout: One level Home Equipment: Patent examiner (4 wheels);Tub bench;Grab bars - tub/shower;Hand held shower head      Prior Function Prior Level of Function : Needs assist             Mobility Comments: Pt states that she mostly uses rollator at home. Pt states that she mostly uses rollator like a WC around the house. Needs assistance to get to and from dialysis. Pt states that she cant stand for more than 5 minutes. ADLs Comments: Pt states that most of the time she can do ADLs but recently daughter has to help.     Extremity/Trunk Assessment   Upper Extremity Assessment Upper Extremity Assessment: Generalized weakness    Lower Extremity Assessment Lower Extremity Assessment: Generalized weakness    Cervical / Trunk Assessment Cervical / Trunk Assessment: Normal  Communication   Communication Communication: No apparent difficulties  Cognition Arousal: Alert Behavior During Therapy: WFL for tasks assessed/performed Overall Cognitive Status: Within Functional Limits for tasks assessed                                          General Comments General comments (skin integrity, edema, etc.): VSS on RA    Exercises     Assessment/Plan    PT Assessment Patient needs continued PT services  PT Problem List Decreased strength;Decreased range of motion;Decreased balance;Decreased activity tolerance;Decreased mobility;Decreased coordination;Decreased knowledge of use of DME;Decreased safety awareness;Decreased knowledge of precautions;Cardiopulmonary status limiting activity        PT Treatment Interventions DME instruction;Gait training;Stair training;Functional mobility training;Therapeutic activities;Therapeutic exercise;Balance training;Neuromuscular re-education;Patient/family education    PT Goals (Current goals can be found in the Care Plan section)  Acute Rehab PT Goals Patient Stated Goal: to go home PT Goal Formulation: With patient Time For Goal Achievement: 08/13/23 Potential to Achieve Goals: Fair    Frequency Min 1X/week     Co-evaluation               AM-PAC PT "6 Clicks" Mobility  Outcome Measure Help needed turning from your back to your side while in a flat bed without using bedrails?: A Little Help needed moving from lying on your back to sitting on the side of a flat bed without using bedrails?: A Little Help needed moving to and from a bed to a chair (including a wheelchair)?: A Little Help needed standing up from a chair using your arms (e.g., wheelchair or bedside chair)?: A Little Help needed to walk in hospital room?: A Little Help needed climbing 3-5 steps with a railing? : Total 6 Click Score: 16    End of Session Equipment Utilized During Treatment: Gait belt Activity Tolerance: Patient tolerated treatment well Patient left: in bed;with call bell/phone within reach Nurse Communication: Mobility status PT Visit Diagnosis: Other abnormalities of gait and mobility (R26.89)    Time: 1610-9604 PT Time Calculation (min) (ACUTE ONLY): 24 min   Charges:   PT Evaluation $PT Eval Moderate Complexity: 1  Mod PT Treatments $Gait Training: 8-22 mins PT General Charges $$ ACUTE PT VISIT: 1 Visit         Shela Nevin, PT, DPT Acute Rehab Services [phone]   Gladys Damme 07/30/2023, 10:01 AM

## 2023-07-30 NOTE — ED Notes (Signed)
Pt given hospital bed ..  

## 2023-07-30 NOTE — ED Notes (Signed)
Pt ambulatory to the bathroom with standby assistance.  ?

## 2023-07-30 NOTE — Progress Notes (Signed)
Contacted by CSW regarding pt's need for snf placement from the ED. Pt receives out-pt HD at [facility] on MWF 11:15 am chair time. Pt to d/c to snf today. Contacted [facility] to be made aware that pt will d/c to snf today and that pt should resume care tomorrow. Clinic provided the name of snf that pt will d/c to at d/c as well.   Olivia Canter Renal Navigator [phone]

## 2023-07-30 NOTE — ED Notes (Signed)
Unable to give report to [facility] at this time.

## 2023-07-30 NOTE — ED Notes (Signed)
Awaiting glipizide from main pharmacy

## 2023-07-30 NOTE — ED Notes (Signed)
Unable to give pt report to [facility] at this time.

## 2023-07-30 NOTE — ED Notes (Signed)
Pt updated as to admission to [facility] healthcare.

## 2023-07-30 NOTE — ED Provider Notes (Signed)
Emergency Medicine Observation Re-evaluation Note  Paula [name] is a 77 y.o. female, seen on rounds today.  Pt initially presented to the ED for complaints of Code Stroke Currently, the patient is awaiting [facility] consultation for difficulty with care at home.  Initially presented with blurry vision.  Will need HD Friday if still in ED.  Physical Exam  BP (!) 162/80   Pulse 74   Temp 98.2 F (36.8 C) (Oral)   Resp (!) 26   Ht 5\' 3"  (1.6 m)   Wt 74.1 kg   LMP  (LMP Unknown)   SpO2 100%   BMI 28.94 kg/m  Physical Exam General: Resting comfortably Lungs: Normal respiratory effort Psych: Calm, cooperative  ED Course / MDM  EKG:   I have reviewed the labs performed to date as well as medications administered while in observation.  Recent changes in the last 24 hours include none.  Plan  Current plan is for [facility] consultation to help with possible placement.    Laurence Spates, MD 07/30/23 [phone]

## 2023-07-30 NOTE — NC FL2 (Signed)
[place] MEDICAID FL2 LEVEL OF CARE FORM     IDENTIFICATION  Patient Name: Paula [name] Birthdate: 1946-03-07 Sex: female Admission Date (Current Location): 07/29/2023  [employer] [facility] and [place] Number:  Producer, television/film/video and Address:  [facility] [place], [address], [place], [place] [address]      Provider Number: [id number]  Attending Physician Name and Address:  Arletha Pili, DO  Relative Name and Phone Number:       Current Level of Care: Hospital Recommended Level of Care: Skilled Nursing Facility Prior Approval Number:    Date Approved/Denied:   PASRR Number: [id number]  Discharge Plan: SNF    Current Diagnoses: Patient Active Problem List   Diagnosis Date Noted   SNHL (sensory-neural hearing loss), 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   ESRD on dialysis (HCC) 11/20/2022   Mold exposure 08/14/2022   Opioid withdrawal (HCC) 08/14/2022   ILD (interstitial lung disease) (HCC) 08/14/2022   Secondary hyperparathyroidism, renal (HCC) 08/14/2022   Paroxysmal A-fib (HCC) 05/20/2022   Hypertrophy of nasal turbinates 09/05/2021   Chronic kidney disease, stage 5 (HCC)    Aortic atherosclerosis (HCC) 06/24/2021   Chronic rhinitis 10/01/2017   Seasonal allergic conjunctivitis 10/01/2017   Aneurysm (HCC) 09/24/2017   Acute metabolic encephalopathy 09/02/2017   Allergic rhinitis 08/20/2017   Palpitations 07/10/2016   Gout 01/23/2016   History of cataract 02/27/2015   Seasonal and perennial allergic rhinitis 02/15/2012   Chronic diastolic CHF (congestive heart failure) (HCC) 10/11/2011   GERD (gastroesophageal reflux disease)    Type 2 diabetes mellitus without complication, without long-term current use of insulin (HCC) 07/03/2010   Hyperlipidemia 07/03/2010   Essential hypertension 07/03/2010    Orientation RESPIRATION BLADDER Height & Weight     Self, Time, Situation, Place  Normal Continent Weight: 163 lb 5.8 oz (74.1 kg) Height:  5\' 3"   (160 cm)  BEHAVIORAL SYMPTOMS/MOOD NEUROLOGICAL BOWEL NUTRITION STATUS      Continent Diet (See discharge summary)  AMBULATORY STATUS COMMUNICATION OF NEEDS Skin   Limited Assist Verbally Normal                       Personal Care Assistance Level of Assistance  Bathing, Feeding, Dressing Bathing Assistance: Limited assistance Feeding assistance: Independent Dressing Assistance: Limited assistance     Functional Limitations Info  Hearing, Speech, Sight Sight Info: Adequate Hearing Info: Adequate Speech Info: Adequate    SPECIAL CARE FACTORS FREQUENCY  PT (By licensed PT), OT (By licensed OT)     PT Frequency: 5x weekly OT Frequency: 5x weekly            Contractures Contractures Info: Not present    Additional Factors Info  Code Status, Allergies Code Status Info: Full Code Allergies Info: Dust Mite Extract  Peanut-containing Drug Products  Ace Inhibitors  Lovastatin           Current Medications (07/30/2023):  This is the current hospital active medication list Current Facility-Administered Medications  Medication Dose Route Frequency Provider Last Rate Last Admin   acetaminophen (TYLENOL) tablet 500 mg  500 mg Oral Q6H PRN Anders Simmonds T, DO       albuterol (PROVENTIL) (2.5 MG/3ML) 0.083% nebulizer solution 2.5 mg  2.5 mg Nebulization Q6H PRN Jardin, Carla G, RPH       amLODipine (NORVASC) tablet 5 mg  5 mg Oral QHS Jardin, Carla G, RPH       apixaban (ELIQUIS) tablet 5 mg  5 mg Oral  BID Laurence Spates, MD   5 mg at 07/30/23 8119   artificial tears (LACRILUBE) ophthalmic ointment   Both Eyes Daily Anders Simmonds T, DO   Given at 07/30/23 1478   atorvastatin (LIPITOR) tablet 80 mg  80 mg Oral QHS Jardin, Carla G, RPH       azelastine (ASTELIN) 0.1 % nasal spray 2 spray  2 spray Each Nare BID Anders Simmonds T, DO   2 spray at 07/30/23 0948   calcium acetate (PHOSLO) capsule 667 mg  667 mg Oral TID WC Anders Simmonds T, DO   667 mg at 07/30/23 0741    carvedilol (COREG) tablet 12.5 mg  12.5 mg Oral BID WC Doristine Counter, RPH   12.5 mg at 07/30/23 0741   gabapentin (NEURONTIN) capsule 100 mg  100 mg Oral TID Anders Simmonds T, DO   100 mg at 07/30/23 0947   glipiZIDE (GLUCOTROL XL) 24 hr tablet 2.5 mg  2.5 mg Oral Q breakfast Anders Simmonds T, DO       isosorbide mononitrate (IMDUR) 24 hr tablet 60 mg  60 mg Oral Daily Anders Simmonds T, DO   60 mg at 07/30/23 0946   ondansetron (ZOFRAN) tablet 4 mg  4 mg Oral Q8H PRN Arletha Pili, DO       Current Outpatient Medications  Medication Sig Dispense Refill   acetaminophen (TYLENOL) 500 MG tablet Take 500 mg by mouth every 6 (six) hours as needed for fever, moderate pain or mild pain.     albuterol (PROAIR HFA) 108 (90 Base) MCG/ACT inhaler Inhale 1-2 puffs into the lungs every 6 (six) hours as needed for wheezing or shortness of breath. 6.7 g 2   amLODipine (NORVASC) 5 MG tablet Take 1 tablet (5 mg total) by mouth daily. 90 tablet 0   apixaban (ELIQUIS) 5 MG TABS tablet Take 1 tablet (5 mg total) by mouth 2 (two) times daily. 180 tablet 1   atorvastatin (LIPITOR) 80 MG tablet Take 1 tablet (80 mg total) by mouth daily. 90 tablet 0   azelastine (ASTELIN) 0.1 % nasal spray Place 2 sprays into both nostrils 2 (two) times daily. Use in each nostril as directed 30 mL 12   calcium acetate (PHOSLO) 667 MG capsule Take 1 capsule (667 mg total) by mouth 3 (three) times daily with meals. 270 capsule 2   carvedilol (COREG) 12.5 MG tablet Take 1 tablet (12.5 mg total) by mouth 2 (two) times daily with a meal. (Take once a day in the evening on hemodialysis days) 180 tablet 1   fluticasone (FLONASE) 50 MCG/ACT nasal spray Place 2 sprays into both nostrils daily. 16 g 12   gabapentin (NEURONTIN) 100 MG capsule Take 1 capsule (100 mg total) by mouth 3 (three) times daily. 270 capsule 1   glipiZIDE (GLUCOTROL XL) 2.5 MG 24 hr tablet Take 1 tablet (2.5 mg total) by mouth daily with breakfast. (needs office  visit) (Patient taking differently: Take 2.5 mg by mouth 2 (two) times daily before a meal.) 90 tablet 0   Homeopathic Products (EARACHE DROPS OT) Place 1 drop into both ears daily.     isosorbide mononitrate (IMDUR) 60 MG 24 hr tablet Take 1 tablet (60 mg total) by mouth daily. 90 tablet 0   lidocaine-prilocaine (EMLA) cream Apply a small amount to dialysis access one hour prior to dialysis. Wrap with plastic wrap. 30 g PRN   loratadine (CLARITIN) 10 MG tablet Take 1 tablet (10 mg total) by  mouth daily. 90 tablet 1   olopatadine (PATANOL) 0.1 % ophthalmic solution Place 1 drop into both eyes 2 (two) times daily. 5 mL 12   Throat Lozenges (HALLS COUGH DROPS MT) Use as directed 1 each in the mouth or throat daily as needed.     glucose blood (ACCU-CHEK GUIDE) test strip USE AS INSTRUCTED 2 TIMES DAILY 100 each 2   midodrine (PROAMATINE) 10 MG tablet Take 1 tablet (10 mg total) by mouth 3 (three) times a week. Take 30 minutes prior to HD (Patient not taking: Reported on 07/29/2023) 15 tablet 3   Misc. Devices [employer]  Diagnosis - unstable gait 1 each 0   Misc. Devices MISC Fistula sleeve for R arm.  Diagnoses: End-stage renal disease on hemodialysis 1 each 0   Misc. Devices MISC 1. Incontinence supplies.  2. Briefs.  Diagnosis-urinary incontinence 1 each 11   Misc. Devices MISC Nepro dialysis protein drink.  Diagnosis end-stage renal disease 30 each 11   Misc. Devices MISC Blood pressure monitor.  Diagnosis hypertension 1 each 0   ondansetron (ZOFRAN) 4 MG tablet Take 1 tablet (4 mg total) by mouth every 8 (eight) hours as needed for nausea or vomiting. (Patient not taking: Reported on 07/29/2023) 20 tablet 0   tiZANidine (ZANAFLEX) 4 MG tablet Take 1 tablet (4 mg total) by mouth every other day. As needed before hemodialysis sessions for muscle spasms (Patient not taking: Reported on 07/29/2023) 60 tablet 1     Discharge Medications: Please see discharge summary for a list of discharge  medications.  Relevant Imaging Results:  Relevant Lab Results:   Additional Information SSN: [id number]  Inis Sizer, LCSW

## 2023-07-30 NOTE — ED Notes (Signed)
This RN messaged pharmacy regarding missing glipizide dose.

## 2023-07-30 NOTE — Progress Notes (Addendum)
2pm: Patient can be transported to [facility] via [facility] - RN to call when ready. The number for report is [phone]. Room #107B.  1:30pm: CSW spoke with Kia at [facility] to inform her of information. Kia will work to obtain transportation services for patient for HD sessions.   12pm: CSW spoke with patient to present her with bed offer and [facility] was chosen.  CSW submitted for insurance authorization and it was approved immediately [facility] VQ#[id number], next review date is 08/03/23.  Patient goes to HD on MWF at [facility] on [address] [address] in [place] and utilizes [employer]	[place] for transportation.  10:30am; CSW completed SNF work up and faxed patient's clinicals to facilities for review to obtain bed offers.  Edwin Dada, MSW, LCSW Transitions of Care  Clinical Social Worker II [phone]

## 2023-07-30 NOTE — Discharge Instructions (Signed)
Follow-up with your primary care doctor and the provider at your facility.

## 2023-07-31 DIAGNOSIS — I6350 Cerebral infarction due to unspecified occlusion or stenosis of unspecified cerebral artery: Secondary | ICD-10-CM | POA: Diagnosis not present

## 2023-07-31 DIAGNOSIS — N2581 Secondary hyperparathyroidism of renal origin: Secondary | ICD-10-CM | POA: Diagnosis not present

## 2023-07-31 DIAGNOSIS — I10 Essential (primary) hypertension: Secondary | ICD-10-CM | POA: Diagnosis not present

## 2023-07-31 DIAGNOSIS — J45909 Unspecified asthma, uncomplicated: Secondary | ICD-10-CM | POA: Diagnosis not present

## 2023-07-31 DIAGNOSIS — I132 Hypertensive heart and chronic kidney disease with heart failure and with stage 5 chronic kidney disease, or end stage renal disease: Secondary | ICD-10-CM | POA: Diagnosis not present

## 2023-07-31 DIAGNOSIS — Z743 Need for continuous supervision: Secondary | ICD-10-CM | POA: Diagnosis not present

## 2023-07-31 DIAGNOSIS — E1122 Type 2 diabetes mellitus with diabetic chronic kidney disease: Secondary | ICD-10-CM | POA: Diagnosis not present

## 2023-07-31 DIAGNOSIS — N186 End stage renal disease: Secondary | ICD-10-CM | POA: Diagnosis not present

## 2023-07-31 DIAGNOSIS — M150 Primary generalized (osteo)arthritis: Secondary | ICD-10-CM | POA: Diagnosis not present

## 2023-07-31 DIAGNOSIS — R9082 White matter disease, unspecified: Secondary | ICD-10-CM | POA: Diagnosis not present

## 2023-07-31 DIAGNOSIS — H538 Other visual disturbances: Secondary | ICD-10-CM | POA: Diagnosis not present

## 2023-07-31 DIAGNOSIS — I5032 Chronic diastolic (congestive) heart failure: Secondary | ICD-10-CM | POA: Diagnosis not present

## 2023-07-31 DIAGNOSIS — E1142 Type 2 diabetes mellitus with diabetic polyneuropathy: Secondary | ICD-10-CM | POA: Diagnosis not present

## 2023-07-31 DIAGNOSIS — R531 Weakness: Secondary | ICD-10-CM | POA: Diagnosis not present

## 2023-07-31 DIAGNOSIS — M6281 Muscle weakness (generalized): Secondary | ICD-10-CM | POA: Diagnosis not present

## 2023-07-31 DIAGNOSIS — Z992 Dependence on renal dialysis: Secondary | ICD-10-CM | POA: Diagnosis not present

## 2023-07-31 LAB — CBG MONITORING, ED: Glucose-Capillary: 201 mg/dL — ABNORMAL HIGH (ref 70–99)

## 2023-07-31 NOTE — ED Notes (Signed)
Report called to [facility]. [facility] being arranged for transport.

## 2023-07-31 NOTE — Progress Notes (Addendum)
Noted pt has not been d/c to snf as of this morning. Contacted [facility] to be advised of plans for pt to be transported to snf this morning and that pt may not make HD appt today as thought for that reason.   Olivia Canter Renal Navigator [phone]  Addendum at 10:30 am: Contacted clinic to confirm holiday schedule for next week. Pt's HD days next week will be Sunday, Tuesday, Friday. This info was provided to CSW to provide to snf at d/c.

## 2023-07-31 NOTE — ED Provider Notes (Signed)
Emergency Medicine Observation Re-evaluation Note  Paula [name] is a 77 y.o. female, seen on rounds today.  Pt initially presented to the ED for complaints of Code Stroke Currently, the patient is not having any acute complaints.  Physical Exam  BP 139/81   Pulse 84   Temp 98.2 F (36.8 C) (Oral)   Resp 20   Ht 5\' 3"  (1.6 m)   Wt 74.1 kg   LMP  (LMP Unknown)   SpO2 96%   BMI 28.94 kg/m  Physical Exam General: Watching television Lungs: Normal work of breathing Psych: Calm  ED Course / MDM  EKG:EKG Interpretation Date/Time:  Wednesday July 29 2023 17:34:53 EST Ventricular Rate:  102 PR Interval:    QRS Duration:  81 QT Interval:  368 QTC Calculation: 480 R Axis:   -23  Text Interpretation: Atrial fibrillation Borderline left axis deviation Confirmed by Virgina Norfolk [phone]) on 07/30/2023 10:30:36 AM  I have reviewed the labs performed to date as well as medications administered while in observation.  Recent changes in the last 24 hours include accepted to [facility]. Arranging transport now.  Plan  Current plan is for transport to [facility].    Rondel Baton, MD 07/31/23 [phone]

## 2023-07-31 NOTE — ED Notes (Signed)
Pt was Dc'd to [facility]

## 2023-07-31 NOTE — ED Notes (Signed)
Attempted to call report. Was told by the nurse she had to check with the admission coordinator and will call me back. [phone]

## 2023-08-01 DIAGNOSIS — E1142 Type 2 diabetes mellitus with diabetic polyneuropathy: Secondary | ICD-10-CM | POA: Diagnosis not present

## 2023-08-01 DIAGNOSIS — M6281 Muscle weakness (generalized): Secondary | ICD-10-CM | POA: Diagnosis not present

## 2023-08-01 DIAGNOSIS — I5032 Chronic diastolic (congestive) heart failure: Secondary | ICD-10-CM | POA: Diagnosis not present

## 2023-08-01 DIAGNOSIS — N186 End stage renal disease: Secondary | ICD-10-CM | POA: Diagnosis not present

## 2023-08-01 DIAGNOSIS — Z992 Dependence on renal dialysis: Secondary | ICD-10-CM | POA: Diagnosis not present

## 2023-08-02 DIAGNOSIS — N186 End stage renal disease: Secondary | ICD-10-CM | POA: Diagnosis not present

## 2023-08-02 DIAGNOSIS — N2581 Secondary hyperparathyroidism of renal origin: Secondary | ICD-10-CM | POA: Diagnosis not present

## 2023-08-02 DIAGNOSIS — Z992 Dependence on renal dialysis: Secondary | ICD-10-CM | POA: Diagnosis not present

## 2023-08-03 ENCOUNTER — Other Ambulatory Visit (HOSPITAL_COMMUNITY): Payer: Self-pay

## 2023-08-03 ENCOUNTER — Telehealth: Payer: Self-pay

## 2023-08-03 ENCOUNTER — Other Ambulatory Visit: Payer: Self-pay

## 2023-08-03 ENCOUNTER — Telehealth: Payer: Self-pay | Admitting: Family Medicine

## 2023-08-03 NOTE — Telephone Encounter (Signed)
Referral is needed for in home PT

## 2023-08-03 NOTE — Telephone Encounter (Signed)
Copied from CRM [id number]. Topic: Referral - Request for Referral >> Aug 03, 2023  9:57 AM Everette C wrote: Reason for CRM: The patient's daughter has called to request that the patient be referred to the Physical Therapy treatment facility on [facility] [address]. Where they were were previously seen   The patient would like to continue treatment at this location  Please contact further if needed

## 2023-08-03 NOTE — Telephone Encounter (Signed)
I spoke to patient's daughter, Ardeen Fillers.  She explained that her mother went to the ED on 07/29/2023 and was discharged to [facility] for 1 1/2 days.  Her daughter said that the food there was not conducive to a renal diet among other issues. I asked if she left AMA and her daughter said no. She said that her mother was supposed to get therapy while at [facility] and she would like her to get therapy now that she left and she wants her to start therapy on Thursday, 08/06/2023 because she would have received it that day at [facility]. I explained that will not be possible.  A referral will need to be placed and she will be schedule by the rehab center as their availability allows.  When asked why she needs the therapy, she said it was because she is weak and needs to get stronger.  I explained that the provider may need to see her prior to placing that referral.  She said she understood that there is a holiday we are trying to work around but I will get back to her after I hear back from the provider.  The patient prefers outpatient PT at the [facility] therapy department on [address].  She has an appointment with Dr Laural Benes on 08/26/2022 but she does not want to wait that long to be seen. She will need to be evaluated at the [facility] if she needs to be seen prior to placing the referral for PT  Please advise.

## 2023-08-03 NOTE — Telephone Encounter (Signed)
Copied from CRM [id number]. Topic: Referral - Request for Referral >> Aug 03, 2023  9:50 AM Haroldine Laws wrote: Reason for CRM: Pt's daughter Chuck Hint wants to know if Dr. Alvis Lemmings can write an order for inhome PT.  [phone]  [phone]

## 2023-08-03 NOTE — Telephone Encounter (Signed)
Duplicate message. 

## 2023-08-03 NOTE — Telephone Encounter (Signed)
Referral Request - Did the patient discuss referral with their provider in the last year? No (If No - schedule appointment) (If Yes - send message)  Appointment offered? No  Type of order/referral and detailed reason for visit: PT  Preference of office, provider, location: pt refers to go to outpt rehab at [facility]  If referral order, have you been seen by this specialty before? Yes (If Yes, this issue or another issue? When? Where?  Can we respond through MyChart? No

## 2023-08-04 DIAGNOSIS — N2581 Secondary hyperparathyroidism of renal origin: Secondary | ICD-10-CM | POA: Diagnosis not present

## 2023-08-04 DIAGNOSIS — Z992 Dependence on renal dialysis: Secondary | ICD-10-CM | POA: Diagnosis not present

## 2023-08-04 DIAGNOSIS — N186 End stage renal disease: Secondary | ICD-10-CM | POA: Diagnosis not present

## 2023-08-07 DIAGNOSIS — Z992 Dependence on renal dialysis: Secondary | ICD-10-CM | POA: Diagnosis not present

## 2023-08-07 DIAGNOSIS — N186 End stage renal disease: Secondary | ICD-10-CM | POA: Diagnosis not present

## 2023-08-07 DIAGNOSIS — N2581 Secondary hyperparathyroidism of renal origin: Secondary | ICD-10-CM | POA: Diagnosis not present

## 2023-08-09 DIAGNOSIS — N2581 Secondary hyperparathyroidism of renal origin: Secondary | ICD-10-CM | POA: Diagnosis not present

## 2023-08-09 DIAGNOSIS — Z992 Dependence on renal dialysis: Secondary | ICD-10-CM | POA: Diagnosis not present

## 2023-08-09 DIAGNOSIS — N186 End stage renal disease: Secondary | ICD-10-CM | POA: Diagnosis not present

## 2023-08-10 ENCOUNTER — Other Ambulatory Visit (HOSPITAL_COMMUNITY): Payer: Self-pay

## 2023-08-10 ENCOUNTER — Other Ambulatory Visit: Payer: Self-pay | Admitting: Family Medicine

## 2023-08-10 ENCOUNTER — Telehealth: Payer: Self-pay | Admitting: Family Medicine

## 2023-08-10 DIAGNOSIS — E1165 Type 2 diabetes mellitus with hyperglycemia: Secondary | ICD-10-CM

## 2023-08-10 NOTE — Telephone Encounter (Signed)
Requested medication (s) are due for refill today: No  Requested medication (s) are on the active medication list: yes    Last refill: 07/16/23  #90  0 refills  Future visit scheduled   Notes to clinic:Please review. Patients daughter Merrilee Seashore called stated they have lost patient med for her glipiZIDE (GLUCOTROL XL) 2.5 MG 24 hr tablet and need an emergency refill as she has no medication for today. She needs it sent to  [facility] Phone: [phone]  Fax: [phone]        Requested Prescriptions  Pending Prescriptions Disp Refills   glipiZIDE (GLUCOTROL XL) 2.5 MG 24 hr tablet 90 tablet 0    Sig: Take 1 tablet (2.5 mg total) by mouth daily with breakfast. (needs office visit)     Endocrinology:  Diabetes - Sulfonylureas Failed - 08/10/2023 12:07 PM      Failed - Cr in normal range and within 360 days    Creat  Date Value Ref Range Status  06/27/2016 1.52 (H) 0.50 - 0.99 mg/dL Final    Comment:      For patients > or = 75 years of age: The upper reference limit for Creatinine is approximately 13% higher for people identified as African-American.      Creatinine, Ser  Date Value Ref Range Status  07/29/2023 3.30 (H) 0.44 - 1.00 mg/dL Final   Creatinine, Urine  Date Value Ref Range Status  10/14/2021 58.95 mg/dL Final    Comment:    Performed at [facility], [address], [place], [place] [address]         Passed - HBA1C is between 0 and 7.9 and within 180 days    HbA1c, POC (controlled diabetic range)  Date Value Ref Range Status  04/16/2023 6.5 0.0 - 7.0 % Final         Passed - Valid encounter within last 6 months    Recent Outpatient Visits           3 months ago Type 2 diabetes mellitus with hyperglycemia, unspecified whether long term insulin use (HCC)   [facility] [place]. [facility] [place], [place]   7 months ago Muscle spasm   [facility]  [place] [facility] [place]. [facility] Hoy Register, MD   8 months ago Essential hypertension   [facility] - [facility] [place]. [facility] Storm Frisk, MD   11 months ago Encounter for [employer] annual wellness exam   [facility] - [facility] [place]. [facility] Hoy Register, MD   12 months ago Allergic conjunctivitis and rhinitis, bilateral    [facility] [place]. [facility] Storm Frisk, MD       Future Appointments             In 2 weeks Th[place], [place], MD [facility] Endocrinology   In 2 weeks Marcine Matar, MD [facility] [place] - [facility] Of [place] [facility]. [facility]   In 3 weeks Rollene Rotunda, MD [facility]

## 2023-08-10 NOTE — Telephone Encounter (Signed)
Pt's daughter Glynda Jaeger is calling in because she spoke with the pharmacy and they said they couldn't fill the prescription glipiZIDE (GLUCOTROL XL) 2.5 MG 24 hr tablet [[id number]] because they filled a 90 day supply in November and [place] says that is incorrect because pt only had 30 pills. Glynda Jaeger wants to know if it can be sent to the [facility] on [address] instead or if someone can contact the [employer] regarding the medication. Please follow up with Ndeya.

## 2023-08-10 NOTE — Telephone Encounter (Signed)
Patients daughter Merrilee Seashore called stated they have lost patient med for her glipiZIDE (GLUCOTROL XL) 2.5 MG 24 hr tablet and need an emergency refill as she has no medication for today. She needs it sent to  [facility] Phone: [phone]  Fax: [phone]

## 2023-08-11 ENCOUNTER — Other Ambulatory Visit (HOSPITAL_COMMUNITY): Payer: Self-pay

## 2023-08-11 ENCOUNTER — Other Ambulatory Visit: Payer: Self-pay

## 2023-08-11 DIAGNOSIS — Z992 Dependence on renal dialysis: Secondary | ICD-10-CM | POA: Diagnosis not present

## 2023-08-11 DIAGNOSIS — N2581 Secondary hyperparathyroidism of renal origin: Secondary | ICD-10-CM | POA: Diagnosis not present

## 2023-08-11 DIAGNOSIS — N186 End stage renal disease: Secondary | ICD-10-CM | POA: Diagnosis not present

## 2023-08-11 DIAGNOSIS — I129 Hypertensive chronic kidney disease with stage 1 through stage 4 chronic kidney disease, or unspecified chronic kidney disease: Secondary | ICD-10-CM | POA: Diagnosis not present

## 2023-08-13 NOTE — Telephone Encounter (Signed)
 Late entry:  Pharmacy was called and they verified that patient picked up a 90 day supply of glipizide.

## 2023-08-14 DIAGNOSIS — Z992 Dependence on renal dialysis: Secondary | ICD-10-CM | POA: Diagnosis not present

## 2023-08-14 DIAGNOSIS — N186 End stage renal disease: Secondary | ICD-10-CM | POA: Diagnosis not present

## 2023-08-14 DIAGNOSIS — N2581 Secondary hyperparathyroidism of renal origin: Secondary | ICD-10-CM | POA: Diagnosis not present

## 2023-08-15 NOTE — Progress Notes (Deleted)
 Cardiology Office Note:    Date:  08/15/2023   ID:  Paula [name] , DOB [birth year], MRN [id number]  PCP:  Delbert Clam, MD  Cardiologist:  None { Click to update primary MD,subspecialty MD or APP then REFRESH:1}    Referring MD: Dennise Hoes, MD   Chief Complaint: palpitations  History of Present Illness:    Paula [name]  is a 78 y.o. 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 Lavona and presents today for further evaluation of palpitations.   Patient has been followed intermittently by Cardiology in the past. She has been seen by multiple physicians over the years, most recently Dr. Lavona. She has a long history of palpitations and was ultimately diagnosed with atrial fibrillation. She also has a history of HFpEF. Prior Myoview  in 07/2021 was low risk with no evidence of ischemia or prior infarction. PYP scan was ordered around that time as well to rule out cardiac amyloidosis given significant LVH but this was never done. Last Echo in 10/2021 showed LVEF of 70-75% with severe concentric LVH and grade 1 diastolic dysfunction, normal RV function, moderate left atrial enlargement, and no significant valvular disease. She was last seen by Jackee Alberts, NP, in 05/2022 at which time she was doing well from a cardiac standpoint.   Patient presented to the ED on 07/29/2023 from dialysis for further evaluation of generalized weakness and blurred vision. Code STROKE was called in the field. Head CT and brain MRI showed no acute findings. EKG showed rate controlled atrial fibrillation  with no acute ST/T changes. Lab work showed mild hypokalemia and anemia but hemoglobin was around baseline. She was felt to be stable for discharge from the ED.  Patient presents today for follow-up. ***  Coronary Artery Calcifications  She has a history of coronary artery calcifications noted on prior chest CTs. Myoview  in 07/2021 was negative for ischemia.  - No chest pain.  - No aspirin  due to need for full anticoagulation. - Continue station.  Chronic HFpEF Last Echo in 10/2021 showed LVEF of 70-75% with severe concentric LVH and grade 1 diastolic dysfunction, normal RV function, moderate left atrial enlargement, and no significant valvular disease. - Euvolemic on exam. *** - Volume status is managed via dialysis.  - PYP scan was ordered in 2022 for further work-up of severe LVH but patient never completed this. ***  *** Atrial Fibrillation She was noted to be in rate controlled atrial fibrillation at recent ED visit in 07/2023.  - *** - Continue Coreg  12.5mg  twice daily.  - Continue chronic anticoagulation with Eliquis  5mg  twice daily.   Hypertension He has a history of hypertension but also a hypotension during dialysis requiring Midodrine . BP *** - Continue current medications: Amlodipine  5mg  daily, Coreg  12.5mg  twice daily, and Imdur  60mg  daily.   Hyperlipidemia Lipid panel in ***:  - Continue Lipitor  80mg  daily.  Type 2 Diabetes Mellitus Hemoglobin A1c *** - On Glipizide . - She has been referred to Endocrinology and is scheduled to see them next week.   ESRD on Hemodialysis On hemodialysis T/Th/Sat. - Takes Midodrine  on dialysis days.  - Management per Nephrology.   Brain Aneurysm Patient has  a history of left posterior cavernous carotid aneurysm and left supraclinoid ICA aneurysm. Stable on most recent head MRA in 09/2022.  - Followed by Dr. Dolphus.   EKGs/Labs/Other Studies Reviewed:    The following studies were reviewed:  Myoview   07/12/2021: Impressions:   The patient reported dyspnea and nausea during the stress test. Patient also reported vomitng, hypotension and stomach pains.   ECG is normal. ECG rhythm shows normal sinus rhythm. Resting ECG shows no ST-segment deviation.   There were no arrhythmias during stress. There were no arrhythmias during recovery. ECG was interpretable and without significant changes. The ECG was not diagnostic due to pharmacologic protocol.   Diaphragmatic attenuation artifact was present. Image quality affected due to significant extracardiac activity.   LV perfusion is normal. There is no evidence of ischemia. There is no evidence of infarction.   No evidence of transient ischemic dilation (TID) noted.   Prior study available for comparison from 05/17/2008.   The study is normal. The study is low risk. _______________  Echocardiogram 10/16/2021: Impressions: 1. Left ventricular ejection fraction, by estimation, is 70 to 75%. The  left ventricle has hyperdynamic function. The left ventricle has no  regional wall motion abnormalities. There is severe concentric left  ventricular hypertrophy. Left ventricular  diastolic parameters are consistent with Grade I diastolic dysfunction  (impaired relaxation).   2. Right ventricular systolic function is normal. The right ventricular  size is normal. There is normal pulmonary artery systolic pressure. The  estimated right ventricular systolic pressure is 28.0 mmHg.   3. Left atrial size was moderately dilated.   4. The mitral valve is normal in structure. No evidence of mitral valve  regurgitation. No evidence of mitral stenosis.   5. The aortic valve is tricuspid. There is mild calcification of the  aortic valve. Aortic valve regurgitation is trivial. Aortic valve  sclerosis is present, with no evidence of aortic valve stenosis.   6. The inferior vena cava is normal in size with greater than 50%  respiratory variability, suggesting right  atrial pressure of 3 mmHg.    EKG:  EKG ordered today. EKG personally reviewed and demonstrates ***.  Recent Labs: 07/29/2023: ALT 19; BUN 17; Creatinine, Ser 3.30; Hemoglobin 10.9; Platelets 159; Potassium 3.3; Sodium 136  Recent Lipid Panel    Component Value Date/Time   CHOL 106 09/01/2021 1041   CHOL 121 06/17/2021 0904   TRIG 61 09/01/2021 1041   HDL 46 09/01/2021 1041   HDL 46 06/17/2021 0904   CHOLHDL 2.3 09/01/2021 1041   VLDL 12 09/01/2021 1041   LDLCALC 48 09/01/2021 1041   LDLCALC 61 06/17/2021 0904    Physical Exam:    Vital Signs: LMP  (LMP Unknown)     Wt Readings from Last 3 Encounters:  07/29/23 163 lb 5.8 oz (74.1 kg)  05/19/23 160 lb (72.6 kg)  04/16/23 159 lb 3.2 oz (72.2 kg)     General: 78 y.o. female in no acute distress. HEENT: Normocephalic and atraumatic. Sclera clear.  Neck: Supple. No carotid bruits. No JVD. Heart: *** RRR. Distinct S1 and S2. No murmurs, gallops, or rubs.  Lungs: No increased work of breathing. Clear to ausculation bilaterally. No wheezes, rhonchi, or rales.  Abdomen: Soft, non-distended, and non-tender to palpation.  Extremities: No lower extremity edema.  Radial and distal pedal pulses 2+ and equal bilaterally. Skin: Warm and dry. Neuro: No focal deficits. Psych: Normal affect. Responds appropriately.   Assessment:    No diagnosis found.  Plan:     Disposition: Follow up in ***   Signed, Aline FORBES Jadine DEVONNA  08/15/2023 6:37 PM    [facility]

## 2023-08-16 ENCOUNTER — Other Ambulatory Visit: Payer: Self-pay

## 2023-08-16 ENCOUNTER — Inpatient Hospital Stay (HOSPITAL_COMMUNITY)
Admission: EM | Admit: 2023-08-16 | Discharge: 2023-08-20 | DRG: 193 | Disposition: A | Discharge status: Home / Self Care | Payer: Medicare HMO | Attending: Internal Medicine | Admitting: Internal Medicine

## 2023-08-16 ENCOUNTER — Encounter (HOSPITAL_COMMUNITY): Payer: Self-pay

## 2023-08-16 ENCOUNTER — Emergency Department (HOSPITAL_COMMUNITY): Payer: Medicare HMO

## 2023-08-16 DIAGNOSIS — E1165 Type 2 diabetes mellitus with hyperglycemia: Secondary | ICD-10-CM | POA: Diagnosis present

## 2023-08-16 DIAGNOSIS — N2581 Secondary hyperparathyroidism of renal origin: Secondary | ICD-10-CM | POA: Diagnosis present

## 2023-08-16 DIAGNOSIS — E785 Hyperlipidemia, unspecified: Secondary | ICD-10-CM | POA: Diagnosis present

## 2023-08-16 DIAGNOSIS — J810 Acute pulmonary edema: Secondary | ICD-10-CM | POA: Diagnosis not present

## 2023-08-16 DIAGNOSIS — D631 Anemia in chronic kidney disease: Secondary | ICD-10-CM | POA: Diagnosis present

## 2023-08-16 DIAGNOSIS — Z79899 Other long term (current) drug therapy: Secondary | ICD-10-CM | POA: Diagnosis not present

## 2023-08-16 DIAGNOSIS — N186 End stage renal disease: Secondary | ICD-10-CM | POA: Diagnosis present

## 2023-08-16 DIAGNOSIS — Z825 Family history of asthma and other chronic lower respiratory diseases: Secondary | ICD-10-CM

## 2023-08-16 DIAGNOSIS — N179 Acute kidney failure, unspecified: Secondary | ICD-10-CM | POA: Diagnosis present

## 2023-08-16 DIAGNOSIS — I120 Hypertensive chronic kidney disease with stage 5 chronic kidney disease or end stage renal disease: Secondary | ICD-10-CM | POA: Diagnosis not present

## 2023-08-16 DIAGNOSIS — Z9841 Cataract extraction status, right eye: Secondary | ICD-10-CM

## 2023-08-16 DIAGNOSIS — J189 Pneumonia, unspecified organism: Secondary | ICD-10-CM | POA: Diagnosis present

## 2023-08-16 DIAGNOSIS — I5033 Acute on chronic diastolic (congestive) heart failure: Secondary | ICD-10-CM | POA: Diagnosis not present

## 2023-08-16 DIAGNOSIS — J45901 Unspecified asthma with (acute) exacerbation: Secondary | ICD-10-CM | POA: Diagnosis present

## 2023-08-16 DIAGNOSIS — R0602 Shortness of breath: Secondary | ICD-10-CM | POA: Diagnosis not present

## 2023-08-16 DIAGNOSIS — I959 Hypotension, unspecified: Secondary | ICD-10-CM | POA: Diagnosis not present

## 2023-08-16 DIAGNOSIS — R7989 Other specified abnormal findings of blood chemistry: Secondary | ICD-10-CM | POA: Diagnosis present

## 2023-08-16 DIAGNOSIS — E119 Type 2 diabetes mellitus without complications: Secondary | ICD-10-CM

## 2023-08-16 DIAGNOSIS — R778 Other specified abnormalities of plasma proteins: Secondary | ICD-10-CM | POA: Diagnosis not present

## 2023-08-16 DIAGNOSIS — Z7901 Long term (current) use of anticoagulants: Secondary | ICD-10-CM

## 2023-08-16 DIAGNOSIS — Z1152 Encounter for screening for COVID-19: Secondary | ICD-10-CM

## 2023-08-16 DIAGNOSIS — Z7984 Long term (current) use of oral hypoglycemic drugs: Secondary | ICD-10-CM

## 2023-08-16 DIAGNOSIS — J168 Pneumonia due to other specified infectious organisms: Secondary | ICD-10-CM | POA: Diagnosis not present

## 2023-08-16 DIAGNOSIS — Z888 Allergy status to other drugs, medicaments and biological substances status: Secondary | ICD-10-CM | POA: Diagnosis not present

## 2023-08-16 DIAGNOSIS — K219 Gastro-esophageal reflux disease without esophagitis: Secondary | ICD-10-CM | POA: Diagnosis present

## 2023-08-16 DIAGNOSIS — J9601 Acute respiratory failure with hypoxia: Secondary | ICD-10-CM | POA: Diagnosis present

## 2023-08-16 DIAGNOSIS — I4891 Unspecified atrial fibrillation: Secondary | ICD-10-CM | POA: Diagnosis not present

## 2023-08-16 DIAGNOSIS — R062 Wheezing: Secondary | ICD-10-CM | POA: Diagnosis not present

## 2023-08-16 DIAGNOSIS — I10 Essential (primary) hypertension: Secondary | ICD-10-CM | POA: Diagnosis present

## 2023-08-16 DIAGNOSIS — E1142 Type 2 diabetes mellitus with diabetic polyneuropathy: Secondary | ICD-10-CM | POA: Diagnosis present

## 2023-08-16 DIAGNOSIS — I509 Heart failure, unspecified: Secondary | ICD-10-CM | POA: Diagnosis not present

## 2023-08-16 DIAGNOSIS — I480 Paroxysmal atrial fibrillation: Secondary | ICD-10-CM | POA: Diagnosis present

## 2023-08-16 DIAGNOSIS — E8770 Fluid overload, unspecified: Secondary | ICD-10-CM | POA: Diagnosis present

## 2023-08-16 DIAGNOSIS — Z8249 Family history of ischemic heart disease and other diseases of the circulatory system: Secondary | ICD-10-CM | POA: Diagnosis not present

## 2023-08-16 DIAGNOSIS — E1122 Type 2 diabetes mellitus with diabetic chronic kidney disease: Secondary | ICD-10-CM | POA: Diagnosis present

## 2023-08-16 DIAGNOSIS — R739 Hyperglycemia, unspecified: Secondary | ICD-10-CM | POA: Diagnosis not present

## 2023-08-16 DIAGNOSIS — I132 Hypertensive heart and chronic kidney disease with heart failure and with stage 5 chronic kidney disease, or end stage renal disease: Secondary | ICD-10-CM | POA: Diagnosis present

## 2023-08-16 DIAGNOSIS — R0989 Other specified symptoms and signs involving the circulatory and respiratory systems: Secondary | ICD-10-CM | POA: Diagnosis not present

## 2023-08-16 DIAGNOSIS — E8779 Other fluid overload: Secondary | ICD-10-CM | POA: Diagnosis not present

## 2023-08-16 DIAGNOSIS — R531 Weakness: Secondary | ICD-10-CM | POA: Diagnosis not present

## 2023-08-16 DIAGNOSIS — Z91010 Allergy to peanuts: Secondary | ICD-10-CM

## 2023-08-16 DIAGNOSIS — R918 Other nonspecific abnormal finding of lung field: Secondary | ICD-10-CM | POA: Diagnosis not present

## 2023-08-16 DIAGNOSIS — E871 Hypo-osmolality and hyponatremia: Secondary | ICD-10-CM | POA: Diagnosis not present

## 2023-08-16 DIAGNOSIS — Z992 Dependence on renal dialysis: Secondary | ICD-10-CM

## 2023-08-16 HISTORY — DX: End stage renal disease: N18.6

## 2023-08-16 HISTORY — DX: End stage renal disease: Z99.2

## 2023-08-16 LAB — COMPREHENSIVE METABOLIC PANEL
ALT: 27 U/L (ref 0–44)
AST: 33 U/L (ref 15–41)
Albumin: 3.2 g/dL — ABNORMAL LOW (ref 3.5–5.0)
Alkaline Phosphatase: 73 U/L (ref 38–126)
Anion gap: 19 — ABNORMAL HIGH (ref 5–15)
BUN: 75 mg/dL — ABNORMAL HIGH (ref 8–23)
CO2: 26 mmol/L (ref 22–32)
Calcium: 8.8 mg/dL — ABNORMAL LOW (ref 8.9–10.3)
Chloride: 88 mmol/L — ABNORMAL LOW (ref 98–111)
Creatinine, Ser: 9.61 mg/dL — ABNORMAL HIGH (ref 0.44–1.00)
GFR, Estimated: 4 mL/min — ABNORMAL LOW (ref >60)
Glucose, Bld: 295 mg/dL — ABNORMAL HIGH (ref 70–99)
Potassium: 3.8 mmol/L (ref 3.5–5.1)
Sodium: 133 mmol/L — ABNORMAL LOW (ref 135–145)
Total Bilirubin: 0.9 mg/dL (ref 0.0–1.2)
Total Protein: 6.8 g/dL (ref 6.5–8.1)

## 2023-08-16 LAB — CBC WITH DIFFERENTIAL/PLATELET
Abs Immature Granulocytes: 0.05 10*3/uL (ref 0.00–0.07)
Basophils Absolute: 0 10*3/uL (ref 0.0–0.1)
Basophils Relative: 0 %
Eosinophils Absolute: 0.1 10*3/uL (ref 0.0–0.5)
Eosinophils Relative: 2 %
HCT: 25.4 % — ABNORMAL LOW (ref 36.0–46.0)
Hemoglobin: 8.3 g/dL — ABNORMAL LOW (ref 12.0–15.0)
Immature Granulocytes: 1 %
Lymphocytes Relative: 12 %
Lymphs Abs: 0.8 10*3/uL (ref 0.7–4.0)
MCH: 30.4 pg (ref 26.0–34.0)
MCHC: 32.7 g/dL (ref 30.0–36.0)
MCV: 93 fL (ref 80.0–100.0)
Monocytes Absolute: 0.7 10*3/uL (ref 0.1–1.0)
Monocytes Relative: 10 %
Neutro Abs: 5.2 10*3/uL (ref 1.7–7.7)
Neutrophils Relative %: 75 %
Platelets: 183 10*3/uL (ref 150–400)
RBC: 2.73 MIL/uL — ABNORMAL LOW (ref 3.87–5.11)
RDW: 18.9 % — ABNORMAL HIGH (ref 11.5–15.5)
WBC: 7 10*3/uL (ref 4.0–10.5)
nRBC: 0.4 % — ABNORMAL HIGH (ref 0.0–0.2)

## 2023-08-16 LAB — TROPONIN I (HIGH SENSITIVITY)
Troponin I (High Sensitivity): 111 ng/L (ref <18)
Troponin I (High Sensitivity): 98 ng/L — ABNORMAL HIGH (ref <18)

## 2023-08-16 LAB — I-STAT VENOUS BLOOD GAS, ED
Acid-Base Excess: 5 mmol/L — ABNORMAL HIGH (ref 0.0–2.0)
Bicarbonate: 30.9 mmol/L — ABNORMAL HIGH (ref 20.0–28.0)
Calcium, Ion: 1.04 mmol/L — ABNORMAL LOW (ref 1.15–1.40)
HCT: 27 % — ABNORMAL LOW (ref 36.0–46.0)
Hemoglobin: 9.2 g/dL — ABNORMAL LOW (ref 12.0–15.0)
O2 Saturation: 79 %
Potassium: 3.7 mmol/L (ref 3.5–5.1)
Sodium: 131 mmol/L — ABNORMAL LOW (ref 135–145)
TCO2: 33 mmol/L — ABNORMAL HIGH (ref 22–32)
pCO2, Ven: 54.5 mm[Hg] (ref 44–60)
pH, Ven: 7.361 (ref 7.25–7.43)
pO2, Ven: 46 mm[Hg] — ABNORMAL HIGH (ref 32–45)

## 2023-08-16 LAB — RESP PANEL BY RT-PCR (RSV, FLU A&B, COVID)  RVPGX2
Influenza A by PCR: NEGATIVE
Influenza B by PCR: NEGATIVE
Resp Syncytial Virus by PCR: NEGATIVE
SARS Coronavirus 2 by RT PCR: NEGATIVE

## 2023-08-16 LAB — CBG MONITORING, ED: Glucose-Capillary: 250 mg/dL — ABNORMAL HIGH (ref 70–99)

## 2023-08-16 LAB — BRAIN NATRIURETIC PEPTIDE: B Natriuretic Peptide: 2362.8 pg/mL — ABNORMAL HIGH (ref 0.0–100.0)

## 2023-08-16 MED ORDER — SODIUM CHLORIDE 0.9 % IV SOLN
1.0000 g | Freq: Once | INTRAVENOUS | Status: AC
  Administered 2023-08-16: 1 g via INTRAVENOUS
  Filled 2023-08-16: qty 10

## 2023-08-16 MED ORDER — SODIUM CHLORIDE 0.9 % IV SOLN
500.0000 mg | Freq: Once | INTRAVENOUS | Status: AC
  Administered 2023-08-16: 500 mg via INTRAVENOUS
  Filled 2023-08-16: qty 5

## 2023-08-16 MED ORDER — IPRATROPIUM-ALBUTEROL 0.5-2.5 (3) MG/3ML IN SOLN
3.0000 mL | Freq: Once | RESPIRATORY_TRACT | Status: AC
  Administered 2023-08-16: 3 mL via RESPIRATORY_TRACT
  Filled 2023-08-16: qty 3

## 2023-08-16 NOTE — ED Notes (Signed)
 Trop 111. MD made aware.

## 2023-08-16 NOTE — ED Provider Notes (Signed)
 [place] EMERGENCY DEPARTMENT AT [facility] Provider Note   CSN: [id number] Arrival date & time: 08/16/23  1632     History  Chief Complaint  Patient presents with   Weakness    Paula [name]  is a 78 y.o. female with ESRD on HD MWF, diastolic CHF, T2DM, who presents BIB [facility] from home d/t generalized weakness that has been going on for few days. Patient also w/ cough intermittently productive of clear sputum and SOB worse with exertion. She feels SOB as she lays in bed now. Not on oxygen normally. Last dialysis session was Friday, got a full session. Did not miss any of her treatments. Doesn't feel like she has fluid on.  Pt's family reported decreased food intake. Denies f/c, sore throat, CP, abd pain, N/V/D/C, leg swelling. Ems reported orthostatic hypotension. BP 106/58.   Past Medical History:  Diagnosis Date   Allergic rhinitis    Arthritis    Asthma    Brain aneurysm    Chronic kidney disease    Cough    Diabetes mellitus    Diastolic CHF, chronic (HCC) 10/11/2011   GERD (gastroesophageal reflux disease)    History of colon polyps 2012   tubular adenoma    Hyperlipidemia    Hypertension    Neuropathy 10/11/2011   PONV (postoperative nausea and vomiting)    one time after lymph node surgery       Home Medications Prior to Admission medications   Medication Sig Start Date End Date Taking? Authorizing Provider  acetaminophen  (TYLENOL ) 500 MG tablet Take 500 mg by mouth every 6 (six) hours as needed for fever, moderate pain or mild pain.   Yes [provider]  albuterol  (PROAIR  HFA) 108 (90 Base) MCG/ACT inhaler Inhale 1-2 puffs into the lungs every 6 (six) hours as needed for wheezing or shortness of breath. 04/16/23  Yes Danton Jon HERO, PA-C  amLODipine  (NORVASC ) 5 MG tablet Take 1 tablet (5 mg total) by mouth daily. 04/16/23  Yes Danton Jon HERO, PA-C  apixaban  (ELIQUIS ) 5 MG TABS tablet Take 1 tablet (5 mg total) by mouth 2 (two) times  daily. 04/02/23  Yes Newlin, Enobong, MD  atorvastatin  (LIPITOR ) 80 MG tablet Take 1 tablet (80 mg total) by mouth daily. 07/16/23  Yes Newlin, Enobong, MD  azelastine  (ASTELIN ) 0.1 % nasal spray Place 2 sprays into both nostrils 2 (two) times daily. Use in each nostril as directed 01/08/23  Yes Newlin, Enobong, MD  calcium  acetate (PHOSLO ) 667 MG capsule Take 1 capsule (667 mg total) by mouth 3 (three) times daily with meals. 07/17/23  Yes   carvedilol  (COREG ) 12.5 MG tablet Take 1 tablet (12.5 mg total) by mouth 2 (two) times daily with a meal. (Take once a day in the evening on hemodialysis days) 04/16/23  Yes McClung, Jon HERO, PA-C  fluticasone  (FLONASE ) 50 MCG/ACT nasal spray Place 2 sprays into both nostrils daily. 01/08/23  Yes Newlin, Enobong, MD  gabapentin  (NEURONTIN ) 100 MG capsule Take 1 capsule (100 mg total) by mouth 3 (three) times daily. 11/25/22  Yes Newlin, Enobong, MD  glipiZIDE  (GLUCOTROL  XL) 2.5 MG 24 hr tablet Take 1 tablet (2.5 mg total) by mouth daily with breakfast. (needs office visit) Patient taking differently: Take 2.5 mg by mouth See admin instructions. Take 2.5mg  by mouth once a day. If blood sugar is high, take an additional tablet on the same day. 07/16/23  Yes Newlin, Enobong, MD  Homeopathic Products (EARACHE DROPS OT) Place 1  drop into both ears daily as needed (earache).   Yes [provider]  isosorbide  mononitrate (IMDUR ) 60 MG 24 hr tablet Take 1 tablet (60 mg total) by mouth daily. 07/16/23  Yes Newlin, Enobong, MD  lidocaine -prilocaine  (EMLA ) cream Apply a small amount to dialysis access one hour prior to dialysis. Wrap with plastic wrap. 12/10/21  Yes   loratadine  (CLARITIN ) 10 MG tablet Take 1 tablet (10 mg total) by mouth daily. 05/08/23  Yes Newlin, Enobong, MD  midodrine  (PROAMATINE ) 10 MG tablet Take 1 tablet (10 mg total) by mouth 3 (three) times a week. Take 30 minutes prior to HD Patient taking differently: Take 10 mg by mouth See admin instructions. Take  1 tablet by mouth prior to dialysis if blood pressure is low. 07/17/23  Yes   olopatadine  (PATANOL) 0.1 % ophthalmic solution Place 1 drop into both eyes 2 (two) times daily. 08/14/22  Yes Brien Belvie BRAVO, MD  Throat Lozenges (HALLS COUGH DROPS MT) Use as directed 1 each in the mouth or throat daily as needed (cough).   Yes [provider]  glucose blood (ACCU-CHEK GUIDE) test strip USE AS INSTRUCTED 2 TIMES DAILY 04/16/23   Danton Jon HERO, PA-C  Misc. Devices [employer]  Diagnosis - unstable gait 03/05/21   Delbert Clam, MD  Misc. Devices MISC Fistula sleeve for R arm.  Diagnoses: End-stage renal disease on hemodialysis 01/09/22   Delbert Clam, MD  Misc. Devices MISC 1. Incontinence supplies.  2. Briefs.  Diagnosis-urinary incontinence 04/17/22   Delbert Clam, MD  Misc. Devices MISC Nepro dialysis protein drink.  Diagnosis end-stage renal disease 05/16/22   Delbert Clam, MD  Misc. Devices MISC Blood pressure monitor.  Diagnosis hypertension 05/20/22   Delbert Clam, MD  ondansetron  (ZOFRAN ) 4 MG tablet Take 1 tablet (4 mg total) by mouth every 8 (eight) hours as needed for nausea or vomiting. Patient not taking: Reported on 07/29/2023 04/16/23   Danton Jon HERO, PA-C  tiZANidine  (ZANAFLEX ) 4 MG tablet Take 1 tablet (4 mg total) by mouth every other day. As needed before hemodialysis sessions for muscle spasms Patient not taking: Reported on 07/29/2023 01/08/23   Newlin, Enobong, MD      Allergies    Dust mite extract, Peanut-containing drug products, Ace inhibitors, and Lovastatin    Review of Systems   Review of Systems A 10 point review of systems was performed and is negative unless otherwise reported in HPI.  Physical Exam Updated Vital Signs BP (!) 112/53 (BP Location: Left Arm)   Pulse 76   Temp 98.9 F (37.2 C) (Oral)   Resp 20   LMP  (LMP Unknown)   SpO2 100%  Physical Exam General: Normal appearing female, lying in bed.  HEENT: PERRLA, Sclera anicteric,  MMM, trachea midline.  Cardiology: RRR, no murmurs/rubs/gallops. BL radial and DP pulses equal bilaterally.  Resp: Mild tachypnea and mildly increased WOB with diffuse rhonchi bilaterally. Intermittent wet cough. Abd: Soft, non-tender, non-distended. No rebound tenderness or guarding.  GU: Deferred. MSK: Trace pitting edema in BL LEs.  Skin: warm, dry.  Neuro: A&Ox4, CNs II-XII grossly intact. MAEs. Sensation grossly intact.  Psych: Normal mood and affect.   ED Results / Procedures / Treatments   Labs (all labs ordered are listed, but only abnormal results are displayed) Labs Reviewed  CBC WITH DIFFERENTIAL/PLATELET - Abnormal; Notable for the following components:      Result Value   RBC 2.73 (*)    Hemoglobin 8.3 (*)  HCT 25.4 (*)    RDW 18.9 (*)    nRBC 0.4 (*)    All other components within normal limits  COMPREHENSIVE METABOLIC PANEL - Abnormal; Notable for the following components:   Sodium 133 (*)    Chloride 88 (*)    Glucose, Bld 295 (*)    BUN 75 (*)    Creatinine, Ser 9.61 (*)    Calcium  8.8 (*)    Albumin 3.2 (*)    GFR, Estimated 4 (*)    Anion gap 19 (*)    All other components within normal limits  BRAIN NATRIURETIC PEPTIDE - Abnormal; Notable for the following components:   B Natriuretic Peptide 2,362.8 (*)    All other components within normal limits  CBG MONITORING, ED - Abnormal; Notable for the following components:   Glucose-Capillary 250 (*)    All other components within normal limits  I-STAT VENOUS BLOOD GAS, ED - Abnormal; Notable for the following components:   pO2, Ven 46 (*)    Bicarbonate 30.9 (*)    TCO2 33 (*)    Acid-Base Excess 5.0 (*)    Sodium 131 (*)    Calcium , Ion 1.04 (*)    HCT 27.0 (*)    Hemoglobin 9.2 (*)    All other components within normal limits  TROPONIN I (HIGH SENSITIVITY) - Abnormal; Notable for the following components:   Troponin I (High Sensitivity) 111 (*)    All other components within normal limits   TROPONIN I (HIGH SENSITIVITY) - Abnormal; Notable for the following components:   Troponin I (High Sensitivity) 98 (*)    All other components within normal limits  RESP PANEL BY RT-PCR (RSV, FLU A&B, COVID)  [id number]  URINALYSIS, W/ REFLEX TO CULTURE (INFECTION SUSPECTED)    EKG EKG Interpretation Date/Time:  Sunday August 16 2023 16:47:28 EST Ventricular Rate:  70 PR Interval:    QRS Duration:  98 QT Interval:  473 QTC Calculation: 511 R Axis:   11  Text Interpretation: Atrial fibrillation Nonspecific T abnormalities, diffuse leads Prolonged QT interval Confirmed by Franklyn Gills [phone]) on 08/16/2023 5:52:50 PM  Radiology DG Chest Portable 1 View Result Date: 08/16/2023 CLINICAL DATA:  Shortness of breath, wheezing and rhonchi. EXAM: PORTABLE CHEST 1 VIEW COMPARISON:  02/02/2023 FINDINGS: Stable cardiomediastinal contours. Identified bilaterally. Aortic atherosclerosis. Pulmonary vascular congestion. Opacity within the left lower lung obscures the left heart border and may reflect an area of early pneumonia. No opacities identified within the right lung. No signs of pleural effusion or frank interstitial edema. Peribronchial cuffing IMPRESSION: 1. Left lower lung opacity obscures the left heart border and may reflect an area of early pneumonia. 2. Peribronchial cuffing compatible with central airway inflammation. 3. Pulmonary vascular congestion Electronically Signed   By: Waddell Calk M.D.   On: 08/16/2023 17:50    Procedures Procedures    Medications Ordered in ED Medications  ipratropium-albuterol  (DUONEB) 0.5-2.5 (3) MG/3ML nebulizer solution 3 mL (3 mLs Nebulization Given 08/16/23 1911)  cefTRIAXone  (ROCEPHIN ) 1 g in sodium chloride  0.9 % 100 mL IVPB (0 g Intravenous Stopped 08/16/23 2046)  azithromycin  (ZITHROMAX ) 500 mg in sodium chloride  0.9 % 250 mL IVPB (0 mg Intravenous Stopped 08/16/23 2238)    ED Course/ Medical Decision Making/ A&P                          Medical  Decision Making Amount and/or Complexity of Data Reviewed Labs: ordered. Decision-making details documented  in ED Course. Radiology: ordered. Decision-making details documented in ED Course.  Risk Prescription drug management. Decision regarding hospitalization.    This patient presents to the ED for concern of gen weakness, dyspnea/SOB, this involves an extensive number of treatment options, and is a complaint that carries with it a high risk of complications and morbidity.  I considered the following differential and admission for this acute, potentially life threatening condition.   MDM:    DDX for dyspnea, generalized weakness includes but is not limited to:  Pneumonia, pulmonary effusion, pulm edema given rhonchi/crackles/wheezing bilaterally. Consider viral infection vs bronchitis as well. Doesn't have any pitting edema in LEs but could have HF exacerbation or volume overload as well. She does have some wheezing with no h/o COPD/ reactive airway disease - will give a duoneb and assess response. Considered PE but believe less likely given auscultation findings. No CP or ischemic findings on EKG but consider SOB anginal equivalent and will get troponin to r/o ACS. Consider also severe metabolic derangements or electrolyte abnormalities, anemia. Patient is mildly tachypneic with new oxygen requirement as well. Does not normally use oxygen. Currently utilizing 3L [place].   Clinical Course as of 08/16/23 2336  Sun Aug 16, 2023  1753 DG Chest Portable 1 View 1. Left lower lung opacity obscures the left heart border and may reflect an area of early pneumonia. 2. Peribronchial cuffing compatible with central airway inflammation. 3. Pulmonary vascular congestion   [HN]  1905 Trop 111 [HN]  1910 Troponin I (High Sensitivity)(!!): 111 Will get delta [HN]  1910 Creatinine(!): 9.61 AKI on ESRD on HD [HN]  1910 Will treat for PNA and admit to hospitalist. [HN]  1929 B Natriuretic Peptide(!):  2,362.8 PNA +/- pulm edema. Likely will need volume removed w/ dialysis as well. [HN]  1930 Patient refused breathing treatment. [HN]  1930 Orthostatics negative. [HN]  1930 pCO2, Ven: 54.5 No hypercarbia or acidosis [HN]  1931 WBC: 7.0 No leukocytosis. Not SIRS positive. [HN]  1940 Resp panel by RT-PCR (RSV, Flu A&B, Covid) Anterior Nasal Swab neg [HN]  2150 Troponin I (High Sensitivity)(!): 98 Relatively flat, can trend. Will admit to medicine. [HN]    Clinical Course User Index [HN] Franklyn Sid SAILOR, MD    Labs: I Ordered, and personally interpreted labs.  The pertinent results include:  those listed aobve  Imaging Studies ordered: I ordered imaging studies including CXR I independently visualized and interpreted imaging. I agree with the radiologist interpretation  Additional history obtained from chart review, EMS.    Cardiac Monitoring: The patient was maintained on a cardiac monitor.  I personally viewed and interpreted the cardiac monitored which showed an underlying rhythm of: Afib  Reevaluation: After the interventions noted above, I reevaluated the patient and found that they have :improved  Social Determinants of Health: Lives independently  Disposition:  Admit to medicine  Co morbidities that complicate the patient evaluation  Past Medical History:  Diagnosis Date   Allergic rhinitis    Arthritis    Asthma    Brain aneurysm    Chronic kidney disease    Cough    Diabetes mellitus    Diastolic CHF, chronic (HCC) 10/11/2011   GERD (gastroesophageal reflux disease)    History of colon polyps 2012   tubular adenoma    Hyperlipidemia    Hypertension    Neuropathy 10/11/2011   PONV (postoperative nausea and vomiting)    one time after lymph node surgery     Medicines Meds  ordered this encounter  Medications   ipratropium-albuterol  (DUONEB) 0.5-2.5 (3) MG/3ML nebulizer solution 3 mL   cefTRIAXone  (ROCEPHIN ) 1 g in sodium chloride  0.9 % 100 mL IVPB     Antibiotic Indication::   CAP   azithromycin  (ZITHROMAX ) 500 mg in sodium chloride  0.9 % 250 mL IVPB    I have reviewed the patients home medicines and have made adjustments as needed  Problem List / ED Course: Problem List Items Addressed This Visit   None Visit Diagnoses       Pneumonia of left lower lobe due to infectious organism    -  Primary   Relevant Medications   ipratropium-albuterol  (DUONEB) 0.5-2.5 (3) MG/3ML nebulizer solution 3 mL (Completed)   cefTRIAXone  (ROCEPHIN ) 1 g in sodium chloride  0.9 % 100 mL IVPB (Completed)   azithromycin  (ZITHROMAX ) 500 mg in sodium chloride  0.9 % 250 mL IVPB (Completed)     Elevated troponin                       This note was created using dictation software, which may contain spelling or grammatical errors.    Franklyn Sid SAILOR, MD 08/16/23 [phone]

## 2023-08-16 NOTE — ED Triage Notes (Signed)
 Pt BIB [facility] from home d/t generalized weakness that has been going on for few days. Pt's family reported decreased food intake. Pt is a dialysis pt. Did not miss any of her treatments. Ems reported orthostatic hypotension. 106/58.

## 2023-08-17 ENCOUNTER — Encounter (HOSPITAL_COMMUNITY): Payer: Self-pay | Admitting: Internal Medicine

## 2023-08-17 ENCOUNTER — Telehealth: Payer: Self-pay

## 2023-08-17 ENCOUNTER — Observation Stay (HOSPITAL_COMMUNITY): Payer: Medicare HMO

## 2023-08-17 DIAGNOSIS — Z7901 Long term (current) use of anticoagulants: Secondary | ICD-10-CM | POA: Diagnosis not present

## 2023-08-17 DIAGNOSIS — E785 Hyperlipidemia, unspecified: Secondary | ICD-10-CM | POA: Diagnosis present

## 2023-08-17 DIAGNOSIS — I480 Paroxysmal atrial fibrillation: Secondary | ICD-10-CM | POA: Diagnosis present

## 2023-08-17 DIAGNOSIS — Z825 Family history of asthma and other chronic lower respiratory diseases: Secondary | ICD-10-CM | POA: Diagnosis not present

## 2023-08-17 DIAGNOSIS — J189 Pneumonia, unspecified organism: Secondary | ICD-10-CM

## 2023-08-17 DIAGNOSIS — R7989 Other specified abnormal findings of blood chemistry: Secondary | ICD-10-CM | POA: Diagnosis present

## 2023-08-17 DIAGNOSIS — J9601 Acute respiratory failure with hypoxia: Secondary | ICD-10-CM | POA: Diagnosis present

## 2023-08-17 DIAGNOSIS — Z992 Dependence on renal dialysis: Secondary | ICD-10-CM | POA: Diagnosis not present

## 2023-08-17 DIAGNOSIS — I509 Heart failure, unspecified: Secondary | ICD-10-CM | POA: Diagnosis not present

## 2023-08-17 DIAGNOSIS — Z79899 Other long term (current) drug therapy: Secondary | ICD-10-CM | POA: Diagnosis not present

## 2023-08-17 DIAGNOSIS — E119 Type 2 diabetes mellitus without complications: Secondary | ICD-10-CM | POA: Diagnosis not present

## 2023-08-17 DIAGNOSIS — Z7984 Long term (current) use of oral hypoglycemic drugs: Secondary | ICD-10-CM | POA: Diagnosis not present

## 2023-08-17 DIAGNOSIS — E8770 Fluid overload, unspecified: Secondary | ICD-10-CM | POA: Diagnosis present

## 2023-08-17 DIAGNOSIS — E8779 Other fluid overload: Secondary | ICD-10-CM

## 2023-08-17 DIAGNOSIS — I10 Essential (primary) hypertension: Secondary | ICD-10-CM

## 2023-08-17 DIAGNOSIS — N186 End stage renal disease: Secondary | ICD-10-CM | POA: Diagnosis present

## 2023-08-17 DIAGNOSIS — E1142 Type 2 diabetes mellitus with diabetic polyneuropathy: Secondary | ICD-10-CM | POA: Diagnosis present

## 2023-08-17 DIAGNOSIS — N179 Acute kidney failure, unspecified: Secondary | ICD-10-CM | POA: Diagnosis present

## 2023-08-17 DIAGNOSIS — Z888 Allergy status to other drugs, medicaments and biological substances status: Secondary | ICD-10-CM | POA: Diagnosis not present

## 2023-08-17 DIAGNOSIS — Z1152 Encounter for screening for COVID-19: Secondary | ICD-10-CM | POA: Diagnosis not present

## 2023-08-17 DIAGNOSIS — K219 Gastro-esophageal reflux disease without esophagitis: Secondary | ICD-10-CM | POA: Diagnosis present

## 2023-08-17 DIAGNOSIS — N2581 Secondary hyperparathyroidism of renal origin: Secondary | ICD-10-CM | POA: Diagnosis present

## 2023-08-17 DIAGNOSIS — I5033 Acute on chronic diastolic (congestive) heart failure: Secondary | ICD-10-CM | POA: Diagnosis not present

## 2023-08-17 DIAGNOSIS — I132 Hypertensive heart and chronic kidney disease with heart failure and with stage 5 chronic kidney disease, or end stage renal disease: Secondary | ICD-10-CM | POA: Diagnosis present

## 2023-08-17 DIAGNOSIS — Z8249 Family history of ischemic heart disease and other diseases of the circulatory system: Secondary | ICD-10-CM | POA: Diagnosis not present

## 2023-08-17 DIAGNOSIS — E871 Hypo-osmolality and hyponatremia: Secondary | ICD-10-CM | POA: Diagnosis not present

## 2023-08-17 DIAGNOSIS — D631 Anemia in chronic kidney disease: Secondary | ICD-10-CM | POA: Diagnosis present

## 2023-08-17 DIAGNOSIS — E1165 Type 2 diabetes mellitus with hyperglycemia: Secondary | ICD-10-CM | POA: Diagnosis present

## 2023-08-17 DIAGNOSIS — J45901 Unspecified asthma with (acute) exacerbation: Secondary | ICD-10-CM | POA: Diagnosis present

## 2023-08-17 DIAGNOSIS — E1122 Type 2 diabetes mellitus with diabetic chronic kidney disease: Secondary | ICD-10-CM | POA: Diagnosis present

## 2023-08-17 HISTORY — DX: Pneumonia, unspecified organism: J18.9

## 2023-08-17 LAB — RENAL FUNCTION PANEL
Albumin: 3.1 g/dL — ABNORMAL LOW (ref 3.5–5.0)
Anion gap: 18 — ABNORMAL HIGH (ref 5–15)
BUN: 85 mg/dL — ABNORMAL HIGH (ref 8–23)
CO2: 27 mmol/L (ref 22–32)
Calcium: 8.2 mg/dL — ABNORMAL LOW (ref 8.9–10.3)
Chloride: 92 mmol/L — ABNORMAL LOW (ref 98–111)
Creatinine, Ser: 10.87 mg/dL — ABNORMAL HIGH (ref 0.44–1.00)
GFR, Estimated: 3 mL/min — ABNORMAL LOW (ref >60)
Glucose, Bld: 97 mg/dL (ref 70–99)
Phosphorus: 5.8 mg/dL — ABNORMAL HIGH (ref 2.5–4.6)
Potassium: 3.6 mmol/L (ref 3.5–5.1)
Sodium: 137 mmol/L (ref 135–145)

## 2023-08-17 LAB — CBG MONITORING, ED
Glucose-Capillary: 97 mg/dL (ref 70–99)
Glucose-Capillary: 130 mg/dL — ABNORMAL HIGH (ref 70–99)

## 2023-08-17 LAB — HEPATITIS B SURFACE ANTIGEN: Hepatitis B Surface Ag: NONREACTIVE

## 2023-08-17 LAB — BASIC METABOLIC PANEL
Anion gap: 19 — ABNORMAL HIGH (ref 5–15)
BUN: 85 mg/dL — ABNORMAL HIGH (ref 8–23)
CO2: 27 mmol/L (ref 22–32)
Calcium: 8.2 mg/dL — ABNORMAL LOW (ref 8.9–10.3)
Chloride: 90 mmol/L — ABNORMAL LOW (ref 98–111)
Creatinine, Ser: 10.87 mg/dL — ABNORMAL HIGH (ref 0.44–1.00)
GFR, Estimated: 3 mL/min — ABNORMAL LOW (ref >60)
Glucose, Bld: 99 mg/dL (ref 70–99)
Potassium: 3.5 mmol/L (ref 3.5–5.1)
Sodium: 136 mmol/L (ref 135–145)

## 2023-08-17 LAB — ECHOCARDIOGRAM COMPLETE
Area-P 1/2: 4.8 cm2
Calc EF: 62.1 %
S' Lateral: 2.3 cm
Single Plane A2C EF: 64.3 %
Single Plane A4C EF: 61.2 %
Weight: 2613.77 [oz_av]

## 2023-08-17 LAB — GLUCOSE, CAPILLARY
Glucose-Capillary: 166 mg/dL — ABNORMAL HIGH (ref 70–99)
Glucose-Capillary: 400 mg/dL — ABNORMAL HIGH (ref 70–99)

## 2023-08-17 LAB — CBC
HCT: 24.7 % — ABNORMAL LOW (ref 36.0–46.0)
Hemoglobin: 7.9 g/dL — ABNORMAL LOW (ref 12.0–15.0)
MCH: 29.4 pg (ref 26.0–34.0)
MCHC: 32 g/dL (ref 30.0–36.0)
MCV: 91.8 fL (ref 80.0–100.0)
Platelets: 181 10*3/uL (ref 150–400)
RBC: 2.69 MIL/uL — ABNORMAL LOW (ref 3.87–5.11)
RDW: 19.1 % — ABNORMAL HIGH (ref 11.5–15.5)
WBC: 7.2 10*3/uL (ref 4.0–10.5)
nRBC: 0 % (ref 0.0–0.2)

## 2023-08-17 LAB — PROCALCITONIN: Procalcitonin: 0.61 ng/mL

## 2023-08-17 MED ORDER — PENTAFLUOROPROP-TETRAFLUOROETH EX AERO: 1.0000 | INHALATION_SPRAY | CUTANEOUS | Status: DC | PRN

## 2023-08-17 MED ORDER — ATORVASTATIN CALCIUM 80 MG PO TABS
80.0000 mg | ORAL_TABLET | Freq: Every day | ORAL | Status: DC
  Administered 2023-08-19 – 2023-08-20 (×2): 80 mg via ORAL
  Filled 2023-08-17: qty 2
  Filled 2023-08-17 (×3): qty 1

## 2023-08-17 MED ORDER — APIXABAN 5 MG PO TABS
5.0000 mg | ORAL_TABLET | Freq: Two times a day (BID) | ORAL | Status: DC
  Administered 2023-08-17 – 2023-08-20 (×7): 5 mg via ORAL
  Filled 2023-08-17 (×7): qty 1

## 2023-08-17 MED ORDER — ALBUTEROL SULFATE (2.5 MG/3ML) 0.083% IN NEBU: 2.5000 mg | INHALATION_SOLUTION | RESPIRATORY_TRACT | Status: DC | PRN

## 2023-08-17 MED ORDER — MIDODRINE HCL 5 MG PO TABS
10.0000 mg | ORAL_TABLET | Freq: Three times a day (TID) | ORAL | Status: DC
  Administered 2023-08-17: 10 mg via ORAL
  Filled 2023-08-17: qty 2

## 2023-08-17 MED ORDER — FLUTICASONE PROPIONATE 50 MCG/ACT NA SUSP
2.0000 | Freq: Every day | NASAL | Status: DC
  Administered 2023-08-18 – 2023-08-20 (×3): 2 via NASAL
  Filled 2023-08-17: qty 16

## 2023-08-17 MED ORDER — ALTEPLASE 2 MG IJ SOLR: 2.0000 mg | Freq: Once | INTRAMUSCULAR | Status: DC | PRN

## 2023-08-17 MED ORDER — HEPARIN SODIUM (PORCINE) 1000 UNIT/ML DIALYSIS: 1000.0000 [IU] | INTRAMUSCULAR | Status: DC | PRN

## 2023-08-17 MED ORDER — ACETAMINOPHEN 325 MG PO TABS
650.0000 mg | ORAL_TABLET | Freq: Four times a day (QID) | ORAL | Status: DC | PRN
  Filled 2023-08-17 (×2): qty 2

## 2023-08-17 MED ORDER — ONDANSETRON HCL 4 MG/2ML IJ SOLN: 4.0000 mg | Freq: Four times a day (QID) | INTRAMUSCULAR | Status: DC | PRN

## 2023-08-17 MED ORDER — OLOPATADINE HCL 0.1 % OP SOLN
1.0000 [drp] | Freq: Two times a day (BID) | OPHTHALMIC | Status: DC
  Administered 2023-08-17 – 2023-08-20 (×6): 1 [drp] via OPHTHALMIC
  Filled 2023-08-17: qty 5

## 2023-08-17 MED ORDER — MONTELUKAST SODIUM 10 MG PO TABS
10.0000 mg | ORAL_TABLET | Freq: Every day | ORAL | Status: DC
  Administered 2023-08-17 – 2023-08-19 (×3): 10 mg via ORAL
  Filled 2023-08-17 (×3): qty 1

## 2023-08-17 MED ORDER — INSULIN ASPART 100 UNIT/ML IJ SOLN
0.0000 [IU] | Freq: Every day | INTRAMUSCULAR | Status: DC
Start: 2023-08-17 — End: 2023-08-18

## 2023-08-17 MED ORDER — CALCIUM ACETATE (PHOS BINDER) 667 MG PO CAPS
667.0000 mg | ORAL_CAPSULE | Freq: Three times a day (TID) | ORAL | Status: DC
  Administered 2023-08-17 – 2023-08-20 (×9): 667 mg via ORAL
  Filled 2023-08-17 (×9): qty 1

## 2023-08-17 MED ORDER — ACETAMINOPHEN 650 MG RE SUPP: 650.0000 mg | Freq: Four times a day (QID) | RECTAL | Status: DC | PRN

## 2023-08-17 MED ORDER — LIDOCAINE HCL (PF) 1 % IJ SOLN: 5.0000 mL | INTRAMUSCULAR | Status: DC | PRN

## 2023-08-17 MED ORDER — IPRATROPIUM-ALBUTEROL 0.5-2.5 (3) MG/3ML IN SOLN
3.0000 mL | Freq: Four times a day (QID) | RESPIRATORY_TRACT | Status: DC
  Administered 2023-08-17 – 2023-08-20 (×9): 3 mL via RESPIRATORY_TRACT
  Filled 2023-08-17 (×11): qty 3

## 2023-08-17 MED ORDER — LIDOCAINE-PRILOCAINE 2.5-2.5 % EX CREA: 1.0000 | TOPICAL_CREAM | CUTANEOUS | Status: DC | PRN

## 2023-08-17 MED ORDER — SODIUM CHLORIDE 0.9 % IV SOLN
1.0000 g | INTRAVENOUS | Status: DC
  Administered 2023-08-17 – 2023-08-19 (×3): 1 g via INTRAVENOUS
  Filled 2023-08-17 (×3): qty 10

## 2023-08-17 MED ORDER — CARVEDILOL 12.5 MG PO TABS
12.5000 mg | ORAL_TABLET | Freq: Two times a day (BID) | ORAL | Status: DC
  Administered 2023-08-18 – 2023-08-20 (×5): 12.5 mg via ORAL
  Filled 2023-08-17 (×7): qty 1

## 2023-08-17 MED ORDER — GABAPENTIN 100 MG PO CAPS
100.0000 mg | ORAL_CAPSULE | Freq: Every day | ORAL | Status: DC
  Administered 2023-08-17 – 2023-08-20 (×4): 100 mg via ORAL
  Filled 2023-08-17 (×4): qty 1

## 2023-08-17 MED ORDER — METHYLPREDNISOLONE SODIUM SUCC 40 MG IJ SOLR
40.0000 mg | Freq: Two times a day (BID) | INTRAMUSCULAR | Status: AC
Start: 2023-08-17 — End: 2023-08-19
  Administered 2023-08-17 – 2023-08-19 (×3): 40 mg via INTRAVENOUS
  Filled 2023-08-17 (×3): qty 1

## 2023-08-17 MED ORDER — BUDESONIDE 0.25 MG/2ML IN SUSP
0.2500 mg | Freq: Two times a day (BID) | RESPIRATORY_TRACT | Status: DC
  Administered 2023-08-17 – 2023-08-18 (×2): 0.25 mg via RESPIRATORY_TRACT
  Filled 2023-08-17 (×3): qty 2

## 2023-08-17 MED ORDER — AZITHROMYCIN 250 MG PO TABS
500.0000 mg | ORAL_TABLET | Freq: Every day | ORAL | Status: AC
  Administered 2023-08-17 – 2023-08-20 (×4): 500 mg via ORAL
  Filled 2023-08-17 (×4): qty 2

## 2023-08-17 MED ORDER — INSULIN ASPART 100 UNIT/ML IJ SOLN
0.0000 [IU] | Freq: Three times a day (TID) | INTRAMUSCULAR | Status: DC
  Administered 2023-08-17: 1 [IU] via SUBCUTANEOUS

## 2023-08-17 MED ORDER — ACETAMINOPHEN 500 MG PO TABS
500.0000 mg | ORAL_TABLET | Freq: Four times a day (QID) | ORAL | Status: DC | PRN
  Administered 2023-08-18: 500 mg via ORAL

## 2023-08-17 MED ORDER — ALBUTEROL SULFATE (2.5 MG/3ML) 0.083% IN NEBU: 2.5000 mg | INHALATION_SOLUTION | Freq: Four times a day (QID) | RESPIRATORY_TRACT | Status: DC | PRN

## 2023-08-17 MED ORDER — MIDODRINE HCL 5 MG PO TABS
10.0000 mg | ORAL_TABLET | ORAL | Status: DC
  Administered 2023-08-19: 10 mg via ORAL
  Filled 2023-08-17: qty 2

## 2023-08-17 MED ORDER — ONDANSETRON HCL 4 MG PO TABS: 4.0000 mg | ORAL_TABLET | Freq: Four times a day (QID) | ORAL | Status: DC | PRN

## 2023-08-17 MED ORDER — ANTICOAGULANT SODIUM CITRATE 4% (200MG/5ML) IV SOLN: 5.0000 mL | Status: DC | PRN

## 2023-08-17 MED ORDER — CHLORHEXIDINE GLUCONATE CLOTH 2 % EX PADS
6.0000 | MEDICATED_PAD | Freq: Every day | CUTANEOUS | Status: DC
  Administered 2023-08-18 – 2023-08-20 (×3): 6 via TOPICAL

## 2023-08-17 NOTE — ED Notes (Signed)
 Pt requesting to have medications rescheduled how she takes them at home. RN called pharmacy to make aware.

## 2023-08-17 NOTE — Assessment & Plan Note (Signed)
 Very sensitive SSI AC/HS

## 2023-08-17 NOTE — ED Notes (Signed)
 ED TO INPATIENT HANDOFF REPORT  ED Nurse Name and Phone #: Carlyon GLADE RN [phone]  S Name/Age/Gender Paula [name]  78 y.o. female Room/Bed: 007C/007C  Code Status   Code Status: Full Code  Home/SNF/Other Home Patient oriented to: self, place, time, and situation Is this baseline? Yes   Triage Complete: Triage complete  Chief Complaint Acute respiratory failure with hypoxia (HCC) [J96.01]  Triage Note Pt BIB [facility] from home d/t generalized weakness that has been going on for few days. Pt's family reported decreased food intake. Pt is a dialysis pt. Did not miss any of her treatments. Ems reported orthostatic hypotension. 106/58.    Allergies Allergies  Allergen Reactions   Dust Mite Extract Cough   Peanut-Containing Drug Products Cough   Ace Inhibitors Cough   Lovastatin Other (See Comments)    Generalized body pain    Level of Care/Admitting Diagnosis ED Disposition     ED Disposition  Admit   Condition  --   Comment  Hospital Area: [facility] [[id number]]  Level of Care: Telemetry Cardiac [[id number]]  May place patient in observation at [facility] or [facility] if equivalent level of care is available:: No  Covid Evaluation: Confirmed COVID Negative  Diagnosis: Acute respiratory failure with hypoxia [facility]) [[id number]]  Admitting Physician: LONZELL EMELINE HERO [[id number]]  Attending Physician: LONZELL EMELINE HERO [[id number]]          B Medical/Surgery History Past Medical History:  Diagnosis Date   Allergic rhinitis    Arthritis    Asthma    Brain aneurysm    Cough    Diabetes mellitus    Diastolic CHF, chronic (HCC) 10/11/2011   ESRD on hemodialysis (HCC)    MWF   GERD (gastroesophageal reflux disease)    History of colon polyps 2012   tubular adenoma    Hyperlipidemia    Hypertension    Neuropathy 10/11/2011   PONV (postoperative nausea and vomiting)    one time after lymph node surgery   Past Surgical History:  Procedure Laterality Date   AV  FISTULA PLACEMENT Right 10/15/2021   Procedure: ARTERIOVENOUS (AV) FISTULA  WITH PLACEMENT OF GORE-TEX STRETCH  GRAFT (4-51mmx45cm);  Surgeon: Eliza Lonni RAMAN, MD;  Location: [facility] OR;  Service: Vascular;  Laterality: Right;   CATARACT EXTRACTION     right eye   IR 3D INDEPENDENT WKST  12/14/2017   IR ANGIO INTRA EXTRACRAN SEL COM CAROTID INNOMINATE BILAT MOD SED  09/15/2017   IR ANGIO INTRA EXTRACRAN SEL COM CAROTID INNOMINATE UNI L MOD SED  12/14/2017   IR ANGIO VERTEBRAL SEL VERTEBRAL BILAT MOD SED  09/15/2017   IR RADIOLOGIST EVAL & MGMT  09/10/2017   IR RADIOLOGIST EVAL & MGMT  10/19/2017   lymphatic mass surgery     NASAL TURBINATE REDUCTION     RADIOLOGY WITH ANESTHESIA N/A 12/14/2017   Procedure: RADIOLOGY WITH ANESTHESIA EMBOLIZATION;  Surgeon: Dolphus Carrion, MD;  Location: [facility] OR;  Service: Radiology;  Laterality: N/A;   VIDEO BRONCHOSCOPY Bilateral 11/18/2017   Procedure: VIDEO BRONCHOSCOPY WITHOUT FLUORO;  Surgeon: Shelah Lamar RAMAN, MD;  Location: [facility];  Service: Cardiopulmonary;  Laterality: Bilateral;     A IV Location/Drains/Wounds Patient Lines/Drains/Airways Status     Active Line/Drains/Airways     Name Placement date Placement time Site Days   Peripheral IV 08/16/23 20 G Anterior;Left Forearm 08/16/23  1830  Forearm  1   Fistula / Graft Right Upper arm Arteriovenous vein graft 10/15/21  1215  Upper arm  671            Intake/Output Last 24 hours  Intake/Output Summary (Last 24 hours) at 08/17/2023 1201 Last data filed at 08/16/2023 2238 Gross per 24 hour  Intake 352.31 ml  Output --  Net 352.31 ml    Labs/Imaging Results for orders placed or performed during the hospital encounter of 08/16/23 (from the past 48 hours)  CBC with Differential     Status: Abnormal   Collection Time: 08/16/23  5:45 PM  Result Value Ref Range   WBC 7.0 4.0 - 10.5 K/uL   RBC 2.73 (L) 3.87 - 5.11 MIL/uL   Hemoglobin 8.3 (L) 12.0 - 15.0 g/dL   HCT 74.5 (L) 63.9 -  46.0 %   MCV 93.0 80.0 - 100.0 fL   MCH 30.4 26.0 - 34.0 pg   MCHC 32.7 30.0 - 36.0 g/dL   RDW 81.0 (H) 88.4 - 84.4 %   Platelets 183 150 - 400 K/uL   nRBC 0.4 (H) 0.0 - 0.2 %   Neutrophils Relative % 75 %   Neutro Abs 5.2 1.7 - 7.7 K/uL   Lymphocytes Relative 12 %   Lymphs Abs 0.8 0.7 - 4.0 K/uL   Monocytes Relative 10 %   Monocytes Absolute 0.7 0.1 - 1.0 K/uL   Eosinophils Relative 2 %   Eosinophils Absolute 0.1 0.0 - 0.5 K/uL   Basophils Relative 0 %   Basophils Absolute 0.0 0.0 - 0.1 K/uL   Immature Granulocytes 1 %   Abs Immature Granulocytes 0.05 0.00 - 0.07 K/uL    Comment: Performed at [facility], [address], [place], [place] [address]  Comprehensive metabolic panel     Status: Abnormal   Collection Time: 08/16/23  5:45 PM  Result Value Ref Range   Sodium 133 (L) 135 - 145 mmol/L   Potassium 3.8 3.5 - 5.1 mmol/L   Chloride 88 (L) 98 - 111 mmol/L   CO2 26 22 - 32 mmol/L   Glucose, Bld 295 (H) 70 - 99 mg/dL    Comment: Glucose reference range applies only to samples taken after fasting for at least 8 hours.   BUN 75 (H) 8 - 23 mg/dL   Creatinine, Ser 0.38 (H) 0.44 - 1.00 mg/dL   Calcium  8.8 (L) 8.9 - 10.3 mg/dL   Total Protein 6.8 6.5 - 8.1 g/dL   Albumin 3.2 (L) 3.5 - 5.0 g/dL   AST 33 15 - 41 U/L   ALT 27 0 - 44 U/L   Alkaline Phosphatase 73 38 - 126 U/L   Total Bilirubin 0.9 0.0 - 1.2 mg/dL   GFR, Estimated 4 (L) >60 mL/min    Comment: (NOTE) Calculated using the CKD-EPI Creatinine Equation (2021)    Anion gap 19 (H) 5 - 15    Comment: Performed at [facility], [address], [place], [place] [address]  Troponin I (High Sensitivity)     Status: Abnormal   Collection Time: 08/16/23  5:45 PM  Result Value Ref Range   Troponin I (High Sensitivity) 111 (HH) <18 ng/L    Comment: CRITICAL RESULT CALLED TO, READ BACK BY AND VERIFIED WITH C,YANG RN @1905  08/16/23 E,BENTON (NOTE) Elevated high sensitivity troponin I (hsTnI) values and significant   changes across serial measurements may suggest ACS but many other  chronic and acute conditions are known to elevate hsTnI results.  Refer to the Links section for chest pain algorithms and additional  guidance. Performed at [facility], [address], [place], [place] [address]   I-Stat venous blood gas, [facility] ED, [facility], [facility])     Status: Abnormal   Collection Time: 08/16/23  6:09 PM  Result Value Ref Range   pH, Ven 7.361 7.25 - 7.43   pCO2, Ven 54.5 44 - 60 mmHg   pO2, Ven 46 (H) 32 - 45 mmHg   Bicarbonate 30.9 (H) 20.0 - 28.0 mmol/L   TCO2 33 (H) 22 - 32 mmol/L   O2 Saturation 79 %   Acid-Base Excess 5.0 (H) 0.0 - 2.0 mmol/L   Sodium 131 (L) 135 - 145 mmol/L   Potassium 3.7 3.5 - 5.1 mmol/L   Calcium , Ion 1.04 (L) 1.15 - 1.40 mmol/L   HCT 27.0 (L) 36.0 - 46.0 %   Hemoglobin 9.2 (L) 12.0 - 15.0 g/dL   Sample type VENOUS   POC CBG, ED     Status: Abnormal   Collection Time: 08/16/23  6:22 PM  Result Value Ref Range   Glucose-Capillary 250 (H) 70 - 99 mg/dL    Comment: Glucose reference range applies only to samples taken after fasting for at least 8 hours.  Brain natriuretic peptide     Status: Abnormal   Collection Time: 08/16/23  6:24 PM  Result Value Ref Range   B Natriuretic Peptide 2,362.8 (H) 0.0 - 100.0 pg/mL    Comment: Performed at [facility], [address] [place], [place] [address]  Resp panel by RT-PCR (RSV, Flu A&B, Covid) Anterior Nasal Swab     Status: None   Collection Time: 08/16/23  6:37 PM   Specimen: Anterior Nasal Swab  Result Value Ref Range   SARS Coronavirus 2 by RT PCR NEGATIVE NEGATIVE   Influenza A by PCR NEGATIVE NEGATIVE   Influenza B by PCR NEGATIVE NEGATIVE    Comment: (NOTE) The Xpert Xpress SARS-CoV-2/FLU/RSV plus assay is intended as an aid in the diagnosis of influenza from Nasopharyngeal swab specimens and should not be used as a sole basis for treatment. Nasal washings and aspirates are unacceptable for Xpert Xpress  SARS-CoV-2/FLU/RSV testing.  Fact Sheet for Patients: bloggercourse.com  Fact Sheet for Healthcare Providers: seriousbroker.it  This test is not yet approved or cleared by the [place]  FDA and has been authorized for detection and/or diagnosis of SARS-CoV-2 by FDA under an Emergency Use Authorization (EUA). This EUA will remain in effect (meaning this test can be used) for the duration of the COVID-19 declaration under Section 564(b)(1) of the Act, 21 U.S.C. section 360bbb-3(b)(1), unless the authorization is terminated or revoked.     Resp Syncytial Virus by PCR NEGATIVE NEGATIVE    Comment: (NOTE) Fact Sheet for Patients: bloggercourse.com  Fact Sheet for Healthcare Providers: seriousbroker.it  This test is not yet approved or cleared by the [place]  FDA and has been authorized for detection and/or diagnosis of SARS-CoV-2 by FDA under an Emergency Use Authorization (EUA). This EUA will remain in effect (meaning this test can be used) for the duration of the COVID-19 declaration under Section 564(b)(1) of the Act, 21 U.S.C. section 360bbb-3(b)(1), unless the authorization is terminated or revoked.  Performed at [facility], [address] [place], [place] [address]   Troponin I (High Sensitivity)     Status: Abnormal   Collection Time: 08/16/23  7:11 PM  Result Value Ref Range   Troponin I (High Sensitivity) 98 (H) <18 ng/L    Comment: (NOTE) Elevated  high sensitivity troponin I (hsTnI) values and significant  changes across serial measurements may suggest ACS but many other  chronic and acute conditions are known to elevate hsTnI results.  Refer to the Links section for chest pain algorithms and additional  guidance. Performed at [facility], [address], [place], [place] [address]   Procalcitonin     Status: None   Collection Time:  08/16/23  8:48 PM  Result Value Ref Range   Procalcitonin 0.61 ng/mL    Comment:        Interpretation: PCT > 0.5 ng/mL and <= 2 ng/mL: Systemic infection (sepsis) is possible, but other conditions are known to elevate PCT as well. (NOTE)       Sepsis PCT Algorithm           Lower Respiratory Tract                                      Infection PCT Algorithm    ----------------------------     ----------------------------         PCT < 0.25 ng/mL                PCT < 0.10 ng/mL          Strongly encourage             Strongly discourage   discontinuation of antibiotics    initiation of antibiotics    ----------------------------     -----------------------------       PCT 0.25 - 0.50 ng/mL            PCT 0.10 - 0.25 ng/mL               OR       >80% decrease in PCT            Discourage initiation of                                            antibiotics      Encourage discontinuation           of antibiotics    ----------------------------     -----------------------------         PCT >= 0.50 ng/mL              PCT 0.26 - 0.50 ng/mL                AND       <80% decrease in PCT             Encourage initiation of                                             antibiotics       Encourage continuation           of antibiotics    ----------------------------     -----------------------------        PCT >= 0.50 ng/mL                  PCT > 0.50 ng/mL               AND  increase in PCT                  Strongly encourage                                      initiation of antibiotics    Strongly encourage escalation           of antibiotics                                     -----------------------------                                           PCT <= 0.25 ng/mL                                                 OR                                        > 80% decrease in PCT                                      Discontinue / Do not initiate                                              antibiotics  Performed at [facility], [address] [place], [place] [address]   CBG monitoring, ED     Status: None   Collection Time: 08/17/23  8:01 AM  Result Value Ref Range   Glucose-Capillary 97 70 - 99 mg/dL    Comment: Glucose reference range applies only to samples taken after fasting for at least 8 hours.  CBC     Status: Abnormal   Collection Time: 08/17/23  8:06 AM  Result Value Ref Range   WBC 7.2 4.0 - 10.5 K/uL   RBC 2.69 (L) 3.87 - 5.11 MIL/uL   Hemoglobin 7.9 (L) 12.0 - 15.0 g/dL   HCT 75.2 (L) 63.9 - 53.9 %   MCV 91.8 80.0 - 100.0 fL   MCH 29.4 26.0 - 34.0 pg   MCHC 32.0 30.0 - 36.0 g/dL   RDW 80.8 (H) 88.4 - 84.4 %   Platelets 181 150 - 400 K/uL   nRBC 0.0 0.0 - 0.2 %    Comment: Performed at [facility], [address] [place], [place] [address]  Basic metabolic panel     Status: Abnormal   Collection Time: 08/17/23  8:06 AM  Result Value Ref Range   Sodium 136 135 - 145 mmol/L   Potassium 3.5 3.5 - 5.1 mmol/L   Chloride 90 (L) 98 - 111 mmol/L   CO2 27 22 - 32 mmol/L   Glucose, Bld 99 70 - 99 mg/dL    Comment:  Glucose reference range applies only to samples taken after fasting for at least 8 hours.   BUN 85 (H) 8 - 23 mg/dL   Creatinine, Ser 89.12 (H) 0.44 - 1.00 mg/dL   Calcium  8.2 (L) 8.9 - 10.3 mg/dL   GFR, Estimated 3 (L) >60 mL/min    Comment: (NOTE) Calculated using the CKD-EPI Creatinine Equation (2021)    Anion gap 19 (H) 5 - 15    Comment: Performed at [facility], [address] [place], [place] [address]  Renal function panel     Status: Abnormal   Collection Time: 08/17/23  8:06 AM  Result Value Ref Range   Sodium 137 135 - 145 mmol/L   Potassium 3.6 3.5 - 5.1 mmol/L   Chloride 92 (L) 98 - 111 mmol/L   CO2 27 22 - 32 mmol/L   Glucose, Bld 97 70 - 99 mg/dL    Comment: Glucose reference range applies only to samples taken after fasting for at least 8 hours.   BUN 85 (H) 8 - 23 mg/dL   Creatinine, Ser  89.12 (H) 0.44 - 1.00 mg/dL   Calcium  8.2 (L) 8.9 - 10.3 mg/dL   Phosphorus 5.8 (H) 2.5 - 4.6 mg/dL   Albumin 3.1 (L) 3.5 - 5.0 g/dL   GFR, Estimated 3 (L) >60 mL/min    Comment: (NOTE) Calculated using the CKD-EPI Creatinine Equation (2021)    Anion gap 18 (H) 5 - 15    Comment: Performed at [facility], [address] [place], [place] [address]  Hepatitis B surface antigen     Status: None   Collection Time: 08/17/23  8:06 AM  Result Value Ref Range   Hepatitis B Surface Ag NON REACTIVE NON REACTIVE    Comment: Performed at [facility], [address], [place], [place] [address]  CBG monitoring, ED     Status: Abnormal   Collection Time: 08/17/23 11:49 AM  Result Value Ref Range   Glucose-Capillary 130 (H) 70 - 99 mg/dL    Comment: Glucose reference range applies only to samples taken after fasting for at least 8 hours.   *Note: Due to a large number of results and/or encounters for the requested time period, some results have not been displayed. A complete set of results can be found in Results Review.   ECHOCARDIOGRAM COMPLETE Result Date: 08/17/2023    ECHOCARDIOGRAM REPORT   Patient Name:   Braxton A Watters  Date of Exam: 08/17/2023 Medical Rec #:  [id number]           Height:       63.0 in Accession #:    [id number]          Weight:       163.4 lb Date of Birth:  1945-09-20           BSA:          1.774 m Patient Age:    77 years            BP:           117/65 mmHg Patient Gender: F                   HR:           84 bpm. Exam Location:  Inpatient Procedure: 2D Echo, Cardiac Doppler, Color Doppler and Strain Analysis Indications:    CHF  History:        Patient has prior history  of Echocardiogram examinations, most                 recent 10/16/2021. CHF, Arrythmias:Atrial Fibrillation; Risk                 Factors:Hypertension and Diabetes.  Sonographer:    Lanell Maduro Referring Phys: [id number] JARED M GARDNER IMPRESSIONS  1. Left ventricular ejection fraction, by  estimation, is 60 to 65%. Left ventricular ejection fraction by 2D MOD biplane is 62.1 %. The left ventricle has normal function. The left ventricle has no regional wall motion abnormalities. There is severe concentric left ventricular hypertrophy. Left ventricular diastolic function could not be evaluated.  2. Right ventricular systolic function is low normal. The right ventricular size is normal. Tricuspid regurgitation signal is inadequate for assessing PA pressure.  3. Left atrial size was moderately dilated.  4. The mitral valve is abnormal. Trivial mitral valve regurgitation.  5. The aortic valve is tricuspid. Aortic valve regurgitation is trivial. Aortic valve sclerosis/calcification is present, without any evidence of aortic stenosis.  6. The inferior vena cava is normal in size with greater than 50% respiratory variability, suggesting right atrial pressure of 3 mmHg. Comparison(s): No prior Echocardiogram. FINDINGS  Left Ventricle: Left ventricular ejection fraction, by estimation, is 60 to 65%. Left ventricular ejection fraction by 2D MOD biplane is 62.1 %. The left ventricle has normal function. The left ventricle has no regional wall motion abnormalities. The left ventricular internal cavity size was normal in size. There is severe concentric left ventricular hypertrophy. Left ventricular diastolic function could not be evaluated due to atrial fibrillation. Left ventricular diastolic function could not be evaluated. Right Ventricle: The right ventricular size is normal. No increase in right ventricular wall thickness. Right ventricular systolic function is low normal. Tricuspid regurgitation signal is inadequate for assessing PA pressure. Left Atrium: Left atrial size was moderately dilated. Right Atrium: Right atrial size was normal in size. Pericardium: There is no evidence of pericardial effusion. Mitral Valve: The mitral valve is abnormal. There is mild calcification of the anterior and posterior  mitral valve leaflet(s). Trivial mitral valve regurgitation. Tricuspid Valve: The tricuspid valve is grossly normal. Tricuspid valve regurgitation is trivial. Aortic Valve: The aortic valve is tricuspid. Aortic valve regurgitation is trivial. Aortic valve sclerosis/calcification is present, without any evidence of aortic stenosis. Pulmonic Valve: The pulmonic valve was grossly normal. Pulmonic valve regurgitation is trivial. Aorta: The aortic root and ascending aorta are structurally normal, with no evidence of dilitation. Venous: The inferior vena cava is normal in size with greater than 50% respiratory variability, suggesting right atrial pressure of 3 mmHg. IAS/Shunts: No atrial level shunt detected by color flow Doppler.  LEFT VENTRICLE PLAX 2D                        Biplane EF (MOD) LVIDd:         3.10 cm         LV Biplane EF:   Left LVIDs:         2.30 cm                          ventricular LV PW:         1.80 cm                          ejection LV IVS:        2.00  cm                          fraction by LVOT diam:     2.10 cm                          2D MOD LV SV:         75                               biplane is LV SV Index:   42                               62.1 %. LVOT Area:     3.46 cm                                Diastology                                LV e' medial:    6.31 cm/s LV Volumes (MOD)               LV E/e' medial:  23.3 LV vol d, MOD    41.4 ml       LV e' lateral:   8.27 cm/s A2C:                           LV E/e' lateral: 17.8 LV vol d, MOD    41.8 ml A4C: LV vol s, MOD    14.8 ml A2C: LV vol s, MOD    16.2 ml A4C: LV SV MOD A2C:   26.6 ml LV SV MOD A4C:   41.8 ml LV SV MOD BP:    26.9 ml RIGHT VENTRICLE             IVC RV Basal diam:  3.30 cm     IVC diam: 1.70 cm RV S prime:     10.90 cm/s TAPSE (M-mode): 1.9 cm LEFT ATRIUM             Index        RIGHT ATRIUM           Index LA diam:        3.90 cm 2.20 cm/m   RA Area:     15.90 cm LA Vol (A2C):   86.5 ml 48.75 ml/m  RA  Volume:   35.60 ml  20.06 ml/m LA Vol (A4C):   63.0 ml 35.51 ml/m LA Biplane Vol: 76.8 ml 43.28 ml/m  AORTIC VALVE LVOT Vmax:   108.00 cm/s LVOT Vmean:  81.200 cm/s LVOT VTI:    0.216 m  AORTA Ao Root diam: 2.70 cm Ao Asc diam:  3.20 cm MITRAL VALVE MV Area (PHT): 4.80 cm     SHUNTS MV Decel Time: 158 msec     Systemic VTI:  0.22 m MV E velocity: 147.00 cm/s  Systemic Diam: 2.10 cm Vinie Maxcy MD Electronically signed by Vinie Maxcy MD Signature Date/Time: 08/17/2023/11:20:28 AM    Final    DG Chest Portable 1 View Result Date: 08/16/2023 CLINICAL DATA:  Shortness of breath, wheezing and rhonchi. EXAM: PORTABLE CHEST 1  VIEW COMPARISON:  02/02/2023 FINDINGS: Stable cardiomediastinal contours. Identified bilaterally. Aortic atherosclerosis. Pulmonary vascular congestion. Opacity within the left lower lung obscures the left heart border and may reflect an area of early pneumonia. No opacities identified within the right lung. No signs of pleural effusion or frank interstitial edema. Peribronchial cuffing IMPRESSION: 1. Left lower lung opacity obscures the left heart border and may reflect an area of early pneumonia. 2. Peribronchial cuffing compatible with central airway inflammation. 3. Pulmonary vascular congestion Electronically Signed   By: Waddell Calk M.D.   On: 08/16/2023 17:50    Pending Labs Unresulted Labs (From admission, onward)     Start     Ordered   08/17/23 0700  Hepatitis B surface antibody,quantitative  (New Admission Hemo Labs (Hepatitis B))  ONCE - URGENT,   URGENT        08/17/23 0659   08/17/23 0332  Hemoglobin A1c  Once,   R       Comments: To assess prior glycemic control    08/17/23 0331   08/16/23 1709  Urinalysis, w/ Reflex to Culture (Infection Suspected) -Urine, Clean Catch  Once,   URGENT       Question:  Specimen Source  Answer:  Urine, Clean Catch   08/16/23 1708            Vitals/Pain Today's Vitals   08/17/23 1045 08/17/23 1100 08/17/23 1115 08/17/23  1130  BP: 124/64 137/73 (!) 144/61 (!) 121/91  Pulse: 82 84 86 63  Resp: (!) 27 20 17  (!) 23  Temp:      TempSrc:      SpO2: 96% 93% 92% 91%  Weight:      PainSc:        Isolation Precautions No active isolations  Medications Medications  acetaminophen  (TYLENOL ) tablet 650 mg (has no administration in time range)    Or  acetaminophen  (TYLENOL ) suppository 650 mg (has no administration in time range)  ondansetron  (ZOFRAN ) tablet 4 mg (has no administration in time range)    Or  ondansetron  (ZOFRAN ) injection 4 mg (has no administration in time range)  Chlorhexidine  Gluconate Cloth 2 % PADS 6 each (6 each Topical Not Given 08/17/23 0523)  pentafluoroprop-tetrafluoroeth (GEBAUERS) aerosol 1 Application (has no administration in time range)  lidocaine  (PF) (XYLOCAINE ) 1 % injection 5 mL (has no administration in time range)  lidocaine -prilocaine  (EMLA ) cream 1 Application (has no administration in time range)  heparin  injection 1,000 Units (has no administration in time range)  anticoagulant sodium citrate  solution 5 mL (has no administration in time range)  alteplase  (CATHFLO ACTIVASE ) injection 2 mg (has no administration in time range)  cefTRIAXone  (ROCEPHIN ) 1 g in sodium chloride  0.9 % 100 mL IVPB (has no administration in time range)  azithromycin  (ZITHROMAX ) tablet 500 mg (500 mg Oral Given 08/17/23 1101)  insulin  aspart (novoLOG ) injection 0-5 Units (has no administration in time range)  insulin  aspart (novoLOG ) injection 0-6 Units ( Subcutaneous Not Given 08/17/23 1155)  apixaban  (ELIQUIS ) tablet 5 mg (5 mg Oral Given 08/17/23 1101)  atorvastatin  (LIPITOR ) tablet 80 mg (80 mg Oral Not Given 08/17/23 1102)  calcium  acetate (PHOSLO ) capsule 667 mg (667 mg Oral Not Given 08/17/23 0800)  fluticasone  (FLONASE ) 50 MCG/ACT nasal spray 2 spray (2 sprays Each Nare Not Given 08/17/23 1000)  gabapentin  (NEURONTIN ) capsule 100 mg (100 mg Oral Given 08/17/23 1102)  olopatadine  (PATANOL) 0.1 %  ophthalmic solution 1 drop (1 drop Both Eyes Not Given 08/17/23 1000)  albuterol  (PROVENTIL ) (  2.5 MG/3ML) 0.083% nebulizer solution 2.5 mg (has no administration in time range)  acetaminophen  (TYLENOL ) tablet 500 mg (has no administration in time range)  carvedilol  (COREG ) tablet 12.5 mg (12.5 mg Oral Patient Refused/Not Given 08/17/23 0814)  midodrine  (PROAMATINE ) tablet 10 mg (10 mg Oral Given 08/17/23 1151)  ipratropium-albuterol  (DUONEB) 0.5-2.5 (3) MG/3ML nebulizer solution 3 mL (3 mLs Nebulization Given 08/16/23 1911)  cefTRIAXone  (ROCEPHIN ) 1 g in sodium chloride  0.9 % 100 mL IVPB (0 g Intravenous Stopped 08/16/23 2046)  azithromycin  (ZITHROMAX ) 500 mg in sodium chloride  0.9 % 250 mL IVPB (0 mg Intravenous Stopped 08/16/23 2238)    Mobility walks with person assist     Focused Assessments Pulmonary Assessment Handoff:  Lung sounds: Bilateral Breath Sounds: Expiratory wheezes O2 Device: Nasal Cannula O2 Flow Rate (L/min): 1 L/min    R Recommendations: See Admitting Provider Note  Report given to: 3E22

## 2023-08-17 NOTE — Assessment & Plan Note (Addendum)
 Pt with new O2 requirement: volume overload vs PNA (or both) O2 via [place] Cont pulse ox Treating both PNA and volume overload as below

## 2023-08-17 NOTE — Procedures (Signed)
 Received patient in bed to unit.  Alert and oriented.  Informed consent signed and in chart.   TX duration: 3 hours  Patient tolerated well.  Transported back to the room  Alert, without acute distress.  Hand-off given to patient's nurse.   Access used: ravf Access issues: none noted  Total UF removed: 3500 ml Medication(s) given: none   Greig Silvan, RN [facility]

## 2023-08-17 NOTE — H&P (Signed)
 History and Physical    Patient: Paula [name]  FMW:[id number] DOB: April 19, 1946 DOA: 08/16/2023 DOS: the patient was seen and examined on 08/17/2023 PCP: Delbert Clam, MD  Patient coming from: Home  Chief Complaint:  Chief Complaint  Patient presents with   Weakness   HPI: Paula [name]  is a 78 y.o. 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  Pt in to ED with new O2 requirement and SOB.  Pt last had dialysis on Friday, full session at that time.  Pt and family deny worsening peripheral edema.  Fevers, chills.  Had sneezing a couple of days ago.  No sore throat.  Not on O2 normally.  Soft BPs with EMS.    Review of Systems: As mentioned in the history of present illness. All other systems reviewed and are negative. Past Medical History:  Diagnosis Date   Allergic rhinitis    Arthritis    Asthma    Brain aneurysm    Chronic kidney disease    Cough    Diabetes mellitus    Diastolic CHF, chronic (HCC) 10/11/2011   GERD (gastroesophageal reflux disease)    History of colon polyps 2012   tubular adenoma    Hyperlipidemia    Hypertension    Neuropathy 10/11/2011   PONV (postoperative nausea and vomiting)    one time after lymph node surgery   Past Surgical History:  Procedure Laterality Date   AV FISTULA PLACEMENT Right 10/15/2021   Procedure: ARTERIOVENOUS (AV) FISTULA  WITH PLACEMENT OF GORE-TEX STRETCH  GRAFT (4-43mmx45cm);  Surgeon: Eliza Lonni RAMAN, MD;  Location: [facility] OR;  Service: Vascular;  Laterality: Right;   CATARACT EXTRACTION     right eye   IR 3D INDEPENDENT WKST  12/14/2017   IR ANGIO INTRA EXTRACRAN SEL COM CAROTID INNOMINATE BILAT MOD SED  09/15/2017   IR ANGIO INTRA EXTRACRAN SEL COM CAROTID INNOMINATE UNI L MOD SED  12/14/2017   IR ANGIO VERTEBRAL SEL VERTEBRAL BILAT MOD SED  09/15/2017   IR RADIOLOGIST EVAL & MGMT  09/10/2017   IR RADIOLOGIST EVAL & MGMT  10/19/2017   lymphatic mass  surgery     NASAL TURBINATE REDUCTION     RADIOLOGY WITH ANESTHESIA N/A 12/14/2017   Procedure: RADIOLOGY WITH ANESTHESIA EMBOLIZATION;  Surgeon: Dolphus Carrion, MD;  Location: [facility] OR;  Service: Radiology;  Laterality: N/A;   VIDEO BRONCHOSCOPY Bilateral 11/18/2017   Procedure: VIDEO BRONCHOSCOPY WITHOUT FLUORO;  Surgeon: Shelah Lamar RAMAN, MD;  Location: [facility];  Service: Cardiopulmonary;  Laterality: Bilateral;   Social History:  reports that she has never smoked. She has never been exposed to tobacco smoke. She has never used smokeless tobacco. She reports that she does not drink alcohol  and does not use drugs.  Allergies  Allergen Reactions   Dust Mite Extract Cough   Peanut-Containing Drug Products Cough   Ace Inhibitors Cough   Lovastatin Other (See Comments)    Generalized body pain    Family History  Problem Relation Age of Onset   Hypertension Mother    Asthma Sister    Colon cancer Neg Hx    Allergic rhinitis Neg Hx    Eczema Neg Hx     Prior to Admission medications   Medication Sig Start Date End Date Taking? Authorizing Provider  acetaminophen  (TYLENOL ) 500 MG tablet Take 500 mg by mouth every 6 (six) hours as needed for fever, moderate pain or mild pain.   Yes  [provider]  albuterol  (PROAIR  HFA) 108 (90 Base) MCG/ACT inhaler Inhale 1-2 puffs into the lungs every 6 (six) hours as needed for wheezing or shortness of breath. 04/16/23  Yes Danton Jon HERO, PA-C  amLODipine  (NORVASC ) 5 MG tablet Take 1 tablet (5 mg total) by mouth daily. 04/16/23  Yes Danton Jon HERO, PA-C  apixaban  (ELIQUIS ) 5 MG TABS tablet Take 1 tablet (5 mg total) by mouth 2 (two) times daily. 04/02/23  Yes Newlin, Enobong, MD  atorvastatin  (LIPITOR ) 80 MG tablet Take 1 tablet (80 mg total) by mouth daily. 07/16/23  Yes Newlin, Enobong, MD  azelastine  (ASTELIN ) 0.1 % nasal spray Place 2 sprays into both nostrils 2 (two) times daily. Use in each nostril as directed 01/08/23  Yes Newlin,  Enobong, MD  calcium  acetate (PHOSLO ) 667 MG capsule Take 1 capsule (667 mg total) by mouth 3 (three) times daily with meals. 07/17/23  Yes   carvedilol  (COREG ) 12.5 MG tablet Take 1 tablet (12.5 mg total) by mouth 2 (two) times daily with a meal. (Take once a day in the evening on hemodialysis days) 04/16/23  Yes McClung, Jon HERO, PA-C  fluticasone  (FLONASE ) 50 MCG/ACT nasal spray Place 2 sprays into both nostrils daily. 01/08/23  Yes Newlin, Enobong, MD  gabapentin  (NEURONTIN ) 100 MG capsule Take 1 capsule (100 mg total) by mouth 3 (three) times daily. 11/25/22  Yes Newlin, Enobong, MD  glipiZIDE  (GLUCOTROL  XL) 2.5 MG 24 hr tablet Take 1 tablet (2.5 mg total) by mouth daily with breakfast. (needs office visit) Patient taking differently: Take 2.5 mg by mouth See admin instructions. Take 2.5mg  by mouth once a day. If blood sugar is high, take an additional tablet on the same day. 07/16/23  Yes Newlin, Enobong, MD  Homeopathic Products (EARACHE DROPS OT) Place 1 drop into both ears daily as needed (earache).   Yes [provider]  isosorbide  mononitrate (IMDUR ) 60 MG 24 hr tablet Take 1 tablet (60 mg total) by mouth daily. 07/16/23  Yes Newlin, Enobong, MD  lidocaine -prilocaine  (EMLA ) cream Apply a small amount to dialysis access one hour prior to dialysis. Wrap with plastic wrap. 12/10/21  Yes   loratadine  (CLARITIN ) 10 MG tablet Take 1 tablet (10 mg total) by mouth daily. 05/08/23  Yes Newlin, Enobong, MD  midodrine  (PROAMATINE ) 10 MG tablet Take 1 tablet (10 mg total) by mouth 3 (three) times a week. Take 30 minutes prior to HD Patient taking differently: Take 10 mg by mouth See admin instructions. Take 1 tablet by mouth prior to dialysis if blood pressure is low. 07/17/23  Yes   olopatadine  (PATANOL) 0.1 % ophthalmic solution Place 1 drop into both eyes 2 (two) times daily. 08/14/22  Yes Brien Belvie BRAVO, MD  Throat Lozenges (HALLS COUGH DROPS MT) Use as directed 1 each in the mouth or throat daily  as needed (cough).   Yes [provider]  glucose blood (ACCU-CHEK GUIDE) test strip USE AS INSTRUCTED 2 TIMES DAILY 04/16/23   Danton Jon HERO, PA-C  Misc. Devices [employer]  Diagnosis - unstable gait 03/05/21   Delbert Clam, MD  Misc. Devices MISC Fistula sleeve for R arm.  Diagnoses: End-stage renal disease on hemodialysis 01/09/22   Delbert Clam, MD  Misc. Devices MISC 1. Incontinence supplies.  2. Briefs.  Diagnosis-urinary incontinence 04/17/22   Delbert Clam, MD  Misc. Devices MISC [place] dialysis protein drink.  Diagnosis end-stage renal disease 05/16/22   Delbert Clam, MD  Misc. Devices MISC Blood pressure  monitor.  Diagnosis hypertension 05/20/22   Delbert Clam, MD    Physical Exam: Vitals:   08/16/23 1945 08/16/23 1958 08/17/23 0022 08/17/23 0300  BP: (!) 112/53 (!) 112/53 (!) 114/51 139/67  Pulse:  76    Resp: (!) 21 20 (!) 34 (!) 22  Temp:  98.9 F (37.2 C)    TempSrc:  Oral    SpO2:  100%     Constitutional: NAD, calm, comfortable Respiratory: clear to auscultation bilaterally, no wheezing, no crackles. Normal respiratory effort. No accessory muscle use.  Cardiovascular: Regular rate and rhythm, no murmurs / rubs / gallops. No extremity edema. 2+ pedal pulses. No carotid bruits.  Abdomen: no tenderness, no masses palpated. No hepatosplenomegaly. Bowel sounds positive.  Neurologic: CN 2-12 grossly intact. Sensation intact, DTR normal. Strength 5/5 in all 4.  Psychiatric: Normal judgment and insight. Alert and oriented x 3. Normal mood.   Data Reviewed:    Labs on Admission: I have personally reviewed following labs and imaging studies  CBC: Recent Labs  Lab 08/16/23 1745 08/16/23 1809  WBC 7.0  --   NEUTROABS 5.2  --   HGB 8.3* 9.2*  HCT 25.4* 27.0*  MCV 93.0  --   PLT 183  --    Basic Metabolic Panel: Recent Labs  Lab 08/16/23 1745 08/16/23 1809  NA 133* 131*  K 3.8 3.7  CL 88*  --   CO2 26  --   GLUCOSE 295*  --   BUN 75*  --    CREATININE 9.61*  --   CALCIUM  8.8*  --    GFR: CrCl cannot be calculated (Unknown ideal weight.). Liver Function Tests: Recent Labs  Lab 08/16/23 1745  AST 33  ALT 27  ALKPHOS 73  BILITOT 0.9  PROT 6.8  ALBUMIN 3.2*   No results for input(s): LIPASE, AMYLASE in the last 168 hours. No results for input(s): AMMONIA in the last 168 hours. Coagulation Profile: No results for input(s): INR, PROTIME in the last 168 hours. Cardiac Enzymes: No results for input(s): CKTOTAL, CKMB, CKMBINDEX, TROPONINI in the last 168 hours. BNP (last 3 results) No results for input(s): PROBNP in the last 8760 hours. HbA1C: No results for input(s): HGBA1C in the last 72 hours. CBG: Recent Labs  Lab 08/16/23 1822  GLUCAP 250*   Lipid Profile: No results for input(s): CHOL, HDL, LDLCALC, TRIG, CHOLHDL, LDLDIRECT in the last 72 hours. Thyroid  Function Tests: No results for input(s): TSH, T4TOTAL, FREET4, T3FREE, THYROIDAB in the last 72 hours. Anemia Panel: No results for input(s): VITAMINB12, FOLATE, FERRITIN, TIBC, IRON, RETICCTPCT in the last 72 hours. Urine analysis:    Component Value Date/Time   COLORURINE STRAW (A) 11/13/2021 0921   APPEARANCEUR CLEAR 11/13/2021 0921   LABSPEC 1.006 11/13/2021 0921   PHURINE 7.0 11/13/2021 0921   GLUCOSEU NEGATIVE 11/13/2021 0921   HGBUR NEGATIVE 11/13/2021 0921   BILIRUBINUR NEGATIVE 11/13/2021 0921   BILIRUBINUR negative 09/02/2018 1128   KETONESUR NEGATIVE 11/13/2021 0921   PROTEINUR 100 (A) 11/13/2021 0921   UROBILINOGEN 0.2 09/02/2018 1128   UROBILINOGEN 0.2 05/12/2018 1259   NITRITE NEGATIVE 11/13/2021 0921   LEUKOCYTESUR TRACE (A) 11/13/2021 0921    Radiological Exams on Admission: DG Chest Portable 1 View Result Date: 08/16/2023 CLINICAL DATA:  Shortness of breath, wheezing and rhonchi. EXAM: PORTABLE CHEST 1 VIEW COMPARISON:  02/02/2023 FINDINGS: Stable cardiomediastinal contours.  Identified bilaterally. Aortic atherosclerosis. Pulmonary vascular congestion. Opacity within the left lower lung obscures the left heart border and  may reflect an area of early pneumonia. No opacities identified within the right lung. No signs of pleural effusion or frank interstitial edema. Peribronchial cuffing IMPRESSION: 1. Left lower lung opacity obscures the left heart border and may reflect an area of early pneumonia. 2. Peribronchial cuffing compatible with central airway inflammation. 3. Pulmonary vascular congestion Electronically Signed   By: Waddell Calk M.D.   On: 08/16/2023 17:50    EKG: Independently reviewed.   Assessment and Plan: * Acute respiratory failure with hypoxia (HCC) Pt with new O2 requirement: volume overload vs PNA (or both) O2 via [place] Cont pulse ox Treating both PNA and volume overload as below  Volume overload Pulm vasc congestion, BNP of 2400 and trops of 100 and flat are suspicious of volume overload in this ESRD patient. D/w Dr. Dennise: planning on getting on dialysis first thing in AM to treat possible volume overload Tele monitor 2D echo ordered  PNA (pneumonia) Possible PNA demonstrated on CXR: WBC nl Procalcitonin elevated Covid, flu, rsv neg Cont rocephin  + azithromycin  Repeat CBC in AM  Paroxysmal A-fib (HCC) Cont eliquis  Cont coreg   Essential hypertension Pt on coreg , imdur , norvasc ... and midodrine  prn? Going to continue coreg  for a.fib rate control Holding imdur , norvasc , and midodrine  for the moment till we see where her BP is.  Type 2 diabetes mellitus without complication, without long-term current use of insulin  (HCC) Very sensitive SSI AC/HS      Advance Care Planning:   Code Status: Full Code   Consults: d/w Dr. Dennise  Family Communication: Family at bedside  Severity of Illness: The appropriate patient status for this patient is OBSERVATION. Observation status is judged to be reasonable and necessary in order to  provide the required intensity of service to ensure the patient's safety. The patient's presenting symptoms, physical exam findings, and initial radiographic and laboratory data in the context of their medical condition is felt to place them at decreased risk for further clinical deterioration. Furthermore, it is anticipated that the patient will be medically stable for discharge from the hospital within 2 midnights of admission.   Author: Demari Gales M., DO 08/17/2023 3:35 AM  For on call review [email or web address].

## 2023-08-17 NOTE — Progress Notes (Signed)
 PROGRESS NOTE    Paula [name]   FMW:[id number] DOB: [birth year] DOA: 08/16/2023 PCP: Delbert Clam, MD  Outpatient Specialists:     Brief Narrative:  Patient is a 78 year old female, with past medical history significant for ESRD on hemodialysis MWF, asthma, hypertension, hyperlipidemia and type 2 diabetes mellitus.  Patient was admitted earlier today with shortness of breath.  Patient has not missed hemodialysis session.  No worsening edema.  Patient is wheezing.  As documented above, patient has history of asthma.  08/17/2023: Patient seen alongside patient's daughter.  For hemodialysis today.  Clinical syndrome may also be suggestive of acute asthma exacerbation.  Will start patient on IV Solu-Medrol , Singulair , nebulizer treatment.  Agree with hemodialysis.   Assessment & Plan:   Principal Problem:   Acute respiratory failure with hypoxia (HCC) Active Problems:   Type 2 diabetes mellitus without complication, without long-term current use of insulin  (HCC)   Essential hypertension   Paroxysmal A-fib (HCC)   PNA (pneumonia)   Volume overload   Acute respiratory failure with hypoxia (HCC): -Suspect acute asthma exacerbation. -Patient also has end-stage renal disease on hemodialysis.  However, no missed hemodialysis session.  No worsening of edema reported, however, chest x-ray revealed pulmonary edema. -No constitutional symptoms of pneumonia.  However, patient is on treatment for pneumonia. -Start patient on IV Solu-Medrol , nebs DuoNeb, Singulair , peak flow monitoring daily.  Will manage acute asthma. -Further management will depend on hospital course.   Volume overload: Pulm vasc congestion, BNP of 2400 and trops of 100 and flat are suspicious of volume overload in this ESRD patient. -For hemodialysis today.   -Full echocardiogram.   Possible pneumonia:  WBC nl Procalcitonin elevated however, patient has ESRD. Covid, flu, rsv neg Continue Rocephin  and azithromycin .      Paroxysmal A-fib (HCC) Cont eliquis  Cont coreg    Essential hypertension Controlled.   Type 2 diabetes mellitus without complication, without long-term current use of insulin  (HCC) Very sensitive SSI AC/HS     DVT prophylaxis: Start patient on subcutaneous heparin . Code Status: Full code. Family Communication: Daughter. Disposition Plan: Home in the next 2 days.   Consultants:  Nephrology.  Procedures:  For hemodialysis.  Antimicrobials:  IV Rocephin . Azithromycin    Subjective: Shortness of breath and wheezing. No fever or chills. No productive cough.  Objective: Vitals:   08/17/23 1115 08/17/23 1130 08/17/23 1200 08/17/23 1220  BP: (!) 144/61 (!) 121/91 (!) 150/75   Pulse: 86 63 86   Resp: 17 (!) 23 (!) 22   Temp:    97.7 F (36.5 C)  TempSrc:    Oral  SpO2: 92% 91% 94%   Weight:        Intake/Output Summary (Last 24 hours) at 08/17/2023 1221 Last data filed at 08/16/2023 2238 Gross per 24 hour  Intake 352.31 ml  Output --  Net 352.31 ml   Filed Weights   08/17/23 0300  Weight: 74.1 kg    Examination:  General exam: Not in obvious distress.  Awake and alert.   Respiratory system: Decreased air entry and wheezing.   Cardiovascular system: S1 & S2 heard. Gastrointestinal system: Obese, soft and nontender.   Central nervous system: Alert and oriented. No focal neurological deficits. Extremities: No leg edema.  Data Reviewed: I have personally reviewed following labs and imaging studies  CBC: Recent Labs  Lab 08/16/23 1745 08/16/23 1809 08/17/23 0806  WBC 7.0  --  7.2  NEUTROABS 5.2  --   --   HGB 8.3* 9.2* 7.9*  HCT 25.4* 27.0* 24.7*  MCV 93.0  --  91.8  PLT 183  --  181   Basic Metabolic Panel: Recent Labs  Lab 08/16/23 1745 08/16/23 1809 08/17/23 0806  NA 133* 131* 137  136  K 3.8 3.7 3.6  3.5  CL 88*  --  92*  90*  CO2 26  --  27  27  GLUCOSE 295*  --  97  99  BUN 75*  --  85*  85*  CREATININE 9.61*  --  10.87*   10.87*  CALCIUM  8.8*  --  8.2*  8.2*  PHOS  --   --  5.8*   GFR: Estimated Creatinine Clearance: 4.2 mL/min (A) (by C-G formula based on SCr of 10.87 mg/dL (H)). Liver Function Tests: Recent Labs  Lab 08/16/23 1745 08/17/23 0806  AST 33  --   ALT 27  --   ALKPHOS 73  --   BILITOT 0.9  --   PROT 6.8  --   ALBUMIN 3.2* 3.1*   No results for input(s): LIPASE, AMYLASE in the last 168 hours. No results for input(s): AMMONIA in the last 168 hours. Coagulation Profile: No results for input(s): INR, PROTIME in the last 168 hours. Cardiac Enzymes: No results for input(s): CKTOTAL, CKMB, CKMBINDEX, TROPONINI in the last 168 hours. BNP (last 3 results) No results for input(s): PROBNP in the last 8760 hours. HbA1C: No results for input(s): HGBA1C in the last 72 hours. CBG: Recent Labs  Lab 08/16/23 1822 08/17/23 0801 08/17/23 1149  GLUCAP 250* 97 130*   Lipid Profile: No results for input(s): CHOL, HDL, LDLCALC, TRIG, CHOLHDL, LDLDIRECT in the last 72 hours. Thyroid  Function Tests: No results for input(s): TSH, T4TOTAL, FREET4, T3FREE, THYROIDAB in the last 72 hours. Anemia Panel: No results for input(s): VITAMINB12, FOLATE, FERRITIN, TIBC, IRON, RETICCTPCT in the last 72 hours. Urine analysis:    Component Value Date/Time   COLORURINE STRAW (A) 11/13/2021 0921   APPEARANCEUR CLEAR 11/13/2021 0921   LABSPEC 1.006 11/13/2021 0921   PHURINE 7.0 11/13/2021 0921   GLUCOSEU NEGATIVE 11/13/2021 0921   HGBUR NEGATIVE 11/13/2021 0921   BILIRUBINUR NEGATIVE 11/13/2021 0921   BILIRUBINUR negative 09/02/2018 1128   KETONESUR NEGATIVE 11/13/2021 0921   PROTEINUR 100 (A) 11/13/2021 0921   UROBILINOGEN 0.2 09/02/2018 1128   UROBILINOGEN 0.2 05/12/2018 1259   NITRITE NEGATIVE 11/13/2021 0921   LEUKOCYTESUR TRACE (A) 11/13/2021 0921   Sepsis Labs: @LABRCNTIP (procalcitonin:4,lacticidven:4)  ) Recent Results (from the past 240  hours)  Resp panel by RT-PCR (RSV, Flu A&B, Covid) Anterior Nasal Swab     Status: None   Collection Time: 08/16/23  6:37 PM   Specimen: Anterior Nasal Swab  Result Value Ref Range Status   SARS Coronavirus 2 by RT PCR NEGATIVE NEGATIVE Final   Influenza A by PCR NEGATIVE NEGATIVE Final   Influenza B by PCR NEGATIVE NEGATIVE Final    Comment: (NOTE) The Xpert Xpress SARS-CoV-2/FLU/RSV plus assay is intended as an aid in the diagnosis of influenza from Nasopharyngeal swab specimens and should not be used as a sole basis for treatment. Nasal washings and aspirates are unacceptable for Xpert Xpress SARS-CoV-2/FLU/RSV testing.  Fact Sheet for Patients: bloggercourse.com  Fact Sheet for Healthcare Providers: seriousbroker.it  This test is not yet approved or cleared by the [place]  FDA and has been authorized for detection and/or diagnosis of SARS-CoV-2 by FDA under an Emergency Use Authorization (EUA). This EUA will remain in effect (meaning this test can be  used) for the duration of the COVID-19 declaration under Section 564(b)(1) of the Act, 21 U.S.C. section 360bbb-3(b)(1), unless the authorization is terminated or revoked.     Resp Syncytial Virus by PCR NEGATIVE NEGATIVE Final    Comment: (NOTE) Fact Sheet for Patients: bloggercourse.com  Fact Sheet for Healthcare Providers: seriousbroker.it  This test is not yet approved or cleared by the [place]  FDA and has been authorized for detection and/or diagnosis of SARS-CoV-2 by FDA under an Emergency Use Authorization (EUA). This EUA will remain in effect (meaning this test can be used) for the duration of the COVID-19 declaration under Section 564(b)(1) of the Act, 21 U.S.C. section 360bbb-3(b)(1), unless the authorization is terminated or revoked.  Performed at [facility], [address] [place],  [place] [address]          Radiology Studies: ECHOCARDIOGRAM COMPLETE Result Date: 08/17/2023    ECHOCARDIOGRAM REPORT   Patient Name:   Paula [name]  Date of Exam: 08/17/2023 Medical Rec #:  [id number]           Height:       63.0 in Accession #:    [id number]          Weight:       163.4 lb Date of Birth:  January 15, 1946           BSA:          1.774 m Patient Age:    77 years            BP:           117/65 mmHg Patient Gender: F                   HR:           84 bpm. Exam Location:  Inpatient Procedure: 2D Echo, Cardiac Doppler, Color Doppler and Strain Analysis Indications:    CHF  History:        Patient has prior history of Echocardiogram examinations, most                 recent 10/16/2021. CHF, Arrythmias:Atrial Fibrillation; Risk                 Factors:Hypertension and Diabetes.  Sonographer:    Lanell Maduro Referring Phys: [id number] JARED M GARDNER IMPRESSIONS  1. Left ventricular ejection fraction, by estimation, is 60 to 65%. Left ventricular ejection fraction by 2D MOD biplane is 62.1 %. The left ventricle has normal function. The left ventricle has no regional wall motion abnormalities. There is severe concentric left ventricular hypertrophy. Left ventricular diastolic function could not be evaluated.  2. Right ventricular systolic function is low normal. The right ventricular size is normal. Tricuspid regurgitation signal is inadequate for assessing PA pressure.  3. Left atrial size was moderately dilated.  4. The mitral valve is abnormal. Trivial mitral valve regurgitation.  5. The aortic valve is tricuspid. Aortic valve regurgitation is trivial. Aortic valve sclerosis/calcification is present, without any evidence of aortic stenosis.  6. The inferior vena cava is normal in size with greater than 50% respiratory variability, suggesting right atrial pressure of 3 mmHg. Comparison(s): No prior Echocardiogram. FINDINGS  Left Ventricle: Left ventricular ejection fraction, by estimation, is 60 to 65%.  Left ventricular ejection fraction by 2D MOD biplane is 62.1 %. The left ventricle has normal function. The left ventricle has no regional wall motion abnormalities. The left ventricular internal cavity size was normal in size. There  is severe concentric left ventricular hypertrophy. Left ventricular diastolic function could not be evaluated due to atrial fibrillation. Left ventricular diastolic function could not be evaluated. Right Ventricle: The right ventricular size is normal. No increase in right ventricular wall thickness. Right ventricular systolic function is low normal. Tricuspid regurgitation signal is inadequate for assessing PA pressure. Left Atrium: Left atrial size was moderately dilated. Right Atrium: Right atrial size was normal in size. Pericardium: There is no evidence of pericardial effusion. Mitral Valve: The mitral valve is abnormal. There is mild calcification of the anterior and posterior mitral valve leaflet(s). Trivial mitral valve regurgitation. Tricuspid Valve: The tricuspid valve is grossly normal. Tricuspid valve regurgitation is trivial. Aortic Valve: The aortic valve is tricuspid. Aortic valve regurgitation is trivial. Aortic valve sclerosis/calcification is present, without any evidence of aortic stenosis. Pulmonic Valve: The pulmonic valve was grossly normal. Pulmonic valve regurgitation is trivial. Aorta: The aortic root and ascending aorta are structurally normal, with no evidence of dilitation. Venous: The inferior vena cava is normal in size with greater than 50% respiratory variability, suggesting right atrial pressure of 3 mmHg. IAS/Shunts: No atrial level shunt detected by color flow Doppler.  LEFT VENTRICLE PLAX 2D                        Biplane EF (MOD) LVIDd:         3.10 cm         LV Biplane EF:   Left LVIDs:         2.30 cm                          ventricular LV PW:         1.80 cm                          ejection LV IVS:        2.00 cm                           fraction by LVOT diam:     2.10 cm                          2D MOD LV SV:         75                               biplane is LV SV Index:   42                               62.1 %. LVOT Area:     3.46 cm                                Diastology                                LV e' medial:    6.31 cm/s LV Volumes (MOD)               LV E/e' medial:  23.3 LV vol d, MOD    41.4 ml  LV e' lateral:   8.27 cm/s A2C:                           LV E/e' lateral: 17.8 LV vol d, MOD    41.8 ml A4C: LV vol s, MOD    14.8 ml A2C: LV vol s, MOD    16.2 ml A4C: LV SV MOD A2C:   26.6 ml LV SV MOD A4C:   41.8 ml LV SV MOD BP:    26.9 ml RIGHT VENTRICLE             IVC RV Basal diam:  3.30 cm     IVC diam: 1.70 cm RV S prime:     10.90 cm/s TAPSE (M-mode): 1.9 cm LEFT ATRIUM             Index        RIGHT ATRIUM           Index LA diam:        3.90 cm 2.20 cm/m   RA Area:     15.90 cm LA Vol (A2C):   86.5 ml 48.75 ml/m  RA Volume:   35.60 ml  20.06 ml/m LA Vol (A4C):   63.0 ml 35.51 ml/m LA Biplane Vol: 76.8 ml 43.28 ml/m  AORTIC VALVE LVOT Vmax:   108.00 cm/s LVOT Vmean:  81.200 cm/s LVOT VTI:    0.216 m  AORTA Ao Root diam: 2.70 cm Ao Asc diam:  3.20 cm MITRAL VALVE MV Area (PHT): 4.80 cm     SHUNTS MV Decel Time: 158 msec     Systemic VTI:  0.22 m MV E velocity: 147.00 cm/s  Systemic Diam: 2.10 cm Vinie Maxcy MD Electronically signed by Vinie Maxcy MD Signature Date/Time: 08/17/2023/11:20:28 AM    Final    DG Chest Portable 1 View Result Date: 08/16/2023 CLINICAL DATA:  Shortness of breath, wheezing and rhonchi. EXAM: PORTABLE CHEST 1 VIEW COMPARISON:  02/02/2023 FINDINGS: Stable cardiomediastinal contours. Identified bilaterally. Aortic atherosclerosis. Pulmonary vascular congestion. Opacity within the left lower lung obscures the left heart border and may reflect an area of early pneumonia. No opacities identified within the right lung. No signs of pleural effusion or frank interstitial edema. Peribronchial  cuffing IMPRESSION: 1. Left lower lung opacity obscures the left heart border and may reflect an area of early pneumonia. 2. Peribronchial cuffing compatible with central airway inflammation. 3. Pulmonary vascular congestion Electronically Signed   By: Waddell Calk M.D.   On: 08/16/2023 17:50        Scheduled Meds:  apixaban   5 mg Oral BID   atorvastatin   80 mg Oral Daily   azithromycin   500 mg Oral Daily   budesonide  (PULMICORT ) nebulizer solution  0.25 mg Nebulization BID   calcium  acetate  667 mg Oral TID WC   carvedilol   12.5 mg Oral BID WC   Chlorhexidine  Gluconate Cloth  6 each Topical Q0600   fluticasone   2 spray Each Nare Daily   gabapentin   100 mg Oral Daily   insulin  aspart  0-5 Units Subcutaneous QHS   insulin  aspart  0-6 Units Subcutaneous TID WC   ipratropium-albuterol   3 mL Nebulization Q6H   methylPREDNISolone  (SOLU-MEDROL ) injection  40 mg Intravenous Q12H   midodrine   10 mg Oral TID WC   montelukast   10 mg Oral QHS   olopatadine   1 drop Both Eyes BID   Continuous Infusions:  anticoagulant sodium citrate   cefTRIAXone  (ROCEPHIN )  IV       LOS: 0 days    Time spent: 35 minutes.    Leatrice Chapel, MD  [facility] Pager #: [phone] 7PM-7AM contact night coverage as above

## 2023-08-17 NOTE — Procedures (Signed)
 I was present at the procedure, reviewed the HD regimen and made appropriate changes.   Vinson Moselle MD  [facility] 08/17/2023, 4:56 PM

## 2023-08-17 NOTE — Progress Notes (Signed)
Pt receives out-pt HD at [facility] [place] on MWF. Will assist as needed.   Olivia Canter Renal Navigator [phone]

## 2023-08-17 NOTE — Assessment & Plan Note (Addendum)
 Pulm vasc congestion, BNP of 2400 and trops of 100 and flat are suspicious of volume overload in this ESRD patient. D/w Dr. Thedore Mins: planning on getting on dialysis first thing in AM to treat possible volume overload Tele monitor 2D echo ordered

## 2023-08-17 NOTE — Telephone Encounter (Signed)
 Reach out to daughter Jodi Geralds in regards to request for a referral for PT for her mother. No answer left message to return call if appointment is still needed.

## 2023-08-17 NOTE — Consult Note (Signed)
 Renal Service Consult Note  [facility]  Nur Rabold Dubeau  08/17/2023 Lamar JONETTA Fret, MD Requesting Physician: Dr. Rosario  Reason for Consult: ESRD pt w/ SOB HPI: The patient is a 78 y.o. year-old w/ PMH of asthma, DM2, diast CHF, ESRD on HD, HTN, HL who presented to ED yesterday c/o SOB. In ED had new O2 requirement, not on home O2. Last HD Friday, had not missed HD. Has had some subjective fevers/ and chills. SBP's 110-140 in ED. Pt admitted and started on IV abx for poss CAP. Asked to see for ESRD.    Pt seen in room. C/o dry mouth. On 5 L [place] O2. Not on home O2. No abd pain or chest pain.   ROS - denies CP, no joint pain, no HA, no blurry vision, no rash, no diarrhea, no nausea/ vomiting, no dysuria, no difficulty voiding   Past Medical History  Past Medical History:  Diagnosis Date   Allergic rhinitis    Arthritis    Asthma    Brain aneurysm    Cough    Diabetes mellitus    Diastolic CHF, chronic (HCC) 10/11/2011   ESRD on hemodialysis (HCC)    MWF   GERD (gastroesophageal reflux disease)    History of colon polyps 2012   tubular adenoma    Hyperlipidemia    Hypertension    Neuropathy 10/11/2011   PONV (postoperative nausea and vomiting)    one time after lymph node surgery   Past Surgical History  Past Surgical History:  Procedure Laterality Date   AV FISTULA PLACEMENT Right 10/15/2021   Procedure: ARTERIOVENOUS (AV) FISTULA  WITH PLACEMENT OF GORE-TEX STRETCH  GRAFT (4-78mmx45cm);  Surgeon: Eliza Lonni RAMAN, MD;  Location: [facility] OR;  Service: Vascular;  Laterality: Right;   CATARACT EXTRACTION     right eye   IR 3D INDEPENDENT WKST  12/14/2017   IR ANGIO INTRA EXTRACRAN SEL COM CAROTID INNOMINATE BILAT MOD SED  09/15/2017   IR ANGIO INTRA EXTRACRAN SEL COM CAROTID INNOMINATE UNI L MOD SED  12/14/2017   IR ANGIO VERTEBRAL SEL VERTEBRAL BILAT MOD SED  09/15/2017   IR RADIOLOGIST EVAL & MGMT  09/10/2017   IR RADIOLOGIST EVAL & MGMT  10/19/2017    lymphatic mass surgery     NASAL TURBINATE REDUCTION     RADIOLOGY WITH ANESTHESIA N/A 12/14/2017   Procedure: RADIOLOGY WITH ANESTHESIA EMBOLIZATION;  Surgeon: Dolphus Carrion, MD;  Location: [facility] OR;  Service: Radiology;  Laterality: N/A;   VIDEO BRONCHOSCOPY Bilateral 11/18/2017   Procedure: VIDEO BRONCHOSCOPY WITHOUT FLUORO;  Surgeon: Shelah Lamar RAMAN, MD;  Location: [facility] END[facility]COPY;  Service: Cardiopulmonary;  Laterality: Bilateral;   Family History  Family History  Problem Relation Age of Onset   Hypertension Mother    Asthma Sister    Colon cancer Neg Hx    Allergic rhinitis Neg Hx    Eczema Neg Hx    Social History  reports that she has never smoked. She has never been exposed to tobacco smoke. She has never used smokeless tobacco. She reports that she does not drink alcohol  and does not use drugs. Allergies  Allergies  Allergen Reactions   Dust Mite Extract Cough   Peanut-Containing Drug Products Cough   Ace Inhibitors Cough   Lovastatin Other (See Comments)    Generalized body pain   Home medications Prior to Admission medications   Medication Sig Start Date End Date Taking? Authorizing Provider  acetaminophen  (TYLENOL ) 500 MG tablet Take 500 mg by  mouth every 6 (six) hours as needed for fever, moderate pain or mild pain.   Yes [provider]  albuterol  (PROAIR  HFA) 108 (90 Base) MCG/ACT inhaler Inhale 1-2 puffs into the lungs every 6 (six) hours as needed for wheezing or shortness of breath. 04/16/23  Yes Danton Jon HERO, PA-C  amLODipine  (NORVASC ) 5 MG tablet Take 1 tablet (5 mg total) by mouth daily. 04/16/23  Yes Danton Jon HERO, PA-C  apixaban  (ELIQUIS ) 5 MG TABS tablet Take 1 tablet (5 mg total) by mouth 2 (two) times daily. 04/02/23  Yes Newlin, Enobong, MD  atorvastatin  (LIPITOR ) 80 MG tablet Take 1 tablet (80 mg total) by mouth daily. 07/16/23  Yes Newlin, Enobong, MD  azelastine  (ASTELIN ) 0.1 % nasal spray Place 2 sprays into both nostrils 2 (two) times  daily. Use in each nostril as directed 01/08/23  Yes Delbert Clam, MD  calcium  acetate (PHOSLO ) 667 MG capsule Take 1 capsule (667 mg total) by mouth 3 (three) times daily with meals. 07/17/23  Yes   carvedilol  (COREG ) 12.5 MG tablet Take 1 tablet (12.5 mg total) by mouth 2 (two) times daily with a meal. (Take once a day in the evening on hemodialysis days) 04/16/23  Yes McClung, Jon HERO, PA-C  fluticasone  (FLONASE ) 50 MCG/ACT nasal spray Place 2 sprays into both nostrils daily. 01/08/23  Yes Newlin, Enobong, MD  gabapentin  (NEURONTIN ) 100 MG capsule Take 1 capsule (100 mg total) by mouth 3 (three) times daily. 11/25/22  Yes Newlin, Enobong, MD  glipiZIDE  (GLUCOTROL  XL) 2.5 MG 24 hr tablet Take 1 tablet (2.5 mg total) by mouth daily with breakfast. (needs office visit) Patient taking differently: Take 2.5 mg by mouth See admin instructions. Take 2.5mg  by mouth once a day. If blood sugar is high, take an additional tablet on the same day. 07/16/23  Yes Newlin, Enobong, MD  Homeopathic Products (EARACHE DROPS OT) Place 1 drop into both ears daily as needed (earache).   Yes [provider]  isosorbide  mononitrate (IMDUR ) 60 MG 24 hr tablet Take 1 tablet (60 mg total) by mouth daily. 07/16/23  Yes Newlin, Enobong, MD  lidocaine -prilocaine  (EMLA ) cream Apply a small amount to dialysis access one hour prior to dialysis. Wrap with plastic wrap. 12/10/21  Yes   loratadine  (CLARITIN ) 10 MG tablet Take 1 tablet (10 mg total) by mouth daily. 05/08/23  Yes Newlin, Enobong, MD  midodrine  (PROAMATINE ) 10 MG tablet Take 1 tablet (10 mg total) by mouth 3 (three) times a week. Take 30 minutes prior to HD Patient taking differently: Take 10 mg by mouth See admin instructions. Take 1 tablet by mouth prior to dialysis if blood pressure is low. 07/17/23  Yes   olopatadine  (PATANOL) 0.1 % ophthalmic solution Place 1 drop into both eyes 2 (two) times daily. 08/14/22  Yes Brien Belvie BRAVO, MD  Throat Lozenges (HALLS COUGH  DROPS MT) Use as directed 1 each in the mouth or throat daily as needed (cough).   Yes [provider]  glucose blood (ACCU-CHEK GUIDE) test strip USE AS INSTRUCTED 2 TIMES DAILY 04/16/23   Danton Jon HERO, PA-C  Misc. Devices [employer]  Diagnosis - unstable gait 03/05/21   Delbert Clam, MD  Misc. Devices MISC Fistula sleeve for R arm.  Diagnoses: End-stage renal disease on hemodialysis 01/09/22   Delbert Clam, MD  Misc. Devices MISC 1. Incontinence supplies.  2. Briefs.  Diagnosis-urinary incontinence 04/17/22   Delbert Clam, MD  Misc. Devices MISC Nepro dialysis protein drink.  Diagnosis end-stage renal disease 05/16/22   Delbert Clam, MD  Misc. Devices MISC Blood pressure monitor.  Diagnosis hypertension 05/20/22   Delbert Clam, MD     Vitals:   08/17/23 1115 08/17/23 1130 08/17/23 1200 08/17/23 1220  BP: (!) 144/61 (!) 121/91 (!) 150/75   Pulse: 86 63 86   Resp: 17 (!) 23 (!) 22   Temp:    97.7 F (36.5 C)  TempSrc:    Oral  SpO2: 92% 91% 94%   Weight:       Exam Gen alert, no distress No rash, cyanosis or gangrene Sclera anicteric, throat clear  No jvd or bruits Chest scattered mild bibasilar rales, no wheezing RRR no MRG Abd soft ntnd no mass or ascites +bs GU defer MS no joint effusions or deformity Ext no LE or UE edema, no wounds or ulcers Neuro is alert, Ox 3 , nf    AVG+ bruit     Renal-related home meds: - norvasc  5, coreg  12.5 bid, imdur  60 - gabapentin  100 every day - eliquis  5 bid - phoslo  1 ac tid    OP HD: [facility]  3h  2/2 bath  73.4kg   P3   400/800  AVG   Hep none - mircera 150 q2, last 12/29 - venofer 50 weekly - hectorol 2 mcg three times per week   Na 136  K 35.  BUN 85  creat 10   Ca 8.2  alb 3.1  Hb 7.9  WBC 7K    CXR +vasc congestion  Assessment/ Plan: Acute hypoxic resp failure - CAD and/or vol overload. Pt on 5 L [place] O2 in ED. High BNP 2400, CXR +congestion. Plan HD today w/ max UF as tolerated.  CAP -  possible, on IV abx per pmd ESKD - on HD MWF. Last HD Friday. HD as above.  HTN - hold home coreg , norvasc  and imdur . BP's low normal.   Volume - possible early edema by CXR. UF 2.5- 3 L w/ HD as tol Anemia of eskd - Hb 8-10 here. Next esa due in 1 week. Follow.  Secondary hyperparathyroidism - CCa and phos are in range. Cont binders w/ meals       Rob Jetta Murray  MD [facility] 08/17/2023, 12:35 PM  Recent Labs  Lab 08/16/23 1745 08/16/23 1809 08/17/23 0806  HGB 8.3* 9.2* 7.9*  ALBUMIN 3.2*  --  3.1*  CALCIUM  8.8*  --  8.2*  8.2*  PHOS  --   --  5.8*  CREATININE 9.61*  --  10.87*  10.87*  K 3.8 3.7 3.6  3.5   Inpatient medications:  apixaban   5 mg Oral BID   atorvastatin   80 mg Oral Daily   azithromycin   500 mg Oral Daily   budesonide  (PULMICORT ) nebulizer solution  0.25 mg Nebulization BID   calcium  acetate  667 mg Oral TID WC   carvedilol   12.5 mg Oral BID WC   Chlorhexidine  Gluconate Cloth  6 each Topical Q0600   fluticasone   2 spray Each Nare Daily   gabapentin   100 mg Oral Daily   insulin  aspart  0-5 Units Subcutaneous QHS   insulin  aspart  0-6 Units Subcutaneous TID WC   ipratropium-albuterol   3 mL Nebulization Q6H   methylPREDNISolone  (SOLU-MEDROL ) injection  40 mg Intravenous Q12H   midodrine   10 mg Oral TID WC   montelukast   10 mg Oral QHS   olopatadine   1 drop Both Eyes BID    anticoagulant sodium  citrate     cefTRIAXone  (ROCEPHIN )  IV     acetaminophen  **OR** acetaminophen , acetaminophen , albuterol , alteplase , anticoagulant sodium citrate , heparin , lidocaine  (PF), lidocaine -prilocaine , ondansetron  **OR** ondansetron  (ZOFRAN ) IV, pentafluoroprop-tetrafluoroeth

## 2023-08-17 NOTE — Plan of Care (Signed)
   Problem: Clinical Measurements: Goal: Cardiovascular complication will be avoided Outcome: Progressing

## 2023-08-17 NOTE — Assessment & Plan Note (Addendum)
 Pt on coreg, imdur, norvasc... and midodrine prn? Going to continue coreg for a.fib rate control Holding imdur, norvasc, and midodrine for the moment till we see where her BP is.

## 2023-08-17 NOTE — Assessment & Plan Note (Signed)
 Possible PNA demonstrated on CXR: WBC nl Procalcitonin elevated Covid, flu, rsv neg Cont rocephin + azithromycin Repeat CBC in AM

## 2023-08-17 NOTE — Progress Notes (Signed)
 Informed of ESRD patient in the ER. Presents with SOB. PNA +/- pulm vasc congestion.  Outpatient orders: [facility]. MWF. 3hrs . AVG. Flow rates 400/800. 2k, 2cal. Uf profile 3. EDW 73.4kg. Heparin : none. Meds: Mircera 150mcg every 2 weeks (last dose 12/29), Venofer 50mg  qweekly. Hectorol 2mcg every treatment.  Plan: -plan for HD in AM. May need to readjust edw post d/c (recently increased as an outpatient) Full consult to follow in AM Please call with any questions/concerns in the interim.  Ephriam Stank, MD [facility]

## 2023-08-17 NOTE — Assessment & Plan Note (Signed)
 Cont eliquis Cont coreg

## 2023-08-18 ENCOUNTER — Ambulatory Visit: Payer: Medicare HMO | Attending: Student | Admitting: Student

## 2023-08-18 DIAGNOSIS — J9601 Acute respiratory failure with hypoxia: Secondary | ICD-10-CM | POA: Diagnosis not present

## 2023-08-18 LAB — GLUCOSE, CAPILLARY
Glucose-Capillary: 406 mg/dL — ABNORMAL HIGH (ref 70–99)
Glucose-Capillary: 247 mg/dL — ABNORMAL HIGH (ref 70–99)
Glucose-Capillary: 134 mg/dL — ABNORMAL HIGH (ref 70–99)
Glucose-Capillary: 121 mg/dL — ABNORMAL HIGH (ref 70–99)
Glucose-Capillary: 214 mg/dL — ABNORMAL HIGH (ref 70–99)

## 2023-08-18 LAB — HEMOGLOBIN A1C
Hgb A1c MFr Bld: 7.4 % — ABNORMAL HIGH (ref 4.8–5.6)
Mean Plasma Glucose: 166 mg/dL

## 2023-08-18 LAB — HEPATITIS B SURFACE ANTIBODY, QUANTITATIVE: Hep B S AB Quant (Post): 1746 m[IU]/mL

## 2023-08-18 MED ORDER — LINAGLIPTIN 5 MG PO TABS
5.0000 mg | ORAL_TABLET | Freq: Every day | ORAL | Status: DC
  Administered 2023-08-18 – 2023-08-20 (×3): 5 mg via ORAL
  Filled 2023-08-18 (×3): qty 1

## 2023-08-18 MED ORDER — ARFORMOTEROL TARTRATE 15 MCG/2ML IN NEBU
15.0000 ug | INHALATION_SOLUTION | Freq: Two times a day (BID) | RESPIRATORY_TRACT | Status: DC
  Administered 2023-08-18 – 2023-08-20 (×4): 15 ug via RESPIRATORY_TRACT
  Filled 2023-08-18 (×4): qty 2

## 2023-08-18 MED ORDER — BUDESONIDE 0.5 MG/2ML IN SUSP
0.5000 mg | Freq: Two times a day (BID) | RESPIRATORY_TRACT | Status: DC
  Administered 2023-08-18 – 2023-08-20 (×4): 0.5 mg via RESPIRATORY_TRACT
  Filled 2023-08-18 (×4): qty 2

## 2023-08-18 MED ORDER — PREDNISONE 20 MG PO TABS
40.0000 mg | ORAL_TABLET | Freq: Every day | ORAL | Status: DC
  Administered 2023-08-20: 40 mg via ORAL
  Filled 2023-08-18 (×2): qty 2

## 2023-08-18 MED ORDER — CHLORHEXIDINE GLUCONATE CLOTH 2 % EX PADS
6.0000 | MEDICATED_PAD | Freq: Every day | CUTANEOUS | Status: DC
Start: 2023-08-19 — End: 2023-08-20
  Administered 2023-08-19: 6 via TOPICAL

## 2023-08-18 NOTE — Consult Note (Signed)
 [facility] Liaison Consult Note   08/18/2023  Paula [name]  1946-07-18 [id number]  Insurance: [employer] SNP  Primary Care Provider:  Delbert Clam, MD, [facility]  Review of patient's medical record for past medical history and membership affiliate roster reveals this Patient is found to be in a [employer] SNP [Special Needs Program] plan. This plan provides care management for the member.   For additional questions or referrals please contact:    Plan: Will sign off.   Richerd Fish, RN, BSN, CCM [employer], [facility] Liaison Direct Dial: [phone] or secure chat Email: [email or web address]

## 2023-08-18 NOTE — Progress Notes (Addendum)
 PROGRESS NOTE    Paula [name]   FMW:[id number] DOB: May 02, 1946 DOA: 08/16/2023 PCP: Delbert Clam, MD    Brief Narrative:   Paula [name]  is a 78 y.o. female with past medical history significant for ESRD on HD MWF, asthma, HTN, HLD, type 2 diabetes mellitus, paroxysmal atrial fibrillation who presented to [facility] ED on 1/6 from home via EMS with progressive shortness of breath.  Patient reports wheezing, denies any missed hemodialysis sessions and denies any progressive lower extremity edema.  Denies fever/chills.  On EMS arrival, patient was noted be hypoxic and placed on supplemental oxygen.  Transported to the ED for further evaluation management.  In the ED, temperature 98.1 F, HR 69, RR 22, BP 113/57, SpO2 100% on 3 L nasal count.  WBC 7.0, hemoglobin 8.3, platelet count 183.  Sodium 133, potassium 3.8, chloride 88, CO2 26, glucose 295, BUN 75, creatinine 9.61.  AST 33, ALT 27, total bilirubin 0.9.  High sensitivity troponin 111>98.  BNP 2362.  Procalcitonin 0.61.  COVID/RSV/influenza PCR negative.  Chest x-ray with left lower lung opacity concerning for pneumonia, parabronchial cuffing compatible central airway inflammation, pulmonary vascular congestion.  Assessment & Plan:    Acute hypoxic respiratory failure Patient presenting with progressive shortness of breath.  Noted to be hypoxic on EMS arrival and placed on supplemental oxygen.  Etiology likely multifactorial in the setting of volume overload from CHF exacerbation as well as asthma exacerbation and community-acquired pneumonia. -- Continue supplemental oxygen, maintain SpO2 greater than 92%, currently now titrated off -- Treatment as below  Asthma exacerbation -- Solu-Medrol  40 mg IV every 12 hours, transition to prednisone  tomorrow -- Pulmicort  neb twice daily -- Brovana  neb BID -- Singulair  10 mg p.o. nightly  Community-acquired pneumonia Patient is afebrile, WBC count within normal limits.  Procalcitonin  slightly elevated 0.61.  Chest x-ray with left lower lobe opacity concerning for pneumonia. -- Azithromycin  500 mg p.o. daily; plan 5-day course -- Ceftriaxone  1 g IV every 24 hours, plan 5-day course  Acute diastolic congestive heart failure TTE with LVEF 60 to 65%, LV with no regional wall motion abnormalities, LV diastolic function not evaluated, LA moderately dilated, IVC normal in size. -- Volume management with HD -- Strict I's and O's and daily weights  Hyponatremia: Resolved Sodium 133 on admission, likely secondary to hypervolemic hyponatremia in the setting of volume overload. -- Na 133>137 -- Renal function panel daily  Paroxysmal atrial fibrillation Essential hypertension -- Carvedilol  12.5 mg p.o. twice daily -- Eliquis  5 mg p.o. twice daily -- Holding home amlodipine  and isosorbide  mononitrate -- Continue monitor BP closely  Type 2 diabetes mellitus, with hyperglycemia Hemoglobin A1c 7.4 on 08/17/2023. Home regimen includes glipizide  2.5 mg p.o. daily. -- Refusing insulin  administration -- Tradjenta  5 mg p.o. daily  Hyperlipidemia -- Atorvastatin  80 mg p.o. daily  ESRD on HD MWF -- Nephrology following for continue HD while inpatient, removed 3.5 L UF 1/6 -- Next HD 1/8 -- Midodrine  10 mg p.o. MWF with HD  Weakness/ability/deconditioning: -- PT/OT evaluation: Pending  DVT prophylaxis:  apixaban  (ELIQUIS ) tablet 5 mg    Code Status: Full Code Family Communication: Updated patient's daughter present at bedside this morning  Disposition Plan:  Level of care: Telemetry Cardiac Status is: Inpatient Remains inpatient appropriate because: Continues with wheezing, IV steroids, needs further HD, anticipate discharge home in 1-2 days    Consultants:  Nephrology  Procedures:  None  Antimicrobials:  Azithromycin  1/5>> Ceftriaxone  1/5>>   Subjective: Patient seen examined bedside,  lying in bed.  Daughter present at bedside.  Continues with wheezing.  Dyspnea  improved, titrated off of supplemental oxygen.  Received HD yesterday with 3.5 L UF out.  Plan next HD tomorrow.  Requesting to be seen by therapy.  No other specific questions, concerns or complaints at this time.  Denies headache, no dizziness, no chest pain, no palpitations, no fever/chills/night sweats, no nausea/vomiting/diarrhea, no focal weakness, no fatigue, no paresthesia.  Patient refused insulin  administration, was seen overnight by coverage physician; no other acute events overnight per nursing staff.  Objective: Vitals:   08/18/23 0740 08/18/23 0816 08/18/23 1350 08/18/23 1356  BP:  (!) 138/49  (!) 110/48  Pulse: 72 75 68   Resp:  20 20   Temp:      TempSrc:      SpO2:  96%    Weight:      Height:        Intake/Output Summary (Last 24 hours) at 08/18/2023 1451 Last data filed at 08/17/2023 1900 Gross per 24 hour  Intake --  Output 3500 ml  Net -3500 ml   Filed Weights   08/17/23 1300 08/17/23 1427 08/18/23 0553  Weight: 74.9 kg 72.8 kg 69.1 kg    Examination:  Physical Exam: GEN: NAD, alert and oriented x 3, ill in appearance HEENT: NCAT, PERRL, EOMI, sclera clear, MMM PULM: Breath sounds bilateral bases, + crackles/wheezing, normal respiratory effort without accessory muscle use, on room air with SpO2 100% at rest CV: IIR, normal rate w/o M/G/R GI: abd soft, NTND, NABS, no R/G/M MSK: no peripheral edema, muscle strength globally intact 5/5 bilateral upper/lower extremities NEURO: CN II-XII intact, no focal deficits, sensation to light touch intact PSYCH: normal mood/affect Integumentary: dry/intact, no rashes or wounds    Data Reviewed: I have personally reviewed following labs and imaging studies  CBC: Recent Labs  Lab 08/16/23 1745 08/16/23 1809 08/17/23 0806  WBC 7.0  --  7.2  NEUTROABS 5.2  --   --   HGB 8.3* 9.2* 7.9*  HCT 25.4* 27.0* 24.7*  MCV 93.0  --  91.8  PLT 183  --  181   Basic Metabolic Panel: Recent Labs  Lab 08/16/23 1745  08/16/23 1809 08/17/23 0806  NA 133* 131* 137  136  K 3.8 3.7 3.6  3.5  CL 88*  --  92*  90*  CO2 26  --  27  27  GLUCOSE 295*  --  97  99  BUN 75*  --  85*  85*  CREATININE 9.61*  --  10.87*  10.87*  CALCIUM  8.8*  --  8.2*  8.2*  PHOS  --   --  5.8*   GFR: Estimated Creatinine Clearance: 4.1 mL/min (A) (by C-G formula based on SCr of 10.87 mg/dL (H)). Liver Function Tests: Recent Labs  Lab 08/16/23 1745 08/17/23 0806  AST 33  --   ALT 27  --   ALKPHOS 73  --   BILITOT 0.9  --   PROT 6.8  --   ALBUMIN 3.2* 3.1*   No results for input(s): LIPASE, AMYLASE in the last 168 hours. No results for input(s): AMMONIA in the last 168 hours. Coagulation Profile: No results for input(s): INR, PROTIME in the last 168 hours. Cardiac Enzymes: No results for input(s): CKTOTAL, CKMB, CKMBINDEX, TROPONINI in the last 168 hours. BNP (last 3 results) No results for input(s): PROBNP in the last 8760 hours. HbA1C: Recent Labs    08/17/23 0806  HGBA1C 7.4*  CBG: Recent Labs  Lab 08/17/23 1901 08/17/23 2123 08/18/23 0136 08/18/23 0538 08/18/23 1114  GLUCAP 166* 400* 406* 247* 134*   Lipid Profile: No results for input(s): CHOL, HDL, LDLCALC, TRIG, CHOLHDL, LDLDIRECT in the last 72 hours. Thyroid  Function Tests: No results for input(s): TSH, T4TOTAL, FREET4, T3FREE, THYROIDAB in the last 72 hours. Anemia Panel: No results for input(s): VITAMINB12, FOLATE, FERRITIN, TIBC, IRON, RETICCTPCT in the last 72 hours. Sepsis Labs: Recent Labs  Lab 08/16/23 2048  PROCALCITON 0.61    Recent Results (from the past 240 hours)  Resp panel by RT-PCR (RSV, Flu A&B, Covid) Anterior Nasal Swab     Status: None   Collection Time: 08/16/23  6:37 PM   Specimen: Anterior Nasal Swab  Result Value Ref Range Status   SARS Coronavirus 2 by RT PCR NEGATIVE NEGATIVE Final   Influenza A by PCR NEGATIVE NEGATIVE Final   Influenza B by PCR  NEGATIVE NEGATIVE Final    Comment: (NOTE) The Xpert Xpress SARS-CoV-2/FLU/RSV plus assay is intended as an aid in the diagnosis of influenza from Nasopharyngeal swab specimens and should not be used as a sole basis for treatment. Nasal washings and aspirates are unacceptable for Xpert Xpress SARS-CoV-2/FLU/RSV testing.  Fact Sheet for Patients: bloggercourse.com  Fact Sheet for Healthcare Providers: seriousbroker.it  This test is not yet approved or cleared by the [place]  FDA and has been authorized for detection and/or diagnosis of SARS-CoV-2 by FDA under an Emergency Use Authorization (EUA). This EUA will remain in effect (meaning this test can be used) for the duration of the COVID-19 declaration under Section 564(b)(1) of the Act, 21 U.S.C. section 360bbb-3(b)(1), unless the authorization is terminated or revoked.     Resp Syncytial Virus by PCR NEGATIVE NEGATIVE Final    Comment: (NOTE) Fact Sheet for Patients: bloggercourse.com  Fact Sheet for Healthcare Providers: seriousbroker.it  This test is not yet approved or cleared by the [place]  FDA and has been authorized for detection and/or diagnosis of SARS-CoV-2 by FDA under an Emergency Use Authorization (EUA). This EUA will remain in effect (meaning this test can be used) for the duration of the COVID-19 declaration under Section 564(b)(1) of the Act, 21 U.S.C. section 360bbb-3(b)(1), unless the authorization is terminated or revoked.  Performed at [facility], [address], [place], [place] [address]          Radiology Studies: ECHOCARDIOGRAM COMPLETE Result Date: 08/17/2023    ECHOCARDIOGRAM REPORT   Patient Name:   Vaishali A Tew  Date of Exam: 08/17/2023 Medical Rec #:  [id number]           Height:       63.0 in Accession #:    [id number]          Weight:       163.4 lb Date of Birth:   09/03/1945           BSA:          1.774 m Patient Age:    77 years            BP:           117/65 mmHg Patient Gender: F                   HR:           84 bpm. Exam Location:  Inpatient Procedure: 2D Echo, Cardiac Doppler, Color Doppler and Strain Analysis Indications:    CHF  History:  Patient has prior history of Echocardiogram examinations, most                 recent 10/16/2021. CHF, Arrythmias:Atrial Fibrillation; Risk                 Factors:Hypertension and Diabetes.  Sonographer:    Lanell Maduro Referring Phys: [id number] JARED M GARDNER IMPRESSIONS  1. Left ventricular ejection fraction, by estimation, is 60 to 65%. Left ventricular ejection fraction by 2D MOD biplane is 62.1 %. The left ventricle has normal function. The left ventricle has no regional wall motion abnormalities. There is severe concentric left ventricular hypertrophy. Left ventricular diastolic function could not be evaluated.  2. Right ventricular systolic function is low normal. The right ventricular size is normal. Tricuspid regurgitation signal is inadequate for assessing PA pressure.  3. Left atrial size was moderately dilated.  4. The mitral valve is abnormal. Trivial mitral valve regurgitation.  5. The aortic valve is tricuspid. Aortic valve regurgitation is trivial. Aortic valve sclerosis/calcification is present, without any evidence of aortic stenosis.  6. The inferior vena cava is normal in size with greater than 50% respiratory variability, suggesting right atrial pressure of 3 mmHg. Comparison(s): No prior Echocardiogram. FINDINGS  Left Ventricle: Left ventricular ejection fraction, by estimation, is 60 to 65%. Left ventricular ejection fraction by 2D MOD biplane is 62.1 %. The left ventricle has normal function. The left ventricle has no regional wall motion abnormalities. The left ventricular internal cavity size was normal in size. There is severe concentric left ventricular hypertrophy. Left ventricular diastolic  function could not be evaluated due to atrial fibrillation. Left ventricular diastolic function could not be evaluated. Right Ventricle: The right ventricular size is normal. No increase in right ventricular wall thickness. Right ventricular systolic function is low normal. Tricuspid regurgitation signal is inadequate for assessing PA pressure. Left Atrium: Left atrial size was moderately dilated. Right Atrium: Right atrial size was normal in size. Pericardium: There is no evidence of pericardial effusion. Mitral Valve: The mitral valve is abnormal. There is mild calcification of the anterior and posterior mitral valve leaflet(s). Trivial mitral valve regurgitation. Tricuspid Valve: The tricuspid valve is grossly normal. Tricuspid valve regurgitation is trivial. Aortic Valve: The aortic valve is tricuspid. Aortic valve regurgitation is trivial. Aortic valve sclerosis/calcification is present, without any evidence of aortic stenosis. Pulmonic Valve: The pulmonic valve was grossly normal. Pulmonic valve regurgitation is trivial. Aorta: The aortic root and ascending aorta are structurally normal, with no evidence of dilitation. Venous: The inferior vena cava is normal in size with greater than 50% respiratory variability, suggesting right atrial pressure of 3 mmHg. IAS/Shunts: No atrial level shunt detected by color flow Doppler.  LEFT VENTRICLE PLAX 2D                        Biplane EF (MOD) LVIDd:         3.10 cm         LV Biplane EF:   Left LVIDs:         2.30 cm                          ventricular LV PW:         1.80 cm                          ejection LV IVS:  2.00 cm                          fraction by LVOT diam:     2.10 cm                          2D MOD LV SV:         75                               biplane is LV SV Index:   42                               62.1 %. LVOT Area:     3.46 cm                                Diastology                                LV e' medial:    6.31 cm/s LV Volumes  (MOD)               LV E/e' medial:  23.3 LV vol d, MOD    41.4 ml       LV e' lateral:   8.27 cm/s A2C:                           LV E/e' lateral: 17.8 LV vol d, MOD    41.8 ml A4C: LV vol s, MOD    14.8 ml A2C: LV vol s, MOD    16.2 ml A4C: LV SV MOD A2C:   26.6 ml LV SV MOD A4C:   41.8 ml LV SV MOD BP:    26.9 ml RIGHT VENTRICLE             IVC RV Basal diam:  3.30 cm     IVC diam: 1.70 cm RV S prime:     10.90 cm/s TAPSE (M-mode): 1.9 cm LEFT ATRIUM             Index        RIGHT ATRIUM           Index LA diam:        3.90 cm 2.20 cm/m   RA Area:     15.90 cm LA Vol (A2C):   86.5 ml 48.75 ml/m  RA Volume:   35.60 ml  20.06 ml/m LA Vol (A4C):   63.0 ml 35.51 ml/m LA Biplane Vol: 76.8 ml 43.28 ml/m  AORTIC VALVE LVOT Vmax:   108.00 cm/s LVOT Vmean:  81.200 cm/s LVOT VTI:    0.216 m  AORTA Ao Root diam: 2.70 cm Ao Asc diam:  3.20 cm MITRAL VALVE MV Area (PHT): 4.80 cm     SHUNTS MV Decel Time: 158 msec     Systemic VTI:  0.22 m MV E velocity: 147.00 cm/s  Systemic Diam: 2.10 cm Vinie Maxcy MD Electronically signed by Vinie Maxcy MD Signature Date/Time: 08/17/2023/11:20:28 AM    Final    DG Chest Portable 1 View Result Date: 08/16/2023 CLINICAL DATA:  Shortness of breath, wheezing and rhonchi. EXAM: PORTABLE CHEST  1 VIEW COMPARISON:  02/02/2023 FINDINGS: Stable cardiomediastinal contours. Identified bilaterally. Aortic atherosclerosis. Pulmonary vascular congestion. Opacity within the left lower lung obscures the left heart border and may reflect an area of early pneumonia. No opacities identified within the right lung. No signs of pleural effusion or frank interstitial edema. Peribronchial cuffing IMPRESSION: 1. Left lower lung opacity obscures the left heart border and may reflect an area of early pneumonia. 2. Peribronchial cuffing compatible with central airway inflammation. 3. Pulmonary vascular congestion Electronically Signed   By: Waddell Calk M.D.   On: 08/16/2023 17:50        Scheduled  Meds:  apixaban   5 mg Oral BID   atorvastatin   80 mg Oral Daily   azithromycin   500 mg Oral Daily   budesonide  (PULMICORT ) nebulizer solution  0.25 mg Nebulization BID   calcium  acetate  667 mg Oral TID WC   carvedilol   12.5 mg Oral BID WC   Chlorhexidine  Gluconate Cloth  6 each Topical Q0600   fluticasone   2 spray Each Nare Daily   gabapentin   100 mg Oral Daily   ipratropium-albuterol   3 mL Nebulization Q6H   linagliptin   5 mg Oral Daily   methylPREDNISolone  (SOLU-MEDROL ) injection  40 mg Intravenous Q12H   [START ON 08/19/2023] midodrine   10 mg Oral Q M,W,F-HD   montelukast   10 mg Oral QHS   olopatadine   1 drop Both Eyes BID   Continuous Infusions:  cefTRIAXone  (ROCEPHIN )  IV 1 g (08/17/23 1909)     LOS: 1 day    Time spent: 52 minutes spent on chart review, discussion with nursing staff, consultants, updating family and interview/physical exam; more than 50% of that time was spent in counseling and/or coordination of care.    Camellia PARAS Anureet Bruington, DO [facility] Available via Epic secure chat 7am-7pm After these hours, please refer to coverage provider listed on [email or web address] 08/18/2023, 2:51 PM

## 2023-08-18 NOTE — Progress Notes (Signed)
 Mobility Specialist Progress Note:  Nurse requested Mobility Specialist to perform oxygen saturation test with pt which includes removing pt from oxygen both at rest and while ambulating.  Below are the results from that testing.     Patient Saturations on Room Air at Rest = spO2 98%  Patient Saturations on Room Air while Ambulating = sp02 92% .  Rested and performed pursed lip breathing for 1 minute with sp02 at 95%.  At end of testing pt left in room on 0  Liters of oxygen.  Reported results to nurse.    Thersia Minder Mobility Specialist  Please contact vis Secure Chat or  Rehab Office [phone]

## 2023-08-18 NOTE — Progress Notes (Signed)
 [facility] Progress Note  Subjective: seen in room, no c/o's today  Vitals:   08/18/23 0740 08/18/23 0816 08/18/23 1350 08/18/23 1356  BP:  (!) 138/49  (!) 110/48  Pulse: 72 75 68   Resp:  20 20   Temp:      TempSrc:      SpO2:  96%    Weight:      Height:        Exam: Gen alert, no distress No rash, cyanosis or gangrene Sclera anicteric, throat clear  No jvd or bruits Chest scattered mild bibasilar rales, no wheezing RRR no MRG Abd soft ntnd no mass or ascites +bs GU defer MS no joint effusions or deformity Ext no LE or UE edema, no wounds or ulcers Neuro is alert, Ox 3 , nf    AVG+ bruit       Renal-related home meds: - norvasc  5, coreg  12.5 bid, imdur  60 - gabapentin  100 every day - eliquis  5 bid - phoslo  1 ac tid      OP HD: SW MWF  3h  2/2 bath  73.4kg   P3   400/800  AVG   Hep none - mircera 150 q2, last 12/29 - venofer 50 weekly - hectorol 2 mcg three times per week    Na 136  K 35.  BUN 85  creat 10   Ca 8.2  alb 3.1  Hb 7.9  WBC 7K    CXR +vasc congestion   Assessment/ Plan: Acute hypoxic resp failure - CAD and/or vol overload. Pt on 5 L [place] O2 in ED. High BNP 2400, CXR +congestion. Had HD last night w/ 3.5 L off.  CAP - possible, on IV abx per pmd ESKD - on HD MWF. Last HD Friday. HD 1/06 here. Next HD tomorrow.  HTN - hold home coreg , norvasc  and imdur . BP's low normal.   Volume - early edema on initial CXR. Under dry wt now, cont to lower vol w/ HD tomorrow.  Anemia of eskd - Hb 8-10 here. Next esa due in 1 week. Follow.  Secondary hyperparathyroidism - CCa and phos are in range. Cont binders w/ meals     Myer Fret MD  [facility] 08/18/2023, 4:27 PM  Recent Labs  Lab 08/16/23 1745 08/16/23 1809 08/17/23 0806  HGB 8.3* 9.2* 7.9*  ALBUMIN 3.2*  --  3.1*  CALCIUM  8.8*  --  8.2*  8.2*  PHOS  --   --  5.8*  CREATININE 9.61*  --  10.87*  10.87*  K 3.8 3.7 3.6  3.5   No results for input(s): IRON, TIBC, FERRITIN in  the last 168 hours. Inpatient medications:  apixaban   5 mg Oral BID   arformoterol   15 mcg Nebulization BID   atorvastatin   80 mg Oral Daily   azithromycin   500 mg Oral Daily   budesonide  (PULMICORT ) nebulizer solution  0.5 mg Nebulization BID   calcium  acetate  667 mg Oral TID WC   carvedilol   12.5 mg Oral BID WC   Chlorhexidine  Gluconate Cloth  6 each Topical Q0600   fluticasone   2 spray Each Nare Daily   gabapentin   100 mg Oral Daily   ipratropium-albuterol   3 mL Nebulization Q6H   linagliptin   5 mg Oral Daily   methylPREDNISolone  (SOLU-MEDROL ) injection  40 mg Intravenous Q12H   [START ON 08/19/2023] midodrine   10 mg Oral Q M,W,F-HD   montelukast   10 mg Oral QHS   olopatadine   1 drop  Both Eyes BID   [START ON 08/19/2023] predniSONE   40 mg Oral Q breakfast    cefTRIAXone  (ROCEPHIN )  IV 1 g (08/17/23 1909)   acetaminophen  **OR** acetaminophen , acetaminophen , albuterol , ondansetron  **OR** ondansetron  (ZOFRAN ) IV

## 2023-08-18 NOTE — Plan of Care (Signed)

## 2023-08-18 NOTE — Significant Event (Signed)
 Patient's nurse notified me that patient and patient's daughter is refusing insulin  since patient had a life-threatening reaction previously.  I went and discussed with patient and patient's daughter.  They said they do not want to be on insulin  due to the previous reaction.  They are not able to explain the exact reaction.  I reviewed with pharmacist of any previous reaction.  I had extensive discussion with patient and patient's daughter about the adverse effects of having oral hypoglycemics while inpatient.  Patient and family are insistent on not being on insulin  and on oral medications.  Patient takes glipizide  2.5 mg XL daily at home.  Last hemoglobin A1c was 7.4 yesterday.  After discussing with pharmacy plan is to keep patient on Tradjenta  5 mg p.o. daily while in the hospital.  Patient and family agrees with this plan.  It has been discontinued.  Will continue to check CBGs ACHS.  Redia Cleaver.

## 2023-08-18 NOTE — TOC Initial Note (Addendum)
 Transition of Care [facility]) - Initial/Assessment Note    Patient Details  Name: Paula [name]  MRN: [id number] Date of Birth: 1945-11-27  Transition of Care [facility]) CM/SW Contact:    Waddell Barnie Rama, RN Phone Number: 08/18/2023, 12:21 PM  Clinical Narrative:                 Per Daughter at bedside, From home with daughter, has PCP and insurance on file, states has no HH services in place at this time, has walker, and shower chair at home.  States family member will transport them home at [employer] and family is support system, states gets medications from [facility] on [address].  Pta self ambulatory . Per daughter she would like for patient to have [facility] services with [facility].  Expected Discharge Plan: Home w Home Health Services Barriers to Discharge: Continued Medical Work up   Patient Goals and CMS Choice Patient states their goals for this hospitalization and ongoing recovery are:: get better   Choice offered to / list presented to : NA      Expected Discharge Plan and Services In-house Referral: NA Discharge Planning Services: CM Consult Post Acute Care Choice: NA Living arrangements for the past 2 months: Single Family Home                 DME Arranged: N/A DME Agency: NA       HH Arranged: NA          Prior Living Arrangements/Services Living arrangements for the past 2 months: Single Family Home Lives with:: Adult Children (daughter) Patient language and need for interpreter reviewed:: Yes Do you feel safe going back to the place where you live?: Yes      Need for Family Participation in Patient Care: Yes (Comment) Care giver support system in place?: Yes (comment) Current home services: DME (walker , showe chair) Criminal Activity/Legal Involvement Pertinent to Current Situation/Hospitalization: No - Comment as needed  Activities of Daily Living   ADL Screening (condition at time of admission) Independently performs ADLs?: Yes (appropriate for developmental  age) Is the patient deaf or have difficulty hearing?: Yes Does the patient have difficulty seeing, even when wearing glasses/contacts?: No Does the patient have difficulty concentrating, remembering, or making decisions?: Yes  Permission Sought/Granted                  Emotional Assessment Appearance:: Appears stated age     Orientation: : Oriented to Self, Oriented to Place, Oriented to  Time, Oriented to Situation Alcohol  / Substance Use: Not Applicable Psych Involvement: No (comment)  Admission diagnosis:  Elevated troponin [R79.89] Acute respiratory failure with hypoxia (HCC) [J96.01] Pneumonia of left lower lobe due to infectious organism [J18.9] Acute hypoxic respiratory failure (HCC) [J96.01] Patient Active Problem List   Diagnosis Date Noted   Acute respiratory failure with hypoxia (HCC) 08/17/2023   PNA (pneumonia) 08/17/2023   Volume overload 08/17/2023   Acute hypoxic respiratory failure (HCC) 08/17/2023   SNHL (sensory-neural hearing loss), 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   ESRD on dialysis (HCC) 11/20/2022   Mold exposure 08/14/2022   Opioid withdrawal (HCC) 08/14/2022   ILD (interstitial lung disease) (HCC) 08/14/2022   Secondary hyperparathyroidism, renal (HCC) 08/14/2022   Paroxysmal A-fib (HCC) 05/20/2022   Hypertrophy of nasal turbinates 09/05/2021   Chronic kidney disease, stage 5 (HCC)    Aortic atherosclerosis (HCC) 06/24/2021   Chronic rhinitis 10/01/2017   Seasonal allergic conjunctivitis 10/01/2017   Aneurysm (HCC) 09/24/2017   Acute metabolic encephalopathy 09/02/2017  Allergic rhinitis 08/20/2017   Palpitations 07/10/2016   Gout 01/23/2016   History of cataract 02/27/2015   Seasonal and perennial allergic rhinitis 02/15/2012   Chronic diastolic CHF (congestive heart failure) (HCC) 10/11/2011   GERD (gastroesophageal reflux disease)    Type 2 diabetes mellitus without complication, without long-term current use of insulin  (HCC) 07/03/2010    Hyperlipidemia 07/03/2010   Essential hypertension 07/03/2010   PCP:  Delbert Clam, MD Pharmacy:   [facility] [address] [place] [address] Phone: [phone] Fax: [phone]  [facility] [address] [place] [address] Phone: [phone] Fax: [phone]  [facility] [address], [place] - [address] [place] [address] Phone: [phone] Fax: [phone]  [facility] - [facility] [address] [place] [address] Phone: [phone] Fax: [phone]     Social Drivers of Health (SDOH) Social History: SDOH Screenings   Food Insecurity: Food Insecurity Present (08/17/2023)  Housing: High Risk (08/17/2023)  Transportation Needs: Unmet Transportation Needs (08/17/2023)  Utilities: At Risk (08/17/2023)  Alcohol  Screen: Low Risk  (11/20/2022)  Depression (PHQ2-9): Low Risk  (11/20/2022)  Financial Resource Strain: Medium Risk (11/20/2022)  Physical Activity: Insufficiently Active (11/20/2022)  Social Connections: Moderately Isolated (08/17/2023)  Stress: No Stress Concern Present (11/20/2022)  Tobacco Use: Low Risk  (08/16/2023)   SDOH Interventions: Food Insecurity Interventions: Inpatient TOC, Community Resources Provided Housing Interventions: Inpatient TOC, Community Resources Provided Transportation Interventions: [employer] Provided, Inpatient TOC Utilities Interventions: [employer] Provided, Inpatient TOC   Readmission Risk Interventions    08/18/2023   12:18 PM 09/05/2021   12:31 PM  Readmission Risk Prevention Plan  Transportation Screening Complete Complete  PCP or Specialist Appt within 5-7 Days  Complete  Home Care Screening  Complete  Medication Review (RN CM)  Complete  Medication Review (RN Care Manager) Complete   PCP or Specialist appointment within 3-5 days of  discharge Complete   HRI or Home Care Consult Complete   Palliative Care Screening Not Applicable   Skilled Nursing Facility Not Applicable

## 2023-08-18 NOTE — Progress Notes (Signed)
 Mobility Specialist Progress Note:    08/18/23 1220  Mobility  Activity Ambulated with assistance in hallway  Level of Assistance Contact guard assist, steadying assist  Assistive Device Front wheel walker  Distance Ambulated (ft) 100 ft  Activity Response Tolerated well  Mobility Referral Yes  Mobility visit 1 Mobility  Mobility Specialist Start Time (ACUTE ONLY) 1200  Mobility Specialist Stop Time (ACUTE ONLY) 1215  Mobility Specialist Time Calculation (min) (ACUTE ONLY) 15 min   Pt received in bed hesitant but agreeable to mobility. Required no physical assistance during session. Ambulated on RA throughout ambulation Spo2 stayed between 92% and 98%. Pt took many standing breaks d/t tiredness and fatigue. Left seated EOB w/ call bell and personal belongings in reach. All needs met. Daughter in room.  Thersia Minder Mobility Specialist  Please contact vis Secure Chat or  Rehab Office [phone]

## 2023-08-18 NOTE — TOC Initial Note (Signed)
 Transition of Care [facility]) - Inpatient Brief Assessment   Patient Details  Name: Paula [name]  MRN: [id number] Date of Birth: [birth year]  [facility]) CM/SW Contact:    Luise JAYSON Pan, LCSWA Phone Number: 08/18/2023, 8:46 AM   Clinical Narrative: CSW gave resources for SDOH needs to pts family member in room. Hard copy placed on pts chart.    Transition of Care Asessment: Insurance and Status: Insurance coverage has been reviewed Patient has primary care physician: Yes Home environment has been reviewed: From home Prior level of function:: independent Prior/Current Home Services: No current home services Social Drivers of Health Review: SDOH reviewed interventions complete Readmission risk has been reviewed: Yes Transition of care needs: no transition of care needs at this time

## 2023-08-19 DIAGNOSIS — J9601 Acute respiratory failure with hypoxia: Secondary | ICD-10-CM | POA: Diagnosis not present

## 2023-08-19 LAB — CBC
HCT: 27.6 % — ABNORMAL LOW (ref 36.0–46.0)
Hemoglobin: 9.3 g/dL — ABNORMAL LOW (ref 12.0–15.0)
MCH: 30.8 pg (ref 26.0–34.0)
MCHC: 33.7 g/dL (ref 30.0–36.0)
MCV: 91.4 fL (ref 80.0–100.0)
Platelets: 221 10*3/uL (ref 150–400)
RBC: 3.02 MIL/uL — ABNORMAL LOW (ref 3.87–5.11)
RDW: 19.2 % — ABNORMAL HIGH (ref 11.5–15.5)
WBC: 9.8 10*3/uL (ref 4.0–10.5)
nRBC: 0 % (ref 0.0–0.2)

## 2023-08-19 LAB — GLUCOSE, CAPILLARY
Glucose-Capillary: 265 mg/dL — ABNORMAL HIGH (ref 70–99)
Glucose-Capillary: 487 mg/dL — ABNORMAL HIGH (ref 70–99)
Glucose-Capillary: 146 mg/dL — ABNORMAL HIGH (ref 70–99)
Glucose-Capillary: 213 mg/dL — ABNORMAL HIGH (ref 70–99)

## 2023-08-19 LAB — RENAL FUNCTION PANEL
Albumin: 3.4 g/dL — ABNORMAL LOW (ref 3.5–5.0)
Anion gap: 18 — ABNORMAL HIGH (ref 5–15)
BUN: 86 mg/dL — ABNORMAL HIGH (ref 8–23)
CO2: 26 mmol/L (ref 22–32)
Calcium: 8.5 mg/dL — ABNORMAL LOW (ref 8.9–10.3)
Chloride: 89 mmol/L — ABNORMAL LOW (ref 98–111)
Creatinine, Ser: 10.39 mg/dL — ABNORMAL HIGH (ref 0.44–1.00)
GFR, Estimated: 4 mL/min — ABNORMAL LOW (ref >60)
Glucose, Bld: 229 mg/dL — ABNORMAL HIGH (ref 70–99)
Phosphorus: 5.9 mg/dL — ABNORMAL HIGH (ref 2.5–4.6)
Potassium: 4.9 mmol/L (ref 3.5–5.1)
Sodium: 133 mmol/L — ABNORMAL LOW (ref 135–145)

## 2023-08-19 MED ORDER — LIDOCAINE HCL (PF) 1 % IJ SOLN: 5.0000 mL | INTRAMUSCULAR | Status: DC | PRN

## 2023-08-19 MED ORDER — HEPARIN SODIUM (PORCINE) 1000 UNIT/ML DIALYSIS: 1000.0000 [IU] | INTRAMUSCULAR | Status: DC | PRN

## 2023-08-19 MED ORDER — ALTEPLASE 2 MG IJ SOLR: 2.0000 mg | Freq: Once | INTRAMUSCULAR | Status: DC | PRN

## 2023-08-19 MED ORDER — NEPRO/CARBSTEADY PO LIQD: 237.0000 mL | ORAL | Status: DC | PRN

## 2023-08-19 MED ORDER — PENTAFLUOROPROP-TETRAFLUOROETH EX AERO: 1.0000 | INHALATION_SPRAY | CUTANEOUS | Status: DC | PRN

## 2023-08-19 MED ORDER — LIDOCAINE-PRILOCAINE 2.5-2.5 % EX CREA: 1.0000 | TOPICAL_CREAM | CUTANEOUS | Status: DC | PRN

## 2023-08-19 MED ORDER — GLIPIZIDE ER 2.5 MG PO TB24
2.5000 mg | ORAL_TABLET | Freq: Every day | ORAL | Status: DC
  Administered 2023-08-19 – 2023-08-20 (×2): 2.5 mg via ORAL
  Filled 2023-08-19 (×2): qty 1

## 2023-08-19 MED ORDER — DEXTROMETHORPHAN POLISTIREX ER 30 MG/5ML PO SUER
15.0000 mg | Freq: Two times a day (BID) | ORAL | Status: DC | PRN
  Administered 2023-08-19: 15 mg via ORAL
  Filled 2023-08-19 (×2): qty 5

## 2023-08-19 MED ORDER — ANTICOAGULANT SODIUM CITRATE 4% (200MG/5ML) IV SOLN: 5.0000 mL | Status: DC | PRN

## 2023-08-19 NOTE — Progress Notes (Signed)
 [facility] Progress Note  Subjective: seen in room. No /co's.   Vitals:   08/19/23 1530 08/19/23 1600 08/19/23 1627 08/19/23 1630  BP: (!) 118/49 (!) 106/92 (!) 88/51 (!) 107/50  Pulse: 69 68 77 71  Resp: 15 12 (!) 24 15  Temp:      TempSrc:      SpO2: 96% 99% 100% 100%  Weight:      Height:        Exam: Gen alert, no distress No rash, cyanosis or gangrene Sclera anicteric, throat clear  No jvd or bruits Chest scattered mild bibasilar rales, no wheezing RRR no MRG Abd soft ntnd no mass or ascites +bs GU defer MS no joint effusions or deformity Ext no LE or UE edema, no wounds or ulcers Neuro is alert, Ox 3 , nf    AVG+ bruit       Renal-related home meds: - norvasc  5, coreg  12.5 bid, imdur  60 - gabapentin  100 every day - eliquis  5 bid - phoslo  1 ac tid      OP HD: SW MWF  3h  2/2 bath  73.4kg   P3   400/800  AVG   Hep none - mircera 150 q2, last 12/29 - venofer 50 weekly - hectorol 2 mcg three times per week    Na 136  K 35.  BUN 85  creat 10   Ca 8.2  alb 3.1  Hb 7.9  WBC 7K    CXR +vasc congestion   Assessment/ Plan: Acute hypoxic resp failure - CAD and/or vol overload. Pt on 5 L [place] O2 in ED. High BNP 2400, CXR +congestion. Had HD last night w/ 3.5 L off. Remove further vol w/ HD today.  CAP - possible, on IV abx per pmd ESKD - on HD MWF. Last HD Friday. HD 1/06 here. HD today.  HTN - hold home coreg , norvasc  and imdur . BP's low normal.   Volume - early edema on initial CXR. Under dry wt now, cont to lower vol w/ HD  Anemia of eskd - Hb 8-10 here. Next esa due in 1 week. Follow.  Secondary hyperparathyroidism - CCa and phos are in range. Cont binders w/ meals     Myer Fret MD  [facility] 08/19/2023, 4:43 PM  Recent Labs  Lab 08/17/23 0806 08/19/23 0325 08/19/23 1053  HGB 7.9*  --  9.3*  ALBUMIN 3.1* 3.4*  --   CALCIUM  8.2*  8.2* 8.5*  --   PHOS 5.8* 5.9*  --   CREATININE 10.87*  10.87* 10.39*  --   K 3.6  3.5 4.9  --    No  results for input(s): IRON, TIBC, FERRITIN in the last 168 hours. Inpatient medications:  apixaban   5 mg Oral BID   arformoterol   15 mcg Nebulization BID   atorvastatin   80 mg Oral Daily   azithromycin   500 mg Oral Daily   budesonide  (PULMICORT ) nebulizer solution  0.5 mg Nebulization BID   calcium  acetate  667 mg Oral TID WC   carvedilol   12.5 mg Oral BID WC   Chlorhexidine  Gluconate Cloth  6 each Topical Q0600   Chlorhexidine  Gluconate Cloth  6 each Topical Q0600   fluticasone   2 spray Each Nare Daily   gabapentin   100 mg Oral Daily   glipiZIDE   2.5 mg Oral Q breakfast   ipratropium-albuterol   3 mL Nebulization Q6H   linagliptin   5 mg Oral Daily   midodrine   10 mg Oral Q  M,W,F-HD   montelukast   10 mg Oral QHS   olopatadine   1 drop Both Eyes BID   predniSONE   40 mg Oral Q breakfast    anticoagulant sodium citrate      cefTRIAXone  (ROCEPHIN )  IV 1 g (08/18/23 1801)   acetaminophen  **OR** acetaminophen , acetaminophen , albuterol , alteplase , anticoagulant sodium citrate , dextromethorphan , feeding supplement (NEPRO CARB STEADY), heparin , lidocaine  (PF), lidocaine -prilocaine , ondansetron  **OR** ondansetron  (ZOFRAN ) IV, pentafluoroprop-tetrafluoroeth

## 2023-08-19 NOTE — Progress Notes (Signed)
 PROGRESS NOTE    Paula [name]   FMW:[id number] DOB: 03/31/1946 DOA: 08/16/2023 PCP: Delbert Clam, MD    Brief Narrative:   Paula [name]  is a 78 y.o. female with past medical history significant for ESRD on HD MWF, asthma, HTN, HLD, type 2 diabetes mellitus, paroxysmal atrial fibrillation who presented to [facility] ED on 1/6 from home via EMS with progressive shortness of breath.  Patient reports wheezing, denies any missed hemodialysis sessions and denies any progressive lower extremity edema.  Denies fever/chills.  On EMS arrival, patient was noted be hypoxic and placed on supplemental oxygen.  Transported to the ED for further evaluation management.  In the ED, temperature 98.1 F, HR 69, RR 22, BP 113/57, SpO2 100% on 3 L nasal count.  WBC 7.0, hemoglobin 8.3, platelet count 183.  Sodium 133, potassium 3.8, chloride 88, CO2 26, glucose 295, BUN 75, creatinine 9.61.  AST 33, ALT 27, total bilirubin 0.9.  High sensitivity troponin 111>98.  BNP 2362.  Procalcitonin 0.61.  COVID/RSV/influenza PCR negative.  Chest x-ray with left lower lung opacity concerning for pneumonia, parabronchial cuffing compatible central airway inflammation, pulmonary vascular congestion.  Assessment & Plan:    Acute hypoxic respiratory failure Patient presenting with progressive shortness of breath.  Noted to be hypoxic on EMS arrival and placed on supplemental oxygen.  Etiology likely multifactorial in the setting of volume overload from CHF exacerbation as well as asthma exacerbation and community-acquired pneumonia. -- Continue supplemental oxygen, maintain SpO2 greater than 92%, on 2L [place] w/ SpO2 98% at rest -- Ambulatory O2 screen today -- Treatment as below  Asthma exacerbation -- Solu-Medrol  40 mg IV every 12 hours, transition to prednisone  40mg  Po daily today -- Pulmicort  neb twice daily -- Brovana  neb BID -- Singulair  10 mg p.o. nightly   Community-acquired pneumonia Patient is afebrile, WBC  count within normal limits.  Procalcitonin slightly elevated 0.61.  Chest x-ray with left lower lobe opacity concerning for pneumonia. -- Azithromycin  500 mg p.o. daily; plan 5-day course -- Ceftriaxone  1 g IV every 24 hours, plan 5-day course  Acute diastolic congestive heart failure TTE with LVEF 60 to 65%, LV with no regional wall motion abnormalities, LV diastolic function not evaluated, LA moderately dilated, IVC normal in size. -- Volume management with HD -- Strict I's and O's and daily weights  Hyponatremia:  Sodium 133 on admission, likely secondary to hypervolemic hyponatremia in the setting of volume overload. -- Na [id number] -- For HD today -- Renal function panel daily  Paroxysmal atrial fibrillation Essential hypertension -- Carvedilol  12.5 mg p.o. twice daily -- Eliquis  5 mg p.o. twice daily -- Holding home amlodipine  and isosorbide  mononitrate -- Continue monitor BP closely  Type 2 diabetes mellitus, with hyperglycemia Hemoglobin A1c 7.4 on 08/17/2023. Home regimen includes glipizide  2.5 mg p.o. daily. -- Refusing insulin  administration -- Tradjenta  5 mg p.o. daily  Hyperlipidemia -- Atorvastatin  80 mg p.o. daily  ESRD on HD MWF -- Nephrology following for continue HD while inpatient, removed 3.5 L UF 1/6 -- HD today -- Midodrine  10 mg p.o. MWF with HD  Weakness/ability/deconditioning: -- PT evaluation: Pending -- OT eval: No follow-up recommended  DVT prophylaxis:  apixaban  (ELIQUIS ) tablet 5 mg    Code Status: Full Code Family Communication: Updated patient's daughter present at bedside this morning  Disposition Plan:  Level of care: Telemetry Cardiac Status is: Inpatient Remains inpatient appropriate because: Continues with wheezing, needs further HD, anticipate discharge home in 1-2 days    Consultants:  Nephrology  Procedures:  None  Antimicrobials:  Azithromycin  1/5>> Ceftriaxone  1/5>>   Subjective: Patient seen examined bedside,  lying in bed.  Daughter present at bedside.  Continues to endorse shortness of breath, although improved.  Additionally complains of wheezing an nonproductive cough.  Feels like it is stuck in her throat.   Next HD planned today.  By Occupational Therapy this morning with no follow-up recommended.  Pending PT evaluation.  No other specific questions, concerns or complaints at this time.  Denies headache, no dizziness, no chest pain, no palpitations, no fever/chills/night sweats, no nausea/vomiting/diarrhea, no focal weakness, no fatigue, no paresthesia.  No acute events overnight per nursing staff.  Objective: Vitals:   08/19/23 0833 08/19/23 0836 08/19/23 1029 08/19/23 1030  BP:      Pulse:   78   Resp:      Temp:      TempSrc:      SpO2: 98% 98% (!) 83% 100%  Weight:      Height:        Intake/Output Summary (Last 24 hours) at 08/19/2023 1048 Last data filed at 08/19/2023 0900 Gross per 24 hour  Intake 60 ml  Output --  Net 60 ml   Filed Weights   08/17/23 1427 08/18/23 0553 08/19/23 0645  Weight: 72.8 kg 69.1 kg 69.1 kg    Examination:  Physical Exam: GEN: NAD, alert and oriented x 3, ill in appearance HEENT: NCAT, PERRL, EOMI, sclera clear, MMM PULM: Breath sounds slightly diminished bilateral bases, + wheezing, normal respiratory effort without accessory muscle use, on 2 L nasal cannula with SpO2 92% at rest CV: IIR, normal rate w/o M/G/R GI: abd soft, NTND, NABS, no R/G/M MSK: no peripheral edema, muscle strength globally intact 5/5 bilateral upper/lower extremities NEURO: CN II-XII intact, no focal deficits, sensation to light touch intact PSYCH: normal mood/affect Integumentary: dry/intact, no rashes or wounds    Data Reviewed: I have personally reviewed following labs and imaging studies  CBC: Recent Labs  Lab 08/16/23 1745 08/16/23 1809 08/17/23 0806  WBC 7.0  --  7.2  NEUTROABS 5.2  --   --   HGB 8.3* 9.2* 7.9*  HCT 25.4* 27.0* 24.7*  MCV 93.0  --  91.8   PLT 183  --  181   Basic Metabolic Panel: Recent Labs  Lab 08/16/23 1745 08/16/23 1809 08/17/23 0806 08/19/23 0325  NA 133* 131* 137  136 133*  K 3.8 3.7 3.6  3.5 4.9  CL 88*  --  92*  90* 89*  CO2 26  --  27  27 26   GLUCOSE 295*  --  97  99 229*  BUN 75*  --  85*  85* 86*  CREATININE 9.61*  --  10.87*  10.87* 10.39*  CALCIUM  8.8*  --  8.2*  8.2* 8.5*  PHOS  --   --  5.8* 5.9*   GFR: Estimated Creatinine Clearance: 4.3 mL/min (A) (by C-G formula based on SCr of 10.39 mg/dL (H)). Liver Function Tests: Recent Labs  Lab 08/16/23 1745 08/17/23 0806 08/19/23 0325  AST 33  --   --   ALT 27  --   --   ALKPHOS 73  --   --   BILITOT 0.9  --   --   PROT 6.8  --   --   ALBUMIN 3.2* 3.1* 3.4*   No results for input(s): LIPASE, AMYLASE in the last 168 hours. No results for input(s): AMMONIA in the last 168 hours.  Coagulation Profile: No results for input(s): INR, PROTIME in the last 168 hours. Cardiac Enzymes: No results for input(s): CKTOTAL, CKMB, CKMBINDEX, TROPONINI in the last 168 hours. BNP (last 3 results) No results for input(s): PROBNP in the last 8760 hours. HbA1C: Recent Labs    08/17/23 0806  HGBA1C 7.4*   CBG: Recent Labs  Lab 08/18/23 0538 08/18/23 1114 08/18/23 1655 08/18/23 2047 08/19/23 0517  GLUCAP 247* 134* 121* 214* 265*   Lipid Profile: No results for input(s): CHOL, HDL, LDLCALC, TRIG, CHOLHDL, LDLDIRECT in the last 72 hours. Thyroid  Function Tests: No results for input(s): TSH, T4TOTAL, FREET4, T3FREE, THYROIDAB in the last 72 hours. Anemia Panel: No results for input(s): VITAMINB12, FOLATE, FERRITIN, TIBC, IRON, RETICCTPCT in the last 72 hours. Sepsis Labs: Recent Labs  Lab 08/16/23 2048  PROCALCITON 0.61    Recent Results (from the past 240 hours)  Resp panel by RT-PCR (RSV, Flu A&B, Covid) Anterior Nasal Swab     Status: None   Collection Time: 08/16/23  6:37 PM    Specimen: Anterior Nasal Swab  Result Value Ref Range Status   SARS Coronavirus 2 by RT PCR NEGATIVE NEGATIVE Final   Influenza A by PCR NEGATIVE NEGATIVE Final   Influenza B by PCR NEGATIVE NEGATIVE Final    Comment: (NOTE) The Xpert Xpress SARS-CoV-2/FLU/RSV plus assay is intended as an aid in the diagnosis of influenza from Nasopharyngeal swab specimens and should not be used as a sole basis for treatment. Nasal washings and aspirates are unacceptable for Xpert Xpress SARS-CoV-2/FLU/RSV testing.  Fact Sheet for Patients: bloggercourse.com  Fact Sheet for Healthcare Providers: seriousbroker.it  This test is not yet approved or cleared by the [place]  FDA and has been authorized for detection and/or diagnosis of SARS-CoV-2 by FDA under an Emergency Use Authorization (EUA). This EUA will remain in effect (meaning this test can be used) for the duration of the COVID-19 declaration under Section 564(b)(1) of the Act, 21 U.S.C. section 360bbb-3(b)(1), unless the authorization is terminated or revoked.     Resp Syncytial Virus by PCR NEGATIVE NEGATIVE Final    Comment: (NOTE) Fact Sheet for Patients: bloggercourse.com  Fact Sheet for Healthcare Providers: seriousbroker.it  This test is not yet approved or cleared by the [place]  FDA and has been authorized for detection and/or diagnosis of SARS-CoV-2 by FDA under an Emergency Use Authorization (EUA). This EUA will remain in effect (meaning this test can be used) for the duration of the COVID-19 declaration under Section 564(b)(1) of the Act, 21 U.S.C. section 360bbb-3(b)(1), unless the authorization is terminated or revoked.  Performed at [facility], [address], [place], [place] [address]          Radiology Studies: No results found.       Scheduled Meds:  apixaban   5 mg Oral BID    arformoterol   15 mcg Nebulization BID   atorvastatin   80 mg Oral Daily   azithromycin   500 mg Oral Daily   budesonide  (PULMICORT ) nebulizer solution  0.5 mg Nebulization BID   calcium  acetate  667 mg Oral TID WC   carvedilol   12.5 mg Oral BID WC   Chlorhexidine  Gluconate Cloth  6 each Topical Q0600   Chlorhexidine  Gluconate Cloth  6 each Topical Q0600   fluticasone   2 spray Each Nare Daily   gabapentin   100 mg Oral Daily   ipratropium-albuterol   3 mL Nebulization Q6H   linagliptin   5 mg Oral Daily   methylPREDNISolone  (SOLU-MEDROL ) injection  40 mg Intravenous Q12H   midodrine   10 mg Oral Q M,W,F-HD   montelukast   10 mg Oral QHS   olopatadine   1 drop Both Eyes BID   predniSONE   40 mg Oral Q breakfast   Continuous Infusions:  anticoagulant sodium citrate      cefTRIAXone  (ROCEPHIN )  IV 1 g (08/18/23 1801)     LOS: 2 days    Time spent: 51 minutes spent on chart review, discussion with nursing staff, consultants, updating family and interview/physical exam; more than 50% of that time was spent in counseling and/or coordination of care.    Camellia PARAS Juaquin Ludington, DO [facility] Available via Epic secure chat 7am-7pm After these hours, please refer to coverage provider listed on [email or web address] 08/19/2023, 10:48 AM

## 2023-08-19 NOTE — Plan of Care (Signed)
   Problem: Education: Goal: Ability to describe self-care measures that may prevent or decrease complications (Diabetes Survival Skills Education) will improve Outcome: Progressing   Problem: Coping: Goal: Ability to adjust to condition or change in health will improve Outcome: Progressing   Problem: Education: Goal: Knowledge of General Education information will improve Description: Including pain rating scale, medication(s)/side effects and non-pharmacologic comfort measures Outcome: Progressing   Problem: Clinical Measurements: Goal: Respiratory complications will improve Outcome: Progressing

## 2023-08-19 NOTE — Progress Notes (Signed)
 Received patient in bed to unit.  Alert and oriented.  Informed consent signed and in chart.   TX duration:3  Patient tolerated well.  Transported back to the room  Alert, without acute distress.  Hand-off given to patient's nurse.   Access used: right AVF Access issues: none  Total UF removed: 2.4L Medication(s) given: none   08/19/23 1654  Vitals  Temp 97.6 F (36.4 C)  Temp Source Oral  BP (!) 132/55  MAP (mmHg) 77  BP Location Left Arm  BP Method Automatic  Patient Position (if appropriate) Lying  Pulse Rate 65  Pulse Rate Source Monitor  ECG Heart Rate 65  Resp 17  Oxygen Therapy  SpO2 98 %  O2 Device Nasal Cannula  O2 Flow Rate (L/min) 2 L/min  During Treatment Monitoring  Blood Flow Rate (mL/min) 399 mL/min  Arterial Pressure (mmHg) -195.14 mmHg  Venous Pressure (mmHg) 254.94 mmHg  TMP (mmHg) 1.41 mmHg  Ultrafiltration Rate (mL/min) 0 mL/min  Dialysate Flow Rate (mL/min) 299 ml/min  Dialysate Potassium Concentration 2  Dialysate Calcium  Concentration 2.5  Duration of HD Treatment -hour(s) 2.95 hour(s)  Cumulative Fluid Removed (mL) per Treatment  2443.62  HD Safety Checks Performed Yes  Intra-Hemodialysis Comments Tx completed  Dialysis Fluid Bolus Normal Saline  Bolus Amount (mL) 300 mL      Kari Kerth S [facility]

## 2023-08-19 NOTE — Evaluation (Signed)
 Occupational Therapy Evaluation Patient Details Name: Paula [name]  MRN: [id number] DOB: 1946-03-14 Today's Date: 08/19/2023   History of Present Illness Pt is a 78 y.o. female presenting with generalized weakness 08/16/2023. CXR left lower lung opacity concerning for pneumonia PMH significant for ESRD on MWF HD, HTN, PAF, HLD, DMII.   Clinical Impression   PTA, pt lived with daughter and who intermittently assisted with ADL; mostly on HD days so that pt could get ready on time. Daughter also performs IADL and assists with medication management. Pt receives medical transport to HD. Per daughter and pt, the hardest part of their day is tub/shower transfers; have recommended tub bench as pt with difficulty lifting LE up over tub. Pt needing min A for transfers and able to don socks and perform standing oral care CGA this sessions. Recommending discharge home with daughter to assist as needed. Will follow acutely to address tub transfers and optimize activity tolerance.       If plan is discharge home, recommend the following: A little help with walking and/or transfers;A little help with bathing/dressing/bathroom;Assistance with cooking/housework;Assist for transportation;Help with stairs or ramp for entrance;Direct supervision/assist for financial management;Direct supervision/assist for medications management    Functional Status Assessment  Patient has had a recent decline in their functional status and demonstrates the ability to make significant improvements in function in a reasonable and predictable amount of time.  Equipment Recommendations  Tub/shower bench    Recommendations for Other Services       Precautions / Restrictions Precautions Precautions: Fall      Mobility Bed Mobility               General bed mobility comments: Long sitting on bed on arrival with feet hanging off edge. Increased time to scoot forward to achieve feet on floor; no assist needed     Transfers Overall transfer level: Needs assistance Equipment used: Rolling walker (2 wheels), 1 person hand held assist Transfers: Sit to/from Stand Sit to Stand: Min assist           General transfer comment: cues and min A for rise.      Balance Overall balance assessment: Needs assistance Sitting-balance support: Bilateral upper extremity supported, Feet supported Sitting balance-Leahy Scale: Fair     Standing balance support: Bilateral upper extremity supported, During functional activity Standing balance-Leahy Scale: Poor Standing balance comment: Reliant on RW.                           ADL either performed or assessed with clinical judgement   ADL Overall ADL's : Needs assistance/impaired Eating/Feeding: Modified independent   Grooming: Oral care;Contact guard assist;Standing Grooming Details (indicate cue type and reason): Pt leaning onto BUE on sink. Upper Body Bathing: Set up;Sitting   Lower Body Bathing: Minimal assistance;Sit to/from stand   Upper Body Dressing : Set up;Sitting   Lower Body Dressing: Set up;Sitting/lateral leans Lower Body Dressing Details (indicate cue type and reason): donning socks sitting EOB with figure 4 position. Pt reports not always able to do this Toilet Transfer: Ambulation;Contact guard assist;Rolling walker (2 wheels) Toilet Transfer Details (indicate cue type and reason): CGA for safety. poor posture noted         Functional mobility during ADLs: Contact guard assist;Rolling walker (2 wheels)       Vision Baseline Vision/History: 0 No visual deficits Ability to See in Adequate Light: 0 Adequate Patient Visual Report: No change from baseline Vision Assessment?:  Wears glasses for reading     Perception Perception: Not tested       Praxis Praxis: Not tested       Pertinent Vitals/Pain Pain Assessment Pain Assessment: Faces Faces Pain Scale: No hurt     Extremity/Trunk Assessment Upper Extremity  Assessment Upper Extremity Assessment: Right hand dominant;Generalized weakness   Lower Extremity Assessment Lower Extremity Assessment: Defer to PT evaluation   Cervical / Trunk Assessment Cervical / Trunk Assessment: Normal   Communication Communication Communication: No apparent difficulties   Cognition Arousal: Alert Behavior During Therapy: WFL for tasks assessed/performed Overall Cognitive Status: Impaired/Different from baseline Area of Impairment: Memory, Safety/judgement                     Memory: Decreased short-term memory   Safety/Judgement: Decreased awareness of safety     General Comments: oriented with increased time. Cues for safety with transfers. Pt reports she can be confused at times but daughter is always with her     General Comments  VSS on RA    Exercises     Shoulder Instructions      Home Living Family/patient expects to be discharged to:: Private residence Living Arrangements: Children (daughter) Available Help at Discharge: Family;Available 24 hours/day Type of Home: Apartment Home Access: Stairs to enter Entrance Stairs-Number of Steps: 14 Entrance Stairs-Rails: Right Home Layout: One level     Bathroom Shower/Tub: Chief Strategy Officer: Standard     Home Equipment: Patent Examiner (4 wheels);Grab bars - tub/shower;Hand held shower head;Shower seat          Prior Functioning/Environment Prior Level of Function : Needs assist             Mobility Comments: Pt states that she mostly uses rollator at home. Needs assist walking at times. ADLs Comments: Daughter intermittently assists with ADL. Needs more assist on dialysis days to be on time per pt. medical transport to dialysis. daughter does the cooking and the cleaning. daughter assists with medication management and prepares them in a cup next to the bedside        OT Problem List: Decreased strength;Decreased activity tolerance;Impaired  balance (sitting and/or standing);Decreased safety awareness;Decreased cognition;Decreased knowledge of use of DME or AE      OT Treatment/Interventions: Self-care/ADL training;Therapeutic exercise;DME and/or AE instruction;Balance training;Patient/family education;Therapeutic activities    OT Goals(Current goals can be found in the care plan section) Acute Rehab OT Goals Patient Stated Goal: get better OT Goal Formulation: With patient Time For Goal Achievement: 09/02/23 Potential to Achieve Goals: Good  OT Frequency: Min 1X/week    Co-evaluation              AM-PAC OT 6 Clicks Daily Activity     Outcome Measure Help from another person eating meals?: None Help from another person taking care of personal grooming?: A Little Help from another person toileting, which includes using toliet, bedpan, or urinal?: A Little Help from another person bathing (including washing, rinsing, drying)?: A Little Help from another person to put on and taking off regular upper body clothing?: A Little Help from another person to put on and taking off regular lower body clothing?: A Little 6 Click Score: 19   End of Session Equipment Utilized During Treatment: Gait belt;Rolling walker (2 wheels) Nurse Communication: Mobility status;Other (comment) (signficiant cough throughout session.)  Activity Tolerance: Patient tolerated treatment well Patient left: in bed;with call bell/phone within reach;with family/visitor present  OT Visit Diagnosis: Unsteadiness  on feet (R26.81);Muscle weakness (generalized) (M62.81)                Time: 0815-0900 OT Time Calculation (min): 45 min Charges:  OT General Charges $OT Visit: 1 Visit OT Evaluation $OT Eval Low Complexity: 1 Low OT Treatments $Self Care/Home Management : 23-37 mins  Elma JONETTA Lebron FREDERICK, OTR/L [facility] Acute Rehabilitation Office: [phone]   Elma JONETTA Lebron 08/19/2023, 9:23 AM

## 2023-08-19 NOTE — TOC Progression Note (Signed)
 Transition of Care [facility]) - Progression Note    Patient Details  Name: Paula [name]  MRN: [id number] Date of Birth: 06-06-1946  [facility]) CM/SW Contact  Marval Gell, RN Phone Number: 08/19/2023, 1:52 PM  Clinical Narrative:     Spoke w patient's daughter at bedside while she was in HD.  We reviewed that she had [facility] in 2023 for [facility] services, they were happy with services provided at that time.  I have made a referral to [facility], and they are able to accept.  Patient is acutely needing home oxygen and we will continue to follow for home oxygen needs    Expected Discharge Plan: Home w Home Health Services Barriers to Discharge: Continued Medical Work up  Expected Discharge Plan and Services In-house Referral: NA Discharge Planning Services: CM Consult Post Acute Care Choice: NA Living arrangements for the past 2 months: Single Family Home                 DME Arranged: N/A DME Agency: NA       HH Arranged: PT           Social Determinants of Health (SDOH) Interventions SDOH Screenings   Food Insecurity: Food Insecurity Present (08/17/2023)  Housing: High Risk (08/17/2023)  Transportation Needs: Unmet Transportation Needs (08/17/2023)  Utilities: At Risk (08/17/2023)  Alcohol  Screen: Low Risk  (11/20/2022)  Depression (PHQ2-9): Low Risk  (11/20/2022)  Financial Resource Strain: Medium Risk (11/20/2022)  Physical Activity: Insufficiently Active (11/20/2022)  Social Connections: Moderately Isolated (08/17/2023)  Stress: No Stress Concern Present (11/20/2022)  Tobacco Use: Low Risk  (08/16/2023)    Readmission Risk Interventions    08/18/2023   12:18 PM 09/05/2021   12:31 PM  Readmission Risk Prevention Plan  Transportation Screening Complete Complete  PCP or Specialist Appt within 5-7 Days  Complete  Home Care Screening  Complete  Medication Review (RN CM)  Complete  Medication Review (RN Care Manager) Complete   PCP or Specialist appointment within 3-5  days of discharge Complete   HRI or Home Care Consult Complete   Palliative Care Screening Not Applicable   Skilled Nursing Facility Not Applicable

## 2023-08-19 NOTE — Evaluation (Signed)
 Physical Therapy Evaluation Patient Details Name: Paula [name]  MRN: [id number] DOB: 12/15/1945 Today's Date: 08/19/2023  History of Present Illness  Pt is a 78 y.o. female presenting with generalized weakness 08/16/2023. CXR left lower lung opacity concerning for pneumonia PMH significant for ESRD on MWF HD, HTN, PAF, HLD, DMII.   Clinical Impression  Paula [name]  is 78 y.o. female admitted with above HPI and diagnosis. Patient is currently limited by functional impairments below (see PT problem list). Patient lives with daughter and is mod ind with rollator at baseline. Currently she required CGA/supervision for bed mobility and cues for hand placement with power up with CGA for safety. Pt mobilized with rollator and maintained safe position to rollator with no overt LOB though gait slow and slight drift Lt/Rt. Patient will benefit from continued skilled PT interventions to address impairments and progress independence with mobility. Acute PT will follow and progress as able.       SATURATION QUALIFICATIONS: (This note is used to comply with regulatory documentation for home oxygen)  Patient Saturations on Room Air at Rest = 95%  Patient Saturations on Room Air while Ambulating = 90%  Patient Saturations on -- Liters of oxygen while Ambulating = NA  Please briefly explain why patient needs home oxygen: NA    If plan is discharge home, recommend the following: A little help with walking and/or transfers;A little help with bathing/dressing/bathroom;Help with stairs or ramp for entrance;Assist for transportation;Assistance with cooking/housework;Direct supervision/assist for medications management   Can travel by private vehicle   No    Equipment Recommendations Other (comment)  Recommendations for Other Services       Functional Status Assessment Patient has had a recent decline in their functional status and demonstrates the ability to make significant improvements in  function in a reasonable and predictable amount of time.     Precautions / Restrictions Precautions Precautions: Fall Restrictions Weight Bearing Restrictions Per Provider Order: No      Mobility  Bed Mobility Overal bed mobility: Needs Assistance Bed Mobility: Supine to Sit           General bed mobility comments: sup/CGA for raising trunk and pivot from supine>sit EOB. pt taking extra time.    Transfers Overall transfer level: Needs assistance Equipment used: Rolling walker (2 wheels), 1 person hand held assist Transfers: Sit to/from Stand Sit to Stand: Min assist           General transfer comment: cues for hand placement and technique with rollator to rise from EOB.    Ambulation/Gait Ambulation/Gait assistance: Contact guard assist Gait Distance (Feet): 100 Feet Assistive device: Rolling walker (2 wheels) Gait Pattern/deviations: Decreased stride length, Step-through pattern Gait velocity: decreased     General Gait Details: pt with slow cautious pace and slighlty drifting Rt/Lt during hallway ambulation however no LOB. pt on RA and SpO2 down to 90% with ambulation, recovered to >94% with seated rest.  Stairs            Wheelchair Mobility     Tilt Bed    Modified Rankin (Stroke Patients Only)       Balance Overall balance assessment: Needs assistance Sitting-balance support: Bilateral upper extremity supported, Feet supported Sitting balance-Leahy Scale: Fair     Standing balance support: Bilateral upper extremity supported, During functional activity Standing balance-Leahy Scale: Poor Standing balance comment: Reliant on RW.  Pertinent Vitals/Pain Pain Assessment Pain Assessment: Faces Pain Score: 0-No pain Faces Pain Scale: No hurt Pain Intervention(s): Monitored during session    Home Living Family/patient expects to be discharged to:: Private residence Living Arrangements: Children  (daughter) Available Help at Discharge: Family;Available 24 hours/day Type of Home: Apartment Home Access: Stairs to enter Entrance Stairs-Rails: Right Entrance Stairs-Number of Steps: 14   Home Layout: One level Home Equipment: Patent Examiner (4 wheels);Grab bars - tub/shower;Hand held shower head;Shower seat      Prior Function Prior Level of Function : Needs assist             Mobility Comments: Pt states that she mostly uses rollator at home. Needs assist walking at times. ADLs Comments: Daughter intermittently assists with ADL. Needs more assist on dialysis days to be on time per pt. medical transport to dialysis. daughter does the cooking and the cleaning. daughter assists with medication management and prepares them in a cup next to the bedside     Extremity/Trunk Assessment   Upper Extremity Assessment Upper Extremity Assessment: Defer to OT evaluation;Right hand dominant    Lower Extremity Assessment Lower Extremity Assessment: Generalized weakness    Cervical / Trunk Assessment Cervical / Trunk Assessment: Normal  Communication   Communication Communication: No apparent difficulties  Cognition Arousal: Alert Behavior During Therapy: WFL for tasks assessed/performed Overall Cognitive Status: Impaired/Different from baseline Area of Impairment: Memory, Safety/judgement                     Memory: Decreased short-term memory   Safety/Judgement: Decreased awareness of safety     General Comments: oriented with increased time. Cues for safety with transfers. Pt reports she can be confused at times but daughter is always with her        General Comments General comments (skin integrity, edema, etc.): VSS on RA    Exercises     Assessment/Plan    PT Assessment Patient needs continued PT services  PT Problem List Decreased strength;Decreased range of motion;Decreased balance;Decreased activity tolerance;Decreased mobility;Decreased  coordination;Decreased knowledge of use of DME;Decreased safety awareness;Decreased knowledge of precautions;Cardiopulmonary status limiting activity       PT Treatment Interventions DME instruction;Gait training;Stair training;Functional mobility training;Therapeutic activities;Therapeutic exercise;Balance training;Neuromuscular re-education;Patient/family education    PT Goals (Current goals can be found in the Care Plan section)  Acute Rehab PT Goals Patient Stated Goal: to go home PT Goal Formulation: With patient Time For Goal Achievement: 08/13/23 Potential to Achieve Goals: Fair    Frequency Min 1X/week     Co-evaluation               AM-PAC PT 6 Clicks Mobility  Outcome Measure Help needed turning from your back to your side while in a flat bed without using bedrails?: A Little Help needed moving from lying on your back to sitting on the side of a flat bed without using bedrails?: A Little Help needed moving to and from a bed to a chair (including a wheelchair)?: A Little Help needed standing up from a chair using your arms (e.g., wheelchair or bedside chair)?: A Little Help needed to walk in hospital room?: A Little Help needed climbing 3-5 steps with a railing? : A Lot 6 Click Score: 17    End of Session Equipment Utilized During Treatment: Gait belt Activity Tolerance: Patient tolerated treatment well Patient left: in bed;with call bell/phone within reach;with family/visitor present (seated EOB) Nurse Communication: Mobility status PT Visit Diagnosis: Other abnormalities of  gait and mobility (R26.89)    Time: 8997-8977 PT Time Calculation (min) (ACUTE ONLY): 20 min   Charges:   PT Evaluation $PT Eval Moderate Complexity: 1 Mod   PT General Charges $$ ACUTE PT VISIT: 1 Visit         Vernell DONEEN KLEIN, DPT Acute Rehabilitation Services Office [phone]  08/19/23 12:07 PM

## 2023-08-20 ENCOUNTER — Other Ambulatory Visit: Payer: Self-pay

## 2023-08-20 DIAGNOSIS — J9601 Acute respiratory failure with hypoxia: Secondary | ICD-10-CM | POA: Diagnosis not present

## 2023-08-20 LAB — GLUCOSE, CAPILLARY: Glucose-Capillary: 115 mg/dL — ABNORMAL HIGH (ref 70–99)

## 2023-08-20 MED ORDER — PREDNISONE 20 MG PO TABS
40.0000 mg | ORAL_TABLET | Freq: Every day | ORAL | 0 refills | Status: DC
  Filled 2023-08-20: qty 6, 3d supply, fill #0

## 2023-08-20 MED ORDER — FLUTICASONE-SALMETEROL 100-50 MCG/ACT IN AEPB: 1.0000 | INHALATION_SPRAY | Freq: Two times a day (BID) | RESPIRATORY_TRACT | 2 refills | Status: DC

## 2023-08-20 MED ORDER — MONTELUKAST SODIUM 10 MG PO TABS: 10.0000 mg | ORAL_TABLET | Freq: Every day | ORAL | 0 refills | Status: DC

## 2023-08-20 MED ORDER — PREDNISONE 20 MG PO TABS: 40.0000 mg | ORAL_TABLET | Freq: Every day | ORAL | 0 refills | Status: AC

## 2023-08-20 MED ORDER — MONTELUKAST SODIUM 10 MG PO TABS
10.0000 mg | ORAL_TABLET | Freq: Every day | ORAL | 0 refills | Status: DC
  Filled 2023-08-20: qty 90, 90d supply, fill #0

## 2023-08-20 MED ORDER — FLUTICASONE-SALMETEROL 100-50 MCG/ACT IN AEPB
1.0000 | INHALATION_SPRAY | Freq: Two times a day (BID) | RESPIRATORY_TRACT | 2 refills | Status: DC
  Filled 2023-08-20: qty 60, 30d supply, fill #0

## 2023-08-20 NOTE — TOC Transition Note (Addendum)
 Transition of Care [facility]) - Discharge Note   Patient Details  Name: Paula [name]  MRN: [id number] Date of Birth: [birth year]  Transition [facility] Care [facility]) CM/SW Contact:  Waddell Barnie Rama, RN Phone Number: 08/20/2023, 10:14 AM   Clinical Narrative:    For dc today, NCM notified Amy with [facility].  Daughter will transport her home.  Mobility tech checking ambulatory sats for oxygen needs. Per walking sats , does not qualify for home oxygen.     Barriers to Discharge: Continued Medical Work up   Patient Goals and CMS Choice Patient states their goals for this hospitalization and ongoing recovery are:: get better   Choice offered to / list presented to : NA      Discharge Placement                       Discharge Plan and Services Additional resources added to the After Visit Summary for   In-house Referral: NA Discharge Planning Services: CM Consult Post Acute Care Choice: NA          DME Arranged: N/A DME Agency: NA       HH Arranged: PT          Social Drivers of Health (SDOH) Interventions SDOH Screenings   Food Insecurity: Food Insecurity Present (08/17/2023)  Housing: High Risk (08/17/2023)  Transportation Needs: Unmet Transportation Needs (08/17/2023)  Utilities: At Risk (08/17/2023)  Alcohol  Screen: Low Risk  (11/20/2022)  Depression (PHQ2-9): Low Risk  (11/20/2022)  Financial Resource Strain: Medium Risk (11/20/2022)  Physical Activity: Insufficiently Active (11/20/2022)  Social Connections: Moderately Isolated (08/17/2023)  Stress: No Stress Concern Present (11/20/2022)  Tobacco Use: Low Risk  (08/16/2023)     Readmission Risk Interventions    08/18/2023   12:18 PM 09/05/2021   12:31 PM  Readmission Risk Prevention Plan  Transportation Screening Complete Complete  PCP or Specialist Appt within 5-7 Days  Complete  Home Care Screening  Complete  Medication Review (RN CM)  Complete  Medication Review (RN Care Manager) Complete   PCP or Specialist  appointment within 3-5 days of discharge Complete   HRI or Home Care Consult Complete   Palliative Care Screening Not Applicable   Skilled Nursing Facility Not Applicable

## 2023-08-20 NOTE — Progress Notes (Signed)
 D/C noted. Contacted [facility] to be advised of pt's d/c today and that pt should resume care tomorrow.   Olivia Canter Renal Navigator [phone]

## 2023-08-20 NOTE — Discharge Summary (Signed)
 Physician Discharge Summary  Dorismar Chay Zollars  FMW:[id number] DOB: 1946-04-09 DOA: 08/16/2023  PCP: Delbert Clam, MD  Admit date: 08/16/2023 Discharge date: 08/20/2023  Admitted From: Home Disposition: Home  Recommendations for Outpatient Follow-up:  Follow up with PCP in 1-2 weeks Continue prednisone  for 3 additional days following discharge Started on Singulair  and montelukast  Encourage fluid restriction as large as contributing factor to her hospitalization with volume overload  Home Health: PT Equipment/Devices: None  Discharge Condition: Stable CODE STATUS: Full code Diet recommendation: Renal diet with 1.2 L fluid restriction  History of present illness:  Paula [name]  is a 78 y.o. female with past medical history significant for ESRD on HD MWF, asthma, HTN, HLD, type 2 diabetes mellitus, paroxysmal atrial fibrillation who presented to [facility] ED on 1/6 from home via EMS with progressive shortness of breath.  Patient reports wheezing, denies any missed hemodialysis sessions and denies any progressive lower extremity edema.  Denies fever/chills.  On EMS arrival, patient was noted be hypoxic and placed on supplemental oxygen.  Transported to the ED for further evaluation management.   In the ED, temperature 98.1 F, HR 69, RR 22, BP 113/57, SpO2 100% on 3 L nasal count.  WBC 7.0, hemoglobin 8.3, platelet count 183.  Sodium 133, potassium 3.8, chloride 88, CO2 26, glucose 295, BUN 75, creatinine 9.61.  AST 33, ALT 27, total bilirubin 0.9.  High sensitivity troponin 111>98.  BNP 2362.  Procalcitonin 0.61.  COVID/RSV/influenza PCR negative.  Chest x-ray with left lower lung opacity concerning for pneumonia, parabronchial cuffing compatible central airway inflammation, pulmonary vascular congestion.  Hospital course:  Acute hypoxic respiratory failure Patient presenting with progressive shortness of breath.  Noted to be hypoxic on EMS arrival and placed on supplemental oxygen.   Etiology likely multifactorial in the setting of volume overload from CHF exacerbation as well as asthma exacerbation and community-acquired pneumonia.  Patient completed course of azithromycin  and ceftriaxone .  Supplemental oxygen weaned off.  Patient was supported with volume removal with hemodialysis.   Asthma exacerbation Patient was started on scheduled neb treatments, steroids which was transition to oral prednisone  in which she will complete 3 additional days following discharge.  Patient was started on Singulair  and Symbicort.  Outpatient follow-up with PCP.  Oxygen titrated off as above.   Community-acquired pneumonia Patient is afebrile, WBC count within normal limits.  Procalcitonin slightly elevated 0.61.  Chest x-ray with left lower lobe opacity concerning for pneumonia.  Completed course of antibiotics with azithromycin  and ceftriaxone .   Acute diastolic congestive heart failure BNP elevated 2362.8.  TTE with LVEF 60 to 65%, LV with no regional wall motion abnormalities, LV diastolic function not evaluated, LA moderately dilated, IVC normal in size.  Continue volume management with HD.  Continue to encourage fluid restricted diet.  Recommend monitoring of weights outpatient.   Hyponatremia:  Sodium 133 on admission, likely secondary to hypervolemic hyponatremia in the setting of volume overload.  Continue fluid management with HD.   Paroxysmal atrial fibrillation Essential hypertension Carvedilol , amlodipine , isosorbide  mononitrate, Eliquis .   Type 2 diabetes mellitus, with hyperglycemia Hemoglobin A1c 7.4 on 08/17/2023. Home regimen includes glipizide  2.5 mg p.o. daily.  Patient refuses insulin  administration.   Hyperlipidemia Atorvastatin  80 mg p.o. daily   ESRD on HD MWF Nephrology was consulted and followed during hospital course.  Patient underwent hemodialysis while inpatient.  Continue midodrine  10 mg p.o. MWF with HD   Weakness/ability/deconditioning: Seen by PT with  recommendation of home health.  OT evaluation with no  follow-up recommended.  Discharge Diagnoses:  Principal Problem:   Acute respiratory failure with hypoxia (HCC) Active Problems:   PNA (pneumonia)   Volume overload   Type 2 diabetes mellitus without complication, without long-term current use of insulin  (HCC)   Essential hypertension   Paroxysmal A-fib (HCC)   Acute hypoxic respiratory failure [facility]    Discharge Instructions  Discharge Instructions     Call MD for:  difficulty breathing, headache or visual disturbances   Complete by: As directed    Call MD for:  extreme fatigue   Complete by: As directed    Call MD for:  persistant dizziness or light-headedness   Complete by: As directed    Call MD for:  persistant nausea and vomiting   Complete by: As directed    Call MD for:  severe uncontrolled pain   Complete by: As directed    Call MD for:  temperature >100.4   Complete by: As directed    Diet - low sodium heart healthy   Complete by: As directed    Increase activity slowly   Complete by: As directed       Allergies as of 08/20/2023       Reactions   Dust Mite Extract Cough   Peanut-containing Drug Products Cough   Ace Inhibitors Cough   Lovastatin Other (See Comments)   Generalized body pain        Medication List     TAKE these medications    Accu-Chek Guide test strip Generic drug: glucose blood USE AS INSTRUCTED 2 TIMES DAILY   acetaminophen  500 MG tablet Commonly known as: TYLENOL  Take 500 mg by mouth every 6 (six) hours as needed for fever, moderate pain or mild pain.   albuterol  108 (90 Base) MCG/ACT inhaler Commonly known as: ProAir  HFA Inhale 1-2 puffs into the lungs every 6 (six) hours as needed for wheezing or shortness of breath.   amLODipine  5 MG tablet Commonly known as: NORVASC  Take 1 tablet (5 mg total) by mouth daily.   atorvastatin  80 MG tablet Commonly known as: LIPITOR  Take 1 tablet (80 mg total) by mouth daily.    Azelastine  HCl 137 MCG/SPRAY Soln Place 2 sprays into both nostrils 2 (two) times daily. Use in each nostril as directed   calcium  acetate 667 MG capsule Commonly known as: PHOSLO  Take 1 capsule (667 mg total) by mouth 3 (three) times daily with meals.   carvedilol  12.5 MG tablet Commonly known as: COREG  Take 1 tablet (12.5 mg total) by mouth 2 (two) times daily with a meal. (Take once a day in the evening on hemodialysis days)   EARACHE DROPS OT Place 1 drop into both ears daily as needed (earache).   Eliquis  5 MG Tabs tablet Generic drug: apixaban  Take 1 tablet (5 mg total) by mouth 2 (two) times daily.   fluticasone  50 MCG/ACT nasal spray Commonly known as: FLONASE  Place 2 sprays into both nostrils daily.   fluticasone -salmeterol 100-50 MCG/ACT Aepb Commonly known as: ADVAIR  Inhale 1 puff into the lungs 2 (two) times daily.   gabapentin  100 MG capsule Commonly known as: NEURONTIN  Take 1 capsule (100 mg total) by mouth 3 (three) times daily.   glipiZIDE  2.5 MG 24 hr tablet Commonly known as: GLUCOTROL  XL Take 1 tablet (2.5 mg total) by mouth daily with breakfast. (needs office visit) What changed:  when to take this additional instructions   HALLS COUGH DROPS MT Use as directed 1 each in the mouth or throat  daily as needed (cough).   isosorbide  mononitrate 60 MG 24 hr tablet Commonly known as: IMDUR  Take 1 tablet (60 mg total) by mouth daily.   lidocaine -prilocaine  cream Commonly known as: EMLA  Apply a small amount to dialysis access one hour prior to dialysis. Wrap with plastic wrap.   loratadine  10 MG tablet Commonly known as: CLARITIN  Take 1 tablet (10 mg total) by mouth daily.   midodrine  10 MG tablet Commonly known as: PROAMATINE  Take 1 tablet (10 mg total) by mouth 3 (three) times a week. Take 30 minutes prior to HD What changed:  when to take this additional instructions   Misc. Devices [employer].  Diagnosis - unstable gait   Misc. Devices  Misc Fistula sleeve for R arm.  Diagnoses: End-stage renal disease on hemodialysis   Misc. Devices Misc 1. Incontinence supplies.  2. Briefs.  Diagnosis-urinary incontinence   Misc. Devices Misc Nepro dialysis protein drink.  Diagnosis end-stage renal disease   Misc. Devices Misc Blood pressure monitor.  Diagnosis hypertension   montelukast  10 MG tablet Commonly known as: SINGULAIR  Take 1 tablet (10 mg total) by mouth at bedtime.   olopatadine  0.1 % ophthalmic solution Commonly known as: PATANOL Place 1 drop into both eyes 2 (two) times daily.   predniSONE  20 MG tablet Commonly known as: DELTASONE  Take 2 tablets (40 mg total) by mouth daily with breakfast for 3 days. Start taking on: August 21, 2023        Follow-up Information     [facility] Follow up.   Why: they will call you in 1-2 days to set up a time to come out to your house Contact information: [address] [place] [address] [phone]         Delbert Clam, MD. Schedule an appointment as soon as possible for a visit in 1 week(s).   Specialty: Family Medicine Contact information: [address] [place] [address] [phone]                Allergies  Allergen Reactions   Dust Mite Extract Cough   Peanut-Containing Drug Products Cough   Ace Inhibitors Cough   Lovastatin Other (See Comments)    Generalized body pain    Consultations: Nephrology   Procedures/Studies: ECHOCARDIOGRAM COMPLETE Result Date: 08/17/2023    ECHOCARDIOGRAM REPORT   Patient Name:   Paula [name]  Date of Exam: 08/17/2023 Medical Rec #:  [id number]           Height:       63.0 in Accession #:    [id number]          Weight:       163.4 lb Date of Birth:  05-07-1946           BSA:          1.774 m Patient Age:    77 years            BP:           117/65 mmHg Patient Gender: F                   HR:           84 bpm. Exam Location:  Inpatient Procedure: 2D Echo,  Cardiac Doppler, Color Doppler and Strain Analysis Indications:    CHF  History:        Patient has prior history of Echocardiogram examinations, most  recent 10/16/2021. CHF, Arrythmias:Atrial Fibrillation; Risk                 Factors:Hypertension and Diabetes.  Sonographer:    Lanell Maduro Referring Phys: [id number] JARED M GARDNER IMPRESSIONS  1. Left ventricular ejection fraction, by estimation, is 60 to 65%. Left ventricular ejection fraction by 2D MOD biplane is 62.1 %. The left ventricle has normal function. The left ventricle has no regional wall motion abnormalities. There is severe concentric left ventricular hypertrophy. Left ventricular diastolic function could not be evaluated.  2. Right ventricular systolic function is low normal. The right ventricular size is normal. Tricuspid regurgitation signal is inadequate for assessing PA pressure.  3. Left atrial size was moderately dilated.  4. The mitral valve is abnormal. Trivial mitral valve regurgitation.  5. The aortic valve is tricuspid. Aortic valve regurgitation is trivial. Aortic valve sclerosis/calcification is present, without any evidence of aortic stenosis.  6. The inferior vena cava is normal in size with greater than 50% respiratory variability, suggesting right atrial pressure of 3 mmHg. Comparison(s): No prior Echocardiogram. FINDINGS  Left Ventricle: Left ventricular ejection fraction, by estimation, is 60 to 65%. Left ventricular ejection fraction by 2D MOD biplane is 62.1 %. The left ventricle has normal function. The left ventricle has no regional wall motion abnormalities. The left ventricular internal cavity size was normal in size. There is severe concentric left ventricular hypertrophy. Left ventricular diastolic function could not be evaluated due to atrial fibrillation. Left ventricular diastolic function could not be evaluated. Right Ventricle: The right ventricular size is normal. No increase in right ventricular wall  thickness. Right ventricular systolic function is low normal. Tricuspid regurgitation signal is inadequate for assessing PA pressure. Left Atrium: Left atrial size was moderately dilated. Right Atrium: Right atrial size was normal in size. Pericardium: There is no evidence of pericardial effusion. Mitral Valve: The mitral valve is abnormal. There is mild calcification of the anterior and posterior mitral valve leaflet(s). Trivial mitral valve regurgitation. Tricuspid Valve: The tricuspid valve is grossly normal. Tricuspid valve regurgitation is trivial. Aortic Valve: The aortic valve is tricuspid. Aortic valve regurgitation is trivial. Aortic valve sclerosis/calcification is present, without any evidence of aortic stenosis. Pulmonic Valve: The pulmonic valve was grossly normal. Pulmonic valve regurgitation is trivial. Aorta: The aortic root and ascending aorta are structurally normal, with no evidence of dilitation. Venous: The inferior vena cava is normal in size with greater than 50% respiratory variability, suggesting right atrial pressure of 3 mmHg. IAS/Shunts: No atrial level shunt detected by color flow Doppler.  LEFT VENTRICLE PLAX 2D                        Biplane EF (MOD) LVIDd:         3.10 cm         LV Biplane EF:   Left LVIDs:         2.30 cm                          ventricular LV PW:         1.80 cm                          ejection LV IVS:        2.00 cm  fraction by LVOT diam:     2.10 cm                          2D MOD LV SV:         75                               biplane is LV SV Index:   42                               62.1 %. LVOT Area:     3.46 cm                                Diastology                                LV e' medial:    6.31 cm/s LV Volumes (MOD)               LV E/e' medial:  23.3 LV vol d, MOD    41.4 ml       LV e' lateral:   8.27 cm/s A2C:                           LV E/e' lateral: 17.8 LV vol d, MOD    41.8 ml A4C: LV vol s, MOD    14.8 ml A2C:  LV vol s, MOD    16.2 ml A4C: LV SV MOD A2C:   26.6 ml LV SV MOD A4C:   41.8 ml LV SV MOD BP:    26.9 ml RIGHT VENTRICLE             IVC RV Basal diam:  3.30 cm     IVC diam: 1.70 cm RV S prime:     10.90 cm/s TAPSE (M-mode): 1.9 cm LEFT ATRIUM             Index        RIGHT ATRIUM           Index LA diam:        3.90 cm 2.20 cm/m   RA Area:     15.90 cm LA Vol (A2C):   86.5 ml 48.75 ml/m  RA Volume:   35.60 ml  20.06 ml/m LA Vol (A4C):   63.0 ml 35.51 ml/m LA Biplane Vol: 76.8 ml 43.28 ml/m  AORTIC VALVE LVOT Vmax:   108.00 cm/s LVOT Vmean:  81.200 cm/s LVOT VTI:    0.216 m  AORTA Ao Root diam: 2.70 cm Ao Asc diam:  3.20 cm MITRAL VALVE MV Area (PHT): 4.80 cm     SHUNTS MV Decel Time: 158 msec     Systemic VTI:  0.22 m MV E velocity: 147.00 cm/s  Systemic Diam: 2.10 cm Vinie Maxcy MD Electronically signed by Vinie Maxcy MD Signature Date/Time: 08/17/2023/11:20:28 AM    Final    DG Chest Portable 1 View Result Date: 08/16/2023 CLINICAL DATA:  Shortness of breath, wheezing and rhonchi. EXAM: PORTABLE CHEST 1 VIEW COMPARISON:  02/02/2023 FINDINGS: Stable cardiomediastinal contours. Identified bilaterally. Aortic atherosclerosis. Pulmonary vascular congestion. Opacity within the left lower lung obscures the left heart border  and may reflect an area of early pneumonia. No opacities identified within the right lung. No signs of pleural effusion or frank interstitial edema. Peribronchial cuffing IMPRESSION: 1. Left lower lung opacity obscures the left heart border and may reflect an area of early pneumonia. 2. Peribronchial cuffing compatible with central airway inflammation. 3. Pulmonary vascular congestion Electronically Signed   By: Waddell Calk M.D.   On: 08/16/2023 17:50   MR BRAIN WO CONTRAST Result Date: 07/29/2023 CLINICAL DATA:  Neuro deficit, acute, stroke suspected EXAM: MRI HEAD WITHOUT CONTRAST TECHNIQUE: Multiplanar, multiecho pulse sequences of the brain and surrounding structures were  obtained without intravenous contrast. COMPARISON:  Same day CT head.  MRI head February 03, 2023. FINDINGS: Brain: Similar prior microhemorrhages in the thalami, larger on the right. Small remote lacunar infarct in the pons. No evidence of acute hemorrhage, acute infarct, mass lesion, midline shift or hydrocephalus. Patchy T2/FLAIR hyperintensities in the white matter are nonspecific but compatible with mild for age chronic microvascular ischemic disease. Vascular: Major arterial flow voids are maintained at the skull base. Skull and upper cervical spine: Normal marrow signal. Sinuses/Orbits: Sinuses.  No acute orbital findings. Other: No mastoid effusions. IMPRESSION: No evidence of acute intracranial abnormality. Electronically Signed   By: Gilmore GORMAN Molt M.D.   On: 07/29/2023 18:40   CT HEAD CODE STROKE WO CONTRAST Result Date: 07/29/2023 CLINICAL DATA:  Code stroke.  Blurred vision EXAM: CT HEAD WITHOUT CONTRAST TECHNIQUE: Contiguous axial images were obtained from the base of the skull through the vertex without intravenous contrast. RADIATION DOSE REDUCTION: This exam was performed according to the departmental dose-optimization program which includes automated exposure control, adjustment of the mA and/or kV according to patient size and/or use of iterative reconstruction technique. COMPARISON:  02/02/2023 FINDINGS: Brain: No evidence of acute infarction, hemorrhage, mass, mass effect, or midline shift. No hydrocephalus or extra-axial collection. Vascular: No hyperdense vessel. Skull: Negative for fracture or focal lesion. Sinuses/Orbits: No acute finding. Status post bilateral lens replacements. Other: The mastoid air cells are well aerated. ASPECTS [facility] Stroke Program Early CT Score) - Ganglionic level infarction (caudate, lentiform nuclei, internal capsule, insula, M1-M3 cortex): 7 - Supraganglionic infarction (M4-M6 cortex): 3 Total score (0-10 with 10 being normal): 10 IMPRESSION: No acute  intracranial process. ASPECTS is 10. Imaging results were communicated on 07/29/2023 at 5:03 pm to provider STACK via secure text paging. Electronically Signed   By: Donald Campion M.D.   On: 07/29/2023 17:03     Subjective: Patient seen examined bedside, resting calmly.  Lying in bed.  Daughter present at bedside.  Oxygen remains weaned off.  Underwent dialysis yesterday.  Stable for discharge home.  Discussed with patient and daughter at bedside regarding fluid restriction.  No other specific questions, concerns or complaints at this time.  Denies headache, no dizziness, no chest pain, no palpitations, no current shortness of breath, no abdominal pain, no fever/chills/night sweats, no nausea cefonicid diarrhea, no focal weakness, no fatigue, no paresthesia.  No acute events overnight per nursing.  Discharge Exam: Vitals:   08/20/23 0816 08/20/23 0817  BP:    Pulse:    Resp:    Temp:    SpO2: 93% 93%   Vitals:   08/20/23 0721 08/20/23 0815 08/20/23 0816 08/20/23 0817  BP: (!) 151/61     Pulse: 64     Resp: 18     Temp: 97.8 F (36.6 C)     TempSrc: Oral     SpO2: 92% 93% 93%  93%  Weight:      Height:        Physical Exam: GEN: NAD, alert and oriented x 3, elderly in appearance HEENT: NCAT, PERRL, EOMI, sclera clear, MMM PULM: CTAB w/o wheezes/crackles, normal respiratory effort, on room air with SpO2 96% at rest CV: RRR w/o M/G/R GI: abd soft, NTND, NABS, no R/G/M MSK: no peripheral edema, muscle strength globally intact 5/5 bilateral upper/lower extremities NEURO: CN II-XII intact, no focal deficits, sensation to light touch intact PSYCH: normal mood/affect Integumentary: dry/intact, no rashes or wounds    The results of significant diagnostics from this hospitalization (including imaging, microbiology, ancillary and laboratory) are listed below for reference.     Microbiology: Recent Results (from the past 240 hours)  Resp panel by RT-PCR (RSV, Flu A&B, Covid)  Anterior Nasal Swab     Status: None   Collection Time: 08/16/23  6:37 PM   Specimen: Anterior Nasal Swab  Result Value Ref Range Status   SARS Coronavirus 2 by RT PCR NEGATIVE NEGATIVE Final   Influenza A by PCR NEGATIVE NEGATIVE Final   Influenza B by PCR NEGATIVE NEGATIVE Final    Comment: (NOTE) The Xpert Xpress SARS-CoV-2/FLU/RSV plus assay is intended as an aid in the diagnosis of influenza from Nasopharyngeal swab specimens and should not be used as a sole basis for treatment. Nasal washings and aspirates are unacceptable for Xpert Xpress SARS-CoV-2/FLU/RSV testing.  Fact Sheet for Patients: bloggercourse.com  Fact Sheet for Healthcare Providers: seriousbroker.it  This test is not yet approved or cleared by the [place]  FDA and has been authorized for detection and/or diagnosis of SARS-CoV-2 by FDA under an Emergency Use Authorization (EUA). This EUA will remain in effect (meaning this test can be used) for the duration of the COVID-19 declaration under Section 564(b)(1) of the Act, 21 U.S.C. section 360bbb-3(b)(1), unless the authorization is terminated or revoked.     Resp Syncytial Virus by PCR NEGATIVE NEGATIVE Final    Comment: (NOTE) Fact Sheet for Patients: bloggercourse.com  Fact Sheet for Healthcare Providers: seriousbroker.it  This test is not yet approved or cleared by the [place]  FDA and has been authorized for detection and/or diagnosis of SARS-CoV-2 by FDA under an Emergency Use Authorization (EUA). This EUA will remain in effect (meaning this test can be used) for the duration of the COVID-19 declaration under Section 564(b)(1) of the Act, 21 U.S.C. section 360bbb-3(b)(1), unless the authorization is terminated or revoked.  Performed at [facility], [address] [place], [place] [address]      Labs: BNP (last 3 results) Recent  Labs    08/16/23 1824  BNP 2,362.8*   Basic Metabolic Panel: Recent Labs  Lab 08/16/23 1745 08/16/23 1809 08/17/23 0806 08/19/23 0325  NA 133* 131* 137  136 133*  K 3.8 3.7 3.6  3.5 4.9  CL 88*  --  92*  90* 89*  CO2 26  --  27  27 26   GLUCOSE 295*  --  97  99 229*  BUN 75*  --  85*  85* 86*  CREATININE 9.61*  --  10.87*  10.87* 10.39*  CALCIUM  8.8*  --  8.2*  8.2* 8.5*  PHOS  --   --  5.8* 5.9*   Liver Function Tests: Recent Labs  Lab 08/16/23 1745 08/17/23 0806 08/19/23 0325  AST 33  --   --   ALT 27  --   --   ALKPHOS 73  --   --  BILITOT 0.9  --   --   PROT 6.8  --   --   ALBUMIN 3.2* 3.1* 3.4*   No results for input(s): LIPASE, AMYLASE in the last 168 hours. No results for input(s): AMMONIA in the last 168 hours. CBC: Recent Labs  Lab 08/16/23 1745 08/16/23 1809 08/17/23 0806 08/19/23 1053  WBC 7.0  --  7.2 9.8  NEUTROABS 5.2  --   --   --   HGB 8.3* 9.2* 7.9* 9.3*  HCT 25.4* 27.0* 24.7* 27.6*  MCV 93.0  --  91.8 91.4  PLT 183  --  181 221   Cardiac Enzymes: No results for input(s): CKTOTAL, CKMB, CKMBINDEX, TROPONINI in the last 168 hours. BNP: Invalid input(s): POCBNP CBG: Recent Labs  Lab 08/19/23 0517 08/19/23 1059 08/19/23 1742 08/19/23 2103 08/20/23 0638  GLUCAP 265* 487* 146* 213* 115*   D-Dimer No results for input(s): DDIMER in the last 72 hours. Hgb A1c No results for input(s): HGBA1C in the last 72 hours. Lipid Profile No results for input(s): CHOL, HDL, LDLCALC, TRIG, CHOLHDL, LDLDIRECT in the last 72 hours. Thyroid  function studies No results for input(s): TSH, T4TOTAL, T3FREE, THYROIDAB in the last 72 hours.  Invalid input(s): FREET3 Anemia work up No results for input(s): VITAMINB12, FOLATE, FERRITIN, TIBC, IRON, RETICCTPCT in the last 72 hours. Urinalysis    Component Value Date/Time   COLORURINE STRAW (A) 11/13/2021 0921   APPEARANCEUR CLEAR 11/13/2021  0921   LABSPEC 1.006 11/13/2021 0921   PHURINE 7.0 11/13/2021 0921   GLUCOSEU NEGATIVE 11/13/2021 0921   HGBUR NEGATIVE 11/13/2021 0921   BILIRUBINUR NEGATIVE 11/13/2021 0921   BILIRUBINUR negative 09/02/2018 1128   KETONESUR NEGATIVE 11/13/2021 0921   PROTEINUR 100 (A) 11/13/2021 0921   UROBILINOGEN 0.2 09/02/2018 1128   UROBILINOGEN 0.2 05/12/2018 1259   NITRITE NEGATIVE 11/13/2021 0921   LEUKOCYTESUR TRACE (A) 11/13/2021 0921   Sepsis Labs Recent Labs  Lab 08/16/23 1745 08/17/23 0806 08/19/23 1053  WBC 7.0 7.2 9.8   Microbiology Recent Results (from the past 240 hours)  Resp panel by RT-PCR (RSV, Flu A&B, Covid) Anterior Nasal Swab     Status: None   Collection Time: 08/16/23  6:37 PM   Specimen: Anterior Nasal Swab  Result Value Ref Range Status   SARS Coronavirus 2 by RT PCR NEGATIVE NEGATIVE Final   Influenza A by PCR NEGATIVE NEGATIVE Final   Influenza B by PCR NEGATIVE NEGATIVE Final    Comment: (NOTE) The Xpert Xpress SARS-CoV-2/FLU/RSV plus assay is intended as an aid in the diagnosis of influenza from Nasopharyngeal swab specimens and should not be used as a sole basis for treatment. Nasal washings and aspirates are unacceptable for Xpert Xpress SARS-CoV-2/FLU/RSV testing.  Fact Sheet for Patients: bloggercourse.com  Fact Sheet for Healthcare Providers: seriousbroker.it  This test is not yet approved or cleared by the [place]  FDA and has been authorized for detection and/or diagnosis of SARS-CoV-2 by FDA under an Emergency Use Authorization (EUA). This EUA will remain in effect (meaning this test can be used) for the duration of the COVID-19 declaration under Section 564(b)(1) of the Act, 21 U.S.C. section 360bbb-3(b)(1), unless the authorization is terminated or revoked.     Resp Syncytial Virus by PCR NEGATIVE NEGATIVE Final    Comment: (NOTE) Fact Sheet for  Patients: bloggercourse.com  Fact Sheet for Healthcare Providers: seriousbroker.it  This test is not yet approved or cleared by the [place]  FDA and has been authorized for detection and/or diagnosis  of SARS-CoV-2 by FDA under an Emergency Use Authorization (EUA). This EUA will remain in effect (meaning this test can be used) for the duration of the COVID-19 declaration under Section 564(b)(1) of the Act, 21 U.S.C. section 360bbb-3(b)(1), unless the authorization is terminated or revoked.  Performed at [facility], [address] [place], [place] [address]      Time coordinating discharge: Over 30 minutes  SIGNED:   Camellia PARAS Kileen Lange, DO  [facility] 08/20/2023, 10:08 AM

## 2023-08-20 NOTE — Discharge Planning (Signed)
 [facility] Patient Discharge Orders - [facility] CLINIC: [facility]  Patient's name: Paula [name]  Admit/DC Dates: 08/16/2023 - 08/20/2023  DISCHARGE DIAGNOSES: AHRF/pulm edema v. possible pneumonia v. asthma exacerbation    HD ORDER CHANGES: Heparin  change: no EDW Change: YES New EDW: 67kg [place] Change: no  ANEMIA MANAGEMENT: Aranesp : Given: no   ESA dose for discharge: mircera 150 mcg IV q 2 weeks, to start on 08/24/23 IV Iron dose at discharge: per protocol Transfusion: Given: no  BONE/MINERAL MEDICATIONS: Hectorol/Calcitriol  change: no Sensipar/Parsabiv change: no  ACCESS INTERVENTION/CHANGE: no Details:  RECENT LABS: Recent Labs  Lab 08/19/23 0325 08/19/23 1053  HGB  --  9.3*  NA 133*  --   K 4.9  --   CALCIUM  8.5*  --   PHOS 5.9*  --   ALBUMIN 3.4*  --    IV ANTIBIOTICS: no Details:  OTHER ANTICOAGULATION: On Coumadin?: no  OTHER/APPTS/LAB ORDERS: - Going home with [facility] services  D/C Meds to be reconciled by nurse after every discharge.  Completed By: Izetta Boehringer, PA-C [facility] Pager [phone]   Reviewed by: MD:______ RN_______

## 2023-08-20 NOTE — Care Management Important Message (Signed)
 Important Message  Patient Details  Name: Paula [name] MRN: [id number] Date of Birth: 01/29/1946   Important Message Given:  Yes - Medicare IM     Renie Ora 08/20/2023, 10:21 AM

## 2023-08-20 NOTE — Progress Notes (Signed)
 Mobility Specialist Progress Note:  Nurse requested Mobility Specialist to perform oxygen saturation test with pt which includes removing pt from oxygen both at rest and while ambulating.  Below are the results from that testing.     Patient Saturations on Room Air at Rest = spO2 96%  Patient Saturations on Room Air while Ambulating = sp02 93% .    At end of testing pt left in room on 0  Liters of oxygen.  Reported results to nurse.    Thersia Minder Mobility Specialist  Please contact vis Secure Chat or  Rehab Office [phone]

## 2023-08-21 ENCOUNTER — Telehealth (HOSPITAL_COMMUNITY): Payer: Self-pay | Admitting: Nephrology

## 2023-08-21 ENCOUNTER — Telehealth: Payer: Self-pay

## 2023-08-21 DIAGNOSIS — N2581 Secondary hyperparathyroidism of renal origin: Secondary | ICD-10-CM | POA: Diagnosis not present

## 2023-08-21 DIAGNOSIS — Z992 Dependence on renal dialysis: Secondary | ICD-10-CM | POA: Diagnosis not present

## 2023-08-21 DIAGNOSIS — N186 End stage renal disease: Secondary | ICD-10-CM | POA: Diagnosis not present

## 2023-08-21 NOTE — Telephone Encounter (Signed)
 Transition of care contact from inpatient facility  Date of Discharge: 08/20/23 Date of Contact:08/21/23 - attempt #1 Method of contact: Phone  Attempted to contact patient to discuss transition of care from inpatient admission. Patient did not answer the phone. Message was left on the patient's voicemail with call back number [phone].  Izetta Boehringer, PA-C [employer] Pager [phone]

## 2023-08-21 NOTE — Transitions of Care (Post Inpatient/ED Visit) (Signed)
   08/21/2023  Name: Paula [name]  MRN: [id number] DOB: June 26, 1946  Today's TOC FU Call Status: Today's TOC FU Call Status:: Unsuccessful Call (1st Attempt) Unsuccessful Call (1st Attempt) Date: 08/21/23  Attempted to reach the patient regarding the most recent Inpatient/ED visit.  Follow Up Plan: Additional outreach attempts will be made to reach the patient to complete the Transitions of Care (Post Inpatient/ED visit) call.   Channing Larry, RN, BA, [facility], CRRN [facility] Coordinator, Transition of Care Ph # [phone]

## 2023-08-23 DIAGNOSIS — Z7984 Long term (current) use of oral hypoglycemic drugs: Secondary | ICD-10-CM | POA: Diagnosis not present

## 2023-08-23 DIAGNOSIS — I132 Hypertensive heart and chronic kidney disease with heart failure and with stage 5 chronic kidney disease, or end stage renal disease: Secondary | ICD-10-CM | POA: Diagnosis not present

## 2023-08-23 DIAGNOSIS — N186 End stage renal disease: Secondary | ICD-10-CM | POA: Diagnosis not present

## 2023-08-23 DIAGNOSIS — I5031 Acute diastolic (congestive) heart failure: Secondary | ICD-10-CM | POA: Diagnosis not present

## 2023-08-23 DIAGNOSIS — J189 Pneumonia, unspecified organism: Secondary | ICD-10-CM | POA: Diagnosis not present

## 2023-08-23 DIAGNOSIS — Z992 Dependence on renal dialysis: Secondary | ICD-10-CM | POA: Diagnosis not present

## 2023-08-23 DIAGNOSIS — J9601 Acute respiratory failure with hypoxia: Secondary | ICD-10-CM | POA: Diagnosis not present

## 2023-08-23 DIAGNOSIS — E8770 Fluid overload, unspecified: Secondary | ICD-10-CM | POA: Diagnosis not present

## 2023-08-23 DIAGNOSIS — E1122 Type 2 diabetes mellitus with diabetic chronic kidney disease: Secondary | ICD-10-CM | POA: Diagnosis not present

## 2023-08-24 ENCOUNTER — Telehealth: Payer: Self-pay | Admitting: Family Medicine

## 2023-08-24 DIAGNOSIS — N186 End stage renal disease: Secondary | ICD-10-CM | POA: Diagnosis not present

## 2023-08-24 DIAGNOSIS — N2581 Secondary hyperparathyroidism of renal origin: Secondary | ICD-10-CM | POA: Diagnosis not present

## 2023-08-24 DIAGNOSIS — Z992 Dependence on renal dialysis: Secondary | ICD-10-CM | POA: Diagnosis not present

## 2023-08-24 LAB — EXPECTORATED SPUTUM ASSESSMENT W GRAM STAIN, RFLX TO RESP C

## 2023-08-24 NOTE — Telephone Encounter (Signed)
 Home Health Verbal Orders - Caller/Agency: Vinnie Langton With [facility]  Callback Number: [phone]  Service Requested: Physical Therapy  Frequency: 2 week 4, 1 week 5  Any new concerns about the patient? No

## 2023-08-24 NOTE — Telephone Encounter (Signed)
 Ok to give verbal

## 2023-08-25 ENCOUNTER — Encounter (HOSPITAL_COMMUNITY): Payer: Self-pay

## 2023-08-25 ENCOUNTER — Other Ambulatory Visit (HOSPITAL_BASED_OUTPATIENT_CLINIC_OR_DEPARTMENT_OTHER): Payer: Self-pay

## 2023-08-25 ENCOUNTER — Other Ambulatory Visit (HOSPITAL_COMMUNITY): Payer: Self-pay

## 2023-08-25 ENCOUNTER — Telehealth: Payer: Self-pay

## 2023-08-25 ENCOUNTER — Ambulatory Visit (INDEPENDENT_AMBULATORY_CARE_PROVIDER_SITE_OTHER): Payer: Medicare HMO | Admitting: Endocrinology

## 2023-08-25 ENCOUNTER — Encounter: Payer: Self-pay | Admitting: Endocrinology

## 2023-08-25 VITALS — BP 118/62 | HR 68 | Resp 18 | Ht 63.0 in | Wt 162.6 lb

## 2023-08-25 DIAGNOSIS — E1169 Type 2 diabetes mellitus with other specified complication: Secondary | ICD-10-CM | POA: Diagnosis not present

## 2023-08-25 DIAGNOSIS — Z7984 Long term (current) use of oral hypoglycemic drugs: Secondary | ICD-10-CM | POA: Diagnosis not present

## 2023-08-25 MED ORDER — LINAGLIPTIN 5 MG PO TABS
5.0000 mg | ORAL_TABLET | Freq: Every day | ORAL | 3 refills | Status: DC
  Filled 2023-08-25: qty 90, 90d supply, fill #0
  Filled 2023-08-26 – 2023-11-20 (×2): qty 90, 90d supply, fill #1
  Filled 2024-01-20 – 2024-02-20 (×2): qty 90, 90d supply, fill #2
  Filled 2024-04-25: qty 90, 90d supply, fill #3

## 2023-08-25 MED ORDER — LINAGLIPTIN 5 MG PO TABS
5.0000 mg | ORAL_TABLET | Freq: Every day | ORAL | 3 refills | Status: DC
  Filled 2023-08-25: qty 90, 90d supply, fill #0

## 2023-08-25 NOTE — Transitions of Care (Post Inpatient/ED Visit) (Signed)
   08/25/2023  Name: Paula [name]  MRN: [id number] DOB: Dec 29, 1945  Today's TOC FU Call Status: Today's TOC FU Call Status:: Unsuccessful Call (2nd Attempt) Unsuccessful Call (2nd Attempt) Date: 08/25/23  Attempted to reach the patient regarding the most recent Inpatient/ED visit.  Follow Up Plan: Additional outreach attempts will be made to reach the patient to complete the Transitions of Care (Post Inpatient/ED visit) call.   Channing Larry, RN, BA, [facility], CRRN [facility] Coordinator, Transition of Care Ph # [phone]

## 2023-08-25 NOTE — Patient Instructions (Addendum)
 Take Tradjenta 5mg  daily, check cost with pharmacy. You can stop glipizide after taking trajdenta.   Check glucose 1-2 times a day, sometimes at bedtime.

## 2023-08-25 NOTE — Progress Notes (Signed)
 Outpatient Endocrinology Note Paula Fouty, MD   Patient's Name: Paula [name]     DOB: 10-26-1945    MRN: [id number]                                                    REASON OF VISIT: New consult / Follow up for type 2 diabetes mellitus  REFERRING PROVIDER: Delbert Clam, MD  PCP: Delbert Clam, MD  HISTORY OF PRESENT ILLNESS:   Paula [name]  is a 78 y.o. old female with past medical history listed below, is here for new consult for type 2 diabetes mellitus.   Pertinent Diabetes History: Patient was diagnosed with type 2 diabetes mellitus in early 2000.  Patient has uncontrolled type 2 diabetes mellitus.  Referred and presented for evaluation and management to endocrinology.  Patient is accompanied by daughter.  Patient has ESRD on hemodialysis.  Patient used to be on metformin  which was later stopped due to renal insufficiency.  Patient has been on glipizide , used to be on higher dose was gradually reduced due to hypoglycemia.  Patient reports she has never been on insulin  therapy at home.  She had insulin  while hospitalized and had bad experience with feeling quite weak and preferred not to be on insulin  therapy.  Chronic Diabetes Complications : Retinopathy: no. Last ophthalmology exam was done on annually, reportedly, following with ophthalmology regularly.  Nephropathy: ESRD, on dialysis. Peripheral neuropathy: yes, feet pain.  Coronary artery disease: no Stroke: no  Relevant comorbidities and cardiovascular risk factors: Obesity: no Body mass index is 28.8 kg/m.  Hypertension: Yes  Hyperlipidemia : Yes, on statin   Current / Home Diabetic regimen includes:  Glipizide  XR 2.5 mg daily.  Sometimes takes 2 tablets in case of hypoglycemia.  Prior diabetic medications: Metformin  in the past, does not recall taking any other medications.  Glycemic data:   300-400s range. This morning 196. Was on presdnosone for breathing problem.  She forgot to bring  glucometer.  She checks blood sugar at home 1-2 times a day.  Hypoglycemia: Patient has no hypoglycemic episodes. Patient has hypoglycemia awareness.  Factors modifying glucose control: 1.  Diabetic diet assessment: 3 meals a day.  Not much eating on the day of dialysis.  2.  Staying active or exercising: No formal exercise.  Limited physical activities.  3.  Medication compliance: compliant all of the time.  Interval history  Patient was on prednisone  completed on last Sunday for breathing problem.  Patient had hyperglycemia with blood sugar significantly high in the range of 400s however slowly trending down after completing prednisone .  This morning blood sugar was 196.  Patient reports she had blood sugar more than 200 prior to being on prednisone  as well.  Patient was taking 2 tablets of glipizide  total 5 mg daily when having hyperglycemia.  Recent hemoglobin A1c 7.6%.  No glucose data to review.  REVIEW OF SYSTEMS As per history of present illness.   PAST MEDICAL HISTORY: Past Medical History:  Diagnosis Date   Allergic rhinitis    Arthritis    Asthma    Brain aneurysm    Cough    Diabetes mellitus    Diastolic CHF, chronic (HCC) 10/11/2011   ESRD on hemodialysis (HCC)    MWF   GERD (gastroesophageal reflux disease)    History of colon polyps 2012  tubular adenoma    Hyperlipidemia    Hypertension    Neuropathy 10/11/2011   PONV (postoperative nausea and vomiting)    one time after lymph node surgery    PAST SURGICAL HISTORY: Past Surgical History:  Procedure Laterality Date   AV FISTULA PLACEMENT Right 10/15/2021   Procedure: ARTERIOVENOUS (AV) FISTULA  WITH PLACEMENT OF GORE-TEX STRETCH  GRAFT (4-32mmx45cm);  Surgeon: Eliza Lonni RAMAN, MD;  Location: [facility] OR;  Service: Vascular;  Laterality: Right;   CATARACT EXTRACTION     right eye   IR 3D INDEPENDENT WKST  12/14/2017   IR ANGIO INTRA EXTRACRAN SEL COM CAROTID INNOMINATE BILAT MOD SED  09/15/2017   IR  ANGIO INTRA EXTRACRAN SEL COM CAROTID INNOMINATE UNI L MOD SED  12/14/2017   IR ANGIO VERTEBRAL SEL VERTEBRAL BILAT MOD SED  09/15/2017   IR RADIOLOGIST EVAL & MGMT  09/10/2017   IR RADIOLOGIST EVAL & MGMT  10/19/2017   lymphatic mass surgery     NASAL TURBINATE REDUCTION     RADIOLOGY WITH ANESTHESIA N/A 12/14/2017   Procedure: RADIOLOGY WITH ANESTHESIA EMBOLIZATION;  Surgeon: Dolphus Carrion, MD;  Location: [facility] OR;  Service: Radiology;  Laterality: N/A;   VIDEO BRONCHOSCOPY Bilateral 11/18/2017   Procedure: VIDEO BRONCHOSCOPY WITHOUT FLUORO;  Surgeon: Shelah Lamar RAMAN, MD;  Location: [facility] ENDOSCOPY;  Service: Cardiopulmonary;  Laterality: Bilateral;    ALLERGIES: Allergies  Allergen Reactions   Dust Mite Extract Cough   Peanut-Containing Drug Products Cough   Ace Inhibitors Cough   Lovastatin Other (See Comments)    Generalized body pain    FAMILY HISTORY:  Family History  Problem Relation Age of Onset   Hypertension Mother    Asthma Sister    Colon cancer Neg Hx    Allergic rhinitis Neg Hx    Eczema Neg Hx     SOCIAL HISTORY: Social History   Socioeconomic History   Marital status: Divorced    Spouse name: Not on file   Number of children: 5   Years of education: Not on file   Highest education level: Master's degree (e.g., MA, MS, MEng, MEd, MSW, MBA)  Occupational History   Occupation: Unemployed  Tobacco Use   Smoking status: Never    Passive exposure: Never   Smokeless tobacco: Never  Vaping Use   Vaping status: Never Used  Substance and Sexual Activity   Alcohol  use: No   Drug use: No   Sexual activity: Not Currently    Birth control/protection: Post-menopausal, None  Other Topics Concern   Not on file  Social History Narrative   Lives alone.  5 children with two deceased.     Social Drivers of Health   Financial Resource Strain: Medium Risk (11/20/2022)   Overall Financial Resource Strain (CARDIA)    Difficulty of Paying Living Expenses: Somewhat  hard  Food Insecurity: Food Insecurity Present (08/17/2023)   Hunger Vital Sign    Worried About Running Out of Food in the Last Year: Often true    Ran Out of Food in the Last Year: Never true  Transportation Needs: Unmet Transportation Needs (08/17/2023)   PRAPARE - Administrator, Civil Service (Medical): No    Lack of Transportation (Non-Medical): Yes  Physical Activity: Insufficiently Active (11/20/2022)   Exercise Vital Sign    Days of Exercise per Week: 1 day    Minutes of Exercise per Session: 10 min  Stress: No Stress Concern Present (11/20/2022)   [employer] of Occupational Health -  Occupational Stress Questionnaire    Feeling of Stress : Only a little  Social Connections: Moderately Isolated (08/17/2023)   Social Connection and Isolation Panel [NHANES]    Frequency of Communication with Friends and Family: More than three times a week    Frequency of Social Gatherings with Friends and Family: Once a week    Attends Religious Services: More than 4 times per year    Active Member of [employer] or Organizations: No    Attends Banker Meetings: Never    Marital Status: Widowed    MEDICATIONS:  Current Outpatient Medications  Medication Sig Dispense Refill   acetaminophen  (TYLENOL ) 500 MG tablet Take 500 mg by mouth every 6 (six) hours as needed for fever, moderate pain or mild pain.     albuterol  (PROAIR  HFA) 108 (90 Base) MCG/ACT inhaler Inhale 1-2 puffs into the lungs every 6 (six) hours as needed for wheezing or shortness of breath. 6.7 g 2   amLODipine  (NORVASC ) 5 MG tablet Take 1 tablet (5 mg total) by mouth daily. 90 tablet 0   apixaban  (ELIQUIS ) 5 MG TABS tablet Take 1 tablet (5 mg total) by mouth 2 (two) times daily. 180 tablet 1   atorvastatin  (LIPITOR ) 80 MG tablet Take 1 tablet (80 mg total) by mouth daily. 90 tablet 0   azelastine  (ASTELIN ) 0.1 % nasal spray Place 2 sprays into both nostrils 2 (two) times daily. Use in each nostril as directed 30  mL 12   calcium  acetate (PHOSLO ) 667 MG capsule Take 1 capsule (667 mg total) by mouth 3 (three) times daily with meals. 270 capsule 2   carvedilol  (COREG ) 12.5 MG tablet Take 1 tablet (12.5 mg total) by mouth 2 (two) times daily with a meal. (Take once a day in the evening on hemodialysis days) 180 tablet 1   fluticasone  (FLONASE ) 50 MCG/ACT nasal spray Place 2 sprays into both nostrils daily. 16 g 12   fluticasone -salmeterol (ADVAIR ) 100-50 MCG/ACT AEPB Inhale 1 puff into the lungs 2 (two) times daily. 60 each 2   gabapentin  (NEURONTIN ) 100 MG capsule Take 1 capsule (100 mg total) by mouth 3 (three) times daily. 270 capsule 1   glipiZIDE  (GLUCOTROL  XL) 2.5 MG 24 hr tablet Take 1 tablet (2.5 mg total) by mouth daily with breakfast. (needs office visit) (Patient taking differently: Take 2.5 mg by mouth See admin instructions. Take 2.5mg  by mouth once a day. If blood sugar is high, take an additional tablet on the same day.) 90 tablet 0   glucose blood (ACCU-CHEK GUIDE) test strip USE AS INSTRUCTED 2 TIMES DAILY 100 each 2   Homeopathic Products (EARACHE DROPS OT) Place 1 drop into both ears daily as needed (earache).     isosorbide  mononitrate (IMDUR ) 60 MG 24 hr tablet Take 1 tablet (60 mg total) by mouth daily. 90 tablet 0   lidocaine -prilocaine  (EMLA ) cream Apply a small amount to dialysis access one hour prior to dialysis. Wrap with plastic wrap. 30 g PRN   loratadine  (CLARITIN ) 10 MG tablet Take 1 tablet (10 mg total) by mouth daily. 90 tablet 1   midodrine  (PROAMATINE ) 10 MG tablet Take 1 tablet (10 mg total) by mouth 3 (three) times a week. Take 30 minutes prior to HD (Patient taking differently: Take 10 mg by mouth See admin instructions. Take 1 tablet by mouth prior to dialysis if blood pressure is low.) 15 tablet 3   Misc. Devices [employer]  Diagnosis - unstable gait 1 each 0  Misc. Devices MISC Fistula sleeve for R arm.  Diagnoses: End-stage renal disease on hemodialysis 1 each 0   Misc.  Devices MISC 1. Incontinence supplies.  2. Briefs.  Diagnosis-urinary incontinence 1 each 11   Misc. Devices MISC Nepro dialysis protein drink.  Diagnosis end-stage renal disease 30 each 11   Misc. Devices MISC Blood pressure monitor.  Diagnosis hypertension 1 each 0   montelukast  (SINGULAIR ) 10 MG tablet Take 1 tablet (10 mg total) by mouth at bedtime. 90 tablet 0   olopatadine  (PATANOL) 0.1 % ophthalmic solution Place 1 drop into both eyes 2 (two) times daily. 5 mL 12   Throat Lozenges (HALLS COUGH DROPS MT) Use as directed 1 each in the mouth or throat daily as needed (cough).     linagliptin  (TRADJENTA ) 5 MG TABS tablet Take 1 tablet (5 mg total) by mouth daily. 90 tablet 3   No current facility-administered medications for this visit.    PHYSICAL EXAM: Vitals:   08/25/23 1524  BP: 118/62  Pulse: 68  Resp: 18  SpO2: 94%  Weight: 162 lb 9.6 oz (73.8 kg)  Height: 5' 3 (1.6 m)   Body mass index is 28.8 kg/m.  Wt Readings from Last 3 Encounters:  08/25/23 162 lb 9.6 oz (73.8 kg)  08/20/23 148 lb 2.4 oz (67.2 kg)  07/29/23 163 lb 5.8 oz (74.1 kg)    General: Well developed, well nourished female in no apparent distress.  HEENT: AT/[place], no external lesions.  Eyes: Conjunctiva clear and no icterus. Neck: Neck supple  Lungs: Respirations not labored Neurologic: Alert, oriented, normal speech Extremities / Skin: Dry. No sores or rashes noted.  Psychiatric: Does not appear depressed or anxious  Diabetic Foot Exam - Simple   No data filed     LABS Reviewed Lab Results  Component Value Date   HGBA1C 7.4 (H) 08/17/2023   HGBA1C 6.5 04/16/2023   HGBA1C 6.6 11/20/2022   No results found for: FRUCTOSAMINE Lab Results  Component Value Date   CHOL 106 09/01/2021   HDL 46 09/01/2021   LDLCALC 48 09/01/2021   TRIG 61 09/01/2021   CHOLHDL 2.3 09/01/2021   Lab Results  Component Value Date   MICRALBCREAT 3,218 (H) 01/04/2021   MICRALBCREAT 2,873 (H) 09/14/2019   Lab  Results  Component Value Date   CREATININE 10.39 (H) 08/19/2023   No results found for: GFR  ASSESSMENT / PLAN  1. Type 2 diabetes mellitus with other specified complication, without long-term current use of insulin  (HCC)     Diabetes Mellitus type 2, complicated by CKD/ESRD/diabetic neuropathy. - Diabetic status / severity: Uncontrolled with hyperglycemia.  Lab Results  Component Value Date   HGBA1C 7.4 (H) 08/17/2023    - Hemoglobin A1c goal : <7%  Discussed about type 2 diabetes mellitus and importance of controlling blood sugar.  Patient prefers not to be on insulin  therapy, she had bad experience with insulin  while hospitalized.  Discussed about limiting carbohydrate and sugary meals.  - Medications: See below.  I) start linagliptin  5 mg daily.  Asked to check cost and coverage with pharmacy.  Order placed.  Stop glipizide  being on linagliptin .  Okay to use glipizide  in case of hyperglycemia men related with or in case of taking steroid.  If linagliptin  is not covered by medical insurance, will stay on the glipizide  extended release 2.5 to 5 mg daily.   - Home glucose testing: In the morning fasting and at bedtime occasionally and asked to bring glucometer  in the follow-up visit. - Discussed/ Gave Hypoglycemia treatment plan.  # Consult : not required at this time.   # Annual urine for microalbuminuria/ creatinine ratio, + microalbuminuria currently, she has ESRD following with nephrology on hemodialysis. Last  Lab Results  Component Value Date   MICRALBCREAT 3,218 (H) 01/04/2021    # Foot check nightly / neuropathy.  # Annual dilated diabetic eye exams.   - Diet: Make healthy diabetic food choices - Life style / activity / exercise: Discussed.  2. Blood pressure  -  BP Readings from Last 1 Encounters:  08/25/23 118/62    - Control is in target.  - No change in current plans.  3. Lipid status / Hyperlipidemia - Last  Lab Results  Component  Value Date   LDLCALC 48 09/01/2021   - Continue atorvastatin  80 mg daily.  Managed by PCP.  Diagnoses and all orders for this visit:  Type 2 diabetes mellitus with other specified complication, without long-term current use of insulin  (HCC) -     linagliptin  (TRADJENTA ) 5 MG TABS tablet; Take 1 tablet (5 mg total) by mouth daily.  Other orders -     Discontinue: linagliptin  (TRADJENTA ) 5 MG TABS tablet; Take 1 tablet (5 mg total) by mouth daily.    DISPOSITION Follow up in clinic in 6 weeks suggested.   All questions answered and patient verbalized understanding of the plan.  Gayla Benn, MD [facility] Endocrinology [facility] [address] [place], [address] [place], [place] [address] Phone # [phone]  At least part of this note was generated using voice recognition software. Inadvertent word errors may have occurred, which were not recognized during the proofreading process.

## 2023-08-26 ENCOUNTER — Other Ambulatory Visit: Payer: Self-pay

## 2023-08-26 ENCOUNTER — Telehealth: Payer: Self-pay

## 2023-08-26 DIAGNOSIS — N186 End stage renal disease: Secondary | ICD-10-CM | POA: Diagnosis not present

## 2023-08-26 DIAGNOSIS — Z992 Dependence on renal dialysis: Secondary | ICD-10-CM | POA: Diagnosis not present

## 2023-08-26 DIAGNOSIS — N2581 Secondary hyperparathyroidism of renal origin: Secondary | ICD-10-CM | POA: Diagnosis not present

## 2023-08-26 NOTE — Transitions of Care (Post Inpatient/ED Visit) (Signed)
   08/26/2023  Name: Paula [name]  MRN: [id number] DOB: 1945-11-23  Today's TOC FU Call Status: Today's TOC FU Call Status:: Unsuccessful Call (3rd Attempt) Unsuccessful Call (3rd Attempt) Date: 08/26/23  Attempted to reach the patient regarding the most recent Inpatient/ED visit.  Follow Up Plan: Additional outreach attempts will be made to reach the patient to complete the Transitions of Care (Post Inpatient/ED visit) call.   Santina Cull, RN, BA, [facility], CRRN [facility] Coordinator, Transition of Care Ph # [phone]

## 2023-08-27 ENCOUNTER — Encounter: Payer: Medicare HMO | Admitting: Internal Medicine

## 2023-08-27 ENCOUNTER — Encounter: Payer: Self-pay | Admitting: Endocrinology

## 2023-08-27 ENCOUNTER — Ambulatory Visit: Payer: Medicare HMO | Attending: Internal Medicine | Admitting: Internal Medicine

## 2023-08-27 ENCOUNTER — Encounter: Payer: Self-pay | Admitting: Internal Medicine

## 2023-08-27 ENCOUNTER — Telehealth: Payer: Self-pay

## 2023-08-27 VITALS — BP 119/62 | HR 68 | Temp 98.4°F | Ht 63.0 in | Wt 160.0 lb

## 2023-08-27 DIAGNOSIS — Z09 Encounter for follow-up examination after completed treatment for conditions other than malignant neoplasm: Secondary | ICD-10-CM

## 2023-08-27 DIAGNOSIS — I5031 Acute diastolic (congestive) heart failure: Secondary | ICD-10-CM | POA: Diagnosis not present

## 2023-08-27 DIAGNOSIS — Z992 Dependence on renal dialysis: Secondary | ICD-10-CM | POA: Diagnosis not present

## 2023-08-27 DIAGNOSIS — Z7984 Long term (current) use of oral hypoglycemic drugs: Secondary | ICD-10-CM | POA: Diagnosis not present

## 2023-08-27 DIAGNOSIS — R040 Epistaxis: Secondary | ICD-10-CM | POA: Diagnosis not present

## 2023-08-27 DIAGNOSIS — E119 Type 2 diabetes mellitus without complications: Secondary | ICD-10-CM

## 2023-08-27 DIAGNOSIS — E8770 Fluid overload, unspecified: Secondary | ICD-10-CM | POA: Diagnosis not present

## 2023-08-27 DIAGNOSIS — I5032 Chronic diastolic (congestive) heart failure: Secondary | ICD-10-CM

## 2023-08-27 DIAGNOSIS — N186 End stage renal disease: Secondary | ICD-10-CM

## 2023-08-27 DIAGNOSIS — I132 Hypertensive heart and chronic kidney disease with heart failure and with stage 5 chronic kidney disease, or end stage renal disease: Secondary | ICD-10-CM | POA: Diagnosis not present

## 2023-08-27 DIAGNOSIS — J189 Pneumonia, unspecified organism: Secondary | ICD-10-CM

## 2023-08-27 DIAGNOSIS — E1122 Type 2 diabetes mellitus with diabetic chronic kidney disease: Secondary | ICD-10-CM | POA: Diagnosis not present

## 2023-08-27 DIAGNOSIS — J9601 Acute respiratory failure with hypoxia: Secondary | ICD-10-CM | POA: Diagnosis not present

## 2023-08-27 NOTE — Progress Notes (Signed)
Patient ID: Paula [name], female    DOB: 1946/01/04  MRN: [id number]  CC:Hospitalization Follow-up (Hospitalization /Discuss glipizide medication discrepancy /Nose bleeds when blowing nose /Already received flu vax)   Subjective: Paula [name] is a 78 y.o. female who presents for chronic ds management. Daughter Paula [name] is with her. Her concerns today include:  Patient with history of PAF, HTN, chronic diastolic CHF, aortic atherosclerosis, ILD, DM type II, ESRD,  Patient hospitalized 1/5 - 04/2024 with acute hypoxic respiratory failure due to fluid overload/acute diastolic CHF, left lower lobe pneumonia and asthma exacerbation.  Screen negative for COVID/RSV/influenza.  Patient received dialysis.  Completed antibiotic treatment by the time of hospital discharge and was able to be weaned off oxygen. Today: She states that she feels a lot better.  She used albuterol nebulizer 3 times since hospital discharge.  She is using the Advair inhaler twice a day.  Today she reports that when she blows her nose she noticed small blood clot that came out of the left nostril.  She has not noticed it since.  Daughter thinks she may have picked her nose but patient said she did not.  She is on Eliquis for PAF.  She continues to go to dialysis 3 days a week on Monday Wednesday and Friday. She denies any shortness of breath at this time.  No lower extremity edema.  In regards to her diabetes, she saw her her endocrinologist since hospital discharge.  He has discontinued glipizide and has placed her on linagliptin 5 mg instead.  She just picked up the prescription today.  Daughter requested that it be noted on her chart that she does not tolerate insulin as it causes her to feel weak and dizzy.  Patient Active Problem List   Diagnosis Date Noted   Acute respiratory failure with hypoxia (HCC) 08/17/2023   PNA (pneumonia) 08/17/2023   Volume overload 08/17/2023   Acute hypoxic respiratory failure  (HCC) 08/17/2023   SNHL (sensory-neural hearing loss), 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   ESRD on dialysis (HCC) 11/20/2022   Mold exposure 08/14/2022   Opioid withdrawal (HCC) 08/14/2022   ILD (interstitial lung disease) (HCC) 08/14/2022   Secondary hyperparathyroidism, renal (HCC) 08/14/2022   Paroxysmal A-fib (HCC) 05/20/2022   Hypertrophy of nasal turbinates 09/05/2021   Chronic kidney disease, stage 5 (HCC)    Aortic atherosclerosis (HCC) 06/24/2021   Chronic rhinitis 10/01/2017   Seasonal allergic conjunctivitis 10/01/2017   Aneurysm (HCC) 09/24/2017   Acute metabolic encephalopathy 09/02/2017   Allergic rhinitis 08/20/2017   Palpitations 07/10/2016   Gout 01/23/2016   History of cataract 02/27/2015   Seasonal and perennial allergic rhinitis 02/15/2012   Chronic diastolic CHF (congestive heart failure) (HCC) 10/11/2011   GERD (gastroesophageal reflux disease)    Type 2 diabetes mellitus without complication, without long-term current use of insulin (HCC) 07/03/2010   Hyperlipidemia 07/03/2010   Essential hypertension 07/03/2010     Current Outpatient Medications on File Prior to Visit  Medication Sig Dispense Refill   acetaminophen (TYLENOL) 500 MG tablet Take 500 mg by mouth every 6 (six) hours as needed for fever, moderate pain or mild pain.     albuterol (PROAIR HFA) 108 (90 Base) MCG/ACT inhaler Inhale 1-2 puffs into the lungs every 6 (six) hours as needed for wheezing or shortness of breath. 6.7 g 2   amLODipine (NORVASC) 5 MG tablet Take 1 tablet (5 mg total) by mouth daily. 90 tablet 0   apixaban (ELIQUIS) 5 MG TABS tablet Take 1 tablet (  5 mg total) by mouth 2 (two) times daily. 180 tablet 1   atorvastatin (LIPITOR) 80 MG tablet Take 1 tablet (80 mg total) by mouth daily. 90 tablet 0   azelastine (ASTELIN) 0.1 % nasal spray Place 2 sprays into both nostrils 2 (two) times daily. Use in each nostril as directed 30 mL 12   calcium acetate (PHOSLO) 667 MG capsule Take 1 capsule  (667 mg total) by mouth 3 (three) times daily with meals. 270 capsule 2   carvedilol (COREG) 12.5 MG tablet Take 1 tablet (12.5 mg total) by mouth 2 (two) times daily with a meal. (Take once a day in the evening on hemodialysis days) 180 tablet 1   fluticasone (FLONASE) 50 MCG/ACT nasal spray Place 2 sprays into both nostrils daily. 16 g 12   fluticasone-salmeterol (ADVAIR) 100-50 MCG/ACT AEPB Inhale 1 puff into the lungs 2 (two) times daily. 60 each 2   gabapentin (NEURONTIN) 100 MG capsule Take 1 capsule (100 mg total) by mouth 3 (three) times daily. 270 capsule 1   glipiZIDE (GLUCOTROL XL) 2.5 MG 24 hr tablet Take 1 tablet (2.5 mg total) by mouth daily with breakfast. (needs office visit) (Patient taking differently: Take 2.5 mg by mouth See admin instructions. Take 2.5mg  by mouth once a day. If blood sugar is high, take an additional tablet on the same day.) 90 tablet 0   glucose blood (ACCU-CHEK GUIDE) test strip USE AS INSTRUCTED 2 TIMES DAILY 100 each 2   Homeopathic Products (EARACHE DROPS OT) Place 1 drop into both ears daily as needed (earache).     isosorbide mononitrate (IMDUR) 60 MG 24 hr tablet Take 1 tablet (60 mg total) by mouth daily. 90 tablet 0   lidocaine-prilocaine (EMLA) cream Apply a small amount to dialysis access one hour prior to dialysis. Wrap with plastic wrap. 30 g PRN   linagliptin (TRADJENTA) 5 MG TABS tablet Take 1 tablet (5 mg total) by mouth daily. 90 tablet 3   loratadine (CLARITIN) 10 MG tablet Take 1 tablet (10 mg total) by mouth daily. 90 tablet 1   midodrine (PROAMATINE) 10 MG tablet Take 1 tablet (10 mg total) by mouth 3 (three) times a week. Take 30 minutes prior to HD (Patient taking differently: Take 10 mg by mouth See admin instructions. Take 1 tablet by mouth prior to dialysis if blood pressure is low.) 15 tablet 3   Misc. Devices [employer]  Diagnosis - unstable gait 1 each 0   Misc. Devices MISC Fistula sleeve for R arm.  Diagnoses: End-stage renal disease  on hemodialysis 1 each 0   Misc. Devices MISC 1. Incontinence supplies.  2. Briefs.  Diagnosis-urinary incontinence 1 each 11   Misc. Devices MISC Nepro dialysis protein drink.  Diagnosis end-stage renal disease 30 each 11   Misc. Devices MISC Blood pressure monitor.  Diagnosis hypertension 1 each 0   montelukast (SINGULAIR) 10 MG tablet Take 1 tablet (10 mg total) by mouth at bedtime. 90 tablet 0   olopatadine (PATANOL) 0.1 % ophthalmic solution Place 1 drop into both eyes 2 (two) times daily. 5 mL 12   Throat Lozenges (HALLS COUGH DROPS MT) Use as directed 1 each in the mouth or throat daily as needed (cough).     No current facility-administered medications on file prior to visit.    Allergies  Allergen Reactions   Dust Mite Extract Cough   Peanut-Containing Drug Products Cough   Ace Inhibitors Cough   Insulins  Reports fainting, intolerance   Lovastatin Other (See Comments)    Generalized body pain    Social History   Socioeconomic History   Marital status: Divorced    Spouse name: Not on file   Number of children: 5   Years of education: Not on file   Highest education level: Master's degree (e.g., MA, MS, MEng, MEd, MSW, MBA)  Occupational History   Occupation: Unemployed  Tobacco Use   Smoking status: Never    Passive exposure: Never   Smokeless tobacco: Never  Vaping Use   Vaping status: Never Used  Substance and Sexual Activity   Alcohol use: No   Drug use: No   Sexual activity: Not Currently    Birth control/protection: Post-menopausal, None  Other Topics Concern   Not on file  Social History Narrative   Lives alone.  5 children with two deceased.     Social Drivers of Health   Financial Resource Strain: Medium Risk (11/20/2022)   Overall Financial Resource Strain (CARDIA)    Difficulty of Paying Living Expenses: Somewhat hard  Food Insecurity: Food Insecurity Present (08/17/2023)   Hunger Vital Sign    Worried About Running Out of Food in the Last  Year: Often true    Ran Out of Food in the Last Year: Never true  Transportation Needs: Unmet Transportation Needs (08/17/2023)   PRAPARE - Administrator, Civil Service (Medical): No    Lack of Transportation (Non-Medical): Yes  Physical Activity: Insufficiently Active (11/20/2022)   Exercise Vital Sign    Days of Exercise per Week: 1 day    Minutes of Exercise per Session: 10 min  Stress: No Stress Concern Present (11/20/2022)   [employer] of Occupational Health - Occupational Stress Questionnaire    Feeling of Stress : Only a little  Social Connections: Moderately Isolated (08/17/2023)   Social Connection and Isolation Panel [NHANES]    Frequency of Communication with Friends and Family: More than three times a week    Frequency of Social Gatherings with Friends and Family: Once a week    Attends Religious Services: More than 4 times per year    Active Member of [employer] or Organizations: No    Attends Banker Meetings: Never    Marital Status: Widowed  Intimate Partner Violence: Not At Risk (08/17/2023)   Humiliation, Afraid, Rape, and Kick questionnaire    Fear of Current or Ex-Partner: No    Emotionally Abused: No    Physically Abused: No    Sexually Abused: No    Family History  Problem Relation Age of Onset   Hypertension Mother    Asthma Sister    Colon cancer Neg Hx    Allergic rhinitis Neg Hx    Eczema Neg Hx     Past Surgical History:  Procedure Laterality Date   AV FISTULA PLACEMENT Right 10/15/2021   Procedure: ARTERIOVENOUS (AV) FISTULA  WITH PLACEMENT OF GORE-TEX STRETCH  GRAFT (4-49mmx45cm);  Surgeon: Chuck Hint, MD;  Location[facility] OR;  Service: Vascular;  Laterality: Right;   CATARACT EXTRACTION     right eye   IR 3D INDEPENDENT WKST  12/14/2017   IR ANGIO INTRA EXTRACRAN SEL COM CAROTID INNOMINATE BILAT MOD SED  09/15/2017   IR ANGIO INTRA EXTRACRAN SEL COM CAROTID INNOMINATE UNI L MOD SED  12/14/2017   IR ANGIO VERTEBRAL  SEL VERTEBRAL BILAT MOD SED  09/15/2017   IR RADIOLOGIST EVAL & MGMT  09/10/2017   IR RADIOLOGIST  EVAL & MGMT  10/19/2017   lymphatic mass surgery     NASAL TURBINATE REDUCTION     RADIOLOGY WITH ANESTHESIA N/A 12/14/2017   Procedure: RADIOLOGY WITH ANESTHESIA EMBOLIZATION;  Surgeon: Julieanne Cotton, MD;  Location: [facility] OR;  Service: Radiology;  Laterality: N/A;   VIDEO BRONCHOSCOPY Bilateral 11/18/2017   Procedure: VIDEO BRONCHOSCOPY WITHOUT FLUORO;  Surgeon: Leslye Peer, MD;  Location: [facility] ENDOSCOPY;  Service: Cardiopulmonary;  Laterality: Bilateral;    ROS: Review of Systems Negative except as stated above  PHYSICAL EXAM: BP 119/62 (BP Location: Left Arm, Patient Position: Sitting, Cuff Size: Normal)   Pulse 68   Temp 98.4 F (36.9 C) (Oral)   Ht 5\' 3"  (1.6 m)   Wt 160 lb (72.6 kg)   LMP  (LMP Unknown)   SpO2 100%   BMI 28.34 kg/m   Physical Exam  General appearance - alert, well appearing, elderly female and in no distress Mental status - normal mood, behavior, speech, dress, motor activity, and thought processes Neck - supple, no significant adenopathy Nose: Small amount of dried blood noted at the opening to the left nostril.  Both nasal mucosa very dry. Chest - clear to auscultation, no wheezes, rales or rhonchi, symmetric air entry Heart -heart is regular rate and rhythm, sounds to be in sinus at this time, positive 2/6 SEM left sternal border. Extremities -no lower extremity edema     Latest Ref Rng & Units 08/19/2023    3:25 AM 08/17/2023    8:06 AM 08/16/2023    6:09 PM  CMP  Glucose 70 - 99 mg/dL 607  99    97    BUN 8 - 23 mg/dL 86  85    85    Creatinine 0.44 - 1.00 mg/dL 37.10  62.69    48.54    Sodium 135 - 145 mmol/L 133  136    137  131   Potassium 3.5 - 5.1 mmol/L 4.9  3.5    3.6  3.7   Chloride 98 - 111 mmol/L 89  90    92    CO2 22 - 32 mmol/L 26  27    27     Calcium 8.9 - 10.3 mg/dL 8.5  8.2    8.2     Lipid Panel     Component Value  Date/Time   CHOL 106 09/01/2021 1041   CHOL 121 06/17/2021 0904   TRIG 61 09/01/2021 1041   HDL 46 09/01/2021 1041   HDL 46 06/17/2021 0904   CHOLHDL 2.3 09/01/2021 1041   VLDL 12 09/01/2021 1041   LDLCALC 48 09/01/2021 1041   LDLCALC 61 06/17/2021 0904    CBC    Component Value Date/Time   WBC 9.8 08/19/2023 1053   RBC 3.02 (L) 08/19/2023 1053   HGB 9.3 (L) 08/19/2023 1053   HGB 12.4 09/02/2018 1211   HCT 27.6 (L) 08/19/2023 1053   HCT 37.3 09/02/2018 1211   PLT 221 08/19/2023 1053   PLT 203 09/02/2018 1211   MCV 91.4 08/19/2023 1053   MCV 87 09/02/2018 1211   MCH 30.8 08/19/2023 1053   MCHC 33.7 08/19/2023 1053   RDW 19.2 (H) 08/19/2023 1053   RDW 13.1 09/02/2018 1211   LYMPHSABS 0.8 08/16/2023 1745   LYMPHSABS 1.2 09/02/2018 1211   MONOABS 0.7 08/16/2023 1745   EOSABS 0.1 08/16/2023 1745   EOSABS 0.1 09/02/2018 1211   BASOSABS 0.0 08/16/2023 1745   BASOSABS 0.0 09/02/2018 1211  ASSESSMENT AND PLAN:  1. Hospital discharge follow-up (Primary)  2. Community acquired pneumonia of left lower lobe of lung Patient completed antibiotics in hospital.  She reports that she feels a lot better.  She continues to use inhalers prescribed  3. Epistaxis Nostril appears very dry.  I recommend using some saline nasal spray and a humidifier.  Patient states that she cannot tolerate humidifier.  Advised that if she develops significant nosebleed, she should be seen in the emergency room given that she is on Eliquis.  Will refer to ENT - Ambulatory referral to ENT  4. Chronic diastolic CHF (congestive heart failure) (HCC) Stable and compensated at this time.  5. Diabetes mellitus treated with oral medication (HCC) Just picked up the Linaglipin prescribed by her endocrinologist.  She will stop the glipizide.  6. ESRD on dialysis [facility]) Continue going to dialysis 3 days a week.   Patient was given the opportunity to ask questions.  Patient verbalized understanding of the plan  and was able to repeat key elements of the plan.   This documentation was completed using Paediatric nurse.  Any transcriptional errors are unintentional.  Orders Placed This Encounter  Procedures   Ambulatory referral to ENT     Requested Prescriptions    No prescriptions requested or ordered in this encounter    No follow-ups on file.  Jonah Blue, MD, FACP

## 2023-08-27 NOTE — Transitions of Care (Post Inpatient/ED Visit) (Signed)
   08/27/2023  Name: Paula [name] MRN: [id number] DOB: 11-06-1945  Today's TOC FU Call Status: Unsuccessful Call (3rd Attempt) Date: 08/27/23 (unsuccessful 4th attempt)  Attempted to reach the patient regarding the most recent Inpatient/ED visit.  Follow Up Plan: No further outreach attempts will be made at this time. We have been unable to contact the patient.  Alyse Low, RN, BA, [facility], CRRN [facility] Treatment Coordinator, Transition of Care Ph # [phone]

## 2023-08-28 DIAGNOSIS — N186 End stage renal disease: Secondary | ICD-10-CM | POA: Diagnosis not present

## 2023-08-28 DIAGNOSIS — N2581 Secondary hyperparathyroidism of renal origin: Secondary | ICD-10-CM | POA: Diagnosis not present

## 2023-08-28 DIAGNOSIS — Z992 Dependence on renal dialysis: Secondary | ICD-10-CM | POA: Diagnosis not present

## 2023-08-28 NOTE — Telephone Encounter (Signed)
Noted. She does not need to take glipizide.

## 2023-08-31 DIAGNOSIS — N186 End stage renal disease: Secondary | ICD-10-CM | POA: Diagnosis not present

## 2023-08-31 DIAGNOSIS — N2581 Secondary hyperparathyroidism of renal origin: Secondary | ICD-10-CM | POA: Diagnosis not present

## 2023-08-31 DIAGNOSIS — Z992 Dependence on renal dialysis: Secondary | ICD-10-CM | POA: Diagnosis not present

## 2023-09-01 ENCOUNTER — Ambulatory Visit: Payer: Medicare HMO | Admitting: Cardiology

## 2023-09-01 DIAGNOSIS — J9601 Acute respiratory failure with hypoxia: Secondary | ICD-10-CM | POA: Diagnosis not present

## 2023-09-01 DIAGNOSIS — J189 Pneumonia, unspecified organism: Secondary | ICD-10-CM | POA: Diagnosis not present

## 2023-09-01 DIAGNOSIS — N186 End stage renal disease: Secondary | ICD-10-CM | POA: Diagnosis not present

## 2023-09-01 DIAGNOSIS — Z992 Dependence on renal dialysis: Secondary | ICD-10-CM | POA: Diagnosis not present

## 2023-09-01 DIAGNOSIS — I132 Hypertensive heart and chronic kidney disease with heart failure and with stage 5 chronic kidney disease, or end stage renal disease: Secondary | ICD-10-CM | POA: Diagnosis not present

## 2023-09-01 DIAGNOSIS — E1122 Type 2 diabetes mellitus with diabetic chronic kidney disease: Secondary | ICD-10-CM | POA: Diagnosis not present

## 2023-09-01 DIAGNOSIS — I5031 Acute diastolic (congestive) heart failure: Secondary | ICD-10-CM | POA: Diagnosis not present

## 2023-09-01 DIAGNOSIS — Z7984 Long term (current) use of oral hypoglycemic drugs: Secondary | ICD-10-CM | POA: Diagnosis not present

## 2023-09-01 DIAGNOSIS — E8770 Fluid overload, unspecified: Secondary | ICD-10-CM | POA: Diagnosis not present

## 2023-09-02 ENCOUNTER — Other Ambulatory Visit: Payer: Self-pay | Admitting: Family Medicine

## 2023-09-02 ENCOUNTER — Other Ambulatory Visit: Payer: Self-pay | Admitting: Physician Assistant

## 2023-09-02 ENCOUNTER — Other Ambulatory Visit: Payer: Self-pay

## 2023-09-02 ENCOUNTER — Other Ambulatory Visit (HOSPITAL_COMMUNITY): Payer: Self-pay

## 2023-09-02 DIAGNOSIS — Z992 Dependence on renal dialysis: Secondary | ICD-10-CM | POA: Diagnosis not present

## 2023-09-02 DIAGNOSIS — I10 Essential (primary) hypertension: Secondary | ICD-10-CM

## 2023-09-02 DIAGNOSIS — N2581 Secondary hyperparathyroidism of renal origin: Secondary | ICD-10-CM | POA: Diagnosis not present

## 2023-09-02 DIAGNOSIS — N186 End stage renal disease: Secondary | ICD-10-CM | POA: Diagnosis not present

## 2023-09-02 MED ORDER — LORATADINE 10 MG PO TABS
10.0000 mg | ORAL_TABLET | Freq: Every day | ORAL | 1 refills | Status: DC
  Filled 2023-09-02 – 2023-11-20 (×2): qty 90, 90d supply, fill #0
  Filled 2024-02-13: qty 90, 90d supply, fill #1

## 2023-09-02 MED ORDER — AMLODIPINE BESYLATE 5 MG PO TABS
5.0000 mg | ORAL_TABLET | Freq: Every day | ORAL | 1 refills | Status: DC
  Filled 2023-09-02 – 2023-10-09 (×2): qty 90, 90d supply, fill #0
  Filled 2024-01-20: qty 90, 90d supply, fill #1

## 2023-09-03 ENCOUNTER — Ambulatory Visit: Payer: Medicare HMO | Attending: Nurse Practitioner | Admitting: Nurse Practitioner

## 2023-09-03 ENCOUNTER — Encounter: Payer: Self-pay | Admitting: Nurse Practitioner

## 2023-09-03 VITALS — BP 144/60 | HR 71 | Ht 62.0 in | Wt 164.4 lb

## 2023-09-03 DIAGNOSIS — I480 Paroxysmal atrial fibrillation: Secondary | ICD-10-CM | POA: Diagnosis not present

## 2023-09-03 DIAGNOSIS — I5032 Chronic diastolic (congestive) heart failure: Secondary | ICD-10-CM

## 2023-09-03 DIAGNOSIS — E785 Hyperlipidemia, unspecified: Secondary | ICD-10-CM

## 2023-09-03 DIAGNOSIS — Z7984 Long term (current) use of oral hypoglycemic drugs: Secondary | ICD-10-CM | POA: Diagnosis not present

## 2023-09-03 DIAGNOSIS — I132 Hypertensive heart and chronic kidney disease with heart failure and with stage 5 chronic kidney disease, or end stage renal disease: Secondary | ICD-10-CM | POA: Diagnosis not present

## 2023-09-03 DIAGNOSIS — J189 Pneumonia, unspecified organism: Secondary | ICD-10-CM | POA: Diagnosis not present

## 2023-09-03 DIAGNOSIS — E118 Type 2 diabetes mellitus with unspecified complications: Secondary | ICD-10-CM | POA: Diagnosis not present

## 2023-09-03 DIAGNOSIS — J9601 Acute respiratory failure with hypoxia: Secondary | ICD-10-CM | POA: Diagnosis not present

## 2023-09-03 DIAGNOSIS — I5031 Acute diastolic (congestive) heart failure: Secondary | ICD-10-CM | POA: Diagnosis not present

## 2023-09-03 DIAGNOSIS — E8770 Fluid overload, unspecified: Secondary | ICD-10-CM | POA: Diagnosis not present

## 2023-09-03 DIAGNOSIS — I10 Essential (primary) hypertension: Secondary | ICD-10-CM

## 2023-09-03 DIAGNOSIS — N186 End stage renal disease: Secondary | ICD-10-CM | POA: Diagnosis not present

## 2023-09-03 DIAGNOSIS — I2510 Atherosclerotic heart disease of native coronary artery without angina pectoris: Secondary | ICD-10-CM | POA: Diagnosis not present

## 2023-09-03 DIAGNOSIS — Z992 Dependence on renal dialysis: Secondary | ICD-10-CM | POA: Diagnosis not present

## 2023-09-03 DIAGNOSIS — E1122 Type 2 diabetes mellitus with diabetic chronic kidney disease: Secondary | ICD-10-CM | POA: Diagnosis not present

## 2023-09-03 NOTE — Progress Notes (Signed)
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 Corliss Skains,  hypertension, hyperlipidemia, type 2 diabetes mellitus, ESRD on hemodialysis T/Th/Sat, and GERD who presents for follow-up related to heart failure and atrial fibrillation.   Past Medical History    Past Medical History:  Diagnosis Date   Allergic rhinitis    Arthritis    Asthma    Brain aneurysm    Cough    Diabetes mellitus    Diastolic CHF, chronic (HCC) 10/11/2011   ESRD on hemodialysis (HCC)    MWF   GERD (gastroesophageal reflux disease)    History of colon polyps 2012   tubular adenoma    Hyperlipidemia    Hypertension    Neuropathy 10/11/2011   PONV (postoperative nausea and vomiting)    one time after lymph node surgery   Past Surgical History:  Procedure Laterality Date   AV FISTULA PLACEMENT Right 10/15/2021   Procedure: ARTERIOVENOUS (AV) FISTULA  WITH PLACEMENT OF GORE-TEX STRETCH  GRAFT (4-14mmx45cm);  Surgeon: Chuck Hint, MD;  Location: [facility] OR;  Service: Vascular;  Laterality: Right;   CATARACT EXTRACTION     right eye   IR 3D INDEPENDENT WKST  12/14/2017   IR ANGIO INTRA EXTRACRAN SEL COM CAROTID INNOMINATE BILAT MOD SED  09/15/2017   IR ANGIO INTRA EXTRACRAN SEL COM CAROTID INNOMINATE UNI L MOD SED  12/14/2017   IR ANGIO VERTEBRAL SEL VERTEBRAL BILAT MOD SED  09/15/2017   IR RADIOLOGIST EVAL & MGMT  09/10/2017   IR RADIOLOGIST EVAL & MGMT  10/19/2017   lymphatic mass surgery     NASAL TURBINATE REDUCTION     RADIOLOGY  WITH ANESTHESIA N/A 12/14/2017   Procedure: RADIOLOGY WITH ANESTHESIA EMBOLIZATION;  Surgeon: Julieanne Cotton, MD;  Location: [facility] OR;  Service: Radiology;  Laterality: N/A;   VIDEO BRONCHOSCOPY Bilateral 11/18/2017   Procedure: VIDEO BRONCHOSCOPY WITHOUT FLUORO;  Surgeon: Leslye Peer, MD;  Location: [facility] ENDOSCOPY;  Service: Cardiopulmonary;  Laterality: Bilateral;    Allergies  Allergies  Allergen Reactions   Dust Mite Extract Cough   Peanut-Containing Drug Products Cough   Ace Inhibitors Cough   Insulins     Reports fainting, intolerance   Lovastatin Other (See Comments)    Generalized body pain     Labs/Other Studies Reviewed    The following studies were reviewed today:  Cardiac Studies & Procedures     STRESS TESTS  MYOCARDIAL PERFUSION IMAGING 07/12/2021  Narrative   The patient reported dyspnea and nausea during the stress test. Patient also reported vomitng, hypotension and stomach pains.   ECG is normal. ECG rhythm shows normal sinus rhythm. Resting ECG shows no ST-segment deviation.   There were no arrhythmias during stress. There were no arrhythmias during recovery. ECG was interpretable and without significant changes. The ECG was not diagnostic due to pharmacologic protocol.   Diaphragmatic attenuation artifact was present. Image quality affected due to significant extracardiac activity.   LV perfusion is normal. There is no evidence of ischemia. There is no evidence of infarction.   No evidence of transient ischemic dilation (  TID) noted.   Prior study available for comparison from 05/17/2008.   The study is normal. The study is low risk.  ECHOCARDIOGRAM  ECHOCARDIOGRAM COMPLETE 08/17/2023  Narrative ECHOCARDIOGRAM REPORT    Patient Name:   Paula [name] Date of Exam: 08/17/2023 Medical Rec #:  [id number]           Height:       63.0 in Accession #:    [id number]          Weight:       163.4 lb Date of Birth:  Dec 03, 1945           BSA:          1.774  m Patient Age:    77 years            BP:           117/65 mmHg Patient Gender: F                   HR:           84 bpm. Exam Location:  Inpatient  Procedure: 2D Echo, Cardiac Doppler, Color Doppler and Strain Analysis  Indications:    CHF  History:        Patient has prior history of Echocardiogram examinations, most recent 10/16/2021. CHF, Arrythmias:Atrial Fibrillation; Risk Factors:Hypertension and Diabetes.  Sonographer:    Webb Laws Referring Phys: 49 JARED M GARDNER  IMPRESSIONS   1. Left ventricular ejection fraction, by estimation, is 60 to 65%. Left ventricular ejection fraction by 2D MOD biplane is 62.1 %. The left ventricle has normal function. The left ventricle has no regional wall motion abnormalities. There is severe concentric left ventricular hypertrophy. Left ventricular diastolic function could not be evaluated. 2. Right ventricular systolic function is low normal. The right ventricular size is normal. Tricuspid regurgitation signal is inadequate for assessing PA pressure. 3. Left atrial size was moderately dilated. 4. The mitral valve is abnormal. Trivial mitral valve regurgitation. 5. The aortic valve is tricuspid. Aortic valve regurgitation is trivial. Aortic valve sclerosis/calcification is present, without any evidence of aortic stenosis. 6. The inferior vena cava is normal in size with greater than 50% respiratory variability, suggesting right atrial pressure of 3 mmHg.  Comparison(s): No prior Echocardiogram.  FINDINGS Left Ventricle: Left ventricular ejection fraction, by estimation, is 60 to 65%. Left ventricular ejection fraction by 2D MOD biplane is 62.1 %. The left ventricle has normal function. The left ventricle has no regional wall motion abnormalities. The left ventricular internal cavity size was normal in size. There is severe concentric left ventricular hypertrophy. Left ventricular diastolic function could not be evaluated due to atrial  fibrillation. Left ventricular diastolic function could not be evaluated.  Right Ventricle: The right ventricular size is normal. No increase in right ventricular wall thickness. Right ventricular systolic function is low normal. Tricuspid regurgitation signal is inadequate for assessing PA pressure.  Left Atrium: Left atrial size was moderately dilated.  Right Atrium: Right atrial size was normal in size.  Pericardium: There is no evidence of pericardial effusion.  Mitral Valve: The mitral valve is abnormal. There is mild calcification of the anterior and posterior mitral valve leaflet(s). Trivial mitral valve regurgitation.  Tricuspid Valve: The tricuspid valve is grossly normal. Tricuspid valve regurgitation is trivial.  Aortic Valve: The aortic valve is tricuspid. Aortic valve regurgitation is trivial. Aortic valve sclerosis/calcification is present, without any evidence of aortic stenosis.  Pulmonic Valve: The pulmonic valve was grossly  normal. Pulmonic valve regurgitation is trivial.  Aorta: The aortic root and ascending aorta are structurally normal, with no evidence of dilitation.  Venous: The inferior vena cava is normal in size with greater than 50% respiratory variability, suggesting right atrial pressure of 3 mmHg.  IAS/Shunts: No atrial level shunt detected by color flow Doppler.   LEFT VENTRICLE PLAX 2D                        Biplane EF (MOD) LVIDd:         3.10 cm         LV Biplane EF:   Left LVIDs:         2.30 cm                          ventricular LV PW:         1.80 cm                          ejection LV IVS:        2.00 cm                          fraction by LVOT diam:     2.10 cm                          2D MOD LV SV:         75                               biplane is LV SV Index:   42                               62.1 %. LVOT Area:     3.46 cm Diastology LV e' medial:    6.31 cm/s LV Volumes (MOD)               LV E/e' medial:  23.3 LV vol d, MOD     41.4 ml       LV e' lateral:   8.27 cm/s A2C:                           LV E/e' lateral: 17.8 LV vol d, MOD    41.8 ml A4C: LV vol s, MOD    14.8 ml A2C: LV vol s, MOD    16.2 ml A4C: LV SV MOD A2C:   26.6 ml LV SV MOD A4C:   41.8 ml LV SV MOD BP:    26.9 ml  RIGHT VENTRICLE             IVC RV Basal diam:  3.30 cm     IVC diam: 1.70 cm RV S prime:     10.90 cm/s TAPSE (M-mode): 1.9 cm  LEFT ATRIUM             Index        RIGHT ATRIUM           Index LA diam:        3.90 cm 2.20 cm/m   RA Area:     15.90 cm LA Vol (A2C):   86.5 ml 48.75  ml/m  RA Volume:   35.60 ml  20.06 ml/m LA Vol (A4C):   63.0 ml 35.51 ml/m LA Biplane Vol: 76.8 ml 43.28 ml/m AORTIC VALVE LVOT Vmax:   108.00 cm/s LVOT Vmean:  81.200 cm/s LVOT VTI:    0.216 m  AORTA Ao Root diam: 2.70 cm Ao Asc diam:  3.20 cm  MITRAL VALVE MV Area (PHT): 4.80 cm     SHUNTS MV Decel Time: 158 msec     Systemic VTI:  0.22 m MV E velocity: 147.00 cm/s  Systemic Diam: 2.10 cm  Zoila Shutter MD Electronically signed by Zoila Shutter MD Signature Date/Time: 08/17/2023/11:20:28 AM    Final   MONITORS  CARDIAC EVENT MONITOR 07/10/2016  Narrative Sinus rhythm  No arrhythmia detected. Sensed palpitations correlated with SR.          Recent Labs: 08/16/2023: ALT 27; B Natriuretic Peptide 2,362.8 08/19/2023: BUN 86; Creatinine, Ser 10.39; Hemoglobin 9.3; Platelets 221; Potassium 4.9; Sodium 133  Recent Lipid Panel    Component Value Date/Time   CHOL 106 09/01/2021 1041   CHOL 121 06/17/2021 0904   TRIG 61 09/01/2021 1041   HDL 46 09/01/2021 1041   HDL 46 06/17/2021 0904   CHOLHDL 2.3 09/01/2021 1041   VLDL 12 09/01/2021 1041   LDLCALC 48 09/01/2021 1041   LDLCALC 61 06/17/2021 0904    History of Present Illness   78 year old female with the above past medical history including coronary artery calcifications noted on prior CT scans with negative Myoview in 07/2021, chronic HFpEF, paroxysmal atrial  fibrillation on Eliquis, 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  Patient has been followed intermittently by Cardiology in the past. She has been seen by multiple physicians over the years, most recently Dr. Antoine Poche. She has a long history of palpitations and was ultimately diagnosed with atrial fibrillation. She also has a history of HFpEF. Prior Myoview in 07/2021 was low risk with no evidence of ischemia or prior infarction. PYP scan was ordered around that time as well to rule out cardiac amyloidosis given significant LVH but this was never done. Last Echo in 10/2021 showed LVEF of 70-75% with severe concentric LVH and grade 1 diastolic dysfunction, normal RV function, moderate left atrial enlargement, and no significant valvular disease. She was last seen by Robin Searing, NP, in 05/2022 at which time she was doing well from a cardiac standpoint. She presented to the ED on 07/29/2023 from dialysis for further evaluation of generalized weakness and blurred vision. Code STROKE was called in the field. Head CT and brain MRI showed no acute findings. EKG showed rate controlled atrial fibrillation with no acute ST/T changes. Lab work showed mild hypokalemia and anemia but hemoglobin was around baseline. She was felt to be stable for discharge from the ED. She was hospitalized in January 2025 in the setting of community-acquired pneumonia, acute hypoxic respiratory failure, asthma exacerbation, acute on chronic diastolic heart failure.  She was treated with IV antibiotics, oxygen therapy, and volume removal with hemodialysis.  Echocardiogram showed EF 60 to 65%, normal LV function, no RWMA, severe concentric LVH, trivial mitral valve regurgitation, trivial aortic valve regurgitation, aortic valve  sclerosis without evidence of aortic stenosis, no significant change from prior echo.  She was discharged home in stable condition on 08/20/2023.  She presents today for follow-up accompanied  by her daughter. Since her hospitalization she has been stable from a cardiac standpoint.  She notes some ongoing shortness of breath, generalized fatigue, rare fleeting palpitations. She denies chest pain, dizziness, edema, PND, orthopnea, weight gain.  She is working with home health physical therapy and is gaining strength slowly.  Her appetite has also returned.  Home Medications    Current Outpatient Medications  Medication Sig Dispense Refill   acetaminophen (TYLENOL) 500 MG tablet Take 500 mg by mouth every 6 (six) hours as needed for fever, moderate pain or mild pain.     albuterol (PROAIR HFA) 108 (90 Base) MCG/ACT inhaler Inhale 1-2 puffs into the lungs every 6 (six) hours as needed for wheezing or shortness of breath. 6.7 g 2   amLODipine (NORVASC) 5 MG tablet Take 1 tablet (5 mg total) by mouth daily. 90 tablet 1   apixaban (ELIQUIS) 5 MG TABS tablet Take 1 tablet (5 mg total) by mouth 2 (two) times daily. 180 tablet 1   atorvastatin (LIPITOR) 80 MG tablet Take 1 tablet (80 mg total) by mouth daily. 90 tablet 0   azelastine (ASTELIN) 0.1 % nasal spray Place 2 sprays into both nostrils 2 (two) times daily. Use in each nostril as directed 30 mL 12   calcium acetate (PHOSLO) 667 MG capsule Take 1 capsule (667 mg total) by mouth 3 (three) times daily with meals. 270 capsule 2   carvedilol (COREG) 12.5 MG tablet Take 1 tablet (12.5 mg total) by mouth 2 (two) times daily with a meal. (Take once a day in the evening on hemodialysis days) 180 tablet 1   fluticasone (FLONASE) 50 MCG/ACT nasal spray Place 2 sprays into both nostrils daily. 16 g 12   fluticasone-salmeterol (ADVAIR) 100-50 MCG/ACT AEPB Inhale 1 puff into the lungs 2 (two) times daily. 60 each 2   gabapentin (NEURONTIN) 100 MG capsule Take 1  capsule (100 mg total) by mouth 3 (three) times daily. 270 capsule 1   glipiZIDE (GLUCOTROL XL) 2.5 MG 24 hr tablet Take 1 tablet (2.5 mg total) by mouth daily with breakfast. (needs office visit) (Patient taking differently: Take 2.5 mg by mouth See admin instructions. Take 2.5mg  by mouth once a day. If blood sugar is high, take an additional tablet on the same day.) 90 tablet 0   glucose blood (ACCU-CHEK GUIDE) test strip USE AS INSTRUCTED 2 TIMES DAILY 100 each 2   Homeopathic Products (EARACHE DROPS OT) Place 1 drop into both ears daily as needed (earache).     isosorbide mononitrate (IMDUR) 60 MG 24 hr tablet Take 1 tablet (60 mg total) by mouth daily. 90 tablet 0   lidocaine-prilocaine (EMLA) cream Apply a small amount to dialysis access one hour prior to dialysis. Wrap with plastic wrap. 30 g PRN   linagliptin (TRADJENTA) 5 MG TABS tablet Take 1 tablet (5 mg total) by mouth daily. 90 tablet 3   loratadine (CLARITIN) 10 MG tablet Take 1 tablet (10 mg total) by mouth daily. 90 tablet 1   midodrine (PROAMATINE) 10 MG tablet Take 1 tablet (10 mg total) by mouth 3 (three) times a week. Take 30 minutes prior to HD (Patient taking differently: Take 10 mg by mouth See admin instructions. Take 1 tablet by mouth prior to dialysis if blood pressure is low.) 15 tablet 3   Misc. Devices [employer]  Diagnosis - unstable gait 1 each 0   Misc. Devices MISC Fistula sleeve for R arm.  Diagnoses: End-stage renal disease on  hemodialysis 1 each 0   Misc. Devices MISC 1. Incontinence supplies.  2. Briefs.  Diagnosis-urinary incontinence 1 each 11   Misc. Devices MISC Nepro dialysis protein drink.  Diagnosis end-stage renal disease 30 each 11   Misc. Devices MISC Blood pressure monitor.  Diagnosis hypertension 1 each 0   montelukast (SINGULAIR) 10 MG tablet Take 1 tablet (10 mg total) by mouth at bedtime. 90 tablet 0   olopatadine (PATANOL) 0.1 % ophthalmic solution Place 1 drop into both eyes 2 (two) times daily.  5 mL 12   Throat Lozenges (HALLS COUGH DROPS MT) Use as directed 1 each in the mouth or throat daily as needed (cough).     No current facility-administered medications for this visit.     Review of Systems    She denies chest pain, pnd, orthopnea, n, v, dizziness, syncope, edema, weight gain, or early satiety. All other systems reviewed and are otherwise negative except as noted above.   Physical Exam    VS:  BP (!) 144/60 (BP Location: Left Arm, Patient Position: Sitting, Cuff Size: Normal)   Pulse 71   Ht 5\' 2"  (1.575 m)   Wt 164 lb 6.4 oz (74.6 kg)   LMP  (LMP Unknown)   SpO2 93%   BMI 30.07 kg/m   GEN: Well nourished, well developed, in no acute distress. HEENT: normal. Neck: Supple, no JVD, carotid bruits, or masses. Cardiac: RRR, no murmurs, rubs, or gallops. No clubbing, cyanosis, edema.  Radials/DP/PT 2+ and equal bilaterally.  Respiratory:  Respirations regular and unlabored, clear to auscultation bilaterally. GI: Soft, nontender, nondistended, BS + x 4. MS: no deformity or atrophy. Skin: warm and dry, no rash. Neuro:  Strength and sensation are intact. Psych: Normal affect.  Accessory Clinical Findings    ECG personally reviewed by me today - EKG Interpretation Date/Time:  Thursday September 03 2023 13:40:42 EST Ventricular Rate:  71 PR Interval:  250 QRS Duration:  86 QT Interval:  378 QTC Calculation: 410 R Axis:   -41  Text Interpretation: Sinus rhythm with 1st degree A-V block Left axis deviation Nonspecific T wave abnormality When compared with ECG of 16-Aug-2023 16:47, PREVIOUS ECG IS PRESENT Confirmed by Bernadene Person ([id number]) on 09/03/2023 1:45:54 PM  - no acute changes.   Lab Results  Component Value Date   WBC 9.8 08/19/2023   HGB 9.3 (L) 08/19/2023   HCT 27.6 (L) 08/19/2023   MCV 91.4 08/19/2023   PLT 221 08/19/2023   Lab Results  Component Value Date   CREATININE 10.39 (H) 08/19/2023   BUN 86 (H) 08/19/2023   NA 133 (L) 08/19/2023   K 4.9  08/19/2023   CL 89 (L) 08/19/2023   CO2 26 08/19/2023   Lab Results  Component Value Date   ALT 27 08/16/2023   AST 33 08/16/2023   ALKPHOS 73 08/16/2023   BILITOT 0.9 08/16/2023   Lab Results  Component Value Date   CHOL 106 09/01/2021   HDL 46 09/01/2021   LDLCALC 48 09/01/2021   TRIG 61 09/01/2021   CHOLHDL 2.3 09/01/2021    Lab Results  Component Value Date   HGBA1C 7.4 (H) 08/17/2023    Assessment & Plan    1. Coronary Artery Calcification: She has a history of coronary artery calcifications noted on prior chest CTs. Myoview in 07/2021 was negative for ischemia. Stable with no anginal symptoms. No indication for ischemic evaluation. No aspirin in the setting of chronic anticoagulation. Continue amlodipine, carvedilol, Imdur, Lipitor.  2. Chronic HFpEF: PYP scan was ordered in 2022 for further work-up of severe LVH but patient never completed this.  Echo in 10/2021 showed LVEF of 70-75% with severe concentric LVH and grade 1 diastolic dysfunction, normal RV function, moderate left atrial enlargement, and no significant valvular disease. Most recent echo in 08/2023 showed EF 60 to 65%, normal LV function, no RWMA, severe concentric LVH, trivial mitral valve regurgitation, trivial aortic valve regurgitation, aortic valve sclerosis without evidence of aortic stenosis, no significant change.   Euvolemic and well compensated on exam. Volume management per HD.   3. Paroxysmal atrial fibrillation: EKG today shows sinus rhythm with first-degree AV block. Continue carvedilol, Eliquis.   4. Hypertension: She has a history of hypertension but also a hypotension during dialysis requiring Midodrine. BP stable in office today. Continue current antihypertensive regimen.  5. Hyperlipidemia: LDL was 48 in 08/2021.  Consider repeat labs at follow-up.  Continue Lipitor.   6. Type 2 Diabetes: Hemoglobin A1c was 7.4 in 08/2023.  Following with endocrinology.   7. ESRD on Hemodialysis:  On  hemodialysis T/Th/Sat. Takes Midodrine on dialysis days. Management per Nephrology.   8. Brain Aneurysm: Patient has a history of left posterior cavernous carotid aneurysm and left supraclinoid ICA aneurysm. Stable on most recent head MRA in 09/2022.  Followed by Dr. Corliss Skains.  9. Disposition: Follow-up in 3 months.    Joylene Grapes, NP 09/03/2023, 7:13 PM

## 2023-09-03 NOTE — Patient Instructions (Addendum)
Medication Instructions:  Your physician recommends that you continue on your current medications as directed. Please refer to the Current Medication list given to you today.  *If you need a refill on your cardiac medications before your next appointment, please call your pharmacy*   Lab Work: NONE ordered at this time of appointment     Testing/Procedures: NONE ordered at this time of appointment     Follow-Up: At [facility], you and your health needs are our priority.  As part of our continuing mission to provide you with exceptional heart care, we have created designated Provider Care Teams.  These Care Teams include your primary Cardiologist (physician) and Advanced Practice Providers (APPs -  Physician Assistants and Nurse Practitioners) who all work together to provide you with the care you need, when you need it.  We recommend signing up for the patient portal called "MyChart".  Sign up information is provided on this After Visit Summary.  MyChart is used to connect with patients for Virtual Visits (Telemedicine).  Patients are able to view lab/test results, encounter notes, upcoming appointments, etc.  Non-urgent messages can be sent to your provider as well.   To learn more about what you can do with MyChart, go to [email or web address].    Your next appointment:   3 month(s)  Provider:   Rollene Rotunda, MD

## 2023-09-04 DIAGNOSIS — N2581 Secondary hyperparathyroidism of renal origin: Secondary | ICD-10-CM | POA: Diagnosis not present

## 2023-09-04 DIAGNOSIS — N186 End stage renal disease: Secondary | ICD-10-CM | POA: Diagnosis not present

## 2023-09-04 DIAGNOSIS — Z992 Dependence on renal dialysis: Secondary | ICD-10-CM | POA: Diagnosis not present

## 2023-09-07 DIAGNOSIS — N2581 Secondary hyperparathyroidism of renal origin: Secondary | ICD-10-CM | POA: Diagnosis not present

## 2023-09-07 DIAGNOSIS — N186 End stage renal disease: Secondary | ICD-10-CM | POA: Diagnosis not present

## 2023-09-07 DIAGNOSIS — Z992 Dependence on renal dialysis: Secondary | ICD-10-CM | POA: Diagnosis not present

## 2023-09-08 DIAGNOSIS — N186 End stage renal disease: Secondary | ICD-10-CM | POA: Diagnosis not present

## 2023-09-08 DIAGNOSIS — J189 Pneumonia, unspecified organism: Secondary | ICD-10-CM | POA: Diagnosis not present

## 2023-09-08 DIAGNOSIS — E1122 Type 2 diabetes mellitus with diabetic chronic kidney disease: Secondary | ICD-10-CM | POA: Diagnosis not present

## 2023-09-08 DIAGNOSIS — E8770 Fluid overload, unspecified: Secondary | ICD-10-CM | POA: Diagnosis not present

## 2023-09-08 DIAGNOSIS — J9601 Acute respiratory failure with hypoxia: Secondary | ICD-10-CM | POA: Diagnosis not present

## 2023-09-08 DIAGNOSIS — Z992 Dependence on renal dialysis: Secondary | ICD-10-CM | POA: Diagnosis not present

## 2023-09-08 DIAGNOSIS — I132 Hypertensive heart and chronic kidney disease with heart failure and with stage 5 chronic kidney disease, or end stage renal disease: Secondary | ICD-10-CM | POA: Diagnosis not present

## 2023-09-08 DIAGNOSIS — I5031 Acute diastolic (congestive) heart failure: Secondary | ICD-10-CM | POA: Diagnosis not present

## 2023-09-08 DIAGNOSIS — Z7984 Long term (current) use of oral hypoglycemic drugs: Secondary | ICD-10-CM | POA: Diagnosis not present

## 2023-09-09 DIAGNOSIS — N186 End stage renal disease: Secondary | ICD-10-CM | POA: Diagnosis not present

## 2023-09-09 DIAGNOSIS — Z992 Dependence on renal dialysis: Secondary | ICD-10-CM | POA: Diagnosis not present

## 2023-09-09 DIAGNOSIS — N2581 Secondary hyperparathyroidism of renal origin: Secondary | ICD-10-CM | POA: Diagnosis not present

## 2023-09-10 DIAGNOSIS — J189 Pneumonia, unspecified organism: Secondary | ICD-10-CM | POA: Diagnosis not present

## 2023-09-10 DIAGNOSIS — I132 Hypertensive heart and chronic kidney disease with heart failure and with stage 5 chronic kidney disease, or end stage renal disease: Secondary | ICD-10-CM | POA: Diagnosis not present

## 2023-09-10 DIAGNOSIS — N186 End stage renal disease: Secondary | ICD-10-CM | POA: Diagnosis not present

## 2023-09-10 DIAGNOSIS — Z992 Dependence on renal dialysis: Secondary | ICD-10-CM | POA: Diagnosis not present

## 2023-09-10 DIAGNOSIS — I5031 Acute diastolic (congestive) heart failure: Secondary | ICD-10-CM | POA: Diagnosis not present

## 2023-09-10 DIAGNOSIS — J9601 Acute respiratory failure with hypoxia: Secondary | ICD-10-CM | POA: Diagnosis not present

## 2023-09-10 DIAGNOSIS — E8770 Fluid overload, unspecified: Secondary | ICD-10-CM | POA: Diagnosis not present

## 2023-09-10 DIAGNOSIS — Z7984 Long term (current) use of oral hypoglycemic drugs: Secondary | ICD-10-CM | POA: Diagnosis not present

## 2023-09-10 DIAGNOSIS — E1122 Type 2 diabetes mellitus with diabetic chronic kidney disease: Secondary | ICD-10-CM | POA: Diagnosis not present

## 2023-09-11 DIAGNOSIS — Z992 Dependence on renal dialysis: Secondary | ICD-10-CM | POA: Diagnosis not present

## 2023-09-11 DIAGNOSIS — N186 End stage renal disease: Secondary | ICD-10-CM | POA: Diagnosis not present

## 2023-09-11 DIAGNOSIS — I129 Hypertensive chronic kidney disease with stage 1 through stage 4 chronic kidney disease, or unspecified chronic kidney disease: Secondary | ICD-10-CM | POA: Diagnosis not present

## 2023-09-11 DIAGNOSIS — N2581 Secondary hyperparathyroidism of renal origin: Secondary | ICD-10-CM | POA: Diagnosis not present

## 2023-09-14 DIAGNOSIS — Z992 Dependence on renal dialysis: Secondary | ICD-10-CM | POA: Diagnosis not present

## 2023-09-14 DIAGNOSIS — N186 End stage renal disease: Secondary | ICD-10-CM | POA: Diagnosis not present

## 2023-09-14 DIAGNOSIS — N2581 Secondary hyperparathyroidism of renal origin: Secondary | ICD-10-CM | POA: Diagnosis not present

## 2023-09-15 ENCOUNTER — Encounter: Payer: Self-pay | Admitting: Endocrinology

## 2023-09-15 ENCOUNTER — Other Ambulatory Visit: Payer: Medicare HMO

## 2023-09-15 ENCOUNTER — Ambulatory Visit: Payer: Medicare HMO | Attending: Family Medicine | Admitting: Family Medicine

## 2023-09-15 VITALS — BP 175/68 | HR 70 | Ht 62.0 in | Wt 160.8 lb

## 2023-09-15 DIAGNOSIS — Z5941 Food insecurity: Secondary | ICD-10-CM

## 2023-09-15 DIAGNOSIS — Z992 Dependence on renal dialysis: Secondary | ICD-10-CM | POA: Diagnosis not present

## 2023-09-15 DIAGNOSIS — Z23 Encounter for immunization: Secondary | ICD-10-CM | POA: Diagnosis not present

## 2023-09-15 DIAGNOSIS — Z0000 Encounter for general adult medical examination without abnormal findings: Secondary | ICD-10-CM | POA: Diagnosis not present

## 2023-09-15 DIAGNOSIS — Z5986 Financial insecurity: Secondary | ICD-10-CM | POA: Diagnosis not present

## 2023-09-15 DIAGNOSIS — I129 Hypertensive chronic kidney disease with stage 1 through stage 4 chronic kidney disease, or unspecified chronic kidney disease: Secondary | ICD-10-CM | POA: Diagnosis not present

## 2023-09-15 DIAGNOSIS — Z7984 Long term (current) use of oral hypoglycemic drugs: Secondary | ICD-10-CM

## 2023-09-15 DIAGNOSIS — E8770 Fluid overload, unspecified: Secondary | ICD-10-CM | POA: Diagnosis not present

## 2023-09-15 DIAGNOSIS — J9601 Acute respiratory failure with hypoxia: Secondary | ICD-10-CM | POA: Diagnosis not present

## 2023-09-15 DIAGNOSIS — I5031 Acute diastolic (congestive) heart failure: Secondary | ICD-10-CM | POA: Diagnosis not present

## 2023-09-15 DIAGNOSIS — N186 End stage renal disease: Secondary | ICD-10-CM | POA: Diagnosis not present

## 2023-09-15 DIAGNOSIS — N184 Chronic kidney disease, stage 4 (severe): Secondary | ICD-10-CM | POA: Diagnosis not present

## 2023-09-15 DIAGNOSIS — J189 Pneumonia, unspecified organism: Secondary | ICD-10-CM | POA: Diagnosis not present

## 2023-09-15 DIAGNOSIS — E1122 Type 2 diabetes mellitus with diabetic chronic kidney disease: Secondary | ICD-10-CM | POA: Diagnosis not present

## 2023-09-15 DIAGNOSIS — I132 Hypertensive heart and chronic kidney disease with heart failure and with stage 5 chronic kidney disease, or end stage renal disease: Secondary | ICD-10-CM | POA: Diagnosis not present

## 2023-09-15 NOTE — Patient Instructions (Signed)
  Ms. Kops  , Thank you for taking time to come for your Medicare Wellness Visit. I appreciate your ongoing commitment to your health goals. Please review the following plan we discussed and let me know if I can assist you in the future.   These are the goals we discussed:  Goals      Blood Pressure < 150/90     HEMOGLOBIN A1C < 7        This is a list of the screening recommended for you and due dates:  Health Maintenance  Topic Date Due   Colon Cancer Screening  08/14/2020   COVID-19 Vaccine (2 - Pfizer risk series) 02/09/2021   Complete foot exam   03/05/2022   Zoster (Shingles) Vaccine (1 of 2) 12/13/2023*   Hemoglobin A1C  02/14/2024   Eye exam for diabetics  04/08/2024   Medicare Annual Wellness Visit  09/14/2024   Pneumonia Vaccine  Completed   Flu Shot  Completed   DEXA scan (bone density measurement)  Completed   Hepatitis C Screening  Completed   HPV Vaccine  Aged Out   DTaP/Tdap/Td vaccine  Discontinued  *Topic was postponed. The date shown is not the original due date.

## 2023-09-15 NOTE — Progress Notes (Signed)
 Subjective:   Paula [name]  is a 78 y.o. female who presents for Medicare Annual (Subsequent) preventive examination.  Visit Complete: In person   Cardiac Risk Factors include: advanced age (>60men, >5 women);diabetes mellitus;obesity (BMI >30kg/m2);hypertension   Her blood pressure is elevated today and she endorses taking her antihypertensive. Objective:    Today's Vitals   09/15/23 1550  BP: (!) 182/67  Pulse: 70  SpO2: 93%  Weight: 160 lb 12.8 oz (72.9 kg)  Height: 5' 2 (1.575 m)   Body mass index is 29.41 kg/m.     09/15/2023    3:53 PM 08/17/2023   12:08 PM 07/29/2023    5:17 PM 02/02/2023    9:02 PM 09/09/2022   10:07 AM 03/25/2022   12:52 PM 11/15/2021    2:45 PM  Advanced Directives  Does Patient Have a Medical Advance Directive? Yes Yes Unable to assess, patient is non-responsive or altered mental status No Yes No Yes  Type of Advance Directive Living will Healthcare Power of [place];Living will   Healthcare Power of [place];Living will    Does patient want to make changes to medical advance directive?  Yes (Inpatient - patient defers changing a medical advance directive and declines information at this time)       Copy of Healthcare Power of Attorney in Chart?  No - copy requested         Current Medications (verified) Outpatient Encounter Medications as of 09/15/2023  Medication Sig   acetaminophen  (TYLENOL ) 500 MG tablet Take 500 mg by mouth every 6 (six) hours as needed for fever, moderate pain or mild pain.   albuterol  (PROAIR  HFA) 108 (90 Base) MCG/ACT inhaler Inhale 1-2 puffs into the lungs every 6 (six) hours as needed for wheezing or shortness of breath.   amLODipine  (NORVASC ) 5 MG tablet Take 1 tablet (5 mg total) by mouth daily.   apixaban  (ELIQUIS ) 5 MG TABS tablet Take 1 tablet (5 mg total) by mouth 2 (two) times daily.   atorvastatin  (LIPITOR ) 80 MG tablet Take 1 tablet (80 mg total) by mouth daily.   azelastine  (ASTELIN ) 0.1 % nasal spray  Place 2 sprays into both nostrils 2 (two) times daily. Use in each nostril as directed   calcium  acetate (PHOSLO ) 667 MG capsule Take 1 capsule (667 mg total) by mouth 3 (three) times daily with meals.   carvedilol  (COREG ) 12.5 MG tablet Take 1 tablet (12.5 mg total) by mouth 2 (two) times daily with a meal. (Take once a day in the evening on hemodialysis days)   fluticasone  (FLONASE ) 50 MCG/ACT nasal spray Place 2 sprays into both nostrils daily.   fluticasone -salmeterol (ADVAIR ) 100-50 MCG/ACT AEPB Inhale 1 puff into the lungs 2 (two) times daily.   gabapentin  (NEURONTIN ) 100 MG capsule Take 1 capsule (100 mg total) by mouth 3 (three) times daily.   glipiZIDE  (GLUCOTROL  XL) 2.5 MG 24 hr tablet Take 1 tablet (2.5 mg total) by mouth daily with breakfast. (needs office visit) (Patient taking differently: Take 2.5 mg by mouth See admin instructions. Take 2.5mg  by mouth once a day. If blood sugar is high, take an additional tablet on the same day.)   glucose blood (ACCU-CHEK GUIDE) test strip USE AS INSTRUCTED 2 TIMES DAILY   Homeopathic Products (EARACHE DROPS OT) Place 1 drop into both ears daily as needed (earache).   isosorbide  mononitrate (IMDUR ) 60 MG 24 hr tablet Take 1 tablet (60 mg total) by mouth daily.   lidocaine -prilocaine  (EMLA ) cream Apply a  small amount to dialysis access one hour prior to dialysis. Wrap with plastic wrap.   linagliptin  (TRADJENTA ) 5 MG TABS tablet Take 1 tablet (5 mg total) by mouth daily.   loratadine  (CLARITIN ) 10 MG tablet Take 1 tablet (10 mg total) by mouth daily.   midodrine  (PROAMATINE ) 10 MG tablet Take 1 tablet (10 mg total) by mouth 3 (three) times a week. Take 30 minutes prior to HD (Patient taking differently: Take 10 mg by mouth See admin instructions. Take 1 tablet by mouth prior to dialysis if blood pressure is low.)   Misc. Devices [employer]  Diagnosis - unstable gait   Misc. Devices MISC Fistula sleeve for R arm.  Diagnoses: End-stage renal disease on  hemodialysis   Misc. Devices MISC 1. Incontinence supplies.  2. Briefs.  Diagnosis-urinary incontinence   Misc. Devices MISC Nepro dialysis protein drink.  Diagnosis end-stage renal disease   Misc. Devices MISC Blood pressure monitor.  Diagnosis hypertension   montelukast  (SINGULAIR ) 10 MG tablet Take 1 tablet (10 mg total) by mouth at bedtime.   olopatadine  (PATANOL) 0.1 % ophthalmic solution Place 1 drop into both eyes 2 (two) times daily.   Throat Lozenges (HALLS COUGH DROPS MT) Use as directed 1 each in the mouth or throat daily as needed (cough).   No facility-administered encounter medications on file as of 09/15/2023.    Allergies (verified) Dust mite extract, Peanut-containing drug products, Ace inhibitors, Insulins, and Lovastatin   History: Past Medical History:  Diagnosis Date   Allergic rhinitis    Allergy  1/12000   Arthritis    Asthma    Brain aneurysm    Cough    Diabetes mellitus    Diastolic CHF, chronic (HCC) 10/11/2011   ESRD on hemodialysis (HCC)    MWF   GERD (gastroesophageal reflux disease)    History of colon polyps 2012   tubular adenoma    Hyperlipidemia    Hypertension    Neuropathy 10/11/2011   PONV (postoperative nausea and vomiting)    one time after lymph node surgery   Past Surgical History:  Procedure Laterality Date   AV FISTULA PLACEMENT Right 10/15/2021   Procedure: ARTERIOVENOUS (AV) FISTULA  WITH PLACEMENT OF GORE-TEX STRETCH  GRAFT (4-3mmx45cm);  Surgeon: Eliza Lonni RAMAN, MD;  Location: [facility] OR;  Service: Vascular;  Laterality: Right;   CATARACT EXTRACTION     right eye   IR 3D INDEPENDENT WKST  12/14/2017   IR ANGIO INTRA EXTRACRAN SEL COM CAROTID INNOMINATE BILAT MOD SED  09/15/2017   IR ANGIO INTRA EXTRACRAN SEL COM CAROTID INNOMINATE UNI L MOD SED  12/14/2017   IR ANGIO VERTEBRAL SEL VERTEBRAL BILAT MOD SED  09/15/2017   IR RADIOLOGIST EVAL & MGMT  09/10/2017   IR RADIOLOGIST EVAL & MGMT  10/19/2017   lymphatic mass surgery      NASAL TURBINATE REDUCTION     RADIOLOGY WITH ANESTHESIA N/A 12/14/2017   Procedure: RADIOLOGY WITH ANESTHESIA EMBOLIZATION;  Surgeon: Dolphus Carrion, MD;  Location: [facility] OR;  Service: Radiology;  Laterality: N/A;   VIDEO BRONCHOSCOPY Bilateral 11/18/2017   Procedure: VIDEO BRONCHOSCOPY WITHOUT FLUORO;  Surgeon: Shelah Lamar RAMAN, MD;  Location: [facility];  Service: Cardiopulmonary;  Laterality: Bilateral;   Family History  Problem Relation Age of Onset   Hypertension Mother    Asthma Sister    Colon cancer Neg Hx    Allergic rhinitis Neg Hx    Eczema Neg Hx    Social History   Socioeconomic History  Marital status: Divorced    Spouse name: Not on file   Number of children: 5   Years of education: Not on file   Highest education level: Master's degree (e.g., MA, MS, MEng, MEd, MSW, MBA)  Occupational History   Occupation: Unemployed  Tobacco Use   Smoking status: Never    Passive exposure: Never   Smokeless tobacco: Never  Vaping Use   Vaping status: Never Used  Substance and Sexual Activity   Alcohol  use: No   Drug use: No   Sexual activity: Not Currently    Birth control/protection: Post-menopausal, None  Other Topics Concern   Not on file  Social History Narrative   Lives alone.  5 children with two deceased.     Social Drivers of Health   Financial Resource Strain: High Risk (09/15/2023)   Overall Financial Resource Strain (CARDIA)    Difficulty of Paying Living Expenses: Very hard  Food Insecurity: Food Insecurity Present (09/15/2023)   Hunger Vital Sign    Worried About Running Out of Food in the Last Year: Sometimes true    Ran Out of Food in the Last Year: Sometimes true  Transportation Needs: Unmet Transportation Needs (09/15/2023)   PRAPARE - Administrator, Civil Service (Medical): Yes    Lack of Transportation (Non-Medical): Yes  Physical Activity: Insufficiently Active (09/15/2023)   Exercise Vital Sign    Days of Exercise per Week: 2  days    Minutes of Exercise per Session: 10 min  Stress: No Stress Concern Present (09/15/2023)   [employer] of Occupational Health - Occupational Stress Questionnaire    Feeling of Stress : Only a little  Social Connections: Moderately Integrated (09/15/2023)   Social Connection and Isolation Panel [NHANES]    Frequency of Communication with Friends and Family: More than three times a week    Frequency of Social Gatherings with Friends and Family: More than three times a week    Attends Religious Services: More than 4 times per year    Active Member of [employer] or Organizations: Yes    Attends Banker Meetings: More than 4 times per year    Marital Status: Widowed  Recent Concern: Social Connections - Moderately Isolated (08/17/2023)   Social Connection and Isolation Panel [NHANES]    Frequency of Communication with Friends and Family: More than three times a week    Frequency of Social Gatherings with Friends and Family: Once a week    Attends Religious Services: More than 4 times per year    Active Member of [employer] or Organizations: No    Attends Banker Meetings: Never    Marital Status: Widowed    Tobacco Counseling Counseling given: Not Answered   Clinical Intake:  Pre-visit preparation completed: Yes  Pain : No/denies pain     BMI - recorded: 29.41 Nutritional Status: BMI 25 -29 Overweight Nutritional Risks: None Diabetes: No  How often do you need to have someone help you when you read instructions, pamphlets, or other written materials from your doctor or pharmacy?: 1 - Never  Interpreter Needed?: No      Activities of Daily Living    09/15/2023    3:52 PM 09/15/2023    3:26 PM  In your present state of health, do you have any difficulty performing the following activities:  Hearing? 0 0  Vision? 0 0  Difficulty concentrating or making decisions? 0 0  Walking or climbing stairs? 1 1  Dressing or bathing?  0 0  Doing errands,  shopping? 1 1  Preparing Food and eating ? Y Y  Using the Toilet? N N  In the past six months, have you accidently leaked urine? Y Y  Do you have problems with loss of bowel control? Y Y  Managing your Medications? N N  Managing your Finances? N N  Housekeeping or managing your Housekeeping? CINDERELLA CINDERELLA    Patient Care Team: Delbert Clam, MD as PCP - General (Family Medicine) Lavona Agent, MD as PCP - Cardiology (Cardiology) Brien Belvie BRAVO, MD (Pulmonary Disease) Jesus Oliphant, MD as Referring Physician Center, [facility]  Indicate any recent Medical Services you may have received from other than [facility] providers in the past year (date may be approximate).     Assessment:   This is a routine wellness examination for Paula [name].  Hearing/Vision screen No results found.   Goals Addressed   None    Depression Screen    09/15/2023    3:54 PM 11/20/2022   11:06 AM 08/14/2022   11:23 AM 05/20/2022   10:05 AM 11/15/2021    2:44 PM 11/11/2021   10:36 AM 10/01/2021    9:44 AM  PHQ 2/9 Scores  PHQ - 2 Score 0 0 0 0 0 0 0  PHQ- 9 Score  0 0 5  0 3    Fall Risk    09/15/2023    3:54 PM 09/15/2023    3:26 PM 05/19/2023    4:50 PM 04/16/2023    2:13 PM 01/08/2023   10:27 AM  Fall Risk   Falls in the past year? 0 0 0  0  Number falls in past yr: 0 0 0 0 0  Injury with Fall? 0 0 0 0 0  Risk for fall due to : No Fall Risks  No Fall Risks No Fall Risks No Fall Risks  Follow up Falls evaluation completed  Falls evaluation completed      MEDICARE RISK AT HOME: Medicare Risk at Home Any stairs in or around the home?: Yes If so, are there any without handrails?: No Home free of loose throw rugs in walkways, pet beds, electrical cords, etc?: Yes Adequate lighting in your home to reduce risk of falls?: Yes Life alert?: No Use of a cane, walker or w/c?: No Grab bars in the bathroom?: Yes Shower chair or bench in shower?: Yes Elevated toilet seat or a handicapped toilet?: Yes  TIMED  UP AND GO:  Was the test performed?  No    Cognitive Function:    09/09/2022   10:10 AM  MMSE - Mini Mental State Exam  Orientation to time 4  Orientation to Place 5  Registration 3  Attention/ Calculation 5  Recall 3  Language- name 2 objects 2  Language- repeat 1  Language- follow 3 step command 3  Language- read & follow direction 1  Write a sentence 1  Copy design 1  Total score 29        09/15/2023    3:54 PM 09/09/2022   10:14 AM  6CIT Screen  What Year? 0 points 0 points  What month? 0 points 0 points  What time? 0 points 0 points  Count back from 20 0 points 0 points  Months in reverse 0 points 0 points  Repeat phrase 0 points 0 points  Total Score 0 points 0 points    Immunizations Immunization History  Administered Date(s) Administered   Influenza Split 05/13/2012, 04/12/2015   Influenza  Whole 07/12/2011   Influenza,inj,Quad PF,6+ Mos 05/06/2013, 04/28/2016, 05/07/2017, 06/15/2018, 05/10/2019, 06/06/2021   Influenza-Unspecified 05/19/2022, 05/26/2023   PFIZER(Purple Top)SARS-COV-2 Vaccination 01/19/2021   Pneumococcal Conjugate-13 05/10/2019   Pneumococcal Polysaccharide-23 10/11/2011    Screening Tests Health Maintenance  Topic Date Due   Colonoscopy  08/14/2020   COVID-19 Vaccine (2 - Pfizer risk series) 02/09/2021   FOOT EXAM  03/05/2022   Medicare Annual Wellness (AWV)  09/10/2023   Zoster Vaccines- Shingrix (1 of 2) 12/13/2023 (Originally 10/03/1964)   HEMOGLOBIN A1C  02/14/2024   OPHTHALMOLOGY EXAM  04/08/2024   Pneumonia Vaccine 42+ Years old  Completed   INFLUENZA VACCINE  Completed   DEXA SCAN  Completed   Hepatitis C Screening  Completed   HPV VACCINES  Aged Out   DTaP/Tdap/Td  Discontinued    Health Maintenance  Health Maintenance Due  Topic Date Due   Colonoscopy  08/14/2020   COVID-19 Vaccine (2 - Pfizer risk series) 02/09/2021   FOOT EXAM  03/05/2022   Medicare Annual Wellness (AWV)  09/10/2023    Colorectal cancer  screening: No longer required.   Mammogram status: No longer required due to age.       Additional Screening:  Hepatitis C Screening: does qualify; Completed 2018  Vision Screening: Recommended annual ophthalmology exams for early detection of glaucoma and other disorders of the eye. Is the patient up to date with their annual eye exam?  Yes  Who is the provider or what is the name of the office in which the patient attends annual eye exams? Unknown If pt is not established with a provider, would they like to be referred to a provider to establish care? No .   Dental Screening: Recommended annual dental exams for proper oral hygiene    Community Resource Referral / Chronic Care Management: CRR required this visit?  No   CCM required this visit?  No     Plan:    1. Encounter for Medicare annual wellness exam (Primary) Counseled on regular exercise , healthy eating (including decreased daily intake of saturated fats, cholesterol, added sugars, sodium),  routine healthcare maintenance.   2. Encounter for immunization - Pneumococcal conjugate vaccine 20-valent  3. 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 Will hold off on regimen adjustment as she has had syncope due to hypotension during dialysis sessions She has an upcoming appointment with Endocrine soon at which time her BP will be reassess  I have personally reviewed and noted the following in the patient's chart:   Medical and social history Use of alcohol , tobacco or illicit drugs  Current medications and supplements including opioid prescriptions. Patient is not currently taking opioid prescriptions. Functional ability and status Nutritional status Physical activity Advanced directives List of other physicians Hospitalizations, surgeries, and ER visits in previous 12  months Vitals Screenings to include cognitive, depression, and falls Referrals and appointments  In addition, I have reviewed and discussed with patient certain preventive protocols, quality metrics, and best practice recommendations. A written personalized care plan for preventive services as well as general preventive health recommendations were provided to patient.     Corrina Sabin, MD   09/15/2023   After Visit Summary: (In Person-Printed) AVS printed and given to the patient

## 2023-09-16 DIAGNOSIS — N2581 Secondary hyperparathyroidism of renal origin: Secondary | ICD-10-CM | POA: Diagnosis not present

## 2023-09-16 DIAGNOSIS — Z992 Dependence on renal dialysis: Secondary | ICD-10-CM | POA: Diagnosis not present

## 2023-09-16 DIAGNOSIS — N186 End stage renal disease: Secondary | ICD-10-CM | POA: Diagnosis not present

## 2023-09-16 NOTE — Telephone Encounter (Signed)
**Note De-identified  [place] Obfuscation** Please advise 

## 2023-09-16 NOTE — Telephone Encounter (Signed)
 Blood glucose data reviewed as follows in January 20 - January 29  Morning blood sugar 167, 178, 190, 140, 158, 156, 178, 187, 185  Afternoon/evening blood sugar 178, 157, 183, 240, 304.  Patient is still having hyperglycemia especially in the afternoon and evening.  She is currently taking linagliptin  5 mg daily.  I would recommend to restart glipizide  XR 2.5 mg 1 tablet daily.  Will review further on coming follow-up visit in February 25.

## 2023-09-17 ENCOUNTER — Inpatient Hospital Stay: Payer: Medicare HMO | Admitting: Family Medicine

## 2023-09-17 DIAGNOSIS — Z992 Dependence on renal dialysis: Secondary | ICD-10-CM | POA: Diagnosis not present

## 2023-09-17 DIAGNOSIS — I132 Hypertensive heart and chronic kidney disease with heart failure and with stage 5 chronic kidney disease, or end stage renal disease: Secondary | ICD-10-CM | POA: Diagnosis not present

## 2023-09-17 DIAGNOSIS — E8770 Fluid overload, unspecified: Secondary | ICD-10-CM | POA: Diagnosis not present

## 2023-09-17 DIAGNOSIS — J189 Pneumonia, unspecified organism: Secondary | ICD-10-CM | POA: Diagnosis not present

## 2023-09-17 DIAGNOSIS — J9601 Acute respiratory failure with hypoxia: Secondary | ICD-10-CM | POA: Diagnosis not present

## 2023-09-17 DIAGNOSIS — Z7984 Long term (current) use of oral hypoglycemic drugs: Secondary | ICD-10-CM | POA: Diagnosis not present

## 2023-09-17 DIAGNOSIS — N186 End stage renal disease: Secondary | ICD-10-CM | POA: Diagnosis not present

## 2023-09-17 DIAGNOSIS — I5031 Acute diastolic (congestive) heart failure: Secondary | ICD-10-CM | POA: Diagnosis not present

## 2023-09-17 DIAGNOSIS — E1122 Type 2 diabetes mellitus with diabetic chronic kidney disease: Secondary | ICD-10-CM | POA: Diagnosis not present

## 2023-09-18 DIAGNOSIS — N2581 Secondary hyperparathyroidism of renal origin: Secondary | ICD-10-CM | POA: Diagnosis not present

## 2023-09-18 DIAGNOSIS — N186 End stage renal disease: Secondary | ICD-10-CM | POA: Diagnosis not present

## 2023-09-18 DIAGNOSIS — Z992 Dependence on renal dialysis: Secondary | ICD-10-CM | POA: Diagnosis not present

## 2023-09-21 ENCOUNTER — Encounter (HOSPITAL_COMMUNITY): Payer: Self-pay

## 2023-09-21 DIAGNOSIS — N2581 Secondary hyperparathyroidism of renal origin: Secondary | ICD-10-CM | POA: Diagnosis not present

## 2023-09-21 DIAGNOSIS — N186 End stage renal disease: Secondary | ICD-10-CM | POA: Diagnosis not present

## 2023-09-21 DIAGNOSIS — Z992 Dependence on renal dialysis: Secondary | ICD-10-CM | POA: Diagnosis not present

## 2023-09-22 DIAGNOSIS — J189 Pneumonia, unspecified organism: Secondary | ICD-10-CM | POA: Diagnosis not present

## 2023-09-22 DIAGNOSIS — J9601 Acute respiratory failure with hypoxia: Secondary | ICD-10-CM | POA: Diagnosis not present

## 2023-09-22 DIAGNOSIS — Z992 Dependence on renal dialysis: Secondary | ICD-10-CM | POA: Diagnosis not present

## 2023-09-22 DIAGNOSIS — E1122 Type 2 diabetes mellitus with diabetic chronic kidney disease: Secondary | ICD-10-CM | POA: Diagnosis not present

## 2023-09-22 DIAGNOSIS — I132 Hypertensive heart and chronic kidney disease with heart failure and with stage 5 chronic kidney disease, or end stage renal disease: Secondary | ICD-10-CM | POA: Diagnosis not present

## 2023-09-22 DIAGNOSIS — E8770 Fluid overload, unspecified: Secondary | ICD-10-CM | POA: Diagnosis not present

## 2023-09-22 DIAGNOSIS — Z7984 Long term (current) use of oral hypoglycemic drugs: Secondary | ICD-10-CM | POA: Diagnosis not present

## 2023-09-22 DIAGNOSIS — I5031 Acute diastolic (congestive) heart failure: Secondary | ICD-10-CM | POA: Diagnosis not present

## 2023-09-22 DIAGNOSIS — N186 End stage renal disease: Secondary | ICD-10-CM | POA: Diagnosis not present

## 2023-09-23 DIAGNOSIS — N2581 Secondary hyperparathyroidism of renal origin: Secondary | ICD-10-CM | POA: Diagnosis not present

## 2023-09-23 DIAGNOSIS — N186 End stage renal disease: Secondary | ICD-10-CM | POA: Diagnosis not present

## 2023-09-23 DIAGNOSIS — Z992 Dependence on renal dialysis: Secondary | ICD-10-CM | POA: Diagnosis not present

## 2023-09-25 DIAGNOSIS — Z992 Dependence on renal dialysis: Secondary | ICD-10-CM | POA: Diagnosis not present

## 2023-09-25 DIAGNOSIS — N186 End stage renal disease: Secondary | ICD-10-CM | POA: Diagnosis not present

## 2023-09-25 DIAGNOSIS — N2581 Secondary hyperparathyroidism of renal origin: Secondary | ICD-10-CM | POA: Diagnosis not present

## 2023-09-28 DIAGNOSIS — N2581 Secondary hyperparathyroidism of renal origin: Secondary | ICD-10-CM | POA: Diagnosis not present

## 2023-09-28 DIAGNOSIS — N186 End stage renal disease: Secondary | ICD-10-CM | POA: Diagnosis not present

## 2023-09-28 DIAGNOSIS — Z992 Dependence on renal dialysis: Secondary | ICD-10-CM | POA: Diagnosis not present

## 2023-09-30 DIAGNOSIS — Z992 Dependence on renal dialysis: Secondary | ICD-10-CM | POA: Diagnosis not present

## 2023-09-30 DIAGNOSIS — N186 End stage renal disease: Secondary | ICD-10-CM | POA: Diagnosis not present

## 2023-09-30 DIAGNOSIS — N2581 Secondary hyperparathyroidism of renal origin: Secondary | ICD-10-CM | POA: Diagnosis not present

## 2023-10-01 ENCOUNTER — Other Ambulatory Visit (HOSPITAL_COMMUNITY): Payer: Self-pay | Admitting: Interventional Radiology

## 2023-10-01 ENCOUNTER — Other Ambulatory Visit (HOSPITAL_COMMUNITY): Payer: Self-pay

## 2023-10-01 DIAGNOSIS — I671 Cerebral aneurysm, nonruptured: Secondary | ICD-10-CM

## 2023-10-02 DIAGNOSIS — N2581 Secondary hyperparathyroidism of renal origin: Secondary | ICD-10-CM | POA: Diagnosis not present

## 2023-10-02 DIAGNOSIS — Z992 Dependence on renal dialysis: Secondary | ICD-10-CM | POA: Diagnosis not present

## 2023-10-02 DIAGNOSIS — N186 End stage renal disease: Secondary | ICD-10-CM | POA: Diagnosis not present

## 2023-10-05 DIAGNOSIS — Z992 Dependence on renal dialysis: Secondary | ICD-10-CM | POA: Diagnosis not present

## 2023-10-05 DIAGNOSIS — N186 End stage renal disease: Secondary | ICD-10-CM | POA: Diagnosis not present

## 2023-10-05 DIAGNOSIS — N2581 Secondary hyperparathyroidism of renal origin: Secondary | ICD-10-CM | POA: Diagnosis not present

## 2023-10-06 ENCOUNTER — Ambulatory Visit: Payer: Medicare HMO | Admitting: Endocrinology

## 2023-10-07 DIAGNOSIS — N2581 Secondary hyperparathyroidism of renal origin: Secondary | ICD-10-CM | POA: Diagnosis not present

## 2023-10-07 DIAGNOSIS — Z992 Dependence on renal dialysis: Secondary | ICD-10-CM | POA: Diagnosis not present

## 2023-10-07 DIAGNOSIS — N186 End stage renal disease: Secondary | ICD-10-CM | POA: Diagnosis not present

## 2023-10-08 DIAGNOSIS — J189 Pneumonia, unspecified organism: Secondary | ICD-10-CM | POA: Diagnosis not present

## 2023-10-08 DIAGNOSIS — E1122 Type 2 diabetes mellitus with diabetic chronic kidney disease: Secondary | ICD-10-CM | POA: Diagnosis not present

## 2023-10-08 DIAGNOSIS — Z992 Dependence on renal dialysis: Secondary | ICD-10-CM | POA: Diagnosis not present

## 2023-10-08 DIAGNOSIS — E8770 Fluid overload, unspecified: Secondary | ICD-10-CM | POA: Diagnosis not present

## 2023-10-08 DIAGNOSIS — I132 Hypertensive heart and chronic kidney disease with heart failure and with stage 5 chronic kidney disease, or end stage renal disease: Secondary | ICD-10-CM | POA: Diagnosis not present

## 2023-10-08 DIAGNOSIS — I5031 Acute diastolic (congestive) heart failure: Secondary | ICD-10-CM | POA: Diagnosis not present

## 2023-10-08 DIAGNOSIS — N186 End stage renal disease: Secondary | ICD-10-CM | POA: Diagnosis not present

## 2023-10-08 DIAGNOSIS — J9601 Acute respiratory failure with hypoxia: Secondary | ICD-10-CM | POA: Diagnosis not present

## 2023-10-08 DIAGNOSIS — Z7984 Long term (current) use of oral hypoglycemic drugs: Secondary | ICD-10-CM | POA: Diagnosis not present

## 2023-10-09 ENCOUNTER — Other Ambulatory Visit (HOSPITAL_COMMUNITY): Payer: Self-pay

## 2023-10-09 ENCOUNTER — Other Ambulatory Visit: Payer: Self-pay

## 2023-10-09 ENCOUNTER — Other Ambulatory Visit: Payer: Self-pay | Admitting: Physician Assistant

## 2023-10-09 DIAGNOSIS — Z992 Dependence on renal dialysis: Secondary | ICD-10-CM | POA: Diagnosis not present

## 2023-10-09 DIAGNOSIS — N186 End stage renal disease: Secondary | ICD-10-CM | POA: Diagnosis not present

## 2023-10-09 DIAGNOSIS — I651 Occlusion and stenosis of basilar artery: Secondary | ICD-10-CM

## 2023-10-09 DIAGNOSIS — I129 Hypertensive chronic kidney disease with stage 1 through stage 4 chronic kidney disease, or unspecified chronic kidney disease: Secondary | ICD-10-CM | POA: Diagnosis not present

## 2023-10-09 DIAGNOSIS — N2581 Secondary hyperparathyroidism of renal origin: Secondary | ICD-10-CM | POA: Diagnosis not present

## 2023-10-09 MED ORDER — CARVEDILOL 12.5 MG PO TABS
12.5000 mg | ORAL_TABLET | Freq: Two times a day (BID) | ORAL | 0 refills | Status: DC
  Filled 2023-10-09: qty 180, 90d supply, fill #0

## 2023-10-11 ENCOUNTER — Encounter (HOSPITAL_COMMUNITY): Payer: Self-pay

## 2023-10-11 ENCOUNTER — Inpatient Hospital Stay (HOSPITAL_COMMUNITY)
Admission: EM | Admit: 2023-10-11 | Discharge: 2023-10-13 | DRG: 189 | Disposition: A | Discharge status: Home Health | Attending: Internal Medicine | Admitting: Internal Medicine

## 2023-10-11 ENCOUNTER — Observation Stay (HOSPITAL_COMMUNITY)

## 2023-10-11 ENCOUNTER — Emergency Department (HOSPITAL_COMMUNITY)

## 2023-10-11 ENCOUNTER — Other Ambulatory Visit: Payer: Self-pay

## 2023-10-11 DIAGNOSIS — E785 Hyperlipidemia, unspecified: Secondary | ICD-10-CM | POA: Diagnosis present

## 2023-10-11 DIAGNOSIS — I671 Cerebral aneurysm, nonruptured: Secondary | ICD-10-CM | POA: Diagnosis present

## 2023-10-11 DIAGNOSIS — H532 Diplopia: Secondary | ICD-10-CM | POA: Diagnosis present

## 2023-10-11 DIAGNOSIS — Z888 Allergy status to other drugs, medicaments and biological substances status: Secondary | ICD-10-CM

## 2023-10-11 DIAGNOSIS — Z825 Family history of asthma and other chronic lower respiratory diseases: Secondary | ICD-10-CM

## 2023-10-11 DIAGNOSIS — J9811 Atelectasis: Secondary | ICD-10-CM | POA: Diagnosis present

## 2023-10-11 DIAGNOSIS — Z860100 Personal history of colon polyps, unspecified: Secondary | ICD-10-CM

## 2023-10-11 DIAGNOSIS — E1142 Type 2 diabetes mellitus with diabetic polyneuropathy: Secondary | ICD-10-CM | POA: Diagnosis present

## 2023-10-11 DIAGNOSIS — R059 Cough, unspecified: Secondary | ICD-10-CM | POA: Diagnosis present

## 2023-10-11 DIAGNOSIS — Z1152 Encounter for screening for COVID-19: Secondary | ICD-10-CM

## 2023-10-11 DIAGNOSIS — H538 Other visual disturbances: Secondary | ICD-10-CM | POA: Diagnosis present

## 2023-10-11 DIAGNOSIS — I5043 Acute on chronic combined systolic (congestive) and diastolic (congestive) heart failure: Secondary | ICD-10-CM | POA: Diagnosis present

## 2023-10-11 DIAGNOSIS — Z8249 Family history of ischemic heart disease and other diseases of the circulatory system: Secondary | ICD-10-CM

## 2023-10-11 DIAGNOSIS — R062 Wheezing: Secondary | ICD-10-CM | POA: Diagnosis not present

## 2023-10-11 DIAGNOSIS — J40 Bronchitis, not specified as acute or chronic: Secondary | ICD-10-CM | POA: Diagnosis present

## 2023-10-11 DIAGNOSIS — Z7901 Long term (current) use of anticoagulants: Secondary | ICD-10-CM | POA: Diagnosis not present

## 2023-10-11 DIAGNOSIS — Z7951 Long term (current) use of inhaled steroids: Secondary | ICD-10-CM

## 2023-10-11 DIAGNOSIS — I272 Pulmonary hypertension, unspecified: Secondary | ICD-10-CM | POA: Diagnosis present

## 2023-10-11 DIAGNOSIS — I132 Hypertensive heart and chronic kidney disease with heart failure and with stage 5 chronic kidney disease, or end stage renal disease: Secondary | ICD-10-CM | POA: Diagnosis present

## 2023-10-11 DIAGNOSIS — E119 Type 2 diabetes mellitus without complications: Secondary | ICD-10-CM

## 2023-10-11 DIAGNOSIS — I5032 Chronic diastolic (congestive) heart failure: Secondary | ICD-10-CM | POA: Diagnosis not present

## 2023-10-11 DIAGNOSIS — J209 Acute bronchitis, unspecified: Secondary | ICD-10-CM | POA: Diagnosis not present

## 2023-10-11 DIAGNOSIS — D631 Anemia in chronic kidney disease: Secondary | ICD-10-CM | POA: Diagnosis present

## 2023-10-11 DIAGNOSIS — K219 Gastro-esophageal reflux disease without esophagitis: Secondary | ICD-10-CM | POA: Diagnosis present

## 2023-10-11 DIAGNOSIS — R739 Hyperglycemia, unspecified: Secondary | ICD-10-CM | POA: Diagnosis not present

## 2023-10-11 DIAGNOSIS — I700 Atherosclerosis of aorta: Secondary | ICD-10-CM | POA: Diagnosis not present

## 2023-10-11 DIAGNOSIS — Z7984 Long term (current) use of oral hypoglycemic drugs: Secondary | ICD-10-CM

## 2023-10-11 DIAGNOSIS — R918 Other nonspecific abnormal finding of lung field: Secondary | ICD-10-CM | POA: Diagnosis not present

## 2023-10-11 DIAGNOSIS — J849 Interstitial pulmonary disease, unspecified: Secondary | ICD-10-CM | POA: Diagnosis present

## 2023-10-11 DIAGNOSIS — N186 End stage renal disease: Secondary | ICD-10-CM | POA: Diagnosis present

## 2023-10-11 DIAGNOSIS — E669 Obesity, unspecified: Secondary | ICD-10-CM | POA: Diagnosis present

## 2023-10-11 DIAGNOSIS — R053 Chronic cough: Secondary | ICD-10-CM | POA: Diagnosis not present

## 2023-10-11 DIAGNOSIS — N2581 Secondary hyperparathyroidism of renal origin: Secondary | ICD-10-CM | POA: Diagnosis present

## 2023-10-11 DIAGNOSIS — E1122 Type 2 diabetes mellitus with diabetic chronic kidney disease: Secondary | ICD-10-CM | POA: Diagnosis present

## 2023-10-11 DIAGNOSIS — I480 Paroxysmal atrial fibrillation: Secondary | ICD-10-CM | POA: Diagnosis present

## 2023-10-11 DIAGNOSIS — Z992 Dependence on renal dialysis: Secondary | ICD-10-CM | POA: Diagnosis not present

## 2023-10-11 DIAGNOSIS — Z6829 Body mass index (BMI) 29.0-29.9, adult: Secondary | ICD-10-CM

## 2023-10-11 DIAGNOSIS — E1140 Type 2 diabetes mellitus with diabetic neuropathy, unspecified: Secondary | ICD-10-CM | POA: Diagnosis present

## 2023-10-11 DIAGNOSIS — I509 Heart failure, unspecified: Secondary | ICD-10-CM

## 2023-10-11 DIAGNOSIS — I10 Essential (primary) hypertension: Secondary | ICD-10-CM | POA: Diagnosis present

## 2023-10-11 DIAGNOSIS — Z79899 Other long term (current) drug therapy: Secondary | ICD-10-CM

## 2023-10-11 DIAGNOSIS — R509 Fever, unspecified: Secondary | ICD-10-CM | POA: Diagnosis not present

## 2023-10-11 DIAGNOSIS — Z8701 Personal history of pneumonia (recurrent): Secondary | ICD-10-CM | POA: Diagnosis not present

## 2023-10-11 DIAGNOSIS — R0602 Shortness of breath: Secondary | ICD-10-CM | POA: Diagnosis not present

## 2023-10-11 DIAGNOSIS — I120 Hypertensive chronic kidney disease with stage 5 chronic kidney disease or end stage renal disease: Secondary | ICD-10-CM | POA: Diagnosis not present

## 2023-10-11 DIAGNOSIS — I517 Cardiomegaly: Secondary | ICD-10-CM | POA: Diagnosis not present

## 2023-10-11 DIAGNOSIS — I2510 Atherosclerotic heart disease of native coronary artery without angina pectoris: Secondary | ICD-10-CM | POA: Diagnosis present

## 2023-10-11 DIAGNOSIS — J9601 Acute respiratory failure with hypoxia: Principal | ICD-10-CM | POA: Diagnosis present

## 2023-10-11 DIAGNOSIS — N189 Chronic kidney disease, unspecified: Secondary | ICD-10-CM | POA: Diagnosis present

## 2023-10-11 HISTORY — DX: Anemia in chronic kidney disease: D63.1

## 2023-10-11 HISTORY — DX: Other visual disturbances: H53.8

## 2023-10-11 LAB — COMPREHENSIVE METABOLIC PANEL
ALT: 16 U/L (ref 0–44)
AST: 16 U/L (ref 15–41)
Albumin: 3.5 g/dL (ref 3.5–5.0)
Alkaline Phosphatase: 89 U/L (ref 38–126)
Anion gap: 11 (ref 5–15)
BUN: 54 mg/dL — ABNORMAL HIGH (ref 8–23)
CO2: 29 mmol/L (ref 22–32)
Calcium: 9.1 mg/dL (ref 8.9–10.3)
Chloride: 93 mmol/L — ABNORMAL LOW (ref 98–111)
Creatinine, Ser: 6.94 mg/dL — ABNORMAL HIGH (ref 0.44–1.00)
GFR, Estimated: 6 mL/min — ABNORMAL LOW (ref >60)
Glucose, Bld: 191 mg/dL — ABNORMAL HIGH (ref 70–99)
Potassium: 3.5 mmol/L (ref 3.5–5.1)
Sodium: 133 mmol/L — ABNORMAL LOW (ref 135–145)
Total Bilirubin: 0.6 mg/dL (ref 0.0–1.2)
Total Protein: 7.1 g/dL (ref 6.5–8.1)

## 2023-10-11 LAB — CBC WITH DIFFERENTIAL/PLATELET
Abs Immature Granulocytes: 0.04 10*3/uL (ref 0.00–0.07)
Basophils Absolute: 0 10*3/uL (ref 0.0–0.1)
Basophils Relative: 1 %
Eosinophils Absolute: 0.4 10*3/uL (ref 0.0–0.5)
Eosinophils Relative: 6 %
HCT: 30.8 % — ABNORMAL LOW (ref 36.0–46.0)
Hemoglobin: 10.3 g/dL — ABNORMAL LOW (ref 12.0–15.0)
Immature Granulocytes: 1 %
Lymphocytes Relative: 16 %
Lymphs Abs: 1.1 10*3/uL (ref 0.7–4.0)
MCH: 29.8 pg (ref 26.0–34.0)
MCHC: 33.4 g/dL (ref 30.0–36.0)
MCV: 89 fL (ref 80.0–100.0)
Monocytes Absolute: 0.7 10*3/uL (ref 0.1–1.0)
Monocytes Relative: 9 %
Neutro Abs: 4.8 10*3/uL (ref 1.7–7.7)
Neutrophils Relative %: 67 %
Platelets: 183 10*3/uL (ref 150–400)
RBC: 3.46 MIL/uL — ABNORMAL LOW (ref 3.87–5.11)
RDW: 17.2 % — ABNORMAL HIGH (ref 11.5–15.5)
WBC: 7.1 10*3/uL (ref 4.0–10.5)
nRBC: 0 % (ref 0.0–0.2)

## 2023-10-11 LAB — RESPIRATORY PANEL BY PCR

## 2023-10-11 LAB — BLOOD GAS, VENOUS
Acid-Base Excess: 4.9 mmol/L — ABNORMAL HIGH (ref 0.0–2.0)
Bicarbonate: 31.4 mmol/L — ABNORMAL HIGH (ref 20.0–28.0)
O2 Saturation: 74.6 %
Patient temperature: 37
pCO2, Ven: 53 mmHg (ref 44–60)
pH, Ven: 7.38 (ref 7.25–7.43)
pO2, Ven: 45 mmHg (ref 32–45)

## 2023-10-11 LAB — RESP PANEL BY RT-PCR (RSV, FLU A&B, COVID)  RVPGX2
Influenza A by PCR: NEGATIVE
Influenza B by PCR: NEGATIVE
Resp Syncytial Virus by PCR: NEGATIVE
SARS Coronavirus 2 by RT PCR: NEGATIVE

## 2023-10-11 LAB — CBG MONITORING, ED
Glucose-Capillary: 186 mg/dL — ABNORMAL HIGH (ref 70–99)
Glucose-Capillary: 258 mg/dL — ABNORMAL HIGH (ref 70–99)

## 2023-10-11 LAB — I-STAT ARTERIAL BLOOD GAS, ED
Acid-Base Excess: 3 mmol/L — ABNORMAL HIGH (ref 0.0–2.0)
Bicarbonate: 27.9 mmol/L (ref 20.0–28.0)
Calcium, Ion: 1.13 mmol/L — ABNORMAL LOW (ref 1.15–1.40)
HCT: 28 % — ABNORMAL LOW (ref 36.0–46.0)
Hemoglobin: 9.5 g/dL — ABNORMAL LOW (ref 12.0–15.0)
O2 Saturation: 87 %
Patient temperature: 98.2
Potassium: 3.6 mmol/L (ref 3.5–5.1)
Sodium: 132 mmol/L — ABNORMAL LOW (ref 135–145)
TCO2: 29 mmol/L (ref 22–32)
pCO2 arterial: 42.5 mmHg (ref 32–48)
pH, Arterial: 7.425 (ref 7.35–7.45)
pO2, Arterial: 51 mmHg — ABNORMAL LOW (ref 83–108)

## 2023-10-11 LAB — BRAIN NATRIURETIC PEPTIDE: B Natriuretic Peptide: 827.8 pg/mL — ABNORMAL HIGH (ref 0.0–100.0)

## 2023-10-11 MED ORDER — ACETAMINOPHEN 325 MG PO TABS: 650.0000 mg | ORAL_TABLET | Freq: Four times a day (QID) | ORAL | Status: DC | PRN

## 2023-10-11 MED ORDER — SODIUM CHLORIDE 0.9% FLUSH: 3.0000 mL | INTRAVENOUS | Status: DC | PRN

## 2023-10-11 MED ORDER — SODIUM CHLORIDE 0.9% FLUSH
3.0000 mL | Freq: Two times a day (BID) | INTRAVENOUS | Status: DC
  Administered 2023-10-11 – 2023-10-13 (×3): 3 mL via INTRAVENOUS

## 2023-10-11 MED ORDER — ACETAMINOPHEN 650 MG RE SUPP: 650.0000 mg | Freq: Four times a day (QID) | RECTAL | Status: DC | PRN

## 2023-10-11 MED ORDER — GLIPIZIDE ER 2.5 MG PO TB24
2.5000 mg | ORAL_TABLET | Freq: Once | ORAL | Status: AC
  Administered 2023-10-11: 2.5 mg via ORAL
  Filled 2023-10-11: qty 1

## 2023-10-11 MED ORDER — MOMETASONE FURO-FORMOTEROL FUM 100-5 MCG/ACT IN AERO
2.0000 | INHALATION_SPRAY | Freq: Two times a day (BID) | RESPIRATORY_TRACT | Status: DC
  Administered 2023-10-13: 2 via RESPIRATORY_TRACT
  Filled 2023-10-11: qty 8.8

## 2023-10-11 MED ORDER — INSULIN ASPART 100 UNIT/ML IJ SOLN: 0.0000 [IU] | INTRAMUSCULAR | Status: DC

## 2023-10-11 MED ORDER — LORAZEPAM 0.5 MG PO TABS
0.5000 mg | ORAL_TABLET | Freq: Once | ORAL | Status: DC
  Filled 2023-10-11: qty 1

## 2023-10-11 MED ORDER — AMLODIPINE BESYLATE 5 MG PO TABS: 5.0000 mg | ORAL_TABLET | Freq: Every day | ORAL | Status: DC

## 2023-10-11 MED ORDER — ALBUTEROL SULFATE (2.5 MG/3ML) 0.083% IN NEBU: 2.5000 mg | INHALATION_SOLUTION | RESPIRATORY_TRACT | Status: DC | PRN

## 2023-10-11 MED ORDER — CARVEDILOL 12.5 MG PO TABS: 12.5000 mg | ORAL_TABLET | Freq: Two times a day (BID) | ORAL | Status: DC

## 2023-10-11 MED ORDER — GUAIFENESIN ER 600 MG PO TB12
600.0000 mg | ORAL_TABLET | Freq: Two times a day (BID) | ORAL | Status: DC
  Administered 2023-10-11 – 2023-10-13 (×4): 600 mg via ORAL
  Filled 2023-10-11 (×4): qty 1

## 2023-10-11 MED ORDER — APIXABAN 5 MG PO TABS: 5.0000 mg | ORAL_TABLET | Freq: Two times a day (BID) | ORAL | Status: DC

## 2023-10-11 MED ORDER — ATORVASTATIN CALCIUM 80 MG PO TABS
80.0000 mg | ORAL_TABLET | Freq: Every day | ORAL | Status: DC
  Administered 2023-10-12 – 2023-10-13 (×2): 80 mg via ORAL
  Filled 2023-10-11: qty 1
  Filled 2023-10-11: qty 2

## 2023-10-11 MED ORDER — HYDROCODONE-ACETAMINOPHEN 5-325 MG PO TABS
1.0000 | ORAL_TABLET | ORAL | Status: DC | PRN
Start: 2023-10-11 — End: 2023-10-13

## 2023-10-11 MED ORDER — ONDANSETRON HCL 4 MG/2ML IJ SOLN: 4.0000 mg | Freq: Four times a day (QID) | INTRAMUSCULAR | Status: DC | PRN

## 2023-10-11 MED ORDER — SODIUM CHLORIDE 0.9 % IV SOLN: 250.0000 mL | INTRAVENOUS | Status: AC | PRN

## 2023-10-11 MED ORDER — IOHEXOL 350 MG/ML SOLN
75.0000 mL | Freq: Once | INTRAVENOUS | Status: AC | PRN
  Administered 2023-10-11: 75 mL via INTRAVENOUS

## 2023-10-11 MED ORDER — ONDANSETRON HCL 4 MG PO TABS: 4.0000 mg | ORAL_TABLET | Freq: Four times a day (QID) | ORAL | Status: DC | PRN

## 2023-10-11 NOTE — Assessment & Plan Note (Signed)
Order sliding scale Hold p.o. medications 

## 2023-10-11 NOTE — Assessment & Plan Note (Signed)
 Continue Coreg 12.5 mg twice daily amlodipine 5 mg daily

## 2023-10-11 NOTE — Assessment & Plan Note (Signed)
 Continue Lipitor 80 mg daily.

## 2023-10-11 NOTE — Assessment & Plan Note (Signed)
 Discussed with nephrology they are aware of the patient being admitted.  Plan for hemodialysis on Monday potentially fluid retention

## 2023-10-11 NOTE — Assessment & Plan Note (Signed)
 Patient and family states that she has intermittent need for oxygen sometimes when she presents to hemodialysis.  She does has history of asthma but is fairly well-controlled she has been having some cough but it has been improving. CT chest ordered to further evaluate.  Patient may benefit from pulmonology consult as an outpatient  And home oxygen

## 2023-10-11 NOTE — H&P (Addendum)
 [name]:[id number] DOB: 06-Sep-1945 DOA: 10/11/2023     PCP: Hoy Register, MD   Outpatient Specialists:   CARDS:   Dr. Rollene Rotunda, MD   Patient arrived to ER on 10/11/23 at 1414 Referred by Attending Derwood Kaplan, MD   Patient coming from:    home Lives With family      Chief Complaint:   Chief Complaint  Patient presents with   Blurred Vision   Cough    HPI: Paula [name] is a 78 y.o. female with medical history significant of diastolic CHF, asthma, DM2, end-stage renal disease on hemodialysis Monday Wednesday Friday, GERD, HLD, HTN, neuropathy,    Presented with feeling unwell blurred vision  Pt reports feeling extra tired this AM, has had a cough for the past 5 days  Endorsed blurred vision Last admission a month ago for pneumonia Feels similar now reports shallow breathing increased sputum production chills but no fever The blurred vision has improved No neurological symptoms otherwise no headache Patient is on hemodialysis last session was on Friday Found to be hypoxic mid 80s in room air in the ER and started on 3 L Patient is on Eliquis for history of A-fib At baseline not on oxygen  Per family patient has been coughing but the cough has been gradually getting better.  She recreationally wheezes but not today. Today she woke up after a nap and felt very tired her vision's felt blurred  She does get this occasionally and she feels that this is secondary to fluctuations in her blood pressure/blood sugar Today she had a bit more over double vision    Denies significant ETOH intake   Does not smoke   Lab Results  Component Value Date   SARSCOV2NAA NEGATIVE 10/11/2023   SARSCOV2NAA NEGATIVE 08/16/2023   SARSCOV2NAA NEGATIVE 07/29/2023   SARSCOV2NAA NEGATIVE 04/05/2022       Regarding pertinent Chronic problems:    Hyperlipidemia - on statins Lipitor (atorvastatin)  Lipid Panel     Component Value Date/Time   CHOL 106  09/01/2021 1041   CHOL 121 06/17/2021 0904   TRIG 61 09/01/2021 1041   HDL 46 09/01/2021 1041   HDL 46 06/17/2021 0904   CHOLHDL 2.3 09/01/2021 1041   VLDL 12 09/01/2021 1041   LDLCALC 48 09/01/2021 1041   LDLCALC 61 06/17/2021 0904   LABVLDL 14 06/17/2021 0904     HTN on Norvasc, coreg, imdur   chronic CHF diastolic/ - last echo  Recent Results (from the past 81191 hours)  ECHOCARDIOGRAM COMPLETE   Collection Time: 08/17/23 10:41 AM  Result Value   Weight 2,613.77   BP 136/68   Single Plane A2C EF 64.3   Single Plane A4C EF 61.2   Calc EF 62.1   S' Lateral 2.30   Area-P 1/2 4.80   Est EF 60 - 65%   Narrative      ECHOCARDIOGRAM REPORT     IMPRESSIONS    1. Left ventricular ejection fraction, by estimation, is 60 to 65%. Left ventricular ejection fraction by 2D MOD biplane is 62.1 %. The left ventricle has normal function. The left ventricle has no regional wall motion abnormalities. There is severe  concentric left ventricular hypertrophy. Left ventricular diastolic function could not be evaluated.  2. Right ventricular systolic function is low normal. The right ventricular size is normal. Tricuspid regurgitation signal is inadequate for assessing PA pressure.  3. Left atrial size was moderately dilated.  4. The mitral valve  is abnormal. Trivial mitral valve regurgitation.  5. The aortic valve is tricuspid. Aortic valve regurgitation is trivial. Aortic valve sclerosis/calcification is present, without any evidence of aortic stenosis.  6. The inferior vena cava is normal in size with greater than 50% respiratory variability, suggesting right atrial pressure of 3 mmHg.  Comparison(s): No prior Echocardiogram.               DM 2 -  Lab Results  Component Value Date   HGBA1C 7.4 (H) 08/17/2023        Asthma -well   controlled on home inhalers/ nebs                        A. Fib -   atrial fibrillation CHA2DS2 vas score  7   current  on anticoagulation with   Eliquis,           -  Rate control:  Currently controlled with Coreg            ESRD CrCl cannot be calculated (Unknown ideal weight.).  Lab Results  Component Value Date   CREATININE 6.94 (H) 10/11/2023   CREATININE 10.39 (H) 08/19/2023   CREATININE 10.87 (H) 08/17/2023   CREATININE 10.87 (H) 08/17/2023   Lab Results  Component Value Date   NA 133 (L) 10/11/2023   CL 93 (L) 10/11/2023   K 3.5 10/11/2023   CO2 29 10/11/2023   BUN 54 (H) 10/11/2023   CREATININE 6.94 (H) 10/11/2023   GFRNONAA 6 (L) 10/11/2023   CALCIUM 9.1 10/11/2023   PHOS 5.9 (H) 08/19/2023   ALBUMIN 3.5 10/11/2023   GLUCOSE 191 (H) 10/11/2023      Chronic anemia - baseline hg Hemoglobin & Hematocrit  Recent Labs    08/17/23 0806 08/19/23 1053 10/11/23 1530  HGB 7.9* 9.3* 10.3*   Iron/TIBC/Ferritin/ %Sat    Component Value Date/Time   IRON 62 09/02/2021 0740   IRON 44 01/04/2021 1153   TIBC 335 09/02/2021 0740   TIBC 277 01/04/2021 1153   FERRITIN 55 09/02/2021 0740   FERRITIN 170 (H) 01/04/2021 1153   IRONPCTSAT 19 09/02/2021 0740   IRONPCTSAT 16 01/04/2021 1153     While in ER:        Lab Orders         Resp panel by RT-PCR (RSV, Flu A&B, Covid) Anterior Nasal Swab         CBC with Differential         Comprehensive metabolic panel         Brain natriuretic peptide         POC CBG, ED      CXR - Mild cardiomegaly. No acute abnormality of the lungs.     Following Medications were ordered in ER: Medications - No data to display      ED Triage Vitals  Encounter Vitals Group     BP 10/11/23 1430 (!) 142/66     Systolic BP Percentile --      Diastolic BP Percentile --      Pulse Rate 10/11/23 1428 68     Resp 10/11/23 1428 17     Temp 10/11/23 1428 98.3 F (36.8 C)     Temp Source 10/11/23 1428 Oral     SpO2 10/11/23 1428 92 %     Weight --      Height --      Head Circumference --      Peak Flow --  Pain Score 10/11/23 1427 0     Pain Loc --      Pain Education --       Exclude from Growth Chart --   VHQI(69)@     _________________________________________ Significant initial  Findings: Abnormal Labs Reviewed  CBC WITH DIFFERENTIAL/PLATELET - Abnormal; Notable for the following components:      Result Value   RBC 3.46 (*)    Hemoglobin 10.3 (*)    HCT 30.8 (*)    RDW 17.2 (*)    All other components within normal limits  COMPREHENSIVE METABOLIC PANEL - Abnormal; Notable for the following components:   Sodium 133 (*)    Chloride 93 (*)    Glucose, Bld 191 (*)    BUN 54 (*)    Creatinine, Ser 6.94 (*)    GFR, Estimated 6 (*)    All other components within normal limits  BRAIN NATRIURETIC PEPTIDE - Abnormal; Notable for the following components:   B Natriuretic Peptide 827.8 (*)    All other components within normal limits  CBG MONITORING, ED - Abnormal; Notable for the following components:   Glucose-Capillary 186 (*)    All other components within normal limits    _________________________ Troponin  ordered   ECG: Ordered Personally reviewed and interpreted by me showing: HR : 75 Rhythm:  A.fib. no evidence of ischemic changes QTC 465  BNP (last 3 results) Recent Labs    08/16/23 1824 10/11/23 1530  BNP 2,362.8* 827.8*     COVID-19 Labs  No results for input(s): "DDIMER", "FERRITIN", "LDH", "CRP" in the last 72 hours.  Lab Results  Component Value Date   SARSCOV2NAA NEGATIVE 10/11/2023   SARSCOV2NAA NEGATIVE 08/16/2023   SARSCOV2NAA NEGATIVE 07/29/2023   SARSCOV2NAA NEGATIVE 04/05/2022      The recent clinical data is shown below. Vitals:   10/11/23 1428 10/11/23 1430 10/11/23 1524 10/11/23 1821  BP:  (!) 142/66    Pulse: 68     Resp: 17 18    Temp: 98.3 F (36.8 C)   98.2 F (36.8 C)  TempSrc: Oral     SpO2: 92%  (!) 88%     WBC     Component Value Date/Time   WBC 7.1 10/11/2023 1530   LYMPHSABS 1.1 10/11/2023 1530   LYMPHSABS 1.2 09/02/2018 1211   MONOABS 0.7 10/11/2023 1530   EOSABS 0.4 10/11/2023  1530   EOSABS 0.1 09/02/2018 1211   BASOSABS 0.0 10/11/2023 1530   BASOSABS 0.0 09/02/2018 1211     Procalcitonin   Ordered      UA  not ordered    Results for orders placed or performed during the hospital encounter of 10/11/23  Resp panel by RT-PCR (RSV, Flu A&B, Covid) Anterior Nasal Swab     Status: None   Collection Time: 10/11/23  4:22 PM   Specimen: Anterior Nasal Swab  Result Value Ref Range Status   SARS Coronavirus 2 by RT PCR NEGATIVE NEGATIVE Final   Influenza A by PCR NEGATIVE NEGATIVE Final   Influenza B by PCR NEGATIVE NEGATIVE Final         Resp Syncytial Virus by PCR NEGATIVE NEGATIVE Final         *Note: Due to a large number of results and/or encounters for the requested time period, some results have not been displayed. A complete set of results can be found in Results Review.    __ Venous  Blood Gas result:  pH   7.38 Acid-Base Excess  4.9 High  mmol/L   pCO2, Ven 53 mmHg O2 Saturation 74.6 %  pO2, Ven 45 mmHg       __________________________________________________________ Recent Labs  Lab 10/11/23 1530  NA 133*  K 3.5  CO2 29  GLUCOSE 191*  BUN 54*  CREATININE 6.94*  CALCIUM 9.1    Cr   stable,   Lab Results  Component Value Date   CREATININE 6.94 (H) 10/11/2023   CREATININE 10.39 (H) 08/19/2023   CREATININE 10.87 (H) 08/17/2023   CREATININE 10.87 (H) 08/17/2023    Recent Labs  Lab 10/11/23 1530  AST 16  ALT 16  ALKPHOS 89  BILITOT 0.6  PROT 7.1  ALBUMIN 3.5   Lab Results  Component Value Date   CALCIUM 9.1 10/11/2023   PHOS 5.9 (H) 08/19/2023          Plt: Lab Results  Component Value Date   PLT 183 10/11/2023    Recent Labs  Lab 10/11/23 1530  WBC 7.1  NEUTROABS 4.8  HGB 10.3*  HCT 30.8*  MCV 89.0  PLT 183    HG/HCT   stable,      Component Value Date/Time   HGB 10.3 (L) 10/11/2023 1530   HGB 12.4 09/02/2018 1211   HCT 30.8 (L) 10/11/2023 1530   HCT 37.3 09/02/2018 1211   MCV 89.0 10/11/2023 1530    MCV 87 09/02/2018 1211     _______________________________________________ Hospitalist was called for admission for    hypoxia    The following Work up has been ordered so far:  Orders Placed This Encounter  Procedures   Resp panel by RT-PCR (RSV, Flu A&B, Covid) Anterior Nasal Swab   DG Chest 2 View   CT Angio Chest PE W and/or Wo Contrast   CBC with Differential   Comprehensive metabolic panel   Brain natriuretic peptide   Consult to hospitalist   POC CBG, ED   EKG 12-Lead   ED EKG     OTHER Significant initial  Findings:  labs showing:     DM  labs:  HbA1C: Recent Labs    11/20/22 1115 04/16/23 1423 08/17/23 0806  HGBA1C 6.6 6.5 7.4*       CBG (last 3)  Recent Labs    10/11/23 1505  GLUCAP 186*          Cultures:    Component Value Date/Time   SDES SPUTUM 08/19/2023 1815   SPECREQUEST NONE 08/19/2023 1815   CULT (A) 11/18/2017 1040    50,000 COLONIES/mL Consistent with normal respiratory flora. Performed at [facility], [address] [place], [place] [address]    REPTSTATUS 08/24/2023 FINAL 08/19/2023 1815     Radiological Exams on Admission: DG Chest 2 View Result Date: 10/11/2023 CLINICAL DATA:  Cough EXAM: CHEST - 2 VIEW COMPARISON:  08/16/2023 FINDINGS: Mild cardiomegaly. Both lungs are clear. The visualized skeletal structures are unremarkable. IMPRESSION: Mild cardiomegaly.  No acute abnormality of the lungs. Electronically Signed   By: Jearld Lesch M.D.   On: 10/11/2023 15:28   _______________________________________________________________________________________________________ Latest  Blood pressure (!) 142/66, pulse 68, temperature 98.2 F (36.8 C), resp. rate 18, SpO2 (!) 88%.   Vitals  labs and radiology finding personally reviewed  Review of Systems:    Pertinent positives include:  fatigue, blurred vision  Constitutional:  No weight loss, night sweats, Fevers, chills,  weight loss  HEENT:  No headaches, Difficulty  swallowing,Tooth/dental problems,Sore throat,  No sneezing, itching, ear ache, nasal congestion, post  nasal drip,  Cardio-vascular:  No chest pain, Orthopnea, PND, anasarca, dizziness, palpitations.no Bilateral lower extremity swelling  GI:  No heartburn, indigestion, abdominal pain, nausea, vomiting, diarrhea, change in bowel habits, loss of appetite, melena, blood in stool, hematemesis Resp:  no shortness of breath at rest. No dyspnea on exertion, No excess mucus, no productive cough, No non-productive cough, No coughing up of blood.No change in color of mucus.No wheezing. Skin:  no rash or lesions. No jaundice GU:  no dysuria, change in color of urine, no urgency or frequency. No straining to urinate.  No flank pain.  Musculoskeletal:  No joint pain or no joint swelling. No decreased range of motion. No back pain.  Psych:  No change in mood or affect. No depression or anxiety. No memory loss.  Neuro: no localizing neurological complaints, no tingling, no weakness, no double vision, no gait abnormality, no slurred speech, no confusion  All systems reviewed and apart from HOPI all are negative _______________________________________________________________________________________________ Past Medical History:   Past Medical History:  Diagnosis Date   Allergic rhinitis    Allergy 1/12000   Arthritis    Asthma    Brain aneurysm    Cough    Diabetes mellitus    Diastolic CHF, chronic (HCC) 10/11/2011   ESRD on hemodialysis (HCC)    MWF   GERD (gastroesophageal reflux disease)    History of colon polyps 2012   tubular adenoma    Hyperlipidemia    Hypertension    Neuropathy 10/11/2011   PONV (postoperative nausea and vomiting)    one time after lymph node surgery      Past Surgical History:  Procedure Laterality Date   AV FISTULA PLACEMENT Right 10/15/2021   Procedure: ARTERIOVENOUS (AV) FISTULA  WITH PLACEMENT OF GORE-TEX STRETCH  GRAFT (4-57mmx45cm);  Surgeon: Chuck Hint, MD;  Location: [facility] OR;  Service: Vascular;  Laterality: Right;   CATARACT EXTRACTION     right eye   IR 3D INDEPENDENT WKST  12/14/2017   IR ANGIO INTRA EXTRACRAN SEL COM CAROTID INNOMINATE BILAT MOD SED  09/15/2017   IR ANGIO INTRA EXTRACRAN SEL COM CAROTID INNOMINATE UNI L MOD SED  12/14/2017   IR ANGIO VERTEBRAL SEL VERTEBRAL BILAT MOD SED  09/15/2017   IR RADIOLOGIST EVAL & MGMT  09/10/2017   IR RADIOLOGIST EVAL & MGMT  10/19/2017   lymphatic mass surgery     NASAL TURBINATE REDUCTION     RADIOLOGY WITH ANESTHESIA N/A 12/14/2017   Procedure: RADIOLOGY WITH ANESTHESIA EMBOLIZATION;  Surgeon: Julieanne Cotton, MD;  Location: [facility] OR;  Service: Radiology;  Laterality: N/A;   VIDEO BRONCHOSCOPY Bilateral 11/18/2017   Procedure: VIDEO BRONCHOSCOPY WITHOUT FLUORO;  Surgeon: Leslye Peer, MD;  Location: [facility] ENDOSCOPY;  Service: Cardiopulmonary;  Laterality: Bilateral;    Social History:  Ambulatory   cane, walker     reports that she has never smoked. She has never been exposed to tobacco smoke. She has never used smokeless tobacco. She reports that she does not drink alcohol and does not use drugs.   Family History:   Family History  Problem Relation Age of Onset   Hypertension Mother    Asthma Sister    Colon cancer Neg Hx    Allergic rhinitis Neg Hx    Eczema Neg Hx    ______________________________________________________________________________________________ Allergies: Allergies  Allergen Reactions   Dust Mite Extract Cough   Peanut-Containing Drug Products Cough   Ace Inhibitors Cough   Insulins  Reports fainting, intolerance   Lovastatin Other (See Comments)    Generalized body pain     Prior to Admission medications   Medication Sig Start Date End Date Taking? Authorizing Provider  acetaminophen (TYLENOL) 500 MG tablet Take 500 mg by mouth every 6 (six) hours as needed for fever, moderate pain or mild pain.    [provider]   albuterol (PROAIR HFA) 108 (90 Base) MCG/ACT inhaler Inhale 1-2 puffs into the lungs every 6 (six) hours as needed for wheezing or shortness of breath. 04/16/23   Anders Simmonds, PA-C  amLODipine (NORVASC) 5 MG tablet Take 1 tablet (5 mg total) by mouth daily. 09/02/23   Hoy Register, MD  apixaban (ELIQUIS) 5 MG TABS tablet Take 1 tablet (5 mg total) by mouth 2 (two) times daily. 04/02/23   Hoy Register, MD  atorvastatin (LIPITOR) 80 MG tablet Take 1 tablet (80 mg total) by mouth daily. 07/16/23   Hoy Register, MD  azelastine (ASTELIN) 0.1 % nasal spray Place 2 sprays into both nostrils 2 (two) times daily. Use in each nostril as directed 01/08/23   Hoy Register, MD  calcium acetate (PHOSLO) 667 MG capsule Take 1 capsule (667 mg total) by mouth 3 (three) times daily with meals. 07/17/23     carvedilol (COREG) 12.5 MG tablet Take 1 tablet (12.5 mg total) by mouth 2 (two) times daily with a meal. (Take once a day in the evening on hemodialysis days) 10/09/23   Hoy Register, MD  fluticasone (FLONASE) 50 MCG/ACT nasal spray Place 2 sprays into both nostrils daily. 01/08/23   Hoy Register, MD  fluticasone-salmeterol (ADVAIR) 100-50 MCG/ACT AEPB Inhale 1 puff into the lungs 2 (two) times daily. 08/20/23 11/18/23  [place], Alvira Philips, DO  gabapentin (NEURONTIN) 100 MG capsule Take 1 capsule (100 mg total) by mouth 3 (three) times daily. 11/25/22   Hoy Register, MD  glipiZIDE (GLUCOTROL XL) 2.5 MG 24 hr tablet Take 1 tablet (2.5 mg total) by mouth daily with breakfast. (needs office visit) Patient taking differently: Take 2.5 mg by mouth See admin instructions. Take 2.5mg  by mouth once a day. If blood sugar is high, take an additional tablet on the same day. 07/16/23   Hoy Register, MD  glucose blood (ACCU-CHEK GUIDE) test strip USE AS INSTRUCTED 2 TIMES DAILY 04/16/23   Anders Simmonds, PA-C  Homeopathic Products (EARACHE DROPS OT) Place 1 drop into both ears daily as needed (earache).    [provider]  isosorbide mononitrate (IMDUR) 60 MG 24 hr tablet Take 1 tablet (60 mg total) by mouth daily. 07/16/23   Hoy Register, MD  lidocaine-prilocaine (EMLA) cream Apply a small amount to dialysis access one hour prior to dialysis. Wrap with plastic wrap. 12/10/21     linagliptin (TRADJENTA) 5 MG TABS tablet Take 1 tablet (5 mg total) by mouth daily. 08/25/23   [place], [place], MD  loratadine (CLARITIN) 10 MG tablet Take 1 tablet (10 mg total) by mouth daily. 09/02/23   Hoy Register, MD  midodrine (PROAMATINE) 10 MG tablet Take 1 tablet (10 mg total) by mouth 3 (three) times a week. Take 30 minutes prior to HD Patient taking differently: Take 10 mg by mouth See admin instructions. Take 1 tablet by mouth prior to dialysis if blood pressure is low. 07/17/23     Misc. Devices [employer]  Diagnosis - unstable gait 03/05/21   Hoy Register, MD  Misc. Devices MISC Fistula sleeve for R arm.  Diagnoses: End-stage renal  disease on hemodialysis 01/09/22   Hoy Register, MD  Misc. Devices MISC 1. Incontinence supplies.  2. Briefs.  Diagnosis-urinary incontinence 04/17/22   Hoy Register, MD  Misc. Devices MISC Nepro dialysis protein drink.  Diagnosis end-stage renal disease 05/16/22   Hoy Register, MD  Misc. Devices MISC Blood pressure monitor.  Diagnosis hypertension 05/20/22   Hoy Register, MD  montelukast (SINGULAIR) 10 MG tablet Take 1 tablet (10 mg total) by mouth at bedtime. 08/20/23 11/18/23  [place], Alvira Philips, DO  olopatadine (PATANOL) 0.1 % ophthalmic solution Place 1 drop into both eyes 2 (two) times daily. 08/14/22   Storm Frisk, MD  Throat Lozenges (HALLS COUGH DROPS MT) Use as directed 1 each in the mouth or throat daily as needed (cough).    [provider]    ___________________________________________________________________________________________________ Physical Exam:    10/11/2023    2:30 PM 10/11/2023    2:28 PM 09/15/2023    4:45 PM  Vitals with BMI  Systolic 142   175  Diastolic 66  68  Pulse  68      1. General:  in No  Acute distress   Chronically ill   -appearing 2. Psychological: Alert and   Oriented 3. Head/ENT:   Moist  Mucous Membranes                          Head Non traumatic, neck supple                           Poor Dentition 4. SKIN: normal  Skin turgor,  Skin clean Dry and intact no rash    5. Heart: Regular rate and rhythm no  Murmur, no Rub or gallop 6. Lungs  no wheezes or crackles   7. Abdomen: Soft,  non-tender, Non distended   obese  bowel sounds present 8. Lower extremities: no clubbing, cyanosis,  trace edema  Neurologically strength 5 out of 5 in all 4 extremities cranial nerves II through XII intact 10. MSK: Normal range of motion    Chart has been reviewed  ______________________________________________________________________________________________  Assessment/Plan 78 y.o. female with medical history significant of diastolic CHF, asthma, DM2, end-stage renal disease on hemodialysis Monday Wednesday Friday, GERD, HLD, HTN, neuropathy,   Admitted for acute respiratory failure with hypoxia   Present on Admission:  Acute respiratory failure with hypoxia (HCC)  Essential hypertension  Paroxysmal A-fib (HCC)  Hyperlipidemia  Chronic diastolic CHF (congestive heart failure) (HCC)  Acute hypoxic respiratory failure (HCC)  ILD (interstitial lung disease) (HCC)  Blurry vision  Anemia due to chronic kidney disease     Acute respiratory failure with hypoxia (HCC) Unclear etiology.  Patient is compliant with Eliquis. She does have occasional left lower extremity swelling States that she occasionally requires oxygen while on dialysis Chest x-ray unremarkable Ordered CT chest to further evaluate for possible underlying pneumonia and/or other pulmonary pathology Patient has history of asthma but currently appears to be stable Family requesting prescription for home oxygen stating that she occasionally needs  it  Essential hypertension Continue Coreg 12.5 mg twice daily amlodipine 5 mg daily  Paroxysmal A-fib (HCC) Continue Eliquis 5 mg p.o. twice daily and Coreg 12.5 twice daily  Type 2 diabetes mellitus without complication, without long-term current use of insulin (HCC) Order sliding scale Hold p.o. medications  Hyperlipidemia Continue Lipitor 80 mg daily  Chronic diastolic CHF (congestive heart failure) (HCC) Echogram showed severe concentric  hypertrophy Fluid management as per dialysis Continue home medications including Coreg 12.5 mg twice daily Clinically does not appear to be significantly fluid overloaded  Acute hypoxic respiratory failure (HCC) Patient and family states that she has intermittent need for oxygen sometimes when she presents to hemodialysis.  She does has history of asthma but is fairly well-controlled she has been having some cough but it has been improving. CT chest ordered to further evaluate.  Patient may benefit from pulmonology consult as an outpatient  And home oxygen  ESRD on dialysis [facility]) Discussed with nephrology they are aware of the patient being admitted.  Plan for hemodialysis on Monday potentially fluid retention  ILD (interstitial lung disease) (HCC) May explain intermittent hypoxia.  CT chest to further evaluate lung parenchyma she will need to continue follow-up with pulmonology  Blurry vision Patient endorses intermittent blurry vision she feels that this may be related to blood pressure fluctuations and visual fluctuations. Otherwise neurologically intact reports sometimes her right leg is stronger than her left but not currently Patient felt like she today woke up from a nap and had somewhat more blurry vision than usual.  States she had an episode of double vision today as well she is supposed to see ophthalmologist as an outpatient. For completion obtain MRI brain to further evaluate  Anemia due to chronic kidney disease Obtain anemia  panel  Transfuse for Hg <7 , rapidly dropping or  if symptomatic    Other plan as per orders.  DVT prophylaxis: Eliquis    Code Status:    Code Status: Prior FULL CODE   as per patient  family  I had personally discussed CODE STATUS with patient and family  ACP   none    Family Communication:   Family  at  Bedside  plan of care was discussed  with  Daughter,   Diet  Diabetic/renal  Disposition Plan:      To home once workup is complete and patient is stable   Following barriers for discharge:                                                         Electrolytes corrected                               Anemia   stable                                                       Will likely need home health, home O2, set up                                 Consult Orders  (From admission, onward)           Start     Ordered   10/11/23 1738  Consult to hospitalist  Once       Provider:  (Not yet assigned)  Question Answer Comment  Place call to: [facility]   Reason for Consult Admit      10/11/23 1737  Would benefit from PT/OT eval prior to DC  Ordered                                      Consults called: Nephrology is aware   Admission status:  ED Disposition     ED Disposition  Admit   Condition  --   Comment  Hospital [facility]: [facility] [facility] [[id number]]  Level of Care: Telemetry Cardiac [103]  May place patient in observation at [facility] or [facility] if equivalent level of care is available:: No  Covid Evaluation: Asymptomatic - no recent exposure (last 10 days) testing not required  Diagnosis: Acute respiratory failure with hypoxia [facility]) [[id number]]  Admitting Physician: Therisa Doyne [[id number]]  Attending Physician: Therisa Doyne [[id number]]           Obs     Level of care     tele  For  24H     Lab Results  Component Value Date   SARSCOV2NAA NEGATIVE 10/11/2023     Precautions: admitted  as   Covid Negative      Called to bedside pt BG up to 260 and she is having double vision again, pt and family refused SSI Requesting Glipizide 2.5 mg  Discussed that SSI would be a safer option, pt refused  Neurologically intact Eye movements smooth   MRI brain ordered  Will give one dose of Glipizide 2.5 mg and follow BG closely to monitor for any evidence of hypoglycemia    Shamell Suarez 10/11/2023, 10:50 PM    [facility]     after 2 AM please page floor coverage PA If 7AM-7PM, please contact the day team taking care of the patient using [email or web address]

## 2023-10-11 NOTE — Assessment & Plan Note (Signed)
 May explain intermittent hypoxia.  CT chest to further evaluate lung parenchyma she will need to continue follow-up with pulmonology

## 2023-10-11 NOTE — ED Notes (Signed)
 Family at bedside.

## 2023-10-11 NOTE — Assessment & Plan Note (Signed)
 Unclear etiology.  Patient is compliant with Eliquis. She does have occasional left lower extremity swelling States that she occasionally requires oxygen while on dialysis Chest x-ray unremarkable Ordered CT chest to further evaluate for possible underlying pneumonia and/or other pulmonary pathology Patient has history of asthma but currently appears to be stable Family requesting prescription for home oxygen stating that she occasionally needs it

## 2023-10-11 NOTE — ED Notes (Addendum)
 Pt O2 sats 82-85% on 5L Nasal cannula while sleeping. When awake she is 88-90%. Asymptomatic, Doutova MD notified. Pt placed on Non-rebreather at 2225, stas increased to 100% initially, after about 2 minutes sats decreased 90-95%. Pt still asymptomatic. RT called to bedside.

## 2023-10-11 NOTE — Assessment & Plan Note (Signed)
 Continue Eliquis 5 mg p.o. twice daily and Coreg 12.5 twice daily

## 2023-10-11 NOTE — Assessment & Plan Note (Signed)
 Echogram showed severe concentric hypertrophy Fluid management as per dialysis Continue home medications including Coreg 12.5 mg twice daily Clinically does not appear to be significantly fluid overloaded

## 2023-10-11 NOTE — Assessment & Plan Note (Signed)
 Obtain anemia panel  Transfuse for Hg <7 , rapidly dropping or  if symptomatic

## 2023-10-11 NOTE — ED Notes (Signed)
 Pt Aox4, GCS 15.   Pt resting comfortably at this time with no signs of distress.   V/S assessed and stable.   Pt placed on 3lpm [employer] by provider for RA sat of 88%.

## 2023-10-11 NOTE — ED Triage Notes (Signed)
 Pt arrives via [facility] from home. Pt reports she woke up around 0830 this morning, she was feeling tired. She states she began experiencing blurred vision around 0930 and some dizziness. Pt arrives AxOx4. She is on Eliquis. She denies dizziness at this time but continues to have blurred vision. No other neurological deficits noted. Pt also reports productive cough for the past 5 days.

## 2023-10-11 NOTE — ED Notes (Signed)
 Pt refusing insulin and states that she cannot take insulin. Doutova MD notified.

## 2023-10-11 NOTE — Assessment & Plan Note (Signed)
 Patient endorses intermittent blurry vision she feels that this may be related to blood pressure fluctuations and visual fluctuations. Otherwise neurologically intact reports sometimes her right leg is stronger than her left but not currently Patient felt like she today woke up from a nap and had somewhat more blurry vision than usual.  States she had an episode of double vision today as well she is supposed to see ophthalmologist as an outpatient. For completion obtain MRI brain to further evaluate

## 2023-10-11 NOTE — Subjective & Objective (Signed)
 Pt reports feeling extra tired this AM, has had a cough for the past 5 days  Endorsed blurred vision Last admission a month ago for pneumonia Feels similar now reports shallow breathing increased sputum production chills but no fever The blurred vision has improved No neurological symptoms otherwise no headache Patient is on hemodialysis last session was on Friday Found to be hypoxic mid 80s in room air in the ER and started on 3 L Patient is on Eliquis for history of A-fib At baseline not on oxygen

## 2023-10-11 NOTE — ED Notes (Signed)
 Attempted twice for IV access x2 and lab values. Unsuccessful. IV team consult placed.

## 2023-10-11 NOTE — ED Provider Notes (Signed)
 [place] EMERGENCY DEPARTMENT AT [facility] Provider Note  Arrival date/time:10/11/2023 7:21 PM  HPI/ROS   Paula [name] is a 78 y.o. female with PMH significant for A-fib on Eliquis, CHF (LVEF 62% 08/17/23), T2DM, HTN, GERD, HLD, ESRD on HD MWF who presents for cough and shortness of breath History is provided by patient.  Patient is a for the past week, she has had increased cough and congestion.  She notes that she was seen at the hospital a month ago for pneumonia.  She was discharged she is feeling better, however clinical her symptoms came back and she is feeling that her breathing is much shallower now.  She is most persistent sputum production.  She also endorses intermittent chills.  She also notes that her vision is slightly blurred this morning.  She has had a lower carb pressure, but when she took her blood pressure it was normal.  Her vision is somewhat improved at this point.  She denies any headaches or other neurologic symptoms.  Most recent hemodialysis session was Friday and received a full session  A complete ROS was performed with pertinent positives/negatives noted above.   ED Course and Medical Decision Making   I personally reviewed the patient's vitals.  ER Provider interpretation of labs: Her CBC shows no leukocytosis, hemoglobin is 10.3 which is baseline for her in the setting of ESRD   Assessment/Plan: This is a 78 year old patient presenting for cough and shortness of breath worsening over the last week.  On evaluation, patient is satting in the 80s on room air and I placed her on 3 L nasal cannula with improvement in her saturations.  She was recently discharged from hospital on 08/20/2023 for community-acquired pneumonia with mixed HFrEF exacerbation.  Patient has no history of COPD or asthma and has no wheezing on exam. Low suspicion for pulmonary embolism given that patient is anticoagulated on Eliquis with no missed doses.  Also atypical  presentation in the setting of cough/congestion and sputum production. Her symptoms are consistent with possible pneumonia, although her chest x-ray does not reveal any focal consolidation.  Will plan to obtain CTA to further evaluate patient source of hypoxia.  Will plan to pursue admission for patient's new oxygen requirement  Disposition:  I discussed the case with hospitalist who graciously agreed to admit the patient to their service for continued care.   Clinical Impression:  1. SOB (shortness of breath)     Rx / DC Orders ED Discharge Orders          Ordered    Brain natriuretic peptide  Status:  Canceled        10/11/23 1525            The plan for this patient was discussed with Dr. Rhunette Croft, who voiced agreement and who oversaw evaluation and treatment of this patient.   Clinical Complexity A medically appropriate history, review of systems, and physical exam was performed.  Patient's presentation is most consistent with acute presentation with potential threat to life or bodily function.  Medical Decision Making Amount and/or Complexity of Data Reviewed Labs: ordered. Radiology: ordered.  Risk Decision regarding hospitalization.    Physical Exam and Medical History   Vitals:   10/11/23 1428 10/11/23 1430 10/11/23 1524 10/11/23 1821  BP:  (!) 142/66    Pulse: 68     Resp: 17 18    Temp: 98.3 F (36.8 C)   98.2 F (36.8 C)  TempSrc: Oral  SpO2: 92%  (!) 88%     Physical Exam Vitals and nursing note reviewed.  Constitutional:      General: She is not in acute distress.    Appearance: She is well-developed.  HENT:     Head: Normocephalic and atraumatic.  Eyes:     Extraocular Movements: Extraocular movements intact.     Comments: Acuity: R - 20/20, L - 20/30  Cardiovascular:     Rate and Rhythm: Normal rate. Rhythm irregular.     Heart sounds: No murmur heard. Pulmonary:     Effort: Pulmonary effort is normal. No respiratory distress.      Breath sounds: No wheezing.  Abdominal:     Palpations: Abdomen is soft.     Tenderness: There is no abdominal tenderness.  Musculoskeletal:        General: No swelling.     Cervical back: Neck supple.  Skin:    General: Skin is warm and dry.     Capillary Refill: Capillary refill takes less than 2 seconds.  Neurological:     Mental Status: She is alert.  Psychiatric:        Mood and Affect: Mood normal.     Medical History: Allergies  Allergen Reactions   Dust Mite Extract Cough   Peanut-Containing Drug Products Cough   Ace Inhibitors Cough   Insulins     Reports fainting, intolerance   Lovastatin Other (See Comments)    Generalized body pain   Past Medical History:  Diagnosis Date   Allergic rhinitis    Allergy 1/12000   Arthritis    Asthma    Brain aneurysm    Cough    Diabetes mellitus    Diastolic CHF, chronic (HCC) 10/11/2011   ESRD on hemodialysis (HCC)    MWF   GERD (gastroesophageal reflux disease)    History of colon polyps 2012   tubular adenoma    Hyperlipidemia    Hypertension    Neuropathy 10/11/2011   PONV (postoperative nausea and vomiting)    one time after lymph node surgery    Past Surgical History:  Procedure Laterality Date   AV FISTULA PLACEMENT Right 10/15/2021   Procedure: ARTERIOVENOUS (AV) FISTULA  WITH PLACEMENT OF GORE-TEX STRETCH  GRAFT (4-60mmx45cm);  Surgeon: Chuck Hint, MD;  Location: [facility] OR;  Service: Vascular;  Laterality: Right;   CATARACT EXTRACTION     right eye   IR 3D INDEPENDENT WKST  12/14/2017   IR ANGIO INTRA EXTRACRAN SEL COM CAROTID INNOMINATE BILAT MOD SED  09/15/2017   IR ANGIO INTRA EXTRACRAN SEL COM CAROTID INNOMINATE UNI L MOD SED  12/14/2017   IR ANGIO VERTEBRAL SEL VERTEBRAL BILAT MOD SED  09/15/2017   IR RADIOLOGIST EVAL & MGMT  09/10/2017   IR RADIOLOGIST EVAL & MGMT  10/19/2017   lymphatic mass surgery     NASAL TURBINATE REDUCTION     RADIOLOGY WITH ANESTHESIA N/A 12/14/2017    Procedure: RADIOLOGY WITH ANESTHESIA EMBOLIZATION;  Surgeon: Julieanne Cotton, MD;  Location: [facility] OR;  Service: Radiology;  Laterality: N/A;   VIDEO BRONCHOSCOPY Bilateral 11/18/2017   Procedure: VIDEO BRONCHOSCOPY WITHOUT FLUORO;  Surgeon: Leslye Peer, MD;  Location: [facility] ENDOSCOPY;  Service: Cardiopulmonary;  Laterality: Bilateral;   Family History  Problem Relation Age of Onset   Hypertension Mother    Asthma Sister    Colon cancer Neg Hx    Allergic rhinitis Neg Hx    Eczema Neg Hx     Social History  Tobacco Use   Smoking status: Never    Passive exposure: Never   Smokeless tobacco: Never  Vaping Use   Vaping status: Never Used  Substance Use Topics   Alcohol use: No   Drug use: No    Procedures   If procedures were preformed on this patient, they are listed below:  Procedures   -------- HPI and MDM generated using voice dictation software and may contain dictation errors. Please contact me for any clarification or with any questions.   Cephus Slater, MD Emergency Medicine PGY-2    Caron Presume, MD 10/11/23 Earlean Polka, MD 10/12/23 [phone]

## 2023-10-12 ENCOUNTER — Telehealth: Payer: Self-pay

## 2023-10-12 DIAGNOSIS — D631 Anemia in chronic kidney disease: Secondary | ICD-10-CM | POA: Diagnosis not present

## 2023-10-12 DIAGNOSIS — I120 Hypertensive chronic kidney disease with stage 5 chronic kidney disease or end stage renal disease: Secondary | ICD-10-CM | POA: Diagnosis not present

## 2023-10-12 DIAGNOSIS — K219 Gastro-esophageal reflux disease without esophagitis: Secondary | ICD-10-CM | POA: Diagnosis present

## 2023-10-12 DIAGNOSIS — E119 Type 2 diabetes mellitus without complications: Secondary | ICD-10-CM | POA: Diagnosis not present

## 2023-10-12 DIAGNOSIS — I132 Hypertensive heart and chronic kidney disease with heart failure and with stage 5 chronic kidney disease, or end stage renal disease: Secondary | ICD-10-CM | POA: Diagnosis present

## 2023-10-12 DIAGNOSIS — I480 Paroxysmal atrial fibrillation: Secondary | ICD-10-CM | POA: Diagnosis present

## 2023-10-12 DIAGNOSIS — E1140 Type 2 diabetes mellitus with diabetic neuropathy, unspecified: Secondary | ICD-10-CM | POA: Diagnosis present

## 2023-10-12 DIAGNOSIS — J849 Interstitial pulmonary disease, unspecified: Secondary | ICD-10-CM | POA: Diagnosis present

## 2023-10-12 DIAGNOSIS — H538 Other visual disturbances: Secondary | ICD-10-CM | POA: Diagnosis not present

## 2023-10-12 DIAGNOSIS — E669 Obesity, unspecified: Secondary | ICD-10-CM | POA: Diagnosis present

## 2023-10-12 DIAGNOSIS — I509 Heart failure, unspecified: Secondary | ICD-10-CM

## 2023-10-12 DIAGNOSIS — Z7901 Long term (current) use of anticoagulants: Secondary | ICD-10-CM | POA: Diagnosis not present

## 2023-10-12 DIAGNOSIS — E1142 Type 2 diabetes mellitus with diabetic polyneuropathy: Secondary | ICD-10-CM | POA: Diagnosis present

## 2023-10-12 DIAGNOSIS — E1122 Type 2 diabetes mellitus with diabetic chronic kidney disease: Secondary | ICD-10-CM | POA: Diagnosis present

## 2023-10-12 DIAGNOSIS — Z992 Dependence on renal dialysis: Secondary | ICD-10-CM | POA: Diagnosis not present

## 2023-10-12 DIAGNOSIS — I671 Cerebral aneurysm, nonruptured: Secondary | ICD-10-CM | POA: Diagnosis present

## 2023-10-12 DIAGNOSIS — I2510 Atherosclerotic heart disease of native coronary artery without angina pectoris: Secondary | ICD-10-CM | POA: Diagnosis present

## 2023-10-12 DIAGNOSIS — I272 Pulmonary hypertension, unspecified: Secondary | ICD-10-CM | POA: Diagnosis present

## 2023-10-12 DIAGNOSIS — R059 Cough, unspecified: Secondary | ICD-10-CM | POA: Diagnosis present

## 2023-10-12 DIAGNOSIS — R053 Chronic cough: Secondary | ICD-10-CM | POA: Diagnosis not present

## 2023-10-12 DIAGNOSIS — J209 Acute bronchitis, unspecified: Secondary | ICD-10-CM | POA: Diagnosis not present

## 2023-10-12 DIAGNOSIS — J9811 Atelectasis: Secondary | ICD-10-CM | POA: Diagnosis present

## 2023-10-12 DIAGNOSIS — E785 Hyperlipidemia, unspecified: Secondary | ICD-10-CM | POA: Diagnosis present

## 2023-10-12 DIAGNOSIS — N186 End stage renal disease: Secondary | ICD-10-CM | POA: Diagnosis not present

## 2023-10-12 DIAGNOSIS — J9601 Acute respiratory failure with hypoxia: Secondary | ICD-10-CM | POA: Diagnosis not present

## 2023-10-12 DIAGNOSIS — J40 Bronchitis, not specified as acute or chronic: Secondary | ICD-10-CM | POA: Diagnosis present

## 2023-10-12 DIAGNOSIS — Z1152 Encounter for screening for COVID-19: Secondary | ICD-10-CM | POA: Diagnosis not present

## 2023-10-12 DIAGNOSIS — I10 Essential (primary) hypertension: Secondary | ICD-10-CM | POA: Diagnosis not present

## 2023-10-12 DIAGNOSIS — N2581 Secondary hyperparathyroidism of renal origin: Secondary | ICD-10-CM | POA: Diagnosis not present

## 2023-10-12 DIAGNOSIS — H532 Diplopia: Secondary | ICD-10-CM | POA: Diagnosis present

## 2023-10-12 DIAGNOSIS — Z8701 Personal history of pneumonia (recurrent): Secondary | ICD-10-CM | POA: Diagnosis not present

## 2023-10-12 DIAGNOSIS — I5043 Acute on chronic combined systolic (congestive) and diastolic (congestive) heart failure: Secondary | ICD-10-CM | POA: Diagnosis present

## 2023-10-12 HISTORY — DX: Heart failure, unspecified: I50.9

## 2023-10-12 LAB — COMPREHENSIVE METABOLIC PANEL
ALT: 14 U/L (ref 0–44)
AST: 11 U/L — ABNORMAL LOW (ref 15–41)
Albumin: 3.1 g/dL — ABNORMAL LOW (ref 3.5–5.0)
Alkaline Phosphatase: 87 U/L (ref 38–126)
Anion gap: 12 (ref 5–15)
BUN: 60 mg/dL — ABNORMAL HIGH (ref 8–23)
CO2: 28 mmol/L (ref 22–32)
Calcium: 8.6 mg/dL — ABNORMAL LOW (ref 8.9–10.3)
Chloride: 92 mmol/L — ABNORMAL LOW (ref 98–111)
Creatinine, Ser: 7.68 mg/dL — ABNORMAL HIGH (ref 0.44–1.00)
GFR, Estimated: 5 mL/min — ABNORMAL LOW (ref >60)
Glucose, Bld: 184 mg/dL — ABNORMAL HIGH (ref 70–99)
Potassium: 3.6 mmol/L (ref 3.5–5.1)
Sodium: 132 mmol/L — ABNORMAL LOW (ref 135–145)
Total Bilirubin: 0.9 mg/dL (ref 0.0–1.2)
Total Protein: 6.2 g/dL — ABNORMAL LOW (ref 6.5–8.1)

## 2023-10-12 LAB — CBC
HCT: 26.3 % — ABNORMAL LOW (ref 36.0–46.0)
Hemoglobin: 8.9 g/dL — ABNORMAL LOW (ref 12.0–15.0)
MCH: 30.3 pg (ref 26.0–34.0)
MCHC: 33.8 g/dL (ref 30.0–36.0)
MCV: 89.5 fL (ref 80.0–100.0)
Platelets: 154 10*3/uL (ref 150–400)
RBC: 2.94 MIL/uL — ABNORMAL LOW (ref 3.87–5.11)
RDW: 17.6 % — ABNORMAL HIGH (ref 11.5–15.5)
WBC: 6.6 10*3/uL (ref 4.0–10.5)
nRBC: 0 % (ref 0.0–0.2)

## 2023-10-12 LAB — EXPECTORATED SPUTUM ASSESSMENT W GRAM STAIN, RFLX TO RESP C

## 2023-10-12 LAB — CBG MONITORING, ED
Glucose-Capillary: 132 mg/dL — ABNORMAL HIGH (ref 70–99)
Glucose-Capillary: 173 mg/dL — ABNORMAL HIGH (ref 70–99)
Glucose-Capillary: 209 mg/dL — ABNORMAL HIGH (ref 70–99)

## 2023-10-12 LAB — PREALBUMIN: Prealbumin: 32 mg/dL (ref 18–38)

## 2023-10-12 LAB — PHOSPHORUS: Phosphorus: 4.4 mg/dL (ref 2.5–4.6)

## 2023-10-12 LAB — GLUCOSE, CAPILLARY
Glucose-Capillary: 110 mg/dL — ABNORMAL HIGH (ref 70–99)
Glucose-Capillary: 122 mg/dL — ABNORMAL HIGH (ref 70–99)

## 2023-10-12 LAB — VITAMIN B12: Vitamin B-12: 1163 pg/mL — ABNORMAL HIGH (ref 180–914)

## 2023-10-12 LAB — FOLATE: Folate: 8.5 ng/mL (ref >5.9)

## 2023-10-12 LAB — MAGNESIUM: Magnesium: 2.4 mg/dL (ref 1.7–2.4)

## 2023-10-12 LAB — PROCALCITONIN: Procalcitonin: 0.14 ng/mL

## 2023-10-12 MED ORDER — FLUTICASONE PROPIONATE 50 MCG/ACT NA SUSP
2.0000 | Freq: Every day | NASAL | Status: DC
  Administered 2023-10-13: 2 via NASAL
  Filled 2023-10-12: qty 16

## 2023-10-12 MED ORDER — CALCIUM ACETATE (PHOS BINDER) 667 MG PO CAPS
667.0000 mg | ORAL_CAPSULE | Freq: Three times a day (TID) | ORAL | Status: DC
  Administered 2023-10-13 (×2): 667 mg via ORAL
  Filled 2023-10-12 (×2): qty 1

## 2023-10-12 MED ORDER — OLOPATADINE HCL 0.1 % OP SOLN
1.0000 [drp] | Freq: Two times a day (BID) | OPHTHALMIC | Status: DC
  Administered 2023-10-13 (×2): 1 [drp] via OPHTHALMIC
  Filled 2023-10-12: qty 5

## 2023-10-12 MED ORDER — CHLORHEXIDINE GLUCONATE CLOTH 2 % EX PADS
6.0000 | MEDICATED_PAD | Freq: Every day | CUTANEOUS | Status: DC
  Administered 2023-10-12 – 2023-10-13 (×2): 6 via TOPICAL

## 2023-10-12 MED ORDER — CARVEDILOL 12.5 MG PO TABS
12.5000 mg | ORAL_TABLET | Freq: Two times a day (BID) | ORAL | Status: DC
  Administered 2023-10-12 – 2023-10-13 (×3): 12.5 mg via ORAL
  Filled 2023-10-12 (×3): qty 1

## 2023-10-12 MED ORDER — HYDRALAZINE HCL 10 MG PO TABS
10.0000 mg | ORAL_TABLET | Freq: Three times a day (TID) | ORAL | Status: DC
  Administered 2023-10-13: 10 mg via ORAL
  Filled 2023-10-12: qty 1

## 2023-10-12 MED ORDER — LORATADINE 10 MG PO TABS
10.0000 mg | ORAL_TABLET | Freq: Every day | ORAL | Status: DC
  Administered 2023-10-13: 10 mg via ORAL
  Filled 2023-10-12: qty 1

## 2023-10-12 MED ORDER — HYDRALAZINE HCL 10 MG PO TABS
10.0000 mg | ORAL_TABLET | Freq: Three times a day (TID) | ORAL | Status: DC
Start: 2023-10-12 — End: 2023-10-12

## 2023-10-12 MED ORDER — CALCIUM ACETATE (PHOS BINDER) 667 MG PO CAPS
667.0000 mg | ORAL_CAPSULE | ORAL | Status: AC
  Administered 2023-10-12: 667 mg via ORAL
  Filled 2023-10-12: qty 1

## 2023-10-12 MED ORDER — AMLODIPINE BESYLATE 5 MG PO TABS
5.0000 mg | ORAL_TABLET | Freq: Every evening | ORAL | Status: DC
  Administered 2023-10-12 – 2023-10-13 (×2): 5 mg via ORAL
  Filled 2023-10-12 (×2): qty 1

## 2023-10-12 MED ORDER — SODIUM CHLORIDE 0.9 % IV SOLN
100.0000 mg | Freq: Two times a day (BID) | INTRAVENOUS | Status: DC
  Administered 2023-10-13: 100 mg via INTRAVENOUS
  Filled 2023-10-12 (×2): qty 100

## 2023-10-12 MED ORDER — APIXABAN 5 MG PO TABS
5.0000 mg | ORAL_TABLET | Freq: Two times a day (BID) | ORAL | Status: DC
  Administered 2023-10-12 – 2023-10-13 (×4): 5 mg via ORAL
  Filled 2023-10-12 (×4): qty 1

## 2023-10-12 MED ORDER — AZELASTINE HCL 0.1 % NA SOLN
2.0000 | Freq: Two times a day (BID) | NASAL | Status: DC
  Administered 2023-10-13: 2 via NASAL
  Filled 2023-10-12: qty 30

## 2023-10-12 MED ORDER — SODIUM CHLORIDE 0.9 % IV SOLN
2.0000 g | INTRAVENOUS | Status: DC
  Administered 2023-10-13: 2 g via INTRAVENOUS
  Filled 2023-10-12 (×2): qty 20

## 2023-10-12 NOTE — Telephone Encounter (Signed)
 Patient is currently inpatient at [facility]

## 2023-10-12 NOTE — Progress Notes (Signed)
 Patient seen and examined. Full consult note to follow.  Collect sputum culture, empiric antibiotics for presumed bronchitis vs sinusitis.  No opacities, no ILD on CT scan. Needs HD today.  Steffanie Dunn, DO 10/12/23 8:21 AM [facility]  For contact information, see Amion. If no response to pager, please call PCCM consult pager. After hours, 7PM- 7AM, please call Elink.

## 2023-10-12 NOTE — Progress Notes (Signed)
 Completed 3.5 hours of dialysis. Tolerated fluid net removal of 2.6L. VS stable. No significant events. 3K+ dialysate bath used for K level of 3.6. RUE AVG deaccessed. Manual pressure applied to sites until hemostasis was achieved. Sites secured with gauze/taped. Patient in stable condition upon leaving dialysis unit.  Handoff report to [place]

## 2023-10-12 NOTE — Evaluation (Signed)
 Physical Therapy Evaluation Patient Details Name: Paula [name] MRN: [id number] DOB: Feb 16, 1946 Today's Date: 10/12/2023  History of Present Illness  Pt is a 78 y/o F admitted on 10/11/23 after presenting with c/o blurred vision & cough x 5 days. Pt is being treated for acute respiratory failure with hypoxia. PMH: diastolic CHF, asthma, DM2, ESRD on HD MWF, GERD, HLD, HTN, neuropathy  Clinical Impression  Pt seen for PT evaluation with daughter present for session. Prior to admission pt was living with daughter in apartment with flight of stairs to enter. Pt was receiving HHPT. On this date, pt initially declined participation 2/2 fatigue but then agreeable to standing EOB. Pt is able to complete bed mobility & STS with mod I without AD, taking 1 step to R without AD. Pt declined further mobility 2/2 fatigue. PT educated pt on importance of continued mobility & risk of deconditioning with limited mobility, as well as need to sit upright vs lie in bed all day to allow improved breathing/lung expansion. Also encouraged pt & daughter to schedule HHPT on days that pt does not attend dialysis so she will have more energy to participate. Will continue to follow pt acutely to progress gait, balance, endurance.       If plan is discharge home, recommend the following: A little help with walking and/or transfers;A little help with bathing/dressing/bathroom;Assistance with cooking/housework;Assist for transportation;Help with stairs or ramp for entrance   Can travel by private vehicle        Equipment Recommendations None recommended by PT  Recommendations for Other Services       Functional Status Assessment Patient has had a recent decline in their functional status and demonstrates the ability to make significant improvements in function in a reasonable and predictable amount of time.     Precautions / Restrictions Precautions Precautions: Fall Restrictions Weight Bearing Restrictions Per  Provider Order: No      Mobility  Bed Mobility Overal bed mobility: Modified Independent             General bed mobility comments: supine<>sit with HOB elevated    Transfers Overall transfer level: Modified independent                 General transfer comment: STS from EOB without AD (pt preference)    Ambulation/Gait Ambulation/Gait assistance:  (pt declines)                Stairs            Wheelchair Mobility     Tilt Bed    Modified Rankin (Stroke Patients Only)       Balance Overall balance assessment: Mild deficits observed, not formally tested                                           Pertinent Vitals/Pain Pain Assessment Pain Assessment: No/denies pain    Home Living Family/patient expects to be discharged to:: Private residence Living Arrangements: Children Available Help at Discharge: Family;Available 24 hours/day Type of Home: Apartment Home Access: Stairs to enter Entrance Stairs-Rails: Right Entrance Stairs-Number of Steps: 14   Home Layout: One level Home Equipment: Patent examiner (4 wheels);Grab bars - tub/shower;Hand held shower head;Shower seat      Prior Function Prior Level of Function : Needs assist             Mobility Comments: Denies  falls in the past 6 months, usually able to negotiate flight of stairs without assistance, ambulates with rollator. ADLs Comments: Daughter intermittently assists with ADL. Needs more assist on dialysis days "to be on time" per pt. medical transport to dialysis. daughter does the cooking and the cleaning. daughter assists with medication management and prepares them in a cup next to the bedside     Extremity/Trunk Assessment   Upper Extremity Assessment Upper Extremity Assessment: Overall WFL for tasks assessed    Lower Extremity Assessment Lower Extremity Assessment: Overall WFL for tasks assessed;Generalized weakness    Cervical / Trunk  Assessment Cervical / Trunk Assessment: Normal  Communication   Communication Communication: No apparent difficulties    Cognition Arousal: Alert Behavior During Therapy: WFL for tasks assessed/performed   PT - Cognitive impairments: No apparent impairments                         Following commands: Intact       Cueing       General Comments General comments (skin integrity, edema, etc.): Pt on 4L/min via nasal cannula, SpO2 >90% throughout.    Exercises     Assessment/Plan    PT Assessment Patient needs continued PT services  PT Problem List Decreased strength;Cardiopulmonary status limiting activity;Decreased activity tolerance;Decreased balance;Decreased mobility       PT Treatment Interventions Balance training;DME instruction;Gait training;Neuromuscular re-education;Stair training;Functional mobility training;Therapeutic activities;Therapeutic exercise;Manual techniques;Patient/family education    PT Goals (Current goals can be found in the Care Plan section)  Acute Rehab PT Goals Patient Stated Goal: get better, rest PT Goal Formulation: With patient/family Time For Goal Achievement: 10/26/23 Potential to Achieve Goals: Good    Frequency Min 1X/week     Co-evaluation               AM-PAC PT "6 Clicks" Mobility  Outcome Measure Help needed turning from your back to your side while in a flat bed without using bedrails?: None Help needed moving from lying on your back to sitting on the side of a flat bed without using bedrails?: None Help needed moving to and from a bed to a chair (including a wheelchair)?: A Little Help needed standing up from a chair using your arms (e.g., wheelchair or bedside chair)?: None Help needed to walk in hospital room?: A Little Help needed climbing 3-5 steps with a railing? : A Lot 6 Click Score: 20    End of Session Equipment Utilized During Treatment: Oxygen Activity Tolerance: Patient limited by  fatigue Patient left: in bed;with call bell/phone within reach;with family/visitor present Nurse Communication: Mobility status PT Visit Diagnosis: Muscle weakness (generalized) (M62.81);Other abnormalities of gait and mobility (R26.89)    Time: 3086-5784 PT Time Calculation (min) (ACUTE ONLY): 12 min   Charges:   PT Evaluation $PT Eval Low Complexity: 1 Low   PT General Charges $$ ACUTE PT VISIT: 1 Visit         Aleda Grana, PT, DPT 10/12/23, 10:55 AM   Sandi Mariscal 10/12/2023, 10:53 AM

## 2023-10-12 NOTE — Consult Note (Signed)
 NAME:  Paula [name], MRN:  [id number], DOB:  [birth year], LOS: 0 ADMISSION DATE:  10/11/2023, CONSULTATION DATE:  3/3 REFERRING MD:  D[place]-TRH, CHIEF COMPLAINT:  hypoxia   History of Present Illness:  Paula [name] is a 78 y/o woman who presented for blurry vision. She has recently been having episodes where her blood pressure drops halfway through dialysis and she has had similar episodes of blurry vision.  For the last 1 to 2 weeks she has had increasing cough, similar to when she had pneumonia last month.  Recently she has had with nasal drip and clear phlegm.  Delsym helps her symptoms.  No vomiting other than episodes of her blood pressure drops that HD.  She did not have blood pressure med changes during her last admission.  No pelvic pain or dysuria.  No rashes or hot swollen joints.  No new symptoms otherwise.  She presented with saturations 92% on RA, at one point overnight was on NRB and CPAP. In the ED had saturations of 88%, placed on 3L [place]. PCCM was consulted for hypoxia. ESRD- last HD on Friday.  Previously seen in pulmonology clinic 08/2022 for cough felt to be due to upper airway cough syndrome. Had normal spirometry in 2020, normal PFTs in 2019.    Pertinent  Medical History  DM2 Afib HFpEF HTN GERD ESRD iHD on MWF   Significant Hospital Events: Including procedures, antibiotic start and stop dates in addition to other pertinent events   3/2 presented to the ED with saturations in the 80s.  Interim History / Subjective:    Objective   Blood pressure (!) 161/62, pulse 78, temperature 98 F (36.7 C), temperature source Oral, resp. rate 20, SpO2 95%.       No intake or output data in the 24 hours ending 10/12/23 0741 There were no vitals filed for this visit.  Examination: General: Elderly woman lying in bed no acute distress, sleeping HENT: [place]/AT, eyes anicteric Lungs: Breathing comfortably on nasal cannula, no observed coughing.  No  wheezing. Cardiovascular: S1-S2, regular rate and rhythm Abdomen: Obese, soft, nontender Extremities: Mild edema Neuro: Arouses easily to verbal stimulation, answering questions appropriately moving all extremities Derm: Warm, dry, no diffuse rashes  CT chest personally reviewed> no PE, but high right sided pressures with reflux of contrast down IVC into liver. Diffuse GG appearance of lungs, but may be influenced by contrasted CT scan. No opacities. No fibrosis.   BNP 828 7.43/43/51/28 Na+ 132 BUN 60 Cr 7.68 PT 0.61 WBC 6.6 H/H 8.9/26.3 Platelets 154    Resolved Hospital Problem list     Assessment & Plan:  Acute respiratory failure with hypoxia; although pulmonary symptoms suggest potential infectious pulmonary source, no lobar consolidations were seen on CT scan.  Cannot rule out bronchitis versus sinusitis exacerbating upper airway cough syndrome, although this would not fully explain hypoxia.  Missing dialysis and mild hypervolemia could potentially explain his, although she has not yet missed a dialysis session.  Chronic right heart failure and pulmonary hypertension related to end-stage renal disease is possible. There are no findings to suggest ILD on her CT scan. -Consider echocardiogram given RV findings on CT -Check procalcitonin, empiric treatment for respiratory source of infection-sinusitis versus bronchitis -Collect sputum culture - Bronchodilators--Dulera -Cough suppressants -Dialysis for volume management  Chronic upper airway cough syndrome - Recommend continuing azelastine, Flonase, antihistamine  Best Practice (right click and "Reselect all SmartList Selections" daily)   Per primary  Labs   CBC: Recent Labs  Lab 10/11/23 1530 10/11/23 2356 10/12/23 0425  WBC 7.1  --  6.6  NEUTROABS 4.8  --   --   HGB 10.3* 9.5* 8.9*  HCT 30.8* 28.0* 26.3*  MCV 89.0  --  89.5  PLT 183  --  154    Basic Metabolic Panel: Recent Labs  Lab 10/11/23 1530  10/11/23 2356 10/12/23 0425  NA 133* 132* 132*  K 3.5 3.6 3.6  CL 93*  --  92*  CO2 29  --  28  GLUCOSE 191*  --  184*  BUN 54*  --  60*  CREATININE 6.94*  --  7.68*  CALCIUM 9.1  --  8.6*  MG  --   --  2.4  PHOS  --   --  4.4   GFR: CrCl cannot be calculated (Unknown ideal weight.). Recent Labs  Lab 10/11/23 1530 10/12/23 0425  WBC 7.1 6.6    Liver Function Tests: Recent Labs  Lab 10/11/23 1530 10/12/23 0425  AST 16 11*  ALT 16 14  ALKPHOS 89 87  BILITOT 0.6 0.9  PROT 7.1 6.2*  ALBUMIN 3.5 3.1*   No results for input(s): "LIPASE", "AMYLASE" in the last 168 hours. No results for input(s): "AMMONIA" in the last 168 hours.  ABG    Component Value Date/Time   PHART 7.425 10/11/2023 2356   PCO2ART 42.5 10/11/2023 2356   PO2ART 51 (L) 10/11/2023 2356   HCO3 27.9 10/11/2023 2356   TCO2 29 10/11/2023 2356   O2SAT 87 10/11/2023 2356     Coagulation Profile: No results for input(s): "INR", "PROTIME" in the last 168 hours.  Cardiac Enzymes: No results for input(s): "CKTOTAL", "CKMB", "CKMBINDEX", "TROPONINI" in the last 168 hours.  HbA1C: HbA1c, POC (controlled diabetic range)  Date/Time Value Ref Range Status  04/16/2023 02:23 PM 6.5 0.0 - 7.0 % Final  11/20/2022 11:15 AM 6.6 0.0 - 7.0 % Final   Hgb A1c MFr Bld  Date/Time Value Ref Range Status  08/17/2023 08:06 AM 7.4 (H) 4.8 - 5.6 % Final    Comment:    (NOTE)         Prediabetes: 5.7 - 6.4         Diabetes: >6.4         Glycemic control for adults with diabetes: <7.0     CBG: Recent Labs  Lab 10/11/23 1505 10/11/23 2107 10/12/23 0019 10/12/23 0429  GLUCAP 186* 258* 209* 173*    Review of Systems:   Review of Systems  Constitutional:  Negative for chills and fever.  HENT:  Positive for congestion.        Postnasal drip  Eyes:  Positive for blurred vision.  Respiratory:  Positive for cough and sputum production. Negative for wheezing.   Cardiovascular:  Negative for leg swelling.   Musculoskeletal: Negative.   Skin:  Negative for rash.     Past Medical History:  She,  has a past medical history of Allergic rhinitis, Allergy (1/12000), Arthritis, Asthma, Brain aneurysm, Cough, Diabetes mellitus, Diastolic CHF, chronic (HCC) (10/11/2011), ESRD on hemodialysis (HCC), GERD (gastroesophageal reflux disease), History of colon polyps (2012), Hyperlipidemia, Hypertension, Neuropathy (10/11/2011), and PONV (postoperative nausea and vomiting).   Surgical History:   Past Surgical History:  Procedure Laterality Date   AV FISTULA PLACEMENT Right 10/15/2021   Procedure: ARTERIOVENOUS (AV) FISTULA  WITH PLACEMENT OF GORE-TEX STRETCH  GRAFT (4-55mmx45cm);  Surgeon: Chuck Hint, MD;  Location: [facility] OR;  Service: Vascular;  Laterality: Right;   CATARACT  EXTRACTION     right eye   IR 3D INDEPENDENT WKST  12/14/2017   IR ANGIO INTRA EXTRACRAN SEL COM CAROTID INNOMINATE BILAT MOD SED  09/15/2017   IR ANGIO INTRA EXTRACRAN SEL COM CAROTID INNOMINATE UNI L MOD SED  12/14/2017   IR ANGIO VERTEBRAL SEL VERTEBRAL BILAT MOD SED  09/15/2017   IR RADIOLOGIST EVAL & MGMT  09/10/2017   IR RADIOLOGIST EVAL & MGMT  10/19/2017   lymphatic mass surgery     NASAL TURBINATE REDUCTION     RADIOLOGY WITH ANESTHESIA N/A 12/14/2017   Procedure: RADIOLOGY WITH ANESTHESIA EMBOLIZATION;  Surgeon: Julieanne Cotton, MD;  Location: [facility] OR;  Service: Radiology;  Laterality: N/A;   VIDEO BRONCHOSCOPY Bilateral 11/18/2017   Procedure: VIDEO BRONCHOSCOPY WITHOUT FLUORO;  Surgeon: Leslye Peer, MD;  Location: [facility] ENDOSCOPY;  Service: Cardiopulmonary;  Laterality: Bilateral;     Social History:   reports that she has never smoked. She has never been exposed to tobacco smoke. She has never used smokeless tobacco. She reports that she does not drink alcohol and does not use drugs.   Family History:  Her family history includes Asthma in her sister; Hypertension in her mother. There is no history of  Colon cancer, Allergic rhinitis, or Eczema.   Allergies Allergies  Allergen Reactions   Dust Mite Extract Cough   Peanut-Containing Drug Products Cough   Ace Inhibitors Cough   Insulins     Reports fainting, intolerance   Lovastatin Other (See Comments)    Generalized body pain     Home Medications  Prior to Admission medications   Medication Sig Start Date End Date Taking? Authorizing Provider  acetaminophen (TYLENOL) 500 MG tablet Take 500 mg by mouth every 6 (six) hours as needed for fever, moderate pain or mild pain.   Yes [provider]  albuterol (PROAIR HFA) 108 (90 Base) MCG/ACT inhaler Inhale 1-2 puffs into the lungs every 6 (six) hours as needed for wheezing or shortness of breath. 04/16/23  Yes Anders Simmonds, PA-C  albuterol (PROVENTIL) (2.5 MG/3ML) 0.083% nebulizer solution Take 2.5 mg by nebulization every 6 (six) hours as needed for wheezing or shortness of breath.   Yes [provider]  amLODipine (NORVASC) 5 MG tablet Take 1 tablet (5 mg total) by mouth daily. 09/02/23  Yes Hoy Register, MD  apixaban (ELIQUIS) 5 MG TABS tablet Take 1 tablet (5 mg total) by mouth 2 (two) times daily. 04/02/23  Yes Hoy Register, MD  atorvastatin (LIPITOR) 80 MG tablet Take 1 tablet (80 mg total) by mouth daily. 07/16/23  Yes Hoy Register, MD  azelastine (ASTELIN) 0.1 % nasal spray Place 2 sprays into both nostrils 2 (two) times daily. Use in each nostril as directed Patient taking differently: Place 2 sprays into both nostrils daily. Use in each nostril as directed 01/08/23  Yes Hoy Register, MD  calcium acetate (PHOSLO) 667 MG capsule Take 1 capsule (667 mg total) by mouth 3 (three) times daily with meals. 07/17/23  Yes   carvedilol (COREG) 12.5 MG tablet Take 1 tablet (12.5 mg total) by mouth 2 (two) times daily with a meal. (Take once a day in the evening on hemodialysis days) 10/09/23  Yes Newlin, Enobong, MD  fluticasone (FLONASE) 50 MCG/ACT nasal spray Place 2  sprays into both nostrils daily. 01/08/23  Yes Newlin, Odette Horns, MD  fluticasone-salmeterol (ADVAIR) 100-50 MCG/ACT AEPB Inhale 1 puff into the lungs 2 (two) times daily. 08/20/23 11/18/23 Yes [place], Alvira Philips, DO  gabapentin (NEURONTIN) 100 MG capsule Take 1 capsule (100 mg total) by mouth 3 (three) times daily. 11/25/22  Yes Newlin, Odette Horns, MD  glipiZIDE (GLUCOTROL XL) 2.5 MG 24 hr tablet Take 1 tablet (2.5 mg total) by mouth daily with breakfast. (needs office visit) Patient taking differently: Take 2.5 mg by mouth daily as needed (for elevated BGL). 07/16/23  Yes Hoy Register, MD  Homeopathic Products (EARACHE DROPS OT) Place 1 drop into both ears daily as needed (for ear aches).   Yes [provider]  isosorbide mononitrate (IMDUR) 60 MG 24 hr tablet Take 1 tablet (60 mg total) by mouth daily. 07/16/23  Yes Hoy Register, MD  lidocaine-prilocaine (EMLA) cream Apply a small amount to dialysis access one hour prior to dialysis. Wrap with plastic wrap. 12/10/21  Yes   linagliptin (TRADJENTA) 5 MG TABS tablet Take 1 tablet (5 mg total) by mouth daily. 08/25/23  Yes Th[place], [place], MD  loratadine (CLARITIN) 10 MG tablet Take 1 tablet (10 mg total) by mouth daily. 09/02/23  Yes Hoy Register, MD  midodrine (PROAMATINE) 10 MG tablet Take 1 tablet (10 mg total) by mouth 3 (three) times a week. Take 30 minutes prior to HD Patient taking differently: Take 10 mg by mouth See admin instructions. Take 10 mg by mouth prior to dialysis only if blood pressure is low 07/17/23  Yes   Misc. Devices MISC Nepro dialysis protein drink.  Diagnosis end-stage renal disease Patient taking differently: Take 10 g by mouth See admin instructions. Nepro dialysis protein drink (Diagnosis end-stage renal disease)- Mix 10 grams of powder and mix into food daily as needed for low protein 05/16/22  Yes Newlin, Enobong, MD  olopatadine (PATANOL) 0.1 % ophthalmic solution Place 1 drop into both eyes 2 (two) times daily. Patient  taking differently: Place 1 drop into both eyes daily. 08/14/22  Yes Storm Frisk, MD  ondansetron (ZOFRAN) 4 MG tablet Take 4 mg by mouth every 8 (eight) hours as needed for nausea or vomiting.   Yes [provider]  Throat Lozenges (HALLS COUGH DROPS MT) Use as directed 1 lozenge in the mouth or throat every 2 (two) hours as needed (for coughing).   Yes [provider]  glucose blood (ACCU-CHEK GUIDE) test strip USE AS INSTRUCTED 2 TIMES DAILY 04/16/23   Anders Simmonds, PA-C  Misc. Devices [employer]  Diagnosis - unstable gait 03/05/21   Hoy Register, MD  Misc. Devices MISC Fistula sleeve for R arm.  Diagnoses: End-stage renal disease on hemodialysis 01/09/22   Hoy Register, MD  Misc. Devices MISC 1. Incontinence supplies.  2. Briefs.  Diagnosis-urinary incontinence 04/17/22   Hoy Register, MD  Misc. Devices MISC Blood pressure monitor.  Diagnosis hypertension 05/20/22   Hoy Register, MD  montelukast (SINGULAIR) 10 MG tablet Take 1 tablet (10 mg total) by mouth at bedtime. Patient not taking: Reported on 10/11/2023 08/20/23 11/18/23  [place], Eric J, DO     Critical care time:      Steffanie Dunn, DO 10/12/23 7:50 AM [facility]  For contact information, see Amion. If no response to pager, please call PCCM consult pager. After hours, 7PM- 7AM, please call Elink.

## 2023-10-12 NOTE — ED Notes (Signed)
 Pt removed CPAP and refusing  to go back on it. She is requesting the NRB mask. MD notified.

## 2023-10-12 NOTE — ED Notes (Signed)
 Provider at bedside

## 2023-10-12 NOTE — Consult Note (Addendum)
 Renal Service Consult Note [facility]  [address] 10/12/2023 Maree Krabbe, MD Requesting Physician: Dr. Jomarie Longs  Reason for Consult: ESRD pt w/ blurred vision, cough and feeling unwell HPI: The patient is a 78 y.o. year-old w/ PMH as below who presented to ED c/o symptoms as above. Coughing for the last 5 days, last admit 1 mo ago for PNA. On HD last HD was Friday. In ED hypoxic mid 80s on RA, started on 3L. Not on home O2. CXR was clear. Seen by CCM in ED, they suspected bronchitis and/or sinusitis. CT was neg for opacities. Recommended HD as well. Pt admitted. We are asked to see for dialysis.    Pt seen in ED room. Just finished a short walk w/ PT.  Pt gives hx as above. Denies any HD issues recently. Per the family pt has been taking home nebulizers and a couple other lung meds via inhalation for the last 2-3 years. She is not on home O2. She sees Dr Delton Coombes w/ pulm team.    PMH Asthma Brain aneurysm DM2 Chronic diastolic CHF ESRD on HD HL HTN   ROS - denies CP, no joint pain, no rash, no diarrhea, no nausea/ vomiting   Past Medical History  Past Medical History:  Diagnosis Date   Allergic rhinitis    Allergy 1/12000   Arthritis    Asthma    Brain aneurysm    Cough    Diabetes mellitus    Diastolic CHF, chronic (HCC) 10/11/2011   ESRD on hemodialysis (HCC)    MWF   GERD (gastroesophageal reflux disease)    History of colon polyps 2012   tubular adenoma    Hyperlipidemia    Hypertension    Neuropathy 10/11/2011   PONV (postoperative nausea and vomiting)    one time after lymph node surgery   Past Surgical History  Past Surgical History:  Procedure Laterality Date   AV FISTULA PLACEMENT Right 10/15/2021   Procedure: ARTERIOVENOUS (AV) FISTULA  WITH PLACEMENT OF GORE-TEX STRETCH  GRAFT (4-40mmx45cm);  Surgeon: Chuck Hint, MD;  Location: [facility] OR;  Service: Vascular;  Laterality: Right;   CATARACT EXTRACTION     right eye   IR 3D  INDEPENDENT WKST  12/14/2017   IR ANGIO INTRA EXTRACRAN SEL COM CAROTID INNOMINATE BILAT MOD SED  09/15/2017   IR ANGIO INTRA EXTRACRAN SEL COM CAROTID INNOMINATE UNI L MOD SED  12/14/2017   IR ANGIO VERTEBRAL SEL VERTEBRAL BILAT MOD SED  09/15/2017   IR RADIOLOGIST EVAL & MGMT  09/10/2017   IR RADIOLOGIST EVAL & MGMT  10/19/2017   lymphatic mass surgery     NASAL TURBINATE REDUCTION     RADIOLOGY WITH ANESTHESIA N/A 12/14/2017   Procedure: RADIOLOGY WITH ANESTHESIA EMBOLIZATION;  Surgeon: Julieanne Cotton, MD;  Location: [facility] OR;  Service: Radiology;  Laterality: N/A;   VIDEO BRONCHOSCOPY Bilateral 11/18/2017   Procedure: VIDEO BRONCHOSCOPY WITHOUT FLUORO;  Surgeon: Leslye Peer, MD;  Location: [facility];  Service: Cardiopulmonary;  Laterality: Bilateral;   Family History  Family History  Problem Relation Age of Onset   Hypertension Mother    Asthma Sister    Colon cancer Neg Hx    Allergic rhinitis Neg Hx    Eczema Neg Hx    Social History  reports that she has never smoked. She has never been exposed to tobacco smoke. She has never used smokeless tobacco. She reports that she does not drink alcohol and does not use  drugs. Allergies  Allergies  Allergen Reactions   Dust Mite Extract Cough   Peanut-Containing Drug Products Cough   Ace Inhibitors Cough   Insulins     Reports fainting, intolerance   Lovastatin Other (See Comments)    Generalized body pain   Home medications Prior to Admission medications   Medication Sig Start Date End Date Taking? Authorizing Provider  acetaminophen (TYLENOL) 500 MG tablet Take 500 mg by mouth every 6 (six) hours as needed for fever, moderate pain or mild pain.   Yes [provider]  albuterol (PROAIR HFA) 108 (90 Base) MCG/ACT inhaler Inhale 1-2 puffs into the lungs every 6 (six) hours as needed for wheezing or shortness of breath. 04/16/23  Yes Anders Simmonds, PA-C  albuterol (PROVENTIL) (2.5 MG/3ML) 0.083% nebulizer solution  Take 2.5 mg by nebulization every 6 (six) hours as needed for wheezing or shortness of breath.   Yes [provider]  amLODipine (NORVASC) 5 MG tablet Take 1 tablet (5 mg total) by mouth daily. 09/02/23  Yes Hoy Register, MD  apixaban (ELIQUIS) 5 MG TABS tablet Take 1 tablet (5 mg total) by mouth 2 (two) times daily. 04/02/23  Yes Hoy Register, MD  atorvastatin (LIPITOR) 80 MG tablet Take 1 tablet (80 mg total) by mouth daily. 07/16/23  Yes Hoy Register, MD  azelastine (ASTELIN) 0.1 % nasal spray Place 2 sprays into both nostrils 2 (two) times daily. Use in each nostril as directed Patient taking differently: Place 2 sprays into both nostrils daily. Use in each nostril as directed 01/08/23  Yes Hoy Register, MD  calcium acetate (PHOSLO) 667 MG capsule Take 1 capsule (667 mg total) by mouth 3 (three) times daily with meals. 07/17/23  Yes   carvedilol (COREG) 12.5 MG tablet Take 1 tablet (12.5 mg total) by mouth 2 (two) times daily with a meal. (Take once a day in the evening on hemodialysis days) 10/09/23  Yes Newlin, Enobong, MD  fluticasone (FLONASE) 50 MCG/ACT nasal spray Place 2 sprays into both nostrils daily. 01/08/23  Yes Newlin, Odette Horns, MD  fluticasone-salmeterol (ADVAIR) 100-50 MCG/ACT AEPB Inhale 1 puff into the lungs 2 (two) times daily. 08/20/23 11/18/23 Yes [place], Eric J, DO  gabapentin (NEURONTIN) 100 MG capsule Take 1 capsule (100 mg total) by mouth 3 (three) times daily. 11/25/22  Yes Newlin, Odette Horns, MD  glipiZIDE (GLUCOTROL XL) 2.5 MG 24 hr tablet Take 1 tablet (2.5 mg total) by mouth daily with breakfast. (needs office visit) Patient taking differently: Take 2.5 mg by mouth daily as needed (for elevated BGL). 07/16/23  Yes Hoy Register, MD  Homeopathic Products (EARACHE DROPS OT) Place 1 drop into both ears daily as needed (for ear aches).   Yes [provider]  isosorbide mononitrate (IMDUR) 60 MG 24 hr tablet Take 1 tablet (60 mg total) by mouth daily. 07/16/23   Yes Hoy Register, MD  lidocaine-prilocaine (EMLA) cream Apply a small amount to dialysis access one hour prior to dialysis. Wrap with plastic wrap. 12/10/21  Yes   linagliptin (TRADJENTA) 5 MG TABS tablet Take 1 tablet (5 mg total) by mouth daily. 08/25/23  Yes Th[place], [place], MD  loratadine (CLARITIN) 10 MG tablet Take 1 tablet (10 mg total) by mouth daily. 09/02/23  Yes Hoy Register, MD  midodrine (PROAMATINE) 10 MG tablet Take 1 tablet (10 mg total) by mouth 3 (three) times a week. Take 30 minutes prior to HD Patient taking differently: Take 10 mg by mouth See admin instructions. Take 10 mg  by mouth prior to dialysis only if blood pressure is low 07/17/23  Yes   Misc. Devices MISC Nepro dialysis protein drink.  Diagnosis end-stage renal disease Patient taking differently: Take 10 g by mouth See admin instructions. Nepro dialysis protein drink (Diagnosis end-stage renal disease)- Mix 10 grams of powder and mix into food daily as needed for low protein 05/16/22  Yes Newlin, Enobong, MD  olopatadine (PATANOL) 0.1 % ophthalmic solution Place 1 drop into both eyes 2 (two) times daily. Patient taking differently: Place 1 drop into both eyes daily. 08/14/22  Yes Storm Frisk, MD  ondansetron (ZOFRAN) 4 MG tablet Take 4 mg by mouth every 8 (eight) hours as needed for nausea or vomiting.   Yes [provider]  Throat Lozenges (HALLS COUGH DROPS MT) Use as directed 1 lozenge in the mouth or throat every 2 (two) hours as needed (for coughing).   Yes [provider]  glucose blood (ACCU-CHEK GUIDE) test strip USE AS INSTRUCTED 2 TIMES DAILY 04/16/23   Anders Simmonds, PA-C  Misc. Devices [employer]  Diagnosis - unstable gait 03/05/21   Hoy Register, MD  Misc. Devices MISC Fistula sleeve for R arm.  Diagnoses: End-stage renal disease on hemodialysis 01/09/22   Hoy Register, MD  Misc. Devices MISC 1. Incontinence supplies.  2. Briefs.  Diagnosis-urinary incontinence 04/17/22   Hoy Register, MD  Misc. Devices MISC Blood pressure monitor.  Diagnosis hypertension 05/20/22   Hoy Register, MD  montelukast (SINGULAIR) 10 MG tablet Take 1 tablet (10 mg total) by mouth at bedtime. Patient not taking: Reported on 10/11/2023 08/20/23 11/18/23  [place], Eric J, [place]     Vitals:   10/12/23 0815 10/12/23 0844 10/12/23 1130 10/12/23 1231  BP:  (!) 167/70 (!) 146/77 (!) 168/68  Pulse:  75 72 72  Resp:  (!) 25 18 17   Temp: 98 F (36.7 C)   97.7 F (36.5 C)  TempSrc: Oral     SpO2:  94% 100% 97%   Exam Gen alert, no distress No rash, cyanosis or gangrene Sclera anicteric, throat clear  No jvd or bruits Chest occ rhonchi bilat, no rales/ wheezing RRR no MRG Abd soft ntnd no mass or ascites +bs GU defer MS no joint effusions or deformity Ext no LE or UE edema, no other edema Neuro is alert, Ox 3 , nf     RUA AVG+Bruit    Renal-related home meds: - pend    OP HD: SW MWF 3h  B400  72.5kg  2K bath  AVG  Heparin none - mircera 60 mcg q 2 wks, last 2/24, due 3/10 - hectorol 2 mcg IV three times per week - last OP HD 2/28, post wt 72.4kg, gets to dry wt    Assessment/ Plan: Acute hypoxic resp failure - hx asthma, f/b pulm in OP setting. Seen in ED by CCM, suspecting sinusitis/ bronchitis. No infiltrates on CXR. Possibly some vasc congestion. Max UF w/ HD today/ this evening.  ESRD - on HD MWF. Has not missed HD. HD today.  HTN - BP's a bit high, cont home meds Volume - getting to dry wt at OP unit. Not grossly vol^ on exam.  Anemia of esrd - Hb 9-11 here, esa due on 3/10.  Secondary hyperparathyroidism - CCa and phos in range. Cont binders w/ meals.  Atrial fib - per pmd      Vinson Moselle  MD [facility] 10/12/2023, 5:13 PM  Recent Labs  Lab 10/11/23  1530 10/11/23 2356 10/12/23 0425  HGB 10.3* 9.5* 8.9*  ALBUMIN 3.5  --  3.1*  CALCIUM 9.1  --  8.6*  PHOS  --   --  4.4  CREATININE 6.94*  --  7.68*  K 3.5 3.6 3.6   Inpatient medications:  amLODipine  5 mg Oral  QPM   apixaban  5 mg Oral BID   atorvastatin  80 mg Oral Daily   [START ON 10/13/2023] calcium acetate  667 mg Oral TID WC   carvedilol  12.5 mg Oral BID   Chlorhexidine Gluconate Cloth  6 each Topical Q0600   guaiFENesin  600 mg Oral BID   [START ON 10/13/2023] hydrALAZINE  10 mg Oral TID   LORazepam  0.5 mg Oral Once   mometasone-formoterol  2 puff Inhalation BID   olopatadine  1 drop Both Eyes BID   sodium chloride flush  3 mL Intravenous Q12H    sodium chloride     cefTRIAXone (ROCEPHIN)  IV     doxycycline (VIBRAMYCIN) IV     sodium chloride, acetaminophen **OR** acetaminophen, albuterol, HYDROcodone-acetaminophen, ondansetron **OR** ondansetron (ZOFRAN) IV, sodium chloride flush

## 2023-10-12 NOTE — Evaluation (Signed)
 Occupational Therapy Evaluation Patient Details Name: Paula [name] MRN: [id number] DOB: 12/29/1945 Today's Date: 10/12/2023   History of Present Illness   78 y.o. female adm 3/2 with medical history significant of diastolic CHF, asthma, DM2, end-stage renal disease on hemodialysis Monday Wednesday Friday, GERD, HLD, HTN, neuropathy.  Presented with feeling unwell blurred vision.     Clinical Impressions Patient admitted for the diagnosis above.  PTA she lives with her daughter in a local apartment.  Unfortunately she needs to navigate 14 STE their apartment.  At baseline she does need support from the daughter for ADL and to ascent stairs, particularly on HD days.  She presents very close to her baseline, with occasional O2 support.  No real OT needs exist in the acute setting, although she would benefit from continued mobility in the acute setting.  PT Consult is pending.  Patient stated she just finished [facility] PT after an admission in January s/p pneumonia.        If plan is discharge home, recommend the following:   Assist for transportation;A little help with bathing/dressing/bathroom;A little help with walking and/or transfers     Functional Status Assessment   Patient has not had a recent decline in their functional status     Equipment Recommendations   None recommended by OT     Recommendations for Other Services         Precautions/Restrictions   Precautions Precautions: Fall Recall of Precautions/Restrictions: Intact Restrictions Weight Bearing Restrictions Per Provider Order: No     Mobility Bed Mobility Overal bed mobility: Modified Independent                  Transfers Overall transfer level: Modified independent Equipment used: Rolling walker (2 wheels)                      Balance Overall balance assessment: Mild deficits observed, not formally tested                                         ADL either  performed or assessed with clinical judgement   ADL Overall ADL's : At baseline                                             Vision Patient Visual Report: No change from baseline       Perception Perception: Not tested       Praxis Praxis: Not tested       Pertinent Vitals/Pain Pain Assessment Pain Assessment: Faces Faces Pain Scale: Hurts a little bit Pain Location: chronic back pain Pain Descriptors / Indicators: Aching Pain Intervention(s): Monitored during session     Extremity/Trunk Assessment Upper Extremity Assessment Upper Extremity Assessment: Overall WFL for tasks assessed   Lower Extremity Assessment Lower Extremity Assessment: Defer to PT evaluation   Cervical / Trunk Assessment Cervical / Trunk Assessment: Normal   Communication Communication Communication: No apparent difficulties   Cognition Arousal: Alert Behavior During Therapy: WFL for tasks assessed/performed Cognition: No apparent impairments                               Following commands: Intact       Cueing  General Comments       VSS on O2   Exercises     Shoulder Instructions      Home Living Family/patient expects to be discharged to:: Private residence Living Arrangements: Children Available Help at Discharge: Family;Available 24 hours/day Type of Home: Apartment Home Access: Stairs to enter Entrance Stairs-Number of Steps: 14 Entrance Stairs-Rails: Right Home Layout: One level     Bathroom Shower/Tub: Chief Strategy Officer: Standard Bathroom Accessibility: Yes   Home Equipment: Patent examiner (4 wheels);Grab bars - tub/shower;Hand held shower head;Shower seat          Prior Functioning/Environment Prior Level of Function : Needs assist             Mobility Comments: Pt states that she mostly uses rollator at home. Needs assist walking at times. ADLs Comments: Daughter intermittently assists with ADL.  Needs more assist on dialysis days "to be on time" per pt. medical transport to dialysis. daughter does the cooking and the cleaning. daughter assists with medication management and prepares them in a cup next to the bedside    OT Problem List: Decreased activity tolerance   OT Treatment/Interventions:        OT Goals(Current goals can be found in the care plan section)   Acute Rehab OT Goals Patient Stated Goal: Return home OT Goal Formulation: With patient Time For Goal Achievement: 10/26/23 Potential to Achieve Goals: Good   OT Frequency:       Co-evaluation              AM-PAC OT "6 Clicks" Daily Activity     Outcome Measure Help from another person eating meals?: None Help from another person taking care of personal grooming?: None Help from another person toileting, which includes using toliet, bedpan, or urinal?: A Little Help from another person bathing (including washing, rinsing, drying)?: A Little Help from another person to put on and taking off regular upper body clothing?: None Help from another person to put on and taking off regular lower body clothing?: A Little 6 Click Score: 21   End of Session Equipment Utilized During Treatment: Rolling walker (2 wheels) Nurse Communication: Mobility status  Activity Tolerance: Patient tolerated treatment well Patient left: in bed;with call bell/phone within reach;with family/visitor present  OT Visit Diagnosis: Low vision, both eyes (H54.2)                Time: 0865-7846 OT Time Calculation (min): 22 min Charges:  OT General Charges $OT Visit: 1 Visit OT Evaluation $OT Eval Moderate Complexity: 1 Mod  10/12/2023  RP, OTR/L  Acute Rehabilitation Services  Office:  [phone]   Suzanna Obey 10/12/2023, 9:13 AM

## 2023-10-12 NOTE — Plan of Care (Signed)
  Problem: Education: Goal: Ability to describe self-care measures that may prevent or decrease complications (Diabetes Survival Skills Education) will improve Outcome: Progressing   Problem: Fluid Volume: Goal: Ability to maintain a balanced intake and output will improve Outcome: Progressing   Problem: Metabolic: Goal: Ability to maintain appropriate glucose levels will improve Outcome: Progressing   Problem: Nutritional: Goal: Maintenance of adequate nutrition will improve Outcome: Progressing   Problem: Clinical Measurements: Goal: Will remain free from infection Outcome: Progressing Goal: Diagnostic test results will improve Outcome: Progressing

## 2023-10-12 NOTE — Progress Notes (Signed)
 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 -In the ER she was hypoxic placed on 3 L O2, at some point briefly required a nonrebreather as well, workup in the ED noted labs with creatinine 6.9, sodium 133, WBC 7, hemoglobin 10.3, BNP 827, chest x-ray and CTA chest without acute findings, flu COVID and RSV PCR were negative  Subjective: Feels better, some generalized weakness, denies dizziness or visual changes this morning  Assessment and Plan:  Acute hypoxic respiratory failure -Etiology is unclear, no acute findings on chest x-ray and CTA chest, some atelectasis noted -History of ILD, followed by pulmonary, could be contributing to some of her symptoms however CTA chest was largely unremarkable -Denies fevers, no leukocytosis, some chronic cough noted, check procalcitonin, also check SLP evaluation, I am not convinced she needs antibiotics -DuoNebs, pulmonary toilet, incentive spirometry -Wean O2 as tolerated -Increase activity, PT OT eval  ESRD on hemodialysis -Nephrology following, for HD today  Acute on chronic diastolic CHF Severe LVH -Volume managed with dialysis, continue Coreg  Uncontrolled hypertension -Continue Coreg, amlodipine -Add hydralazine  Type 2 diabetes mellitus -CBGs are stable, SSI  Paroxysmal atrial fibrillation Continue Coreg and Eliquis  Anemia of chronic disease -Trend, continue Aranesp  CAD  History  of intracranial aneurysms  DVT prophylaxis: Apixaban Code Status: Full code Family Communication: Daughter at bedside Disposition Plan: Home likely 1 to 2 days  Consultants:    Procedures:   Antimicrobials:    Objective: Vitals:   10/12/23 0545 10/12/23 0700 10/12/23 0815 10/12/23 0844  BP:  (!) 161/62  (!) 167/70  Pulse: 78   75  Resp: 19 20  (!) 25  Temp:   98 F (36.7 C)   TempSrc:   Oral   SpO2: 95%   94%   No intake or output data in the 24 hours ending 10/12/23 1208 There were no vitals filed for this visit.  Examination:  General exam: Obese chronically ill appears calm and comfortable  HEENT: Positive JVD Respiratory system: Few basilar rhonchi Cardiovascular system: S1 & S2 heard, RRR.  Abd: nondistended, soft and nontender.Normal bowel sounds heard. Central nervous system: Alert and oriented. No focal neurological deficits. Extremities: no edema Skin: No rashes Psychiatry:  Mood & affect appropriate.     Data Reviewed:   CBC: Recent Labs  Lab 10/11/23 1530 10/11/23 2356 10/12/23 0425  WBC 7.1  --  6.6  NEUTROABS 4.8  --   --   HGB 10.3* 9.5* 8.9*  HCT 30.8* 28.0* 26.3*  MCV 89.0  --  89.5  PLT 183  --  154   Basic Metabolic Panel: Recent Labs  Lab 10/11/23 1530 10/11/23 2356 10/12/23 0425  NA 133* 132* 132*  K 3.5 3.6 3.6  CL 93*  --  92*  CO2 29  --  28  GLUCOSE 191*  --  184*  BUN 54*  --  60*  CREATININE 6.94*  --  7.68*  CALCIUM 9.1  --  8.6*  MG  --   --  2.4  PHOS  --   --  4.4   GFR: CrCl cannot be calculated (Unknown ideal weight.). Liver Function Tests: Recent Labs  Lab 10/11/23 1530 10/12/23 0425  AST 16 11*  ALT 16 14  ALKPHOS 89 87  BILITOT 0.6 0.9  PROT 7.1 6.2*  ALBUMIN 3.5 3.1*   No results for input(s): "LIPASE", "AMYLASE" in the last 168 hours. No results for input(s): "AMMONIA" in the last 168 hours. Coagulation Profile: No results for input(s): "INR", "PROTIME" in the last 168 hours. Cardiac  Enzymes: No results for input(s): "CKTOTAL", "CKMB", "CKMBINDEX", "TROPONINI" in the last 168 hours. BNP (last 3 results) No results for input(s): "PROBNP" in the last 8760 hours. HbA1C: No results for input(s): "HGBA1C" in the last 72 hours. CBG: Recent Labs  Lab 10/11/23 1505 10/11/23 2107 10/12/23 0019 10/12/23 0429 10/12/23 0826  GLUCAP 186* 258* 209* 173* 132*   Lipid Profile: No results for input(s): "CHOL", "HDL", "LDLCALC", "TRIG", "CHOLHDL", "LDLDIRECT" in the last 72 hours. Thyroid Function Tests: No results for input(s): "TSH", "T4TOTAL", "FREET4", "T3FREE", "THYROIDAB" in the last 72 hours. Anemia Panel: Recent Labs    10/12/23 0425  VITAMINB12 1,163*  FOLATE 8.5   Urine analysis:    Component Value Date/Time   COLORURINE STRAW (A) 11/13/2021 0921   APPEARANCEUR CLEAR 11/13/2021 0921   LABSPEC 1.006 11/13/2021 0921   PHURINE 7.0 11/13/2021 0921   GLUCOSEU NEGATIVE 11/13/2021 0921   HGBUR NEGATIVE 11/13/2021 0921   BILIRUBINUR NEGATIVE 11/13/2021 0921   BILIRUBINUR negative 09/02/2018 1128   KETONESUR NEGATIVE 11/13/2021 0921   PROTEINUR 100 (A) 11/13/2021 0921   UROBILINOGEN 0.2 09/02/2018 1128   UROBILINOGEN 0.2 05/12/2018 1259   NITRITE NEGATIVE 11/13/2021 0921   LEUKOCYTESUR TRACE (A) 11/13/2021 0921   Sepsis Labs: @LABRCNTIP (procalcitonin:4,lacticidven:4)  ) Recent Results (from the past 240 hours)  Resp panel by RT-PCR (RSV, Flu A&B, Covid) Anterior Nasal Swab     Status: None   Collection Time: 10/11/23  4:22 PM   Specimen: Anterior Nasal Swab  Result Value Ref Range Status   SARS Coronavirus 2 by RT PCR NEGATIVE NEGATIVE Final   Influenza A by PCR NEGATIVE NEGATIVE Final   Influenza B by PCR NEGATIVE NEGATIVE Final    Comment: (NOTE) The Xpert Xpress SARS-CoV-2/FLU/RSV plus assay is intended as an aid in the diagnosis of influenza from Nasopharyngeal swab specimens and should not be used as a sole basis for treatment. Nasal washings  and aspirates are unacceptable for Xpert Xpress SARS-CoV-2/FLU/RSV testing.  Fact Sheet for Patients: BloggerCourse.com  Fact Sheet for Healthcare Providers: SeriousBroker.it  This test is not yet approved or cleared by the [place] FDA and has been authorized for detection and/or diagnosis of SARS-CoV-2 by FDA under an Emergency Use Authorization (EUA). This EUA will remain in effect (meaning this test can be used) for the duration of the COVID-19 declaration under Section 564(b)(1) of the Act, 21 U.S.C. section 360bbb-3(b)(1), unless the authorization is terminated or revoked.     Resp Syncytial Virus by PCR NEGATIVE NEGATIVE Final    Comment: (NOTE) Fact Sheet for Patients: BloggerCourse.com  Fact Sheet for Healthcare Providers: SeriousBroker.it  This test is not yet approved or cleared by the [place] FDA and has been authorized for detection and/or diagnosis of SARS-CoV-2 by FDA under an Emergency Use Authorization (EUA). This EUA will remain in effect (meaning this test can be used) for the duration of the COVID-19 declaration under Section 564(b)(1) of the Act, 21 U.S.C. section 360bbb-3(b)(1),  unless the authorization is terminated or revoked.  Performed at [facility], [address], [place], [place] [address]   Respiratory (~20 pathogens) panel by PCR     Status: None   Collection Time: 10/11/23  8:44 PM   Specimen: Nasopharyngeal Swab; Respiratory  Result Value Ref Range Status   Adenovirus NOT DETECTED NOT DETECTED Final   Coronavirus 229E NOT DETECTED NOT DETECTED Final    Comment: (NOTE) The Coronavirus on the Respiratory Panel, DOES NOT test for the novel  Coronavirus (2019 nCoV)    Coronavirus HKU1 NOT DETECTED NOT DETECTED Final   Coronavirus NL63 NOT DETECTED NOT DETECTED Final   Coronavirus OC43 NOT DETECTED NOT DETECTED Final    Metapneumovirus NOT DETECTED NOT DETECTED Final   Rhinovirus / Enterovirus NOT DETECTED NOT DETECTED Final   Influenza A NOT DETECTED NOT DETECTED Final   Influenza B NOT DETECTED NOT DETECTED Final   Parainfluenza Virus 1 NOT DETECTED NOT DETECTED Final   Parainfluenza Virus 2 NOT DETECTED NOT DETECTED Final   Parainfluenza Virus 3 NOT DETECTED NOT DETECTED Final   Parainfluenza Virus 4 NOT DETECTED NOT DETECTED Final   Respiratory Syncytial Virus NOT DETECTED NOT DETECTED Final   Bordetella pertussis NOT DETECTED NOT DETECTED Final   Bordetella Parapertussis NOT DETECTED NOT DETECTED Final   Chlamydophila pneumoniae NOT DETECTED NOT DETECTED Final   Mycoplasma pneumoniae NOT DETECTED NOT DETECTED Final    Comment: Performed at [facility], [address] [place], [place] [address]     Radiology Studies: DG CHEST PORT 1 VIEW Result Date: 10/11/2023 CLINICAL DATA:  Encounter for hypoxia EXAM: PORTABLE CHEST 1 VIEW COMPARISON:  Same day radiographs and CT FINDINGS: Stable cardiomegaly. Aortic atherosclerotic calcification. Low lung volumes accentuate pulmonary vascularity. Bibasilar atelectasis or infiltrates. No pleural effusion or pneumothorax. No displaced rib fractures. IMPRESSION: Cardiomegaly. Low lung volumes with bibasilar atelectasis or infiltrates. Electronically Signed   By: Minerva Fester M.D.   On: 10/11/2023 23:55   CT Angio Chest PE W and/or Wo Contrast Result Date: 10/11/2023 CLINICAL DATA:  Cough and blurred vision. EXAM: CT ANGIOGRAPHY CHEST WITH CONTRAST TECHNIQUE: Multidetector CT imaging of the chest was performed using the standard protocol during bolus administration of intravenous contrast. Multiplanar CT image reconstructions and MIPs were obtained to evaluate the vascular anatomy. RADIATION DOSE REDUCTION: This exam was performed according to the departmental dose-optimization program which includes automated exposure control, adjustment of the mA and/or kV  according to patient size and/or use of iterative reconstruction technique. CONTRAST:  75mL OMNIPAQUE IOHEXOL 350 MG/ML SOLN COMPARISON:  May 24, 2021 FINDINGS: Cardiovascular: There is marked severity calcification of the thoracic aorta. Satisfactory opacification of the pulmonary arteries to the segmental level. No evidence of pulmonary embolism. Normal heart size. No pericardial effusion. Mediastinum/Nodes: No enlarged mediastinal, hilar, or axillary lymph nodes. Thyroid gland, trachea, and esophagus demonstrate no significant findings. Lungs/Pleura: Mild lingular and posterior bibasilar atelectasis is seen. There is no evidence of an acute infiltrate, pleural effusion or pneumothorax. Upper Abdomen: No acute abnormality. Musculoskeletal: No chest wall abnormality. No acute or significant osseous findings. Review of the MIP images confirms the above findings. IMPRESSION: 1. No evidence of pulmonary embolism or other acute intrathoracic process. 2. Mild lingular and posterior bibasilar atelectasis. 3. Aortic atherosclerosis. Aortic Atherosclerosis (ICD10-I70.0). Electronically Signed   By: Aram Candela M.D.   On: 10/11/2023 21:11   DG Chest 2 View Result Date: 10/11/2023 CLINICAL DATA:  Cough EXAM: CHEST - 2  VIEW COMPARISON:  08/16/2023 FINDINGS: Mild cardiomegaly. Both lungs are clear. The visualized skeletal structures are unremarkable. IMPRESSION: Mild cardiomegaly.  No acute abnormality of the lungs. Electronically Signed   By: Jearld Lesch M.D.   On: 10/11/2023 15:28     Scheduled Meds:  amLODipine  5 mg Oral QPM   apixaban  5 mg Oral BID   atorvastatin  80 mg Oral Daily   carvedilol  12.5 mg Oral BID   Chlorhexidine Gluconate Cloth  6 each Topical Q0600   guaiFENesin  600 mg Oral BID   LORazepam  0.5 mg Oral Once   mometasone-formoterol  2 puff Inhalation BID   sodium chloride flush  3 mL Intravenous Q12H   Continuous Infusions:  sodium chloride     cefTRIAXone (ROCEPHIN)  IV      doxycycline (VIBRAMYCIN) IV       LOS: 0 days    Time spent:    Zannie Cove, MD [facility]   10/12/2023, 12:08 PM

## 2023-10-12 NOTE — Progress Notes (Signed)
   10/12/23 0138  BiPAP/CPAP/SIPAP  BiPAP/CPAP/SIPAP Pt Type Adult  BiPAP/CPAP/SIPAP Resmed  Mask Type Full face mask  Mask Size Medium  Respiratory Rate 17 breaths/min  EPAP 10 cmH2O  Flow Rate 8 lpm  Patient Home Equipment No  Auto Titrate No  BiPAP/CPAP /SiPAP Vitals  Pulse Rate 70  Resp 17  SpO2 98 %  Bilateral Breath Sounds Diminished  MEWS Score/Color  MEWS Score 0  MEWS Score Color Paula [name]

## 2023-10-12 NOTE — Telephone Encounter (Signed)
 Copied from CRM [id number]. Topic: Clinical - Home Health Verbal Orders >> Oct 12, 2023 11:32 AM Dennison Nancy wrote: Caller/Agency:  [facility]  Callback Number: [phone] Service Requested: Physical Therapy requesting for discharge visit  Frequency: 1 time a week for 1 week  Any new concerns about the patient? No

## 2023-10-12 NOTE — Inpatient Diabetes Management (Signed)
 Inpatient Diabetes Program Recommendations  AACE/ADA: New Consensus Statement on Inpatient Glycemic Control (2015)  Target Ranges:  Prepandial:   less than 140 mg/dL      Peak postprandial:   less than 180 mg/dL (1-2 hours)      Critically ill patients:  140 - 180 mg/dL   Lab Results  Component Value Date   GLUCAP 122 (H) 10/12/2023   HGBA1C 7.4 (H) 08/17/2023    Review of Glycemic Control  Latest Reference Range & Units 10/11/23 21:07 10/12/23 00:19 10/12/23 04:29 10/12/23 08:26 10/12/23 12:26  Glucose-Capillary 70 - 99 mg/dL 161 (H) 096 (H) 045 (H) 132 (H) 122 (H)   Diabetes history: DM 2 Outpatient Diabetes medications:  Glucotrol 2.5 mg prn elevated blood sugar Tradjenta 5 mg daily Current orders for Inpatient glycemic control:  CBG's ordered with no correciton  Inpatient Diabetes Program Recommendations:    If CBG's >150 mg/dL, may consider adding "very sensitive" Novolog 0-6 units tid with meals.   Thanks,  Lorenza Cambridge, RN, BC-ADM Inpatient Diabetes Coordinator Pager [phone]  (8a-5p)

## 2023-10-12 NOTE — ED Notes (Signed)
Pt up with PT

## 2023-10-12 NOTE — Progress Notes (Signed)
 Heart Failure Navigator Progress Note  Assessed for Heart & Vascular TOC clinic readiness.  Patient does not meet criteria due to ESRD on hemodialysis.   Navigator will sign off at this time.   Rhae Hammock, BSN, Scientist, clinical (histocompatibility and immunogenetics) Only

## 2023-10-13 ENCOUNTER — Encounter (HOSPITAL_COMMUNITY): Payer: Self-pay

## 2023-10-13 ENCOUNTER — Other Ambulatory Visit (HOSPITAL_COMMUNITY): Payer: Self-pay

## 2023-10-13 DIAGNOSIS — I10 Essential (primary) hypertension: Secondary | ICD-10-CM | POA: Diagnosis not present

## 2023-10-13 DIAGNOSIS — E119 Type 2 diabetes mellitus without complications: Secondary | ICD-10-CM | POA: Diagnosis not present

## 2023-10-13 DIAGNOSIS — I480 Paroxysmal atrial fibrillation: Secondary | ICD-10-CM | POA: Diagnosis not present

## 2023-10-13 DIAGNOSIS — J9601 Acute respiratory failure with hypoxia: Secondary | ICD-10-CM | POA: Diagnosis not present

## 2023-10-13 LAB — COMPREHENSIVE METABOLIC PANEL
ALT: 17 U/L (ref 0–44)
AST: 19 U/L (ref 15–41)
Albumin: 3.3 g/dL — ABNORMAL LOW (ref 3.5–5.0)
Alkaline Phosphatase: 90 U/L (ref 38–126)
Anion gap: 11 (ref 5–15)
BUN: 31 mg/dL — ABNORMAL HIGH (ref 8–23)
CO2: 28 mmol/L (ref 22–32)
Calcium: 8.5 mg/dL — ABNORMAL LOW (ref 8.9–10.3)
Chloride: 93 mmol/L — ABNORMAL LOW (ref 98–111)
Creatinine, Ser: 5.46 mg/dL — ABNORMAL HIGH (ref 0.44–1.00)
GFR, Estimated: 8 mL/min — ABNORMAL LOW (ref >60)
Glucose, Bld: 186 mg/dL — ABNORMAL HIGH (ref 70–99)
Potassium: 3.3 mmol/L — ABNORMAL LOW (ref 3.5–5.1)
Sodium: 132 mmol/L — ABNORMAL LOW (ref 135–145)
Total Bilirubin: 0.5 mg/dL (ref 0.0–1.2)
Total Protein: 6.9 g/dL (ref 6.5–8.1)

## 2023-10-13 LAB — CBC
HCT: 29.9 % — ABNORMAL LOW (ref 36.0–46.0)
Hemoglobin: 10.3 g/dL — ABNORMAL LOW (ref 12.0–15.0)
MCH: 30.5 pg (ref 26.0–34.0)
MCHC: 34.4 g/dL (ref 30.0–36.0)
MCV: 88.5 fL (ref 80.0–100.0)
Platelets: 154 10*3/uL (ref 150–400)
RBC: 3.38 MIL/uL — ABNORMAL LOW (ref 3.87–5.11)
RDW: 17.8 % — ABNORMAL HIGH (ref 11.5–15.5)
WBC: 6.8 10*3/uL (ref 4.0–10.5)
nRBC: 0 % (ref 0.0–0.2)

## 2023-10-13 LAB — GLUCOSE, CAPILLARY
Glucose-Capillary: 178 mg/dL — ABNORMAL HIGH (ref 70–99)
Glucose-Capillary: 266 mg/dL — ABNORMAL HIGH (ref 70–99)
Glucose-Capillary: 193 mg/dL — ABNORMAL HIGH (ref 70–99)
Glucose-Capillary: 204 mg/dL — ABNORMAL HIGH (ref 70–99)

## 2023-10-13 LAB — HEPATITIS B SURFACE ANTIGEN: Hepatitis B Surface Ag: NONREACTIVE

## 2023-10-13 MED ORDER — DOXYCYCLINE HYCLATE 100 MG PO TABS
100.0000 mg | ORAL_TABLET | Freq: Two times a day (BID) | ORAL | Status: DC
  Administered 2023-10-13: 100 mg via ORAL
  Filled 2023-10-13: qty 1

## 2023-10-13 MED ORDER — BENZONATATE 100 MG PO CAPS
100.0000 mg | ORAL_CAPSULE | Freq: Three times a day (TID) | ORAL | 0 refills | Status: DC | PRN
  Filled 2023-10-13: qty 30, 10d supply, fill #0

## 2023-10-13 MED ORDER — DOXYCYCLINE HYCLATE 100 MG PO TABS
100.0000 mg | ORAL_TABLET | Freq: Two times a day (BID) | ORAL | 0 refills | Status: AC
  Filled 2023-10-13: qty 6, 3d supply, fill #0

## 2023-10-13 MED ORDER — AMOXICILLIN-POT CLAVULANATE 500-125 MG PO TABS
1.0000 | ORAL_TABLET | Freq: Every day | ORAL | 0 refills | Status: AC
  Filled 2023-10-13: qty 3, 3d supply, fill #0

## 2023-10-13 MED ORDER — ALBUTEROL SULFATE HFA 108 (90 BASE) MCG/ACT IN AERS
1.0000 | INHALATION_SPRAY | Freq: Four times a day (QID) | RESPIRATORY_TRACT | 2 refills | Status: DC | PRN
Start: 2023-10-13 — End: 2023-10-29
  Filled 2023-10-13: qty 6.7, 25d supply, fill #0

## 2023-10-13 MED ORDER — LINAGLIPTIN 5 MG PO TABS
5.0000 mg | ORAL_TABLET | Freq: Every day | ORAL | Status: DC
  Administered 2023-10-13: 5 mg via ORAL
  Filled 2023-10-13: qty 1

## 2023-10-13 NOTE — Progress Notes (Addendum)
 SATURATION QUALIFICATIONS: (This note is used to comply with regulatory documentation for home oxygen)  Patient Saturations on Room Air at Rest = 89%  Patient Saturations on Room Air while Ambulating = 86%  Patient Saturations on 1 Liters of oxygen while Ambulating = 92%  Please briefly explain why patient needs home oxygen:  To maintain oxygen saturation >87% while completing functional activities.    Jerolyn Center, PT Acute Rehabilitation Services  Office [phone]

## 2023-10-13 NOTE — Discharge Planning (Signed)
 [facility] Patient Discharge Orders- [facility] CLINIC: [facility]  Patient's name: Paula [name] Admit/DC Dates: 10/11/2023 - 10/13/2023  Discharge Diagnoses: Acute hypoxic resp failure --more c/w bronchitis than volume overload   Recent Labs  Lab 10/12/23 0425 10/13/23 0511  HGB 8.9* 10.3*  K 3.6 3.3*  CALCIUM 8.6* 8.5*  PHOS 4.4  --   ALBUMIN 3.1* 3.3*   Aranesp: Given: --   Date of last dose/amount: --   PRBC's Given: -- Date/# of units: -- OP ESA: No change   Outpatient Dialysis Orders:  -Heparin: None   -EDW:  72 kg    -Bath: No change    Access intervention/Change: --  IV antibiotics --    Completed by: Tomasa Blase PA-C   D/C Meds to be reconciled by nurse after every discharge.    Reviewed by: MD:______ RN_______

## 2023-10-13 NOTE — Progress Notes (Signed)
 Initial Nutrition Assessment  DOCUMENTATION CODES:   Not applicable  INTERVENTION:  Liberalize diet to carb mod with 1200 ml fluid restriction  Encouraged po intake to meet nutrition needs   NUTRITION DIAGNOSIS:   Increased nutrient needs related to chronic illness as evidenced by estimated needs.  GOAL:   Patient will meet greater than or equal to 90% of their needs  MONITOR:   PO intake, Weight trends, Labs  REASON FOR ASSESSMENT:   Consult Assessment of nutrition requirement/status  ASSESSMENT:   Pt presented with blurred vision and cough for last 5 days and admitted for  acute respiratory failure with hypoxia. Pt with PMH of diastolic CHF, asthma, DM2, ESRD on HD MWF, GERD, HLD, HTN, neuropathy.   3/3: HD with 2.6 L fluid removed  Pt sleeping during visit with pt's daughter at bedside. Daughter provided all nutrition hx. Pt's daughter reports her appetite has been good PTA and during admission. Daughter reports pt normally eats 3 big meals at home and 2.5 meals on dialysis days. She reports pt does not have an appetite after dialysis but gains it back later at night around 10 pm and will eat a bowl of cereal as a late dinner. Daughter had concerns with her mother's diet in the hospital being too restrictive. Discussed reasoning that pt was put on current diet. However, RD will liberalize diet diet to carb mod with 1200 ml fluid restriction. She reports that pt does not like nutritional supplements due to the taste.   Pt's daughter reports pt has not had any weight loss and has a usual estimated dry weight of 72.5 kg. Chart review confirms this is her edw.  Admit weight: 73 kg  Meds: rocephin IV, zofran PRN, phosphlo TID   Labs: CBGs range from 110-209 in last 24 hours, sodium low (132), K low (3.3), chlroide low, BUN 31, Creatinine 5.46  NUTRITION - FOCUSED PHYSICAL EXAM:  Flowsheet Row Most Recent Value  Orbital Region No depletion  Upper Arm Region Mild  depletion  Thoracic and Lumbar Region No depletion  Buccal Region No depletion  Temple Region No depletion  Clavicle Bone Region No depletion  Clavicle and Acromion Bone Region No depletion  Scapular Bone Region No depletion  Dorsal Hand Mild depletion  Patellar Region No depletion  Anterior Thigh Region Mild depletion  Posterior Calf Region Mild depletion  Edema (RD Assessment) None  Hair Reviewed  Eyes Unable to assess  [pt sleeping]  Mouth Reviewed  Skin Reviewed  Nails Unable to assess  [nails painted]       Diet Order:   Diet Order             Diet Carb Modified Fluid consistency: Thin; Room service appropriate? Yes with Assist; Fluid restriction: 1200 mL Fluid  Diet effective now           Diet - low sodium heart healthy           Diet Carb Modified                   EDUCATION NEEDS:   Not appropriate for education at this time  Skin:  Skin Assessment: Reviewed RN Assessment  Last BM:  no value recorded  Height:   Ht Readings from Last 1 Encounters:  09/15/23 5\' 2"  (1.575 m)    Weight:   Wt Readings from Last 1 Encounters:  10/12/23 73 kg    Ideal Body Weight:  50 kg  BMI:  Body mass index  is 29.44 kg/m.  Estimated Nutritional Needs:   Kcal:  1500-1700  Protein:  75-85 g  Fluid:  1 L + UOP    Paula Pro, MS Dietetic Intern

## 2023-10-13 NOTE — Progress Notes (Signed)
 NAME:  Paula [name], MRN:  [id number], DOB:  1946/05/25, LOS: 1 ADMISSION DATE:  10/11/2023, CONSULTATION DATE:  3/3 REFERRING MD:  Paula [name], CHIEF COMPLAINT:  hypoxia   History of Present Illness:  Ms. Paula [name] is a 78 y/o woman who presented for blurry vision. She has recently been having episodes where her blood pressure drops halfway through dialysis and she has had similar episodes of blurry vision.  For the last 1 to 2 weeks she has had increasing cough, similar to when she had pneumonia last month.  Recently she has had with nasal drip and clear phlegm.  Delsym helps her symptoms.  No vomiting other than episodes of her blood pressure drops that HD.  She did not have blood pressure med changes during her last admission.  No pelvic pain or dysuria.  No rashes or hot swollen joints.  No new symptoms otherwise.  She presented with saturations 92% on RA, at one point overnight was on NRB and CPAP. In the ED had saturations of 88%, placed on 3L [place]. PCCM was consulted for hypoxia. ESRD- last HD on Friday.  Previously seen in pulmonology clinic 08/2022 for cough felt to be due to upper airway cough syndrome. Had normal spirometry in 2020, normal PFTs in 2019.    Pertinent  Medical History  DM2 Afib HFpEF HTN GERD ESRD iHD on MWF   Significant Hospital Events: Including procedures, antibiotic start and stop dates in addition to other pertinent events   3/2 presented to the ED with saturations in the 80s.  Interim History / Subjective:  Feels breathing is somewhat better after HD yesterday. -2.1L net. RVP neg.  Objective   Blood pressure (!) 152/83, pulse 74, temperature 98.1 F (36.7 C), temperature source Oral, resp. rate 16, weight 73 kg, SpO2 90%.        Intake/Output Summary (Last 24 hours) at 10/13/2023 0948 Last data filed at 10/13/2023 0303 Gross per 24 hour  Intake 490 ml  Output 2600 ml  Net -2110 ml   Filed Weights   10/12/23 1945  Weight: 73 kg     Examination: General: Elderly woman lying in bed no acute distress HENT: [place]/AT, eyes anicteric, EOMI Lungs: Breathing comfortably on room air currently, CTAB Cardiovascular: RRR, no M/R/G Abdomen: Obese, soft, nontender Extremities: Mild edema Neuro: A&O x 3, no deficits Derm: Warm, dry, no diffuse rashes  CT chest personally reviewed> no PE, but high right sided pressures with reflux of contrast down IVC into liver. Diffuse GG appearance of lungs, but may be influenced by contrasted CT scan. No opacities. No fibrosis.   BNP 828 7.43/43/51/28 Na+ 132 BUN 60 Cr 7.68 PT 0.61 WBC 6.6 H/H 8.9/26.3 Platelets 154   Assessment & Plan:   Acute respiratory failure with hypoxia; although pulmonary symptoms suggest potential infectious pulmonary source, no lobar consolidations were seen on CT scan.  Cannot rule out bronchitis versus sinusitis exacerbating upper airway cough syndrome, although this would not fully explain hypoxia.  Missing dialysis and mild hypervolemia could potentially explain his, although she has not yet missed a dialysis session.  Chronic right heart failure and pulmonary hypertension related to end-stage renal disease is possible. There are no findings to suggest ILD on her CT scan. -Consider echocardiogram given RV findings on CT (though note echo from 2 months ago had normal EF, LVH, normal RVSF). -Continue short course empiric Doxycycline for bronchitis vs sinusitus. Low PCT reassuring. -F/u on sputum culture. -Bronchodilators--Dulera. -Cough suppressants. -Dialysis for volume management.  Chronic upper airway cough syndrome. - Recommend continuing azelastine, Flonase, antihistamine.  Best Practice (right click and "Reselect all SmartList Selections" daily)  Per primary   Rutherford Guys, PA - [facility] For pager details, please see AMION or use Epic chat  After 1900, please call [facility] for cross coverage needs 10/13/2023,  9:56 AM

## 2023-10-13 NOTE — Discharge Summary (Signed)
 PATIENT DETAILS Name: Paula [name] Age: 78 y.o. Sex: female Date of Birth: 1945/10/17 MRN: [id number]. Admitting Physician: Zannie Cove, MD [id number], Odette Horns, MD  Admit Date: 10/11/2023 Discharge date: 10/13/2023  Recommendations for Outpatient Follow-up:  Follow up with PCP in 1-2 weeks PCP to assess if home O2 is still required at next visit. Repeat CBC/chemistries in 1 week  Admitted From:  Home  Disposition: Home health   Discharge Condition: good  CODE STATUS:   Code Status: Full Code   Diet recommendation:  Diet Order             Diet Carb Modified Fluid consistency: Thin; Room service appropriate? Yes with Assist; Fluid restriction: 1200 mL Fluid  Diet effective now           Diet - low sodium heart healthy           Diet Carb Modified                    Brief Summary: 78 year old with history of ESRD on HD, chronic HFpEF, A-fib on Eliquis-presented with cough/shortness of breath-she was found to have acute hypoxic respiratory failure-likely due to bronchitis.  She was subsequently admitted to the hospitalist service-see below for further details.  Significant studies 1/06>> TTE: EF 60-65%, severe LVH, RV systolic function normal. 3/02>> CTA chest: No PE/acute intrathoracic process-posterior bibasilar atelectasis. 3/02>> COVID/influenza/RSV PCR: Negative 3/02 >>respiratory virus panel: Negative  Consults: PCCM Nephrology  Brief Hospital Course: Acute hypoxic respiratory failure Unclear etiology but suspected to be bronchitis-no evidence of volume overload on exam or by imaging studies Managed with supportive care including antibiotics/bronchodilators with significant improvement Titrated to room air today but looks like she does not desaturate to 86 with ambulation-will be provided with home O2 until on discharge. Continue empiric antibiotics for a few more days-continue antitussives and bronchodilators.  ESRD on HD MWF Nephrology  followed closely Resume hemodialysis per previous schedule.  Chronic HFpEF Volume removal with HD Coreg  HTN BP stable Coreg/amlodipine/hydralazine  DM-2 Resume oral hypoglycemics on discharge  PAF Coreg/Eliquis  Nutrition Status: Nutrition Problem: Increased nutrient needs Etiology: chronic illness Signs/Symptoms: estimated needs Interventions: Liberalize Diet    Discharge Diagnoses:  Principal Problem:   Acute respiratory failure with hypoxia (HCC) Active Problems:   Type 2 diabetes mellitus without complication, without long-term current use of insulin (HCC)   Essential hypertension   Paroxysmal A-fib (HCC)   Hyperlipidemia   Chronic diastolic CHF (congestive heart failure) (HCC)   ILD (interstitial lung disease) (HCC)   ESRD on dialysis (HCC)   Acute hypoxic respiratory failure (HCC)   Blurry vision   Anemia due to chronic kidney disease   CHF (congestive heart failure) (HCC)   Discharge Instructions:  Activity:  As tolerated with Full fall precautions use walker/cane & assistance as needed Discharge Instructions     Call MD for:  difficulty breathing, headache or visual disturbances   Complete by: As directed    Diet - low sodium heart healthy   Complete by: As directed    Diet Carb Modified   Complete by: As directed    Increase activity slowly   Complete by: As directed       Allergies as of 10/13/2023       Reactions   Dust Mite Extract Cough   Peanut-containing Drug Products Cough   Ace Inhibitors Cough   Insulins    Reports fainting, intolerance   Lovastatin Other (See Comments)   Generalized body  pain        Medication List     TAKE these medications    Accu-Chek Guide test strip Generic drug: glucose blood USE AS INSTRUCTED 2 TIMES DAILY   acetaminophen 500 MG tablet Commonly known as: TYLENOL Take 500 mg by mouth every 6 (six) hours as needed for fever, moderate pain or mild pain.   albuterol (2.5 MG/3ML) 0.083% nebulizer  solution Commonly known as: PROVENTIL Take 2.5 mg by nebulization every 6 (six) hours as needed for wheezing or shortness of breath.   albuterol 108 (90 Base) MCG/ACT inhaler Commonly known as: ProAir HFA Inhale 1-2 puffs into the lungs every 6 (six) hours as needed for wheezing or shortness of breath.   amLODipine 5 MG tablet Commonly known as: NORVASC Take 1 tablet (5 mg total) by mouth daily.   amoxicillin-clavulanate 500-125 MG tablet Commonly known as: Augmentin Take 1 tablet by mouth daily for 3 days. On hemodialysis days-take after hemodialysis. Start taking on: October 14, 2023   atorvastatin 80 MG tablet Commonly known as: LIPITOR Take 1 tablet (80 mg total) by mouth daily.   Azelastine HCl 137 MCG/SPRAY Soln Place 2 sprays into both nostrils 2 (two) times daily. Use in each nostril as directed What changed: when to take this   benzonatate 100 MG capsule Commonly known as: Tessalon Perles Take 1 capsule (100 mg total) by mouth 3 (three) times daily as needed for cough.   calcium acetate 667 MG capsule Commonly known as: PHOSLO Take 1 capsule (667 mg total) by mouth 3 (three) times daily with meals.   carvedilol 12.5 MG tablet Commonly known as: COREG Take 1 tablet (12.5 mg total) by mouth 2 (two) times daily with a meal. (Take once a day in the evening on hemodialysis days)   doxycycline 100 MG tablet Commonly known as: VIBRA-TABS Take 1 tablet (100 mg total) by mouth 2 (two) times daily for 3 days.   EARACHE DROPS OT Place 1 drop into both ears daily as needed (for ear aches).   Eliquis 5 MG Tabs tablet Generic drug: apixaban Take 1 tablet (5 mg total) by mouth 2 (two) times daily.   fluticasone 50 MCG/ACT nasal spray Commonly known as: FLONASE Place 2 sprays into both nostrils daily.   fluticasone-salmeterol 100-50 MCG/ACT Aepb Commonly known as: ADVAIR Inhale 1 puff into the lungs 2 (two) times daily.   gabapentin 100 MG capsule Commonly known as:  NEURONTIN Take 1 capsule (100 mg total) by mouth 3 (three) times daily.   glipiZIDE 2.5 MG 24 hr tablet Commonly known as: GLUCOTROL XL Take 1 tablet (2.5 mg total) by mouth daily with breakfast. (needs office visit) What changed:  when to take this reasons to take this   HALLS COUGH DROPS MT Use as directed 1 lozenge in the mouth or throat every 2 (two) hours as needed (for coughing).   isosorbide mononitrate 60 MG 24 hr tablet Commonly known as: IMDUR Take 1 tablet (60 mg total) by mouth daily.   lidocaine-prilocaine cream Commonly known as: EMLA Apply a small amount to dialysis access one hour prior to dialysis. Wrap with plastic wrap.   loratadine 10 MG tablet Commonly known as: CLARITIN Take 1 tablet (10 mg total) by mouth daily.   midodrine 10 MG tablet Commonly known as: PROAMATINE Take 1 tablet (10 mg total) by mouth 3 (three) times a week. Take 30 minutes prior to HD What changed:  when to take this additional instructions   Misc.  Devices [place] [employer].  Diagnosis - unstable gait What changed: Another medication with the same name was changed. Make sure you understand how and when to take each.   Misc. Devices Misc Fistula sleeve for R arm.  Diagnoses: End-stage renal disease on hemodialysis What changed: Another medication with the same name was changed. Make sure you understand how and when to take each.   Misc. Devices Misc 1. Incontinence supplies.  2. Briefs.  Diagnosis-urinary incontinence What changed: Another medication with the same name was changed. Make sure you understand how and when to take each.   Misc. Devices Misc Nepro dialysis protein drink.  Diagnosis end-stage renal disease What changed:  how much to take how to take this when to take this additional instructions   Misc. Devices Misc Blood pressure monitor.  Diagnosis hypertension What changed: Another medication with the same name was changed. Make sure you understand how and when to  take each.   montelukast 10 MG tablet Commonly known as: SINGULAIR Take 1 tablet (10 mg total) by mouth at bedtime.   olopatadine 0.1 % ophthalmic solution Commonly known as: PATANOL Place 1 drop into both eyes 2 (two) times daily. What changed: when to take this   ondansetron 4 MG tablet Commonly known as: ZOFRAN Take 4 mg by mouth every 8 (eight) hours as needed for nausea or vomiting.   Tradjenta 5 MG Tabs tablet Generic drug: linagliptin Take 1 tablet (5 mg total) by mouth daily.               Durable Medical Equipment  (From admission, onward)           Start     Ordered   10/13/23 1206  For home use only DME oxygen  Once       Question Answer Comment  Length of Need 6 Months   Mode or (Route) Nasal cannula   Liters per Minute 2   Oxygen delivery system Gas      10/13/23 1205            Follow-up Information     Hoy Register, MD. Schedule an appointment as soon as possible for a visit in 1 week(s).   Specialty: Family Medicine Contact information: [address] [place] [address] [phone]                Allergies  Allergen Reactions   Dust Mite Extract Cough   Peanut-Containing Drug Products Cough   Ace Inhibitors Cough   Insulins     Reports fainting, intolerance   Lovastatin Other (See Comments)    Generalized body pain     Other Procedures/Studies: DG CHEST PORT 1 VIEW Result Date: 10/11/2023 CLINICAL DATA:  Encounter for hypoxia EXAM: PORTABLE CHEST 1 VIEW COMPARISON:  Same day radiographs and CT FINDINGS: Stable cardiomegaly. Aortic atherosclerotic calcification. Low lung volumes accentuate pulmonary vascularity. Bibasilar atelectasis or infiltrates. No pleural effusion or pneumothorax. No displaced rib fractures. IMPRESSION: Cardiomegaly. Low lung volumes with bibasilar atelectasis or infiltrates. Electronically Signed   By: Minerva Fester M.D.   On: 10/11/2023 23:55   CT Angio Chest PE W and/or Wo  Contrast Result Date: 10/11/2023 CLINICAL DATA:  Cough and blurred vision. EXAM: CT ANGIOGRAPHY CHEST WITH CONTRAST TECHNIQUE: Multidetector CT imaging of the chest was performed using the standard protocol during bolus administration of intravenous contrast. Multiplanar CT image reconstructions and MIPs were obtained to evaluate the vascular anatomy. RADIATION DOSE REDUCTION: This exam was performed according  to the departmental dose-optimization program which includes automated exposure control, adjustment of the mA and/or kV according to patient size and/or use of iterative reconstruction technique. CONTRAST:  75mL OMNIPAQUE IOHEXOL 350 MG/ML SOLN COMPARISON:  May 24, 2021 FINDINGS: Cardiovascular: There is marked severity calcification of the thoracic aorta. Satisfactory opacification of the pulmonary arteries to the segmental level. No evidence of pulmonary embolism. Normal heart size. No pericardial effusion. Mediastinum/Nodes: No enlarged mediastinal, hilar, or axillary lymph nodes. Thyroid gland, trachea, and esophagus demonstrate no significant findings. Lungs/Pleura: Mild lingular and posterior bibasilar atelectasis is seen. There is no evidence of an acute infiltrate, pleural effusion or pneumothorax. Upper Abdomen: No acute abnormality. Musculoskeletal: No chest wall abnormality. No acute or significant osseous findings. Review of the MIP images confirms the above findings. IMPRESSION: 1. No evidence of pulmonary embolism or other acute intrathoracic process. 2. Mild lingular and posterior bibasilar atelectasis. 3. Aortic atherosclerosis. Aortic Atherosclerosis (ICD10-I70.0). Electronically Signed   By: Aram Candela M.D.   On: 10/11/2023 21:11   DG Chest 2 View Result Date: 10/11/2023 CLINICAL DATA:  Cough EXAM: CHEST - 2 VIEW COMPARISON:  08/16/2023 FINDINGS: Mild cardiomegaly. Both lungs are clear. The visualized skeletal structures are unremarkable. IMPRESSION: Mild cardiomegaly.  No acute  abnormality of the lungs. Electronically Signed   By: Jearld Lesch M.D.   On: 10/11/2023 15:28     TODAY-DAY OF DISCHARGE:  Subjective:   Landis Martins today has no headache,no chest abdominal pain,no new weakness tingling or numbness, feels much better wants to go home today.   Objective:   Blood pressure (!) 152/83, pulse 74, temperature 98.1 F (36.7 C), temperature source Oral, resp. rate 16, weight 73 kg, SpO2 90%.  Intake/Output Summary (Last 24 hours) at 10/13/2023 1219 Last data filed at 10/13/2023 0303 Gross per 24 hour  Intake 490 ml  Output 2600 ml  Net -2110 ml   Filed Weights   10/12/23 1945  Weight: 73 kg    Exam: Awake Alert, Oriented *3, No new F.N deficits, Normal affect .AT,PERRAL Supple Neck,No JVD, No cervical lymphadenopathy appriciated.  Symmetrical Chest wall movement, Good air movement bilaterally, CTAB RRR,No Gallops,Rubs or new Murmurs, No Parasternal Heave +ve B.Sounds, Abd Soft, Non tender, No organomegaly appriciated, No rebound -guarding or rigidity. No Cyanosis, Clubbing or edema, No new Rash or bruise   PERTINENT RADIOLOGIC STUDIES: DG CHEST PORT 1 VIEW Result Date: 10/11/2023 CLINICAL DATA:  Encounter for hypoxia EXAM: PORTABLE CHEST 1 VIEW COMPARISON:  Same day radiographs and CT FINDINGS: Stable cardiomegaly. Aortic atherosclerotic calcification. Low lung volumes accentuate pulmonary vascularity. Bibasilar atelectasis or infiltrates. No pleural effusion or pneumothorax. No displaced rib fractures. IMPRESSION: Cardiomegaly. Low lung volumes with bibasilar atelectasis or infiltrates. Electronically Signed   By: Minerva Fester M.D.   On: 10/11/2023 23:55   CT Angio Chest PE W and/or Wo Contrast Result Date: 10/11/2023 CLINICAL DATA:  Cough and blurred vision. EXAM: CT ANGIOGRAPHY CHEST WITH CONTRAST TECHNIQUE: Multidetector CT imaging of the chest was performed using the standard protocol during bolus administration of intravenous contrast.  Multiplanar CT image reconstructions and MIPs were obtained to evaluate the vascular anatomy. RADIATION DOSE REDUCTION: This exam was performed according to the departmental dose-optimization program which includes automated exposure control, adjustment of the mA and/or kV according to patient size and/or use of iterative reconstruction technique. CONTRAST:  75mL OMNIPAQUE IOHEXOL 350 MG/ML SOLN COMPARISON:  May 24, 2021 FINDINGS: Cardiovascular: There is marked severity calcification of the thoracic aorta. Satisfactory opacification of  the pulmonary arteries to the segmental level. No evidence of pulmonary embolism. Normal heart size. No pericardial effusion. Mediastinum/Nodes: No enlarged mediastinal, hilar, or axillary lymph nodes. Thyroid gland, trachea, and esophagus demonstrate no significant findings. Lungs/Pleura: Mild lingular and posterior bibasilar atelectasis is seen. There is no evidence of an acute infiltrate, pleural effusion or pneumothorax. Upper Abdomen: No acute abnormality. Musculoskeletal: No chest wall abnormality. No acute or significant osseous findings. Review of the MIP images confirms the above findings. IMPRESSION: 1. No evidence of pulmonary embolism or other acute intrathoracic process. 2. Mild lingular and posterior bibasilar atelectasis. 3. Aortic atherosclerosis. Aortic Atherosclerosis (ICD10-I70.0). Electronically Signed   By: Aram Candela M.D.   On: 10/11/2023 21:11   DG Chest 2 View Result Date: 10/11/2023 CLINICAL DATA:  Cough EXAM: CHEST - 2 VIEW COMPARISON:  08/16/2023 FINDINGS: Mild cardiomegaly. Both lungs are clear. The visualized skeletal structures are unremarkable. IMPRESSION: Mild cardiomegaly.  No acute abnormality of the lungs. Electronically Signed   By: Jearld Lesch M.D.   On: 10/11/2023 15:28     PERTINENT LAB RESULTS: CBC: Recent Labs    10/12/23 0425 10/13/23 0511  WBC 6.6 6.8  HGB 8.9* 10.3*  HCT 26.3* 29.9*  PLT 154 154   CMET CMP      Component Value Date/Time   NA 132 (L) 10/13/2023 0511   NA 133 (L) 11/01/2021 1147   K 3.3 (L) 10/13/2023 0511   CL 93 (L) 10/13/2023 0511   CO2 28 10/13/2023 0511   GLUCOSE 186 (H) 10/13/2023 0511   BUN 31 (H) 10/13/2023 0511   BUN 101 (HH) 11/01/2021 1147   CREATININE 5.46 (H) 10/13/2023 0511   CREATININE 1.52 (H) 06/27/2016 1152   CALCIUM 8.5 (L) 10/13/2023 0511   PROT 6.9 10/13/2023 0511   PROT 6.2 11/01/2021 1147   ALBUMIN 3.3 (L) 10/13/2023 0511   ALBUMIN 3.6 (L) 11/01/2021 1147   AST 19 10/13/2023 0511   ALT 17 10/13/2023 0511   ALKPHOS 90 10/13/2023 0511   BILITOT 0.5 10/13/2023 0511   BILITOT 0.3 11/01/2021 1147   EGFR 7 (L) 11/01/2021 1147   GFRNONAA 8 (L) 10/13/2023 0511   GFRNONAA 35 (L) 06/27/2016 1152    GFR Estimated Creatinine Clearance: 8.1 mL/min (A) (by C-G formula based on SCr of 5.46 mg/dL (H)). No results for input(s): "LIPASE", "AMYLASE" in the last 72 hours. No results for input(s): "CKTOTAL", "CKMB", "CKMBINDEX", "TROPONINI" in the last 72 hours. Invalid input(s): "POCBNP" No results for input(s): "DDIMER" in the last 72 hours. No results for input(s): "HGBA1C" in the last 72 hours. No results for input(s): "CHOL", "HDL", "LDLCALC", "TRIG", "CHOLHDL", "LDLDIRECT" in the last 72 hours. No results for input(s): "TSH", "T4TOTAL", "T3FREE", "THYROIDAB" in the last 72 hours.  Invalid input(s): "FREET3" Recent Labs    10/12/23 0425  VITAMINB12 1,163*  FOLATE 8.5   Coags: No results for input(s): "INR" in the last 72 hours.  Invalid input(s): "PT" Microbiology: Recent Results (from the past 240 hours)  Resp panel by RT-PCR (RSV, Flu A&B, Covid) Anterior Nasal Swab     Status: None   Collection Time: 10/11/23  4:22 PM   Specimen: Anterior Nasal Swab  Result Value Ref Range Status   SARS Coronavirus 2 by RT PCR NEGATIVE NEGATIVE Final   Influenza A by PCR NEGATIVE NEGATIVE Final   Influenza B by PCR NEGATIVE NEGATIVE Final    Comment:  (NOTE) The Xpert Xpress SARS-CoV-2/FLU/RSV plus assay is intended as an aid in the  diagnosis of influenza from Nasopharyngeal swab specimens and should not be used as a sole basis for treatment. Nasal washings and aspirates are unacceptable for Xpert Xpress SARS-CoV-2/FLU/RSV testing.  Fact Sheet for Patients: BloggerCourse.com  Fact Sheet for Healthcare Providers: SeriousBroker.it  This test is not yet approved or cleared by the [place] FDA and has been authorized for detection and/or diagnosis of SARS-CoV-2 by FDA under an Emergency Use Authorization (EUA). This EUA will remain in effect (meaning this test can be used) for the duration of the COVID-19 declaration under Section 564(b)(1) of the Act, 21 U.S.C. section 360bbb-3(b)(1), unless the authorization is terminated or revoked.     Resp Syncytial Virus by PCR NEGATIVE NEGATIVE Final    Comment: (NOTE) Fact Sheet for Patients: BloggerCourse.com  Fact Sheet for Healthcare Providers: SeriousBroker.it  This test is not yet approved or cleared by the [place] FDA and has been authorized for detection and/or diagnosis of SARS-CoV-2 by FDA under an Emergency Use Authorization (EUA). This EUA will remain in effect (meaning this test can be used) for the duration of the COVID-19 declaration under Section 564(b)(1) of the Act, 21 U.S.C. section 360bbb-3(b)(1), unless the authorization is terminated or revoked.  Performed at [facility], [address] [place], [place] [address]   Respiratory (~20 pathogens) panel by PCR     Status: None   Collection Time: 10/11/23  8:44 PM   Specimen: Nasopharyngeal Swab; Respiratory  Result Value Ref Range Status   Adenovirus NOT DETECTED NOT DETECTED Final   Coronavirus 229E NOT DETECTED NOT DETECTED Final    Comment: (NOTE) The Coronavirus on the Respiratory Panel, DOES  NOT test for the novel  Coronavirus (2019 nCoV)    Coronavirus HKU1 NOT DETECTED NOT DETECTED Final   Coronavirus NL63 NOT DETECTED NOT DETECTED Final   Coronavirus OC43 NOT DETECTED NOT DETECTED Final   Metapneumovirus NOT DETECTED NOT DETECTED Final   Rhinovirus / Enterovirus NOT DETECTED NOT DETECTED Final   Influenza A NOT DETECTED NOT DETECTED Final   Influenza B NOT DETECTED NOT DETECTED Final   Parainfluenza Virus 1 NOT DETECTED NOT DETECTED Final   Parainfluenza Virus 2 NOT DETECTED NOT DETECTED Final   Parainfluenza Virus 3 NOT DETECTED NOT DETECTED Final   Parainfluenza Virus 4 NOT DETECTED NOT DETECTED Final   Respiratory Syncytial Virus NOT DETECTED NOT DETECTED Final   Bordetella pertussis NOT DETECTED NOT DETECTED Final   Bordetella Parapertussis NOT DETECTED NOT DETECTED Final   Chlamydophila pneumoniae NOT DETECTED NOT DETECTED Final   Mycoplasma pneumoniae NOT DETECTED NOT DETECTED Final    Comment: Performed at [facility], [address] [place], [place] [address]  Expectorated Sputum Assessment w Gram Stain, Rflx to Resp Cult     Status: None   Collection Time: 10/12/23 10:39 AM   Specimen: Expectorated Sputum  Result Value Ref Range Status   Specimen Description EXPECTORATED SPUTUM  Final   Special Requests NONE  Final   Sputum evaluation   Final    Sputum specimen not acceptable for testing.  Please recollect.   NOTIFIED RN RANJIAT ON 10/12/23 @ 1311 BY DRT Performed at [facility], [address], [place], [place] [address]    Report Status 10/12/2023 FINAL  Final    FURTHER DISCHARGE INSTRUCTIONS:  Get Medicines reviewed and adjusted: Please take all your medications with you for your next visit with your Primary MD  Laboratory/radiological data: Please request your Primary MD to go  over all hospital tests and procedure/radiological results at the follow up, please ask your Primary MD to get all Hospital records sent to his/her  office.  In some cases, they will be blood work, cultures and biopsy results pending at the time of your discharge. Please request that your primary care M.D. goes through all the records of your hospital data and follows up on these results.  Also Note the following: If you experience worsening of your admission symptoms, develop shortness of breath, life threatening emergency, suicidal or homicidal thoughts you must seek medical attention immediately by calling 911 or calling your MD immediately  if symptoms less severe.  You must read complete instructions/literature along with all the possible adverse reactions/side effects for all the Medicines you take and that have been prescribed to you. Take any new Medicines after you have completely understood and accpet all the possible adverse reactions/side effects.   Do not drive when taking Pain medications or sleeping medications (Benzodaizepines)  Do not take more than prescribed Pain, Sleep and Anxiety Medications. It is not advisable to combine anxiety,sleep and pain medications without talking with your primary care practitioner  Special Instructions: If you have smoked or chewed Tobacco  in the last 2 yrs please stop smoking, stop any regular Alcohol  and or any Recreational drug use.  Wear Seat belts while driving.  Please note: You were cared for by a hospitalist during your hospital stay. Once you are discharged, your primary care physician will handle any further medical issues. Please note that NO REFILLS for any discharge medications will be authorized once you are discharged, as it is imperative that you return to your primary care physician (or establish a relationship with a primary care physician if you do not have one) for your post hospital discharge needs so that they can reassess your need for medications and monitor your lab values.  Total Time spent coordinating discharge including counseling, education and face to face time  equals greater than 30 minutes.  SignedJeoffrey Massed 10/13/2023 12:19 PM

## 2023-10-13 NOTE — Progress Notes (Signed)
 DISCHARGE NOTE HOME Paula [name] to be discharged Home per MD order. Discussed prescriptions and follow up appointments with the patient. Prescriptions given to patient; medication list explained in detail. Patient verbalized understanding.  Skin clean, dry and intact without evidence of skin break down, no evidence of skin tears noted. IV catheter discontinued intact. Site without signs and symptoms of complications. Dressing and pressure applied. Pt denies pain at the site currently. No complaints noted.  Patient free of lines, drains, and wounds otherthan noted on LDA  An After Visit Summary (AVS) was printed and given to the patient. Questioned if any additional questions  O2 delivered to room  Medication picked up on the way out. Patient escorted via wheelchair, and discharged home via private auto.  Velia Meyer, RN

## 2023-10-13 NOTE — Progress Notes (Signed)
D/C order noted. Contacted [facility] to be advised of pt's d/c today and that pt should resume care tomorrow.   Olivia Canter Renal Navigator [phone]

## 2023-10-13 NOTE — TOC Transition Note (Addendum)
 Transition of Care [facility]) - Discharge Note   Patient Details  Name: Paula [name] MRN: [id number] Date of Birth: 07/01/1946  Transition of Care [facility]) CM/SW Contact:  Tom-Johnson, Hershal Coria, RN Phone Number: 10/13/2023, 12:31 PM   Clinical Narrative:     Patient is scheduled for discharge today.  Readmission Risk Assessment done. Home health info, Outpatient f/u, hospital f/u and discharge instructions on AVS. Home O2 ordered from [employer] and Zollie Beckers to deliver portable tank to patient at bedside.  Prescriptions sent to [facility] pharmacy and patient will receive meds prior discharge. Daughter, Glynda Jaeger to transport at discharge.  No further TOC needs noted.  13:05- CM informed by Zollie Beckers with [facility] that they are not in network with patient's insurance. Order called in to [employer] and Earna Coder to deliver to patient.   No further TOC needs noted.          Final next level of care: Home w Home Health Services Barriers to Discharge: Barriers Resolved   Patient Goals and CMS Choice Patient states their goals for this hospitalization and ongoing recovery are:: To return home CMS Medicare.gov Compare Post Acute Care list provided to:: Patient Choice offered to / list presented to : Patient, Adult Children (Daughter, [place])      Discharge Placement                Patient to be transferred to facility by: Daughter Name of family member notified: [facility]    Discharge Plan and Services Additional resources added to the After Visit Summary for                  DME Arranged: Oxygen DME Agency: [facility] Date DME Agency Contacted: 10/13/23 Time DME Agency Contacted: 1220 Representative spoke with at DME Agency: Zollie Beckers HH Arranged: PT, OT, RN, Disease Management, Social Work [employer] Agency: [facility] Date [facility] Agency Contacted: 10/13/23 Time HH Agency Contacted: 1218 Representative spoke with at [facility] Agency: Amy  Social Drivers of Health (SDOH) Interventions SDOH  Screenings   Food Insecurity: Food Insecurity Present (10/11/2023)  Housing: High Risk (10/11/2023)  Transportation Needs: Unmet Transportation Needs (10/11/2023)  Utilities: At Risk (10/11/2023)  Alcohol Screen: Low Risk  (09/15/2023)  Depression (PHQ2-9): Low Risk  (09/15/2023)  Financial Resource Strain: High Risk (09/15/2023)  Physical Activity: Insufficiently Active (09/15/2023)  Social Connections: Moderately Integrated (10/11/2023)  Recent Concern: Social Connections - Moderately Isolated (08/17/2023)  Stress: No Stress Concern Present (09/15/2023)  Tobacco Use: Low Risk  (10/11/2023)  Health Literacy: Adequate Health Literacy (09/15/2023)     Readmission Risk Interventions    10/13/2023   12:25 PM 08/18/2023   12:18 PM 09/05/2021   12:31 PM  Readmission Risk Prevention Plan  Transportation Screening Complete Complete Complete  PCP or Specialist Appt within 5-7 Days   Complete  Home Care Screening   Complete  Medication Review (RN CM)   Complete  Medication Review (RN Care Manager) Referral to Pharmacy Complete   PCP or Specialist appointment within 3-5 days of discharge Complete Complete   HRI or Home Care Consult Complete Complete   SW Recovery Care/Counseling Consult Complete    Palliative Care Screening Not Applicable Not Applicable   Skilled Nursing Facility Not Applicable Not Applicable

## 2023-10-13 NOTE — Progress Notes (Signed)
   10/13/23 1213  Mobility  Activity Refused mobility  Mobility Specialist Start Time (ACUTE ONLY) 1213   Mobility Specialist: Progress Note  Pt refused mobility session d/t "waiting on diabetes medication and working with PT this morning. - Received and left in bed with all needs met. Call bell within reach. MS will follow up as time permits.   Barnie Mort, BS Mobility Specialist Please contact via SecureChat or Rehab office at [phone].

## 2023-10-13 NOTE — Evaluation (Addendum)
 Clinical/Bedside Swallow Evaluation Patient Details  Name: Paula [name] MRN: [id number] Date of Birth: July 10, 1946  Today's Date: 10/13/2023 Time: SLP Start Time (ACUTE ONLY): 1300 SLP Stop Time (ACUTE ONLY): 1315 SLP Time Calculation (min) (ACUTE ONLY): 15 min  Past Medical History:  Past Medical History:  Diagnosis Date   Allergic rhinitis    Allergy 1/12000   Arthritis    Asthma    Brain aneurysm    Cough    Diabetes mellitus    Diastolic CHF, chronic (HCC) 10/11/2011   ESRD on hemodialysis (HCC)    MWF   GERD (gastroesophageal reflux disease)    History of colon polyps 2012   tubular adenoma    Hyperlipidemia    Hypertension    Neuropathy 10/11/2011   PONV (postoperative nausea and vomiting)    one time after lymph node surgery   Past Surgical History:  Past Surgical History:  Procedure Laterality Date   AV FISTULA PLACEMENT Right 10/15/2021   Procedure: ARTERIOVENOUS (AV) FISTULA  WITH PLACEMENT OF GORE-TEX STRETCH  GRAFT (4-14mmx45cm);  Surgeon: Chuck Hint, MD;  Location: [facility] OR;  Service: Vascular;  Laterality: Right;   CATARACT EXTRACTION     right eye   IR 3D INDEPENDENT WKST  12/14/2017   IR ANGIO INTRA EXTRACRAN SEL COM CAROTID INNOMINATE BILAT MOD SED  09/15/2017   IR ANGIO INTRA EXTRACRAN SEL COM CAROTID INNOMINATE UNI L MOD SED  12/14/2017   IR ANGIO VERTEBRAL SEL VERTEBRAL BILAT MOD SED  09/15/2017   IR RADIOLOGIST EVAL & MGMT  09/10/2017   IR RADIOLOGIST EVAL & MGMT  10/19/2017   lymphatic mass surgery     NASAL TURBINATE REDUCTION     RADIOLOGY WITH ANESTHESIA N/A 12/14/2017   Procedure: RADIOLOGY WITH ANESTHESIA EMBOLIZATION;  Surgeon: Julieanne Cotton, MD;  Location: [facility] OR;  Service: Radiology;  Laterality: N/A;   VIDEO BRONCHOSCOPY Bilateral 11/18/2017   Procedure: VIDEO BRONCHOSCOPY WITHOUT FLUORO;  Surgeon: Leslye Peer, MD;  Location: [facility];  Service: Cardiopulmonary;  Laterality: Bilateral;   HPI:  Patient is a 78 y.o.  female with PMH: GERD, HLD, HTN, neuropathy, asthma, DM-2, ESRD on HD MWF, diastolic CHF. She presented to the hospital on 10/11/23 with c/o blurred vision and cough x5 days. She was admitted with acute respiratory failure with hypoxia.    Assessment / Plan / Recommendation  Clinical Impression  Patient is presenting with clinical and subjective s/s of what appears to be a primary esophageal based dysphagia. Patient is a very good historian and told SLP that she does have h/o GERD but was taken off her medications for this at least a year ago. She indicated that one of the reasons for stopping GERD meds was her dialysis and patient adding that she was already on nine different medications. She exhibited timely swallow initiation with sips of water and no overt s/s aspiration but she did exhibit delayed cough which persisted for a couple minutes. Patient called her daughter and SLP spoke to her on the phone regarding his suspicions that her cough may be at least partly due to GERD. Patient did endorse sensation of retrograde movement of barium in esophagus/feeling of regurgitation. SLP recommending patient consider f/u with PCP if her GERD symptoms and/or cough frequency don't improve or get worse. SLP Visit Diagnosis: Dysphagia, unspecified (R13.10)    Aspiration Risk  Mild aspiration risk    Diet Recommendation Regular;Thin liquid    Liquid Administration via: Cup;Straw Medication Administration: Whole meds with liquid  Supervision: Patient able to self feed Compensations: Slow rate;Small sips/bites Postural Changes: Remain upright for at least 30 minutes after po intake;Seated upright at 90 degrees    Other  Recommendations Recommended Consults: Consider GI evaluation    Recommendations for follow up therapy are one component of a multi-disciplinary discharge planning process, led by the attending physician.  Recommendations may be updated based on patient status, additional functional criteria  and insurance authorization.  Follow up Recommendations No SLP follow up      Assistance Recommended at Discharge    Functional Status Assessment Patient has not had a recent decline in their functional status  Frequency and Duration     N/A       Prognosis   N/A     Swallow Study   General Date of Onset: 10/12/23 HPI: Patient is a 78 y.o. female with PMH: GERD, HLD, HTN, neuropathy, asthma, DM-2, ESRD on HD MWF, diastolic CHF. She presented to the hospital on 10/11/23 with c/o blurred vision and cough x5 days. She was admitted with acute respiratory failure with hypoxia. Type of Study: Bedside Swallow Evaluation Previous Swallow Assessment: none found Diet Prior to this Study: Regular;Thin liquids (Level 0) Temperature Spikes Noted: No Respiratory Status: Room air History of Recent Intubation: No Behavior/Cognition: Alert;Cooperative;Pleasant mood Oral Cavity Assessment: Within Functional Limits Oral Care Completed by SLP: No Vision: Functional for self-feeding Self-Feeding Abilities: Able to feed self Patient Positioning: Upright in bed Baseline Vocal Quality: Hoarse Volitional Cough: Strong Volitional Swallow: Able to elicit    Oral/Motor/Sensory Function Overall Oral Motor/Sensory Function: Within functional limits   Ice Chips     Thin Liquid Thin Liquid: Impaired Presentation: Straw;Self Fed Pharyngeal  Phase Impairments: Cough - Delayed Other Comments: delayed coughing suspected to be from GERD    Nectar Thick Nectar Thick Liquid: Not tested   Honey Thick Honey Thick Liquid: Not tested   Puree Puree: Not tested   Solid     Solid: Not tested     Angela Nevin, MA, CCC-SLP Speech Therapy

## 2023-10-13 NOTE — Inpatient Diabetes Management (Signed)
 Inpatient Diabetes Program Recommendations  AACE/ADA: New Consensus Statement on Inpatient Glycemic Control (2015)  Target Ranges:  Prepandial:   less than 140 mg/dL      Peak postprandial:   less than 180 mg/dL (1-2 hours)      Critically ill patients:  140 - 180 mg/dL   Lab Results  Component Value Date   GLUCAP 266 (H) 10/13/2023   HGBA1C 7.4 (H) 08/17/2023    Review of Glycemic Control  Latest Reference Range & Units 10/12/23 08:26 10/12/23 12:26 10/12/23 23:49 10/13/23 02:16 10/13/23 09:23  Glucose-Capillary 70 - 99 mg/dL 161 (H) 096 (H) 045 (H) 178 (H) 266 (H)   Diabetes history: DM 2 Outpatient Diabetes medications: Glipizide 2.5 mg prn Daily, Tradjenta 5 mg Daily Current orders for Inpatient glycemic control:  None  Inpatient Diabetes Program Recommendations:    Glucose trends 266 this am.  -   Consider starting Novolog 0-6 units tid + hs scale while here.  Thanks, Christena Deem RN, MSN, BC-ADM Inpatient Diabetes Coordinator Team Pager [phone] (8a-5p)

## 2023-10-13 NOTE — Progress Notes (Signed)
 [facility] Progress Note   Subjective: Seen and examined in room. Daughter visiting. Did well with HD yesterday. Net UF 2.6. Overall breathing is better.   Objective Vitals:   10/13/23 0215 10/13/23 0222 10/13/23 0917 10/13/23 0923  BP: (!) 152/74   (!) 152/83  Pulse: 76 75 74   Resp:  20 16   Temp:  98.1 F (36.7 C) 98.1 F (36.7 C) 98.1 F (36.7 C)  TempSrc:  Oral Oral Oral  SpO2: 93% 92% 92% 90%  Weight:        Additional Objective Labs: Basic Metabolic Panel: Recent Labs  Lab 10/11/23 1530 10/11/23 2356 10/12/23 0425 10/13/23 0511  NA 133* 132* 132* 132*  K 3.5 3.6 3.6 3.3*  CL 93*  --  92* 93*  CO2 29  --  28 28  GLUCOSE 191*  --  184* 186*  BUN 54*  --  60* 31*  CREATININE 6.94*  --  7.68* 5.46*  CALCIUM 9.1  --  8.6* 8.5*  PHOS  --   --  4.4  --    CBC: Recent Labs  Lab 10/11/23 1530 10/11/23 2356 10/12/23 0425 10/13/23 0511  WBC 7.1  --  6.6 6.8  NEUTROABS 4.8  --   --   --   HGB 10.3* 9.5* 8.9* 10.3*  HCT 30.8* 28.0* 26.3* 29.9*  MCV 89.0  --  89.5 88.5  PLT 183  --  154 154   Blood Culture    Component Value Date/Time   SDES EXPECTORATED SPUTUM 10/12/2023 1039   SPECREQUEST NONE 10/12/2023 1039   CULT (A) 11/18/2017 1040    50,000 COLONIES/mL Consistent with normal respiratory flora. Performed at [facility], [address], [place], [place] [address]    REPTSTATUS 10/12/2023 FINAL 10/12/2023 1039     Physical Exam General: Alert, nad  Heart: RRR Lungs: Occasional rhonchi, normal wob  Abdomen: soft non -tender Extremities: No LE edema Dialysis Access: AVF +bruit   Medications:  cefTRIAXone (ROCEPHIN)  IV 2 g (10/13/23 1018)   doxycycline (VIBRAMYCIN) IV Stopped (10/13/23 0217)    amLODipine  5 mg Oral QPM   apixaban  5 mg Oral BID   atorvastatin  80 mg Oral Daily   azelastine  2 spray Each Nare BID   calcium acetate  667 mg Oral TID WC   carvedilol  12.5 mg Oral BID   Chlorhexidine Gluconate Cloth   6 each Topical Q0600   fluticasone  2 spray Each Nare Daily   guaiFENesin  600 mg Oral BID   hydrALAZINE  10 mg Oral TID   loratadine  10 mg Oral Daily   mometasone-formoterol  2 puff Inhalation BID   olopatadine  1 drop Both Eyes BID   sodium chloride flush  3 mL Intravenous Q12H    Dialysis Orders:  SW MWF 3h  B400  72.5kg  2K bath  AVG  Heparin none - mircera 60 mcg q 2 wks, last 2/24, due 3/10 - hectorol 2 mcg IV three times per week - last OP HD 2/28, post wt 72.4kg, gets to dry wt   Assessment/Plan: Acute hypoxic resp failure - hx asthma, f/b pulm in OP setting. CCM following here,  suspecting sinusitis/bronchitis. Getting IV antibiotics, supportive care. No infiltrates on CXR. Improved with HD.  ESRD - on HD MWF. Continue on schedule.  HTN - BP's a bit high, cont home meds Volume - getting to dry wt at OP unit. Tolerated 2.6L  UF here w/o issue. Lower OP dry weight slightly.  Anemia of esrd - Hb 9-11 here, esa due on 3/10.  Secondary hyperparathyroidism - CCa and phos in range. Cont binders w/ meals.  Atrial fib - per pmd  Tomasa Blase PA-C [facility] 10/13/2023,12:08 PM

## 2023-10-13 NOTE — Progress Notes (Signed)
 Physical Therapy Treatment Patient Details Name: Paula [name] MRN: [id number] DOB: 1946-07-06 Today's Date: 10/13/2023   History of Present Illness Pt is a 78 y/o F admitted on 10/11/23 after presenting with c/o blurred vision & cough x 5 days. Pt is being treated for acute respiratory failure with hypoxia. PMH: diastolic CHF, asthma, DM2, ESRD on HD MWF, GERD, HLD, HTN, neuropathy    PT Comments  Patient eager to participate. On RA on arrival with sats 89-90%. Sats maintained at this level for supine and seated exercises. For ambulation with RW on RA dropped to 86%; on 1L 92-95% x 350 ft. Good progress.     If plan is discharge home, recommend the following: A little help with walking and/or transfers;A little help with bathing/dressing/bathroom;Assistance with cooking/housework;Assist for transportation;Help with stairs or ramp for entrance   Can travel by private vehicle        Equipment Recommendations  None recommended by PT    Recommendations for Other Services       Precautions / Restrictions Precautions Precautions: Fall Recall of Precautions/Restrictions: Intact Restrictions Weight Bearing Restrictions Per Provider Order: No     Mobility  Bed Mobility Overal bed mobility: Modified Independent             General bed mobility comments: supine<>sit with HOB elevated    Transfers Overall transfer level: Needs assistance Equipment used: Rolling walker (2 wheels) Transfers: Sit to/from Stand Sit to Stand: Min assist           General transfer comment: pt reaching hand out for assistance to come to stand; pt reports daughter usually helps her this way, sometimes even with both hands    Ambulation/Gait Ambulation/Gait assistance: Supervision (pt declines) Gait Distance (Feet): 350 Feet Assistive device: Rolling walker (2 wheels) Gait Pattern/deviations: Step-through pattern, Decreased stride length   Gait velocity interpretation: 1.31 - 2.62 ft/sec,  indicative of limited community ambulator   General Gait Details: required 1L O2, no imbalance, some difficulty turning RW as pt usually uses a Psychiatric nurse Bed    Modified Rankin (Stroke Patients Only)       Balance Overall balance assessment: Mild deficits observed, not formally tested                                          Communication Communication Communication: No apparent difficulties  Cognition Arousal: Alert Behavior During Therapy: WFL for tasks assessed/performed   PT - Cognitive impairments: No apparent impairments                         Following commands: Intact      Cueing    Exercises General Exercises - Lower Extremity Ankle Circles/Pumps: AROM, Both, 10 reps Quad Sets: AROM, Both, 10 reps Long Arc Quad: AROM, Both, 10 reps Heel Slides: AROM, Strengthening, Both, 10 reps (resisted extension)    General Comments General comments (skin integrity, edema, etc.): on RA on arrival with sats 89%; with bed level exercises sats 90% on RA; ambulating on RA 86% and 92-95% on 1L O2      Pertinent Vitals/Pain Pain Assessment Pain Assessment: No/denies pain    Home Living  Prior Function            PT Goals (current goals can now be found in the care plan section) Acute Rehab PT Goals Patient Stated Goal: get better, rest Time For Goal Achievement: 10/26/23 Potential to Achieve Goals: Good Progress towards PT goals: Progressing toward goals    Frequency    Min 1X/week      PT Plan      Co-evaluation              AM-PAC PT "6 Clicks" Mobility   Outcome Measure  Help needed turning from your back to your side while in a flat bed without using bedrails?: None Help needed moving from lying on your back to sitting on the side of a flat bed without using bedrails?: None Help needed moving to and from a bed to a chair  (including a wheelchair)?: A Little Help needed standing up from a chair using your arms (e.g., wheelchair or bedside chair)?: None Help needed to walk in hospital room?: A Little Help needed climbing 3-5 steps with a railing? : A Lot 6 Click Score: 20    End of Session Equipment Utilized During Treatment: Oxygen (pt refuses gait belt) Activity Tolerance: Patient tolerated treatment well Patient left: in bed;with call bell/phone within reach;with family/visitor present Nurse Communication: Mobility status PT Visit Diagnosis: Muscle weakness (generalized) (M62.81);Other abnormalities of gait and mobility (R26.89)     Time: 8469-6295 PT Time Calculation (min) (ACUTE ONLY): 34 min  Charges:    $Gait Training: 8-22 mins $Therapeutic Exercise: 8-22 mins PT General Charges $$ ACUTE PT VISIT: 1 Visit                      Jerolyn Center, PT Acute Rehabilitation Services  Office [phone]    Zena Amos 10/13/2023, 10:55 AM

## 2023-10-13 NOTE — Plan of Care (Signed)

## 2023-10-14 ENCOUNTER — Telehealth: Payer: Self-pay

## 2023-10-14 DIAGNOSIS — N2581 Secondary hyperparathyroidism of renal origin: Secondary | ICD-10-CM | POA: Diagnosis not present

## 2023-10-14 DIAGNOSIS — N186 End stage renal disease: Secondary | ICD-10-CM | POA: Diagnosis not present

## 2023-10-14 DIAGNOSIS — Z992 Dependence on renal dialysis: Secondary | ICD-10-CM | POA: Diagnosis not present

## 2023-10-14 LAB — HEPATITIS B SURFACE ANTIBODY, QUANTITATIVE: Hep B S AB Quant (Post): 1427 m[IU]/mL

## 2023-10-14 NOTE — Transitions of Care (Post Inpatient/ED Visit) (Signed)
   10/14/2023  Name: NIMRAT WOOLWORTH MRN: [id number] DOB: October 17, 1945  Today's TOC FU Call Status: Today's TOC FU Call Status:: Unsuccessful Call (1st Attempt) Unsuccessful Call (1st Attempt) Date: 10/14/23  Attempted to reach the patient regarding the most recent Inpatient/ED visit.  Follow Up Plan: Additional outreach attempts will be made to reach the patient to complete the Transitions of Care (Post Inpatient/ED visit) call.   Sierrah Luevano A. Mliss Fritz RN, BA, [facility], CRRN [facility] RN Care Manager, Transition of Care [phone]

## 2023-10-15 ENCOUNTER — Telehealth: Payer: Self-pay

## 2023-10-15 DIAGNOSIS — E113292 Type 2 diabetes mellitus with mild nonproliferative diabetic retinopathy without macular edema, left eye: Secondary | ICD-10-CM | POA: Diagnosis not present

## 2023-10-15 DIAGNOSIS — H35033 Hypertensive retinopathy, bilateral: Secondary | ICD-10-CM | POA: Diagnosis not present

## 2023-10-15 DIAGNOSIS — H35371 Puckering of macula, right eye: Secondary | ICD-10-CM | POA: Diagnosis not present

## 2023-10-15 DIAGNOSIS — H524 Presbyopia: Secondary | ICD-10-CM | POA: Diagnosis not present

## 2023-10-15 DIAGNOSIS — Z961 Presence of intraocular lens: Secondary | ICD-10-CM | POA: Diagnosis not present

## 2023-10-15 NOTE — Transitions of Care (Post Inpatient/ED Visit) (Signed)
   10/15/2023  Name: Paula [name] MRN: [id number] DOB: April 12, 1946  Today's TOC FU Call Status: Today's TOC FU Call Status:: Unsuccessful Call (2nd Attempt) Unsuccessful Call (2nd Attempt) Date: 10/15/23  Attempted to reach the patient regarding the most recent Inpatient/ED visit.  Follow Up Plan: RNCM - unsuccessful TOC follow up - Pt politely declined to engage & did not want call back, did state she was feeling better.  Yasiel Goyne A. Mliss Fritz RN, BA, [facility], CRRN [facility] RN Care Manager, Transition of Care [phone]

## 2023-10-16 DIAGNOSIS — N2581 Secondary hyperparathyroidism of renal origin: Secondary | ICD-10-CM | POA: Diagnosis not present

## 2023-10-16 DIAGNOSIS — N186 End stage renal disease: Secondary | ICD-10-CM | POA: Diagnosis not present

## 2023-10-16 DIAGNOSIS — Z992 Dependence on renal dialysis: Secondary | ICD-10-CM | POA: Diagnosis not present

## 2023-10-18 ENCOUNTER — Encounter: Payer: Self-pay | Admitting: Emergency Medicine

## 2023-10-19 DIAGNOSIS — N2581 Secondary hyperparathyroidism of renal origin: Secondary | ICD-10-CM | POA: Diagnosis not present

## 2023-10-19 DIAGNOSIS — Z992 Dependence on renal dialysis: Secondary | ICD-10-CM | POA: Diagnosis not present

## 2023-10-19 DIAGNOSIS — N186 End stage renal disease: Secondary | ICD-10-CM | POA: Diagnosis not present

## 2023-10-20 ENCOUNTER — Ambulatory Visit (HOSPITAL_COMMUNITY)
Admission: RE | Admit: 2023-10-20 | Discharge: 2023-10-20 | Disposition: A | Discharge status: Home / Self Care | Payer: Medicare HMO | Source: Ambulatory Visit | Attending: Interventional Radiology | Admitting: Interventional Radiology

## 2023-10-20 ENCOUNTER — Other Ambulatory Visit (HOSPITAL_COMMUNITY): Payer: Self-pay

## 2023-10-20 DIAGNOSIS — H52223 Regular astigmatism, bilateral: Secondary | ICD-10-CM | POA: Diagnosis not present

## 2023-10-20 DIAGNOSIS — I6523 Occlusion and stenosis of bilateral carotid arteries: Secondary | ICD-10-CM | POA: Diagnosis not present

## 2023-10-20 DIAGNOSIS — H524 Presbyopia: Secondary | ICD-10-CM | POA: Diagnosis not present

## 2023-10-20 DIAGNOSIS — I671 Cerebral aneurysm, nonruptured: Secondary | ICD-10-CM | POA: Diagnosis not present

## 2023-10-21 DIAGNOSIS — N186 End stage renal disease: Secondary | ICD-10-CM | POA: Diagnosis not present

## 2023-10-21 DIAGNOSIS — Z992 Dependence on renal dialysis: Secondary | ICD-10-CM | POA: Diagnosis not present

## 2023-10-21 DIAGNOSIS — N2581 Secondary hyperparathyroidism of renal origin: Secondary | ICD-10-CM | POA: Diagnosis not present

## 2023-10-22 ENCOUNTER — Telehealth: Payer: Self-pay

## 2023-10-22 NOTE — Telephone Encounter (Signed)
 Patient called [place] at night call center regarding elevated blood glucose levels. MD advises patient to increase Glipizide XR 2.5mg  to 2 tabs a day. Patient called no answer will retry and send to Mychart.

## 2023-10-23 DIAGNOSIS — N186 End stage renal disease: Secondary | ICD-10-CM | POA: Diagnosis not present

## 2023-10-23 DIAGNOSIS — N2581 Secondary hyperparathyroidism of renal origin: Secondary | ICD-10-CM | POA: Diagnosis not present

## 2023-10-23 DIAGNOSIS — Z992 Dependence on renal dialysis: Secondary | ICD-10-CM | POA: Diagnosis not present

## 2023-10-26 DIAGNOSIS — N186 End stage renal disease: Secondary | ICD-10-CM | POA: Diagnosis not present

## 2023-10-26 DIAGNOSIS — N2581 Secondary hyperparathyroidism of renal origin: Secondary | ICD-10-CM | POA: Diagnosis not present

## 2023-10-26 DIAGNOSIS — Z992 Dependence on renal dialysis: Secondary | ICD-10-CM | POA: Diagnosis not present

## 2023-10-28 DIAGNOSIS — Z992 Dependence on renal dialysis: Secondary | ICD-10-CM | POA: Diagnosis not present

## 2023-10-28 DIAGNOSIS — N2581 Secondary hyperparathyroidism of renal origin: Secondary | ICD-10-CM | POA: Diagnosis not present

## 2023-10-28 DIAGNOSIS — N186 End stage renal disease: Secondary | ICD-10-CM | POA: Diagnosis not present

## 2023-10-28 NOTE — Progress Notes (Unsigned)
 Patient ID: Paula [name], female   DOB: 11/29/1945, 78 y.o.   MRN: [id number]  After hospitalization 3/2 to 10/13/2023  Brief Summary: 78 year old with history of ESRD on HD, chronic HFpEF, A-fib on Eliquis-presented with cough/shortness of breath-she was found to have acute hypoxic respiratory failure-likely due to bronchitis.  She was subsequently admitted to the hospitalist service-see below for further details.   Significant studies 1/06>> TTE: EF 60-65%, severe LVH, RV systolic function normal. 3/02>> CTA chest: No PE/acute intrathoracic process-posterior bibasilar atelectasis. 3/02>> COVID/influenza/RSV PCR: Negative 3/02 >>respiratory virus panel: Negative   Consults: PCCM Nephrology   Brief Hospital Course: Acute hypoxic respiratory failure Unclear etiology but suspected to be bronchitis-no evidence of volume overload on exam or by imaging studies Managed with supportive care including antibiotics/bronchodilators with significant improvement Titrated to room air today but looks like she does not desaturate to 86 with ambulation-will be provided with home O2 until on discharge. Continue empiric antibiotics for a few more days-continue antitussives and bronchodilators.   ESRD on HD MWF Nephrology followed closely Resume hemodialysis per previous schedule.   Chronic HFpEF Volume removal with HD Coreg   HTN BP stable Coreg/amlodipine/hydralazine   DM-2 Resume oral hypoglycemics on discharge   PAF Coreg/Eliquis   Nutrition Status: Nutrition Problem: Increased nutrient needs Etiology: chronic illness Signs/Symptoms: estimated needs Interventions: Liberalize Diet     Discharge Diagnoses:  Principal Problem:   Acute respiratory failure with hypoxia (HCC) Active Problems:   Type 2 diabetes mellitus without complication, without long-term current use of insulin (HCC)   Essential hypertension   Paroxysmal A-fib (HCC)   Hyperlipidemia   Chronic diastolic CHF  (congestive heart failure) (HCC)   ILD (interstitial lung disease) (HCC)   ESRD on dialysis (HCC)   Acute hypoxic respiratory failure (HCC)   Blurry vision   Anemia due to chronic kidney disease   CHF (congestive heart failure) (HCC)

## 2023-10-29 ENCOUNTER — Telehealth: Payer: Self-pay | Admitting: Endocrinology

## 2023-10-29 ENCOUNTER — Ambulatory Visit: Attending: Physician Assistant | Admitting: Physician Assistant

## 2023-10-29 ENCOUNTER — Ambulatory Visit: Admitting: Endocrinology

## 2023-10-29 VITALS — BP 164/71 | HR 73 | Wt 161.0 lb

## 2023-10-29 DIAGNOSIS — Z7984 Long term (current) use of oral hypoglycemic drugs: Secondary | ICD-10-CM

## 2023-10-29 DIAGNOSIS — N184 Chronic kidney disease, stage 4 (severe): Secondary | ICD-10-CM | POA: Diagnosis not present

## 2023-10-29 DIAGNOSIS — R413 Other amnesia: Secondary | ICD-10-CM | POA: Diagnosis not present

## 2023-10-29 DIAGNOSIS — E1149 Type 2 diabetes mellitus with other diabetic neurological complication: Secondary | ICD-10-CM | POA: Diagnosis not present

## 2023-10-29 DIAGNOSIS — I651 Occlusion and stenosis of basilar artery: Secondary | ICD-10-CM

## 2023-10-29 DIAGNOSIS — I5032 Chronic diastolic (congestive) heart failure: Secondary | ICD-10-CM

## 2023-10-29 DIAGNOSIS — E1165 Type 2 diabetes mellitus with hyperglycemia: Secondary | ICD-10-CM

## 2023-10-29 DIAGNOSIS — E1122 Type 2 diabetes mellitus with diabetic chronic kidney disease: Secondary | ICD-10-CM | POA: Diagnosis not present

## 2023-10-29 DIAGNOSIS — I129 Hypertensive chronic kidney disease with stage 1 through stage 4 chronic kidney disease, or unspecified chronic kidney disease: Secondary | ICD-10-CM | POA: Diagnosis not present

## 2023-10-29 MED ORDER — CARVEDILOL 12.5 MG PO TABS: 12.5000 mg | ORAL_TABLET | Freq: Two times a day (BID) | ORAL | 0 refills | Status: DC

## 2023-10-29 MED ORDER — ATORVASTATIN CALCIUM 80 MG PO TABS: 80.0000 mg | ORAL_TABLET | Freq: Every day | ORAL | 0 refills | Status: DC

## 2023-10-29 MED ORDER — ALBUTEROL SULFATE HFA 108 (90 BASE) MCG/ACT IN AERS: 1.0000 | INHALATION_SPRAY | Freq: Four times a day (QID) | RESPIRATORY_TRACT | 2 refills | Status: AC | PRN

## 2023-10-29 MED ORDER — APIXABAN 5 MG PO TABS: 5.0000 mg | ORAL_TABLET | Freq: Two times a day (BID) | ORAL | 1 refills | Status: DC

## 2023-10-29 MED ORDER — ISOSORBIDE MONONITRATE ER 60 MG PO TB24: 60.0000 mg | ORAL_TABLET | Freq: Every day | ORAL | 0 refills | Status: DC

## 2023-10-29 MED ORDER — GABAPENTIN 100 MG PO CAPS: 100.0000 mg | ORAL_CAPSULE | Freq: Three times a day (TID) | ORAL | 1 refills | Status: DC

## 2023-10-29 MED ORDER — GLIPIZIDE ER 2.5 MG PO TB24: 2.5000 mg | ORAL_TABLET | Freq: Every day | ORAL | 1 refills | Status: DC | PRN

## 2023-10-29 NOTE — Telephone Encounter (Signed)
 Daughter is in office now requesting a urinalysis for diabetes Paula [name], etc).

## 2023-10-30 DIAGNOSIS — N186 End stage renal disease: Secondary | ICD-10-CM | POA: Diagnosis not present

## 2023-10-30 DIAGNOSIS — N2581 Secondary hyperparathyroidism of renal origin: Secondary | ICD-10-CM | POA: Diagnosis not present

## 2023-10-30 DIAGNOSIS — Z992 Dependence on renal dialysis: Secondary | ICD-10-CM | POA: Diagnosis not present

## 2023-11-02 DIAGNOSIS — N2581 Secondary hyperparathyroidism of renal origin: Secondary | ICD-10-CM | POA: Diagnosis not present

## 2023-11-02 DIAGNOSIS — Z992 Dependence on renal dialysis: Secondary | ICD-10-CM | POA: Diagnosis not present

## 2023-11-02 DIAGNOSIS — N186 End stage renal disease: Secondary | ICD-10-CM | POA: Diagnosis not present

## 2023-11-03 ENCOUNTER — Other Ambulatory Visit (HOSPITAL_COMMUNITY): Payer: Self-pay

## 2023-11-03 ENCOUNTER — Other Ambulatory Visit: Payer: Self-pay | Admitting: Family Medicine

## 2023-11-03 DIAGNOSIS — E1149 Type 2 diabetes mellitus with other diabetic neurological complication: Secondary | ICD-10-CM

## 2023-11-03 DIAGNOSIS — I5032 Chronic diastolic (congestive) heart failure: Secondary | ICD-10-CM

## 2023-11-03 DIAGNOSIS — E1165 Type 2 diabetes mellitus with hyperglycemia: Secondary | ICD-10-CM

## 2023-11-03 DIAGNOSIS — I651 Occlusion and stenosis of basilar artery: Secondary | ICD-10-CM

## 2023-11-03 DIAGNOSIS — E1122 Type 2 diabetes mellitus with diabetic chronic kidney disease: Secondary | ICD-10-CM

## 2023-11-03 NOTE — Telephone Encounter (Signed)
 Copied from CRM [id number]. Topic: Clinical - Prescription Issue >> Nov 03, 2023 11:42 AM Priscille Loveless wrote: Reason for CRM:  albuterol (PROAIR HFA) 108 (90 Base) MCG/ACT inhaler apixaban (ELIQUIS) 5 MG TABS tablet atorvastatin (LIPITOR) 80 MG tablet carvedilol (COREG) 12.5 MG tablet gabapentin (NEURONTIN) 100 MG capsule glipiZIDE (GLUCOTROL XL) 2.5 MG 24 hr isosorbide mononitrate (IMDUR) 60 MG 24 hr tablet    These was all sent to [facility], [id number] [place]  They need to be sent to: [facility] [address] [place]

## 2023-11-04 ENCOUNTER — Other Ambulatory Visit (HOSPITAL_COMMUNITY): Payer: Self-pay

## 2023-11-04 DIAGNOSIS — Z992 Dependence on renal dialysis: Secondary | ICD-10-CM | POA: Diagnosis not present

## 2023-11-04 DIAGNOSIS — N2581 Secondary hyperparathyroidism of renal origin: Secondary | ICD-10-CM | POA: Diagnosis not present

## 2023-11-04 DIAGNOSIS — N186 End stage renal disease: Secondary | ICD-10-CM | POA: Diagnosis not present

## 2023-11-04 MED ORDER — CARVEDILOL 12.5 MG PO TABS
12.5000 mg | ORAL_TABLET | Freq: Two times a day (BID) | ORAL | 0 refills | Status: DC
  Filled 2023-11-04 – 2023-11-20 (×2): qty 180, 90d supply, fill #0

## 2023-11-04 MED ORDER — APIXABAN 5 MG PO TABS
5.0000 mg | ORAL_TABLET | Freq: Two times a day (BID) | ORAL | 1 refills | Status: DC
  Filled 2023-11-04 – 2023-11-20 (×2): qty 180, 90d supply, fill #0
  Filled 2024-03-05: qty 180, 90d supply, fill #1

## 2023-11-04 MED ORDER — GLIPIZIDE ER 2.5 MG PO TB24
2.5000 mg | ORAL_TABLET | Freq: Every day | ORAL | 1 refills | Status: DC | PRN
  Filled 2023-11-04 – 2024-01-20 (×2): qty 90, 90d supply, fill #0
  Filled 2024-03-05 – 2024-04-25 (×2): qty 90, 90d supply, fill #1

## 2023-11-04 MED ORDER — ISOSORBIDE MONONITRATE ER 60 MG PO TB24
60.0000 mg | ORAL_TABLET | Freq: Every day | ORAL | 0 refills | Status: DC
  Filled 2023-11-04 – 2023-11-26 (×2): qty 90, 90d supply, fill #0

## 2023-11-04 MED ORDER — GABAPENTIN 100 MG PO CAPS
100.0000 mg | ORAL_CAPSULE | Freq: Three times a day (TID) | ORAL | 1 refills | Status: DC
  Filled 2023-11-04 – 2023-11-20 (×2): qty 270, 90d supply, fill #0
  Filled 2024-04-05 (×2): qty 270, 90d supply, fill #1

## 2023-11-04 MED ORDER — ATORVASTATIN CALCIUM 80 MG PO TABS
80.0000 mg | ORAL_TABLET | Freq: Every day | ORAL | 0 refills | Status: DC
  Filled 2023-11-04 – 2023-11-20 (×2): qty 90, 90d supply, fill #0

## 2023-11-04 MED ORDER — ALBUTEROL SULFATE (2.5 MG/3ML) 0.083% IN NEBU
2.5000 mg | INHALATION_SOLUTION | Freq: Four times a day (QID) | RESPIRATORY_TRACT | 1 refills | Status: DC | PRN
  Filled 2023-11-04 – 2023-11-05 (×2): qty 75, 7d supply, fill #0
  Filled 2024-03-11: qty 75, 7d supply, fill #1

## 2023-11-04 NOTE — Telephone Encounter (Signed)
 Pharmacy change. Requested Prescriptions  Pending Prescriptions Disp Refills   albuterol (PROVENTIL) (2.5 MG/3ML) 0.083% nebulizer solution 75 mL 1    Sig: Take 3 mLs (2.5 mg total) by nebulization every 6 (six) hours as needed for wheezing or shortness of breath.     Pulmonology:  Beta Agonists 2 Failed - 11/04/2023  3:42 PM      Failed - Last BP in normal range    BP Readings from Last 1 Encounters:  10/29/23 (!) 164/71         Passed - Last Heart Rate in normal range    Pulse Readings from Last 1 Encounters:  10/29/23 73         Passed - Valid encounter within last 12 months    Recent Outpatient Visits           6 days ago Memory loss   [place] [facility] [place] - A Dept Of [place]. [facility] [place], Marzella Schlein, [place]   1 month ago Encounter for [employer] annual wellness exam   [place] [facility] [place] - A Dept Of [place]. [facility] Hoy Register, MD   2 months ago Hospital discharge follow-up   [facility] Comm Health [place] - A Dept Of [place]. [facility] Jonah Blue B, MD   6 months ago Type 2 diabetes mellitus with hyperglycemia, unspecified whether long term insulin use (HCC)   [place] Comm Health Merry Proud - A Dept Of [place]. [facility], Marylene Land M, [place]   10 months ago Muscle spasm   [place] [facility] [place] - A Dept Of [place]. [facility] Hoy Register, MD       Future Appointments             In 1 month Rollene Rotunda, MD [facility] at [facility]   In 1 month Hoy Register, MD [facility] Comm Health [place] - A Dept Of [place]. [facility]   In 3 months Hoy Register, MD [facility] Health Comm Health [place] - A Dept Of [place]. [facility]             apixaban (ELIQUIS) 5 MG TABS tablet 180 tablet 1    Sig: Take 1 tablet (5 mg total) by mouth 2 (two) times daily.     Hematology:  Anticoagulants - apixaban Failed -  11/04/2023  3:42 PM      Failed - HGB in normal range and within 360 days    Hemoglobin  Date Value Ref Range Status  10/13/2023 10.3 (L) 12.0 - 15.0 g/dL Final  16/05/9603 54.0 11.1 - 15.9 g/dL Final         Failed - HCT in normal range and within 360 days    HCT  Date Value Ref Range Status  10/13/2023 29.9 (L) 36.0 - 46.0 % Final   Hematocrit  Date Value Ref Range Status  09/02/2018 37.3 34.0 - 46.6 % Final         Failed - Cr in normal range and within 360 days    Creat  Date Value Ref Range Status  06/27/2016 1.52 (H) 0.50 - 0.99 mg/dL Final    Comment:      For patients > or = 77 years of age: The upper reference limit for Creatinine is approximately 13% higher for people identified as African-American.      Creatinine, Ser  Date Value Ref Range Status  10/13/2023 5.46 (H) 0.44 - 1.00 mg/dL Final   Creatinine, Urine  Date Value Ref Range Status  10/14/2021 58.95 mg/dL Final    Comment:    Performed at [facility], [address] [place], [place] [address]         Passed - PLT in normal range and within 360 days    Platelets  Date Value Ref Range Status  10/13/2023 154 150 - 400 K/uL Final  09/02/2018 203 150 - 450 x10E3/uL Final         Passed - AST in normal range and within 360 days    AST  Date Value Ref Range Status  10/13/2023 19 15 - 41 U/L Final         Passed - ALT in normal range and within 360 days    ALT  Date Value Ref Range Status  10/13/2023 17 0 - 44 U/L Final         Passed - Valid encounter within last 12 months    Recent Outpatient Visits           6 days ago Memory loss   [place] [facility] [place] - A Dept Of [place]. [facility] [place], Marzella Schlein, [place]   1 month ago Encounter for [employer] annual wellness exam   [place] [facility] [place] - A Dept Of [place]. [facility] Hoy Register, MD   2 months ago Hospital discharge follow-up   [facility] Comm Health [place] - A Dept Of  [place]. [facility] Jonah Blue B, MD   6 months ago Type 2 diabetes mellitus with hyperglycemia, unspecified whether long term insulin use (HCC)   [place] Comm Health Merry Proud - A Dept Of [place]. [facility], Marylene Land M, [place]   10 months ago Muscle spasm   [place] [facility] Health [place] - A Dept Of [place]. [facility] Hoy Register, MD       Future Appointments             In 1 month Rollene Rotunda, MD [facility] at [facility]   In 1 month Hoy Register, MD [facility] Health Comm Health [place] - A Dept Of [place]. [facility]   In 3 months Hoy Register, MD [facility] Health Comm Health [place] - A Dept Of [place]. [facility]             atorvastatin (LIPITOR) 80 MG tablet 90 tablet 0    Sig: Take 1 tablet (80 mg total) by mouth daily.     Cardiovascular:  Antilipid - Statins Failed - 11/04/2023  3:42 PM      Failed - Lipid Panel in normal range within the last 12 months    Cholesterol, Total  Date Value Ref Range Status  06/17/2021 121 100 - 199 mg/dL Final   Cholesterol  Date Value Ref Range Status  09/01/2021 106 0 - 200 mg/dL Final   LDL Chol Calc (NIH)  Date Value Ref Range Status  06/17/2021 61 0 - 99 mg/dL Final   LDL Cholesterol  Date Value Ref Range Status  09/01/2021 48 0 - 99 mg/dL Final    Comment:           Total Cholesterol/HDL:CHD Risk Coronary Heart Disease Risk Table                     Men   Women  1/2 Average Risk   3.4   3.3  Average Risk       5.0   4.4  2 X Average Risk   9.6   7.1  3 X Average Risk  23.4   11.0        Use the calculated Patient Ratio above and the CHD Risk Table to determine the patient's CHD Risk.        ATP III CLASSIFICATION (LDL):  <100     mg/dL   Optimal  578-469  mg/dL   Near or Above                    Optimal  130-159  mg/dL   Borderline  629-528  mg/dL   High  >413     mg/dL   Very High Performed at [facility]  Lab, [address], [place], [place] [address]    HDL  Date Value Ref Range Status  09/01/2021 46 >40 mg/dL Final  02/72/5366 46 >44 mg/dL Final   Triglycerides  Date Value Ref Range Status  09/01/2021 61 <150 mg/dL Final         Passed - Patient is not pregnant      Passed - Valid encounter within last 12 months    Recent Outpatient Visits           6 days ago Memory loss   [place] [facility] [place] - A Dept Of [place]. [facility] [place], Marzella Schlein, [place]   1 month ago Encounter for [employer] annual wellness exam   [place] Comm [facility] [place] - A Dept Of [employer]. [facility] Hoy Register, MD   2 months ago Hospital discharge follow-up   [facility] Health Comm Health Dent - A Dept Of [place]. [facility] Jonah Blue B, MD   6 months ago Type 2 diabetes mellitus with hyperglycemia, unspecified whether long term insulin use (HCC)   [place] Comm Health Merry Proud - A Dept Of [place]. [facility], Marylene Land M, [place]   10 months ago Muscle spasm   [place] Comm Health [place] - A Dept Of [place]. [facility] Hoy Register, MD       Future Appointments             In 1 month Rollene Rotunda, MD [facility] Health HeartCare at [facility]   In 1 month Hoy Register, MD [facility] Health Comm Health [place] - A Dept Of [place]. [facility]   In 3 months Hoy Register, MD [facility] Health Comm Health [place] - A Dept Of [place]. [facility]             carvedilol (COREG) 12.5 MG tablet 180 tablet 0    Sig: Take 1 tablet (12.5 mg total) by mouth 2 (two) times daily with a meal. (Take once a day in the evening on hemodialysis days)     Cardiovascular: Beta Blockers 3 Failed - 11/04/2023  3:42 PM      Failed - Cr in normal range and within 360 days    Creat  Date Value Ref Range Status  06/27/2016 1.52 (H) 0.50 - 0.99 mg/dL Final    Comment:      For patients > or = 77 years of age: The  upper reference limit for Creatinine is approximately 13% higher for people identified as African-American.      Creatinine, Ser  Date Value  Ref Range Status  10/13/2023 5.46 (H) 0.44 - 1.00 mg/dL Final   Creatinine, Urine  Date Value Ref Range Status  10/14/2021 58.95 mg/dL Final    Comment:    Performed at [facility], [address], [place], [place] [address]         Failed - Last BP in normal range    BP Readings from Last 1 Encounters:  10/29/23 (!) 164/71         Passed - AST in normal range and within 360 days    AST  Date Value Ref Range Status  10/13/2023 19 15 - 41 U/L Final         Passed - ALT in normal range and within 360 days    ALT  Date Value Ref Range Status  10/13/2023 17 0 - 44 U/L Final         Passed - Last Heart Rate in normal range    Pulse Readings from Last 1 Encounters:  10/29/23 73         Passed - Valid encounter within last 6 months    Recent Outpatient Visits           6 days ago Memory loss   [place] Comm [facility] [place] - A Dept Of [place]. [facility] [place], Marzella Schlein, [place]   1 month ago Encounter for [employer] annual wellness exam   [place] Comm Health [place] - A Dept Of [place]. [facility] Hoy Register, MD   2 months ago Hospital discharge follow-up   [facility] Health Comm Health [place] - A Dept Of [place]. [facility] Jonah Blue B, MD   6 months ago Type 2 diabetes mellitus with hyperglycemia, unspecified whether long term insulin use (HCC)   [place] Comm Health Merry Proud - A Dept Of [place]. [facility], Marylene Land M, [place]   10 months ago Muscle spasm    Comm Health [place] - A Dept Of [place]. [facility] Hoy Register, MD       Future Appointments             In 1 month Rollene Rotunda, MD [facility] HeartCare at [facility]   In 1 month Hoy Register, MD [facility] Health Comm Health [place] - A Dept Of [place]. [facility]   In 3 months Hoy Register, MD [facility] Health Comm Health [place] - A Dept Of [place]. [facility]             gabapentin (NEURONTIN) 100 MG capsule 270 capsule 1    Sig: Take 1 capsule (100 mg total) by mouth 3 (three) times daily.     Neurology: Anticonvulsants - gabapentin Failed - 11/04/2023  3:42 PM      Failed - Cr in normal range and within 360 days    Creat  Date Value Ref Range Status  06/27/2016 1.52 (H) 0.50 - 0.99 mg/dL Final    Comment:      For patients > or = 77 years of age: The upper reference limit for Creatinine is approximately 13% higher for people identified as African-American.      Creatinine, Ser  Date Value Ref Range Status  10/13/2023 5.46 (H) 0.44 - 1.00 mg/dL Final   Creatinine, Urine  Date Value Ref Range Status  10/14/2021 58.95 mg/dL Final    Comment:    Performed at [facility], [address] [place], [place] [address]         Passed - Completed PHQ-2 or PHQ-9 in the last 360 days      Passed - Valid encounter within last 12 months    Recent Outpatient Visits           6 days ago Memory loss   [place] Comm Health [place] - A Dept Of [place]. [facility] [place], Marzella Schlein, [place]   1 month ago Encounter for [employer] annual wellness exam   [place] Comm Health [place] - A Dept Of [place]. [facility] Hoy Register, MD   2 months ago Hospital discharge follow-up   [facility] Health Comm Health [place] - A Dept Of [place]. [facility] Jonah Blue B, MD   6 months ago Type 2 diabetes mellitus with hyperglycemia, unspecified whether long term insulin use (HCC)   [place] Comm Health Merry Proud - A Dept Of [place]. [facility], Marylene Land M, [place]   10 months ago Muscle spasm    Comm Health [place] - A Dept Of [place]. [facility] Hoy Register, MD       Future Appointments             In 1 month Rollene Rotunda, MD [facility]  Health HeartCare at [facility]   In 1 month Hoy Register, MD [facility] Health Comm Health [place] - A Dept Of [place]. [facility]   In 3 months Hoy Register, MD [facility] Health Comm Health [place] - A Dept Of [place]. [facility]             glipiZIDE (GLUCOTROL XL) 2.5 MG 24 hr tablet 90 tablet 1    Sig: Take 1 tablet (2.5 mg total) by mouth daily as needed (for elevated BGL).     Endocrinology:  Diabetes - Sulfonylureas Failed - 11/04/2023  3:42 PM      Failed - Cr in normal range and within 360 days    Creat  Date Value Ref Range Status  06/27/2016 1.52 (H) 0.50 - 0.99 mg/dL Final    Comment:      For patients > or = 77 years of age: The upper reference limit for Creatinine is approximately 13% higher for people identified as African-American.      Creatinine, Ser  Date Value Ref Range Status  10/13/2023 5.46 (H) 0.44 - 1.00 mg/dL Final   Creatinine, Urine  Date Value Ref Range Status  10/14/2021 58.95 mg/dL Final    Comment:    Performed at [facility], [address], [place], [place] [address]         Passed - HBA1C is between 0 and 7.9 and within 180 days    HbA1c, POC (controlled diabetic range)  Date Value Ref Range Status  04/16/2023 6.5 0.0 - 7.0 % Final   Hgb A1c MFr Bld  Date Value Ref Range Status  08/17/2023 7.4 (H) 4.8 - 5.6 % Final    Comment:    (NOTE)         Prediabetes: 5.7 - 6.4         Diabetes: >6.4         Glycemic control for adults with diabetes: <7.0          Passed - Valid encounter within last 6 months    Recent Outpatient Visits           6 days  ago Memory loss   [place] Comm [facility] [place] - A Dept Of [place]. [facility] [place], Marzella Schlein, [place]   1 month ago Encounter for [employer] annual wellness exam   [place] Comm Health [place] - A Dept Of [place]. [facility] Hoy Register, MD   2 months ago Hospital discharge follow-up   [facility] Health Comm Health [place] -  A Dept Of [place]. [facility] Jonah Blue B, MD   6 months ago Type 2 diabetes mellitus with hyperglycemia, unspecified whether long term insulin use (HCC)   [place] Comm Health Merry Proud - A Dept Of [place]. [facility], Marylene Land M, [place]   10 months ago Muscle spasm   [place] Comm Health [place] - A Dept Of [place]. [facility] Hoy Register, MD       Future Appointments             In 1 month Rollene Rotunda, MD [facility] Health HeartCare at [facility]   In 1 month Hoy Register, MD [facility] Health Comm Health [place] - A Dept Of [place]. [facility]   In 3 months Hoy Register, MD [facility] Health Comm Health C[place] - A Dept Of [place]. [facility]             isosorbide mononitrate (IMDUR) 60 MG 24 hr tablet 90 tablet 0    Sig: Take 1 tablet (60 mg total) by mouth daily.     Cardiovascular:  Nitrates Failed - 11/04/2023  3:42 PM      Failed - Last BP in normal range    BP Readings from Last 1 Encounters:  10/29/23 (!) 164/71         Passed - Last Heart Rate in normal range    Pulse Readings from Last 1 Encounters:  10/29/23 73         Passed - Valid encounter within last 12 months    Recent Outpatient Visits           6 days ago Memory loss   [place] [facility] Health [place] - A Dept Of [place]. [facility] [place], Marzella Schlein, [place]   1 month ago Encounter for [employer] annual wellness exam   [place] Comm Health [place] - A Dept Of [place]. [facility] Hoy Register, MD   2 months ago Hospital discharge follow-up   [facility] Health Comm Health [place] - A Dept Of [place]. [facility] Jonah Blue B, MD   6 months ago Type 2 diabetes mellitus with hyperglycemia, unspecified whether long term insulin use (HCC)   [place] Comm Health Merry Proud - A Dept Of [place]. [facility], Marylene Land M, [place]   10 months ago Muscle spasm    Comm Health  [place] - A Dept Of [place]. [facility] Hoy Register, MD       Future Appointments             In 1 month Rollene Rotunda, MD [facility] Health HeartCare at [facility]   In 1 month Hoy Register, MD [facility] Health Comm Health [place] - A Dept Of [place]. [facility]   In 3 months Hoy Register, MD [facility] Health Comm Health [place] - A Dept Of [place]. [facility]

## 2023-11-05 ENCOUNTER — Other Ambulatory Visit (HOSPITAL_COMMUNITY): Payer: Self-pay

## 2023-11-05 ENCOUNTER — Other Ambulatory Visit (HOSPITAL_BASED_OUTPATIENT_CLINIC_OR_DEPARTMENT_OTHER): Payer: Self-pay

## 2023-11-05 ENCOUNTER — Other Ambulatory Visit: Payer: Self-pay

## 2023-11-06 DIAGNOSIS — N186 End stage renal disease: Secondary | ICD-10-CM | POA: Diagnosis not present

## 2023-11-06 DIAGNOSIS — N2581 Secondary hyperparathyroidism of renal origin: Secondary | ICD-10-CM | POA: Diagnosis not present

## 2023-11-06 DIAGNOSIS — Z992 Dependence on renal dialysis: Secondary | ICD-10-CM | POA: Diagnosis not present

## 2023-11-09 DIAGNOSIS — N2581 Secondary hyperparathyroidism of renal origin: Secondary | ICD-10-CM | POA: Diagnosis not present

## 2023-11-09 DIAGNOSIS — I129 Hypertensive chronic kidney disease with stage 1 through stage 4 chronic kidney disease, or unspecified chronic kidney disease: Secondary | ICD-10-CM | POA: Diagnosis not present

## 2023-11-09 DIAGNOSIS — N186 End stage renal disease: Secondary | ICD-10-CM | POA: Diagnosis not present

## 2023-11-09 DIAGNOSIS — Z992 Dependence on renal dialysis: Secondary | ICD-10-CM | POA: Diagnosis not present

## 2023-11-11 DIAGNOSIS — N2581 Secondary hyperparathyroidism of renal origin: Secondary | ICD-10-CM | POA: Diagnosis not present

## 2023-11-11 DIAGNOSIS — N186 End stage renal disease: Secondary | ICD-10-CM | POA: Diagnosis not present

## 2023-11-11 DIAGNOSIS — Z992 Dependence on renal dialysis: Secondary | ICD-10-CM | POA: Diagnosis not present

## 2023-11-13 DIAGNOSIS — N186 End stage renal disease: Secondary | ICD-10-CM | POA: Diagnosis not present

## 2023-11-13 DIAGNOSIS — N2581 Secondary hyperparathyroidism of renal origin: Secondary | ICD-10-CM | POA: Diagnosis not present

## 2023-11-13 DIAGNOSIS — Z992 Dependence on renal dialysis: Secondary | ICD-10-CM | POA: Diagnosis not present

## 2023-11-14 DIAGNOSIS — N186 End stage renal disease: Secondary | ICD-10-CM | POA: Diagnosis not present

## 2023-11-14 DIAGNOSIS — Z992 Dependence on renal dialysis: Secondary | ICD-10-CM | POA: Diagnosis not present

## 2023-11-14 DIAGNOSIS — J9610 Chronic respiratory failure, unspecified whether with hypoxia or hypercapnia: Secondary | ICD-10-CM | POA: Diagnosis not present

## 2023-11-16 ENCOUNTER — Telehealth (HOSPITAL_COMMUNITY): Payer: Self-pay

## 2023-11-16 DIAGNOSIS — N186 End stage renal disease: Secondary | ICD-10-CM | POA: Diagnosis not present

## 2023-11-16 DIAGNOSIS — N2581 Secondary hyperparathyroidism of renal origin: Secondary | ICD-10-CM | POA: Diagnosis not present

## 2023-11-16 DIAGNOSIS — Z992 Dependence on renal dialysis: Secondary | ICD-10-CM | POA: Diagnosis not present

## 2023-11-16 NOTE — Telephone Encounter (Signed)
Called regarding recent imaging, no answer, left vm. AB

## 2023-11-17 ENCOUNTER — Telehealth (HOSPITAL_COMMUNITY): Payer: Self-pay

## 2023-11-17 NOTE — Telephone Encounter (Signed)
Pt agreed to f/u in 1 year with an mra head wo. AB

## 2023-11-18 DIAGNOSIS — N2581 Secondary hyperparathyroidism of renal origin: Secondary | ICD-10-CM | POA: Diagnosis not present

## 2023-11-18 DIAGNOSIS — Z992 Dependence on renal dialysis: Secondary | ICD-10-CM | POA: Diagnosis not present

## 2023-11-18 DIAGNOSIS — N186 End stage renal disease: Secondary | ICD-10-CM | POA: Diagnosis not present

## 2023-11-19 ENCOUNTER — Ambulatory Visit: Payer: Medicare HMO | Admitting: Cardiology

## 2023-11-19 ENCOUNTER — Ambulatory Visit: Payer: Medicare HMO | Admitting: Endocrinology

## 2023-11-20 ENCOUNTER — Encounter (HOSPITAL_COMMUNITY): Payer: Self-pay

## 2023-11-20 ENCOUNTER — Other Ambulatory Visit (HOSPITAL_COMMUNITY): Payer: Self-pay

## 2023-11-20 ENCOUNTER — Other Ambulatory Visit: Payer: Self-pay

## 2023-11-20 DIAGNOSIS — N186 End stage renal disease: Secondary | ICD-10-CM | POA: Diagnosis not present

## 2023-11-20 DIAGNOSIS — N2581 Secondary hyperparathyroidism of renal origin: Secondary | ICD-10-CM | POA: Diagnosis not present

## 2023-11-20 DIAGNOSIS — Z992 Dependence on renal dialysis: Secondary | ICD-10-CM | POA: Diagnosis not present

## 2023-11-23 DIAGNOSIS — N186 End stage renal disease: Secondary | ICD-10-CM | POA: Diagnosis not present

## 2023-11-23 DIAGNOSIS — N2581 Secondary hyperparathyroidism of renal origin: Secondary | ICD-10-CM | POA: Diagnosis not present

## 2023-11-23 DIAGNOSIS — Z992 Dependence on renal dialysis: Secondary | ICD-10-CM | POA: Diagnosis not present

## 2023-11-24 DIAGNOSIS — D631 Anemia in chronic kidney disease: Secondary | ICD-10-CM | POA: Diagnosis not present

## 2023-11-24 DIAGNOSIS — M1990 Unspecified osteoarthritis, unspecified site: Secondary | ICD-10-CM | POA: Diagnosis not present

## 2023-11-24 DIAGNOSIS — N186 End stage renal disease: Secondary | ICD-10-CM | POA: Diagnosis not present

## 2023-11-24 DIAGNOSIS — I480 Paroxysmal atrial fibrillation: Secondary | ICD-10-CM | POA: Diagnosis not present

## 2023-11-24 DIAGNOSIS — J9601 Acute respiratory failure with hypoxia: Secondary | ICD-10-CM | POA: Diagnosis not present

## 2023-11-24 DIAGNOSIS — E1122 Type 2 diabetes mellitus with diabetic chronic kidney disease: Secondary | ICD-10-CM | POA: Diagnosis not present

## 2023-11-24 DIAGNOSIS — I5033 Acute on chronic diastolic (congestive) heart failure: Secondary | ICD-10-CM | POA: Diagnosis not present

## 2023-11-24 DIAGNOSIS — I132 Hypertensive heart and chronic kidney disease with heart failure and with stage 5 chronic kidney disease, or end stage renal disease: Secondary | ICD-10-CM | POA: Diagnosis not present

## 2023-11-24 DIAGNOSIS — J45909 Unspecified asthma, uncomplicated: Secondary | ICD-10-CM | POA: Diagnosis not present

## 2023-11-25 ENCOUNTER — Encounter (HOSPITAL_COMMUNITY): Payer: Self-pay

## 2023-11-25 ENCOUNTER — Other Ambulatory Visit (HOSPITAL_COMMUNITY): Payer: Self-pay

## 2023-11-25 ENCOUNTER — Telehealth: Payer: Self-pay | Admitting: Family Medicine

## 2023-11-25 ENCOUNTER — Other Ambulatory Visit: Payer: Self-pay

## 2023-11-25 DIAGNOSIS — N2581 Secondary hyperparathyroidism of renal origin: Secondary | ICD-10-CM | POA: Diagnosis not present

## 2023-11-25 DIAGNOSIS — N186 End stage renal disease: Secondary | ICD-10-CM | POA: Diagnosis not present

## 2023-11-25 DIAGNOSIS — Z992 Dependence on renal dialysis: Secondary | ICD-10-CM | POA: Diagnosis not present

## 2023-11-25 MED ORDER — BENZONATATE 100 MG PO CAPS
100.0000 mg | ORAL_CAPSULE | Freq: Three times a day (TID) | ORAL | 1 refills | Status: DC | PRN
  Filled 2023-11-25 – 2023-12-15 (×2): qty 90, 30d supply, fill #0
  Filled 2024-03-11: qty 90, 30d supply, fill #1

## 2023-11-25 NOTE — Telephone Encounter (Signed)
 FYI  Copied from CRM [id number]. Topic: Clinical - Home Health Verbal Orders  >> Nov 25, 2023  4:13 PM Sasha H wrote:  Caller/Agency: [facility] Callback Number: [phone] Service Requested: PT Frequency: 2 week 8 / 1 week 1 Any new concerns about the patient? Drug Interaction CORVEDILOL AND VENTOLIN  AND FLUTICASON AND CORVEDILOL

## 2023-11-25 NOTE — Telephone Encounter (Signed)
 Will forward to provider. Paula [name] is wondering about the drug interactions

## 2023-11-26 ENCOUNTER — Other Ambulatory Visit (HOSPITAL_COMMUNITY): Payer: Self-pay

## 2023-11-26 ENCOUNTER — Other Ambulatory Visit: Payer: Self-pay

## 2023-11-26 ENCOUNTER — Other Ambulatory Visit: Payer: Self-pay | Admitting: Family Medicine

## 2023-11-26 ENCOUNTER — Telehealth: Payer: Self-pay | Admitting: Endocrinology

## 2023-11-26 ENCOUNTER — Telehealth: Payer: Self-pay | Admitting: Family Medicine

## 2023-11-26 DIAGNOSIS — R058 Other specified cough: Secondary | ICD-10-CM | POA: Diagnosis not present

## 2023-11-26 DIAGNOSIS — H6123 Impacted cerumen, bilateral: Secondary | ICD-10-CM | POA: Insufficient documentation

## 2023-11-26 DIAGNOSIS — R42 Dizziness and giddiness: Secondary | ICD-10-CM | POA: Diagnosis not present

## 2023-11-26 DIAGNOSIS — M26609 Unspecified temporomandibular joint disorder, unspecified side: Secondary | ICD-10-CM | POA: Diagnosis not present

## 2023-11-26 MED ORDER — FLUTICASONE-SALMETEROL 100-50 MCG/ACT IN AEPB
1.0000 | INHALATION_SPRAY | Freq: Two times a day (BID) | RESPIRATORY_TRACT | 2 refills | Status: DC
  Filled 2023-11-26: qty 60, 30d supply, fill #0
  Filled 2024-01-20: qty 60, 30d supply, fill #1
  Filled 2024-02-20: qty 60, 30d supply, fill #2

## 2023-11-26 MED ORDER — MONTELUKAST SODIUM 10 MG PO TABS
10.0000 mg | ORAL_TABLET | Freq: Every day | ORAL | 1 refills | Status: DC
  Filled 2023-11-26: qty 90, 90d supply, fill #0

## 2023-11-26 NOTE — Telephone Encounter (Signed)
 Patient's daughter is calling with questions about  linagliptin linagliptin (TRADJENTA) 5 MG TABS tablet.

## 2023-11-26 NOTE — Telephone Encounter (Signed)
 Okay to proceed with PT.  Okay to continue with current medications.  Thank you.

## 2023-11-26 NOTE — Telephone Encounter (Signed)
 Returned [employer] and went over provider response no questions or concerns

## 2023-11-26 NOTE — Telephone Encounter (Signed)
 Copied from CRM [id number]. Topic: Clinical - Medication Refill >> Nov 26, 2023 10:32 AM Juluis Ok wrote: Most Recent Primary Care Visit:  Provider: Hassie Lint  Department: [facility]  Visit Type: HOSPITAL FOLLOW UP  Date: 10/29/2023  Medication: fluticasone-salmeterol (ADVAIR) 100-50 MCG/ACT AEPB montelukast (SINGULAIR) 10 MG tablet  Has the patient contacted their pharmacy? Yes (Agent: If no, request that the patient contact the pharmacy for the refill. If patient does not wish to contact the pharmacy document the reason why and proceed with request.) (Agent: If yes, when and what did the pharmacy advise?)  Is this the correct pharmacy for this prescription? Yes If no, delete pharmacy and type the correct one.  This is the patient's preferred pharmacy:  [facility] [address] [place] [address] Phone: [phone] Fax: [phone]    Has the prescription been filled recently? No  Is the patient out of the medication? Yes  Has the patient been seen for an appointment in the last year OR does the patient have an upcoming appointment? Yes  Can we respond through MyChart? No  Agent: Please be advised that Rx refills may take up to 3 business days. We ask that you follow-up with your pharmacy.

## 2023-11-26 NOTE — Telephone Encounter (Signed)
 FYI  Copied from CRM [id number]. Topic: Clinical - Home Health Verbal Orders >> Nov 26, 2023 11:02 AM DeAngela L wrote:  Caller/Agency: Will calling with [facility], calling cause the patients family would like to move the OT evaluation to next week "possibly Tuesday she only has openings on Tuesdays" Callback Number: [phone] Service Requested: Occupational Therapy Frequency: he has not evaluated the patient yet  Any new concerns about the patient? No

## 2023-11-26 NOTE — Telephone Encounter (Signed)
 Copied from CRM [id number]. Topic: Clinical - Medication Refill >> Nov 26, 2023 10:36 AM Juluis Ok wrote: Most Recent Primary Care Visit:  Provider: Hassie Lint  Department: [facility]  Visit Type: HOSPITAL FOLLOW UP  Date: 10/29/2023  Medication: glucose blood (ACCU-CHEK GUIDE) test strip  Has the patient contacted their pharmacy? Yes (Agent: If no, request that the patient contact the pharmacy for the refill. If patient does not wish to contact the pharmacy document the reason why and proceed with request.) (Agent: If yes, when and what did the pharmacy advise?)  Is this the correct pharmacy for this prescription? Yes If no, delete pharmacy and type the correct one.  This is the patient's preferred pharmacy:    [facility] [address], [place] - [address] [place] [address] Phone: [phone] Fax: [phone]     Has the prescription been filled recently? No  Is the patient out of the medication? Yes  Has the patient been seen for an appointment in the last year OR does the patient have an upcoming appointment? Yes  Can we respond through MyChart? No  Agent: Please be advised that Rx refills may take up to 3 business days. We ask that you follow-up with your pharmacy.

## 2023-11-26 NOTE — Telephone Encounter (Signed)
 Returned call to Will and gave verbal order

## 2023-11-27 ENCOUNTER — Other Ambulatory Visit (HOSPITAL_COMMUNITY): Payer: Self-pay

## 2023-11-27 ENCOUNTER — Other Ambulatory Visit: Payer: Self-pay

## 2023-11-27 DIAGNOSIS — N2581 Secondary hyperparathyroidism of renal origin: Secondary | ICD-10-CM | POA: Diagnosis not present

## 2023-11-27 DIAGNOSIS — Z992 Dependence on renal dialysis: Secondary | ICD-10-CM | POA: Diagnosis not present

## 2023-11-27 DIAGNOSIS — N186 End stage renal disease: Secondary | ICD-10-CM | POA: Diagnosis not present

## 2023-11-27 NOTE — Telephone Encounter (Signed)
 Requested Prescriptions  Refused Prescriptions Disp Refills   fluticasone -salmeterol (ADVAIR ) 100-50 MCG/ACT AEPB 60 each 2    Sig: Inhale 1 puff into the lungs 2 (two) times daily.     Pulmonology:  Combination Products Passed - 11/27/2023  9:59 AM      Passed - Valid encounter within last 12 months    Recent Outpatient Visits           4 weeks ago Memory loss   [place] [facility] [place] [facility] Dept [facility] [place]. [facility] [place], Stan Eans, [place]   2 months ago Encounter for [employer] annual wellness exam   [place] [facility] [place] - A Dept Of [place]. [facility] Joaquin Mulberry, MD   3 months ago Hospital discharge follow-up   [facility] [place] - A Dept Of [place]. [facility] Concetta Dee B, MD   7 months ago Type 2 diabetes mellitus with hyperglycemia, unspecified whether long term insulin  use [facility])   [place] [facility] [place] [place] - A Dept Of [place]. [facility] [place], Shelvy Dickens M, [place]   10 months ago Muscle spasm   [place] [facility] [place] - A Dept Of [place]. [facility] Joaquin Mulberry, MD       Future Appointments             In 1 week Eilleen Grates, MD [facility]   In 2 weeks Joaquin Mulberry, MD [facility] [place] - A Dept Of [place]. [facility]   In 3 months Joaquin Mulberry, MD [facility] [place] - A Dept Of [place]. [facility]             montelukast  (SINGULAIR ) 10 MG tablet 90 tablet 0    Sig: Take 1 tablet (10 mg total) by mouth at bedtime.     Pulmonology:  Leukotriene Inhibitors Passed - 11/27/2023  9:59 AM      Passed - Valid encounter within last 12 months    Recent Outpatient Visits           4 weeks ago Memory loss   [place] [facility] [place] - [facility] Dept Of [place]. [facility] [place], Stan Eans, [place]   2 months ago Encounter for [employer] annual wellness exam   [facility] [place] - A Dept Of [place]. [facility] Joaquin Mulberry, MD   3 months ago Hospital discharge follow-up   [facility] [place] - A Dept Of [place]. [facility] Concetta Dee B, MD   7 months ago Type 2 diabetes mellitus with hyperglycemia, unspecified whether long term insulin  use [facility])   [place] [facility] - A Dept Of [place]. [facility], Shelvy Dickens M, [place]   10 months ago Muscle spasm    [facility] [place] - A Dept Of [place]. [facility] Joaquin Mulberry, MD       Future Appointments             In 1 week Eilleen Grates, MD [facility] at [facility]   In 2 weeks Joaquin Mulberry, MD [facility] [place] - A Dept Of [place]. [facility]   In 3 months Joaquin Mulberry, MD [facility] [place] - A Dept Of . [facility]

## 2023-11-30 DIAGNOSIS — N186 End stage renal disease: Secondary | ICD-10-CM | POA: Diagnosis not present

## 2023-11-30 DIAGNOSIS — N2581 Secondary hyperparathyroidism of renal origin: Secondary | ICD-10-CM | POA: Diagnosis not present

## 2023-11-30 DIAGNOSIS — Z992 Dependence on renal dialysis: Secondary | ICD-10-CM | POA: Diagnosis not present

## 2023-11-30 NOTE — Telephone Encounter (Signed)
 VM left for patient's daughter to answer questions daughter had regarding medication.

## 2023-12-01 ENCOUNTER — Ambulatory Visit: Admitting: Endocrinology

## 2023-12-01 ENCOUNTER — Other Ambulatory Visit: Payer: Self-pay

## 2023-12-01 DIAGNOSIS — I480 Paroxysmal atrial fibrillation: Secondary | ICD-10-CM | POA: Diagnosis not present

## 2023-12-01 DIAGNOSIS — I5033 Acute on chronic diastolic (congestive) heart failure: Secondary | ICD-10-CM | POA: Diagnosis not present

## 2023-12-01 DIAGNOSIS — E1122 Type 2 diabetes mellitus with diabetic chronic kidney disease: Secondary | ICD-10-CM | POA: Diagnosis not present

## 2023-12-01 DIAGNOSIS — D631 Anemia in chronic kidney disease: Secondary | ICD-10-CM | POA: Diagnosis not present

## 2023-12-01 DIAGNOSIS — J45909 Unspecified asthma, uncomplicated: Secondary | ICD-10-CM | POA: Diagnosis not present

## 2023-12-01 DIAGNOSIS — M1990 Unspecified osteoarthritis, unspecified site: Secondary | ICD-10-CM | POA: Diagnosis not present

## 2023-12-01 DIAGNOSIS — J9601 Acute respiratory failure with hypoxia: Secondary | ICD-10-CM | POA: Diagnosis not present

## 2023-12-01 DIAGNOSIS — N186 End stage renal disease: Secondary | ICD-10-CM | POA: Diagnosis not present

## 2023-12-01 DIAGNOSIS — I132 Hypertensive heart and chronic kidney disease with heart failure and with stage 5 chronic kidney disease, or end stage renal disease: Secondary | ICD-10-CM | POA: Diagnosis not present

## 2023-12-02 DIAGNOSIS — Z992 Dependence on renal dialysis: Secondary | ICD-10-CM | POA: Diagnosis not present

## 2023-12-02 DIAGNOSIS — N186 End stage renal disease: Secondary | ICD-10-CM | POA: Diagnosis not present

## 2023-12-02 DIAGNOSIS — N2581 Secondary hyperparathyroidism of renal origin: Secondary | ICD-10-CM | POA: Diagnosis not present

## 2023-12-03 ENCOUNTER — Telehealth: Payer: Self-pay | Admitting: Family Medicine

## 2023-12-03 DIAGNOSIS — N186 End stage renal disease: Secondary | ICD-10-CM | POA: Diagnosis not present

## 2023-12-03 DIAGNOSIS — D631 Anemia in chronic kidney disease: Secondary | ICD-10-CM | POA: Diagnosis not present

## 2023-12-03 DIAGNOSIS — M1990 Unspecified osteoarthritis, unspecified site: Secondary | ICD-10-CM | POA: Diagnosis not present

## 2023-12-03 DIAGNOSIS — I5033 Acute on chronic diastolic (congestive) heart failure: Secondary | ICD-10-CM | POA: Diagnosis not present

## 2023-12-03 DIAGNOSIS — I132 Hypertensive heart and chronic kidney disease with heart failure and with stage 5 chronic kidney disease, or end stage renal disease: Secondary | ICD-10-CM | POA: Diagnosis not present

## 2023-12-03 DIAGNOSIS — J45909 Unspecified asthma, uncomplicated: Secondary | ICD-10-CM | POA: Diagnosis not present

## 2023-12-03 DIAGNOSIS — E1122 Type 2 diabetes mellitus with diabetic chronic kidney disease: Secondary | ICD-10-CM | POA: Diagnosis not present

## 2023-12-03 DIAGNOSIS — I480 Paroxysmal atrial fibrillation: Secondary | ICD-10-CM | POA: Diagnosis not present

## 2023-12-03 DIAGNOSIS — J9601 Acute respiratory failure with hypoxia: Secondary | ICD-10-CM | POA: Diagnosis not present

## 2023-12-03 NOTE — Telephone Encounter (Signed)
 FYI Copied from CRM [id number]. Topic: Clinical - Home Health Verbal Orders  >> Dec 03, 2023  4:11 PM Leory Rands wrote:  Will OT [facility] is calling to report that the patient is refusing OT services. [phone]

## 2023-12-03 NOTE — Telephone Encounter (Signed)
 FYI

## 2023-12-04 DIAGNOSIS — Z992 Dependence on renal dialysis: Secondary | ICD-10-CM | POA: Diagnosis not present

## 2023-12-04 DIAGNOSIS — N186 End stage renal disease: Secondary | ICD-10-CM | POA: Diagnosis not present

## 2023-12-04 DIAGNOSIS — N2581 Secondary hyperparathyroidism of renal origin: Secondary | ICD-10-CM | POA: Diagnosis not present

## 2023-12-07 DIAGNOSIS — N186 End stage renal disease: Secondary | ICD-10-CM | POA: Diagnosis not present

## 2023-12-07 DIAGNOSIS — Z992 Dependence on renal dialysis: Secondary | ICD-10-CM | POA: Diagnosis not present

## 2023-12-07 DIAGNOSIS — N2581 Secondary hyperparathyroidism of renal origin: Secondary | ICD-10-CM | POA: Diagnosis not present

## 2023-12-09 ENCOUNTER — Other Ambulatory Visit: Payer: Self-pay | Admitting: Physician Assistant

## 2023-12-09 DIAGNOSIS — Z992 Dependence on renal dialysis: Secondary | ICD-10-CM | POA: Diagnosis not present

## 2023-12-09 DIAGNOSIS — N2581 Secondary hyperparathyroidism of renal origin: Secondary | ICD-10-CM | POA: Diagnosis not present

## 2023-12-09 DIAGNOSIS — E1149 Type 2 diabetes mellitus with other diabetic neurological complication: Secondary | ICD-10-CM

## 2023-12-09 DIAGNOSIS — I129 Hypertensive chronic kidney disease with stage 1 through stage 4 chronic kidney disease, or unspecified chronic kidney disease: Secondary | ICD-10-CM | POA: Diagnosis not present

## 2023-12-09 DIAGNOSIS — N186 End stage renal disease: Secondary | ICD-10-CM | POA: Diagnosis not present

## 2023-12-10 ENCOUNTER — Ambulatory Visit: Payer: Self-pay

## 2023-12-10 ENCOUNTER — Ambulatory Visit: Payer: Medicare HMO | Admitting: Cardiology

## 2023-12-10 DIAGNOSIS — E1122 Type 2 diabetes mellitus with diabetic chronic kidney disease: Secondary | ICD-10-CM | POA: Diagnosis not present

## 2023-12-10 DIAGNOSIS — M1990 Unspecified osteoarthritis, unspecified site: Secondary | ICD-10-CM | POA: Diagnosis not present

## 2023-12-10 DIAGNOSIS — J9601 Acute respiratory failure with hypoxia: Secondary | ICD-10-CM | POA: Diagnosis not present

## 2023-12-10 DIAGNOSIS — J45909 Unspecified asthma, uncomplicated: Secondary | ICD-10-CM | POA: Diagnosis not present

## 2023-12-10 DIAGNOSIS — I480 Paroxysmal atrial fibrillation: Secondary | ICD-10-CM | POA: Diagnosis not present

## 2023-12-10 DIAGNOSIS — D631 Anemia in chronic kidney disease: Secondary | ICD-10-CM | POA: Diagnosis not present

## 2023-12-10 DIAGNOSIS — N186 End stage renal disease: Secondary | ICD-10-CM | POA: Diagnosis not present

## 2023-12-10 DIAGNOSIS — I132 Hypertensive heart and chronic kidney disease with heart failure and with stage 5 chronic kidney disease, or end stage renal disease: Secondary | ICD-10-CM | POA: Diagnosis not present

## 2023-12-10 DIAGNOSIS — I5033 Acute on chronic diastolic (congestive) heart failure: Secondary | ICD-10-CM | POA: Diagnosis not present

## 2023-12-10 NOTE — Telephone Encounter (Signed)
 Chief Complaint: Fall on Monday night/Low blood pressure readings Symptoms: BP 104/45 with PT, fall on Monday night-soreness to back, shoulders.  Frequency: Monday Pertinent Negatives: Patient denies CP, SOB Disposition: [] ED /[] Urgent Care (no appt availability in office) / [] Appointment(In office/virtual)/ []  [place] [facility]/ [] Home Care/ [] Refused Recommended Disposition /[] [place] [facility]/ [x]  Follow-up with PCP Additional Notes: John with PT called to report patient's fall on Monday night along with low BP readings with some episodes of dizziness. Patient is a dialysis patient. BP with PT was 104/45-BP this morning was 123/43. Patient reports that she has passed out at dialysis before due to blood pressure. Patient states that she has generalized body pain and soreness but is denying any need to be seen currently. Patient is able to walk with steady gait per PT report. Per protocol, patient is recommended to be seen in the next three days. Patient is scheduled for an office visit with PCP on 12/15/2023. Patient is instructed to watch her BP readings-if her blood pressure is low with dizziness or if pain level increases, the recommendation is to be seen in the ED. Patient's family verbalizes understanding and all questions answered.    Copied from CRM [id number]. Topic: Clinical - Red Word Triage >> Dec 10, 2023  2:44 PM [place] wrote: Red Word that prompted transfer to Nurse Triage: low blood pressure 104/45, fell out of bed on Monday, shoulder pain, lower back pain, cervical back pain Reason for Disposition  Diastolic BP < 50 mm Hg  MILD weakness (i.e., does not interfere with ability to work, go to school, normal activities)  (Exception: Mild weakness is a chronic symptom.)  Answer Assessment - Initial Assessment Questions 1. MECHANISM: "How did the fall happen?"     Reports fall out of bed on Monday,  2. DOMESTIC VIOLENCE AND ELDER ABUSE SCREENING: "Did you fall because  someone pushed you or tried to hurt you?" If Yes, ask: "Are you safe now?"     no 3. ONSET: "When did the fall happen?" (e.g., minutes, hours, or days ago)     Monday night 4. LOCATION: "What part of the body hit the ground?" (e.g., back, buttocks, head, hips, knees, hands, head, stomach)     Pain to shoulders, upper and lower back pain. 5. INJURY: "Did you hurt (injure) yourself when you fell?" If Yes, ask: "What did you injure? Tell me more about this?" (e.g., body area; type of injury; pain severity)"     Just feels sore 6. PAIN: "Is there any pain?" If Yes, ask: "How bad is the pain?" (e.g., Scale 1-10; or mild,  moderate, severe)   - NONE (0): No pain   - MILD (1-3): Doesn't interfere with normal activities    - MODERATE (4-7): Interferes with normal activities or awakens from sleep    - SEVERE (8-10): Excruciating pain, unable to do any normal activities      8 out of 10-has pain medication 7. SIZE: For cuts, bruises, or swelling, ask: "How large is it?" (e.g., inches or centimeters)      Right shoulder swollen and able to move both shoulders 9. OTHER SYMPTOMS: "Do you have any other symptoms?" (e.g., dizziness, fever, weakness; new onset or worsening).      dizziness 10. CAUSE: "What do you think caused the fall (or falling)?" (e.g., tripped, dizzy spell)       Patient reports she was trying to reposition herself and then rolled off the bed  Answer Assessment -  Initial Assessment Questions 1. BLOOD PRESSURE: "What is the blood pressure?" "Did you take at least two measurements 5 minutes apart?"     104/45 with PT, BP this morning 123/43  2. ONSET: "When did you take your blood pressure?"     Prior to PT appointment 3. HOW: "How did you obtain the blood pressure?" (e.g., visiting nurse, automatic home BP monitor)     Visiting PT 4. HISTORY: "Do you have a history of low blood pressure?" "What is your blood pressure normally?"     yes 5. MEDICINES: "Are you taking any medications  for blood pressure?" If Yes, ask: "Have they been changed recently?"     Amlodipine , coreg , midodrine  6. PULSE RATE: "Do you know what your pulse rate is?"      80 7. OTHER SYMPTOMS: "Have you been sick recently?" "Have you had a recent injury?"     Dizziness,  Protocols used: Falls and Falling-A-AH, Blood Pressure - Low-A-AH

## 2023-12-10 NOTE — Telephone Encounter (Signed)
 Noted.

## 2023-12-11 DIAGNOSIS — N2581 Secondary hyperparathyroidism of renal origin: Secondary | ICD-10-CM | POA: Diagnosis not present

## 2023-12-11 DIAGNOSIS — N186 End stage renal disease: Secondary | ICD-10-CM | POA: Diagnosis not present

## 2023-12-11 DIAGNOSIS — Z992 Dependence on renal dialysis: Secondary | ICD-10-CM | POA: Diagnosis not present

## 2023-12-14 DIAGNOSIS — D631 Anemia in chronic kidney disease: Secondary | ICD-10-CM | POA: Diagnosis not present

## 2023-12-14 DIAGNOSIS — I132 Hypertensive heart and chronic kidney disease with heart failure and with stage 5 chronic kidney disease, or end stage renal disease: Secondary | ICD-10-CM | POA: Diagnosis not present

## 2023-12-14 DIAGNOSIS — J45909 Unspecified asthma, uncomplicated: Secondary | ICD-10-CM | POA: Diagnosis not present

## 2023-12-14 DIAGNOSIS — I5033 Acute on chronic diastolic (congestive) heart failure: Secondary | ICD-10-CM | POA: Diagnosis not present

## 2023-12-14 DIAGNOSIS — J9601 Acute respiratory failure with hypoxia: Secondary | ICD-10-CM | POA: Diagnosis not present

## 2023-12-14 DIAGNOSIS — N2581 Secondary hyperparathyroidism of renal origin: Secondary | ICD-10-CM | POA: Diagnosis not present

## 2023-12-14 DIAGNOSIS — Z992 Dependence on renal dialysis: Secondary | ICD-10-CM | POA: Diagnosis not present

## 2023-12-14 DIAGNOSIS — N186 End stage renal disease: Secondary | ICD-10-CM | POA: Diagnosis not present

## 2023-12-14 DIAGNOSIS — I480 Paroxysmal atrial fibrillation: Secondary | ICD-10-CM | POA: Diagnosis not present

## 2023-12-14 DIAGNOSIS — M1990 Unspecified osteoarthritis, unspecified site: Secondary | ICD-10-CM | POA: Diagnosis not present

## 2023-12-14 DIAGNOSIS — E1122 Type 2 diabetes mellitus with diabetic chronic kidney disease: Secondary | ICD-10-CM | POA: Diagnosis not present

## 2023-12-15 ENCOUNTER — Other Ambulatory Visit: Payer: Self-pay

## 2023-12-15 ENCOUNTER — Other Ambulatory Visit (HOSPITAL_COMMUNITY): Payer: Self-pay

## 2023-12-15 ENCOUNTER — Encounter: Payer: Self-pay | Admitting: Family Medicine

## 2023-12-15 ENCOUNTER — Ambulatory Visit: Payer: Medicare HMO | Attending: Family Medicine | Admitting: Family Medicine

## 2023-12-15 ENCOUNTER — Encounter (HOSPITAL_COMMUNITY): Payer: Self-pay

## 2023-12-15 VITALS — BP 148/64 | HR 63 | Ht 62.0 in | Wt 164.2 lb

## 2023-12-15 DIAGNOSIS — N186 End stage renal disease: Secondary | ICD-10-CM

## 2023-12-15 DIAGNOSIS — R42 Dizziness and giddiness: Secondary | ICD-10-CM

## 2023-12-15 DIAGNOSIS — G8929 Other chronic pain: Secondary | ICD-10-CM

## 2023-12-15 DIAGNOSIS — Z992 Dependence on renal dialysis: Secondary | ICD-10-CM

## 2023-12-15 DIAGNOSIS — R051 Acute cough: Secondary | ICD-10-CM

## 2023-12-15 DIAGNOSIS — E1122 Type 2 diabetes mellitus with diabetic chronic kidney disease: Secondary | ICD-10-CM

## 2023-12-15 DIAGNOSIS — M545 Low back pain, unspecified: Secondary | ICD-10-CM | POA: Diagnosis not present

## 2023-12-15 DIAGNOSIS — I132 Hypertensive heart and chronic kidney disease with heart failure and with stage 5 chronic kidney disease, or end stage renal disease: Secondary | ICD-10-CM | POA: Diagnosis not present

## 2023-12-15 DIAGNOSIS — I480 Paroxysmal atrial fibrillation: Secondary | ICD-10-CM

## 2023-12-15 DIAGNOSIS — E1149 Type 2 diabetes mellitus with other diabetic neurological complication: Secondary | ICD-10-CM

## 2023-12-15 DIAGNOSIS — M25551 Pain in right hip: Secondary | ICD-10-CM

## 2023-12-15 DIAGNOSIS — I120 Hypertensive chronic kidney disease with stage 5 chronic kidney disease or end stage renal disease: Secondary | ICD-10-CM

## 2023-12-15 DIAGNOSIS — W06XXXA Fall from bed, initial encounter: Secondary | ICD-10-CM

## 2023-12-15 DIAGNOSIS — Z7984 Long term (current) use of oral hypoglycemic drugs: Secondary | ICD-10-CM

## 2023-12-15 NOTE — Progress Notes (Signed)
 Subjective:  Patient ID: Paula [name] , female    DOB: 1946-04-18  Age: 78 y.o. MRN: [id number]  CC: Medical Management of Chronic Issues (Lower back and hip pain)     Discussed the use of AI scribe software for clinical note transcription with the patient, who gave verbal consent to proceed.  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  She experiences pain in the lower back, specifically in the right hip and lower back, which makes standing for more than two minutes difficult. The pain is localized to the hip area without radiation down the leg. She is receiving physical therapy at home and is interested in outpatient therapy. Tylenol  and a heating pad provide pain relief. Hip x-ray from 2024 revealed degenerative changes send pubic symphysis, both hips, SI joints and lower lumbar spine.  Her blood pressure occasionally drops to 108 mmHg during dialysis, but typically ranges from 140 to 155 mmHg at home. She takes carvedilol  12.5 mg twice daily and amlodipine  5 mg once daily, skipping the morning dose on dialysis days.  On 1 occasion she has had hypotensive episode at home but otherwise blood pressures have been normal.  She does have a prescription for midodrine  which she has been instructed to take on the mornings of dialysis if her blood pressure is low.  Her blood sugar levels have stabilized with Tradjenta  in the morning and glipizide  in the evening if needed. She is under the care  of an endocrinologist.  She experiences occasional shortness of breath and coughs when lying down. She uses Flonase  and takes gabapentin  daily for neuropathy.   She fell from her bed last Monday, resulting in pain on the left side of her head and right shoulder, which was swollen but has improved. No current swelling or pain with pressure. She experiences dizziness at times and uses a cane occasionally.     Past Medical History:  Diagnosis Date   Allergic rhinitis    Allergy  1/12000   Arthritis    Asthma    Brain aneurysm    Cough    Diabetes mellitus    Diastolic CHF, chronic (HCC) 10/11/2011   ESRD on hemodialysis (HCC)    MWF   GERD (gastroesophageal reflux disease)    History of colon polyps 2012   tubular adenoma    Hyperlipidemia    Hypertension    Neuropathy 10/11/2011   PONV (postoperative nausea and vomiting)    one time after lymph node surgery    Past Surgical History:  Procedure Laterality Date   AV FISTULA PLACEMENT Right 10/15/2021   Procedure: ARTERIOVENOUS (AV) FISTULA  WITH PLACEMENT OF GORE-TEX STRETCH  GRAFT (4-51mmx45cm);  Surgeon: Dannis Dy, MD;  Location: [facility] OR;  Service: Vascular;  Laterality: Right;   CATARACT EXTRACTION     right eye   IR 3D INDEPENDENT WKST  12/14/2017   IR ANGIO INTRA EXTRACRAN SEL COM CAROTID INNOMINATE BILAT MOD SED  09/15/2017   IR ANGIO INTRA EXTRACRAN SEL COM CAROTID INNOMINATE UNI L MOD SED  12/14/2017   IR  ANGIO VERTEBRAL SEL VERTEBRAL BILAT MOD SED  09/15/2017   IR RADIOLOGIST EVAL & MGMT  09/10/2017   IR RADIOLOGIST EVAL & MGMT  10/19/2017   lymphatic mass surgery     NASAL TURBINATE REDUCTION     RADIOLOGY WITH ANESTHESIA N/A 12/14/2017   Procedure: RADIOLOGY WITH ANESTHESIA EMBOLIZATION;  Surgeon: Luellen Sages, MD;  Location: [facility] OR;  Service: Radiology;  Laterality: N/A;   VIDEO BRONCHOSCOPY Bilateral 11/18/2017   Procedure: VIDEO BRONCHOSCOPY WITHOUT FLUORO;  Surgeon: Denson Flake, MD;  Location:  [facility] ENDOSCOPY;  Service: Cardiopulmonary;  Laterality: Bilateral;    Family History  Problem Relation Age of Onset   Hypertension Mother    Asthma Sister    Colon cancer Neg Hx    Allergic rhinitis Neg Hx    Eczema Neg Hx     Social History   Socioeconomic History   Marital status: Divorced    Spouse name: Not on file   Number of children: 5   Years of education: Not on file   Highest education level: Master's degree (e.g., MA, MS, MEng, MEd, MSW, MBA)  Occupational History   Occupation: Unemployed  Tobacco Use   Smoking status: Never    Passive exposure: Never   Smokeless tobacco: Never  Vaping Use   Vaping status: Never Used  Substance and Sexual Activity   Alcohol  use: No   Drug use: No   Sexual activity: Not Currently    Birth control/protection: Post-menopausal, None  Other Topics Concern   Not on file  Social History Narrative   Lives alone.  5 children with two deceased.     Social Drivers of Health   Financial Resource Strain: High Risk (09/15/2023)   Overall Financial Resource Strain (CARDIA)    Difficulty of Paying Living Expenses: Very hard  Food Insecurity: Food Insecurity Present (10/11/2023)   Hunger Vital Sign    Worried About Running Out of Food in the Last Year: Sometimes true    Ran Out of Food in the Last Year: Sometimes true  Transportation Needs: Unmet Transportation Needs (10/11/2023)   PRAPARE - Administrator, Civil Service (Medical): Yes    Lack of Transportation (Non-Medical): Yes  Physical Activity: Insufficiently Active (09/15/2023)   Exercise Vital Sign    Days of Exercise per Week: 2 days    Minutes of Exercise per Session: 10 min  Stress: No Stress Concern Present (09/15/2023)   [employer] of Occupational Health - Occupational Stress Questionnaire    Feeling of Stress : Only a little  Social Connections: Moderately Integrated (10/11/2023)   Social Connection and Isolation Panel [NHANES]    Frequency of Communication with  Friends and Family: More than three times a week    Frequency of Social Gatherings with Friends and Family: More than three times a week    Attends Religious Services: More than 4 times per year    Active Member of [employer] or Organizations: Yes    Attends Banker Meetings: More than 4 times per year    Marital Status: Widowed  Recent Concern: Social Connections - Moderately Isolated (08/17/2023)   Social Connection and Isolation Panel [NHANES]    Frequency of Communication with Friends and Family: More than three times a week    Frequency of Social Gatherings with Friends and Family: Once a week    Attends Religious Services: More than 4 times per year    Active Member of Clubs or Organizations: No    Attends  Club or Organization Meetings: Never    Marital Status: Widowed    Allergies  Allergen Reactions   Dust Mite Extract Cough   Peanut-Containing Drug Products Cough   Ace Inhibitors Cough   Insulins     Reports fainting, intolerance   Lovastatin Other (See Comments)    Generalized body pain    Outpatient Medications Prior to Visit  Medication Sig Dispense Refill   ACCU-CHEK GUIDE TEST test strip USE 1 STRIP TO CHECK GLUCOSE TWICE DAILY 100 each 0   acetaminophen  (TYLENOL ) 500 MG tablet Take 500 mg by mouth every 6 (six) hours as needed for fever, moderate pain or mild pain.     albuterol  (PROAIR  HFA) 108 (90 Base) MCG/ACT inhaler Inhale 1-2 puffs into the lungs every 6 (six) hours as needed for wheezing or shortness of breath. 6.7 g 2   albuterol  (PROVENTIL ) (2.5 MG/3ML) 0.083% nebulizer solution Take 3 mLs (2.5 mg total) by nebulization every 6 (six) hours as needed for wheezing or shortness of breath. 75 mL 1   amLODipine  (NORVASC ) 5 MG tablet Take 1 tablet (5 mg total) by mouth daily. 90 tablet 1   apixaban  (ELIQUIS ) 5 MG TABS tablet Take 1 tablet (5 mg total) by mouth 2 (two) times daily. 180 tablet 1   atorvastatin  (LIPITOR ) 80 MG tablet Take 1 tablet (80 mg total)  by mouth daily. 90 tablet 0   azelastine  (ASTELIN ) 0.1 % nasal spray Place 2 sprays into both nostrils 2 (two) times daily. Use in each nostril as directed (Patient taking differently: Place 2 sprays into both nostrils daily. Use in each nostril as directed) 30 mL 12   benzonatate  (TESSALON ) 100 MG capsule Take 1 capsule by mouth three times a day as need for cough 90 capsule 1   calcium  acetate (PHOSLO ) 667 MG capsule Take 1 capsule (667 mg total) by mouth 3 (three) times daily with meals. 270 capsule 2   carvedilol  (COREG ) 12.5 MG tablet Take 1 tablet (12.5 mg total) by mouth 2 (two) times daily with a meal. (Take once a day in the evening on hemodialysis days) 180 tablet 0   fluticasone  (FLONASE ) 50 MCG/ACT nasal spray Place 2 sprays into both nostrils daily. 16 g 12   fluticasone -salmeterol (ADVAIR ) 100-50 MCG/ACT AEPB Inhale 1 puff into the lungs 2 (two) times daily. 60 each 2   gabapentin  (NEURONTIN ) 100 MG capsule Take 1 capsule (100 mg total) by mouth 3 (three) times daily. 270 capsule 1   glipiZIDE  (GLUCOTROL  XL) 2.5 MG 24 hr tablet Take 1 tablet (2.5 mg total) by mouth daily as needed (for elevated BGL). 90 tablet 1   Homeopathic Products (EARACHE DROPS OT) Place 1 drop into both ears daily as needed (for ear aches).     isosorbide  mononitrate (IMDUR ) 60 MG 24 hr tablet Take 1 tablet (60 mg total) by mouth daily. 90 tablet 0   lidocaine -prilocaine  (EMLA ) cream Apply a small amount to dialysis access one hour prior to dialysis. Wrap with plastic wrap. 30 g PRN   linagliptin  (TRADJENTA ) 5 MG TABS tablet Take 1 tablet (5 mg total) by mouth daily. 90 tablet 3   loratadine  (CLARITIN ) 10 MG tablet Take 1 tablet (10 mg total) by mouth daily. 90 tablet 1   midodrine  (PROAMATINE ) 10 MG tablet Take 1 tablet (10 mg total) by mouth 3 (three) times a week. Take 30 minutes prior to HD (Patient taking differently: Take 10 mg by mouth See admin instructions. Take 10 mg by  mouth prior to dialysis only if  blood pressure is low) 15 tablet 3   Misc. Devices [employer]  Diagnosis - unstable gait 1 each 0   Misc. Devices MISC Fistula sleeve for R arm.  Diagnoses: End-stage renal disease on hemodialysis 1 each 0   Misc. Devices MISC 1. Incontinence supplies.  2. Briefs.  Diagnosis-urinary incontinence 1 each 11   Misc. Devices MISC Nepro dialysis protein drink.  Diagnosis end-stage renal disease (Patient taking differently: Take 10 g by mouth See admin instructions. Nepro dialysis protein drink (Diagnosis end-stage renal disease)- Mix 10 grams of powder and mix into food daily as needed for low protein) 30 each 11   Misc. Devices MISC Blood pressure monitor.  Diagnosis hypertension 1 each 0   montelukast  (SINGULAIR ) 10 MG tablet Take 1 tablet (10 mg total) by mouth at bedtime. 90 tablet 1   olopatadine  (PATANOL) 0.1 % ophthalmic solution Place 1 drop into both eyes 2 (two) times daily. (Patient taking differently: Place 1 drop into both eyes daily.) 5 mL 12   ondansetron  (ZOFRAN ) 4 MG tablet Take 4 mg by mouth every 8 (eight) hours as needed for nausea or vomiting.     Throat Lozenges (HALLS COUGH DROPS MT) Use as directed 1 lozenge in the mouth or throat every 2 (two) hours as needed (for coughing).     No facility-administered medications prior to visit.     ROS Review of Systems  Constitutional:  Negative for activity change and appetite change.  HENT:  Negative for sinus pressure and sore throat.   Respiratory:  Negative for chest tightness, shortness of breath and wheezing.   Cardiovascular:  Negative for chest pain and palpitations.  Gastrointestinal:  Negative for abdominal distention, abdominal pain and constipation.  Genitourinary: Negative.   Musculoskeletal:        See HPI  Psychiatric/Behavioral:  Negative for behavioral problems and dysphoric mood.     Objective:  BP (!) 148/64   Pulse 63   Ht 5\' 2"  (1.575 m)   Wt 164 lb 3.2 oz (74.5 kg)   LMP  (LMP Unknown)   SpO2 (!) 88%  Comment: Patient declined Oxygen  BMI 30.03 kg/m      12/15/2023    4:13 PM 10/29/2023   10:48 AM 10/13/2023    9:23 AM  BP/Weight  Systolic BP 148 164 152  Diastolic BP 64 71 83  Wt. (Lbs) 164.2 161   BMI 30.03 kg/m2 29.45 kg/m2       Physical Exam Constitutional:      Appearance: She is well-developed.  Cardiovascular:     Rate and Rhythm: Normal rate.     Heart sounds: Normal heart sounds. No murmur heard. Pulmonary:     Effort: Pulmonary effort is normal.     Breath sounds: Normal breath sounds. No wheezing or rales.  Chest:     Chest wall: No tenderness.  Abdominal:     General: Bowel sounds are normal. There is no distension.     Palpations: Abdomen is soft. There is no mass.     Tenderness: There is no abdominal tenderness.  Musculoskeletal:        General: Normal range of motion.     Right lower leg: No edema.     Left lower leg: No edema.     Comments: No tenderness on palpation of right shoulder or on range of motion. No tenderness on palpation of lumbar spine, paraspinal muscles, range of motion of the hip  Neurological:     Mental Status: She is alert and oriented to person, place, and time.  Psychiatric:        Mood and Affect: Mood normal.        Latest Ref Rng & Units 10/13/2023    5:11 AM 10/12/2023    4:25 AM 10/11/2023   11:56 PM  CMP  Glucose 70 - 99 mg/dL 696  295    BUN 8 - 23 mg/dL 31  60    Creatinine 2.84 - 1.00 mg/dL 1.32  4.40    Sodium 102 - 145 mmol/L 132  132  132   Potassium 3.5 - 5.1 mmol/L 3.3  3.6  3.6   Chloride 98 - 111 mmol/L 93  92    CO2 22 - 32 mmol/L 28  28    Calcium  8.9 - 10.3 mg/dL 8.5  8.6    Total Protein 6.5 - 8.1 g/dL 6.9  6.2    Total Bilirubin 0.0 - 1.2 mg/dL 0.5  0.9    Alkaline Phos 38 - 126 U/L 90  87    AST 15 - 41 U/L 19  11    ALT 0 - 44 U/L 17  14      Lipid Panel     Component Value Date/Time   CHOL 106 09/01/2021 1041   CHOL 121 06/17/2021 0904   TRIG 61 09/01/2021 1041   HDL 46 09/01/2021 1041    HDL 46 06/17/2021 0904   CHOLHDL 2.3 09/01/2021 1041   VLDL 12 09/01/2021 1041   LDLCALC 48 09/01/2021 1041   LDLCALC 61 06/17/2021 0904    CBC    Component Value Date/Time   WBC 6.8 10/13/2023 0511   RBC 3.38 (L) 10/13/2023 0511   HGB 10.3 (L) 10/13/2023 0511   HGB 12.4 09/02/2018 1211   HCT 29.9 (L) 10/13/2023 0511   HCT 37.3 09/02/2018 1211   PLT 154 10/13/2023 0511   PLT 203 09/02/2018 1211   MCV 88.5 10/13/2023 0511   MCV 87 09/02/2018 1211   MCH 30.5 10/13/2023 0511   MCHC 34.4 10/13/2023 0511   RDW 17.8 (H) 10/13/2023 0511   RDW 13.1 09/02/2018 1211   LYMPHSABS 1.1 10/11/2023 1530   LYMPHSABS 1.2 09/02/2018 1211   MONOABS 0.7 10/11/2023 1530   EOSABS 0.4 10/11/2023 1530   EOSABS 0.1 09/02/2018 1211   BASOSABS 0.0 10/11/2023 1530   BASOSABS 0.0 09/02/2018 1211    Lab Results  Component Value Date   HGBA1C 7.4 (H) 08/17/2023       Assessment & Plan Right hip and lower back pain Chronic localized pain exacerbated by standing. Current home physical therapy inadequate. -Likely osteoarthritis - Order x-ray of the right hip and lower back. - Refer to outpatient physical therapy. - Continue Tylenol  which has been effective  Dizziness Intermittent dizziness linked to hypotension. Blood pressure fluctuates, with drops during dialysis. Midodrine  available for hypotension management. - Monitor blood pressure closely. - Administer midodrine  as needed for hypotension before dialysis. - If hypotension continues we will consider decreasing amlodipine  from 5 mg to 2.5 mg.  Fall Recent fall due to coughing and positioning, causing muscle soreness. Pain in left side of head and shoulder improving. - Fall precaution  Type 2 diabetes mellitus A1c of 7.4 from 08/2023 - Anticipate that level is lower due to ESRD Blood glucose well-managed with linagliptin . Glipizide  used as needed for evening hyperglycemia. Under endocrinologist care. - Continue linagliptin  (Tradjenta ) in  the morning. - Use glipizide   in the evening if needed per endocrine instructions   Hypertension and end-stage renal disease - Slightly elevated blood pressure-will allow for permissive hypertension due to tendency to become hypotensive during hemodialysis sessions - Continue current regimen      No orders of the defined types were placed in this encounter.   Follow-up: Return in about 6 months (around 06/16/2024) for Chronic medical conditions.       Joaquin Mulberry, MD, FAAFP. [facility] [place], [place] [phone]   12/15/2023, 4:53 PM

## 2023-12-15 NOTE — Patient Instructions (Signed)
 VISIT SUMMARY:  Today, we discussed your right hip and lower back pain, dizziness, recent fall, and diabetes management. We reviewed your current medications and made some adjustments to your treatment plan to help manage your symptoms more effectively.  YOUR PLAN:  -RIGHT HIP AND LOWER BACK PAIN: You have chronic pain in your right hip and lower back that gets worse when you stand. We will order an x-ray to get a better look at your hip and lower back, and refer you to outpatient physical therapy to help manage the pain.  -DIZZINESS: You experience occasional dizziness, which is related to low blood pressure, especially during dialysis. We will monitor your blood pressure closely and you should take midodrine  as needed to manage low blood pressure before dialysis.  -FALL: You recently fell and hurt the left side of your head and shoulder, but the pain and swelling are improving. No further treatment is needed at this time.  -TYPE 2 DIABETES MELLITUS: Your blood sugar levels are well-managed with your current medications. Continue taking linagliptin  (Tradjenta ) in the morning and use glipizide  in the evening if needed to control your blood sugar levels.  INSTRUCTIONS:  Please follow up with the x-ray of your right hip and lower back as soon as possible. We will also arrange for you to start outpatient physical therapy. Monitor your blood pressure regularly, especially on dialysis days, and take midodrine  as needed. Continue with your current diabetes medications and follow up with your endocrinologist as scheduled.

## 2023-12-16 DIAGNOSIS — Z992 Dependence on renal dialysis: Secondary | ICD-10-CM | POA: Diagnosis not present

## 2023-12-16 DIAGNOSIS — N2581 Secondary hyperparathyroidism of renal origin: Secondary | ICD-10-CM | POA: Diagnosis not present

## 2023-12-16 DIAGNOSIS — N186 End stage renal disease: Secondary | ICD-10-CM | POA: Diagnosis not present

## 2023-12-17 ENCOUNTER — Telehealth: Payer: Self-pay

## 2023-12-17 DIAGNOSIS — J9601 Acute respiratory failure with hypoxia: Secondary | ICD-10-CM | POA: Diagnosis not present

## 2023-12-17 DIAGNOSIS — N186 End stage renal disease: Secondary | ICD-10-CM | POA: Diagnosis not present

## 2023-12-17 DIAGNOSIS — M1990 Unspecified osteoarthritis, unspecified site: Secondary | ICD-10-CM | POA: Diagnosis not present

## 2023-12-17 DIAGNOSIS — I132 Hypertensive heart and chronic kidney disease with heart failure and with stage 5 chronic kidney disease, or end stage renal disease: Secondary | ICD-10-CM | POA: Diagnosis not present

## 2023-12-17 DIAGNOSIS — I5033 Acute on chronic diastolic (congestive) heart failure: Secondary | ICD-10-CM | POA: Diagnosis not present

## 2023-12-17 DIAGNOSIS — E1122 Type 2 diabetes mellitus with diabetic chronic kidney disease: Secondary | ICD-10-CM | POA: Diagnosis not present

## 2023-12-17 DIAGNOSIS — D631 Anemia in chronic kidney disease: Secondary | ICD-10-CM | POA: Diagnosis not present

## 2023-12-17 DIAGNOSIS — J45909 Unspecified asthma, uncomplicated: Secondary | ICD-10-CM | POA: Diagnosis not present

## 2023-12-17 DIAGNOSIS — I480 Paroxysmal atrial fibrillation: Secondary | ICD-10-CM | POA: Diagnosis not present

## 2023-12-17 NOTE — Telephone Encounter (Signed)
Verbal orders given for patient.

## 2023-12-17 NOTE — Telephone Encounter (Signed)
 Copied from CRM [id number]. Topic: Clinical - Home Health Verbal Orders >> Dec 17, 2023 12:36 PM Felizardo Hotter wrote: Caller/Agency: [facility] per Alix Aquas Number: [phone] secure line Service Requested: change of frequency  Frequency: using out pt therapy Any new concerns about the patient? No

## 2023-12-18 DIAGNOSIS — N186 End stage renal disease: Secondary | ICD-10-CM | POA: Diagnosis not present

## 2023-12-18 DIAGNOSIS — N2581 Secondary hyperparathyroidism of renal origin: Secondary | ICD-10-CM | POA: Diagnosis not present

## 2023-12-18 DIAGNOSIS — Z992 Dependence on renal dialysis: Secondary | ICD-10-CM | POA: Diagnosis not present

## 2023-12-21 DIAGNOSIS — N2581 Secondary hyperparathyroidism of renal origin: Secondary | ICD-10-CM | POA: Diagnosis not present

## 2023-12-21 DIAGNOSIS — N186 End stage renal disease: Secondary | ICD-10-CM | POA: Diagnosis not present

## 2023-12-21 DIAGNOSIS — Z992 Dependence on renal dialysis: Secondary | ICD-10-CM | POA: Diagnosis not present

## 2023-12-23 DIAGNOSIS — Z992 Dependence on renal dialysis: Secondary | ICD-10-CM | POA: Diagnosis not present

## 2023-12-23 DIAGNOSIS — N2581 Secondary hyperparathyroidism of renal origin: Secondary | ICD-10-CM | POA: Diagnosis not present

## 2023-12-23 DIAGNOSIS — N186 End stage renal disease: Secondary | ICD-10-CM | POA: Diagnosis not present

## 2023-12-23 NOTE — Therapy (Signed)
 OUTPATIENT PHYSICAL THERAPY THORACOLUMBAR EVALUATION   Patient Name: Paula [name]  MRN: [id number] DOB:17-Jan-1946, 78 y.o., female Today's Date: 12/24/2023  END OF SESSION:  PT End of Session - 12/24/23 1220     Visit Number 1    Date for PT Re-Evaluation 03/17/24    Authorization Type [employer]    PT Start Time 1230    PT Stop Time 1315    PT Time Calculation (min) 45 min             Past Medical History:  Diagnosis Date   Allergic rhinitis    Allergy  1/12000   Arthritis    Asthma    Brain aneurysm    Cough    Diabetes mellitus    Diastolic CHF, chronic (HCC) 10/11/2011   ESRD on hemodialysis (HCC)    MWF   GERD (gastroesophageal reflux disease)    History of colon polyps 2012   tubular adenoma    Hyperlipidemia    Hypertension    Neuropathy 10/11/2011   PONV (postoperative nausea and vomiting)    one time after lymph node surgery   Past Surgical History:  Procedure Laterality Date   AV FISTULA PLACEMENT Right 10/15/2021   Procedure: ARTERIOVENOUS (AV) FISTULA  WITH PLACEMENT OF GORE-TEX STRETCH  GRAFT (4-52mmx45cm);  Surgeon: Dannis Dy, MD;  Location: [facility] OR;  Service: Vascular;  Laterality: Right;   CATARACT EXTRACTION     right eye   IR 3D INDEPENDENT WKST  12/14/2017   IR ANGIO INTRA EXTRACRAN SEL COM CAROTID INNOMINATE BILAT MOD SED  09/15/2017   IR ANGIO INTRA EXTRACRAN SEL COM CAROTID INNOMINATE UNI L MOD SED  12/14/2017   IR ANGIO VERTEBRAL SEL VERTEBRAL BILAT MOD SED  09/15/2017   IR RADIOLOGIST EVAL & MGMT  09/10/2017   IR RADIOLOGIST EVAL & MGMT  10/19/2017   lymphatic mass surgery     NASAL TURBINATE REDUCTION     RADIOLOGY WITH ANESTHESIA N/A 12/14/2017   Procedure: RADIOLOGY WITH ANESTHESIA EMBOLIZATION;  Surgeon: Luellen Sages, MD;  Location: [facility] OR;  Service: Radiology;  Laterality: N/A;   VIDEO BRONCHOSCOPY Bilateral 11/18/2017   Procedure: VIDEO BRONCHOSCOPY WITHOUT FLUORO;  Surgeon: Denson Flake, MD;  Location: [facility]  ENDOSCOPY;  Service: Cardiopulmonary;  Laterality: Bilateral;   Patient Active Problem List   Diagnosis Date Noted   CHF (congestive heart failure) (HCC) 10/12/2023   Blurry vision 10/11/2023   Anemia due to chronic kidney disease 10/11/2023   Acute respiratory failure with hypoxia (HCC) 08/17/2023   PNA (pneumonia) 08/17/2023   Volume overload 08/17/2023   Acute hypoxic respiratory failure (HCC) 08/17/2023   SNHL (sensory-neural hearing loss), 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   ESRD on dialysis (HCC) 11/20/2022   Mold exposure 08/14/2022   Opioid withdrawal (HCC) 08/14/2022   ILD (interstitial lung disease) (HCC) 08/14/2022   Secondary hyperparathyroidism, renal (HCC) 08/14/2022   Paroxysmal A-fib (HCC) 05/20/2022   Hypertrophy of nasal turbinates 09/05/2021   Chronic kidney disease, stage 5 (HCC)    Aortic atherosclerosis (HCC) 06/24/2021   Chronic rhinitis 10/01/2017   Seasonal allergic conjunctivitis 10/01/2017   Aneurysm (HCC) 09/24/2017   Acute metabolic encephalopathy 09/02/2017   Allergic rhinitis 08/20/2017   Palpitations 07/10/2016   Gout 01/23/2016   History of cataract 02/27/2015   Seasonal and perennial allergic rhinitis 02/15/2012   Chronic diastolic CHF (congestive heart failure) (HCC) 10/11/2011   GERD (gastroesophageal reflux disease)    Type 2 diabetes mellitus without complication, without long-term current use of insulin  (HCC) 07/03/2010  Hyperlipidemia 07/03/2010   Essential hypertension 07/03/2010    PCP: Joaquin Mulberry  REFERRING PROVIDER: Joaquin Mulberry  REFERRING DIAG: M54.50,G89.29 (ICD-10-CM) - Chronic right-sided low back pain without sciaticaM25.551 (ICD-10-CM) - Right hip pain  Rationale for Evaluation and Treatment: Rehabilitation  THERAPY DIAG:  Muscle weakness (generalized)  Difficulty in walking, not elsewhere classified  Other low back pain  Other abnormalities of gait and mobility  Balance problem  Pain in right hip  ONSET DATE:  10/11/23  SUBJECTIVE:                                                                                                                                                                                           SUBJECTIVE STATEMENT: I have problems with my balance and my hip and back are hurting. I can only stand for about 2-5 mins. I was in the hospital because I had a fall. I was there for 2 days. Was doing home health at home up until last Thursday 12/17/23.   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   She experiences pain in the lower back, specifically in the right hip and lower back, which makes standing for more than two minutes difficult. The pain is localized to the hip area without radiation down the leg. She is receiving physical therapy at home and is interested in outpatient therapy. Tylenol  and a heating pad provide pain relief. Hip x-ray from 2024 revealed degenerative changes send pubic symphysis, both hips, SI joints and lower lumbar spine.  She fell from her bed last Monday, resulting in pain on the left side of her head and right shoulder, which was swollen but has improved. No current swelling or pain with pressure. She experiences dizziness at times and uses a cane occasionally.    PAIN:  Are you having pain? Yes: NPRS scale: 10/10 Pain location: R hip and midline low back  Pain description: not there all the time, if I am sitting I am okay Aggravating factors: when I get up, when I am  standing, walking  Relieving factors: Tylenol  and gabapentin   PRECAUTIONS: Fall  RED FLAGS: None   WEIGHT BEARING RESTRICTIONS: No  FALLS:  Has patient fallen in last  6 months? Yes. Number of falls 2 knee gave out in March, then 1 time fell out of the bed   LIVING ENVIRONMENT: Lives with: lives with their family Lives in: House/apartment Stairs: Yes: Internal: 14 steps; on right going up and the wall on the other side Has following equipment at home: Single point cane and Walker - 2 wheeled  OCCUPATION: N/A  PLOF: Independent with basic ADLs  PATIENT GOALS: to get rid of the pain, be able to walk and stand for longer than 2 mins without the pain  NEXT MD VISIT: 03/01/24  OBJECTIVE:  Note: Objective measures were completed at Evaluation unless otherwise noted.  DIAGNOSTIC FINDINGS:  X ray ordered for hips, lumbar, and knees   COGNITION: Overall cognitive status: Within functional limits for tasks assessed     SENSATION: WFL  POSTURE: rounded shoulders, forward head, and increased thoracic kyphosis  PALPATION: TTP and pain lumbar spine   LUMBAR ROM:   AROM eval  Flexion Distal shin with pain  Extension 25% available  Right lateral flexion Mid thigh with pain  Left lateral flexion Mid thigh with pain  Right rotation 75% available with pain  Left rotation 75% available with pain   (Blank rows = not tested)  LOWER EXTREMITY ROM:   grossly WFL   LOWER EXTREMITY MMT:  grossly 4-/5 BLE    FUNCTIONAL TESTS:  5 times sit to stand: 43s from elevated mat and needing UE push off, loses balance on to heels  Timed up and go (TUG): 24s   GAIT: Distance walked: in clinic distances Assistive device utilized: None Level of assistance: CGA Comments: furniture walking, or reaches out to grab daughter's hand to walk  TREATMENT DATE: 12/24/23- EVAL, HEP                                                                                                                                  PATIENT EDUCATION:  Education details: POC, HEP, importance of movement Person educated: Patient and Child(ren) Education method: Explanation and Demonstration Education comprehension: verbalized understanding  HOME EXERCISE PROGRAM: Access Code: [email or web address] URL: [email or web address] Date: 12/24/2023 Prepared by: Donavon Fudge  Exercises - Supine Lower Trunk Rotation  - 1 x daily - 7 x weekly - 2 sets - 10 reps - Seated March  - 1 x daily - 7 x weekly - 2 sets - 10 reps - Seated Hip Abduction with Resistance  - 1 x daily - 7 x weekly - 2 sets - 10 reps - Sit to Stand  - 1 x daily - 7 x weekly - 2 sets - 5 reps  ASSESSMENT:  CLINICAL IMPRESSION: Patient is a 78 y.o. female who was seen today for physical therapy evaluation and treatment for balance, gait, low back and hip pain. She is ambulating without a device but is very unsteady and shuffles her feet. She was advised to use the [facility] at the least. Patient has  a chronic cough that is pretty bad. Her daughter reports she is not complaint with HEP and spends most of the day sitting on the sofa. She presents with LE weakness and balance deficits that put her at a high fall risk. She states that her back and hip pain prevent her from doing anything. Patient is very wobbly when going from sit to stand especially after prolonged sitting. She will benefit from skilled PT to address her gait and balance impairments and decrease her hip and back pain to be more mobile.   OBJECTIVE IMPAIRMENTS: Abnormal gait, cardiopulmonary status limiting activity, decreased activity tolerance, decreased balance, decreased coordination, decreased endurance, decreased mobility, difficulty walking, decreased ROM, decreased strength, postural dysfunction, and pain.   ACTIVITY LIMITATIONS: lifting, bending, sitting, standing, squatting, transfers, and locomotion level  PARTICIPATION LIMITATIONS: meal prep, cleaning, laundry, shopping, and  community activity  PERSONAL FACTORS: Age, Behavior pattern, Past/current experiences, Time since onset of injury/illness/exacerbation, and 3+ comorbidities: CHF, ESRD on dialysis, neuropathy, chronic cough are also affecting patient's functional outcome.   REHAB POTENTIAL: Fair    CLINICAL DECISION MAKING: Stable/uncomplicated  EVALUATION COMPLEXITY: Low   GOALS: Goals reviewed with patient? Yes  SHORT TERM GOALS: Target date: 02/04/24  Patient will be independent with initial HEP. Baseline: given 12/24/23 Goal status: INITIAL  2.  Patient will demonstrate decreased fall risk by scoring < 15 sec on TUG. Baseline: 24s Goal status: INITIAL   LONG TERM GOALS: Target date: 03/17/24  Patient will be independent with advanced/ongoing HEP to improve outcomes and carryover.  Baseline:  Goal status: INITIAL  2.  Patient will be able to ambulate 500' with good safety to access community.  Baseline: not using AD, very unsteady and unable to walk or stand more than 2 mins Goal status: INITIAL  3.  Patient will report 50-75% improvement in R hip and low back pain Baseline: 10/10  Goal status: INITIAL   4.  Patient will demonstrate improved functional LE strength as demonstrated by 5xSTS <20s Baseline: 43s elevated mat and UE push off Goal status: INITIAL  5.  Patient will increase standing and walking tolerance to 10 mins  Baseline: 2-5 mins  Goal status: INITIAL   PLAN:  PT FREQUENCY: 2x/week  PT DURATION: 12 weeks  PLANNED INTERVENTIONS: 97110-Therapeutic exercises, 97530- Therapeutic activity, 97112- Neuromuscular re-education, 97535- Self Care, 78469- Manual therapy, [phone]- Gait training, [phone]- Electrical stimulation (unattended), Patient/Family education, Balance training, Stair training, Dry Needling, Joint mobilization, Spinal mobilization, Cryotherapy, and Moist heat.  PLAN FOR NEXT SESSION: LE strengthening, balance training, low back and hip stretches just trying to  get her moving    Donavon Fudge, PT 12/24/2023, 1:17 PM

## 2023-12-24 ENCOUNTER — Ambulatory Visit: Attending: Family Medicine

## 2023-12-24 DIAGNOSIS — M5459 Other low back pain: Secondary | ICD-10-CM | POA: Diagnosis not present

## 2023-12-24 DIAGNOSIS — R2689 Other abnormalities of gait and mobility: Secondary | ICD-10-CM | POA: Diagnosis not present

## 2023-12-24 DIAGNOSIS — M25551 Pain in right hip: Secondary | ICD-10-CM | POA: Diagnosis not present

## 2023-12-24 DIAGNOSIS — G8929 Other chronic pain: Secondary | ICD-10-CM | POA: Diagnosis not present

## 2023-12-24 DIAGNOSIS — R262 Difficulty in walking, not elsewhere classified: Secondary | ICD-10-CM | POA: Diagnosis not present

## 2023-12-24 DIAGNOSIS — M545 Low back pain, unspecified: Secondary | ICD-10-CM | POA: Insufficient documentation

## 2023-12-24 DIAGNOSIS — M6281 Muscle weakness (generalized): Secondary | ICD-10-CM | POA: Insufficient documentation

## 2023-12-25 DIAGNOSIS — N186 End stage renal disease: Secondary | ICD-10-CM | POA: Diagnosis not present

## 2023-12-25 DIAGNOSIS — Z992 Dependence on renal dialysis: Secondary | ICD-10-CM | POA: Diagnosis not present

## 2023-12-25 DIAGNOSIS — N2581 Secondary hyperparathyroidism of renal origin: Secondary | ICD-10-CM | POA: Diagnosis not present

## 2023-12-28 DIAGNOSIS — N2581 Secondary hyperparathyroidism of renal origin: Secondary | ICD-10-CM | POA: Diagnosis not present

## 2023-12-28 DIAGNOSIS — N186 End stage renal disease: Secondary | ICD-10-CM | POA: Diagnosis not present

## 2023-12-28 DIAGNOSIS — Z992 Dependence on renal dialysis: Secondary | ICD-10-CM | POA: Diagnosis not present

## 2023-12-28 NOTE — Progress Notes (Signed)
 Cardiology Office Note:   Date:  12/29/2023  ID:  Paula [name] , DOB [birth year], MRN [id number] PCP: Joaquin Mulberry, MD   HeartCare Providers Cardiologist:  Eilleen Grates, MD {  History of Present Illness:   Paula [name]  is a 78 y.o. 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 Alvira Josephs,  hypertension, hyperlipidemia, type 2 diabetes mellitus, ESRD on hemodialysis T/Th/Sat, and GERD.  She presents for follow up.  She has a long history of palpitations.  She has been found in the past to have atrial fibrillation.  She has a history of heart failure preserved ejection fraction with a Myoview  in 2022 showing no evidence of any ischemia or infarct.  PYP scan was to be ordered but it was never done.  Her last echo in Jan 2025 demonstrated an EF of 60 to 65% with no significant valvular abnormalities.  She does have severe concentric left ventricular urgency.  She was hospitalized in January with community-acquired pneumonia and respiratory failure.  She was hospitalized most recently in March of this year and I reviewed these records.  She had respiratory failure.  She was sent home with with oxygen which she uses as needed.  She was seen by pulmonary.  I did review pulmonary records.  She is reported to have something called upper airway cough syndrome.  She does not follow with pulmonary anymore.  She is not describing new PND or orthopnea but she does dyspneic with exertion.  She says they record her oxygen levels and they seem to be all over the place but today when we walked her around the office her saturation was 94 and fell to 88 with ambulation.  She is also  describing significant fatigue.  She was anemic during last hospitalization but this is her baseline.  She has not had a TSH in a while.  She does not miss her dialysis.  She does not sleep well at night.  ROS: As stated in the HPI and negative for all other systems.  Studies Reviewed:    EKG:   Sinus rhythm, rate 69, axis within normal limits, first-degree AV block, no acute ST-T wave changes.  Risk Assessment/Calculations:    CHA2DS2-VASc Score = 5   This indicates a 7.2% annual risk of stroke. The patient's score is based upon: CHF History: 0 HTN History: 1 Diabetes History: 1 Stroke History: 0 Vascular Disease History: 0 Age Score: 2 Gender Score: 1   Physical Exam:   VS:  BP (!) 128/58 (BP Location: Right Arm, Patient Position: Sitting, Cuff Size: Normal)   Pulse 69   Ht 5\' 3"  (1.6 m)   Wt 163 lb (73.9 kg)   LMP  (LMP Unknown)   SpO2 93%   BMI 28.87 kg/m    Wt Readings from Last 3 Encounters:  12/29/23 163 lb (73.9 kg)  12/15/23 164 lb 3.2 oz (74.5 kg)  10/29/23 161 lb (73 kg)     GEN: Well nourished, well developed in no acute distress NECK: No JVD; No carotid bruits CARDIAC: RRR, 2 out of 6 brief apical systolic murmur nonradiating, no diastolic murmurs, rubs, gallops RESPIRATORY:  Clear to auscultation without rales, wheezing or rhonchi  ABDOMEN: Soft, non-tender, non-distended EXTREMITIES:  No edema; No  deformity   ASSESSMENT AND PLAN:   Coronary Artery Calcification:   The patient had a negative perfusion study in 2022.  She has had no chest discomfort or symptoms consistent with new onset angina.  She has a history of coronary artery calcifications noted on prior chest CTs. Myoview  in 07/2021 was negative for ischemia.  Previously during hospitalization for respiratory failure she was not thought to have acute coronary syndromes and had nondiagnostic enzymes.  At this point my suspicion that her dyspnea represents an acute coronary syndrome is low.  No further  cardiac testing.   Chronic HFpEF: She has had severe LVH.  She seems to be euvolemic and her volume is managed with dialysis.  It does not look like she ever had the PYP scan that was ordered and I will follow-up with this.   Paroxysmal atrial fibrillation: She has had no symptomatic paroxysms and tolerates anticoagulation.  No change in therapy.  Hypertension: Her blood pressure is at target.  No change in therapy.  Type 2 Diabetes: Hemoglobin A1c was 7.4 in January.  No change in therapy.   ESRD on Hemodialysis:   Per nephrology.  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  She has been followed by Dr. Alvira Josephs.   Fatigue: I will check a TSH.  Hypoxemia: She did desat walking around and has home O2.  She cannot carry the canisters with her.  We are going to look into trying to get her concentrator but I do also like for her to go back and see pulmonary.    Follow up with me in one year.   Signed, Eilleen Grates, MD

## 2023-12-29 ENCOUNTER — Encounter: Payer: Self-pay | Admitting: Cardiology

## 2023-12-29 ENCOUNTER — Ambulatory Visit: Attending: Cardiology | Admitting: Cardiology

## 2023-12-29 ENCOUNTER — Telehealth: Payer: Self-pay | Admitting: Licensed Clinical Social Worker

## 2023-12-29 ENCOUNTER — Ambulatory Visit
Admission: RE | Admit: 2023-12-29 | Discharge: 2023-12-29 | Disposition: A | Discharge status: Home / Self Care | Source: Ambulatory Visit | Attending: Family Medicine | Admitting: Family Medicine

## 2023-12-29 ENCOUNTER — Ambulatory Visit: Payer: Self-pay | Admitting: Family Medicine

## 2023-12-29 VITALS — BP 128/58 | HR 69 | Ht 63.0 in | Wt 163.0 lb

## 2023-12-29 DIAGNOSIS — M4316 Spondylolisthesis, lumbar region: Secondary | ICD-10-CM | POA: Diagnosis not present

## 2023-12-29 DIAGNOSIS — M25551 Pain in right hip: Secondary | ICD-10-CM

## 2023-12-29 DIAGNOSIS — I10 Essential (primary) hypertension: Secondary | ICD-10-CM | POA: Diagnosis not present

## 2023-12-29 DIAGNOSIS — E785 Hyperlipidemia, unspecified: Secondary | ICD-10-CM

## 2023-12-29 DIAGNOSIS — M47816 Spondylosis without myelopathy or radiculopathy, lumbar region: Secondary | ICD-10-CM | POA: Diagnosis not present

## 2023-12-29 DIAGNOSIS — I480 Paroxysmal atrial fibrillation: Secondary | ICD-10-CM

## 2023-12-29 DIAGNOSIS — M545 Low back pain, unspecified: Secondary | ICD-10-CM

## 2023-12-29 DIAGNOSIS — R0902 Hypoxemia: Secondary | ICD-10-CM | POA: Insufficient documentation

## 2023-12-29 DIAGNOSIS — R9431 Abnormal electrocardiogram [ECG] [EKG]: Secondary | ICD-10-CM | POA: Insufficient documentation

## 2023-12-29 DIAGNOSIS — M170 Bilateral primary osteoarthritis of knee: Secondary | ICD-10-CM | POA: Diagnosis not present

## 2023-12-29 DIAGNOSIS — M1611 Unilateral primary osteoarthritis, right hip: Secondary | ICD-10-CM | POA: Diagnosis not present

## 2023-12-29 DIAGNOSIS — I517 Cardiomegaly: Secondary | ICD-10-CM

## 2023-12-29 DIAGNOSIS — E118 Type 2 diabetes mellitus with unspecified complications: Secondary | ICD-10-CM | POA: Diagnosis not present

## 2023-12-29 DIAGNOSIS — G8929 Other chronic pain: Secondary | ICD-10-CM

## 2023-12-29 DIAGNOSIS — I5032 Chronic diastolic (congestive) heart failure: Secondary | ICD-10-CM

## 2023-12-29 HISTORY — DX: Cardiomegaly: I51.7

## 2023-12-29 HISTORY — DX: Abnormal electrocardiogram (ECG) (EKG): R94.31

## 2023-12-29 HISTORY — DX: Hypoxemia: R09.02

## 2023-12-29 NOTE — Telephone Encounter (Signed)
 Spoke with patient daughter Adra Hope patient's daughter. Patient and  Ndeya is requesting portable oxygen concentrator. Advised that patient was seen last on 05/06/ 2025 and at this time there is no need for an office visit. Advised that I would send request to provider.

## 2023-12-29 NOTE — Patient Instructions (Signed)
 Labs: TSH today  Testing/Procedures: PYP scan  **Message sent to social work to assist in oxygen concentrator** **Needs to see pulmonology (message sent to pulmonologist)**  Follow-Up: At [facility], you and your health needs are our priority.  As part of our continuing mission to provide you with exceptional heart care, our providers are all part of one team.  This team includes your primary Cardiologist (physician) and Advanced Practice Providers or APPs (Physician Assistants and Nurse Practitioners) who all work together to provide you with the care you need, when you need it.  Your next appointment:   1 year(s)  Provider:   One of our Advanced Practice Providers (APPs): Melita Springer, PA-C  Friddie Jetty, NP Evaline Hill, NP  Theotis Flake, PA-C Lawana Pray, NP  Willis Harter, PA-C Lovette Rud, PA-C  [place], [place] Charles Connor, NP  Marlana Silvan, NP Marcie Sever, PA-C  Laquita Plant, PA-C    Dayna Dunn, PA-C  Marlyse Single, PA-C Palmer Bobo, NP Katlyn West, NP Callie Goodrich, PA-C  Evan Williams, PA-C Sheng Haley, PA-C  Xika Zhao, NP Kathleen Johnson, PA-C

## 2023-12-29 NOTE — Telephone Encounter (Signed)
 Patient called as she is at the cardiologist office and her O2 saturation was 83%. Patient uses oxygen at home on 2 L. Pt does not have a portable oxygen tank and feels she needs one. O2 saturation at home is usually 83%-92% per patient. Pt states she has mild difficulty breathing which is worsening. The call dropped and this RN tried to call pt back. This RN reached pt voicemail which states it has not been set up yet. Routing to call back.   Copied from CRM [id number]. Topic: Clinical - Red Word Triage >> Dec 29, 2023  2:45 PM Juliana Ocean wrote: Red Word that prompted transfer to Nurse Triage: low O2 levels, 83 %, she is at [facility] and the dr told her to call.  Only Tues/thurs due to dialysis. Reason for Disposition . [1] MILD difficulty breathing (e.g., minimal/no SOB at rest, SOB with walking, pulse <100) AND [2] new-onset  Protocols used: Oxygen Monitoring and Hypoxia-A-AH

## 2023-12-29 NOTE — Telephone Encounter (Signed)
 Chief Complaint: hypoxia Symptoms: hypoxia, SOB Frequency: SOB worsening in last 5 months, hypoxic today at cardiologist Pertinent Negatives: Patient denies CP Disposition: [x] ED /[] Urgent Care (no appt availability in office) / [] Appointment(In office/virtual)/ []  [facility]/ [] Home Care/ [x] Refused Recommended Disposition /[] [facility]/ []  Follow-up with PCP Additional Notes: RN spoke with pt's daughter. Daughter states that pt is currently at her cardiologist office and is having blood drawn. Pt's O2 at the cardiologist office today was 83% on RA. Pt wears 2L O2 at home but does not have a portable O2 tank. Cardiologist office advised daughter to contact PCP office for portable O2. Daughter states pt has been more SOB lately and is SOB at rest. RN asked daughter if the pt has to lean forward and work very hard to breathe and the daughter states she does hunch forward a lot but is not sure why she does that. Pt denies CP. Daughter reports that when the pt is wearing 2L O2 at home her O2 sat is 88-96%. Daughter reports pt has been coughing lately with clear sputum. Sometimes she will feel she has to vomit after coughing. Given that pt is having SOB at rest and is hypoxic, RN advised pt should go to the ED. Daughter declined at this time and states they just need a portable O2 tank and would like to be seen in the office Thursday. RN advised daughter ED is the best place for symptoms. RN advised pt RN would relay concerns to the office for follow-up and that if the pt's difficulty breathing because worse or severe or she has CP they have to call 911. Daughter verbalized understanding.    Reason for Disposition  Oxygen level (e.g., pulse oximetry) 90 percent or lower  Answer Assessment - Initial Assessment Questions 1. RESPIRATORY STATUS: "Describe your breathing?" (e.g., wheezing, shortness of breath, unable to speak, severe coughing)      O2 83% at cardiologist office 2.  ONSET: "When did this breathing problem begin?"      Wears 2L O2 at home; worsening in the last 5 months; feels she needs a portable O2 tank (does not wear O2 outside of the house) 3. PATTERN "Does the difficult breathing come and go, or has it been constant since it started?"      Constant  4. SEVERITY: "How bad is your breathing?" (e.g., mild, moderate, severe)    - MILD: No SOB at rest, mild SOB with walking, speaks normally in sentences, can lie down, no retractions, pulse < 100.    - MODERATE: SOB at rest, SOB with minimal exertion and prefers to sit, cannot lie down flat, speaks in phrases, mild retractions, audible wheezing, pulse 100-120.    - SEVERE: Very SOB at rest, speaks in single words, struggling to breathe, sitting hunched forward, retractions, pulse > 120      Moderate -- severe; daughter states she leans forward "all the time"; SOB at rest 5. RECURRENT SYMPTOM: "Have you had difficulty breathing before?" If Yes, ask: "When was the last time?" and "What happened that time?"      yes 6. CARDIAC HISTORY: "Do you have any history of heart disease?" (e.g., heart attack, angina, bypass surgery, angioplasty)      CHF, AFIB, HTN, aneurysm, atherosclerosis  7. LUNG HISTORY: "Do you have any history of lung disease?"  (e.g., pulmonary embolus, asthma, emphysema)     ILD, pneumonia, respiratory failure 8. CAUSE: "What do you think is causing the breathing problem?"  Not sure  9. OTHER SYMPTOMS: "Do you have any other symptoms? (e.g., dizziness, runny nose, cough, chest pain, fever)     No CP. Denies nausea, vomiting at this time. Daughter states will cough and "want to vomit." Coughing up clear phlegm. Denies fever.  10. O2 SATURATION MONITOR:  "Do you use an oxygen saturation monitor (pulse oximeter) at home?" If Yes, ask: "What is your reading (oxygen level) today?" "What is your usual oxygen saturation reading?" (e.g., 95%)       83% at cardiology; 83-92% at home (without O2), when  she wears O2 it will be 88-96%; Dialysis MWF  Protocols used: Breathing Difficulty-A-AH

## 2023-12-29 NOTE — Telephone Encounter (Signed)
 H&V Care Navigation CSW Progress Note  Clinical Social Worker received referral to order oxygen concentrator for pt. Sent message to Isa Manuel, RN, that unfortunately we are not able to order DME- would need to come from a RN/LPN/CMA with physician order. Per notes pt has o2 PRN from [employer]. Will identify [employer] contact and provide to team for next steps.   Patient is participating in a Managed Medicaid Plan:  No, [employer] Medicare and Medicaid.   SDOH Screenings   Food Insecurity: Food Insecurity Present (10/11/2023)  Housing: High Risk (10/11/2023)  Transportation Needs: Unmet Transportation Needs (10/11/2023)  Utilities: At Risk (10/11/2023)  Alcohol  Screen: Low Risk  (09/15/2023)  Depression (PHQ2-9): Low Risk  (12/15/2023)  Financial Resource Strain: High Risk (09/15/2023)  Physical Activity: Insufficiently Active (09/15/2023)  Social Connections: Moderately Integrated (10/11/2023)  Recent Concern: Social Connections - Moderately Isolated (08/17/2023)  Stress: No Stress Concern Present (09/15/2023)  Tobacco Use: Low Risk  (12/29/2023)  Health Literacy: Adequate Health Literacy (09/15/2023)    Nathen Balder, MSW, LCSW Clinical Social Worker II [facility] Heart/Vascular Care Navigation  [phone]- work cell phone (preferred)

## 2023-12-30 ENCOUNTER — Telehealth: Payer: Self-pay

## 2023-12-30 DIAGNOSIS — N2581 Secondary hyperparathyroidism of renal origin: Secondary | ICD-10-CM | POA: Diagnosis not present

## 2023-12-30 DIAGNOSIS — N186 End stage renal disease: Secondary | ICD-10-CM | POA: Diagnosis not present

## 2023-12-30 DIAGNOSIS — Z992 Dependence on renal dialysis: Secondary | ICD-10-CM | POA: Diagnosis not present

## 2023-12-30 LAB — TSH: TSH: 3.28 u[IU]/mL (ref 0.450–4.500)

## 2023-12-30 NOTE — Telephone Encounter (Signed)
 Nothing open. Options are Dr. [name] has a 15 min slot in a few daysbut that won't be enough time. Ms. Paula [name] can see her early June. [place] is easier to get into. Please advise.

## 2023-12-30 NOTE — Telephone Encounter (Signed)
-----   Message from Denson Flake sent at 12/29/2023  3:34 PM EDT ----- Regarding: FW: Appointment Can we get her in for a walking oximetry and POC qualification? Thanks. ----- Message ----- From: Keller Patella, RN Sent: 12/29/2023   2:39 PM EDT To: Eilleen Grates, MD; Denson Flake, MD Subject: Appointment                                    Patient seen today in cardiology clinic. She dropped to 88% with ambulation. She is on supplemental oxygen and states she's working on getting a concentrator since the large tanks are very hard for her to get around. Dr Lavonne Prairie would like to have her seen soon for evaluation if possible. Thanks,  Isa Manuel, RN

## 2023-12-30 NOTE — Telephone Encounter (Signed)
 Routing to front staff to schedule pt ov. Dr. Baldwin Levee has no openings and is booked until August.  Please advise if pt can be scheduled soon for O2 evaluation.

## 2023-12-30 NOTE — Telephone Encounter (Signed)
 Routed to me in error. This is Dr. Jennet Mode pt.

## 2023-12-31 ENCOUNTER — Encounter: Payer: Self-pay | Admitting: Family Medicine

## 2023-12-31 ENCOUNTER — Ambulatory Visit: Admitting: Physical Therapy

## 2023-12-31 DIAGNOSIS — M5459 Other low back pain: Secondary | ICD-10-CM | POA: Diagnosis not present

## 2023-12-31 DIAGNOSIS — G8929 Other chronic pain: Secondary | ICD-10-CM | POA: Diagnosis not present

## 2023-12-31 DIAGNOSIS — R2689 Other abnormalities of gait and mobility: Secondary | ICD-10-CM | POA: Diagnosis not present

## 2023-12-31 DIAGNOSIS — M25551 Pain in right hip: Secondary | ICD-10-CM | POA: Diagnosis not present

## 2023-12-31 DIAGNOSIS — R262 Difficulty in walking, not elsewhere classified: Secondary | ICD-10-CM | POA: Diagnosis not present

## 2023-12-31 DIAGNOSIS — M6281 Muscle weakness (generalized): Secondary | ICD-10-CM | POA: Diagnosis not present

## 2023-12-31 DIAGNOSIS — M545 Low back pain, unspecified: Secondary | ICD-10-CM | POA: Diagnosis not present

## 2023-12-31 MED ORDER — MISC. DEVICES MISC: 0 refills | Status: AC

## 2023-12-31 NOTE — Therapy (Signed)
 OUTPATIENT PHYSICAL THERAPY THORACOLUMBAR   Patient Name: Paula [name]  MRN: [id number] DOB:1946-01-29, 78 y.o., female Today's Date: 12/31/2023  END OF SESSION:  PT End of Session - 12/31/23 1612     Visit Number 2    Date for PT Re-Evaluation 03/17/24    PT Start Time 1612    PT Stop Time 1655    PT Time Calculation (min) 43 min             Past Medical History:  Diagnosis Date   Allergic rhinitis    Allergy  1/12000   Arthritis    Asthma    Brain aneurysm    Cough    Diabetes mellitus    Diastolic CHF, chronic (HCC) 10/11/2011   ESRD on hemodialysis (HCC)    MWF   GERD (gastroesophageal reflux disease)    History of colon polyps 2012   tubular adenoma    Hyperlipidemia    Hypertension    Neuropathy 10/11/2011   PONV (postoperative nausea and vomiting)    one time after lymph node surgery   Past Surgical History:  Procedure Laterality Date   AV FISTULA PLACEMENT Right 10/15/2021   Procedure: ARTERIOVENOUS (AV) FISTULA  WITH PLACEMENT OF GORE-TEX STRETCH  GRAFT (4-30mmx45cm);  Surgeon: Dannis Dy, MD;  Location: [facility] OR;  Service: Vascular;  Laterality: Right;   CATARACT EXTRACTION     right eye   IR 3D INDEPENDENT WKST  12/14/2017   IR ANGIO INTRA EXTRACRAN SEL COM CAROTID INNOMINATE BILAT MOD SED  09/15/2017   IR ANGIO INTRA EXTRACRAN SEL COM CAROTID INNOMINATE UNI L MOD SED  12/14/2017   IR ANGIO VERTEBRAL SEL VERTEBRAL BILAT MOD SED  09/15/2017   IR RADIOLOGIST EVAL & MGMT  09/10/2017   IR RADIOLOGIST EVAL & MGMT  10/19/2017   lymphatic mass surgery     NASAL TURBINATE REDUCTION     RADIOLOGY WITH ANESTHESIA N/A 12/14/2017   Procedure: RADIOLOGY WITH ANESTHESIA EMBOLIZATION;  Surgeon: Luellen Sages, MD;  Location: [facility] OR;  Service: Radiology;  Laterality: N/A;   VIDEO BRONCHOSCOPY Bilateral 11/18/2017   Procedure: VIDEO BRONCHOSCOPY WITHOUT FLUORO;  Surgeon: Denson Flake, MD;  Location: [facility] ENDOSCOPY;  Service: Cardiopulmonary;   Laterality: Bilateral;   Patient Active Problem List   Diagnosis Date Noted   Hypoxemia 12/29/2023   Abnormal electrocardiogram (ECG) (EKG) 12/29/2023   Left ventricular hypertrophy 12/29/2023   CHF (congestive heart failure) (HCC) 10/12/2023   Blurry vision 10/11/2023   Anemia due to chronic kidney disease 10/11/2023   Acute respiratory failure with hypoxia (HCC) 08/17/2023   PNA (pneumonia) 08/17/2023   Volume overload 08/17/2023   Acute hypoxic respiratory failure (HCC) 08/17/2023   SNHL (sensory-neural hearing loss), 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   ESRD on dialysis (HCC) 11/20/2022   Mold exposure 08/14/2022   Opioid withdrawal (HCC) 08/14/2022   ILD (interstitial lung disease) (HCC) 08/14/2022   Secondary hyperparathyroidism, renal (HCC) 08/14/2022   Paroxysmal A-fib (HCC) 05/20/2022   Hypertrophy of nasal turbinates 09/05/2021   Chronic kidney disease, stage 5 (HCC)    Aortic atherosclerosis (HCC) 06/24/2021   Chronic rhinitis 10/01/2017   Seasonal allergic conjunctivitis 10/01/2017   Aneurysm (HCC) 09/24/2017   Acute metabolic encephalopathy 09/02/2017   Allergic rhinitis 08/20/2017   Palpitations 07/10/2016   Gout 01/23/2016   History of cataract 02/27/2015   Seasonal and perennial allergic rhinitis 02/15/2012   Chronic diastolic CHF (congestive heart failure) (HCC) 10/11/2011   GERD (gastroesophageal reflux disease)    Type 2 diabetes mellitus  without complication, without long-term current use of insulin  (HCC) 07/03/2010   Hyperlipidemia 07/03/2010   Essential hypertension 07/03/2010    PCP: Joaquin Mulberry  REFERRING PROVIDER: Joaquin Mulberry  REFERRING DIAG: M54.50,G89.29 (ICD-10-CM) - Chronic right-sided low back pain without sciaticaM25.551 (ICD-10-CM) - Right hip pain  Rationale for Evaluation and Treatment: Rehabilitation  THERAPY DIAG:  Muscle weakness (generalized)  Difficulty in walking, not elsewhere classified  Other low back pain  Other  abnormalities of gait and mobility  Balance problem  Pain in right hip  ONSET DATE: 10/11/23  SUBJECTIVE:                                                                                                                                                                                           SUBJECTIVE STATEMENT:  I am here. Have not done HEP yet except STS because I have been very busy. Pain comes and goes    I have problems with my balance and my hip and back are hurting. I can only stand for about 2-5 mins. I was in the hospital because I had a fall. I was there for 2 days. Was doing home health at home up until last Thursday 12/17/23.   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   She experiences pain in the lower back, specifically in the right hip and lower back, which makes standing for more than two minutes difficult. The pain is localized to the hip area without radiation down the leg. She is receiving physical therapy at home and is interested in outpatient therapy. Tylenol  and a heating pad provide pain relief. Hip x-ray from 2024 revealed degenerative changes send pubic symphysis, both hips, SI joints and lower lumbar spine.  She fell from her bed last Monday, resulting in pain on the left side of her head and right shoulder, which was swollen but has improved. No current swelling or pain with pressure. She experiences dizziness at times and uses a cane occasionally.    PAIN:  Are you  having pain? Yes: NPRS scale: 810 Pain location: R hip and midline low back  Pain description: not there all the time, if I am sitting I am okay Aggravating factors:  when I get up, when I am standing, walking  Relieving factors: Tylenol  and gabapentin   PRECAUTIONS: Fall  RED FLAGS: None   WEIGHT BEARING RESTRICTIONS: No  FALLS:  Has patient fallen in last 6 months? Yes. Number of falls 2 knee gave out in March, then 1 time fell out of the bed   LIVING ENVIRONMENT: Lives with: lives with their family Lives in: House/apartment Stairs: Yes: Internal: 14 steps; on right going up and the wall on the other side Has following equipment at home: Single point cane and Walker - 2 wheeled  OCCUPATION: N/A  PLOF: Independent with basic ADLs  PATIENT GOALS: to get rid of the pain, be able to walk and stand for longer than 2 mins without the pain  NEXT MD VISIT: 03/01/24  OBJECTIVE:  Note: Objective measures were completed at Evaluation unless otherwise noted.  DIAGNOSTIC FINDINGS:  X ray ordered for hips, lumbar, and knees   COGNITION: Overall cognitive status: Within functional limits for tasks assessed     SENSATION: WFL  POSTURE: rounded shoulders, forward head, and increased thoracic kyphosis  PALPATION: TTP and pain lumbar spine   LUMBAR ROM:   AROM eval  Flexion Distal shin with pain  Extension 25% available  Right lateral flexion Mid thigh with pain  Left lateral flexion Mid thigh with pain  Right rotation 75% available with pain  Left rotation 75% available with pain   (Blank rows = not tested)  LOWER EXTREMITY ROM:   grossly WFL   LOWER EXTREMITY MMT:  grossly 4-/5 BLE    FUNCTIONAL TESTS:  5 times sit to stand: 43s from elevated mat and needing UE push off, loses balance on to heels  Timed up and go (TUG): 24s   GAIT: Distance walked: in clinic distances Assistive device utilized: None Level of assistance: CGA Comments: furniture walking, or reaches  out to grab daughter's hand to walk  TREATMENT DATE:   12/31/23 Nustep L 3 Feet on ball bridge, KTC and obl 10 x each Isometric abdominals 10 x hold 3 sec ADD ball squeeze 10 x then 10 x with bridge Red tband clams 2 sets 10  Red tband marching 2 sets 10 PROM BIL LE And trunk- very tight STS 5x with UE and the elevated mat without UE 5 x Seated row and shld ext 2 sets 10 Seated trunk ext 2 sets 10 green tband     12/24/23- EVAL, HEP                                                                                                                                 PATIENT EDUCATION:  Education details: POC, HEP, importance of movement Person educated: Patient and Child(ren) Education method: Explanation and Demonstration Education comprehension: verbalized understanding  HOME EXERCISE PROGRAM: Access Code: [email or web address] URL: [email or web address] Date: 12/24/2023 Prepared by: Donavon Fudge  Exercises - Supine Lower Trunk Rotation  - 1 x daily -  7 x weekly - 2 sets - 10 reps - Seated March  - 1 x daily - 7 x weekly - 2 sets - 10 reps - Seated Hip Abduction with Resistance  - 1 x daily - 7 x weekly - 2 sets - 10 reps - Sit to Stand  - 1 x daily - 7 x weekly - 2 sets - 5 reps  ASSESSMENT:  CLINICAL IMPRESSION:  pt arrived for 1st visit after eval. She tolerated initial strengthening ex well with verb and tactile cuing needed. Very tight in BIL LE and trunk    Patient is a 78 y.o. female who was seen today for physical therapy evaluation and treatment for balance, gait, low back and hip pain. She is ambulating without a device but is very unsteady and shuffles her feet. She was advised to use the [facility] at the least. Patient has a chronic cough that is pretty bad. Her daughter reports she is not complaint with HEP and spends most of the day sitting on the sofa. She presents with LE weakness and balance deficits that put her at a high fall risk. She states that her back and  hip pain prevent her from doing anything. Patient is very wobbly when going from sit to stand especially after prolonged sitting. She will benefit from skilled PT to address her gait and balance impairments and decrease her hip and back pain to be more mobile.   OBJECTIVE IMPAIRMENTS: Abnormal gait, cardiopulmonary status limiting activity, decreased activity tolerance, decreased balance, decreased coordination, decreased endurance, decreased mobility, difficulty walking, decreased ROM, decreased strength, postural dysfunction, and pain.   ACTIVITY LIMITATIONS: lifting, bending, sitting, standing, squatting, transfers, and locomotion level  PARTICIPATION LIMITATIONS: meal prep, cleaning, laundry, shopping, and community activity  PERSONAL FACTORS: Age, Behavior pattern, Past/current experiences, Time since onset of injury/illness/exacerbation, and 3+ comorbidities: CHF, ESRD on dialysis, neuropathy, chronic cough are also affecting patient's functional outcome.   REHAB POTENTIAL: Fair    CLINICAL DECISION MAKING: Stable/uncomplicated  EVALUATION COMPLEXITY: Low   GOALS: Goals reviewed with patient? Yes  SHORT TERM GOALS: Target date: 02/04/24  Patient will be independent with initial HEP. Baseline: given 12/24/23 Goal status: INITIAL  2.  Patient will demonstrate decreased fall risk by scoring < 15 sec on TUG. Baseline: 24s Goal status: INITIAL   LONG TERM GOALS: Target date: 03/17/24  Patient will be independent with advanced/ongoing HEP to improve outcomes and carryover.  Baseline:  Goal status: INITIAL  2.  Patient will be able to ambulate 500' with good safety to access community.  Baseline: not using AD, very unsteady and unable to walk or stand more than 2 mins Goal status: INITIAL  3.  Patient will report 50-75% improvement in R hip and low back pain Baseline: 10/10  Goal status: INITIAL   4.  Patient will demonstrate improved functional LE strength as demonstrated by  5xSTS <20s Baseline: 43s elevated mat and UE push off Goal status: INITIAL  5.  Patient will increase standing and walking tolerance to 10 mins  Baseline: 2-5 mins  Goal status: INITIAL   PLAN:  PT FREQUENCY: 2x/week  PT DURATION: 12 weeks  PLANNED INTERVENTIONS: 97110-Therapeutic exercises, 97530- Therapeutic activity, 97112- Neuromuscular re-education, 97535- Self Care, 02725- Manual therapy, [phone]- Gait training, [phone]- Electrical stimulation (unattended), Patient/Family education, Balance training, Stair training, Dry Needling, Joint mobilization, Spinal mobilization, Cryotherapy, and Moist heat.  PLAN FOR NEXT SESSION: LE strengthening, balance training, low back and hip stretches just trying to get her  moving    Paula [name],ANGIE, PTA 12/31/2023, 4:13 PM [facility] at [facility] [address] [facility] [place], [place], [address] Phone: [phone]   Fax:  [phone]  Patient Details  Name: Paula [name]  MRN: [id number] Date of Birth: March 07, 1946 Referring Provider:  Joaquin Mulberry, MD  Encounter Date: 12/31/2023   Aquilla Bayley, PTA 12/31/2023, 4:13 PM  [place] [facility] at [facility] [address] [place], [place], [address] Phone: [phone]   Fax:  [phone]

## 2023-12-31 NOTE — Telephone Encounter (Signed)
 Prescription has been written.

## 2023-12-31 NOTE — Addendum Note (Signed)
 Addended by: Cassius Cullinane on: 12/31/2023 01:02 PM   Modules accepted: Orders

## 2023-12-31 NOTE — Telephone Encounter (Signed)
 ATC pt no answer- left message to call back.  BW has opening 5/30 at 8:30am as pt has seen her before. Please schedule pt appt where neccessary as long as pt approves.  Preferably with NP as she sees Dr. Baldwin Levee Please advise

## 2024-01-01 ENCOUNTER — Ambulatory Visit: Payer: Self-pay | Admitting: Cardiology

## 2024-01-01 DIAGNOSIS — N186 End stage renal disease: Secondary | ICD-10-CM | POA: Diagnosis not present

## 2024-01-01 DIAGNOSIS — N2581 Secondary hyperparathyroidism of renal origin: Secondary | ICD-10-CM | POA: Diagnosis not present

## 2024-01-01 DIAGNOSIS — Z992 Dependence on renal dialysis: Secondary | ICD-10-CM | POA: Diagnosis not present

## 2024-01-01 DIAGNOSIS — I517 Cardiomegaly: Secondary | ICD-10-CM

## 2024-01-01 NOTE — Telephone Encounter (Signed)
Made appt. NFN

## 2024-01-01 NOTE — Telephone Encounter (Signed)
 Patient daughter is aware order has been sent

## 2024-01-02 ENCOUNTER — Ambulatory Visit: Payer: Self-pay | Admitting: Family Medicine

## 2024-01-04 DIAGNOSIS — Z992 Dependence on renal dialysis: Secondary | ICD-10-CM | POA: Diagnosis not present

## 2024-01-04 DIAGNOSIS — N2581 Secondary hyperparathyroidism of renal origin: Secondary | ICD-10-CM | POA: Diagnosis not present

## 2024-01-04 DIAGNOSIS — N186 End stage renal disease: Secondary | ICD-10-CM | POA: Diagnosis not present

## 2024-01-06 DIAGNOSIS — N186 End stage renal disease: Secondary | ICD-10-CM | POA: Diagnosis not present

## 2024-01-06 DIAGNOSIS — Z992 Dependence on renal dialysis: Secondary | ICD-10-CM | POA: Diagnosis not present

## 2024-01-06 DIAGNOSIS — N2581 Secondary hyperparathyroidism of renal origin: Secondary | ICD-10-CM | POA: Diagnosis not present

## 2024-01-07 ENCOUNTER — Ambulatory Visit: Admitting: Physical Therapy

## 2024-01-07 DIAGNOSIS — M6281 Muscle weakness (generalized): Secondary | ICD-10-CM

## 2024-01-07 DIAGNOSIS — R262 Difficulty in walking, not elsewhere classified: Secondary | ICD-10-CM

## 2024-01-07 DIAGNOSIS — M5459 Other low back pain: Secondary | ICD-10-CM | POA: Diagnosis not present

## 2024-01-07 DIAGNOSIS — M25551 Pain in right hip: Secondary | ICD-10-CM | POA: Diagnosis not present

## 2024-01-07 DIAGNOSIS — R2689 Other abnormalities of gait and mobility: Secondary | ICD-10-CM | POA: Diagnosis not present

## 2024-01-07 DIAGNOSIS — M545 Low back pain, unspecified: Secondary | ICD-10-CM | POA: Diagnosis not present

## 2024-01-07 DIAGNOSIS — G8929 Other chronic pain: Secondary | ICD-10-CM | POA: Diagnosis not present

## 2024-01-07 NOTE — Therapy (Signed)
 OUTPATIENT PHYSICAL THERAPY THORACOLUMBAR   Patient Name: Paula [name]  MRN: [id number] DOB:Jul 08, 1946, 78 y.o., female Today's Date: 01/07/2024  END OF SESSION:  PT End of Session - 01/07/24 1616     Visit Number 3    Date for PT Re-Evaluation 03/17/24    Authorization Type [employer]    PT Start Time 1615    PT Stop Time 1700    PT Time Calculation (min) 45 min             Past Medical History:  Diagnosis Date   Allergic rhinitis    Allergy  1/12000   Arthritis    Asthma    Brain aneurysm    Cough    Diabetes mellitus    Diastolic CHF, chronic (HCC) 10/11/2011   ESRD on hemodialysis (HCC)    MWF   GERD (gastroesophageal reflux disease)    History of colon polyps 2012   tubular adenoma    Hyperlipidemia    Hypertension    Neuropathy 10/11/2011   PONV (postoperative nausea and vomiting)    one time after lymph node surgery   Past Surgical History:  Procedure Laterality Date   AV FISTULA PLACEMENT Right 10/15/2021   Procedure: ARTERIOVENOUS (AV) FISTULA  WITH PLACEMENT OF GORE-TEX STRETCH  GRAFT (4-53mmx45cm);  Surgeon: Dannis Dy, MD;  Location: [facility] OR;  Service: Vascular;  Laterality: Right;   CATARACT EXTRACTION     right eye   IR 3D INDEPENDENT WKST  12/14/2017   IR ANGIO INTRA EXTRACRAN SEL COM CAROTID INNOMINATE BILAT MOD SED  09/15/2017   IR ANGIO INTRA EXTRACRAN SEL COM CAROTID INNOMINATE UNI L MOD SED  12/14/2017   IR ANGIO VERTEBRAL SEL VERTEBRAL BILAT MOD SED  09/15/2017   IR RADIOLOGIST EVAL & MGMT  09/10/2017   IR RADIOLOGIST EVAL & MGMT  10/19/2017   lymphatic mass surgery     NASAL TURBINATE REDUCTION     RADIOLOGY WITH ANESTHESIA N/A 12/14/2017   Procedure: RADIOLOGY WITH ANESTHESIA EMBOLIZATION;  Surgeon: Luellen Sages, MD;  Location: [facility] OR;  Service: Radiology;  Laterality: N/A;   VIDEO BRONCHOSCOPY Bilateral 11/18/2017   Procedure: VIDEO BRONCHOSCOPY WITHOUT FLUORO;  Surgeon: Denson Flake, MD;  Location: [facility] ENDOSCOPY;   Service: Cardiopulmonary;  Laterality: Bilateral;   Patient Active Problem List   Diagnosis Date Noted   Hypoxemia 12/29/2023   Abnormal electrocardiogram (ECG) (EKG) 12/29/2023   Left ventricular hypertrophy 12/29/2023   CHF (congestive heart failure) (HCC) 10/12/2023   Blurry vision 10/11/2023   Anemia due to chronic kidney disease 10/11/2023   Acute respiratory failure with hypoxia (HCC) 08/17/2023   PNA (pneumonia) 08/17/2023   Volume overload 08/17/2023   Acute hypoxic respiratory failure (HCC) 08/17/2023   SNHL (sensory-neural hearing loss), 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   ESRD on dialysis (HCC) 11/20/2022   Mold exposure 08/14/2022   Opioid withdrawal (HCC) 08/14/2022   ILD (interstitial lung disease) (HCC) 08/14/2022   Secondary hyperparathyroidism, renal (HCC) 08/14/2022   Paroxysmal A-fib (HCC) 05/20/2022   Hypertrophy of nasal turbinates 09/05/2021   Chronic kidney disease, stage 5 (HCC)    Aortic atherosclerosis (HCC) 06/24/2021   Chronic rhinitis 10/01/2017   Seasonal allergic conjunctivitis 10/01/2017   Aneurysm (HCC) 09/24/2017   Acute metabolic encephalopathy 09/02/2017   Allergic rhinitis 08/20/2017   Palpitations 07/10/2016   Gout 01/23/2016   History of cataract 02/27/2015   Seasonal and perennial allergic rhinitis 02/15/2012   Chronic diastolic CHF (congestive heart failure) (HCC) 10/11/2011   GERD (gastroesophageal reflux disease)  Type 2 diabetes mellitus without complication, without long-term current use of insulin  (HCC) 07/03/2010   Hyperlipidemia 07/03/2010   Essential hypertension 07/03/2010    PCP: Joaquin Mulberry  REFERRING PROVIDER: Joaquin Mulberry  REFERRING DIAG: M54.50,G89.29 (ICD-10-CM) - Chronic right-sided low back pain without sciaticaM25.551 (ICD-10-CM) - Right hip pain  Rationale for Evaluation and Treatment: Rehabilitation  THERAPY DIAG:  Muscle weakness (generalized)  Difficulty in walking, not elsewhere classified  Other low  back pain  ONSET DATE: 10/11/23  SUBJECTIVE:                                                                                                                                                                                           SUBJECTIVE STATEMENT:  LB into RT hip pain, sometime mid back. 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   She experiences pain in the lower back, specifically in the right hip and lower back, which makes standing for more than two minutes difficult. The pain is localized to the hip area without radiation down the leg. She is receiving physical therapy at home and is interested in outpatient therapy. Tylenol  and a heating pad provide pain relief. Hip x-ray from 2024 revealed degenerative changes send pubic symphysis, both hips, SI joints and lower lumbar spine.  She fell from her bed last Monday, resulting in pain on the left side of her head and right shoulder, which was swollen but has improved. No current swelling or pain with pressure. She experiences dizziness at times and uses a cane occasionally.    PAIN:  Are you having pain? Yes: NPRS scale: 710 Pain location: R hip and midline low back  Pain description: not there all the time, if I am sitting I am okay Aggravating factors: when I get up, when I am standing, walking  Relieving factors: Tylenol  and gabapentin   PRECAUTIONS: Fall  RED FLAGS: None   WEIGHT BEARING RESTRICTIONS:  No  FALLS:  Has patient fallen in last 6 months? Yes. Number of falls 2 knee gave out in March, then 1 time fell out of the bed   LIVING ENVIRONMENT: Lives with: lives with their family Lives in: House/apartment Stairs:  Yes: Internal: 14 steps; on right going up and the wall on the other side Has following equipment at home: Single point cane and Walker - 2 wheeled  OCCUPATION: N/A  PLOF: Independent with basic ADLs  PATIENT GOALS: to get rid of the pain, be able to walk and stand for longer than 2 mins without the pain  NEXT MD VISIT: 03/01/24  OBJECTIVE:  Note: Objective measures were completed at Evaluation unless otherwise noted.  DIAGNOSTIC FINDINGS:  X ray ordered for hips, lumbar, and knees   COGNITION: Overall cognitive status: Within functional limits for tasks assessed     SENSATION: WFL  POSTURE: rounded shoulders, forward head, and increased thoracic kyphosis  PALPATION: TTP and pain lumbar spine   LUMBAR ROM:   AROM eval  Flexion Distal shin with pain  Extension 25% available  Right lateral flexion Mid thigh with pain  Left lateral flexion Mid thigh with pain  Right rotation 75% available with pain  Left rotation 75% available with pain   (Blank rows = not tested)  LOWER EXTREMITY ROM:   grossly WFL   LOWER EXTREMITY MMT:  grossly 4-/5 BLE    FUNCTIONAL TESTS:  5 times sit to stand: 43s from elevated mat and needing UE push off, loses balance on to heels  Timed up and go (TUG): 24s   GAIT: Distance walked: in clinic distances Assistive device utilized: None Level of assistance: CGA Comments: furniture walking, or reaches out to grab daughter's hand to walk  TREATMENT DATE:   01/07/24 Nustep L 4 6 min Standing red tband shld ext and row 2 sets 10 Standing red tband hip ext and abd 10 xeach Seated trunk ext 2 sets 10 green tband STS 2 sets 5 with wt ball chest press Seated clams and hip flex 2 sets 10 Seated ADD with ball squeeze PROM  BIL LE and trunk- tightness RT> left. RT IT tenderness Feet on ball bridge, obl and HS curl Isometric abdominals   12/31/23 Nustep L 3 Feet on ball bridge, KTC and obl 10 x each Isometric abdominals 10 x hold 3 sec ADD ball squeeze 10 x then 10 x with bridge Red tband clams 2 sets 10  Red tband marching 2 sets 10 PROM BIL LE And trunk- very tight STS 5x with UE and the elevated mat without UE 5 x Seated row and shld ext 2 sets 10 Seated trunk ext 2 sets 10 green tband     12/24/23- EVAL, HEP                                                                                                                                 PATIENT EDUCATION:  Education details: POC, HEP, importance of movement Person educated: Patient and Child(ren) Education method: Medical illustrator Education comprehension: verbalized understanding  HOME EXERCISE PROGRAM: Access Code: [email or web address] URL: [email or web address] Date: 12/24/2023 Prepared by: Donavon Fudge  Exercises - Supine Lower  Trunk Rotation  - 1 x daily - 7 x weekly - 2 sets - 10 reps - Seated March  - 1 x daily - 7 x weekly - 2 sets - 10 reps - Seated Hip Abduction with Resistance  - 1 x daily - 7 x weekly - 2 sets - 10 reps - Sit to Stand  - 1 x daily - 7 x weekly - 2 sets - 5 reps  ASSESSMENT:  CLINICAL IMPRESSION:  goals assessed and documented. STGs met.Progressed core strengthening with cuing to activate core needed. Pt rates pain 7/10 and stated limited standing to 5 min. States back is very bad when she wakes up in the morning.Pt is tight in LE and trunk. Pt will continue to benefit from skilled therapy to address pain and weakness.   Patient is a 78 y.o. female who was seen today for physical therapy evaluation and treatment for balance, gait, low back and hip pain. She is ambulating without a device but is very unsteady and shuffles her feet. She was advised to use the [facility] at the least. Patient has a chronic  cough that is pretty bad. Her daughter reports she is not complaint with HEP and spends most of the day sitting on the sofa. She presents with LE weakness and balance deficits that put her at a high fall risk. She states that her back and hip pain prevent her from doing anything. Patient is very wobbly when going from sit to stand especially after prolonged sitting. She will benefit from skilled PT to address her gait and balance impairments and decrease her hip and back pain to be more mobile.   OBJECTIVE IMPAIRMENTS: Abnormal gait, cardiopulmonary status limiting activity, decreased activity tolerance, decreased balance, decreased coordination, decreased endurance, decreased mobility, difficulty walking, decreased ROM, decreased strength, postural dysfunction, and pain.   ACTIVITY LIMITATIONS: lifting, bending, sitting, standing, squatting, transfers, and locomotion level  PARTICIPATION LIMITATIONS: meal prep, cleaning, laundry, shopping, and community activity  PERSONAL FACTORS: Age, Behavior pattern, Past/current experiences, Time since onset of injury/illness/exacerbation, and 3+ comorbidities: CHF, ESRD on dialysis, neuropathy, chronic cough are also affecting patient's functional outcome.   REHAB POTENTIAL: Fair    CLINICAL DECISION MAKING: Stable/uncomplicated  EVALUATION COMPLEXITY: Low   GOALS: Goals reviewed with patient? Yes  SHORT TERM GOALS: Target date: 02/04/24  Patient will be independent with initial HEP. Baseline: given 12/24/23 Goal status: 01/07/24 MET  2.  Patient will demonstrate decreased fall risk by scoring < 15 sec on TUG. Baseline: 24s Goal status: 01/07/24 MET   LONG TERM GOALS: Target date: 03/17/24  Patient will be independent with advanced/ongoing HEP to improve outcomes and carryover.  Baseline:  Goal status: INITIAL  2.  Patient will be able to ambulate 500' with good safety to access community.  Baseline: not using AD, very unsteady and unable to  walk or stand more than 2 mins Goal status: INITIAL  3.  Patient will report 50-75% improvement in R hip and low back pain Baseline: 10/10  Goal status: INITIAL   4.  Patient will demonstrate improved functional LE strength as demonstrated by 5xSTS <20s Baseline: 43s elevated mat and UE push off Goal status: INITIAL  5.  Patient will increase standing and walking tolerance to 10 mins  Baseline: 2-5 mins  Goal status: INITIAL   PLAN:  PT FREQUENCY: 2x/week  PT DURATION: 12 weeks  PLANNED INTERVENTIONS: 97110-Therapeutic exercises, 97530- Therapeutic activity, [id number]- Neuromuscular re-education, 97535- Self Care, 62952- Manual therapy, [phone]- Gait training,  [id number]- Electrical stimulation (unattended), Patient/Family education, Balance training, Stair training, Dry Needling, Joint mobilization, Spinal mobilization, Cryotherapy, and Moist heat.  PLAN FOR NEXT SESSION: LE strengthening, balance training, low back and hip stretches . INCREASE HEP   Maygan Koeller,ANGIE, PTA 01/07/2024, 4:16 PM [facility] [address] [facility]. [place], [place], [address] Phone: [phone]   Fax:  [phone]  Patient Details  Name: Oluwanifemi Petitti Alfonzo  MRN: [id number] Date of Birth: [birth year] Referring Provider:  Joaquin Mulberry, MD

## 2024-01-08 DIAGNOSIS — Z992 Dependence on renal dialysis: Secondary | ICD-10-CM | POA: Diagnosis not present

## 2024-01-08 DIAGNOSIS — N2581 Secondary hyperparathyroidism of renal origin: Secondary | ICD-10-CM | POA: Diagnosis not present

## 2024-01-08 DIAGNOSIS — N186 End stage renal disease: Secondary | ICD-10-CM | POA: Diagnosis not present

## 2024-01-09 DIAGNOSIS — I129 Hypertensive chronic kidney disease with stage 1 through stage 4 chronic kidney disease, or unspecified chronic kidney disease: Secondary | ICD-10-CM | POA: Diagnosis not present

## 2024-01-09 DIAGNOSIS — N186 End stage renal disease: Secondary | ICD-10-CM | POA: Diagnosis not present

## 2024-01-09 DIAGNOSIS — Z992 Dependence on renal dialysis: Secondary | ICD-10-CM | POA: Diagnosis not present

## 2024-01-11 DIAGNOSIS — N2581 Secondary hyperparathyroidism of renal origin: Secondary | ICD-10-CM | POA: Diagnosis not present

## 2024-01-11 DIAGNOSIS — N186 End stage renal disease: Secondary | ICD-10-CM | POA: Diagnosis not present

## 2024-01-11 DIAGNOSIS — Z992 Dependence on renal dialysis: Secondary | ICD-10-CM | POA: Diagnosis not present

## 2024-01-12 ENCOUNTER — Ambulatory Visit: Attending: Family Medicine | Admitting: Physical Therapy

## 2024-01-12 ENCOUNTER — Encounter: Payer: Self-pay | Admitting: Physical Therapy

## 2024-01-12 ENCOUNTER — Ambulatory Visit: Admitting: Physical Therapy

## 2024-01-12 DIAGNOSIS — M5459 Other low back pain: Secondary | ICD-10-CM | POA: Insufficient documentation

## 2024-01-12 DIAGNOSIS — M25551 Pain in right hip: Secondary | ICD-10-CM | POA: Diagnosis not present

## 2024-01-12 DIAGNOSIS — R262 Difficulty in walking, not elsewhere classified: Secondary | ICD-10-CM | POA: Diagnosis not present

## 2024-01-12 DIAGNOSIS — M6281 Muscle weakness (generalized): Secondary | ICD-10-CM | POA: Insufficient documentation

## 2024-01-12 NOTE — Therapy (Signed)
 OUTPATIENT PHYSICAL THERAPY THORACOLUMBAR   Patient Name: Lonna Rabold Shuart  MRN: [id number] DOB:[birth year], 78 y.o., female Today's Date: 01/12/2024  END OF SESSION:  PT End of Session - 01/12/24 1347     Visit Number 4    Date for PT Re-Evaluation 03/17/24    PT Start Time 1347    PT Stop Time 1430    PT Time Calculation (min) 43 min    Activity Tolerance Patient tolerated treatment well    Behavior During Therapy [facility] for tasks assessed/performed             Past Medical History:  Diagnosis Date   Allergic rhinitis    Allergy  1/12000   Arthritis    Asthma    Brain aneurysm    Cough    Diabetes mellitus    Diastolic CHF, chronic (HCC) 10/11/2011   ESRD on hemodialysis (HCC)    MWF   GERD (gastroesophageal reflux disease)    History of colon polyps 2012   tubular adenoma    Hyperlipidemia    Hypertension    Neuropathy 10/11/2011   PONV (postoperative nausea and vomiting)    one time after lymph node surgery   Past Surgical History:  Procedure Laterality Date   AV FISTULA PLACEMENT Right 10/15/2021   Procedure: ARTERIOVENOUS (AV) FISTULA  WITH PLACEMENT OF GORE-TEX STRETCH  GRAFT (4-59mmx45cm);  Surgeon: Dannis Dy, MD;  Location: [facility] OR;  Service: Vascular;  Laterality: Right;   CATARACT EXTRACTION     right eye   IR 3D INDEPENDENT WKST  12/14/2017   IR ANGIO INTRA EXTRACRAN SEL COM CAROTID INNOMINATE BILAT MOD SED  09/15/2017   IR ANGIO INTRA EXTRACRAN SEL COM CAROTID INNOMINATE UNI L MOD SED  12/14/2017   IR ANGIO VERTEBRAL SEL VERTEBRAL BILAT MOD SED  09/15/2017   IR RADIOLOGIST EVAL & MGMT  09/10/2017   IR RADIOLOGIST EVAL & MGMT  10/19/2017   lymphatic mass surgery     NASAL TURBINATE REDUCTION     RADIOLOGY WITH ANESTHESIA N/A 12/14/2017   Procedure: RADIOLOGY WITH ANESTHESIA EMBOLIZATION;  Surgeon: Luellen Sages, MD;  Location: [facility] OR;  Service: Radiology;  Laterality: N/A;   VIDEO BRONCHOSCOPY Bilateral 11/18/2017   Procedure: VIDEO  BRONCHOSCOPY WITHOUT FLUORO;  Surgeon: Denson Flake, MD;  Location: [facility] ENDOSCOPY;  Service: Cardiopulmonary;  Laterality: Bilateral;   Patient Active Problem List   Diagnosis Date Noted   Hypoxemia 12/29/2023   Abnormal electrocardiogram (ECG) (EKG) 12/29/2023   Left ventricular hypertrophy 12/29/2023   CHF (congestive heart failure) (HCC) 10/12/2023   Blurry vision 10/11/2023   Anemia due to chronic kidney disease 10/11/2023   Acute respiratory failure with hypoxia (HCC) 08/17/2023   PNA (pneumonia) 08/17/2023   Volume overload 08/17/2023   Acute hypoxic respiratory failure (HCC) 08/17/2023   SNHL (sensory-neural hearing loss), 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   ESRD on dialysis (HCC) 11/20/2022   Mold exposure 08/14/2022   Opioid withdrawal (HCC) 08/14/2022   ILD (interstitial lung disease) (HCC) 08/14/2022   Secondary hyperparathyroidism, renal (HCC) 08/14/2022   Paroxysmal A-fib (HCC) 05/20/2022   Hypertrophy of nasal turbinates 09/05/2021   Chronic kidney disease, stage 5 (HCC)    Aortic atherosclerosis (HCC) 06/24/2021   Chronic rhinitis 10/01/2017   Seasonal allergic conjunctivitis 10/01/2017   Aneurysm (HCC) 09/24/2017   Acute metabolic encephalopathy 09/02/2017   Allergic rhinitis 08/20/2017   Palpitations 07/10/2016   Gout 01/23/2016   History of cataract 02/27/2015   Seasonal and perennial allergic rhinitis 02/15/2012   Chronic diastolic  CHF (congestive heart failure) (HCC) 10/11/2011   GERD (gastroesophageal reflux disease)    Type 2 diabetes mellitus without complication, without long-term current use of insulin  (HCC) 07/03/2010   Hyperlipidemia 07/03/2010   Essential hypertension 07/03/2010    PCP: Joaquin Mulberry  REFERRING PROVIDER: Joaquin Mulberry  REFERRING DIAG: M54.50,G89.29 (ICD-10-CM) - Chronic right-sided low back pain without sciaticaM25.551 (ICD-10-CM) - Right hip pain  Rationale for Evaluation and Treatment: Rehabilitation  THERAPY DIAG:  Muscle  weakness (generalized)  Difficulty in walking, not elsewhere classified  Other low back pain  Pain in right hip  ONSET DATE: 10/11/23  SUBJECTIVE:                                                                                                                                                                                           SUBJECTIVE STATEMENT:  "Im ok" Has back pain when standing  PERTINENT HISTORY:  Tamaiya A Escoe  is a 78 year old female with  a history of type 2 diabetes mellitus (A1c 76.9), hypertension, hypercholesterolemia, allergic rhinitis, severe upper airway cough syndrome, stage V CKD (managed by Dr [facility], currently on hemodialysis Tuesday, Thursday and Saturday), 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   She experiences pain in the lower back, specifically in the right hip and lower back, which makes standing for more than two minutes difficult. The pain is localized to the hip area without radiation down the leg. She is receiving physical therapy at home and is interested in outpatient therapy. Tylenol  and a heating pad provide pain relief. Hip x-ray from 2024 revealed degenerative changes send pubic symphysis, both hips, SI joints and lower lumbar spine.  She fell from her bed last Monday, resulting in pain on the left side of her head and right shoulder, which was swollen but has improved. No current swelling or pain with pressure. She experiences dizziness at times and uses a cane occasionally.    PAIN:  Are you having pain? Yes: NPRS scale: 5/10 Pain location: R hip and midline low back  Pain description: not there all the time, if I am sitting I am okay Aggravating factors: when I get up, when I am standing, walking  Relieving factors: Tylenol  and  gabapentin   PRECAUTIONS: Fall  RED FLAGS: None   WEIGHT BEARING RESTRICTIONS: No  FALLS:  Has patient fallen in last 6 months? Yes. Number of falls 2 knee gave out in March, then 1 time fell out of the bed  LIVING ENVIRONMENT: Lives with: lives with their family Lives in: House/apartment Stairs: Yes: Internal: 14 steps; on right going up and the wall on the other side Has following equipment at home: Single point cane and Walker - 2 wheeled  OCCUPATION: N/A  PLOF: Independent with basic ADLs  PATIENT GOALS: to get rid of the pain, be able to walk and stand for longer than 2 mins without the pain  NEXT MD VISIT: 03/01/24  OBJECTIVE:  Note: Objective measures were completed at Evaluation unless otherwise noted.  DIAGNOSTIC FINDINGS:  X ray ordered for hips, lumbar, and knees   COGNITION: Overall cognitive status: Within functional limits for tasks assessed     SENSATION: WFL  POSTURE: rounded shoulders, forward head, and increased thoracic kyphosis  PALPATION: TTP and pain lumbar spine   LUMBAR ROM:   AROM eval  Flexion Distal shin with pain  Extension 25% available  Right lateral flexion Mid thigh with pain  Left lateral flexion Mid thigh with pain  Right rotation 75% available with pain  Left rotation 75% available with pain   (Blank rows = not tested)  LOWER EXTREMITY ROM:   grossly WFL   LOWER EXTREMITY MMT:  grossly 4-/5 BLE    FUNCTIONAL TESTS:  5 times sit to stand: 43s from elevated mat and needing UE push off, loses balance on to heels  Timed up and go (TUG): 24s   GAIT: Distance walked: in clinic distances Assistive device utilized: None Level of assistance: CGA Comments: furniture walking, or reaches out to grab daughter's hand to walk  TREATMENT DATE:  01/12/24 Nustep L5  6 min 20lb resisted side steps x5 Hip add ball squeeze 2x10 Hip abd green 2x10 Sit to stand 2x10 Feet on ball bridge, obl and HS curl PROM BIL LE and trunk-  tightness RT> left. RT IT tenderness  HS, piriformis, Double and single [facility]   01/07/24 Nustep L 4 6 min Standing red tband shld ext and row 2 sets 10 Standing red tband hip ext and abd 10 xeach Seated trunk ext 2 sets 10 green tband STS 2 sets 5 with wt ball chest press Seated clams and hip flex 2 sets 10 Seated ADD with ball squeeze PROM BIL LE and trunk- tightness RT> left. RT IT tenderness Feet on ball bridge, obl and HS curl Isometric abdominals   12/31/23 Nustep L 3 Feet on ball bridge, KTC and obl 10 x each Isometric abdominals 10 x hold 3 sec ADD ball squeeze 10 x then 10 x with bridge Red tband clams 2 sets 10  Red tband marching 2 sets 10 PROM BIL LE And trunk- very tight STS 5x with UE and the elevated mat without UE 5 x Seated row and shld ext 2 sets 10 Seated trunk ext 2 sets 10 green tband     12/24/23- EVAL, HEP  PATIENT EDUCATION:  Education details: POC, HEP, importance of movement Person educated: Patient and Child(ren) Education method: Medical illustrator Education comprehension: verbalized understanding  HOME EXERCISE PROGRAM: Access Code: [email or web address] URL: [email or web address] Date: 01/12/2024 Prepared by: Towanda Fret  Exercises - Supine Lower Trunk Rotation  - 1 x daily - 7 x weekly - 2 sets - 10 reps - Seated March  - 1 x daily - 7 x weekly - 2 sets - 10 reps - Seated Hip Abduction with Resistance  - 1 x daily - 7 x weekly - 2 sets - 10 reps - Sit to Stand  - 1 x daily - 7 x weekly - 2 sets - 5 reps - Supine Bridge  - 1 x daily - 7 x weekly - 3 sets - 10 reps - Standing Hip Abduction with Counter Support  - 1 x daily - 7 x weekly - 3 sets - 10 reps  ASSESSMENT:  CLINICAL IMPRESSION:  .Progressed core strengthening with cuing to activate core with sit to stands and bridges. Cues needed  to keep hips square with resisted side steps. Pt is tight in LE and trunk. Pt will continue to benefit from skilled therapy to address pain and weakness.   Patient is a 78 y.o. female who was seen today for physical therapy evaluation and treatment for balance, gait, low back and hip pain. She is ambulating without a device but is very unsteady and shuffles her feet. She was advised to use the [facility] at the least. Patient has a chronic cough that is pretty bad. Her daughter reports she is not complaint with HEP and spends most of the day sitting on the sofa. She presents with LE weakness and balance deficits that put her at a high fall risk. She states that her back and hip pain prevent her from doing anything. Patient is very wobbly when going from sit to stand especially after prolonged sitting. She will benefit from skilled PT to address her gait and balance impairments and decrease her hip and back pain to be more mobile.   OBJECTIVE IMPAIRMENTS: Abnormal gait, cardiopulmonary status limiting activity, decreased activity tolerance, decreased balance, decreased coordination, decreased endurance, decreased mobility, difficulty walking, decreased ROM, decreased strength, postural dysfunction, and pain.   ACTIVITY LIMITATIONS: lifting, bending, sitting, standing, squatting, transfers, and locomotion level  PARTICIPATION LIMITATIONS: meal prep, cleaning, laundry, shopping, and community activity  PERSONAL FACTORS: Age, Behavior pattern, Past/current experiences, Time since onset of injury/illness/exacerbation, and 3+ comorbidities: CHF, ESRD on dialysis, neuropathy, chronic cough are also affecting patient's functional outcome.   REHAB POTENTIAL: Fair    CLINICAL DECISION MAKING: Stable/uncomplicated  EVALUATION COMPLEXITY: Low   GOALS: Goals reviewed with patient? Yes  SHORT TERM GOALS: Target date: 02/04/24  Patient will be independent with initial HEP. Baseline: given 12/24/23 Goal status:  01/07/24 MET  2.  Patient will demonstrate decreased fall risk by scoring < 15 sec on TUG. Baseline: 24s Goal status: 01/07/24 MET   LONG TERM GOALS: Target date: 03/17/24  Patient will be independent with advanced/ongoing HEP to improve outcomes and carryover.  Baseline:  Goal status: INITIAL  2.  Patient will be able to ambulate 500' with good safety to access community.  Baseline: not using AD, very unsteady and unable to walk or stand more than 2 mins Goal status: INITIAL  3.  Patient will report 50-75% improvement in R hip and low back pain Baseline: 10/10  Goal status: INITIAL   4.  Patient will demonstrate improved  functional LE strength as demonstrated by 5xSTS <20s Baseline: 43s elevated mat and UE push off Goal status: INITIAL  5.  Patient will increase standing and walking tolerance to 10 mins  Baseline: 2-5 mins  Goal status: INITIAL   PLAN:  PT FREQUENCY: 2x/week  PT DURATION: 12 weeks  PLANNED INTERVENTIONS: 97110-Therapeutic exercises, 97530- Therapeutic activity, 97112- Neuromuscular re-education, 97535- Self Care, 69629- Manual therapy, [phone]- Gait training, [phone]- Electrical stimulation (unattended), Patient/Family education, Balance training, Stair training, Dry Needling, Joint mobilization, Spinal mobilization, Cryotherapy, and Moist heat.  PLAN FOR NEXT SESSION: LE strengthening, balance training, low back and hip stretches . INCREASE HEP "Pt daughter daughter pt has not been compliant with HEP"   Ollen Beverage, PTA 01/12/2024, 1:48 PM

## 2024-01-13 DIAGNOSIS — N2581 Secondary hyperparathyroidism of renal origin: Secondary | ICD-10-CM | POA: Diagnosis not present

## 2024-01-13 DIAGNOSIS — N186 End stage renal disease: Secondary | ICD-10-CM | POA: Diagnosis not present

## 2024-01-13 DIAGNOSIS — Z992 Dependence on renal dialysis: Secondary | ICD-10-CM | POA: Diagnosis not present

## 2024-01-14 ENCOUNTER — Ambulatory Visit: Admitting: Physical Therapy

## 2024-01-14 ENCOUNTER — Encounter: Payer: Self-pay | Admitting: Physical Therapy

## 2024-01-14 DIAGNOSIS — R262 Difficulty in walking, not elsewhere classified: Secondary | ICD-10-CM

## 2024-01-14 DIAGNOSIS — M6281 Muscle weakness (generalized): Secondary | ICD-10-CM | POA: Diagnosis not present

## 2024-01-14 DIAGNOSIS — M5459 Other low back pain: Secondary | ICD-10-CM | POA: Diagnosis not present

## 2024-01-14 DIAGNOSIS — M25551 Pain in right hip: Secondary | ICD-10-CM | POA: Diagnosis not present

## 2024-01-14 NOTE — Therapy (Signed)
 OUTPATIENT PHYSICAL THERAPY THORACOLUMBAR   Patient Name: Paula [name]  MRN: [id number] DOB:1945/09/23, 78 y.o., female Today's Date: 01/14/2024  END OF SESSION:  PT End of Session - 01/14/24 1346     Visit Number 5    Date for PT Re-Evaluation 03/17/24    Authorization Type [employer]    PT Start Time 1347    PT Stop Time 1430    PT Time Calculation (min) 43 min    Activity Tolerance Patient tolerated treatment well    Behavior During Therapy [facility] for tasks assessed/performed             Past Medical History:  Diagnosis Date   Allergic rhinitis    Allergy  1/12000   Arthritis    Asthma    Brain aneurysm    Cough    Diabetes mellitus    Diastolic CHF, chronic (HCC) 10/11/2011   ESRD on hemodialysis (HCC)    MWF   GERD (gastroesophageal reflux disease)    History of colon polyps 2012   tubular adenoma    Hyperlipidemia    Hypertension    Neuropathy 10/11/2011   PONV (postoperative nausea and vomiting)    one time after lymph node surgery   Past Surgical History:  Procedure Laterality Date   AV FISTULA PLACEMENT Right 10/15/2021   Procedure: ARTERIOVENOUS (AV) FISTULA  WITH PLACEMENT OF GORE-TEX STRETCH  GRAFT (4-40mmx45cm);  Surgeon: Dannis Dy, MD;  Location: [facility] OR;  Service: Vascular;  Laterality: Right;   CATARACT EXTRACTION     right eye   IR 3D INDEPENDENT WKST  12/14/2017   IR ANGIO INTRA EXTRACRAN SEL COM CAROTID INNOMINATE BILAT MOD SED  09/15/2017   IR ANGIO INTRA EXTRACRAN SEL COM CAROTID INNOMINATE UNI L MOD SED  12/14/2017   IR ANGIO VERTEBRAL SEL VERTEBRAL BILAT MOD SED  09/15/2017   IR RADIOLOGIST EVAL & MGMT  09/10/2017   IR RADIOLOGIST EVAL & MGMT  10/19/2017   lymphatic mass surgery     NASAL TURBINATE REDUCTION     RADIOLOGY WITH ANESTHESIA N/A 12/14/2017   Procedure: RADIOLOGY WITH ANESTHESIA EMBOLIZATION;  Surgeon: Luellen Sages, MD;  Location: [facility] OR;  Service: Radiology;  Laterality: N/A;   VIDEO BRONCHOSCOPY Bilateral  11/18/2017   Procedure: VIDEO BRONCHOSCOPY WITHOUT FLUORO;  Surgeon: Denson Flake, MD;  Location: [facility] ENDOSCOPY;  Service: Cardiopulmonary;  Laterality: Bilateral;   Patient Active Problem List   Diagnosis Date Noted   Hypoxemia 12/29/2023   Abnormal electrocardiogram (ECG) (EKG) 12/29/2023   Left ventricular hypertrophy 12/29/2023   CHF (congestive heart failure) (HCC) 10/12/2023   Blurry vision 10/11/2023   Anemia due to chronic kidney disease 10/11/2023   Acute respiratory failure with hypoxia (HCC) 08/17/2023   PNA (pneumonia) 08/17/2023   Volume overload 08/17/2023   Acute hypoxic respiratory failure (HCC) 08/17/2023   SNHL (sensory-neural hearing loss), 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   ESRD on dialysis (HCC) 11/20/2022   Mold exposure 08/14/2022   Opioid withdrawal (HCC) 08/14/2022   ILD (interstitial lung disease) (HCC) 08/14/2022   Secondary hyperparathyroidism, renal (HCC) 08/14/2022   Paroxysmal A-fib (HCC) 05/20/2022   Hypertrophy of nasal turbinates 09/05/2021   Chronic kidney disease, stage 5 (HCC)    Aortic atherosclerosis (HCC) 06/24/2021   Chronic rhinitis 10/01/2017   Seasonal allergic conjunctivitis 10/01/2017   Aneurysm (HCC) 09/24/2017   Acute metabolic encephalopathy 09/02/2017   Allergic rhinitis 08/20/2017   Palpitations 07/10/2016   Gout 01/23/2016   History of cataract 02/27/2015   Seasonal and perennial allergic  rhinitis 02/15/2012   Chronic diastolic CHF (congestive heart failure) (HCC) 10/11/2011   GERD (gastroesophageal reflux disease)    Type 2 diabetes mellitus without complication, without long-term current use of insulin  (HCC) 07/03/2010   Hyperlipidemia 07/03/2010   Essential hypertension 07/03/2010    PCP: Joaquin Mulberry  REFERRING PROVIDER: Joaquin Mulberry  REFERRING DIAG: M54.50,G89.29 (ICD-10-CM) - Chronic right-sided low back pain without sciaticaM25.551 (ICD-10-CM) - Right hip pain  Rationale for Evaluation and Treatment:  Rehabilitation  THERAPY DIAG:  Muscle weakness (generalized)  Difficulty in walking, not elsewhere classified  Pain in right hip  Other low back pain  ONSET DATE: 10/11/23  SUBJECTIVE:                                                                                                                                                                                           SUBJECTIVE STATEMENT: "Im in pain from the waist down"  PERTINENT HISTORY:  Paula [name]  is a 78 year old female with  a history of type 2 diabetes mellitus (A1c 76.9), hypertension, hypercholesterolemia, allergic rhinitis, severe upper airway cough syndrome, stage V CKD (managed by Dr [facility], currently on hemodialysis Tuesday, Thursday and Saturday), 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   She experiences pain in the lower back, specifically in the right hip and lower back, which makes standing for more than two minutes difficult. The pain is localized to the hip area without radiation down the leg. She is receiving physical therapy at home and is interested in outpatient therapy. Tylenol  and a heating pad provide pain relief. Hip x-ray from 2024 revealed degenerative changes send pubic symphysis, both hips, SI joints and lower lumbar spine.  She fell from her bed last Monday, resulting in pain on the left side of her head and right shoulder, which was swollen but has improved. No current swelling or pain with pressure. She experiences dizziness at times and uses a cane occasionally.    PAIN:  Are you having pain? Yes: NPRS scale: 6/10 Pain location: R hip and midline low back  Pain description: not there all the time, if I am sitting I am okay Aggravating factors: when I get up, when I am standing, walking   Relieving factors: Tylenol  and gabapentin   PRECAUTIONS: Fall  RED FLAGS: None   WEIGHT BEARING RESTRICTIONS: No  FALLS:  Has patient fallen in last 6 months? Yes. Number of falls 2 knee gave out in March, then 1 time  fell out of the bed   LIVING ENVIRONMENT: Lives with: lives with their family Lives in: House/apartment Stairs: Yes: Internal: 14 steps; on right going up and the wall on the other side Has following equipment at home: Single point cane and Walker - 2 wheeled  OCCUPATION: N/A  PLOF: Independent with basic ADLs  PATIENT GOALS: to get rid of the pain, be able to walk and stand for longer than 2 mins without the pain  NEXT MD VISIT: 03/01/24  OBJECTIVE:  Note: Objective measures were completed at Evaluation unless otherwise noted.  DIAGNOSTIC FINDINGS:  X ray ordered for hips, lumbar, and knees   COGNITION: Overall cognitive status: Within functional limits for tasks assessed     SENSATION: WFL  POSTURE: rounded shoulders, forward head, and increased thoracic kyphosis  PALPATION: TTP and pain lumbar spine   LUMBAR ROM:   AROM eval 01/14/24  Flexion Distal shin with pain WFL  Extension 25% available Limited 25%  Right lateral flexion Mid thigh with pain Limited 25%  Left lateral flexion Mid thigh with pain WFL  Right rotation 75% available with pain WFL  Left rotation 75% available with pain WFL   (Blank rows = not tested)  LOWER EXTREMITY ROM:   grossly WFL   LOWER EXTREMITY MMT:  grossly 4-/5 BLE    FUNCTIONAL TESTS:  5 times sit to stand: 43s from elevated mat and needing UE push off, loses balance on to heels  Timed up and go (TUG): 24s   GAIT: Distance walked: in clinic distances Assistive device utilized: None Level of assistance: CGA Comments: furniture walking, or reaches out to grab daughter's hand to walk  TREATMENT DATE:  01/14/24 NuStep L5 x 6 min Goals  Lumbar ORM 5x S2S 19.54 LE did push against table on first rep  Gait  three laps ~ 324ft some fatigue and instability at times  HS curls 20lb 2x10 Leg Ext 10lb 2x10 Feet on ball bridge, obl and HS curl PROM BIL LE and trunk- tightness RT> left. RT IT tenderness  HS, piriformis, Double and single [facility]  01/12/24 Nustep L5  6 min 20lb resisted side steps x5 Hip add ball squeeze 2x10 Hip abd green 2x10 Sit to stand 2x10 Feet on ball bridge, obl and HS curl PROM BIL LE and trunk- tightness RT> left. RT IT tenderness  HS, piriformis, Double and single [facility]   01/07/24 Nustep L 4 6 min Standing red tband shld ext and row 2 sets 10 Standing red tband hip ext and abd 10 xeach Seated trunk ext 2 sets 10 green tband STS 2 sets 5 with wt ball chest press Seated clams and hip flex 2 sets 10 Seated ADD with ball squeeze PROM BIL LE and trunk- tightness RT> left. RT IT tenderness Feet on ball bridge, obl and HS curl Isometric abdominals   12/31/23 Nustep L 3 Feet on ball bridge, KTC and obl 10 x each Isometric abdominals 10 x hold 3 sec ADD ball squeeze 10 x then 10 x with bridge Red tband clams 2 sets 10  Red tband marching 2 sets 10 PROM BIL LE And trunk- very tight STS 5x with UE and the elevated mat without UE 5 x Seated row and shld ext 2 sets 10 Seated trunk ext 2 sets 10 green tband     12/24/23- EVAL, HEP  PATIENT EDUCATION:  Education details: POC, HEP, importance of movement Person educated: Patient and Child(ren) Education method: Medical illustrator Education comprehension: verbalized understanding  HOME EXERCISE PROGRAM: Access Code: [email or web address] URL: [email or web address] Date: 01/12/2024 Prepared by: Towanda Fret  Exercises - Supine Lower Trunk Rotation  - 1 x daily - 7 x weekly - 2 sets - 10 reps - Seated March  - 1 x daily - 7 x weekly - 2 sets - 10 reps - Seated Hip  Abduction with Resistance  - 1 x daily - 7 x weekly - 2 sets - 10 reps - Sit to Stand  - 1 x daily - 7 x weekly - 2 sets - 5 reps - Supine Bridge  - 1 x daily - 7 x weekly - 3 sets - 10 reps - Standing Hip Abduction with Counter Support  - 1 x daily - 7 x weekly - 3 sets - 10 reps  ASSESSMENT:  CLINICAL IMPRESSION:  .Progressed core strengthening with cuing to activate core with bridges. Pt has progressed increasing her lumbar ROM as well as her functional endurance. Cues for full ROM needed with leg curls and HS extensions.  Pt is tight in LE and trunk. Pt will continue to benefit from skilled therapy to address pain and weakness.   Patient is a 78 y.o. female who was seen today for physical therapy evaluation and treatment for balance, gait, low back and hip pain. She is ambulating without a device but is very unsteady and shuffles her feet. She was advised to use the [facility] at the least. Patient has a chronic cough that is pretty bad. Her daughter reports she is not complaint with HEP and spends most of the day sitting on the sofa. She presents with LE weakness and balance deficits that put her at a high fall risk. She states that her back and hip pain prevent her from doing anything. Patient is very wobbly when going from sit to stand especially after prolonged sitting. She will benefit from skilled PT to address her gait and balance impairments and decrease her hip and back pain to be more mobile.   OBJECTIVE IMPAIRMENTS: Abnormal gait, cardiopulmonary status limiting activity, decreased activity tolerance, decreased balance, decreased coordination, decreased endurance, decreased mobility, difficulty walking, decreased ROM, decreased strength, postural dysfunction, and pain.   ACTIVITY LIMITATIONS: lifting, bending, sitting, standing, squatting, transfers, and locomotion level  PARTICIPATION LIMITATIONS: meal prep, cleaning, laundry, shopping, and community activity  PERSONAL FACTORS: Age,  Behavior pattern, Past/current experiences, Time since onset of injury/illness/exacerbation, and 3+ comorbidities: CHF, ESRD on dialysis, neuropathy, chronic cough are also affecting patient's functional outcome.   REHAB POTENTIAL: Fair    CLINICAL DECISION MAKING: Stable/uncomplicated  EVALUATION COMPLEXITY: Low   GOALS: Goals reviewed with patient? Yes  SHORT TERM GOALS: Target date: 02/04/24  Patient will be independent with initial HEP. Baseline: given 12/24/23 Goal status: 01/07/24 MET  2.  Patient will demonstrate decreased fall risk by scoring < 15 sec on TUG. Baseline: 24s Goal status: 01/07/24 MET   LONG TERM GOALS: Target date: 03/17/24  Patient will be independent with advanced/ongoing HEP to improve outcomes and carryover.  Baseline:  Goal status: INITIAL  2.  Patient will be able to ambulate 500' with good safety to access community.  Baseline: not using AD, very unsteady and unable to walk or stand more than 2 mins Goal status: INITIAL  3.  Patient will report 50-75% improvement in R hip and low back pain Baseline: 10/10  Goal status: INITIAL   4.  Patient will demonstrate improved functional LE strength as demonstrated by 5xSTS <20s Baseline: 43s elevated mat and UE push off Goal status: Progressing 01/14/24  5.  Patient will increase standing and walking tolerance to 10 mins  Baseline: 2-5 mins  Goal status: INITIAL   PLAN:  PT FREQUENCY: 2x/week  PT DURATION: 12 weeks  PLANNED INTERVENTIONS: 97110-Therapeutic exercises, 97530- Therapeutic activity, 97112- Neuromuscular re-education, 97535- Self Care, 16109- Manual therapy, [phone]- Gait training, [phone]- Electrical stimulation (unattended), Patient/Family education, Balance training, Stair training, Dry Needling, Joint mobilization, Spinal mobilization, Cryotherapy, and Moist heat.  PLAN FOR NEXT SESSION: LE strengthening, balance training, low back and hip stretches . INCREASE HEP "Pt daughter daughter pt  has not been compliant with HEP"   Ollen Beverage, PTA 01/14/2024, 1:51 PM

## 2024-01-15 DIAGNOSIS — Z992 Dependence on renal dialysis: Secondary | ICD-10-CM | POA: Diagnosis not present

## 2024-01-15 DIAGNOSIS — N186 End stage renal disease: Secondary | ICD-10-CM | POA: Diagnosis not present

## 2024-01-15 DIAGNOSIS — N2581 Secondary hyperparathyroidism of renal origin: Secondary | ICD-10-CM | POA: Diagnosis not present

## 2024-01-18 DIAGNOSIS — N186 End stage renal disease: Secondary | ICD-10-CM | POA: Diagnosis not present

## 2024-01-18 DIAGNOSIS — N2581 Secondary hyperparathyroidism of renal origin: Secondary | ICD-10-CM | POA: Diagnosis not present

## 2024-01-18 DIAGNOSIS — Z992 Dependence on renal dialysis: Secondary | ICD-10-CM | POA: Diagnosis not present

## 2024-01-19 ENCOUNTER — Ambulatory Visit: Admitting: Physical Therapy

## 2024-01-20 ENCOUNTER — Other Ambulatory Visit: Payer: Self-pay | Admitting: Family Medicine

## 2024-01-20 ENCOUNTER — Other Ambulatory Visit: Payer: Self-pay

## 2024-01-20 ENCOUNTER — Other Ambulatory Visit (HOSPITAL_COMMUNITY): Payer: Self-pay

## 2024-01-20 DIAGNOSIS — Z992 Dependence on renal dialysis: Secondary | ICD-10-CM | POA: Diagnosis not present

## 2024-01-20 DIAGNOSIS — N2581 Secondary hyperparathyroidism of renal origin: Secondary | ICD-10-CM | POA: Diagnosis not present

## 2024-01-20 DIAGNOSIS — N186 End stage renal disease: Secondary | ICD-10-CM | POA: Diagnosis not present

## 2024-01-20 DIAGNOSIS — R058 Other specified cough: Secondary | ICD-10-CM

## 2024-01-20 DIAGNOSIS — E1149 Type 2 diabetes mellitus with other diabetic neurological complication: Secondary | ICD-10-CM

## 2024-01-20 MED ORDER — AZELASTINE HCL 0.1 % NA SOLN
2.0000 | Freq: Two times a day (BID) | NASAL | 12 refills | Status: DC
  Filled 2024-01-20: qty 30, 25d supply, fill #0
  Filled 2024-02-20: qty 30, 25d supply, fill #1
  Filled 2024-04-05 (×2): qty 30, 25d supply, fill #2
  Filled 2024-05-15: qty 30, 25d supply, fill #3

## 2024-01-20 MED ORDER — FLUTICASONE PROPIONATE 50 MCG/ACT NA SUSP
2.0000 | Freq: Every day | NASAL | 12 refills | Status: DC
  Filled 2024-01-20: qty 16, 30d supply, fill #0
  Filled 2024-02-20: qty 16, 30d supply, fill #1
  Filled 2024-04-05 (×2): qty 16, 30d supply, fill #2
  Filled 2024-04-25 – 2024-04-28 (×2): qty 16, 30d supply, fill #3
  Filled 2024-05-15 – 2024-06-02 (×2): qty 16, 30d supply, fill #4

## 2024-01-20 MED ORDER — ATORVASTATIN CALCIUM 80 MG PO TABS
80.0000 mg | ORAL_TABLET | Freq: Every day | ORAL | 0 refills | Status: DC
  Filled 2024-01-20: qty 90, 90d supply, fill #0

## 2024-01-21 ENCOUNTER — Ambulatory Visit: Admitting: Cardiology

## 2024-01-21 ENCOUNTER — Encounter: Payer: Self-pay | Admitting: Physical Therapy

## 2024-01-21 ENCOUNTER — Ambulatory Visit: Admitting: Physical Therapy

## 2024-01-21 DIAGNOSIS — R262 Difficulty in walking, not elsewhere classified: Secondary | ICD-10-CM | POA: Diagnosis not present

## 2024-01-21 DIAGNOSIS — M5459 Other low back pain: Secondary | ICD-10-CM | POA: Diagnosis not present

## 2024-01-21 DIAGNOSIS — M6281 Muscle weakness (generalized): Secondary | ICD-10-CM

## 2024-01-21 DIAGNOSIS — M25551 Pain in right hip: Secondary | ICD-10-CM

## 2024-01-21 NOTE — Therapy (Signed)
 OUTPATIENT PHYSICAL THERAPY THORACOLUMBAR   Patient Name: Paula [name]  MRN: [id number] DOB:[birth year], 78 y.o., female Today's Date: 01/21/2024  END OF SESSION:  PT End of Session - 01/21/24 1336     Visit Number 6    Date for PT Re-Evaluation 03/17/24    PT Start Time 1336    PT Stop Time 1421    PT Time Calculation (min) 45 min    Activity Tolerance Patient tolerated treatment well    Behavior During Therapy [facility] for tasks assessed/performed          Past Medical History:  Diagnosis Date   Allergic rhinitis    Allergy  1/12000   Arthritis    Asthma    Brain aneurysm    Cough    Diabetes mellitus    Diastolic CHF, chronic (HCC) 10/11/2011   ESRD on hemodialysis (HCC)    MWF   GERD (gastroesophageal reflux disease)    History of colon polyps 2012   tubular adenoma    Hyperlipidemia    Hypertension    Neuropathy 10/11/2011   PONV (postoperative nausea and vomiting)    one time after lymph node surgery   Past Surgical History:  Procedure Laterality Date   AV FISTULA PLACEMENT Right 10/15/2021   Procedure: ARTERIOVENOUS (AV) FISTULA  WITH PLACEMENT OF GORE-TEX STRETCH  GRAFT (4-66mmx45cm);  Surgeon: Dannis Dy, MD;  Location: [facility] OR;  Service: Vascular;  Laterality: Right;   CATARACT EXTRACTION     right eye   IR 3D INDEPENDENT WKST  12/14/2017   IR ANGIO INTRA EXTRACRAN SEL COM CAROTID INNOMINATE BILAT MOD SED  09/15/2017   IR ANGIO INTRA EXTRACRAN SEL COM CAROTID INNOMINATE UNI L MOD SED  12/14/2017   IR ANGIO VERTEBRAL SEL VERTEBRAL BILAT MOD SED  09/15/2017   IR RADIOLOGIST EVAL & MGMT  09/10/2017   IR RADIOLOGIST EVAL & MGMT  10/19/2017   lymphatic mass surgery     NASAL TURBINATE REDUCTION     RADIOLOGY WITH ANESTHESIA N/A 12/14/2017   Procedure: RADIOLOGY WITH ANESTHESIA EMBOLIZATION;  Surgeon: Luellen Sages, MD;  Location: [facility] OR;  Service: Radiology;  Laterality: N/A;   VIDEO BRONCHOSCOPY Bilateral 11/18/2017   Procedure: VIDEO  BRONCHOSCOPY WITHOUT FLUORO;  Surgeon: Denson Flake, MD;  Location: [facility] ENDOSCOPY;  Service: Cardiopulmonary;  Laterality: Bilateral;   Patient Active Problem List   Diagnosis Date Noted   Hypoxemia 12/29/2023   Abnormal electrocardiogram (ECG) (EKG) 12/29/2023   Left ventricular hypertrophy 12/29/2023   CHF (congestive heart failure) (HCC) 10/12/2023   Blurry vision 10/11/2023   Anemia due to chronic kidney disease 10/11/2023   Acute respiratory failure with hypoxia (HCC) 08/17/2023   PNA (pneumonia) 08/17/2023   Volume overload 08/17/2023   Acute hypoxic respiratory failure (HCC) 08/17/2023   SNHL (sensory-neural hearing loss), 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   ESRD on dialysis (HCC) 11/20/2022   Mold exposure 08/14/2022   Opioid withdrawal (HCC) 08/14/2022   ILD (interstitial lung disease) (HCC) 08/14/2022   Secondary hyperparathyroidism, renal (HCC) 08/14/2022   Paroxysmal A-fib (HCC) 05/20/2022   Hypertrophy of nasal turbinates 09/05/2021   Chronic kidney disease, stage 5 (HCC)    Aortic atherosclerosis (HCC) 06/24/2021   Chronic rhinitis 10/01/2017   Seasonal allergic conjunctivitis 10/01/2017   Aneurysm (HCC) 09/24/2017   Acute metabolic encephalopathy 09/02/2017   Allergic rhinitis 08/20/2017   Palpitations 07/10/2016   Gout 01/23/2016   History of cataract 02/27/2015   Seasonal and perennial allergic rhinitis 02/15/2012   Chronic diastolic CHF (congestive heart  failure) (HCC) 10/11/2011   GERD (gastroesophageal reflux disease)    Type 2 diabetes mellitus without complication, without long-term current use of insulin  (HCC) 07/03/2010   Hyperlipidemia 07/03/2010   Essential hypertension 07/03/2010    PCP: Joaquin Mulberry  REFERRING PROVIDER: Joaquin Mulberry  REFERRING DIAG: M54.50,G89.29 (ICD-10-CM) - Chronic right-sided low back pain without sciaticaM25.551 (ICD-10-CM) - Right hip pain  Rationale for Evaluation and Treatment: Rehabilitation  THERAPY DIAG:  Muscle  weakness (generalized)  Difficulty in walking, not elsewhere classified  Pain in right hip  Other low back pain  ONSET DATE: 10/11/23  SUBJECTIVE:                                                                                                                                                                                           SUBJECTIVE STATEMENT: Im feeling tight pt points to her legs to waste  PERTINENT HISTORY:  Paula [name]  is a 78 year old female with  a history of type 2 diabetes mellitus (A1c 76.9), hypertension, hypercholesterolemia, allergic rhinitis, severe upper airway cough syndrome, stage V CKD (managed by Dr [facility], currently on hemodialysis Tuesday, Thursday and Saturday), 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   She experiences pain in the lower back, specifically in the right hip and lower back, which makes standing for more than two minutes difficult. The pain is localized to the hip area without radiation down the leg. She is receiving physical therapy at home and is interested in outpatient therapy. Tylenol  and a heating pad provide pain relief. Hip x-ray from 2024 revealed degenerative changes send pubic symphysis, both hips, SI joints and lower lumbar spine.  She fell from her bed last Monday, resulting in pain on the left side of her head and right shoulder, which was swollen but has improved. No current swelling or pain with pressure. She experiences dizziness at times and uses a cane occasionally.    PAIN:  Are you having pain? Yes: NPRS scale: 0/10 now, 5/10 when she stands for a while Pain location: R hip and midline low back  Pain description: not there all the time, if I am sitting I am okay Aggravating factors: when I get up, when I am standing,  walking  Relieving factors: Tylenol  and gabapentin   PRECAUTIONS: Fall  RED FLAGS: None   WEIGHT BEARING RESTRICTIONS: No  FALLS:  Has patient fallen in last 6 months? Yes. Number of falls 2 knee gave out in March, then  1 time fell out of the bed   LIVING ENVIRONMENT: Lives with: lives with their family Lives in: House/apartment Stairs: Yes: Internal: 14 steps; on right going up and the wall on the other side Has following equipment at home: Single point cane and Walker - 2 wheeled  OCCUPATION: N/A  PLOF: Independent with basic ADLs  PATIENT GOALS: to get rid of the pain, be able to walk and stand for longer than 2 mins without the pain  NEXT MD VISIT: 03/01/24  OBJECTIVE:  Note: Objective measures were completed at Evaluation unless otherwise noted.  DIAGNOSTIC FINDINGS:  X ray ordered for hips, lumbar, and knees   COGNITION: Overall cognitive status: Within functional limits for tasks assessed     SENSATION: WFL  POSTURE: rounded shoulders, forward head, and increased thoracic kyphosis  PALPATION: TTP and pain lumbar spine   LUMBAR ROM:   AROM eval 01/14/24 01/21/24  Flexion Distal shin with pain WFL   Extension 25% available Limited 25% Limited 25%  Right lateral flexion Mid thigh with pain Limited 25% Limited 25%  Left lateral flexion Mid thigh with pain WFL   Right rotation 75% available with pain WFL   Left rotation 75% available with pain WFL    (Blank rows = not tested)  LOWER EXTREMITY ROM:   grossly WFL   LOWER EXTREMITY MMT:  grossly 4-/5 BLE    FUNCTIONAL TESTS:  5 times sit to stand: 43s from elevated mat and needing UE push off, loses balance on to heels  Timed up and go (TUG): 24s   GAIT: Distance walked: in clinic distances Assistive device utilized: None Level of assistance: CGA Comments: furniture walking, or reaches out to grab daughter's hand to walk  TREATMENT DATE:  01/21/24 NuStep L5 x 6 min Goals  Lumbar ROM 5x S2S 17.77 LE  did push against table on first rep HS curls 20lb 2x10 Leg Ext 10lb 2x10 Shoulder Ext 5lb 2x10 Bridges x10 Feet on ball bridge, obl and HS curl PROM BIL LE and trunk- tightness RT> left.  HS, piriformis, Double and single K2C   STM to R ITB  01/14/24 NuStep L5 x 6 min Goals  Lumbar ORM 5x S2S 19.54 LE did push against table on first rep  Gait three laps ~ 357ft some fatigue and instability at times  HS curls 20lb 2x10 Leg Ext 10lb 2x10 Feet on ball bridge, obl and HS curl PROM BIL LE and trunk- tightness RT> left. RT IT tenderness  HS, piriformis, Double and single [facility]  01/12/24 Nustep L5  6 min 20lb resisted side steps x5 Hip add ball squeeze 2x10 Hip abd green 2x10 Sit to stand 2x10 Feet on ball bridge, obl and HS curl PROM BIL LE and trunk- tightness RT> left. RT IT tenderness  HS, piriformis, Double and single [facility]   01/07/24 Nustep L 4 6 min Standing red tband shld ext and row 2 sets 10 Standing red tband hip ext and abd 10 xeach Seated trunk ext 2 sets 10 green tband STS 2 sets 5 with wt ball chest press Seated clams and hip flex 2 sets 10 Seated ADD with ball squeeze PROM BIL LE and trunk- tightness RT> left. RT IT tenderness Feet on ball bridge, obl and HS curl Isometric abdominals   12/31/23 Nustep L 3 Feet on ball bridge, KTC and obl 10 x each Isometric abdominals 10 x hold 3 sec ADD ball squeeze 10 x then 10 x with bridge Red tband clams  2 sets 10  Red tband marching 2 sets 10 PROM BIL LE And trunk- very tight STS 5x with UE and the elevated mat without UE 5 x Seated row and shld ext 2 sets 10 Seated trunk ext 2 sets 10 green tband     12/24/23- EVAL, HEP                                                                                                                                 PATIENT EDUCATION:  Education details: POC, HEP, importance of movement Person educated: Patient and Child(ren) Education method: Software engineer Education comprehension: verbalized understanding  HOME EXERCISE PROGRAM: Access Code: [email or web address] URL: [email or web address] Date: 01/12/2024 Prepared by: Towanda Fret  Exercises - Supine Lower Trunk Rotation  - 1 x daily - 7 x weekly - 2 sets - 10 reps - Seated March  - 1 x daily - 7 x weekly - 2 sets - 10 reps - Seated Hip Abduction with Resistance  - 1 x daily - 7 x weekly - 2 sets - 10 reps - Sit to Stand  - 1 x daily - 7 x weekly - 2 sets - 5 reps - Supine Bridge  - 1 x daily - 7 x weekly - 3 sets - 10 reps - Standing Hip Abduction with Counter Support  - 1 x daily - 7 x weekly - 3 sets - 10 reps  ASSESSMENT:  CLINICAL IMPRESSION:  .Progressed core strengthening with cuing to activate core with bridges. No improvement with lumbar ROM but it remains well overall . Cues for full ROM needed with leg curls and HS extensions.  Pt is tight in LE and trunk, improved ITB tightness on R side with STM.  Pt will continue to benefit from skilled therapy to address pain and weakness.   Patient is a 78 y.o. female who was seen today for physical therapy evaluation and treatment for balance, gait, low back and hip pain. She is ambulating without a device but is very unsteady and shuffles her feet. She was advised to use the [facility] at the least. Patient has a chronic cough that is pretty bad. Her daughter reports she is not complaint with HEP and spends most of the day sitting on the sofa. She presents with LE weakness and balance deficits that put her at a high fall risk. She states that her back and hip pain prevent her from doing anything. Patient is very wobbly when going from sit to stand especially after prolonged sitting. She will benefit from skilled PT to address her gait and balance impairments and decrease her hip and back pain to be more mobile.   OBJECTIVE IMPAIRMENTS: Abnormal gait, cardiopulmonary status limiting activity, decreased activity tolerance, decreased  balance, decreased coordination, decreased endurance, decreased mobility, difficulty walking, decreased ROM, decreased strength, postural dysfunction, and pain.   ACTIVITY LIMITATIONS: lifting, bending, sitting, standing, squatting, transfers, and locomotion level  PARTICIPATION  LIMITATIONS: meal prep, cleaning, laundry, shopping, and community activity  PERSONAL FACTORS: Age, Behavior pattern, Past/current experiences, Time since onset of injury/illness/exacerbation, and 3+ comorbidities: CHF, ESRD on dialysis, neuropathy, chronic cough are also affecting patient's functional outcome.   REHAB POTENTIAL: Fair    CLINICAL DECISION MAKING: Stable/uncomplicated  EVALUATION COMPLEXITY: Low   GOALS: Goals reviewed with patient? Yes  SHORT TERM GOALS: Target date: 02/04/24  Patient will be independent with initial HEP. Baseline: given 12/24/23 Goal status: 01/07/24 MET  2.  Patient will demonstrate decreased fall risk by scoring < 15 sec on TUG. Baseline: 24s Goal status: 01/07/24 MET   LONG TERM GOALS: Target date: 03/17/24  Patient will be independent with advanced/ongoing HEP to improve outcomes and carryover.  Baseline:  Goal status: INITIAL  2.  Patient will be able to ambulate 500' with good safety to access community.  Baseline: not using AD, very unsteady and unable to walk or stand more than 2 mins Goal status: INITIAL  3.  Patient will report 50-75% improvement in R hip and low back pain Baseline: 10/10  Goal status: Progressing 7/10   4.  Patient will demonstrate improved functional LE strength as demonstrated by 5xSTS <20s Baseline: 43s elevated mat and UE push off Goal status: Progressing 01/14/24, Progressing 01/21/24  5.  Patient will increase standing and walking tolerance to 10 mins  Baseline: 2-5 mins  Goal status: INITIAL   PLAN:  PT FREQUENCY: 2x/week  PT DURATION: 12 weeks  PLANNED INTERVENTIONS: 97110-Therapeutic exercises, 97530- Therapeutic activity,  97112- Neuromuscular re-education, 97535- Self Care, [id number]- Manual therapy, [phone]- Gait training, [phone]- Electrical stimulation (unattended), Patient/Family education, Balance training, Stair training, Dry Needling, Joint mobilization, Spinal mobilization, Cryotherapy, and Moist heat.  PLAN FOR NEXT SESSION: LE strengthening, balance training, low back and hip stretches . INCREASE HEP Pt daughter daughter pt has not been compliant with HEP   Ollen Beverage, PTA 01/21/2024, 1:37 PM

## 2024-01-22 DIAGNOSIS — N2581 Secondary hyperparathyroidism of renal origin: Secondary | ICD-10-CM | POA: Diagnosis not present

## 2024-01-22 DIAGNOSIS — Z992 Dependence on renal dialysis: Secondary | ICD-10-CM | POA: Diagnosis not present

## 2024-01-22 DIAGNOSIS — N186 End stage renal disease: Secondary | ICD-10-CM | POA: Diagnosis not present

## 2024-01-25 ENCOUNTER — Telehealth: Payer: Self-pay | Admitting: Endocrinology

## 2024-01-25 DIAGNOSIS — N2581 Secondary hyperparathyroidism of renal origin: Secondary | ICD-10-CM | POA: Diagnosis not present

## 2024-01-25 DIAGNOSIS — Z992 Dependence on renal dialysis: Secondary | ICD-10-CM | POA: Diagnosis not present

## 2024-01-25 DIAGNOSIS — N186 End stage renal disease: Secondary | ICD-10-CM | POA: Diagnosis not present

## 2024-01-25 NOTE — Telephone Encounter (Signed)
 Patient's daughter, Laban Pia, is calling to say that the linagliptin  (TRADJENTA ) 5 MG TABS tablet is working for her mother very well.

## 2024-01-26 ENCOUNTER — Ambulatory Visit: Admitting: Endocrinology

## 2024-01-27 ENCOUNTER — Telehealth: Payer: Self-pay | Admitting: Cardiology

## 2024-01-27 DIAGNOSIS — I517 Cardiomegaly: Secondary | ICD-10-CM | POA: Diagnosis not present

## 2024-01-27 DIAGNOSIS — R9431 Abnormal electrocardiogram [ECG] [EKG]: Secondary | ICD-10-CM | POA: Diagnosis not present

## 2024-01-27 DIAGNOSIS — N2581 Secondary hyperparathyroidism of renal origin: Secondary | ICD-10-CM | POA: Diagnosis not present

## 2024-01-27 DIAGNOSIS — N186 End stage renal disease: Secondary | ICD-10-CM | POA: Diagnosis not present

## 2024-01-27 DIAGNOSIS — N185 Chronic kidney disease, stage 5: Secondary | ICD-10-CM | POA: Diagnosis not present

## 2024-01-27 DIAGNOSIS — Z992 Dependence on renal dialysis: Secondary | ICD-10-CM | POA: Diagnosis not present

## 2024-01-27 NOTE — Telephone Encounter (Signed)
 Spoke with pt who reports she has collected her 24 hr urine specimen and wanted to know if she could wait to return it to the lab until tomorrow.  Advised would be best to return it today.  She states understanding and will take it to her closest [place].

## 2024-01-27 NOTE — Therapy (Signed)
 OUTPATIENT PHYSICAL THERAPY THORACOLUMBAR   Patient Name: Paula [name]  MRN: [id number] DOB:1946/06/09, 78 y.o., female Today's Date: 01/28/2024  END OF SESSION:  PT End of Session - 01/28/24 1237     Visit Number 7    Date for PT Re-Evaluation 03/17/24    PT Start Time 1237    PT Stop Time 1315    PT Time Calculation (min) 38 min    Activity Tolerance Patient tolerated treatment well    Behavior During Therapy [facility] for tasks assessed/performed           Past Medical History:  Diagnosis Date   Allergic rhinitis    Allergy  1/12000   Arthritis    Asthma    Brain aneurysm    Cough    Diabetes mellitus    Diastolic CHF, chronic (HCC) 10/11/2011   ESRD on hemodialysis (HCC)    MWF   GERD (gastroesophageal reflux disease)    History of colon polyps 2012   tubular adenoma    Hyperlipidemia    Hypertension    Neuropathy 10/11/2011   PONV (postoperative nausea and vomiting)    one time after lymph node surgery   Past Surgical History:  Procedure Laterality Date   AV FISTULA PLACEMENT Right 10/15/2021   Procedure: ARTERIOVENOUS (AV) FISTULA  WITH PLACEMENT OF GORE-TEX STRETCH  GRAFT (4-56mmx45cm);  Surgeon: Dannis Dy, MD;  Location: [facility] OR;  Service: Vascular;  Laterality: Right;   CATARACT EXTRACTION     right eye   IR 3D INDEPENDENT WKST  12/14/2017   IR ANGIO INTRA EXTRACRAN SEL COM CAROTID INNOMINATE BILAT MOD SED  09/15/2017   IR ANGIO INTRA EXTRACRAN SEL COM CAROTID INNOMINATE UNI L MOD SED  12/14/2017   IR ANGIO VERTEBRAL SEL VERTEBRAL BILAT MOD SED  09/15/2017   IR RADIOLOGIST EVAL & MGMT  09/10/2017   IR RADIOLOGIST EVAL & MGMT  10/19/2017   lymphatic mass surgery     NASAL TURBINATE REDUCTION     RADIOLOGY WITH ANESTHESIA N/A 12/14/2017   Procedure: RADIOLOGY WITH ANESTHESIA EMBOLIZATION;  Surgeon: Luellen Sages, MD;  Location: [facility] OR;  Service: Radiology;  Laterality: N/A;   VIDEO BRONCHOSCOPY Bilateral 11/18/2017   Procedure: VIDEO  BRONCHOSCOPY WITHOUT FLUORO;  Surgeon: Denson Flake, MD;  Location: [facility] ENDOSCOPY;  Service: Cardiopulmonary;  Laterality: Bilateral;   Patient Active Problem List   Diagnosis Date Noted   Hypoxemia 12/29/2023   Abnormal electrocardiogram (ECG) (EKG) 12/29/2023   Left ventricular hypertrophy 12/29/2023   CHF (congestive heart failure) (HCC) 10/12/2023   Blurry vision 10/11/2023   Anemia due to chronic kidney disease 10/11/2023   Acute respiratory failure with hypoxia (HCC) 08/17/2023   PNA (pneumonia) 08/17/2023   Volume overload 08/17/2023   Acute hypoxic respiratory failure (HCC) 08/17/2023   SNHL (sensory-neural hearing loss), 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   ESRD on dialysis (HCC) 11/20/2022   Mold exposure 08/14/2022   Opioid withdrawal (HCC) 08/14/2022   ILD (interstitial lung disease) (HCC) 08/14/2022   Secondary hyperparathyroidism, renal (HCC) 08/14/2022   Paroxysmal A-fib (HCC) 05/20/2022   Hypertrophy of nasal turbinates 09/05/2021   Chronic kidney disease, stage 5 (HCC)    Aortic atherosclerosis (HCC) 06/24/2021   Chronic rhinitis 10/01/2017   Seasonal allergic conjunctivitis 10/01/2017   Aneurysm (HCC) 09/24/2017   Acute metabolic encephalopathy 09/02/2017   Allergic rhinitis 08/20/2017   Palpitations 07/10/2016   Gout 01/23/2016   History of cataract 02/27/2015   Seasonal and perennial allergic rhinitis 02/15/2012   Chronic diastolic CHF (congestive  heart failure) (HCC) 10/11/2011   GERD (gastroesophageal reflux disease)    Type 2 diabetes mellitus without complication, without long-term current use of insulin  (HCC) 07/03/2010   Hyperlipidemia 07/03/2010   Essential hypertension 07/03/2010    PCP: Joaquin Mulberry  REFERRING PROVIDER: Joaquin Mulberry  REFERRING DIAG: M54.50,G89.29 (ICD-10-CM) - Chronic right-sided low back pain without sciaticaM25.551 (ICD-10-CM) - Right hip pain  Rationale for Evaluation and Treatment: Rehabilitation  THERAPY DIAG:  Muscle  weakness (generalized)  Difficulty in walking, not elsewhere classified  Pain in right hip  Other low back pain  ONSET DATE: 10/11/23  SUBJECTIVE:                                                                                                                                                                                           SUBJECTIVE STATEMENT: I am feeling good. The same thing with the back, it comes and goes.   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   She experiences pain in the lower back, specifically in the right hip and lower back, which makes standing for more than two minutes difficult. The pain is localized to the hip area without radiation down the leg. She is receiving physical therapy at home and is interested in outpatient therapy. Tylenol  and a heating pad provide pain relief. Hip x-ray from 2024 revealed degenerative changes send pubic symphysis, both hips, SI joints and lower lumbar spine.  She fell from her bed last Monday, resulting in pain on the left side of her head and right shoulder, which was swollen but has improved. No current swelling or pain with pressure. She experiences dizziness at times and uses a cane occasionally.    PAIN:  Are you having pain? Yes: NPRS scale: 0/10 now, 5/10 when she stands for a while Pain location: R hip and midline low back  Pain description: not there all the time, if I am sitting I am okay Aggravating factors: when I get up,  when I am standing, walking  Relieving factors: Tylenol  and gabapentin   PRECAUTIONS: Fall  RED FLAGS: None   WEIGHT BEARING RESTRICTIONS: No  FALLS:  Has patient fallen in last 6 months? Yes. Number of falls 2  knee gave out in March, then 1 time fell out of the bed   LIVING ENVIRONMENT: Lives with: lives with their family Lives in: House/apartment Stairs: Yes: Internal: 14 steps; on right going up and the wall on the other side Has following equipment at home: Single point cane and Walker - 2 wheeled  OCCUPATION: N/A  PLOF: Independent with basic ADLs  PATIENT GOALS: to get rid of the pain, be able to walk and stand for longer than 2 mins without the pain  NEXT MD VISIT: 03/01/24  OBJECTIVE:  Note: Objective measures were completed at Evaluation unless otherwise noted.  DIAGNOSTIC FINDINGS:  X ray ordered for hips, lumbar, and knees   COGNITION: Overall cognitive status: Within functional limits for tasks assessed     SENSATION: WFL  POSTURE: rounded shoulders, forward head, and increased thoracic kyphosis  PALPATION: TTP and pain lumbar spine   LUMBAR ROM:   AROM eval 01/14/24 01/21/24  Flexion Distal shin with pain WFL   Extension 25% available Limited 25% Limited 25%  Right lateral flexion Mid thigh with pain Limited 25% Limited 25%  Left lateral flexion Mid thigh with pain WFL   Right rotation 75% available with pain WFL   Left rotation 75% available with pain WFL    (Blank rows = not tested)  LOWER EXTREMITY ROM:   grossly WFL   LOWER EXTREMITY MMT:  grossly 4-/5 BLE    FUNCTIONAL TESTS:  5 times sit to stand: 43s from elevated mat and needing UE push off, loses balance on to heels  Timed up and go (TUG): 24s   GAIT: Distance walked: in clinic distances Assistive device utilized: None Level of assistance: CGA Comments: furniture walking, or reaches out to grab daughter's hand to walk  TREATMENT DATE:  01/28/24 NuStep L5x37mins  STS on airex  2x10 HS curls 20lb 2x10 Leg Ext 10lb 2x10 Shoulder ext and rows green band 2x10 Passive stretching- HS, SKTC, LTR     01/21/24 NuStep L5 x 6 min Goals  Lumbar ROM 5x S2S 17.77 LE did push against table on first rep HS curls 20lb 2x10 Leg Ext 10lb 2x10 Shoulder Ext 5lb 2x10 Bridges x10 Feet on ball bridge, obl and HS curl PROM BIL LE and trunk- tightness RT> left.  HS, piriformis, Double and single K2C   STM to R ITB  01/14/24 NuStep L5 x 6 min Goals  Lumbar ORM 5x S2S 19.54 LE did push against table on first rep  Gait three laps ~ 34ft some fatigue and instability at times  HS curls 20lb 2x10 Leg Ext 10lb 2x10 Feet on ball bridge, obl and HS curl PROM BIL LE and trunk- tightness RT> left. RT IT tenderness  HS, piriformis, Double and single [facility]  01/12/24 Nustep L5  6 min 20lb resisted side steps x5 Hip add ball squeeze 2x10 Hip abd green 2x10 Sit to stand 2x10 Feet on ball bridge, obl and HS curl PROM BIL LE and trunk- tightness RT> left. RT IT tenderness  HS, piriformis, Double and single [facility]   01/07/24 Nustep L 4 6 min Standing red tband shld ext and row 2 sets 10 Standing red tband hip ext and abd 10 xeach Seated trunk ext 2 sets 10 green tband STS 2 sets 5 with wt ball chest press Seated clams and hip flex 2 sets 10 Seated ADD with ball squeeze PROM BIL LE and trunk- tightness RT> left. RT IT tenderness Feet on ball bridge, obl and HS curl Isometric  abdominals   12/31/23 Nustep L 3 Feet on ball bridge, KTC and obl 10 x each Isometric abdominals 10 x hold 3 sec ADD ball squeeze 10 x then 10 x with bridge Red tband clams 2 sets 10  Red tband marching 2 sets 10 PROM BIL LE And trunk- very tight STS 5x with UE and the elevated mat without UE 5 x Seated row and shld ext 2 sets 10 Seated trunk ext 2 sets 10 green tband     12/24/23- EVAL, HEP                                                                                                                                  PATIENT EDUCATION:  Education details: POC, HEP, importance of movement Person educated: Patient and Child(ren) Education method: Medical illustrator Education comprehension: verbalized understanding  HOME EXERCISE PROGRAM: Access Code: [email or web address] URL: [email or web address] Date: 01/12/2024 Prepared by: Towanda Fret  Exercises - Supine Lower Trunk Rotation  - 1 x daily - 7 x weekly - 2 sets - 10 reps - Seated March  - 1 x daily - 7 x weekly - 2 sets - 10 reps - Seated Hip Abduction with Resistance  - 1 x daily - 7 x weekly - 2 sets - 10 reps - Sit to Stand  - 1 x daily - 7 x weekly - 2 sets - 5 reps - Supine Bridge  - 1 x daily - 7 x weekly - 3 sets - 10 reps - Standing Hip Abduction with Counter Support  - 1 x daily - 7 x weekly - 3 sets - 10 reps  ASSESSMENT:  CLINICAL IMPRESSION:  patient doing well, arrives a few minutes late. Some ongoing pain in back but it is less frequent now. She states the stretching is good and she tries to do a little bit at home. Pt will continue to benefit from skilled therapy to address pain and weakness.   Patient is a 78 y.o. female who was seen today for physical therapy evaluation and treatment for balance, gait, low back and hip pain. She is ambulating without a device but is very unsteady and shuffles her feet. She was advised to use the [facility] at the least. Patient has a chronic cough that is pretty bad. Her daughter reports she is not complaint with HEP and spends most of the day sitting on the sofa. She presents with LE weakness and balance deficits that put her at a high fall risk. She states that her back and hip pain prevent her from doing anything. Patient is very wobbly when going from sit to stand especially after prolonged sitting. She will benefit from skilled PT to address her gait and balance impairments and decrease her hip and back pain to be more mobile.   OBJECTIVE IMPAIRMENTS: Abnormal  gait, cardiopulmonary status limiting activity, decreased activity tolerance, decreased balance, decreased coordination, decreased endurance, decreased mobility, difficulty  walking, decreased ROM, decreased strength, postural dysfunction, and pain.   ACTIVITY LIMITATIONS: lifting, bending, sitting, standing, squatting, transfers, and locomotion level  PARTICIPATION LIMITATIONS: meal prep, cleaning, laundry, shopping, and community activity  PERSONAL FACTORS: Age, Behavior pattern, Past/current experiences, Time since onset of injury/illness/exacerbation, and 3+ comorbidities: CHF, ESRD on dialysis, neuropathy, chronic cough are also affecting patient's functional outcome.   REHAB POTENTIAL: Fair    CLINICAL DECISION MAKING: Stable/uncomplicated  EVALUATION COMPLEXITY: Low   GOALS: Goals reviewed with patient? Yes  SHORT TERM GOALS: Target date: 02/04/24  Patient will be independent with initial HEP. Baseline: given 12/24/23 Goal status: 01/07/24 MET  2.  Patient will demonstrate decreased fall risk by scoring < 15 sec on TUG. Baseline: 24s Goal status: 01/07/24 MET   LONG TERM GOALS: Target date: 03/17/24  Patient will be independent with advanced/ongoing HEP to improve outcomes and carryover.  Baseline:  Goal status: INITIAL  2.  Patient will be able to ambulate 500' with good safety to access community.  Baseline: not using AD, very unsteady and unable to walk or stand more than 2 mins Goal status: INITIAL  3.  Patient will report 50-75% improvement in R hip and low back pain Baseline: 10/10  Goal status: Progressing 7/10   4.  Patient will demonstrate improved functional LE strength as demonstrated by 5xSTS <20s Baseline: 43s elevated mat and UE push off Goal status: Progressing 01/14/24, Progressing 01/21/24  5.  Patient will increase standing and walking tolerance to 10 mins  Baseline: 2-5 mins  Goal status: INITIAL   PLAN:  PT FREQUENCY: 2x/week  PT DURATION: 12  weeks  PLANNED INTERVENTIONS: 97110-Therapeutic exercises, 97530- Therapeutic activity, 97112- Neuromuscular re-education, 97535- Self Care, [id number]- Manual therapy, [phone]- Gait training, [phone]- Electrical stimulation (unattended), Patient/Family education, Balance training, Stair training, Dry Needling, Joint mobilization, Spinal mobilization, Cryotherapy, and Moist heat.  PLAN FOR NEXT SESSION: LE strengthening, balance training, low back and hip stretches . INCREASE HEP Pt daughter pt has not been compliant with HEP   Donavon Fudge, PT 01/28/2024, 1:19 PM

## 2024-01-27 NOTE — Telephone Encounter (Signed)
 Patient is calling stating she was able to collect her specimen yesterday at 3 pm, but has not brought it by the office yet. She is wanting to know if she has to bring it in by 3 pm today for it to still be good, or if she can wait until tomorrow morning to bring it.   Please advise.

## 2024-01-28 ENCOUNTER — Other Ambulatory Visit (HOSPITAL_COMMUNITY): Payer: Self-pay

## 2024-01-28 ENCOUNTER — Ambulatory Visit

## 2024-01-28 ENCOUNTER — Encounter (HOSPITAL_COMMUNITY): Payer: Self-pay

## 2024-01-28 DIAGNOSIS — H524 Presbyopia: Secondary | ICD-10-CM | POA: Diagnosis not present

## 2024-01-28 DIAGNOSIS — M6281 Muscle weakness (generalized): Secondary | ICD-10-CM

## 2024-01-28 DIAGNOSIS — H35033 Hypertensive retinopathy, bilateral: Secondary | ICD-10-CM | POA: Diagnosis not present

## 2024-01-28 DIAGNOSIS — R262 Difficulty in walking, not elsewhere classified: Secondary | ICD-10-CM

## 2024-01-28 DIAGNOSIS — H35361 Drusen (degenerative) of macula, right eye: Secondary | ICD-10-CM | POA: Diagnosis not present

## 2024-01-28 DIAGNOSIS — M5459 Other low back pain: Secondary | ICD-10-CM

## 2024-01-28 DIAGNOSIS — E113293 Type 2 diabetes mellitus with mild nonproliferative diabetic retinopathy without macular edema, bilateral: Secondary | ICD-10-CM | POA: Diagnosis not present

## 2024-01-28 DIAGNOSIS — M25551 Pain in right hip: Secondary | ICD-10-CM

## 2024-01-28 DIAGNOSIS — H35371 Puckering of macula, right eye: Secondary | ICD-10-CM | POA: Diagnosis not present

## 2024-01-28 LAB — HM DIABETES EYE EXAM

## 2024-01-29 ENCOUNTER — Other Ambulatory Visit: Payer: Self-pay

## 2024-01-29 ENCOUNTER — Other Ambulatory Visit (HOSPITAL_COMMUNITY): Payer: Self-pay

## 2024-01-29 DIAGNOSIS — N2581 Secondary hyperparathyroidism of renal origin: Secondary | ICD-10-CM | POA: Diagnosis not present

## 2024-01-29 DIAGNOSIS — Z992 Dependence on renal dialysis: Secondary | ICD-10-CM | POA: Diagnosis not present

## 2024-01-29 DIAGNOSIS — N186 End stage renal disease: Secondary | ICD-10-CM | POA: Diagnosis not present

## 2024-01-30 ENCOUNTER — Encounter (HOSPITAL_COMMUNITY): Payer: Self-pay

## 2024-01-30 ENCOUNTER — Other Ambulatory Visit (HOSPITAL_COMMUNITY): Payer: Self-pay

## 2024-01-30 LAB — UPEP/UIFE/LIGHT CHAINS/TP, 24-HR UR: Kappa/Lambda Ratio,U: 2.2 (ref 1.83–14.26)

## 2024-02-01 ENCOUNTER — Encounter: Payer: Self-pay | Admitting: Cardiology

## 2024-02-01 DIAGNOSIS — N186 End stage renal disease: Secondary | ICD-10-CM | POA: Diagnosis not present

## 2024-02-01 DIAGNOSIS — N2581 Secondary hyperparathyroidism of renal origin: Secondary | ICD-10-CM | POA: Diagnosis not present

## 2024-02-01 DIAGNOSIS — Z992 Dependence on renal dialysis: Secondary | ICD-10-CM | POA: Diagnosis not present

## 2024-02-02 ENCOUNTER — Ambulatory Visit: Admitting: Acute Care

## 2024-02-03 DIAGNOSIS — Z992 Dependence on renal dialysis: Secondary | ICD-10-CM | POA: Diagnosis not present

## 2024-02-03 DIAGNOSIS — N2581 Secondary hyperparathyroidism of renal origin: Secondary | ICD-10-CM | POA: Diagnosis not present

## 2024-02-03 DIAGNOSIS — N186 End stage renal disease: Secondary | ICD-10-CM | POA: Diagnosis not present

## 2024-02-04 ENCOUNTER — Other Ambulatory Visit (HOSPITAL_COMMUNITY): Payer: Self-pay

## 2024-02-05 DIAGNOSIS — Z992 Dependence on renal dialysis: Secondary | ICD-10-CM | POA: Diagnosis not present

## 2024-02-05 DIAGNOSIS — N2581 Secondary hyperparathyroidism of renal origin: Secondary | ICD-10-CM | POA: Diagnosis not present

## 2024-02-05 DIAGNOSIS — N186 End stage renal disease: Secondary | ICD-10-CM | POA: Diagnosis not present

## 2024-02-06 ENCOUNTER — Encounter (HOSPITAL_COMMUNITY): Payer: Self-pay | Admitting: Interventional Radiology

## 2024-02-08 DIAGNOSIS — Z992 Dependence on renal dialysis: Secondary | ICD-10-CM | POA: Diagnosis not present

## 2024-02-08 DIAGNOSIS — N186 End stage renal disease: Secondary | ICD-10-CM | POA: Diagnosis not present

## 2024-02-08 DIAGNOSIS — N2581 Secondary hyperparathyroidism of renal origin: Secondary | ICD-10-CM | POA: Diagnosis not present

## 2024-02-08 DIAGNOSIS — I129 Hypertensive chronic kidney disease with stage 1 through stage 4 chronic kidney disease, or unspecified chronic kidney disease: Secondary | ICD-10-CM | POA: Diagnosis not present

## 2024-02-09 ENCOUNTER — Encounter: Admitting: Physical Therapy

## 2024-02-09 DIAGNOSIS — N186 End stage renal disease: Secondary | ICD-10-CM | POA: Diagnosis not present

## 2024-02-09 DIAGNOSIS — N2581 Secondary hyperparathyroidism of renal origin: Secondary | ICD-10-CM | POA: Diagnosis not present

## 2024-02-09 DIAGNOSIS — Z992 Dependence on renal dialysis: Secondary | ICD-10-CM | POA: Diagnosis not present

## 2024-02-10 DIAGNOSIS — N186 End stage renal disease: Secondary | ICD-10-CM | POA: Diagnosis not present

## 2024-02-10 DIAGNOSIS — Z992 Dependence on renal dialysis: Secondary | ICD-10-CM | POA: Diagnosis not present

## 2024-02-10 DIAGNOSIS — N2581 Secondary hyperparathyroidism of renal origin: Secondary | ICD-10-CM | POA: Diagnosis not present

## 2024-02-10 LAB — MULTIPLE MYELOMA PANEL, SERUM

## 2024-02-10 LAB — UPEP/UIFE/LIGHT CHAINS/TP, 24-HR UR
% BETA, Urine: 21 %
ALBUMIN, U: 45.9 %
ALPHA 1 URINE: 7.3 %
ALPHA-2-GLOBULIN, U: 11.7 %
Free Lambda Lt Chains,Ur: 62.81 mg/L — ABNORMAL HIGH (ref 0.27–15.21)
GAMMA GLOBULIN URINE: 14.2 %
Kappa/Lambda Ratio,U: 2.2 (ref 1.83–14.26)
NOTE:: 137.93 mg/L — ABNORMAL HIGH (ref 1.17–86.46)
Protein, 24H Urine: 263 mg/(24.h) — ABNORMAL HIGH (ref 30–150)
Protein, Ur: 131.5 mg/dL

## 2024-02-10 NOTE — Therapy (Incomplete)
 OUTPATIENT PHYSICAL THERAPY THORACOLUMBAR   Patient Name: Paula [name]  MRN: [id number] DOB:[birth year], 78 y.o., female Today's Date: 02/10/2024  END OF SESSION:     Past Medical History:  Diagnosis Date   Allergic rhinitis    Allergy  1/12000   Arthritis    Asthma    Brain aneurysm    Cough    Diabetes mellitus    Diastolic CHF, chronic (HCC) 10/11/2011   ESRD on hemodialysis (HCC)    MWF   GERD (gastroesophageal reflux disease)    History of colon polyps 2012   tubular adenoma    Hyperlipidemia    Hypertension    Neuropathy 10/11/2011   PONV (postoperative nausea and vomiting)    one time after lymph node surgery   Past Surgical History:  Procedure Laterality Date   AV FISTULA PLACEMENT Right 10/15/2021   Procedure: ARTERIOVENOUS (AV) FISTULA  WITH PLACEMENT OF GORE-TEX STRETCH  GRAFT (4-76mmx45cm);  Surgeon: Eliza Lonni RAMAN, MD;  Location: [facility] OR;  Service: Vascular;  Laterality: Right;   CATARACT EXTRACTION     right eye   IR 3D INDEPENDENT WKST  12/14/2017   IR ANGIO INTRA EXTRACRAN SEL COM CAROTID INNOMINATE BILAT MOD SED  09/15/2017   IR ANGIO INTRA EXTRACRAN SEL COM CAROTID INNOMINATE UNI L MOD SED  12/14/2017   IR ANGIO VERTEBRAL SEL VERTEBRAL BILAT MOD SED  09/15/2017   IR RADIOLOGIST EVAL & MGMT  09/10/2017   IR RADIOLOGIST EVAL & MGMT  10/19/2017   lymphatic mass surgery     NASAL TURBINATE REDUCTION     RADIOLOGY WITH ANESTHESIA N/A 12/14/2017   Procedure: RADIOLOGY WITH ANESTHESIA EMBOLIZATION;  Surgeon: Dolphus Carrion, MD;  Location: [facility] OR;  Service: Radiology;  Laterality: N/A;   VIDEO BRONCHOSCOPY Bilateral 11/18/2017   Procedure: VIDEO BRONCHOSCOPY WITHOUT FLUORO;  Surgeon: Shelah Lamar RAMAN, MD;  Location: [facility] ENDOSCOPY;  Service: Cardiopulmonary;  Laterality: Bilateral;   Patient Active Problem List   Diagnosis Date Noted   Hypoxemia 12/29/2023   Abnormal electrocardiogram (ECG) (EKG) 12/29/2023   Left ventricular hypertrophy  12/29/2023   CHF (congestive heart failure) (HCC) 10/12/2023   Blurry vision 10/11/2023   Anemia due to chronic kidney disease 10/11/2023   Acute respiratory failure with hypoxia (HCC) 08/17/2023   PNA (pneumonia) 08/17/2023   Volume overload 08/17/2023   Acute hypoxic respiratory failure (HCC) 08/17/2023   SNHL (sensory-neural hearing loss), 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   ESRD on dialysis (HCC) 11/20/2022   Mold exposure 08/14/2022   Opioid withdrawal (HCC) 08/14/2022   ILD (interstitial lung disease) (HCC) 08/14/2022   Secondary hyperparathyroidism, renal (HCC) 08/14/2022   Paroxysmal A-fib (HCC) 05/20/2022   Hypertrophy of nasal turbinates 09/05/2021   Chronic kidney disease, stage 5 (HCC)    Aortic atherosclerosis (HCC) 06/24/2021   Chronic rhinitis 10/01/2017   Seasonal allergic conjunctivitis 10/01/2017   Aneurysm (HCC) 09/24/2017   Acute metabolic encephalopathy 09/02/2017   Allergic rhinitis 08/20/2017   Palpitations 07/10/2016   Gout 01/23/2016   History of cataract 02/27/2015   Seasonal and perennial allergic rhinitis 02/15/2012   Chronic diastolic CHF (congestive heart failure) (HCC) 10/11/2011   GERD (gastroesophageal reflux disease)    Type 2 diabetes mellitus without complication, without long-term current use of insulin  (HCC) 07/03/2010   Hyperlipidemia 07/03/2010   Essential hypertension 07/03/2010    PCP: Corrina Sabin  REFERRING PROVIDER: Corrina Sabin  REFERRING DIAG: M54.50,G89.29 (ICD-10-CM) - Chronic right-sided low back pain without sciaticaM25.551 (ICD-10-CM) - Right hip pain  Rationale for Evaluation and Treatment:  Rehabilitation  THERAPY DIAG:  No diagnosis found.  ONSET DATE: 10/11/23  SUBJECTIVE:                                                                                                                                                                                           SUBJECTIVE STATEMENT: I am feeling good. The same thing with the  back, it comes and goes.   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   She experiences pain in the lower back, specifically in the right hip and lower back, which makes standing for more than two minutes difficult. The pain is localized to the hip area without radiation down the leg. She is receiving physical therapy at home and is interested in outpatient therapy. Tylenol  and a heating pad provide pain relief. Hip x-ray from 2024 revealed degenerative changes send pubic symphysis, both hips, SI joints and lower lumbar spine.  She fell from her bed last Monday, resulting in pain on the left side of her head and right shoulder, which was swollen but has improved. No current swelling or pain with pressure. She experiences dizziness at times and uses a cane occasionally.    PAIN:  Are you having pain? Yes: NPRS scale: 0/10 now, 5/10 when she stands for a while Pain location: R hip and midline low back  Pain description: not there all the time, if I am sitting I am okay Aggravating factors: when I get up, when I am standing, walking  Relieving factors: Tylenol  and gabapentin   PRECAUTIONS: Fall  RED FLAGS: None   WEIGHT BEARING RESTRICTIONS: No  FALLS:  Has patient fallen in last 6 months? Yes. Number of falls 2 knee gave out in March, then 1 time fell out of the bed   LIVING ENVIRONMENT: Lives with: lives with their family Lives in: House/apartment Stairs: Yes: Internal: 14 steps; on right  going up and the wall on the other side Has following equipment at home: Single point cane and Walker - 2 wheeled  OCCUPATION: N/A  PLOF: Independent with basic ADLs  PATIENT GOALS: to get rid of the pain, be able to walk and stand for longer than 2 mins without the pain  NEXT MD VISIT: 03/01/24  OBJECTIVE:  Note: Objective measures were completed at Evaluation unless otherwise noted.  DIAGNOSTIC FINDINGS:  X ray ordered for hips, lumbar, and knees   COGNITION: Overall cognitive status: Within functional limits for tasks assessed     SENSATION: WFL  POSTURE: rounded shoulders, forward head, and increased thoracic kyphosis  PALPATION: TTP and pain lumbar spine   LUMBAR ROM:   AROM eval 01/14/24 01/21/24  Flexion Distal shin with pain WFL   Extension 25% available Limited 25% Limited 25%  Right lateral flexion Mid thigh with pain Limited 25% Limited 25%  Left lateral flexion Mid thigh with pain WFL   Right rotation 75% available with pain WFL   Left rotation 75% available with pain WFL    (Blank rows = not tested)  LOWER EXTREMITY ROM:   grossly WFL   LOWER EXTREMITY MMT:  grossly 4-/5 BLE    FUNCTIONAL TESTS:  5 times sit to stand: 43s from elevated mat and needing UE push off, loses balance on to heels  Timed up and go (TUG): 24s   GAIT: Distance walked: in clinic distances Assistive device utilized: None Level of assistance: CGA Comments: furniture walking, or reaches out to grab daughter's hand to walk  TREATMENT DATE:  02/11/24 NuStep Shoulder ext Step ups  Feet on ball rotations and knees to chest Bridges Stretching   01/28/24 NuStep L5x83mins  STS on airex 2x10 HS curls 20lb 2x10 Leg Ext 10lb 2x10 Shoulder ext and rows green band 2x10 Passive stretching- HS, SKTC, LTR     01/21/24 NuStep L5 x 6 min Goals  Lumbar ROM 5x S2S 17.77 LE did push against table on first rep HS curls 20lb 2x10 Leg Ext 10lb 2x10 Shoulder Ext 5lb 2x10 Bridges  x10 Feet on ball bridge, obl and HS curl PROM BIL LE and trunk- tightness RT> left.  HS, piriformis, Double and single K2C   STM to R ITB  01/14/24 NuStep L5 x 6 min Goals  Lumbar ORM 5x S2S 19.54 LE did push against table on first rep  Gait three laps ~ 353ft some fatigue and instability at times  HS curls 20lb 2x10 Leg Ext 10lb 2x10 Feet on ball bridge, obl and HS curl PROM BIL LE and trunk- tightness RT> left. RT IT tenderness  HS, piriformis, Double and single [facility]  01/12/24 Nustep L5  6 min 20lb resisted side steps x5 Hip add ball squeeze 2x10 Hip abd green 2x10 Sit to stand 2x10 Feet on ball bridge, obl and HS curl PROM BIL LE and trunk- tightness RT> left. RT IT tenderness  HS, piriformis, Double and single [facility]   01/07/24 Nustep L 4 6 min Standing red tband shld ext and row 2 sets 10 Standing red tband hip ext and abd 10 xeach Seated trunk ext 2 sets 10 green tband STS 2 sets 5 with wt ball chest press Seated clams and hip flex 2 sets 10 Seated ADD with ball squeeze PROM BIL LE and trunk- tightness RT> left. RT IT tenderness Feet on ball bridge, obl and HS curl Isometric abdominals   12/31/23 Nustep L 3 Feet on ball bridge, KTC and obl 10 x each Isometric abdominals 10 x hold 3 sec ADD ball squeeze 10 x then 10 x with bridge Red tband clams 2 sets 10  Red tband marching 2 sets 10 PROM BIL LE And trunk- very tight STS 5x with UE and the elevated mat without UE 5 x Seated row  and shld ext 2 sets 10 Seated trunk ext 2 sets 10 green tband     12/24/23- EVAL, HEP                                                                                                                                 PATIENT EDUCATION:  Education details: POC, HEP, importance of movement Person educated: Patient and Child(ren) Education method: Medical illustrator Education comprehension: verbalized understanding  HOME EXERCISE PROGRAM: Access Code: [email or web address] URL:  [email or web address] Date: 01/12/2024 Prepared by: Tanda Sorrow  Exercises - Supine Lower Trunk Rotation  - 1 x daily - 7 x weekly - 2 sets - 10 reps - Seated March  - 1 x daily - 7 x weekly - 2 sets - 10 reps - Seated Hip Abduction with Resistance  - 1 x daily - 7 x weekly - 2 sets - 10 reps - Sit to Stand  - 1 x daily - 7 x weekly - 2 sets - 5 reps - Supine Bridge  - 1 x daily - 7 x weekly - 3 sets - 10 reps - Standing Hip Abduction with Counter Support  - 1 x daily - 7 x weekly - 3 sets - 10 reps  ASSESSMENT:  CLINICAL IMPRESSION:  patient doing well, arrives a few minutes late. Some ongoing pain in back but it is less frequent now. She states the stretching is good and she tries to do a little bit at home. Pt will continue to benefit from skilled therapy to address pain and weakness.   Patient is a 78 y.o. female who was seen today for physical therapy evaluation and treatment for balance, gait, low back and hip pain. She is ambulating without a device but is very unsteady and shuffles her feet. She was advised to use the [facility] at the least. Patient has a chronic cough that is pretty bad. Her daughter reports she is not complaint with HEP and spends most of the day sitting on the sofa. She presents with LE weakness and balance deficits that put her at a high fall risk. She states that her back and hip pain prevent her from doing anything. Patient is very wobbly when going from sit to stand especially after prolonged sitting. She will benefit from skilled PT to address her gait and balance impairments and decrease her hip and back pain to be more mobile.   OBJECTIVE IMPAIRMENTS: Abnormal gait, cardiopulmonary status limiting activity, decreased activity tolerance, decreased balance, decreased coordination, decreased endurance, decreased mobility, difficulty walking, decreased ROM, decreased strength, postural dysfunction, and pain.   ACTIVITY LIMITATIONS: lifting, bending,  sitting, standing, squatting, transfers, and locomotion level  PARTICIPATION LIMITATIONS: meal prep, cleaning, laundry, shopping, and community activity  PERSONAL FACTORS: Age, Behavior pattern, Past/current experiences, Time since onset of injury/illness/exacerbation, and 3+ comorbidities: CHF, ESRD on dialysis, neuropathy, chronic cough are also affecting patient's functional outcome.   REHAB POTENTIAL: Fair  CLINICAL DECISION MAKING: Stable/uncomplicated  EVALUATION COMPLEXITY: Low   GOALS: Goals reviewed with patient? Yes  SHORT TERM GOALS: Target date: 02/04/24  Patient will be independent with initial HEP. Baseline: given 12/24/23 Goal status: 01/07/24 MET  2.  Patient will demonstrate decreased fall risk by scoring < 15 sec on TUG. Baseline: 24s Goal status: 01/07/24 MET   LONG TERM GOALS: Target date: 03/17/24  Patient will be independent with advanced/ongoing HEP to improve outcomes and carryover.  Baseline:  Goal status: INITIAL  2.  Patient will be able to ambulate 500' with good safety to access community.  Baseline: not using AD, very unsteady and unable to walk or stand more than 2 mins Goal status: INITIAL  3.  Patient will report 50-75% improvement in R hip and low back pain Baseline: 10/10  Goal status: Progressing 7/10   4.  Patient will demonstrate improved functional LE strength as demonstrated by 5xSTS <20s Baseline: 43s elevated mat and UE push off Goal status: Progressing 01/14/24, Progressing 01/21/24  5.  Patient will increase standing and walking tolerance to 10 mins  Baseline: 2-5 mins  Goal status: INITIAL   PLAN:  PT FREQUENCY: 2x/week  PT DURATION: 12 weeks  PLANNED INTERVENTIONS: 97110-Therapeutic exercises, 97530- Therapeutic activity, 97112- Neuromuscular re-education, 97535- Self Care, [id number]- Manual therapy, [phone]- Gait training, [phone]- Electrical stimulation (unattended), Patient/Family education, Balance training, Stair training,  Dry Needling, Joint mobilization, Spinal mobilization, Cryotherapy, and Moist heat.  PLAN FOR NEXT SESSION: LE strengthening, balance training, low back and hip stretches . INCREASE HEP Pt daughter pt has not been compliant with HEP   Almetta Fam, PT 02/10/2024, 2:50 PM

## 2024-02-11 ENCOUNTER — Ambulatory Visit: Attending: Family Medicine

## 2024-02-11 ENCOUNTER — Ambulatory Visit

## 2024-02-11 DIAGNOSIS — M25551 Pain in right hip: Secondary | ICD-10-CM | POA: Diagnosis not present

## 2024-02-11 DIAGNOSIS — M6281 Muscle weakness (generalized): Secondary | ICD-10-CM | POA: Diagnosis not present

## 2024-02-11 DIAGNOSIS — R262 Difficulty in walking, not elsewhere classified: Secondary | ICD-10-CM | POA: Insufficient documentation

## 2024-02-11 DIAGNOSIS — R2689 Other abnormalities of gait and mobility: Secondary | ICD-10-CM | POA: Insufficient documentation

## 2024-02-11 DIAGNOSIS — M5459 Other low back pain: Secondary | ICD-10-CM | POA: Diagnosis not present

## 2024-02-11 NOTE — Therapy (Signed)
 OUTPATIENT PHYSICAL THERAPY THORACOLUMBAR   Patient Name: Paula [name]  MRN: [id number] DOB:[birth year], 78 y.o., female Today's Date: 02/11/2024  END OF SESSION:  PT End of Session - 02/11/24 1501     Visit Number 8    Date for PT Re-Evaluation 03/17/24    PT Start Time 1500    PT Stop Time 1545    PT Time Calculation (min) 45 min    Activity Tolerance Patient tolerated treatment well    Behavior During Therapy [facility] for tasks assessed/performed            Past Medical History:  Diagnosis Date   Allergic rhinitis    Allergy  1/12000   Arthritis    Asthma    Brain aneurysm    Cough    Diabetes mellitus    Diastolic CHF, chronic (HCC) 10/11/2011   ESRD on hemodialysis (HCC)    MWF   GERD (gastroesophageal reflux disease)    History of colon polyps 2012   tubular adenoma    Hyperlipidemia    Hypertension    Neuropathy 10/11/2011   PONV (postoperative nausea and vomiting)    one time after lymph node surgery   Past Surgical History:  Procedure Laterality Date   AV FISTULA PLACEMENT Right 10/15/2021   Procedure: ARTERIOVENOUS (AV) FISTULA  WITH PLACEMENT OF GORE-TEX STRETCH  GRAFT (4-22mmx45cm);  Surgeon: Eliza Lonni RAMAN, MD;  Location: [facility] OR;  Service: Vascular;  Laterality: Right;   CATARACT EXTRACTION     right eye   IR 3D INDEPENDENT WKST  12/14/2017   IR ANGIO INTRA EXTRACRAN SEL COM CAROTID INNOMINATE BILAT MOD SED  09/15/2017   IR ANGIO INTRA EXTRACRAN SEL COM CAROTID INNOMINATE UNI L MOD SED  12/14/2017   IR ANGIO VERTEBRAL SEL VERTEBRAL BILAT MOD SED  09/15/2017   IR RADIOLOGIST EVAL & MGMT  09/10/2017   IR RADIOLOGIST EVAL & MGMT  10/19/2017   lymphatic mass surgery     NASAL TURBINATE REDUCTION     RADIOLOGY WITH ANESTHESIA N/A 12/14/2017   Procedure: RADIOLOGY WITH ANESTHESIA EMBOLIZATION;  Surgeon: Dolphus Carrion, MD;  Location: [facility] OR;  Service: Radiology;  Laterality: N/A;   VIDEO BRONCHOSCOPY Bilateral 11/18/2017   Procedure: VIDEO  BRONCHOSCOPY WITHOUT FLUORO;  Surgeon: Shelah Lamar RAMAN, MD;  Location: [facility] ENDOSCOPY;  Service: Cardiopulmonary;  Laterality: Bilateral;   Patient Active Problem List   Diagnosis Date Noted   Hypoxemia 12/29/2023   Abnormal electrocardiogram (ECG) (EKG) 12/29/2023   Left ventricular hypertrophy 12/29/2023   CHF (congestive heart failure) (HCC) 10/12/2023   Blurry vision 10/11/2023   Anemia due to chronic kidney disease 10/11/2023   Acute respiratory failure with hypoxia (HCC) 08/17/2023   PNA (pneumonia) 08/17/2023   Volume overload 08/17/2023   Acute hypoxic respiratory failure (HCC) 08/17/2023   SNHL (sensory-neural hearing loss), 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   ESRD on dialysis (HCC) 11/20/2022   Mold exposure 08/14/2022   Opioid withdrawal (HCC) 08/14/2022   ILD (interstitial lung disease) (HCC) 08/14/2022   Secondary hyperparathyroidism, renal (HCC) 08/14/2022   Paroxysmal A-fib (HCC) 05/20/2022   Hypertrophy of nasal turbinates 09/05/2021   Chronic kidney disease, stage 5 (HCC)    Aortic atherosclerosis (HCC) 06/24/2021   Chronic rhinitis 10/01/2017   Seasonal allergic conjunctivitis 10/01/2017   Aneurysm (HCC) 09/24/2017   Acute metabolic encephalopathy 09/02/2017   Allergic rhinitis 08/20/2017   Palpitations 07/10/2016   Gout 01/23/2016   History of cataract 02/27/2015   Seasonal and perennial allergic rhinitis 02/15/2012   Chronic diastolic CHF (  congestive heart failure) (HCC) 10/11/2011   GERD (gastroesophageal reflux disease)    Type 2 diabetes mellitus without complication, without long-term current use of insulin  (HCC) 07/03/2010   Hyperlipidemia 07/03/2010   Essential hypertension 07/03/2010    PCP: Corrina Sabin  REFERRING PROVIDER: Corrina Sabin  REFERRING DIAG: M54.50,G89.29 (ICD-10-CM) - Chronic right-sided low back pain without sciaticaM25.551 (ICD-10-CM) - Right hip pain  Rationale for Evaluation and Treatment: Rehabilitation  THERAPY DIAG:  Muscle  weakness (generalized)  Difficulty in walking, not elsewhere classified  Pain in right hip  Other low back pain  Other abnormalities of gait and mobility  ONSET DATE: 10/11/23  SUBJECTIVE:                                                                                                                                                                                           SUBJECTIVE STATEMENT: My back is hurting because I couldn't come here last week.   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   She experiences pain in the lower back, specifically in the right hip and lower back, which makes standing for more than two minutes difficult. The pain is localized to the hip area without radiation down the leg. She is receiving physical therapy at home and is interested in outpatient therapy. Tylenol  and a heating pad provide pain relief. Hip x-ray from 2024 revealed degenerative changes send pubic symphysis, both hips, SI joints and lower lumbar spine.  She fell from her bed last Monday, resulting in pain on the left side of her head and right shoulder, which was swollen but has improved. No current swelling or pain with pressure. She experiences dizziness at times and uses a cane occasionally.    PAIN:  Are you having pain? Yes: NPRS scale: 0/10 now, 5/10 when she stands for a while Pain location: R hip and midline low back  Pain description: not there all the time, if I am sitting I am  okay Aggravating factors: when I get up, when I am standing, walking  Relieving factors: Tylenol  and gabapentin   PRECAUTIONS: Fall  RED FLAGS: None   WEIGHT BEARING RESTRICTIONS: No  FALLS:  Has patient fallen in last 6 months?  Yes. Number of falls 2 knee gave out in March, then 1 time fell out of the bed   LIVING ENVIRONMENT: Lives with: lives with their family Lives in: House/apartment Stairs: Yes: Internal: 14 steps; on right going up and the wall on the other side Has following equipment at home: Single point cane and Walker - 2 wheeled  OCCUPATION: N/A  PLOF: Independent with basic ADLs  PATIENT GOALS: to get rid of the pain, be able to walk and stand for longer than 2 mins without the pain  NEXT MD VISIT: 03/01/24  OBJECTIVE:  Note: Objective measures were completed at Evaluation unless otherwise noted.  DIAGNOSTIC FINDINGS:  X ray ordered for hips, lumbar, and knees   COGNITION: Overall cognitive status: Within functional limits for tasks assessed     SENSATION: WFL  POSTURE: rounded shoulders, forward head, and increased thoracic kyphosis  PALPATION: TTP and pain lumbar spine   LUMBAR ROM:   AROM eval 01/14/24 01/21/24  Flexion Distal shin with pain WFL   Extension 25% available Limited 25% Limited 25%  Right lateral flexion Mid thigh with pain Limited 25% Limited 25%  Left lateral flexion Mid thigh with pain WFL   Right rotation 75% available with pain WFL   Left rotation 75% available with pain WFL    (Blank rows = not tested)  LOWER EXTREMITY ROM:   grossly WFL   LOWER EXTREMITY MMT:  grossly 4-/5 BLE    FUNCTIONAL TESTS:  5 times sit to stand: 43s from elevated mat and needing UE push off, loses balance on to heels  Timed up and go (TUG): 24s   GAIT: Distance walked: in clinic distances Assistive device utilized: None Level of assistance: CGA Comments: furniture walking, or reaches out to grab daughter's hand to walk  TREATMENT DATE:   02/11/24 NuStep L5x74mins Shoulder ext 5# 2x10 Step ups 4  Feet on ball rotations and knees to chest x10 Bridges 2x10 Passive stretching HS, glutes, SKTC, LTR STS 2x10  01/28/24 NuStep L5x70mins  STS on airex 2x10 HS curls 20lb 2x10 Leg Ext 10lb 2x10 Shoulder ext and rows green band 2x10 Passive stretching- HS, SKTC, LTR     01/21/24 NuStep L5 x 6 min Goals  Lumbar ROM 5x S2S 17.77 LE did push against table on first rep HS curls 20lb 2x10 Leg Ext 10lb 2x10 Shoulder Ext 5lb 2x10 Bridges x10 Feet on ball bridge, obl and HS curl PROM BIL LE and trunk- tightness RT> left.  HS, piriformis, Double and single K2C   STM to R ITB  01/14/24 NuStep L5 x 6 min Goals  Lumbar ORM 5x S2S 19.54 LE did push against table on first rep  Gait three laps ~ 375ft some fatigue and instability at times  HS curls 20lb 2x10 Leg Ext 10lb 2x10 Feet on ball bridge, obl and HS curl PROM BIL LE and trunk- tightness RT> left. RT IT tenderness  HS, piriformis, Double and single [facility]  01/12/24 Nustep L5  6 min 20lb resisted side steps x5 Hip add ball squeeze 2x10 Hip abd green 2x10 Sit to stand 2x10 Feet on ball bridge, obl and HS curl PROM BIL LE and trunk- tightness RT> left. RT IT tenderness  HS, piriformis, Double and single [facility]   01/07/24 Nustep L 4 6 min Standing red tband shld ext and row 2 sets 10 Standing red tband hip ext and abd 10 xeach Seated trunk ext 2 sets 10 green tband STS 2 sets 5 with wt  ball chest press Seated clams and hip flex 2 sets 10 Seated ADD with ball squeeze PROM BIL LE and trunk- tightness RT> left. RT IT tenderness Feet on ball bridge, obl and HS curl Isometric abdominals   12/31/23 Nustep L 3 Feet on ball bridge, KTC and obl 10 x each Isometric abdominals 10 x hold 3 sec ADD ball squeeze 10 x then 10 x with bridge Red tband clams 2 sets 10  Red tband marching 2 sets 10 PROM BIL LE And trunk- very tight STS 5x with UE and the elevated mat without  UE 5 x Seated row and shld ext 2 sets 10 Seated trunk ext 2 sets 10 green tband     12/24/23- EVAL, HEP                                                                                                                                 PATIENT EDUCATION:  Education details: POC, HEP, importance of movement Person educated: Patient and Child(ren) Education method: Medical illustrator Education comprehension: verbalized understanding  HOME EXERCISE PROGRAM: Access Code: [email or web address] URL: [email or web address] Date: 01/12/2024 Prepared by: Tanda Sorrow  Exercises - Supine Lower Trunk Rotation  - 1 x daily - 7 x weekly - 2 sets - 10 reps - Seated March  - 1 x daily - 7 x weekly - 2 sets - 10 reps - Seated Hip Abduction with Resistance  - 1 x daily - 7 x weekly - 2 sets - 10 reps - Sit to Stand  - 1 x daily - 7 x weekly - 2 sets - 5 reps - Supine Bridge  - 1 x daily - 7 x weekly - 3 sets - 10 reps - Standing Hip Abduction with Counter Support  - 1 x daily - 7 x weekly - 3 sets - 10 reps  ASSESSMENT:  CLINICAL IMPRESSION:   patient reports some ongoing pain in back but it is less frequent now. States when she is not doing therapy it is worse, and reports not doing much at home. She states the stretching is helpful. Pt will continue to benefit from skilled therapy to address pain and weakness.   Patient is a 78 y.o. female who was seen today for physical therapy evaluation and treatment for balance, gait, low back and hip pain. She is ambulating without a device but is very unsteady and shuffles her feet. She was advised to use the [facility] at the least. Patient has a chronic cough that is pretty bad. Her daughter reports she is not complaint with HEP and spends most of the day sitting on the sofa. She presents with LE weakness and balance deficits that put her at a high fall risk. She states that her back and hip pain prevent her from doing anything. Patient is very wobbly  when going from sit to stand especially after prolonged sitting. She will benefit from skilled PT to address  her gait and balance impairments and decrease her hip and back pain to be more mobile.   OBJECTIVE IMPAIRMENTS: Abnormal gait, cardiopulmonary status limiting activity, decreased activity tolerance, decreased balance, decreased coordination, decreased endurance, decreased mobility, difficulty walking, decreased ROM, decreased strength, postural dysfunction, and pain.   ACTIVITY LIMITATIONS: lifting, bending, sitting, standing, squatting, transfers, and locomotion level  PARTICIPATION LIMITATIONS: meal prep, cleaning, laundry, shopping, and community activity  PERSONAL FACTORS: Age, Behavior pattern, Past/current experiences, Time since onset of injury/illness/exacerbation, and 3+ comorbidities: CHF, ESRD on dialysis, neuropathy, chronic cough are also affecting patient's functional outcome.   REHAB POTENTIAL: Fair    CLINICAL DECISION MAKING: Stable/uncomplicated  EVALUATION COMPLEXITY: Low   GOALS: Goals reviewed with patient? Yes  SHORT TERM GOALS: Target date: 02/04/24  Patient will be independent with initial HEP. Baseline: given 12/24/23 Goal status: 01/07/24 MET  2.  Patient will demonstrate decreased fall risk by scoring < 15 sec on TUG. Baseline: 24s Goal status: 01/07/24 MET   LONG TERM GOALS: Target date: 03/17/24  Patient will be independent with advanced/ongoing HEP to improve outcomes and carryover.  Baseline:  Goal status: INITIAL  2.  Patient will be able to ambulate 500' with good safety to access community.  Baseline: not using AD, very unsteady and unable to walk or stand more than 2 mins Goal status: INITIAL  3.  Patient will report 50-75% improvement in R hip and low back pain Baseline: 10/10  Goal status: Progressing 7/10   4.  Patient will demonstrate improved functional LE strength as demonstrated by 5xSTS <20s Baseline: 43s elevated mat and UE  push off Goal status: Progressing 01/14/24, Progressing 01/21/24  5.  Patient will increase standing and walking tolerance to 10 mins  Baseline: 2-5 mins  Goal status: INITIAL   PLAN:  PT FREQUENCY: 2x/week  PT DURATION: 12 weeks  PLANNED INTERVENTIONS: 97110-Therapeutic exercises, 97530- Therapeutic activity, 97112- Neuromuscular re-education, 97535- Self Care, [id number]- Manual therapy, [phone]- Gait training, [phone]- Electrical stimulation (unattended), Patient/Family education, Balance training, Stair training, Dry Needling, Joint mobilization, Spinal mobilization, Cryotherapy, and Moist heat.  PLAN FOR NEXT SESSION: LE strengthening, balance training, low back and hip stretches . INCREASE HEP Pt daughter pt has not been compliant with HEP   Almetta Fam, PT 02/11/2024, 3:45 PM

## 2024-02-12 DIAGNOSIS — Z992 Dependence on renal dialysis: Secondary | ICD-10-CM | POA: Diagnosis not present

## 2024-02-12 DIAGNOSIS — N2581 Secondary hyperparathyroidism of renal origin: Secondary | ICD-10-CM | POA: Diagnosis not present

## 2024-02-12 DIAGNOSIS — N186 End stage renal disease: Secondary | ICD-10-CM | POA: Diagnosis not present

## 2024-02-13 ENCOUNTER — Other Ambulatory Visit: Payer: Self-pay | Admitting: Family Medicine

## 2024-02-13 ENCOUNTER — Other Ambulatory Visit (HOSPITAL_COMMUNITY): Payer: Self-pay

## 2024-02-13 ENCOUNTER — Encounter (HOSPITAL_COMMUNITY): Payer: Self-pay

## 2024-02-13 DIAGNOSIS — I10 Essential (primary) hypertension: Secondary | ICD-10-CM

## 2024-02-15 ENCOUNTER — Other Ambulatory Visit (HOSPITAL_COMMUNITY): Payer: Self-pay

## 2024-02-15 ENCOUNTER — Other Ambulatory Visit: Payer: Self-pay

## 2024-02-15 DIAGNOSIS — N186 End stage renal disease: Secondary | ICD-10-CM | POA: Diagnosis not present

## 2024-02-15 DIAGNOSIS — N2581 Secondary hyperparathyroidism of renal origin: Secondary | ICD-10-CM | POA: Diagnosis not present

## 2024-02-15 DIAGNOSIS — Z992 Dependence on renal dialysis: Secondary | ICD-10-CM | POA: Diagnosis not present

## 2024-02-15 MED ORDER — AMLODIPINE BESYLATE 5 MG PO TABS
5.0000 mg | ORAL_TABLET | Freq: Every day | ORAL | 1 refills | Status: DC
  Filled 2024-02-15: qty 90, 90d supply, fill #0

## 2024-02-15 NOTE — Telephone Encounter (Signed)
-----   Message from Lynwood Schilling sent at 02/12/2024 12:24 PM EDT ----- Looks like the lab never go the blood to run the SPEP.  Please reorder as a multiple myeloma panel.  Thanks.  ----- Message ----- From: Rebecka Memos Lab Results In Sent: 12/30/2023   4:35 AM EDT To: Lynwood Schilling, MD

## 2024-02-15 NOTE — Telephone Encounter (Signed)
 Spoke with pt's daughter (per DPR). Daughter aware of pt needing repeat lab draw. Lab ordered and released. Daughter verbalized understanding. All questions if any were answered.

## 2024-02-15 NOTE — Therapy (Incomplete)
 OUTPATIENT PHYSICAL THERAPY THORACOLUMBAR   Patient Name: Paula [name]  MRN: [id number] DOB:1945-11-07, 78 y.o., female Today's Date: 02/15/2024  END OF SESSION:      Past Medical History:  Diagnosis Date   Allergic rhinitis    Allergy  1/12000   Arthritis    Asthma    Brain aneurysm    Cough    Diabetes mellitus    Diastolic CHF, chronic (HCC) 10/11/2011   ESRD on hemodialysis (HCC)    MWF   GERD (gastroesophageal reflux disease)    History of colon polyps 2012   tubular adenoma    Hyperlipidemia    Hypertension    Neuropathy 10/11/2011   PONV (postoperative nausea and vomiting)    one time after lymph node surgery   Past Surgical History:  Procedure Laterality Date   AV FISTULA PLACEMENT Right 10/15/2021   Procedure: ARTERIOVENOUS (AV) FISTULA  WITH PLACEMENT OF GORE-TEX STRETCH  GRAFT (4-39mmx45cm);  Surgeon: Eliza Lonni RAMAN, MD;  Location: [facility] OR;  Service: Vascular;  Laterality: Right;   CATARACT EXTRACTION     right eye   IR 3D INDEPENDENT WKST  12/14/2017   IR ANGIO INTRA EXTRACRAN SEL COM CAROTID INNOMINATE BILAT MOD SED  09/15/2017   IR ANGIO INTRA EXTRACRAN SEL COM CAROTID INNOMINATE UNI L MOD SED  12/14/2017   IR ANGIO VERTEBRAL SEL VERTEBRAL BILAT MOD SED  09/15/2017   IR RADIOLOGIST EVAL & MGMT  09/10/2017   IR RADIOLOGIST EVAL & MGMT  10/19/2017   lymphatic mass surgery     NASAL TURBINATE REDUCTION     RADIOLOGY WITH ANESTHESIA N/A 12/14/2017   Procedure: RADIOLOGY WITH ANESTHESIA EMBOLIZATION;  Surgeon: Dolphus Carrion, MD;  Location: [facility] OR;  Service: Radiology;  Laterality: N/A;   VIDEO BRONCHOSCOPY Bilateral 11/18/2017   Procedure: VIDEO BRONCHOSCOPY WITHOUT FLUORO;  Surgeon: Shelah Lamar RAMAN, MD;  Location: [facility] ENDOSCOPY;  Service: Cardiopulmonary;  Laterality: Bilateral;   Patient Active Problem List   Diagnosis Date Noted   Hypoxemia 12/29/2023   Abnormal electrocardiogram (ECG) (EKG) 12/29/2023   Left ventricular hypertrophy  12/29/2023   CHF (congestive heart failure) (HCC) 10/12/2023   Blurry vision 10/11/2023   Anemia due to chronic kidney disease 10/11/2023   Acute respiratory failure with hypoxia (HCC) 08/17/2023   PNA (pneumonia) 08/17/2023   Volume overload 08/17/2023   Acute hypoxic respiratory failure (HCC) 08/17/2023   SNHL (sensory-neural hearing loss), 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   ESRD on dialysis (HCC) 11/20/2022   Mold exposure 08/14/2022   Opioid withdrawal (HCC) 08/14/2022   ILD (interstitial lung disease) (HCC) 08/14/2022   Secondary hyperparathyroidism, renal (HCC) 08/14/2022   Paroxysmal A-fib (HCC) 05/20/2022   Hypertrophy of nasal turbinates 09/05/2021   Chronic kidney disease, stage 5 (HCC)    Aortic atherosclerosis (HCC) 06/24/2021   Chronic rhinitis 10/01/2017   Seasonal allergic conjunctivitis 10/01/2017   Aneurysm (HCC) 09/24/2017   Acute metabolic encephalopathy 09/02/2017   Allergic rhinitis 08/20/2017   Palpitations 07/10/2016   Gout 01/23/2016   History of cataract 02/27/2015   Seasonal and perennial allergic rhinitis 02/15/2012   Chronic diastolic CHF (congestive heart failure) (HCC) 10/11/2011   GERD (gastroesophageal reflux disease)    Type 2 diabetes mellitus without complication, without long-term current use of insulin  (HCC) 07/03/2010   Hyperlipidemia 07/03/2010   Essential hypertension 07/03/2010    PCP: Corrina Sabin  REFERRING PROVIDER: Corrina Sabin  REFERRING DIAG: M54.50,G89.29 (ICD-10-CM) - Chronic right-sided low back pain without sciaticaM25.551 (ICD-10-CM) - Right hip pain  Rationale for Evaluation and  Treatment: Rehabilitation  THERAPY DIAG:  No diagnosis found.  ONSET DATE: 10/11/23  SUBJECTIVE:                                                                                                                                                                                           SUBJECTIVE STATEMENT: My back is hurting because I couldn't come  here last week.   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   She experiences pain in the lower back, specifically in the right hip and lower back, which makes standing for more than two minutes difficult. The pain is localized to the hip area without radiation down the leg. She is receiving physical therapy at home and is interested in outpatient therapy. Tylenol  and a heating pad provide pain relief. Hip x-ray from 2024 revealed degenerative changes send pubic symphysis, both hips, SI joints and lower lumbar spine.  She fell from her bed last Monday, resulting in pain on the left side of her head and right shoulder, which was swollen but has improved. No current swelling or pain with pressure. She experiences dizziness at times and uses a cane occasionally.    PAIN:  Are you having pain? Yes: NPRS scale: 0/10 now, 5/10 when she stands for a while Pain location: R hip and midline low back  Pain description: not there all the time, if I am sitting I am okay Aggravating factors: when I get up, when I am standing, walking  Relieving factors: Tylenol  and gabapentin   PRECAUTIONS: Fall  RED FLAGS: None   WEIGHT BEARING RESTRICTIONS: No  FALLS:  Has patient fallen in last 6 months? Yes. Number of falls 2 knee gave out in March, then 1 time fell out of the bed   LIVING ENVIRONMENT: Lives with: lives with their family Lives in: House/apartment Stairs: Yes: Internal: 14 steps; on right going up and  the wall on the other side Has following equipment at home: Single point cane and Walker - 2 wheeled  OCCUPATION: N/A  PLOF: Independent with basic ADLs  PATIENT GOALS: to get rid of the pain, be able to walk and stand for longer than 2 mins without the pain  NEXT MD  VISIT: 03/01/24  OBJECTIVE:  Note: Objective measures were completed at Evaluation unless otherwise noted.  DIAGNOSTIC FINDINGS:  X ray ordered for hips, lumbar, and knees   COGNITION: Overall cognitive status: Within functional limits for tasks assessed     SENSATION: WFL  POSTURE: rounded shoulders, forward head, and increased thoracic kyphosis  PALPATION: TTP and pain lumbar spine   LUMBAR ROM:   AROM eval 01/14/24 01/21/24  Flexion Distal shin with pain WFL   Extension 25% available Limited 25% Limited 25%  Right lateral flexion Mid thigh with pain Limited 25% Limited 25%  Left lateral flexion Mid thigh with pain WFL   Right rotation 75% available with pain WFL   Left rotation 75% available with pain WFL    (Blank rows = not tested)  LOWER EXTREMITY ROM:   grossly WFL   LOWER EXTREMITY MMT:  grossly 4-/5 BLE    FUNCTIONAL TESTS:  5 times sit to stand: 43s from elevated mat and needing UE push off, loses balance on to heels  Timed up and go (TUG): 24s   GAIT: Distance walked: in clinic distances Assistive device utilized: None Level of assistance: CGA Comments: furniture walking, or reaches out to grab daughter's hand to walk  TREATMENT DATE:  02/16/24 NuStep Leg ext HS curls  STS on airex Walking on beam  Step ups   02/11/24 NuStep L5x44mins Shoulder ext 5# 2x10 Step ups 4  Feet on ball rotations and knees to chest x10 Bridges 2x10 Passive stretching HS, glutes, SKTC, LTR STS 2x10  01/28/24 NuStep L5x66mins  STS on airex 2x10 HS curls 20lb 2x10 Leg Ext 10lb 2x10 Shoulder ext and rows green band 2x10 Passive stretching- HS, SKTC, LTR     01/21/24 NuStep L5 x 6  min Goals  Lumbar ROM 5x S2S 17.77 LE did push against table on first rep HS curls 20lb 2x10 Leg Ext 10lb 2x10 Shoulder Ext 5lb 2x10 Bridges x10 Feet on ball bridge, obl and HS curl PROM BIL LE and trunk- tightness RT> left.  HS, piriformis, Double and single K2C   STM to R ITB  01/14/24 NuStep L5 x 6 min Goals  Lumbar ORM 5x S2S 19.54 LE did push against table on first rep  Gait three laps ~ 354ft some fatigue and instability at times  HS curls 20lb 2x10 Leg Ext 10lb 2x10 Feet on ball bridge, obl and HS curl PROM BIL LE and trunk- tightness RT> left. RT IT tenderness  HS, piriformis, Double and single [facility]  01/12/24 Nustep L5  6 min 20lb resisted side steps x5 Hip add ball squeeze 2x10 Hip abd green 2x10 Sit to stand 2x10 Feet on ball bridge, obl and HS curl PROM BIL LE and trunk- tightness RT> left. RT IT tenderness  HS, piriformis, Double and single [facility]   01/07/24 Nustep L 4 6 min Standing red tband shld ext and row 2 sets 10 Standing red tband hip ext and abd 10 xeach Seated trunk ext 2 sets 10 green tband STS 2 sets 5 with wt ball chest press Seated clams and hip flex 2 sets 10 Seated ADD with ball squeeze PROM BIL LE and trunk- tightness RT> left. RT IT tenderness Feet on ball bridge, obl and HS curl Isometric abdominals   12/31/23 Nustep L 3 Feet on ball bridge, KTC and obl 10 x each Isometric abdominals 10 x hold 3 sec ADD ball squeeze 10 x then 10 x with bridge Red tband clams 2 sets 10  Red tband marching 2 sets 10 PROM BIL LE And trunk- very tight STS 5x with UE and the elevated mat without UE 5 x Seated row and shld ext 2 sets 10 Seated trunk ext 2 sets 10 green tband     12/24/23- EVAL, HEP                                                                                                                                 PATIENT EDUCATION:  Education details: POC, HEP, importance of movement Person educated: Patient and Child(ren) Education  method: Medical illustrator Education comprehension: verbalized understanding  HOME EXERCISE PROGRAM: Access Code: [email or web address] URL: [email or web address] Date: 01/12/2024 Prepared by: Tanda Sorrow  Exercises - Supine Lower Trunk Rotation  - 1 x daily - 7 x weekly - 2 sets - 10 reps - Seated March  - 1 x daily - 7 x weekly - 2 sets - 10 reps - Seated Hip Abduction with Resistance  - 1 x daily - 7 x weekly - 2 sets - 10 reps - Sit to Stand  - 1 x daily - 7 x weekly - 2 sets - 5 reps - Supine Bridge  - 1 x daily - 7 x weekly - 3 sets - 10 reps - Standing Hip Abduction with Counter Support  - 1 x daily - 7 x weekly - 3 sets - 10 reps  ASSESSMENT:  CLINICAL IMPRESSION:   patient reports some ongoing pain in back but it is less frequent now. States when she is not doing therapy it is worse, and reports not doing much at home. She states the stretching is helpful. Pt will continue to benefit from skilled therapy to address pain and weakness.   Patient is a 78 y.o. female who was seen today for physical therapy evaluation and treatment for balance, gait, low back and hip pain. She is ambulating without a device but is very unsteady and shuffles her feet. She was advised to use the [facility] at the least. Patient has a chronic cough that is pretty bad. Her daughter reports she is not complaint with HEP and spends most of the day sitting on the sofa. She presents with LE weakness and balance deficits that put her at a high fall risk. She states that her back and hip pain prevent her from doing anything. Patient is very wobbly when going from sit to stand especially after prolonged sitting. She will benefit from skilled PT to address her gait and balance impairments and decrease her hip and back pain to be more mobile.   OBJECTIVE IMPAIRMENTS: Abnormal gait, cardiopulmonary status limiting activity, decreased activity tolerance, decreased balance, decreased coordination, decreased  endurance, decreased mobility, difficulty walking, decreased ROM, decreased strength, postural dysfunction, and pain.   ACTIVITY LIMITATIONS: lifting, bending, sitting, standing, squatting, transfers, and locomotion level  PARTICIPATION LIMITATIONS: meal prep, cleaning, laundry, shopping, and community activity  PERSONAL FACTORS: Age, Behavior pattern, Past/current  experiences, Time since onset of injury/illness/exacerbation, and 3+ comorbidities: CHF, ESRD on dialysis, neuropathy, chronic cough are also affecting patient's functional outcome.   REHAB POTENTIAL: Fair    CLINICAL DECISION MAKING: Stable/uncomplicated  EVALUATION COMPLEXITY: Low   GOALS: Goals reviewed with patient? Yes  SHORT TERM GOALS: Target date: 02/04/24  Patient will be independent with initial HEP. Baseline: given 12/24/23 Goal status: 01/07/24 MET  2.  Patient will demonstrate decreased fall risk by scoring < 15 sec on TUG. Baseline: 24s Goal status: 01/07/24 MET   LONG TERM GOALS: Target date: 03/17/24  Patient will be independent with advanced/ongoing HEP to improve outcomes and carryover.  Baseline:  Goal status: INITIAL  2.  Patient will be able to ambulate 500' with good safety to access community.  Baseline: not using AD, very unsteady and unable to walk or stand more than 2 mins Goal status: INITIAL  3.  Patient will report 50-75% improvement in R hip and low back pain Baseline: 10/10  Goal status: Progressing 7/10   4.  Patient will demonstrate improved functional LE strength as demonstrated by 5xSTS <20s Baseline: 43s elevated mat and UE push off Goal status: Progressing 01/14/24, Progressing 01/21/24  5.  Patient will increase standing and walking tolerance to 10 mins  Baseline: 2-5 mins  Goal status: INITIAL   PLAN:  PT FREQUENCY: 2x/week  PT DURATION: 12 weeks  PLANNED INTERVENTIONS: 97110-Therapeutic exercises, 97530- Therapeutic activity, 97112- Neuromuscular re-education, 97535-  Self Care, [id number]- Manual therapy, [phone]- Gait training, [phone]- Electrical stimulation (unattended), Patient/Family education, Balance training, Stair training, Dry Needling, Joint mobilization, Spinal mobilization, Cryotherapy, and Moist heat.  PLAN FOR NEXT SESSION: LE strengthening, balance training, low back and hip stretches . INCREASE HEP Pt daughter pt has not been compliant with HEP   Almetta Fam, PT 02/15/2024, 3:31 PM

## 2024-02-16 ENCOUNTER — Ambulatory Visit

## 2024-02-16 ENCOUNTER — Other Ambulatory Visit (HOSPITAL_COMMUNITY): Payer: Self-pay

## 2024-02-16 ENCOUNTER — Other Ambulatory Visit: Payer: Self-pay

## 2024-02-17 DIAGNOSIS — N186 End stage renal disease: Secondary | ICD-10-CM | POA: Diagnosis not present

## 2024-02-17 DIAGNOSIS — N2581 Secondary hyperparathyroidism of renal origin: Secondary | ICD-10-CM | POA: Diagnosis not present

## 2024-02-17 DIAGNOSIS — Z992 Dependence on renal dialysis: Secondary | ICD-10-CM | POA: Diagnosis not present

## 2024-02-18 ENCOUNTER — Ambulatory Visit: Admitting: Physical Therapy

## 2024-02-18 NOTE — Telephone Encounter (Signed)
 Last read by Lynder A Jepsen  at 10:11PM on 02/17/2024.

## 2024-02-19 DIAGNOSIS — N2581 Secondary hyperparathyroidism of renal origin: Secondary | ICD-10-CM | POA: Diagnosis not present

## 2024-02-19 DIAGNOSIS — Z992 Dependence on renal dialysis: Secondary | ICD-10-CM | POA: Diagnosis not present

## 2024-02-19 DIAGNOSIS — N186 End stage renal disease: Secondary | ICD-10-CM | POA: Diagnosis not present

## 2024-02-20 ENCOUNTER — Other Ambulatory Visit (HOSPITAL_COMMUNITY): Payer: Self-pay

## 2024-02-20 ENCOUNTER — Encounter (HOSPITAL_COMMUNITY): Payer: Self-pay

## 2024-02-22 ENCOUNTER — Telehealth: Payer: Self-pay | Admitting: Emergency Medicine

## 2024-02-22 DIAGNOSIS — Z992 Dependence on renal dialysis: Secondary | ICD-10-CM | POA: Diagnosis not present

## 2024-02-22 DIAGNOSIS — N2581 Secondary hyperparathyroidism of renal origin: Secondary | ICD-10-CM | POA: Diagnosis not present

## 2024-02-22 DIAGNOSIS — N186 End stage renal disease: Secondary | ICD-10-CM | POA: Diagnosis not present

## 2024-02-22 NOTE — Telephone Encounter (Signed)
 Patient scheduled with Lauraine Lites 7/15. Nfn

## 2024-02-22 NOTE — Telephone Encounter (Signed)
 Please see last canceled appt. Our AC was out and she had to be canceled. It says to sched POC with Ms. Ruthell but only August appt avail. PT dcln and states she needs to  be sooner. No other NP appts avail.  Mom needs the O2 machine. Please call Pt daughter to advise. PT has dyalisis mon, wed, fri

## 2024-02-23 ENCOUNTER — Other Ambulatory Visit (HOSPITAL_COMMUNITY): Payer: Self-pay

## 2024-02-23 ENCOUNTER — Ambulatory Visit (INDEPENDENT_AMBULATORY_CARE_PROVIDER_SITE_OTHER): Admitting: Acute Care

## 2024-02-23 ENCOUNTER — Encounter: Payer: Self-pay | Admitting: Acute Care

## 2024-02-23 VITALS — BP 185/73 | HR 70 | Temp 98.9°F | Ht 62.0 in | Wt 165.0 lb

## 2024-02-23 DIAGNOSIS — J9691 Respiratory failure, unspecified with hypoxia: Secondary | ICD-10-CM | POA: Diagnosis not present

## 2024-02-23 DIAGNOSIS — R053 Chronic cough: Secondary | ICD-10-CM

## 2024-02-23 DIAGNOSIS — I509 Heart failure, unspecified: Secondary | ICD-10-CM

## 2024-02-23 DIAGNOSIS — R0609 Other forms of dyspnea: Secondary | ICD-10-CM

## 2024-02-23 NOTE — Patient Instructions (Addendum)
 It is good to see you today. Dr. Lavona qualified you for oxygen at the 12/29/2023 OV as is documented in his note. We have walked you today, on the portable oxygen concentrator at 2 L and you maintained your oxygen saturations  We will place the orders for POC.  We will schedule you for PFT's to better evaluate your breathing. Follow up with Dr. Shelah after.  Continue to use albuterol  nebulizer as needed for shortness of breath, spells of coughing. Continue Flonase  (fluticasone ) nasal spray once daily Continue Astelin  (azelastine ) nasal spray 2 times a day Continue loratadine  10 mg once daily Continue Protonix  40 mg once daily.  Take this medication 1 hour around food. Follow up with Dr. Shelah after PFT's or as needed. Please contact office for sooner follow up if symptoms do not improve or worsen or seek emergency care

## 2024-02-23 NOTE — Addendum Note (Signed)
 Addended by: MELVENIA WILFORD SAUNDERS on: 02/23/2024 09:29 AM   Modules accepted: Orders

## 2024-02-23 NOTE — Progress Notes (Signed)
 History of Present Illness Paula [name]  is a 78 y.o. 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 Dolphus. She also has  hypertension, hyperlipidemia, type 2 diabetes mellitus, ESRD on hemodialysis T/Th/Sat, and GERD. She is followed by Dr. Shelah for her chronic cough.  02/23/2024 Pt. Presents today for POC and oxygen evaluation. Patient seen 12/30/2023  in cardiology clinic. She dropped to 88% with ambulation. She is on supplemental oxygen based on Dr. Sharla order.She stated she's working on getting a concentrator since the large tanks are very hard for her to get around. Dr. Lavona requested she get seen for an oxygen qualification walk for  POC. She maintained her oxygen sats on the POC. We will place the order.  She continues to have her chronic cough. She is using albuterol  nebs, Flonase , Astelin , loratadine , and Protonix  as prescribed.We will order PFT's and see her back after to re-evaluate as it has been 5 years since she has had them.She knows to call to be seen sooner if she has any worsening in her condition.  Test Results: CTA 10/11/2023 Cardiovascular: There is marked severity calcification of the thoracic aorta. Satisfactory opacification of the pulmonary arteries to the segmental level. No evidence of pulmonary embolism. Normal heart size. No pericardial effusion.   Mediastinum/Nodes: No enlarged mediastinal, hilar, or axillary lymph nodes. Thyroid  gland, trachea, and esophagus demonstrate no significant findings.   Lungs/Pleura: Mild lingular and posterior bibasilar atelectasis is seen.   There is no evidence of an acute infiltrate, pleural effusion or pneumothorax.   Upper Abdomen: No acute abnormality.   Musculoskeletal: No chest wall  abnormality. No acute or significant osseous findings.   Review of the MIP images confirms the above findings.   IMPRESSION: 1. No evidence of pulmonary embolism or other acute intrathoracic process. 2. Mild lingular and posterior bibasilar atelectasis. 3. Aortic atherosclerosis.     Latest Ref Rng & Units 10/13/2023    5:11 AM 10/12/2023    4:25 AM 10/11/2023   11:56 PM  CBC  WBC 4.0 - 10.5 K/uL 6.8  6.6    Hemoglobin 12.0 - 15.0 g/dL 89.6  8.9  9.5   Hematocrit 36.0 - 46.0 % 29.9  26.3  28.0   Platelets 150 - 400 K/uL 154  154         Latest Ref Rng & Units 10/13/2023    5:11 AM 10/12/2023    4:25 AM 10/11/2023   11:56 PM  BMP  Glucose 70 - 99 mg/dL 813  815    BUN 8 - 23 mg/dL 31  60    Creatinine 9.55 - 1.00 mg/dL 4.53  2.31    Sodium 864 - 145 mmol/L 132  132  132   Potassium 3.5 - 5.1 mmol/L 3.3  3.6  3.6   Chloride 98 - 111 mmol/L 93  92    CO2 22 - 32 mmol/L 28  28    Calcium  8.9 - 10.3 mg/dL 8.5  8.6      BNP    Component Value Date/Time   BNP 827.8 (H) 10/11/2023 1530    ProBNP    Component Value Date/Time   PROBNP 27.5 10/10/2011 1329    PFT    Component Value Date/Time   FEV1PRE 1.87 12/22/2017 0914   FEV1POST 1.94 12/22/2017 0914  FVCPRE 2.28 12/22/2017 0914   FVCPOST 2.26 12/22/2017 0914   TLC 4.81 12/22/2017 0914   DLCOUNC 13.45 12/22/2017 0914   PREFEV1FVCRT 82 12/22/2017 0914   PSTFEV1FVCRT 86 12/22/2017 0914    No results found.   Past medical hx Past Medical History:  Diagnosis Date   Allergic rhinitis    Allergy  1/12000   Arthritis    Asthma    Brain aneurysm    Cough    Diabetes mellitus    Diastolic CHF, chronic (HCC) 10/11/2011   ESRD on hemodialysis (HCC)    MWF   GERD (gastroesophageal reflux disease)    History of colon polyps 2012   tubular adenoma    Hyperlipidemia    Hypertension    Neuropathy 10/11/2011   PONV (postoperative nausea and vomiting)    one time after lymph node surgery     Social History    Tobacco Use   Smoking status: Never    Passive exposure: Never   Smokeless tobacco: Never  Vaping Use   Vaping status: Never Used  Substance Use Topics   Alcohol  use: No   Drug use: No    Paula [name]  reports that she has never smoked. She has never been exposed to tobacco smoke. She has never used smokeless tobacco. She reports that she does not drink alcohol  and does not use drugs.  Tobacco Cessation: Never smoker    Past surgical hx, Family hx, Social hx all reviewed.  Current Outpatient Medications on File Prior to Visit  Medication Sig   ACCU-CHEK GUIDE TEST test strip USE 1 STRIP TO CHECK GLUCOSE TWICE DAILY   acetaminophen  (TYLENOL ) 500 MG tablet Take 500 mg by mouth every 6 (six) hours as needed for fever, moderate pain or mild pain.   albuterol  (PROAIR  HFA) 108 (90 Base) MCG/ACT inhaler Inhale 1-2 puffs into the lungs every 6 (six) hours as needed for wheezing or shortness of breath.   albuterol  (PROVENTIL ) (2.5 MG/3ML) 0.083% nebulizer solution Take 3 mLs (2.5 mg total) by nebulization every 6 (six) hours as needed for wheezing or shortness of breath.   amLODipine  (NORVASC ) 5 MG tablet Take 1 tablet (5 mg total) by mouth daily.   apixaban  (ELIQUIS ) 5 MG TABS tablet Take 1 tablet (5 mg total) by mouth 2 (two) times daily.   atorvastatin  (LIPITOR ) 80 MG tablet Take 1 tablet (80 mg total) by mouth daily.   azelastine  (ASTELIN ) 0.1 % nasal spray Place 2 sprays into both nostrils 2 (two) times daily. Use in each nostril as directed   benzonatate  (TESSALON ) 100 MG capsule Take 1 capsule by mouth three times a day as need for cough   calcium  acetate (PHOSLO ) 667 MG capsule Take 1 capsule (667 mg total) by mouth 3 (three) times daily with meals.   carvedilol  (COREG ) 12.5 MG tablet Take 1 tablet (12.5 mg total) by mouth 2 (two) times daily with a meal. (Take once a day in the evening on hemodialysis days)   fluticasone  (FLONASE ) 50 MCG/ACT nasal spray Place 2 sprays into both  nostrils daily.   fluticasone -salmeterol (ADVAIR ) 100-50 MCG/ACT AEPB Inhale 1 puff into the lungs 2 (two) times daily.   gabapentin  (NEURONTIN ) 100 MG capsule Take 1 capsule (100 mg total) by mouth 3 (three) times daily.   glipiZIDE  (GLUCOTROL  XL) 2.5 MG 24 hr tablet Take 1 tablet (2.5 mg total) by mouth daily as needed (for elevated BGL).   Homeopathic Products (EARACHE DROPS OT) Place 1 drop into both ears daily as  needed (for ear aches).   isosorbide  mononitrate (IMDUR ) 60 MG 24 hr tablet Take 1 tablet (60 mg total) by mouth daily.   lidocaine -prilocaine  (EMLA ) cream Apply a small amount to dialysis access one hour prior to dialysis. Wrap with plastic wrap.   linagliptin  (TRADJENTA ) 5 MG TABS tablet Take 1 tablet (5 mg total) by mouth daily.   loratadine  (CLARITIN ) 10 MG tablet Take 1 tablet (10 mg total) by mouth daily.   midodrine  (PROAMATINE ) 10 MG tablet Take 1 tablet (10 mg total) by mouth 3 (three) times a week. Take 30 minutes prior to HD (Patient taking differently: Take 10 mg by mouth See admin instructions. Take 10 mg by mouth prior to dialysis only if blood pressure is low)   Misc. Devices [employer]  Diagnosis - unstable gait   Misc. Devices MISC Fistula sleeve for R arm.  Diagnoses: End-stage renal disease on hemodialysis   Misc. Devices MISC 1. Incontinence supplies.  2. Briefs.  Diagnosis-urinary incontinence   Misc. Devices MISC Nepro dialysis protein drink.  Diagnosis end-stage renal disease (Patient taking differently: Take 10 g by mouth See admin instructions. Nepro dialysis protein drink (Diagnosis end-stage renal disease)- Mix 10 grams of powder and mix into food daily as needed for low protein)   Misc. Devices MISC Blood pressure monitor.  Diagnosis hypertension   Misc. Devices MISC Portable oxygen concentrator, 2 L of oxygen.  Diagnosis chronic respiratory failure.   montelukast  (SINGULAIR ) 10 MG tablet Take 1 tablet (10 mg total) by mouth at bedtime.   olopatadine   (PATANOL) 0.1 % ophthalmic solution Place 1 drop into both eyes 2 (two) times daily. (Patient taking differently: Place 1 drop into both eyes daily.)   ondansetron  (ZOFRAN ) 4 MG tablet Take 4 mg by mouth every 8 (eight) hours as needed for nausea or vomiting.   Throat Lozenges (HALLS COUGH DROPS MT) Use as directed 1 lozenge in the mouth or throat every 2 (two) hours as needed (for coughing).   No current facility-administered medications on file prior to visit.     Allergies  Allergen Reactions   Dust Mite Extract Cough   Peanut-Containing Drug Products Cough   Ace Inhibitors Cough   Insulins     Reports fainting, intolerance   Lovastatin Other (See Comments)    Generalized body pain    Review Of Systems:  Constitutional:   No  weight loss, night sweats,  Fevers, chills, fatigue, or  lassitude.  HEENT:   No headaches,  Difficulty swallowing,  Tooth/dental problems, or  Sore throat,                No sneezing, itching, ear ache, nasal congestion, post nasal drip,   CV:  No chest pain,  Orthopnea, PND, swelling in lower extremities, anasarca, dizziness, palpitations, syncope.   GI  No heartburn, indigestion, abdominal pain, nausea, vomiting, diarrhea, change in bowel habits, loss of appetite, bloody stools.   Resp: No shortness of breath with exertion or at rest.  No excess mucus, no productive cough,  No non-productive cough,  No coughing up of blood.  No change in color of mucus.  No wheezing.  No chest wall deformity  Skin: no rash or lesions.  GU: no dysuria, change in color of urine, no urgency or frequency.  No flank pain, no hematuria   MS:  No joint pain or swelling.  No decreased range of motion.  No back pain.  Psych:  No change in mood or affect. No depression  or anxiety.  No memory loss.   Vital Signs BP (!) 185/73 (BP Location: Left Arm, Cuff Size: Normal)   Pulse 70   Temp 98.9 F (37.2 C) (Oral)   Ht 5' 2 (1.575 m)   Wt 165 lb (74.8 kg)   LMP  (LMP  Unknown)   SpO2 95%   BMI 30.18 kg/m    Physical Exam:  General- No distress,  A&Ox3 ENT: No sinus tenderness, TM clear, pale nasal mucosa, no oral exudate,no post nasal drip, no LAN Cardiac: S1, S2, regular rate and rhythm, no murmur Chest: No wheeze/ rales/ dullness; no accessory muscle use, no nasal flaring, no sternal retractions Abd.: Soft Non-tender Ext: No clubbing cyanosis, edema Neuro:  normal strength Skin: No rashes, warm and dry Psych: normal mood and behavior   Assessment/Plan Desaturations with exertion to below 88%. Chronic cough CHF Plan Dr. Lavona qualified you for oxygen on 12/29/2023 at his office visit per his note You maintained your oxygen levels today on the POC, we will place an order through [employer] for the device. Monitor oxygen levels , goal is for sats of > 88-90% at all times. We will order PFT's, as Dr. Shelah wanted to have these done at your next visit. Follow up with Dr. Shelah in 3 months after PFT's to review results Continue to use albuterol  nebulizer as needed for shortness of breath, spells of coughing. Continue Flonase  (fluticasone ) nasal spray once daily Continue Astelin  (azelastine ) nasal spray 2 times a day Continue loratadine  10 mg once daily Continue Protonix  40 mg once daily.  Take this medication 1 hour around food. Please contact office for sooner follow up if symptoms do not improve or worsen or seek emergency care    I spent 40 minutes dedicated to the care of this patient on the date of this encounter to include pre-visit review of records, face-to-face time with the patient discussing conditions above, post visit ordering of testing, clinical documentation with the electronic health record, making appropriate referrals as documented, and communicating necessary information to the patient's healthcare team.    Lauraine JULIANNA Lites, NP 02/23/2024  9:10 AM

## 2024-02-24 DIAGNOSIS — Z992 Dependence on renal dialysis: Secondary | ICD-10-CM | POA: Diagnosis not present

## 2024-02-24 DIAGNOSIS — N186 End stage renal disease: Secondary | ICD-10-CM | POA: Diagnosis not present

## 2024-02-24 DIAGNOSIS — N2581 Secondary hyperparathyroidism of renal origin: Secondary | ICD-10-CM | POA: Diagnosis not present

## 2024-02-25 ENCOUNTER — Encounter (HOSPITAL_COMMUNITY): Payer: Self-pay

## 2024-02-25 ENCOUNTER — Other Ambulatory Visit (HOSPITAL_COMMUNITY): Payer: Self-pay

## 2024-02-26 DIAGNOSIS — N2581 Secondary hyperparathyroidism of renal origin: Secondary | ICD-10-CM | POA: Diagnosis not present

## 2024-02-26 DIAGNOSIS — N186 End stage renal disease: Secondary | ICD-10-CM | POA: Diagnosis not present

## 2024-02-26 DIAGNOSIS — Z992 Dependence on renal dialysis: Secondary | ICD-10-CM | POA: Diagnosis not present

## 2024-02-29 ENCOUNTER — Telehealth: Payer: Self-pay | Admitting: Family Medicine

## 2024-02-29 DIAGNOSIS — N2581 Secondary hyperparathyroidism of renal origin: Secondary | ICD-10-CM | POA: Diagnosis not present

## 2024-02-29 DIAGNOSIS — Z992 Dependence on renal dialysis: Secondary | ICD-10-CM | POA: Diagnosis not present

## 2024-02-29 DIAGNOSIS — N186 End stage renal disease: Secondary | ICD-10-CM | POA: Diagnosis not present

## 2024-02-29 NOTE — Telephone Encounter (Signed)
 Contacted pt vm full to confirmed appt

## 2024-03-01 ENCOUNTER — Other Ambulatory Visit (HOSPITAL_COMMUNITY): Payer: Self-pay

## 2024-03-01 ENCOUNTER — Ambulatory Visit: Attending: Family Medicine | Admitting: Family Medicine

## 2024-03-01 ENCOUNTER — Other Ambulatory Visit: Payer: Self-pay

## 2024-03-01 VITALS — BP 134/69 | HR 68 | Ht 62.0 in | Wt 166.8 lb

## 2024-03-01 DIAGNOSIS — I120 Hypertensive chronic kidney disease with stage 5 chronic kidney disease or end stage renal disease: Secondary | ICD-10-CM

## 2024-03-01 DIAGNOSIS — I517 Cardiomegaly: Secondary | ICD-10-CM | POA: Diagnosis not present

## 2024-03-01 DIAGNOSIS — N185 Chronic kidney disease, stage 5: Secondary | ICD-10-CM

## 2024-03-01 DIAGNOSIS — E1122 Type 2 diabetes mellitus with diabetic chronic kidney disease: Secondary | ICD-10-CM

## 2024-03-01 DIAGNOSIS — Z7984 Long term (current) use of oral hypoglycemic drugs: Secondary | ICD-10-CM

## 2024-03-01 DIAGNOSIS — R053 Chronic cough: Secondary | ICD-10-CM | POA: Diagnosis not present

## 2024-03-01 DIAGNOSIS — Z992 Dependence on renal dialysis: Secondary | ICD-10-CM

## 2024-03-01 DIAGNOSIS — J9610 Chronic respiratory failure, unspecified whether with hypoxia or hypercapnia: Secondary | ICD-10-CM

## 2024-03-01 DIAGNOSIS — I480 Paroxysmal atrial fibrillation: Secondary | ICD-10-CM | POA: Diagnosis not present

## 2024-03-01 DIAGNOSIS — N186 End stage renal disease: Secondary | ICD-10-CM

## 2024-03-01 DIAGNOSIS — E1149 Type 2 diabetes mellitus with other diabetic neurological complication: Secondary | ICD-10-CM

## 2024-03-01 LAB — POCT GLYCOSYLATED HEMOGLOBIN (HGB A1C): HbA1c, POC (controlled diabetic range): 6.7 % (ref 0.0–7.0)

## 2024-03-01 MED ORDER — AMLODIPINE BESYLATE 2.5 MG PO TABS
2.5000 mg | ORAL_TABLET | Freq: Every day | ORAL | 1 refills | Status: DC
  Filled 2024-03-01: qty 90, 90d supply, fill #0

## 2024-03-01 MED ORDER — ATORVASTATIN CALCIUM 80 MG PO TABS
80.0000 mg | ORAL_TABLET | Freq: Every day | ORAL | 1 refills | Status: DC
Start: 2024-03-01 — End: 2024-06-29
  Filled 2024-03-01: qty 90, 90d supply, fill #0
  Filled 2024-06-09: qty 90, 90d supply, fill #1

## 2024-03-01 NOTE — Progress Notes (Signed)
 Subjective:  Patient ID: Paula [name] , female    DOB: 1945/10/15  Age: 78 y.o. MRN: [id number]  CC: Medical Management of Chronic Issues (Discuss BP reading/Discuss Calcium  Acetate medication)     Discussed the use of AI scribe software for clinical note transcription with the patient, who gave verbal consent to proceed.  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 She is accompanied by her daughter.  She experiences significant hypotensive episodes during dialysis, with symptoms of syncope. At home, systolic blood pressure readings drop to 91 mmHg. On dialysis days, she delays taking amlodipine  until the evening to manage these drops. Her medication regimen includes carvedilol  and midodrine , the latter used as needed during dialysis to raise blood pressure.  She uses oxygen as needed, primarily at night which she recently commenced.  Last pulmonary visit was 1 week ago.  She continues to have a chronic cough despite use of antitussives and antihistamines, nasal spray.  She requires a refill for atorvastatin  and faces insurance issues with calcium  acetate. Her A1c is 6.7, and she is under endocrinology care for diabetes.  For her A-fib she has been on Eliquis . She denies chest pain or shortness of breath and continues using her inhaler for breathing issues.    Past Medical History:  Diagnosis Date   Allergic rhinitis    Allergy  1/12000   Arthritis    Asthma     Brain aneurysm    Cough    Diabetes mellitus    Diastolic CHF, chronic (HCC) 10/11/2011   ESRD on hemodialysis (HCC)    MWF   GERD (gastroesophageal reflux disease)    History of colon polyps 2012   tubular adenoma    Hyperlipidemia    Hypertension    Neuropathy 10/11/2011   PONV (postoperative nausea and vomiting)    one time after lymph node surgery    Past Surgical History:  Procedure Laterality Date   AV FISTULA PLACEMENT Right 10/15/2021   Procedure: ARTERIOVENOUS (AV) FISTULA  WITH PLACEMENT OF GORE-TEX STRETCH  GRAFT (4-76mmx45cm);  Surgeon: Eliza Lonni RAMAN, MD;  [facility];  Service: Vascular;  Laterality: Right;   CATARACT EXTRACTION     right eye   IR 3D INDEPENDENT WKST  12/14/2017   IR ANGIO INTRA EXTRACRAN SEL COM CAROTID INNOMINATE BILAT MOD SED  09/15/2017   IR ANGIO INTRA EXTRACRAN SEL COM CAROTID INNOMINATE UNI L MOD SED  12/14/2017   IR ANGIO VERTEBRAL SEL VERTEBRAL BILAT MOD SED  09/15/2017   IR RADIOLOGIST EVAL & MGMT  09/10/2017   IR RADIOLOGIST EVAL & MGMT  10/19/2017   lymphatic mass surgery     NASAL TURBINATE REDUCTION     RADIOLOGY WITH ANESTHESIA N/A 12/14/2017   Procedure: RADIOLOGY WITH ANESTHESIA EMBOLIZATION;  Surgeon: Dolphus Carrion, MD;  Location: [facility] OR;  Service: Radiology;  Laterality: N/A;   VIDEO BRONCHOSCOPY Bilateral 11/18/2017   Procedure: VIDEO BRONCHOSCOPY WITHOUT FLUORO;  Surgeon: Shelah Lamar RAMAN, MD;  Location: [facility];  Service: Cardiopulmonary;  Laterality: Bilateral;    Family History  Problem Relation Age of  Onset   Hypertension Mother    Asthma Sister    Colon cancer Neg Hx    Allergic rhinitis Neg Hx    Eczema Neg Hx     Social History   Socioeconomic History   Marital status: Divorced    Spouse name: Not on file   Number of children: 5   Years of education: Not on file   Highest education level: Master's degree (e.g., MA, MS, MEng, MEd, MSW, MBA)  Occupational History   Occupation: Unemployed   Tobacco Use   Smoking status: Never    Passive exposure: Never   Smokeless tobacco: Never  Vaping Use   Vaping status: Never Used  Substance and Sexual Activity   Alcohol  use: No   Drug use: No   Sexual activity: Not Currently    Birth control/protection: Post-menopausal, None  Other Topics Concern   Not on file  Social History Narrative   Lives alone.  5 children with two deceased.     Social Drivers of Health   Financial Resource Strain: High Risk (03/01/2024)   Overall Financial Resource Strain (CARDIA)    Difficulty of Paying Living Expenses: Hard  Food Insecurity: Food Insecurity Present (03/01/2024)   Hunger Vital Sign    Worried About Running Out of Food in the Last Year: Often true    Ran Out of Food in the Last Year: Often true  Transportation Needs: Unmet Transportation Needs (03/01/2024)   PRAPARE - Administrator, Civil Service (Medical): Yes    Lack of Transportation (Non-Medical): Yes  Physical Activity: Insufficiently Active (03/01/2024)   Exercise Vital Sign    Days of Exercise per Week: 2 days    Minutes of Exercise per Session: 10 min  Stress: No Stress Concern Present (03/01/2024)   [employer] of Occupational Health - Occupational Stress Questionnaire    Feeling of Stress: Not at all  Social Connections: Unknown (03/01/2024)   Social Connection and Isolation Panel    Frequency of Communication with Friends and Family: More than three times a week    Frequency of Social Gatherings with Friends and Family: More than three times a week    Attends Religious Services: More than 4 times per year    Active Member of [employer] or Organizations: Patient declined    Attends Banker Meetings: Not on file    Marital Status: Widowed    Allergies  Allergen Reactions   Dust Mite Extract Cough   Peanut-Containing Drug Products Cough   Ace Inhibitors Cough   Insulins     Reports fainting, intolerance   Lovastatin Other (See Comments)     Generalized body pain    Outpatient Medications Prior to Visit  Medication Sig Dispense Refill   ACCU-CHEK GUIDE TEST test strip USE 1 STRIP TO CHECK GLUCOSE TWICE DAILY 100 each 0   acetaminophen  (TYLENOL ) 500 MG tablet Take 500 mg by mouth every 6 (six) hours as needed for fever, moderate pain or mild pain.     albuterol  (PROAIR  HFA) 108 (90 Base) MCG/ACT inhaler Inhale 1-2 puffs into the lungs every 6 (six) hours as needed for wheezing or shortness of breath. 6.7 g 2   albuterol  (PROVENTIL ) (2.5 MG/3ML) 0.083% nebulizer solution Take 3 mLs (2.5 mg total) by nebulization every 6 (six) hours as needed for wheezing or shortness of breath. 75 mL 1   apixaban  (ELIQUIS ) 5 MG TABS tablet Take 1 tablet (5 mg total) by mouth 2 (two) times daily. 180  tablet 1   azelastine  (ASTELIN ) 0.1 % nasal spray Place 2 sprays into both nostrils 2 (two) times daily. Use in each nostril as directed 30 mL 12   benzonatate  (TESSALON ) 100 MG capsule Take 1 capsule by mouth three times a day as need for cough 90 capsule 1   calcium  acetate (PHOSLO ) 667 MG capsule Take 1 capsule (667 mg total) by mouth 3 (three) times daily with meals. 270 capsule 2   carvedilol  (COREG ) 12.5 MG tablet Take 1 tablet (12.5 mg total) by mouth 2 (two) times daily with a meal. (Take once a day in the evening on hemodialysis days) 180 tablet 0   fluticasone  (FLONASE ) 50 MCG/ACT nasal spray Place 2 sprays into both nostrils daily. 16 g 12   fluticasone -salmeterol (ADVAIR ) 100-50 MCG/ACT AEPB Inhale 1 puff into the lungs 2 (two) times daily. 60 each 2   gabapentin  (NEURONTIN ) 100 MG capsule Take 1 capsule (100 mg total) by mouth 3 (three) times daily. 270 capsule 1   glipiZIDE  (GLUCOTROL  XL) 2.5 MG 24 hr tablet Take 1 tablet (2.5 mg total) by mouth daily as needed (for elevated BGL). 90 tablet 1   Homeopathic Products (EARACHE DROPS OT) Place 1 drop into both ears daily as needed (for ear aches).     isosorbide  mononitrate (IMDUR ) 60 MG 24 hr  tablet Take 1 tablet (60 mg total) by mouth daily. 90 tablet 0   lidocaine -prilocaine  (EMLA ) cream Apply a small amount to dialysis access one hour prior to dialysis. Wrap with plastic wrap. 30 g PRN   linagliptin  (TRADJENTA ) 5 MG TABS tablet Take 1 tablet (5 mg total) by mouth daily. 90 tablet 3   loratadine  (CLARITIN ) 10 MG tablet Take 1 tablet (10 mg total) by mouth daily. 90 tablet 1   midodrine  (PROAMATINE ) 10 MG tablet Take 1 tablet (10 mg total) by mouth 3 (three) times a week. Take 30 minutes prior to HD (Patient taking differently: Take 10 mg by mouth See admin instructions. Take 10 mg by mouth prior to dialysis only if blood pressure is low) 15 tablet 3   Misc. Devices [employer]  Diagnosis - unstable gait 1 each 0   Misc. Devices MISC Fistula sleeve for R arm.  Diagnoses: End-stage renal disease on hemodialysis 1 each 0   Misc. Devices MISC 1. Incontinence supplies.  2. Briefs.  Diagnosis-urinary incontinence 1 each 11   Misc. Devices MISC Nepro dialysis protein drink.  Diagnosis end-stage renal disease (Patient taking differently: Take 10 g by mouth See admin instructions. Nepro dialysis protein drink (Diagnosis end-stage renal disease)- Mix 10 grams of powder and mix into food daily as needed for low protein) 30 each 11   Misc. Devices MISC Blood pressure monitor.  Diagnosis hypertension 1 each 0   Misc. Devices MISC Portable oxygen concentrator, 2 L of oxygen.  Diagnosis chronic respiratory failure. 1 each 0   montelukast  (SINGULAIR ) 10 MG tablet Take 1 tablet (10 mg total) by mouth at bedtime. 90 tablet 1   olopatadine  (PATANOL) 0.1 % ophthalmic solution Place 1 drop into both eyes 2 (two) times daily. (Patient taking differently: Place 1 drop into both eyes daily.) 5 mL 12   ondansetron  (ZOFRAN ) 4 MG tablet Take 4 mg by mouth every 8 (eight) hours as needed for nausea or vomiting.     Throat Lozenges (HALLS COUGH DROPS MT) Use as directed 1 lozenge in the mouth or throat every 2  (two) hours as needed (for coughing).  amLODipine  (NORVASC ) 5 MG tablet Take 1 tablet (5 mg total) by mouth daily. 90 tablet 1   atorvastatin  (LIPITOR ) 80 MG tablet Take 1 tablet (80 mg total) by mouth daily. 90 tablet 0   No facility-administered medications prior to visit.     ROS Review of Systems  Constitutional:  Negative for activity change and appetite change.  HENT:  Negative for sinus pressure and sore throat.   Respiratory:  Positive for cough. Negative for chest tightness, shortness of breath and wheezing.   Cardiovascular:  Negative for chest pain and palpitations.  Gastrointestinal:  Negative for abdominal distention, abdominal pain and constipation.  Genitourinary: Negative.   Musculoskeletal: Negative.   Psychiatric/Behavioral:  Negative for behavioral problems and dysphoric mood.     Objective:  BP 134/69   Pulse 68   Ht 5' 2 (1.575 m)   Wt 166 lb 12.8 oz (75.7 kg)   LMP  (LMP Unknown)   SpO2 95%   BMI 30.51 kg/m      03/01/2024    1:48 PM 02/23/2024    9:06 AM 02/23/2024    8:40 AM  BP/Weight  Systolic BP 134 185 184  Diastolic BP 69 73 71  Wt. (Lbs) 166.8  165  BMI 30.51 kg/m2  30.18 kg/m2      Physical Exam Constitutional:      Appearance: She is well-developed.  Cardiovascular:     Rate and Rhythm: Normal rate.     Heart sounds: Normal heart sounds. No murmur heard. Pulmonary:     Effort: Pulmonary effort is normal.     Breath sounds: Normal breath sounds. No wheezing or rales.  Chest:     Chest wall: No tenderness.  Abdominal:     General: Bowel sounds are normal. There is no distension.     Palpations: Abdomen is soft. There is no mass.     Tenderness: There is no abdominal tenderness.  Musculoskeletal:        General: Normal range of motion.     Right lower leg: No edema.     Left lower leg: No edema.  Neurological:     Mental Status: She is alert and oriented to person, place, and time.  Psychiatric:        Mood and Affect:  Mood normal.        Latest Ref Rng & Units 01/27/2024    3:49 PM 10/13/2023    5:11 AM 10/12/2023    4:25 AM  CMP  Glucose 70 - 99 mg/dL  813  815   BUN 8 - 23 mg/dL  31  60   Creatinine 9.55 - 1.00 mg/dL  4.53  2.31   Sodium 864 - 145 mmol/L  132  132   Potassium 3.5 - 5.1 mmol/L  3.3  3.6   Chloride 98 - 111 mmol/L  93  92   CO2 22 - 32 mmol/L  28  28   Calcium  8.9 - 10.3 mg/dL  8.5  8.6   Total Protein g/dL CANCELED  6.9  6.2   Total Bilirubin 0.0 - 1.2 mg/dL  0.5  0.9   Alkaline Phos 38 - 126 U/L  90  87   AST 15 - 41 U/L  19  11   ALT 0 - 44 U/L  17  14     Lipid Panel     Component Value Date/Time   CHOL 106 09/01/2021 1041   CHOL 121 06/17/2021 0904   TRIG 61 09/01/2021  1041   HDL 46 09/01/2021 1041   HDL 46 06/17/2021 0904   CHOLHDL 2.3 09/01/2021 1041   VLDL 12 09/01/2021 1041   LDLCALC 48 09/01/2021 1041   LDLCALC 61 06/17/2021 0904    CBC    Component Value Date/Time   WBC 6.8 10/13/2023 0511   RBC 3.38 (L) 10/13/2023 0511   HGB 10.3 (L) 10/13/2023 0511   HGB 12.4 09/02/2018 1211   HCT 29.9 (L) 10/13/2023 0511   HCT 37.3 09/02/2018 1211   PLT 154 10/13/2023 0511   PLT 203 09/02/2018 1211   MCV 88.5 10/13/2023 0511   MCV 87 09/02/2018 1211   MCH 30.5 10/13/2023 0511   MCHC 34.4 10/13/2023 0511   RDW 17.8 (H) 10/13/2023 0511   RDW 13.1 09/02/2018 1211   LYMPHSABS 1.1 10/11/2023 1530   LYMPHSABS 1.2 09/02/2018 1211   MONOABS 0.7 10/11/2023 1530   EOSABS 0.4 10/11/2023 1530   EOSABS 0.1 09/02/2018 1211   BASOSABS 0.0 10/11/2023 1530   BASOSABS 0.0 09/02/2018 1211    Lab Results  Component Value Date   HGBA1C 6.7 03/01/2024    Lab Results  Component Value Date   HGBA1C 6.7 03/01/2024   HGBA1C 7.4 (H) 08/17/2023   HGBA1C 6.5 04/16/2023      1. Type 2 diabetes mellitus with other neurologic complication, without long-term current use of insulin  (HCC) (Primary) Controlled with A1c of 6.7 Currently under endocrine care and has been on  Tradjenta  Counseled on Diabetic diet, the healthy plate, 849 minutes of moderate intensity exercise/week Blood sugar logs with fasting goals of 80-120 mg/dl, random of less than 819 and in the event of sugars less than 60 mg/dl or greater than 599 mg/dl encouraged to notify the clinic. Advised on the need for annual eye exams, annual foot exams, Pneumonia vaccine. - POCT glycosylated hemoglobin (Hb A1C) - atorvastatin  (LIPITOR ) 80 MG tablet; Take 1 tablet (80 mg total) by mouth daily.  Dispense: 90 tablet; Refill: 1  2. ESRD on dialysis [facility]) Continue hemodialysis per schedule  3. Hypertension associated with stage 5 chronic kidney disease due to type 2 diabetes mellitus (HCC) Continues to experience hypotension especially on dialysis days Amlodipine  dose decreased from 5 mg to 2.5 mg Use midodrine  as needed for hypotension - amLODipine  (NORVASC ) 2.5 MG tablet; Take 1 tablet (2.5 mg total) by mouth daily.  Dispense: 90 tablet; Refill: 1  4. Paroxysmal A-fib (HCC) Currently in sinus rhythm Continue anticoagulation with Eliquis  and rate control with beta-blocker  5. Chronic respiratory failure, unspecified whether with hypoxia or hypercapnia (HCC) Uses 2 L oxygen as needed  6.  Chronic cough Continue with Singulair , antihistamine, antitussive  She has undergone PFT Follow-up with pulmonary   Meds ordered this encounter  Medications   amLODipine  (NORVASC ) 2.5 MG tablet    Sig: Take 1 tablet (2.5 mg total) by mouth daily.    Dispense:  90 tablet    Refill:  1    Dose decrease   atorvastatin  (LIPITOR ) 80 MG tablet    Sig: Take 1 tablet (80 mg total) by mouth daily.    Dispense:  90 tablet    Refill:  1    Follow-up: Return in about 6 months (around 09/01/2024) for Chronic medical conditions.       Corrina Sabin, MD, FAAFP. [facility] [place], [place] [phone]   03/01/2024, 2:32 PM

## 2024-03-01 NOTE — Patient Instructions (Signed)
 VISIT SUMMARY:  Today, we discussed your blood pressure management during dialysis, your medications, and other health concerns. You were accompanied by your daughter. We reviewed your current symptoms, including swelling and fainting during dialysis, and made adjustments to your medication regimen to help manage these issues. We also addressed your need for a refill of atorvastatin  and discussed your insurance issues with calcium  acetate.  YOUR PLAN:  -HYPOTENSION DURING DIALYSIS: Hypotension means low blood pressure. To help manage your low blood pressure during dialysis, we have reduced your amlodipine  dose from 5 mg to 2.5 mg. Please continue taking carvedilol  at the current dose and use midodrine  as prescribed during dialysis.  -END-STAGE RENAL DISEASE ON HEMODIALYSIS: End-stage renal disease is the final stage of chronic kidney disease where the kidneys no longer function properly. You are on hemodialysis and have an issue with calcium  acetate for phosphorus management. We will discuss this issue with your nephrologist.  -PAROXYSMAL ATRIAL FIBRILLATION: Paroxysmal atrial fibrillation is a type of irregular heartbeat that comes and goes. You are currently taking apixaban  to manage this condition. Please continue taking apixaban  5 mg orally twice daily.  -TYPE 2 DIABETES MELLITUS: Type 2 diabetes is a condition that affects the way your body processes blood sugar. Your A1c is 6.7, which indicates good control. Please continue your diabetes management with Dr. Rosan, your endocrinologist.  -CHRONIC RESPIRATORY FAILURE:is a chronic lung disease that makes it hard to breathe. Please continue using your oxygen as needed   -HYPERLIPIDEMIA: Hyperlipidemia means having high levels of fats in your blood. We will refill your atorvastatin  prescription to help manage this condition.  INSTRUCTIONS:  Please follow up with your nephrologist regarding the calcium  acetate issue. Continue to monitor your blood  pressure at home and report any significant changes or symptoms. Make sure to take your medications as prescribed and keep your follow-up appointments with your healthcare providers.

## 2024-03-02 DIAGNOSIS — N2581 Secondary hyperparathyroidism of renal origin: Secondary | ICD-10-CM | POA: Diagnosis not present

## 2024-03-02 DIAGNOSIS — N186 End stage renal disease: Secondary | ICD-10-CM | POA: Diagnosis not present

## 2024-03-02 DIAGNOSIS — Z992 Dependence on renal dialysis: Secondary | ICD-10-CM | POA: Diagnosis not present

## 2024-03-03 LAB — MULTIPLE MYELOMA PANEL, SERUM
Albumin SerPl Elph-Mcnc: 4 g/dL (ref 2.9–4.4)
Albumin/Glob SerPl: 1.2 (ref 0.7–1.7)
Alpha 1: 0.2 g/dL (ref 0.0–0.4)
Alpha2 Glob SerPl Elph-Mcnc: 0.6 g/dL (ref 0.4–1.0)
B-Globulin SerPl Elph-Mcnc: 0.9 g/dL (ref 0.7–1.3)
Gamma Glob SerPl Elph-Mcnc: 1.7 g/dL (ref 0.4–1.8)
Globulin, Total: 3.4 g/dL (ref 2.2–3.9)
IgA/Immunoglobulin A, Serum: 167 mg/dL (ref 64–422)
IgG (Immunoglobin G), Serum: 1720 mg/dL — ABNORMAL HIGH (ref 586–1602)
IgM (Immunoglobulin M), Srm: 24 mg/dL — ABNORMAL LOW (ref 26–217)
Total Protein: 7.4 g/dL (ref 6.0–8.5)

## 2024-03-04 DIAGNOSIS — Z992 Dependence on renal dialysis: Secondary | ICD-10-CM | POA: Diagnosis not present

## 2024-03-04 DIAGNOSIS — N2581 Secondary hyperparathyroidism of renal origin: Secondary | ICD-10-CM | POA: Diagnosis not present

## 2024-03-04 DIAGNOSIS — N186 End stage renal disease: Secondary | ICD-10-CM | POA: Diagnosis not present

## 2024-03-05 ENCOUNTER — Other Ambulatory Visit: Payer: Self-pay | Admitting: Family Medicine

## 2024-03-05 ENCOUNTER — Other Ambulatory Visit (HOSPITAL_COMMUNITY): Payer: Self-pay

## 2024-03-05 DIAGNOSIS — I5032 Chronic diastolic (congestive) heart failure: Secondary | ICD-10-CM

## 2024-03-05 DIAGNOSIS — E1122 Type 2 diabetes mellitus with diabetic chronic kidney disease: Secondary | ICD-10-CM

## 2024-03-05 DIAGNOSIS — I651 Occlusion and stenosis of basilar artery: Secondary | ICD-10-CM

## 2024-03-07 ENCOUNTER — Other Ambulatory Visit: Payer: Self-pay

## 2024-03-07 ENCOUNTER — Other Ambulatory Visit (HOSPITAL_COMMUNITY): Payer: Self-pay

## 2024-03-07 DIAGNOSIS — Z992 Dependence on renal dialysis: Secondary | ICD-10-CM | POA: Diagnosis not present

## 2024-03-07 DIAGNOSIS — N186 End stage renal disease: Secondary | ICD-10-CM | POA: Diagnosis not present

## 2024-03-07 DIAGNOSIS — N2581 Secondary hyperparathyroidism of renal origin: Secondary | ICD-10-CM | POA: Diagnosis not present

## 2024-03-07 MED ORDER — ISOSORBIDE MONONITRATE ER 60 MG PO TB24
60.0000 mg | ORAL_TABLET | Freq: Every day | ORAL | 0 refills | Status: DC
  Filled 2024-03-07: qty 90, 90d supply, fill #0

## 2024-03-07 MED ORDER — CARVEDILOL 12.5 MG PO TABS
12.5000 mg | ORAL_TABLET | Freq: Two times a day (BID) | ORAL | 0 refills | Status: DC
Start: 2024-03-07 — End: 2024-03-29
  Filled 2024-03-07: qty 180, 90d supply, fill #0

## 2024-03-07 MED ORDER — LORATADINE 10 MG PO TABS
10.0000 mg | ORAL_TABLET | Freq: Every day | ORAL | 1 refills | Status: DC
  Filled 2024-03-07 – 2024-05-15 (×2): qty 90, 90d supply, fill #0

## 2024-03-08 ENCOUNTER — Ambulatory Visit: Admitting: Physical Therapy

## 2024-03-08 ENCOUNTER — Telehealth (HOSPITAL_COMMUNITY): Payer: Self-pay

## 2024-03-08 ENCOUNTER — Encounter: Payer: Self-pay | Admitting: Physical Therapy

## 2024-03-08 DIAGNOSIS — R262 Difficulty in walking, not elsewhere classified: Secondary | ICD-10-CM

## 2024-03-08 DIAGNOSIS — R2689 Other abnormalities of gait and mobility: Secondary | ICD-10-CM | POA: Diagnosis not present

## 2024-03-08 DIAGNOSIS — M5459 Other low back pain: Secondary | ICD-10-CM

## 2024-03-08 DIAGNOSIS — M6281 Muscle weakness (generalized): Secondary | ICD-10-CM

## 2024-03-08 DIAGNOSIS — M25551 Pain in right hip: Secondary | ICD-10-CM

## 2024-03-08 NOTE — Therapy (Signed)
 OUTPATIENT PHYSICAL THERAPY THORACOLUMBAR   Patient Name: Paula [name]  MRN: [id number] DOB:[birth year], 78 y.o., female Today's Date: 03/08/2024  END OF SESSION:  PT End of Session - 03/08/24 1150     Visit Number 9    Date for PT Re-Evaluation 03/17/24    PT Start Time 1150    PT Stop Time 1230    PT Time Calculation (min) 40 min    Activity Tolerance Patient tolerated treatment well    Behavior During Therapy [facility] for tasks assessed/performed             Past Medical History:  Diagnosis Date   Allergic rhinitis    Allergy  1/12000   Arthritis    Asthma    Brain aneurysm    Cough    Diabetes mellitus    Diastolic CHF, chronic (HCC) 10/11/2011   ESRD on hemodialysis (HCC)    MWF   GERD (gastroesophageal reflux disease)    History of colon polyps 2012   tubular adenoma    Hyperlipidemia    Hypertension    Neuropathy 10/11/2011   PONV (postoperative nausea and vomiting)    one time after lymph node surgery   Past Surgical History:  Procedure Laterality Date   AV FISTULA PLACEMENT Right 10/15/2021   Procedure: ARTERIOVENOUS (AV) FISTULA  WITH PLACEMENT OF GORE-TEX STRETCH  GRAFT (4-43mmx45cm);  Surgeon: Eliza Lonni RAMAN, MD;  Location: [facility] OR;  Service: Vascular;  Laterality: Right;   CATARACT EXTRACTION     right eye   IR 3D INDEPENDENT WKST  12/14/2017   IR ANGIO INTRA EXTRACRAN SEL COM CAROTID INNOMINATE BILAT MOD SED  09/15/2017   IR ANGIO INTRA EXTRACRAN SEL COM CAROTID INNOMINATE UNI L MOD SED  12/14/2017   IR ANGIO VERTEBRAL SEL VERTEBRAL BILAT MOD SED  09/15/2017   IR RADIOLOGIST EVAL & MGMT  09/10/2017   IR RADIOLOGIST EVAL & MGMT  10/19/2017   lymphatic mass surgery     NASAL TURBINATE REDUCTION     RADIOLOGY WITH ANESTHESIA N/A 12/14/2017   Procedure: RADIOLOGY WITH ANESTHESIA EMBOLIZATION;  Surgeon: Dolphus Carrion, MD;  Location: [facility] OR;  Service: Radiology;  Laterality: N/A;   VIDEO BRONCHOSCOPY Bilateral 11/18/2017   Procedure: VIDEO  BRONCHOSCOPY WITHOUT FLUORO;  Surgeon: Shelah Lamar RAMAN, MD;  Location: [facility] ENDOSCOPY;  Service: Cardiopulmonary;  Laterality: Bilateral;   Patient Active Problem List   Diagnosis Date Noted   Hypoxemia 12/29/2023   Abnormal electrocardiogram (ECG) (EKG) 12/29/2023   Left ventricular hypertrophy 12/29/2023   CHF (congestive heart failure) (HCC) 10/12/2023   Blurry vision 10/11/2023   Anemia due to chronic kidney disease 10/11/2023   Acute respiratory failure with hypoxia (HCC) 08/17/2023   PNA (pneumonia) 08/17/2023   Volume overload 08/17/2023   Acute hypoxic respiratory failure (HCC) 08/17/2023   SNHL (sensory-neural hearing loss), 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   ESRD on dialysis (HCC) 11/20/2022   Mold exposure 08/14/2022   Opioid withdrawal (HCC) 08/14/2022   ILD (interstitial lung disease) (HCC) 08/14/2022   Secondary hyperparathyroidism, renal (HCC) 08/14/2022   Paroxysmal A-fib (HCC) 05/20/2022   Hypertrophy of nasal turbinates 09/05/2021   Chronic kidney disease, stage 5 (HCC)    Aortic atherosclerosis (HCC) 06/24/2021   Chronic rhinitis 10/01/2017   Seasonal allergic conjunctivitis 10/01/2017   Aneurysm (HCC) 09/24/2017   Acute metabolic encephalopathy 09/02/2017   Allergic rhinitis 08/20/2017   Palpitations 07/10/2016   Gout 01/23/2016   History of cataract 02/27/2015   Seasonal and perennial allergic rhinitis 02/15/2012   Chronic diastolic  CHF (congestive heart failure) (HCC) 10/11/2011   GERD (gastroesophageal reflux disease)    Type 2 diabetes mellitus without complication, without long-term current use of insulin  (HCC) 07/03/2010   Hyperlipidemia 07/03/2010   Essential hypertension 07/03/2010    PCP: Corrina Sabin  REFERRING PROVIDER: Corrina Sabin  REFERRING DIAG: M54.50,G89.29 (ICD-10-CM) - Chronic right-sided low back pain without sciaticaM25.551 (ICD-10-CM) - Right hip pain  Rationale for Evaluation and Treatment: Rehabilitation  THERAPY DIAG:  Muscle  weakness (generalized)  Difficulty in walking, not elsewhere classified  Pain in right hip  Other low back pain  ONSET DATE: 10/11/23  SUBJECTIVE:                                                                                                                                                                                           SUBJECTIVE STATEMENT: My back is a little better, but I can't stand for long  PERTINENT HISTORY:  Paula [name]  is a 78 year old female with  a history of type 2 diabetes mellitus (A1c 76.9), hypertension, hypercholesterolemia, allergic rhinitis, severe upper airway cough syndrome, stage V CKD (managed by Dr [facility], currently on hemodialysis Tuesday, Thursday and Saturday), 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   She experiences pain in the lower back, specifically in the right hip and lower back, which makes standing for more than two minutes difficult. The pain is localized to the hip area without radiation down the leg. She is receiving physical therapy at home and is interested in outpatient therapy. Tylenol  and a heating pad provide pain relief. Hip x-ray from 2024 revealed degenerative changes send pubic symphysis, both hips, SI joints and lower lumbar spine.  She fell from her bed last Monday, resulting in pain on the left side of her head and right shoulder, which was swollen but has improved. No current swelling or pain with pressure. She experiences dizziness at times and uses a cane occasionally.    PAIN:  Are you having pain? Yes: NPRS scale: 6/10 low back pain Pain location: R hip and midline low back  Pain description: not there all the time, if I am sitting I am okay Aggravating factors: when I get up, when I am standing, walking  Relieving  factors: Tylenol  and gabapentin ,   PRECAUTIONS: Fall  RED FLAGS: None   WEIGHT BEARING RESTRICTIONS: No  FALLS:  Has patient fallen in last 6 months? Yes. Number of falls 2 knee gave out in March,  then 1 time fell out of the bed   LIVING ENVIRONMENT: Lives with: lives with their family Lives in: House/apartment Stairs: Yes: Internal: 14 steps; on right going up and the wall on the other side Has following equipment at home: Single point cane and Walker - 2 wheeled  OCCUPATION: N/A  PLOF: Independent with basic ADLs  PATIENT GOALS: to get rid of the pain, be able to walk and stand for longer than 2 mins without the pain  NEXT MD VISIT: 03/01/24  OBJECTIVE:  Note: Objective measures were completed at Evaluation unless otherwise noted.  DIAGNOSTIC FINDINGS:  X ray ordered for hips, lumbar, and knees   COGNITION: Overall cognitive status: Within functional limits for tasks assessed     SENSATION: WFL  POSTURE: rounded shoulders, forward head, and increased thoracic kyphosis  PALPATION: TTP and pain lumbar spine   LUMBAR ROM:   AROM eval 01/14/24 01/21/24 03/08/24  Flexion Distal shin with pain WFL    Extension 25% available Limited 25% Limited 25% WFL  Right lateral flexion Mid thigh with pain Limited 25% Limited 25% 25% limited   Left lateral flexion Mid thigh with pain WFL    Right rotation 75% available with pain WFL    Left rotation 75% available with pain WFL     (Blank rows = not tested)  LOWER EXTREMITY ROM:   grossly WFL   LOWER EXTREMITY MMT:  grossly 4-/5 BLE    FUNCTIONAL TESTS:  5 times sit to stand: 43s from elevated mat and needing UE push off, loses balance on to heels  Timed up and go (TUG): 24s   GAIT: Distance walked: in clinic distances Assistive device utilized: None Level of assistance: CGA Comments: furniture walking, or reaches out to grab daughter's hand to walk  TREATMENT DATE:  03/08/24 NuStep L 5 x 6 min  HS curls 20lb  2x10 Leg Ext 5lb 2x10 Rows & Ext green 2x10 Passive stretching to bilateral HS, piriformis, Glute, ITB  02/16/24 NuStep Leg ext HS curls  STS on airex Walking on beam  Step ups   02/11/24 NuStep L5x71mins Shoulder ext 5# 2x10 Step ups 4  Feet on ball rotations and knees to chest x10 Bridges 2x10 Passive stretching HS, glutes, SKTC, LTR STS 2x10  01/28/24 NuStep L5x20mins  STS on airex 2x10 HS curls 20lb 2x10 Leg Ext 10lb 2x10 Shoulder ext and rows green band 2x10 Passive stretching- HS, SKTC, LTR     01/21/24 NuStep L5 x 6 min Goals  Lumbar ROM 5x S2S 17.77 LE did push against table on first rep HS curls 20lb 2x10 Leg Ext 10lb 2x10 Shoulder Ext 5lb 2x10 Bridges x10 Feet on ball bridge, obl and HS curl PROM BIL LE and trunk- tightness RT> left.  HS, piriformis, Double and single K2C   STM to R ITB  01/14/24 NuStep L5 x 6 min Goals  Lumbar ORM 5x S2S 19.54 LE did push against table on first rep  Gait three laps ~ 336ft some fatigue and instability at times  HS curls 20lb 2x10 Leg Ext 10lb 2x10 Feet on ball bridge, obl and HS curl PROM BIL LE and trunk- tightness RT> left. RT IT tenderness  HS, piriformis, Double and single [facility]  01/12/24 Nustep L5  6 min 20lb resisted side steps x5 Hip add ball squeeze 2x10 Hip abd green 2x10 Sit to stand 2x10 Feet on ball bridge, obl and HS curl PROM BIL LE and trunk- tightness RT> left. RT IT tenderness  HS, piriformis, Double and single [facility]   01/07/24 Nustep L 4 6 min Standing red tband shld ext and row 2 sets 10 Standing red tband hip ext and abd 10 xeach Seated trunk ext 2 sets 10 green tband STS 2 sets 5 with wt ball chest press Seated clams and hip flex 2 sets 10 Seated ADD with ball squeeze PROM BIL LE and trunk- tightness RT> left. RT IT tenderness Feet on ball bridge, obl and HS curl Isometric abdominals   12/31/23 Nustep L 3 Feet on ball bridge, KTC and obl 10 x each Isometric abdominals 10 x hold 3  sec ADD ball squeeze 10 x then 10 x with bridge Red tband clams 2 sets 10  Red tband marching 2 sets 10 PROM BIL LE And trunk- very tight STS 5x with UE and the elevated mat without UE 5 x Seated row and shld ext 2 sets 10 Seated trunk ext 2 sets 10 green tband     12/24/23- EVAL, HEP                                                                                                                                 PATIENT EDUCATION:  Education details: POC, HEP, importance of movement Person educated: Patient and Child(ren) Education method: Medical illustrator Education comprehension: verbalized understanding  HOME EXERCISE PROGRAM: Access Code: [email or web address] URL: [email or web address] Date: 01/12/2024 Prepared by: Tanda Sorrow  Exercises - Supine Lower Trunk Rotation  - 1 x daily - 7 x weekly - 2 sets - 10 reps - Seated March  - 1 x daily - 7 x weekly - 2 sets - 10 reps - Seated Hip Abduction with Resistance  - 1 x daily - 7 x weekly - 2 sets - 10 reps - Sit to Stand  - 1 x daily - 7 x weekly - 2 sets - 5 reps - Supine Bridge  - 1 x daily - 7 x weekly - 3 sets - 10 reps - Standing Hip Abduction with Counter Support  - 1 x daily - 7 x weekly - 3 sets - 10 reps  ASSESSMENT:  CLINICAL IMPRESSION:   patient reports some ongoing pain but resorts that's it has improved. She has missed therapy due to conflicting MD appointment and attending dialysis.  Slight improvement with lumbar ROM. LE does fatigue quick with curls and extensions. She states the stretching is helpful, and responded positively today. Pt will continue to benefit from skilled therapy to address pain and weakness and posture.   Patient is a 78 y.o. female who was seen today for physical therapy evaluation and treatment for balance, gait, low back and hip pain. She is ambulating without a device but is very unsteady and shuffles her feet. She was advised to use the [facility] at the least. Patient has a  chronic cough that is pretty bad. Her daughter reports she is not complaint with HEP and  spends most of the day sitting on the sofa. She presents with LE weakness and balance deficits that put her at a high fall risk. She states that her back and hip pain prevent her from doing anything. Patient is very wobbly when going from sit to stand especially after prolonged sitting. She will benefit from skilled PT to address her gait and balance impairments and decrease her hip and back pain to be more mobile.   OBJECTIVE IMPAIRMENTS: Abnormal gait, cardiopulmonary status limiting activity, decreased activity tolerance, decreased balance, decreased coordination, decreased endurance, decreased mobility, difficulty walking, decreased ROM, decreased strength, postural dysfunction, and pain.   ACTIVITY LIMITATIONS: lifting, bending, sitting, standing, squatting, transfers, and locomotion level  PARTICIPATION LIMITATIONS: meal prep, cleaning, laundry, shopping, and community activity  PERSONAL FACTORS: Age, Behavior pattern, Past/current experiences, Time since onset of injury/illness/exacerbation, and 3+ comorbidities: CHF, ESRD on dialysis, neuropathy, chronic cough are also affecting patient's functional outcome.   REHAB POTENTIAL: Fair    CLINICAL DECISION MAKING: Stable/uncomplicated  EVALUATION COMPLEXITY: Low   GOALS: Goals reviewed with patient? Yes  SHORT TERM GOALS: Target date: 02/04/24  Patient will be independent with initial HEP. Baseline: given 12/24/23 Goal status: 01/07/24 MET  2.  Patient will demonstrate decreased fall risk by scoring < 15 sec on TUG. Baseline: 24s Goal status: 01/07/24 MET   LONG TERM GOALS: Target date: 03/17/24  Patient will be independent with advanced/ongoing HEP to improve outcomes and carryover.  Baseline:  Goal status: INITIAL  2.  Patient will be able to ambulate 500' with good safety to access community.  Baseline: not using AD, very unsteady and  unable to walk or stand more than 2 mins Goal status: Progressing slow gait 03/08/24  3.  Patient will report 50-75% improvement in R hip and low back pain Baseline: 10/10  Goal status: Progressing 7/10   4.  Patient will demonstrate improved functional LE strength as demonstrated by 5xSTS <20s Baseline: 43s elevated mat and UE push off Goal status: Progressing 01/14/24, Progressing 01/21/24  5.  Patient will increase standing and walking tolerance to 10 mins  Baseline: 2-5 mins  Goal status: Ongoing 3 min 03/08/24   PLAN:  PT FREQUENCY: 2x/week  PT DURATION: 12 weeks  PLANNED INTERVENTIONS: 97110-Therapeutic exercises, 97530- Therapeutic activity, 97112- Neuromuscular re-education, 97535- Self Care, [id number]- Manual therapy, [phone]- Gait training, [phone]- Electrical stimulation (unattended), Patient/Family education, Balance training, Stair training, Dry Needling, Joint mobilization, Spinal mobilization, Cryotherapy, and Moist heat.  PLAN FOR NEXT SESSION: LE strengthening, balance training, low back and hip stretches . INCREASE HEP Pt daughter pt has not been compliant with HEP   Tanda KANDICE Sorrow, PTA 03/08/2024, 11:51 AM

## 2024-03-08 NOTE — Telephone Encounter (Signed)
 The patient's answering machine is full. Will try again later. Paula [name] CCT

## 2024-03-09 DIAGNOSIS — N186 End stage renal disease: Secondary | ICD-10-CM | POA: Diagnosis not present

## 2024-03-09 DIAGNOSIS — Z992 Dependence on renal dialysis: Secondary | ICD-10-CM | POA: Diagnosis not present

## 2024-03-09 DIAGNOSIS — N2581 Secondary hyperparathyroidism of renal origin: Secondary | ICD-10-CM | POA: Diagnosis not present

## 2024-03-10 DIAGNOSIS — I129 Hypertensive chronic kidney disease with stage 1 through stage 4 chronic kidney disease, or unspecified chronic kidney disease: Secondary | ICD-10-CM | POA: Diagnosis not present

## 2024-03-10 DIAGNOSIS — N186 End stage renal disease: Secondary | ICD-10-CM | POA: Diagnosis not present

## 2024-03-10 DIAGNOSIS — Z992 Dependence on renal dialysis: Secondary | ICD-10-CM | POA: Diagnosis not present

## 2024-03-11 ENCOUNTER — Encounter (HOSPITAL_COMMUNITY): Payer: Self-pay

## 2024-03-11 ENCOUNTER — Other Ambulatory Visit: Payer: Self-pay

## 2024-03-11 ENCOUNTER — Other Ambulatory Visit: Payer: Self-pay | Admitting: Cardiology

## 2024-03-11 DIAGNOSIS — I517 Cardiomegaly: Secondary | ICD-10-CM

## 2024-03-11 DIAGNOSIS — R9431 Abnormal electrocardiogram [ECG] [EKG]: Secondary | ICD-10-CM

## 2024-03-11 DIAGNOSIS — Z992 Dependence on renal dialysis: Secondary | ICD-10-CM | POA: Diagnosis not present

## 2024-03-11 DIAGNOSIS — N2581 Secondary hyperparathyroidism of renal origin: Secondary | ICD-10-CM | POA: Diagnosis not present

## 2024-03-11 DIAGNOSIS — N186 End stage renal disease: Secondary | ICD-10-CM | POA: Diagnosis not present

## 2024-03-14 DIAGNOSIS — N2581 Secondary hyperparathyroidism of renal origin: Secondary | ICD-10-CM | POA: Diagnosis not present

## 2024-03-14 DIAGNOSIS — Z992 Dependence on renal dialysis: Secondary | ICD-10-CM | POA: Diagnosis not present

## 2024-03-14 DIAGNOSIS — N186 End stage renal disease: Secondary | ICD-10-CM | POA: Diagnosis not present

## 2024-03-15 ENCOUNTER — Ambulatory Visit (HOSPITAL_COMMUNITY)
Admission: RE | Admit: 2024-03-15 | Discharge: 2024-03-15 | Disposition: A | Discharge status: Home / Self Care | Source: Ambulatory Visit | Attending: Cardiology | Admitting: Cardiology

## 2024-03-15 DIAGNOSIS — R9431 Abnormal electrocardiogram [ECG] [EKG]: Secondary | ICD-10-CM | POA: Diagnosis not present

## 2024-03-15 DIAGNOSIS — I517 Cardiomegaly: Secondary | ICD-10-CM

## 2024-03-15 MED ORDER — TECHNETIUM TC 99M PYROPHOSPHATE
20.5000 | Freq: Once | INTRAVENOUS | Status: AC
  Administered 2024-03-15: 20.5 via INTRAVENOUS

## 2024-03-16 DIAGNOSIS — Z992 Dependence on renal dialysis: Secondary | ICD-10-CM | POA: Diagnosis not present

## 2024-03-16 DIAGNOSIS — N2581 Secondary hyperparathyroidism of renal origin: Secondary | ICD-10-CM | POA: Diagnosis not present

## 2024-03-16 DIAGNOSIS — N186 End stage renal disease: Secondary | ICD-10-CM | POA: Diagnosis not present

## 2024-03-16 LAB — MYOCARDIAL AMYLOID PLANAR & SPECT: H/CL Ratio: 1.18

## 2024-03-17 NOTE — Telephone Encounter (Signed)
-----   Message from Lynwood Schilling sent at 03/16/2024 12:56 PM EDT ----- I forgot to attach it to this.  This was a negative test for amyloid.  Please let her know.  Thanks. ----- Message ----- From: Barbaraann Darryle Ned, MD Sent: 03/16/2024  10:22 AM EDT To: Lynwood Schilling, MD

## 2024-03-17 NOTE — Telephone Encounter (Signed)
 The patient has been notified of the result and verbalized understanding.  All questions (if any) were answered. Aleck LOISE Bill, RN 03/17/2024 9:03 AM

## 2024-03-17 NOTE — Telephone Encounter (Signed)
 Unable to leave voicemail on any of pt's contacts. Voice mail is full or not set up. Will call at a later time

## 2024-03-18 DIAGNOSIS — Z992 Dependence on renal dialysis: Secondary | ICD-10-CM | POA: Diagnosis not present

## 2024-03-18 DIAGNOSIS — N2581 Secondary hyperparathyroidism of renal origin: Secondary | ICD-10-CM | POA: Diagnosis not present

## 2024-03-18 DIAGNOSIS — N186 End stage renal disease: Secondary | ICD-10-CM | POA: Diagnosis not present

## 2024-03-21 DIAGNOSIS — Z992 Dependence on renal dialysis: Secondary | ICD-10-CM | POA: Diagnosis not present

## 2024-03-21 DIAGNOSIS — N2581 Secondary hyperparathyroidism of renal origin: Secondary | ICD-10-CM | POA: Diagnosis not present

## 2024-03-21 DIAGNOSIS — N186 End stage renal disease: Secondary | ICD-10-CM | POA: Diagnosis not present

## 2024-03-22 ENCOUNTER — Encounter: Payer: Self-pay | Admitting: Physical Therapy

## 2024-03-22 ENCOUNTER — Ambulatory Visit: Attending: Family Medicine | Admitting: Physical Therapy

## 2024-03-22 DIAGNOSIS — M25551 Pain in right hip: Secondary | ICD-10-CM | POA: Insufficient documentation

## 2024-03-22 DIAGNOSIS — R2689 Other abnormalities of gait and mobility: Secondary | ICD-10-CM | POA: Diagnosis not present

## 2024-03-22 DIAGNOSIS — R262 Difficulty in walking, not elsewhere classified: Secondary | ICD-10-CM | POA: Insufficient documentation

## 2024-03-22 DIAGNOSIS — M6281 Muscle weakness (generalized): Secondary | ICD-10-CM | POA: Diagnosis not present

## 2024-03-22 DIAGNOSIS — M5459 Other low back pain: Secondary | ICD-10-CM | POA: Diagnosis not present

## 2024-03-22 NOTE — Therapy (Addendum)
 OUTPATIENT PHYSICAL THERAPY THORACOLUMBAR Progress Note Reporting Period 12/24/23 to 03/22/24  See note below for Objective Data and Assessment of Progress/Goals.      Patient Name: Paula [name]  MRN: [id number] DOB:May 30, 1946, 78 y.o., female Today's Date: 03/23/2024  END OF SESSION:  PT End of Session - 03/22/24 1356     Visit Number 10    Date for PT Re-Evaluation 04/22/24    PT Start Time 1357    PT Stop Time 1430    PT Time Calculation (min) 33 min    Activity Tolerance Patient tolerated treatment well    Behavior During Therapy [facility] for tasks assessed/performed             Past Medical History:  Diagnosis Date   Allergic rhinitis    Allergy  1/12000   Arthritis    Asthma    Brain aneurysm    Cough    Diabetes mellitus    Diastolic CHF, chronic (HCC) 10/11/2011   ESRD on hemodialysis (HCC)    MWF   GERD (gastroesophageal reflux disease)    History of colon polyps 2012   tubular adenoma    Hyperlipidemia    Hypertension    Neuropathy 10/11/2011   PONV (postoperative nausea and vomiting)    one time after lymph node surgery   Past Surgical History:  Procedure Laterality Date   AV FISTULA PLACEMENT Right 10/15/2021   Procedure: ARTERIOVENOUS (AV) FISTULA  WITH PLACEMENT OF GORE-TEX STRETCH  GRAFT (4-38mmx45cm);  Surgeon: Eliza Lonni RAMAN, MD;  Location: [facility] OR;  Service: Vascular;  Laterality: Right;   CATARACT EXTRACTION     right eye   IR 3D INDEPENDENT WKST  12/14/2017   IR ANGIO INTRA EXTRACRAN SEL COM CAROTID INNOMINATE BILAT MOD SED  09/15/2017   IR ANGIO INTRA EXTRACRAN SEL COM CAROTID INNOMINATE UNI L MOD SED  12/14/2017   IR ANGIO VERTEBRAL SEL VERTEBRAL BILAT MOD SED  09/15/2017   IR RADIOLOGIST EVAL & MGMT  09/10/2017   IR RADIOLOGIST EVAL & MGMT  10/19/2017   lymphatic mass surgery     NASAL TURBINATE REDUCTION     RADIOLOGY WITH ANESTHESIA N/A 12/14/2017   Procedure: RADIOLOGY WITH ANESTHESIA EMBOLIZATION;  Surgeon: Dolphus Carrion, MD;  Location: [facility] OR;  Service: Radiology;  Laterality: N/A;   VIDEO BRONCHOSCOPY Bilateral 11/18/2017   Procedure: VIDEO BRONCHOSCOPY WITHOUT FLUORO;  Surgeon: Shelah Lamar RAMAN, MD;  Location: [facility] ENDOSCOPY;  Service: Cardiopulmonary;  Laterality: Bilateral;   Patient Active Problem List   Diagnosis Date Noted   Hypoxemia 12/29/2023   Abnormal electrocardiogram (ECG) (EKG) 12/29/2023   Left ventricular hypertrophy 12/29/2023   CHF (congestive heart failure) (HCC) 10/12/2023   Blurry vision 10/11/2023   Anemia due to chronic kidney disease 10/11/2023   Acute respiratory failure with hypoxia (HCC) 08/17/2023   PNA (pneumonia) 08/17/2023   Volume overload 08/17/2023   Acute hypoxic respiratory failure (HCC) 08/17/2023   SNHL (sensory-neural hearing loss), 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   ESRD on dialysis (HCC) 11/20/2022   Mold exposure 08/14/2022   Opioid withdrawal (HCC) 08/14/2022   ILD (interstitial lung disease) (HCC) 08/14/2022   Secondary hyperparathyroidism, renal (HCC) 08/14/2022   Paroxysmal A-fib (HCC) 05/20/2022   Hypertrophy of nasal turbinates 09/05/2021   Chronic kidney disease, stage 5 (HCC)    Aortic atherosclerosis (HCC) 06/24/2021   Chronic rhinitis 10/01/2017   Seasonal allergic conjunctivitis 10/01/2017   Aneurysm (HCC) 09/24/2017   Acute metabolic encephalopathy 09/02/2017   Allergic rhinitis 08/20/2017   Palpitations 07/10/2016  Gout 01/23/2016   History of cataract 02/27/2015   Seasonal and perennial allergic rhinitis 02/15/2012   Chronic diastolic CHF (congestive heart failure) (HCC) 10/11/2011   GERD (gastroesophageal reflux disease)    Type 2 diabetes mellitus without complication, without long-term current use of insulin  (HCC) 07/03/2010   Hyperlipidemia 07/03/2010   Essential hypertension 07/03/2010    PCP: Corrina Sabin  REFERRING PROVIDER: Corrina Sabin  REFERRING DIAG: M54.50,G89.29 (ICD-10-CM) - Chronic right-sided low back pain without  sciaticaM25.551 (ICD-10-CM) - Right hip pain  Rationale for Evaluation and Treatment: Rehabilitation  THERAPY DIAG:  Muscle weakness (generalized)  Difficulty in walking, not elsewhere classified  Pain in right hip  Other low back pain  Balance problem  ONSET DATE: 10/11/23  SUBJECTIVE:                                                                                                                                                                                           SUBJECTIVE STATEMENT: Im tired was dancing and she fell three days ago, no injuries  PERTINENT HISTORY:  Paula [name]  is a 78 year old female with  a history of type 2 diabetes mellitus (A1c 76.9), hypertension, hypercholesterolemia, allergic rhinitis, severe upper airway cough syndrome, stage V CKD (managed by Dr [facility], currently on hemodialysis Tuesday, Thursday and Saturday), 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   She experiences pain in the lower back, specifically in the right hip and lower back, which makes standing for more than two minutes difficult. The pain is localized to the hip area without radiation down the leg. She is receiving physical therapy at home and is interested in outpatient therapy. Tylenol  and a heating pad provide pain relief. Hip x-ray from 2024 revealed degenerative changes send pubic symphysis, both hips, SI joints and lower lumbar spine.  She fell from her bed last Monday, resulting in pain on the left side of her head and right shoulder, which was swollen but has improved. No current swelling or pain with pressure. She experiences dizziness at times and uses a cane occasionally.    PAIN:  Are you having pain? Yes: NPRS scale: 7/10 low back pain Pain location: R hip and midline  low back  Pain description: not there all the time, if I am sitting I am okay Aggravating factors: when I get up, when I am standing, walking  Relieving factors: Tylenol  and gabapentin ,   PRECAUTIONS: Fall  RED FLAGS: None   WEIGHT  BEARING RESTRICTIONS: No  FALLS:  Has patient fallen in last 6 months? Yes. Number of falls 2 knee gave out in March, then 1 time fell out of the bed   LIVING ENVIRONMENT: Lives with: lives with their family Lives in: House/apartment Stairs: Yes: Internal: 14 steps; on right going up and the wall on the other side Has following equipment at home: Single point cane and Walker - 2 wheeled  OCCUPATION: N/A  PLOF: Independent with basic ADLs  PATIENT GOALS: to get rid of the pain, be able to walk and stand for longer than 2 mins without the pain  NEXT MD VISIT: 03/01/24  OBJECTIVE:  Note: Objective measures were completed at Evaluation unless otherwise noted.  DIAGNOSTIC FINDINGS:  X ray ordered for hips, lumbar, and knees   COGNITION: Overall cognitive status: Within functional limits for tasks assessed     SENSATION: WFL  POSTURE: rounded shoulders, forward head, and increased thoracic kyphosis  PALPATION: TTP and pain lumbar spine   LUMBAR ROM:   AROM eval 01/14/24 01/21/24 03/08/24 03/21/24  Flexion Distal shin with pain [facility]   WFL  Extension 25% available Limited 25% Limited 25% [facility] WFL  Right lateral flexion Mid thigh with pain Limited 25% Limited 25% 25% limited  WFL  Left lateral flexion Mid thigh with pain WFL   WFL  Right rotation 75% available with pain [facility]   WFL  Left rotation 75% available with pain WFL   WFL   (Blank rows = not tested)  LOWER EXTREMITY ROM:   grossly WFL   LOWER EXTREMITY MMT:  grossly 4-/5 BLE    FUNCTIONAL TESTS:  5 times sit to stand: 43s from elevated mat and needing UE push off, loses balance on to heels  Timed up and go (TUG): 24s   GAIT: Distance walked: in clinic distances Assistive device  utilized: None Level of assistance: CGA Comments: furniture walking, or reaches out to grab daughter's hand to walk  TREATMENT DATE:  03/22/24 Bike L 3x 5 min Goals  5x sit tot stand 22.23 sec Rows & Ext green 2x10 Passive stretching to bilateral HS, piriformis, Glute, ITB Gait 178ft ~ 52.41 seconds to complete  03/08/24 NuStep L 5 x 6 min  HS curls 20lb 2x10 Leg Ext 5lb 2x10 Rows & Ext green 2x10 Passive stretching to bilateral HS, piriformis, Glute, ITB  02/16/24 NuStep Leg ext HS curls  STS on airex Walking on beam  Step ups   02/11/24 NuStep L5x57mins Shoulder ext 5# 2x10 Step ups 4  Feet on ball rotations and knees to chest x10 Bridges 2x10 Passive stretching HS, glutes, SKTC, LTR STS 2x10  01/28/24 NuStep L5x57mins  STS on airex 2x10 HS curls 20lb 2x10 Leg Ext 10lb 2x10 Shoulder ext and rows green band 2x10 Passive stretching- HS, SKTC, LTR     01/21/24 NuStep L5 x 6 min Goals  Lumbar ROM 5x S2S 17.77 LE did push against table on first rep HS curls 20lb 2x10 Leg Ext 10lb 2x10 Shoulder Ext 5lb 2x10 Bridges x10 Feet on ball bridge, obl and HS curl PROM BIL LE and trunk- tightness RT> left.  HS, piriformis, Double and single K2C   STM to R ITB  01/14/24 NuStep L5 x 6 min Goals  Lumbar ORM 5x S2S 19.54 LE did push against table on first rep  Gait three laps ~ 350ft some fatigue and instability at times  HS curls 20lb 2x10 Leg Ext 10lb 2x10 Feet on ball bridge, obl and  HS curl PROM BIL LE and trunk- tightness RT> left. RT IT tenderness  HS, piriformis, Double and single [facility]  01/12/24 Nustep L5  6 min 20lb resisted side steps x5 Hip add ball squeeze 2x10 Hip abd green 2x10 Sit to stand 2x10 Feet on ball bridge, obl and HS curl PROM BIL LE and trunk- tightness RT> left. RT IT tenderness  HS, piriformis, Double and single [facility]   01/07/24 Nustep L 4 6 min Standing red tband shld ext and row 2 sets 10 Standing red tband hip ext and abd 10  xeach Seated trunk ext 2 sets 10 green tband STS 2 sets 5 with wt ball chest press Seated clams and hip flex 2 sets 10 Seated ADD with ball squeeze PROM BIL LE and trunk- tightness RT> left. RT IT tenderness Feet on ball bridge, obl and HS curl Isometric abdominals   12/31/23 Nustep L 3 Feet on ball bridge, KTC and obl 10 x each Isometric abdominals 10 x hold 3 sec ADD ball squeeze 10 x then 10 x with bridge Red tband clams 2 sets 10  Red tband marching 2 sets 10 PROM BIL LE And trunk- very tight STS 5x with UE and the elevated mat without UE 5 x Seated row and shld ext 2 sets 10 Seated trunk ext 2 sets 10 green tband     12/24/23- EVAL, HEP                                                                                                                                 PATIENT EDUCATION:  Education details: POC, HEP, importance of movement Person educated: Patient and Child(ren) Education method: Medical illustrator Education comprehension: verbalized understanding  HOME EXERCISE PROGRAM: Access Code: [email or web address] URL: [email or web address] Date: 01/12/2024 Prepared by: Tanda Sorrow  Exercises - Supine Lower Trunk Rotation  - 1 x daily - 7 x weekly - 2 sets - 10 reps - Seated March  - 1 x daily - 7 x weekly - 2 sets - 10 reps - Seated Hip Abduction with Resistance  - 1 x daily - 7 x weekly - 2 sets - 10 reps - Sit to Stand  - 1 x daily - 7 x weekly - 2 sets - 5 reps - Supine Bridge  - 1 x daily - 7 x weekly - 3 sets - 10 reps - Standing Hip Abduction with Counter Support  - 1 x daily - 7 x weekly - 3 sets - 10 reps  ASSESSMENT:  CLINICAL IMPRESSION:   ~ 13 min late. Some progress has been made reporting decrease pain. She continues to  missed therapy due to conflicting MD appointment and attending dialysis.  She has full lumbar ROM, but remains functionally limited due to weakness and slow gait. Increase time needed with 5 x sit to stand and  to complete gait trial. PT repots one fall dancing three days ago.  She  states the stretching is helpful, and responded positively today. Pt will continue to benefit from skilled therapy to address pain and weakness and posture.   Patient is a 78 y.o. female who was seen today for physical therapy evaluation and treatment for balance, gait, low back and hip pain. She is ambulating without a device but is very unsteady and shuffles her feet. She was advised to use the [facility] at the least. Patient has a chronic cough that is pretty bad. Her daughter reports she is not complaint with HEP and spends most of the day sitting on the sofa. She presents with LE weakness and balance deficits that put her at a high fall risk. She states that her back and hip pain prevent her from doing anything. Patient is very wobbly when going from sit to stand especially after prolonged sitting. She will benefit from skilled PT to address her gait and balance impairments and decrease her hip and back pain to be more mobile.   OBJECTIVE IMPAIRMENTS: Abnormal gait, cardiopulmonary status limiting activity, decreased activity tolerance, decreased balance, decreased coordination, decreased endurance, decreased mobility, difficulty walking, decreased ROM, decreased strength, postural dysfunction, and pain.   ACTIVITY LIMITATIONS: lifting, bending, sitting, standing, squatting, transfers, and locomotion level  PARTICIPATION LIMITATIONS: meal prep, cleaning, laundry, shopping, and community activity  PERSONAL FACTORS: Age, Behavior pattern, Past/current experiences, Time since onset of injury/illness/exacerbation, and 3+ comorbidities: CHF, ESRD on dialysis, neuropathy, chronic cough are also affecting patient's functional outcome.   REHAB POTENTIAL: Fair    CLINICAL DECISION MAKING: Stable/uncomplicated  EVALUATION COMPLEXITY: Low   GOALS: Goals reviewed with patient? Yes  SHORT TERM GOALS: Target date: 02/04/24  Patient will  be independent with initial HEP. Baseline: given 12/24/23 Goal status: 01/07/24 MET  2.  Patient will demonstrate decreased fall risk by scoring < 15 sec on TUG. Baseline: 24s Goal status: 01/07/24 MET   LONG TERM GOALS: Target date: 03/17/24  Patient will be independent with advanced/ongoing HEP to improve outcomes and carryover.  Baseline:  Goal status: ongoing 03/22/24  2.  Patient will be able to ambulate 500' with good safety to access community.  Baseline: not using AD, very unsteady and unable to walk or stand more than 2 mins Goal status: Progressing slow gait 03/08/24, ongoing 03/22/24  3.  Patient will report 50-75% improvement in R hip and low back pain Baseline: 10/10  Goal status: Progressing 7/10, Met 03/22/24  4.  Patient will demonstrate improved functional LE strength as demonstrated by 5xSTS <20s Baseline: 43s elevated mat and UE push off Goal status: Progressing 01/14/24, Progressing 01/21/24,  ongoing 03/21/24  5.  Patient will increase standing and walking tolerance to 10 mins  Baseline: 2-5 mins  Goal status: Ongoing 3 min 03/08/24, ongoing 03/22/24   PLAN:  PT FREQUENCY: 2x/week  PT DURATION: 12 weeks  PLANNED INTERVENTIONS: 97110-Therapeutic exercises, 97530- Therapeutic activity, 97112- Neuromuscular re-education, 97535- Self Care, [id number]- Manual therapy, [phone]- Gait training, [phone]- Electrical stimulation (unattended), Patient/Family education, Balance training, Stair training, Dry Needling, Joint mobilization, Spinal mobilization, Cryotherapy, and Moist heat.  PLAN FOR NEXT SESSION: LE strengthening, balance training, low back and hip stretches . INCREASE HEP Pt daughter pt has not been compliant with HEP   OBADIAH OZELL ORN, PT 03/23/2024, 7:40 AM

## 2024-03-23 ENCOUNTER — Ambulatory Visit: Payer: Self-pay | Admitting: Cardiology

## 2024-03-23 DIAGNOSIS — Z992 Dependence on renal dialysis: Secondary | ICD-10-CM | POA: Diagnosis not present

## 2024-03-23 DIAGNOSIS — N2581 Secondary hyperparathyroidism of renal origin: Secondary | ICD-10-CM | POA: Diagnosis not present

## 2024-03-23 DIAGNOSIS — N186 End stage renal disease: Secondary | ICD-10-CM | POA: Diagnosis not present

## 2024-03-25 ENCOUNTER — Encounter: Payer: Self-pay | Admitting: Cardiology

## 2024-03-25 DIAGNOSIS — N186 End stage renal disease: Secondary | ICD-10-CM | POA: Diagnosis not present

## 2024-03-25 DIAGNOSIS — Z992 Dependence on renal dialysis: Secondary | ICD-10-CM | POA: Diagnosis not present

## 2024-03-25 DIAGNOSIS — N2581 Secondary hyperparathyroidism of renal origin: Secondary | ICD-10-CM | POA: Diagnosis not present

## 2024-03-25 NOTE — Telephone Encounter (Signed)
 Daughter returning call to a nurse for results

## 2024-03-28 DIAGNOSIS — N2581 Secondary hyperparathyroidism of renal origin: Secondary | ICD-10-CM | POA: Diagnosis not present

## 2024-03-28 DIAGNOSIS — N186 End stage renal disease: Secondary | ICD-10-CM | POA: Diagnosis not present

## 2024-03-28 DIAGNOSIS — Z992 Dependence on renal dialysis: Secondary | ICD-10-CM | POA: Diagnosis not present

## 2024-03-29 ENCOUNTER — Other Ambulatory Visit: Payer: Self-pay

## 2024-03-29 ENCOUNTER — Emergency Department (HOSPITAL_COMMUNITY)

## 2024-03-29 ENCOUNTER — Emergency Department (HOSPITAL_COMMUNITY)
Admission: EM | Admit: 2024-03-29 | Discharge: 2024-03-29 | Disposition: A | Discharge status: Home / Self Care | Attending: Emergency Medicine | Admitting: Emergency Medicine

## 2024-03-29 ENCOUNTER — Encounter (HOSPITAL_COMMUNITY): Payer: Self-pay

## 2024-03-29 DIAGNOSIS — Z91010 Allergy to peanuts: Secondary | ICD-10-CM | POA: Insufficient documentation

## 2024-03-29 DIAGNOSIS — I120 Hypertensive chronic kidney disease with stage 5 chronic kidney disease or end stage renal disease: Secondary | ICD-10-CM | POA: Insufficient documentation

## 2024-03-29 DIAGNOSIS — I5032 Chronic diastolic (congestive) heart failure: Secondary | ICD-10-CM

## 2024-03-29 DIAGNOSIS — I959 Hypotension, unspecified: Secondary | ICD-10-CM | POA: Insufficient documentation

## 2024-03-29 DIAGNOSIS — R0989 Other specified symptoms and signs involving the circulatory and respiratory systems: Secondary | ICD-10-CM | POA: Diagnosis not present

## 2024-03-29 DIAGNOSIS — Z7901 Long term (current) use of anticoagulants: Secondary | ICD-10-CM | POA: Insufficient documentation

## 2024-03-29 DIAGNOSIS — N186 End stage renal disease: Secondary | ICD-10-CM | POA: Insufficient documentation

## 2024-03-29 DIAGNOSIS — E1122 Type 2 diabetes mellitus with diabetic chronic kidney disease: Secondary | ICD-10-CM

## 2024-03-29 DIAGNOSIS — Z79899 Other long term (current) drug therapy: Secondary | ICD-10-CM | POA: Insufficient documentation

## 2024-03-29 DIAGNOSIS — Z992 Dependence on renal dialysis: Secondary | ICD-10-CM | POA: Diagnosis not present

## 2024-03-29 DIAGNOSIS — I4891 Unspecified atrial fibrillation: Secondary | ICD-10-CM | POA: Insufficient documentation

## 2024-03-29 DIAGNOSIS — R0602 Shortness of breath: Secondary | ICD-10-CM | POA: Diagnosis not present

## 2024-03-29 LAB — COMPREHENSIVE METABOLIC PANEL WITH GFR
ALT: 14 U/L (ref 0–44)
AST: 17 U/L (ref 15–41)
Albumin: 3.5 g/dL (ref 3.5–5.0)
Alkaline Phosphatase: 82 U/L (ref 38–126)
Anion gap: 14 (ref 5–15)
BUN: 61 mg/dL — ABNORMAL HIGH (ref 8–23)
CO2: 25 mmol/L (ref 22–32)
Calcium: 8.5 mg/dL — ABNORMAL LOW (ref 8.9–10.3)
Chloride: 92 mmol/L — ABNORMAL LOW (ref 98–111)
Creatinine, Ser: 7.76 mg/dL — ABNORMAL HIGH (ref 0.44–1.00)
GFR, Estimated: 5 mL/min — ABNORMAL LOW (ref >60)
Glucose, Bld: 113 mg/dL — ABNORMAL HIGH (ref 70–99)
Potassium: 4.6 mmol/L (ref 3.5–5.1)
Sodium: 131 mmol/L — ABNORMAL LOW (ref 135–145)
Total Bilirubin: 0.9 mg/dL (ref 0.0–1.2)
Total Protein: 7.1 g/dL (ref 6.5–8.1)

## 2024-03-29 LAB — CBC WITH DIFFERENTIAL/PLATELET
Abs Immature Granulocytes: 0.02 K/uL (ref 0.00–0.07)
Basophils Absolute: 0 K/uL (ref 0.0–0.1)
Basophils Relative: 0 %
Eosinophils Absolute: 0.2 K/uL (ref 0.0–0.5)
Eosinophils Relative: 4 %
HCT: 33.5 % — ABNORMAL LOW (ref 36.0–46.0)
Hemoglobin: 11.5 g/dL — ABNORMAL LOW (ref 12.0–15.0)
Immature Granulocytes: 0 %
Lymphocytes Relative: 22 %
Lymphs Abs: 1 K/uL (ref 0.7–4.0)
MCH: 32.2 pg (ref 26.0–34.0)
MCHC: 34.3 g/dL (ref 30.0–36.0)
MCV: 93.8 fL (ref 80.0–100.0)
Monocytes Absolute: 0.7 K/uL (ref 0.1–1.0)
Monocytes Relative: 14 %
Neutro Abs: 2.7 K/uL (ref 1.7–7.7)
Neutrophils Relative %: 60 %
Platelets: 142 K/uL — ABNORMAL LOW (ref 150–400)
RBC: 3.57 MIL/uL — ABNORMAL LOW (ref 3.87–5.11)
RDW: 17.1 % — ABNORMAL HIGH (ref 11.5–15.5)
WBC: 4.6 K/uL (ref 4.0–10.5)
nRBC: 0 % (ref 0.0–0.2)

## 2024-03-29 LAB — TROPONIN I (HIGH SENSITIVITY)
Troponin I (High Sensitivity): 48 ng/L — ABNORMAL HIGH (ref <18)
Troponin I (High Sensitivity): 42 ng/L — ABNORMAL HIGH (ref <18)

## 2024-03-29 LAB — LACTIC ACID, PLASMA: Lactic Acid, Venous: 1.1 mmol/L (ref 0.5–1.9)

## 2024-03-29 MED ORDER — ISOSORBIDE MONONITRATE ER 30 MG PO TB24
60.0000 mg | ORAL_TABLET | Freq: Every day | ORAL | 2 refills | Status: DC
  Filled 2024-03-29: qty 30, 15d supply, fill #0

## 2024-03-29 MED ORDER — SODIUM CHLORIDE 0.9 % IV BOLUS: 500.0000 mL | Freq: Once | INTRAVENOUS | Status: DC

## 2024-03-29 MED ORDER — ISOSORBIDE MONONITRATE ER 30 MG PO TB24: 60.0000 mg | ORAL_TABLET | Freq: Every day | ORAL | 2 refills | Status: DC

## 2024-03-29 NOTE — ED Triage Notes (Signed)
 Fell  2 weeks ago  then went to therapy.  Back pain today 6/10.

## 2024-03-29 NOTE — ED Provider Notes (Signed)
 [place] EMERGENCY DEPARTMENT AT [facility] Provider Note   CSN: [id number] Arrival date & time: 03/29/24  1454     Patient presents with: Weakness (States felt weak and had back pain.  Back pain started 2 weks ago)   Paula [name]  is a 78 y.o. female.   78 year old female with history of ESRD on IHD, atrial fibrillation on Eliquis , and hypertension who presents emergency department low blood pressure.  Patient reports that for months she has been having low blood pressure.  Was recently taken off her amlodipine  because of this.  This morning started feeling generally weak and nauseous.  Daughter checked her blood pressure and it was low.  Says that she did take her isosorbide  this morning and is also on Coreg .  Has been compliant with her dialysis and got it yesterday.  Was prescribed midodrine  but has not been taking it.        Prior to Admission medications   Medication Sig Start Date End Date Taking? Authorizing Provider  ACCU-CHEK GUIDE TEST test strip USE 1 STRIP TO CHECK GLUCOSE TWICE DAILY 12/09/23   Newlin, Enobong, MD  acetaminophen  (TYLENOL ) 500 MG tablet Take 500 mg by mouth every 6 (six) hours as needed for fever, moderate pain or mild pain.    [provider]  albuterol  (PROAIR  HFA) 108 (90 Base) MCG/ACT inhaler Inhale 1-2 puffs into the lungs every 6 (six) hours as needed for wheezing or shortness of breath. 10/29/23   Danton Jon HERO, PA-C  albuterol  (PROVENTIL ) (2.5 MG/3ML) 0.083% nebulizer solution Take 3 mLs (2.5 mg total) by nebulization every 6 (six) hours as needed for wheezing or shortness of breath. 11/04/23   Delbert Clam, MD  apixaban  (ELIQUIS ) 5 MG TABS tablet Take 1 tablet (5 mg total) by mouth 2 (two) times daily. 11/04/23   Newlin, Enobong, MD  atorvastatin  (LIPITOR ) 80 MG tablet Take 1 tablet (80 mg total) by mouth daily. 03/01/24   Newlin, Enobong, MD  azelastine  (ASTELIN ) 0.1 % nasal spray Place 2 sprays into both nostrils 2  (two) times daily. Use in each nostril as directed 01/20/24   Newlin, Enobong, MD  benzonatate  (TESSALON ) 100 MG capsule Take 1 capsule by mouth three times a day as need for cough 11/25/23     calcium  acetate (PHOSLO ) 667 MG capsule Take 1 capsule (667 mg total) by mouth 3 (three) times daily with meals. 07/17/23     carvedilol  (COREG ) 12.5 MG tablet Take 1 tablet (12.5 mg total) by mouth 2 (two) times daily with a meal. (Take once a day in the evening on hemodialysis days) 03/07/24   Delbert Clam, MD  fluticasone  (FLONASE ) 50 MCG/ACT nasal spray Place 2 sprays into both nostrils daily. 01/20/24   Newlin, Enobong, MD  fluticasone -salmeterol (ADVAIR ) 100-50 MCG/ACT AEPB Inhale 1 puff into the lungs 2 (two) times daily. 11/26/23 03/25/24  Newlin, Enobong, MD  gabapentin  (NEURONTIN ) 100 MG capsule Take 1 capsule (100 mg total) by mouth 3 (three) times daily. 11/04/23   Newlin, Enobong, MD  glipiZIDE  (GLUCOTROL  XL) 2.5 MG 24 hr tablet Take 1 tablet (2.5 mg total) by mouth daily as needed (for elevated BGL). 11/04/23   Newlin, Enobong, MD  Homeopathic Products (EARACHE DROPS OT) Place 1 drop into both ears daily as needed (for ear aches).    [provider]  isosorbide  mononitrate (IMDUR ) 30 MG 24 hr tablet Take 2 tablets (60 mg total) by mouth daily. 03/29/24   Yolande Lamar BROCKS, MD  lidocaine -prilocaine  (EMLA ) cream Apply a small amount to dialysis access one hour prior to dialysis. Wrap with plastic wrap. 12/10/21     linagliptin  (TRADJENTA ) 5 MG TABS tablet Take 1 tablet (5 mg total) by mouth daily. 08/25/23   Thapa, [place], MD  loratadine  (CLARITIN ) 10 MG tablet Take 1 tablet (10 mg total) by mouth daily. 03/07/24   Newlin, Enobong, MD  midodrine  (PROAMATINE ) 10 MG tablet Take 1 tablet (10 mg total) by mouth 3 (three) times a week. Take 30 minutes prior to HD Patient taking differently: Take 10 mg by mouth See admin instructions. Take 10 mg by mouth prior to dialysis only if blood pressure is low  07/17/23     Misc. Devices [employer]  Diagnosis - unstable gait 03/05/21   Delbert Clam, MD  Misc. Devices MISC Fistula sleeve for R arm.  Diagnoses: End-stage renal disease on hemodialysis 01/09/22   Delbert Clam, MD  Misc. Devices MISC 1. Incontinence supplies.  2. Briefs.  Diagnosis-urinary incontinence 04/17/22   Delbert Clam, MD  Misc. Devices MISC Nepro dialysis protein drink.  Diagnosis end-stage renal disease Patient taking differently: Take 10 g by mouth See admin instructions. Nepro dialysis protein drink (Diagnosis end-stage renal disease)- Mix 10 grams of powder and mix into food daily as needed for low protein 05/16/22   Delbert Clam, MD  Misc. Devices MISC Blood pressure monitor.  Diagnosis hypertension 05/20/22   Delbert Clam, MD  Misc. Devices MISC Portable oxygen concentrator, 2 L of oxygen.  Diagnosis chronic respiratory failure. 12/31/23   Newlin, Enobong, MD  montelukast  (SINGULAIR ) 10 MG tablet Take 1 tablet (10 mg total) by mouth at bedtime. 11/26/23   Newlin, Enobong, MD  olopatadine  (PATANOL) 0.1 % ophthalmic solution Place 1 drop into both eyes 2 (two) times daily. Patient taking differently: Place 1 drop into both eyes daily. 08/14/22   Brien Belvie BRAVO, MD  ondansetron  (ZOFRAN ) 4 MG tablet Take 4 mg by mouth every 8 (eight) hours as needed for nausea or vomiting.    [provider]  Throat Lozenges (HALLS COUGH DROPS MT) Use as directed 1 lozenge in the mouth or throat every 2 (two) hours as needed (for coughing).    [provider]    Allergies: Dust mite extract, Peanut-containing drug products, Ace inhibitors, Insulins, and Lovastatin    Review of Systems  Updated Vital Signs BP (!) 147/126   Pulse (!) 145   Temp 98.7 F (37.1 C) (Oral)   Resp 19   LMP  (LMP Unknown)   SpO2 93%   Physical Exam Vitals and nursing note reviewed.  Constitutional:      General: She is not in acute distress.    Appearance: She is well-developed.   HENT:     Head: Normocephalic and atraumatic.     Right Ear: External ear normal.     Left Ear: External ear normal.     Nose: Nose normal.  Eyes:     Extraocular Movements: Extraocular movements intact.     Conjunctiva/sclera: Conjunctivae normal.     Pupils: Pupils are equal, round, and reactive to light.  Cardiovascular:     Rate and Rhythm: Normal rate and regular rhythm.     Heart sounds: No murmur heard.    Comments: Fistula in right upper extremity with bruit and thrill Pulmonary:     Effort: Pulmonary effort is normal. No respiratory distress.     Breath sounds: Normal breath sounds.  Musculoskeletal:     Cervical  back: Normal range of motion and neck supple.     Right lower leg: No edema.     Left lower leg: No edema.  Skin:    General: Skin is warm and dry.  Neurological:     Mental Status: She is alert and oriented to person, place, and time. Mental status is at baseline.  Psychiatric:        Mood and Affect: Mood normal.     (all labs ordered are listed, but only abnormal results are displayed) Labs Reviewed  CBC WITH DIFFERENTIAL/PLATELET - Abnormal; Notable for the following components:      Result Value   RBC 3.57 (*)    Hemoglobin 11.5 (*)    HCT 33.5 (*)    RDW 17.1 (*)    Platelets 142 (*)    All other components within normal limits  COMPREHENSIVE METABOLIC PANEL WITH GFR - Abnormal; Notable for the following components:   Sodium 131 (*)    Chloride 92 (*)    Glucose, Bld 113 (*)    BUN 61 (*)    Creatinine, Ser 7.76 (*)    Calcium  8.5 (*)    GFR, Estimated 5 (*)    All other components within normal limits  TROPONIN I (HIGH SENSITIVITY) - Abnormal; Notable for the following components:   Troponin I (High Sensitivity) 48 (*)    All other components within normal limits  TROPONIN I (HIGH SENSITIVITY) - Abnormal; Notable for the following components:   Troponin I (High Sensitivity) 42 (*)    All other components within normal limits  LACTIC  ACID, PLASMA    EKG: EKG Interpretation Date/Time:  Tuesday March 29 2024 15:03:19 EDT Ventricular Rate:  72 PR Interval:    QRS Duration:  87 QT Interval:  385 QTC Calculation: 428 R Axis:   38  Text Interpretation: Atrial fibrillation Ventricular premature complex Low voltage, precordial leads Minimal ST elevation, inferior leads Confirmed by Yolande Charleston [phone]) on 03/29/2024 4:15:52 PM  Radiology: [facility] Chest Portable 1 View Result Date: 03/29/2024 CLINICAL DATA:  Short of breath EXAM: PORTABLE CHEST 1 VIEW COMPARISON:  10/11/2023 FINDINGS: Single frontal view of the chest demonstrates mild enlargement the cardiac silhouette. There is central pulmonary vascular congestion with patchy bibasilar consolidation. No effusion or pneumothorax. No acute bony abnormalities. IMPRESSION: 1. Findings most consistent with mild congestive heart failure. Electronically Signed   By: Ozell Daring M.D.   On: 03/29/2024 15:34     Procedures   Medications Ordered in the ED  sodium chloride  0.9 % bolus 500 mL (500 mLs Intravenous Not Given 03/29/24 1703)                                    Medical Decision Making Amount and/or Complexity of Data Reviewed Labs: ordered. Radiology: ordered.  Risk Prescription drug management.   78 year old female with history of ESRD on IHD, atrial fibrillation on Eliquis , and hypertension who presents emergency department low blood pressure.   Initial Ddx:  Medication side effect, shock, MI, hypovolemia   MDM/Course:  Patient resents emergency department with low blood pressure.  This appears to have been an ongoing issue for her.  They have been down titrating her blood pressure medicine at home.  Recently stopped her amlodipine .  Still taking her Imdur  which she had today.  Is prescribed midodrine  but is not taking it.  She is overall well-appearing.  Without any  intervention blood pressure spontaneously improved to 135/64.  Labs were obtained that did  not show any acute abnormality.  Lactic acid normal.  Not complaining of any concerning symptoms such as chest pain or shortness of breath dizziness at this point in time.  Counseled to decrease her Imdur  and follow-up with her primary doctor in several days.  Of note, chest x-ray did show very mild pulmonary edema but patient is not hypoxic or complaining of shortness of breath or grossly volume overloaded.  She does have dialysis tomorrow as well   This patient presents to the ED for concern of complaints listed in HPI, this involves an extensive number of treatment options, and is a complaint that carries with it a high risk of complications and morbidity. Disposition including potential need for admission considered.   Dispo: DC Home. Return precautions discussed including, but not limited to, those listed in the AVS. Allowed pt time to ask questions which were answered fully prior to dc.  Records reviewed Outpatient Clinic Notes The following labs were independently interpreted: Chemistry and show CKD I independently reviewed the following imaging with scope of interpretation limited to determining acute life threatening conditions related to emergency care: Chest x-ray and agree with the radiologist interpretation with the following exceptions: none I personally reviewed and interpreted cardiac monitoring: atrial fibrillation (normal rate) I personally reviewed and interpreted the pt's EKG: see above for interpretation  I have reviewed the patients home medications and made adjustments as needed Social Determinants of health:  Geriatric  Portions of this note were generated with Scientist, clinical (histocompatibility and immunogenetics). Dictation errors may occur despite best attempts at proofreading.     Final diagnoses:  Hypotension, unspecified hypotension type    ED Discharge Orders          Ordered    isosorbide  mononitrate (IMDUR ) 30 MG 24 hr tablet  Daily        03/29/24 1956                Yolande Lamar BROCKS, MD 03/29/24 2017

## 2024-03-29 NOTE — Discharge Instructions (Signed)
 You were seen for your low blood pressure in the emergency department.   At home, please stop taking your amlodipine .  Take the decreased dose of your Imdur  (30 mg).    Check your MyChart online for the results of any tests that had not resulted by the time you left the emergency department.   Follow-up with your primary doctor in 2-3 days regarding your visit.  Talk to them about your blood pressure readings.  Return immediately to the emergency department if you experience any of the following: Recurrent low blood pressures, fainting, or any other concerning symptoms.    Thank you for visiting our Emergency Department. It was a pleasure taking care of you today.

## 2024-03-30 ENCOUNTER — Other Ambulatory Visit (HOSPITAL_COMMUNITY): Payer: Self-pay

## 2024-03-30 ENCOUNTER — Telehealth: Payer: Self-pay

## 2024-03-30 ENCOUNTER — Other Ambulatory Visit: Payer: Self-pay

## 2024-03-30 DIAGNOSIS — Z992 Dependence on renal dialysis: Secondary | ICD-10-CM | POA: Diagnosis not present

## 2024-03-30 DIAGNOSIS — N2581 Secondary hyperparathyroidism of renal origin: Secondary | ICD-10-CM | POA: Diagnosis not present

## 2024-03-30 DIAGNOSIS — N186 End stage renal disease: Secondary | ICD-10-CM | POA: Diagnosis not present

## 2024-03-30 NOTE — Telephone Encounter (Signed)
 Copied from CRM #[id number]. Topic: Clinical - Prescription Issue >> Mar 30, 2024  3:01 PM Santiya F wrote: Reason for CRM: Patient is calling in because she wants to know if she can get her medication isosorbide  mononitrate (IMDUR ) 30 MG 24 hr tablet [[id number]] sent to [facility].

## 2024-03-30 NOTE — Telephone Encounter (Signed)
 Patient wanted it noted that future refills should go to [facility] pharmacy.

## 2024-03-30 NOTE — Telephone Encounter (Signed)
 Patient scheduled ov 04/12/2024

## 2024-03-30 NOTE — Telephone Encounter (Unsigned)
 Copied from CRM #[id number]. Topic: Clinical - Prescription Issue >> Mar 30, 2024  3:01 PM Paula [name] wrote: Reason for CRM: Patient is calling in because she wants to know if she can get her medication isosorbide  mononitrate (IMDUR ) 30 MG 24 hr tablet [[id number]] sent to [facility].

## 2024-04-01 DIAGNOSIS — Z992 Dependence on renal dialysis: Secondary | ICD-10-CM | POA: Diagnosis not present

## 2024-04-01 DIAGNOSIS — N186 End stage renal disease: Secondary | ICD-10-CM | POA: Diagnosis not present

## 2024-04-01 DIAGNOSIS — N2581 Secondary hyperparathyroidism of renal origin: Secondary | ICD-10-CM | POA: Diagnosis not present

## 2024-04-04 ENCOUNTER — Ambulatory Visit: Payer: Self-pay

## 2024-04-04 DIAGNOSIS — N2581 Secondary hyperparathyroidism of renal origin: Secondary | ICD-10-CM | POA: Diagnosis not present

## 2024-04-04 DIAGNOSIS — Z992 Dependence on renal dialysis: Secondary | ICD-10-CM | POA: Diagnosis not present

## 2024-04-04 DIAGNOSIS — N186 End stage renal disease: Secondary | ICD-10-CM | POA: Diagnosis not present

## 2024-04-04 NOTE — Telephone Encounter (Signed)
 FYI,  Do we need to bring patient in earlier than virtual visit scheduled for 04/12/2024

## 2024-04-04 NOTE — Telephone Encounter (Signed)
 If she has chest pain she needs to go to the ED. If she does not have chest pain I can see her for her blood pressure this Thursday. Ok to double book at 11:10am. Thanks

## 2024-04-04 NOTE — Telephone Encounter (Signed)
 FYI Only or Action Required?: Action required by provider: refusing ED, needs call back, alerted [facility] of ED refusal.  Patient was last seen in primary care on 03/01/2024 by Newlin, Enobong, MD.  Called Nurse Triage reporting Hospitalization Follow-up, Chest Pain, Headache, recent med changes, and Hypertension.  Symptoms began several days ago.  Interventions attempted: Nothing.  Symptoms are: rapidly worsening.  Triage Disposition: Go to ED Now (Notify PCP)  Patient/caregiver understands and will follow disposition?: No, refuses disposition      Copied from CRM [id number]. Topic: Clinical - Red Word Triage >> Apr 04, 2024  8:24 AM Delon HERO wrote: Red Word that prompted transfer to Nurse Triage: Patient daughter is calling to report that the patient was in the ED on Tuesday. Reporting chest tightness on Friday.  PAS reporting that no symptoms were reported for today.   Reason for Disposition  [1] Chest pain (or angina) comes and goes AND [2] is happening more often (increasing in frequency) or getting worse (increasing in severity)  (Exception: Chest pains that last only a few seconds.)  Answer Assessment - Initial Assessment Questions 1. LOCATION: Where does it hurt?       Under the breast on left side, deeper than the breast 2. RADIATION: Does the pain go anywhere else? (e.g., into neck, jaw, arms, back)     denies 3. ONSET: When did the chest pain begin? (Minutes, hours or days)      Friday 4. PATTERN: Does the pain come and go, or has it been constant since it started?  Does it get worse with exertion?      Comes and goes 5. DURATION: How long does it last (e.g., seconds, minutes, hours)     Couple seconds 6. SEVERITY: How bad is the pain?  (e.g., Scale 1-10; mild, moderate, or severe)     7/10 7. CARDIAC RISK FACTORS: Do you have any history of heart problems or risk factors for heart disease? (e.g., angina, prior heart attack; diabetes, high blood  pressure, high cholesterol, smoker, or strong family history of heart disease)     significant 8. PULMONARY RISK FACTORS: Do you have any history of lung disease?  (e.g., blood clots in lung, asthma, emphysema, birth control pills)     significant 10. OTHER SYMPTOMS: Do you have any other symptoms? (e.g., dizziness, nausea, vomiting, sweating, fever, difficulty breathing, cough)       Headache 7.5/10 BP 140-160 systolic No SOB, dizziness, or feeling out of it, denies other symptoms   Chest tightness before speaking with cardio about that but wasn't same thing having now On oxygen as well Went to ED Tuesday because BP was very low, diastolic was staying in the 50s Been having heart squeeze since Friday, after ED and after been taken off of carvedilol  Ma'am you gotta speak up, on speaker, though nurse projecting loudly through phone, ma'am speak up I'm telling you Been trying to get her to ED since Sunday   Advised ED today, pt refusing, confirmed they can call cardiologist and call dialysis for further recommendations as well, sending message to PCP for call back with further recommendations, alerted [facility] to ED refusal.  Protocols used: Chest Pain-A-AH

## 2024-04-05 ENCOUNTER — Other Ambulatory Visit: Payer: Self-pay | Admitting: Family Medicine

## 2024-04-05 ENCOUNTER — Ambulatory Visit: Attending: Student | Admitting: Physician Assistant

## 2024-04-05 ENCOUNTER — Other Ambulatory Visit: Payer: Self-pay

## 2024-04-05 ENCOUNTER — Encounter: Payer: Self-pay | Admitting: Physician Assistant

## 2024-04-05 ENCOUNTER — Other Ambulatory Visit (HOSPITAL_COMMUNITY): Payer: Self-pay

## 2024-04-05 ENCOUNTER — Telehealth: Payer: Self-pay | Admitting: Student

## 2024-04-05 VITALS — BP 116/68 | HR 71 | Ht 62.0 in | Wt 166.0 lb

## 2024-04-05 DIAGNOSIS — I480 Paroxysmal atrial fibrillation: Secondary | ICD-10-CM | POA: Diagnosis not present

## 2024-04-05 DIAGNOSIS — I5032 Chronic diastolic (congestive) heart failure: Secondary | ICD-10-CM

## 2024-04-05 DIAGNOSIS — I10 Essential (primary) hypertension: Secondary | ICD-10-CM

## 2024-04-05 DIAGNOSIS — E118 Type 2 diabetes mellitus with unspecified complications: Secondary | ICD-10-CM

## 2024-04-05 DIAGNOSIS — I959 Hypotension, unspecified: Secondary | ICD-10-CM | POA: Diagnosis not present

## 2024-04-05 DIAGNOSIS — I2510 Atherosclerotic heart disease of native coronary artery without angina pectoris: Secondary | ICD-10-CM | POA: Diagnosis not present

## 2024-04-05 DIAGNOSIS — I729 Aneurysm of unspecified site: Secondary | ICD-10-CM

## 2024-04-05 DIAGNOSIS — R0789 Other chest pain: Secondary | ICD-10-CM | POA: Diagnosis not present

## 2024-04-05 DIAGNOSIS — N186 End stage renal disease: Secondary | ICD-10-CM

## 2024-04-05 DIAGNOSIS — I129 Hypertensive chronic kidney disease with stage 1 through stage 4 chronic kidney disease, or unspecified chronic kidney disease: Secondary | ICD-10-CM

## 2024-04-05 MED ORDER — CARVEDILOL 3.125 MG PO TABS
3.1250 mg | ORAL_TABLET | Freq: Two times a day (BID) | ORAL | 3 refills | Status: AC
Start: 2024-04-05 — End: 2024-10-02
  Filled 2024-04-05 (×2): qty 180, 90d supply, fill #0
  Filled 2024-06-02 – 2024-06-29 (×3): qty 180, 90d supply, fill #1
  Filled 2024-08-05: qty 180, 90d supply, fill #2

## 2024-04-05 NOTE — Telephone Encounter (Signed)
 [facility] is faxing over BP readings for pt's appt this morning. Please advise.

## 2024-04-05 NOTE — Telephone Encounter (Signed)
 Returned patient's phone call to notify her that we did receive the Fax from [facility]. From [employer], 41 pages printed and given to Callie Goodrich, PA (and [place], [place]). No other concerns.

## 2024-04-05 NOTE — Telephone Encounter (Signed)
 Patient has been scheduled

## 2024-04-05 NOTE — Progress Notes (Unsigned)
 Cardiology Office Note:  .   Date:  04/05/2024  ID:  Paula LABOR [name] , DOB 1945/12/01, MRN [id number] PCP: Delbert Clam, MD  [facility] Providers Cardiologist:  Lynwood Schilling, MD {  History of Present Illness: .   Paula [name]  is a 78 y.o. 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   Last seen in heartcare 12/29/2023  with Dr. Schilling for follow up.  At that time, doing well from cardiac standpoint.  Reported fatigue with follow-up TSH WNL.  Noted that patient desats walking around but has home O2.  Patient is not able to carry canister with her but recommended concentrator and following up with pulmonary.  No med changes.  Recent ED visit 03/29/2024 for concern for low BP.  Reported that she has been titrating her BP medications at home.  Noted that she was recently taken off her amlodipine  because of this.  Noted compliance with isosorbide , Coreg  and dialysis.  Noted previously prescribed midodrine  but has not taken it.  Without any intervention BP spontaneously improved to 135/64.  Also CXR did show very mild pulmonary edema but patient was improved without hypoxia, SOB, or volume overload. Recommended to decrease her Imdur  and follow-up with PCP. Per medication changes list, discontinued amlodipine  2.5 mg daily, Coreg  12.5 mg twice daily and decreased Imdur  from 60 mg to 30 mg daily.  Today, reports low BP on nondialysis days with BP log showing SBP in 90s to  100s.  She was taking BP medications in the evening after dialysis then BP medications again in the morning on nondialysis days.  Reports 3 intermittent episodes of chest pain after being discontinued from amlodipine /Coreg  and Imdur  decreased.  CP described as sharp, lasting 5 seconds and occurring at rest.  No CP associated with exertion. Also notes ongoing awareness of increase in heart rate with movement daily, this was present before med changes and has not changed in intensity or worsened since.  Denies any N/V, diaphoresis, dizziness, syncope, edema and SOB.  She wears 2L of O2 at home. Reports compliance with medications.  Denies tobacco use/alcohol /drug use.   ROS: 10 point review of system has been reviewed and considered negative except ones been listed in the HPI.   Studies Reviewed: [facility]   Exercise Myoview  07/2021   The patient reported dyspnea and nausea during the stress test. Patient also reported vomitng, hypotension and stomach pains.   ECG is normal. ECG rhythm shows normal sinus rhythm. Resting ECG shows no ST-segment deviation.   There were no arrhythmias during stress. There were no arrhythmias during recovery. ECG was interpretable and without significant changes. The ECG was not diagnostic due to pharmacologic protocol.   Diaphragmatic attenuation artifact was present. Image quality affected due to significant extracardiac activity.   LV perfusion is normal. There is no evidence of ischemia. There is no evidence of infarction.   No evidence of transient ischemic dilation (TID) noted.   Prior study available for comparison from 05/17/2008.   The study is normal. The study is  low risk.    ECHO 08/2023 IMPRESSIONS   1. Left ventricular ejection fraction, by estimation, is 60 to 65%. Left  ventricular ejection fraction by 2D MOD biplane is 62.1 %. The left  ventricle has normal function. The left ventricle has no regional wall  motion abnormalities. There is severe  concentric  left ventricular hypertrophy. Left ventricular diastolic  function could not be evaluated.   2. Right ventricular systolic function is low normal. The right  ventricular size is normal. Tricuspid regurgitation signal is inadequate  for assessing PA pressure.   3. Left atrial size was moderately dilated.   4. The mitral valve is abnormal. Trivial mitral valve regurgitation.   5. The aortic valve is tricuspid. Aortic valve regurgitation is trivial.  Aortic valve sclerosis/calcification is present, without any evidence of  aortic stenosis.   6. The inferior vena cava is normal in size with greater than 50%  respiratory variability, suggesting right atrial pressure of 3 mmHg.   Comparison(s): No prior Echocardiogram.  Risk Assessment/Calculations:    CHA2DS2-VASc Score = 5   This indicates a 7.2% annual risk of stroke. The patient's score is based upon: CHF History: 0 HTN History: 1 Diabetes History: 1 Stroke History: 0 Vascular Disease History: 0 Age Score: 2 Gender Score: 1   Physical Exam:   VS:  BP 116/68   Pulse 71   Ht 5' 2 (1.575 m)   Wt 166 lb (75.3 kg)   LMP  (LMP Unknown)   SpO2 93%   BMI 30.36 kg/m    Wt Readings from Last 3 Encounters:  04/05/24 166 lb (75.3 kg)  03/01/24 166 lb 12.8 oz (75.7 kg)  02/23/24 165 lb (74.8 kg)    GEN: Well nourished, well developed in no acute distress while sitting in chair.  NECK: No JVD; No carotid bruits CARDIAC: RRR, no murmurs, rubs, gallops RESPIRATORY:  Clear to auscultation without rales, wheezing or rhonchi  ABDOMEN: Soft, non-tender, non-distended EXTREMITIES:  No edema; No deformity   ASSESSMENT AND PLAN: .   Hypotension Essential hypertension Reports low BP on nondialysis days with BP log showing SBP in 90s to 100s.  She was taking BP medications in the evening after dialysis then BP medications again in the morning on nondialysis days.  Advised that this window between BP medications was too close and most likely  contributing to low BPs on nondialysis days. Reviewed dialysis BP documentation which showed mainly normal blood pressures with occasionally mildly elevated BPs BP this OV well controlled today: 116/68 Order Coreg  3.125 mg twice daily.  Advised to take Coreg  for now to take on dialysis and nondialysis days.  Discussed if evidence of low BPs or symptomatic to discontinue morning dose of Coreg  on dialysis days. Continue Imdur  30 mg daily.  BP log X 2 weeks.  If BP remains elevated then can either increase Coreg  or add amlodipine  2.5 mg. Encourage heart healthy low sodium diet. Discussed limiting sodium intake to < 2 grams daily.     Coronary artery calcification Atypical CP  Coronary artery calcifications noted on prior chest CTs.  Myoview  in 07/2021 was negative for ischemia. Reports 3 intermittent episodes of chest pain after being discontinued from amlodipine /Coreg  and Imdur  decreased.  CP described as sharp, lasting 5 seconds and occurring at rest.  No CP associated with exertion.  Nonreproducible on exam. Denies any angina. No need for further ischemic evaluations.  Order Coreg  3.125 mg twice daily.  Continue on Lipitor  80 mg daily, Imdur   30 mg daily.  Chronic heart failure with preserved ejection fraction (HCC) ECHO 08/2023: EF 60 to 65%, severe LVH, moderately dilated LA, trivial MVR, trivial AV regurgitation, AV sclerosis/calcification without AS Denies SOB, edema, orthopnea, or PND. Appears Euvolemic on exam.  Order Coreg  3.125 mg twice daily.  Can consider adding SGLT2 inhibitor at next OV. Encouraged low sodium diet, fluid restriction <2L, and daily weights.  Educated to contact our office for weight gain of 2 lbs overnight or 5 lbs in one week. ED precautions discussed.    Paroxysmal A-fib (HCC) Reports ongoing awareness of increase in heart rate with movement daily, this was present before med changes and has not changed in intensity or worsened since. On exam noted regular, rate  and rhythm.  Denies active bleeding.  On AC with CBC WNL on 03/2024.  Continue on Eliquis  5 mg twice daily (dose appropriate for age 3, weight 75 kg, Cr 7.76  in 03/2024)  Reviewed EKG on 04/01/2024 after OV: coarse afib vs aflutter, HR 72. Attempted to call patient but no answer and not able to leave voicemail. Sent my chart message to patient: We reviewed your EKG from 04/01/24 in emergency room, which showed atrial fibrillation/flutter with a heart rate of 72. This does not change your current treatment plan since your heart rate is controlled and you are on the right medications. You have already restarted Coreg  (carvedilol ) 3.125 mg twice daily and are taking Eliquis  (apixaban ) 5 mg twice daily. We will repeat your EKG at your next visit. If you are still in atrial fibrillation/flutter, we may discuss a procedure called cardioversion to help restore normal rhythm. It is very important to take Eliquis  every day as prescribed to reduce stroke risk and to be eligible for cardioversion if needed.  Type 2 diabetes mellitus with complication, without long-term current use of insulin  (HCC) A1c 7.4 on 08/2023. Managed by PCP  ESRD (end stage renal disease) (HCC) on hemodialysis M/W/F Continue to follow up  with nephrology  Aneurysm [facility]) She has a history of a left posterior cavernous carotid aneurysm and left supraclinoid ICA aneurysm.  She has been followed by Dr. Dolphus.     Dispo: Follow-up in 4 to 6 weeks with any APP.   Signed, Lorette CINDERELLA Kapur, PA-C

## 2024-04-05 NOTE — Patient Instructions (Addendum)
 Medication Instructions:  Start Coreg  3.125mg . Take this tablet twice daily.  You may take the Coreg  3.125mg  on the mornings of dialysis. If you're not feeling well after taking the morning dose, it is ok to hold off until the evening dose.  Continue Imdur  30mg . Take this medication at night only!!!  Monitor your blood pressure daily. Upload a copy of your blood pressure log to your mychart.   *If you need a refill on your cardiac medications before your next appointment, please call your pharmacy*   Lab Work: No labs were ordered during today's visit.  If you have labs (blood work) drawn today and your tests are completely normal, you will receive your results only by: MyChart Message (if you have MyChart) OR A paper copy in the mail If you have any lab test that is abnormal or we need to change your treatment, we will call you to review the results.   Testing/Procedures: No procedures were ordered during today's visit.    Follow-Up: At [facility], you and your health needs are our priority.  As part of our continuing mission to provide you with exceptional heart care, we have created designated Provider Care Teams.  These Care Teams include your primary Cardiologist (physician) and Advanced Practice Providers (APPs -  Physician Assistants and Nurse Practitioners) who all work together to provide you with the care you need, when you need it.  We recommend signing up for the patient portal called MyChart.  Sign up information is provided on this After Visit Summary.  MyChart is used to connect with patients for Virtual Visits (Telemedicine).  Patients are able to view lab/test results, encounter notes, upcoming appointments, etc.  Non-urgent messages can be sent to your provider as well.   To learn more about what you can do with MyChart, go to [email or web address].    Your next appointment:   4-6 week(s)  Provider:   Any available APP          Other  Instructions Thank you for choosing [facility]!

## 2024-04-06 ENCOUNTER — Other Ambulatory Visit (HOSPITAL_COMMUNITY): Payer: Self-pay

## 2024-04-06 ENCOUNTER — Encounter: Payer: Self-pay | Admitting: Physician Assistant

## 2024-04-06 DIAGNOSIS — N186 End stage renal disease: Secondary | ICD-10-CM | POA: Diagnosis not present

## 2024-04-06 DIAGNOSIS — N2581 Secondary hyperparathyroidism of renal origin: Secondary | ICD-10-CM | POA: Diagnosis not present

## 2024-04-06 DIAGNOSIS — Z992 Dependence on renal dialysis: Secondary | ICD-10-CM | POA: Diagnosis not present

## 2024-04-07 ENCOUNTER — Other Ambulatory Visit (HOSPITAL_COMMUNITY): Payer: Self-pay

## 2024-04-07 ENCOUNTER — Other Ambulatory Visit: Payer: Self-pay

## 2024-04-07 ENCOUNTER — Ambulatory Visit: Attending: Family Medicine | Admitting: Family Medicine

## 2024-04-07 ENCOUNTER — Encounter: Payer: Self-pay | Admitting: Family Medicine

## 2024-04-07 VITALS — BP 130/64 | HR 79 | Ht 62.0 in | Wt 166.6 lb

## 2024-04-07 DIAGNOSIS — M62838 Other muscle spasm: Secondary | ICD-10-CM | POA: Diagnosis not present

## 2024-04-07 DIAGNOSIS — N186 End stage renal disease: Secondary | ICD-10-CM

## 2024-04-07 DIAGNOSIS — E1122 Type 2 diabetes mellitus with diabetic chronic kidney disease: Secondary | ICD-10-CM

## 2024-04-07 DIAGNOSIS — Z992 Dependence on renal dialysis: Secondary | ICD-10-CM

## 2024-04-07 DIAGNOSIS — I120 Hypertensive chronic kidney disease with stage 5 chronic kidney disease or end stage renal disease: Secondary | ICD-10-CM | POA: Diagnosis not present

## 2024-04-07 MED ORDER — ISOSORBIDE MONONITRATE ER 30 MG PO TB24
30.0000 mg | ORAL_TABLET | Freq: Every day | ORAL | 0 refills | Status: DC
Start: 2024-04-07 — End: 2024-06-29
  Filled 2024-04-07 – 2024-04-12 (×2): qty 90, 90d supply, fill #0

## 2024-04-07 MED ORDER — TIZANIDINE HCL 4 MG PO TABS
4.0000 mg | ORAL_TABLET | Freq: Three times a day (TID) | ORAL | 1 refills | Status: DC | PRN
  Filled 2024-04-07: qty 60, 20d supply, fill #0

## 2024-04-07 NOTE — Progress Notes (Signed)
 Subjective:  Patient ID: Paula [name] , female    DOB: 08-Jun-1946  Age: 78 y.o. MRN: [id number]  CC: Hospitalization Follow-up     Discussed the use of AI scribe software for clinical note transcription with the patient, who gave verbal consent to proceed.  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 She is accompanied by her granddaughter, who is a Engineer, civil (consulting).  She experiences fluctuating blood pressure readings, with recent episodes of hypotension following dialysis. Home blood pressure readings range from 150-170 mmHg systolic, dropping to 92 mmHg systolic at the end of dialysis for instance yesterday her blood pressure was in the 90s systolic. Further drops occur at home, causing malaise.  She is prescribed midodrine  10 mg to be taken three times weekly before dialysis if blood pressure is low, but she did not take it yesterday due to high pre-dialysis blood pressure. She is on carvedilol  3.125 mg twice daily, restarted at a low dose at her cardiology visit after previously being on 12.5 mg. She took her evening dose of carvedilol  after dialysis when blood pressure improved. She also takes isosorbide  60 mg in the evening, increased from 30 mg, due to the fact that she previously had chest pain. Her granddaughter monitors her blood pressure at home and shares the readings.  Abdominal pain occurs when blood pressure drops, relieved by a hot compress and rice water. Muscle spasms occur  post-dialysis, previously managed with tizanidine .      Past Medical History:  Diagnosis Date   Allergic rhinitis    Allergy  1/12000   Arthritis    Asthma    Brain aneurysm    Cough    Diabetes mellitus    Diastolic CHF, chronic (HCC) 10/11/2011   ESRD on hemodialysis (HCC)    MWF   GERD (gastroesophageal reflux disease)    History of colon polyps 2012   tubular adenoma    Hyperlipidemia    Hypertension    Neuropathy 10/11/2011   PONV (postoperative nausea and vomiting)    one time after lymph node surgery    Past Surgical History:  Procedure Laterality Date   AV FISTULA PLACEMENT Right 10/15/2021   Procedure: ARTERIOVENOUS (AV) FISTULA  WITH PLACEMENT OF GORE-TEX STRETCH  GRAFT (4-53mmx45cm);  Surgeon: Eliza Lonni RAMAN, MD;  Location: [facility] OR;  Service: Vascular;  Laterality: Right;   CATARACT EXTRACTION     right eye   IR 3D INDEPENDENT WKST  12/14/2017   IR ANGIO INTRA EXTRACRAN SEL COM CAROTID INNOMINATE BILAT MOD SED  09/15/2017   IR ANGIO INTRA EXTRACRAN SEL COM CAROTID INNOMINATE UNI L MOD SED  12/14/2017   IR ANGIO VERTEBRAL SEL VERTEBRAL BILAT MOD SED  09/15/2017   IR RADIOLOGIST EVAL & MGMT  09/10/2017   IR RADIOLOGIST EVAL & MGMT  10/19/2017   lymphatic mass surgery     NASAL TURBINATE REDUCTION     RADIOLOGY WITH ANESTHESIA N/A 12/14/2017   Procedure: RADIOLOGY WITH ANESTHESIA EMBOLIZATION;  Surgeon: Dolphus Carrion, MD;  Location: [facility] OR;  Service: Radiology;  Laterality: N/A;   VIDEO BRONCHOSCOPY Bilateral 11/18/2017  Procedure: VIDEO BRONCHOSCOPY WITHOUT FLUORO;  Surgeon: Shelah Lamar RAMAN, MD;  Location: [facility];  Service: Cardiopulmonary;  Laterality: Bilateral;    Family History  Problem Relation Age of Onset   Hypertension Mother    Asthma Sister    Colon cancer Neg Hx    Allergic rhinitis Neg Hx    Eczema Neg Hx     Social History   Socioeconomic History   Marital status: Divorced    Spouse name: Not on file   Number of  children: 5   Years of education: Not on file   Highest education level: Master's degree (e.g., MA, MS, MEng, MEd, MSW, MBA)  Occupational History   Occupation: Unemployed  Tobacco Use   Smoking status: Never    Passive exposure: Never   Smokeless tobacco: Never  Vaping Use   Vaping status: Never Used  Substance and Sexual Activity   Alcohol  use: No   Drug use: No   Sexual activity: Not Currently    Birth control/protection: Post-menopausal, None  Other Topics Concern   Not on file  Social History Narrative   Lives alone.  5 children with two deceased.     Social Drivers of Health   Financial Resource Strain: High Risk (03/01/2024)   Overall Financial Resource Strain (CARDIA)    Difficulty of Paying Living Expenses: Hard  Food Insecurity: Food Insecurity Present (03/01/2024)   Hunger Vital Sign    Worried About Running Out of Food in the Last Year: Often true    Ran Out of Food in the Last Year: Often true  Transportation Needs: Unmet Transportation Needs (03/01/2024)   PRAPARE - Administrator, Civil Service (Medical): Yes    Lack of Transportation (Non-Medical): Yes  Physical Activity: Insufficiently Active (03/01/2024)   Exercise Vital Sign    Days of Exercise per Week: 2 days    Minutes of Exercise per Session: 10 min  Stress: No Stress Concern Present (03/01/2024)   [employer] of Occupational Health - Occupational Stress Questionnaire    Feeling of Stress: Not at all  Social Connections: Unknown (03/01/2024)   Social Connection and Isolation Panel    Frequency of Communication with Friends and Family: More than three times a week    Frequency of Social Gatherings with Friends and Family: More than three times a week    Attends Religious Services: More than 4 times per year    Active Member of [employer] or Organizations: Patient declined    Attends Banker Meetings: Not on file    Marital Status: Widowed    Allergies  Allergen Reactions    Dust Mite Extract Cough   Peanut-Containing Drug Products Cough   Ace Inhibitors Cough   Insulins     Reports fainting, intolerance   Lovastatin Other (See Comments)    Generalized body pain    Outpatient Medications Prior to Visit  Medication Sig Dispense Refill   ACCU-CHEK GUIDE TEST test strip USE 1 STRIP TO CHECK GLUCOSE TWICE DAILY 100 each 0   acetaminophen  (TYLENOL ) 500 MG tablet Take 500 mg by mouth every 6 (six) hours as needed for fever, moderate pain or mild pain.     albuterol  (PROAIR  HFA) 108 (90 Base) MCG/ACT inhaler Inhale 1-2 puffs into the lungs every 6 (six) hours as needed for wheezing or shortness of breath. 6.7 g 2   albuterol  (PROVENTIL ) (2.5 MG/3ML) 0.083% nebulizer solution Take 3 mLs (2.5 mg total) by nebulization every 6 (six) hours as  needed for wheezing or shortness of breath. 75 mL 1   apixaban  (ELIQUIS ) 5 MG TABS tablet Take 1 tablet (5 mg total) by mouth 2 (two) times daily. 180 tablet 1   atorvastatin  (LIPITOR ) 80 MG tablet Take 1 tablet (80 mg total) by mouth daily. 90 tablet 1   azelastine  (ASTELIN ) 0.1 % nasal spray Place 2 sprays into both nostrils 2 (two) times daily. Use in each nostril as directed 30 mL 12   benzonatate  (TESSALON ) 100 MG capsule Take 1 capsule by mouth three times a day as need for cough 90 capsule 1   calcium  acetate (PHOSLO ) 667 MG capsule Take 1 capsule (667 mg total) by mouth 3 (three) times daily with meals. 270 capsule 2   carvedilol  (COREG ) 3.125 MG tablet Take 1 tablet (3.125 mg total) by mouth 2 (two) times daily. 180 tablet 3   fluticasone  (FLONASE ) 50 MCG/ACT nasal spray Place 2 sprays into both nostrils daily. 16 g 12   fluticasone -salmeterol (ADVAIR ) 100-50 MCG/ACT AEPB Inhale 1 puff into the lungs 2 (two) times daily. 60 each 2   gabapentin  (NEURONTIN ) 100 MG capsule Take 1 capsule (100 mg total) by mouth 3 (three) times daily. 270 capsule 1   glipiZIDE  (GLUCOTROL  XL) 2.5 MG 24 hr tablet Take 1 tablet (2.5 mg total) by  mouth daily as needed (for elevated BGL). 90 tablet 1   Homeopathic Products (EARACHE DROPS OT) Place 1 drop into both ears daily as needed (for ear aches).     isosorbide  mononitrate (IMDUR ) 30 MG 24 hr tablet Take 2 tablets (60 mg total) by mouth daily. Do not take on dialysis days. 30 tablet 2   lidocaine -prilocaine  (EMLA ) cream Apply a small amount to dialysis access one hour prior to dialysis. Wrap with plastic wrap. 30 g PRN   linagliptin  (TRADJENTA ) 5 MG TABS tablet Take 1 tablet (5 mg total) by mouth daily. 90 tablet 3   loratadine  (CLARITIN ) 10 MG tablet Take 1 tablet (10 mg total) by mouth daily. 90 tablet 1   midodrine  (PROAMATINE ) 10 MG tablet Take 1 tablet (10 mg total) by mouth 3 (three) times a week. Take 30 minutes prior to HD (Patient taking differently: Take 10 mg by mouth See admin instructions. Take 10 mg by mouth prior to dialysis only if blood pressure is low) 15 tablet 3   Misc. Devices [employer]  Diagnosis - unstable gait 1 each 0   Misc. Devices MISC Fistula sleeve for R arm.  Diagnoses: End-stage renal disease on hemodialysis 1 each 0   Misc. Devices MISC 1. Incontinence supplies.  2. Briefs.  Diagnosis-urinary incontinence 1 each 11   Misc. Devices MISC Nepro dialysis protein drink.  Diagnosis end-stage renal disease (Patient taking differently: Take 10 g by mouth See admin instructions. Nepro dialysis protein drink (Diagnosis end-stage renal disease)- Mix 10 grams of powder and mix into food daily as needed for low protein) 30 each 11   Misc. Devices MISC Blood pressure monitor.  Diagnosis hypertension 1 each 0   Misc. Devices MISC Portable oxygen concentrator, 2 L of oxygen.  Diagnosis chronic respiratory failure. 1 each 0   montelukast  (SINGULAIR ) 10 MG tablet Take 1 tablet (10 mg total) by mouth at bedtime. 90 tablet 1   olopatadine  (PATANOL) 0.1 % ophthalmic solution Place 1 drop into both eyes 2 (two) times daily. (Patient taking differently: Place 1 drop into both  eyes daily.) 5 mL 12   ondansetron  (ZOFRAN ) 4 MG tablet Take 4  mg by mouth every 8 (eight) hours as needed for nausea or vomiting.     Throat Lozenges (HALLS COUGH DROPS MT) Use as directed 1 lozenge in the mouth or throat every 2 (two) hours as needed (for coughing).     No facility-administered medications prior to visit.     ROS Review of Systems  Constitutional:  Negative for activity change and appetite change.  HENT:  Negative for sinus pressure and sore throat.   Respiratory:  Negative for chest tightness, shortness of breath and wheezing.   Cardiovascular:  Negative for chest pain and palpitations.  Gastrointestinal:  Negative for abdominal distention, abdominal pain and constipation.  Genitourinary: Negative.   Musculoskeletal:        See HPI  Psychiatric/Behavioral:  Negative for behavioral problems and dysphoric mood.     Objective:  BP 130/64   Pulse 79   Ht 5' 2 (1.575 m)   Wt 166 lb 9.6 oz (75.6 kg)   LMP  (LMP Unknown)   SpO2 94%   BMI 30.47 kg/m      04/07/2024   11:10 AM 04/05/2024   11:43 AM 03/29/2024    7:15 PM  BP/Weight  Systolic BP 130 116 147  Diastolic BP 64 68 126  Wt. (Lbs) 166.6 166   BMI 30.47 kg/m2 30.36 kg/m2       Physical Exam Constitutional:      Appearance: She is well-developed.  Cardiovascular:     Rate and Rhythm: Normal rate.     Heart sounds: Normal heart sounds. No murmur heard. Pulmonary:     Effort: Pulmonary effort is normal.     Breath sounds: Normal breath sounds. No wheezing or rales.  Chest:     Chest wall: No tenderness.  Abdominal:     General: Bowel sounds are normal. There is no distension.     Palpations: Abdomen is soft. There is no mass.     Tenderness: There is no abdominal tenderness.  Musculoskeletal:        General: Normal range of motion.     Right lower leg: No edema.     Left lower leg: No edema.  Neurological:     Mental Status: She is alert and oriented to person, place, and time.   Psychiatric:        Mood and Affect: Mood normal.        Latest Ref Rng & Units 03/29/2024    3:16 PM 03/01/2024    1:13 PM 01/27/2024    3:49 PM  CMP  Glucose 70 - 99 mg/dL 886     BUN 8 - 23 mg/dL 61     Creatinine 9.55 - 1.00 mg/dL 2.23     Sodium 864 - 854 mmol/L 131     Potassium 3.5 - 5.1 mmol/L 4.6     Chloride 98 - 111 mmol/L 92     CO2 22 - 32 mmol/L 25     Calcium  8.9 - 10.3 mg/dL 8.5     Total Protein 6.5 - 8.1 g/dL 7.1  7.4  CANCELED   Total Bilirubin 0.0 - 1.2 mg/dL 0.9     Alkaline Phos 38 - 126 U/L 82     AST 15 - 41 U/L 17     ALT 0 - 44 U/L 14       Lipid Panel     Component Value Date/Time   CHOL 106 09/01/2021 1041   CHOL 121 06/17/2021 0904   TRIG 61 09/01/2021  1041   HDL 46 09/01/2021 1041   HDL 46 06/17/2021 0904   CHOLHDL 2.3 09/01/2021 1041   VLDL 12 09/01/2021 1041   LDLCALC 48 09/01/2021 1041   LDLCALC 61 06/17/2021 0904    CBC    Component Value Date/Time   WBC 4.6 03/29/2024 1516   RBC 3.57 (L) 03/29/2024 1516   HGB 11.5 (L) 03/29/2024 1516   HGB 12.4 09/02/2018 1211   HCT 33.5 (L) 03/29/2024 1516   HCT 37.3 09/02/2018 1211   PLT 142 (L) 03/29/2024 1516   PLT 203 09/02/2018 1211   MCV 93.8 03/29/2024 1516   MCV 87 09/02/2018 1211   MCH 32.2 03/29/2024 1516   MCHC 34.3 03/29/2024 1516   RDW 17.1 (H) 03/29/2024 1516   RDW 13.1 09/02/2018 1211   LYMPHSABS 1.0 03/29/2024 1516   LYMPHSABS 1.2 09/02/2018 1211   MONOABS 0.7 03/29/2024 1516   EOSABS 0.2 03/29/2024 1516   EOSABS 0.1 09/02/2018 1211   BASOSABS 0.0 03/29/2024 1516   BASOSABS 0.0 09/02/2018 1211    Lab Results  Component Value Date   HGBA1C 6.7 03/01/2024        Assessment & Plan 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 Carvedilol  adjusted by cardiology. Isosorbide  mononitrate dose discrepancy noted.  Cardiology note indicates take 30 mg of  Imdur  daily but her med list reveals 60 daily. - Daughter will check with cardiology regarding corrective dose she should be taking but the patient has been taking 60 mg at home -Midodrine  recommended for low post-dialysis blood pressure. - Continue carvedilol  3.125 mg twice daily. - Continue isosorbide  mononitrate 60 mg in the evening as per current prescription. - Administer midodrine  10 mg prior to or after dialysis if blood pressure is low. - Monitor blood pressure at home and report significant fluctuations. - Communicate with cardiologist to clarify isosorbide  mononitrate dosing.   Muscle spasms associated with dialysis Muscle spasms likely due to electrolyte imbalances post-dialysis. Previous tizanidine  prescription noted. - Prescribe tizanidine  for muscle spasms as needed.  ESRD - Continue hemodialysis per schedule      Meds ordered this encounter  Medications   tiZANidine  (ZANAFLEX ) 4 MG tablet    Sig: Take 1 tablet (4 mg total) by mouth every 8 (eight) hours as needed for muscle spasms.    Dispense:  60 tablet    Refill:  1    Follow-up: Return for previously scheduled appointment.       Corrina Sabin, MD, FAAFP. [facility] [place], [place] [phone]   04/07/2024, 1:36 PM

## 2024-04-07 NOTE — Patient Instructions (Signed)
 VISIT SUMMARY:  Today, we discussed your fluctuating blood pressure readings, especially around your dialysis sessions. We also reviewed your medications and addressed your muscle spasms, abdominal pain, and vision changes. Your granddaughter has been very helpful in monitoring your blood pressure at home.  YOUR PLAN:  -HYPERTENSIVE CHRONIC KIDNEY DISEASE WITH END-STAGE RENAL DISEASE ON DIALYSIS: This condition means your kidneys are no longer able to work on their own, and you need dialysis to help clean your blood. Your blood pressure has been fluctuating, especially around dialysis sessions. Continue taking carvedilol  3.125 mg twice daily and isosorbide  mononitrate 60 mg in the evening. Use midodrine  10 mg before or after dialysis if your blood pressure is low. Keep monitoring your blood pressure at home and report any significant changes. We will also communicate with your cardiologist to clarify your isosorbide  mononitrate dosing.  -MUSCLE SPASMS ASSOCIATED WITH DIALYSIS: Muscle spasms after dialysis are likely due to changes in your electrolytes. You can take tizanidine  as needed to help manage these spasms.  -ABDOMINAL PAIN ASSOCIATED WITH HYPOTENSION POST-DIALYSIS: Your abdominal pain is likely due to low blood pressure or dehydration after dialysis. Use a hot compress and drink rice water with a little salt to help relieve the pain. Make sure you stay hydrated within the limits set by your dialysis fluid restrictions.  -VISION CHANGES SECONDARY TO BLOOD PRESSURE AND DIABETES: Your vision changes are related to your blood pressure and diabetes. Continue to follow up with your ophthalmologist for further evaluation and management. We may need to refer you to a specialist if necessary.  INSTRUCTIONS:  Please continue to monitor your blood pressure at home and report any significant fluctuations. Follow up with your cardiologist to clarify your isosorbide  mononitrate dosing. Keep your  appointment with your ophthalmologist for further evaluation of your vision changes. If you experience any new or worsening symptoms, please contact our office.

## 2024-04-08 ENCOUNTER — Other Ambulatory Visit (HOSPITAL_COMMUNITY): Payer: Self-pay

## 2024-04-08 ENCOUNTER — Other Ambulatory Visit: Payer: Self-pay | Admitting: Family Medicine

## 2024-04-08 DIAGNOSIS — N186 End stage renal disease: Secondary | ICD-10-CM | POA: Diagnosis not present

## 2024-04-08 DIAGNOSIS — Z992 Dependence on renal dialysis: Secondary | ICD-10-CM | POA: Diagnosis not present

## 2024-04-08 DIAGNOSIS — N2581 Secondary hyperparathyroidism of renal origin: Secondary | ICD-10-CM | POA: Diagnosis not present

## 2024-04-08 DIAGNOSIS — E1149 Type 2 diabetes mellitus with other diabetic neurological complication: Secondary | ICD-10-CM

## 2024-04-10 DIAGNOSIS — N186 End stage renal disease: Secondary | ICD-10-CM | POA: Diagnosis not present

## 2024-04-10 DIAGNOSIS — I129 Hypertensive chronic kidney disease with stage 1 through stage 4 chronic kidney disease, or unspecified chronic kidney disease: Secondary | ICD-10-CM | POA: Diagnosis not present

## 2024-04-10 DIAGNOSIS — Z992 Dependence on renal dialysis: Secondary | ICD-10-CM | POA: Diagnosis not present

## 2024-04-11 DIAGNOSIS — N186 End stage renal disease: Secondary | ICD-10-CM | POA: Diagnosis not present

## 2024-04-11 DIAGNOSIS — N2581 Secondary hyperparathyroidism of renal origin: Secondary | ICD-10-CM | POA: Diagnosis not present

## 2024-04-11 DIAGNOSIS — Z992 Dependence on renal dialysis: Secondary | ICD-10-CM | POA: Diagnosis not present

## 2024-04-12 ENCOUNTER — Ambulatory Visit: Admitting: Family Medicine

## 2024-04-12 ENCOUNTER — Other Ambulatory Visit (HOSPITAL_COMMUNITY): Payer: Self-pay

## 2024-04-12 ENCOUNTER — Ambulatory Visit: Attending: Family Medicine | Admitting: Physical Therapy

## 2024-04-12 DIAGNOSIS — M6281 Muscle weakness (generalized): Secondary | ICD-10-CM | POA: Diagnosis not present

## 2024-04-12 DIAGNOSIS — R262 Difficulty in walking, not elsewhere classified: Secondary | ICD-10-CM | POA: Insufficient documentation

## 2024-04-12 DIAGNOSIS — R2689 Other abnormalities of gait and mobility: Secondary | ICD-10-CM | POA: Insufficient documentation

## 2024-04-12 DIAGNOSIS — M5459 Other low back pain: Secondary | ICD-10-CM | POA: Diagnosis not present

## 2024-04-12 DIAGNOSIS — M25551 Pain in right hip: Secondary | ICD-10-CM | POA: Insufficient documentation

## 2024-04-12 NOTE — Therapy (Signed)
 OUTPATIENT PHYSICAL THERAPY THORACOLUMBAR     Patient Name: Paula [name]  MRN: [id number] DOB:[birth year], 78 y.o., female Today's Date: 04/12/2024  END OF SESSION:  PT End of Session - 04/12/24 1234     Visit Number 11    Date for PT Re-Evaluation 04/22/24    Authorization Type [employer]    PT Start Time 1230    PT Stop Time 1315    PT Time Calculation (min) 45 min             Past Medical History:  Diagnosis Date   Allergic rhinitis    Allergy  1/12000   Arthritis    Asthma    Brain aneurysm    Cough    Diabetes mellitus    Diastolic CHF, chronic (HCC) 10/11/2011   ESRD on hemodialysis (HCC)    MWF   GERD (gastroesophageal reflux disease)    History of colon polyps 2012   tubular adenoma    Hyperlipidemia    Hypertension    Neuropathy 10/11/2011   PONV (postoperative nausea and vomiting)    one time after lymph node surgery   Past Surgical History:  Procedure Laterality Date   AV FISTULA PLACEMENT Right 10/15/2021   Procedure: ARTERIOVENOUS (AV) FISTULA  WITH PLACEMENT OF GORE-TEX STRETCH  GRAFT (4-48mmx45cm);  Surgeon: Eliza Lonni RAMAN, MD;  Location: [facility] OR;  Service: Vascular;  Laterality: Right;   CATARACT EXTRACTION     right eye   IR 3D INDEPENDENT WKST  12/14/2017   IR ANGIO INTRA EXTRACRAN SEL COM CAROTID INNOMINATE BILAT MOD SED  09/15/2017   IR ANGIO INTRA EXTRACRAN SEL COM CAROTID INNOMINATE UNI L MOD SED  12/14/2017   IR ANGIO VERTEBRAL SEL VERTEBRAL BILAT MOD SED  09/15/2017   IR RADIOLOGIST EVAL & MGMT  09/10/2017   IR RADIOLOGIST EVAL & MGMT  10/19/2017   lymphatic mass surgery     NASAL TURBINATE REDUCTION     RADIOLOGY WITH ANESTHESIA N/A 12/14/2017   Procedure: RADIOLOGY WITH ANESTHESIA EMBOLIZATION;  Surgeon: Dolphus Carrion, MD;  Location: [facility] OR;  Service: Radiology;  Laterality: N/A;   VIDEO BRONCHOSCOPY Bilateral 11/18/2017   Procedure: VIDEO BRONCHOSCOPY WITHOUT FLUORO;  Surgeon: Shelah Lamar RAMAN, MD;  Location: [facility];   Service: Cardiopulmonary;  Laterality: Bilateral;   Patient Active Problem List   Diagnosis Date Noted   Hypoxemia 12/29/2023   Abnormal electrocardiogram (ECG) (EKG) 12/29/2023   Left ventricular hypertrophy 12/29/2023   CHF (congestive heart failure) (HCC) 10/12/2023   Blurry vision 10/11/2023   Anemia due to chronic kidney disease 10/11/2023   Acute respiratory failure with hypoxia (HCC) 08/17/2023   PNA (pneumonia) 08/17/2023   Volume overload 08/17/2023   Acute hypoxic respiratory failure (HCC) 08/17/2023   SNHL (sensory-neural hearing loss), 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   ESRD on dialysis (HCC) 11/20/2022   Mold exposure 08/14/2022   Opioid withdrawal (HCC) 08/14/2022   ILD (interstitial lung disease) (HCC) 08/14/2022   Secondary hyperparathyroidism, renal (HCC) 08/14/2022   Paroxysmal A-fib (HCC) 05/20/2022   Hypertrophy of nasal turbinates 09/05/2021   Chronic kidney disease, stage 5 (HCC)    Aortic atherosclerosis (HCC) 06/24/2021   Chronic rhinitis 10/01/2017   Seasonal allergic conjunctivitis 10/01/2017   Aneurysm (HCC) 09/24/2017   Acute metabolic encephalopathy 09/02/2017   Allergic rhinitis 08/20/2017   Palpitations 07/10/2016   Gout 01/23/2016   History of cataract 02/27/2015   Seasonal and perennial allergic rhinitis 02/15/2012   Chronic diastolic CHF (congestive heart failure) (HCC) 10/11/2011   GERD (gastroesophageal reflux  disease)    Type 2 diabetes mellitus without complication, without long-term current use of insulin  (HCC) 07/03/2010   Hyperlipidemia 07/03/2010   Essential hypertension 07/03/2010    PCP: Paula [name]  REFERRING PROVIDER: Corrina [name]  REFERRING DIAG: M54.50,G89.29 (ICD-10-CM) - Chronic right-sided low back pain without sciaticaM25.551 (ICD-10-CM) - Right hip pain  Rationale for Evaluation and Treatment: Rehabilitation  THERAPY DIAG:  Muscle weakness (generalized)  Difficulty in walking, not elsewhere classified  Pain in  right hip  Other low back pain  ONSET DATE: 10/11/23  SUBJECTIVE:                                                                                                                                                                                           SUBJECTIVE STATEMENT: 60% better. HEP helps. Standing is limited to about 5 min. 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   She experiences pain in the lower back, specifically in the right hip and lower back, which makes standing for more than two minutes difficult. The pain is localized to the hip area without radiation down the leg. She is receiving physical therapy at home and is interested in outpatient therapy. Tylenol  and a heating pad provide pain relief. Hip x-ray from 2024 revealed degenerative changes send pubic symphysis, both hips, SI joints and lower lumbar spine.  She fell from her bed last Monday, resulting in pain on the left side of her head and right shoulder, which was swollen but has improved. No current swelling or pain with pressure. She experiences dizziness at times and uses a cane occasionally.    PAIN:  Are you having pain? Yes: NPRS scale: 7/10 low back pain Pain location: R hip and midline low back  Pain description: not there all the time, if I am sitting I am okay Aggravating factors: when I get up, when I am standing, walking  Relieving factors:  Tylenol  and gabapentin ,   PRECAUTIONS: Fall  RED FLAGS: None   WEIGHT BEARING RESTRICTIONS: No  FALLS:  Has patient fallen in last 6 months? Yes. Number of falls 2 knee gave out in March, then  1 time fell out of the bed   LIVING ENVIRONMENT: Lives with: lives with their family Lives in: House/apartment Stairs: Yes: Internal: 14 steps; on right going up and the wall on the other side Has following equipment at home: Single point cane and Walker - 2 wheeled  OCCUPATION: N/A  PLOF: Independent with basic ADLs  PATIENT GOALS: to get rid of the pain, be able to walk and stand for longer than 2 mins without the pain  NEXT MD VISIT: 03/01/24  OBJECTIVE:  Note: Objective measures were completed at Evaluation unless otherwise noted.  DIAGNOSTIC FINDINGS:  X ray ordered for hips, lumbar, and knees   COGNITION: Overall cognitive status: Within functional limits for tasks assessed     SENSATION: WFL  POSTURE: rounded shoulders, forward head, and increased thoracic kyphosis  PALPATION: TTP and pain lumbar spine   LUMBAR ROM:   AROM eval 01/14/24 01/21/24 03/08/24 03/21/24  Flexion Distal shin with pain [facility]   WFL  Extension 25% available Limited 25% Limited 25% [facility] WFL  Right lateral flexion Mid thigh with pain Limited 25% Limited 25% 25% limited  WFL  Left lateral flexion Mid thigh with pain WFL   WFL  Right rotation 75% available with pain [facility]   WFL  Left rotation 75% available with pain WFL   WFL   (Blank rows = not tested)  LOWER EXTREMITY ROM:   grossly WFL   LOWER EXTREMITY MMT:  grossly 4-/5 BLE    FUNCTIONAL TESTS:  5 times sit to stand: 43s from elevated mat and needing UE push off, loses balance on to heels  Timed up and go (TUG): 24s   GAIT: Distance walked: in clinic distances Assistive device utilized: None Level of assistance: CGA Comments: furniture walking, or reaches out to grab daughter's hand to walk  TREATMENT DATE:   04/12/24 Nustep L 6 7  min STS 5 x 14.2 sec, assessed goals Knee ext 5# 2 sets 10 HS curl 20# 2 sets 10 STS with wt ball press 10x then 10 x OH Cable pulley shld ext 5# 2 sets 10 Resisted trunk ext 20 x Leg Press 30# 2 sets 10 Passive stretching to bilateral HS, piriformis, Glute, ITB      03/22/24 Bike L 3x 5 min Goals  5x sit tot stand 22.23 sec Rows & Ext green 2x10 Passive stretching to bilateral HS, piriformis, Glute, ITB Gait 114ft ~ 52.41 seconds to complete  03/08/24 NuStep L 5 x 6 min  HS curls 20lb 2x10 Leg Ext 5lb 2x10 Rows & Ext green 2x10 Passive stretching to bilateral HS, piriformis, Glute, ITB  02/16/24 NuStep Leg ext HS curls  STS on airex Walking on beam  Step ups   02/11/24 NuStep L5x73mins Shoulder ext 5# 2x10 Step ups 4  Feet on ball rotations and knees to chest x10 Bridges 2x10 Passive stretching HS, glutes, SKTC, LTR STS 2x10  01/28/24 NuStep L5x67mins  STS on airex 2x10 HS curls 20lb 2x10 Leg Ext 10lb 2x10 Shoulder ext and rows green band 2x10 Passive stretching- HS, SKTC, LTR     01/21/24 NuStep L5 x 6 min Goals  Lumbar ROM 5x S2S 17.77 LE did push against table on first rep HS curls 20lb 2x10 Leg Ext 10lb 2x10 Shoulder Ext 5lb 2x10 Bridges x10 Feet on ball bridge, obl and HS curl PROM BIL LE and trunk- tightness RT> left.  HS, piriformis, Double and single K2C   STM to R ITB  01/14/24 NuStep L5 x  6 min Goals  Lumbar ORM 5x S2S 19.54 LE did push against table on first rep  Gait three laps ~ 345ft some fatigue and instability at times  HS curls 20lb 2x10 Leg Ext 10lb 2x10 Feet on ball bridge, obl and HS curl PROM BIL LE and trunk- tightness RT> left. RT IT tenderness  HS, piriformis, Double and single [facility]  01/12/24 Nustep L5  6 min 20lb resisted side steps x5 Hip add ball squeeze 2x10 Hip abd green 2x10 Sit to stand 2x10 Feet on ball bridge, obl and HS curl PROM BIL LE and trunk- tightness RT> left. RT IT tenderness  HS, piriformis, Double  and single [facility]   01/07/24 Nustep L 4 6 min Standing red tband shld ext and row 2 sets 10 Standing red tband hip ext and abd 10 xeach Seated trunk ext 2 sets 10 green tband STS 2 sets 5 with wt ball chest press Seated clams and hip flex 2 sets 10 Seated ADD with ball squeeze PROM BIL LE and trunk- tightness RT> left. RT IT tenderness Feet on ball bridge, obl and HS curl Isometric abdominals   12/31/23 Nustep L 3 Feet on ball bridge, KTC and obl 10 x each Isometric abdominals 10 x hold 3 sec ADD ball squeeze 10 x then 10 x with bridge Red tband clams 2 sets 10  Red tband marching 2 sets 10 PROM BIL LE And trunk- very tight STS 5x with UE and the elevated mat without UE 5 x Seated row and shld ext 2 sets 10 Seated trunk ext 2 sets 10 green tband     12/24/23- EVAL, HEP                                                                                                                                 PATIENT EDUCATION:  Education details: POC, HEP, importance of movement Person educated: Patient and Child(ren) Education method: Medical illustrator Education comprehension: verbalized understanding  HOME EXERCISE PROGRAM: Access Code: [email or web address] URL: [email or web address] Date: 01/12/2024 Prepared by: Tanda Sorrow  Exercises - Supine Lower Trunk Rotation  - 1 x daily - 7 x weekly - 2 sets - 10 reps - Seated March  - 1 x daily - 7 x weekly - 2 sets - 10 reps - Seated Hip Abduction with Resistance  - 1 x daily - 7 x weekly - 2 sets - 10 reps - Sit to Stand  - 1 x daily - 7 x weekly - 2 sets - 5 reps - Supine Bridge  - 1 x daily - 7 x weekly - 3 sets - 10 reps - Standing Hip Abduction with Counter Support  - 1 x daily - 7 x weekly - 3 sets - 10 reps  ASSESSMENT:  CLINICAL IMPRESSION:   60% better. HEP helps. Standing is limited to about 5 min. Multi health issues limits attendance but therapy helps keep me healthy. Denies  any recent falls since  last one reported. Pt reports she wants to get stronger to be able to be more active and less pain. Assessed and documented goals.    Patient is a 78 y.o. female who was seen today for physical therapy evaluation and treatment for balance, gait, low back and hip pain. She is ambulating without a device but is very unsteady and shuffles her feet. She was advised to use the [facility] at the least. Patient has a chronic cough that is pretty bad. Her daughter reports she is not complaint with HEP and spends most of the day sitting on the sofa. She presents with LE weakness and balance deficits that put her at a high fall risk. She states that her back and hip pain prevent her from doing anything. Patient is very wobbly when going from sit to stand especially after prolonged sitting. She will benefit from skilled PT to address her gait and balance impairments and decrease her hip and back pain to be more mobile.   OBJECTIVE IMPAIRMENTS: Abnormal gait, cardiopulmonary status limiting activity, decreased activity tolerance, decreased balance, decreased coordination, decreased endurance, decreased mobility, difficulty walking, decreased ROM, decreased strength, postural dysfunction, and pain.   ACTIVITY LIMITATIONS: lifting, bending, sitting, standing, squatting, transfers, and locomotion level  PARTICIPATION LIMITATIONS: meal prep, cleaning, laundry, shopping, and community activity  PERSONAL FACTORS: Age, Behavior pattern, Past/current experiences, Time since onset of injury/illness/exacerbation, and 3+ comorbidities: CHF, ESRD on dialysis, neuropathy, chronic cough are also affecting patient's functional outcome.   REHAB POTENTIAL: Fair    CLINICAL DECISION MAKING: Stable/uncomplicated  EVALUATION COMPLEXITY: Low   GOALS: Goals reviewed with patient? Yes  SHORT TERM GOALS: Target date: 02/04/24  Patient will be independent with initial HEP. Baseline: given 12/24/23 Goal status: 01/07/24 MET  2.   Patient will demonstrate decreased fall risk by scoring < 15 sec on TUG. Baseline: 24s Goal status: 01/07/24 MET   LONG TERM GOALS: Target date: 03/17/24  Patient will be independent with advanced/ongoing HEP to improve outcomes and carryover.  Baseline:  Goal status: ongoing 03/22/24  04/12/24 MET  2.  Patient will be able to ambulate 500' with good safety to access community.  Baseline: not using AD, very unsteady and unable to walk or stand more than 2 mins Goal status: Progressing slow gait 03/08/24, ongoing 03/22/24  progressing 04/12/24  3.  Patient will report 50-75% improvement in R hip and low back pain Baseline: 10/10  Goal status: Progressing 7/10, Met 03/22/24  MET 04/12/24  4.  Patient will demonstrate improved functional LE strength as demonstrated by 5xSTS <20s Baseline: 43s elevated mat and UE push off Goal status: Progressing 01/14/24, Progressing 01/21/24,  ongoing 03/21/24  MET 04/12/24  5.  Patient will increase standing and walking tolerance to 10 mins  Baseline: 2-5 mins  Goal status: Ongoing 3 min 03/08/24, ongoing 03/22/24  and 04/12/24   PLAN:  PT FREQUENCY: 2x/week  PT DURATION: 12 weeks  PLANNED INTERVENTIONS: 97110-Therapeutic exercises, 97530- Therapeutic activity, 97112- Neuromuscular re-education, 97535- Self Care, [id number]- Manual therapy, [phone]- Gait training, [phone]- Electrical stimulation (unattended), Patient/Family education, Balance training, Stair training, Dry Needling, Joint mobilization, Spinal mobilization, Cryotherapy, and Moist heat.  PLAN FOR NEXT SESSION: LE strengthening, balance training, low back and hip stretches .  Will ask for add'l visits 1-2 x a week 4-6 weeks   Adryanna Friedt,ANGIE, PTA 04/12/2024, 12:34 PM

## 2024-04-13 DIAGNOSIS — Z992 Dependence on renal dialysis: Secondary | ICD-10-CM | POA: Diagnosis not present

## 2024-04-13 DIAGNOSIS — N186 End stage renal disease: Secondary | ICD-10-CM | POA: Diagnosis not present

## 2024-04-13 DIAGNOSIS — N2581 Secondary hyperparathyroidism of renal origin: Secondary | ICD-10-CM | POA: Diagnosis not present

## 2024-04-14 ENCOUNTER — Ambulatory Visit: Admitting: Physical Therapy

## 2024-04-14 DIAGNOSIS — M25551 Pain in right hip: Secondary | ICD-10-CM

## 2024-04-14 DIAGNOSIS — R262 Difficulty in walking, not elsewhere classified: Secondary | ICD-10-CM | POA: Diagnosis not present

## 2024-04-14 DIAGNOSIS — M6281 Muscle weakness (generalized): Secondary | ICD-10-CM | POA: Diagnosis not present

## 2024-04-14 DIAGNOSIS — R2689 Other abnormalities of gait and mobility: Secondary | ICD-10-CM | POA: Diagnosis not present

## 2024-04-14 DIAGNOSIS — M5459 Other low back pain: Secondary | ICD-10-CM | POA: Diagnosis not present

## 2024-04-14 NOTE — Therapy (Signed)
 OUTPATIENT PHYSICAL THERAPY THORACOLUMBAR     Patient Name: Paula [name]  MRN: [id number] DOB:26-Dec-1945, 78 y.o., female Today's Date: 04/14/2024  END OF SESSION:  PT End of Session - 04/14/24 1151     Visit Number 12    Date for PT Re-Evaluation 04/22/24    Authorization Type [employer]    PT Start Time 1150    PT Stop Time 1230    PT Time Calculation (min) 40 min             Past Medical History:  Diagnosis Date   Allergic rhinitis    Allergy  1/12000   Arthritis    Asthma    Brain aneurysm    Cough    Diabetes mellitus    Diastolic CHF, chronic (HCC) 10/11/2011   ESRD on hemodialysis (HCC)    MWF   GERD (gastroesophageal reflux disease)    History of colon polyps 2012   tubular adenoma    Hyperlipidemia    Hypertension    Neuropathy 10/11/2011   PONV (postoperative nausea and vomiting)    one time after lymph node surgery   Past Surgical History:  Procedure Laterality Date   AV FISTULA PLACEMENT Right 10/15/2021   Procedure: ARTERIOVENOUS (AV) FISTULA  WITH PLACEMENT OF GORE-TEX STRETCH  GRAFT (4-87mmx45cm);  Surgeon: Eliza Lonni RAMAN, MD;  Location: [facility] OR;  Service: Vascular;  Laterality: Right;   CATARACT EXTRACTION     right eye   IR 3D INDEPENDENT WKST  12/14/2017   IR ANGIO INTRA EXTRACRAN SEL COM CAROTID INNOMINATE BILAT MOD SED  09/15/2017   IR ANGIO INTRA EXTRACRAN SEL COM CAROTID INNOMINATE UNI L MOD SED  12/14/2017   IR ANGIO VERTEBRAL SEL VERTEBRAL BILAT MOD SED  09/15/2017   IR RADIOLOGIST EVAL & MGMT  09/10/2017   IR RADIOLOGIST EVAL & MGMT  10/19/2017   lymphatic mass surgery     NASAL TURBINATE REDUCTION     RADIOLOGY WITH ANESTHESIA N/A 12/14/2017   Procedure: RADIOLOGY WITH ANESTHESIA EMBOLIZATION;  Surgeon: Dolphus Carrion, MD;  Location: [facility] OR;  Service: Radiology;  Laterality: N/A;   VIDEO BRONCHOSCOPY Bilateral 11/18/2017   Procedure: VIDEO BRONCHOSCOPY WITHOUT FLUORO;  Surgeon: Shelah Lamar RAMAN, MD;  Location: [facility] ENDOSCOPY;   Service: Cardiopulmonary;  Laterality: Bilateral;   Patient Active Problem List   Diagnosis Date Noted   Hypoxemia 12/29/2023   Abnormal electrocardiogram (ECG) (EKG) 12/29/2023   Left ventricular hypertrophy 12/29/2023   CHF (congestive heart failure) (HCC) 10/12/2023   Blurry vision 10/11/2023   Anemia due to chronic kidney disease 10/11/2023   Acute respiratory failure with hypoxia (HCC) 08/17/2023   PNA (pneumonia) 08/17/2023   Volume overload 08/17/2023   Acute hypoxic respiratory failure (HCC) 08/17/2023   SNHL (sensory-neural hearing loss), 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   ESRD on dialysis (HCC) 11/20/2022   Mold exposure 08/14/2022   Opioid withdrawal (HCC) 08/14/2022   ILD (interstitial lung disease) (HCC) 08/14/2022   Secondary hyperparathyroidism, renal (HCC) 08/14/2022   Paroxysmal A-fib (HCC) 05/20/2022   Hypertrophy of nasal turbinates 09/05/2021   Chronic kidney disease, stage 5 (HCC)    Aortic atherosclerosis (HCC) 06/24/2021   Chronic rhinitis 10/01/2017   Seasonal allergic conjunctivitis 10/01/2017   Aneurysm (HCC) 09/24/2017   Acute metabolic encephalopathy 09/02/2017   Allergic rhinitis 08/20/2017   Palpitations 07/10/2016   Gout 01/23/2016   History of cataract 02/27/2015   Seasonal and perennial allergic rhinitis 02/15/2012   Chronic diastolic CHF (congestive heart failure) (HCC) 10/11/2011   GERD (gastroesophageal reflux  disease)    Type 2 diabetes mellitus without complication, without long-term current use of insulin  (HCC) 07/03/2010   Hyperlipidemia 07/03/2010   Essential hypertension 07/03/2010    PCP: Corrina Sabin  REFERRING PROVIDER: Corrina Sabin  REFERRING DIAG: M54.50,G89.29 (ICD-10-CM) - Chronic right-sided low back pain without sciaticaM25.551 (ICD-10-CM) - Right hip pain  Rationale for Evaluation and Treatment: Rehabilitation  THERAPY DIAG:  Muscle weakness (generalized)  Pain in right hip  Other low back pain  ONSET DATE:  10/11/23  SUBJECTIVE:                                                                                                                                                                                           SUBJECTIVE STATEMENT: Doing okay, alittle sore after last session PERTINENT HISTORY:  Paula [name]  is a 78 year old female with  a history of type 2 diabetes mellitus (A1c 76.9), hypertension, hypercholesterolemia, allergic rhinitis, severe upper airway cough syndrome, stage V CKD (managed by Dr [facility], currently on hemodialysis Tuesday, Thursday and Saturday), 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   She experiences pain in the lower back, specifically in the right hip and lower back, which makes standing for more than two minutes difficult. The pain is localized to the hip area without radiation down the leg. She is receiving physical therapy at home and is interested in outpatient therapy. Tylenol  and a heating pad provide pain relief. Hip x-ray from 2024 revealed degenerative changes send pubic symphysis, both hips, SI joints and lower lumbar spine.  She fell from her bed last Monday, resulting in pain on the left side of her head and right shoulder, which was swollen but has improved. No current swelling or pain with pressure. She experiences dizziness at times and uses a cane occasionally.    PAIN:  Are you having pain? Yes: NPRS scale: 7/10 low back pain Pain location: R hip and midline low back  Pain description: not there all the time, if I am sitting I am okay Aggravating factors: when I get up, when I am standing, walking  Relieving factors: Tylenol  and gabapentin ,   PRECAUTIONS: Fall  RED FLAGS: None   WEIGHT BEARING RESTRICTIONS: No  FALLS:  Has patient fallen in  last 6 months? Yes. Number of falls 2 knee gave out in March, then 1 time fell out of the bed   LIVING ENVIRONMENT: Lives with: lives with their family Lives in: House/apartment Stairs: Yes: Internal:  14 steps; on right going up and the wall on the other side Has following equipment at home: Single point cane and Walker - 2 wheeled  OCCUPATION: N/A  PLOF: Independent with basic ADLs  PATIENT GOALS: to get rid of the pain, be able to walk and stand for longer than 2 mins without the pain  NEXT MD VISIT: 03/01/24  OBJECTIVE:  Note: Objective measures were completed at Evaluation unless otherwise noted.  DIAGNOSTIC FINDINGS:  X ray ordered for hips, lumbar, and knees   COGNITION: Overall cognitive status: Within functional limits for tasks assessed     SENSATION: WFL  POSTURE: rounded shoulders, forward head, and increased thoracic kyphosis  PALPATION: TTP and pain lumbar spine   LUMBAR ROM:   AROM eval 01/14/24 01/21/24 03/08/24 03/21/24  Flexion Distal shin with pain [facility]   WFL  Extension 25% available Limited 25% Limited 25% [facility] WFL  Right lateral flexion Mid thigh with pain Limited 25% Limited 25% 25% limited  WFL  Left lateral flexion Mid thigh with pain WFL   WFL  Right rotation 75% available with pain [facility]   WFL  Left rotation 75% available with pain WFL   WFL   (Blank rows = not tested)  LOWER EXTREMITY ROM:   grossly WFL   LOWER EXTREMITY MMT:  grossly 4-/5 BLE    FUNCTIONAL TESTS:  5 times sit to stand: 43s from elevated mat and needing UE push off, loses balance on to heels  Timed up and go (TUG): 24s   GAIT: Distance walked: in clinic distances Assistive device utilized: None Level of assistance: CGA Comments: furniture walking, or reaches out to grab daughter's hand to walk  TREATMENT DATE:   04/14/24 Nustep L 6 Resisted gait 30# 4 ways 4 x each 10# cable row 10 x 5# cable shld ext 10x 5# cable hip 4 way 10 x- pain and weakness noted RT LB/glut  with ex. Left side weakness with ext and abd but no pain Passive stretching to bilateral HS, piriformis, Glute, ITB   04/12/24 Nustep L 6 7 min STS 5 x 14.2 sec, assessed goals Knee ext 5# 2 sets 10 HS curl 20# 2 sets 10 STS with wt ball press 10x then 10 x OH Cable pulley shld ext 5# 2 sets 10 Resisted trunk ext 20 x Leg Press 30# 2 sets 10 Passive stretching to bilateral HS, piriformis, Glute, ITB      03/22/24 Bike L 3x 5 min Goals  5x sit tot stand 22.23 sec Rows & Ext green 2x10 Passive stretching to bilateral HS, piriformis, Glute, ITB Gait 165ft ~ 52.41 seconds to complete  03/08/24 NuStep L 5 x 6 min  HS curls 20lb 2x10 Leg Ext 5lb 2x10 Rows & Ext green 2x10 Passive stretching to bilateral HS, piriformis, Glute, ITB  02/16/24 NuStep Leg ext HS curls  STS on airex Walking on beam  Step ups   02/11/24 NuStep L5x54mins Shoulder ext 5# 2x10 Step ups 4  Feet on ball rotations and knees to chest x10 Bridges 2x10 Passive stretching HS, glutes, SKTC, LTR STS 2x10  01/28/24 NuStep L5x23mins  STS on airex 2x10 HS curls 20lb 2x10 Leg Ext 10lb 2x10 Shoulder ext and rows green band 2x10 Passive stretching- HS, SKTC, LTR     01/21/24 NuStep L5 x 6 min Goals  Lumbar ROM 5x S2S 17.77 LE did push against table on first rep HS curls 20lb 2x10 Leg Ext 10lb 2x10 Shoulder Ext 5lb 2x10 Kallie  x10 Feet on ball bridge, obl and HS curl PROM BIL LE and trunk- tightness RT> left.  HS, piriformis, Double and single K2C   STM to R ITB  01/14/24 NuStep L5 x 6 min Goals  Lumbar ORM 5x S2S 19.54 LE did push against table on first rep  Gait three laps ~ 319ft some fatigue and instability at times  HS curls 20lb 2x10 Leg Ext 10lb 2x10 Feet on ball bridge, obl and HS curl PROM BIL LE and trunk- tightness RT> left. RT IT tenderness  HS, piriformis, Double and single [facility]  01/12/24 Nustep L5  6 min 20lb resisted side steps x5 Hip add ball squeeze 2x10 Hip abd green  2x10 Sit to stand 2x10 Feet on ball bridge, obl and HS curl PROM BIL LE and trunk- tightness RT> left. RT IT tenderness  HS, piriformis, Double and single [facility]   01/07/24 Nustep L 4 6 min Standing red tband shld ext and row 2 sets 10 Standing red tband hip ext and abd 10 xeach Seated trunk ext 2 sets 10 green tband STS 2 sets 5 with wt ball chest press Seated clams and hip flex 2 sets 10 Seated ADD with ball squeeze PROM BIL LE and trunk- tightness RT> left. RT IT tenderness Feet on ball bridge, obl and HS curl Isometric abdominals   12/31/23 Nustep L 3 Feet on ball bridge, KTC and obl 10 x each Isometric abdominals 10 x hold 3 sec ADD ball squeeze 10 x then 10 x with bridge Red tband clams 2 sets 10  Red tband marching 2 sets 10 PROM BIL LE And trunk- very tight STS 5x with UE and the elevated mat without UE 5 x Seated row and shld ext 2 sets 10 Seated trunk ext 2 sets 10 green tband     12/24/23- EVAL, HEP                                                                                                                                 PATIENT EDUCATION:  Education details: POC, HEP, importance of movement Person educated: Patient and Child(ren) Education method: Medical illustrator Education comprehension: verbalized understanding  HOME EXERCISE PROGRAM: Access Code: [email or web address] URL: [email or web address] Date: 01/12/2024 Prepared by: Tanda Sorrow  Exercises - Supine Lower Trunk Rotation  - 1 x daily - 7 x weekly - 2 sets - 10 reps - Seated March  - 1 x daily - 7 x weekly - 2 sets - 10 reps - Seated Hip Abduction with Resistance  - 1 x daily - 7 x weekly - 2 sets - 10 reps - Sit to Stand  - 1 x daily - 7 x weekly - 2 sets - 5 reps - Supine Bridge  - 1 x daily - 7 x weekly - 3 sets - 10 reps - Standing Hip Abduction with Counter Support  - 1 x daily - 7 x weekly -  3 sets - 10 reps  ASSESSMENT:  CLINICAL IMPRESSION:  did more ex in  standing today and she did have pain with prolonged standing requiring seated rest. Weakness in hips with cable ex and pain when doing left LE. Cuing verb and tactile needed.  Patient is a 78 y.o. female who was seen today for physical therapy evaluation and treatment for balance, gait, low back and hip pain. She is ambulating without a device but is very unsteady and shuffles her feet. She was advised to use the [facility] at the least. Patient has a chronic cough that is pretty bad. Her daughter reports she is not complaint with HEP and spends most of the day sitting on the sofa. She presents with LE weakness and balance deficits that put her at a high fall risk. She states that her back and hip pain prevent her from doing anything. Patient is very wobbly when going from sit to stand especially after prolonged sitting. She will benefit from skilled PT to address her gait and balance impairments and decrease her hip and back pain to be more mobile.   OBJECTIVE IMPAIRMENTS: Abnormal gait, cardiopulmonary status limiting activity, decreased activity tolerance, decreased balance, decreased coordination, decreased endurance, decreased mobility, difficulty walking, decreased ROM, decreased strength, postural dysfunction, and pain.   ACTIVITY LIMITATIONS: lifting, bending, sitting, standing, squatting, transfers, and locomotion level  PARTICIPATION LIMITATIONS: meal prep, cleaning, laundry, shopping, and community activity  PERSONAL FACTORS: Age, Behavior pattern, Past/current experiences, Time since onset of injury/illness/exacerbation, and 3+ comorbidities: CHF, ESRD on dialysis, neuropathy, chronic cough are also affecting patient's functional outcome.   REHAB POTENTIAL: Fair    CLINICAL DECISION MAKING: Stable/uncomplicated  EVALUATION COMPLEXITY: Low   GOALS: Goals reviewed with patient? Yes  SHORT TERM GOALS: Target date: 02/04/24  Patient will be independent with initial HEP. Baseline: given  12/24/23 Goal status: 01/07/24 MET  2.  Patient will demonstrate decreased fall risk by scoring < 15 sec on TUG. Baseline: 24s Goal status: 01/07/24 MET   LONG TERM GOALS: Target date: 03/17/24  Patient will be independent with advanced/ongoing HEP to improve outcomes and carryover.  Baseline:  Goal status: ongoing 03/22/24  04/12/24 MET  2.  Patient will be able to ambulate 500' with good safety to access community.  Baseline: not using AD, very unsteady and unable to walk or stand more than 2 mins Goal status: Progressing slow gait 03/08/24, ongoing 03/22/24  progressing 04/12/24  3.  Patient will report 50-75% improvement in R hip and low back pain Baseline: 10/10  Goal status: Progressing 7/10, Met 03/22/24  MET 04/12/24  4.  Patient will demonstrate improved functional LE strength as demonstrated by 5xSTS <20s Baseline: 43s elevated mat and UE push off Goal status: Progressing 01/14/24, Progressing 01/21/24,  ongoing 03/21/24  MET 04/12/24  5.  Patient will increase standing and walking tolerance to 10 mins  Baseline: 2-5 mins  Goal status: Ongoing 3 min 03/08/24, ongoing 03/22/24  and 04/12/24   PLAN:  PT FREQUENCY: 2x/week  PT DURATION: 12 weeks  PLANNED INTERVENTIONS: 97110-Therapeutic exercises, 97530- Therapeutic activity, 97112- Neuromuscular re-education, 97535- Self Care, [id number]- Manual therapy, [phone]- Gait training, [phone]- Electrical stimulation (unattended), Patient/Family education, Balance training, Stair training, Dry Needling, Joint mobilization, Spinal mobilization, Cryotherapy, and Moist heat.  PLAN FOR NEXT SESSION: LE strengthening, balance training, low back and hip stretches .     Haylee Mcanany,ANGIE, PTA 04/14/2024, 11:51 AM

## 2024-04-15 ENCOUNTER — Emergency Department (HOSPITAL_COMMUNITY)
Admission: EM | Admit: 2024-04-15 | Discharge: 2024-04-15 | Disposition: A | Discharge status: Home / Self Care | Attending: Emergency Medicine | Admitting: Emergency Medicine

## 2024-04-15 ENCOUNTER — Other Ambulatory Visit: Payer: Self-pay

## 2024-04-15 ENCOUNTER — Telehealth: Payer: Self-pay | Admitting: Cardiology

## 2024-04-15 ENCOUNTER — Emergency Department (HOSPITAL_COMMUNITY)

## 2024-04-15 DIAGNOSIS — N2581 Secondary hyperparathyroidism of renal origin: Secondary | ICD-10-CM | POA: Diagnosis not present

## 2024-04-15 DIAGNOSIS — Z7951 Long term (current) use of inhaled steroids: Secondary | ICD-10-CM | POA: Diagnosis not present

## 2024-04-15 DIAGNOSIS — I700 Atherosclerosis of aorta: Secondary | ICD-10-CM | POA: Diagnosis not present

## 2024-04-15 DIAGNOSIS — J45909 Unspecified asthma, uncomplicated: Secondary | ICD-10-CM | POA: Diagnosis not present

## 2024-04-15 DIAGNOSIS — G4489 Other headache syndrome: Secondary | ICD-10-CM | POA: Diagnosis not present

## 2024-04-15 DIAGNOSIS — Z7901 Long term (current) use of anticoagulants: Secondary | ICD-10-CM | POA: Diagnosis not present

## 2024-04-15 DIAGNOSIS — I132 Hypertensive heart and chronic kidney disease with heart failure and with stage 5 chronic kidney disease, or end stage renal disease: Secondary | ICD-10-CM | POA: Insufficient documentation

## 2024-04-15 DIAGNOSIS — I959 Hypotension, unspecified: Secondary | ICD-10-CM

## 2024-04-15 DIAGNOSIS — N186 End stage renal disease: Secondary | ICD-10-CM | POA: Insufficient documentation

## 2024-04-15 DIAGNOSIS — I9589 Other hypotension: Secondary | ICD-10-CM | POA: Insufficient documentation

## 2024-04-15 DIAGNOSIS — R55 Syncope and collapse: Secondary | ICD-10-CM | POA: Diagnosis present

## 2024-04-15 DIAGNOSIS — Z992 Dependence on renal dialysis: Secondary | ICD-10-CM | POA: Insufficient documentation

## 2024-04-15 DIAGNOSIS — I5032 Chronic diastolic (congestive) heart failure: Secondary | ICD-10-CM | POA: Diagnosis not present

## 2024-04-15 DIAGNOSIS — E1122 Type 2 diabetes mellitus with diabetic chronic kidney disease: Secondary | ICD-10-CM | POA: Diagnosis not present

## 2024-04-15 DIAGNOSIS — Z7984 Long term (current) use of oral hypoglycemic drugs: Secondary | ICD-10-CM | POA: Insufficient documentation

## 2024-04-15 DIAGNOSIS — R531 Weakness: Secondary | ICD-10-CM | POA: Diagnosis not present

## 2024-04-15 DIAGNOSIS — I120 Hypertensive chronic kidney disease with stage 5 chronic kidney disease or end stage renal disease: Secondary | ICD-10-CM | POA: Diagnosis not present

## 2024-04-15 DIAGNOSIS — Z79899 Other long term (current) drug therapy: Secondary | ICD-10-CM | POA: Diagnosis not present

## 2024-04-15 DIAGNOSIS — Z91010 Allergy to peanuts: Secondary | ICD-10-CM | POA: Diagnosis not present

## 2024-04-15 DIAGNOSIS — R059 Cough, unspecified: Secondary | ICD-10-CM | POA: Diagnosis present

## 2024-04-15 LAB — CBC WITH DIFFERENTIAL/PLATELET
Abs Immature Granulocytes: 0.03 K/uL (ref 0.00–0.07)
Basophils Absolute: 0 K/uL (ref 0.0–0.1)
Basophils Relative: 0 %
Eosinophils Absolute: 0.2 K/uL (ref 0.0–0.5)
Eosinophils Relative: 3 %
HCT: 33.8 % — ABNORMAL LOW (ref 36.0–46.0)
Hemoglobin: 11.4 g/dL — ABNORMAL LOW (ref 12.0–15.0)
Immature Granulocytes: 1 %
Lymphocytes Relative: 20 %
Lymphs Abs: 1.2 K/uL (ref 0.7–4.0)
MCH: 31.1 pg (ref 26.0–34.0)
MCHC: 33.7 g/dL (ref 30.0–36.0)
MCV: 92.1 fL (ref 80.0–100.0)
Monocytes Absolute: 0.6 K/uL (ref 0.1–1.0)
Monocytes Relative: 10 %
Neutro Abs: 4.2 K/uL (ref 1.7–7.7)
Neutrophils Relative %: 66 %
Platelets: 153 K/uL (ref 150–400)
RBC: 3.67 MIL/uL — ABNORMAL LOW (ref 3.87–5.11)
RDW: 15.6 % — ABNORMAL HIGH (ref 11.5–15.5)
WBC: 6.3 K/uL (ref 4.0–10.5)
nRBC: 0 % (ref 0.0–0.2)

## 2024-04-15 LAB — BASIC METABOLIC PANEL WITH GFR
Anion gap: 13 (ref 5–15)
BUN: 31 mg/dL — ABNORMAL HIGH (ref 8–23)
CO2: 27 mmol/L (ref 22–32)
Calcium: 8.3 mg/dL — ABNORMAL LOW (ref 8.9–10.3)
Chloride: 92 mmol/L — ABNORMAL LOW (ref 98–111)
Creatinine, Ser: 5.03 mg/dL — ABNORMAL HIGH (ref 0.44–1.00)
GFR, Estimated: 8 mL/min — ABNORMAL LOW (ref >60)
Glucose, Bld: 202 mg/dL — ABNORMAL HIGH (ref 70–99)
Potassium: 4 mmol/L (ref 3.5–5.1)
Sodium: 132 mmol/L — ABNORMAL LOW (ref 135–145)

## 2024-04-15 LAB — RESP PANEL BY RT-PCR (RSV, FLU A&B, COVID)  RVPGX2
Influenza A by PCR: NEGATIVE
Influenza B by PCR: NEGATIVE
Resp Syncytial Virus by PCR: NEGATIVE
SARS Coronavirus 2 by RT PCR: NEGATIVE

## 2024-04-15 MED ORDER — MIDODRINE HCL 5 MG PO TABS
10.0000 mg | ORAL_TABLET | Freq: Once | ORAL | Status: AC
  Administered 2024-04-15: 10 mg via ORAL
  Filled 2024-04-15: qty 2

## 2024-04-15 NOTE — Telephone Encounter (Signed)
 Spoke with Ndeya (dpr) Asking if we can get bp readings from dialysis clinic-the patient is there every Monday, Wednesday, and Friday. The dialysis clinic said they would need something faxed to them from us  in able to give this information.   She is requesting that we send them a fax requesting Bp's since May and once we receive them, she would like for us  to mail a copy to her. Address on file.    [facility] [address] [place], [place]  [address]  [phone] [facility] Phone)  [phone] [facility] Fax)

## 2024-04-15 NOTE — ED Triage Notes (Signed)
 PT BIB [facility] from home, has dialysis MWF BP was low before discharge dialysis gave 1 10mg  midadrine at 1300 BP . Upon EMS arrival BP 88/40 250 fluids were given on PIV 22G left wrist BP 130/52 after fluids. Pt family informed EMS family member has been sick and now pt has a cough. Pt is on 2L 02 baseline CBG 299 pt is a diabetic.

## 2024-04-15 NOTE — ED Provider Notes (Signed)
 [place] EMERGENCY DEPARTMENT AT [facility] Provider Note  CSN: [id number] Arrival date & time: 04/15/24 1830  Chief Complaint(s) Hypotension, dialysis pt  HPI Paula [name]  is a 78 y.o. female with past medical history as below, significant for ESRD on HD MWF, asthma, DM, diastolic HF who presents to the ED with complaint of hypotension  Hypotensive at HD, did not get midodrine , felt light headed, near syncope, improved after fluids w/ ems. Pt reports similar issues in the past with HD, thinks they may have taken too much fluid at HD. She o/w has no acute complaints, feeling better after fluids PTA  Past Medical History Past Medical History:  Diagnosis Date   Allergic rhinitis    Allergy  1/12000   Arthritis    Asthma    Brain aneurysm    Cough    Diabetes mellitus    Diastolic CHF, chronic (HCC) 10/11/2011   ESRD on hemodialysis (HCC)    MWF   GERD (gastroesophageal reflux disease)    History of colon polyps 2012   tubular adenoma    Hyperlipidemia    Hypertension    Neuropathy 10/11/2011   PONV (postoperative nausea and vomiting)    one time after lymph node surgery   Patient Active Problem List   Diagnosis Date Noted   Hypoxemia 12/29/2023   Abnormal electrocardiogram (ECG) (EKG) 12/29/2023   Left ventricular hypertrophy 12/29/2023   CHF (congestive heart failure) (HCC) 10/12/2023   Blurry vision 10/11/2023   Anemia due to chronic kidney disease 10/11/2023   Acute respiratory failure with hypoxia (HCC) 08/17/2023   PNA (pneumonia) 08/17/2023   Volume overload 08/17/2023   Acute hypoxic respiratory failure (HCC) 08/17/2023   SNHL (sensory-neural hearing loss), 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   ESRD on dialysis (HCC) 11/20/2022   Mold exposure 08/14/2022   Opioid withdrawal (HCC) 08/14/2022   ILD (interstitial lung disease) (HCC) 08/14/2022   Secondary hyperparathyroidism, renal (HCC) 08/14/2022   Paroxysmal A-fib (HCC) 05/20/2022   Hypertrophy of  nasal turbinates 09/05/2021   Chronic kidney disease, stage 5 (HCC)    Aortic atherosclerosis (HCC) 06/24/2021   Chronic rhinitis 10/01/2017   Seasonal allergic conjunctivitis 10/01/2017   Aneurysm (HCC) 09/24/2017   Acute metabolic encephalopathy 09/02/2017   Allergic rhinitis 08/20/2017   Palpitations 07/10/2016   Gout 01/23/2016   History of cataract 02/27/2015   Seasonal and perennial allergic rhinitis 02/15/2012   Chronic diastolic CHF (congestive heart failure) (HCC) 10/11/2011   GERD (gastroesophageal reflux disease)    Type 2 diabetes mellitus without complication, without long-term current use of insulin  (HCC) 07/03/2010   Hyperlipidemia 07/03/2010   Essential hypertension 07/03/2010   Home Medication(s) Prior to Admission medications   Medication Sig Start Date End Date Taking? Authorizing Provider  ACCU-CHEK GUIDE TEST test strip USE 1 STRIP TO CHECK GLUCOSE TWICE DAILY 04/08/24   Newlin, Enobong, MD  acetaminophen  (TYLENOL ) 500 MG tablet Take 500 mg by mouth every 6 (six) hours as needed for fever, moderate pain or mild pain.    [provider]  albuterol  (PROAIR  HFA) 108 (90 Base) MCG/ACT inhaler Inhale 1-2 puffs into the lungs every 6 (six) hours as needed for wheezing or shortness of breath. 10/29/23   Danton Jon HERO, PA-C  albuterol  (PROVENTIL ) (2.5 MG/3ML) 0.083% nebulizer solution Take 3 mLs (2.5 mg total) by nebulization every 6 (six) hours as needed for wheezing or shortness of breath. 11/04/23   Delbert Clam, MD  apixaban  (ELIQUIS ) 5 MG TABS tablet Take 1 tablet (5 mg  total) by mouth 2 (two) times daily. 11/04/23   Newlin, Enobong, MD  atorvastatin  (LIPITOR ) 80 MG tablet Take 1 tablet (80 mg total) by mouth daily. 03/01/24   Newlin, Enobong, MD  azelastine  (ASTELIN ) 0.1 % nasal spray Place 2 sprays into both nostrils 2 (two) times daily. Use in each nostril as directed 01/20/24   Newlin, Enobong, MD  benzonatate  (TESSALON ) 100 MG capsule Take 1 capsule by  mouth three times a day as need for cough 11/25/23     calcium  acetate (PHOSLO ) 667 MG capsule Take 1 capsule (667 mg total) by mouth 3 (three) times daily with meals. 07/17/23     carvedilol  (COREG ) 3.125 MG tablet Take 1 tablet (3.125 mg total) by mouth 2 (two) times daily. 04/05/24 07/04/24  Sheron Lorette GRADE, PA-C  fluticasone  (FLONASE ) 50 MCG/ACT nasal spray Place 2 sprays into both nostrils daily. 01/20/24   Newlin, Enobong, MD  fluticasone -salmeterol (ADVAIR ) 100-50 MCG/ACT AEPB Inhale 1 puff into the lungs 2 (two) times daily. 11/26/23 04/07/24  Newlin, Enobong, MD  gabapentin  (NEURONTIN ) 100 MG capsule Take 1 capsule (100 mg total) by mouth 3 (three) times daily. 11/04/23   Newlin, Enobong, MD  glipiZIDE  (GLUCOTROL  XL) 2.5 MG 24 hr tablet Take 1 tablet (2.5 mg total) by mouth daily as needed (for elevated BGL). 11/04/23   Newlin, Enobong, MD  Homeopathic Products (EARACHE DROPS OT) Place 1 drop into both ears daily as needed (for ear aches).    [provider]  isosorbide  mononitrate (IMDUR ) 30 MG 24 hr tablet Take 1 tablet (30 mg total) by mouth at bedtime. Do not take on dialysis days. 04/07/24   Sheron Lorette GRADE, PA-C  lidocaine -prilocaine  (EMLA ) cream Apply a small amount to dialysis access one hour prior to dialysis. Wrap with plastic wrap. 12/10/21     linagliptin  (TRADJENTA ) 5 MG TABS tablet Take 1 tablet (5 mg total) by mouth daily. 08/25/23   Thapa, [place], MD  loratadine  (CLARITIN ) 10 MG tablet Take 1 tablet (10 mg total) by mouth daily. 03/07/24   Newlin, Enobong, MD  midodrine  (PROAMATINE ) 10 MG tablet Take 1 tablet (10 mg total) by mouth 3 (three) times a week. Take 30 minutes prior to HD Patient taking differently: Take 10 mg by mouth See admin instructions. Take 10 mg by mouth prior to dialysis only if blood pressure is low 07/17/23     Misc. Devices [employer]  Diagnosis - unstable gait 03/05/21   Delbert Clam, MD  Misc. Devices MISC Fistula sleeve for R arm.  Diagnoses:  End-stage renal disease on hemodialysis 01/09/22   Delbert Clam, MD  Misc. Devices MISC 1. Incontinence supplies.  2. Briefs.  Diagnosis-urinary incontinence 04/17/22   Delbert Clam, MD  Misc. Devices MISC Nepro dialysis protein drink.  Diagnosis end-stage renal disease Patient taking differently: Take 10 g by mouth See admin instructions. Nepro dialysis protein drink (Diagnosis end-stage renal disease)- Mix 10 grams of powder and mix into food daily as needed for low protein 05/16/22   Delbert Clam, MD  Misc. Devices MISC Blood pressure monitor.  Diagnosis hypertension 05/20/22   Delbert Clam, MD  Misc. Devices MISC Portable oxygen concentrator, 2 L of oxygen.  Diagnosis chronic respiratory failure. 12/31/23   Newlin, Enobong, MD  montelukast  (SINGULAIR ) 10 MG tablet Take 1 tablet (10 mg total) by mouth at bedtime. 11/26/23   Newlin, Enobong, MD  olopatadine  (PATANOL) 0.1 % ophthalmic solution Place 1 drop into both eyes 2 (two) times daily. Patient taking differently:  Place 1 drop into both eyes daily. 08/14/22   Brien Belvie BRAVO, MD  ondansetron  (ZOFRAN ) 4 MG tablet Take 4 mg by mouth every 8 (eight) hours as needed for nausea or vomiting.    [provider]  Throat Lozenges (HALLS COUGH DROPS MT) Use as directed 1 lozenge in the mouth or throat every 2 (two) hours as needed (for coughing).    [provider]  tiZANidine  (ZANAFLEX ) 4 MG tablet Take 1 tablet (4 mg total) by mouth every 8 (eight) hours as needed for muscle spasms. 04/07/24   Delbert Clam, MD                                                                                                                                    Past Surgical History Past Surgical History:  Procedure Laterality Date   AV FISTULA PLACEMENT Right 10/15/2021   Procedure: ARTERIOVENOUS (AV) FISTULA  WITH PLACEMENT OF GORE-TEX STRETCH  GRAFT (4-7mmx45cm);  Surgeon: Eliza Lonni RAMAN, MD;  Location: [facility] OR;  Service: Vascular;   Laterality: Right;   CATARACT EXTRACTION     right eye   IR 3D INDEPENDENT WKST  12/14/2017   IR ANGIO INTRA EXTRACRAN SEL COM CAROTID INNOMINATE BILAT MOD SED  09/15/2017   IR ANGIO INTRA EXTRACRAN SEL COM CAROTID INNOMINATE UNI L MOD SED  12/14/2017   IR ANGIO VERTEBRAL SEL VERTEBRAL BILAT MOD SED  09/15/2017   IR RADIOLOGIST EVAL & MGMT  09/10/2017   IR RADIOLOGIST EVAL & MGMT  10/19/2017   lymphatic mass surgery     NASAL TURBINATE REDUCTION     RADIOLOGY WITH ANESTHESIA N/A 12/14/2017   Procedure: RADIOLOGY WITH ANESTHESIA EMBOLIZATION;  Surgeon: Dolphus Carrion, MD;  Location: [facility] OR;  Service: Radiology;  Laterality: N/A;   VIDEO BRONCHOSCOPY Bilateral 11/18/2017   Procedure: VIDEO BRONCHOSCOPY WITHOUT FLUORO;  Surgeon: Shelah Lamar RAMAN, MD;  Location: [facility] ENDOSCOPY;  Service: Cardiopulmonary;  Laterality: Bilateral;   Family History Family History  Problem Relation Age of Onset   Hypertension Mother    Asthma Sister    Colon cancer Neg Hx    Allergic rhinitis Neg Hx    Eczema Neg Hx     Social History Social History   Tobacco Use   Smoking status: Never    Passive exposure: Never   Smokeless tobacco: Never  Vaping Use   Vaping status: Never Used  Substance Use Topics   Alcohol  use: No   Drug use: No   Allergies Dust mite extract, Peanut-containing drug products, Ace inhibitors, Insulins, and Lovastatin  Review of Systems A thorough review of systems was obtained and all systems are negative except as noted in the HPI and PMH.   Physical Exam Vital Signs  I have reviewed the triage vital signs BP (!) 134/58   Pulse 87   Temp 98.5 F (36.9 C)   Resp 16   LMP  (LMP Unknown)  SpO2 99%  Physical Exam Vitals and nursing note reviewed.  Constitutional:      General: She is not in acute distress.    Appearance: Normal appearance. She is well-developed. She is not ill-appearing.  HENT:     Head: Normocephalic and atraumatic.     Right Ear: External ear  normal.     Left Ear: External ear normal.     Nose: Nose normal.     Mouth/Throat:     Mouth: Mucous membranes are moist.  Eyes:     General: No scleral icterus.       Right eye: No discharge.        Left eye: No discharge.  Cardiovascular:     Rate and Rhythm: Normal rate.  Pulmonary:     Effort: Pulmonary effort is normal. No respiratory distress.     Breath sounds: No stridor.  Abdominal:     General: Abdomen is flat. There is no distension.     Tenderness: There is no guarding.  Musculoskeletal:        General: No deformity.     Cervical back: No rigidity.  Skin:    General: Skin is warm and dry.     Coloration: Skin is not cyanotic, jaundiced or pale.      Neurological:     Mental Status: She is alert and oriented to person, place, and time.     GCS: GCS eye subscore is 4. GCS verbal subscore is 5. GCS motor subscore is 6.  Psychiatric:        Speech: Speech normal.        Behavior: Behavior normal. Behavior is cooperative.     ED Results and Treatments Labs (all labs ordered are listed, but only abnormal results are displayed) Labs Reviewed  CBC WITH DIFFERENTIAL/PLATELET - Abnormal; Notable for the following components:      Result Value   RBC 3.67 (*)    Hemoglobin 11.4 (*)    HCT 33.8 (*)    RDW 15.6 (*)    All other components within normal limits  BASIC METABOLIC PANEL WITH GFR - Abnormal; Notable for the following components:   Sodium 132 (*)    Chloride 92 (*)    Glucose, Bld 202 (*)    BUN 31 (*)    Creatinine, Ser 5.03 (*)    Calcium  8.3 (*)    GFR, Estimated 8 (*)    All other components within normal limits  RESP PANEL BY RT-PCR (RSV, FLU A&B, COVID)  RVPGX2  URINALYSIS, ROUTINE W REFLEX MICROSCOPIC                                                                                                                          Radiology DG Chest 1 View Result Date: 04/15/2024 CLINICAL DATA:  Cough hypotension EXAM: CHEST  1 VIEW COMPARISON:   03/29/2024 FINDINGS: The heart size and mediastinal contours are within normal limits. Aortic atherosclerosis. Both lungs are clear. The  visualized skeletal structures are unremarkable. IMPRESSION: No active disease. Electronically Signed   By: Luke Bun M.D.   On: 04/15/2024 19:55    Pertinent labs & imaging results that were available during my care of the patient were reviewed by me and considered in my medical decision making (see MDM for details).  Medications Ordered in ED Medications  midodrine  (PROAMATINE ) tablet 10 mg (10 mg Oral Given 04/15/24 1947)                                                                                                                                     Procedures Procedures  (including critical care time)  Medical Decision Making / ED Course    Medical Decision Making:    Paula [name]  is a 78 y.o. female with past medical history as below, significant for ESRD on HD MWF, asthma, DM, diastolic HF who presents to the ED with complaint of hypotension. The complaint involves an extensive differential diagnosis and also carries with it a high risk of complications and morbidity.  Serious etiology was considered. Ddx includes but is not limited to: dehydration, hypovolemia, metabolic derangement, missed midodrine , etc  Complete initial physical exam performed, notably the patient was in NAD.    Reviewed and confirmed nursing documentation for past medical history, family history, social history.  Vital signs reviewed.    Transient hypotension Missed midodrine  > - pt with transient hypotension at HD that has since improved - pt was symptomatic at that time, was light headed, thought she was going to pass out. Improved with IVF and midodrine , feels back to baseline - labs here stable, CXR stable, she has been HDS while in the ED - BP improved, given midodrine  here, stressed importance of taking her midodrine  in conjunction with HD - d/w family at  bedside, plan for dc home, f/u pcp  Patient in no distress and overall condition is stable. Detailed discussions were had with the patient/guardian regarding current findings, and need for close f/u with PCP or on call doctor. The patient/guardian has been instructed to return immediately if the symptoms worsen in any way for re-evaluation. Patient/guardian verbalized understanding and is in agreement with current care plan. All questions answered prior to discharge.                       Additional history obtained: -Additional history obtained from family -External records from outside source obtained and reviewed including: Chart review including previous notes, labs, imaging, consultation notes including  Home meds   Lab Tests: -I ordered, reviewed, and interpreted labs.   The pertinent results include:   Labs Reviewed  CBC WITH DIFFERENTIAL/PLATELET - Abnormal; Notable for the following components:      Result Value   RBC 3.67 (*)    Hemoglobin 11.4 (*)    HCT 33.8 (*)    RDW 15.6 (*)    All  other components within normal limits  BASIC METABOLIC PANEL WITH GFR - Abnormal; Notable for the following components:   Sodium 132 (*)    Chloride 92 (*)    Glucose, Bld 202 (*)    BUN 31 (*)    Creatinine, Ser 5.03 (*)    Calcium  8.3 (*)    GFR, Estimated 8 (*)    All other components within normal limits  RESP PANEL BY RT-PCR (RSV, FLU A&B, COVID)  RVPGX2  URINALYSIS, ROUTINE W REFLEX MICROSCOPIC    Notable for stable  EKG   EKG Interpretation Date/Time:    Ventricular Rate:    PR Interval:    QRS Duration:    QT Interval:    QTC Calculation:   R Axis:      Text Interpretation:           Imaging Studies ordered: I ordered imaging studies including cxr I independently visualized the following imaging with scope of interpretation limited to determining acute life threatening conditions related to emergency care; findings noted above I agree with  the radiologist interpretation If any imaging was obtained with contrast I closely monitored patient for any possible adverse reaction a/w contrast administration in the emergency department   Medicines ordered and prescription drug management: Meds ordered this encounter  Medications   midodrine  (PROAMATINE ) tablet 10 mg    -I have reviewed the patients home medicines and have made adjustments as needed   Consultations Obtained: na   Cardiac Monitoring: Continuous pulse oximetry interpreted by myself, 100% on 2LNC (baseline).    Social Determinants of Health:  Diagnosis or treatment significantly limited by social determinants of health: esrd on hd   Reevaluation: After the interventions noted above, I reevaluated the patient and found that they have resolved  Co morbidities that complicate the patient evaluation  Past Medical History:  Diagnosis Date   Allergic rhinitis    Allergy  1/12000   Arthritis    Asthma    Brain aneurysm    Cough    Diabetes mellitus    Diastolic CHF, chronic (HCC) 10/11/2011   ESRD on hemodialysis (HCC)    MWF   GERD (gastroesophageal reflux disease)    History of colon polyps 2012   tubular adenoma    Hyperlipidemia    Hypertension    Neuropathy 10/11/2011   PONV (postoperative nausea and vomiting)    one time after lymph node surgery      Dispostion: Disposition decision including need for hospitalization was considered, and patient discharged from emergency department.    Final Clinical Impression(s) / ED Diagnoses Final diagnoses:  Transient hypotension  ESRD on hemodialysis (HCC)        Elnor Jayson LABOR, DO 04/16/24 0001

## 2024-04-15 NOTE — ED Notes (Signed)
 The patient is A&OX4,wheeled out of ED via wheelchair, NAD. Pt and her daughter verbalized understanding of d/c instructions and follow up care.

## 2024-04-15 NOTE — Telephone Encounter (Signed)
 Pt's daughter calling to ask if office can request BP readings from dialysis clinic. Please advise

## 2024-04-15 NOTE — Telephone Encounter (Signed)
 Copied from CRM [id number]. Topic: Clinical - Medical Advice >> Apr 15, 2024  2:53 PM Myrick T wrote: Reason for CRM: patients daughter called stated she would like her mothers blood pressure readings while she was at dialysis today but they said [facility] would need to request the info. Daughter said she is suppose to be keeping up with her blood pressure numbers for 2 weeks. Please contact [facility] [phone]. Adrien is the Financial controller. Send the request to [phone].

## 2024-04-15 NOTE — Discharge Instructions (Signed)
 It was a pleasure caring for you today in the emergency department.  Your symptoms today were likely secondary to your blood pressure dropping during dialysis, perhaps too much fluid was removed. This has improved. Please take your midodrine  as prescribed and be sure to drink liquids throughout the day. Please follow up with your PCP for ongoing care  Please return to the emergency department for any worsening or worrisome symptoms.

## 2024-04-18 DIAGNOSIS — N2581 Secondary hyperparathyroidism of renal origin: Secondary | ICD-10-CM | POA: Diagnosis not present

## 2024-04-18 DIAGNOSIS — N186 End stage renal disease: Secondary | ICD-10-CM | POA: Diagnosis not present

## 2024-04-18 DIAGNOSIS — Z992 Dependence on renal dialysis: Secondary | ICD-10-CM | POA: Diagnosis not present

## 2024-04-18 NOTE — Telephone Encounter (Signed)
 Patient has sent MyChart message also

## 2024-04-20 DIAGNOSIS — N2581 Secondary hyperparathyroidism of renal origin: Secondary | ICD-10-CM | POA: Diagnosis not present

## 2024-04-20 DIAGNOSIS — N186 End stage renal disease: Secondary | ICD-10-CM | POA: Diagnosis not present

## 2024-04-20 DIAGNOSIS — Z992 Dependence on renal dialysis: Secondary | ICD-10-CM | POA: Diagnosis not present

## 2024-04-22 DIAGNOSIS — Z992 Dependence on renal dialysis: Secondary | ICD-10-CM | POA: Diagnosis not present

## 2024-04-22 DIAGNOSIS — N186 End stage renal disease: Secondary | ICD-10-CM | POA: Diagnosis not present

## 2024-04-22 DIAGNOSIS — N2581 Secondary hyperparathyroidism of renal origin: Secondary | ICD-10-CM | POA: Diagnosis not present

## 2024-04-25 ENCOUNTER — Other Ambulatory Visit (HOSPITAL_COMMUNITY): Payer: Self-pay

## 2024-04-25 ENCOUNTER — Other Ambulatory Visit: Payer: Self-pay

## 2024-04-25 ENCOUNTER — Other Ambulatory Visit: Payer: Self-pay | Admitting: Family Medicine

## 2024-04-25 DIAGNOSIS — N186 End stage renal disease: Secondary | ICD-10-CM | POA: Diagnosis not present

## 2024-04-25 DIAGNOSIS — N2581 Secondary hyperparathyroidism of renal origin: Secondary | ICD-10-CM | POA: Diagnosis not present

## 2024-04-25 DIAGNOSIS — Z992 Dependence on renal dialysis: Secondary | ICD-10-CM | POA: Diagnosis not present

## 2024-04-26 ENCOUNTER — Other Ambulatory Visit: Payer: Self-pay

## 2024-04-26 ENCOUNTER — Other Ambulatory Visit (HOSPITAL_COMMUNITY): Payer: Self-pay

## 2024-04-26 MED ORDER — FLUTICASONE-SALMETEROL 100-50 MCG/ACT IN AEPB
1.0000 | INHALATION_SPRAY | Freq: Two times a day (BID) | RESPIRATORY_TRACT | 2 refills | Status: DC
  Filled 2024-04-26: qty 60, 30d supply, fill #0
  Filled 2024-06-02: qty 60, 30d supply, fill #1

## 2024-04-27 DIAGNOSIS — N186 End stage renal disease: Secondary | ICD-10-CM | POA: Diagnosis not present

## 2024-04-27 DIAGNOSIS — Z992 Dependence on renal dialysis: Secondary | ICD-10-CM | POA: Diagnosis not present

## 2024-04-27 DIAGNOSIS — N2581 Secondary hyperparathyroidism of renal origin: Secondary | ICD-10-CM | POA: Diagnosis not present

## 2024-04-29 DIAGNOSIS — N2581 Secondary hyperparathyroidism of renal origin: Secondary | ICD-10-CM | POA: Diagnosis not present

## 2024-04-29 DIAGNOSIS — Z992 Dependence on renal dialysis: Secondary | ICD-10-CM | POA: Diagnosis not present

## 2024-04-29 DIAGNOSIS — N186 End stage renal disease: Secondary | ICD-10-CM | POA: Diagnosis not present

## 2024-05-02 DIAGNOSIS — N186 End stage renal disease: Secondary | ICD-10-CM | POA: Diagnosis not present

## 2024-05-02 DIAGNOSIS — N2581 Secondary hyperparathyroidism of renal origin: Secondary | ICD-10-CM | POA: Diagnosis not present

## 2024-05-02 DIAGNOSIS — Z992 Dependence on renal dialysis: Secondary | ICD-10-CM | POA: Diagnosis not present

## 2024-05-02 NOTE — Telephone Encounter (Signed)
 Called [facility] regarding the pt's blood pressure readings. Readings from May will be faxed to us  and put in the chart.

## 2024-05-03 ENCOUNTER — Ambulatory Visit: Payer: Self-pay | Admitting: Endocrinology

## 2024-05-03 ENCOUNTER — Ambulatory Visit: Admitting: Endocrinology

## 2024-05-03 ENCOUNTER — Encounter: Payer: Self-pay | Admitting: Endocrinology

## 2024-05-03 ENCOUNTER — Other Ambulatory Visit (HOSPITAL_COMMUNITY): Payer: Self-pay

## 2024-05-03 DIAGNOSIS — E1165 Type 2 diabetes mellitus with hyperglycemia: Secondary | ICD-10-CM | POA: Diagnosis not present

## 2024-05-03 DIAGNOSIS — E1169 Type 2 diabetes mellitus with other specified complication: Secondary | ICD-10-CM

## 2024-05-03 LAB — GLUCOSE, POCT (MANUAL RESULT ENTRY): POC Glucose: 110 mg/dL — AB (ref 70–99)

## 2024-05-03 MED ORDER — GLIPIZIDE ER 2.5 MG PO TB24
2.5000 mg | ORAL_TABLET | Freq: Every day | ORAL | 3 refills | Status: AC
  Filled 2024-05-03 – 2024-07-19 (×5): qty 90, 90d supply, fill #0
  Filled 2024-08-05: qty 90, 90d supply, fill #1

## 2024-05-03 MED ORDER — LINAGLIPTIN 5 MG PO TABS
5.0000 mg | ORAL_TABLET | Freq: Every day | ORAL | 3 refills | Status: AC
  Filled 2024-05-03 – 2024-05-05 (×2): qty 90, 90d supply, fill #0
  Filled 2024-06-02 – 2024-07-19 (×3): qty 90, 90d supply, fill #1
  Filled 2024-08-05 – 2024-09-03 (×2): qty 90, 90d supply, fill #2

## 2024-05-03 NOTE — Progress Notes (Unsigned)
 Patient presents complaining of feeling lightheaded. She stated she though her BP was low however it is 128/50 after manually checking. Patient did have low O2 Sats at 87-88% prior to having patient take deep breaths, for which it increased at that time to  91%. Per family member that is with patient, patient looked like she was passed out on the ride over. She also explained that patient uses O2 as needed but since she is feeling this way it may be helpful to bring it with her each time she leaves home. Per family member she has been needing it lately. MD made aware.

## 2024-05-03 NOTE — Progress Notes (Unsigned)
 Outpatient Endocrinology Note [place] Helaine Yackel, MD   Patient's Name: Paula [name] Transue     DOB: [birth year]    MRN: [id number]                                                    REASON OF VISIT: New consult / Follow up for type 2 diabetes mellitus  REFERRING PROVIDER: Delbert Clam, MD  PCP: Delbert Clam, MD  HISTORY OF PRESENT ILLNESS:   Hila Bolding Ordaz  is a 78 y.o. old female with past medical history listed below, is here for new consult for type 2 diabetes mellitus.   Pertinent Diabetes History: Patient was diagnosed with type 2 diabetes mellitus in early 2000.  Patient has uncontrolled type 2 diabetes mellitus.  Referred and presented for evaluation and management to endocrinology.  Patient is accompanied by daughter.  Patient has ESRD on hemodialysis.  Patient used to be on metformin  which was later stopped due to renal insufficiency.  Patient has been on glipizide , used to be on higher dose was gradually reduced due to hypoglycemia.  Patient reports she has never been on insulin  therapy at home.  She had insulin  while hospitalized and had bad experience with feeling quite weak and preferred not to be on insulin  therapy.  Chronic Diabetes Complications : Retinopathy: no. Last ophthalmology exam was done on annually, reportedly, following with ophthalmology regularly.  Nephropathy: ESRD, on dialysis. Peripheral neuropathy: yes, feet pain.  Coronary artery disease: no Stroke: no  Relevant comorbidities and cardiovascular risk factors: Obesity: no Body mass index is 30.33 kg/m.  Hypertension: Yes  Hyperlipidemia : Yes, on statin   Current / Home Diabetic regimen includes:  Glipizide  XR 2.5 mg daily. Rjdenda 5mg  daily Prior diabetic medications: Metformin  in the past, does not recall taking any other medications.  Glycemic data:   300-400s range. This morning 196. Was on presdnosone for breathing problem.  She forgot to bring glucometer.  She checks blood sugar at home  1-2 times a day.  Average 115, low 74 lowest,   Hypoglycemia: Patient has no hypoglycemic episodes. Patient has hypoglycemia awareness.  Factors modifying glucose control: 1.  Diabetic diet assessment: 3 meals a day.  Not much eating on the day of dialysis.  2.  Staying active or exercising: No formal exercise.  Limited physical activities.  3.  Medication compliance: compliant all of the time.  Interval history  Patient was on prednisone  completed on last Sunday for breathing problem.  Patient had hyperglycemia with blood sugar significantly high in the range of 400s however slowly trending down after completing prednisone .  This morning blood sugar was 196.  Patient reports she had blood sugar more than 200 prior to being on prednisone  as well.  Patient was taking 2 tablets of glipizide  total 5 mg daily when having hyperglycemia.  Recent hemoglobin A1c 7.6%.  No glucose data to review.  REVIEW OF SYSTEMS As per history of present illness.   PAST MEDICAL HISTORY: Past Medical History:  Diagnosis Date   Allergic rhinitis    Allergy  1/12000   Arthritis    Asthma    Brain aneurysm    Cough    Diabetes mellitus    Diastolic CHF, chronic (HCC) 10/11/2011   ESRD on hemodialysis (HCC)    MWF   GERD (gastroesophageal reflux disease)    History of  colon polyps 2012   tubular adenoma    Hyperlipidemia    Hypertension    Neuropathy 10/11/2011   PONV (postoperative nausea and vomiting)    one time after lymph node surgery    PAST SURGICAL HISTORY: Past Surgical History:  Procedure Laterality Date   AV FISTULA PLACEMENT Right 10/15/2021   Procedure: ARTERIOVENOUS (AV) FISTULA  WITH PLACEMENT OF GORE-TEX STRETCH  GRAFT (4-59mmx45cm);  Surgeon: Eliza Lonni RAMAN, MD;  Location[facility];  Service: Vascular;  Laterality: Right;   CATARACT EXTRACTION     right eye   IR 3D INDEPENDENT WKST  12/14/2017   IR ANGIO INTRA EXTRACRAN SEL COM CAROTID INNOMINATE BILAT MOD SED  09/15/2017    IR ANGIO INTRA EXTRACRAN SEL COM CAROTID INNOMINATE UNI L MOD SED  12/14/2017   IR ANGIO VERTEBRAL SEL VERTEBRAL BILAT MOD SED  09/15/2017   IR RADIOLOGIST EVAL & MGMT  09/10/2017   IR RADIOLOGIST EVAL & MGMT  10/19/2017   lymphatic mass surgery     NASAL TURBINATE REDUCTION     RADIOLOGY WITH ANESTHESIA N/A 12/14/2017   Procedure: RADIOLOGY WITH ANESTHESIA EMBOLIZATION;  Surgeon: Dolphus Carrion, MD;  Location: [facility] OR;  Service: Radiology;  Laterality: N/A;   VIDEO BRONCHOSCOPY Bilateral 11/18/2017   Procedure: VIDEO BRONCHOSCOPY WITHOUT FLUORO;  Surgeon: Shelah Lamar RAMAN, MD;  Location: [facility] ENDOSCOPY;  Service: Cardiopulmonary;  Laterality: Bilateral;    ALLERGIES: Allergies  Allergen Reactions   Dust Mite Extract Cough   Peanut-Containing Drug Products Cough   Ace Inhibitors Cough   Insulins     Reports fainting, intolerance   Lovastatin Other (See Comments)    Generalized body pain    FAMILY HISTORY:  Family History  Problem Relation Age of Onset   Hypertension Mother    Asthma Sister    Colon cancer Neg Hx    Allergic rhinitis Neg Hx    Eczema Neg Hx     SOCIAL HISTORY: Social History   Socioeconomic History   Marital status: Divorced    Spouse name: Not on file   Number of children: 5   Years of education: Not on file   Highest education level: Master's degree (e.g., MA, MS, MEng, MEd, MSW, MBA)  Occupational History   Occupation: Unemployed  Tobacco Use   Smoking status: Never    Passive exposure: Never   Smokeless tobacco: Never  Vaping Use   Vaping status: Never Used  Substance and Sexual Activity   Alcohol  use: No   Drug use: No   Sexual activity: Not Currently    Birth control/protection: Post-menopausal, None  Other Topics Concern   Not on file  Social History Narrative   Lives alone.  5 children with two deceased.     Social Drivers of Health   Financial Resource Strain: High Risk (03/01/2024)   Overall Financial Resource Strain (CARDIA)     Difficulty of Paying Living Expenses: Hard  Food Insecurity: Food Insecurity Present (03/01/2024)   Hunger Vital Sign    Worried About Running Out of Food in the Last Year: Often true    Ran Out of Food in the Last Year: Often true  Transportation Needs: Unmet Transportation Needs (03/01/2024)   PRAPARE - Administrator, Civil Service (Medical): Yes    Lack of Transportation (Non-Medical): Yes  Physical Activity: Insufficiently Active (03/01/2024)   Exercise Vital Sign    Days of Exercise per Week: 2 days    Minutes of Exercise per Session: 10 min  Stress: No Stress Concern Present (03/01/2024)   [employer] of Occupational Health - Occupational Stress Questionnaire    Feeling of Stress: Not at all  Social Connections: Unknown (03/01/2024)   Social Connection and Isolation Panel    Frequency of Communication with Friends and Family: More than three times a week    Frequency of Social Gatherings with Friends and Family: More than three times a week    Attends Religious Services: More than 4 times per year    Active Member of [employer] or Organizations: Patient declined    Attends Banker Meetings: Not on file    Marital Status: Widowed    MEDICATIONS:  Current Outpatient Medications  Medication Sig Dispense Refill   ACCU-CHEK GUIDE TEST test strip USE 1 STRIP TO CHECK GLUCOSE TWICE DAILY 100 each 0   acetaminophen  (TYLENOL ) 500 MG tablet Take 500 mg by mouth every 6 (six) hours as needed for fever, moderate pain or mild pain.     albuterol  (PROAIR  HFA) 108 (90 Base) MCG/ACT inhaler Inhale 1-2 puffs into the lungs every 6 (six) hours as needed for wheezing or shortness of breath. 6.7 g 2   albuterol  (PROVENTIL ) (2.5 MG/3ML) 0.083% nebulizer solution Take 3 mLs (2.5 mg total) by nebulization every 6 (six) hours as needed for wheezing or shortness of breath. 75 mL 1   apixaban  (ELIQUIS ) 5 MG TABS tablet Take 1 tablet (5 mg total) by mouth 2 (two) times daily. 180  tablet 1   atorvastatin  (LIPITOR ) 80 MG tablet Take 1 tablet (80 mg total) by mouth daily. 90 tablet 1   azelastine  (ASTELIN ) 0.1 % nasal spray Place 2 sprays into both nostrils 2 (two) times daily. Use in each nostril as directed 30 mL 12   calcium  acetate (PHOSLO ) 667 MG capsule Take 1 capsule (667 mg total) by mouth 3 (three) times daily with meals. 270 capsule 2   carvedilol  (COREG ) 3.125 MG tablet Take 1 tablet (3.125 mg total) by mouth 2 (two) times daily. 180 tablet 3   fluticasone  (FLONASE ) 50 MCG/ACT nasal spray Place 2 sprays into both nostrils daily. 16 g 12   fluticasone -salmeterol (ADVAIR ) 100-50 MCG/ACT AEPB Inhale 1 puff into the lungs 2 (two) times daily. 60 each 2   gabapentin  (NEURONTIN ) 100 MG capsule Take 1 capsule (100 mg total) by mouth 3 (three) times daily. 270 capsule 1   Homeopathic Products (EARACHE DROPS OT) Place 1 drop into both ears daily as needed (for ear aches).     isosorbide  mononitrate (IMDUR ) 30 MG 24 hr tablet Take 1 tablet (30 mg total) by mouth at bedtime. Do not take on dialysis days. 90 tablet 0   lidocaine -prilocaine  (EMLA ) cream Apply a small amount to dialysis access one hour prior to dialysis. Wrap with plastic wrap. 30 g PRN   loratadine  (CLARITIN ) 10 MG tablet Take 1 tablet (10 mg total) by mouth daily. 90 tablet 1   midodrine  (PROAMATINE ) 10 MG tablet Take 1 tablet (10 mg total) by mouth 3 (three) times a week. Take 30 minutes prior to HD (Patient taking differently: Take 10 mg by mouth See admin instructions. Take 10 mg by mouth prior to dialysis only if blood pressure is low) 15 tablet 3   Misc. Devices [employer]  Diagnosis - unstable gait 1 each 0   Misc. Devices MISC Blood pressure monitor.  Diagnosis hypertension 1 each 0   Misc. Devices MISC Portable oxygen concentrator, 2 L of oxygen.  Diagnosis chronic respiratory  failure. 1 each 0   olopatadine  (PATANOL) 0.1 % ophthalmic solution Place 1 drop into both eyes 2 (two) times daily. 5 mL 12    ondansetron  (ZOFRAN ) 4 MG tablet Take 4 mg by mouth every 8 (eight) hours as needed for nausea or vomiting.     Throat Lozenges (HALLS COUGH DROPS MT) Use as directed 1 lozenge in the mouth or throat every 2 (two) hours as needed (for coughing).     tiZANidine  (ZANAFLEX ) 4 MG tablet Take 1 tablet (4 mg total) by mouth every 8 (eight) hours as needed for muscle spasms. 60 tablet 1   benzonatate  (TESSALON ) 100 MG capsule Take 1 capsule by mouth three times a day as need for cough 90 capsule 1   glipiZIDE  (GLUCOTROL  XL) 2.5 MG 24 hr tablet Take 1 tablet (2.5 mg total) by mouth daily with breakfast. 90 tablet 3   linagliptin  (TRADJENTA ) 5 MG TABS tablet Take 1 tablet (5 mg total) by mouth daily. 90 tablet 3   Misc. Devices MISC Fistula sleeve for R arm.  Diagnoses: End-stage renal disease on hemodialysis 1 each 0   Misc. Devices MISC 1. Incontinence supplies.  2. Briefs.  Diagnosis-urinary incontinence 1 each 11   Misc. Devices MISC Nepro dialysis protein drink.  Diagnosis end-stage renal disease (Patient taking differently: Take 10 g by mouth See admin instructions. Nepro dialysis protein drink (Diagnosis end-stage renal disease)- Mix 10 grams of powder and mix into food daily as needed for low protein) 30 each 11   montelukast  (SINGULAIR ) 10 MG tablet Take 1 tablet (10 mg total) by mouth at bedtime. 90 tablet 1   No current facility-administered medications for this visit.    PHYSICAL EXAM: Vitals:   05/03/24 1356  BP: (!) 128/50  Pulse: 75  Resp: 20  SpO2: 91%  Weight: 165 lb 12.8 oz (75.2 kg)  Height: 5' 2 (1.575 m)   Body mass index is 30.33 kg/m.  Wt Readings from Last 3 Encounters:  05/03/24 165 lb 12.8 oz (75.2 kg)  04/07/24 166 lb 9.6 oz (75.6 kg)  04/05/24 166 lb (75.3 kg)    General: Well developed, well nourished female in no apparent distress.  HEENT: AT/[place], no external lesions.  Eyes: Conjunctiva clear and no icterus. Neck: Neck supple  Lungs: Respirations not  labored Neurologic: Alert, oriented, normal speech Extremities / Skin: Dry. No sores or rashes noted.  Psychiatric: Does not appear depressed or anxious  Diabetic Foot Exam - Simple   No data filed     LABS Reviewed Lab Results  Component Value Date   HGBA1C 6.7 03/01/2024   HGBA1C 7.4 (H) 08/17/2023   HGBA1C 6.5 04/16/2023   No results found for: FRUCTOSAMINE Lab Results  Component Value Date   CHOL 106 09/01/2021   HDL 46 09/01/2021   LDLCALC 48 09/01/2021   TRIG 61 09/01/2021   CHOLHDL 2.3 09/01/2021   Lab Results  Component Value Date   MICRALBCREAT 3,218 (H) 01/04/2021   MICRALBCREAT 2,873 (H) 09/14/2019   Lab Results  Component Value Date   CREATININE 5.03 (H) 04/15/2024   No results found for: GFR  ASSESSMENT / PLAN  1. Type 2 diabetes mellitus with hyperglycemia, unspecified whether long term insulin  use (HCC)   2. Type 2 diabetes mellitus with other specified complication, without long-term current use of insulin  (HCC)      Diabetes Mellitus type 2, complicated by CKD/ESRD/diabetic neuropathy. - Diabetic status / severity: Uncontrolled with hyperglycemia.  Lab Results  Component Value Date   HGBA1C 6.7 03/01/2024    - Hemoglobin A1c goal : <7%  Discussed about type 2 diabetes mellitus and importance of controlling blood sugar.  Patient prefers not to be on insulin  therapy, she had bad experience with insulin  while hospitalized.  Discussed about limiting carbohydrate and sugary meals.  - Medications: See below.  I) start linagliptin  5 mg daily.  Asked to check cost and coverage with pharmacy.  Order placed.  Stop glipizide  being on linagliptin .  Okay to use glipizide  in case of hyperglycemia men related with or in case of taking steroid.  If linagliptin  is not covered by medical insurance, will stay on the glipizide  extended release 2.5 to 5 mg daily.   - Home glucose testing: In the morning fasting and at bedtime occasionally and asked  to bring glucometer in the follow-up visit. - Discussed/ Gave Hypoglycemia treatment plan.  # Consult : not required at this time.   # Annual urine for microalbuminuria/ creatinine ratio, + microalbuminuria currently, she has ESRD following with nephrology on hemodialysis. Last  Lab Results  Component Value Date   MICRALBCREAT 3,218 (H) 01/04/2021    # Foot check nightly / neuropathy.  # Annual dilated diabetic eye exams.   - Diet: Make healthy diabetic food choices - Life style / activity / exercise: Discussed.  2. Blood pressure  -  BP Readings from Last 1 Encounters:  05/03/24 (!) 128/50    - Control is in target.  - No change in current plans.  3. Lipid status / Hyperlipidemia - Last  Lab Results  Component Value Date   LDLCALC 48 09/01/2021   - Continue atorvastatin  80 mg daily.  Managed by PCP.  Diagnoses and all orders for this visit:  Type 2 diabetes mellitus with hyperglycemia, unspecified whether long term insulin  use (HCC) -     glipiZIDE  (GLUCOTROL  XL) 2.5 MG 24 hr tablet; Take 1 tablet (2.5 mg total) by mouth daily with breakfast.  Type 2 diabetes mellitus with other specified complication, without long-term current use of insulin  (HCC) -     linagliptin  (TRADJENTA ) 5 MG TABS tablet; Take 1 tablet (5 mg total) by mouth daily.     DISPOSITION Follow up in clinic in 6 weeks suggested.   All questions answered and patient verbalized understanding of the plan.  [place] Brindley Madarang, MD [facility] Endocrinology [facility] [address] [place], [address] [place], [place] [address] Phone # [phone]  At least part of this note was generated using voice recognition software. Inadvertent word errors may have occurred, which were not recognized during the proofreading process.

## 2024-05-04 ENCOUNTER — Encounter: Payer: Self-pay | Admitting: Endocrinology

## 2024-05-04 DIAGNOSIS — Z992 Dependence on renal dialysis: Secondary | ICD-10-CM | POA: Diagnosis not present

## 2024-05-04 DIAGNOSIS — N186 End stage renal disease: Secondary | ICD-10-CM | POA: Diagnosis not present

## 2024-05-04 DIAGNOSIS — N2581 Secondary hyperparathyroidism of renal origin: Secondary | ICD-10-CM | POA: Diagnosis not present

## 2024-05-04 NOTE — Telephone Encounter (Signed)
 Reviewed

## 2024-05-05 ENCOUNTER — Other Ambulatory Visit: Payer: Self-pay

## 2024-05-05 ENCOUNTER — Ambulatory Visit: Admitting: Physical Therapy

## 2024-05-05 ENCOUNTER — Other Ambulatory Visit (HOSPITAL_COMMUNITY): Payer: Self-pay

## 2024-05-05 ENCOUNTER — Encounter: Payer: Self-pay | Admitting: Physical Therapy

## 2024-05-05 DIAGNOSIS — R2689 Other abnormalities of gait and mobility: Secondary | ICD-10-CM | POA: Diagnosis not present

## 2024-05-05 DIAGNOSIS — M5459 Other low back pain: Secondary | ICD-10-CM

## 2024-05-05 DIAGNOSIS — M25551 Pain in right hip: Secondary | ICD-10-CM | POA: Diagnosis not present

## 2024-05-05 DIAGNOSIS — R262 Difficulty in walking, not elsewhere classified: Secondary | ICD-10-CM | POA: Diagnosis not present

## 2024-05-05 DIAGNOSIS — M6281 Muscle weakness (generalized): Secondary | ICD-10-CM

## 2024-05-05 NOTE — Therapy (Addendum)
 OUTPATIENT PHYSICAL THERAPY THORACOLUMBAR     Patient Name: Paula [name]  MRN: [id number] DOB:06-Oct-1945, 78 y.o., female Today's Date: 05/05/2024  END OF SESSION:  PT End of Session - 05/05/24 1144     Visit Number 13    Date for Recertification  06/04/24    PT Start Time 1145    PT Stop Time 1230    PT Time Calculation (min) 45 min    Activity Tolerance Patient tolerated treatment well    Behavior During Therapy [facility] for tasks assessed/performed             Past Medical History:  Diagnosis Date   Allergic rhinitis    Allergy  1/12000   Arthritis    Asthma    Brain aneurysm    Cough    Diabetes mellitus    Diastolic CHF, chronic (HCC) 10/11/2011   ESRD on hemodialysis (HCC)    MWF   GERD (gastroesophageal reflux disease)    History of colon polyps 2012   tubular adenoma    Hyperlipidemia    Hypertension    Neuropathy 10/11/2011   PONV (postoperative nausea and vomiting)    one time after lymph node surgery   Past Surgical History:  Procedure Laterality Date   AV FISTULA PLACEMENT Right 10/15/2021   Procedure: ARTERIOVENOUS (AV) FISTULA  WITH PLACEMENT OF GORE-TEX STRETCH  GRAFT (4-56mmx45cm);  Surgeon: Eliza Lonni RAMAN, MD;  Location: [facility] OR;  Service: Vascular;  Laterality: Right;   CATARACT EXTRACTION     right eye   IR 3D INDEPENDENT WKST  12/14/2017   IR ANGIO INTRA EXTRACRAN SEL COM CAROTID INNOMINATE BILAT MOD SED  09/15/2017   IR ANGIO INTRA EXTRACRAN SEL COM CAROTID INNOMINATE UNI L MOD SED  12/14/2017   IR ANGIO VERTEBRAL SEL VERTEBRAL BILAT MOD SED  09/15/2017   IR RADIOLOGIST EVAL & MGMT  09/10/2017   IR RADIOLOGIST EVAL & MGMT  10/19/2017   lymphatic mass surgery     NASAL TURBINATE REDUCTION     RADIOLOGY WITH ANESTHESIA N/A 12/14/2017   Procedure: RADIOLOGY WITH ANESTHESIA EMBOLIZATION;  Surgeon: Dolphus Carrion, MD;  Location: [facility] OR;  Service: Radiology;  Laterality: N/A;   VIDEO BRONCHOSCOPY Bilateral 11/18/2017   Procedure:  VIDEO BRONCHOSCOPY WITHOUT FLUORO;  Surgeon: Shelah Lamar RAMAN, MD;  Location: [facility] ENDOSCOPY;  Service: Cardiopulmonary;  Laterality: Bilateral;   Patient Active Problem List   Diagnosis Date Noted   Hypoxemia 12/29/2023   Abnormal electrocardiogram (ECG) (EKG) 12/29/2023   Left ventricular hypertrophy 12/29/2023   CHF (congestive heart failure) (HCC) 10/12/2023   Blurry vision 10/11/2023   Anemia due to chronic kidney disease 10/11/2023   Acute respiratory failure with hypoxia (HCC) 08/17/2023   PNA (pneumonia) 08/17/2023   Volume overload 08/17/2023   Acute hypoxic respiratory failure (HCC) 08/17/2023   SNHL (sensory-neural hearing loss), 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   ESRD on dialysis (HCC) 11/20/2022   Mold exposure 08/14/2022   Opioid withdrawal (HCC) 08/14/2022   ILD (interstitial lung disease) (HCC) 08/14/2022   Secondary hyperparathyroidism, renal 08/14/2022   Paroxysmal A-fib (HCC) 05/20/2022   Hypertrophy of nasal turbinates 09/05/2021   Chronic kidney disease, stage 5 (HCC)    Aortic atherosclerosis 06/24/2021   Chronic rhinitis 10/01/2017   Seasonal allergic conjunctivitis 10/01/2017   Aneurysm 09/24/2017   Acute metabolic encephalopathy 09/02/2017   Allergic rhinitis 08/20/2017   Palpitations 07/10/2016   Gout 01/23/2016   History of cataract 02/27/2015   Seasonal and perennial allergic rhinitis 02/15/2012   Chronic diastolic CHF (  congestive heart failure) (HCC) 10/11/2011   GERD (gastroesophageal reflux disease)    Type 2 diabetes mellitus without complication, without long-term current use of insulin  (HCC) 07/03/2010   Hyperlipidemia 07/03/2010   Essential hypertension 07/03/2010    PCP: Corrina Sabin  REFERRING PROVIDER: Corrina Sabin  REFERRING DIAG: M54.50,G89.29 (ICD-10-CM) - Chronic right-sided low back pain without sciaticaM25.551 (ICD-10-CM) - Right hip pain  Rationale for Evaluation and Treatment: Rehabilitation  THERAPY DIAG:  Muscle weakness  (generalized) - Plan: PT plan of care cert/re-cert  Difficulty in walking, not elsewhere classified - Plan: PT plan of care cert/re-cert  Balance problem - Plan: PT plan of care cert/re-cert  Other low back pain - Plan: PT plan of care cert/re-cert  Pain in right hip - Plan: PT plan of care cert/re-cert  ONSET DATE: 10/11/23  SUBJECTIVE:                                                                                                                                                                                           SUBJECTIVE STATEMENT: Pressure has been going up and down. Been having a lot of MD appointment. One ER visit.  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   She experiences pain in the lower back, specifically in the right hip and lower back, which makes standing for more than two minutes difficult. The pain is localized to the hip area without radiation down the leg. She is receiving physical therapy at home and is interested in outpatient therapy. Tylenol  and a heating pad provide pain relief. Hip x-ray from 2024 revealed degenerative changes send pubic symphysis, both hips, SI joints and lower lumbar spine.  She fell from her bed last Monday, resulting in pain on the left side of her head and right shoulder, which was swollen but has improved. No current swelling or pain with pressure. She experiences dizziness at times and uses a cane occasionally.     PAIN:  Are you having pain? Yes: NPRS scale: 7/10 low back pain Pain location: R hip and midline low back  Pain description: not there all the time, if I am sitting I am okay Aggravating factors: when  I get up, when I am standing, walking  Relieving factors: Tylenol  and gabapentin ,   PRECAUTIONS: Fall  RED FLAGS: None   WEIGHT BEARING RESTRICTIONS: No  FALLS:  Has patient fallen in last 6 months? Yes. Number of falls 2 knee gave out in March, then 1 time fell out of the bed   LIVING ENVIRONMENT: Lives with: lives with their family Lives in: House/apartment Stairs: Yes: Internal: 14 steps; on right going up and the wall on the other side Has following equipment at home: Single point cane and Walker - 2 wheeled  OCCUPATION: N/A  PLOF: Independent with basic ADLs  PATIENT GOALS: to get rid of the pain, be able to walk and stand for longer than 2 mins without the pain  NEXT MD VISIT: 03/01/24  OBJECTIVE:  Note: Objective measures were completed at Evaluation unless otherwise noted.  DIAGNOSTIC FINDINGS:  X ray ordered for hips, lumbar, and knees   COGNITION: Overall cognitive status: Within functional limits for tasks assessed     SENSATION: WFL  POSTURE: rounded shoulders, forward head, and increased thoracic kyphosis  PALPATION: TTP and pain lumbar spine   LUMBAR ROM:   AROM eval 01/14/24 01/21/24 03/08/24 03/21/24  Flexion Distal shin with pain [facility]   WFL  Extension 25% available Limited 25% Limited 25% [facility] WFL  Right lateral flexion Mid thigh with pain Limited 25% Limited 25% 25% limited  WFL  Left lateral flexion Mid thigh with pain WFL   WFL  Right rotation 75% available with pain [facility]   WFL  Left rotation 75% available with pain WFL   WFL   (Blank rows = not tested)  LOWER EXTREMITY ROM:   grossly WFL   LOWER EXTREMITY MMT:  grossly 4-/5 BLE    FUNCTIONAL TESTS:  5 times sit to stand: 43s from elevated mat and needing UE push off, loses balance on to  heels  Timed up and go (TUG): 24s   GAIT: Distance walked: in clinic distances Assistive device utilized: None Level of assistance: CGA Comments: furniture walking, or reaches out to grab daughter's hand to walk  TREATMENT DATE:  05/05/24 BP 123/59 HR 80 Bike L 3 x 6 min Goals Sit to stands 2x10 LE on thighs  LAQ 3lb 2x10 HS curls green 2x10 Passive stretching to bilateral HS, piriformis, Glute, ITB  04/14/24 Nustep L 6 Resisted gait 30# 4 ways 4 x each 10# cable row 10 x 5# cable shld ext 10x 5# cable hip 4 way 10 x- pain and weakness noted RT LB/glut with ex. Left side weakness with ext and abd but no pain Passive stretching to bilateral HS, piriformis, Glute, ITB   04/12/24 Nustep L 6 7 min STS 5 x 14.2 sec, assessed goals Knee ext 5# 2 sets 10 HS curl 20# 2 sets 10 STS with wt ball press 10x then 10 x OH Cable pulley shld ext 5# 2 sets 10 Resisted trunk ext 20 x Leg Press 30# 2 sets 10 Passive stretching to bilateral HS, piriformis, Glute, ITB      03/22/24 Bike L 3x 5 min Goals  5x sit tot stand 22.23 sec Rows & Ext green 2x10 Passive stretching to bilateral HS, piriformis, Glute, ITB Gait 161ft ~ 52.41 seconds to complete  03/08/24 NuStep L 5 x 6 min  HS curls 20lb 2x10 Leg Ext 5lb 2x10 Rows & Ext green 2x10 Passive stretching to bilateral HS, piriformis, Glute, ITB  02/16/24 NuStep Leg ext HS curls  STS  on airex Walking on beam  Step ups   02/11/24 NuStep L5x70mins Shoulder ext 5# 2x10 Step ups 4  Feet on ball rotations and knees to chest x10 Bridges 2x10 Passive stretching HS, glutes, SKTC, LTR STS 2x10  01/28/24 NuStep L5x47mins  STS on airex 2x10 HS curls 20lb 2x10 Leg Ext 10lb 2x10 Shoulder ext and rows green band 2x10 Passive stretching- HS, SKTC, LTR     01/21/24 NuStep L5 x 6 min Goals  Lumbar ROM 5x S2S 17.77 LE did push against table on first rep HS curls 20lb 2x10 Leg Ext 10lb 2x10 Shoulder Ext 5lb 2x10 Bridges  x10 Feet on ball bridge, obl and HS curl PROM BIL LE and trunk- tightness RT> left.  HS, piriformis, Double and single K2C   STM to R ITB  01/14/24 NuStep L5 x 6 min Goals  Lumbar ORM 5x S2S 19.54 LE did push against table on first rep  Gait three laps ~ 337ft some fatigue and instability at times  HS curls 20lb 2x10 Leg Ext 10lb 2x10 Feet on ball bridge, obl and HS curl PROM BIL LE and trunk- tightness RT> left. RT IT tenderness  HS, piriformis, Double and single [facility]  01/12/24 Nustep L5  6 min 20lb resisted side steps x5 Hip add ball squeeze 2x10 Hip abd green 2x10 Sit to stand 2x10 Feet on ball bridge, obl and HS curl PROM BIL LE and trunk- tightness RT> left. RT IT tenderness  HS, piriformis, Double and single [facility]   01/07/24 Nustep L 4 6 min Standing red tband shld ext and row 2 sets 10 Standing red tband hip ext and abd 10 xeach Seated trunk ext 2 sets 10 green tband STS 2 sets 5 with wt ball chest press Seated clams and hip flex 2 sets 10 Seated ADD with ball squeeze PROM BIL LE and trunk- tightness RT> left. RT IT tenderness Feet on ball bridge, obl and HS curl Isometric abdominals   12/31/23 Nustep L 3 Feet on ball bridge, KTC and obl 10 x each Isometric abdominals 10 x hold 3 sec ADD ball squeeze 10 x then 10 x with bridge Red tband clams 2 sets 10  Red tband marching 2 sets 10 PROM BIL LE And trunk- very tight STS 5x with UE and the elevated mat without UE 5 x Seated row and shld ext 2 sets 10 Seated trunk ext 2 sets 10 green tband     12/24/23- EVAL, HEP                                                                                                                                 PATIENT EDUCATION:  Education details: POC, HEP, importance of movement Person educated: Patient and Child(ren) Education method: Medical illustrator Education comprehension: verbalized understanding  HOME EXERCISE PROGRAM: Access Code: [email or web address] URL:  [email or web address] Date: 01/12/2024 Prepared by: Tanda Sorrow  Exercises - Supine Lower Trunk Rotation  -  1 x daily - 7 x weekly - 2 sets - 10 reps - Seated March  - 1 x daily - 7 x weekly - 2 sets - 10 reps - Seated Hip Abduction with Resistance  - 1 x daily - 7 x weekly - 2 sets - 10 reps - Sit to Stand  - 1 x daily - 7 x weekly - 2 sets - 5 reps - Supine Bridge  - 1 x daily - 7 x weekly - 3 sets - 10 reps - Standing Hip Abduction with Counter Support  - 1 x daily - 7 x weekly - 3 sets - 10 reps  ASSESSMENT:  CLINICAL IMPRESSION:  Pt returns to therapy after multiple MD visit as well as one ER visit. She reports ongoing back pain since not being able to attend therapy. Reinforced the importance of completing her HEP. Her daughter has reported in the past that Pt has not doen HEP in the past. All interventions completed well. Bilateral tightness in the LE with passive stretching.Cuing verb and tactile needed.  Patient is a 78 y.o. female who was seen today for physical therapy evaluation and treatment for balance, gait, low back and hip pain. She is ambulating without a device but is very unsteady and shuffles her feet. She was advised to use the [facility] at the least. Patient has a chronic cough that is pretty bad. Her daughter reports she is not complaint with HEP and spends most of the day sitting on the sofa. She presents with LE weakness and balance deficits that put her at a high fall risk. She states that her back and hip pain prevent her from doing anything. Patient is very wobbly when going from sit to stand especially after prolonged sitting. She will benefit from skilled PT to address her gait and balance impairments and decrease her hip and back pain to be more mobile.   OBJECTIVE IMPAIRMENTS: Abnormal gait, cardiopulmonary status limiting activity, decreased activity tolerance, decreased balance, decreased coordination, decreased endurance, decreased mobility,  difficulty walking, decreased ROM, decreased strength, postural dysfunction, and pain.   ACTIVITY LIMITATIONS: lifting, bending, sitting, standing, squatting, transfers, and locomotion level  PARTICIPATION LIMITATIONS: meal prep, cleaning, laundry, shopping, and community activity  PERSONAL FACTORS: Age, Behavior pattern, Past/current experiences, Time since onset of injury/illness/exacerbation, and 3+ comorbidities: CHF, ESRD on dialysis, neuropathy, chronic cough are also affecting patient's functional outcome.   REHAB POTENTIAL: Fair    CLINICAL DECISION MAKING: Stable/uncomplicated  EVALUATION COMPLEXITY: Low   GOALS: Goals reviewed with patient? Yes  SHORT TERM GOALS: Target date: 02/04/24  Patient will be independent with initial HEP. Baseline: given 12/24/23 Goal status: 01/07/24 MET  2.  Patient will demonstrate decreased fall risk by scoring < 15 sec on TUG. Baseline: 24s Goal status: 01/07/24 MET   LONG TERM GOALS: Target date: 03/17/24  Patient will be independent with advanced/ongoing HEP to improve outcomes and carryover.  Baseline:  Goal status: ongoing 03/22/24  04/12/24 MET  2.  Patient will be able to ambulate 500' with good safety to access community.  Baseline: not using AD, very unsteady and unable to walk or stand more than 2 mins Goal status: Progressing slow gait 03/08/24, ongoing 03/22/24  progressing 04/12/24, Ongoing 05/05/24  3.  Patient will report 50-75% improvement in R hip and low back pain Baseline: 10/10  Goal status: Progressing 7/10, Met 03/22/24  MET 04/12/24, back has been hurting 05/05/24  4.  Patient will demonstrate improved functional LE strength as demonstrated  by 5xSTS <20s Baseline: 43s elevated mat and UE push off Goal status: Progressing 01/14/24, Progressing 01/21/24,  ongoing 03/21/24  MET 04/12/24  5.  Patient will increase standing and walking tolerance to 10 mins  Baseline: 2-5 mins  Goal status: Ongoing 3 min 03/08/24, ongoing 03/22/24  and  04/12/24, 05/05/24 ongoing  /  PLAN:  PT FREQUENCY: 2x/week  PT DURATION: 12 weeks  PLANNED INTERVENTIONS: 97110-Therapeutic exercises, 97530- Therapeutic activity, 97112- Neuromuscular re-education, 97535- Self Care, [id number]- Manual therapy, [phone]- Gait training, [phone]- Electrical stimulation (unattended), Patient/Family education, Balance training, Stair training, Dry Needling, Joint mobilization, Spinal mobilization, Cryotherapy, and Moist heat.  PLAN FOR NEXT SESSION: LE strengthening, balance training, low back and hip stretches .     OBADIAH OZELL ORN, PT 05/05/2024, 12:33 PM

## 2024-05-06 DIAGNOSIS — Z992 Dependence on renal dialysis: Secondary | ICD-10-CM | POA: Diagnosis not present

## 2024-05-06 DIAGNOSIS — N186 End stage renal disease: Secondary | ICD-10-CM | POA: Diagnosis not present

## 2024-05-06 DIAGNOSIS — N2581 Secondary hyperparathyroidism of renal origin: Secondary | ICD-10-CM | POA: Diagnosis not present

## 2024-05-09 DIAGNOSIS — N2581 Secondary hyperparathyroidism of renal origin: Secondary | ICD-10-CM | POA: Diagnosis not present

## 2024-05-09 DIAGNOSIS — N186 End stage renal disease: Secondary | ICD-10-CM | POA: Diagnosis not present

## 2024-05-09 DIAGNOSIS — Z992 Dependence on renal dialysis: Secondary | ICD-10-CM | POA: Diagnosis not present

## 2024-05-10 ENCOUNTER — Ambulatory Visit: Admitting: Physical Therapy

## 2024-05-10 DIAGNOSIS — M6281 Muscle weakness (generalized): Secondary | ICD-10-CM | POA: Diagnosis not present

## 2024-05-10 DIAGNOSIS — Z992 Dependence on renal dialysis: Secondary | ICD-10-CM | POA: Diagnosis not present

## 2024-05-10 DIAGNOSIS — M5459 Other low back pain: Secondary | ICD-10-CM | POA: Diagnosis not present

## 2024-05-10 DIAGNOSIS — I129 Hypertensive chronic kidney disease with stage 1 through stage 4 chronic kidney disease, or unspecified chronic kidney disease: Secondary | ICD-10-CM | POA: Diagnosis not present

## 2024-05-10 DIAGNOSIS — R262 Difficulty in walking, not elsewhere classified: Secondary | ICD-10-CM | POA: Diagnosis not present

## 2024-05-10 DIAGNOSIS — M25551 Pain in right hip: Secondary | ICD-10-CM | POA: Diagnosis not present

## 2024-05-10 DIAGNOSIS — R2689 Other abnormalities of gait and mobility: Secondary | ICD-10-CM | POA: Diagnosis not present

## 2024-05-10 DIAGNOSIS — N186 End stage renal disease: Secondary | ICD-10-CM | POA: Diagnosis not present

## 2024-05-10 NOTE — Therapy (Signed)
 OUTPATIENT PHYSICAL THERAPY THORACOLUMBAR     Patient Name: Paula [name]  MRN: [id number] DOB:15-Apr-1946, 78 y.o., female Today's Date: 05/10/2024  END OF SESSION:  PT End of Session - 05/10/24 1146     Visit Number 14    Date for Recertification  06/04/24    Authorization Type [employer]    PT Start Time 1145    PT Stop Time 1225    PT Time Calculation (min) 40 min             Past Medical History:  Diagnosis Date   Allergic rhinitis    Allergy  1/12000   Arthritis    Asthma    Brain aneurysm    Cough    Diabetes mellitus    Diastolic CHF, chronic (HCC) 10/11/2011   ESRD on hemodialysis (HCC)    MWF   GERD (gastroesophageal reflux disease)    History of colon polyps 2012   tubular adenoma    Hyperlipidemia    Hypertension    Neuropathy 10/11/2011   PONV (postoperative nausea and vomiting)    one time after lymph node surgery   Past Surgical History:  Procedure Laterality Date   AV FISTULA PLACEMENT Right 10/15/2021   Procedure: ARTERIOVENOUS (AV) FISTULA  WITH PLACEMENT OF GORE-TEX STRETCH  GRAFT (4-27mmx45cm);  Surgeon: Eliza Lonni RAMAN, MD;  Location: [facility] OR;  Service: Vascular;  Laterality: Right;   CATARACT EXTRACTION     right eye   IR 3D INDEPENDENT WKST  12/14/2017   IR ANGIO INTRA EXTRACRAN SEL COM CAROTID INNOMINATE BILAT MOD SED  09/15/2017   IR ANGIO INTRA EXTRACRAN SEL COM CAROTID INNOMINATE UNI L MOD SED  12/14/2017   IR ANGIO VERTEBRAL SEL VERTEBRAL BILAT MOD SED  09/15/2017   IR RADIOLOGIST EVAL & MGMT  09/10/2017   IR RADIOLOGIST EVAL & MGMT  10/19/2017   lymphatic mass surgery     NASAL TURBINATE REDUCTION     RADIOLOGY WITH ANESTHESIA N/A 12/14/2017   Procedure: RADIOLOGY WITH ANESTHESIA EMBOLIZATION;  Surgeon: Dolphus Carrion, MD;  Location: [facility] OR;  Service: Radiology;  Laterality: N/A;   VIDEO BRONCHOSCOPY Bilateral 11/18/2017   Procedure: VIDEO BRONCHOSCOPY WITHOUT FLUORO;  Surgeon: Shelah Lamar RAMAN, MD;  Location: [facility]  ENDOSCOPY;  Service: Cardiopulmonary;  Laterality: Bilateral;   Patient Active Problem List   Diagnosis Date Noted   Hypoxemia 12/29/2023   Abnormal electrocardiogram (ECG) (EKG) 12/29/2023   Left ventricular hypertrophy 12/29/2023   CHF (congestive heart failure) (HCC) 10/12/2023   Blurry vision 10/11/2023   Anemia due to chronic kidney disease 10/11/2023   Acute respiratory failure with hypoxia (HCC) 08/17/2023   PNA (pneumonia) 08/17/2023   Volume overload 08/17/2023   Acute hypoxic respiratory failure (HCC) 08/17/2023   SNHL (sensory-neural hearing loss), 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   ESRD on dialysis (HCC) 11/20/2022   Mold exposure 08/14/2022   Opioid withdrawal (HCC) 08/14/2022   ILD (interstitial lung disease) (HCC) 08/14/2022   Secondary hyperparathyroidism, renal 08/14/2022   Paroxysmal A-fib (HCC) 05/20/2022   Hypertrophy of nasal turbinates 09/05/2021   Chronic kidney disease, stage 5 (HCC)    Aortic atherosclerosis 06/24/2021   Chronic rhinitis 10/01/2017   Seasonal allergic conjunctivitis 10/01/2017   Aneurysm 09/24/2017   Acute metabolic encephalopathy 09/02/2017   Allergic rhinitis 08/20/2017   Palpitations 07/10/2016   Gout 01/23/2016   History of cataract 02/27/2015   Seasonal and perennial allergic rhinitis 02/15/2012   Chronic diastolic CHF (congestive heart failure) (HCC) 10/11/2011   GERD (gastroesophageal reflux disease)  Type 2 diabetes mellitus without complication, without long-term current use of insulin  (HCC) 07/03/2010   Hyperlipidemia 07/03/2010   Essential hypertension 07/03/2010    PCP: Corrina Sabin  REFERRING PROVIDER: Corrina Sabin  REFERRING DIAG: M54.50,G89.29 (ICD-10-CM) - Chronic right-sided low back pain without sciaticaM25.551 (ICD-10-CM) - Right hip pain  Rationale for Evaluation and Treatment: Rehabilitation  THERAPY DIAG:  Muscle weakness (generalized)  Difficulty in walking, not elsewhere classified  Other low back  pain  Pain in right hip  ONSET DATE: 10/11/23  SUBJECTIVE:                                                                                                                                                                                           SUBJECTIVE STATEMENT: A lot of medical stuff going on. Daughter states not doing HEP and asked if we could change it up. 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   She experiences pain in the lower back, specifically in the right hip and lower back, which makes standing for more than two minutes difficult. The pain is localized to the hip area without radiation down the leg. She is receiving physical therapy at home and is interested in outpatient therapy. Tylenol  and a heating pad provide pain relief. Hip x-ray from 2024 revealed degenerative changes send pubic symphysis, both hips, SI joints and lower lumbar spine.  She fell from her bed last Monday, resulting in pain on the left side of her head and right shoulder, which was swollen but has improved. No current swelling or pain with pressure. She experiences dizziness at times and uses a cane occasionally.    PAIN:  Are you having pain? Yes: NPRS scale: 7/10 low back pain Pain location: R hip and midline low back  Pain description: not there all the time, if I am sitting I am okay Aggravating factors: when I get up, when I am standing, walking  Relieving factors: Tylenol  and gabapentin ,   PRECAUTIONS:  Fall  RED FLAGS: None   WEIGHT BEARING RESTRICTIONS: No  FALLS:  Has patient fallen in last 6 months? Yes. Number of falls 2 knee gave out in March, then 1 time fell out of the bed  LIVING ENVIRONMENT: Lives with: lives with their family Lives in: House/apartment Stairs: Yes: Internal: 14 steps; on right going up and the wall on the other side Has following equipment at home: Single point cane and Walker - 2 wheeled  OCCUPATION: N/A  PLOF: Independent with basic ADLs  PATIENT GOALS: to get rid of the pain, be able to walk and stand for longer than 2 mins without the pain  NEXT MD VISIT: 03/01/24  OBJECTIVE:  Note: Objective measures were completed at Evaluation unless otherwise noted.  DIAGNOSTIC FINDINGS:  X ray ordered for hips, lumbar, and knees   COGNITION: Overall cognitive status: Within functional limits for tasks assessed     SENSATION: WFL  POSTURE: rounded shoulders, forward head, and increased thoracic kyphosis  PALPATION: TTP and pain lumbar spine   LUMBAR ROM:   AROM eval 01/14/24 01/21/24 03/08/24 03/21/24  Flexion Distal shin with pain [facility]   WFL  Extension 25% available Limited 25% Limited 25% [facility] WFL  Right lateral flexion Mid thigh with pain Limited 25% Limited 25% 25% limited  WFL  Left lateral flexion Mid thigh with pain WFL   WFL  Right rotation 75% available with pain [facility]   WFL  Left rotation 75% available with pain WFL   WFL   (Blank rows = not tested)  LOWER EXTREMITY ROM:   grossly WFL   LOWER EXTREMITY MMT:  grossly 4-/5 BLE    FUNCTIONAL TESTS:  5 times sit to stand: 43s from elevated mat and needing UE push off, loses balance on to heels  Timed up and go (TUG): 24s   GAIT: Distance walked: in clinic distances Assistive device utilized: None Level of assistance: CGA Comments: furniture walking, or reaches out to grab daughter's hand to walk  TREATMENT DATE:   05/10/24 Focus session on est new HEP she can do in bed to hoepfully  increase compliance. Daughter present for carry over Access Code: [facility] URL: [email or web address] Date: 05/10/2024 Prepared by: Marino Rogerson  Exercises - Supine Posterior Pelvic Tilt  - 1 x daily - 7 x weekly - 3 sets - 10 reps - Supine Bridge  - 2 x daily - 7 x weekly - 1 sets - 10 reps - Supine Lower Trunk Rotation  - 2 x daily - 7 x weekly - 1 sets - 10 reps - Supine Hamstring Stretch with Strap  - 2 x daily - 7 x weekly - 2 sets - 5 reps - 10 hold - Supine March  - 2 x daily - 7 x weekly - 2 sets - 10 reps - Hooklying Abduction with Resistance  - 2 x daily - 7 x weekly - 1 sets - 10 reps  PROM BIL LE and trunk Nustep L 5   05/05/24 BP 123/59 HR 80 Bike L 3 x 6 min Goals Sit to stands 2x10 LE on thighs  LAQ 3lb 2x10 HS curls green 2x10 Passive stretching to bilateral HS, piriformis, Glute, ITB  04/14/24 Nustep L 6 Resisted gait 30# 4 ways 4 x each 10# cable row 10 x 5# cable shld ext 10x 5# cable hip 4 way 10 x- pain and weakness noted RT LB/glut with ex. Left side weakness with ext and abd but no pain Passive stretching to bilateral HS, piriformis, Glute, ITB   04/12/24 Nustep L 6 7 min STS 5 x 14.2 sec, assessed goals Knee ext 5# 2 sets 10 HS curl 20# 2 sets 10 STS with wt ball press 10x  then 10 x OH Cable pulley shld ext 5# 2 sets 10 Resisted trunk ext 20 x Leg Press 30# 2 sets 10 Passive stretching to bilateral HS, piriformis, Glute, ITB      03/22/24 Bike L 3x 5 min Goals  5x sit tot stand 22.23 sec Rows & Ext green 2x10 Passive stretching to bilateral HS, piriformis, Glute, ITB Gait 131ft ~ 52.41 seconds to complete  03/08/24 NuStep L 5 x 6 min  HS curls 20lb 2x10 Leg Ext 5lb 2x10 Rows & Ext green 2x10 Passive stretching to bilateral HS, piriformis, Glute, ITB  02/16/24 NuStep Leg ext HS curls  STS on airex Walking on beam  Step ups   02/11/24 NuStep L5x61mins Shoulder ext 5# 2x10 Step ups 4  Feet on ball  rotations and knees to chest x10 Bridges 2x10 Passive stretching HS, glutes, SKTC, LTR STS 2x10  01/28/24 NuStep L5x52mins  STS on airex 2x10 HS curls 20lb 2x10 Leg Ext 10lb 2x10 Shoulder ext and rows green band 2x10 Passive stretching- HS, SKTC, LTR     01/21/24 NuStep L5 x 6 min Goals  Lumbar ROM 5x S2S 17.77 LE did push against table on first rep HS curls 20lb 2x10 Leg Ext 10lb 2x10 Shoulder Ext 5lb 2x10 Bridges x10 Feet on ball bridge, obl and HS curl PROM BIL LE and trunk- tightness RT> left.  HS, piriformis, Double and single K2C   STM to R ITB  01/14/24 NuStep L5 x 6 min Goals  Lumbar ORM 5x S2S 19.54 LE did push against table on first rep  Gait three laps ~ 329ft some fatigue and instability at times  HS curls 20lb 2x10 Leg Ext 10lb 2x10 Feet on ball bridge, obl and HS curl PROM BIL LE and trunk- tightness RT> left. RT IT tenderness  HS, piriformis, Double and single [facility]  01/12/24 Nustep L5  6 min 20lb resisted side steps x5 Hip add ball squeeze 2x10 Hip abd green 2x10 Sit to stand 2x10 Feet on ball bridge, obl and HS curl PROM BIL LE and trunk- tightness RT> left. RT IT tenderness  HS, piriformis, Double and single [facility]   01/07/24 Nustep L 4 6 min Standing red tband shld ext and row 2 sets 10 Standing red tband hip ext and abd 10 xeach Seated trunk ext 2 sets 10 green tband STS 2 sets 5 with wt ball chest press Seated clams and hip flex 2 sets 10 Seated ADD with ball squeeze PROM BIL LE and trunk- tightness RT> left. RT IT tenderness Feet on ball bridge, obl and HS curl Isometric abdominals   12/31/23 Nustep L 3 Feet on ball bridge, KTC and obl 10 x each Isometric abdominals 10 x hold 3 sec ADD ball squeeze 10 x then 10 x with bridge Red tband clams 2 sets 10  Red tband marching 2 sets 10 PROM BIL LE And trunk- very tight STS 5x with UE and the elevated mat without UE 5 x Seated row and shld ext 2 sets 10 Seated trunk ext 2 sets 10 green  tband     12/24/23- EVAL, HEP  PATIENT EDUCATION:  Education details: POC, HEP, importance of movement Person educated: Patient and Child(ren) Education method: Medical illustrator Education comprehension: verbalized understanding  HOME EXERCISE PROGRAM: Access Code: [email or web address] URL: [email or web address] Date: 01/12/2024 Prepared by: Tanda Sorrow  Exercises - Supine Lower Trunk Rotation  - 1 x daily - 7 x weekly - 2 sets - 10 reps - Seated March  - 1 x daily - 7 x weekly - 2 sets - 10 reps - Seated Hip Abduction with Resistance  - 1 x daily - 7 x weekly - 2 sets - 10 reps - Sit to Stand  - 1 x daily - 7 x weekly - 2 sets - 5 reps - Supine Bridge  - 1 x daily - 7 x weekly - 3 sets - 10 reps - Standing Hip Abduction with Counter Support  - 1 x daily - 7 x weekly - 3 sets - 10 reps  ASSESSMENT:  CLINICAL IMPRESSION:  Focus session on est new HEP she can do in bed to hopefully increase compliance. Daughter present to help with carry over.  Patient is a 78 y.o. female who was seen today for physical therapy evaluation and treatment for balance, gait, low back and hip pain. She is ambulating without a device but is very unsteady and shuffles her feet. She was advised to use the [facility] at the least. Patient has a chronic cough that is pretty bad. Her daughter reports she is not complaint with HEP and spends most of the day sitting on the sofa. She presents with LE weakness and balance deficits that put her at a high fall risk. She states that her back and hip pain prevent her from doing anything. Patient is very wobbly when going from sit to stand especially after prolonged sitting. She will benefit from skilled PT to address her gait and balance impairments and decrease her hip and back pain to be more mobile.   OBJECTIVE IMPAIRMENTS:  Abnormal gait, cardiopulmonary status limiting activity, decreased activity tolerance, decreased balance, decreased coordination, decreased endurance, decreased mobility, difficulty walking, decreased ROM, decreased strength, postural dysfunction, and pain.   ACTIVITY LIMITATIONS: lifting, bending, sitting, standing, squatting, transfers, and locomotion level  PARTICIPATION LIMITATIONS: meal prep, cleaning, laundry, shopping, and community activity  PERSONAL FACTORS: Age, Behavior pattern, Past/current experiences, Time since onset of injury/illness/exacerbation, and 3+ comorbidities: CHF, ESRD on dialysis, neuropathy, chronic cough are also affecting patient's functional outcome.   REHAB POTENTIAL: Fair    CLINICAL DECISION MAKING: Stable/uncomplicated  EVALUATION COMPLEXITY: Low   GOALS: Goals reviewed with patient? Yes  SHORT TERM GOALS: Target date: 02/04/24  Patient will be independent with initial HEP. Baseline: given 12/24/23 Goal status: 01/07/24 MET  2.  Patient will demonstrate decreased fall risk by scoring < 15 sec on TUG. Baseline: 24s Goal status: 01/07/24 MET   LONG TERM GOALS: Target date: 03/17/24  Patient will be independent with advanced/ongoing HEP to improve outcomes and carryover.  Baseline:  Goal status: ongoing 03/22/24  04/12/24 MET  2.  Patient will be able to ambulate 500' with good safety to access community.  Baseline: not using AD, very unsteady and unable to walk or stand more than 2 mins Goal status: Progressing slow gait 03/08/24, ongoing 03/22/24  progressing 04/12/24, Ongoing 05/05/24  3.  Patient will report 50-75% improvement in R hip and low back pain Baseline: 10/10  Goal status: Progressing 7/10, Met 03/22/24  MET 04/12/24, back has been hurting 05/05/24  4.  Patient will demonstrate improved  functional LE strength as demonstrated by 5xSTS <20s Baseline: 43s elevated mat and UE push off Goal status: Progressing 01/14/24, Progressing 01/21/24,  ongoing  03/21/24  MET 04/12/24  5.  Patient will increase standing and walking tolerance to 10 mins  Baseline: 2-5 mins  Goal status: Ongoing 3 min 03/08/24, ongoing 03/22/24  and 04/12/24, 05/05/24 ongoing  /  PLAN:  PT FREQUENCY: 2x/week  PT DURATION: 12 weeks  PLANNED INTERVENTIONS: 97110-Therapeutic exercises, 97530- Therapeutic activity, 97112- Neuromuscular re-education, 97535- Self Care, [id number]- Manual therapy, [phone]- Gait training, [phone]- Electrical stimulation (unattended), Patient/Family education, Balance training, Stair training, Dry Needling, Joint mobilization, Spinal mobilization, Cryotherapy, and Moist heat.  PLAN FOR NEXT SESSION:check HEP cpmpliance     Dynastie Knoop,ANGIE, PTA 05/10/2024, 12:14 PM

## 2024-05-11 DIAGNOSIS — N2581 Secondary hyperparathyroidism of renal origin: Secondary | ICD-10-CM | POA: Diagnosis not present

## 2024-05-11 DIAGNOSIS — N186 End stage renal disease: Secondary | ICD-10-CM | POA: Diagnosis not present

## 2024-05-11 DIAGNOSIS — Z992 Dependence on renal dialysis: Secondary | ICD-10-CM | POA: Diagnosis not present

## 2024-05-12 ENCOUNTER — Ambulatory Visit: Payer: Self-pay

## 2024-05-12 ENCOUNTER — Telehealth: Payer: Self-pay | Admitting: Family Medicine

## 2024-05-12 ENCOUNTER — Ambulatory Visit: Attending: Family Medicine | Admitting: Physical Therapy

## 2024-05-12 ENCOUNTER — Encounter: Payer: Self-pay | Admitting: Physical Therapy

## 2024-05-12 DIAGNOSIS — M5459 Other low back pain: Secondary | ICD-10-CM | POA: Insufficient documentation

## 2024-05-12 DIAGNOSIS — R040 Epistaxis: Secondary | ICD-10-CM

## 2024-05-12 DIAGNOSIS — R2689 Other abnormalities of gait and mobility: Secondary | ICD-10-CM | POA: Diagnosis not present

## 2024-05-12 DIAGNOSIS — M25551 Pain in right hip: Secondary | ICD-10-CM | POA: Diagnosis not present

## 2024-05-12 DIAGNOSIS — R262 Difficulty in walking, not elsewhere classified: Secondary | ICD-10-CM | POA: Insufficient documentation

## 2024-05-12 DIAGNOSIS — M6281 Muscle weakness (generalized): Secondary | ICD-10-CM | POA: Diagnosis not present

## 2024-05-12 NOTE — Telephone Encounter (Signed)
Pt requesting new referral

## 2024-05-12 NOTE — Telephone Encounter (Signed)
 FYI Only or Action Required?: FYI only for provider. Patient to go to [facility]  Patient was last seen in primary care on 04/07/2024 by Newlin, Enobong, MD.  Called Nurse Triage reporting Tinnitus.  Symptoms began a week ago.  Interventions attempted: OTC medications: ear drops.  Symptoms are: gradually worsening.  Triage Disposition: See Physician Within 24 Hours  Patient/caregiver understands and will follow disposition?: Yes  Patient states that her ears have been ringing non stop for the past 3 days and she wants to make it stop    Reason for Disposition  Decreased hearing followed sudden, extremely loud noise (e.g., explosion, not just loud concert)  Answer Assessment - Initial Assessment Questions About 10 days ago- patient had earache, used drops with no relief. Pt states about 3 days ago -Sounds like a plane or train taking off in her left ear 24/7.  ENT- the one she was referred to she does not like- CRM already sent to provider for alternate referral. Tylenol - not really helping  Patient advised to go to UC to assess for possible ruptured ear drum, and need for abx. Daughter with her confirms they will go.  1. DESCRIPTION: Describe the sound you are hearing. (e.g., buzzing, hissing, humming, ringing)     Sounds like a plane or train in her ears. 2. LOCATION: Is the sound in one or both ears? If one, ask: Which ear?     Right ear 3. SEVERITY: How bad is it?      Horrible 4. ONSET: When did this begin? Did it start suddenly or come on gradually?     Earache for 10days and 3 days ago with the noise 5. PATTERN: Does this come and go, or has it been constant since it started?     constant 6. HEARING LOSS: Is your hearing decreased? (e.g., normal, decreased)       Decreased  7. OTHER SYMPTOMS: Do you have any other symptoms? (e.g., dizziness, earache)  Dizziness off and on since ear pain.  Protocols used: Tinnitus-A-AH

## 2024-05-12 NOTE — Therapy (Signed)
 OUTPATIENT PHYSICAL THERAPY THORACOLUMBAR     Patient Name: Paula [name]  MRN: [id number] DOB:1945-09-25, 78 y.o., female Today's Date: 05/12/2024  END OF SESSION:  PT End of Session - 05/12/24 1148     Visit Number 15    Date for Recertification  06/04/24    PT Start Time 1148    PT Stop Time 1230    PT Time Calculation (min) 42 min    Activity Tolerance Patient tolerated treatment well    Behavior During Therapy [facility] for tasks assessed/performed             Past Medical History:  Diagnosis Date   Allergic rhinitis    Allergy  1/12000   Arthritis    Asthma    Brain aneurysm    Cough    Diabetes mellitus    Diastolic CHF, chronic (HCC) 10/11/2011   ESRD on hemodialysis (HCC)    MWF   GERD (gastroesophageal reflux disease)    History of colon polyps 2012   tubular adenoma    Hyperlipidemia    Hypertension    Neuropathy 10/11/2011   PONV (postoperative nausea and vomiting)    one time after lymph node surgery   Past Surgical History:  Procedure Laterality Date   AV FISTULA PLACEMENT Right 10/15/2021   Procedure: ARTERIOVENOUS (AV) FISTULA  WITH PLACEMENT OF GORE-TEX STRETCH  GRAFT (4-38mmx45cm);  Surgeon: Eliza Lonni RAMAN, MD;  Location: [facility] OR;  Service: Vascular;  Laterality: Right;   CATARACT EXTRACTION     right eye   IR 3D INDEPENDENT WKST  12/14/2017   IR ANGIO INTRA EXTRACRAN SEL COM CAROTID INNOMINATE BILAT MOD SED  09/15/2017   IR ANGIO INTRA EXTRACRAN SEL COM CAROTID INNOMINATE UNI L MOD SED  12/14/2017   IR ANGIO VERTEBRAL SEL VERTEBRAL BILAT MOD SED  09/15/2017   IR RADIOLOGIST EVAL & MGMT  09/10/2017   IR RADIOLOGIST EVAL & MGMT  10/19/2017   lymphatic mass surgery     NASAL TURBINATE REDUCTION     RADIOLOGY WITH ANESTHESIA N/A 12/14/2017   Procedure: RADIOLOGY WITH ANESTHESIA EMBOLIZATION;  Surgeon: Dolphus Carrion, MD;  Location: [facility] OR;  Service: Radiology;  Laterality: N/A;   VIDEO BRONCHOSCOPY Bilateral 11/18/2017   Procedure:  VIDEO BRONCHOSCOPY WITHOUT FLUORO;  Surgeon: Shelah Lamar RAMAN, MD;  Location: [facility] ENDOSCOPY;  Service: Cardiopulmonary;  Laterality: Bilateral;   Patient Active Problem List   Diagnosis Date Noted   Hypoxemia 12/29/2023   Abnormal electrocardiogram (ECG) (EKG) 12/29/2023   Left ventricular hypertrophy 12/29/2023   CHF (congestive heart failure) (HCC) 10/12/2023   Blurry vision 10/11/2023   Anemia due to chronic kidney disease 10/11/2023   Acute respiratory failure with hypoxia (HCC) 08/17/2023   PNA (pneumonia) 08/17/2023   Volume overload 08/17/2023   Acute hypoxic respiratory failure (HCC) 08/17/2023   SNHL (sensory-neural hearing loss), 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   ESRD on dialysis (HCC) 11/20/2022   Mold exposure 08/14/2022   Opioid withdrawal (HCC) 08/14/2022   ILD (interstitial lung disease) (HCC) 08/14/2022   Secondary hyperparathyroidism, renal 08/14/2022   Paroxysmal A-fib (HCC) 05/20/2022   Hypertrophy of nasal turbinates 09/05/2021   Chronic kidney disease, stage 5 (HCC)    Aortic atherosclerosis 06/24/2021   Chronic rhinitis 10/01/2017   Seasonal allergic conjunctivitis 10/01/2017   Aneurysm 09/24/2017   Acute metabolic encephalopathy 09/02/2017   Allergic rhinitis 08/20/2017   Palpitations 07/10/2016   Gout 01/23/2016   History of cataract 02/27/2015   Seasonal and perennial allergic rhinitis 02/15/2012   Chronic diastolic CHF (  congestive heart failure) (HCC) 10/11/2011   GERD (gastroesophageal reflux disease)    Type 2 diabetes mellitus without complication, without long-term current use of insulin  (HCC) 07/03/2010   Hyperlipidemia 07/03/2010   Essential hypertension 07/03/2010    PCP: Corrina Sabin  REFERRING PROVIDER: Corrina Sabin  REFERRING DIAG: M54.50,G89.29 (ICD-10-CM) - Chronic right-sided low back pain without sciaticaM25.551 (ICD-10-CM) - Right hip pain  Rationale for Evaluation and Treatment: Rehabilitation  THERAPY DIAG:  Muscle weakness  (generalized)  Difficulty in walking, not elsewhere classified  Other low back pain  Pain in right hip  ONSET DATE: 10/11/23  SUBJECTIVE:                                                                                                                                                                                           SUBJECTIVE STATEMENT: Ok  PERTINENT HISTORY:  Paula [name]  is a 78 year old female with  a history of type 2 diabetes mellitus (A1c 76.9), hypertension, hypercholesterolemia, allergic rhinitis, severe upper airway cough syndrome, stage V CKD (managed by Dr [facility], currently on hemodialysis Tuesday, Thursday and Saturday), 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   She experiences pain in the lower back, specifically in the right hip and lower back, which makes standing for more than two minutes difficult. The pain is localized to the hip area without radiation down the leg. She is receiving physical therapy at home and is interested in outpatient therapy. Tylenol  and a heating pad provide pain relief. Hip x-ray from 2024 revealed degenerative changes send pubic symphysis, both hips, SI joints and lower lumbar spine.  She fell from her bed last Monday, resulting in pain on the left side of her head and right shoulder, which was swollen but has improved. No current swelling or pain with pressure. She experiences dizziness at times and uses a cane occasionally.    PAIN:  Are you having pain? Yes: NPRS scale: 0/10 low back pain Pain location: R hip and midline low back  Pain description: not there all the time, if I am sitting I am okay Aggravating factors: when I get up, when I am standing, walking  Relieving factors: Tylenol  and gabapentin ,   PRECAUTIONS: Fall  RED  FLAGS: None   WEIGHT BEARING RESTRICTIONS: No  FALLS:  Has patient fallen in last 6 months? Yes. Number of falls 2 knee gave out in March, then 1 time fell out of the bed   LIVING ENVIRONMENT:  Lives with: lives with their family Lives in: House/apartment Stairs: Yes: Internal: 14 steps; on right going up and the wall on the other side Has following equipment at home: Single point cane and Walker - 2 wheeled  OCCUPATION: N/A  PLOF: Independent with basic ADLs  PATIENT GOALS: to get rid of the pain, be able to walk and stand for longer than 2 mins without the pain  NEXT MD VISIT: 03/01/24  OBJECTIVE:  Note: Objective measures were completed at Evaluation unless otherwise noted.  DIAGNOSTIC FINDINGS:  X ray ordered for hips, lumbar, and knees   COGNITION: Overall cognitive status: Within functional limits for tasks assessed     SENSATION: WFL  POSTURE: rounded shoulders, forward head, and increased thoracic kyphosis  PALPATION: TTP and pain lumbar spine   LUMBAR ROM:   AROM eval 01/14/24 01/21/24 03/08/24 03/21/24  Flexion Distal shin with pain [facility]   WFL  Extension 25% available Limited 25% Limited 25% [facility] WFL  Right lateral flexion Mid thigh with pain Limited 25% Limited 25% 25% limited  WFL  Left lateral flexion Mid thigh with pain WFL   WFL  Right rotation 75% available with pain [facility]   WFL  Left rotation 75% available with pain WFL   WFL   (Blank rows = not tested)  LOWER EXTREMITY ROM:   grossly WFL   LOWER EXTREMITY MMT:  grossly 4-/5 BLE    FUNCTIONAL TESTS:  5 times sit to stand: 43s from elevated mat and needing UE push off, loses balance on to heels  Timed up and go (TUG): 24s   GAIT: Distance walked: in clinic distances Assistive device utilized: None Level of assistance: CGA Comments: furniture walking, or reaches out to grab daughter's hand to walk  TREATMENT DATE:  05/12/24 NuStep L5 x 6 min HS cutls 20lb 2x10 Leg Ext 5lb 2x10 Sit to stand  holding yellow ball 2x10 Seated rows green 2x10 Shoulder Ext green 2x10 Passive stretching to bilateral LE  Piriformis, HS, ITB, K2C  Double K2C, lower trunk rotation   05/10/24 Focus session on est new HEP she can do in bed to hoepfully increase compliance. Daughter present for carry over Access Code: [facility] URL: [email or web address] Date: 05/10/2024 Prepared by: Angela Payseur  Exercises - Supine Posterior Pelvic Tilt  - 1 x daily - 7 x weekly - 3 sets - 10 reps - Supine Bridge  - 2 x daily - 7 x weekly - 1 sets - 10 reps - Supine Lower Trunk Rotation  - 2 x daily - 7 x weekly - 1 sets - 10 reps - Supine Hamstring Stretch with Strap  - 2 x daily - 7 x weekly - 2 sets - 5 reps - 10 hold - Supine March  - 2 x daily - 7 x weekly - 2 sets - 10 reps - Hooklying Abduction with Resistance  - 2 x daily - 7 x weekly - 1 sets - 10 reps  PROM BIL LE and trunk Nustep L 5   05/05/24 BP 123/59 HR 80 Bike L 3 x 6 min Goals Sit to stands 2x10 LE on thighs  LAQ 3lb 2x10 HS curls green 2x10 Passive stretching to bilateral HS, piriformis, Glute, ITB  04/14/24 Nustep L 6 Resisted gait 30# 4 ways 4 x each 10# cable row 10 x 5# cable shld ext 10x 5# cable hip 4 way 10 x- pain and weakness noted RT LB/glut with ex. Left side weakness with ext and abd  but no pain Passive stretching to bilateral HS, piriformis, Glute, ITB   04/12/24 Nustep L 6 7 min STS 5 x 14.2 sec, assessed goals Knee ext 5# 2 sets 10 HS curl 20# 2 sets 10 STS with wt ball press 10x then 10 x OH Cable pulley shld ext 5# 2 sets 10 Resisted trunk ext 20 x Leg Press 30# 2 sets 10 Passive stretching to bilateral HS, piriformis, Glute, ITB    03/22/24 Bike L 3x 5 min Goals  5x sit tot stand 22.23 sec Rows & Ext green 2x10 Passive stretching to bilateral HS, piriformis, Glute, ITB Gait 127ft ~ 52.41 seconds to complete  03/08/24 NuStep L 5 x 6 min  HS curls 20lb 2x10 Leg Ext 5lb 2x10 Rows  & Ext green 2x10 Passive stretching to bilateral HS, piriformis, Glute, ITB  02/16/24 NuStep Leg ext HS curls  STS on airex Walking on beam  Step ups   02/11/24 NuStep L5x49mins Shoulder ext 5# 2x10 Step ups 4  Feet on ball rotations and knees to chest x10 Bridges 2x10 Passive stretching HS, glutes, SKTC, LTR STS 2x10   PATIENT EDUCATION:  Education details: POC, HEP, importance of movement Person educated: Patient and Child(ren) Education method: Explanation and Demonstration Education comprehension: verbalized understanding  HOME EXERCISE PROGRAM: Access Code: [email or web address] URL: [email or web address] Date: 01/12/2024 Prepared by: Tanda Sorrow  Exercises - Supine Lower Trunk Rotation  - 1 x daily - 7 x weekly - 2 sets - 10 reps - Seated March  - 1 x daily - 7 x weekly - 2 sets - 10 reps - Seated Hip Abduction with Resistance  - 1 x daily - 7 x weekly - 2 sets - 10 reps - Sit to Stand  - 1 x daily - 7 x weekly - 2 sets - 5 reps - Supine Bridge  - 1 x daily - 7 x weekly - 3 sets - 10 reps - Standing Hip Abduction with Counter Support  - 1 x daily - 7 x weekly - 3 sets - 10 reps  ASSESSMENT:  CLINICAL IMPRESSION: Pt enters doing well. She stated that she doe not do her HEP everyday. Daughter was not present. Posutral cue needed for shoulder Ext and ROWS. SOME tightness in the RLE compared to the R. No pain during session.  Patient is a 78 y.o. female who was seen today for physical therapy evaluation and treatment for balance, gait, low back and hip pain. She is ambulating without a device but is very unsteady and shuffles her feet. She was advised to use the [facility] at the least. Patient has a chronic cough that is pretty bad. Her daughter reports she is not complaint with HEP and spends most of the day sitting on the sofa. She presents with LE weakness and balance deficits that put her at a high fall risk. She states that her back and hip pain prevent her from doing  anything. Patient is very wobbly when going from sit to stand especially after prolonged sitting. She will benefit from skilled PT to address her gait and balance impairments and decrease her hip and back pain to be more mobile.   OBJECTIVE IMPAIRMENTS: Abnormal gait, cardiopulmonary status limiting activity, decreased activity tolerance, decreased balance, decreased coordination, decreased endurance, decreased mobility, difficulty walking, decreased ROM, decreased strength, postural dysfunction, and pain.   ACTIVITY LIMITATIONS: lifting, bending, sitting, standing, squatting, transfers, and locomotion level  PARTICIPATION LIMITATIONS: meal prep, cleaning, laundry, shopping, and community activity  PERSONAL FACTORS:  Age, Behavior pattern, Past/current experiences, Time since onset of injury/illness/exacerbation, and 3+ comorbidities: CHF, ESRD on dialysis, neuropathy, chronic cough are also affecting patient's functional outcome.   REHAB POTENTIAL: Fair    CLINICAL DECISION MAKING: Stable/uncomplicated  EVALUATION COMPLEXITY: Low   GOALS: Goals reviewed with patient? Yes  SHORT TERM GOALS: Target date: 02/04/24  Patient will be independent with initial HEP. Baseline: given 12/24/23 Goal status: 01/07/24 MET  2.  Patient will demonstrate decreased fall risk by scoring < 15 sec on TUG. Baseline: 24s Goal status: 01/07/24 MET   LONG TERM GOALS: Target date: 03/17/24  Patient will be independent with advanced/ongoing HEP to improve outcomes and carryover.  Baseline:  Goal status: ongoing 03/22/24  04/12/24 MET  2.  Patient will be able to ambulate 500' with good safety to access community.  Baseline: not using AD, very unsteady and unable to walk or stand more than 2 mins Goal status: Progressing slow gait 03/08/24, ongoing 03/22/24  progressing 04/12/24, Ongoing 05/05/24  3.  Patient will report 50-75% improvement in R hip and low back pain Baseline: 10/10  Goal status: Progressing 7/10,  Met 03/22/24  MET 04/12/24, back has been hurting 05/05/24  4.  Patient will demonstrate improved functional LE strength as demonstrated by 5xSTS <20s Baseline: 43s elevated mat and UE push off Goal status: Progressing 01/14/24, Progressing 01/21/24,  ongoing 03/21/24  MET 04/12/24  5.  Patient will increase standing and walking tolerance to 10 mins  Baseline: 2-5 mins  Goal status: Ongoing 3 min 03/08/24, ongoing 03/22/24  and 04/12/24, 05/05/24 ongoing, 05/12/24 ongoing    PLAN:  PT FREQUENCY: 2x/week  PT DURATION: 12 weeks  PLANNED INTERVENTIONS: 97110-Therapeutic exercises, 97530- Therapeutic activity, 97112- Neuromuscular re-education, 97535- Self Care, [id number]- Manual therapy, [phone]- Gait training, [phone]- Electrical stimulation (unattended), Patient/Family education, Balance training, Stair training, Dry Needling, Joint mobilization, Spinal mobilization, Cryotherapy, and Moist heat.  PLAN FOR NEXT SESSION:check HEP cpmpliance     Tanda KANDICE Sorrow, PTA 05/12/2024, 11:49 AM

## 2024-05-12 NOTE — Telephone Encounter (Signed)
 First attempt; no answer    Patient states that her ears have been ringing non stop for the past 3 days and she wants to make it stop

## 2024-05-12 NOTE — Telephone Encounter (Signed)
 Copied from CRM [id number]. Topic: Referral - Question >> May 12, 2024  2:57 PM Debby BROCKS wrote:  Reason for CRM: Patient states she was referred an ENT by Dr. Newlin but is not fond of them. She would like to be referred a new ENT

## 2024-05-12 NOTE — Telephone Encounter (Signed)
 I would need to know the ENT Specialist she is not fond of so we don't refer her back there.

## 2024-05-12 NOTE — Telephone Encounter (Signed)
 Noted

## 2024-05-13 NOTE — Telephone Encounter (Signed)
Attempted to contact patient and VM is currently full.

## 2024-05-15 ENCOUNTER — Other Ambulatory Visit (HOSPITAL_COMMUNITY): Payer: Self-pay

## 2024-05-15 ENCOUNTER — Other Ambulatory Visit: Payer: Self-pay | Admitting: Family Medicine

## 2024-05-16 ENCOUNTER — Other Ambulatory Visit (HOSPITAL_COMMUNITY): Payer: Self-pay

## 2024-05-16 ENCOUNTER — Encounter (HOSPITAL_COMMUNITY): Payer: Self-pay

## 2024-05-16 ENCOUNTER — Other Ambulatory Visit: Payer: Self-pay

## 2024-05-16 DIAGNOSIS — Z992 Dependence on renal dialysis: Secondary | ICD-10-CM | POA: Diagnosis not present

## 2024-05-16 DIAGNOSIS — N2581 Secondary hyperparathyroidism of renal origin: Secondary | ICD-10-CM | POA: Diagnosis not present

## 2024-05-16 MED ORDER — APIXABAN 5 MG PO TABS
5.0000 mg | ORAL_TABLET | Freq: Two times a day (BID) | ORAL | 1 refills | Status: DC
  Filled 2024-05-16 – 2024-05-19 (×2): qty 180, 90d supply, fill #0
  Filled 2024-07-19 – 2024-08-05 (×2): qty 180, 90d supply, fill #1

## 2024-05-16 NOTE — Telephone Encounter (Signed)
 Attempted to get more information and mailbox is currently full.

## 2024-05-16 NOTE — Telephone Encounter (Signed)
 Pt requesting new referral for ent. She was seeing [facility] Dr. Genevie and is not happy with his care.

## 2024-05-16 NOTE — Telephone Encounter (Signed)
 Copied from CRM [id number]. Topic: General - Call Back - No Documentation >> May 16, 2024  2:36 PM Olam RAMAN wrote:  Reason for CRM: PT req a c/b for a missed call about a referral for a new ent

## 2024-05-17 ENCOUNTER — Ambulatory Visit: Attending: Emergency Medicine | Admitting: Emergency Medicine

## 2024-05-17 ENCOUNTER — Other Ambulatory Visit (HOSPITAL_COMMUNITY): Payer: Self-pay

## 2024-05-17 ENCOUNTER — Encounter: Payer: Self-pay | Admitting: Emergency Medicine

## 2024-05-17 ENCOUNTER — Other Ambulatory Visit: Payer: Self-pay

## 2024-05-17 VITALS — BP 146/88 | HR 74 | Ht 62.0 in | Wt 169.6 lb

## 2024-05-17 DIAGNOSIS — I5032 Chronic diastolic (congestive) heart failure: Secondary | ICD-10-CM | POA: Diagnosis not present

## 2024-05-17 DIAGNOSIS — I729 Aneurysm of unspecified site: Secondary | ICD-10-CM | POA: Diagnosis not present

## 2024-05-17 DIAGNOSIS — I480 Paroxysmal atrial fibrillation: Secondary | ICD-10-CM

## 2024-05-17 DIAGNOSIS — I10 Essential (primary) hypertension: Secondary | ICD-10-CM

## 2024-05-17 DIAGNOSIS — I2510 Atherosclerotic heart disease of native coronary artery without angina pectoris: Secondary | ICD-10-CM | POA: Diagnosis not present

## 2024-05-17 DIAGNOSIS — N186 End stage renal disease: Secondary | ICD-10-CM | POA: Diagnosis not present

## 2024-05-17 DIAGNOSIS — E118 Type 2 diabetes mellitus with unspecified complications: Secondary | ICD-10-CM | POA: Diagnosis not present

## 2024-05-17 DIAGNOSIS — I959 Hypotension, unspecified: Secondary | ICD-10-CM

## 2024-05-17 MED ORDER — MIDODRINE HCL 10 MG PO TABS
10.0000 mg | ORAL_TABLET | ORAL | 3 refills | Status: AC
  Filled 2024-05-17 – 2024-05-18 (×2): qty 15, 35d supply, fill #0
  Filled 2024-06-29: qty 15, 35d supply, fill #1
  Filled 2024-09-03: qty 15, 35d supply, fill #2

## 2024-05-17 MED ORDER — AMLODIPINE BESYLATE 2.5 MG PO TABS: ORAL_TABLET | ORAL | 3 refills | Status: DC

## 2024-05-17 MED ORDER — AMLODIPINE BESYLATE 2.5 MG PO TABS
ORAL_TABLET | ORAL | 3 refills | Status: DC
  Filled 2024-05-17: qty 40, 70d supply, fill #0

## 2024-05-17 NOTE — Progress Notes (Signed)
 Cardiology Office Note:    Date:  05/17/2024  ID:  Paula [name] , DOB 1945-11-18, MRN [id number] PCP: Delbert Clam, MD  [facility] Providers Cardiologist:  Lynwood Schilling, MD       Patient Profile:       Chief Complaint: 6-week follow-up History of Present Illness:  Paula [name]  is a 78 y.o. 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 She also has a history of HFpEF. Prior Myoview  in 07/2021 was low risk with no evidence of ischemia or prior infarction. PYP scan was ordered around that time as well to rule out cardiac amyloidosis given significant LVH but this was never done. Last Echo in 10/2021 showed LVEF of 70-75% with severe concentric LVH and grade 1 diastolic dysfunction, normal RV function, moderate left atrial enlargement, and no significant valvular disease. She was last seen by Jackee Alberts, NP, in 05/2022 at which time she was doing well from a cardiac standpoint. She presented to the ED on 07/29/2023 from dialysis for further evaluation of generalized weakness and blurred vision. Code STROKE was called in the field. Head CT and brain MRI showed no acute findings. EKG showed rate controlled atrial fibrillation with no acute ST/T changes. Lab work showed mild hypokalemia and anemia but hemoglobin was around baseline. She was felt to be stable for discharge from the ED. She was hospitalized in January 2025 in the setting of community-acquired pneumonia, acute hypoxic respiratory  failure, asthma exacerbation, acute on chronic diastolic heart failure.  She was treated with IV antibiotics, oxygen therapy, and volume removal with hemodialysis.  Echocardiogram showed EF 60 to 65%, normal LV function, no RWMA, severe concentric LVH, trivial mitral valve regurgitation, trivial aortic valve regurgitation, aortic valve sclerosis without evidence of aortic stenosis, no significant change from prior echo.  She was discharged home in stable condition on 08/20/2023.   She had a recent ED visit on 03/29/2024 for concern for low blood pressure.  Reportedly she had been titrating her blood pressure medicines at home.  Noted that she was recently taken off amlodipine  due to hypotension.  Noted compliance with isosorbide , carvedilol , and dialysis.  Previously prescribed midodrine  but is not taking it.  Without any interventions her blood pressure spontaneously improved to 135/64.  Chest x-ray did show very mild pulmonary edema patient was improved without hypoxia, shortness of breath or volume overload.  He was recommended to decrease her Imdur  and follow-up with PCP.  Per medication changes list, discontinued amlodipine  2.5 mg daily, carvedilol  12.5 mg twice daily and decreased isosorbide  from 60 mg to 30 mg daily.  She was last seen for follow-up in clinic on 04/05/2024.  Reports low blood pressure on nondialysis days a blood pressure log showing systolic blood pressures in the 90s to 100s.  She was taken blood pressure medications in the evening after dialysis and blood pressure medicines again in the morning on nondialysis days.  Reported 3 intermittent episodes of chest pain after being discontinued from amlodipine /carvedilol  and amiodarone decreased.  Blood pressure during office visit was well-controlled at 116/68.  Her home blood pressure documentation showed mainly  normal blood pressures with occasionally mildly elevated BPs.  She was started on carvedilol  3.125 mg twice daily instructed to take on  dialysis and nondialysis days.  She was to continue isosorbide  30 mg daily at night.  Per note prior EKG on 04/01/2024 was reviewed after office visit showing controlled coarse atrial fibrillation with heart rate 72 bpm.  There is an attempt to call patient but no answer and unable to leave a voicemail.  EKG was not completed during office visit.  She was recently seen in the ED on 04/15/2024 for complaints of hypotension.  She was hypotensive and hemodialysis, did not give midodrine  and felt lightheaded and experienced near syncope.  Improved after fluids and midodrine .  Feeling much improved after fluids.   Discussed the use of AI scribe software for clinical note transcription with the patient, who gave verbal consent to proceed.  History of Present Illness Paula [name]  is a 78 year old female who presents with blood pressure fluctuations.  She experiences hypotension during dialysis sessions, periodically requiring midodrine  for stabilization. On non-dialysis days, her blood pressure is elevated, with recent readings averaging in the 150s over 90s.  She currently takes carvedilol  twice daily and isosorbide  at nighttime.  She does remain adherent to current medication regimen.  She also remains adherent to her current hemodialysis schedule.  She reports she feels euvolemic.  No dyspnea, orthopnea, PND, LEE.  She denies current chest pain, palpitations, dizziness, or lightheadedness.  She denies symptoms concerning for recurrent atrial fibrillation.   Review of systems:  Please see the history of present illness. All other systems are reviewed and otherwise negative.      Studies Reviewed:    EKG Interpretation Date/Time:  Tuesday May 17 2024 13:52:39 EDT Ventricular Rate:  74 PR Interval:  260 QRS Duration:  68 QT Interval:  384 QTC Calculation: 426 R Axis:   6  Text Interpretation: Sinus rhythm with 1st degree A-V block Low voltage QRS Nonspecific T wave abnormality When  compared with ECG of 29-Mar-2024 15:03, PREVIOUS ECG IS PRESENT Confirmed by Rana Dixon [phone]) on 05/17/2024 10:13:20 PM    Amyloid study with PYP 03/16/2024   Findings are not suggestive of cardiac ATTR amyloidosis. The myocardium was negative for radiotracer uptake.   The visual grade of myocardial uptake relative to the ribs was Grade 0 (No myocardial uptake and normal bone uptake).   Coronary calcium  was present on the attenuation correction CT images. Severe coronary calcifications were present. Coronary calcifications were present in the left anterior descending artery, left circumflex artery and right coronary artery distribution(s).  Echocardiogram 08/17/2023 1. Left ventricular ejection fraction, by estimation, is 60 to 65%. Left  ventricular ejection fraction by 2D MOD biplane is 62.1 %. The left  ventricle has normal function. The left ventricle has no regional wall  motion abnormalities. There is severe  concentric left ventricular hypertrophy. Left ventricular diastolic  function could not be evaluated.   2. Right ventricular systolic function is low normal. The right  ventricular size is normal. Tricuspid regurgitation signal is inadequate  for assessing PA pressure.   3. Left atrial size was moderately dilated.   4. The mitral valve is abnormal. Trivial mitral valve regurgitation.   5. The aortic valve is tricuspid. Aortic valve regurgitation is trivial.  Aortic valve sclerosis/calcification is present, without any evidence of  aortic stenosis.   6. The inferior vena cava is normal in size with greater than 50%  respiratory variability, suggesting right atrial  pressure of 3 mmHg.   Lexiscan  Myoview  07/12/2021   The patient reported dyspnea and nausea during the stress test. Patient also reported vomitng, hypotension and stomach pains.   ECG is normal. ECG rhythm shows normal sinus rhythm. Resting ECG shows no ST-segment deviation.   There were no arrhythmias during  stress. There were no arrhythmias during recovery. ECG was interpretable and without significant changes. The ECG was not diagnostic due to pharmacologic protocol.   Diaphragmatic attenuation artifact was present. Image quality affected due to significant extracardiac activity.   LV perfusion is normal. There is no evidence of ischemia. There is no evidence of infarction.   No evidence of transient ischemic dilation (TID) noted.   Prior study available for comparison from 05/17/2008.   The study is normal. The study is low risk.  Risk Assessment/Calculations:    CHA2DS2-VASc Score = 5   This indicates a 7.2% annual risk of stroke. The patient's score is based upon: CHF History: 0 HTN History: 1 Diabetes History: 1 Stroke History: 0 Vascular Disease History: 0 Age Score: 2 Gender Score: 1    HYPERTENSION CONTROL Vitals:   05/17/24 1346 05/17/24 2215  BP: (!) 154/96 (!) 146/88    The patient's blood pressure is elevated above target today.  In order to address the patient's elevated BP: A new medication was prescribed today.           Physical Exam:   VS:  BP (!) 146/88   Pulse 74   Ht 5' 2 (1.575 m)   Wt 169 lb 9.6 oz (76.9 kg)   LMP  (LMP Unknown)   SpO2 95%   BMI 31.02 kg/m    Wt Readings from Last 3 Encounters:  05/17/24 169 lb 9.6 oz (76.9 kg)  05/03/24 165 lb 12.8 oz (75.2 kg)  04/07/24 166 lb 9.6 oz (75.6 kg)    GEN: Well nourished, well developed in no acute distress NECK: No JVD; No carotid bruits CARDIAC: RRR, no murmurs, rubs, gallops RESPIRATORY:  Clear to auscultation without rales, wheezing or rhonchi  ABDOMEN: Soft, non-tender, non-distended EXTREMITIES:  No edema; No acute deformity      Assessment and Plan:  Hypotension Essential hypertension She has history of hypertension but also hypotension during dialysis requiring as needed midodrine  - Her blood pressure seems to be fairly well-controlled on dialysis days with periodic episodes of  hypotension during dialysis sessions managed with midodrine  - On her nondialysis days her blood pressure does seem to be elevated up to the 140s-150s.  I will have her begin a low-dose amlodipine  2.5 mg in the a.m. on nondialysis days for better blood pressure control - Continue carvedilol  3.125 mg twice daily and isosorbide  30 mg daily at nighttime  Coronary artery calcification Coronary artery calcifications noted on prior CTs.  Severe coronary calcifications present in LAD, LCx and RCA on most recent amyloid study 03/2024 Myoview  07/2021 negative for ischemia - Today she is stable without chest pains.  She denies any anginal symptoms.  No indication for further ischemic evaluation at this time - No ASA in the setting of chronic anticoagulation - Continue carvedilol , isosorbide , and atorvastatin   HFpEF Amyloid study 03/2024 not suggestive of cardiac ATTR amyloidosis Echocardiogram 08/2023 with LVEF 60 to 65%, normal LV function, no RWMA, severe LVH Her volume is managed with dialysis - Today she appears euvolemic and well compensated on exam.  No dyspnea orthopnea, PND, LEE - Continue carvedilol   Paroxysmal atrial fibrillation She has no symptomatic paroxysms -  EKG today shows sinus rhythm with first-degree AV block - Continue carvedilol  and Eliquis   Type 2 diabetes A1c 6.7% on 02/2024 - Managed on glipizide  and linagliptin  per endocrinology  End-stage renal disease On hemodialysis M/W/F - Management per nephrology  Brain aneurysm She has history of left posterior cavernous carotid aneurysm and left supraclinoid ICA aneurysm followed by Dr. Dolphus - Stable on most recent MRA on 10/2023      Dispo:  Return in about 3 months (around 08/17/2024).  Signed, Lum LITTIE Louis, NP

## 2024-05-17 NOTE — Patient Instructions (Addendum)
 Medication Instructions:  START TAKING AMLODIPINE  2.5 MG DAILY.  Lab Work: NONE TO BE DONE TODAY.  Testing/Procedures: NONE  Follow-Up: At [facility], you and your health needs are our priority.  As part of our continuing mission to provide you with exceptional heart care, our providers are all part of one team.  This team includes your primary Cardiologist (physician) and Advanced Practice Providers or APPs (Physician Assistants and Nurse Practitioners) who all work together to provide you with the care you need, when you need it.  Your next appointment:   August 18, 2024 AT 12:20 PM  Provider:   Lynwood Schilling, MD

## 2024-05-18 ENCOUNTER — Other Ambulatory Visit: Payer: Self-pay

## 2024-05-18 ENCOUNTER — Other Ambulatory Visit (HOSPITAL_COMMUNITY): Payer: Self-pay

## 2024-05-18 DIAGNOSIS — N186 End stage renal disease: Secondary | ICD-10-CM | POA: Diagnosis not present

## 2024-05-18 NOTE — Therapy (Signed)
 OUTPATIENT PHYSICAL THERAPY THORACOLUMBAR     Patient Name: Paula [name]  MRN: [id number] DOB:03-27-1946, 78 y.o., female Today's Date: 05/19/2024  END OF SESSION:  PT End of Session - 05/19/24 1142     Visit Number 16    Date for Recertification  06/04/24    PT Start Time 1143    PT Stop Time 1225    PT Time Calculation (min) 42 min    Activity Tolerance Patient tolerated treatment well    Behavior During Therapy [facility] for tasks assessed/performed              Past Medical History:  Diagnosis Date   Allergic rhinitis    Allergy  1/12000   Arthritis    Asthma    Brain aneurysm    Cough    Diabetes mellitus    Diastolic CHF, chronic (HCC) 10/11/2011   ESRD on hemodialysis (HCC)    MWF   GERD (gastroesophageal reflux disease)    History of colon polyps 2012   tubular adenoma    Hyperlipidemia    Hypertension    Neuropathy 10/11/2011   PONV (postoperative nausea and vomiting)    one time after lymph node surgery   Past Surgical History:  Procedure Laterality Date   AV FISTULA PLACEMENT Right 10/15/2021   Procedure: ARTERIOVENOUS (AV) FISTULA  WITH PLACEMENT OF GORE-TEX STRETCH  GRAFT (4-62mmx45cm);  Surgeon: Eliza Lonni RAMAN, MD;  Location: [facility] OR;  Service: Vascular;  Laterality: Right;   CATARACT EXTRACTION     right eye   IR 3D INDEPENDENT WKST  12/14/2017   IR ANGIO INTRA EXTRACRAN SEL COM CAROTID INNOMINATE BILAT MOD SED  09/15/2017   IR ANGIO INTRA EXTRACRAN SEL COM CAROTID INNOMINATE UNI L MOD SED  12/14/2017   IR ANGIO VERTEBRAL SEL VERTEBRAL BILAT MOD SED  09/15/2017   IR RADIOLOGIST EVAL & MGMT  09/10/2017   IR RADIOLOGIST EVAL & MGMT  10/19/2017   lymphatic mass surgery     NASAL TURBINATE REDUCTION     RADIOLOGY WITH ANESTHESIA N/A 12/14/2017   Procedure: RADIOLOGY WITH ANESTHESIA EMBOLIZATION;  Surgeon: Dolphus Carrion, MD;  Location: [facility] OR;  Service: Radiology;  Laterality: N/A;   VIDEO BRONCHOSCOPY Bilateral 11/18/2017   Procedure:  VIDEO BRONCHOSCOPY WITHOUT FLUORO;  Surgeon: Shelah Lamar RAMAN, MD;  Location: [facility] ENDOSCOPY;  Service: Cardiopulmonary;  Laterality: Bilateral;   Patient Active Problem List   Diagnosis Date Noted   Hypoxemia 12/29/2023   Abnormal electrocardiogram (ECG) (EKG) 12/29/2023   Left ventricular hypertrophy 12/29/2023   CHF (congestive heart failure) (HCC) 10/12/2023   Blurry vision 10/11/2023   Anemia due to chronic kidney disease 10/11/2023   Acute respiratory failure with hypoxia (HCC) 08/17/2023   PNA (pneumonia) 08/17/2023   Volume overload 08/17/2023   Acute hypoxic respiratory failure (HCC) 08/17/2023   SNHL (sensory-neural hearing loss), 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   ESRD on dialysis (HCC) 11/20/2022   Mold exposure 08/14/2022   Opioid withdrawal (HCC) 08/14/2022   ILD (interstitial lung disease) (HCC) 08/14/2022   Secondary hyperparathyroidism, renal 08/14/2022   Paroxysmal A-fib (HCC) 05/20/2022   Hypertrophy of nasal turbinates 09/05/2021   Chronic kidney disease, stage 5 (HCC)    Aortic atherosclerosis 06/24/2021   Chronic rhinitis 10/01/2017   Seasonal allergic conjunctivitis 10/01/2017   Aneurysm 09/24/2017   Acute metabolic encephalopathy 09/02/2017   Allergic rhinitis 08/20/2017   Palpitations 07/10/2016   Gout 01/23/2016   History of cataract 02/27/2015   Seasonal and perennial allergic rhinitis 02/15/2012   Chronic diastolic  CHF (congestive heart failure) (HCC) 10/11/2011   GERD (gastroesophageal reflux disease)    Type 2 diabetes mellitus without complication, without long-term current use of insulin  (HCC) 07/03/2010   Hyperlipidemia 07/03/2010   Essential hypertension 07/03/2010    PCP: Corrina Sabin  REFERRING PROVIDER: Corrina Sabin  REFERRING DIAG: M54.50,G89.29 (ICD-10-CM) - Chronic right-sided low back pain without sciaticaM25.551 (ICD-10-CM) - Right hip pain  Rationale for Evaluation and Treatment: Rehabilitation  THERAPY DIAG:  Muscle weakness  (generalized)  Difficulty in walking, not elsewhere classified  Other low back pain  Pain in right hip  Other abnormalities of gait and mobility  ONSET DATE: 10/11/23  SUBJECTIVE:                                                                                                                                                                                           SUBJECTIVE STATEMENT: I feel okay.   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   She experiences pain in the lower back, specifically in the right hip and lower back, which makes standing for more than two minutes difficult. The pain is localized to the hip area without radiation down the leg. She is receiving physical therapy at home and is interested in outpatient therapy. Tylenol  and a heating pad provide pain relief. Hip x-ray from 2024 revealed degenerative changes send pubic symphysis, both hips, SI joints and lower lumbar spine.  She fell from her bed last Monday, resulting in pain on the left side of her head and right shoulder, which was swollen but has improved. No current swelling or pain with pressure. She experiences dizziness at times and uses a cane occasionally.    PAIN:  Are you having pain? Yes: NPRS scale: 0/10 low back pain Pain location: R hip and midline low back  Pain description: not there all the time, if I am sitting I am okay Aggravating factors: when I get up, when I am standing, walking  Relieving  factors: Tylenol  and gabapentin ,   PRECAUTIONS: Fall  RED FLAGS: None   WEIGHT BEARING RESTRICTIONS: No  FALLS:  Has patient fallen in last 6 months? Yes. Number of falls 2 knee gave out in March, then  1 time fell out of the bed   LIVING ENVIRONMENT: Lives with: lives with their family Lives in: House/apartment Stairs: Yes: Internal: 14 steps; on right going up and the wall on the other side Has following equipment at home: Single point cane and Walker - 2 wheeled  OCCUPATION: N/A  PLOF: Independent with basic ADLs  PATIENT GOALS: to get rid of the pain, be able to walk and stand for longer than 2 mins without the pain  NEXT MD VISIT: 03/01/24  OBJECTIVE:  Note: Objective measures were completed at Evaluation unless otherwise noted.  DIAGNOSTIC FINDINGS:  X ray ordered for hips, lumbar, and knees   COGNITION: Overall cognitive status: Within functional limits for tasks assessed     SENSATION: WFL  POSTURE: rounded shoulders, forward head, and increased thoracic kyphosis  PALPATION: TTP and pain lumbar spine   LUMBAR ROM:   AROM eval 01/14/24 01/21/24 03/08/24 03/21/24  Flexion Distal shin with pain [facility]   WFL  Extension 25% available Limited 25% Limited 25% [facility] WFL  Right lateral flexion Mid thigh with pain Limited 25% Limited 25% 25% limited  WFL  Left lateral flexion Mid thigh with pain WFL   WFL  Right rotation 75% available with pain [facility]   WFL  Left rotation 75% available with pain WFL   WFL   (Blank rows = not tested)  LOWER EXTREMITY ROM:   grossly WFL   LOWER EXTREMITY MMT:  grossly 4-/5 BLE    FUNCTIONAL TESTS:  5 times sit to stand: 43s from elevated mat and needing UE push off, loses balance on to heels  Timed up and go (TUG): 24s   GAIT: Distance walked: in clinic distances Assistive device utilized: None Level of assistance: CGA Comments: furniture walking, or reaches out to grab daughter's hand to walk  TREATMENT DATE:  05/19/24 SPO2  95% NuStep L5x41mins Leg ext 5# 2x12 HS curls 20# 2x12 Step ups 4  STS chest press with red ball 2x10 Passive stretching to bilateral LE Piriformis, HS, ITB, K2C, Double K2C, lower trunk rotation  05/12/24 NuStep L5 x 6 min HS cutls 20lb 2x10 Leg Ext 5lb 2x10 Sit to stand holding yellow ball 2x10 Seated rows green 2x10 Shoulder Ext green 2x10 Passive stretching to bilateral LE  Piriformis, HS, ITB, K2C  Double K2C, lower trunk rotation   05/10/24 Focus session on est new HEP she can do in bed to hoepfully increase compliance. Daughter present for carry over Access Code: [facility] URL: [email or web address] Date: 05/10/2024 Prepared by: Angela Payseur  Exercises - Supine Posterior Pelvic Tilt  - 1 x daily - 7 x weekly - 3 sets - 10 reps - Supine Bridge  - 2 x daily - 7 x weekly - 1 sets - 10 reps - Supine Lower Trunk Rotation  - 2 x daily - 7 x weekly - 1 sets - 10 reps - Supine Hamstring Stretch with Strap  - 2 x daily - 7 x weekly - 2 sets - 5 reps - 10 hold - Supine March  - 2 x daily - 7 x weekly - 2 sets - 10 reps - Hooklying Abduction with Resistance  - 2 x daily - 7 x weekly - 1 sets - 10 reps  PROM BIL LE and trunk Nustep L 5   05/05/24 BP 123/59 HR 80 Bike L 3 x 6 min Goals Sit to stands 2x10 LE on thighs  LAQ 3lb 2x10 HS curls green 2x10 Passive stretching to bilateral  HS, piriformis, Glute, ITB  04/14/24 Nustep L 6 Resisted gait 30# 4 ways 4 x each 10# cable row 10 x 5# cable shld ext 10x 5# cable hip 4 way 10 x- pain and weakness noted RT LB/glut with ex. Left side weakness with ext and abd but no pain Passive stretching to bilateral HS, piriformis, Glute, ITB   04/12/24 Nustep L 6 7 min STS 5 x 14.2 sec, assessed goals Knee ext 5# 2 sets 10 HS curl 20# 2 sets 10 STS with wt ball press 10x then 10 x OH Cable pulley shld ext 5# 2 sets 10 Resisted trunk ext 20 x Leg Press 30# 2 sets 10 Passive stretching to bilateral HS,  piriformis, Glute, ITB    03/22/24 Bike L 3x 5 min Goals  5x sit tot stand 22.23 sec Rows & Ext green 2x10 Passive stretching to bilateral HS, piriformis, Glute, ITB Gait 170ft ~ 52.41 seconds to complete  03/08/24 NuStep L 5 x 6 min  HS curls 20lb 2x10 Leg Ext 5lb 2x10 Rows & Ext green 2x10 Passive stretching to bilateral HS, piriformis, Glute, ITB  02/16/24 NuStep Leg ext HS curls  STS on airex Walking on beam  Step ups   02/11/24 NuStep L5x50mins Shoulder ext 5# 2x10 Step ups 4  Feet on ball rotations and knees to chest x10 Bridges 2x10 Passive stretching HS, glutes, SKTC, LTR STS 2x10   PATIENT EDUCATION:  Education details: POC, HEP, importance of movement Person educated: Patient and Child(ren) Education method: Explanation and Demonstration Education comprehension: verbalized understanding  HOME EXERCISE PROGRAM: Access Code: [email or web address] URL: [email or web address] Date: 01/12/2024 Prepared by: Tanda Sorrow  Exercises - Supine Lower Trunk Rotation  - 1 x daily - 7 x weekly - 2 sets - 10 reps - Seated March  - 1 x daily - 7 x weekly - 2 sets - 10 reps - Seated Hip Abduction with Resistance  - 1 x daily - 7 x weekly - 2 sets - 10 reps - Sit to Stand  - 1 x daily - 7 x weekly - 2 sets - 5 reps - Supine Bridge  - 1 x daily - 7 x weekly - 3 sets - 10 reps - Standing Hip Abduction with Counter Support  - 1 x daily - 7 x weekly - 3 sets - 10 reps  ASSESSMENT:  CLINICAL IMPRESSION: Pt enters doing well. She state she is doing more exercises at home. Tightness in the RLE compared to the L side. No pain during session. Brought in oxygen today but did not need to use it during session.   Patient is a 78 y.o. female who was seen today for physical therapy evaluation and treatment for balance, gait, low back and hip pain. She is ambulating without a device but is very unsteady and shuffles her feet. She was advised to use the [facility] at the least. Patient  has a chronic cough that is pretty bad. Her daughter reports she is not complaint with HEP and spends most of the day sitting on the sofa. She presents with LE weakness and balance deficits that put her at a high fall risk. She states that her back and hip pain prevent her from doing anything. Patient is very wobbly when going from sit to stand especially after prolonged sitting. She will benefit from skilled PT to address her gait and balance impairments and decrease her hip and back pain to be more mobile.   OBJECTIVE IMPAIRMENTS: Abnormal gait, cardiopulmonary  status limiting activity, decreased activity tolerance, decreased balance, decreased coordination, decreased endurance, decreased mobility, difficulty walking, decreased ROM, decreased strength, postural dysfunction, and pain.   ACTIVITY LIMITATIONS: lifting, bending, sitting, standing, squatting, transfers, and locomotion level  PARTICIPATION LIMITATIONS: meal prep, cleaning, laundry, shopping, and community activity  PERSONAL FACTORS: Age, Behavior pattern, Past/current experiences, Time since onset of injury/illness/exacerbation, and 3+ comorbidities: CHF, ESRD on dialysis, neuropathy, chronic cough are also affecting patient's functional outcome.   REHAB POTENTIAL: Fair    CLINICAL DECISION MAKING: Stable/uncomplicated  EVALUATION COMPLEXITY: Low   GOALS: Goals reviewed with patient? Yes  SHORT TERM GOALS: Target date: 02/04/24  Patient will be independent with initial HEP. Baseline: given 12/24/23 Goal status: 01/07/24 MET  2.  Patient will demonstrate decreased fall risk by scoring < 15 sec on TUG. Baseline: 24s Goal status: 01/07/24 MET   LONG TERM GOALS: Target date: 03/17/24  Patient will be independent with advanced/ongoing HEP to improve outcomes and carryover.  Baseline:  Goal status: ongoing 03/22/24  04/12/24 MET  2.  Patient will be able to ambulate 500' with good safety to access community.  Baseline: not using  AD, very unsteady and unable to walk or stand more than 2 mins Goal status: Progressing slow gait 03/08/24, ongoing 03/22/24  progressing 04/12/24, Ongoing 05/05/24  3.  Patient will report 50-75% improvement in R hip and low back pain Baseline: 10/10  Goal status: Progressing 7/10, Met 03/22/24  MET 04/12/24, back has been hurting 05/05/24, back has not hurt, it is better 05/19/24 MET  4.  Patient will demonstrate improved functional LE strength as demonstrated by 5xSTS <20s Baseline: 43s elevated mat and UE push off Goal status: Progressing 01/14/24, Progressing 01/21/24,  ongoing 03/21/24  MET 04/12/24  5.  Patient will increase standing and walking tolerance to 10 mins  Baseline: 2-5 mins  Goal status: Ongoing 3 min 03/08/24, ongoing 03/22/24  and 04/12/24, 05/05/24 ongoing, 05/12/24 ongoing    PLAN:  PT FREQUENCY: 2x/week  PT DURATION: 12 weeks  PLANNED INTERVENTIONS: 97110-Therapeutic exercises, 97530- Therapeutic activity, 97112- Neuromuscular re-education, 97535- Self Care, [id number]- Manual therapy, [phone]- Gait training, [phone]- Electrical stimulation (unattended), Patient/Family education, Balance training, Stair training, Dry Needling, Joint mobilization, Spinal mobilization, Cryotherapy, and Moist heat.  PLAN FOR NEXT SESSION: check HEP cpmpliance     Almetta Fam, PT 05/19/2024, 12:24 PM

## 2024-05-18 NOTE — Telephone Encounter (Signed)
 New referral is needed for patient per Alt[employer].

## 2024-05-18 NOTE — Telephone Encounter (Signed)
 Altamese: I have submitted new ENT referral. She does not want to see Dr. Genevie.

## 2024-05-18 NOTE — Addendum Note (Signed)
 Addended by: VICCI SOBER B on: 05/18/2024 09:20 AM   Modules accepted: Orders

## 2024-05-19 ENCOUNTER — Other Ambulatory Visit (HOSPITAL_COMMUNITY): Payer: Self-pay

## 2024-05-19 ENCOUNTER — Ambulatory Visit

## 2024-05-19 ENCOUNTER — Other Ambulatory Visit: Payer: Self-pay

## 2024-05-19 DIAGNOSIS — M25551 Pain in right hip: Secondary | ICD-10-CM | POA: Diagnosis not present

## 2024-05-19 DIAGNOSIS — R262 Difficulty in walking, not elsewhere classified: Secondary | ICD-10-CM | POA: Diagnosis not present

## 2024-05-19 DIAGNOSIS — M6281 Muscle weakness (generalized): Secondary | ICD-10-CM | POA: Diagnosis not present

## 2024-05-19 DIAGNOSIS — R2689 Other abnormalities of gait and mobility: Secondary | ICD-10-CM | POA: Diagnosis not present

## 2024-05-19 DIAGNOSIS — M5459 Other low back pain: Secondary | ICD-10-CM

## 2024-05-27 ENCOUNTER — Encounter: Payer: Self-pay | Admitting: Cardiology

## 2024-05-27 DIAGNOSIS — Z992 Dependence on renal dialysis: Secondary | ICD-10-CM | POA: Diagnosis not present

## 2024-05-27 DIAGNOSIS — N186 End stage renal disease: Secondary | ICD-10-CM | POA: Diagnosis not present

## 2024-05-27 DIAGNOSIS — N2581 Secondary hyperparathyroidism of renal origin: Secondary | ICD-10-CM | POA: Diagnosis not present

## 2024-05-30 DIAGNOSIS — N186 End stage renal disease: Secondary | ICD-10-CM | POA: Diagnosis not present

## 2024-06-02 ENCOUNTER — Ambulatory Visit (INDEPENDENT_AMBULATORY_CARE_PROVIDER_SITE_OTHER)

## 2024-06-02 ENCOUNTER — Other Ambulatory Visit (HOSPITAL_COMMUNITY): Payer: Self-pay

## 2024-06-02 ENCOUNTER — Other Ambulatory Visit: Payer: Self-pay

## 2024-06-02 VITALS — BP 164/72 | HR 73 | Ht 62.0 in | Wt 164.0 lb

## 2024-06-02 DIAGNOSIS — H9313 Tinnitus, bilateral: Secondary | ICD-10-CM

## 2024-06-02 DIAGNOSIS — H6123 Impacted cerumen, bilateral: Secondary | ICD-10-CM

## 2024-06-02 DIAGNOSIS — J300 Vasomotor rhinitis: Secondary | ICD-10-CM

## 2024-06-02 MED ORDER — IPRATROPIUM BROMIDE 0.03 % NA SOLN
2.0000 | Freq: Two times a day (BID) | NASAL | 12 refills | Status: AC
  Filled 2024-06-02: qty 30, 75d supply, fill #0
  Filled 2024-06-29 – 2024-09-03 (×2): qty 30, 75d supply, fill #1

## 2024-06-02 NOTE — Progress Notes (Unsigned)
 HPI:   Paula [name]  is a 78 y.o. female who presents as a new patient being seen in consultation for nasal drainage and tinnitus.  Patient's daughter is present during the exam and providing history as well.  Patient states that she had severe ringing in the right ear that started 4 weeks ago followed by mild ringing that started in the left ear 2 to 3 days ago.  Patient states that the right ear ringing has improved slightly.  Patient's daughter states that she has seen a multitude of ENT doctors regarding her several complaints.  Patient states that she had a hearing test 8 months ago that did not show any hearing loss.  Patient is also complaining of a chronic cough for the past 20 years.  Patient states that she has significant postnasal drainage which she feels bothers her most at night.  She states that this is consistent all year.  She states that she is currently taking Flonase , Astelin , Mucinex , saline spray, Claritin , and Benadryl .  She states that none of these have provided any relief.  She states that she has not been on any recent antibiotics.  She does not have any change in smell.  She does not like strong smells however as these cause her to cough.  She states she has been seen by an allergist and has allergies to dust mites.   PMH/Meds/All/SocHx/FamHx/ROS: Past Medical History:  Diagnosis Date   Allergic rhinitis    Allergy  1/12000   Arthritis    Asthma    Brain aneurysm    Cough    Diabetes mellitus    Diastolic CHF, chronic (HCC) 10/11/2011   ESRD on hemodialysis (HCC)    MWF   GERD (gastroesophageal reflux disease)    History of colon polyps 2012   tubular adenoma    Hyperlipidemia    Hypertension    Neuropathy 10/11/2011   PONV (postoperative nausea and vomiting)    one time after lymph node surgery   Past Surgical History:  Procedure Laterality Date   AV FISTULA PLACEMENT Right 10/15/2021   Procedure: ARTERIOVENOUS (AV) FISTULA  WITH PLACEMENT OF GORE-TEX  STRETCH  GRAFT (4-55mmx45cm);  Surgeon: Eliza Lonni RAMAN, MD;  Location: [facility] OR;  Service: Vascular;  Laterality: Right;   CATARACT EXTRACTION     right eye   IR 3D INDEPENDENT WKST  12/14/2017   IR ANGIO INTRA EXTRACRAN SEL COM CAROTID INNOMINATE BILAT MOD SED  09/15/2017   IR ANGIO INTRA EXTRACRAN SEL COM CAROTID INNOMINATE UNI L MOD SED  12/14/2017   IR ANGIO VERTEBRAL SEL VERTEBRAL BILAT MOD SED  09/15/2017   IR RADIOLOGIST EVAL & MGMT  09/10/2017   IR RADIOLOGIST EVAL & MGMT  10/19/2017   lymphatic mass surgery     NASAL TURBINATE REDUCTION     RADIOLOGY WITH ANESTHESIA N/A 12/14/2017   Procedure: RADIOLOGY WITH ANESTHESIA EMBOLIZATION;  Surgeon: Dolphus Carrion, MD;  Location: [facility] OR;  Service: Radiology;  Laterality: N/A;   VIDEO BRONCHOSCOPY Bilateral 11/18/2017   Procedure: VIDEO BRONCHOSCOPY WITHOUT FLUORO;  Surgeon: Shelah Lamar RAMAN, MD;  Location: [facility]COPY;  Service: Cardiopulmonary;  Laterality: Bilateral;   No family history of bleeding disorders, wound healing problems or difficulty with anesthesia.  Social Connections: Unknown (03/01/2024)   Social Connection and Isolation Panel    Frequency of Communication with Friends and Family: More than three times a week    Frequency of Social Gatherings with Friends and Family: More than three times a week  Attends Religious Services: More than 4 times per year    Active Member of Clubs or Organizations: Patient declined    Attends Banker Meetings: Not on file    Marital Status: Widowed    Current Outpatient Medications:    ACCU-CHEK GUIDE TEST test strip, USE 1 STRIP TO CHECK GLUCOSE TWICE DAILY, Disp: 100 each, Rfl: 0   acetaminophen  (TYLENOL ) 500 MG tablet, Take 500 mg by mouth every 6 (six) hours as needed for fever, moderate pain or mild pain., Disp: , Rfl:    albuterol  (PROAIR  HFA) 108 (90 Base) MCG/ACT inhaler, Inhale 1-2 puffs into the lungs every 6 (six) hours as needed for wheezing or shortness  of breath., Disp: 6.7 g, Rfl: 2   albuterol  (PROVENTIL ) (2.5 MG/3ML) 0.083% nebulizer solution, Take 3 mLs (2.5 mg total) by nebulization every 6 (six) hours as needed for wheezing or shortness of breath., Disp: 75 mL, Rfl: 1   amLODipine  (NORVASC ) 2.5 MG tablet, TAKE 2.5 MG (1 TABLET) ON NON DIALYSIS DAYS., Disp: 40 tablet, Rfl: 3   apixaban  (ELIQUIS ) 5 MG TABS tablet, Take 1 tablet (5 mg total) by mouth 2 (two) times daily., Disp: 180 tablet, Rfl: 1   atorvastatin  (LIPITOR ) 80 MG tablet, Take 1 tablet (80 mg total) by mouth daily., Disp: 90 tablet, Rfl: 1   azelastine  (ASTELIN ) 0.1 % nasal spray, Place 2 sprays into both nostrils 2 (two) times daily. Use in each nostril as directed, Disp: 30 mL, Rfl: 12   calcium  acetate (PHOSLO ) 667 MG capsule, Take 1 capsule (667 mg total) by mouth 3 (three) times daily with meals., Disp: 270 capsule, Rfl: 2   carvedilol  (COREG ) 3.125 MG tablet, Take 1 tablet (3.125 mg total) by mouth 2 (two) times daily., Disp: 180 tablet, Rfl: 3   fluticasone  (FLONASE ) 50 MCG/ACT nasal spray, Place 2 sprays into both nostrils daily., Disp: 16 g, Rfl: 12   fluticasone -salmeterol (ADVAIR ) 100-50 MCG/ACT AEPB, Inhale 1 puff into the lungs 2 (two) times daily., Disp: 60 each, Rfl: 2   gabapentin  (NEURONTIN ) 100 MG capsule, Take 1 capsule (100 mg total) by mouth 3 (three) times daily., Disp: 270 capsule, Rfl: 1   glipiZIDE  (GLUCOTROL  XL) 2.5 MG 24 hr tablet, Take 1 tablet (2.5 mg total) by mouth daily with breakfast., Disp: 90 tablet, Rfl: 3   Homeopathic Products (EARACHE DROPS OT), Place 1 drop into both ears daily as needed (for ear aches)., Disp: , Rfl:    isosorbide  mononitrate (IMDUR ) 30 MG 24 hr tablet, Take 1 tablet (30 mg total) by mouth at bedtime. Do not take on dialysis days., Disp: 90 tablet, Rfl: 0   lidocaine -prilocaine  (EMLA ) cream, Apply a small amount to dialysis access one hour prior to dialysis. Wrap with plastic wrap., Disp: 30 g, Rfl: PRN   linagliptin   (TRADJENTA ) 5 MG TABS tablet, Take 1 tablet (5 mg total) by mouth daily., Disp: 90 tablet, Rfl: 3   loratadine  (CLARITIN ) 10 MG tablet, Take 1 tablet (10 mg total) by mouth daily., Disp: 90 tablet, Rfl: 1   midodrine  (PROAMATINE ) 10 MG tablet, Take 1 tablet (10 mg total) by mouth 3 (three) times a week. Take 30 minutes prior to HD, Disp: 15 tablet, Rfl: 3   Misc. Devices MISC, [employer].  Diagnosis - unstable gait, Disp: 1 each, Rfl: 0   Misc. Devices MISC, Fistula sleeve for R arm.  Diagnoses: End-stage renal disease on hemodialysis, Disp: 1 each, Rfl: 0   Misc. Devices MISC, 1. Incontinence  supplies.  2. Briefs.  Diagnosis-urinary incontinence, Disp: 1 each, Rfl: 11   Misc. Devices MISC, Nepro dialysis protein drink.  Diagnosis end-stage renal disease (Patient taking differently: Take 10 g by mouth See admin instructions. Nepro dialysis protein drink (Diagnosis end-stage renal disease)- Mix 10 grams of powder and mix into food daily as needed for low protein), Disp: 30 each, Rfl: 11   Misc. Devices MISC, Blood pressure monitor.  Diagnosis hypertension, Disp: 1 each, Rfl: 0   Misc. Devices MISC, Portable oxygen concentrator, 2 L of oxygen.  Diagnosis chronic respiratory failure., Disp: 1 each, Rfl: 0   olopatadine  (PATANOL) 0.1 % ophthalmic solution, Place 1 drop into both eyes 2 (two) times daily., Disp: 5 mL, Rfl: 12   ondansetron  (ZOFRAN ) 4 MG tablet, Take 4 mg by mouth every 8 (eight) hours as needed for nausea or vomiting., Disp: , Rfl:    Throat Lozenges (HALLS COUGH DROPS MT), Use as directed 1 lozenge in the mouth or throat every 2 (two) hours as needed (for coughing)., Disp: , Rfl:    tiZANidine  (ZANAFLEX ) 4 MG tablet, Take 1 tablet (4 mg total) by mouth every 8 (eight) hours as needed for muscle spasms., Disp: 60 tablet, Rfl: 1 A complete ROS was performed with pertinent positives/negatives noted in the HPI. The remainder of the ROS are negative.   Physical Exam:  BP (!) 164/72 (BP Location:  Left Arm, Patient Position: Sitting, Cuff Size: Normal) Comment: DAUGHTER STATED THIS IS A NORMAL bp  Pulse 73   Ht 5' 2 (1.575 m)   Wt 164 lb (74.4 kg)   LMP  (LMP Unknown)   SpO2 (!) 89%   BMI 30.00 kg/m  General: Well developed, well nourished. No acute distress. Voice normal Head/Face: Normocephalic. No sinus tenderness. Facial nerve intact and equal bilaterally. No facial lacerations. Eyes: PERRL, no scleral icterus or conjunctival hemorrhage. EOMI. Ears: No gross deformity. Cerumen impaction bilaterally. Tympanic membrane in tact bilaterally Hearing: Normal speech reception.  Nose: No gross deformity or lesions. No purulent discharge. No turbinate hypertrophy. Mouth/Oropharynx: Lips without any lesions. Dentition good. No mucosal lesions within the oropharynx. No tonsillar enlargement, exudate, or lesions. Pharyngeal walls symmetrical. Uvula midline. Tongue midline without lesions. Larynx: See TFL if applicable Nasopharynx: See TFL if applicable Neck: Trachea midline. No masses. No thyromegaly or nodules palpated. No crepitus. Lymphatic: No lymphadenopathy in the neck. Respiratory: No stridor or distress. Room air. Cardiovascular: Regular rate and rhythm. Extremities: No edema or cyanosis. Warm and well-perfused. Skin: No scars or lesions on face or neck. Neurologic: CN II-XII grossly intact. Moving all extremities without gross abnormality. Other:  Independent Review of Additional Tests or Records: None Procedures: Procedure: Bilateral ear microscopy and cerumen removal using microscope (CPT [id number]) - Mod 50 Pre-procedure diagnosis: bilateral cerumen impaction external auditory canals Post-procedure diagnosis: same Indication: bilateral cerumen impaction; given patient's otologic complaints and history as well as for improved and comprehensive examination of external ear and tympanic membrane, bilateral otologic examination using microscope was performed and impacted cerumen  removed  Procedure: Patient was placed semi-recumbent. Both ear canals were examined using the microscope with findings above. Cerumen removed from bilateral external auditory canals using suction and currette with improvement in EAC examination and patency. Left: EAC was patent. TM was intact . Middle ear was aerated. Drainage: none Right: EAC was patent. TM was intact . Middle ear was aerated . Drainage: none Patient tolerated the procedure well.   Impression & Plans: Paula [name]  is a  78 y.o. female with nasal drainage and tinnitus.  Tinnitus, bilateral right greater than left - Cerumen impaction bilaterally - Cerumen removal under microscope with instrumentation today - Patient states after cerumen removal that her tinnitus is significantly improved - Recommend Debrox weekly, return for cerumen removal every 6 months if needed  Vasomotor rhinitis - Stop current nasal medications as patient feels these are not helping at all - Start Atrovent  twice daily - Consider nasal endoscopy at next visit  Follow-up in 4 to 6 weeks  Adah Malkin, DO [facility]

## 2024-06-03 ENCOUNTER — Other Ambulatory Visit (HOSPITAL_COMMUNITY): Payer: Self-pay

## 2024-06-03 ENCOUNTER — Other Ambulatory Visit: Payer: Self-pay

## 2024-06-03 DIAGNOSIS — N186 End stage renal disease: Secondary | ICD-10-CM | POA: Diagnosis not present

## 2024-06-03 DIAGNOSIS — N2581 Secondary hyperparathyroidism of renal origin: Secondary | ICD-10-CM | POA: Diagnosis not present

## 2024-06-03 DIAGNOSIS — Z992 Dependence on renal dialysis: Secondary | ICD-10-CM | POA: Diagnosis not present

## 2024-06-06 ENCOUNTER — Telehealth (INDEPENDENT_AMBULATORY_CARE_PROVIDER_SITE_OTHER): Payer: Self-pay

## 2024-06-06 NOTE — Telephone Encounter (Signed)
 Pt called with questions about the medication and would like a call back

## 2024-06-06 NOTE — Telephone Encounter (Signed)
 Pt called with questions about her medication let dharap know she is going to call.

## 2024-06-07 ENCOUNTER — Ambulatory Visit: Admitting: Physical Therapy

## 2024-06-08 DIAGNOSIS — N186 End stage renal disease: Secondary | ICD-10-CM | POA: Diagnosis not present

## 2024-06-08 DIAGNOSIS — N2581 Secondary hyperparathyroidism of renal origin: Secondary | ICD-10-CM | POA: Diagnosis not present

## 2024-06-08 DIAGNOSIS — Z992 Dependence on renal dialysis: Secondary | ICD-10-CM | POA: Diagnosis not present

## 2024-06-09 ENCOUNTER — Ambulatory Visit

## 2024-06-09 DIAGNOSIS — R2689 Other abnormalities of gait and mobility: Secondary | ICD-10-CM | POA: Diagnosis not present

## 2024-06-09 DIAGNOSIS — R262 Difficulty in walking, not elsewhere classified: Secondary | ICD-10-CM

## 2024-06-09 DIAGNOSIS — M25551 Pain in right hip: Secondary | ICD-10-CM

## 2024-06-09 DIAGNOSIS — M5459 Other low back pain: Secondary | ICD-10-CM | POA: Diagnosis not present

## 2024-06-09 DIAGNOSIS — M6281 Muscle weakness (generalized): Secondary | ICD-10-CM

## 2024-06-09 NOTE — Therapy (Signed)
 OUTPATIENT PHYSICAL THERAPY THORACOLUMBAR     Patient Name: Paula [name]  MRN: [id number] DOB:[birth year], 78 y.o., female Today's Date: 06/09/2024  END OF SESSION:  PT End of Session - 06/09/24 1633     Visit Number 17    Date for Recertification  07/21/24    PT Start Time 1633    PT Stop Time 1715    PT Time Calculation (min) 42 min    Activity Tolerance Patient tolerated treatment well    Behavior During Therapy [facility] for tasks assessed/performed               Past Medical History:  Diagnosis Date   Allergic rhinitis    Allergy  1/12000   Arthritis    Asthma    Brain aneurysm    Cough    Diabetes mellitus    Diastolic CHF, chronic (HCC) 10/11/2011   ESRD on hemodialysis (HCC)    MWF   GERD (gastroesophageal reflux disease)    History of colon polyps 2012   tubular adenoma    Hyperlipidemia    Hypertension    Neuropathy 10/11/2011   PONV (postoperative nausea and vomiting)    one time after lymph node surgery   Past Surgical History:  Procedure Laterality Date   AV FISTULA PLACEMENT Right 10/15/2021   Procedure: ARTERIOVENOUS (AV) FISTULA  WITH PLACEMENT OF GORE-TEX STRETCH  GRAFT (4-4mmx45cm);  Surgeon: Eliza Lonni RAMAN, MD;  Location: [facility] OR;  Service: Vascular;  Laterality: Right;   CATARACT EXTRACTION     right eye   IR 3D INDEPENDENT WKST  12/14/2017   IR ANGIO INTRA EXTRACRAN SEL COM CAROTID INNOMINATE BILAT MOD SED  09/15/2017   IR ANGIO INTRA EXTRACRAN SEL COM CAROTID INNOMINATE UNI L MOD SED  12/14/2017   IR ANGIO VERTEBRAL SEL VERTEBRAL BILAT MOD SED  09/15/2017   IR RADIOLOGIST EVAL & MGMT  09/10/2017   IR RADIOLOGIST EVAL & MGMT  10/19/2017   lymphatic mass surgery     NASAL TURBINATE REDUCTION     RADIOLOGY WITH ANESTHESIA N/A 12/14/2017   Procedure: RADIOLOGY WITH ANESTHESIA EMBOLIZATION;  Surgeon: Dolphus Carrion, MD;  Location: [facility] OR;  Service: Radiology;  Laterality: N/A;   VIDEO BRONCHOSCOPY Bilateral 11/18/2017    Procedure: VIDEO BRONCHOSCOPY WITHOUT FLUORO;  Surgeon: Shelah Lamar RAMAN, MD;  Location: [facility] ENDOSCOPY;  Service: Cardiopulmonary;  Laterality: Bilateral;   Patient Active Problem List   Diagnosis Date Noted   Hypoxemia 12/29/2023   Abnormal electrocardiogram (ECG) (EKG) 12/29/2023   Left ventricular hypertrophy 12/29/2023   CHF (congestive heart failure) (HCC) 10/12/2023   Blurry vision 10/11/2023   Anemia due to chronic kidney disease 10/11/2023   Acute respiratory failure with hypoxia (HCC) 08/17/2023   PNA (pneumonia) 08/17/2023   Volume overload 08/17/2023   Acute hypoxic respiratory failure (HCC) 08/17/2023   SNHL (sensory-neural hearing loss), 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   ESRD on dialysis (HCC) 11/20/2022   Mold exposure 08/14/2022   Opioid withdrawal (HCC) 08/14/2022   ILD (interstitial lung disease) (HCC) 08/14/2022   Secondary hyperparathyroidism, renal 08/14/2022   Paroxysmal A-fib (HCC) 05/20/2022   Hypertrophy of nasal turbinates 09/05/2021   Chronic kidney disease, stage 5 (HCC)    Aortic atherosclerosis 06/24/2021   Chronic rhinitis 10/01/2017   Seasonal allergic conjunctivitis 10/01/2017   Aneurysm 09/24/2017   Acute metabolic encephalopathy 09/02/2017   Allergic rhinitis 08/20/2017   Palpitations 07/10/2016   Gout 01/23/2016   History of cataract 02/27/2015   Seasonal and perennial allergic rhinitis 02/15/2012   Chronic  diastolic CHF (congestive heart failure) (HCC) 10/11/2011   GERD (gastroesophageal reflux disease)    Type 2 diabetes mellitus without complication, without long-term current use of insulin  (HCC) 07/03/2010   Hyperlipidemia 07/03/2010   Essential hypertension 07/03/2010    PCP: Corrina Sabin  REFERRING PROVIDER: Corrina Sabin  REFERRING DIAG: M54.50,G89.29 (ICD-10-CM) - Chronic right-sided low back pain without sciaticaM25.551 (ICD-10-CM) - Right hip pain  Rationale for Evaluation and Treatment: Rehabilitation  THERAPY DIAG:  Muscle  weakness (generalized)  Difficulty in walking, not elsewhere classified  Other low back pain  Pain in right hip  Other abnormalities of gait and mobility  ONSET DATE: 10/11/23  SUBJECTIVE:                                                                                                                                                                                           SUBJECTIVE STATEMENT: I feel okay.   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   She experiences pain in the lower back, specifically in the right hip and lower back, which makes standing for more than two minutes difficult. The pain is localized to the hip area without radiation down the leg. She is receiving physical therapy at home and is interested in outpatient therapy. Tylenol  and a heating pad provide pain relief. Hip x-ray from 2024 revealed degenerative changes send pubic symphysis, both hips, SI joints and lower lumbar spine.  She fell from her bed last Monday, resulting in pain on the left side of her head and right shoulder, which was swollen but has improved. No current swelling or pain with pressure. She experiences dizziness at times and uses a cane occasionally.    PAIN:  Are you having pain? Yes: NPRS scale: 0/10 low back pain Pain location: R hip and midline low back  Pain description: not there all the time, if I am sitting I am okay Aggravating factors: when I get up, when I am standing, walking   Relieving factors: Tylenol  and gabapentin ,   PRECAUTIONS: Fall  RED FLAGS: None   WEIGHT BEARING RESTRICTIONS: No  FALLS:  Has patient fallen in last 6 months? Yes. Number of falls 2 knee gave out in March,  then 1 time fell out of the bed   LIVING ENVIRONMENT: Lives with: lives with their family Lives in: House/apartment Stairs: Yes: Internal: 14 steps; on right going up and the wall on the other side Has following equipment at home: Single point cane and Walker - 2 wheeled  OCCUPATION: N/A  PLOF: Independent with basic ADLs  PATIENT GOALS: to get rid of the pain, be able to walk and stand for longer than 2 mins without the pain  NEXT MD VISIT: 03/01/24  OBJECTIVE:  Note: Objective measures were completed at Evaluation unless otherwise noted.  DIAGNOSTIC FINDINGS:  X ray ordered for hips, lumbar, and knees   COGNITION: Overall cognitive status: Within functional limits for tasks assessed     SENSATION: WFL  POSTURE: rounded shoulders, forward head, and increased thoracic kyphosis  PALPATION: TTP and pain lumbar spine   LUMBAR ROM:   AROM eval 01/14/24 01/21/24 03/08/24 03/21/24  Flexion Distal shin with pain [facility]   WFL  Extension 25% available Limited 25% Limited 25% [facility] WFL  Right lateral flexion Mid thigh with pain Limited 25% Limited 25% 25% limited  WFL  Left lateral flexion Mid thigh with pain WFL   WFL  Right rotation 75% available with pain [facility]   WFL  Left rotation 75% available with pain WFL   WFL   (Blank rows = not tested)  LOWER EXTREMITY ROM:   grossly WFL   LOWER EXTREMITY MMT:  grossly 4-/5 BLE    FUNCTIONAL TESTS:  5 times sit to stand: 43s from elevated mat and needing UE push off, loses balance on to heels  Timed up and go (TUG): 24s   GAIT: Distance walked: in clinic distances Assistive device utilized: None Level of assistance: CGA Comments: furniture walking, or reaches out to grab daughter's hand to walk  TREATMENT DATE:   06/09/24 NuStep L5x45mins Walking laps goal is 536ft Leg ext 10# 2x10 HS curls 25# 2x10 STS chest press with red ball 2x10 Passive stretching to bilateral LE Piriformis, HS, ITB, K2C, lower trunk rotation  05/19/24 SPO2 95% NuStep L5x29mins Leg ext 5# 2x12 HS curls 20# 2x12 Step ups 4  STS chest press with red ball 2x10 Passive stretching to bilateral LE Piriformis, HS, ITB, K2C, Double K2C, lower trunk rotation  05/12/24 NuStep L5 x 6 min HS cutls 20lb 2x10 Leg Ext 5lb 2x10 Sit to stand holding yellow ball 2x10 Seated rows green 2x10 Shoulder Ext green 2x10 Passive stretching to bilateral LE  Piriformis, HS, ITB, K2C  Double K2C, lower trunk rotation   05/10/24 Focus session on est new HEP she can do in bed to hoepfully increase compliance. Daughter present for carry over Access Code: [email or web address] URL: [email or web address] Date: 05/10/2024 Prepared by: Angela Payseur  Exercises - Supine Posterior Pelvic Tilt  - 1 x daily - 7 x weekly - 3 sets - 10 reps - Supine Bridge  - 2 x daily - 7 x weekly - 1 sets - 10 reps - Supine Lower Trunk Rotation  - 2 x daily - 7 x weekly - 1 sets - 10 reps - Supine Hamstring Stretch with Strap  - 2 x daily - 7 x weekly - 2 sets - 5 reps - 10 hold - Supine March  - 2 x daily - 7 x weekly - 2 sets - 10 reps - Hooklying Abduction with Resistance  - 2 x daily - 7 x weekly - 1 sets - 10 reps  PROM BIL LE and trunk  Nustep L 5   05/05/24 BP 123/59 HR 80 Bike L 3 x 6 min Goals Sit to stands 2x10 LE on thighs  LAQ 3lb 2x10 HS curls green 2x10 Passive stretching to bilateral HS, piriformis, Glute, ITB  04/14/24 Nustep L 6 Resisted gait 30# 4 ways 4 x each 10# cable row 10 x 5# cable shld ext 10x 5# cable hip 4 way 10 x- pain and weakness noted RT LB/glut with ex. Left side weakness with ext and abd but no pain Passive stretching to bilateral HS, piriformis, Glute, ITB   04/12/24 Nustep L 6 7 min STS 5 x 14.2 sec,  assessed goals Knee ext 5# 2 sets 10 HS curl 20# 2 sets 10 STS with wt ball press 10x then 10 x OH Cable pulley shld ext 5# 2 sets 10 Resisted trunk ext 20 x Leg Press 30# 2 sets 10 Passive stretching to bilateral HS, piriformis, Glute, ITB    03/22/24 Bike L 3x 5 min Goals  5x sit tot stand 22.23 sec Rows & Ext green 2x10 Passive stretching to bilateral HS, piriformis, Glute, ITB Gait 167ft ~ 52.41 seconds to complete  03/08/24 NuStep L 5 x 6 min  HS curls 20lb 2x10 Leg Ext 5lb 2x10 Rows & Ext green 2x10 Passive stretching to bilateral HS, piriformis, Glute, ITB   02/11/24 NuStep L5x54mins Shoulder ext 5# 2x10 Step ups 4  Feet on ball rotations and knees to chest x10 Bridges 2x10 Passive stretching HS, glutes, SKTC, LTR STS 2x10   PATIENT EDUCATION:  Education details: POC, HEP, importance of movement Person educated: Patient and Child(ren) Education method: Explanation and Demonstration Education comprehension: verbalized understanding  HOME EXERCISE PROGRAM: Access Code: [email or web address] URL: [email or web address] Date: 01/12/2024 Prepared by: Tanda Sorrow  Exercises - Supine Lower Trunk Rotation  - 1 x daily - 7 x weekly - 2 sets - 10 reps - Seated March  - 1 x daily - 7 x weekly - 2 sets - 10 reps - Seated Hip Abduction with Resistance  - 1 x daily - 7 x weekly - 2 sets - 10 reps - Sit to Stand  - 1 x daily - 7 x weekly - 2 sets - 5 reps - Supine Bridge  - 1 x daily - 7 x weekly - 3 sets - 10 reps - Standing Hip Abduction with Counter Support  - 1 x daily - 7 x weekly - 3 sets - 10 reps  ASSESSMENT:  CLINICAL IMPRESSION: Pt enters doing well. Daughter reports her medications have changed, and she is only taking 2 for her lung issues. Today she is able to get through session without coughing. Patient would like to continue with therapy to work on her endurance, flexibility, and back pain. Tightness in the RLE compared to the L side. No pain during  session. Able to walk 516ft but with some fatigue and SOB.   Patient is a 78 y.o. female who was seen today for physical therapy evaluation and treatment for balance, gait, low back and hip pain. She is ambulating without a device but is very unsteady and shuffles her feet. She was advised to use the [facility] at the least. Patient has a chronic cough that is pretty bad. Her daughter reports she is not complaint with HEP and spends most of the day sitting on the sofa. She presents with LE weakness and balance deficits that put her at a high fall risk. She states that her back and hip pain  prevent her from doing anything. Patient is very wobbly when going from sit to stand especially after prolonged sitting. She will benefit from skilled PT to address her gait and balance impairments and decrease her hip and back pain to be more mobile.   OBJECTIVE IMPAIRMENTS: Abnormal gait, cardiopulmonary status limiting activity, decreased activity tolerance, decreased balance, decreased coordination, decreased endurance, decreased mobility, difficulty walking, decreased ROM, decreased strength, postural dysfunction, and pain.   ACTIVITY LIMITATIONS: lifting, bending, sitting, standing, squatting, transfers, and locomotion level  PARTICIPATION LIMITATIONS: meal prep, cleaning, laundry, shopping, and community activity  PERSONAL FACTORS: Age, Behavior pattern, Past/current experiences, Time since onset of injury/illness/exacerbation, and 3+ comorbidities: CHF, ESRD on dialysis, neuropathy, chronic cough are also affecting patient's functional outcome.   REHAB POTENTIAL: Fair    CLINICAL DECISION MAKING: Stable/uncomplicated  EVALUATION COMPLEXITY: Low   GOALS: Goals reviewed with patient? Yes  SHORT TERM GOALS: Target date: 02/04/24  Patient will be independent with initial HEP. Baseline: given 12/24/23 Goal status: 01/07/24 MET  2.  Patient will demonstrate decreased fall risk by scoring < 15 sec on  TUG. Baseline: 24s Goal status: 01/07/24 MET   LONG TERM GOALS: Target date: 07/21/24  Patient will be independent with advanced/ongoing HEP to improve outcomes and carryover.  Baseline:  Goal status: ongoing 03/22/24  04/12/24 MET  2.  Patient will be able to ambulate 500' with good safety to access community.  Baseline: not using AD, very unsteady and unable to walk or stand more than 2 mins Goal status: Progressing slow gait 03/08/24, ongoing 03/22/24  progressing 04/12/24, Ongoing 05/05/24, MET 06/09/24 REVISED GOAL: 822ft without SOB and fatigue   3.  Patient will report 50-75% improvement in R hip and low back pain Baseline: 10/10  Goal status: Progressing 7/10, Met 03/22/24  MET 04/12/24, back has been hurting 05/05/24, back has not hurt, it is better 05/19/24 MET  4.  Patient will demonstrate improved functional LE strength as demonstrated by 5xSTS <20s Baseline: 43s elevated mat and UE push off Goal status: Progressing 01/14/24, Progressing 01/21/24,  ongoing 03/21/24  MET 04/12/24  5.  Patient will increase standing and walking tolerance to 10 mins  Baseline: 2-5 mins  Goal status: Ongoing 3 min 03/08/24, ongoing 03/22/24  and 04/12/24, 05/05/24 ongoing, 05/12/24 ongoing, doing better but not that long 06/09/24    PLAN:  PT FREQUENCY: 2x/week  PT DURATION: 12 weeks  PLANNED INTERVENTIONS: 97110-Therapeutic exercises, 97530- Therapeutic activity, 97112- Neuromuscular re-education, 97535- Self Care, [id number]- Manual therapy, [phone]- Gait training, [phone]- Electrical stimulation (unattended), Patient/Family education, Balance training, Stair training, Dry Needling, Joint mobilization, Spinal mobilization, Cryotherapy, and Moist heat.  PLAN FOR NEXT SESSION: check HEP compliance     Almetta Fam, PT 06/09/2024, 5:15 PM

## 2024-06-10 ENCOUNTER — Other Ambulatory Visit (HOSPITAL_COMMUNITY): Payer: Self-pay

## 2024-06-10 DIAGNOSIS — N2581 Secondary hyperparathyroidism of renal origin: Secondary | ICD-10-CM | POA: Diagnosis not present

## 2024-06-10 DIAGNOSIS — Z992 Dependence on renal dialysis: Secondary | ICD-10-CM | POA: Diagnosis not present

## 2024-06-10 DIAGNOSIS — I129 Hypertensive chronic kidney disease with stage 1 through stage 4 chronic kidney disease, or unspecified chronic kidney disease: Secondary | ICD-10-CM | POA: Diagnosis not present

## 2024-06-10 DIAGNOSIS — N186 End stage renal disease: Secondary | ICD-10-CM | POA: Diagnosis not present

## 2024-06-13 ENCOUNTER — Encounter: Payer: Self-pay | Admitting: Cardiology

## 2024-06-13 ENCOUNTER — Other Ambulatory Visit (HOSPITAL_COMMUNITY): Payer: Self-pay

## 2024-06-13 DIAGNOSIS — N2581 Secondary hyperparathyroidism of renal origin: Secondary | ICD-10-CM | POA: Diagnosis not present

## 2024-06-13 DIAGNOSIS — N186 End stage renal disease: Secondary | ICD-10-CM | POA: Diagnosis not present

## 2024-06-13 DIAGNOSIS — Z992 Dependence on renal dialysis: Secondary | ICD-10-CM | POA: Diagnosis not present

## 2024-06-15 DIAGNOSIS — Z992 Dependence on renal dialysis: Secondary | ICD-10-CM | POA: Diagnosis not present

## 2024-06-15 DIAGNOSIS — N2581 Secondary hyperparathyroidism of renal origin: Secondary | ICD-10-CM | POA: Diagnosis not present

## 2024-06-20 ENCOUNTER — Encounter: Payer: Self-pay | Admitting: Cardiology

## 2024-06-20 ENCOUNTER — Telehealth: Payer: Self-pay | Admitting: Cardiology

## 2024-06-20 DIAGNOSIS — Z992 Dependence on renal dialysis: Secondary | ICD-10-CM | POA: Diagnosis not present

## 2024-06-20 DIAGNOSIS — N2581 Secondary hyperparathyroidism of renal origin: Secondary | ICD-10-CM | POA: Diagnosis not present

## 2024-06-20 DIAGNOSIS — N186 End stage renal disease: Secondary | ICD-10-CM | POA: Diagnosis not present

## 2024-06-20 NOTE — Telephone Encounter (Signed)
 Returned call, no answer. No updated DPR on file, LVM asking recipient to call [facility] at our phone #.

## 2024-06-20 NOTE — Telephone Encounter (Signed)
 STAT if HR is under 50 or over 120 (normal HR is 60-100 beats per minute)  What is your heart rate? 93-100  Do you have a log of your heart rate readings (document readings)? No  Do you have any other symptoms? Oxygen level low - 83-89 (today)       Elevated BP - 174/92 (today)   Pt states that she can hear her heart beating fast

## 2024-06-23 NOTE — Telephone Encounter (Signed)
See phone note from 11/10

## 2024-06-23 NOTE — Telephone Encounter (Signed)
 Spoke with pt regarding her shortness of breath. Pt stated she has been having trouble breathing at night. Pt stated she wakes up at 3 or 4 am and cannot breath. She gets up and puts her oxygen on and the sob subsides. Pt stated she has also been having some elevated heart rates. 110 was the highest she saw. Pt stated she can feel her heart beating in her chest. Pt denies any chest pain. Pt was given ED precautions and made an appointment with DOD Dr. Floretta on 11/20. Pt verbalized understanding. All questions if any were answered.

## 2024-06-27 ENCOUNTER — Telehealth: Payer: Self-pay | Admitting: *Deleted

## 2024-06-27 DIAGNOSIS — N186 End stage renal disease: Secondary | ICD-10-CM | POA: Diagnosis not present

## 2024-06-27 NOTE — Telephone Encounter (Signed)
 Spoke to patient and let her know that her blood pressure readings had been reviewed by [facility]. Per [place] patient's blood pressures are fairly labile which could be due to dialysis. However overall average blood pressure seems well-controlled. I suggest no medication changes at this time. Continue current medication regimen and follow-up as planned. I will be sending over a release form to the dialysis facility so that they can send us  patient information to keep track of the readings that she receives there. Patient understood everything and had no other questions.

## 2024-06-29 ENCOUNTER — Other Ambulatory Visit: Payer: Self-pay | Admitting: Physician Assistant

## 2024-06-29 ENCOUNTER — Other Ambulatory Visit: Payer: Self-pay | Admitting: Family Medicine

## 2024-06-29 DIAGNOSIS — Z992 Dependence on renal dialysis: Secondary | ICD-10-CM | POA: Diagnosis not present

## 2024-06-29 DIAGNOSIS — I5032 Chronic diastolic (congestive) heart failure: Secondary | ICD-10-CM

## 2024-06-29 DIAGNOSIS — E1149 Type 2 diabetes mellitus with other diabetic neurological complication: Secondary | ICD-10-CM

## 2024-06-29 DIAGNOSIS — E1122 Type 2 diabetes mellitus with diabetic chronic kidney disease: Secondary | ICD-10-CM

## 2024-06-29 NOTE — Progress Notes (Signed)
 Cardiology Office Note:   Date:  06/29/2024  ID:  Paula [name] , DOB [birth year], MRN [id number] PCP: Delbert Clam, MD  [facility] Providers Cardiologist:  Lynwood Schilling, MD { Chief Complaint: No chief complaint on file.     History of Present Illness:   Paula [name]  is a 78 y.o. female with a PMH of nonobstructive CAD, HTN, HFpEF, PAF, DM2, brain aneurysm, ESRD who presents for an acute visit for SOB.   Past Medical History:  Diagnosis Date   Allergic rhinitis    Allergy  1/12000   Arthritis    Asthma    Brain aneurysm    Cough    Diabetes mellitus    Diastolic CHF, chronic (HCC) 10/11/2011   ESRD on hemodialysis (HCC)    MWF   GERD (gastroesophageal reflux disease)    History of colon polyps 2012   tubular adenoma    Hyperlipidemia    Hypertension    Neuropathy 10/11/2011   PONV (postoperative nausea and vomiting)    one time after lymph node surgery     Studies Reviewed:    EKG: ***       Cardiac Studies & Procedures   ______________________________________________________________________________________________   STRESS TESTS  MYOCARDIAL PERFUSION IMAGING 07/12/2021  Interpretation Summary   The patient reported dyspnea and nausea during the stress test. Patient also reported vomitng, hypotension and stomach pains.   ECG is normal. ECG rhythm shows normal sinus rhythm. Resting ECG shows no ST-segment deviation.   There were no arrhythmias during stress. There were no arrhythmias during recovery. ECG was interpretable and without significant changes. The ECG was not diagnostic due to pharmacologic protocol.   Diaphragmatic attenuation artifact was present. Image quality affected due to significant extracardiac activity.   LV perfusion is normal. There is no evidence of ischemia. There is no evidence of infarction.   No evidence of transient ischemic dilation (TID) noted.   Prior study available for comparison from 05/17/2008.   The  study is normal. The study is low risk.   ECHOCARDIOGRAM  ECHOCARDIOGRAM COMPLETE 08/17/2023  Narrative ECHOCARDIOGRAM REPORT    Patient Name:   Paula [name]  Date of Exam: 08/17/2023 Medical Rec #:  [id number]           Height:       63.0 in Accession #:    [id number]          Weight:       163.4 lb Date of Birth:  [birth year]           BSA:          1.774 m Patient Age:    77 years            BP:           117/65 mmHg Patient Gender: F                   HR:           84 bpm. Exam Location:  Inpatient  Procedure: 2D Echo, Cardiac Doppler, Color Doppler and Strain Analysis  Indications:    CHF  History:        Patient has prior history of Echocardiogram examinations, most recent 10/16/2021. CHF, Arrythmias:Atrial Fibrillation; Risk Factors:Hypertension and Diabetes.  Sonographer:    Lanell Maduro Referring Phys: 63 JARED M GARDNER  IMPRESSIONS   1. Left ventricular ejection fraction, by estimation, is 60 to 65%. Left ventricular ejection fraction by 2D MOD biplane is 62.1 %.  The left ventricle has normal function. The left ventricle has no regional wall motion abnormalities. There is severe concentric left ventricular hypertrophy. Left ventricular diastolic function could not be evaluated. 2. Right ventricular systolic function is low normal. The right ventricular size is normal. Tricuspid regurgitation signal is inadequate for assessing PA pressure. 3. Left atrial size was moderately dilated. 4. The mitral valve is abnormal. Trivial mitral valve regurgitation. 5. The aortic valve is tricuspid. Aortic valve regurgitation is trivial. Aortic valve sclerosis/calcification is present, without any evidence of aortic stenosis. 6. The inferior vena cava is normal in size with greater than 50% respiratory variability, suggesting right atrial pressure of 3 mmHg.  Comparison(s): No prior Echocardiogram.  FINDINGS Left Ventricle: Left ventricular ejection fraction, by estimation,  is 60 to 65%. Left ventricular ejection fraction by 2D MOD biplane is 62.1 %. The left ventricle has normal function. The left ventricle has no regional wall motion abnormalities. The left ventricular internal cavity size was normal in size. There is severe concentric left ventricular hypertrophy. Left ventricular diastolic function could not be evaluated due to atrial fibrillation. Left ventricular diastolic function could not be evaluated.  Right Ventricle: The right ventricular size is normal. No increase in right ventricular wall thickness. Right ventricular systolic function is low normal. Tricuspid regurgitation signal is inadequate for assessing PA pressure.  Left Atrium: Left atrial size was moderately dilated.  Right Atrium: Right atrial size was normal in size.  Pericardium: There is no evidence of pericardial effusion.  Mitral Valve: The mitral valve is abnormal. There is mild calcification of the anterior and posterior mitral valve leaflet(s). Trivial mitral valve regurgitation.  Tricuspid Valve: The tricuspid valve is grossly normal. Tricuspid valve regurgitation is trivial.  Aortic Valve: The aortic valve is tricuspid. Aortic valve regurgitation is trivial. Aortic valve sclerosis/calcification is present, without any evidence of aortic stenosis.  Pulmonic Valve: The pulmonic valve was grossly normal. Pulmonic valve regurgitation is trivial.  Aorta: The aortic root and ascending aorta are structurally normal, with no evidence of dilitation.  Venous: The inferior vena cava is normal in size with greater than 50% respiratory variability, suggesting right atrial pressure of 3 mmHg.  IAS/Shunts: No atrial level shunt detected by color flow Doppler.   LEFT VENTRICLE PLAX 2D                        Biplane EF (MOD) LVIDd:         3.10 cm         LV Biplane EF:   Left LVIDs:         2.30 cm                          ventricular LV PW:         1.80 cm                           ejection LV IVS:        2.00 cm                          fraction by LVOT diam:     2.10 cm                          2D MOD LV SV:         75  biplane is LV SV Index:   42                               62.1 %. LVOT Area:     3.46 cm Diastology LV e' medial:    6.31 cm/s LV Volumes (MOD)               LV E/e' medial:  23.3 LV vol d, MOD    41.4 ml       LV e' lateral:   8.27 cm/s A2C:                           LV E/e' lateral: 17.8 LV vol d, MOD    41.8 ml A4C: LV vol s, MOD    14.8 ml A2C: LV vol s, MOD    16.2 ml A4C: LV SV MOD A2C:   26.6 ml LV SV MOD A4C:   41.8 ml LV SV MOD BP:    26.9 ml  RIGHT VENTRICLE             IVC RV Basal diam:  3.30 cm     IVC diam: 1.70 cm RV S prime:     10.90 cm/s TAPSE (M-mode): 1.9 cm  LEFT ATRIUM             Index        RIGHT ATRIUM           Index LA diam:        3.90 cm 2.20 cm/m   RA Area:     15.90 cm LA Vol (A2C):   86.5 ml 48.75 ml/m  RA Volume:   35.60 ml  20.06 ml/m LA Vol (A4C):   63.0 ml 35.51 ml/m LA Biplane Vol: 76.8 ml 43.28 ml/m AORTIC VALVE LVOT Vmax:   108.00 cm/s LVOT Vmean:  81.200 cm/s LVOT VTI:    0.216 m  AORTA Ao Root diam: 2.70 cm Ao Asc diam:  3.20 cm  MITRAL VALVE MV Area (PHT): 4.80 cm     SHUNTS MV Decel Time: 158 msec     Systemic VTI:  0.22 m MV E velocity: 147.00 cm/s  Systemic Diam: 2.10 cm  Vinie Maxcy MD Electronically signed by Vinie Maxcy MD Signature Date/Time: 08/17/2023/11:20:28 AM    Final         PYP SCAN  MYOCARDIAL AMYLOID PLANAR AND SPECT 03/16/2024  Interpretation Summary   Findings are not suggestive of cardiac ATTR amyloidosis. The myocardium was negative for radiotracer uptake.   The visual grade of myocardial uptake relative to the ribs was Grade 0 (No myocardial uptake and normal bone uptake).   Coronary calcium  was present on the attenuation correction CT images. Severe coronary calcifications were present. Coronary calcifications  were present in the left anterior descending artery, left circumflex artery and right coronary artery distribution(s).  ______________________________________________________________________________________________      Risk Assessment/Calculations:   {Does this patient have ATRIAL FIBRILLATION?:[id number]} No BP recorded.  {Refresh Note OR Click here to enter BP  :1}***        Physical Exam:     VS:  LMP  (LMP Unknown)  ***    Wt Readings from Last 3 Encounters:  06/02/24 164 lb (74.4 kg)  05/17/24 169 lb 9.6 oz (76.9 kg)  05/03/24 165 lb 12.8 oz (75.2 kg)     GEN: Well nourished, well developed, in  no acute distress NECK: No JVD; No carotid bruits CARDIAC: ***RRR, no murmurs, rubs, gallops RESPIRATORY:  Clear to auscultation without rales, wheezing or rhonchi  ABDOMEN: Soft, non-tender, non-distended, normal bowel sounds EXTREMITIES:  Warm and well perfused, no edema; No deformity, 2+ radial pulses PSYCH: Normal mood and affect   Assessment & Plan       {Are you ordering a CV Procedure (e.g. stress test, cath, DCCV, TEE, etc)?   Press F2        :[id number]}   This note was written with the assistance of a dictation microphone or AI dictation software. Please excuse any typos or grammatical errors.   Signed, Georganna Archer, MD 06/29/2024 9:05 PM    [facility]

## 2024-06-30 ENCOUNTER — Ambulatory Visit (INDEPENDENT_AMBULATORY_CARE_PROVIDER_SITE_OTHER)

## 2024-06-30 ENCOUNTER — Other Ambulatory Visit (HOSPITAL_COMMUNITY): Payer: Self-pay

## 2024-06-30 ENCOUNTER — Other Ambulatory Visit: Payer: Self-pay

## 2024-06-30 ENCOUNTER — Ambulatory Visit
Attending: Student in an Organized Health Care Education/Training Program | Admitting: Student in an Organized Health Care Education/Training Program

## 2024-06-30 VITALS — BP 110/50 | HR 90 | Ht 62.0 in | Wt 165.6 lb

## 2024-06-30 DIAGNOSIS — R0602 Shortness of breath: Secondary | ICD-10-CM

## 2024-06-30 DIAGNOSIS — J9601 Acute respiratory failure with hypoxia: Secondary | ICD-10-CM | POA: Diagnosis not present

## 2024-06-30 DIAGNOSIS — I480 Paroxysmal atrial fibrillation: Secondary | ICD-10-CM

## 2024-06-30 MED ORDER — FUROSEMIDE 20 MG PO TABS
60.0000 mg | ORAL_TABLET | Freq: Every day | ORAL | 3 refills | Status: AC
  Filled 2024-06-30: qty 270, 90d supply, fill #0
  Filled 2024-07-12 – 2024-09-03 (×4): qty 270, 90d supply, fill #1

## 2024-06-30 MED ORDER — GABAPENTIN 100 MG PO CAPS
100.0000 mg | ORAL_CAPSULE | Freq: Three times a day (TID) | ORAL | 1 refills | Status: AC
  Filled 2024-06-30: qty 270, 90d supply, fill #0

## 2024-06-30 MED ORDER — ATORVASTATIN CALCIUM 80 MG PO TABS
80.0000 mg | ORAL_TABLET | Freq: Every day | ORAL | 1 refills | Status: DC
  Filled 2024-06-30: qty 90, 90d supply, fill #0

## 2024-06-30 NOTE — Assessment & Plan Note (Signed)
-   Patient reporting increased oxygen requirement and desaturations at home to the 80s. -From what I can tell the patient does not have a known underlying lung disease, so she should not have an oxygen requirement. -During my examination of her she does not appear to be volume up; however, her symptoms correspond to a reduction in her fluid removal rate with dialysis. -I had a lengthy discussion with the patient and her daughter regarding possible treatment plans. -Since the patient does make a little bit of urine, I will trial Lasix  empirically to see if that helps with some of her respiratory symptoms and to aid with fluid removal in addition to her dialysis. -I will also get a noncontrasted CT scan of the lungs to look for any signs of volume overload or parenchymal disease. Start Lasix  60 mg daily Noncon CT scan of the chest Follow-up with primary cardiologist in 1 month

## 2024-06-30 NOTE — Patient Instructions (Signed)
 Medication Instructions:  Start Lasix  20 mg take 3 tablets (60 mg) daily *If you need a refill on your cardiac medications before your next appointment, please call your pharmacy*    Testing/Procedures:   Your cardiac CT will be scheduled at one of the below locations:   [facility] [address]  [place], [place] [address]  If scheduled at the [facility] at [address], please enter the parking lot using the [address] entrance and use the FREE valet service at the patient drop-off area. Enter the building and check-in with registration on the main floor.   Please follow these instructions carefully (unless otherwise directed):  An IV will be required for this test and Nitroglycerin will be given.  Hold all erectile dysfunction medications at least 3 days (72 hrs) prior to test. (Ie viagra, cialis, sildenafil, tadalafil, etc)   On the Night Before the Test: Be sure to Drink plenty of water. Do not consume any caffeinated/decaffeinated beverages or chocolate 12 hours prior to your test. Do not take any antihistamines 12 hours prior to your test.  If the patient has contrast allergy : Patient will need a prescription for Prednisone  and very clear instructions (as follows): Prednisone  50 mg - take 13 hours prior to test Take another Prednisone  50 mg 7 hours prior to test Take another Prednisone  50 mg 1 hour prior to test Take Benadryl  50 mg 1 hour prior to test Patient must complete all four doses of above prophylactic medications. Patient will need a ride after test due to Benadryl .  On the Day of the Test: Drink plenty of water until 1 hour prior to the test. Do not eat any food 1 hour prior to test. You may take your regular medications prior to the test.  Take metoprolol  (Lopressor ) two hours prior to test. If you take Furosemide /Hydrochlorothiazide /Spironolactone/Chlorthalidone, please HOLD on the morning of the  test. Patients who wear a continuous glucose monitor MUST remove the device prior to scanning. FEMALES- please wear underwire-free bra if available, avoid dresses & tight clothing  After the Test: Drink plenty of water. After receiving IV contrast, you may experience a mild flushed feeling. This is normal. On occasion, you may experience a mild rash up to 24 hours after the test. This is not dangerous. If this occurs, you can take Benadryl  25 mg, Zyrtec , Claritin , or Allegra and increase your fluid intake. (Patients taking Tikosyn should avoid Benadryl , and may take Zyrtec , Claritin , or Allegra) If you experience trouble breathing, this can be serious. If it is severe call 911 IMMEDIATELY. If it is mild, please call our office.  We will call to schedule your test 2-4 weeks out understanding that some insurance companies will need an authorization prior to the service being performed.   For more information and frequently asked questions, please visit our website : [email or web address]  For non-scheduling related questions, please contact the cardiac imaging nurse navigator should you have any questions/concerns: Cardiac Imaging Nurse Navigators Direct Office Dial: [phone]   For scheduling needs, including cancellations and rescheduling, please call Brittany, [phone].   Follow-Up: At [facility], you and your health needs are our priority.  As part of our continuing mission to provide you with exceptional heart care, our providers are all part of one team.  This team includes your primary Cardiologist (physician) and Advanced Practice Providers or APPs (Physician Assistants and Nurse Practitioners) who all work together to provide you with the care you  need, when you need it.  Your next appointment:   1 month(s)  Provider:   Lynwood Schilling, MD

## 2024-06-30 NOTE — Assessment & Plan Note (Signed)
 EKG nursing visit as above

## 2024-07-01 ENCOUNTER — Other Ambulatory Visit (HOSPITAL_COMMUNITY): Payer: Self-pay

## 2024-07-01 ENCOUNTER — Other Ambulatory Visit: Payer: Self-pay

## 2024-07-01 MED ORDER — ISOSORBIDE MONONITRATE ER 30 MG PO TB24
30.0000 mg | ORAL_TABLET | Freq: Every day | ORAL | 3 refills | Status: AC
  Filled 2024-07-01: qty 90, 90d supply, fill #0
  Filled 2024-07-19 – 2024-08-05 (×2): qty 90, 90d supply, fill #1

## 2024-07-03 DIAGNOSIS — Z992 Dependence on renal dialysis: Secondary | ICD-10-CM | POA: Diagnosis not present

## 2024-07-03 DIAGNOSIS — N2581 Secondary hyperparathyroidism of renal origin: Secondary | ICD-10-CM | POA: Diagnosis not present

## 2024-07-04 ENCOUNTER — Ambulatory Visit: Attending: Family Medicine

## 2024-07-04 ENCOUNTER — Other Ambulatory Visit: Payer: Self-pay

## 2024-07-04 ENCOUNTER — Other Ambulatory Visit (HOSPITAL_COMMUNITY): Payer: Self-pay

## 2024-07-04 DIAGNOSIS — M6281 Muscle weakness (generalized): Secondary | ICD-10-CM | POA: Insufficient documentation

## 2024-07-04 DIAGNOSIS — M62838 Other muscle spasm: Secondary | ICD-10-CM | POA: Insufficient documentation

## 2024-07-04 DIAGNOSIS — M25551 Pain in right hip: Secondary | ICD-10-CM | POA: Insufficient documentation

## 2024-07-04 DIAGNOSIS — R2689 Other abnormalities of gait and mobility: Secondary | ICD-10-CM | POA: Insufficient documentation

## 2024-07-04 DIAGNOSIS — M25511 Pain in right shoulder: Secondary | ICD-10-CM | POA: Diagnosis not present

## 2024-07-04 DIAGNOSIS — M5459 Other low back pain: Secondary | ICD-10-CM | POA: Insufficient documentation

## 2024-07-04 DIAGNOSIS — R262 Difficulty in walking, not elsewhere classified: Secondary | ICD-10-CM | POA: Diagnosis not present

## 2024-07-04 NOTE — Therapy (Signed)
 OUTPATIENT PHYSICAL THERAPY THORACOLUMBAR     Patient Name: Paula [name]  MRN: [id number] DOB:1946/06/30, 78 y.o., female Today's Date: 07/04/2024  END OF SESSION:  PT End of Session - 07/04/24 1302     Visit Number 18    Date for Recertification  07/21/24    Authorization Type [employer]    PT Start Time 1303    PT Stop Time 1345    PT Time Calculation (min) 42 min    Activity Tolerance Patient tolerated treatment well    Behavior During Therapy [facility] for tasks assessed/performed                Past Medical History:  Diagnosis Date   Allergic rhinitis    Allergy  1/12000   Arthritis    Asthma    Brain aneurysm    Cough    Diabetes mellitus    Diastolic CHF, chronic (HCC) 10/11/2011   ESRD on hemodialysis (HCC)    MWF   GERD (gastroesophageal reflux disease)    History of colon polyps 2012   tubular adenoma    Hyperlipidemia    Hypertension    Neuropathy 10/11/2011   PONV (postoperative nausea and vomiting)    one time after lymph node surgery   Past Surgical History:  Procedure Laterality Date   AV FISTULA PLACEMENT Right 10/15/2021   Procedure: ARTERIOVENOUS (AV) FISTULA  WITH PLACEMENT OF GORE-TEX STRETCH  GRAFT (4-92mmx45cm);  Surgeon: Eliza Lonni RAMAN, MD;  Location: [facility] OR;  Service: Vascular;  Laterality: Right;   CATARACT EXTRACTION     right eye   IR 3D INDEPENDENT WKST  12/14/2017   IR ANGIO INTRA EXTRACRAN SEL COM CAROTID INNOMINATE BILAT MOD SED  09/15/2017   IR ANGIO INTRA EXTRACRAN SEL COM CAROTID INNOMINATE UNI L MOD SED  12/14/2017   IR ANGIO VERTEBRAL SEL VERTEBRAL BILAT MOD SED  09/15/2017   IR RADIOLOGIST EVAL & MGMT  09/10/2017   IR RADIOLOGIST EVAL & MGMT  10/19/2017   lymphatic mass surgery     NASAL TURBINATE REDUCTION     RADIOLOGY WITH ANESTHESIA N/A 12/14/2017   Procedure: RADIOLOGY WITH ANESTHESIA EMBOLIZATION;  Surgeon: Dolphus Carrion, MD;  Location: [facility] OR;  Service: Radiology;  Laterality: N/A;   VIDEO BRONCHOSCOPY  Bilateral 11/18/2017   Procedure: VIDEO BRONCHOSCOPY WITHOUT FLUORO;  Surgeon: Shelah Lamar RAMAN, MD;  Location: [facility] ENDOSCOPY;  Service: Cardiopulmonary;  Laterality: Bilateral;   Patient Active Problem List   Diagnosis Date Noted   Hypoxemia 12/29/2023   Abnormal electrocardiogram (ECG) (EKG) 12/29/2023   Left ventricular hypertrophy 12/29/2023   CHF (congestive heart failure) (HCC) 10/12/2023   Blurry vision 10/11/2023   Anemia due to chronic kidney disease 10/11/2023   Acute respiratory failure with hypoxia (HCC) 08/17/2023   PNA (pneumonia) 08/17/2023   Volume overload 08/17/2023   Acute hypoxic respiratory failure (HCC) 08/17/2023   SNHL (sensory-neural hearing loss), 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   ESRD on dialysis (HCC) 11/20/2022   Mold exposure 08/14/2022   Opioid withdrawal (HCC) 08/14/2022   ILD (interstitial lung disease) (HCC) 08/14/2022   Secondary hyperparathyroidism, renal 08/14/2022   Paroxysmal A-fib (HCC) 05/20/2022   Hypertrophy of nasal turbinates 09/05/2021   Chronic kidney disease, stage 5 (HCC)    Aortic atherosclerosis 06/24/2021   Chronic rhinitis 10/01/2017   Seasonal allergic conjunctivitis 10/01/2017   Aneurysm 09/24/2017   Acute metabolic encephalopathy 09/02/2017   Allergic rhinitis 08/20/2017   Palpitations 07/10/2016   Gout 01/23/2016   History of cataract 02/27/2015   Seasonal and  perennial allergic rhinitis 02/15/2012   Chronic diastolic CHF (congestive heart failure) (HCC) 10/11/2011   GERD (gastroesophageal reflux disease)    Type 2 diabetes mellitus without complication, without long-term current use of insulin  (HCC) 07/03/2010   Hyperlipidemia 07/03/2010   Essential hypertension 07/03/2010    PCP: Corrina Sabin  REFERRING PROVIDER: Corrina Sabin  REFERRING DIAG: M54.50,G89.29 (ICD-10-CM) - Chronic right-sided low back pain without sciaticaM25.551 (ICD-10-CM) - Right hip pain  Rationale for Evaluation and Treatment:  Rehabilitation  THERAPY DIAG:  Muscle weakness (generalized)  Difficulty in walking, not elsewhere classified  Other low back pain  Pain in right hip  Other abnormalities of gait and mobility  Balance problem  Acute pain of right shoulder  Other muscle spasm  ONSET DATE: 10/11/23  SUBJECTIVE:                                                                                                                                                                                           SUBJECTIVE STATEMENT: I feel good, havent been able to get here because of changes in my meds and breathing   PERTINENT HISTORY:  Paula [name]  is a 78 year old female with  a history of type 2 diabetes mellitus (A1c 76.9), hypertension, hypercholesterolemia, allergic rhinitis, severe upper airway cough syndrome, stage V CKD (managed by Dr [facility], currently on hemodialysis Tuesday, Thursday and Saturday), 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   She experiences pain in the lower back, specifically in the right hip and lower back, which makes standing for more than two minutes difficult. The pain is localized to the hip area without radiation down the leg. She is receiving physical therapy at home and is interested in outpatient therapy. Tylenol  and a heating pad provide pain relief. Hip x-ray from 2024 revealed degenerative changes send pubic symphysis, both hips, SI joints and lower lumbar spine.  She fell from her bed last Monday, resulting in pain on the left side of her head and right shoulder, which was swollen but has improved. No current swelling or pain with pressure. She experiences dizziness at times and uses a cane occasionally.    PAIN:  Are you having pain? Yes: NPRS scale: 0/10 low back  pain Pain location: R hip and midline low back  Pain description: not there all the time, if I am sitting I am okay Aggravating factors: when I get up, when I am standing, walking  Relieving factors: Tylenol  and gabapentin ,  PRECAUTIONS: Fall  RED FLAGS: None   WEIGHT BEARING RESTRICTIONS: No  FALLS:  Has patient fallen in last 6 months? Yes. Number of falls 2 knee gave out in March, then 1 time fell out of the bed   LIVING ENVIRONMENT: Lives with: lives with their family Lives in: House/apartment Stairs: Yes: Internal: 14 steps; on right going up and the wall on the other side Has following equipment at home: Single point cane and Walker - 2 wheeled  OCCUPATION: N/A  PLOF: Independent with basic ADLs  PATIENT GOALS: to get rid of the pain, be able to walk and stand for longer than 2 mins without the pain  NEXT MD VISIT: 03/01/24  OBJECTIVE:  Note: Objective measures were completed at Evaluation unless otherwise noted.  DIAGNOSTIC FINDINGS:  X ray ordered for hips, lumbar, and knees   COGNITION: Overall cognitive status: Within functional limits for tasks assessed     SENSATION: WFL  POSTURE: rounded shoulders, forward head, and increased thoracic kyphosis  PALPATION: TTP and pain lumbar spine   LUMBAR ROM:   AROM eval 01/14/24 01/21/24 03/08/24 03/21/24  Flexion Distal shin with pain [facility]   WFL  Extension 25% available Limited 25% Limited 25% [facility] WFL  Right lateral flexion Mid thigh with pain Limited 25% Limited 25% 25% limited  WFL  Left lateral flexion Mid thigh with pain WFL   WFL  Right rotation 75% available with pain [facility]   WFL  Left rotation 75% available with pain WFL   WFL   (Blank rows = not tested)  LOWER EXTREMITY ROM:   grossly WFL   LOWER EXTREMITY MMT:  grossly 4-/5 BLE    FUNCTIONAL TESTS:  5 times sit to stand: 43s from elevated mat and needing UE push off, loses balance on to heels  Timed up and go (TUG): 24s   GAIT: Distance walked: in  clinic distances Assistive device utilized: None Level of assistance: CGA Comments: furniture walking, or reaches out to grab daughter's hand to walk  TREATMENT DATE:  07/04/24: Nustep L 5 x 4.30 min stopped due to SOB, coughing, pulse ox 79 Ran pt's portable fan, and purse lip breathing , increased to 91 Seated B hamstring curls 25#, 3 x 10 B knee ext 5 # , 2 x 10 Seated hip abd/ER with black t band 2 x 10 Supine for manual stretching by therapist, hamstrings, piriformis Physioball under thighs, for B knee to chest, LTR, bridging Rechecked O2 sats 91% after lying down  Improved up to 94 after seated pursed breathing.   06/09/24 NuStep L5x47mins Walking laps goal is 545ft Leg ext 10# 2x10 HS curls 25# 2x10 STS chest press with red ball 2x10 Passive stretching to bilateral LE Piriformis, HS, ITB, K2C, lower trunk rotation  05/19/24 SPO2 95% NuStep L5x15mins Leg ext 5# 2x12 HS curls 20# 2x12 Step ups 4  STS chest press with red ball 2x10 Passive stretching to bilateral LE Piriformis, HS, ITB, K2C, Double K2C, lower trunk rotation  05/12/24 NuStep L5 x 6 min HS cutls 20lb 2x10 Leg Ext 5lb 2x10 Sit to stand holding yellow ball 2x10 Seated rows green 2x10 Shoulder Ext green 2x10 Passive stretching to bilateral LE  Piriformis, HS, ITB, K2C  Double K2C, lower trunk rotation   05/10/24 Focus session on est new HEP she can do in bed to hoepfully increase compliance. Daughter present for carry over Access Code: [facility] URL: [email or web address] Date: 05/10/2024 Prepared by: Angela Payseur  Exercises - Supine Posterior Pelvic  Tilt  - 1 x daily - 7 x weekly - 3 sets - 10 reps - Supine Bridge  - 2 x daily - 7 x weekly - 1 sets - 10 reps - Supine Lower Trunk Rotation  - 2 x daily - 7 x weekly - 1 sets - 10 reps - Supine Hamstring Stretch with Strap  - 2 x daily - 7 x weekly - 2 sets - 5 reps - 10 hold - Supine March  - 2 x daily - 7 x weekly - 2 sets - 10  reps - Hooklying Abduction with Resistance  - 2 x daily - 7 x weekly - 1 sets - 10 reps  PROM BIL LE and trunk Nustep L 5   05/05/24 BP 123/59 HR 80 Bike L 3 x 6 min Goals Sit to stands 2x10 LE on thighs  LAQ 3lb 2x10 HS curls green 2x10 Passive stretching to bilateral HS, piriformis, Glute, ITB  04/14/24 Nustep L 6 Resisted gait 30# 4 ways 4 x each 10# cable row 10 x 5# cable shld ext 10x 5# cable hip 4 way 10 x- pain and weakness noted RT LB/glut with ex. Left side weakness with ext and abd but no pain Passive stretching to bilateral HS, piriformis, Glute, ITB   04/12/24 Nustep L 6 7 min STS 5 x 14.2 sec, assessed goals Knee ext 5# 2 sets 10 HS curl 20# 2 sets 10 STS with wt ball press 10x then 10 x OH Cable pulley shld ext 5# 2 sets 10 Resisted trunk ext 20 x Leg Press 30# 2 sets 10 Passive stretching to bilateral HS, piriformis, Glute, ITB    03/22/24 Bike L 3x 5 min Goals  5x sit tot stand 22.23 sec Rows & Ext green 2x10 Passive stretching to bilateral HS, piriformis, Glute, ITB Gait 148ft ~ 52.41 seconds to complete  03/08/24 NuStep L 5 x 6 min  HS curls 20lb 2x10 Leg Ext 5lb 2x10 Rows & Ext green 2x10 Passive stretching to bilateral HS, piriformis, Glute, ITB   02/11/24 NuStep L5x33mins Shoulder ext 5# 2x10 Step ups 4  Feet on ball rotations and knees to chest x10 Bridges 2x10 Passive stretching HS, glutes, SKTC, LTR STS 2x10   PATIENT EDUCATION:  Education details: POC, HEP, importance of movement Person educated: Patient and Child(ren) Education method: Explanation and Demonstration Education comprehension: verbalized understanding  HOME EXERCISE PROGRAM: Access Code: [email or web address] URL: [email or web address] Date: 01/12/2024 Prepared by: Tanda Sorrow  Exercises - Supine Lower Trunk Rotation  - 1 x daily - 7 x weekly - 2 sets - 10 reps - Seated March  - 1 x daily - 7 x weekly - 2 sets - 10 reps - Seated Hip Abduction  with Resistance  - 1 x daily - 7 x weekly - 2 sets - 10 reps - Sit to Stand  - 1 x daily - 7 x weekly - 2 sets - 5 reps - Supine Bridge  - 1 x daily - 7 x weekly - 3 sets - 10 reps - Standing Hip Abduction with Counter Support  - 1 x daily - 7 x weekly - 3 sets - 10 reps  ASSESSMENT:  CLINICAL IMPRESSION: Pt  attended first PT session after  3 week hiatus from PT.  She had a coughing episode while utilizing the Nustep and her O2 sats dropped, improved after careful recovery with pursed lip breathing.  She tolerated remainder of session well.  Using rollator for her mobility.  Plans to continue with PT for the remainder of her care plan  Patient is a 78 y.o. female who was seen today for physical therapy evaluation and treatment for balance, gait, low back and hip pain. She is ambulating without a device but is very unsteady and shuffles her feet. She was advised to use the [facility] at the least. Patient has a chronic cough that is pretty bad. Her daughter reports she is not complaint with HEP and spends most of the day sitting on the sofa. She presents with LE weakness and balance deficits that put her at a high fall risk. She states that her back and hip pain prevent her from doing anything. Patient is very wobbly when going from sit to stand especially after prolonged sitting. She will benefit from skilled PT to address her gait and balance impairments and decrease her hip and back pain to be more mobile.   OBJECTIVE IMPAIRMENTS: Abnormal gait, cardiopulmonary status limiting activity, decreased activity tolerance, decreased balance, decreased coordination, decreased endurance, decreased mobility, difficulty walking, decreased ROM, decreased strength, postural dysfunction, and pain.   ACTIVITY LIMITATIONS: lifting, bending, sitting, standing, squatting, transfers, and locomotion level  PARTICIPATION LIMITATIONS: meal prep, cleaning, laundry, shopping, and community activity  PERSONAL FACTORS: Age,  Behavior pattern, Past/current experiences, Time since onset of injury/illness/exacerbation, and 3+ comorbidities: CHF, ESRD on dialysis, neuropathy, chronic cough are also affecting patient's functional outcome.   REHAB POTENTIAL: Fair    CLINICAL DECISION MAKING: Stable/uncomplicated  EVALUATION COMPLEXITY: Low   GOALS: Goals reviewed with patient? Yes  SHORT TERM GOALS: Target date: 02/04/24  Patient will be independent with initial HEP. Baseline: given 12/24/23 Goal status: 01/07/24 MET  2.  Patient will demonstrate decreased fall risk by scoring < 15 sec on TUG. Baseline: 24s Goal status: 01/07/24 MET   LONG TERM GOALS: Target date: 07/21/24  Patient will be independent with advanced/ongoing HEP to improve outcomes and carryover.  Baseline:  Goal status: ongoing 03/22/24  04/12/24 MET  2.  Patient will be able to ambulate 500' with good safety to access community.  Baseline: not using AD, very unsteady and unable to walk or stand more than 2 mins Goal status: Progressing slow gait 03/08/24, ongoing 03/22/24  progressing 04/12/24, Ongoing 05/05/24, MET 06/09/24 REVISED GOAL: 848ft without SOB and fatigue   3.  Patient will report 50-75% improvement in R hip and low back pain Baseline: 10/10  Goal status: Progressing 7/10, Met 03/22/24  MET 04/12/24, back has been hurting 05/05/24, back has not hurt, it is better 05/19/24 MET  4.  Patient will demonstrate improved functional LE strength as demonstrated by 5xSTS <20s Baseline: 43s elevated mat and UE push off Goal status: Progressing 01/14/24, Progressing 01/21/24,  ongoing 03/21/24  MET 04/12/24  5.  Patient will increase standing and walking tolerance to 10 mins  Baseline: 2-5 mins  Goal status: Ongoing 3 min 03/08/24, ongoing 03/22/24  and 04/12/24, 05/05/24 ongoing, 05/12/24 ongoing, doing better but not that long 06/09/24    PLAN:  PT FREQUENCY: 2x/week  PT DURATION: 12 weeks  PLANNED INTERVENTIONS: 97110-Therapeutic exercises, 97530-  Therapeutic activity, 97112- Neuromuscular re-education, 97535- Self Care, [id number]- Manual therapy, [phone]- Gait training, [phone]- Electrical stimulation (unattended), Patient/Family education, Balance training, Stair training, Dry Needling, Joint mobilization, Spinal mobilization, Cryotherapy, and Moist heat.  PLAN FOR NEXT SESSION: check HEP compliance     Vara Mairena L Jaymen Fetch, PT, DPT, OCS 07/04/2024, 5:08 PM --------------------------------------------------------------------------------------------------------------------------------------------------------------

## 2024-07-05 DIAGNOSIS — N2581 Secondary hyperparathyroidism of renal origin: Secondary | ICD-10-CM | POA: Diagnosis not present

## 2024-07-05 DIAGNOSIS — Z992 Dependence on renal dialysis: Secondary | ICD-10-CM | POA: Diagnosis not present

## 2024-07-05 DIAGNOSIS — N186 End stage renal disease: Secondary | ICD-10-CM | POA: Diagnosis not present

## 2024-07-08 DIAGNOSIS — Z992 Dependence on renal dialysis: Secondary | ICD-10-CM | POA: Diagnosis not present

## 2024-07-08 DIAGNOSIS — N2581 Secondary hyperparathyroidism of renal origin: Secondary | ICD-10-CM | POA: Diagnosis not present

## 2024-07-08 DIAGNOSIS — N186 End stage renal disease: Secondary | ICD-10-CM | POA: Diagnosis not present

## 2024-07-10 DIAGNOSIS — Z992 Dependence on renal dialysis: Secondary | ICD-10-CM | POA: Diagnosis not present

## 2024-07-10 DIAGNOSIS — N186 End stage renal disease: Secondary | ICD-10-CM | POA: Diagnosis not present

## 2024-07-10 DIAGNOSIS — I129 Hypertensive chronic kidney disease with stage 1 through stage 4 chronic kidney disease, or unspecified chronic kidney disease: Secondary | ICD-10-CM | POA: Diagnosis not present

## 2024-07-12 ENCOUNTER — Ambulatory Visit (HOSPITAL_COMMUNITY)

## 2024-07-12 ENCOUNTER — Encounter (HOSPITAL_COMMUNITY): Payer: Self-pay

## 2024-07-12 ENCOUNTER — Other Ambulatory Visit (HOSPITAL_COMMUNITY): Payer: Self-pay

## 2024-07-12 ENCOUNTER — Ambulatory Visit: Admitting: Physical Therapy

## 2024-07-12 ENCOUNTER — Ambulatory Visit

## 2024-07-12 ENCOUNTER — Telehealth: Payer: Self-pay

## 2024-07-12 NOTE — Telephone Encounter (Signed)
 Spoke with pts daughter regarding EKG needed at Nurse visit that was cancelled by daughter. Pts daughter was made aware that per Dr Floretta, EKG can be done at next visit scheduled on 1/15 and there is no need to reschedule nurse visit. Pts daughter had no further questions at this time.

## 2024-07-13 ENCOUNTER — Other Ambulatory Visit: Payer: Self-pay

## 2024-07-13 ENCOUNTER — Encounter: Payer: Self-pay | Admitting: Pharmacist

## 2024-07-14 ENCOUNTER — Ambulatory Visit (HOSPITAL_COMMUNITY)
Admission: RE | Admit: 2024-07-14 | Discharge: 2024-07-14 | Attending: Student in an Organized Health Care Education/Training Program

## 2024-07-14 DIAGNOSIS — R0602 Shortness of breath: Secondary | ICD-10-CM | POA: Insufficient documentation

## 2024-07-18 ENCOUNTER — Other Ambulatory Visit: Payer: Self-pay

## 2024-07-19 ENCOUNTER — Ambulatory Visit: Attending: Family Medicine

## 2024-07-19 ENCOUNTER — Other Ambulatory Visit (HOSPITAL_COMMUNITY): Payer: Self-pay

## 2024-07-19 ENCOUNTER — Other Ambulatory Visit: Payer: Self-pay

## 2024-07-19 DIAGNOSIS — M5459 Other low back pain: Secondary | ICD-10-CM | POA: Insufficient documentation

## 2024-07-19 DIAGNOSIS — M25551 Pain in right hip: Secondary | ICD-10-CM | POA: Diagnosis present

## 2024-07-19 DIAGNOSIS — R262 Difficulty in walking, not elsewhere classified: Secondary | ICD-10-CM | POA: Diagnosis present

## 2024-07-19 DIAGNOSIS — M6281 Muscle weakness (generalized): Secondary | ICD-10-CM | POA: Insufficient documentation

## 2024-07-19 NOTE — Therapy (Addendum)
 OUTPATIENT PHYSICAL THERAPY THORACOLUMBAR     Patient Name: Paula [name]  MRN: [id number] DOB:02/21/1946, 78 y.o., female Today's Date: 07/19/2024  END OF SESSION:  PT End of Session - 07/19/24 1551     Visit Number 19    Date for Recertification  07/21/24    Authorization Type [employer]    PT Start Time 1551    PT Stop Time 1630    PT Time Calculation (min) 39 min    Activity Tolerance Patient tolerated treatment well    Behavior During Therapy [facility] for tasks assessed/performed                 Past Medical History:  Diagnosis Date   Allergic rhinitis    Allergy  1/12000   Arthritis    Asthma    Brain aneurysm    Cough    Diabetes mellitus    Diastolic CHF, chronic (HCC) 10/11/2011   ESRD on hemodialysis (HCC)    MWF   GERD (gastroesophageal reflux disease)    History of colon polyps 2012   tubular adenoma    Hyperlipidemia    Hypertension    Neuropathy 10/11/2011   PONV (postoperative nausea and vomiting)    one time after lymph node surgery   Past Surgical History:  Procedure Laterality Date   AV FISTULA PLACEMENT Right 10/15/2021   Procedure: ARTERIOVENOUS (AV) FISTULA  WITH PLACEMENT OF GORE-TEX STRETCH  GRAFT (4-27mmx45cm);  Surgeon: Eliza Lonni RAMAN, MD;  Location: [facility] OR;  Service: Vascular;  Laterality: Right;   CATARACT EXTRACTION     right eye   IR 3D INDEPENDENT WKST  12/14/2017   IR ANGIO INTRA EXTRACRAN SEL COM CAROTID INNOMINATE BILAT MOD SED  09/15/2017   IR ANGIO INTRA EXTRACRAN SEL COM CAROTID INNOMINATE UNI L MOD SED  12/14/2017   IR ANGIO VERTEBRAL SEL VERTEBRAL BILAT MOD SED  09/15/2017   IR RADIOLOGIST EVAL & MGMT  09/10/2017   IR RADIOLOGIST EVAL & MGMT  10/19/2017   lymphatic mass surgery     NASAL TURBINATE REDUCTION     RADIOLOGY WITH ANESTHESIA N/A 12/14/2017   Procedure: RADIOLOGY WITH ANESTHESIA EMBOLIZATION;  Surgeon: Dolphus Carrion, MD;  Location: [facility] OR;  Service: Radiology;  Laterality: N/A;   VIDEO BRONCHOSCOPY  Bilateral 11/18/2017   Procedure: VIDEO BRONCHOSCOPY WITHOUT FLUORO;  Surgeon: Shelah Lamar RAMAN, MD;  Location: [facility] ENDOSCOPY;  Service: Cardiopulmonary;  Laterality: Bilateral;   Patient Active Problem List   Diagnosis Date Noted   Hypoxemia 12/29/2023   Abnormal electrocardiogram (ECG) (EKG) 12/29/2023   Left ventricular hypertrophy 12/29/2023   CHF (congestive heart failure) (HCC) 10/12/2023   Blurry vision 10/11/2023   Anemia due to chronic kidney disease 10/11/2023   Acute respiratory failure with hypoxia (HCC) 08/17/2023   PNA (pneumonia) 08/17/2023   Volume overload 08/17/2023   Acute hypoxic respiratory failure (HCC) 08/17/2023   SNHL (sensory-neural hearing loss), 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   ESRD on dialysis (HCC) 11/20/2022   Mold exposure 08/14/2022   Opioid withdrawal (HCC) 08/14/2022   ILD (interstitial lung disease) (HCC) 08/14/2022   Secondary hyperparathyroidism, renal 08/14/2022   Paroxysmal A-fib (HCC) 05/20/2022   Hypertrophy of nasal turbinates 09/05/2021   Chronic kidney disease, stage 5 (HCC)    Aortic atherosclerosis 06/24/2021   Chronic rhinitis 10/01/2017   Seasonal allergic conjunctivitis 10/01/2017   Aneurysm 09/24/2017   Acute metabolic encephalopathy 09/02/2017   Allergic rhinitis 08/20/2017   Palpitations 07/10/2016   Gout 01/23/2016   History of cataract 02/27/2015   Seasonal  and perennial allergic rhinitis 02/15/2012   Chronic diastolic CHF (congestive heart failure) (HCC) 10/11/2011   GERD (gastroesophageal reflux disease)    Type 2 diabetes mellitus without complication, without long-term current use of insulin  (HCC) 07/03/2010   Hyperlipidemia 07/03/2010   Essential hypertension 07/03/2010    PCP: Corrina Sabin  REFERRING PROVIDER: Corrina Sabin  REFERRING DIAG: M54.50,G89.29 (ICD-10-CM) - Chronic right-sided low back pain without sciaticaM25.551 (ICD-10-CM) - Right hip pain  Rationale for Evaluation and Treatment:  Rehabilitation  THERAPY DIAG:  Muscle weakness (generalized)  Difficulty in walking, not elsewhere classified  Other low back pain  ONSET DATE: 10/11/23  SUBJECTIVE:                                                                                                                                                                                           SUBJECTIVE STATEMENT: I made it here by the grace of God. My back is still hurting.    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   She experiences pain in the lower back, specifically in the right hip and lower back, which makes standing for more than two minutes difficult. The pain is localized to the hip area without radiation down the leg. She is receiving physical therapy at home and is interested in outpatient therapy. Tylenol  and a heating pad provide pain relief. Hip x-ray from 2024 revealed degenerative changes send pubic symphysis, both hips, SI joints and lower lumbar spine.  She fell from her bed last Monday, resulting in pain on the left side of her head and right shoulder, which was swollen but has improved. No current swelling or pain with pressure. She experiences dizziness at times and uses a cane occasionally.    PAIN:  Are you having pain? Yes: NPRS scale: 0/10 low back pain Pain location: R hip and midline low back  Pain description: not there all the time, if I am sitting I am okay Aggravating factors: when I get up, when I am  standing, walking  Relieving factors: Tylenol  and gabapentin ,   PRECAUTIONS: Fall  RED FLAGS: None   WEIGHT BEARING RESTRICTIONS: No  FALLS:  Has patient fallen in last 6 months? Yes. Number  of falls 2 knee gave out in March, then 1 time fell out of the bed   LIVING ENVIRONMENT: Lives with: lives with their family Lives in: House/apartment Stairs: Yes: Internal: 14 steps; on right going up and the wall on the other side Has following equipment at home: Single point cane and Walker - 2 wheeled  OCCUPATION: N/A  PLOF: Independent with basic ADLs  PATIENT GOALS: to get rid of the pain, be able to walk and stand for longer than 2 mins without the pain  NEXT MD VISIT: 03/01/24  OBJECTIVE:  Note: Objective measures were completed at Evaluation unless otherwise noted.  DIAGNOSTIC FINDINGS:  X ray ordered for hips, lumbar, and knees   COGNITION: Overall cognitive status: Within functional limits for tasks assessed     SENSATION: WFL  POSTURE: rounded shoulders, forward head, and increased thoracic kyphosis  PALPATION: TTP and pain lumbar spine   LUMBAR ROM:   AROM eval 01/14/24 01/21/24 03/08/24 03/21/24  Flexion Distal shin with pain [facility]   WFL  Extension 25% available Limited 25% Limited 25% [facility] WFL  Right lateral flexion Mid thigh with pain Limited 25% Limited 25% 25% limited  WFL  Left lateral flexion Mid thigh with pain WFL   WFL  Right rotation 75% available with pain [facility]   WFL  Left rotation 75% available with pain WFL   WFL   (Blank rows = not tested)  LOWER EXTREMITY ROM:   grossly WFL   LOWER EXTREMITY MMT:  grossly 4-/5 BLE    FUNCTIONAL TESTS:  5 times sit to stand: 43s from elevated mat and needing UE push off, loses balance on to heels  Timed up and go (TUG): 24s   GAIT: Distance walked: in clinic distances Assistive device utilized: None Level of assistance: CGA Comments: furniture walking, or reaches out to grab daughter's hand to  walk  TREATMENT DATE:  07/19/24 Recheck goals  NuStep L5x51mins HS curls 25# 2x10 Leg ext 10# 2x10 Passive stretching to bilateral LE Piriformis, HS, ITB, K2C, lower trunk rotation Supine clamshells x10 each side Feet on ball rotations, knees to chest   07/04/24: Nustep L 5 x 4.30 min stopped due to SOB, coughing, pulse ox 79 Ran pt's portable fan, and purse lip breathing , increased to 91 Seated B hamstring curls 25#, 3 x 10 B knee ext 5 # , 2 x 10 Seated hip abd/ER with black t band 2 x 10 Supine for manual stretching by therapist, hamstrings, piriformis Physioball under thighs, for B knee to chest, LTR, bridging Rechecked O2 sats 91% after lying down  Improved up to 94 after seated pursed breathing.   06/09/24 NuStep L5x82mins Walking laps goal is 539ft Leg ext 10# 2x10 HS curls 25# 2x10 STS chest press with red ball 2x10 Passive stretching to bilateral LE Piriformis, HS, ITB, K2C, lower trunk rotation  05/19/24 SPO2 95% NuStep L5x56mins Leg ext 5# 2x12 HS curls 20# 2x12 Step ups 4  STS chest press with red ball 2x10 Passive stretching to bilateral LE Piriformis, HS, ITB, K2C, Double K2C, lower trunk rotation  05/12/24 NuStep L5 x 6 min HS cutls 20lb 2x10 Leg Ext 5lb 2x10 Sit to stand holding yellow ball 2x10 Seated rows green 2x10 Shoulder Ext green 2x10 Passive stretching to bilateral LE  Piriformis, HS, ITB, K2C  Double K2C, lower trunk rotation   05/10/24 Focus session on est new HEP she can do in bed to hoepfully increase compliance. Daughter present for carry over Access  Code: [email or web address] URL: [email or web address] Date: 05/10/2024 Prepared by: Angela Payseur  Exercises - Supine Posterior Pelvic Tilt  - 1 x daily - 7 x weekly - 3 sets - 10 reps - Supine Bridge  - 2 x daily - 7 x weekly - 1 sets - 10 reps - Supine Lower Trunk Rotation  - 2 x daily - 7 x weekly - 1 sets - 10 reps - Supine Hamstring Stretch with Strap  - 2 x daily - 7 x  weekly - 2 sets - 5 reps - 10 hold - Supine March  - 2 x daily - 7 x weekly - 2 sets - 10 reps - Hooklying Abduction with Resistance  - 2 x daily - 7 x weekly - 1 sets - 10 reps  PROM BIL LE and trunk Nustep L 5   05/05/24 BP 123/59 HR 80 Bike L 3 x 6 min Goals Sit to stands 2x10 LE on thighs  LAQ 3lb 2x10 HS curls green 2x10 Passive stretching to bilateral HS, piriformis, Glute, ITB  04/14/24 Nustep L 6 Resisted gait 30# 4 ways 4 x each 10# cable row 10 x 5# cable shld ext 10x 5# cable hip 4 way 10 x- pain and weakness noted RT LB/glut with ex. Left side weakness with ext and abd but no pain Passive stretching to bilateral HS, piriformis, Glute, ITB   04/12/24 Nustep L 6 7 min STS 5 x 14.2 sec, assessed goals Knee ext 5# 2 sets 10 HS curl 20# 2 sets 10 STS with wt ball press 10x then 10 x OH Cable pulley shld ext 5# 2 sets 10 Resisted trunk ext 20 x Leg Press 30# 2 sets 10 Passive stretching to bilateral HS, piriformis, Glute, ITB    03/22/24 Bike L 3x 5 min Goals  5x sit tot stand 22.23 sec Rows & Ext green 2x10 Passive stretching to bilateral HS, piriformis, Glute, ITB Gait 170ft ~ 52.41 seconds to complete  03/08/24 NuStep L 5 x 6 min  HS curls 20lb 2x10 Leg Ext 5lb 2x10 Rows & Ext green 2x10 Passive stretching to bilateral HS, piriformis, Glute, ITB   02/11/24 NuStep L5x63mins Shoulder ext 5# 2x10 Step ups 4  Feet on ball rotations and knees to chest x10 Bridges 2x10 Passive stretching HS, glutes, SKTC, LTR STS 2x10   PATIENT EDUCATION:  Education details: POC, HEP, importance of movement Person educated: Patient and Child(ren) Education method: Explanation and Demonstration Education comprehension: verbalized understanding  HOME EXERCISE PROGRAM: Access Code: [email or web address] URL: [email or web address] Date: 01/12/2024 Prepared by: Tanda Sorrow  Exercises - Supine Lower Trunk Rotation  - 1 x daily - 7 x weekly - 2 sets -  10 reps - Seated March  - 1 x daily - 7 x weekly - 2 sets - 10 reps - Seated Hip Abduction with Resistance  - 1 x daily - 7 x weekly - 2 sets - 10 reps - Sit to Stand  - 1 x daily - 7 x weekly - 2 sets - 5 reps - Supine Bridge  - 1 x daily - 7 x weekly - 3 sets - 10 reps - Standing Hip Abduction with Counter Support  - 1 x daily - 7 x weekly - 3 sets - 10 reps  ASSESSMENT:  CLINICAL IMPRESSION: Pt returns to PT after ~2 weeks. She is able to walk for 661ft with some SOB and reports her knees feels like they are giving out. She was able to tolerate session  well with only 1 brief break needed due to coughing.  Plans to continue with PT for another month as patient would like to continue to work on strengthening for her knees and stretching for her low back.   Patient is a 78 y.o. female who was seen today for physical therapy evaluation and treatment for balance, gait, low back and hip pain. She is ambulating without a device but is very unsteady and shuffles her feet. She was advised to use the [facility] at the least. Patient has a chronic cough that is pretty bad. Her daughter reports she is not complaint with HEP and spends most of the day sitting on the sofa. She presents with LE weakness and balance deficits that put her at a high fall risk. She states that her back and hip pain prevent her from doing anything. Patient is very wobbly when going from sit to stand especially after prolonged sitting. She will benefit from skilled PT to address her gait and balance impairments and decrease her hip and back pain to be more mobile.   OBJECTIVE IMPAIRMENTS: Abnormal gait, cardiopulmonary status limiting activity, decreased activity tolerance, decreased balance, decreased coordination, decreased endurance, decreased mobility, difficulty walking, decreased ROM, decreased strength, postural dysfunction, and pain.   ACTIVITY LIMITATIONS: lifting, bending, sitting, standing, squatting, transfers, and locomotion  level  PARTICIPATION LIMITATIONS: meal prep, cleaning, laundry, shopping, and community activity  PERSONAL FACTORS: Age, Behavior pattern, Past/current experiences, Time since onset of injury/illness/exacerbation, and 3+ comorbidities: CHF, ESRD on dialysis, neuropathy, chronic cough are also affecting patient's functional outcome.   REHAB POTENTIAL: Fair    CLINICAL DECISION MAKING: Stable/uncomplicated  EVALUATION COMPLEXITY: Low   GOALS: Goals reviewed with patient? Yes  SHORT TERM GOALS: Target date: 02/04/24  Patient will be independent with initial HEP. Baseline: given 12/24/23 Goal status: 01/07/24 MET  2.  Patient will demonstrate decreased fall risk by scoring < 15 sec on TUG. Baseline: 24s Goal status: 01/07/24 MET   LONG TERM GOALS: Target date: 09/12/24  Patient will be independent with advanced/ongoing HEP to improve outcomes and carryover.  Baseline:  Goal status: ongoing 03/22/24  04/12/24 MET  2.  Patient will be able to ambulate 500' with good safety to access community.  Baseline: not using AD, very unsteady and unable to walk or stand more than 2 mins Goal status: Progressing slow gait 03/08/24, ongoing 03/22/24  progressing 04/12/24, Ongoing 05/05/24, MET 06/09/24 REVISED GOAL: 824ft without SOB and fatigue -661ft progressing 07/19/24  3.  Patient will report 50-75% improvement in R hip and low back pain Baseline: 10/10  Goal status: Progressing 7/10, Met 03/22/24  MET 04/12/24, back has been hurting 05/05/24, back has not hurt, it is better 05/19/24 MET  4.  Patient will demonstrate improved functional LE strength as demonstrated by 5xSTS <20s Baseline: 43s elevated mat and UE push off Goal status: Progressing 01/14/24, Progressing 01/21/24,  ongoing 03/21/24  MET 04/12/24  5.  Patient will increase standing and walking tolerance to 10 mins  Baseline: 2-5 mins  Goal status: Ongoing 3 min 03/08/24, ongoing 03/22/24  and 04/12/24, 05/05/24 ongoing, 05/12/24 ongoing, doing better  but not that long 06/09/24, 8 mins 07/19/24   PLAN:  PT FREQUENCY: 2x/week  PT DURATION: 12 weeks  PLANNED INTERVENTIONS: 97110-Therapeutic exercises, 97530- Therapeutic activity, 97112- Neuromuscular re-education, 97535- Self Care, [id number]- Manual therapy, [phone]- Gait training, [phone]- Electrical stimulation (unattended), Patient/Family education, Balance training, Stair training, Dry Needling, Joint mobilization, Spinal mobilization, Cryotherapy, and Moist heat.  PLAN FOR  NEXT SESSION: check HEP compliance     Almetta Fam, PT, DPT, OCS 07/19/2024, 4:34 PM --------------------------------------------------------------------------------------------------------------------------------------------------------------

## 2024-07-19 NOTE — Addendum Note (Signed)
 Addended by: JANIT FORMICA on: 07/19/2024 04:36 PM   Modules accepted: Orders

## 2024-07-22 ENCOUNTER — Encounter: Payer: Self-pay | Admitting: Family Medicine

## 2024-07-26 ENCOUNTER — Ambulatory Visit: Payer: Self-pay | Admitting: Student in an Organized Health Care Education/Training Program

## 2024-07-28 ENCOUNTER — Ambulatory Visit: Admitting: Physical Therapy

## 2024-07-28 DIAGNOSIS — M5459 Other low back pain: Secondary | ICD-10-CM

## 2024-07-28 DIAGNOSIS — R262 Difficulty in walking, not elsewhere classified: Secondary | ICD-10-CM

## 2024-07-28 DIAGNOSIS — M6281 Muscle weakness (generalized): Secondary | ICD-10-CM

## 2024-07-28 NOTE — Therapy (Addendum)
 OUTPATIENT PHYSICAL THERAPY THORACOLUMBAR   Progress Note Reporting Period 03/22/24 to 07/28/25  See note below for Objective Data and Assessment of Progress/Goals.      Patient Name: Paula [name]  MRN: [id number] DOB:28-Dec-1945, 78 y.o., female Today's Date: 07/28/2024  END OF SESSION:  PT End of Session - 07/28/24 1452     Visit Number 20    Date for Recertification  09/12/24    Authorization Type [employer]    PT Start Time 1455    PT Stop Time 1530    PT Time Calculation (min) 35 min                 Past Medical History:  Diagnosis Date   Allergic rhinitis    Allergy  1/12000   Arthritis    Asthma    Brain aneurysm    Cough    Diabetes mellitus    Diastolic CHF, chronic (HCC) 10/11/2011   ESRD on hemodialysis (HCC)    MWF   GERD (gastroesophageal reflux disease)    History of colon polyps 2012   tubular adenoma    Hyperlipidemia    Hypertension    Neuropathy 10/11/2011   PONV (postoperative nausea and vomiting)    one time after lymph node surgery   Past Surgical History:  Procedure Laterality Date   AV FISTULA PLACEMENT Right 10/15/2021   Procedure: ARTERIOVENOUS (AV) FISTULA  WITH PLACEMENT OF GORE-TEX STRETCH  GRAFT (4-81mmx45cm);  Surgeon: Eliza Lonni RAMAN, MD;  Location: [facility] OR;  Service: Vascular;  Laterality: Right;   CATARACT EXTRACTION     right eye   IR 3D INDEPENDENT WKST  12/14/2017   IR ANGIO INTRA EXTRACRAN SEL COM CAROTID INNOMINATE BILAT MOD SED  09/15/2017   IR ANGIO INTRA EXTRACRAN SEL COM CAROTID INNOMINATE UNI L MOD SED  12/14/2017   IR ANGIO VERTEBRAL SEL VERTEBRAL BILAT MOD SED  09/15/2017   IR RADIOLOGIST EVAL & MGMT  09/10/2017   IR RADIOLOGIST EVAL & MGMT  10/19/2017   lymphatic mass surgery     NASAL TURBINATE REDUCTION     RADIOLOGY WITH ANESTHESIA N/A 12/14/2017   Procedure: RADIOLOGY WITH ANESTHESIA EMBOLIZATION;  Surgeon: Dolphus Carrion, MD;  Location: [facility] OR;  Service: Radiology;  Laterality: N/A;   VIDEO  BRONCHOSCOPY Bilateral 11/18/2017   Procedure: VIDEO BRONCHOSCOPY WITHOUT FLUORO;  Surgeon: Shelah Lamar RAMAN, MD;  Location: [facility];  Service: Cardiopulmonary;  Laterality: Bilateral;   Patient Active Problem List   Diagnosis Date Noted   Hypoxemia 12/29/2023   Abnormal electrocardiogram (ECG) (EKG) 12/29/2023   Left ventricular hypertrophy 12/29/2023   CHF (congestive heart failure) (HCC) 10/12/2023   Blurry vision 10/11/2023   Anemia due to chronic kidney disease 10/11/2023   Acute respiratory failure with hypoxia (HCC) 08/17/2023   PNA (pneumonia) 08/17/2023   Volume overload 08/17/2023   Acute hypoxic respiratory failure (HCC) 08/17/2023   SNHL (sensory-neural hearing loss), 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   ESRD on dialysis (HCC) 11/20/2022   Mold exposure 08/14/2022   Opioid withdrawal (HCC) 08/14/2022   ILD (interstitial lung disease) (HCC) 08/14/2022   Secondary hyperparathyroidism, renal 08/14/2022   Paroxysmal A-fib (HCC) 05/20/2022   Hypertrophy of nasal turbinates 09/05/2021   Chronic kidney disease, stage 5 (HCC)    Aortic atherosclerosis 06/24/2021   Chronic rhinitis 10/01/2017   Seasonal allergic conjunctivitis 10/01/2017   Aneurysm 09/24/2017   Acute metabolic encephalopathy 09/02/2017   Allergic rhinitis 08/20/2017   Palpitations 07/10/2016   Gout 01/23/2016   History of cataract 02/27/2015  Seasonal and perennial allergic rhinitis 02/15/2012   Chronic diastolic CHF (congestive heart failure) (HCC) 10/11/2011   GERD (gastroesophageal reflux disease)    Type 2 diabetes mellitus without complication, without long-term current use of insulin  (HCC) 07/03/2010   Hyperlipidemia 07/03/2010   Essential hypertension 07/03/2010    PCP: Corrina Sabin  REFERRING PROVIDER: Corrina Sabin  REFERRING DIAG: M54.50,G89.29 (ICD-10-CM) - Chronic right-sided low back pain without sciaticaM25.551 (ICD-10-CM) - Right hip pain  Rationale for Evaluation and Treatment:  Rehabilitation  THERAPY DIAG:  Muscle weakness (generalized)  Difficulty in walking, not elsewhere classified  Other low back pain  ONSET DATE: 10/11/23  SUBJECTIVE:                                                                                                                                                                                           SUBJECTIVE STATEMENT: 10 min late. 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   She experiences pain in the lower back, specifically in the right hip and lower back, which makes standing for more than two minutes difficult. The pain is localized to the hip area without radiation down the leg. She is receiving physical therapy at home and is interested in outpatient therapy. Tylenol  and a heating pad provide pain relief. Hip x-ray from 2024 revealed degenerative changes send pubic symphysis, both hips, SI joints and lower lumbar spine.  She fell from her bed last Monday, resulting in pain on the left side of her head and right shoulder, which was swollen but has improved. No current swelling or pain with pressure. She experiences dizziness at times and uses a cane occasionally.    PAIN:  Are you having pain? Yes: NPRS scale: 0/10 low back pain Pain location: R hip and midline low back  Pain description: not there all the time, if I am sitting I am okay Aggravating  factors: when I get up, when I am standing, walking  Relieving factors: Tylenol  and gabapentin ,   PRECAUTIONS: Fall  RED FLAGS: None   WEIGHT BEARING RESTRICTIONS: No  FALLS:  Has patient fallen in last  6 months? Yes. Number of falls 2 knee gave out in March, then 1 time fell out of the bed   LIVING ENVIRONMENT: Lives with: lives with their family Lives in: House/apartment Stairs: Yes: Internal: 14 steps; on right going up and the wall on the other side Has following equipment at home: Single point cane and Walker - 2 wheeled  OCCUPATION: N/A  PLOF: Independent with basic ADLs  PATIENT GOALS: to get rid of the pain, be able to walk and stand for longer than 2 mins without the pain  NEXT MD VISIT: 03/01/24  OBJECTIVE:  Note: Objective measures were completed at Evaluation unless otherwise noted.  DIAGNOSTIC FINDINGS:  X ray ordered for hips, lumbar, and knees   COGNITION: Overall cognitive status: Within functional limits for tasks assessed     SENSATION: WFL  POSTURE: rounded shoulders, forward head, and increased thoracic kyphosis  PALPATION: TTP and pain lumbar spine   LUMBAR ROM:   AROM eval 01/14/24 01/21/24 03/08/24 03/21/24  Flexion Distal shin with pain [facility]   WFL  Extension 25% available Limited 25% Limited 25% [facility] WFL  Right lateral flexion Mid thigh with pain Limited 25% Limited 25% 25% limited  WFL  Left lateral flexion Mid thigh with pain WFL   WFL  Right rotation 75% available with pain [facility]   WFL  Left rotation 75% available with pain WFL   WFL   (Blank rows = not tested)  LOWER EXTREMITY ROM:   grossly WFL   LOWER EXTREMITY MMT:  grossly 4-/5 BLE    FUNCTIONAL TESTS:  5 times sit to stand: 43s from elevated mat and needing UE push off, loses balance on to heels  Timed up and go (TUG): 24s   GAIT: Distance walked: in clinic distances Assistive device utilized: None Level of assistance: CGA Comments: furniture walking, or reaches out to grab  daughter's hand to walk  TREATMENT DATE:   07/28/24 Nustep L 5 5 min HS curls 25# 2x10 Leg ext 10# 2x10 STS with yellow wt ball chest press 10 x Supine- bridge with ADD ball squeeze, green tband clams with bridge, SL clams, alt marching green tband 10 x each  Passive stretching to bilateral LE Piriformis, HS, ITB, K2C, lower trunk rotation   07/19/24 Recheck goals  NuStep L5x82mins HS curls 25# 2x10 Leg ext 10# 2x10 Passive stretching to bilateral LE Piriformis, HS, ITB, K2C, lower trunk rotation Supine clamshells x10 each side Feet on ball rotations, knees to chest   07/04/24: Nustep L 5 x 4.30 min stopped due to SOB, coughing, pulse ox 79 Ran pt's portable fan, and purse lip breathing , increased to 91 Seated B hamstring curls 25#, 3 x 10 B knee ext 5 # , 2 x 10 Seated hip abd/ER with black t band 2 x 10 Supine for manual stretching by therapist, hamstrings, piriformis Physioball under thighs, for B knee to chest, LTR, bridging Rechecked O2 sats 91% after lying down  Improved up to 94 after seated pursed breathing.   06/09/24 NuStep L5x5mins Walking laps goal is 550ft Leg ext 10# 2x10 HS curls 25# 2x10 STS chest press with red ball 2x10 Passive stretching to bilateral LE Piriformis, HS, ITB, K2C, lower trunk rotation  05/19/24 SPO2 95% NuStep L5x66mins Leg ext 5# 2x12 HS curls 20# 2x12 Step ups 4  STS chest press with red ball 2x10 Passive stretching to bilateral LE Piriformis, HS, ITB, K2C, Double K2C, lower trunk rotation  05/12/24 NuStep L5 x 6 min  HS cutls 20lb 2x10 Leg Ext 5lb 2x10 Sit to stand holding yellow ball 2x10 Seated rows green 2x10 Shoulder Ext green 2x10 Passive stretching to bilateral LE  Piriformis, HS, ITB, K2C  Double K2C, lower trunk rotation   05/10/24 Focus session on est new HEP she can do in bed to hoepfully increase compliance. Daughter present for carry over Access Code: [facility] URL: [email or web address] Date:  05/10/2024 Prepared by: Jamez Ambrocio  Exercises - Supine Posterior Pelvic Tilt  - 1 x daily - 7 x weekly - 3 sets - 10 reps - Supine Bridge  - 2 x daily - 7 x weekly - 1 sets - 10 reps - Supine Lower Trunk Rotation  - 2 x daily - 7 x weekly - 1 sets - 10 reps - Supine Hamstring Stretch with Strap  - 2 x daily - 7 x weekly - 2 sets - 5 reps - 10 hold - Supine March  - 2 x daily - 7 x weekly - 2 sets - 10 reps - Hooklying Abduction with Resistance  - 2 x daily - 7 x weekly - 1 sets - 10 reps  PROM BIL LE and trunk Nustep L 5   05/05/24 BP 123/59 HR 80 Bike L 3 x 6 min Goals Sit to stands 2x10 LE on thighs  LAQ 3lb 2x10 HS curls green 2x10 Passive stretching to bilateral HS, piriformis, Glute, ITB  04/14/24 Nustep L 6 Resisted gait 30# 4 ways 4 x each 10# cable row 10 x 5# cable shld ext 10x 5# cable hip 4 way 10 x- pain and weakness noted RT LB/glut with ex. Left side weakness with ext and abd but no pain Passive stretching to bilateral HS, piriformis, Glute, ITB   04/12/24 Nustep L 6 7 min STS 5 x 14.2 sec, assessed goals Knee ext 5# 2 sets 10 HS curl 20# 2 sets 10 STS with wt ball press 10x then 10 x OH Cable pulley shld ext 5# 2 sets 10 Resisted trunk ext 20 x Leg Press 30# 2 sets 10 Passive stretching to bilateral HS, piriformis, Glute, ITB    03/22/24 Bike L 3x 5 min Goals  5x sit tot stand 22.23 sec Rows & Ext green 2x10 Passive stretching to bilateral HS, piriformis, Glute, ITB Gait 135ft ~ 52.41 seconds to complete  03/08/24 NuStep L 5 x 6 min  HS curls 20lb 2x10 Leg Ext 5lb 2x10 Rows & Ext green 2x10 Passive stretching to bilateral HS, piriformis, Glute, ITB   02/11/24 NuStep L5x73mins Shoulder ext 5# 2x10 Step ups 4  Feet on ball rotations and knees to chest x10 Bridges 2x10 Passive stretching HS, glutes, SKTC, LTR STS 2x10   PATIENT EDUCATION:  Education details: POC, HEP, importance of movement Person educated: Patient and  Child(ren) Education method: Explanation and Demonstration Education comprehension: verbalized understanding  HOME EXERCISE PROGRAM: Access Code: [email or web address] URL: [email or web address] Date: 01/12/2024 Prepared by: Tanda Sorrow  Exercises - Supine Lower Trunk Rotation  - 1 x daily - 7 x weekly - 2 sets - 10 reps - Seated March  - 1 x daily - 7 x weekly - 2 sets - 10 reps - Seated Hip Abduction with Resistance  - 1 x daily - 7 x weekly - 2 sets - 10 reps - Sit to Stand  - 1 x daily - 7 x weekly - 2 sets - 5 reps - Supine Bridge  - 1 x daily - 7 x weekly - 3 sets -  10 reps - Standing Hip Abduction with Counter Support  - 1 x daily - 7 x weekly - 3 sets - 10 reps  ASSESSMENT:  CLINICAL IMPRESSION: Pt arrived 10 min late. Verb she bought an exercise ball and doing HEP when not at MD appts or dialysis..Pt tolerated session fair with some SOB and coughing throughout. Cuing needed with ex. Assessed goals.    Patient is a 78 y.o. female who was seen today for physical therapy evaluation and treatment for balance, gait, low back and hip pain. She is ambulating without a device but is very unsteady and shuffles her feet. She was advised to use the [facility] at the least. Patient has a chronic cough that is pretty bad. Her daughter reports she is not complaint with HEP and spends most of the day sitting on the sofa. She presents with LE weakness and balance deficits that put her at a high fall risk. She states that her back and hip pain prevent her from doing anything. Patient is very wobbly when going from sit to stand especially after prolonged sitting. She will benefit from skilled PT to address her gait and balance impairments and decrease her hip and back pain to be more mobile.   OBJECTIVE IMPAIRMENTS: Abnormal gait, cardiopulmonary status limiting activity, decreased activity tolerance, decreased balance, decreased coordination, decreased endurance, decreased mobility, difficulty  walking, decreased ROM, decreased strength, postural dysfunction, and pain.   ACTIVITY LIMITATIONS: lifting, bending, sitting, standing, squatting, transfers, and locomotion level  PARTICIPATION LIMITATIONS: meal prep, cleaning, laundry, shopping, and community activity  PERSONAL FACTORS: Age, Behavior pattern, Past/current experiences, Time since onset of injury/illness/exacerbation, and 3+ comorbidities: CHF, ESRD on dialysis, neuropathy, chronic cough are also affecting patient's functional outcome.   REHAB POTENTIAL: Fair    CLINICAL DECISION MAKING: Stable/uncomplicated  EVALUATION COMPLEXITY: Low   GOALS: Goals reviewed with patient? Yes  SHORT TERM GOALS: Target date: 02/04/24  Patient will be independent with initial HEP. Baseline: given 12/24/23 Goal status: 01/07/24 MET  2.  Patient will demonstrate decreased fall risk by scoring < 15 sec on TUG. Baseline: 24s Goal status: 01/07/24 MET   LONG TERM GOALS: Target date: 09/12/24  Patient will be independent with advanced/ongoing HEP to improve outcomes and carryover.  Baseline:  Goal status: ongoing 03/22/24  04/12/24 MET  2.  Patient will be able to ambulate 500' with good safety to access community.  Baseline: not using AD, very unsteady and unable to walk or stand more than 2 mins Goal status: Progressing slow gait 03/08/24, ongoing 03/22/24  progressing 04/12/24, Ongoing 05/05/24, MET 06/09/24 REVISED GOAL: 853ft without SOB and fatigue -696ft progressing 07/19/24 and 07/28/24  3.  Patient will report 50-75% improvement in R hip and low back pain Baseline: 10/10  Goal status: Progressing 7/10, Met 03/22/24  MET 04/12/24, back has been hurting 05/05/24, back has not hurt, it is better 05/19/24 MET  4.  Patient will demonstrate improved functional LE strength as demonstrated by 5xSTS <20s Baseline: 43s elevated mat and UE push off Goal status: Progressing 01/14/24, Progressing 01/21/24,  ongoing 03/21/24  MET 04/12/24  5.  Patient  will increase standing and walking tolerance to 10 mins  Baseline: 2-5 mins  Goal status: Ongoing 3 min 03/08/24, ongoing 03/22/24  and 04/12/24, 05/05/24 ongoing, 05/12/24 ongoing, doing better but not that long 06/09/24, 8 mins 07/19/24. Progressing 07/28/24   PLAN:  PT FREQUENCY: 2x/week  PT DURATION: 12 weeks  PLANNED INTERVENTIONS: 97110-Therapeutic exercises, 97530- Therapeutic activity, 97112- Neuromuscular re-education,  02464- Self Care, [id number]- Manual therapy, [id number]- Gait training, [phone]- Electrical stimulation (unattended), Patient/Family education, Balance training, Stair training, Dry Needling, Joint mobilization, Spinal mobilization, Cryotherapy, and Moist heat.  PLAN FOR NEXT SESSION: 2 more visits auth by 12/31 then D/C to work with HEP   Chrystina Naff,ANGIE, PTA 07/28/2024, 3:10 PM --------------------------------------------------------------------------------------------------------------------------------------------------------------

## 2024-08-02 ENCOUNTER — Ambulatory Visit: Admitting: Physical Therapy

## 2024-08-03 ENCOUNTER — Ambulatory Visit: Admitting: Physical Therapy

## 2024-08-03 ENCOUNTER — Encounter: Payer: Self-pay | Admitting: Physical Therapy

## 2024-08-03 DIAGNOSIS — M6281 Muscle weakness (generalized): Secondary | ICD-10-CM

## 2024-08-03 DIAGNOSIS — M25551 Pain in right hip: Secondary | ICD-10-CM

## 2024-08-03 DIAGNOSIS — R262 Difficulty in walking, not elsewhere classified: Secondary | ICD-10-CM

## 2024-08-03 DIAGNOSIS — M5459 Other low back pain: Secondary | ICD-10-CM

## 2024-08-03 NOTE — Therapy (Signed)
 " OUTPATIENT PHYSICAL THERAPY THORACOLUMBAR     Patient Name: Paula [name]  MRN: [id number] DOB:[birth year], 78 y.o., female Today's Date: 08/03/2024  END OF SESSION:  PT End of Session - 08/03/24 1156     Visit Number 21    Date for Recertification  09/12/24    PT Start Time 1156    PT Stop Time 1230    PT Time Calculation (min) 34 min    Activity Tolerance Patient tolerated treatment well    Behavior During Therapy [facility] for tasks assessed/performed                 Past Medical History:  Diagnosis Date   Allergic rhinitis    Allergy  1/12000   Arthritis    Asthma    Brain aneurysm    Cough    Diabetes mellitus    Diastolic CHF, chronic (HCC) 10/11/2011   ESRD on hemodialysis (HCC)    MWF   GERD (gastroesophageal reflux disease)    History of colon polyps 2012   tubular adenoma    Hyperlipidemia    Hypertension    Neuropathy 10/11/2011   PONV (postoperative nausea and vomiting)    one time after lymph node surgery   Past Surgical History:  Procedure Laterality Date   AV FISTULA PLACEMENT Right 10/15/2021   Procedure: ARTERIOVENOUS (AV) FISTULA  WITH PLACEMENT OF GORE-TEX STRETCH  GRAFT (4-79mmx45cm);  Surgeon: Eliza Lonni RAMAN, MD;  Location: [facility] OR;  Service: Vascular;  Laterality: Right;   CATARACT EXTRACTION     right eye   IR 3D INDEPENDENT WKST  12/14/2017   IR ANGIO INTRA EXTRACRAN SEL COM CAROTID INNOMINATE BILAT MOD SED  09/15/2017   IR ANGIO INTRA EXTRACRAN SEL COM CAROTID INNOMINATE UNI L MOD SED  12/14/2017   IR ANGIO VERTEBRAL SEL VERTEBRAL BILAT MOD SED  09/15/2017   IR RADIOLOGIST EVAL & MGMT  09/10/2017   IR RADIOLOGIST EVAL & MGMT  10/19/2017   lymphatic mass surgery     NASAL TURBINATE REDUCTION     RADIOLOGY WITH ANESTHESIA N/A 12/14/2017   Procedure: RADIOLOGY WITH ANESTHESIA EMBOLIZATION;  Surgeon: Dolphus Carrion, MD;  Location: [facility] OR;  Service: Radiology;  Laterality: N/A;   VIDEO BRONCHOSCOPY Bilateral 11/18/2017    Procedure: VIDEO BRONCHOSCOPY WITHOUT FLUORO;  Surgeon: Shelah Lamar RAMAN, MD;  Location: [facility] ENDOSCOPY;  Service: Cardiopulmonary;  Laterality: Bilateral;   Patient Active Problem List   Diagnosis Date Noted   Hypoxemia 12/29/2023   Abnormal electrocardiogram (ECG) (EKG) 12/29/2023   Left ventricular hypertrophy 12/29/2023   CHF (congestive heart failure) (HCC) 10/12/2023   Blurry vision 10/11/2023   Anemia due to chronic kidney disease 10/11/2023   Acute respiratory failure with hypoxia (HCC) 08/17/2023   PNA (pneumonia) 08/17/2023   Volume overload 08/17/2023   Acute hypoxic respiratory failure (HCC) 08/17/2023   SNHL (sensory-neural hearing loss), 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   ESRD on dialysis (HCC) 11/20/2022   Mold exposure 08/14/2022   Opioid withdrawal (HCC) 08/14/2022   ILD (interstitial lung disease) (HCC) 08/14/2022   Secondary hyperparathyroidism, renal 08/14/2022   Paroxysmal A-fib (HCC) 05/20/2022   Hypertrophy of nasal turbinates 09/05/2021   Chronic kidney disease, stage 5 (HCC)    Aortic atherosclerosis 06/24/2021   Chronic rhinitis 10/01/2017   Seasonal allergic conjunctivitis 10/01/2017   Aneurysm 09/24/2017   Acute metabolic encephalopathy 09/02/2017   Allergic rhinitis 08/20/2017   Palpitations 07/10/2016   Gout 01/23/2016   History of cataract 02/27/2015   Seasonal and perennial allergic rhinitis 02/15/2012  Chronic diastolic CHF (congestive heart failure) (HCC) 10/11/2011   GERD (gastroesophageal reflux disease)    Type 2 diabetes mellitus without complication, without long-term current use of insulin  (HCC) 07/03/2010   Hyperlipidemia 07/03/2010   Essential hypertension 07/03/2010    PCP: Corrina Sabin  REFERRING PROVIDER: Corrina Sabin  REFERRING DIAG: M54.50,G89.29 (ICD-10-CM) - Chronic right-sided low back pain without sciaticaM25.551 (ICD-10-CM) - Right hip pain  Rationale for Evaluation and Treatment: Rehabilitation  THERAPY DIAG:  Muscle  weakness (generalized)  Difficulty in walking, not elsewhere classified  Other low back pain  Pain in right hip  ONSET DATE: 10/11/23  SUBJECTIVE:                                                                                                                                                                                           SUBJECTIVE STATEMENT: 6 min late. The oxygen makes me tired Pt stated she has oxygen at home, and when she on dialysis. 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   She experiences pain in the lower back, specifically in the right hip and lower back, which makes standing for more than two minutes difficult. The pain is localized to the hip area without radiation down the leg. She is receiving physical therapy at home and is interested in outpatient therapy. Tylenol  and a heating pad provide pain relief. Hip x-ray from 2024 revealed degenerative changes send pubic symphysis, both hips, SI joints and lower lumbar spine.  She fell from her bed last Monday, resulting in pain on the left side of her head and right shoulder, which was swollen but has improved. No current swelling or pain with pressure. She experiences dizziness at times and uses a cane occasionally.    PAIN:  Are you having pain? Yes: NPRS scale: 0/10 low back pain Pain location: R hip and midline low back  Pain description: not there all the time, if I am sitting I am  okay Aggravating factors: when I get up, when I am standing, walking  Relieving factors: Tylenol  and gabapentin ,   PRECAUTIONS: Fall  RED FLAGS: None   WEIGHT BEARING RESTRICTIONS: No  FALLS:  Has patient fallen in last 6 months? Yes. Number of falls 2 knee gave out in March, then 1 time fell out of the bed   LIVING ENVIRONMENT: Lives with: lives with their family Lives in: House/apartment Stairs: Yes: Internal: 14 steps; on right going up and the wall on the other side Has following equipment at home: Single point cane and Walker - 2 wheeled  OCCUPATION: N/A  PLOF: Independent with basic ADLs  PATIENT GOALS: to get rid of the pain, be able to walk and stand for longer than 2 mins without the pain  NEXT MD VISIT: 03/01/24  OBJECTIVE:  Note: Objective measures were completed at Evaluation unless otherwise noted.  DIAGNOSTIC FINDINGS:  X ray ordered for hips, lumbar, and knees   COGNITION: Overall cognitive status: Within functional limits for tasks assessed     SENSATION: WFL  POSTURE: rounded shoulders, forward head, and increased thoracic kyphosis  PALPATION: TTP and pain lumbar spine   LUMBAR ROM:   AROM eval 01/14/24 01/21/24 03/08/24 03/21/24  Flexion Distal shin with pain [facility]   WFL  Extension 25% available Limited 25% Limited 25% [facility] WFL  Right lateral flexion Mid thigh with pain Limited 25% Limited 25% 25% limited  WFL  Left lateral flexion Mid thigh with pain WFL   WFL  Right rotation 75% available with pain [facility]   WFL  Left rotation 75% available with pain WFL   WFL   (Blank rows = not tested)  LOWER EXTREMITY ROM:   grossly WFL   LOWER EXTREMITY MMT:  grossly 4-/5 BLE    FUNCTIONAL TESTS:  5 times sit to stand: 43s from elevated mat and needing UE push off, loses balance on to heels  Timed up and go (TUG): 24s   GAIT: Distance walked: in clinic distances Assistive device utilized: None Level of assistance: CGA Comments: furniture walking, or  reaches out to grab daughter's hand to walk  TREATMENT DATE:  08/03/24 O2 SATs 92% NuStep L 5 x 6 min STS with yellow wt ball chest press 2x10  6in step ups x 10 each Passive stretching to bilateral LE Piriformis, HS, ITB, K2C, lower trunk rotation   07/28/24 Nustep L 5 5 min HS curls 25# 2x10 Leg ext 10# 2x10 STS with yellow wt ball chest press 10 x Supine- bridge with ADD ball squeeze, green tband clams with bridge, SL clams, alt marching green tband 10 x each  Passive stretching to bilateral LE Piriformis, HS, ITB, K2C, lower trunk rotation   07/19/24 Recheck goals  NuStep L5x25mins HS curls 25# 2x10 Leg ext 10# 2x10 Passive stretching to bilateral LE Piriformis, HS, ITB, K2C, lower trunk rotation Supine clamshells x10 each side Feet on ball rotations, knees to chest   07/04/24: Nustep L 5 x 4.30 min stopped due to SOB, coughing, pulse ox 79 Ran pt's portable fan, and purse lip breathing , increased to 91 Seated B hamstring curls 25#, 3 x 10 B knee ext 5 # , 2 x 10 Seated hip abd/ER with black t band 2 x 10 Supine for manual stretching by therapist, hamstrings, piriformis Physioball under thighs, for B knee to chest, LTR, bridging Rechecked O2 sats 91% after lying down  Improved up to 94 after seated pursed breathing.   06/09/24 NuStep L5x66mins Walking laps goal is 593ft Leg ext 10# 2x10 HS curls 25# 2x10 STS chest press with red ball 2x10 Passive stretching to bilateral LE Piriformis, HS, ITB, K2C, lower trunk rotation  05/19/24 SPO2 95% NuStep L5x52mins Leg ext 5# 2x12 HS curls 20# 2x12 Step ups 4  STS chest press with red ball 2x10 Passive stretching to bilateral LE Piriformis, HS, ITB, K2C, Double K2C, lower trunk rotation  05/12/24 NuStep L5 x 6 min HS cutls 20lb 2x10 Leg Ext 5lb 2x10 Sit to stand holding yellow ball 2x10 Seated rows green 2x10 Shoulder Ext green 2x10 Passive stretching to bilateral LE  Piriformis, HS, ITB, K2C  Double K2C,  lower trunk rotation   05/10/24 Focus session on est new HEP she can do in bed to hoepfully increase compliance. Daughter present for carry over Access Code: [facility] URL: [email or web address] Date: 05/10/2024 Prepared by: Angela Payseur  Exercises - Supine Posterior Pelvic Tilt  - 1 x daily - 7 x weekly - 3 sets - 10 reps - Supine Bridge  - 2 x daily - 7 x weekly - 1 sets - 10 reps - Supine Lower Trunk Rotation  - 2 x daily - 7 x weekly - 1 sets - 10 reps - Supine Hamstring Stretch with Strap  - 2 x daily - 7 x weekly - 2 sets - 5 reps - 10 hold - Supine March  - 2 x daily - 7 x weekly - 2 sets - 10 reps - Hooklying Abduction with Resistance  - 2 x daily - 7 x weekly - 1 sets - 10 reps  PROM BIL LE and trunk Nustep L 5   05/05/24 BP 123/59 HR 80 Bike L 3 x 6 min Goals Sit to stands 2x10 LE on thighs  LAQ 3lb 2x10 HS curls green 2x10 Passive stretching to bilateral HS, piriformis, Glute, ITB  04/14/24 Nustep L 6 Resisted gait 30# 4 ways 4 x each 10# cable row 10 x 5# cable shld ext 10x 5# cable hip 4 way 10 x- pain and weakness noted RT LB/glut with ex. Left side weakness with ext and abd but no pain Passive stretching to bilateral HS, piriformis, Glute, ITB   04/12/24 Nustep L 6 7 min STS 5 x 14.2 sec, assessed goals Knee ext 5# 2 sets 10 HS curl 20# 2 sets 10 STS with wt ball press 10x then 10 x OH Cable pulley shld ext 5# 2 sets 10 Resisted trunk ext 20 x Leg Press 30# 2 sets 10 Passive stretching to bilateral HS, piriformis, Glute, ITB    03/22/24 Bike L 3x 5 min Goals  5x sit tot stand 22.23 sec Rows & Ext green 2x10 Passive stretching to bilateral HS, piriformis, Glute, ITB Gait 125ft ~ 52.41 seconds to complete  03/08/24 NuStep L 5 x 6 min  HS curls 20lb 2x10 Leg Ext 5lb 2x10 Rows & Ext green 2x10 Passive stretching to bilateral HS, piriformis, Glute, ITB   02/11/24 NuStep L5x42mins Shoulder ext 5# 2x10 Step ups 4  Feet on  ball rotations and knees to chest x10 Bridges 2x10 Passive stretching HS, glutes, SKTC, LTR STS 2x10   PATIENT EDUCATION:  Education details: POC, HEP, importance of movement Person educated: Patient and Child(ren) Education method: Explanation and Demonstration Education comprehension: verbalized understanding  HOME EXERCISE PROGRAM: Access Code: [email or web address] URL: [email or web address] Date: 01/12/2024 Prepared by: Tanda Sorrow  Exercises - Supine Lower Trunk Rotation  - 1 x daily - 7 x weekly - 2 sets - 10 reps - Seated March  - 1 x daily - 7 x weekly - 2 sets - 10 reps - Seated Hip Abduction with Resistance  - 1 x  daily - 7 x weekly - 2 sets - 10 reps - Sit to Stand  - 1 x daily - 7 x weekly - 2 sets - 5 reps - Supine Bridge  - 1 x daily - 7 x weekly - 3 sets - 10 reps - Standing Hip Abduction with Counter Support  - 1 x daily - 7 x weekly - 3 sets - 10 reps  ASSESSMENT:  CLINICAL IMPRESSION: Pt arrived 5 min late. Pt tolerated session fair with some SOB and coughing throughout. Cuing needed with ex. CGA required for stability with step ups. Sent HEP to daughters phone for HEP compliance. Pt has one mote visit.  Patient is a 78 y.o. female who was seen today for physical therapy evaluation and treatment for balance, gait, low back and hip pain. She is ambulating without a device but is very unsteady and shuffles her feet. She was advised to use the [facility] at the least. Patient has a chronic cough that is pretty bad. Her daughter reports she is not complaint with HEP and spends most of the day sitting on the sofa. She presents with LE weakness and balance deficits that put her at a high fall risk. She states that her back and hip pain prevent her from doing anything. Patient is very wobbly when going from sit to stand especially after prolonged sitting. She will benefit from skilled PT to address her gait and balance impairments and decrease her hip and back pain to be more  mobile.   OBJECTIVE IMPAIRMENTS: Abnormal gait, cardiopulmonary status limiting activity, decreased activity tolerance, decreased balance, decreased coordination, decreased endurance, decreased mobility, difficulty walking, decreased ROM, decreased strength, postural dysfunction, and pain.   ACTIVITY LIMITATIONS: lifting, bending, sitting, standing, squatting, transfers, and locomotion level  PARTICIPATION LIMITATIONS: meal prep, cleaning, laundry, shopping, and community activity  PERSONAL FACTORS: Age, Behavior pattern, Past/current experiences, Time since onset of injury/illness/exacerbation, and 3+ comorbidities: CHF, ESRD on dialysis, neuropathy, chronic cough are also affecting patient's functional outcome.   REHAB POTENTIAL: Fair    CLINICAL DECISION MAKING: Stable/uncomplicated  EVALUATION COMPLEXITY: Low   GOALS: Goals reviewed with patient? Yes  SHORT TERM GOALS: Target date: 02/04/24  Patient will be independent with initial HEP. Baseline: given 12/24/23 Goal status: 01/07/24 MET  2.  Patient will demonstrate decreased fall risk by scoring < 15 sec on TUG. Baseline: 24s Goal status: 01/07/24 MET   LONG TERM GOALS: Target date: 09/12/24  Patient will be independent with advanced/ongoing HEP to improve outcomes and carryover.  Baseline:  Goal status: ongoing 03/22/24  04/12/24 MET  2.  Patient will be able to ambulate 500' with good safety to access community.  Baseline: not using AD, very unsteady and unable to walk or stand more than 2 mins Goal status: Progressing slow gait 03/08/24, ongoing 03/22/24  progressing 04/12/24, Ongoing 05/05/24, MET 06/09/24 REVISED GOAL: 874ft without SOB and fatigue -625ft progressing 07/19/24 and 07/28/24  3.  Patient will report 50-75% improvement in R hip and low back pain Baseline: 10/10  Goal status: Progressing 7/10, Met 03/22/24  MET 04/12/24, back has been hurting 05/05/24, back has not hurt, it is better 05/19/24 MET  4.  Patient will  demonstrate improved functional LE strength as demonstrated by 5xSTS <20s Baseline: 43s elevated mat and UE push off Goal status: Progressing 01/14/24, Progressing 01/21/24,  ongoing 03/21/24  MET 04/12/24  5.  Patient will increase standing and walking tolerance to 10 mins  Baseline: 2-5 mins  Goal status: Ongoing  3 min 03/08/24, ongoing 03/22/24  and 04/12/24, 05/05/24 ongoing, 05/12/24 ongoing, doing better but not that long 06/09/24, 8 mins 07/19/24. Progressing 07/28/24   PLAN:  PT FREQUENCY: 2x/week  PT DURATION: 12 weeks  PLANNED INTERVENTIONS: 97110-Therapeutic exercises, 97530- Therapeutic activity, 97112- Neuromuscular re-education, 97535- Self Care, [id number]- Manual therapy, [phone]- Gait training, [phone]- Electrical stimulation (unattended), Patient/Family education, Balance training, Stair training, Dry Needling, Joint mobilization, Spinal mobilization, Cryotherapy, and Moist heat.  PLAN FOR NEXT SESSION: 2 more visits auth by 12/31 then D/C to work with HEP   Tanda KANDICE Sorrow, PTA 08/03/2024, 11:56 AM -------------------------------------------------------------------------------------------------------------------------------------------------------------- "

## 2024-08-05 ENCOUNTER — Other Ambulatory Visit: Payer: Self-pay

## 2024-08-05 ENCOUNTER — Other Ambulatory Visit (HOSPITAL_COMMUNITY): Payer: Self-pay

## 2024-08-05 ENCOUNTER — Other Ambulatory Visit (HOSPITAL_BASED_OUTPATIENT_CLINIC_OR_DEPARTMENT_OTHER): Payer: Self-pay

## 2024-08-05 ENCOUNTER — Other Ambulatory Visit: Payer: Self-pay | Admitting: Family Medicine

## 2024-08-05 MED ORDER — ALBUTEROL SULFATE (2.5 MG/3ML) 0.083% IN NEBU
2.5000 mg | INHALATION_SOLUTION | Freq: Four times a day (QID) | RESPIRATORY_TRACT | 1 refills | Status: AC | PRN
  Filled 2024-08-05: qty 75, 7d supply, fill #0

## 2024-08-08 ENCOUNTER — Other Ambulatory Visit (HOSPITAL_BASED_OUTPATIENT_CLINIC_OR_DEPARTMENT_OTHER): Payer: Self-pay

## 2024-08-10 ENCOUNTER — Other Ambulatory Visit: Payer: Self-pay

## 2024-08-17 ENCOUNTER — Encounter (HOSPITAL_COMMUNITY): Payer: Self-pay

## 2024-08-17 ENCOUNTER — Emergency Department (HOSPITAL_COMMUNITY)

## 2024-08-17 ENCOUNTER — Other Ambulatory Visit: Payer: Self-pay

## 2024-08-17 ENCOUNTER — Emergency Department (HOSPITAL_COMMUNITY)
Admission: EM | Admit: 2024-08-17 | Discharge: 2024-08-17 | Disposition: A | Discharge status: Home / Self Care | Attending: Emergency Medicine | Admitting: Emergency Medicine

## 2024-08-17 ENCOUNTER — Ambulatory Visit: Admitting: Cardiology

## 2024-08-17 DIAGNOSIS — R42 Dizziness and giddiness: Secondary | ICD-10-CM | POA: Diagnosis not present

## 2024-08-17 DIAGNOSIS — R0789 Other chest pain: Secondary | ICD-10-CM | POA: Insufficient documentation

## 2024-08-17 DIAGNOSIS — Z7951 Long term (current) use of inhaled steroids: Secondary | ICD-10-CM | POA: Insufficient documentation

## 2024-08-17 DIAGNOSIS — N186 End stage renal disease: Secondary | ICD-10-CM | POA: Diagnosis not present

## 2024-08-17 DIAGNOSIS — Z7901 Long term (current) use of anticoagulants: Secondary | ICD-10-CM | POA: Insufficient documentation

## 2024-08-17 DIAGNOSIS — Z79899 Other long term (current) drug therapy: Secondary | ICD-10-CM | POA: Diagnosis not present

## 2024-08-17 DIAGNOSIS — Z992 Dependence on renal dialysis: Secondary | ICD-10-CM | POA: Diagnosis not present

## 2024-08-17 DIAGNOSIS — Z7984 Long term (current) use of oral hypoglycemic drugs: Secondary | ICD-10-CM | POA: Diagnosis not present

## 2024-08-17 DIAGNOSIS — E1122 Type 2 diabetes mellitus with diabetic chronic kidney disease: Secondary | ICD-10-CM | POA: Insufficient documentation

## 2024-08-17 DIAGNOSIS — I132 Hypertensive heart and chronic kidney disease with heart failure and with stage 5 chronic kidney disease, or end stage renal disease: Secondary | ICD-10-CM | POA: Insufficient documentation

## 2024-08-17 DIAGNOSIS — I5030 Unspecified diastolic (congestive) heart failure: Secondary | ICD-10-CM | POA: Insufficient documentation

## 2024-08-17 DIAGNOSIS — I4891 Unspecified atrial fibrillation: Secondary | ICD-10-CM | POA: Insufficient documentation

## 2024-08-17 DIAGNOSIS — J45909 Unspecified asthma, uncomplicated: Secondary | ICD-10-CM | POA: Diagnosis not present

## 2024-08-17 DIAGNOSIS — R053 Chronic cough: Secondary | ICD-10-CM | POA: Insufficient documentation

## 2024-08-17 DIAGNOSIS — E1140 Type 2 diabetes mellitus with diabetic neuropathy, unspecified: Secondary | ICD-10-CM | POA: Insufficient documentation

## 2024-08-17 LAB — CBC WITH DIFFERENTIAL/PLATELET
Abs Immature Granulocytes: 0.03 K/uL (ref 0.00–0.07)
Basophils Absolute: 0 K/uL (ref 0.0–0.1)
Basophils Relative: 0 %
Eosinophils Absolute: 0.2 K/uL (ref 0.0–0.5)
Eosinophils Relative: 2 %
HCT: 26 % — ABNORMAL LOW (ref 36.0–46.0)
Hemoglobin: 8.9 g/dL — ABNORMAL LOW (ref 12.0–15.0)
Immature Granulocytes: 0 %
Lymphocytes Relative: 10 %
Lymphs Abs: 0.8 K/uL (ref 0.7–4.0)
MCH: 31.1 pg (ref 26.0–34.0)
MCHC: 34.2 g/dL (ref 30.0–36.0)
MCV: 90.9 fL (ref 80.0–100.0)
Monocytes Absolute: 0.7 K/uL (ref 0.1–1.0)
Monocytes Relative: 8 %
Neutro Abs: 6.5 K/uL (ref 1.7–7.7)
Neutrophils Relative %: 80 %
Platelets: 134 K/uL — ABNORMAL LOW (ref 150–400)
RBC: 2.86 MIL/uL — ABNORMAL LOW (ref 3.87–5.11)
RDW: 15.9 % — ABNORMAL HIGH (ref 11.5–15.5)
WBC: 8.2 K/uL (ref 4.0–10.5)
nRBC: 0 % (ref 0.0–0.2)

## 2024-08-17 LAB — COMPREHENSIVE METABOLIC PANEL WITH GFR
ALT: 24 U/L (ref 0–44)
AST: 33 U/L (ref 15–41)
Albumin: 4.3 g/dL (ref 3.5–5.0)
Alkaline Phosphatase: 137 U/L — ABNORMAL HIGH (ref 38–126)
Anion gap: 12 (ref 5–15)
BUN: 20 mg/dL (ref 8–23)
CO2: 29 mmol/L (ref 22–32)
Calcium: 8.4 mg/dL — ABNORMAL LOW (ref 8.9–10.3)
Chloride: 95 mmol/L — ABNORMAL LOW (ref 98–111)
Creatinine, Ser: 3.73 mg/dL — ABNORMAL HIGH (ref 0.44–1.00)
GFR, Estimated: 12 mL/min — ABNORMAL LOW
Glucose, Bld: 164 mg/dL — ABNORMAL HIGH (ref 70–99)
Potassium: 3 mmol/L — ABNORMAL LOW (ref 3.5–5.1)
Sodium: 136 mmol/L (ref 135–145)
Total Bilirubin: 0.9 mg/dL (ref 0.0–1.2)
Total Protein: 7.3 g/dL (ref 6.5–8.1)

## 2024-08-17 LAB — CBG MONITORING, ED: Glucose-Capillary: 209 mg/dL — ABNORMAL HIGH (ref 70–99)

## 2024-08-17 LAB — RESP PANEL BY RT-PCR (RSV, FLU A&B, COVID)  RVPGX2
Influenza A by PCR: NEGATIVE
Influenza B by PCR: NEGATIVE
Resp Syncytial Virus by PCR: NEGATIVE
SARS Coronavirus 2 by RT PCR: NEGATIVE

## 2024-08-17 LAB — TROPONIN T, HIGH SENSITIVITY
Troponin T High Sensitivity: 247 ng/L (ref 0–19)
Troponin T High Sensitivity: 242 ng/L (ref 0–19)

## 2024-08-17 LAB — PRO BRAIN NATRIURETIC PEPTIDE: Pro Brain Natriuretic Peptide: 26241 pg/mL — ABNORMAL HIGH

## 2024-08-17 NOTE — ED Triage Notes (Signed)
 Patient arrives via [facility] for weakness and generalized malaise from dialysis. Generalized malaise, left hand tingling x3 days, and weakness. Lungs clear. O2 93 on 2L, 2L [place] prn. Patient received full dialysis treatment.  Afib on monitor HR 90 hx of same 146/86 CBG 148

## 2024-08-17 NOTE — ED Provider Triage Note (Signed)
 Emergency Medicine Provider Triage Evaluation Note  Paula [name]  , a 79 y.o. female  was evaluated in triage.  Pt complains of weakness and general malaise from dialysis.  Review of Systems  Positive: Weakness and general malaise Negative: Fever, chills, nausea, vomiting  Physical Exam  LMP  (LMP Unknown)  Gen:   Awake, no distress   Resp:  Normal effort  MSK:   Moves extremities without difficulty  Other:    Medical Decision Making  Medically screening exam initiated at 3:42 PM.  Appropriate orders placed.  Marelly A Jungbluth  was informed that the remainder of the evaluation will be completed by another provider, this initial triage assessment does not replace that evaluation, and the importance of remaining in the ED until their evaluation is complete.  Labs ordered   Francis Ileana SAILOR, PA-C 08/17/24 [id number]

## 2024-08-17 NOTE — ED Notes (Signed)
 Pt provided with discharge and follow up instructions, medications discussed, pt verbalized understanding. LDA removed, VSS, pt wheeled via WC out of ED w/ all paperwork and belongings in NAD w/ daughter

## 2024-08-17 NOTE — ED Provider Notes (Signed)
 " [facility] Provider Note   CSN: [id number] Arrival date & time: 08/17/24  1521     Patient presents with: Weakness   Paula [name]  is a 79 y.o. female who presents emergency department with a chief complaint of chest discomfort.  History is given by the patient and her daughter at bedside.  She is a past history of end-stage renal disease on hemodialysis last full hemodialysis today.  Patient reports that Monday she vomited after her dialysis session.  Yesterday she began having chest tightness on the left side radiating to her shoulder with tingling in her fingers.  This came and went.  Today she began having tingling in her fingers again.  Her family member states that she has a chronic cough but at home they were seeing that her oxygen saturations were slightly lower than normal depending on which finger they put it on and her position.  She is currently on 2 L I am unsure of her baseline but at home she does not use oxygen.  She is not complaining of any shortness of breath.  She states that earlier today during dialysis she felt like she was going to pass out she felt extremely lightheaded.  She vomited today during dialysis and came here afterward for further evaluation.  She reports that all of her symptoms are resolved except for her chronic cough at this time.    Weakness      Prior to Admission medications  Medication Sig Start Date End Date Taking? Authorizing Provider  ACCU-CHEK GUIDE TEST test strip USE 1 STRIP TO CHECK GLUCOSE TWICE DAILY 04/08/24   Newlin, Enobong, MD  acetaminophen  (TYLENOL ) 500 MG tablet Take 500 mg by mouth every 6 (six) hours as needed for fever, moderate pain or mild pain.    [provider]  albuterol  (PROAIR  HFA) 108 (90 Base) MCG/ACT inhaler Inhale 1-2 puffs into the lungs every 6 (six) hours as needed for wheezing or shortness of breath. 10/29/23   Danton Jon HERO, PA-C  albuterol  (PROVENTIL )  (2.5 MG/3ML) 0.083% nebulizer solution Take 3 mLs (2.5 mg total) by nebulization every 6 (six) hours as needed for wheezing or shortness of breath. 08/05/24   Newlin, Enobong, MD  apixaban  (ELIQUIS ) 5 MG TABS tablet Take 1 tablet (5 mg total) by mouth 2 (two) times daily. 05/16/24   Newlin, Enobong, MD  calcium  acetate (PHOSLO ) 667 MG capsule Take 1 capsule (667 mg total) by mouth 3 (three) times daily with meals. 07/17/23     carvedilol  (COREG ) 3.125 MG tablet Take 1 tablet (3.125 mg total) by mouth 2 (two) times daily. 04/05/24 10/02/24  Sheron Lorette GRADE, PA-C  fluticasone -salmeterol (ADVAIR ) 100-50 MCG/ACT AEPB Inhale 1 puff into the lungs 2 (two) times daily. 04/26/24 07/25/24  Newlin, Enobong, MD  furosemide  (LASIX ) 20 MG tablet Take 3 tablets (60 mg total) by mouth daily. 06/30/24   Floretta Mallard, MD  gabapentin  (NEURONTIN ) 100 MG capsule Take 1 capsule (100 mg total) by mouth 3 (three) times daily. 06/30/24   Newlin, Enobong, MD  glipiZIDE  (GLUCOTROL  XL) 2.5 MG 24 hr tablet Take 1 tablet (2.5 mg total) by mouth daily with breakfast. 05/03/24   Thapa, Sudan, MD  Homeopathic Products (EARACHE DROPS OT) Place 1 drop into both ears daily as needed (for ear aches).    [provider]  ipratropium (ATROVENT ) 0.03 % nasal spray Place 2 sprays into both nostrils every 12 (twelve) hours. 06/02/24   Dharap,  Anuja R, DO  isosorbide  mononitrate (IMDUR ) 30 MG 24 hr tablet Take 1 tablet (30 mg total) by mouth at bedtime. Do not take on dialysis days. 07/01/24   Lavona Agent, MD  lidocaine -prilocaine  (EMLA ) cream Apply a small amount to dialysis access one hour prior to dialysis. Wrap with plastic wrap. 12/10/21     linagliptin  (TRADJENTA ) 5 MG TABS tablet Take 1 tablet (5 mg total) by mouth daily. 05/03/24   Thapa, Sudan, MD  midodrine  (PROAMATINE ) 10 MG tablet Take 1 tablet (10 mg total) by mouth 3 (three) times a week. Take 30 minutes prior to HD 05/18/24     Misc. Devices [employer]  Diagnosis -  unstable gait 03/05/21   Delbert Clam, MD  Misc. Devices MISC Fistula sleeve for R arm.  Diagnoses: End-stage renal disease on hemodialysis 01/09/22   Delbert Clam, MD  Misc. Devices MISC 1. Incontinence supplies.  2. Briefs.  Diagnosis-urinary incontinence 04/17/22   Delbert Clam, MD  Misc. Devices MISC Nepro dialysis protein drink.  Diagnosis end-stage renal disease Patient taking differently: Take 10 g by mouth See admin instructions. Nepro dialysis protein drink (Diagnosis end-stage renal disease)- Mix 10 grams of powder and mix into food daily as needed for low protein 05/16/22   Delbert Clam, MD  Misc. Devices MISC Blood pressure monitor.  Diagnosis hypertension 05/20/22   Delbert Clam, MD  Misc. Devices MISC Portable oxygen concentrator, 2 L of oxygen.  Diagnosis chronic respiratory failure. 12/31/23   Newlin, Enobong, MD  olopatadine  (PATANOL) 0.1 % ophthalmic solution Place 1 drop into both eyes 2 (two) times daily. 08/14/22   Brien Belvie BRAVO, MD  ondansetron  (ZOFRAN ) 4 MG tablet Take 4 mg by mouth every 8 (eight) hours as needed for nausea or vomiting.    [provider]  Throat Lozenges (HALLS COUGH DROPS MT) Use as directed 1 lozenge in the mouth or throat every 2 (two) hours as needed (for coughing).    [provider]  tiZANidine  (ZANAFLEX ) 4 MG tablet Take 1 tablet (4 mg total) by mouth every 8 (eight) hours as needed for muscle spasms. 04/07/24   Newlin, Enobong, MD    Allergies: Dust mite extract, Ace inhibitors, Insulins, and Lovastatin    Review of Systems  Neurological:  Positive for weakness.    Updated Vital Signs BP 130/62 (BP Location: Right Arm)   Pulse 86   Temp 99.4 F (37.4 C) (Oral)   Resp 16   Ht 5' 2 (1.575 m)   Wt 75 kg   LMP  (LMP Unknown)   SpO2 100%   BMI 30.24 kg/m   Physical Exam Vitals and nursing note reviewed.  Constitutional:      General: She is not in acute distress.    Appearance: She is well-developed. She  is not diaphoretic.  HENT:     Head: Normocephalic and atraumatic.     Right Ear: External ear normal.     Left Ear: External ear normal.     Nose: Nose normal.     Mouth/Throat:     Mouth: Mucous membranes are moist.  Eyes:     General: No scleral icterus.    Extraocular Movements: Extraocular movements intact.     Conjunctiva/sclera: Conjunctivae normal.     Pupils: Pupils are equal, round, and reactive to light.  Cardiovascular:     Rate and Rhythm: Normal rate and regular rhythm.     Heart sounds: Normal heart sounds. No murmur heard.    No  friction rub. No gallop.  Pulmonary:     Effort: Pulmonary effort is normal. No respiratory distress.     Breath sounds: Normal breath sounds.  Abdominal:     General: Bowel sounds are normal. There is no distension.     Palpations: Abdomen is soft. There is no mass.     Tenderness: There is no abdominal tenderness. There is no guarding.  Musculoskeletal:     Cervical back: Normal range of motion.  Skin:    General: Skin is warm and dry.  Neurological:     Mental Status: She is alert and oriented to person, place, and time.  Psychiatric:        Behavior: Behavior normal.     (all labs ordered are listed, but only abnormal results are displayed) Labs Reviewed  COMPREHENSIVE METABOLIC PANEL WITH GFR - Abnormal; Notable for the following components:      Result Value   Potassium 3.0 (*)    Chloride 95 (*)    Glucose, Bld 164 (*)    Creatinine, Ser 3.73 (*)    Calcium  8.4 (*)    Alkaline Phosphatase 137 (*)    GFR, Estimated 12 (*)    All other components within normal limits  CBC WITH DIFFERENTIAL/PLATELET - Abnormal; Notable for the following components:   RBC 2.86 (*)    Hemoglobin 8.9 (*)    HCT 26.0 (*)    RDW 15.9 (*)    Platelets 134 (*)    All other components within normal limits  RESP PANEL BY RT-PCR (RSV, FLU A&B, COVID)  [id number]  URINALYSIS, ROUTINE W REFLEX MICROSCOPIC    EKG: None  Radiology: No results  found.   Procedures   Medications Ordered in the ED - No data to display  Clinical Course as of 08/17/24 2329  Wed Aug 17, 2024  2013 Case discussed with Dr. Gail.  We discussed her previous troponin I's being elevated.  He states there is really no one-to-one correlation with troponin T and it may be due to volume overload due to her history of ESRD plus troponin T is more affected by renal clearance we will need a second troponin T if it is fairly flat she can likely go home however if she has a significant increase then we need to consider cardiac event and admission. [AH]    Clinical Course User Index [AH] Arloa Chroman, PA-C                                 Medical Decision Making Amount and/or Complexity of Data Reviewed Radiology: ordered.   This patient presents to the ED for concern of chest pressure, this involves an extensive number of treatment options, and is a complaint that carries with it a high risk of complications and morbidity.  The emergent differential diagnosis of chest pain includes: Acute coronary syndrome, pericarditis, aortic dissection, pulmonary embolism, tension pneumothorax, pneumonia, and esophageal rupture.   Co morbidities:   has a past medical history of Allergic rhinitis, Allergy  (1/12000), Arthritis, Asthma, Brain aneurysm, Cough, Diabetes mellitus, Diastolic CHF, chronic (HCC) (10/11/2011), ESRD on hemodialysis (HCC), GERD (gastroesophageal reflux disease), History of colon polyps (2012), Hyperlipidemia, Hypertension, Neuropathy (10/11/2011), and PONV (postoperative nausea and vomiting).   Social Determinants of Health:   SDOH Screenings   Food Insecurity: Food Insecurity Present (03/01/2024)  Housing: High Risk (03/01/2024)  Transportation Needs: Unmet Transportation Needs (03/01/2024)  Utilities: At Risk (10/11/2023)  Alcohol  Screen: Low Risk (03/01/2024)  Depression (PHQ2-9): Low Risk (12/15/2023)  Financial Resource Strain: High Risk  (03/01/2024)  Physical Activity: Insufficiently Active (03/01/2024)  Social Connections: Unknown (03/01/2024)  Stress: No Stress Concern Present (03/01/2024)  Tobacco Use: Low Risk (08/03/2024)  Health Literacy: Adequate Health Literacy (09/15/2023)     Additional history:  {Additional history obtained from daughter at bedside   Lab Tests:  I Ordered, and personally interpreted labs.  The pertinent results include:   Troponin T elevated but flat at 240 BNP significantly elevated.  Imaging Studies:  I ordered imaging studies including chest x-ray I independently visualized and interpreted imaging which showed no acute finding I agree with the radiologist interpretation  Cardiac Monitoring/ECG:  The patient was maintained on a cardiac monitor.  I personally viewed and interpreted the cardiac monitored which showed an underlying rhythm of: Rate controlled atrial fibrillation chronic     Test Considered:    Critical Interventions:    Consultations Obtained:   Problem List / ED Course:     ICD-10-CM   1. Chest discomfort  R07.89     2. Episodic lightheadedness  R42       MDM: Patient here with chest discomfort.  She has end-stage renal disease.  She is in rate controlled atrial fibrillation, history of CHF.  Patient's daughter states that she has oxygen at home which she uses as needed.  She is notably hypoxic care today but not markedly short of breath.  Given her A-fib, end-stage renal disease and her current want to be discharged.  I think that this is safe after 2 flat troponins in the same range.  Patient does not appear to be significantly clinically volume overloaded and is not having any severe shortness of breath.  Given her request to be discharged her oxygen supplementation at home I think that is fine.  She is advised to return if she has any worsening in her breathing condition.  She is scheduled to follow-up for dialysis today after tomorrow however she may  return sooner if she is feeling more short of breath.  She does not appear to be having any evidence of ACS.  She is compliant with her Eliquis  use.   Dispostion:  After consideration of the diagnostic results and the patients response to treatment, I feel that the patent would benefit from discharge with strict return precautions.      Final diagnoses:  None    ED Discharge Orders     None          Arloa Chroman, PA-C 08/17/24 2336    Freddi Hamilton, MD 08/20/24 0740  "

## 2024-08-17 NOTE — ED Notes (Addendum)
 Pt sat up in bed, [place] removed per provider verbal order for O2 sat reading. O2 sat on RA with good pleth 83-84%. Pt placed back on 2L via [place], o2 sat improved to 98%. RN attempt to insert PIV and obtain labs, unsuccessful. Second RN to try

## 2024-08-17 NOTE — ED Notes (Signed)
 Pt notified of need for urine sample, states she is dialysis pt and only urinates in the mornings

## 2024-08-17 NOTE — Discharge Instructions (Signed)
 Your troponin was elevated  which is likely due to your kidney disease. You do not appear to be having a heart attack Please follow up with your primary care doctor and continue to get your dialysis regularly. Where your oxygen at all times  Your caregiver has diagnosed you as having chest pain that is not specific for one problem, but does not require admission.  You are at low risk for an acute heart condition or other serious illness. Chest pain comes from many different causes.  SEEK IMMEDIATE MEDICAL ATTENTION IF: You have severe chest pain, especially if the pain is crushing or pressure-like and spreads to the arms, back, neck, or jaw, or if you have sweating, nausea (feeling sick to your stomach), or shortness of breath. THIS IS AN EMERGENCY. Don't wait to see if the pain will go away. Get medical help at once. Call 911 or 0 (operator). DO NOT drive yourself to the hospital.  Your chest pain gets worse and does not go away with rest.  You have an attack of chest pain lasting longer than usual, despite rest and treatment with the medications your caregiver has prescribed.  You wake from sleep with chest pain or shortness of breath.  You feel dizzy or faint.  You have chest pain not typical of your usual pain for which you originally saw your caregiver.

## 2024-08-17 NOTE — ED Notes (Signed)
Provider notified of troponin level.

## 2024-08-18 ENCOUNTER — Ambulatory Visit: Admitting: Cardiology

## 2024-08-23 ENCOUNTER — Other Ambulatory Visit: Payer: Self-pay

## 2024-08-23 DIAGNOSIS — I2510 Atherosclerotic heart disease of native coronary artery without angina pectoris: Secondary | ICD-10-CM | POA: Insufficient documentation

## 2024-08-23 DIAGNOSIS — M1990 Unspecified osteoarthritis, unspecified site: Secondary | ICD-10-CM | POA: Insufficient documentation

## 2024-08-23 DIAGNOSIS — J45909 Unspecified asthma, uncomplicated: Secondary | ICD-10-CM | POA: Insufficient documentation

## 2024-08-23 DIAGNOSIS — R112 Nausea with vomiting, unspecified: Secondary | ICD-10-CM | POA: Insufficient documentation

## 2024-08-23 DIAGNOSIS — E1165 Type 2 diabetes mellitus with hyperglycemia: Secondary | ICD-10-CM | POA: Insufficient documentation

## 2024-08-23 DIAGNOSIS — T7840XA Allergy, unspecified, initial encounter: Secondary | ICD-10-CM | POA: Insufficient documentation

## 2024-08-24 NOTE — Progress Notes (Signed)
 " Cardiology Office Note:   Date:  08/25/2024  ID:  Paula [name] , DOB 1946/01/08, MRN [id number] PCP: Cleotilde Passy, MD  [facility] Providers Cardiologist:  Lynwood Schilling, MD {  History of Present Illness:   Paula [name]  is a 79 y.o. 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 Dolphus,  hypertension, hyperlipidemia, type 2 diabetes mellitus, ESRD on hemodialysis T/Th/Sat, and GERD.   She has a long history of palpitations.  She has been found in the past to have atrial fibrillation.  She has a history of heart failure preserved ejection fraction with a Myoview  in 2022 showing no evidence of any ischemia or infarct.  PYP scan was to be ordered but it was never done.  Her last echo in Jan 2025 demonstrated an EF of 60 to 65% with no significant valvular abnormalities.  She does have severe concentric left ventricular urgency.  She was hospitalized in January 2025 with community-acquired pneumonia and respiratory failure.  She was hospitalized most recently in March of this year and I reviewed these records.  She had respiratory failure.  She was sent home with with oxygen which she uses as needed.  She was seen by pulmonary. She is reported to have something called upper airway cough syndrome.    She was in the emergency room last week and I reviewed these records.  She interestingly had troponins that were elevated 242 and 247.  She had no acute ST segment elevation but I do note that she had new QT prolongation with new diffuse T wave inversion.  She had a markedly elevated BNP.  She comes back for follow-up of this.  She was told that she needed to take her  oxygen which she was using as needed now all the time.  She is not having any new chest pressure, neck or arm discomfort.  She still has a very significant cough.  She is not having any new edema.  She has her weight and volume managed at dialysis and she says she is able to complete dialysis but her blood pressure runs low episodically.  She has to take midodrine  at times before dialysis.  ROS:  As stated in the HPI and negative for all other systems.  Studies Reviewed:    EKG:   EKG Interpretation Date/Time:  Thursday August 25 2024 13:03:41 EST Ventricular Rate:  80 PR Interval:  256 QRS Duration:  74 QT Interval:  396 QTC Calculation: 456 R Axis:   -8  Text Interpretation: Sinus rhythm with 1st degree A-V block Nonspecific T wave abnormality When compared with ECG of 17-Aug-2024 17:26, Sinus rhythm has replaced Atrial fibrillation Nonspecific T wave abnormality has replaced inverted T waves in Inferior leads Nonspecific T wave abnormality has replaced inverted T waves in Anterior leads QT has shortened Confirmed by Schilling Rattan ([id number]) on 08/25/2024 1:05:57 PM    Risk Assessment/Calculations:    CHA2DS2-VASc Score = 5   This indicates a 7.2% annual risk of stroke. The patient's score is based upon: CHF History: 0 HTN History: 1 Diabetes History: 1 Stroke History: 0 Vascular Disease History: 0 Age Score: 2 Gender Score: 1   Physical Exam:   VS:  BP (!) 148/76   Pulse 85  Ht 5' 2 (1.575 m)   Wt 166 lb 12.8 oz (75.7 kg)   LMP  (LMP Unknown)   SpO2 95%   BMI 30.51 kg/m    Wt Readings from Last 3 Encounters:  08/25/24 166 lb 12.8 oz (75.7 kg)  08/17/24 165 lb 5.5 oz (75 kg)  06/30/24 165 lb 9.6 oz (75.1 kg)     GEN: Well nourished, well developed in no acute distress NECK: No JVD; No carotid bruits CARDIAC: RRR, no murmurs, rubs, gallops RESPIRATORY:  Upper airway wheezing, diffuse coarse rhonchi ABDOMEN: Soft, non-tender, non-distended EXTREMITIES:  No edema;  No deformity   ASSESSMENT AND PLAN:   Coronary Artery Calcification:   The patient had a negative perfusion study in 2022.  I am going to have a low threshold for ischemia workup but I am going to start with an echocardiogram given the markedly abnormal EKG.     Chronic HFpEF: She has had severe LVH.  PYP was not suggestive of amyloid.  I am going to repeat an echocardiogram given her increased oxygen demands and abnormal EKG with troponins that were elevated in the ER.  She is not having any active ischemic symptoms or ST segment elevation.  I have a low threshold for perfusion imaging pending these results.   Paroxysmal atrial fibrillation: She was in fibrillation during the ER evaluation but is in sinus now.  She really would not be a candidate for ablation.  She has good rate control.  She tolerates anticoagulation.  No change in therapy.  I am going to check a 2-week monitor to see how much fibrillation she is doing.  It may be that the burden has increased and this may be contributing to some of her symptoms though unlikely.   Hypertension: Her blood pressure is often running low.  Of asked her to take her midodrine  every morning before dialysis as she is having some trouble with low blood pressure and she feels really poorly when it drops down.    Type 2 Diabetes: Hemoglobin A1c was 6.7.  No change in therapy.  This is lower than previous.  ESRD on Hemodialysis:   She is on hemodialysis as above.  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  She has been followed by interventional neurology.  Hypoxemia: This will be evaluated as above.  Cough: She has a chronic cough and has been evaluated by ENT.  I would also like to refer her to pulmonary.     Follow up APP in about 3 months.  We will move this up pending the results above.  Signed, Lynwood Schilling, MD   "

## 2024-08-25 ENCOUNTER — Ambulatory Visit

## 2024-08-25 ENCOUNTER — Ambulatory Visit: Attending: Cardiology | Admitting: Cardiology

## 2024-08-25 ENCOUNTER — Encounter: Payer: Self-pay | Admitting: Cardiology

## 2024-08-25 VITALS — BP 148/76 | HR 85 | Ht 62.0 in | Wt 166.8 lb

## 2024-08-25 DIAGNOSIS — I10 Essential (primary) hypertension: Secondary | ICD-10-CM | POA: Diagnosis not present

## 2024-08-25 DIAGNOSIS — R059 Cough, unspecified: Secondary | ICD-10-CM

## 2024-08-25 DIAGNOSIS — I480 Paroxysmal atrial fibrillation: Secondary | ICD-10-CM | POA: Diagnosis not present

## 2024-08-25 DIAGNOSIS — I517 Cardiomegaly: Secondary | ICD-10-CM | POA: Diagnosis not present

## 2024-08-25 DIAGNOSIS — R0602 Shortness of breath: Secondary | ICD-10-CM | POA: Diagnosis not present

## 2024-08-25 NOTE — Patient Instructions (Signed)
 Medication Instructions:  Your physician recommends that you continue on your current medications as directed. Please refer to the Current Medication list given to you today.  *If you need a refill on your cardiac medications before your next appointment, please call your pharmacy*  Lab Work: NONE If you have labs (blood work) drawn today and your tests are completely normal, you will receive your results only by: MyChart Message (if you have MyChart) OR A paper copy in the mail If you have any lab test that is abnormal or we need to change your treatment, we will call you to review the results.  Testing/Procedures: Echocardiogram Your physician has requested that you have an echocardiogram. Echocardiography is a painless test that uses sound waves to create images of your heart. It provides your doctor with information about the size and shape of your heart and how well your hearts chambers and valves are working. This procedure takes approximately one hour. There are no restrictions for this procedure. Please do NOT wear cologne, perfume, aftershave, or lotions (deodorant is allowed). Please arrive 15 minutes prior to your appointment time.  Please note: We ask at that you not bring children with you during ultrasound (echo/ vascular) testing. Due to room size and safety concerns, children are not allowed in the ultrasound rooms during exams. Our front office staff cannot provide observation of children in our lobby area while testing is being conducted. An adult accompanying a patient to their appointment will only be allowed in the ultrasound room at the discretion of the ultrasound technician under special circumstances. We apologize for any inconvenience.  2 week Zio Heart Monitor Your physician has requested that you wear a Zio heart monitor for __14___ days. This will be mailed to your home with instructions on how to apply the monitor and how to return it when finished. Please allow 2  weeks after returning the heart monitor before our office calls you with the results.   Follow-Up: At [facility], you and your health needs are our priority.  As part of our continuing mission to provide you with exceptional heart care, our providers are all part of one team.  This team includes your primary Cardiologist (physician) and Advanced Practice Providers or APPs (Physician Assistants and Nurse Practitioners) who all work together to provide you with the care you need, when you need it.  Your next appointment:   3 month(s)  Provider:   One of our Advanced Practice Providers (APPs): Morse Clause, PA-C  Lamarr Satterfield, NP Miriam Shams, NP  Olivia Pavy, PA-C Josefa Beauvais, NP  Leontine Salen, PA-C Orren Fabry, PA-C  Hao Meng, PA-C Ernest Dick, NP  Damien Braver, NP Jon Hails, PA-C  Waddell Donath, PA-C    Dayna Dunn, PA-C  Scott Weaver, PA-C Lum Louis, NP Katlyn West, NP Callie Goodrich, PA-C  Xika Zhao, NP Sheng Haley, PA-C    Kathleen Johnson, PA-C    We recommend signing up for the patient portal called MyChart.  Sign up information is provided on this After Visit Summary.  MyChart is used to connect with patients for Virtual Visits (Telemedicine).  Patients are able to view lab/test results, encounter notes, upcoming appointments, etc.  Non-urgent messages can be sent to your provider as well.   To learn more about what you can do with MyChart, go to [email or web address].   Other Instructions Referral to pulmonary: Someone will reach out to you to make an appointment.  ZIO XT- Long Term Monitor Instructions  Your physician has requested you wear a ZIO patch monitor for 14 days.  This is a single patch monitor. [employer] supplies one patch monitor per enrollment. Additional stickers are not available. Please do not apply patch if you will be having a Nuclear Stress Test,  Echocardiogram, Cardiac CT, MRI, or Chest Xray during the period you would  be wearing the  monitor. The patch cannot be worn during these tests. You cannot remove and re-apply the  ZIO XT patch monitor.  Your ZIO patch monitor will be mailed 3 day USPS to your address on file. It may take 3-5 days  to receive your monitor after you have been enrolled.  Once you have received your monitor, please review the enclosed instructions. Your monitor  has already been registered assigning a specific monitor serial # to you.  Billing and Patient Assistance Program Information  We have supplied [employer] with any of your insurance information on file for billing purposes. [employer] offers a sliding scale Patient Assistance Program for patients that do not have  insurance, or whose insurance does not completely cover the cost of the ZIO monitor.  You must apply for the Patient Assistance Program to qualify for this discounted rate.  To apply, please call [employer] at [phone], select option 4, select option 2, ask to apply for  Patient Assistance Program. Meredeth will ask your household income, and how many people  are in your household. They will quote your out-of-pocket cost based on that information.  [employer] will also be able to set up a 13-month, interest-free payment plan if needed.  Applying the monitor   Shave hair from upper left chest.  Hold abrader disc by orange tab. Rub abrader in 40 strokes over the upper left chest as  indicated in your monitor instructions.  Clean area with 4 enclosed alcohol  pads. Let dry.  Apply patch as indicated in monitor instructions. Patch will be placed under collarbone on left  side of chest with arrow pointing upward.  Rub patch adhesive wings for 2 minutes. Remove white label marked 1. Remove the white  label marked 2. Rub patch adhesive wings for 2 additional minutes.  While looking in a mirror, press and release button in center of patch. A small green light will  flash 3-4 times. This will be your only indicator that the  monitor has been turned on.  Do not shower for the first 24 hours. You may shower after the first 24 hours.  Press the button if you feel a symptom. You will hear a small click. Record Date, Time and  Symptom in the Patient Logbook.  When you are ready to remove the patch, follow instructions on the last 2 pages of Patient  Logbook. Stick patch monitor onto the last page of Patient Logbook.  Place Patient Logbook in the blue and white box. Use locking tab on box and tape box closed  securely. The blue and white box has prepaid postage on it. Please place it in the mailbox as  soon as possible. Your physician should have your test results approximately 7 days after the  monitor has been mailed back to [facility].  Call [facility] Customer Care at [phone] if you have questions regarding  your ZIO XT patch monitor. Call them immediately if you see an orange light blinking on your  monitor.  If your monitor falls off in less than 4 days, contact our Monitor department at [phone].  If your monitor becomes loose or falls off  after 4 days call [employer] at [phone] for  suggestions on securing your monitor

## 2024-08-25 NOTE — Progress Notes (Unsigned)
 Enrolled for [employer] to mail a ZIO XT long term holter monitor to the patients address on file.

## 2024-08-30 ENCOUNTER — Encounter (HOSPITAL_COMMUNITY): Payer: Self-pay

## 2024-09-01 ENCOUNTER — Telehealth: Payer: Self-pay | Admitting: Family Medicine

## 2024-09-01 NOTE — Telephone Encounter (Signed)
 Copied from CRM #[id number]. Topic: General - Other >> Sep 01, 2024  8:56 AM   Paula [name] wrote:  Reason for CRM: patient calling to check to see if form has been completed? She dropped it off Tuesday. Please advise

## 2024-09-01 NOTE — Telephone Encounter (Signed)
 Form has been received and patient will be called once complete and ready for pick up.

## 2024-09-03 ENCOUNTER — Other Ambulatory Visit (HOSPITAL_COMMUNITY): Payer: Self-pay

## 2024-09-04 ENCOUNTER — Encounter (HOSPITAL_COMMUNITY): Payer: Self-pay

## 2024-09-04 ENCOUNTER — Other Ambulatory Visit (HOSPITAL_COMMUNITY): Payer: Self-pay

## 2024-09-04 ENCOUNTER — Other Ambulatory Visit: Payer: Self-pay

## 2024-09-05 ENCOUNTER — Telehealth: Payer: Self-pay | Admitting: Family Medicine

## 2024-09-05 ENCOUNTER — Other Ambulatory Visit: Payer: Self-pay

## 2024-09-05 MED ORDER — ATORVASTATIN CALCIUM 80 MG PO TABS
80.0000 mg | ORAL_TABLET | Freq: Every day | ORAL | 3 refills | Status: AC
  Filled 2024-09-05: qty 90, 90d supply, fill #0
  Filled 2024-09-07: qty 90, 90d supply, fill #1

## 2024-09-05 NOTE — Telephone Encounter (Signed)
 Contacted pt to resch appt pt also wanted to request refill on  Atorvastatin  Calcium  (Tab) LIPITOR 

## 2024-09-05 NOTE — Telephone Encounter (Signed)
 Copied from CRM [id number]. Topic: Clinical - Prescription Issue >> Sep 05, 2024  9:27 AM Willma R wrote: Reason for CRM: Patient calling in regards to her prescription for atorvastatin  (LIPITOR ) 80 MG tablet is showing discontinued. Patient is wondering why it was discontinued and if another medication was prescribed to replace it.  Patient can be reached at [phone]

## 2024-09-05 NOTE — Telephone Encounter (Signed)
 Routing to PCP for review.

## 2024-09-05 NOTE — Telephone Encounter (Signed)
 Attempted to contact patient and no answer. Sent MyChart message.

## 2024-09-05 NOTE — Telephone Encounter (Signed)
 Duplicate message. Medication has been sent

## 2024-09-05 NOTE — Telephone Encounter (Signed)
 Due to inclement weather, our office will be closed tomorrow 09/06/2024 . Well reach out to reschedule your appointment. Thank you.Please call [phone] ( if the pt calls back please resch appt )

## 2024-09-05 NOTE — Telephone Encounter (Signed)
 I have sent the Prescription to her Pharmacy. It looks like it was discontinued by one of the Staff but she needs to reman on it.

## 2024-09-06 ENCOUNTER — Ambulatory Visit: Admitting: Family Medicine

## 2024-09-07 ENCOUNTER — Telehealth: Payer: Self-pay

## 2024-09-07 ENCOUNTER — Other Ambulatory Visit: Payer: Self-pay

## 2024-09-07 NOTE — Telephone Encounter (Signed)
 Patient's daughter was called and informed that dental form has been faxed and copy has been placed up front for pick up.

## 2024-09-15 ENCOUNTER — Encounter: Payer: Self-pay | Admitting: Family Medicine

## 2024-09-15 ENCOUNTER — Encounter (HOSPITAL_COMMUNITY): Payer: Self-pay

## 2024-09-15 ENCOUNTER — Ambulatory Visit: Admitting: Family Medicine

## 2024-09-15 ENCOUNTER — Other Ambulatory Visit: Payer: Self-pay

## 2024-09-15 ENCOUNTER — Other Ambulatory Visit (HOSPITAL_COMMUNITY): Payer: Self-pay

## 2024-09-15 VITALS — BP 175/71 | HR 82 | Temp 98.5°F | Ht 62.0 in | Wt 173.4 lb

## 2024-09-15 DIAGNOSIS — N185 Chronic kidney disease, stage 5: Secondary | ICD-10-CM

## 2024-09-15 DIAGNOSIS — Z992 Dependence on renal dialysis: Secondary | ICD-10-CM

## 2024-09-15 DIAGNOSIS — J9610 Chronic respiratory failure, unspecified whether with hypoxia or hypercapnia: Secondary | ICD-10-CM | POA: Insufficient documentation

## 2024-09-15 DIAGNOSIS — E1122 Type 2 diabetes mellitus with diabetic chronic kidney disease: Secondary | ICD-10-CM

## 2024-09-15 DIAGNOSIS — N186 End stage renal disease: Secondary | ICD-10-CM

## 2024-09-15 DIAGNOSIS — E1149 Type 2 diabetes mellitus with other diabetic neurological complication: Secondary | ICD-10-CM

## 2024-09-15 DIAGNOSIS — I120 Hypertensive chronic kidney disease with stage 5 chronic kidney disease or end stage renal disease: Secondary | ICD-10-CM

## 2024-09-15 DIAGNOSIS — R269 Unspecified abnormalities of gait and mobility: Secondary | ICD-10-CM

## 2024-09-15 DIAGNOSIS — I480 Paroxysmal atrial fibrillation: Secondary | ICD-10-CM

## 2024-09-15 DIAGNOSIS — R058 Other specified cough: Secondary | ICD-10-CM

## 2024-09-15 DIAGNOSIS — Z7984 Long term (current) use of oral hypoglycemic drugs: Secondary | ICD-10-CM

## 2024-09-15 DIAGNOSIS — J9611 Chronic respiratory failure with hypoxia: Secondary | ICD-10-CM

## 2024-09-15 LAB — POCT GLYCOSYLATED HEMOGLOBIN (HGB A1C): HbA1c, POC (controlled diabetic range): 5.6 % (ref 0.0–7.0)

## 2024-09-15 MED ORDER — APIXABAN 5 MG PO TABS
5.0000 mg | ORAL_TABLET | Freq: Two times a day (BID) | ORAL | 1 refills | Status: AC
  Filled 2024-09-15: qty 180, 90d supply, fill #0

## 2024-09-15 NOTE — Patient Instructions (Signed)
 VISIT SUMMARY:  During your visit, we discussed your blood pressure issues during dialysis, diabetes management, respiratory health, and mobility difficulties. We also addressed your interest in a therapy pet.  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 We will continue your midodrine  on dialysis days, taking it before dialysis starts. We will ensure communication between dialysis staff and your cardiologist regarding blood pressure management. Please monitor your blood pressure closely during dialysis sessions and rest and sit after dialysis to prevent dizziness.  -TYPE 2 DIABETES MELLITUS WITH NEUROLOGIC COMPLICATION: Your diabetes is well-controlled with an A1c of 5.6, but there are concerns about low blood sugar. We have discontinued glipizide  and instructed you to monitor your blood glucose levels closely. Please continue taking Tradjenta  and monitor your blood glucose levels for one to two weeks. Send your blood glucose readings via MyChart for review.  -CHRONIC RESPIRATORY FAILURE WITH HYPOXIA: Your oxygen saturation is maintained at 2 liters. You had a recent ER visit for difficulty breathing after dialysis, but your oxygen levels were normal. We have scheduled an appointment with a pulmonologist for further evaluation. Please continue your current oxygen therapy at 2 liters.  -ABNORMAL GAIT AND MOBILITY: You have difficulty with walking and standing, likely related to your overall health status and the effects of dialysis. We have referred you to physical therapy to help improve your gait and mobility.  INSTRUCTIONS:  Please follow up with your pulmonologist as scheduled. Monitor your blood glucose levels for one to two weeks and send the readings via MyChart. Continue to monitor your blood pressure closely during dialysis sessions and rest  after dialysis to prevent dizziness.

## 2024-09-15 NOTE — Progress Notes (Signed)
 "  Subjective:  Patient ID: Paula [name] , female    DOB: 1946/04/04  Age: 79 y.o. MRN: [id number]  CC: Medical Management of Chronic Issues (BP dropping during dialysis/Referral to PT )     Discussed the use of AI scribe software for clinical note transcription with the patient, who gave verbal consent to proceed.  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 Accompanied by daughter to today's visit.  She has marked difficulty with ambulation and upright posture and cannot stand for long, and she is requesting a physical therapy referral for this.  Her blood pressure is highly labile, especially around dialysis. Morning readings can reach 200 mmHg systolic and sometimes prevent her from taking midodrine  at home. During dialysis her blood pressure often drops, at times as low as 54 mmHg per patient, and she feels ill when it falls, with symptoms improving when it is closer to 140-150 mmHg.  Her daughter interjects and states the patient complains of feeling ill when her blood pressures are below 140 systolic.  At the dialysis center she takes midodrine  there rather than at home per her cardiologist instruction.  About two hours into dialysis she develops nausea and dizziness and describes feeling ill. She uses a prescribed anti-nausea medication-Zofran   Last cardiology visit was 2 weeks ago after an ED visit for fluid overload.  She was placed on  Holter monitor for 2 weeks to monitor her paroxysmal A-fib.   Her diabetes control is very tight with a recent A1c of 5.6 while on Tradjenta  and glipizide .  She is currently under endocrine care but does not want to continue with Dr.Thappa.  She has chronic cough from upper airway cough syndrome and uses nasal rinses and sprays from ENT with improved respiratory symptoms. She has not seen pulmonology for almost a year.  She is interested in a therapy pet to help her feel calmer and more engaged.  She would like a letter from me so she does not have to pay the pet fee required by her apartment.  She has no documented anxiety or depression and is not on psychiatric medication.    Past Medical History:  Diagnosis Date   Abnormal auditory perception of both ears 02/19/2023   Abnormal electrocardiogram (ECG) (EKG) 12/29/2023   Acute metabolic encephalopathy 09/02/2017   Allergic rhinitis    Allergic rhinitis 08/20/2017   Allergy  1/12000   Anemia due to chronic kidney disease 10/11/2023   Aortic atherosclerosis 06/24/2021   Arthritis    Asthma    Bilateral impacted cerumen 11/26/2023   Blurry vision 10/11/2023   Brain aneurysm    CHF (congestive heart failure) (HCC) 10/12/2023   Chronic rhinitis 10/01/2017   Coronary artery disease involving native coronary artery of native heart 08/23/2024   Cough    Diabetes mellitus    Diastolic CHF, chronic (HCC) 10/11/2011   Ear canal dryness, bilateral 01/29/2023   ESRD on hemodialysis (HCC)    MWF   GERD (gastroesophageal reflux disease)    Gout 01/23/2016   History of  cataract 02/27/2015   History of colon polyps 2012   tubular adenoma    Hyperglycemia due to type 2 diabetes mellitus (HCC) 08/23/2024   Hyperlipidemia    Hypertension    Hypertrophy of nasal turbinates 09/05/2021   Hypoxemia 12/29/2023   ILD (interstitial lung disease) (HCC) 08/14/2022   Left ventricular hypertrophy 12/29/2023   Neuropathy 10/11/2011   Paroxysmal A-fib  (HCC) 05/20/2022   PNA (pneumonia) 08/17/2023   PONV (postoperative nausea and vomiting)    one time after lymph node surgery   Seasonal and perennial allergic rhinitis 02/15/2012   Nasal congestion and sneeze improved with nasal steroid.     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   Type 2 diabetes mellitus without complication, without long-term current use of insulin  (HCC) 07/03/2010   Qualifier: Diagnosis of   By: Wynetta REYNOLDS Ivanoff          Past Surgical History:  Procedure Laterality Date   AV FISTULA PLACEMENT Right 10/15/2021   Procedure: ARTERIOVENOUS (AV) FISTULA  WITH PLACEMENT OF GORE-TEX STRETCH  GRAFT (4-39mmx45cm);  Surgeon: Eliza Lonni RAMAN, MD;  Location: [facility] OR;  Service: Vascular;  Laterality: Right;   CATARACT EXTRACTION     right eye   IR 3D INDEPENDENT WKST  12/14/2017   IR ANGIO INTRA EXTRACRAN SEL COM CAROTID INNOMINATE BILAT MOD SED  09/15/2017   IR ANGIO INTRA EXTRACRAN SEL COM CAROTID INNOMINATE UNI L MOD SED  12/14/2017   IR ANGIO VERTEBRAL SEL VERTEBRAL BILAT MOD SED  09/15/2017   IR RADIOLOGIST EVAL & MGMT  09/10/2017   IR RADIOLOGIST EVAL & MGMT  10/19/2017   lymphatic mass surgery     NASAL TURBINATE REDUCTION     RADIOLOGY WITH ANESTHESIA N/A 12/14/2017   Procedure: RADIOLOGY WITH ANESTHESIA EMBOLIZATION;  Surgeon: Dolphus Carrion, MD;  Location: [facility] OR;  Service: Radiology;  Laterality: N/A;   VIDEO BRONCHOSCOPY Bilateral 11/18/2017   Procedure: VIDEO BRONCHOSCOPY WITHOUT FLUORO;  Surgeon: Shelah Lamar RAMAN, MD;  Location: [facility] ENDOSCOPY;  Service: Cardiopulmonary;  Laterality: Bilateral;    Family History  Problem Relation Age of Onset   Hypertension Mother    Asthma Sister    Colon cancer Neg Hx    Allergic rhinitis Neg Hx    Eczema Neg Hx     Social History   Socioeconomic History   Marital status: Divorced    Spouse name: Not on file   Number of children: 5   Years  of education: Not on file   Highest education level: Master's degree (e.g., MA, MS, MEng, MEd, MSW, MBA)  Occupational History   Occupation: Unemployed  Tobacco Use   Smoking status: Never    Passive exposure: Never   Smokeless tobacco: Never  Vaping Use   Vaping status: Never Used  Substance and Sexual Activity   Alcohol  use: No   Drug use: No   Sexual activity: Not Currently    Birth control/protection: Post-menopausal, None  Other Topics Concern   Not on file  Social History Narrative   Lives alone.  5 children with two deceased.     Social Drivers of Health   Tobacco Use: Low Risk (09/15/2024)   Patient History    Smoking Tobacco Use: Never    Smokeless Tobacco Use: Never    Passive Exposure: Never  Financial Resource Strain: High Risk (03/01/2024)   Overall Financial Resource Strain (CARDIA)    Difficulty of Paying Living Expenses: Hard  Food Insecurity: Food  Insecurity Present (03/01/2024)   Epic    Worried About Programme Researcher, Broadcasting/film/video in the Last Year: Often true    Ran Out of Food in the Last Year: Often true  Transportation Needs: Unmet Transportation Needs (03/01/2024)   Epic    Lack of Transportation (Medical): Yes    Lack of Transportation (Non-Medical): Yes  Physical Activity: Insufficiently Active (03/01/2024)   Exercise Vital Sign    Days of Exercise per Week: 2 days    Minutes of Exercise per Session: 10 min  Stress: No Stress Concern Present (03/01/2024)   [employer] of Occupational Health - Occupational Stress Questionnaire    Feeling of Stress: Not at all  Social Connections: Unknown (03/01/2024)   Social Connection and Isolation Panel    Frequency of Communication with Friends and Family: More than three times a week    Frequency of Social Gatherings with Friends and Family: More than three times a week    Attends Religious Services: More than 4 times per year    Active Member of [employer] or Organizations: Patient declined    Attends Tax Inspector Meetings: Not on file    Marital Status: Widowed  Depression (PHQ2-9): Low Risk (12/15/2023)   Depression (PHQ2-9)    PHQ-2 Score: 0  Alcohol  Screen: Low Risk (03/01/2024)   Alcohol  Screen    Last Alcohol  Screening Score (AUDIT): 0  Housing: High Risk (03/01/2024)   Epic    Unable to Pay for Housing in the Last Year: Yes    Number of Times Moved in the Last Year: 0    Homeless in the Last Year: No  Utilities: At Risk (10/11/2023)   AHC Utilities    Threatened with loss of utilities: Yes  Health Literacy: Adequate Health Literacy (09/15/2023)   B1300 Health Literacy    Frequency of need for help with medical instructions: Rarely    Allergies[1]  Outpatient Medications Prior to Visit  Medication Sig Dispense Refill   ACCU-CHEK GUIDE TEST test strip USE 1 STRIP TO CHECK GLUCOSE TWICE DAILY 100 each 0   acetaminophen  (TYLENOL ) 500 MG tablet Take 500 mg by mouth every 6 (six) hours as needed for fever, moderate pain or mild pain.     albuterol  (PROAIR  HFA) 108 (90 Base) MCG/ACT inhaler Inhale 1-2 puffs into the lungs every 6 (six) hours as needed for wheezing or shortness of breath. 6.7 g 2   albuterol  (PROVENTIL ) (2.5 MG/3ML) 0.083% nebulizer solution Take 3 mLs (2.5 mg total) by nebulization every 6 (six) hours as needed for wheezing or shortness of breath. 75 mL 1   atorvastatin  (LIPITOR ) 80 MG tablet Take 1 tablet (80 mg total) by mouth daily. 90 tablet 3   calcium  acetate (PHOSLO ) 667 MG capsule Take 1 capsule (667 mg total) by mouth 3 (three) times daily with meals. 270 capsule 2   carvedilol  (COREG ) 3.125 MG tablet Take 1 tablet (3.125 mg total) by mouth 2 (two) times daily. 180 tablet 3   furosemide  (LASIX ) 20 MG tablet Take 3 tablets (60 mg total) by mouth daily. 270 tablet 3   gabapentin  (NEURONTIN ) 100 MG capsule Take 1 capsule (100 mg total) by mouth 3 (three) times daily. 270 capsule 1   glipiZIDE  (GLUCOTROL  XL) 2.5 MG 24 hr tablet Take 1 tablet (2.5 mg total) by mouth  daily with breakfast. 90 tablet 3   Homeopathic Products (EARACHE DROPS OT) Place 1 drop into both ears daily as needed (for ear aches).  ipratropium (ATROVENT ) 0.03 % nasal spray Place 2 sprays into both nostrils every 12 (twelve) hours. 30 mL 12   isosorbide  mononitrate (IMDUR ) 30 MG 24 hr tablet Take 1 tablet (30 mg total) by mouth at bedtime. Do not take on dialysis days. 90 tablet 3   lidocaine -prilocaine  (EMLA ) cream Apply a small amount to dialysis access one hour prior to dialysis. Wrap with plastic wrap. 30 g PRN   linagliptin  (TRADJENTA ) 5 MG TABS tablet Take 1 tablet (5 mg total) by mouth daily. 90 tablet 3   midodrine  (PROAMATINE ) 10 MG tablet Take 1 tablet (10 mg total) by mouth 3 (three) times a week. Take 30 minutes prior to HD 15 tablet 3   Misc. Devices [employer]  Diagnosis - unstable gait 1 each 0   Misc. Devices MISC Fistula sleeve for R arm.  Diagnoses: End-stage renal disease on hemodialysis 1 each 0   Misc. Devices MISC 1. Incontinence supplies.  2. Briefs.  Diagnosis-urinary incontinence 1 each 11   Misc. Devices MISC Nepro dialysis protein drink.  Diagnosis end-stage renal disease (Patient taking differently: Take 10 g by mouth See admin instructions. Nepro dialysis protein drink (Diagnosis end-stage renal disease)- Mix 10 grams of powder and mix into food daily as needed for low protein) 30 each 11   Misc. Devices MISC Blood pressure monitor.  Diagnosis hypertension 1 each 0   Misc. Devices MISC Portable oxygen concentrator, 2 L of oxygen.  Diagnosis chronic respiratory failure. 1 each 0   olopatadine  (PATANOL) 0.1 % ophthalmic solution Place 1 drop into both eyes 2 (two) times daily. 5 mL 12   ondansetron  (ZOFRAN ) 4 MG tablet Take 4 mg by mouth every 8 (eight) hours as needed for nausea or vomiting.     Throat Lozenges (HALLS COUGH DROPS MT) Use as directed 1 lozenge in the mouth or throat every 2 (two) hours as needed (for coughing).     apixaban  (ELIQUIS ) 5 MG TABS  tablet Take 1 tablet (5 mg total) by mouth 2 (two) times daily. 180 tablet 1   No facility-administered medications prior to visit.     ROS Review of Systems  Constitutional:  Positive for activity change. Negative for appetite change.  HENT:  Negative for sinus pressure and sore throat.   Respiratory:  Positive for cough and shortness of breath. Negative for chest tightness and wheezing.   Cardiovascular:  Negative for chest pain and palpitations.  Gastrointestinal:  Negative for abdominal distention, abdominal pain and constipation.  Genitourinary: Negative.   Musculoskeletal: Negative.   Psychiatric/Behavioral:  Negative for behavioral problems and dysphoric mood.        Positive for anxiety    Objective:  BP (!) 175/71   Pulse 82   Temp 98.5 F (36.9 C) (Oral)   Ht 5' 2 (1.575 m)   Wt 173 lb 6.4 oz (78.7 kg)   LMP  (LMP Unknown)   SpO2 92%   BMI 31.72 kg/m      09/15/2024   11:29 AM 09/15/2024   10:48 AM 08/25/2024   12:17 PM  BP/Weight  Systolic BP 175 180 148  Diastolic BP 71 66 76  Wt. (Lbs)  173.4 166.8  BMI  31.72 kg/m2 30.51 kg/m2      Physical Exam Constitutional:      Appearance: She is well-developed.  HENT:     Nose:     Comments: On 2 L oxygen via nasal cannula Cardiovascular:     Rate and Rhythm: Normal  rate.     Heart sounds: Normal heart sounds. No murmur heard. Pulmonary:     Effort: Pulmonary effort is normal.     Breath sounds: Normal breath sounds. No wheezing or rales.  Chest:     Chest wall: No tenderness.  Abdominal:     General: Bowel sounds are normal. There is no distension.     Palpations: Abdomen is soft. There is no mass.     Tenderness: There is no abdominal tenderness.  Musculoskeletal:        General: Normal range of motion.     Right lower leg: No edema.     Left lower leg: No edema.  Neurological:     Mental Status: She is alert and oriented to person, place, and time.  Psychiatric:        Mood and Affect: Mood  normal.        Latest Ref Rng & Units 08/17/2024    4:44 PM 04/15/2024    7:12 PM 03/29/2024    3:16 PM  CMP  Glucose 70 - 99 mg/dL 835  797  886   BUN 8 - 23 mg/dL 20  31  61   Creatinine 0.44 - 1.00 mg/dL 6.26  4.96  2.23   Sodium 135 - 145 mmol/L 136  132  131   Potassium 3.5 - 5.1 mmol/L 3.0  4.0  4.6   Chloride 98 - 111 mmol/L 95  92  92   CO2 22 - 32 mmol/L 29  27  25    Calcium  8.9 - 10.3 mg/dL 8.4  8.3  8.5   Total Protein 6.5 - 8.1 g/dL 7.3   7.1   Total Bilirubin 0.0 - 1.2 mg/dL 0.9   0.9   Alkaline Phos 38 - 126 U/L 137   82   AST 15 - 41 U/L 33   17   ALT 0 - 44 U/L 24   14     Lipid Panel     Component Value Date/Time   CHOL 106 09/01/2021 1041   CHOL 121 06/17/2021 0904   TRIG 61 09/01/2021 1041   HDL 46 09/01/2021 1041   HDL 46 06/17/2021 0904   CHOLHDL 2.3 09/01/2021 1041   VLDL 12 09/01/2021 1041   LDLCALC 48 09/01/2021 1041   LDLCALC 61 06/17/2021 0904    CBC    Component Value Date/Time   WBC 8.2 08/17/2024 1644   RBC 2.86 (L) 08/17/2024 1644   HGB 8.9 (L) 08/17/2024 1644   HGB 12.4 09/02/2018 1211   HCT 26.0 (L) 08/17/2024 1644   HCT 37.3 09/02/2018 1211   PLT 134 (L) 08/17/2024 1644   PLT 203 09/02/2018 1211   MCV 90.9 08/17/2024 1644   MCV 87 09/02/2018 1211   MCH 31.1 08/17/2024 1644   MCHC 34.2 08/17/2024 1644   RDW 15.9 (H) 08/17/2024 1644   RDW 13.1 09/02/2018 1211   LYMPHSABS 0.8 08/17/2024 1644   LYMPHSABS 1.2 09/02/2018 1211   MONOABS 0.7 08/17/2024 1644   EOSABS 0.2 08/17/2024 1644   EOSABS 0.1 09/02/2018 1211   BASOSABS 0.0 08/17/2024 1644   BASOSABS 0.0 09/02/2018 1211    Lab Results  Component Value Date   HGBA1C 5.6 09/15/2024    Lab Results  Component Value Date   HGBA1C 5.6 09/15/2024   HGBA1C 6.7 03/01/2024   HGBA1C 7.4 (H) 08/17/2023       Assessment & Plan Hypertension associated with stage V chronic kidney disease secondary to type  2 diabetes mellitus Blood pressure fluctuates significantly during  dialysis.  She typically feels poorly when her blood pressure drops to systolic of 130 mmHg.  Advised to take her midodrine  just before dialysis to prevent symptoms.   - Continue midodrine  on dialysis days, taking it before dialysis starts. - Ensure communication between dialysis staff and cardiologist regarding blood pressure management. - Monitor blood pressure closely during dialysis sessions. - Rest and sit after dialysis to prevent dizziness.   End-stage renal disease on hemodialysis - Continue hemodialysis per schedule   Type 2 diabetes mellitus with neurologic complication Diabetes well-controlled with A1c of 5.6.  Fructosamine will be based on indicator of glycemic control.  Concerns about hypoglycemia due to low A1c and potential interaction with low blood pressure symptoms. Glipizide  may contribute to hypoglycemia. - Discontinued glipizide  and instructed to monitor blood glucose levels closely. - Continue Tradjenta  and monitor blood glucose levels for one to two weeks. - Send blood glucose readings via MyChart for review.  Chronic respiratory failure with hypoxia Oxygen saturation maintained at 2 liters.   Pulmonary follow-up needed. - Scheduled appointment with pulmonologist for further evaluation. - Continue current oxygen therapy at 2 liters.  Daughter states patient has complained of the oxygen being too dry.  Will send message to the case manager to coordinate with DME company regarding moisturized oxygen  Upper airway cough syndrome - Has ongoing cough which has improved - Advised to be consistent with regimen prescribed by ENT -She does need to follow-up with pulmonary and daughter promises to make appointment for her -Interstitial lung disease is mention in her chart but I do not see evidence of workup to confirm this diagnosis hence the need to follow-up with pulmonary to refute or confirm diagnosis   Abnormal gait and mobility Difficulty with walking and standing,  likely related to overall health status and dialysis effects. - Referred to physical therapy for gait and mobility improvement.   Paroxysmal A-fib Currently in sinus rhythm - Currently wearing Holter monitor - Continue Eliquis    Explored symptoms of anxiety with the patient but she is not forthcoming with answers and I have offered to refer her for counseling which she declined and she also declines medications.  Does not meet criteria for an emotional support animal.  She can speak with her apartment management and pay the pet fee required to keep her pet if she so desires.  Will have case manager look into resources for senior support and events.  Meds ordered this encounter  Medications   apixaban  (ELIQUIS ) 5 MG TABS tablet    Sig: Take 1 tablet (5 mg total) by mouth 2 (two) times daily.    Dispense:  180 tablet    Refill:  1    Follow-up: Return in about 6 months (around 03/15/2025) for Medical conditions with PCP; place on LPN schedule for AWV.       Corrina Sabin, MD, FAAFP. [facility] [place], [place] [phone]   09/15/2024, 1:23 PM    [1]  Allergies Allergen Reactions   Dust Mite Extract Cough   Ace Inhibitors Cough   Insulins     Reports fainting, intolerance   Lovastatin Other (See Comments)    Generalized body pain  Other Reaction(s): body pain   "

## 2024-09-16 ENCOUNTER — Telehealth: Payer: Self-pay

## 2024-09-16 NOTE — Telephone Encounter (Signed)
 Okay with me

## 2024-09-16 NOTE — Telephone Encounter (Signed)
 Copied from CRM [id number]. Topic: Appointments - Transfer of Care >> Sep 16, 2024 11:49 AM Ismael A wrote: Pt is requesting to transfer FROM: Dr. Jessee Lites Pt is requesting to transfer TO: Dr. Theophilus Reason for requested transfer: referral It is the responsibility of the team the patient would like to transfer to (Dr. Theophilus ) to reach out to the patient if for any reason this transfer is not acceptable.   Please advise pt would like to switch from Dr. Shelah to Dr. Theophilus

## 2024-09-16 NOTE — Telephone Encounter (Signed)
Yes this is okay with me.  Thank you.

## 2024-09-22 ENCOUNTER — Ambulatory Visit (HOSPITAL_COMMUNITY): Admission: RE | Admit: 2024-09-22 | Source: Ambulatory Visit

## 2024-10-13 ENCOUNTER — Encounter: Admitting: Pulmonary Disease

## 2024-11-24 ENCOUNTER — Ambulatory Visit: Admitting: Emergency Medicine
